# Patient Record
Sex: Female | Born: 1945 | Race: White | Hispanic: No | Marital: Married | Smoking: Never smoker
Health system: Southern US, Community
[De-identification: ages and names within clinical notes are randomized; demographics above are authoritative.]

## PROBLEM LIST (undated history)

## (undated) DIAGNOSIS — F419 Anxiety disorder, unspecified: Secondary | ICD-10-CM

## (undated) DIAGNOSIS — G629 Polyneuropathy, unspecified: Secondary | ICD-10-CM

## (undated) DIAGNOSIS — M069 Rheumatoid arthritis, unspecified: Secondary | ICD-10-CM

## (undated) DIAGNOSIS — Z9289 Personal history of other medical treatment: Secondary | ICD-10-CM

## (undated) DIAGNOSIS — M549 Dorsalgia, unspecified: Secondary | ICD-10-CM

## (undated) DIAGNOSIS — R51 Headache: Secondary | ICD-10-CM

## (undated) DIAGNOSIS — G2581 Restless legs syndrome: Secondary | ICD-10-CM

## (undated) DIAGNOSIS — K589 Irritable bowel syndrome without diarrhea: Secondary | ICD-10-CM

## (undated) DIAGNOSIS — E876 Hypokalemia: Secondary | ICD-10-CM

## (undated) DIAGNOSIS — R531 Weakness: Secondary | ICD-10-CM

## (undated) DIAGNOSIS — G35 Multiple sclerosis: Secondary | ICD-10-CM

## (undated) DIAGNOSIS — K219 Gastro-esophageal reflux disease without esophagitis: Secondary | ICD-10-CM

## (undated) DIAGNOSIS — R002 Palpitations: Secondary | ICD-10-CM

## (undated) DIAGNOSIS — E271 Primary adrenocortical insufficiency: Secondary | ICD-10-CM

## (undated) DIAGNOSIS — R350 Frequency of micturition: Secondary | ICD-10-CM

## (undated) DIAGNOSIS — G8929 Other chronic pain: Secondary | ICD-10-CM

## (undated) DIAGNOSIS — R351 Nocturia: Secondary | ICD-10-CM

## (undated) DIAGNOSIS — Z8709 Personal history of other diseases of the respiratory system: Secondary | ICD-10-CM

## (undated) DIAGNOSIS — M1990 Unspecified osteoarthritis, unspecified site: Secondary | ICD-10-CM

## (undated) DIAGNOSIS — G4700 Insomnia, unspecified: Secondary | ICD-10-CM

## (undated) DIAGNOSIS — J302 Other seasonal allergic rhinitis: Secondary | ICD-10-CM

## (undated) DIAGNOSIS — I959 Hypotension, unspecified: Secondary | ICD-10-CM

## (undated) DIAGNOSIS — M797 Fibromyalgia: Secondary | ICD-10-CM

## (undated) DIAGNOSIS — D649 Anemia, unspecified: Secondary | ICD-10-CM

## (undated) DIAGNOSIS — R42 Dizziness and giddiness: Secondary | ICD-10-CM

## (undated) DIAGNOSIS — F32A Depression, unspecified: Secondary | ICD-10-CM

## (undated) DIAGNOSIS — M2550 Pain in unspecified joint: Secondary | ICD-10-CM

## (undated) DIAGNOSIS — F329 Major depressive disorder, single episode, unspecified: Secondary | ICD-10-CM

## (undated) DIAGNOSIS — R55 Syncope and collapse: Secondary | ICD-10-CM

## (undated) HISTORY — PX: FRACTURE SURGERY: SHX138

## (undated) HISTORY — PX: SPINAL CORD STIMULATOR IMPLANT: SHX2422

## (undated) HISTORY — DX: Syncope and collapse: R55

## (undated) HISTORY — PX: EYE SURGERY: SHX253

## (undated) HISTORY — PX: ESOPHAGOGASTRODUODENOSCOPY: SHX1529

## (undated) HISTORY — PX: COLONOSCOPY: SHX174

---

## 1986-02-07 HISTORY — PX: APPENDECTOMY: SHX54

## 1986-02-07 HISTORY — PX: ABDOMINAL HYSTERECTOMY: SHX81

## 1988-02-08 HISTORY — PX: COLECTOMY: SHX59

## 1995-02-08 HISTORY — PX: CHOLECYSTECTOMY: SHX55

## 1998-05-08 ENCOUNTER — Ambulatory Visit (HOSPITAL_BASED_OUTPATIENT_CLINIC_OR_DEPARTMENT_OTHER): Admission: RE | Admit: 1998-05-08 | Discharge: 1998-05-08 | Payer: Self-pay | Admitting: General Surgery

## 1998-08-28 ENCOUNTER — Encounter: Payer: Self-pay | Admitting: Gastroenterology

## 1998-08-28 ENCOUNTER — Ambulatory Visit (HOSPITAL_COMMUNITY): Admission: RE | Admit: 1998-08-28 | Discharge: 1998-08-28 | Payer: Self-pay | Admitting: Gastroenterology

## 1998-11-17 ENCOUNTER — Ambulatory Visit (HOSPITAL_COMMUNITY): Admission: RE | Admit: 1998-11-17 | Discharge: 1998-11-17 | Payer: Self-pay | Admitting: Neurology

## 1998-12-21 ENCOUNTER — Inpatient Hospital Stay (HOSPITAL_COMMUNITY): Admission: EM | Admit: 1998-12-21 | Discharge: 1998-12-25 | Payer: Self-pay | Admitting: Emergency Medicine

## 1998-12-21 ENCOUNTER — Encounter: Payer: Self-pay | Admitting: Emergency Medicine

## 1998-12-21 ENCOUNTER — Encounter: Payer: Self-pay | Admitting: Neurology

## 1998-12-22 ENCOUNTER — Encounter: Payer: Self-pay | Admitting: Neurology

## 1999-01-15 ENCOUNTER — Encounter: Payer: Self-pay | Admitting: *Deleted

## 1999-01-15 ENCOUNTER — Encounter: Admission: RE | Admit: 1999-01-15 | Discharge: 1999-01-15 | Payer: Self-pay | Admitting: *Deleted

## 1999-02-03 ENCOUNTER — Ambulatory Visit (HOSPITAL_COMMUNITY): Admission: RE | Admit: 1999-02-03 | Discharge: 1999-02-03 | Payer: Self-pay | Admitting: *Deleted

## 1999-02-03 ENCOUNTER — Encounter: Payer: Self-pay | Admitting: *Deleted

## 1999-02-04 ENCOUNTER — Encounter: Payer: Self-pay | Admitting: Internal Medicine

## 1999-02-04 ENCOUNTER — Inpatient Hospital Stay (HOSPITAL_COMMUNITY): Admission: EM | Admit: 1999-02-04 | Discharge: 1999-02-06 | Payer: Self-pay | Admitting: *Deleted

## 1999-02-18 ENCOUNTER — Encounter: Admission: RE | Admit: 1999-02-18 | Discharge: 1999-05-19 | Payer: Self-pay | Admitting: Neurology

## 1999-03-03 ENCOUNTER — Encounter: Admission: RE | Admit: 1999-03-03 | Discharge: 1999-06-01 | Payer: Self-pay | Admitting: Anesthesiology

## 1999-03-03 ENCOUNTER — Encounter: Payer: Self-pay | Admitting: Anesthesiology

## 1999-03-10 ENCOUNTER — Encounter: Payer: Self-pay | Admitting: Anesthesiology

## 1999-05-25 ENCOUNTER — Encounter: Payer: Self-pay | Admitting: Gastroenterology

## 1999-05-25 ENCOUNTER — Encounter: Admission: RE | Admit: 1999-05-25 | Discharge: 1999-05-25 | Payer: Self-pay | Admitting: Gastroenterology

## 1999-08-24 ENCOUNTER — Encounter: Payer: Self-pay | Admitting: Gastroenterology

## 1999-08-24 ENCOUNTER — Ambulatory Visit (HOSPITAL_COMMUNITY): Admission: RE | Admit: 1999-08-24 | Discharge: 1999-08-24 | Payer: Self-pay | Admitting: Gastroenterology

## 2000-01-17 ENCOUNTER — Encounter: Payer: Self-pay | Admitting: *Deleted

## 2000-01-17 ENCOUNTER — Encounter: Admission: RE | Admit: 2000-01-17 | Discharge: 2000-01-17 | Payer: Self-pay | Admitting: *Deleted

## 2000-01-17 ENCOUNTER — Other Ambulatory Visit: Admission: RE | Admit: 2000-01-17 | Discharge: 2000-01-17 | Payer: Self-pay | Admitting: *Deleted

## 2000-07-20 ENCOUNTER — Encounter: Payer: Self-pay | Admitting: Emergency Medicine

## 2000-07-20 ENCOUNTER — Encounter: Payer: Self-pay | Admitting: *Deleted

## 2000-07-20 ENCOUNTER — Inpatient Hospital Stay (HOSPITAL_COMMUNITY): Admission: EM | Admit: 2000-07-20 | Discharge: 2000-07-27 | Payer: Self-pay | Admitting: Emergency Medicine

## 2001-01-17 ENCOUNTER — Encounter: Payer: Self-pay | Admitting: *Deleted

## 2001-01-17 ENCOUNTER — Encounter: Admission: RE | Admit: 2001-01-17 | Discharge: 2001-01-17 | Payer: Self-pay | Admitting: *Deleted

## 2002-01-17 ENCOUNTER — Encounter: Admission: RE | Admit: 2002-01-17 | Discharge: 2002-01-17 | Payer: Self-pay | Admitting: Obstetrics and Gynecology

## 2002-01-17 ENCOUNTER — Encounter: Payer: Self-pay | Admitting: Obstetrics and Gynecology

## 2002-07-24 ENCOUNTER — Ambulatory Visit (HOSPITAL_COMMUNITY): Admission: RE | Admit: 2002-07-24 | Discharge: 2002-07-24 | Payer: Self-pay | Admitting: Neurology

## 2002-07-24 ENCOUNTER — Encounter: Payer: Self-pay | Admitting: Neurology

## 2003-01-21 ENCOUNTER — Encounter: Admission: RE | Admit: 2003-01-21 | Discharge: 2003-01-21 | Payer: Self-pay | Admitting: Obstetrics and Gynecology

## 2003-06-18 ENCOUNTER — Encounter (INDEPENDENT_AMBULATORY_CARE_PROVIDER_SITE_OTHER): Payer: Self-pay | Admitting: Specialist

## 2003-06-18 ENCOUNTER — Ambulatory Visit (HOSPITAL_COMMUNITY): Admission: RE | Admit: 2003-06-18 | Discharge: 2003-06-18 | Payer: Self-pay | Admitting: Gastroenterology

## 2003-06-24 ENCOUNTER — Ambulatory Visit (HOSPITAL_COMMUNITY): Admission: RE | Admit: 2003-06-24 | Discharge: 2003-06-24 | Payer: Self-pay | Admitting: Gastroenterology

## 2004-01-22 ENCOUNTER — Encounter: Admission: RE | Admit: 2004-01-22 | Discharge: 2004-01-22 | Payer: Self-pay | Admitting: Obstetrics and Gynecology

## 2004-05-30 ENCOUNTER — Emergency Department (HOSPITAL_COMMUNITY): Admission: EM | Admit: 2004-05-30 | Discharge: 2004-05-30 | Payer: Self-pay | Admitting: Emergency Medicine

## 2004-06-25 ENCOUNTER — Encounter: Admission: RE | Admit: 2004-06-25 | Discharge: 2004-06-25 | Payer: Self-pay | Admitting: Neurology

## 2005-02-01 ENCOUNTER — Encounter: Admission: RE | Admit: 2005-02-01 | Discharge: 2005-02-01 | Payer: Self-pay | Admitting: Obstetrics and Gynecology

## 2005-09-22 ENCOUNTER — Encounter: Admission: RE | Admit: 2005-09-22 | Discharge: 2005-09-22 | Payer: Self-pay | Admitting: Gastroenterology

## 2006-02-03 ENCOUNTER — Encounter: Admission: RE | Admit: 2006-02-03 | Discharge: 2006-02-03 | Payer: Self-pay | Admitting: Obstetrics and Gynecology

## 2006-02-10 ENCOUNTER — Ambulatory Visit (HOSPITAL_COMMUNITY): Admission: RE | Admit: 2006-02-10 | Discharge: 2006-02-10 | Payer: Self-pay | Admitting: Neurology

## 2006-06-22 ENCOUNTER — Inpatient Hospital Stay (HOSPITAL_COMMUNITY): Admission: RE | Admit: 2006-06-22 | Discharge: 2006-06-24 | Payer: Self-pay | Admitting: Orthopedic Surgery

## 2006-07-21 ENCOUNTER — Inpatient Hospital Stay (HOSPITAL_COMMUNITY): Admission: AD | Admit: 2006-07-21 | Discharge: 2006-07-24 | Payer: Self-pay | Admitting: Family Medicine

## 2006-07-21 ENCOUNTER — Ambulatory Visit: Payer: Self-pay | Admitting: Family Medicine

## 2007-03-13 ENCOUNTER — Encounter: Admission: RE | Admit: 2007-03-13 | Discharge: 2007-03-13 | Payer: Self-pay | Admitting: Obstetrics and Gynecology

## 2008-03-18 ENCOUNTER — Encounter: Admission: RE | Admit: 2008-03-18 | Discharge: 2008-03-18 | Payer: Self-pay | Admitting: Obstetrics and Gynecology

## 2008-03-24 ENCOUNTER — Encounter: Admission: RE | Admit: 2008-03-24 | Discharge: 2008-03-24 | Payer: Self-pay | Admitting: Obstetrics and Gynecology

## 2008-10-23 ENCOUNTER — Encounter: Admission: RE | Admit: 2008-10-23 | Discharge: 2008-10-23 | Payer: Self-pay | Admitting: Family Medicine

## 2009-03-26 ENCOUNTER — Encounter: Admission: RE | Admit: 2009-03-26 | Discharge: 2009-03-26 | Payer: Self-pay | Admitting: Obstetrics and Gynecology

## 2010-02-27 ENCOUNTER — Other Ambulatory Visit: Payer: Self-pay | Admitting: Obstetrics and Gynecology

## 2010-02-27 DIAGNOSIS — Z1239 Encounter for other screening for malignant neoplasm of breast: Secondary | ICD-10-CM

## 2010-03-10 ENCOUNTER — Ambulatory Visit: Payer: Self-pay

## 2010-04-13 ENCOUNTER — Ambulatory Visit
Admission: RE | Admit: 2010-04-13 | Discharge: 2010-04-13 | Disposition: A | Discharge status: Home / Self Care | Payer: Commercial Indemnity | Source: Ambulatory Visit | Attending: Obstetrics and Gynecology | Admitting: Obstetrics and Gynecology

## 2010-04-13 DIAGNOSIS — Z1239 Encounter for other screening for malignant neoplasm of breast: Secondary | ICD-10-CM

## 2010-06-22 NOTE — Op Note (Signed)
[name], [name]              ACCOUNT NO.:  [id number]   MEDICAL RECORD NO.:  [id number]          PATIENT TYPE:  INP   LOCATION:  5005                         FACILITY:  [facility]   PHYSICIAN:  Doralee Albino. Carola Frost, M.D. DATE OF BIRTH:  10-Sep-1945   DATE OF PROCEDURE:  06/22/2006  DATE OF DISCHARGE:                               OPERATIVE REPORT   PREOPERATIVE DIAGNOSIS:  Right tibia and fibula malunion/nonunion.   POSTOPERATIVE DIAGNOSIS:  Right tibia and fibula malunion/nonunion.   PROCEDURE:  Intramedullary nailing of the right tibia using a Synthes 10  x 285-mm statically locked nail with correction of the deformity.   SURGEON:  Doralee Albino. Carola Frost, M.D.   ASSISTANT:  Adrian Blackwater, RNFA.   ANESTHESIA:  General.   COMPLICATIONS:  None.   SPECIMENS:  None.   ESTIMATED BLOOD LOSS:  Minimal.   DISPOSITION:  To PACU.   CONDITION:  Stable.   BRIEF SUMMARY AND INDICATIONS FOR PROCEDURE:  Katrina Rivera is a 65-  year-old female with multiple sclerosis, who sustained a right tibia  fracture that was treated conservatively.  She went on to develop  persistent mobility at the fracture site, with over 13 degrees of  recurvatum deformity.  She also was having difficulty tolerating the  cast and strongly wished to pursue operative intervention for correction  of the deformity, as well as additional internal support of the fracture  to minimize her need for cast bracing.  She understood the risks to  include nerve injury, vessel injury, infection, malunion, nonunion, need  for further surgery, DVT, PE and others.  After a full discussion, she  wished to proceed.   BRIEF DESCRIPTION OF PROCEDURE:  Katrina Rivera was taken to the  operating room, where general anesthesia was induced.  Her right lower  extremity was prepped and draped in the usual sterile fashion.  This  patient has had problems with urinary retention previously.  She  requested a Foley, which was placed prior to the  prep and drape.  We  examined the tibia under anesthesia and did find some motion at the  fracture site.  This was used to correct the deformity and hold it  reduced, while a guide wire was placed through a 2-cm incision  proximally through the curved cannulated awl.  It was advanced in the  center-center position in the metaphysis.  Her bone quality was  exceedingly poor.  We reamed and then placed a 285-mm nail that was 10  mm in diameter.  We then placed a series of locking bolts, including 2  anterior to posterior and 1 medial to lateral.  We did not achieve a lot  of bending at the fibula, which appeared to be near united.  I  consequently did not place a flexible rod.  We did apply a posterior and  a stirrup splint following internal fixation and a standard layered  closure with 2-0 Vicryl and 3-0 nylon.  The patient seemed to tolerate  the procedure quite well.  She was taken to the PACU in stable  condition.   PROGNOSIS:  Katrina Rivera should do  well following the intramedullary  nailing of her right tibia, but I remain very concerned about the severe  osteopenia, which does increase her risk of loss of reduction, hardware  migration and subsequent malunion or nonunion.  She will be  nonweightbearing for the next 4 to 6 weeks, with graduated weightbearing  thereafter.  As soon as her wounds have adequately healed, she will be  allowed to transition into a cam boot.      Doralee Albino. Carola Frost, M.D.  Electronically Signed     MHH/MEDQ  D:  06/22/2006  T:  06/22/2006  Job:  [id number]

## 2010-06-22 NOTE — H&P (Signed)
NAMERAND, BOLLER              ACCOUNT NO.:  [id number]   MEDICAL RECORD NO.:  [id number]          PATIENT TYPE:  INP   LOCATION:  5732                         FACILITY:  [facility]   PHYSICIAN:  Santiago Bumpers. Hensel, M.D.DATE OF BIRTH:  [birth year]   DATE OF ADMISSION:  07/21/2006  DATE OF DISCHARGE:                              HISTORY & PHYSICAL   PRIMARY CARE PHYSICIAN:  [facility].  The patient's neurologist  is Dr. Sandria Manly.  The patient's gastroenterologist is Dr. Ewing Schlein.  Her  urologist is Dr. Vonita Moss.   CHIEF COMPLAINT:  Pyelonephritis, flank pain, nausea, vomiting.   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  She recently underwent IM nailing of her right tibia  for malunion from a previous fracture.  Since her surgery on May 15, she  has been having urinary symptoms such as dysuria, increased frequency  and hematuria.  She was seen by her urologist and placed on a 7-day  course of Bactrim.  She has continued to have urinary symptoms, but  recently developed fever, nausea, vomiting and low back pain in addition  to the urinary symptoms about 6-7 days ago.  She presented to [facility] on June 11, and was diagnosed with pyelonephritis.  She  received ceftriaxone x2 as well as oral Cipro, but has been unable to  keep any medications down.  She also gives a history of bloody diarrhea  for the past 7-10 days.  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  She does  have a history of diarrhea previously which she does note has some times  been bloody and is followed by Dr. Ewing Schlein.  Workup for etiology of her  bleeding has been negative so far.   REVIEW OF SYSTEMS:  No chest pain, shortness breath, rashes or bleeding  gums.  She does have  some lightheadedness when first sitting up.   PAST MEDICAL HISTORY:  1. Multiple sclerosis followed by Dr. Sandria Manly.  2. Urinary retention.  Of note, the patient does self-catheterize.  3. Hypotension.  4. GERD.  5. Iron deficiency anemia.  6. Insomnia.  7. Muscle spasms.  8. Postmenopausal symptoms.   PAST SURGICAL HISTORY:  1. Right tibia IM nailing on Jun 22, 2006.  2. Cholecystectomy.  3. Appendectomy.  4. Partial hysterectomy.  5. Total colectomy with ileoanal anastomosis.   MEDICATIONS:  1. Flomax 0.4 mg q.a.m.  2. Omeprazole 20 mg p.o. b.i.d.  3. Fludrocortisone 3 mg nightly.  4. KCl 20 mEq daily.  5. Trazodone 300 mg nightly.  6. Tizanidine 4 mg every 3 hours.  7. Premarin 0.625 mg q.a.m.  8. Calcium plus D t.i.d.   ALLERGIES:  IBUPROFEN caused GI bleed.Marland Kitchen   FAMILY HISTORY:  Mom alive with hypertension.  Dad died of unknown  causes.  Siblings are alive and well.   SOCIAL HISTORY:  She lives with her husband as  well as her elderly  mother.  She has been on Disability since her multiple sclerosis  diagnosis.  She has two sons.  She denies any alcohol, tobacco or other  drug use.   PHYSICAL EXAMINATION:  VITAL SIGNS:  Temperature 99.2, pulse 131, blood  pressure 188/102, respiratory 20, 100% on room air.  GENERAL:  Alert in no acute distress.  HEENT:  Mucous membranes are somewhat dry.  NECK:  No JVD.  No lymphadenopathy.  No carotid bruits.  CARDIOVASCULAR:  Regular rate and rhythm.  No murmurs, rubs or gallops.  LUNGS:  Clear to auscultation bilaterally.  ABDOMEN:  Positive bowel sounds, soft.  She does have positive CVA  tenderness bilaterally and is mildly tender to palpation over right  lower quadrant and left lower quadrant.  No rebound or guarding.  EXTREMITIES:  Right lower extremity with healing surgical scars and mild  edema and bruising of right foot.  NEUROLOGIC:  She is alert and x3.  She has a fine resting tremor 5/5  strength with cranial nerves 2-12  grossly intact.   LABORATORY DATA AND X-RAY FINDINGS:  From [facility], urine  culture shows gram-negative rods.  Speciation is pending.  White blood  cell count went from 15.5 on June 11, to 18.0 today, hemoglobin 10.4 on  June 11, 10.7 today.  Electrolytes on June 11, with sodium 136,  potassium 4.2, chloride 105, bicarb 16, BUN 19, creatinine 1.93, glucose  95.   ASSESSMENT/PLAN:  A 65 year old female with history of multiple  sclerosis, urinary retention, now with pyelonephritis and questionable  bloody diarrhea.   1. Complicated pyelonephritis.  The patient with history of urinary      retention with lack of improvement on 2 days of ceftriaxone Cipro.      Will start Zosyn for empiric antibiotic coverage and will await      final speciation from original culture.  We will also repeat      urinalysis and urine culture.  If the patient is not improving on      parenteral antibiotics, may consider CT scan.  2. Diarrhea.  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  Question if diarrhea is bloody.      Will heme check stools as well as check C. difficile and stool      cultures to look for infectious etiology.  If symptoms worsen will      start Flagyl and consult Dr. Ewing Schlein.  3. Anemia. She has history of iron deficiency anemia.  Will monitor      hemoglobin.  Check iron panels.  The patient currently not on iron      therapy.  Anticipate that her hemoglobin will drop with intravenous      fluids.  4. Fluid and electrolytes.  The patient clinically dehydrated.  Will      give 500 mL normal saline bolus with intravenous fluids at 75 mL an      hour.  Will check electrolytes and orthostatics.  5. Protonix while in the hospital.  Will give Protonix intravenous      while she is unable to tolerate p.o. 6. Hypotension.  The patient is currently hypertensive.   We will hold      her fludrocortisone and follow her blood      pressures.  7. Muscle spasms.  Will continue the patient on her home dose of      tizanidine.  8. Acute renal failure.  We will recheck the patient's creatinine      today.  Will place Foley catheter.      Benn Moulder, M.D.    ______________________________  Santiago Bumpers. Leveda Anna, M.D.    MR/MEDQ  D:  07/21/2006  T:  07/22/2006  Job:  [id number]

## 2010-06-25 NOTE — Consult Note (Signed)
[place]  Patient:    Katrina Rivera                      MRN: [id number] Proc. Date: 12/21/98 Adm. Date:  [id number] Attending:  Erich Montane                          Consultation Report  DATE OF BIRTH:  March 21, 1945  REASON FOR CONSULTATION:  This is a 65 year old, right-handed, Caucasian, married woman with chief complaint of delirium, aphasia, chorea, organic gait disorder, and a history of relapsing-remitting multiple sclerosis.  HISTORY OF PRESENT ILLNESS:  The patient has had chronic organic gait disorder ith falling requiring a walker. She was diagnosed with multiple sclerosis years ago and has probably not had an MRI scan for at least six or seven years. 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  The patient was seen November 09, 1998, with recurrent falls. Her examination showed evidence of a systolic hypertension. The patient had a fairly normal mental status but was slow to follow commands and required them to be repeated. She had evidence of ______ disks and choreiform movement in her hands and feet bilaterally.  The patient had laboratory studies carried out by Genene Churn. Love, M.D. which showed hypokalemia, mild increased liver functions, CPK of 509, related to her falls and pancytopenia.  The patient was seen by hematology and plans were made to carry out a bone marrow harvest. Subsequent testing showed recovery of the patients blood counts. Throwing into question whether or not, the CBC was a laboratory error.  The patient was seen again by Dr. Sandria Manly on November 6. Again the patient continued to have problems with chorea, with insomnia (sleeping four hours per day), an unexpected weight loss of 15 pounds. She again had evidence of examination that was very similar to that of October 2, including  chorea, some difficulty with mental status, if mild, but no aphasia, a wide-based gait, and nonfocal examination.  The patient had been taken off of Betaseron, because it was not effective. Plans were also made to slightly taper amitriptyline, discontinue Neurontin, taper Skelaxin, and slowly taper Xanax, discontinue Zyrtec, and her TENS unit.  The patient misunderstood the instructions. She is home alone much from 9 a.m. o 7 p.m., while her husband works.  He has observed that she has been agitated, pulling things out of drawers, stacking clean and dirty laundry together. She has lost her wedding band and other rings, her glasses. She tore up her checkbook. She is speaking nonsensically but seems to be able to follow commands. Her speech worsened in the four days prior to admission.  REVIEW OF SYSTEMS:  In addition, the patients review of systems is remarkable for low-grade fever without focal infection. The patient was able to walk in from the parking lot without her walker despite her unsteadiness. She has had ongoing problems with insomnia, chorea, systolic hypertension. Review of systems is otherwise negative.  CURRENT MEDICATIONS:  Should include: 1. Baclofen 20 mg tablets three tablets q.i.d. 2. Xanax 1 mg at 8 a.m., 5 p.m., and q.h.s. 0.5 mg at 12 noon. 3. Urecholine 25 mg three times a day. 4. Amitriptyline 25 mg at bedtime. 5. Skelaxin 200 mg three times a day. 6. Prevacid 30 mg twice a day. 7. Levophed 1 mg twice  a day. 8. Estratest 1 mg per day. 9. Keflex 500 mg per day.  ALLERGIES TO MEDICINES:  Unknown. Intolerances:  IBUPROFEN. The patient received 4 units of packed red blood cells after being treated with ibuprofen.  PAST SURGICAL HISTORY:  Colectomy with ileoanal anastomosis for constipation.  SOCIAL HISTORY:  The patient lives with her husband. She has been disabled since 59. She has two sons. She does not use tobacco or alcohol.  FAMILY HISTORY:   No one with multiple sclerosis, vision problems, psychosis, or  other neurologic complaints.  PHYSICAL EXAMINATION:  GENERAL:  The patient is a well-developed, red-haired woman who is sitting in bed with choreiform movements talking frequently and agitated. Nonetheless, she was  able to remain quiet while I talked to her and followed commands for me.  VITAL SIGNS:  Blood pressure 187/84, resting pulse 114, respirations 22, temperature 101.9 degrees Fahrenheit.  HEENT:  No signs of infection in the oropharynx, conjunctivae, or tympanic membranes. No nasal discharge.  NECK:  Supple. Full range of motion. There were no cranial or cervical bruits.  LUNGS:  Clear to auscultation.  HEART:  No murmurs. Pulses normal.  ABDOMEN:  Soft, nontender. Bowel sounds normal.  EXTREMITIES:  Well-formed, without edema, cyanosis, alterations in tone or tight heel cords.  NEUROLOGICAL:  The patient is speaking in nonsequiturs. She is disoriented. She has dysnomia. There is some circumlocution. She follows one to two step commands and repeats phrases. She does not know the president though she knew that a week ago. She thought Gore and then she said Patent attorney. She cannot do simple calculations which she could do last week. Cranial nerve examination:  Round, reactive pupils. She has left afferent pupillary defect. Visual fields are full. She had symmetric facial strength. Midline tongue and uvula. Disk margins are sharp. I did not see true atrophy, but there is pallor. Extraocular movements are full. Air conduction greater than bone conduction. There may be decreased acute auditory acuity. She has symmetric facial strength. She was able to protrude her tongue and elevate her uvula in midline.  MOTOR:  She has 5/5 strength in her arms and legs. She has generalized chorea but then she stops this with volitional activity, such as reaching or testing her strength. This is only temporary,  however. Fine motor movements show good apposition of her fingers with thumb.  Sensation:  She had no focal abnormalities, stereognosis was okay once she could  put a name on the object that I asked her to identify. Deep tendon reflexes were diminished. She had bilateral flexor plantar responses.  IMPRESSION: 1. Multiple sclerosis, rule out ______ remitting, 340. 2. Aphasia, 783.4. 3. 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 4. Delirium, 293.0. 5. Fever, unknown etiology.  PLAN: 1. Admit the patient. 2. CT scan of the brain to rule out evidence of a subdural or stroke. We will need    to have an MRI scan if we are going to look at demyelination. I do not know f    she will be able to cooperate at this time. 3. We will give medications as ordered by Genene Churn. Love, M.D. a week ago. 4. We will consult psychiatry later. At present this seems as if it is an organic    brain syndrome rather than a psychosis. 5. Review of the patients laboratory shows an elevated CPK of 573 which is fairly    similar to that obtained on October 3 (509). We will recheck the CPK and the    BMET tomorrow  morning. Dr. Sandria Manly will follow this patient. I have had extensive    discussions with him concerning this patient. DD:  12/21/98 TD:  12/21/98 Job: [id number]

## 2010-06-25 NOTE — Discharge Summary (Signed)
[place]  Patient:    Katrina Rivera, Katrina Rivera                     MRN: [id number] Adm. Date:  [id number] Disc. Date: [id number] Attending:  Selina Cooley CC:         Willis Modena. Dreiling, M.D.             Genene Churn. Love, M.D.             Maretta Bees. Vonita Moss, M.D.                           Discharge Summary  DISCHARGE DIAGNOSES:  1. Severe hyponatremia with an admission serum sodium of 111, felt to be     secondary to both dehydration and ______ .  Urine sodium was elevated at     120, urine osmolality 340, serum osmolality 225, correction with free     water restriction and saline supplementation.  2. Mental status changes felt to be secondary to severe hyponatremia.  3. Dehydration secondary to nausea, vomiting and diarrhea, possibly secondary     to a viral syndrome.  4. Relapsing remitting multiple sclerosis diagnosed in 1972, under the care     of Dr. Sandria Manly.  5. Choreiform, movement disorder involving legs, arms and face.  Treated with     Benadryl this admission.  6. Organic gait disorder.  7. Scalp laceration secondary to fall prior to admission.  8. Neurogenic bladder with recurrent urinary tract infections, previously on     Urecholine, held secondary to concerns of worsening movement disorder.  9. Hypokalemia, corrected with supplementation this admission. 10. History of insomnia. 11. History of depression. 12. Status post colectomy with ______ anastomosis for constipation. 13. History of aphasia and delirium in November 2000, resulting in admission. 14. Rosacea. 15. History of seizures per chart. 16. Elevated CK, felt to be secondary to movement disorder in the 14,000 range     on admission, 500 at discharge. 17. Abnormal LFTs, to consider ultrasound if persistently abnormal.  DISCHARGE MEDICATIONS:  1. Septra double strength 1 p.o. b.i.d. x 4 days, thereafter, can resume     trimethoprim prophylactic antibiotics.  2. Remeron 15 mg p.o.  q.d.  3. Xanax 1 mg p.o. t.i.d.  4. Cytotec 200 mg p.o. q.i.d.  5. Ogen 1.25 mg p.o. q.d.  6. Prilosec 40 mg p.o. b.i.d.  7. Carafate 1 g p.o. q.d.  8. Patanol 0.1% drops OD b.i.d.  9. Baclofen 30 mg p.o. q.i.d. 10. Benadryl 25 mg p.o. t.i.d. p.r.n. for movement disorder. 11. Clarinex 5 mg po q.d. 12. Rhinocort 1 spray each nostril q.d. 13. Hyoscyamine 2 tabs p.o. t.i.d. 14. Megestrol 40 mg p.o. q.d. 15. Patient advised to hold Urecholine, given possibility of worsening     movement disorder. 16. Hold DDAVP secondary to severe hyponatremia.  Patient was advised to avoid     free water.  Follow up with Dr. Lattie Corns to be arranged within one weeks time.  Would recommend checking urinalysis and repeat culture if indicated.  Also checking BMET to insure stability of electrolytes and checking LFTs.  Follow up with Dr. Sandria Manly in August as previously arranged.  REASON FOR ADMISSION:  The patient is a 65 year old female with multiple sclerosis who had a week-long history of diarrhea, nausea and vomiting subsequently resulting in progressive confusion and increased shakes, ultimately resulting in a fall the day of admission.  The patient sustained a scalp laceration, no evidence of seizure activity was noted.  Serum sodium was noted to be markedly reduced at 112 on admission with an admission potassium of 2.1.  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  The patient did correct to a normal range with a discharge serum sodium of 136 with free water restriction.  #2 - NAUSEA, VOMITING, DIARRHEA:  Unclear etiology, symptoms resolved during hospital stay, felt to be a viral syndrome.  #3 - HYPOKALEMIA:  Again, thought to be secondary to GI losses.  The patient was repleted and remained stable.  #4 - URINARY TRACT INFECTION:  The patient has a history of  neurogenic bladder secondary to her MS with urinary retention and recurrent UTIs.  Patients urinalysis was consistent with a urinary tract infection and she was treated with Septra and will complete a 10-day course.  Subsequent to that, she will resume her usual prophylactic, trimethoprim.  #5 - ELEVATED CK:  Felt to be secondary to both volume depletion and her concurrent movement disorder.  CKs trended from the 14,000 range to the 500 range on discharge with no evidence of renal insufficiency.  #6 - CHOREIFORM:  Movement disorder felt to be secondary to multiple sclerosis, potentially exacerbated by Baclofen.  The patient was continued on Baclofen and was treated with Benadryl with some improvement in her symptoms. She was cautioned not to use Benadryl more than three times a day, given that this can worsen her urinary retention.  #7 - ABNORMAL LFTs:  With transaminases in the 1 to 200 range, unclear etiology, an acute hepatitis panel was negative, no evidence of abdominal pain or discomfort.  If persistent, would consider an ultrasound and also review of medications as potential culprits.  Patient is asymptomatic from this.  #8 - DEPRESSION:  The patient was frequently tearful and quite emotionally labile during this hospitalization.  Would consider increasing her Remeron as an outpatient. DD:  07/27/00 TD:  07/27/00 Job: [id number]

## 2010-06-25 NOTE — Consult Note (Signed)
NAMECLEO, SANTUCCI NO.:  [id number]   MEDICAL RECORD NO.:  [id number]          PATIENT TYPE:  EMS   LOCATION:  MAJO                         FACILITY:  [facility]   PHYSICIAN:  Petra Kuba, M.D.    DATE OF BIRTH:  [birth year]   DATE OF CONSULTATION:  05/30/2004  DATE OF DISCHARGE:  05/30/2004                                   CONSULTATION   HISTORY:  Patient well known to me for years of GI care who Thursday and  Friday began having constipation.  Usually she tends to have diarrhea.  May  have taken something for her diarrhea that started this. Had some increased  left lower quadrant pain.  She has not had any fever, maybe some nausea but  no vomiting.  Did take a bottle of magnesium citrate at our request which  has worked in the past for her, but she only got a little out.  When she  called me today, she requested evaluation due to persistent pain.  She has  also had some urinary problems with increased bladder spasms and does feel  like her rectum is in spasm and sees Dr. Vonita Moss tomorrow.  She did not  want to wait and see me tomorrow in the office.   PAST MEDICAL HISTORY:  1.  Total colectomy.  2.  Neurologic problems, AMS.  3.  Appendectomy.  4.  Hysterectomy.  5.  Cholecystectomy.   MEDICATIONS:  Xanax, Bactrim, Flomax, Prilosec, Ultram, fludrocortisone,  Neurontin, Librax, calcium, multivitamins, and B12, as well as Zelnorm.   ALLERGIES:  IBUPROFEN.   FAMILY HISTORY:  Not discussed.   SOCIAL HISTORY:  She lives with her husband.   REVIEW OF SYSTEMS:  No sick contacts.  Enemas and suppositories usually do  not work on her.   PHYSICAL EXAMINATION:  GENERAL:  No acute distress.  VITAL SIGNS:  Low-grade temperature of 99.9 as T-max.  LUNGS:  Clear.  HEART:  Regular rate and rhythm.  ABDOMEN:  Soft, nontender.  Good bowel sounds.   LABORATORY DATA AND OTHER STUDIES:  X-rays reviewed with Dr. Fredia Sorrow, some  constipation, no obstruction.  Not too  different from previous x-rays last  year.   Labs pertinent for white count of 3.8, hemoglobin 11.7, normal MCV and  platelet count.  Chemistries normal with potassium 3.5, normal BUN and creatinine, bicarb,  amylase, and liver tests.   ASSESSMENT:  Probable symptomatic constipation.   PLAN:  1.  Since Fleet's Phospho-Soda has helped her in the past, will go ahead and      try 1.5 mL in 8 ounces of water followed by 8 ounces of water, and she      will repeat that dosing in 4 hours if it does not help.  2.  Will try an enema and then call me back tomorrow or sooner if that is      not helpful.  If not, she will be on clear liquids, and then slowly      resume her usual diet as she improves.  The warning signs of when to  call were again discussed.      MEM/MEDQ  D:  05/30/2004  T:  05/30/2004  Job:  [id number]   cc:   Maretta Bees. Vonita Moss, M.D.  [address]  [place]  [place] [address]  Fax: [phone]

## 2010-06-25 NOTE — Discharge Summary (Signed)
NAMELIANETTE, BROUSSARD              ACCOUNT NO.:  [id number]   MEDICAL RECORD NO.:  [id number]          PATIENT TYPE:  INP   LOCATION:  5005                         FACILITY:  [facility]   PHYSICIAN:  Doralee Albino. Carola Frost, M.D. DATE OF BIRTH:  [birth year]   DATE OF ADMISSION:  06/22/2006  DATE OF DISCHARGE:  06/24/2006                               DISCHARGE SUMMARY   DISCHARGE DIAGNOSES:  Right tibia malunion/nonunion.   PROCEDURE PERFORMED:  IM nailing of right tibia with correction of  nonunion and malangulation.   ADDITIONAL DIAGNOSES:  1. Multiple sclerosis.  2. Depression.  3. Gastroesophageal reflux disease.  4. Hypotension.   BRIEF SUMMARY OF HOSPITAL COURSE:  Ms. Struve was taken to the  operating room where she underwent the procedure listed above without  complications.  She was admitted postoperatively.  She did have some  hypotension and this did give her a little bit of difficulty with bed to  chair transfers.  As such, we had to hold discharge as had been planned  for an overnight stay.  We consulted Dr. Dagoberto Ligas who was covering for Dr.  Lucianne Muss who assisted [place] with management of the patient.  She was  administered a fluid bolus and kept overnight.  He did not choose to  increase her Florinef, but did check some labs and felt that she was  stable for discharge on the following day, Jun 24, 2006.  At that time,  she was discharged with the following instructions, having pain that was  controlled on oral narcotics alone.  Ms. Folts is to remain  nonweightbearing on the right lower extremity.  She is to stay in a boot  as if it were a cast.  We will change the dressing on her followup to  clinic.   She is to resume all home medications, including:  1. Trimethoprim 100 mg daily.  2. Flomax 0.4 mg daily.  3. Prilosec 40 mg b.i.d.  4. __________ 1 mg 3 tabs q.h.s.  5. Potassium hydrochlorothiazide 20 mEq 5 tabs daily.  6. Alprazolam 0.5 mg q.h.s.  7. Trazodone 300 mg at  bedtime.  8. __________  hydrochloride 4 mg 7 tabs daily.  9. Premarin 0.625 mg q.a.m.  10.Hydrocodone APAP 10/325 three tabs daily.   She is to contact my office with any problems, concerns or questions.      Doralee Albino. Carola Frost, M.D.  Electronically Signed     MHH/MEDQ  D:  09/04/2006  T:  09/05/2006  Job:  [id number]

## 2010-06-25 NOTE — Op Note (Signed)
NAME:  Katrina Rivera, Katrina Rivera                        ACCOUNT NO.:  [id number]   MEDICAL RECORD NO.:  [id number]                   PATIENT TYPE:  AMB   LOCATION:  ENDO                                 FACILITY:  [facility]   PHYSICIAN:  Petra Kuba, M.D.                 DATE OF BIRTH:  1945-09-14   DATE OF PROCEDURE:  06/18/2003  DATE OF DISCHARGE:                                 OPERATIVE REPORT   PROCEDURE:  Esophagogastroduodenoscopy with biopsy.   INDICATIONS:  Guaiac positivity, iron deficiency, questionable etiology.   Consent was signed after risks, benefits, methods, options thoroughly  discussed multiple times in the past.   MEDICINES USED:  Demerol 80, Versed 8.   PROCEDURE:  The video endoscope was inserted by direct vision.  The  esophagus was normal.  The scope was passed into the stomach and advanced  through a normal antrum, normal pylorus, into a normal duodenal bulb and  around the C loop to a normal second portion of the duodenum.  The scope was  tried to be advanced to the third portion of the duodenum, but with looping.  We elected to stop.  We did take two duodenal biopsies to rule out  malabsorption.  The scope withdrawn back into the bulb, with a good look  there ruled out ulcers in that location.  The scope was withdrawn back to  the stomach and retroflexed.  Angularis, cardia, fundus, lesser and greater  curve were normal on retroflexed visualization.  Straight visualization to  the stomach did not reveal any additional findings.  Air was suctioned.  The  scope was slowly withdrawn.  Again, a good look at the esophagus was normal.  The scope was removed.  The patient tolerated the procedure well.  There was  no obvious immediate complication.   DIAGNOSIS:  An essentially normal esophagogastroduodenoscopy, status post  duodenal biopsy to rule out malabsorption.   PLAN:  Continue workup with a flex sigmoidoscopy.  Await CBC and biopsy and  that will dictate further  workup, plans, and recommendations.                                               Petra Kuba, M.D.    MEM/MEDQ  D:  06/18/2003  T:  06/18/2003  Job:  [id number]   cc:   Alfonse Alpers. Dagoberto Ligas, M.D.  [address]  [place]  [place] [address]  Fax: [phone]   Genene Churn. Love, M.D.  [address]  [address]  [place]  [place] [address]  Fax: [phone]

## 2010-06-25 NOTE — Procedures (Signed)
[name]  Patient:    Katrina Rivera, Katrina Rivera                     MRN: [id number] Proc. Date: 08/24/99 Adm. Date:  [id number] Attending:  Nelda Marseille CC:         Willis Modena. Dreiling, M.D.             Dr. Ambrose Pancoast in [place]                           Procedure Report  PROCEDURE:  Esophagogastroduodenoscopy and Savary dilatation.  ENDOSCOPIST:  Petra Kuba, M.D.  INDICATIONS:  The patient with weight loss and increasing dysphagia.  Consent was signed after risks, benefits, methods, and options were thoroughly discussed on multiple occasions in the office.  MEDICINES USED:  Demerol 60 and Versed 7.  DESCRIPTION OF PROCEDURE:  The video endoscope was inserted by direct vision. The esophagus was normal.  No obvious stricture or mass or abnormality was seen.  The scope was advanced into the stomach and slowly withdrawn back to 18 cm, again without any findings.  The scope was then advanced into the stomach, and advanced through a normal antrum, normal pylorus into a normal duodenal bulb, and around the cilia to a normal second portion of the duodenum.  The scope was withdrawn back to the bulb and a good look there confirmed its normal appearance.  The scope was withdrawn back into the stomach and retroflexed.  Cardia, fundus, angularis, lesser and greater curve were all normal except for some mild gastritis.  The scope was straightened and straight visualization of the stomach confirmed the gastritis.  Again, the scope was withdrawn back to 20 cm.  No additional findings were seen.  The scope was advanced to the antrum and under fluoroscopic guidance, a Savary wire was advanced with the customary J-loop being confirmed endoscopically and fluoroscopically.  The scope was removed making sure to keep the wire in the proper location.  Once the scope was removed, ________ 15 mm and then 16 mm dilators were advanced and confirmed in the proper  position in the stomach. There was minimal resistance in the passing of 15 and moderate on the 16 without any heme on either.  Once the 16 was advanced to the stomach and confirmed in the proper position under fluoroscopy, the wire was withdrawn into the dilator.  Both dilator and the wire were removed in tandem.  The procedure was terminated.  The patient tolerated the procedure well.  There was no obvious or immediate complication.  ENDOSCOPIC DIAGNOSES: 1. Normal esophagitis. 2. Minimal gastritis. 3. Otherwise normal esophagogastroduodenoscopy.  THERAPY:  Savary dilatation to 16 mm under fluoroscopy.  PLAN:  Followup in six weeks to recheck symptoms.  Based on her current weight, do not think more aggressive dilatation is worth the risks.  Will go ahead and check labs for her weight loss and have her call me sooner p.r.n. DD:  08/24/99 TD:  08/25/99 Job: [id number]

## 2010-06-25 NOTE — Discharge Summary (Signed)
NAMECOTY, STUDENT              ACCOUNT NO.:  [id number]   MEDICAL RECORD NO.:  [id number]          PATIENT TYPE:  INP   LOCATION:  5530                         FACILITY:  [facility]   PHYSICIAN:  Leighton Roach McDiarmid, M.D.DATE OF BIRTH:  1945/09/07   DATE OF ADMISSION:  07/21/2006  DATE OF DISCHARGE:  07/24/2006                               DISCHARGE SUMMARY   DISCHARGE DIAGNOSES:  1. Pyelonephritis.  2. Dehydration.  3. Anemia of chronic disease.  4. Diarrhea.  5. Gastroesophageal reflux disease.  6. Acute renal failure.  7. Multiple sclerosis.  8. Hypokalemia.  9. Neurogenic bladder.   DISCHARGE MEDICATIONS:  1. Flomax 0.4 mg every morning.  2. Omeprazole 20 mg b.i.d.  3. KCl 20 mEq daily.  4. Trazodone 300 mg before bed.  5. Tizanidine 4 mg ever 3 hours.  6. Premarin 0.625 mg ever morning.  7. Calcium Plus D 3 times daily.  8. Fludrocortisone 1.5 mg before bed.  9. Cefuroxime 500 mg twice daily for 14 days total.   LABORATORY DATA:  Creatinine on admission 1.93, creatinine on discharge  0.78.  Urine culture obtained from [facility] was positive for  E. coli.  Hemoglobin 10.7, MCV 89.  Stool was heme negative.  Blood  cultures were negative x2.  C. diff was negative x3.   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  She had failed outpatient treatment and was having  severe nausea and vomiting, and was unable to tolerate oral Cipro, so  she was admitted directly from [facility] at the [facility] for acute pyelonephritis and treatment of IV antibiotics.   HOSPITAL COURSE:  1. Pyelonephritis:  The patient was initially started on Zosyn for      empiric antibiotic coverage.  Urine culture came back positive for      E. coli, and she was transitioned to  cefepime 500 mg p.o. b.i.d.      based on sensitivities.  She was afebrile and tolerating a regular      diet at the time of discharge.  2. Diarrhea:  The patient presented with diarrhea on admission.  On      further questioning, this is a chronic issue for her, as she is      status post a total colectomy with ileoanal anastomosis.  Stools      were heme negative.  C. diff was negative x3.  She was having      normal bowel movements at the time of discharge.  3. Urinary retention:  This is secondary to multiple sclerosis.  She      is followed by Dr. Vonita Moss of urology.  She did have a Foley      catheter placed on admission, but this was discontinued prior to      discharge.  She was discharged home on Flomax, which is a home      medication for  her.  4. Acute renal failure:  Creatinine was elevated to 1.93 on admission.      This was felt to be prerenal due to dehydration.  Creatinine      improved throughout hospital course, and was 0.78 on discharge.  5. Hypokalemia:  The patient was hypokalemic throughout her hospital      course.  This was felt secondary due to dehydration and diarrhea.      Her magnesium was also low, and both magnesium and potassium were      replaced during this hospital admission, and she was with a stable      potassium at the time of discharge.  6. Dehydration:  The patient was clinically dehydrated on admission.      She was treated with IV fluids empirically.  7. Chronic hypotension:  The patient on fludrocortisone as an      outpatient medication.  When she was admitted, she was actually      hypertensive, so thus the medication was held.  Fludrocortisone was      restarted prior to discharge once her blood pressure stabilized.  8. Muscle spasms secondary to MS:  Were at baseline.  Continued      tizanidine throughout her hospital course.   DISCHARGE FOLLOWUP:  At [facility], phone number [phone] on  June 26 at 1:30 p.m.      Benn Moulder, M.D.  Electronically Signed      Leighton Roach McDiarmid, M.D.  Electronically Signed    MR/MEDQ  D:  08/24/2006  T:  08/25/2006  Job:  [id number]

## 2010-06-25 NOTE — Op Note (Signed)
NAME:  Katrina Rivera, Katrina Rivera                        ACCOUNT NO.:  [id number]   MEDICAL RECORD NO.:  [id number]                   PATIENT TYPE:  AMB   LOCATION:  ENDO                                 FACILITY:  [facility]   PHYSICIAN:  Petra Kuba, M.D.                 DATE OF BIRTH:  22-May-1945   DATE OF PROCEDURE:  06/18/2003  DATE OF DISCHARGE:                                 OPERATIVE REPORT   PROCEDURE:  Flexible sigmoidoscopy.   INDICATIONS FOR PROCEDURE:  Iron deficiency, guaiac positivity,  nondiagnostic endoscopy.   Consent was signed after risks, benefits, methods, and options were  thoroughly discussed multiple times in the past.   MEDICINES USED ADDITIONALLY:  10 of Demerol, 1 of Versed.   DESCRIPTION OF PROCEDURE:  Inspection was pertinent for small external  hemorrhoids.  Digital exam was negative. The upper endoscope used for the  endoscopy was then inserted and easily advanced past the anastomosis to 60  cm.  We did try some abdominal pressure, no blood was seen coming from  above. The scope was then slowly withdrawn. After we tried to advance  further only caused more looping and some pain. The TI that was seen was  normal. The anastomosis slightly friable but no mass lesions. The rectum was  normal. It was evaluated on straight and retroflexed visualization, the prep  was adequate.  There was some liquid stool that required washing and  suctioning. The scope was reinserted a short ways up the anastomosis which  was an end-to-end anastomosis. Air was suctioned, scope removed.  The  patient tolerated the procedure well. There was no obvious or immediate  complications.   ENDOSCOPIC DIAGNOSIS:  1. Small internal and external hemorrhoids.  2. Low sigmoid anastomosis with minimal friability.  3. Otherwise within normal limits to 60 cm up the terminal ileum without any     blood being seen.   PLAN:  Small bowel followthrough next week then will decide further workup  and  plans. Will probably recheck guaiacs and in the meantime continue iron.  She is not able to swallow the capsule endoscopy which I did show her  today which means possibly CT scan or repeating both these tests using the  pediatric colonoscope could be done if we continue to think she has  worrisome etiologies or possibly a one time CT just to rule out any  significant mass lesion.                                               Petra Kuba, M.D.    MEM/MEDQ  D:  06/18/2003  T:  06/18/2003  Job:  [id number]   cc:   Alfonse Alpers. Dagoberto Ligas, M.D.  [address]  [place]  [place] [address]  Fax: [phone]   Genene Churn. Love, M.D.  [address]  [address]  [place]  [place] [address]  Fax: [phone]

## 2010-11-24 LAB — BASIC METABOLIC PANEL
BUN: 1 — ABNORMAL LOW
BUN: 1 — ABNORMAL LOW
BUN: 1 — ABNORMAL LOW
CO2: 21
CO2: 23
CO2: 23
CO2: 26
CO2: 26
Calcium: 7.8 — ABNORMAL LOW
Calcium: 9.6
Chloride: 102
Chloride: 104
Chloride: 105
Chloride: 113 — ABNORMAL HIGH
Creatinine, Ser: 0.69
Creatinine, Ser: 0.78
Creatinine, Ser: 0.8
GFR calc Af Amer: >60
GFR calc Af Amer: >60
GFR calc non Af Amer: >60
GFR calc non Af Amer: >60
Glucose, Bld: 115 — ABNORMAL HIGH
Glucose, Bld: 122 — ABNORMAL HIGH
Glucose, Bld: 137 — ABNORMAL HIGH
Glucose, Bld: 63 — ABNORMAL LOW
Potassium: 2.9 — ABNORMAL LOW
Potassium: 4.4
Sodium: 136
Sodium: 142

## 2010-11-24 LAB — CBC
HCT: 30 — ABNORMAL LOW
Hemoglobin: 10.1 — ABNORMAL LOW
MCHC: 33.1
MCHC: 33.8
MCV: 88.8
MCV: 89.5
Platelets: 240
Platelets: 359
RBC: 3.38 — ABNORMAL LOW
RDW: 14
RDW: 14.2 — ABNORMAL HIGH
WBC: 11 — ABNORMAL HIGH

## 2010-11-24 LAB — CLOSTRIDIUM DIFFICILE EIA
C difficile Toxins A+B, EIA: NEGATIVE
C difficile Toxins A+B, EIA: NEGATIVE

## 2010-11-24 LAB — DIFFERENTIAL
Basophils Relative: 1
Eosinophils Absolute: 0.1
Eosinophils Relative: 1
Neutrophils Relative %: 81 — ABNORMAL HIGH

## 2010-11-24 LAB — PHOSPHORUS: Phosphorus: 1.9 — ABNORMAL LOW

## 2010-11-24 LAB — OCCULT BLOOD X 1 CARD TO LAB, STOOL: Fecal Occult Bld: NEGATIVE

## 2010-11-25 LAB — CULTURE, BLOOD (ROUTINE X 2): Culture: NO GROWTH

## 2010-11-25 LAB — BASIC METABOLIC PANEL
BUN: 5 — ABNORMAL LOW
Calcium: 7.4 — ABNORMAL LOW
Chloride: 108
Creatinine, Ser: 1.03
GFR calc Af Amer: >60

## 2010-11-25 LAB — DIFFERENTIAL
Basophils Absolute: 0
Basophils Relative: 0
Eosinophils Absolute: 0
Eosinophils Relative: 0
Lymphocytes Relative: 7 — ABNORMAL LOW
Lymphs Abs: 0.8
Neutro Abs: 9.1 — ABNORMAL HIGH
Neutro Abs: 9.1 — ABNORMAL HIGH
Neutrophils Relative %: 83 — ABNORMAL HIGH

## 2010-11-25 LAB — COMPREHENSIVE METABOLIC PANEL
AST: 18
BUN: 9
CO2: 17 — ABNORMAL LOW
Calcium: 8.6
Creatinine, Ser: 1.06
GFR calc Af Amer: >60
GFR calc non Af Amer: 53 — ABNORMAL LOW
Total Bilirubin: 0.8

## 2010-11-25 LAB — URINALYSIS, ROUTINE W REFLEX MICROSCOPIC
Leukocytes, UA: NEGATIVE
Nitrite: NEGATIVE
Protein, ur: NEGATIVE
Specific Gravity, Urine: 1.01 (ref 1.005–1.035)
Urobilinogen, UA: 0.2

## 2010-11-25 LAB — CBC
HCT: 30.8 — ABNORMAL LOW
MCHC: 33.1
MCHC: 33.6
MCV: 90.1
MCV: 90.2
Platelets: 185
Platelets: 198
RBC: 3.42 — ABNORMAL LOW
WBC: 10.9 — ABNORMAL HIGH

## 2010-11-25 LAB — STOOL CULTURE

## 2010-11-25 LAB — FERRITIN: Ferritin: 190 (ref 10–291)

## 2010-11-25 LAB — OCCULT BLOOD X 1 CARD TO LAB, STOOL
Fecal Occult Bld: NEGATIVE
Fecal Occult Bld: NEGATIVE

## 2010-11-25 LAB — URINE CULTURE: Colony Count: NO GROWTH

## 2010-11-25 LAB — URINE MICROSCOPIC-ADD ON

## 2010-11-25 LAB — POTASSIUM
Potassium: 2.4 — CL
Potassium: 2.7 — CL

## 2010-11-25 LAB — MAGNESIUM: Magnesium: 1.1 — ABNORMAL LOW

## 2010-11-25 LAB — GRAM STAIN

## 2010-11-25 LAB — CLOSTRIDIUM DIFFICILE EIA

## 2010-11-25 LAB — APTT: aPTT: 37

## 2010-11-25 LAB — PROTIME-INR: Prothrombin Time: 15.5 — ABNORMAL HIGH

## 2010-11-25 LAB — IRON AND TIBC

## 2011-01-20 ENCOUNTER — Other Ambulatory Visit: Payer: Self-pay | Admitting: Obstetrics and Gynecology

## 2011-01-20 DIAGNOSIS — Z1231 Encounter for screening mammogram for malignant neoplasm of breast: Secondary | ICD-10-CM

## 2011-02-14 ENCOUNTER — Ambulatory Visit: Payer: Commercial Indemnity

## 2011-02-14 DIAGNOSIS — H6980 Other specified disorders of Eustachian tube, unspecified ear: Secondary | ICD-10-CM

## 2011-02-14 DIAGNOSIS — H9209 Otalgia, unspecified ear: Secondary | ICD-10-CM

## 2011-02-23 ENCOUNTER — Ambulatory Visit: Payer: Commercial Indemnity

## 2011-02-23 DIAGNOSIS — J019 Acute sinusitis, unspecified: Secondary | ICD-10-CM

## 2011-04-26 ENCOUNTER — Ambulatory Visit
Admission: RE | Admit: 2011-04-26 | Discharge: 2011-04-26 | Disposition: A | Discharge status: Home / Self Care | Payer: Commercial Indemnity | Source: Ambulatory Visit | Attending: Obstetrics and Gynecology | Admitting: Obstetrics and Gynecology

## 2011-04-26 DIAGNOSIS — Z1231 Encounter for screening mammogram for malignant neoplasm of breast: Secondary | ICD-10-CM

## 2011-06-29 ENCOUNTER — Encounter (HOSPITAL_COMMUNITY): Payer: Self-pay | Admitting: *Deleted

## 2011-06-29 ENCOUNTER — Emergency Department (HOSPITAL_COMMUNITY)
Admission: EM | Admit: 2011-06-29 | Discharge: 2011-06-29 | Disposition: A | Discharge status: Home / Self Care | Payer: Commercial Indemnity | Attending: Emergency Medicine | Admitting: Emergency Medicine

## 2011-06-29 DIAGNOSIS — S8000XA Contusion of unspecified knee, initial encounter: Secondary | ICD-10-CM | POA: Insufficient documentation

## 2011-06-29 DIAGNOSIS — IMO0002 Reserved for concepts with insufficient information to code with codable children: Secondary | ICD-10-CM | POA: Insufficient documentation

## 2011-06-29 DIAGNOSIS — W010XXA Fall on same level from slipping, tripping and stumbling without subsequent striking against object, initial encounter: Secondary | ICD-10-CM | POA: Insufficient documentation

## 2011-06-29 DIAGNOSIS — G35 Multiple sclerosis: Secondary | ICD-10-CM | POA: Insufficient documentation

## 2011-06-29 DIAGNOSIS — IMO0001 Reserved for inherently not codable concepts without codable children: Secondary | ICD-10-CM | POA: Insufficient documentation

## 2011-06-29 DIAGNOSIS — W19XXXA Unspecified fall, initial encounter: Secondary | ICD-10-CM

## 2011-06-29 DIAGNOSIS — T07XXXA Unspecified multiple injuries, initial encounter: Secondary | ICD-10-CM

## 2011-06-29 DIAGNOSIS — T148XXA Other injury of unspecified body region, initial encounter: Secondary | ICD-10-CM

## 2011-06-29 DIAGNOSIS — Y9229 Other specified public building as the place of occurrence of the external cause: Secondary | ICD-10-CM | POA: Insufficient documentation

## 2011-06-29 DIAGNOSIS — Z23 Encounter for immunization: Secondary | ICD-10-CM | POA: Insufficient documentation

## 2011-06-29 HISTORY — DX: Fibromyalgia: M79.7

## 2011-06-29 HISTORY — DX: Multiple sclerosis: G35

## 2011-06-29 HISTORY — DX: Restless legs syndrome: G25.81

## 2011-06-29 HISTORY — DX: Unspecified osteoarthritis, unspecified site: M19.90

## 2011-06-29 MED ORDER — OXYCODONE-ACETAMINOPHEN 5-325 MG PO TABS
2.0000 | ORAL_TABLET | Freq: Once | ORAL | Status: AC
  Administered 2011-06-29: 2 via ORAL
  Filled 2011-06-29: qty 2

## 2011-06-29 MED ORDER — OXYCODONE-ACETAMINOPHEN 5-325 MG PO TABS: 1.0000 | ORAL_TABLET | Freq: Four times a day (QID) | ORAL | Status: DC | PRN

## 2011-06-29 MED ORDER — TETANUS-DIPHTHERIA TOXOIDS TD 5-2 LFU IM INJ
0.5000 mL | INJECTION | Freq: Once | INTRAMUSCULAR | Status: AC
  Administered 2011-06-29: 0.5 mL via INTRAMUSCULAR
  Filled 2011-06-29 (×2): qty 0.5

## 2011-06-29 NOTE — ED Notes (Signed)
Here s/p fall, tripped by hose, fell onto concrete, c/o bilateral arm pain (shoulder to hand), and bilateral knee pain. Abrasion noted to L knee. Occurred about ~ 1845, no meds PTA. Here with family, pt alert, NAD, calm, interactive, skin W&D, resps e/u, speaking in clear complete sentences. (denies: head, neck or back pain; denies: vd, dizziness, fever, recent sickness), admits to some nausea & h/o fibromyalgia, restless legs & MS.

## 2011-06-29 NOTE — ED Provider Notes (Signed)
Medical screening examination/treatment/procedure(s) were performed by non-physician practitioner and as supervising physician I was immediately available for consultation/collaboration.   Loren Racer, MD 06/29/11 [phone]

## 2011-06-29 NOTE — ED Provider Notes (Signed)
History     CSN: [id number]  Arrival date & time 06/29/11  2025   First MD Initiated Contact with Patient 06/29/11 2124      Chief Complaint  Patient presents with  . Fall  . Arm Pain    (Consider location/radiation/quality/duration/timing/severity/associated sxs/prior treatment) HPI Comments: Patient states she was at [facility] [employer] shopping went to step over a hose when the, personality other than as told that tripping her.  She fell forward onto her knees, and catching herself with an outstretched hands.  She has a small laceration to her the anterior portion of her left knee, with bruising, but full range of motion of both lower kidneys.  She also has pain mid forearm bilaterally, without abrasions to the palms of her hands.  No deformities.  Full range of motion at the elbow and the rest.  He did not hit her head, chest, pelvis, is stable.  She is ambulatory at this time  The history is provided by the patient.    Past Medical History  Diagnosis Date  . Fibromyalgia   . Restless leg syndrome   . Multiple sclerosis   . Arthritis   . Osteoporosis     Past Surgical History  Procedure Date  . Fracture surgery   . Appendectomy   . Colectomy   . Cholecystectomy   . Cesarean section   . Abdominal hysterectomy     Family History  Problem Relation Age of Onset  . Hypertension Mother     History  Substance Use Topics  . Smoking status: Never Smoker   . Smokeless tobacco: Not on file  . Alcohol Use: No    OB History    Grav Para Term Preterm Abortions TAB SAB Ect Mult Living                  Review of Systems  Eyes: Negative for visual disturbance.  Musculoskeletal: Positive for arthralgias. Negative for back pain and joint swelling.  Skin: Positive for wound.  Neurological: Negative for dizziness, weakness and headaches.    Allergies  Review of patient's allergies indicates no known allergies.  Home Medications   Current Outpatient Rx  Name Route Sig Dispense  Refill  . ACETAMINOPHEN ER 650 MG PO TBCR Oral Take 650 mg by mouth 2 (two) times daily.    Marland Kitchen ALPRAZOLAM 0.5 MG PO TABS Oral Take 0.5 mg by mouth at bedtime.    . B COMPLEX PO TABS Oral Take 1 tablet by mouth daily.    . BETHANECHOL CHLORIDE 25 MG PO TABS Oral Take 25 mg by mouth 2 (two) times daily.    Marland Kitchen ALIGN PO CAPS Oral Take 1 capsule by mouth daily.    Marland Kitchen CALCIUM CARBONATE-VITAMIN D 500-200 MG-UNIT PO TABS Oral Take 1 tablet by mouth 2 (two) times daily.    Marland Kitchen FERROUS SULFATE 325 (65 FE) MG PO TABS Oral Take 325 mg by mouth daily with breakfast.    . FLUDROCORTISONE ACETATE 0.1 MG PO TABS Oral Take 0.1-0.2 mg by mouth 2 (two) times daily. Take 1 tablet in the morning and 1 tablet at bedtime.    Marland Kitchen GABAPENTIN 300 MG PO CAPS Oral Take 100-300 mg by mouth 3 (three) times daily. Take 1 capsule in the morning, 1 capsule at lunch, and 2 capsules at bedtime.    Marland Kitchen LANSOPRAZOLE 30 MG PO CPDR Oral Take 30 mg by mouth daily.    . ADULT MULTIVITAMIN W/MINERALS CH Oral Take 1 tablet by mouth  daily.    . OMEGA-3-ACID ETHYL ESTERS 1 G PO CAPS Oral Take 1 g by mouth 2 (two) times daily.    Marland Kitchen POTASSIUM CHLORIDE ER 10 MEQ PO TBCR Oral Take 20-30 mEq by mouth 2 (two) times daily. Take 2 tablets in the morning and 3 tablets at bedtime.    Marland Kitchen ROPINIROLE HCL ER 4 MG PO TB24 Oral Take 4 mg by mouth at bedtime.    . TAMSULOSIN HCL 0.4 MG PO CAPS Oral Take 0.4 mg by mouth daily after breakfast.    . TRIMETHOPRIM 100 MG PO TABS Oral Take 100 mg by mouth at bedtime.    Marland Kitchen VITAMIN D (CHOLECALCIFEROL) PO Oral Take 10,000 Units by mouth daily.    Marland Kitchen VITAMIN E 400 UNITS PO CAPS Oral Take 400 Units by mouth daily.    . OXYCODONE-ACETAMINOPHEN 5-325 MG PO TABS Oral Take 1 tablet by mouth every 6 (six) hours as needed for pain. 10 tablet 0    BP 170/69  Pulse 83  Temp(Src) 99 F (37.2 C) (Oral)  Resp 16  SpO2 99%  Physical Exam  Constitutional: She is oriented to person, place, and time. She appears well-developed.    HENT:  Head: Normocephalic.  Eyes: Pupils are equal, round, and reactive to light.  Neck: Normal range of motion.  Cardiovascular: Normal rate.   Pulmonary/Chest: Effort normal.  Abdominal: Soft. She exhibits no distension. There is no tenderness.  Musculoskeletal: Normal range of motion. She exhibits tenderness. She exhibits no edema.       Arms:      Legs: Neurological: She is alert and oriented to person, place, and time.  Skin: No rash noted. No erythema. No pallor.       See description.  Under musculoskeletal    ED Course  Procedures (including critical care time)  Labs Reviewed - No data to display No results found.   1. Fall   2. Multiple contusions   3. Abrasion       MDM  Mechanical fall, with bruising to bilateral knees, and bilateral forearm pain without deformity.  Will update tetanus status       Arman Filter, NP 06/29/11 2155  Arman Filter, NP 06/29/11 2155  Arman Filter, NP 06/29/11 2155

## 2011-06-29 NOTE — ED Notes (Signed)
Pt alert and oriented, with steady gait at time of discharge. Pt given discharge papers and papers explained. All questions answered and pt wheeled to discharge.  

## 2011-06-29 NOTE — Discharge Instructions (Signed)
Abrasions Abrasions are skin scrapes. Their treatment depends on how large and deep the abrasion is. Abrasions do not extend through all layers of the skin. A cut or lesion through all skin layers is called a laceration. HOME CARE INSTRUCTIONS   If you were given a dressing, change it at least once a day or as instructed by your caregiver. If the bandage sticks, soak it off with a solution of water or hydrogen peroxide.   Twice a day, wash the area with soap and water to remove all the cream/ointment. You may do this in a sink, under a tub faucet, or in a shower. Rinse off the soap and pat dry with a clean towel. Look for signs of infection (see below).   Reapply cream/ointment according to your caregiver's instruction. This will help prevent infection and keep the bandage from sticking. Telfa or gauze over the wound and under the dressing or wrap will also help keep the bandage from sticking.   If the bandage becomes wet, dirty, or develops a foul smell, change it as soon as possible.   Only take over-the-counter or prescription medicines for pain, discomfort, or fever as directed by your caregiver.  SEEK IMMEDIATE MEDICAL CARE IF:   Increasing pain in the wound.   Signs of infection develop: redness, swelling, surrounding area is tender to touch, or pus coming from the wound.   You have a fever.   Any foul smell coming from the wound or dressing.  Most skin wounds heal within ten days. Facial wounds heal faster. However, an infection may occur despite proper treatment. You should have the wound checked for signs of infection within 24 to 48 hours or sooner if problems arise. If you were not given a wound-check appointment, look closely at the wound yourself on the second day for early signs of infection listed above. MAKE SURE YOU:   Understand these instructions.   Will watch your condition.   Will get help right away if you are not doing well or get worse.  Document Released:  11/03/2004 Document Revised: 01/13/2011 Document Reviewed: 12/28/2010 [employer] Patient Information 2012 [employer].Contusion A contusion is a deep bruise. Contusions are the result of an injury that caused bleeding under the skin. The contusion may turn blue, purple, or yellow. Minor injuries will give you a painless contusion, but more severe contusions may stay painful and swollen for a few weeks.  CAUSES  A contusion is usually caused by a blow, trauma, or direct force to an area of the body. SYMPTOMS   Swelling and redness of the injured area.   Bruising of the injured area.   Tenderness and soreness of the injured area.   Pain.  DIAGNOSIS  The diagnosis can be made by taking a history and physical exam. An X-ray, CT scan, or MRI may be needed to determine if there were any associated injuries, such as fractures. TREATMENT  Specific treatment will depend on what area of the body was injured. In general, the best treatment for a contusion is resting, icing, elevating, and applying cold compresses to the injured area. Over-the-counter medicines may also be recommended for pain control. Ask your caregiver what the best treatment is for your contusion. HOME CARE INSTRUCTIONS   Put ice on the injured area.   Put ice in a plastic bag.   Place a towel between your skin and the bag.   Leave the ice on for 15 to 20 minutes, 3 to 4 times a day.     Only take over-the-counter or prescription medicines for pain, discomfort, or fever as directed by your caregiver. Your caregiver may recommend avoiding anti-inflammatory medicines (aspirin, ibuprofen, and naproxen) for 48 hours because these medicines may increase bruising.   Rest the injured area.   If possible, elevate the injured area to reduce swelling.  SEEK IMMEDIATE MEDICAL CARE IF:   You have increased bruising or swelling.   You have pain that is getting worse.   Your swelling or pain is not relieved with medicines.  MAKE  SURE YOU:   Understand these instructions.   Will watch your condition.   Will get help right away if you are not doing well or get worse.  Document Released: 11/03/2004 Document Revised: 01/13/2011 Document Reviewed: 11/29/2010 [facility] Patient Information 2012 [place], [place]. Your tetanus immunization was updated at this time

## 2011-07-01 ENCOUNTER — Ambulatory Visit: Payer: Commercial Indemnity | Admitting: Family Medicine

## 2011-07-01 ENCOUNTER — Ambulatory Visit: Payer: Commercial Indemnity

## 2011-07-01 ENCOUNTER — Encounter: Payer: Self-pay | Admitting: Family Medicine

## 2011-07-01 VITALS — BP 176/88 | HR 82 | Temp 98.2°F | Resp 16 | Ht 60.0 in | Wt 128.0 lb

## 2011-07-01 DIAGNOSIS — M25531 Pain in right wrist: Secondary | ICD-10-CM

## 2011-07-01 DIAGNOSIS — S80212A Abrasion, left knee, initial encounter: Secondary | ICD-10-CM

## 2011-07-01 DIAGNOSIS — M25532 Pain in left wrist: Secondary | ICD-10-CM

## 2011-07-01 DIAGNOSIS — S81009A Unspecified open wound, unspecified knee, initial encounter: Secondary | ICD-10-CM

## 2011-07-01 DIAGNOSIS — M25562 Pain in left knee: Secondary | ICD-10-CM

## 2011-07-01 DIAGNOSIS — S81809A Unspecified open wound, unspecified lower leg, initial encounter: Secondary | ICD-10-CM

## 2011-07-01 DIAGNOSIS — IMO0002 Reserved for concepts with insufficient information to code with codable children: Secondary | ICD-10-CM

## 2011-07-01 DIAGNOSIS — M25539 Pain in unspecified wrist: Secondary | ICD-10-CM

## 2011-07-01 DIAGNOSIS — M25569 Pain in unspecified knee: Secondary | ICD-10-CM

## 2011-07-01 MED ORDER — OXYCODONE-ACETAMINOPHEN 7.5-325 MG PO TABS: ORAL_TABLET | ORAL | Status: DC

## 2011-07-01 NOTE — Progress Notes (Signed)
Subjective: On Wednesday the patient was walking to lawn and garden at the [employer] when she tripped over a hose. She was in the process of stepping on a hose when they lady using it for watery muster pulled on it and elevated it some scar from her and she fell forward hard on her both hands and both knees. The patient has a history of MS, which doesn't help. She went to the common emergency room since we're closed. She was not x-rayed. She did have an abrasion Arlette left knee. She is continued to hurt and be unsteady so she came in for a recheck. She has trouble using her hands because of the pain.  Emergency room gave some Percocet 5/325. She has a history of having been on pain medicines in the past for fibromyalgia and for a broken leg for a long time, so is somewhat resistant to the medications. She would still like something more for pain to  Objective: Abrasion left knee right over the kneecap. It is a little pussy looking. No other major skin abnormalities except for a tiny abrasion on the right knee. Both wrists are tender with pain on movement. The right seems worse than the left. There is more tenderness in the right forearm. A little bit tender up in the upper right arm. Both knees are tender though patella [place] feel like they are intact. There is no joint effusion palpable. She is able to ambulate but is very weak and unsteady. I'm sure that the MS is part of that gait disturbance but a lot of it is just severe pain she is having.  Assessment: Pain right wrist Pain left wrist Pain right knee Pain left knee Abrasion left knee Osteoporosis   Plan: We'll get x-rays of both wrists and of the left knee. I believe the right knee is okay.  [facility] reading (PRIMARY) by  Dr. Alwyn Ren No fx.  Osteoporosis.  Percocet Time Dressed left knee

## 2011-07-01 NOTE — Patient Instructions (Signed)
Return if problems

## 2011-07-03 ENCOUNTER — Telehealth: Payer: Self-pay

## 2011-07-03 NOTE — Telephone Encounter (Signed)
PT FELL AT [facility], SHE IS AWARE THAT HER XRAY DID NOT SHOW FRACTURES, BUT SHE STILL IS UNCOMFORTABLE AND WANTS TO SPEAK TO CLINICAL.

## 2011-07-05 NOTE — Telephone Encounter (Signed)
Pt stated that she is really not feeling any better yet, but knows it will take some time for soreness to resolve. She is having trouble raising her arms above a certain point and turning door knobs. Also having trouble w/getting up from sitting position d/t knee pain, and having trouble sleeping bc she can't get comfortable. Pt agrees to RTC if she doesn't start to improve in the next couple of days, or sooner if worsens.

## 2011-07-06 NOTE — Telephone Encounter (Signed)
Pt CB and stated she is just not improving at all yet and wonders if she should get an MRI. Pt reports that her pain is still esp in arms and wrists, some in knee also. Advised pt that she should RTC for re-eval if she has had no improvement. Pt agreed and will try to get in this evening.

## 2011-07-07 ENCOUNTER — Ambulatory Visit (INDEPENDENT_AMBULATORY_CARE_PROVIDER_SITE_OTHER): Payer: Commercial Indemnity | Admitting: Family Medicine

## 2011-07-07 VITALS — BP 170/79 | HR 74 | Temp 98.4°F | Resp 16 | Ht 60.75 in | Wt 123.2 lb

## 2011-07-07 DIAGNOSIS — G35 Multiple sclerosis: Secondary | ICD-10-CM

## 2011-07-07 DIAGNOSIS — M25539 Pain in unspecified wrist: Secondary | ICD-10-CM

## 2011-07-07 DIAGNOSIS — M25569 Pain in unspecified knee: Secondary | ICD-10-CM

## 2011-07-07 DIAGNOSIS — R29898 Other symptoms and signs involving the musculoskeletal system: Secondary | ICD-10-CM

## 2011-07-07 MED ORDER — OXYCODONE-ACETAMINOPHEN 5-325 MG PO TABS: 2.0000 | ORAL_TABLET | Freq: Four times a day (QID) | ORAL | Status: AC | PRN

## 2011-07-07 NOTE — Progress Notes (Signed)
  Subjective:    Patient ID: Katrina Rivera, female    DOB: [birth year], 66 y.o.   MRN: [id number]  HPI Katrina Rivera is a 66 y.o. female Fall in [place] on 06/29/11  Tripped on hose.  Fell onto both knees and forearms. Seen in ER 5/22, then here on 5/24.  Xrays of both wrists were negative, L knee XR - no definite fracture, mild degenerative spurring. Rx percocet last office visit.  Increased from 5/325 to 7.5/325Taking percocet every 4 hours.  Icing for up to 45 minutes at a time.    Now having more pain past few days - in muscles all of arms - every part of arm from shoulder down to hands.  Trouble raising arms - pain with lifiting overhead or behind back.  No neck pain or shoulder pain.   Trouble with gripping objects - feels weak.  Has had carpal tunnel in past in R arm, but this feels different.  Trouble sleeping due to pain in arms.   L knee feels worse since last office visit - more pain in L knee cap.  Pain with moving it.    Hx of MS- but prior to injury, was able to walk on own and full use of arms and legs.     Review of Systems  Constitutional: Negative for fever and chills.  Respiratory: Negative for shortness of breath.   Musculoskeletal: Positive for myalgias.  Hematological:       Bruising on L lower leg, R knee, R forearm.       Objective:   Physical Exam  Constitutional: She is oriented to person, place, and time. She appears well-developed and well-nourished.  HENT:  Head: Normocephalic and atraumatic.  Neck: Normal range of motion. Neck supple.       c spine nontender, full pain free rom.    Pulmonary/Chest: Effort normal and breath sounds normal.  Musculoskeletal:       Right elbow: Normal.She exhibits normal range of motion. no tenderness found. No radial head, no medial epicondyle, no lateral epicondyle and no olecranon process tenderness noted.       Left elbow: Normal. no tenderness found. No radial head, no medial epicondyle, no lateral epicondyle and no  olecranon process tenderness noted.       Right wrist: She exhibits decreased range of motion.       Left knee: She exhibits decreased range of motion.       Arms:      Legs:      R and L Shoulders and cspine nontender, but complains of pains into arms when elevating shoulders (flexion) greater than 90degrees.  No ac or clavicle ttp.  Guarded exam at wrists.  No bony ttp, but pain with motion, twisting,    Equal grip strength bilaterally.   Neurological: She is alert and oriented to person, place, and time.      Assessment & Plan:  Katrina Rivera is a 66 y.o. female Status post fall 5/22, see recent evals and xray reports - no identifiable fracture.  Hx of MS.  Now with c/o of increasing arm and knee pain, with subjective weakness 8 days post injury. Wrist braces prn, gentle rom Ace wrap to knee prn.  Continue ice/heat prn.  Increase percocet 5/325 to 2 every 6 hours as needed.  refer to ortho for eval next 5 days  - rtc or to ER sooner if any worsening of symptoms. Understanding expressed.

## 2011-07-08 ENCOUNTER — Telehealth: Payer: Self-pay

## 2011-07-08 NOTE — Telephone Encounter (Signed)
May need to either wrap slightly tighter, or larger ace wrap.  Can purchase larger Ace wrap otc or can try otc neoprene sleeve until seen by ortho.

## 2011-07-08 NOTE — Telephone Encounter (Signed)
Pt called and stated that the ace wrap will not stay up on her knee. It came down after we wrapped it and every time they have tried to re-wrap. Is there any other form of sleeve or other ortho device that might stay in place better?

## 2011-07-10 NOTE — Telephone Encounter (Signed)
ADVISED PT OF NOTE FROM DR Neva Seat

## 2011-07-26 ENCOUNTER — Encounter (INDEPENDENT_AMBULATORY_CARE_PROVIDER_SITE_OTHER): Payer: Commercial Indemnity

## 2011-07-26 ENCOUNTER — Other Ambulatory Visit: Payer: Self-pay | Admitting: *Deleted

## 2011-07-26 DIAGNOSIS — M79609 Pain in unspecified limb: Secondary | ICD-10-CM

## 2011-07-26 DIAGNOSIS — R609 Edema, unspecified: Secondary | ICD-10-CM

## 2011-08-04 ENCOUNTER — Telehealth: Payer: Self-pay

## 2011-08-04 NOTE — Telephone Encounter (Signed)
PT REQUESTING RX FOR APPETITE INCREASE,CANNOT EAT SINCE TAKING BAD FALL(BROKEN ELBOWS,KNEE CAP)AND IS CONCERNED.   BEST PHONE [phone]  [facility]

## 2011-08-05 NOTE — Telephone Encounter (Signed)
Patient needs to come in to discuss this.

## 2011-08-05 NOTE — Telephone Encounter (Signed)
Patient notified and stated its hard to come in with two broke elbows and a knee cap. She said she would just call someone else who can do this for her.

## 2012-02-07 ENCOUNTER — Other Ambulatory Visit: Payer: Self-pay | Admitting: Gastroenterology

## 2012-02-07 DIAGNOSIS — R109 Unspecified abdominal pain: Secondary | ICD-10-CM

## 2012-02-08 DIAGNOSIS — Z0271 Encounter for disability determination: Secondary | ICD-10-CM

## 2012-02-13 ENCOUNTER — Ambulatory Visit
Admission: RE | Admit: 2012-02-13 | Discharge: 2012-02-13 | Disposition: A | Discharge status: Home / Self Care | Payer: Commercial Indemnity | Source: Ambulatory Visit | Attending: Gastroenterology | Admitting: Gastroenterology

## 2012-02-13 DIAGNOSIS — R109 Unspecified abdominal pain: Secondary | ICD-10-CM

## 2012-02-13 MED ORDER — IOHEXOL 300 MG/ML  SOLN
100.0000 mL | Freq: Once | INTRAMUSCULAR | Status: AC | PRN
  Administered 2012-02-13: 100 mL via INTRAVENOUS

## 2012-03-19 ENCOUNTER — Other Ambulatory Visit: Payer: Self-pay | Admitting: Obstetrics and Gynecology

## 2012-03-19 DIAGNOSIS — Z1231 Encounter for screening mammogram for malignant neoplasm of breast: Secondary | ICD-10-CM

## 2012-04-27 ENCOUNTER — Ambulatory Visit
Admission: RE | Admit: 2012-04-27 | Discharge: 2012-04-27 | Disposition: A | Discharge status: Home / Self Care | Payer: Commercial Indemnity | Source: Ambulatory Visit | Attending: Obstetrics and Gynecology | Admitting: Obstetrics and Gynecology

## 2012-04-27 DIAGNOSIS — Z1231 Encounter for screening mammogram for malignant neoplasm of breast: Secondary | ICD-10-CM

## 2012-08-15 ENCOUNTER — Other Ambulatory Visit: Payer: Self-pay | Admitting: Endocrinology

## 2012-08-15 ENCOUNTER — Ambulatory Visit (INDEPENDENT_AMBULATORY_CARE_PROVIDER_SITE_OTHER): Payer: Medicare Other | Admitting: Neurology

## 2012-08-15 ENCOUNTER — Other Ambulatory Visit (INDEPENDENT_AMBULATORY_CARE_PROVIDER_SITE_OTHER): Payer: Medicare Other

## 2012-08-15 ENCOUNTER — Other Ambulatory Visit: Payer: Commercial Indemnity

## 2012-08-15 ENCOUNTER — Encounter: Payer: Self-pay | Admitting: Neurology

## 2012-08-15 VITALS — BP 171/76 | HR 80 | Temp 98.2°F | Ht 61.0 in | Wt 124.0 lb

## 2012-08-15 DIAGNOSIS — E2740 Unspecified adrenocortical insufficiency: Secondary | ICD-10-CM

## 2012-08-15 DIAGNOSIS — G35D Multiple sclerosis, unspecified: Secondary | ICD-10-CM | POA: Insufficient documentation

## 2012-08-15 DIAGNOSIS — E2749 Other adrenocortical insufficiency: Secondary | ICD-10-CM

## 2012-08-15 DIAGNOSIS — E038 Other specified hypothyroidism: Secondary | ICD-10-CM

## 2012-08-15 DIAGNOSIS — G35 Multiple sclerosis: Secondary | ICD-10-CM

## 2012-08-15 DIAGNOSIS — F411 Generalized anxiety disorder: Secondary | ICD-10-CM

## 2012-08-15 DIAGNOSIS — G894 Chronic pain syndrome: Secondary | ICD-10-CM

## 2012-08-15 LAB — BASIC METABOLIC PANEL
CO2: 32 mEq/L (ref 19–32)
Calcium: 9.8 mg/dL (ref 8.4–10.5)
Chloride: 102 mEq/L (ref 96–112)
Potassium: 4.8 mEq/L (ref 3.5–5.1)
Sodium: 139 mEq/L (ref 135–145)

## 2012-08-15 MED ORDER — GABAPENTIN 300 MG PO CAPS: ORAL_CAPSULE | ORAL | Status: DC

## 2012-08-15 MED ORDER — TRAMADOL HCL 50 MG PO TABS: 100.0000 mg | ORAL_TABLET | Freq: Two times a day (BID) | ORAL | Status: DC

## 2012-08-15 MED ORDER — ALPRAZOLAM 0.5 MG PO TABS: 0.5000 mg | ORAL_TABLET | Freq: Every day | ORAL | Status: DC

## 2012-08-15 NOTE — Progress Notes (Signed)
Subjective:    [name] is a 67 y.o. female.  HPI  Interim history:   Katrina Rivera is a very pleasant 67 year old right-handed woman who presents for followup consultation after Dr. Imagene Rivera retirement. Katrina Rivera is accompanied by Katrina Rivera Rivera today. Katrina Rivera was diagnosed with MS in 1984. Katrina Rivera followed him for a history of multiple sclerosis, associated with neurogenic bladder, gait disorder. Katrina Rivera had total colectomy with small bowel pull through in 1991 so Katrina Rivera did not need a stoma. Katrina Rivera was last seen by Dr. love on 04/12/2012, and which time he felt that Katrina Rivera was doing well. Katrina Rivera has a complex underlying history of chronic pain with history of complex regional pain syndrome of Katrina left foot and leg, right leg fracture, status post surgery with hardware in place, depression, anxiety, gastroparesis, RLS, tremor, postural hypotension, history of left eye optic neuritis, multiple sclerosis and fall due to gait disorder. Katrina Rivera's currently on Advil as needed, estradiol, prednisone, tramadol, gabapentin, Xanax, Flomax, trimethoprim, Prevacid, calcium, vitamin D, multivitamin, fludrocortisone, Klor-Con.   Katrina Rivera reviewed Dr. Imagene Rivera prior notes and Katrina Rivera's records and below is a summary of that review:  67 year old right-handed woman with history of multiple sclerosis, esophageal strictures, gastroparesis, colectomy and small bowel pull-through, neurogenic bladder, gait disorder chronic pain, complex regional pain syndrome, right leg fracture with surgery sleep issues, tremors, RLS. Katrina Rivera also carries a diagnosis of fibromyalgia. Katrina Rivera fell in May 2013 fracture in both elbows and Katrina Rivera left kneecap. Katrina Rivera had postural hypotension and is on Florinef including prednisone. Katrina Rivera intermittently has been using a cane or walker.  Katrina Rivera could not tolerate Requip d/t nausea. Katrina Rivera has been on gabapentin 300 mg 2 bid. Katrina Rivera gained significant wt when on Lyrica and therefore was switched back to gabapentin. Katrina Rivera has OH and is  on midodrine 2.5 mg and fludrocortisone 0.1 mg 1 in AM and 2 at night, and Katrina Rivera is also on 20 mg of hydrocortisone. Katrina Rivera has not been on Neupro patch.   Katrina Rivera Past Medical History Is Significant For: Past Medical History  Diagnosis Date  . Fibromyalgia   . Restless leg syndrome   . Multiple sclerosis   . Arthritis   . Osteoporosis     Katrina Rivera Past Surgical History Is Significant For: Past Surgical History  Procedure Laterality Date  . Fracture surgery    . Appendectomy    . Colectomy    . Cholecystectomy    . Cesarean section    . Abdominal hysterectomy      Katrina Rivera Family History Is Significant For: Family History  Problem Relation Age of Onset  . Hypertension Mother   . Stroke Mother   . Heart attack Father     Katrina Rivera Social History Is Significant For: History   Social History  . Marital Status: Married    Spouse Name: Katrina Rivera    Number of Children: 2  . Years of Education: [employer].   Occupational History  .      Disabled   Social History Main Topics  . Smoking status: Never Smoker   . Smokeless tobacco: Never Used  . Alcohol Use: No  . Drug Use: No  . Sexually Active: None   Other Topics Concern  . None   Social History Narrative   Pt lives at home with spouse.   Caffeine Use: 2-3 cups daily.    Katrina Rivera Allergies Are:  No Known Allergies:   Katrina Rivera Current Medications Are:  Outpatient Encounter Prescriptions as of 08/15/2012  Medication Sig Dispense Refill  . acetaminophen (TYLENOL) 650 MG CR tablet Take 650 mg by mouth 2 (two) times daily.      Marland Kitchen ALPRAZolam (XANAX) 0.5 MG tablet Take 0.5 mg by mouth at bedtime.      Marland Kitchen b complex vitamins tablet Take 1 tablet by mouth daily.      . bethanechol (URECHOLINE) 25 MG tablet Take 25 mg by mouth 2 (two) times daily.      . bifidobacterium infantis (ALIGN) capsule Take 1 capsule by mouth daily.      . calcium-vitamin D (OSCAL WITH D) 500-200 MG-UNIT per tablet Take 1 tablet by mouth 2 (two) times daily.      . cephALEXin (KEFLEX)  500 MG capsule Take 1 capsule by mouth as needed.      . fludrocortisone (FLORINEF) 0.1 MG tablet Take 0.1-0.2 mg by mouth 2 (two) times daily. Take 1 tablet in Katrina morning and 1 tablet at bedtime.      . gabapentin (NEURONTIN) 300 MG capsule Take 100-300 mg by mouth 3 (three) times daily. Take 1 capsule in Katrina morning, 1 capsule at lunch, and 2 capsules at bedtime.      . hydrocortisone (CORTEF) 10 MG tablet Take 1 tablet by mouth 2 (two) times daily.      . lansoprazole (PREVACID) 30 MG capsule Take 30 mg by mouth daily.      . midodrine (PROAMATINE) 2.5 MG tablet Take 1 tablet by mouth 2 (two) times daily.      . Multiple Vitamin (MULITIVITAMIN WITH MINERALS) TABS Take 1 tablet by mouth daily.      Marland Kitchen omega-3 acid ethyl esters (LOVAZA) 1 G capsule Take 1 g by mouth 2 (two) times daily.      . potassium chloride (K-DUR) 10 MEQ tablet Take 20-30 mEq by mouth 2 (two) times daily. Take 2 tablets in Katrina morning and 3 tablets at bedtime.      . Tamsulosin HCl (FLOMAX) 0.4 MG CAPS Take 0.4 mg by mouth daily after breakfast.      . traMADol (ULTRAM) 50 MG tablet Take 2 tablets by mouth 2 (two) times daily.      Marland Kitchen triamcinolone cream (KENALOG) 0.1 % Apply 1 application topically as needed.      . trimethoprim (TRIMPEX) 100 MG tablet Take 100 mg by mouth at bedtime.      Marland Kitchen VITAMIN D, CHOLECALCIFEROL, PO Take 10,000 Units by mouth daily.      . vitamin E 400 UNIT capsule Take 400 Units by mouth daily.      . [DISCONTINUED] ferrous sulfate 325 (65 FE) MG tablet Take 325 mg by mouth daily with breakfast.      . [DISCONTINUED] rOPINIRole (REQUIP XL) 4 MG 24 hr tablet Take 4 mg by mouth at bedtime.       No facility-administered encounter medications on file as of 08/15/2012.   Review of Systems  Constitutional: Positive for fatigue.  Endocrine: Positive for heat intolerance (feeling hot).  Genitourinary: Positive for difficulty urinating (urination problems).  Neurological: Positive for dizziness and  tremors.  Psychiatric/Behavioral: Sleep disturbance: restless legs, insomnia, not enough sleep.    Objective:  Neurologic Exam  Physical Exam Physical Examination:   Filed Vitals:   08/15/12 0936  BP: 171/76  Pulse: 80  Temp: 98.2 F (36.8 C)    General Examination: Katrina Rivera is a very pleasant 67 y.o. female in no acute distress. Katrina Rivera appears well-developed and well-nourished and well groomed.  HEENT: Normocephalic, atraumatic, pupils are equal, round and reactive to light and accommodation. Funduscopic exam is normal with sharp disc margins noted. Extraocular tracking is good without limitation to gaze excursion or nystagmus noted. Normal smooth pursuit is noted. Hearing is grossly intact. Tympanic membranes are clear bilaterally. Face is symmetric with normal facial animation and normal facial sensation. Speech is clear with no dysarthria noted. There is no hypophonia. There is no lip, neck/head, jaw or voice tremor. Neck is supple with full range of passive and active motion. There are no carotid bruits on auscultation. Oropharynx exam reveals: mild mouth dryness, good dental hygiene and mild airway crowding, due to elongated tongue. Mallampati is class II. Tongue protrudes centrally and palate elevates symmetrically.   Chest: Clear to auscultation without wheezing, rhonchi or crackles noted.  Heart: S1+S2+0, regular and normal without murmurs, rubs or gallops noted.   Abdomen: Soft, non-tender and non-distended with normal bowel sounds appreciated on auscultation.  Extremities: There is no pitting edema in Katrina distal lower extremities bilaterally. Pedal pulses are intact.  Skin: Warm and dry without trophic changes noted. There are no varicose veins.  Musculoskeletal: exam reveals no obvious joint deformities, other than R foot post-surgical changes. No tenderness or joint swelling or erythema.   Neurologically:  Mental status: Katrina Rivera is awake, alert and oriented in all  4 spheres. Katrina Rivera memory, attention, language and knowledge are appropriate. There is no aphasia, agnosia, apraxia or anomia. Speech is clear with normal prosody and enunciation. Thought process is linear. Mood is congruent and affect is normal.  Cranial nerves are as described above under HEENT exam. In addition, shoulder shrug is normal with equal shoulder height noted. Motor exam: Normal bulk, strength and tone is noted. There is no drift, tremor or rebound. Romberg is negative. Reflexes are 2+ throughout. Toes are downgoing bilaterally. Fine motor skills are intact with normal finger taps, normal hand movements, normal rapid alternating patting, normal foot taps and normal foot agility.  Cerebellar testing shows no dysmetria or intention tremor on finger to nose testing. There is no truncal or gait ataxia. No abnormal involuntary movements today. Sensory exam is intact to light touch, pinprick, vibration, temperature sense in Katrina upper and lower extremities.  Gait, station and balance: Katrina Rivera stands up somewhat cautiously. No veering to one side is noted. No leaning to one side is noted. Posture is age-appropriate and stance is mildly wide based. No problems turning are noted. Katrina Rivera walks cautiously and has mild difficulty with tandem walk and with heel.               Assessment and Plan:   Assessment and Plan:  In summary, NAKIMA FLUEGGE is a very pleasant 66 y.o.-year old female with a history of MS, stable, chronic pain, fairly well controlled including a Dx of FMS. Katrina Rivera has bladder problems and GI problems and is dealing with quite well. Katrina Rivera physical exam is stable. Katrina Rivera is doing fairly well at this time and Katrina Rivera reassured Katrina Rivera in that regard. For residual RLS Sx, Katrina Rivera suggested increasing Katrina gabapentin to 900 mg at night. Katrina Rivera will be on a total dose of 1500mg  daily. Katrina Rivera is advised to try this and if this does not pan out, we could try low dose Neupro.  Katrina Rivera had a long chat with Katrina Rivera  about my findings and Katrina Rivera Symptoms. Katrina Rivera suggested FU in 6 months with one of my associates for her MS management and routine FU. Katrina Rivera  was in Rivera. Katrina Rivera provided Katrina Rivera with refills for xanax, neurontin and tramadol.   Katrina Rivera answered all their questions today and Katrina Rivera with Katrina above outlined plan. They are encouraged to call with any interim questions, concerns, problems or updates and refill requests and if Katrina Rivera wishes to try Neupro, we can go ahead and initiate that over Katrina phone. Katrina Rivera is encouraged to try Katrina increased dose of gabapentin for twice a month. Katrina Rivera did point out to Katrina Rivera that because of Katrina Rivera colectomy and GI issues Katrina Rivera may still face side effects with Neupro but it may be worthwhile trying. Katrina Rivera understood and was in Rivera.

## 2012-08-15 NOTE — Patient Instructions (Addendum)
I think overall you are doing fairly well but I do want to suggest a few things today:  Remember to drink plenty of fluid, eat healthy meals and do not skip any meals. Try to eat protein with a every meal and eat a healthy snack such as fruit or nuts in between meals. Try to keep a regular sleep-wake schedule and try to exercise daily, particularly in the form of walking, 20-30 minutes a day, if you can.   As far as your medications are concerned, I would like to suggest  no new medications  As far as diagnostic testing: No new test today  I suggest a follow up with one of my associates in about 6 months. Dola Factor, our nurse, will call you for your appointment.  Please also call us for any test results so we can go over those with you on the phone. Brett Canales is my clinical assistant and will answer any of your questions and relay your messages to me and also relay most of my messages to you.  Our phone number is [phone]. We also have an after hours call service for urgent matters and there is a physician on-call for urgent questions. For any emergencies you know to call 911 or go to the nearest emergency room.

## 2012-08-16 ENCOUNTER — Ambulatory Visit (INDEPENDENT_AMBULATORY_CARE_PROVIDER_SITE_OTHER): Payer: Medicare Other | Admitting: Endocrinology

## 2012-08-16 ENCOUNTER — Encounter: Payer: Self-pay | Admitting: Endocrinology

## 2012-08-16 ENCOUNTER — Telehealth: Payer: Self-pay | Admitting: *Deleted

## 2012-08-16 VITALS — BP 118/72 | HR 65 | Ht 63.0 in | Wt 123.7 lb

## 2012-08-16 DIAGNOSIS — R5381 Other malaise: Secondary | ICD-10-CM

## 2012-08-16 DIAGNOSIS — R5383 Other fatigue: Secondary | ICD-10-CM

## 2012-08-16 DIAGNOSIS — E2749 Other adrenocortical insufficiency: Secondary | ICD-10-CM

## 2012-08-16 DIAGNOSIS — E2740 Unspecified adrenocortical insufficiency: Secondary | ICD-10-CM | POA: Insufficient documentation

## 2012-08-16 DIAGNOSIS — G609 Hereditary and idiopathic neuropathy, unspecified: Secondary | ICD-10-CM

## 2012-08-16 DIAGNOSIS — I951 Orthostatic hypotension: Secondary | ICD-10-CM

## 2012-08-16 DIAGNOSIS — G238 Other specified degenerative diseases of basal ganglia: Secondary | ICD-10-CM

## 2012-08-16 DIAGNOSIS — G903 Multi-system degeneration of the autonomic nervous system: Secondary | ICD-10-CM | POA: Insufficient documentation

## 2012-08-16 LAB — CBC
HCT: 29.9 % — ABNORMAL LOW (ref 36.0–46.0)
Hemoglobin: 9.7 g/dL — ABNORMAL LOW (ref 12.0–15.0)
Platelets: 286 10*3/uL (ref 150.0–400.0)
RBC: 3.64 Mil/uL — ABNORMAL LOW (ref 3.87–5.11)
WBC: 9.8 10*3/uL (ref 4.5–10.5)

## 2012-08-16 NOTE — Telephone Encounter (Signed)
Katrina Rivera called, she was concerned about her weight gain, she said she noticed on the bottle of hydrocortisone that it can cause weight gain, she wants to know if there is another med she can use that won't cause this?  Please advise

## 2012-08-16 NOTE — Progress Notes (Signed)
Subjective:     Patient ID: Katrina Rivera, female   DOB: Jan 04, 1946, 67 y.o.   MRN: [id number]  HPI  PAST history: She has had long-standing problems with orthostatic hypotension and also previous hyponatremia She has been on Florinef since 2004 and the dose has been stable at 3 tablets daily along with 5 tablets of potassium which has previously controlled her symptoms well . She apparently does better with the regimen of one tablet in the morning and 2 in the evening. Blood pressure tends to get high if she increases Florinef by one tablet.  RECENT history: She has had persistent symptoms of dizziness periodically while standing up, sometimes after walking for a little while. She does not always check her blood pressure when symptomatic but she thinks symptoms are from low blood pressure. She will feel a little better with sitting down. However has not documented significantly low blood pressures at home  Because of persistent orthostatic symptoms she has been started on midodrine which she has been taking 2.5 mg twice a day since 05/28/12. ORTHOSTATIC  Symptoms are significantly better and not as often:  She needs to sit down periodically because of feeling lightheaded for up to 15 minutes. 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  Prior testing includes Cortrosyn test baseline with stimulated level of 15.7, baseline 3.9.  The response to therapy was Improved symptoms with better appetite, no further weight loss and less frequent nausea. However she still feels tired. At first did not tolerate hydrocortisone and was given prednisone. Was having abdominal distress with taking prednisone at breakfast and also had insomnia, stomach pain, nausea and requested hydrocortisone. Taking replacement doses now at lunch and supper and is tolerating this  well. However now complaining about weight gain  General Endocrinology:  Fatigue chronic, still pushes herself to do her daily routine and this has been going on for some time, has not discussed with any primary care physician. She has difficulty sleeping at times. Because of low normal free T4 she was tried on levothyroxine 25 mcg but with this she had diarrhea, sweating and irritability, stopped this after 2 days. However free T4 is normal the  Increased sweating:  she gets hot flashes and periodic sweating in day. Had been slightly better with Climara 0.1 mg but now she does not think it helped there and stopped this. Effexor was also tried but she did not tolerate this and also sweating was no better    Medication List       This list is accurate as of: 08/16/12  9:26 PM.  Always use your most recent med list.               acetaminophen 650 MG CR tablet  Commonly known as:  TYLENOL  Take 650 mg by mouth 2 (two) times daily.     ALPRAZolam 0.5 MG tablet  Commonly known as:  XANAX  Take 1 tablet (0.5 mg total) by mouth at bedtime.     b complex vitamins tablet  Take 1 tablet by mouth daily.     bethanechol 25 MG tablet  Commonly known as:  URECHOLINE  Take 25 mg by mouth 2 (two) times daily.     bifidobacterium infantis capsule  Take 1 capsule by mouth daily.     calcium-vitamin D 500-200 MG-UNIT per tablet  Commonly  known as:  OSCAL WITH D  Take 1 tablet by mouth 2 (two) times daily.     cephALEXin 500 MG capsule  Commonly known as:  KEFLEX  Take 1 capsule by mouth as needed.     fludrocortisone 0.1 MG tablet  Commonly known as:  FLORINEF  Take 0.1-0.2 mg by mouth 2 (two) times daily. Take 1 tablet in the morning and 2 tablet at bedtime.     gabapentin 300 MG capsule  Commonly known as:  NEURONTIN  Take 2 capsules in AM and 3 at bedtime.     hydrocortisone 10 MG tablet  Commonly known as:  CORTEF  Take 1 tablet by mouth 2 (two) times daily.     lansoprazole  30 MG capsule  Commonly known as:  PREVACID  Take 30 mg by mouth daily.     midodrine 2.5 MG tablet  Commonly known as:  PROAMATINE  Take 1 tablet by mouth 2 (two) times daily.     multivitamin with minerals Tabs  Take 1 tablet by mouth daily.     omega-3 acid ethyl esters 1 G capsule  Commonly known as:  LOVAZA  Take 1 g by mouth 2 (two) times daily.     potassium chloride 10 MEQ tablet  Commonly known as:  K-DUR  Take 20-30 mEq by mouth 2 (two) times daily. Take 2 tablets in the morning and 3 tablets at bedtime.     tamsulosin 0.4 MG Caps  Commonly known as:  FLOMAX  Take 0.4 mg by mouth daily after breakfast.     traMADol 50 MG tablet  Commonly known as:  ULTRAM  Take 2 tablets (100 mg total) by mouth 2 (two) times daily.     triamcinolone cream 0.1 %  Commonly known as:  KENALOG  Apply 1 application topically as needed.     trimethoprim 100 MG tablet  Commonly known as:  TRIMPEX  Take 100 mg by mouth at bedtime.     VITAMIN D (CHOLECALCIFEROL) PO  Take 10,000 Units by mouth daily.     vitamin E 400 UNIT capsule  Take 400 Units by mouth daily.          Chemistry      Component Value Date/Time   NA 139 08/15/2012 1313   K 4.8 08/15/2012 1313   CL 102 08/15/2012 1313   CO2 32 08/15/2012 1313   BUN 14 08/15/2012 1313   CREATININE 0.9 08/15/2012 1313      Component Value Date/Time   CALCIUM 9.8 08/15/2012 1313   ALKPHOS 77 07/21/2006 1405   AST 18 07/21/2006 1405   ALT 13 07/21/2006 1405   BILITOT 0.8 07/21/2006 1405     Review of Systems  Cardiovascular: Negative for leg swelling.  Genitourinary: Positive for difficulty urinating.       Taking medications for neurogenic bladder  Neurological: Positive for numbness.       Has peripheral neuropathy, symptomatic of unclear etiology and followed by neurologist       Objective:   Physical Exam  BP 118/72  Pulse 65  Ht 5\' 3"  (1.6 m)  Wt 123 lb 11.2 oz (56.11 kg)  BMI 21.92 kg/m2  SpO2 98%     Standing blood  pressure 122/72  No pedal edema  Assessment:     Orthostatic hypotension syndrome: She is now finally better symptomatically after continuing to try to adjust her medications including Florinef, hydrocortisone; she has definitely benefit from adding midodrine low doses. Has  only very mild transient orthostatic symptoms now. However she is not monitoring her blood pressure at home as directed and will need to have her keep an eye on this. Did have high readings with the neurologist likely to be from anxiety.  She continues to have significant fatigue of unclear etiology. No evidence of secondary hypothyroidism. She is asking to be checked for anemia and will check her CBC and B12 level. Hemoglobin was normal in 2003, March  Hyperhidrosis: She has not benefited from of bone replacement much and did not want to continue. Also did not tolerate Effexor, will continue to go without pharmacological  Agents  HYPOKALEMIA: improved, likely to be from Florinef and since potassium is 4.8 we'll reduce the dose     Plan:         Continue same regimen of Florinef, hydrocortisone and midodrine. To check blood pressure at least once a week including standing  Since potassium is 4.8 can reduce her supplement to 4 tablets instead of 5

## 2012-08-16 NOTE — Patient Instructions (Signed)
Reduce potassium to 4 a day.  Check blood pressure sitting and standing at least once a week  Continue same doses of midodrine, Florinef and hydrocortisone

## 2012-08-16 NOTE — Telephone Encounter (Signed)
It does not cause weight gain in the dose she is taking, at the most she can try to reduce her lunchtime dose to 1-1/2 tablets instead of 2 of the 10 mg but she will have to go back up if she starts getting more lightheadedness or fatigue

## 2012-08-17 NOTE — Progress Notes (Signed)
Patient ID: Katrina Rivera, female   DOB: 1945-12-07, 67 y.o.   MRN: [id number] Hemoglobin is 9.7 and she will need to see PCP

## 2012-08-17 NOTE — Telephone Encounter (Signed)
Noted pt is aware 

## 2012-08-23 ENCOUNTER — Telehealth: Payer: Self-pay | Admitting: *Deleted

## 2012-08-23 NOTE — Telephone Encounter (Signed)
Her urination has nothing to do with my medications but it is related to her bladder problem Would suggest that she reduce only the morning midodrine to half tablet and not the evening If she starts feeling weak she will have to increase her hydrocortisone to 2 tablets at lunch again

## 2012-08-23 NOTE — Telephone Encounter (Signed)
Noted, pt is aware of instructions

## 2012-08-23 NOTE — Telephone Encounter (Signed)
Katrina Rivera called and said she is cutting down her bp med Midoodrine to a half tablet because her bp is high, up in the 140's to the 180's and she's also cutting her Hydrocortisone down to 2 pills a day, she said she's gained 6 lbs since she's been on it and she also says she is urinating frequently, she's up all the time at night to urinate and can't get any rest.

## 2012-08-28 ENCOUNTER — Telehealth: Payer: Self-pay | Admitting: Neurology

## 2012-08-28 DIAGNOSIS — G2581 Restless legs syndrome: Secondary | ICD-10-CM

## 2012-08-28 MED ORDER — ROTIGOTINE 2 MG/24HR TD PT24: 1.0000 | MEDICATED_PATCH | Freq: Every day | TRANSDERMAL | Status: DC

## 2012-08-28 NOTE — Telephone Encounter (Signed)
Called patient. Informed of Dr. Teofilo Pod previous note. Pt agreed.

## 2012-08-28 NOTE — Telephone Encounter (Signed)
She can reduce the gabapentin to the 2 pills at night. I will send rx for Neupro to her pharmacy.

## 2012-08-28 NOTE — Telephone Encounter (Signed)
Spoke to patient.She is requesting to start Neupro patch recommended. Would like to know if she should go back to 2 Gabapentin caps @ hs instead of 3 also. Says does not seem to be working.

## 2012-08-29 ENCOUNTER — Telehealth: Payer: Self-pay | Admitting: Neurology

## 2012-08-29 DIAGNOSIS — G2581 Restless legs syndrome: Secondary | ICD-10-CM

## 2012-08-29 MED ORDER — ROPINIROLE HCL ER 4 MG PO TB24: 4.0000 mg | ORAL_TABLET | Freq: Every day | ORAL | Status: DC

## 2012-08-29 NOTE — Telephone Encounter (Signed)
We can do the Requip, but remind pt that she stopped it d/t nausea. Rx sent to pharm.

## 2012-08-29 NOTE — Telephone Encounter (Signed)
Patient cannot afford Neupro. Would like to get back on generic Requip.

## 2012-08-29 NOTE — Telephone Encounter (Signed)
Left vmail for patient as requested b/c she had an appt.

## 2012-08-31 ENCOUNTER — Telehealth: Payer: Self-pay | Admitting: Neurology

## 2012-08-31 NOTE — Telephone Encounter (Signed)
Pt called states she started back on the medication rOPINIRole (REQUIP XL) 4 MG 24 hr tablet, states medicine makes her sick again and worse is there another medication that she can take. Thanks

## 2012-08-31 NOTE — Telephone Encounter (Signed)
Patient states Ropinirole makes her nauseated and would like to know if she can have another medication to help her. Contact number [phone]

## 2012-09-03 ENCOUNTER — Telehealth: Payer: Self-pay | Admitting: Neurology

## 2012-09-04 NOTE — Telephone Encounter (Signed)
I cannot really say one way or another if neupro is as strong as requip, it depends on the dose and the patient's response to it.

## 2012-09-05 ENCOUNTER — Telehealth: Payer: Self-pay | Admitting: Neurology

## 2012-09-05 NOTE — Telephone Encounter (Signed)
I called pt and she is asking for something that costs less then the neupro patch whih now is $200.00 copayment.   She used fentanyl patch back in 2002 which is generic.  I mentioned pain management referral, this may be several months away if referred.  Please advise again.

## 2012-09-06 NOTE — Telephone Encounter (Signed)
I would not recommend fentanyl for RLS. She may want to discuss pain management referral with PCP. thx

## 2012-09-07 NOTE — Telephone Encounter (Signed)
I called pt back and relayed that no other options.   If fentanyl requested she would need to see pcp for referral to pain management.  She may or may not get the neupro patch due to cost.

## 2012-09-10 ENCOUNTER — Telehealth: Payer: Self-pay | Admitting: *Deleted

## 2012-09-10 NOTE — Telephone Encounter (Signed)
There is no safe medication for her, she needs to go back to the hormone patches If she wants she can try propantheline 7.5 mg at bedtime but this may slow down her bladder

## 2012-09-10 NOTE — Telephone Encounter (Signed)
Pt is asking for something to help with her sweating, she says it's "Pouring down her head and body", she said she thought you had said something about another type of medication.  She said she has symptoms of DM. Please advise

## 2012-09-11 ENCOUNTER — Other Ambulatory Visit: Payer: Self-pay | Admitting: *Deleted

## 2012-09-11 MED ORDER — PROPANTHELINE BROMIDE 15 MG PO TABS: ORAL_TABLET | ORAL | Status: DC

## 2012-09-11 NOTE — Telephone Encounter (Signed)
rx sent, pt aware 

## 2012-09-26 ENCOUNTER — Other Ambulatory Visit: Payer: Self-pay | Admitting: *Deleted

## 2012-09-26 MED ORDER — ESTRADIOL 0.05 MG/24HR TD PTTW: MEDICATED_PATCH | TRANSDERMAL | Status: DC

## 2012-10-01 ENCOUNTER — Telehealth: Payer: Self-pay | Admitting: *Deleted

## 2012-10-01 ENCOUNTER — Other Ambulatory Visit: Payer: Self-pay | Admitting: *Deleted

## 2012-10-01 MED ORDER — FLUDROCORTISONE ACETATE 0.1 MG PO TABS: 0.1000 mg | ORAL_TABLET | Freq: Two times a day (BID) | ORAL | Status: DC

## 2012-10-01 NOTE — Telephone Encounter (Signed)
Pt says her bp has been going high and then bottoming out,  154/75, next was 117, yesterday was 150, then dropped to 139, latest was 116, then dropped to 82. These are all top numbers. Please advise

## 2012-10-02 NOTE — Telephone Encounter (Signed)
Noted, pt is aware 

## 2012-10-02 NOTE — Telephone Encounter (Signed)
Is she having lightheadedness at any time? She can take an extra midodrine 2.5 mg daily if the blood pressure is below 110. Otherwise not be concerned about blood pressure of 150

## 2012-10-03 ENCOUNTER — Other Ambulatory Visit: Payer: Self-pay | Admitting: *Deleted

## 2012-10-03 MED ORDER — FLUDROCORTISONE ACETATE 0.1 MG PO TABS: ORAL_TABLET | ORAL | Status: DC

## 2012-10-04 ENCOUNTER — Other Ambulatory Visit: Payer: Self-pay | Admitting: *Deleted

## 2012-10-04 ENCOUNTER — Telehealth: Payer: Self-pay | Admitting: Endocrinology

## 2012-10-04 MED ORDER — MIDODRINE HCL 2.5 MG PO TABS: 2.5000 mg | ORAL_TABLET | Freq: Two times a day (BID) | ORAL | Status: DC

## 2012-10-04 NOTE — Telephone Encounter (Signed)
rx sent

## 2012-10-10 ENCOUNTER — Telehealth: Payer: Self-pay | Admitting: Endocrinology

## 2012-10-10 NOTE — Telephone Encounter (Signed)
Bp 114/72 standing 65-82 with lightheadedness, blood pressure has been lower on her last 2 weeks or so, no high readings on sitting Patient advised to take extra Florinef, total 4 tablets daily until next visit and continue hydrocortisone and midodrine unchanged

## 2012-10-10 NOTE — Telephone Encounter (Signed)
Pt says her bp is dropping 30-40 points every day, she says her pulse goes to 114, she is taking 2 of the midodrine, she's c/o of dizziness. Please advise.

## 2012-10-10 NOTE — Telephone Encounter (Signed)
Call about BP readings / Sherri S.

## 2012-10-17 ENCOUNTER — Other Ambulatory Visit (INDEPENDENT_AMBULATORY_CARE_PROVIDER_SITE_OTHER): Payer: Medicare Other

## 2012-10-17 ENCOUNTER — Other Ambulatory Visit: Payer: Medicare Other

## 2012-10-17 ENCOUNTER — Other Ambulatory Visit: Payer: Self-pay | Admitting: Endocrinology

## 2012-10-17 DIAGNOSIS — E2749 Other adrenocortical insufficiency: Secondary | ICD-10-CM

## 2012-10-17 DIAGNOSIS — E2740 Unspecified adrenocortical insufficiency: Secondary | ICD-10-CM

## 2012-10-17 DIAGNOSIS — R5381 Other malaise: Secondary | ICD-10-CM

## 2012-10-17 DIAGNOSIS — D649 Anemia, unspecified: Secondary | ICD-10-CM

## 2012-10-17 LAB — COMPREHENSIVE METABOLIC PANEL
BUN: 14 mg/dL (ref 6–23)
CO2: 30 mEq/L (ref 19–32)
GFR: 76.5 mL/min (ref >60.00)
Glucose, Bld: 67 mg/dL — ABNORMAL LOW (ref 70–99)
Sodium: 139 mEq/L (ref 135–145)
Total Bilirubin: 0.4 mg/dL (ref 0.3–1.2)
Total Protein: 6.4 g/dL (ref 6.0–8.3)

## 2012-10-17 LAB — CBC WITH DIFFERENTIAL/PLATELET
Basophils Absolute: 0 10*3/uL (ref 0.0–0.1)
Basophils Relative: 0 % (ref 0.0–3.0)
Eosinophils Absolute: 0.1 10*3/uL (ref 0.0–0.7)
Lymphocytes Relative: 15.5 % (ref 12.0–46.0)
MCHC: 33 g/dL (ref 30.0–36.0)
Neutrophils Relative %: 76.2 % (ref 43.0–77.0)
RBC: 3.77 Mil/uL — ABNORMAL LOW (ref 3.87–5.11)
RDW: 18.1 % — ABNORMAL HIGH (ref 11.5–14.6)

## 2012-10-17 LAB — T4, FREE: Free T4: 0.72 ng/dL (ref 0.60–1.60)

## 2012-10-19 ENCOUNTER — Other Ambulatory Visit: Payer: Self-pay | Admitting: *Deleted

## 2012-10-19 ENCOUNTER — Encounter: Payer: Self-pay | Admitting: Endocrinology

## 2012-10-19 ENCOUNTER — Other Ambulatory Visit: Payer: Medicare Other

## 2012-10-19 ENCOUNTER — Ambulatory Visit (INDEPENDENT_AMBULATORY_CARE_PROVIDER_SITE_OTHER): Payer: Medicare Other | Admitting: Endocrinology

## 2012-10-19 VITALS — BP 140/75 | HR 87 | Temp 98.6°F | Resp 10 | Ht 60.0 in | Wt 118.0 lb

## 2012-10-19 DIAGNOSIS — L749 Eccrine sweat disorder, unspecified: Secondary | ICD-10-CM

## 2012-10-19 DIAGNOSIS — E2740 Unspecified adrenocortical insufficiency: Secondary | ICD-10-CM

## 2012-10-19 DIAGNOSIS — E2749 Other adrenocortical insufficiency: Secondary | ICD-10-CM

## 2012-10-19 DIAGNOSIS — G903 Multi-system degeneration of the autonomic nervous system: Secondary | ICD-10-CM

## 2012-10-19 DIAGNOSIS — L748 Other eccrine sweat disorders: Secondary | ICD-10-CM

## 2012-10-19 DIAGNOSIS — G609 Hereditary and idiopathic neuropathy, unspecified: Secondary | ICD-10-CM

## 2012-10-19 DIAGNOSIS — G238 Other specified degenerative diseases of basal ganglia: Secondary | ICD-10-CM

## 2012-10-19 MED ORDER — CLONIDINE HCL 0.1 MG/24HR TD PTWK: 1.0000 | MEDICATED_PATCH | TRANSDERMAL | Status: DC

## 2012-10-19 NOTE — Patient Instructions (Addendum)
  Vivelle 2 patches at a time, call if higher dose  Clonidine patch weekly, stop if BP low  Capsaicin 3x daily

## 2012-10-19 NOTE — Progress Notes (Signed)
Patient ID: Katrina Rivera, female   DOB: July 27, 1945, 67 y.o.   MRN: [id number]  Subjective:     Patient ID: Katrina Rivera, female   DOB: [birth year], 67 y.o.   MRN: [id number]  Chief complaint: Lightheadedness  PAST history: She has had long-standing problems with orthostatic hypotension and also previous hyponatremia She has been on Florinef since 2004 and the dose has been stable at 3 tablets daily along with 5 tablets of potassium which has previously controlled her symptoms well . She apparently does better with the regimen of one tablet in the morning and 2 in the evening. Blood pressure tends to get high if she increases Florinef by one tablet.  Because of persistent orthostatic symptoms she had been started on midodrine which she has been taking 2.5 mg twice a day since 05/28/12.   RECENT history:   She is still having periodic problems with hypotension despite continuing to use midodrine 2.5 mg twice a day with her regimen of 3 tablets of Florinef. 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  Prior testing includes Cortrosyn test baseline with stimulated level of 15.7, baseline 3.9.  The response to therapy was Improved symptoms with better appetite, no further weight loss and less frequent nausea.At first did not tolerate hydrocortisone and was given  prednisone. Was having abdominal distress with taking prednisone at breakfast and also had insomnia, stomach pain, nausea and requested hydrocortisone.  Taking replacement doses of hydrocortisone now at lunch and supper and is tolerating this well.   General Endocrinology:   she did have a low free T4 at one point but did not tolerate thyroxine supplementation and free T4 is not consistently normal without supplements  Increased sweating:  she gets hot flashes and periodic sweating in day. She is not complaining of worsening episodes of marked sweating. Did not tolerate propantheline. Had been slightly better with Climara 0.1 mg but subsequently stopped this. Not taking Vivelle but only 0.05 mg twice a week . Effexor was also tried but she did not tolerate this and also sweating was no better  Review of Systems   Cardiovascular: Negative for leg swelling.  Genitourinary: Positive for difficulty urinating.       She was at urecholine neurogenic bladder  but stopped this because of excessive salivation Neurological: Positive for numbness.       Has peripheral neuropathy, symptomatic of unclear etiology and followed by neurologist  She is again complaining of significant amount of pain in her feet and legs and has not had relief with large doses of gabapentin. Has also tried Cymbalta and Lyrica. She did try capsaicin but this caused burning and she only tried one or 2 applications  She was recently found to be anemic and was started on iron by PCP. Hemoglobin is slightly better  Labs:  Appointment on 10/17/2012  Component  Date Value Range Status  . Sodium 10/17/2012 139  135 - 145 mEq/L Final  . Potassium 10/17/2012 3.7  3.5 - 5.1 mEq/L Final  . Chloride 10/17/2012 103  96 - 112 mEq/L Final  . CO2 10/17/2012 30  19 - 32 mEq/L Final  . Glucose, Bld 10/17/2012 67* 70 - 99 mg/dL Final  . BUN 16/11/9602 14  6 - 23 mg/dL Final  . Creatinine, Ser 10/17/2012 0.8  0.4 - 1.2 mg/dL Final  . Total  Bilirubin 10/17/2012 0.4  0.3 - 1.2 mg/dL Final  . Alkaline Phosphatase 10/17/2012 46  39 - 117 U/L Final  . AST 10/17/2012 24  0 - 37 U/L Final  . ALT 10/17/2012 19  0 - 35 U/L Final  . Total Protein 10/17/2012 6.4  6.0 - 8.3 g/dL Final  . Albumin 54/10/8117 3.3* 3.5 - 5.2 g/dL Final  . Calcium 14/78/2956 9.0  8.4 - 10.5 mg/dL Final  . GFR 21/30/8657 76.50  >60.00 mL/min Final  . Free T4 10/17/2012 0.72  0.60 - 1.60 ng/dL Final  . TSH 84/69/6295 2.14  0.35 - 5.50 uIU/mL Final  . WBC 10/17/2012 12.1* 4.5 - 10.5 K/uL Final  . RBC 10/17/2012 3.77* 3.87 - 5.11 Mil/uL Final  . Hemoglobin 10/17/2012 10.5* 12.0 - 15.0 g/dL Final  . HCT 28/41/3244 31.8* 36.0 - 46.0 % Final  . MCV 10/17/2012 84.4  78.0 - 100.0 fl Final  . MCHC 10/17/2012 33.0  30.0 - 36.0 g/dL Final  . RDW 02/09/7251 18.1* 11.5 - 14.6 % Final  . Platelets 10/17/2012 332.0  150.0 - 400.0 K/uL Final  . Neutrophils Relative % 10/17/2012 76.2  43.0 - 77.0 % Final  . Lymphocytes Relative 10/17/2012 15.5  12.0 - 46.0 % Final  . Monocytes Relative 10/17/2012 7.8  3.0 - 12.0 % Final  . Eosinophils Relative 10/17/2012 0.5  0.0 - 5.0 % Final  . Basophils Relative 10/17/2012 0.0  0.0 - 3.0 % Final  . Neutro Abs 10/17/2012 9.2* 1.4 - 7.7 K/uL Final  . Lymphs Abs 10/17/2012 1.9  0.7 - 4.0 K/uL Final  . Monocytes Absolute 10/17/2012 0.9  0.1 - 1.0 K/uL Final  . Eosinophils Absolute 10/17/2012 0.1  0.0 - 0.7 K/uL Final  . Basophils Absolute 10/17/2012 0.0  0.0 - 0.1 K/uL Final       Objective:   Physical Exam  BP 118/72  Pulse 65  Ht 5\' 3"  (1.6 m)  Wt 123 lb 11.2 oz (56.11 kg)  BMI 21.92 kg/m2  SpO2 98%     Standing blood pressure 122/72  No pedal edema  Assessment:      Orthostatic dysautonomic hypotension syndrome:   Her symptoms were worse recently but with increasing the dose of Florinef to 2 tablets twice a day her blood pressure is improving and she is not symptomatic now. Blood pressure is excellent today and she  continues on low-dose midodrine She has also tried to monitor her blood pressure more closely including standing up No side effects from current regimen although not clear if her higher pulse rate is from midodrine  Hyperhidrosis: This appears to be her main complaint today. She has not benefited from of hormone replacement much but is still trying the Vivelle patch, however currently only has a 0.05 mg dose. Also did not tolerate Effexor as well as propantheline which caused bladder difficulties  HYPOKALEMIA: Controlled, likely to be from Florinef and is on supplements  Peripheral neuropathy: She is asking about topical treatment.  She is intolerant to generic and did not benefit from Cymbalta. Already on maximum dose of Neurontin. She did try capsaicin but only tried it once or twice  Anemia: Appears to be getting better with iron supplement given by PCP  Plan:         Continue same regimen of Florinef, 4 tablets today along with hydrocortisone and midodrine. To check blood pressure at least once a week including standing  Trial of clonidine patches for hyperhidrosis, discussed that this can potentially cause hypotension but since she has already increased her Florinef and her pulse is relatively fast may be able to tolerate this. She will remove this right away if her blood pressure starts going low  Trial of capsaicin again and apply 2-4 times a day consistently for at least a week. Followup with neurologist for neuropathic symptoms

## 2012-10-22 ENCOUNTER — Ambulatory Visit: Payer: Medicare Other | Admitting: Endocrinology

## 2012-11-13 ENCOUNTER — Ambulatory Visit (INDEPENDENT_AMBULATORY_CARE_PROVIDER_SITE_OTHER): Payer: Medicare Other | Admitting: Neurology

## 2012-11-13 ENCOUNTER — Encounter: Payer: Self-pay | Admitting: Neurology

## 2012-11-13 VITALS — BP 185/83 | HR 84 | Ht 60.0 in | Wt 123.0 lb

## 2012-11-13 DIAGNOSIS — G35 Multiple sclerosis: Secondary | ICD-10-CM

## 2012-11-13 DIAGNOSIS — F411 Generalized anxiety disorder: Secondary | ICD-10-CM

## 2012-11-13 DIAGNOSIS — G894 Chronic pain syndrome: Secondary | ICD-10-CM

## 2012-11-13 MED ORDER — OXCARBAZEPINE 300 MG PO TABS: 300.0000 mg | ORAL_TABLET | Freq: Two times a day (BID) | ORAL | Status: DC

## 2012-11-13 MED ORDER — GABAPENTIN 800 MG PO TABS: 800.0000 mg | ORAL_TABLET | Freq: Three times a day (TID) | ORAL | Status: DC

## 2012-11-13 MED ORDER — OXCARBAZEPINE 150 MG PO TABS: 150.0000 mg | ORAL_TABLET | Freq: Two times a day (BID) | ORAL | Status: DC

## 2012-11-13 MED ORDER — ALPRAZOLAM 0.5 MG PO TABS: 0.5000 mg | ORAL_TABLET | Freq: Every day | ORAL | Status: DC

## 2012-11-13 NOTE — Progress Notes (Signed)
Subjective:    Patient ID: Katrina Rivera is a 67 y.o. female.   Katrina Rivera is a very pleasant 67 year old right-handed woman, accompanied by her husband, for followup of multiple sclerosis, neuropathic pain of bilateral lower extremities.  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   She was last seen by Dr. Frances Furbish in July 2014, and which time he felt that she was doing well. 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   She has baseline gait difficulty,   she fell in May 2013,  fracture in both elbows and her left kneecap. She had postural hypotension and is on Florinef including prednisone. She intermittently has been using a cane or walker.  She could not tolerate Requip  because of nausea. She has been on gabapentin 300 mg  3 bid. She gained significant wt when on Lyrica and therefore was switched back to gabapentin. She has OH and is on midodrine 2.5 mg and fludrocortisone 0.1 mg 1 in AM and 2 at night, and she is also on 20 mg of hydrocortisone.   She complains of bilateral lower extremity burning stinging sensation, getting worse when she sits still, difficulty falling into sleep, she has the urge to move because of bilateral extremity discomfort, has tried Requip, could not tolerate it because of GI side effects, Neuprol patch does not work either, she also tried Elavil, Cymbalta in the past, could not tolerate it due to side effect.  Past Medical History Is Significant For: Past Medical History  Diagnosis Date  . Fibromyalgia   . Restless  leg syndrome   . Multiple sclerosis   . Arthritis   . Osteoporosis   . MS (multiple sclerosis) 08/15/2012    Her Past Surgical History Is Significant For: Past Surgical History  Procedure Laterality Date  . Fracture surgery    . Appendectomy    . Colectomy    . Cholecystectomy    . Cesarean section    . Abdominal hysterectomy      Her Family History Is Significant For: Family History  Problem Relation Age of Onset  . Hypertension Mother   . Stroke Mother   . Heart attack Father     Her Social History Is Significant For: History   Social History  . Marital Status: Married    Spouse Name: [name]    Number of Children: 2  . Years of Education: [employer]   Occupational History  .      Disabled   Social History Main Topics  . Smoking status: Never Smoker   . Smokeless tobacco: Never Used  . Alcohol Use: No  . Drug Use: No  . Sexual Activity: Not on file   Other Topics Concern  . Not on file   Social History Narrative   Pt lives at home with spouse. ([name])   Caffeine Use: 2-3 cups daily.   Right handed.   Disabled.    Her Allergies Are:  No Known Allergies:   Her Current Medications Are:  Outpatient Encounter Prescriptions as of 11/13/2012  Medication Sig Dispense Refill  . acetaminophen (TYLENOL) 650 MG CR  tablet Take 650 mg by mouth 2 (two) times daily.      Marland Kitchen ALPRAZolam (XANAX) 0.5 MG tablet Take 1 tablet (0.5 mg total) by mouth at bedtime.  30 tablet  5  . b complex vitamins tablet Take 1 tablet by mouth daily.      . bifidobacterium infantis (ALIGN) capsule Take 1 capsule by mouth daily.      . calcium-vitamin D (OSCAL WITH D) 500-200 MG-UNIT per tablet Take 1 tablet by mouth 2 (two) times daily.      . cloNIDine (CATAPRES-TTS-1) 0.1 mg/24hr patch Place 1 patch (0.1 mg total) onto the skin once a week.  4 patch  3  . estradiol (VIVELLE-DOT) 0.05 MG/24HR patch Use one patch 2 times a week  24 patch  3  . fludrocortisone (FLORINEF) 0.1 MG tablet Take 2  tablet in the morning and 2 tablet at bedtime.      . gabapentin (NEURONTIN) 300 MG capsule Take 3 capsules in AM and 3 at bedtime.      . hydrocortisone (CORTEF) 10 MG tablet Take 1 tablet by mouth 2 (two) times daily.      . lansoprazole (PREVACID) 30 MG capsule Take 30 mg by mouth daily.      . midodrine (PROAMATINE) 2.5 MG tablet Take 1 tablet (2.5 mg total) by mouth 2 (two) times daily.  180 tablet  3  . Multiple Vitamin (MULITIVITAMIN WITH MINERALS) TABS Take 1 tablet by mouth daily.      Marland Kitchen omega-3 acid ethyl esters (LOVAZA) 1 G capsule Take 1 g by mouth 2 (two) times daily.      . potassium chloride (K-DUR) 10 MEQ tablet Take 20-30 mEq by mouth 2 (two) times daily. Take 2 tablets in the morning and 3 tablets at bedtime.      . promethazine (PHENERGAN) 25 MG tablet 25 mg.      . Tamsulosin HCl (FLOMAX) 0.4 MG CAPS Take 0.4 mg by mouth daily after breakfast.      . triamcinolone cream (KENALOG) 0.1 % Apply 1 application topically as needed.      . trimethoprim (TRIMPEX) 100 MG tablet Take 100 mg by mouth at bedtime.      Marland Kitchen VITAMIN D, CHOLECALCIFEROL, PO Take 10,000 Units by mouth daily.      . [DISCONTINUED] bethanechol (URECHOLINE) 25 MG tablet Take 25 mg by mouth 2 (two) times daily.      . [DISCONTINUED] cephALEXin (KEFLEX) 500 MG capsule Take 1 capsule by mouth as needed.      . [DISCONTINUED] rOPINIRole (REQUIP XL) 4 MG 24 hr tablet Take 1 tablet (4 mg total) by mouth at bedtime.  30 tablet  5  . [DISCONTINUED] rotigotine (NEUPRO) 2 MG/24HR Place 1 patch onto the skin daily.  30 patch  3  . [DISCONTINUED] traMADol (ULTRAM) 50 MG tablet Take 2 tablets (100 mg total) by mouth 2 (two) times daily.  360 tablet  3  . [DISCONTINUED] vitamin E 400 UNIT capsule Take 400 Units by mouth daily.       No facility-administered encounter medications on file as of 11/13/2012.   Review of Systems  Constitutional: Positive for fatigue.  Endocrine: Positive for heat intolerance (feeling hot).   Genitourinary: Positive for difficulty urinating (urination problems).  Neurological: Positive for dizziness and tremors.  Psychiatric/Behavioral: Sleep disturbance: restless legs, insomnia, not enough sleep.    Objective:  Neurologic Exam  Physical Exam Physical Examination:   Filed Vitals:   11/13/12 1112  BP: 185/83  Pulse: 84   PHYSICAL EXAMINATOINS:  Generalized: In no acute distress  Neck: Supple, no carotid bruits   Cardiac: Regular rate rhythm  Pulmonary: Clear to auscultation bilaterally  Musculoskeletal: No deformity  Neurological examination  Mentation: Alert oriented to time, place, history taking, and causual conversation  Cranial nerve II-XII: Pupils were equal round reactive to light extraocular movements were full, visual field were full on confrontational test. facial sensation and strength were normal. hearing was intact to finger rubbing bilaterally. Uvula tongue midline.  head turning and shoulder shrug and were normal and symmetric.Tongue protrusion into cheek strength was normal.  Motor: normal tone, bulk and strength.  Sensory: Intact to fine touch, pinprick, preserved vibratory sensation, and proprioception at toes.  Coordination: Normal finger to nose, heel-to-shin bilaterally there was no truncal ataxia  Gait: wide based, mild circumerferential, unsteady gait,   Romberg signs: Negative  Deep tendon reflexes: Brachioradialis 2/2, biceps 2/2, triceps 2/2, patellar 3/3,  Achilles 2/2, plantar responses were flexor bilaterally.  Assessment and Plan:   Assessment and Plan:  In summary, KELLSEY SANSONE is a very pleasant 67 y.o.-year old female with a history of MS, stable, chronic pain, fairly well controlled.  1. Inge's multiple sclerosis symptoms has been fairly stable over the past 10 years, there was no significant flareup, most recent repeat MRI of the brain was in 2004 there was findings consistent with relapsing remitting multiple  sclerosis. 2. she complains bilateral lower extremity neuropathic pain, increase her gabapentin to 800 mg 3 times a day, I will also add on Trileptal 150 twice a day, 3. Continue moderate exercise, 4. Return to clinic in 6 months with Eber Jones

## 2012-11-15 ENCOUNTER — Telehealth: Payer: Self-pay | Admitting: Neurology

## 2012-11-15 ENCOUNTER — Telehealth: Payer: Self-pay | Admitting: *Deleted

## 2012-11-15 NOTE — Telephone Encounter (Signed)
Pt states she needs to come off of one of her BP meds, she said at her PCP her bp was 180 on top, and it's been running high, please advise

## 2012-11-16 ENCOUNTER — Telehealth: Payer: Self-pay | Admitting: *Deleted

## 2012-11-16 ENCOUNTER — Other Ambulatory Visit: Payer: Medicare Other

## 2012-11-16 MED ORDER — OXCARBAZEPINE 150 MG PO TABS: 150.0000 mg | ORAL_TABLET | Freq: Two times a day (BID) | ORAL | Status: DC

## 2012-11-16 MED ORDER — HYDROCORTISONE 10 MG PO TABS: 10.0000 mg | ORAL_TABLET | Freq: Two times a day (BID) | ORAL | Status: DC

## 2012-11-16 MED ORDER — MIDODRINE HCL 2.5 MG PO TABS: 2.5000 mg | ORAL_TABLET | Freq: Two times a day (BID) | ORAL | Status: DC

## 2012-11-16 MED ORDER — POTASSIUM CHLORIDE ER 10 MEQ PO TBCR: EXTENDED_RELEASE_TABLET | ORAL | Status: DC

## 2012-11-16 NOTE — Telephone Encounter (Signed)
Please call patient, she should take trileptal 150mg  bid. New Rx was called in.

## 2012-11-16 NOTE — Telephone Encounter (Signed)
Patient states her bp is high and staying high, 188 on the top, 177, 180,  165/93, 145/87, 160/76, 139/77, 165.87, 150/80, pulse is 103, she thinks she needs to come off of one of her meds, please advise

## 2012-11-16 NOTE — Addendum Note (Signed)
Addended by: Levert Feinstein on: 11/16/2012 01:39 PM   Modules accepted: Orders

## 2012-11-16 NOTE — Telephone Encounter (Signed)
She will stop the midodrine and monitor blood pressure. Currently her standing blood pressure is about 140 or more and not symptomatic. If need be she can reduce the Florinef to 3 tablets next week if the blood pressures continue to be high

## 2012-11-16 NOTE — Telephone Encounter (Signed)
Pt has some questions about this medication oxcarbazine, please clarify dosage.

## 2012-11-16 NOTE — Telephone Encounter (Signed)
To get exact blood pressure readings

## 2012-11-16 NOTE — Telephone Encounter (Signed)
Noted, rx for the midodrine was cancelled at [facility]

## 2012-11-16 NOTE — Telephone Encounter (Signed)
Called patient. Informed.

## 2012-11-19 ENCOUNTER — Telehealth: Payer: Self-pay | Admitting: Endocrinology

## 2012-11-19 ENCOUNTER — Ambulatory Visit: Payer: Medicare Other | Admitting: Endocrinology

## 2012-11-20 ENCOUNTER — Telehealth: Payer: Self-pay | Admitting: *Deleted

## 2012-11-20 NOTE — Telephone Encounter (Signed)
Pt says her bp was 158/91 sitting, 136/87 standing.  154/81 sitting, 131/74 standing, this morning 170/96 sitting, dropped 159/92. She took on Midodrine, 114/63, then dropped to 86/45. It started going up after the midodrine, she wants to know if she can completely stop the midodrine and take an extra florinef?

## 2012-11-20 NOTE — Telephone Encounter (Signed)
noted 

## 2012-11-20 NOTE — Telephone Encounter (Signed)
She was supposed to have stopped midodrine how many Florinef is she taking now?

## 2012-11-20 NOTE — Telephone Encounter (Signed)
Patient says that her blood pressure dropped when she left off her midodrine last Friday and has been taking 2 a day Advised her to leave off midodrine  and start taking extra Florinef when her blood pressure is low normal

## 2012-11-20 NOTE — Telephone Encounter (Signed)
She is taking 4, wants to know if she can increase to 5?

## 2012-11-22 ENCOUNTER — Telehealth: Payer: Self-pay | Admitting: Neurology

## 2012-11-22 NOTE — Telephone Encounter (Signed)
I have called her, she is taking Gabapentin 800mg  tid, I have advised her to increase trileptal 150mg  tid, then to 300mg  bid.   She complains of bilateral leg pain.

## 2012-11-26 ENCOUNTER — Telehealth: Payer: Self-pay | Admitting: Neurology

## 2012-11-26 MED ORDER — OXCARBAZEPINE 150 MG PO TABS: 150.0000 mg | ORAL_TABLET | Freq: Three times a day (TID) | ORAL | Status: DC

## 2012-11-26 NOTE — Telephone Encounter (Signed)
Rx updated per previous phone note.

## 2012-11-29 ENCOUNTER — Other Ambulatory Visit: Payer: Self-pay | Admitting: *Deleted

## 2012-11-29 MED ORDER — HYDROCORTISONE 10 MG PO TABS: ORAL_TABLET | ORAL | Status: DC

## 2012-11-29 MED ORDER — POTASSIUM CHLORIDE ER 10 MEQ PO TBCR: EXTENDED_RELEASE_TABLET | ORAL | Status: DC

## 2012-11-29 MED ORDER — FLUDROCORTISONE ACETATE 0.1 MG PO TABS: ORAL_TABLET | ORAL | Status: DC

## 2012-11-29 MED ORDER — ESTRADIOL 0.05 MG/24HR TD PTTW: MEDICATED_PATCH | TRANSDERMAL | Status: DC

## 2012-12-13 ENCOUNTER — Other Ambulatory Visit: Payer: Self-pay

## 2012-12-14 ENCOUNTER — Other Ambulatory Visit (INDEPENDENT_AMBULATORY_CARE_PROVIDER_SITE_OTHER): Payer: Medicare Other

## 2012-12-14 DIAGNOSIS — E2749 Other adrenocortical insufficiency: Secondary | ICD-10-CM

## 2012-12-14 DIAGNOSIS — E2740 Unspecified adrenocortical insufficiency: Secondary | ICD-10-CM

## 2012-12-14 LAB — COMPREHENSIVE METABOLIC PANEL
AST: 24 U/L (ref 0–37)
Alkaline Phosphatase: 40 U/L (ref 39–117)
BUN: 11 mg/dL (ref 6–23)
CO2: 31 mEq/L (ref 19–32)
Creatinine, Ser: 0.8 mg/dL (ref 0.4–1.2)
GFR: 74.31 mL/min (ref >60.00)
Glucose, Bld: 90 mg/dL (ref 70–99)
Potassium: 3.8 mEq/L (ref 3.5–5.1)
Sodium: 137 mEq/L (ref 135–145)
Total Bilirubin: 0.5 mg/dL (ref 0.3–1.2)
Total Protein: 6 g/dL (ref 6.0–8.3)

## 2012-12-17 ENCOUNTER — Telehealth: Payer: Self-pay | Admitting: Neurology

## 2012-12-17 ENCOUNTER — Other Ambulatory Visit: Payer: Medicare Other

## 2012-12-18 ENCOUNTER — Telehealth: Payer: Self-pay | Admitting: Neurology

## 2012-12-18 NOTE — Telephone Encounter (Signed)
Patient call in stating that the prescriptions that Dr. Terrace Arabia sent in was not correct. Patient would like for Dr. Terrace Arabia to send in more prescription for gabapentin and oxcarbazepine for a supply for 3 months faxed to [facility].

## 2012-12-21 ENCOUNTER — Encounter: Payer: Self-pay | Admitting: Endocrinology

## 2012-12-21 ENCOUNTER — Other Ambulatory Visit: Payer: Self-pay

## 2012-12-21 ENCOUNTER — Ambulatory Visit (INDEPENDENT_AMBULATORY_CARE_PROVIDER_SITE_OTHER): Payer: Medicare Other | Admitting: Endocrinology

## 2012-12-21 VITALS — BP 162/82 | HR 68 | Temp 98.7°F | Resp 12 | Ht 63.0 in | Wt 128.2 lb

## 2012-12-21 DIAGNOSIS — R030 Elevated blood-pressure reading, without diagnosis of hypertension: Secondary | ICD-10-CM

## 2012-12-21 DIAGNOSIS — G903 Multi-system degeneration of the autonomic nervous system: Secondary | ICD-10-CM

## 2012-12-21 DIAGNOSIS — I951 Orthostatic hypotension: Secondary | ICD-10-CM

## 2012-12-21 DIAGNOSIS — D649 Anemia, unspecified: Secondary | ICD-10-CM

## 2012-12-21 DIAGNOSIS — E876 Hypokalemia: Secondary | ICD-10-CM

## 2012-12-21 DIAGNOSIS — E2740 Unspecified adrenocortical insufficiency: Secondary | ICD-10-CM

## 2012-12-21 DIAGNOSIS — G238 Other specified degenerative diseases of basal ganglia: Secondary | ICD-10-CM

## 2012-12-21 DIAGNOSIS — L749 Eccrine sweat disorder, unspecified: Secondary | ICD-10-CM

## 2012-12-21 DIAGNOSIS — R5381 Other malaise: Secondary | ICD-10-CM

## 2012-12-21 DIAGNOSIS — L748 Other eccrine sweat disorders: Secondary | ICD-10-CM

## 2012-12-21 DIAGNOSIS — E2749 Other adrenocortical insufficiency: Secondary | ICD-10-CM

## 2012-12-21 MED ORDER — OXCARBAZEPINE 150 MG PO TABS: 300.0000 mg | ORAL_TABLET | Freq: Two times a day (BID) | ORAL | Status: DC

## 2012-12-21 MED ORDER — GABAPENTIN 800 MG PO TABS: 800.0000 mg | ORAL_TABLET | Freq: Three times a day (TID) | ORAL | Status: DC

## 2012-12-21 NOTE — Patient Instructions (Addendum)
Try Florinef 3 daily instead of 4, call if blood pressure lower  Continue potassium supplements and hydrocortisone

## 2012-12-21 NOTE — Progress Notes (Signed)
Patient ID: Katrina Rivera, female   DOB: 1945-05-02, 67 y.o.   MRN: [id number]  Subjective:     Patient ID: Katrina Rivera, female   DOB: 11/22/1945, 67 y.o.   MRN: [id number]  Chief complaint: Followup  PAST history: She has had long-standing problems with orthostatic hypotension and also hyponatremia. Has been diagnosed with dysautonomia and has multiple problems related to autonomic neuropathy. Also has been diagnosed with secondary adrenal insufficiency  She has been on Florinef since 2004 usually takingt 3 tablets daily along with 5 tablets of potassium which had previously controlled her symptoms well . She apparently does better with the regimen of one tablet in the morning and 2 in the evening. Blood pressure tends to get high if she increases Florinef by one tablet.  Because of persistent orthostatic symptoms she had been given midodrine in  05/28/12.   RECENT history:  She has had medication adjustments done frequently over the last year for balancing out her blood pressure. Apparently because of higher blood pressure readings with only one tablet a day of midodrine she tried taking 4 tablets a day of Florinef. However with this her blood pressure has gone up more recently. No overt lightheadedness orthostatic drop in blood pressure with doing this She has been monitoring her blood pressure  about once a  week . Recent readings are about 150-155 with only a 10 mm drop on standing   Adrenal Insufficiency:  Adrenal Insufficiency Likely to be secondary to pituitary dysfunction, confirmed by 24 hour urine free cortisol which was only 3.  Prior testing includes Cortrosyn test baseline with stimulated level of 15.7, baseline 3.9.  The response to therapy was Improved symptoms with better appetite, no further weight loss and less frequent nausea.At first did not tolerate hydrocortisone and was given prednisone. Was having abdominal distress with taking prednisone at breakfast and also had  insomnia, stomach pain, nausea and is back on hydrocortisone   Taking replacement doses of hydrocortisone now at lunch and supper and is tolerating this well. Concerned about weight gain.   General Endocrinology:    Pituitary: She  did have a low free T4 at one point but did not tolerate thyroxine supplementation and free T4 is  now consistently normal without supplement  Increased sweating:  she gets hot flashes and periodic sweating in day. She is not complaining of worsening episodes of marked sweating. Did not tolerate propantheline. Had been slightly better with Climara 0.1 mg and subsequently  taking Vivelle but only 0.05 mg twice a week; Stopped this because of cost. Effexor was also tried but she did not tolerate this and also sweating was no better She was started on clonidine patches on the last visit and do symptomatically better Currently she is not on HRT    Review of Systems   Genitourinary: Positive for difficulty urinating.       She was taking urecholine for her  neurogenic bladder  but stopped this because of excessive salivation Neurological: Positive for numbness.       Has peripheral neuropathy, symptomatic of unclear etiology and followed by neurologist  She is again complaining of significant amount of pain in her feet and legs and has not had relief with large doses of gabapentin. Has also tried Cymbalta and Lyrica. She  is not taking a compounded preparation given by her pain specialist including lidocaine  She was found to be anemic and was started on iron by PCP   Labs:  No visits  with results within 1 Week(s) from this visit. Latest known visit with results is:  Appointment on 12/14/2012  Component Date Value Range Status  . Sodium 12/14/2012 137  135 - 145 mEq/L Final  . Potassium 12/14/2012 3.8  3.5 - 5.1 mEq/L Final  . Chloride 12/14/2012 101  96 - 112 mEq/L Final  . CO2 12/14/2012 31  19 - 32 mEq/L Final  . Glucose, Bld 12/14/2012 90  70 - 99 mg/dL  Final  . BUN 16/11/9602 11  6 - 23 mg/dL Final  . Creatinine, Ser 12/14/2012 0.8  0.4 - 1.2 mg/dL Final  . Total Bilirubin 12/14/2012 0.5  0.3 - 1.2 mg/dL Final  . Alkaline Phosphatase 12/14/2012 40  39 - 117 U/L Final  . AST 12/14/2012 24  0 - 37 U/L Final  . ALT 12/14/2012 22  0 - 35 U/L Final  . Total Protein 12/14/2012 6.0  6.0 - 8.3 g/dL Final  . Albumin 54/10/8117 3.5  3.5 - 5.2 g/dL Final  . Calcium 14/78/2956 8.8  8.4 - 10.5 mg/dL Final  . GFR 21/30/8657 74.31  >60.00 mL/min Final       Objective:   Physical Exam  BP 162/82  Pulse 68  Temp(Src) 98.7 F (37.1 C)  Resp 12  Ht 5\' 3"  (1.6 m)  Wt 128 lb 3.2 oz (58.151 kg)  BMI 22.72 kg/m2  SpO2 98%     Standing blood pressure 170/72  No cushingoid features on exam  No pedal edema  Assessment:      Orthostatic dysautonomic hypotension syndrome:   Her symptoms are controlled but she is having higher blood pressure with taking 4 tablets of Florinef again Also probably having some fluid retention because of her weight gain She does not appear to be orthostatic with current regimen Also her pulse rate is relatively better, possibly from stopping midodrine but also starting clonidine patches  Weight gain: She does not have clinical features of cortisol excess  Hyperhidrosis: This appears to be less problematic now  With using Catapres patch. She has not benefited from of hormone replacement much.  Also did not tolerate Effexor as well as propantheline which caused bladder difficulties  HYPOKALEMIA: Controlled,  most likely is from high dose Florinef and is on  potassium supplements   Plan:        Reduce Florinef to 3 tablets a day and consider adding midodrine again if having orthostasis Continue current potassium supplements  Consider reducing hydrocortisone by a half tablet if she continues to gain weight despite reducing Florinef  To reconsider using Climara weekly patch for sweating if she has worsening  symptoms  Katrina Rivera

## 2012-12-24 DIAGNOSIS — E876 Hypokalemia: Secondary | ICD-10-CM | POA: Insufficient documentation

## 2013-01-01 ENCOUNTER — Telehealth: Payer: Self-pay | Admitting: *Deleted

## 2013-01-01 NOTE — Telephone Encounter (Signed)
Pt had her cortisol level checked at Dr. Johnathan Hausen office and they told her her levels were low, she said they were suppose to fax [place] the labs and she wanted to know what to do about it?

## 2013-01-01 NOTE — Telephone Encounter (Signed)
We do not check cortisol levels on patients taking hydrocortisone as it is not accurate

## 2013-01-07 NOTE — Telephone Encounter (Signed)
Explain to the patient that prednisone interferes with the measurement of cortisol and this should not have been tested by her PCP. She is already getting enough treatment for her low cortisol

## 2013-01-08 ENCOUNTER — Other Ambulatory Visit: Payer: Self-pay | Admitting: Neurological Surgery

## 2013-01-13 ENCOUNTER — Other Ambulatory Visit: Payer: Self-pay

## 2013-01-13 MED ORDER — OXCARBAZEPINE 150 MG PO TABS: 300.0000 mg | ORAL_TABLET | Freq: Two times a day (BID) | ORAL | Status: DC

## 2013-01-13 NOTE — Telephone Encounter (Signed)
Pharmacy requested new rx for 90 day supply, previous Rx for #180 would only last 45 days.

## 2013-01-15 ENCOUNTER — Telehealth: Payer: Self-pay | Admitting: Neurology

## 2013-01-15 MED ORDER — GABAPENTIN 400 MG PO CAPS: 800.0000 mg | ORAL_CAPSULE | Freq: Three times a day (TID) | ORAL | Status: DC

## 2013-01-15 MED ORDER — OXCARBAZEPINE 300 MG PO TABS: 300.0000 mg | ORAL_TABLET | Freq: Two times a day (BID) | ORAL | Status: DC

## 2013-01-15 NOTE — Telephone Encounter (Signed)
Patient called and is upset about how she has not heard anything back regarding her Gabapentin script. Patient says [facility] faxed [place] about how she needs 400 mg capsules because her 800 mg ones are too big and she is having difficulty swallowing them despite trying to cut them in half. Patient also called about Oxcarbazepin and how she keeps getting 150 mg and has to pay more per month. Please call.

## 2013-01-15 NOTE — Telephone Encounter (Signed)
I see the patient called last month saying the her Rx's were sent wrong and she wanted them resent for 90 days, specifically asking for Gabapentin 800mg  and Oxcarbazepine.  I do not see a call since then.  I called patient back.  She said she would like the 800mg  changed to 400mg  taking two per dose because she gets choked on the large 800mg  pills.  As well, she said she wants to get the Oxcarb changed from 150mg  to 300mg  so she doesn't have to take as many tablets.  Says the pharmacy keeps sending her 45 day supply of meds instead of 90.  They just requested refills on 150mg  over the weekend, which we approved.  Told patient I will call the pharmacy and see if they can cancel that oreder so we can change the med to 300mg .  I called [facility].  I spoke to [place], who transferred me to customer service.  They could not help me and asked me to call back.  I called again.  Spoke with Janell.  She cancelled the order for 150mg .  I have updated both Rx's and resent them for 90 days per patient request.  The dose will remain the same.

## 2013-01-16 ENCOUNTER — Encounter (HOSPITAL_COMMUNITY): Payer: Self-pay | Admitting: Pharmacy Technician

## 2013-01-16 ENCOUNTER — Telehealth: Payer: Self-pay | Admitting: *Deleted

## 2013-01-16 NOTE — Telephone Encounter (Signed)
She can ask the surgeon to consult me when she is in the hospital

## 2013-01-16 NOTE — Telephone Encounter (Signed)
Pt is going in on the 18th for back surgery. She wants you to do something where they don't try to change her medication (potassium and BP). She said last time they got her meds all out of whack and she doesn't want it to happen again, she's not sure what you can do? CB# [phone]

## 2013-01-18 NOTE — Pre-Procedure Instructions (Signed)
[name]  01/18/2013   Your procedure is scheduled on: Thursday, December 18th.  Report to [facility], Main Entrance or Entrance "A"at 5:30AM.  Call this number if you have problems the morning of surgery: [phone]   Remember:   Do not eat food or drink liquids after midnight.   Take these medicines the morning of surgery with A SIP OF WATER:Gabapentin,  Prevacid, Florinef, Trileptal.  May use Flonase.    Do not wear jewelry, make-up or nail polish.  Do not wear lotions, powders, or perfumes. You may wear deodorant.  Do not shave 48 hours prior to surgery.   Do not bring valuables to the hospital.  [facility] is not responsible  for any belongings or valuables.               Contacts, dentures or bridgework may not be worn into surgery.  Leave suitcase in the car. After surgery it may be brought to your room.  For patients admitted to the hospital, discharge time is determined by your treatment team.               Patients discharged the day of surgery will not be allowed to drive home.  Name and phone number of your driver: -   Special Instructions: Shower using CHG 2 nights before surgery and the night before surgery.  If you shower the day of surgery use CHG.  Use special wash - you have one bottle of CHG for all showers.  You should use approximately 1/3 of the bottle for each shower.   Please read over the following fact sheets that you were given: Pain Booklet, Coughing and Deep Breathing and Surgical Site Infection Prevention

## 2013-01-21 ENCOUNTER — Encounter (HOSPITAL_COMMUNITY)
Admission: RE | Admit: 2013-01-21 | Discharge: 2013-01-21 | Disposition: A | Discharge status: Home / Self Care | Payer: Medicare Other | Source: Ambulatory Visit | Attending: Neurological Surgery | Admitting: Neurological Surgery

## 2013-01-21 ENCOUNTER — Encounter (HOSPITAL_COMMUNITY): Payer: Self-pay

## 2013-01-21 DIAGNOSIS — Z01812 Encounter for preprocedural laboratory examination: Secondary | ICD-10-CM | POA: Insufficient documentation

## 2013-01-21 DIAGNOSIS — Z01818 Encounter for other preprocedural examination: Secondary | ICD-10-CM | POA: Insufficient documentation

## 2013-01-21 DIAGNOSIS — Z01810 Encounter for preprocedural cardiovascular examination: Secondary | ICD-10-CM | POA: Insufficient documentation

## 2013-01-21 HISTORY — DX: Headache: R51

## 2013-01-21 HISTORY — DX: Anxiety disorder, unspecified: F41.9

## 2013-01-21 HISTORY — DX: Major depressive disorder, single episode, unspecified: F32.9

## 2013-01-21 HISTORY — DX: Anemia, unspecified: D64.9

## 2013-01-21 HISTORY — DX: Hypotension, unspecified: I95.9

## 2013-01-21 HISTORY — DX: Gastro-esophageal reflux disease without esophagitis: K21.9

## 2013-01-21 HISTORY — DX: Depression, unspecified: F32.A

## 2013-01-21 LAB — CBC WITH DIFFERENTIAL/PLATELET
Basophils Absolute: 0 10*3/uL (ref 0.0–0.1)
Basophils Relative: 0 % (ref 0–1)
HCT: 33.2 % — ABNORMAL LOW (ref 36.0–46.0)
Lymphocytes Relative: 30 % (ref 12–46)
MCHC: 32.5 g/dL (ref 30.0–36.0)
Monocytes Absolute: 0.7 10*3/uL (ref 0.1–1.0)
Monocytes Relative: 9 % (ref 3–12)
Neutro Abs: 4.7 10*3/uL (ref 1.7–7.7)
Platelets: 250 10*3/uL (ref 150–400)
RDW: 13.8 % (ref 11.5–15.5)
WBC: 7.9 10*3/uL (ref 4.0–10.5)

## 2013-01-21 LAB — BASIC METABOLIC PANEL
BUN: 10 mg/dL (ref 6–23)
Calcium: 8.6 mg/dL (ref 8.4–10.5)
Chloride: 102 mEq/L (ref 96–112)
Creatinine, Ser: 0.78 mg/dL (ref 0.50–1.10)
GFR calc Af Amer: >90 mL/min (ref >90)
GFR calc non Af Amer: 86 mL/min — ABNORMAL LOW (ref >90)

## 2013-01-21 LAB — SURGICAL PCR SCREEN
MRSA, PCR: NEGATIVE
Staphylococcus aureus: NEGATIVE

## 2013-01-21 LAB — PROTIME-INR
INR: 0.89 (ref 0.00–1.49)
Prothrombin Time: 11.9 seconds (ref 11.6–15.2)

## 2013-01-21 NOTE — Pre-Procedure Instructions (Signed)
Katrina Rivera  01/21/2013   Your procedure is scheduled on:  01/24/2013- Thursday  Report to [facility] Short Stay [facility]  2 * 3   MAIN ENTRANCE- A at 5:30 AM.  Call this number if you have problems the morning of surgery: [phone]   Remember:   Do not eat food or drink liquids after midnight.   Take these medicines the morning of surgery with A SIP OF WATER: Gabapentin, Trileptal, Lexapro, Prevacid,  Florinef   Do not wear jewelry, make-up or nail polish.  Do not wear lotions, powders, or perfumes. You may wear deodorant.             Do not shave 48 hrs. Before showering with special soap   Do not bring valuables to the hospital.  [facility] is not responsible                  for any belongings or valuables.               Contacts, dentures or bridgework may not be worn into surgery.  Leave suitcase in the car. After surgery it may be brought to your room.  For patients admitted to the hospital, discharge time is determined by your                treatment team.               Patients discharged the day of surgery will not be allowed to drive  home.  Name and phone number of your driver: with Spouse  Special Instructions: Shower using CHG 2 nights before surgery and the night before surgery.  If you shower the day of surgery use CHG.  Use special wash - you have one bottle of CHG for all showers.  You should use approximately 1/3 of the bottle for each shower.   Please read over the following fact sheets that you were given: Pain Booklet, Coughing and Deep Breathing, MRSA Information and Surgical Site Infection Prevention

## 2013-01-21 NOTE — Progress Notes (Signed)
Call to A. Zelenak,PAC, reported BP after repeat  BP was still elevated. Review of chart was done.

## 2013-01-21 NOTE — Progress Notes (Signed)
Pt. Given both BP 's that were taken here in PAT appt.  Pt. Advised to continue to track BP at home & if elevated to call MD- Lucianne Muss tomorrow. Pt. Verbalizes an understanding.

## 2013-01-21 NOTE — Progress Notes (Addendum)
Anesthesia Chart Review:  Patient is a 67 year old female scheduled for one level lumbar laminectomy/decompression microdiskectomy on 01/24/13 by Dr. Yetta Barre.  History includes MS (Dr. 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 PCP is Dr. Holley Bouche.  Endocrinologist is Dr. Reather Littler. (She says that he is the primary person who deals BP.)  He is aware of plans for surgery.  Patient would like him to follow her post-operatively, so his office told her to have Dr. Yetta Barre consult him during her hospitalization.  BP today at PAT was 190s/70s.  She reports "white coat syndrome."  Her SBP at home is typically ~ 150s.  She is still on Florinef, but is off of midodrine. Her Florinef was actually decreased in 12/2012 to help with elevated BP readings.  She has not taken any medications yet this morning.  I looked back on several of her last office visits in Epic this year.  BP was been anywhere from 110s-180s/70-80s--with average being 140-160/70s.  She is going to check her BP at home over the next few days and let Dr. Lucianne Muss know if readings are staying elevated. I've also sent him a staff message regarding her elevated BP and plan to follow-up with patient later this week.  I also updated Erie Noe at Dr. Yetta Barre' office.     EKG on 01/21/13 showed NSR, PACs, minimal voltage criteria for LVH.  CXR on 01/21/13 showed no active cardiopulmonary disease.  Preoperative labs noted.    Velna Ochs [facility]/Anesthesiology Phone [phone] 01/21/2013 2:44 PM  Addendum: 01/23/2013 11:13 AM Received a staff message from Dr. Lucianne Muss.   He is aware of elevated BP at PAT, but as above knows her SBP is typically ~ 150s and orthostasis has been her primary issue so no further preoperative recommendations given.  I called patient. She has been checking her BP at home regularly.  Her SBP one morning was ~ 160, but otherwise SBP ~ 140-150s.  DBP has been < 90.

## 2013-01-23 MED ORDER — CEFAZOLIN SODIUM-DEXTROSE 2-3 GM-% IV SOLR
2.0000 g | INTRAVENOUS | Status: AC
  Administered 2013-01-24: 2 g via INTRAVENOUS
  Filled 2013-01-23: qty 50

## 2013-01-24 ENCOUNTER — Encounter (HOSPITAL_COMMUNITY): Payer: Medicare Other | Admitting: Vascular Surgery

## 2013-01-24 ENCOUNTER — Inpatient Hospital Stay (HOSPITAL_COMMUNITY): Payer: Medicare Other

## 2013-01-24 ENCOUNTER — Encounter (HOSPITAL_COMMUNITY): Payer: Self-pay | Admitting: *Deleted

## 2013-01-24 ENCOUNTER — Encounter (HOSPITAL_COMMUNITY)
Admission: RE | Disposition: A | Discharge status: Home / Self Care | Payer: Self-pay | Source: Ambulatory Visit | Attending: Neurological Surgery

## 2013-01-24 ENCOUNTER — Inpatient Hospital Stay (HOSPITAL_COMMUNITY): Payer: Medicare Other | Admitting: Anesthesiology

## 2013-01-24 ENCOUNTER — Ambulatory Visit (HOSPITAL_COMMUNITY)
Admission: RE | Admit: 2013-01-24 | Discharge: 2013-01-25 | Disposition: A | Discharge status: Home / Self Care | Payer: Medicare Other | Source: Ambulatory Visit | Attending: Neurological Surgery | Admitting: Neurological Surgery

## 2013-01-24 DIAGNOSIS — M5137 Other intervertebral disc degeneration, lumbosacral region: Secondary | ICD-10-CM | POA: Insufficient documentation

## 2013-01-24 DIAGNOSIS — Z9889 Other specified postprocedural states: Secondary | ICD-10-CM

## 2013-01-24 DIAGNOSIS — K219 Gastro-esophageal reflux disease without esophagitis: Secondary | ICD-10-CM | POA: Insufficient documentation

## 2013-01-24 DIAGNOSIS — M129 Arthropathy, unspecified: Secondary | ICD-10-CM | POA: Insufficient documentation

## 2013-01-24 DIAGNOSIS — M51379 Other intervertebral disc degeneration, lumbosacral region without mention of lumbar back pain or lower extremity pain: Secondary | ICD-10-CM | POA: Insufficient documentation

## 2013-01-24 DIAGNOSIS — G35 Multiple sclerosis: Secondary | ICD-10-CM | POA: Insufficient documentation

## 2013-01-24 DIAGNOSIS — F3289 Other specified depressive episodes: Secondary | ICD-10-CM | POA: Insufficient documentation

## 2013-01-24 DIAGNOSIS — F411 Generalized anxiety disorder: Secondary | ICD-10-CM | POA: Insufficient documentation

## 2013-01-24 DIAGNOSIS — IMO0001 Reserved for inherently not codable concepts without codable children: Secondary | ICD-10-CM | POA: Insufficient documentation

## 2013-01-24 DIAGNOSIS — F329 Major depressive disorder, single episode, unspecified: Secondary | ICD-10-CM | POA: Insufficient documentation

## 2013-01-24 DIAGNOSIS — M5126 Other intervertebral disc displacement, lumbar region: Principal | ICD-10-CM | POA: Insufficient documentation

## 2013-01-24 DIAGNOSIS — G709 Myoneural disorder, unspecified: Secondary | ICD-10-CM | POA: Insufficient documentation

## 2013-01-24 HISTORY — PX: LUMBAR LAMINECTOMY/DECOMPRESSION MICRODISCECTOMY: SHX5026

## 2013-01-24 SURGERY — LUMBAR LAMINECTOMY/DECOMPRESSION MICRODISCECTOMY 1 LEVEL
Anesthesia: General | Site: Back | Laterality: Left

## 2013-01-24 MED ORDER — CLONAZEPAM 0.5 MG PO TABS
0.5000 mg | ORAL_TABLET | Freq: Every day | ORAL | Status: DC
Start: 2013-01-24 — End: 2013-01-25
  Administered 2013-01-24: 0.5 mg via ORAL
  Filled 2013-01-24: qty 1

## 2013-01-24 MED ORDER — POTASSIUM CHLORIDE IN NACL 20-0.9 MEQ/L-% IV SOLN
INTRAVENOUS | Status: DC
  Filled 2013-01-24 (×3): qty 1000

## 2013-01-24 MED ORDER — PHENYLEPHRINE HCL 10 MG/ML IJ SOLN
INTRAMUSCULAR | Status: DC | PRN
  Administered 2013-01-24: 80 ug via INTRAVENOUS

## 2013-01-24 MED ORDER — LACTATED RINGERS IV SOLN
INTRAVENOUS | Status: DC | PRN
  Administered 2013-01-24: 07:00:00 via INTRAVENOUS

## 2013-01-24 MED ORDER — HYDROMORPHONE HCL PF 1 MG/ML IJ SOLN
INTRAMUSCULAR | Status: AC
  Filled 2013-01-24: qty 1

## 2013-01-24 MED ORDER — METHOCARBAMOL 500 MG PO TABS
500.0000 mg | ORAL_TABLET | Freq: Four times a day (QID) | ORAL | Status: DC | PRN
  Administered 2013-01-24 – 2013-01-25 (×2): 500 mg via ORAL
  Filled 2013-01-24 (×2): qty 1

## 2013-01-24 MED ORDER — OXYCODONE HCL 5 MG PO TABS
ORAL_TABLET | ORAL | Status: AC
  Filled 2013-01-24: qty 1

## 2013-01-24 MED ORDER — TAMSULOSIN HCL 0.4 MG PO CAPS
0.4000 mg | ORAL_CAPSULE | Freq: Every day | ORAL | Status: DC
  Administered 2013-01-25: 0.4 mg via ORAL
  Filled 2013-01-24 (×2): qty 1

## 2013-01-24 MED ORDER — DEXAMETHASONE 4 MG PO TABS
4.0000 mg | ORAL_TABLET | Freq: Four times a day (QID) | ORAL | Status: DC
  Administered 2013-01-24 – 2013-01-25 (×4): 4 mg via ORAL
  Filled 2013-01-24 (×8): qty 1

## 2013-01-24 MED ORDER — ACETAMINOPHEN 325 MG PO TABS: 650.0000 mg | ORAL_TABLET | ORAL | Status: DC | PRN

## 2013-01-24 MED ORDER — BUPRENORPHINE 10 MCG/HR TD PTWK: 10.0000 ug | MEDICATED_PATCH | TRANSDERMAL | Status: DC

## 2013-01-24 MED ORDER — EPHEDRINE SULFATE 50 MG/ML IJ SOLN
INTRAMUSCULAR | Status: DC | PRN
  Administered 2013-01-24 (×2): 15 mg via INTRAVENOUS

## 2013-01-24 MED ORDER — HYDROMORPHONE HCL PF 1 MG/ML IJ SOLN
0.2500 mg | INTRAMUSCULAR | Status: DC | PRN
  Administered 2013-01-24 (×4): 0.5 mg via INTRAVENOUS

## 2013-01-24 MED ORDER — SODIUM CHLORIDE 0.9 % IR SOLN
Status: DC | PRN
  Administered 2013-01-24: 08:00:00

## 2013-01-24 MED ORDER — PROPOFOL 10 MG/ML IV BOLUS
INTRAVENOUS | Status: DC | PRN
  Administered 2013-01-24: 150 mg via INTRAVENOUS

## 2013-01-24 MED ORDER — OXCARBAZEPINE 300 MG PO TABS
300.0000 mg | ORAL_TABLET | Freq: Two times a day (BID) | ORAL | Status: DC
  Administered 2013-01-24 – 2013-01-25 (×2): 300 mg via ORAL
  Filled 2013-01-24 (×3): qty 1

## 2013-01-24 MED ORDER — PHENOL 1.4 % MT LIQD: 1.0000 | OROMUCOSAL | Status: DC | PRN

## 2013-01-24 MED ORDER — 0.9 % SODIUM CHLORIDE (POUR BTL) OPTIME
TOPICAL | Status: DC | PRN
  Administered 2013-01-24: 1000 mL

## 2013-01-24 MED ORDER — ROCURONIUM BROMIDE 100 MG/10ML IV SOLN
INTRAVENOUS | Status: DC | PRN
  Administered 2013-01-24: 50 mg via INTRAVENOUS

## 2013-01-24 MED ORDER — HYDROCORTISONE SOD SUCCINATE 100 MG IJ SOLR
INTRAMUSCULAR | Status: DC | PRN
  Administered 2013-01-24: 100 mg via INTRAVENOUS

## 2013-01-24 MED ORDER — ONDANSETRON HCL 4 MG/2ML IJ SOLN: 4.0000 mg | INTRAMUSCULAR | Status: DC | PRN

## 2013-01-24 MED ORDER — GABAPENTIN 400 MG PO CAPS
800.0000 mg | ORAL_CAPSULE | Freq: Three times a day (TID) | ORAL | Status: DC
  Administered 2013-01-24 – 2013-01-25 (×3): 800 mg via ORAL
  Filled 2013-01-24 (×5): qty 2

## 2013-01-24 MED ORDER — SODIUM CHLORIDE 0.9 % IJ SOLN
3.0000 mL | Freq: Two times a day (BID) | INTRAMUSCULAR | Status: DC
  Administered 2013-01-24 (×2): 3 mL via INTRAVENOUS

## 2013-01-24 MED ORDER — PANTOPRAZOLE SODIUM 20 MG PO TBEC
20.0000 mg | DELAYED_RELEASE_TABLET | Freq: Every day | ORAL | Status: DC
  Administered 2013-01-25: 20 mg via ORAL
  Filled 2013-01-24: qty 1

## 2013-01-24 MED ORDER — MENTHOL 3 MG MT LOZG: 1.0000 | LOZENGE | OROMUCOSAL | Status: DC | PRN

## 2013-01-24 MED ORDER — HYDROCODONE-ACETAMINOPHEN 5-325 MG PO TABS
1.0000 | ORAL_TABLET | ORAL | Status: DC | PRN
  Administered 2013-01-24 – 2013-01-25 (×3): 2 via ORAL
  Filled 2013-01-24 (×3): qty 2

## 2013-01-24 MED ORDER — NEOSTIGMINE METHYLSULFATE 1 MG/ML IJ SOLN
INTRAMUSCULAR | Status: DC | PRN
  Administered 2013-01-24: 4 mg via INTRAVENOUS

## 2013-01-24 MED ORDER — LIDOCAINE HCL (CARDIAC) 20 MG/ML IV SOLN
INTRAVENOUS | Status: DC | PRN
  Administered 2013-01-24: 70 mg via INTRAVENOUS

## 2013-01-24 MED ORDER — NITROFURANTOIN MACROCRYSTAL 100 MG PO CAPS
100.0000 mg | ORAL_CAPSULE | Freq: Every day | ORAL | Status: DC
  Administered 2013-01-24: 100 mg via ORAL
  Filled 2013-01-24 (×2): qty 1

## 2013-01-24 MED ORDER — CELECOXIB 200 MG PO CAPS
200.0000 mg | ORAL_CAPSULE | Freq: Two times a day (BID) | ORAL | Status: DC
  Administered 2013-01-24 – 2013-01-25 (×2): 200 mg via ORAL
  Filled 2013-01-24 (×3): qty 1

## 2013-01-24 MED ORDER — OXYCODONE HCL 5 MG PO TABS
5.0000 mg | ORAL_TABLET | Freq: Once | ORAL | Status: AC | PRN
Start: 2013-01-24 — End: 2013-01-24
  Administered 2013-01-24: 5 mg via ORAL

## 2013-01-24 MED ORDER — HEMOSTATIC AGENTS (NO CHARGE) OPTIME
TOPICAL | Status: DC | PRN
  Administered 2013-01-24: 1 via TOPICAL

## 2013-01-24 MED ORDER — ONDANSETRON HCL 4 MG/2ML IJ SOLN
INTRAMUSCULAR | Status: DC | PRN
  Administered 2013-01-24: 4 mg via INTRAVENOUS

## 2013-01-24 MED ORDER — MIDAZOLAM HCL 5 MG/5ML IJ SOLN
INTRAMUSCULAR | Status: DC | PRN
  Administered 2013-01-24 (×2): 1 mg via INTRAVENOUS

## 2013-01-24 MED ORDER — HYDROCORTISONE 10 MG PO TABS
10.0000 mg | ORAL_TABLET | Freq: Every day | ORAL | Status: DC
  Administered 2013-01-24: 10 mg via ORAL
  Filled 2013-01-24 (×2): qty 1

## 2013-01-24 MED ORDER — CEFAZOLIN SODIUM 1-5 GM-% IV SOLN
1.0000 g | Freq: Three times a day (TID) | INTRAVENOUS | Status: AC
  Administered 2013-01-24 (×2): 1 g via INTRAVENOUS
  Filled 2013-01-24 (×2): qty 50

## 2013-01-24 MED ORDER — SODIUM CHLORIDE 0.9 % IJ SOLN: 3.0000 mL | INTRAMUSCULAR | Status: DC | PRN

## 2013-01-24 MED ORDER — ONDANSETRON HCL 4 MG/2ML IJ SOLN: 4.0000 mg | Freq: Four times a day (QID) | INTRAMUSCULAR | Status: DC | PRN

## 2013-01-24 MED ORDER — FLUDROCORTISONE ACETATE 0.1 MG PO TABS
0.2000 mg | ORAL_TABLET | Freq: Every day | ORAL | Status: DC
  Administered 2013-01-24: 0.2 mg via ORAL
  Filled 2013-01-24 (×2): qty 2

## 2013-01-24 MED ORDER — BUPIVACAINE HCL (PF) 0.25 % IJ SOLN
INTRAMUSCULAR | Status: DC | PRN
  Administered 2013-01-24: 14 mL

## 2013-01-24 MED ORDER — ESCITALOPRAM OXALATE 10 MG PO TABS
10.0000 mg | ORAL_TABLET | Freq: Every day | ORAL | Status: DC
  Administered 2013-01-25: 10 mg via ORAL
  Filled 2013-01-24 (×2): qty 1

## 2013-01-24 MED ORDER — FENTANYL CITRATE 0.05 MG/ML IJ SOLN
INTRAMUSCULAR | Status: DC | PRN
  Administered 2013-01-24: 50 ug via INTRAVENOUS

## 2013-01-24 MED ORDER — FENTANYL CITRATE 0.05 MG/ML IJ SOLN
INTRAMUSCULAR | Status: DC | PRN
  Administered 2013-01-24: 50 ug via INTRAVENOUS
  Administered 2013-01-24: 100 ug via INTRAVENOUS
  Administered 2013-01-24: 50 ug via INTRAVENOUS

## 2013-01-24 MED ORDER — FENTANYL CITRATE 0.05 MG/ML IJ SOLN
INTRAMUSCULAR | Status: AC
  Filled 2013-01-24: qty 2

## 2013-01-24 MED ORDER — HYDROCORTISONE 10 MG PO TABS
20.0000 mg | ORAL_TABLET | Freq: Every day | ORAL | Status: DC
  Filled 2013-01-24: qty 2

## 2013-01-24 MED ORDER — METHYLPREDNISOLONE ACETATE 80 MG/ML IJ SUSP
INTRAMUSCULAR | Status: DC | PRN
  Administered 2013-01-24: 80 mg

## 2013-01-24 MED ORDER — ACETAMINOPHEN 650 MG RE SUPP: 650.0000 mg | RECTAL | Status: DC | PRN

## 2013-01-24 MED ORDER — GLYCOPYRROLATE 0.2 MG/ML IJ SOLN
INTRAMUSCULAR | Status: DC | PRN
  Administered 2013-01-24: 0.6 mg via INTRAVENOUS

## 2013-01-24 MED ORDER — THROMBIN 5000 UNITS EX SOLR
CUTANEOUS | Status: DC | PRN
  Administered 2013-01-24 (×2): 5000 [IU] via TOPICAL

## 2013-01-24 MED ORDER — OXYCODONE HCL 5 MG/5ML PO SOLN
5.0000 mg | Freq: Once | ORAL | Status: AC | PRN
Start: 2013-01-24 — End: 2013-01-24

## 2013-01-24 MED ORDER — ARTIFICIAL TEARS OP OINT
TOPICAL_OINTMENT | OPHTHALMIC | Status: DC | PRN
  Administered 2013-01-24: 1 via OPHTHALMIC

## 2013-01-24 MED ORDER — DEXAMETHASONE SODIUM PHOSPHATE 4 MG/ML IJ SOLN
4.0000 mg | Freq: Four times a day (QID) | INTRAMUSCULAR | Status: DC
  Filled 2013-01-24 (×4): qty 1

## 2013-01-24 MED ORDER — MORPHINE SULFATE 2 MG/ML IJ SOLN: 1.0000 mg | INTRAMUSCULAR | Status: DC | PRN

## 2013-01-24 MED ORDER — FLUDROCORTISONE ACETATE 0.1 MG PO TABS
0.1000 mg | ORAL_TABLET | Freq: Every morning | ORAL | Status: DC
  Administered 2013-01-24 – 2013-01-25 (×2): 0.1 mg via ORAL
  Filled 2013-01-24 (×2): qty 1

## 2013-01-24 SURGICAL SUPPLY — 51 items
APL SKNCLS STERI-STRIP NONHPOA (GAUZE/BANDAGES/DRESSINGS) ×1
BAG DECANTER FOR FLEXI CONT (MISCELLANEOUS) ×2 IMPLANT
BENZOIN TINCTURE PRP APPL 2/3 (GAUZE/BANDAGES/DRESSINGS) ×2 IMPLANT
BUR MATCHSTICK NEURO 3.0 LAGG (BURR) ×2 IMPLANT
CANISTER SUCT 3000ML (MISCELLANEOUS) ×2 IMPLANT
CONT SPEC 4OZ CLIKSEAL STRL BL (MISCELLANEOUS) ×2 IMPLANT
DRAPE C-ARM 42X72 X-RAY (DRAPES) ×2 IMPLANT
DRAPE LAPAROTOMY 100X72X124 (DRAPES) ×2 IMPLANT
DRAPE MICROSCOPE ZEISS OPMI (DRAPES) ×2 IMPLANT
DRAPE POUCH INSTRU U-SHP 10X18 (DRAPES) ×2 IMPLANT
DRAPE SURG 17X23 STRL (DRAPES) ×2 IMPLANT
DRESSING TELFA 8X3 (GAUZE/BANDAGES/DRESSINGS) ×2 IMPLANT
DRSG OPSITE 4X5.5 SM (GAUZE/BANDAGES/DRESSINGS) ×2 IMPLANT
DURAPREP 26ML APPLICATOR (WOUND CARE) ×2 IMPLANT
ELECT REM PT RETURN 9FT ADLT (ELECTROSURGICAL) ×2
ELECTRODE REM PT RTRN 9FT ADLT (ELECTROSURGICAL) ×1 IMPLANT
GAUZE SPONGE 4X4 16PLY XRAY LF (GAUZE/BANDAGES/DRESSINGS) IMPLANT
GLOVE BIO SURGEON STRL SZ8 (GLOVE) ×2 IMPLANT
GLOVE BIOGEL PI IND STRL 7.5 (GLOVE) IMPLANT
GLOVE BIOGEL PI IND STRL 8 (GLOVE) IMPLANT
GLOVE BIOGEL PI INDICATOR 7.5 (GLOVE) ×1
GLOVE BIOGEL PI INDICATOR 8 (GLOVE) ×1
GLOVE ECLIPSE 7.5 STRL STRAW (GLOVE) ×1 IMPLANT
GLOVE SURG SS PI 7.0 STRL IVOR (GLOVE) ×2 IMPLANT
GOWN BRE IMP SLV AUR LG STRL (GOWN DISPOSABLE) ×1 IMPLANT
GOWN BRE IMP SLV AUR XL STRL (GOWN DISPOSABLE) ×3 IMPLANT
GOWN STRL REIN 2XL LVL4 (GOWN DISPOSABLE) IMPLANT
HEMOSTAT POWDER KIT SURGIFOAM (HEMOSTASIS) IMPLANT
KIT BASIN OR (CUSTOM PROCEDURE TRAY) ×2 IMPLANT
KIT ROOM TURNOVER OR (KITS) ×2 IMPLANT
NDL HYPO 18GX1.5 BLUNT FILL (NEEDLE) IMPLANT
NDL HYPO 25X1 1.5 SAFETY (NEEDLE) ×1 IMPLANT
NDL SPNL 20GX3.5 QUINCKE YW (NEEDLE) IMPLANT
NEEDLE HYPO 18GX1.5 BLUNT FILL (NEEDLE) ×2 IMPLANT
NEEDLE HYPO 25X1 1.5 SAFETY (NEEDLE) ×2 IMPLANT
NEEDLE SPNL 20GX3.5 QUINCKE YW (NEEDLE) IMPLANT
NS IRRIG 1000ML POUR BTL (IV SOLUTION) ×2 IMPLANT
PACK LAMINECTOMY NEURO (CUSTOM PROCEDURE TRAY) ×2 IMPLANT
PAD ARMBOARD 7.5X6 YLW CONV (MISCELLANEOUS) ×6 IMPLANT
RUBBERBAND STERILE (MISCELLANEOUS) ×4 IMPLANT
SPONGE SURGIFOAM ABS GEL SZ50 (HEMOSTASIS) ×2 IMPLANT
STRIP CLOSURE SKIN 1/2X4 (GAUZE/BANDAGES/DRESSINGS) ×2 IMPLANT
SUT VIC AB 0 CT1 18XCR BRD8 (SUTURE) ×1 IMPLANT
SUT VIC AB 0 CT1 8-18 (SUTURE) ×2
SUT VIC AB 2-0 CP2 18 (SUTURE) ×2 IMPLANT
SUT VIC AB 3-0 SH 8-18 (SUTURE) ×2 IMPLANT
SYR 20ML ECCENTRIC (SYRINGE) ×2 IMPLANT
SYR 5ML LL (SYRINGE) ×1 IMPLANT
TOWEL OR 17X24 6PK STRL BLUE (TOWEL DISPOSABLE) ×2 IMPLANT
TOWEL OR 17X26 10 PK STRL BLUE (TOWEL DISPOSABLE) ×2 IMPLANT
WATER STERILE IRR 1000ML POUR (IV SOLUTION) ×2 IMPLANT

## 2013-01-24 NOTE — Anesthesia Procedure Notes (Signed)
Procedure Name: Intubation Date/Time: 01/24/2013 7:46 AM Performed by: Gayla Medicus Pre-anesthesia Checklist: Patient identified, Patient being monitored, Emergency Drugs available, Timeout performed and Suction available Patient Re-evaluated:Patient Re-evaluated prior to inductionOxygen Delivery Method: Circle system utilized Preoxygenation: Pre-oxygenation with 100% oxygen Intubation Type: IV induction Ventilation: Mask ventilation without difficulty Laryngoscope Size: Mac and 3 Grade View: Grade I Tube type: Oral Tube size: 7.5 mm Number of attempts: 1 Airway Equipment and Method: Stylet Placement Confirmation: ETT inserted through vocal cords under direct vision,  positive ETCO2 and breath sounds checked- equal and bilateral Secured at: 21 cm Tube secured with: Tape Dental Injury: Teeth and Oropharynx as per pre-operative assessment

## 2013-01-24 NOTE — Preoperative (Signed)
Beta Blockers   Reason not to administer Beta Blockers:Not Applicable 

## 2013-01-24 NOTE — Progress Notes (Signed)
Late entry: Heard noise in the bathroom, when checked, noted patient holding on to the rails with her in an almost sitting position in the trash can in front of the commode. She claims she got up unassisted. " I know I'm stubborn and old and don't know better", " I know better but I don't follow well, ask my husband" . Patient keep apologizing about getting up unassisted because she was reminded many times to call for help when ready and not to get up unassisted. Assisted back up by 2 RN, able to walk back to stretcher. Dr. Marikay Alar notified about the above incident.

## 2013-01-24 NOTE — Transfer of Care (Signed)
Immediate Anesthesia Transfer of Care Note  Patient: Katrina Rivera  Procedure(s) Performed: Procedure(s): LUMBAR FIVE TO SACRAL ONE LUMBAR LAMINECTOMY/DECOMPRESSION MICRODISCECTOMY 1 LEVEL (Left)  Patient Location: PACU  Anesthesia Type:General  Level of Consciousness: awake, alert  and oriented  Airway & Oxygen Therapy: Patient Spontanous Breathing and Patient connected to nasal cannula oxygen  Post-op Assessment: Report given to PACU RN, Post -op Vital signs reviewed and stable and Patient moving all extremities X 4  Post vital signs: Reviewed and stable  Complications: No apparent anesthesia complications

## 2013-01-24 NOTE — Progress Notes (Signed)
Patient ID: Katrina Rivera, female   DOB: 03/11/1945, 67 y.o.   MRN: [id number] Doing well with resolution of her preoperative leg pain, no change in preoperative partial footdrop, no new numbness or tingling. She seems pleased.

## 2013-01-24 NOTE — H&P (Signed)
Subjective: Patient is a 67 y.o. female admitted for L L5-S1 extraforaminal microdiskectomy. Onset of symptoms was a few months ago, gradually worsening since that time.  The pain is rated severe, and is located at the across the lower back and radiates to LLE. The pain is described as aching and occurs all day. The symptoms have been progressive. Symptoms are exacerbated by exercise. MRI or CT showed extraforaminal HNP L5-S1 Left.   Past Medical History  Diagnosis Date  . Fibromyalgia   . Restless leg syndrome   . Multiple sclerosis   . Arthritis   . Osteoporosis   . MS (multiple sclerosis) 08/15/2012  . Hypotension   . Depression   . Anxiety   . Anemia   . Headache(784.0)   . GERD (gastroesophageal reflux disease)     Past Surgical History  Procedure Laterality Date  . Appendectomy    . Colectomy    . Cholecystectomy    . Cesarean section      x2  . Abdominal hysterectomy    . Fracture surgery      R leg, rod & screws   . Eye surgery      bilateral - /w IOL    Prior to Admission medications   Medication Sig Start Date End Date Taking? Authorizing Provider  bifidobacterium infantis (ALIGN) capsule Take 1 capsule by mouth daily.   Yes Historical Provider, MD  buprenorphine (BUTRANS) 10 MCG/HR PTWK patch Place 10 mcg onto the skin once a week. On Thursday.   Yes Historical Provider, MD  calcium-vitamin D (OSCAL WITH D) 500-200 MG-UNIT per tablet Take 1 tablet by mouth 2 (two) times daily.   Yes Historical Provider, MD  clonazePAM (KLONOPIN) 0.5 MG tablet Take 0.5 mg by mouth at bedtime.   Yes Historical Provider, MD  docusate sodium (COLACE) 100 MG capsule Take 100 mg by mouth daily.   Yes Historical Provider, MD  escitalopram (LEXAPRO) 10 MG tablet Take 10 mg by mouth daily before breakfast.    Yes Historical Provider, MD  Ferrous Sulfate Dried 200 (65 FE) MG TABS Take 65 mg by mouth daily.   Yes Historical Provider, MD  fludrocortisone (FLORINEF) 0.1 MG tablet Take 0.1-0.2 mg  by mouth 2 (two) times daily. One tablet in the morning and two tablets at night.   Yes Historical Provider, MD  gabapentin (NEURONTIN) 400 MG capsule Take 800 mg by mouth 3 (three) times daily. 01/15/13  Yes Levert Feinstein, MD  hydrocortisone (CORTEF) 10 MG tablet Take 10-20 mg by mouth 2 (two) times daily. Take two tablets at lunch and one at supper.   Yes Historical Provider, MD  ibuprofen (ADVIL,MOTRIN) 200 MG tablet Take 400 mg by mouth 2 (two) times daily as needed (pain).   Yes Historical Provider, MD  lansoprazole (PREVACID) 30 MG capsule Take 30 mg by mouth daily before breakfast.    Yes Historical Provider, MD  Multiple Vitamin (MULITIVITAMIN WITH MINERALS) TABS Take 1 tablet by mouth daily.   Yes Historical Provider, MD  nitrofurantoin (MACRODANTIN) 100 MG capsule Take 100 mg by mouth at bedtime.   Yes Historical Provider, MD  omega-3 acid ethyl esters (LOVAZA) 1 G capsule Take 1 g by mouth 2 (two) times daily.   Yes Historical Provider, MD  Oxcarbazepine (TRILEPTAL) 300 MG tablet Take 300 mg by mouth 2 (two) times daily. 01/15/13  Yes Levert Feinstein, MD  Polyethyl Glycol-Propyl Glycol (SYSTANE OP) Apply 2 drops to eye daily as needed (for dry eyes).  Yes Historical Provider, MD  potassium chloride (K-DUR,KLOR-CON) 10 MEQ tablet Take 20-30 mEq by mouth 2 (two) times daily. Take 3 in the morning and two at bedtime.   Yes Historical Provider, MD  PRESCRIPTION MEDICATION Apply 1 application topically at bedtime. meloxicam 0.5/doxepin 3%/amantadine 3%/dextromethorphan 2%/Lidocaine 2%. Compounded prescription.  Apply at 8pm and repeat at 10pm if needed.   Yes Historical Provider, MD  tamsulosin (FLOMAX) 0.4 MG CAPS capsule Take 0.4 mg by mouth daily after breakfast.   Yes Historical Provider, MD  fluticasone (FLONASE) 50 MCG/ACT nasal spray Place 2 sprays into both nostrils at bedtime as needed for allergies or rhinitis.    Historical Provider, MD   No Known Allergies  History  Substance Use Topics  .  Smoking status: Never Smoker   . Smokeless tobacco: Never Used  . Alcohol Use: No    Family History  Problem Relation Age of Onset  . Hypertension Mother   . Stroke Mother   . Heart attack Father      Review of Systems  Positive ROS: neg  All other systems have been reviewed and were otherwise negative with the exception of those mentioned in the HPI and as above.  Objective: Vital signs in last 24 hours: Temp:  [98.8 F (37.1 C)] 98.8 F (37.1 C) (12/18 0623) Pulse Rate:  [82] 82 (12/18 0623) Resp:  [18] 18 (12/18 0623) BP: (241)/(89) 241/89 mmHg (12/18 0623) SpO2:  [99 %] 99 % (12/18 0623)  General Appearance: Alert, cooperative, no distress, appears stated age Head: Normocephalic, without obvious abnormality, atraumatic Eyes: PERRL, conjunctiva/corneas clear, EOM's intact    Neck: Supple, symmetrical, trachea midline Back: Symmetric, no curvature, ROM normal, no CVA tenderness Lungs:  respirations unlabored Heart: Regular rate and rhythm Abdomen: Soft, non-tender Extremities: Extremities normal, atraumatic, no cyanosis or edema Pulses: 2+ and symmetric all extremities Skin: Skin color, texture, turgor normal, no rashes or lesions  NEUROLOGIC:   Mental status: Alert and oriented x4,  no aphasia, good attention span, fund of knowledge, and memory Motor Exam - grossly normal Sensory Exam - grossly normal Reflexes: 1+ Coordination - grossly normal Gait - grossly normal Balance - grossly normal Cranial Nerves: I: smell Not tested  II: visual acuity  OS: nl    OD: nl  II: visual fields Full to confrontation  II: pupils Equal, round, reactive to light  III,VII: ptosis None  III,IV,VI: extraocular muscles  Full ROM  V: mastication Normal  V: facial light touch sensation  Normal  V,VII: corneal reflex  Present  VII: facial muscle function - upper  Normal  VII: facial muscle function - lower Normal  VIII: hearing Not tested  IX: soft palate elevation  Normal   IX,X: gag reflex Present  XI: trapezius strength  5/5  XI: sternocleidomastoid strength 5/5  XI: neck flexion strength  5/5  XII: tongue strength  Normal    Data Review Lab Results  Component Value Date   WBC 7.9 01/21/2013   HGB 10.8* 01/21/2013   HCT 33.2* 01/21/2013   MCV 88.5 01/21/2013   PLT 250 01/21/2013   Lab Results  Component Value Date   NA 141 01/21/2013   K 3.8 01/21/2013   CL 102 01/21/2013   CO2 31 01/21/2013   BUN 10 01/21/2013   CREATININE 0.78 01/21/2013   GLUCOSE 87 01/21/2013   Lab Results  Component Value Date   INR 0.89 01/21/2013    Assessment/Plan: Patient admitted for L L5-S1 extraforminal diskectomy. Patient has  failed a reasonable attempt at conservative therapy.  I explained the condition and procedure to the patient and answered any questions.  Patient wishes to proceed with procedure as planned. Understands risks/ benefits and typical outcomes of procedure.   Katrina Rivera S 01/24/2013 7:35 AM

## 2013-01-24 NOTE — Anesthesia Postprocedure Evaluation (Signed)
Anesthesia Post Note  Patient: Katrina Rivera  Procedure(s) Performed: Procedure(s) (LRB): LUMBAR FIVE TO SACRAL ONE LUMBAR LAMINECTOMY/DECOMPRESSION MICRODISCECTOMY 1 LEVEL (Left)  Anesthesia type: General  Patient location: PACU  Post pain: Pain level controlled and Adequate analgesia  Post assessment: Post-op Vital signs reviewed, Patient's Cardiovascular Status Stable, Respiratory Function Stable, Patent Airway and Pain level controlled  Last Vitals:  Filed Vitals:   01/24/13 0924  BP: 158/85  Pulse: 78  Temp: 36.3 C  Resp: 10    Post vital signs: Reviewed and stable  Level of consciousness: awake, alert  and oriented  Complications: No apparent anesthesia complications

## 2013-01-24 NOTE — Plan of Care (Signed)
Problem: Consults Goal: Diagnosis - Spinal Surgery Outcome: Completed/Met Date Met:  01/24/13 Microdiscectomy

## 2013-01-24 NOTE — Op Note (Signed)
01/24/2013  9:14 AM  PATIENT:  Katrina Rivera  67 y.o. 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 Newell Coral  ANESTHESIA:   General  EBL: 25 ml  Total I/O In: -  Out: 25 [Blood:25]  BLOOD ADMINISTERED:none  DRAINS: None   SPECIMEN:  No Specimen  INDICATION FOR PROCEDURE: This patient presented with a severe left L5 radiculopathy. She tried medical management and injection therapy without relief. She had an MRI which showed extraforaminal disc protrusion L5-S1 on the left. Consider extraforaminal microdiscectomy versus lumbar fusion and we decided to try external microdiscectomy. Patient understood the risks, benefits, and alternatives and potential outcomes and wished to proceed.  PROCEDURE DETAILS: The patient was taken to the operating room and after induction of adequate generalized endotracheal anesthesia, the patient was rolled into the prone position on the Wilson frame and all pressure points were padded. The lumbar region was cleaned and then prepped with DuraPrep and draped in the usual sterile fashion. 5 cc of local anesthesia was injected and then a dorsal midline incision was made and carried down to the lumbo sacral fascia. The fascia was opened and the paraspinous musculature was taken down in a subperiosteal fashion to expose L5-S1 on the left. Intraoperative x-ray confirmed my level, and then I dissected out over the extra foraminal space lateral to the pars and superior to the facet and sacral ala. I used a combination of the high-speed drill and the Kerrison punches to remove the lateral part of the pars and superior part of the facet at L5-S1 on the left. The underlying yellow ligament was opened and removed in a piecemeal fashion to  expose the underlying exiting L5 nerve root. I undercut the lateral recess and dissected down until I was medial to and distal to the L5 pedicle. The nerve root was well decompressed. We then gently retracted the nerve root with a retractor, coagulated the epidural venous vasculature, and incised the disc space. I performed a thorough intradiscal discectomy with pituitary rongeurs and curettes, until I had a nice decompression of the nerve root. I then palpated with a coronary dilator along the nerve root and into the foramen to assure adequate decompression. I felt no more compression of the nerve root. I irrigated with saline solution containing bacitracin. Achieved hemostasis with bipolar cautery, lined the dura with Gelfoam, and then closed the fascia with 0 Vicryl. I closed the subcutaneous tissues with 2-0 Vicryl and the subcuticular tissues with 3-0 Vicryl. The skin was then closed with benzoin and Steri-Strips. The drapes were removed, a sterile dressing was applied. The patient was awakened from general anesthesia and transferred to the recovery room in stable condition. At the end of the procedure all sponge, needle and instrument counts were correct.   PLAN OF CARE: Admit for overnight observation  PATIENT DISPOSITION:  PACU - hemodynamically stable.   Delay start of Pharmacological VTE agent (>24hrs) due to surgical blood loss or risk of bleeding:  yes

## 2013-01-24 NOTE — Anesthesia Preprocedure Evaluation (Signed)
Anesthesia Evaluation  Patient identified by MRN, date of birth, ID band Patient awake    Reviewed: Allergy & Precautions, H&P , NPO status , Patient's Chart, lab work & pertinent test results  Airway Mallampati: II  Neck ROM: full    Dental   Pulmonary neg pulmonary ROS,          Cardiovascular negative cardio ROS      Neuro/Psych  Headaches, Anxiety Depression Multiple Sclerosis  Neuromuscular disease    GI/Hepatic GERD-  ,  Endo/Other    Renal/GU      Musculoskeletal  (+) Arthritis -, Fibromyalgia -  Abdominal   Peds  Hematology   Anesthesia Other Findings   Reproductive/Obstetrics                           Anesthesia Physical Anesthesia Plan  ASA: II  Anesthesia Plan: General   Post-op Pain Management:    Induction: Intravenous  Airway Management Planned: Oral ETT  Additional Equipment:   Intra-op Plan:   Post-operative Plan: Extubation in OR  Informed Consent: I have reviewed the patients History and Physical, chart, labs and discussed the procedure including the risks, benefits and alternatives for the proposed anesthesia with the patient or authorized representative who has indicated his/her understanding and acceptance.     Plan Discussed with: CRNA, Anesthesiologist and Surgeon  Anesthesia Plan Comments:         Anesthesia Quick Evaluation

## 2013-01-25 MED ORDER — HYDROCODONE-ACETAMINOPHEN 5-325 MG PO TABS: 1.0000 | ORAL_TABLET | ORAL | Status: DC | PRN

## 2013-01-25 NOTE — Evaluation (Signed)
Physical Therapy Evaluation Patient Details Name: Katrina Rivera MRN: [id number] DOB: 10-28-1945 Today's Date: 01/25/2013 Time: 4098-1191 PT Time Calculation (min): 12 min  PT Assessment / Plan / Recommendation History of Present Illness  The patient was admitted on 01/24/2013 and taken to the operating room where the patient underwent L L5-S1 extraforaminal microdiskectomy.   Clinical Impression  Pt is mod I with all mobility and does not need further acute PT services at this time.  Will benefit from outpatient PT for abdominal and back strengthening.    PT Assessment  All further PT needs can be met in the next venue of care    Follow Up Recommendations  Outpatient PT    Does the patient have the potential to tolerate intense rehabilitation      Barriers to Discharge        Equipment Recommendations  None recommended by PT    Recommendations for Other Services     Frequency      Precautions / Restrictions Precautions Precautions: Back Restrictions Weight Bearing Restrictions: No   Pertinent Vitals/Pain No c/o pain, pt reports she had meds prior to treatment      Mobility  Bed Mobility Bed Mobility: Supine to Sit;Sit to Supine Supine to Sit: 6: Modified independent (Device/Increase time) Sit to Supine: 6: Modified independent (Device/Increase time) Details for Bed Mobility Assistance: cues for log roll Transfers Transfers: Sit to Stand;Stand to Sit Sit to Stand: 6: Modified independent (Device/Increase time) Stand to Sit: 6: Modified independent (Device/Increase time) Details for Transfer Assistance: with RW Ambulation/Gait Ambulation/Gait Assistance: 6: Modified independent (Device/Increase time) Ambulation Distance (Feet): 200 Feet Assistive device: Rolling walker Ambulation/Gait Assistance Details: Pt also able to gait without AD with mod I, decreased cadence, no LOB Stairs: Yes Stairs Assistance: 6: Modified independent (Device/Increase time) Stair  Management Technique: One rail Right Number of Stairs: 10    Exercises     PT Diagnosis: Generalized weakness  PT Problem List: Decreased strength;Pain PT Treatment Interventions:       PT Goals(Current goals can be found in the care plan section) Acute Rehab PT Goals PT Goal Formulation: No goals set, d/c therapy  Visit Information  Last PT Received On: 01/25/13 Assistance Needed: +1 History of Present Illness: The patient was admitted on 01/24/2013 and taken to the operating room where the patient underwent L L5-S1 extraforaminal microdiskectomy.        Prior Functioning  Home Living Family/patient expects to be discharged to:: Private residence Living Arrangements: Spouse/significant other Available Help at Discharge: Family Type of Home: House Home Access: Stairs to enter Secretary/administrator of Steps: 1 Entrance Stairs-Rails: None Home Layout: One level Home Equipment: Environmental consultant - 2 wheels Prior Function Level of Independence: Independent Communication Communication: No difficulties    Cognition  Cognition Arousal/Alertness: Awake/alert Behavior During Therapy: WFL for tasks assessed/performed Overall Cognitive Status: Within Functional Limits for tasks assessed    Extremity/Trunk Assessment Upper Extremity Assessment Upper Extremity Assessment: Overall WFL for tasks assessed Lower Extremity Assessment Lower Extremity Assessment: Overall WFL for tasks assessed Cervical / Trunk Assessment Cervical / Trunk Assessment: Normal   Balance    End of Session PT - End of Session Activity Tolerance: Patient tolerated treatment well Patient left: in bed;with call bell/phone within reach Nurse Communication: Mobility status  GP     Orlando Thalmann 01/25/2013, 9:57 AM

## 2013-01-25 NOTE — Progress Notes (Signed)
Pt. discharged home accompanied by husband. Prescriptions and discharge instructions given with verbalization of understanding. Incision site on back with no s/s of infection - no swelling, redness, bleeding, and/or drainage noted.  Bruises noted on hip area from recovery incident prior to arrival to unit. RN informed patient and spouse to notified MD if complication from incident which MD is aware. Opportunity given to ask questions but no question asked. Pt. transported out of this unit in wheelchair by the volunteer.

## 2013-01-25 NOTE — Discharge Summary (Signed)
Physician Discharge Summary  Patient ID: Katrina Rivera MRN: [id number] DOB/AGE: [id number] y.o.  Admit date: 01/24/2013 Discharge date: 01/25/2013  Admission Diagnoses: L L5-S1 extraforaminal HNP   Discharge Diagnoses: same   Discharged Condition: good  Hospital Course: The patient was admitted on 01/24/2013 and taken to the operating room where the patient underwent L L5-S1 extraforaminal microdiskectomy. The patient tolerated the procedure well and was taken to the recovery room and then to the floor in stable condition. The hospital course was routine. There were no complications. The wound remained clean dry and intact. Pt had appropriate back soreness. No complaints of leg pain or new N/T/W. The patient remained afebrile with stable vital signs, and tolerated a regular diet. The patient continued to increase activities, and pain was well controlled with oral pain medications.   Consults: None  Significant Diagnostic Studies:  Results for orders placed during the hospital encounter of 01/21/13  SURGICAL PCR SCREEN      Result Value Range   MRSA, PCR NEGATIVE  NEGATIVE   Staphylococcus aureus NEGATIVE  NEGATIVE  BASIC METABOLIC PANEL      Result Value Range   Sodium 141  135 - 145 mEq/L   Potassium 3.8  3.5 - 5.1 mEq/L   Chloride 102  96 - 112 mEq/L   CO2 31  19 - 32 mEq/L   Glucose, Bld 87  70 - 99 mg/dL   BUN 10  6 - 23 mg/dL   Creatinine, Ser 4.09  0.50 - 1.10 mg/dL   Calcium 8.6  8.4 - 81.1 mg/dL   GFR calc non Af Amer 86 (*) >90 mL/min   GFR calc Af Amer >90  >90 mL/min  CBC WITH DIFFERENTIAL      Result Value Range   WBC 7.9  4.0 - 10.5 K/uL   RBC 3.75 (*) 3.87 - 5.11 MIL/uL   Hemoglobin 10.8 (*) 12.0 - 15.0 g/dL   HCT 91.4 (*) 78.2 - 95.6 %   MCV 88.5  78.0 - 100.0 fL   MCH 28.8  26.0 - 34.0 pg   MCHC 32.5  30.0 - 36.0 g/dL   RDW 21.3  08.6 - 57.8 %   Platelets 250  150 - 400 K/uL   Neutrophils Relative % 59  43 - 77 %   Neutro Abs 4.7  1.7 - 7.7  K/uL   Lymphocytes Relative 30  12 - 46 %   Lymphs Abs 2.4  0.7 - 4.0 K/uL   Monocytes Relative 9  3 - 12 %   Monocytes Absolute 0.7  0.1 - 1.0 K/uL   Eosinophils Relative 1  0 - 5 %   Eosinophils Absolute 0.1  0.0 - 0.7 K/uL   Basophils Relative 0  0 - 1 %   Basophils Absolute 0.0  0.0 - 0.1 K/uL  PROTIME-INR      Result Value Range   Prothrombin Time 11.9  11.6 - 15.2 seconds   INR 0.89  0.00 - 1.49    Chest 2 View  01/21/2013   CLINICAL DATA:  Lumbar laminectomy and decompression.  EXAM: CHEST  2 VIEW  COMPARISON:  None.  FINDINGS: The heart size and mediastinal contours are within normal limits. Both lungs are clear. The visualized skeletal structures are unremarkable.  IMPRESSION: No active cardiopulmonary disease.   Electronically Signed   By: Elige Ko   On: 01/21/2013 13:28   Dg Lumbar Spine 1 View  01/24/2013   CLINICAL DATA:  L5-S1  laminectomy  EXAM: LUMBAR SPINE - 1 VIEW  COMPARISON:  CT abdomen/ pelvis 02/13/2012  FINDINGS: A single cross-table lateral intraoperative spot image demonstrates soft tissue spreaders posterior to the L5-S1 level. A metallic probe is positioned at the L5-S1 disc space. There is degenerative disc disease at this level with evidence of disc space narrowing.  IMPRESSION: Intraoperative localization radiographs as above.   Electronically Signed   By: Malachy Moan M.D.   On: 01/24/2013 11:53    Antibiotics:  Anti-infectives   Start     Dose/Rate Route Frequency Ordered Stop   01/24/13 1600  ceFAZolin (ANCEF) IVPB 1 g/50 mL premix     1 g 100 mL/hr over 30 Minutes Intravenous Every 8 hours 01/24/13 1237 01/25/13 0001   01/24/13 0816  bacitracin 50,000 Units in sodium chloride irrigation 0.9 % 500 mL irrigation  Status:  Discontinued       As needed 01/24/13 0816 01/24/13 0918   01/24/13 0600  ceFAZolin (ANCEF) IVPB 2 g/50 mL premix     2 g 100 mL/hr over 30 Minutes Intravenous On call to O.R. 01/23/13 1441 01/24/13 0750      Discharge  Exam: Blood pressure 157/70, pulse 75, temperature 98.6 F (37 C), temperature source Oral, resp. rate 20, SpO2 93.00%. Neurologic: Grossly normal Incision CDI  Discharge Medications:     Medication List         bifidobacterium infantis capsule  Take 1 capsule by mouth daily.     BUTRANS 10 MCG/HR Ptwk patch  Generic drug:  buprenorphine  Place 10 mcg onto the skin once a week. On Thursday.     calcium-vitamin D 500-200 MG-UNIT per tablet  Commonly known as:  OSCAL WITH D  Take 1 tablet by mouth 2 (two) times daily.     clonazePAM 0.5 MG tablet  Commonly known as:  KLONOPIN  Take 0.5 mg by mouth at bedtime.     docusate sodium 100 MG capsule  Commonly known as:  COLACE  Take 100 mg by mouth daily.     escitalopram 10 MG tablet  Commonly known as:  LEXAPRO  Take 10 mg by mouth daily before breakfast.     Ferrous Sulfate Dried 200 (65 FE) MG Tabs  Take 65 mg by mouth daily.     fludrocortisone 0.1 MG tablet  Commonly known as:  FLORINEF  Take 0.1-0.2 mg by mouth 2 (two) times daily. One tablet in the morning and two tablets at night.     fluticasone 50 MCG/ACT nasal spray  Commonly known as:  FLONASE  Place 2 sprays into both nostrils at bedtime as needed for allergies or rhinitis.     gabapentin 400 MG capsule  Commonly known as:  NEURONTIN  Take 800 mg by mouth 3 (three) times daily.     HYDROcodone-acetaminophen 5-325 MG per tablet  Commonly known as:  NORCO/VICODIN  Take 1-2 tablets by mouth every 4 (four) hours as needed for moderate pain.     hydrocortisone 10 MG tablet  Commonly known as:  CORTEF  Take 10-20 mg by mouth 2 (two) times daily. Take two tablets at lunch and one at supper.     ibuprofen 200 MG tablet  Commonly known as:  ADVIL,MOTRIN  Take 400 mg by mouth 2 (two) times daily as needed (pain).     lansoprazole 30 MG capsule  Commonly known as:  PREVACID  Take 30 mg by mouth daily before breakfast.     multivitamin with minerals  Tabs  tablet  Take 1 tablet by mouth daily.     nitrofurantoin 100 MG capsule  Commonly known as:  MACRODANTIN  Take 100 mg by mouth at bedtime.     omega-3 acid ethyl esters 1 G capsule  Commonly known as:  LOVAZA  Take 1 g by mouth 2 (two) times daily.     Oxcarbazepine 300 MG tablet  Commonly known as:  TRILEPTAL  Take 300 mg by mouth 2 (two) times daily.     potassium chloride 10 MEQ tablet  Commonly known as:  K-DUR,KLOR-CON  Take 20-30 mEq by mouth 2 (two) times daily. Take 3 in the morning and two at bedtime.     PRESCRIPTION MEDICATION  Apply 1 application topically at bedtime. meloxicam 0.5/doxepin 3%/amantadine 3%/dextromethorphan 2%/Lidocaine 2%. Compounded prescription.  Apply at 8pm and repeat at 10pm if needed.     SYSTANE OP  Apply 2 drops to eye daily as needed (for dry eyes).     tamsulosin 0.4 MG Caps capsule  Commonly known as:  FLOMAX  Take 0.4 mg by mouth daily after breakfast.        Disposition: home   Final Dx: L L5-S1 diskectomy      Discharge Orders   Future Appointments Provider Department Dept Phone   04/23/2013 2:00 PM [facility] [phone]   04/25/2013 10:30 AM Reather Littler, MD  Primary Care Endocrinology [phone]   05/14/2013 1:30 PM Nilda Riggs, NP [facility] [phone]   Future Orders Complete By Expires   Call MD for:  difficulty breathing, headache or visual disturbances  As directed    Call MD for:  persistant nausea and vomiting  As directed    Call MD for:  redness, tenderness, or signs of infection (pain, swelling, redness, odor or green/yellow discharge around incision site)  As directed    Call MD for:  severe uncontrolled pain  As directed    Call MD for:  temperature >100.4  As directed    Diet - low sodium heart healthy  As directed    Discharge instructions  As directed    Comments:     No driving, no lifting more than 8 lbs, may shower, no strenuous  activity   Increase activity slowly  As directed    Remove dressing in 48 hours  As directed       Follow-up Information   Follow up with Bettye Sitton S, MD. Schedule an appointment as soon as possible for a visit in 3 weeks.   Specialty:  Neurosurgery   Contact information:   [address] [place] [address] [phone]        Signed: Tia Alert 01/25/2013, 7:52 AM

## 2013-01-30 ENCOUNTER — Encounter (HOSPITAL_COMMUNITY): Payer: Self-pay | Admitting: Neurological Surgery

## 2013-02-05 ENCOUNTER — Other Ambulatory Visit: Payer: Self-pay | Admitting: *Deleted

## 2013-02-05 MED ORDER — POTASSIUM CHLORIDE CRYS ER 10 MEQ PO TBCR: 20.0000 meq | EXTENDED_RELEASE_TABLET | Freq: Two times a day (BID) | ORAL | Status: DC

## 2013-02-08 ENCOUNTER — Other Ambulatory Visit: Payer: Self-pay | Admitting: *Deleted

## 2013-02-08 ENCOUNTER — Telehealth: Payer: Self-pay | Admitting: *Deleted

## 2013-02-08 NOTE — Telephone Encounter (Signed)
Pt called and lvm stating that her rx for Klor-Con was sent in incorrectly, She needs the one with 10 meq, wax matrix. Pt states she has trouble swallowing pills.

## 2013-02-08 NOTE — Telephone Encounter (Signed)
Opened encounter in error  

## 2013-02-12 ENCOUNTER — Ambulatory Visit: Payer: Medicare Other | Admitting: Neurology

## 2013-02-15 ENCOUNTER — Telehealth: Payer: Self-pay | Admitting: Neurology

## 2013-02-15 NOTE — Telephone Encounter (Signed)
Will Address her questions during followup visit

## 2013-02-15 NOTE — Telephone Encounter (Signed)
Called patient and schedule appt 02/19/13,and she is still requesting something for pain, barely walking, skin feels too tight, not sleeping.  She is currently taking oxycodone-10/325mg , hydrocodone-325 mg, prescribed after surgery, not touching pain. [facility], possibly flexeril.

## 2013-02-15 NOTE — Telephone Encounter (Signed)
Pt recently had sgy 01/24/13 and it has sent her into an MS attack and she needs some relief wants to be seen today. States she has been in bed  for a couple of days and has had to start using her walker again.

## 2013-02-18 ENCOUNTER — Telehealth: Payer: Self-pay | Admitting: Neurology

## 2013-02-18 ENCOUNTER — Ambulatory Visit: Payer: Self-pay | Admitting: Nurse Practitioner

## 2013-02-18 ENCOUNTER — Ambulatory Visit (INDEPENDENT_AMBULATORY_CARE_PROVIDER_SITE_OTHER): Payer: Medicare Other | Admitting: Neurology

## 2013-02-18 ENCOUNTER — Encounter: Payer: Self-pay | Admitting: Neurology

## 2013-02-18 VITALS — BP 160/80 | HR 75 | Temp 98.1°F | Ht 59.0 in | Wt 135.0 lb

## 2013-02-18 DIAGNOSIS — G35D Multiple sclerosis, unspecified: Secondary | ICD-10-CM

## 2013-02-18 DIAGNOSIS — G35 Multiple sclerosis: Secondary | ICD-10-CM

## 2013-02-18 DIAGNOSIS — R269 Unspecified abnormalities of gait and mobility: Secondary | ICD-10-CM

## 2013-02-18 DIAGNOSIS — Z9889 Other specified postprocedural states: Secondary | ICD-10-CM

## 2013-02-18 MED ORDER — BACLOFEN 10 MG PO TABS
10.0000 mg | ORAL_TABLET | Freq: Three times a day (TID) | ORAL | Status: DC
Start: 2013-02-18 — End: 2013-03-01

## 2013-02-18 MED ORDER — FENTANYL 50 MCG/HR TD PT72
50.0000 ug | MEDICATED_PATCH | TRANSDERMAL | Status: DC
Start: 2013-02-18 — End: 2013-03-01

## 2013-02-18 MED ORDER — BACLOFEN 10 MG PO TABS: 10.0000 mg | ORAL_TABLET | Freq: Three times a day (TID) | ORAL | Status: DC

## 2013-02-18 NOTE — Telephone Encounter (Signed)
Pharmacy called wanting to check the status of baclofen (LIORESAL) 10 MG tablet [[id number]][[id number]].  Per order it appears that the prescription was e-scribed to [facility] - [place], [place] - [address] instead of sending it directly to [facility].  [facility].  Can this be changed to that pharmacy?  Please call [facility].

## 2013-02-18 NOTE — Progress Notes (Signed)
Subjective:    Patient ID: Katrina Rivera is a 68 y.o. female.   [name] is a very pleasant 68 year old right-handed woman, accompanied by her husband, for followup of multiple sclerosis, neuropathic pain of bilateral lower extremities.  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   She was last seen by Dr. Frances FurbishAthar in July 2014, and which time he felt that she was doing well. 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   She has baseline gait difficulty,   she fell in May 2013,  fracture in both elbows and her left kneecap. She had postural hypotension and is on Florinef including prednisone. She intermittently has been using a cane or walker.  She could not tolerate Requip  because of nausea. She has been on gabapentin 300 mg  3 bid. She gained significant wt when on Lyrica and therefore was switched back to gabapentin. She has OH and is on midodrine 2.5 mg and fludrocortisone 0.1 mg 1 in AM and 2 at night, and she is also on 20 mg of hydrocortisone.   She complains of bilateral lower extremity burning stinging sensation, getting worse when she sits still, difficulty falling into sleep, she has the urge to move because of bilateral extremity discomfort, has tried Requip, could not tolerate it because of GI side effects, Neuprol patch does not work either, she also tried Elavil, Cymbalta in the past, could not tolerate it due to side effect.  Jan 01/2014:  She came in urgently for acute worsening of her generalized condition since her lumbar  decompression surgery in January 24 2013 by [name], prior to surgery, she suffered left-sided low back pain, radiating pain to her left lower extremity, left is much worse than her right side, there was left L5-S1 extraforaminal herniated nucleus pulposus with left L5 radiculopathy she had left L5-S1 extraforaminal microdiscectomy utilizing microscopic dissection.  She had overnight stay at the hospital the day after surgery, she fell when trying to get up using bathroom, landed on her left side, ever since surgery, her lower back pain, radiating pain to her left leg has much improved, but she complained of worsening bilateral lower extremity deep achy pain, worsening gait difficulty, she has not used walker for 3 years, now she began to use her walker, she felt her skin is so tight at bilateral lower extremity, left leg swelling, also has difficulty swallowing, blurred vision, worsening fatigue, difficulty concentration, she has not had MRI evaluation for her multiple sclerosis for many years, is not on any immunomodulation therapy     Past Medical History Is Significant For: Past Medical History  Diagnosis Date  . Fibromyalgia   . Restless leg syndrome   . Multiple sclerosis   . Arthritis   . Osteoporosis   . MS (multiple sclerosis) 08/15/2012  . Hypotension   . Depression   . Anxiety   . Anemia   . Headache(784.0)   . GERD (gastroesophageal reflux disease)     Her Past Surgical History Is Significant For: Past Surgical History  Procedure Laterality Date  . Appendectomy    . Colectomy    .  Cholecystectomy    . Cesarean section      x2  . Abdominal hysterectomy    . Fracture surgery      R leg, rod & screws   . Eye surgery      bilateral - /w IOL  . Lumbar laminectomy/decompression microdiscectomy Left 01/24/2013    Procedure: LUMBAR FIVE TO SACRAL ONE LUMBAR LAMINECTOMY/DECOMPRESSION MICRODISCECTOMY 1 LEVEL;  Surgeon: Tia Alert, MD;  Location: [facility];   Service: Neurosurgery;  Laterality: Left;    Her Family History Is Significant For: Family History  Problem Relation Age of Onset  . Hypertension Mother   . Stroke Mother   . Heart attack Father     Her Social History Is Significant For: History   Social History  . Marital Status: Married    Spouse Name: [name]    Number of Children: 2  . Years of Education: 12   Occupational History  .      Disabled   Social History Main Topics  . Smoking status: Never Smoker   . Smokeless tobacco: Never Used  . Alcohol Use: No  . Drug Use: No  . Sexual Activity: None   Other Topics Concern  . None   Social History Narrative   Pt lives at home with spouse. ([name])   Caffeine Use: 2-3 cups daily.   Right handed.   Disabled.   Education - high school    Her Allergies Are:  No Known Allergies:   Her Current Medications Are:  Outpatient Encounter Prescriptions as of 02/18/2013  Medication Sig  . bifidobacterium infantis (ALIGN) capsule Take 1 capsule by mouth daily.  . calcium-vitamin D (OSCAL WITH D) 500-200 MG-UNIT per tablet Take 1 tablet by mouth 2 (two) times daily.  . clonazePAM (KLONOPIN) 0.5 MG tablet Take 0.5 mg by mouth at bedtime.  . cyclobenzaprine (FLEXERIL) 10 MG tablet Take 10 mg by mouth at bedtime as needed and may repeat dose one time if needed for muscle spasms.  Marland Kitchen docusate sodium (COLACE) 100 MG capsule Take 100 mg by mouth daily.  Marland Kitchen escitalopram (LEXAPRO) 10 MG tablet Take 10 mg by mouth daily before breakfast.   . Ferrous Sulfate Dried 200 (65 FE) MG TABS Take 65 mg by mouth daily.  . fludrocortisone (FLORINEF) 0.1 MG tablet Take 0.1-0.2 mg by mouth 2 (two) times daily. One tablet in the morning and two tablets at night.  . fluticasone (FLONASE) 50 MCG/ACT nasal spray Place 2 sprays into both nostrils at bedtime as needed for allergies or rhinitis.  Marland Kitchen gabapentin (NEURONTIN) 400 MG capsule Take 800 mg by mouth 3 (three) times daily.  Marland Kitchen HYDROcodone-acetaminophen  (NORCO/VICODIN) 5-325 MG per tablet Take 1-2 tablets by mouth every 4 (four) hours as needed for moderate pain.  . hydrocortisone (CORTEF) 10 MG tablet Take 10-20 mg by mouth 2 (two) times daily. Take two tablets at lunch and one at supper.  Marland Kitchen ibuprofen (ADVIL,MOTRIN) 200 MG tablet Take 400 mg by mouth 2 (two) times daily as needed (pain).  Marland Kitchen lansoprazole (PREVACID) 30 MG capsule Take 30 mg by mouth daily before breakfast.   . Multiple Vitamin (MULITIVITAMIN WITH MINERALS) TABS Take 1 tablet by mouth daily.  . nitrofurantoin (MACRODANTIN) 100 MG capsule Take 100 mg by mouth at bedtime.  Marland Kitchen omega-3 acid ethyl esters (LOVAZA) 1 G capsule Take 1 g by mouth 2 (two) times daily.  . Oxcarbazepine (TRILEPTAL) 300 MG tablet Take 300 mg by mouth 2 (two) times daily.  Marland Kitchen  Polyethyl Glycol-Propyl Glycol (SYSTANE OP) Apply 2 drops to eye daily as needed (for dry eyes).  . potassium chloride (K-DUR,KLOR-CON) 10 MEQ tablet Take 2-3 tablets (20-30 mEq total) by mouth 2 (two) times daily. Take 3 in the morning and two at bedtime.  Marland Kitchen PRESCRIPTION MEDICATION Apply 1 application topically at bedtime. meloxicam 0.5/doxepin 3%/amantadine 3%/dextromethorphan 2%/Lidocaine 2%. Compounded prescription.  Apply at 8pm and repeat at 10pm if needed.  . tamsulosin (FLOMAX) 0.4 MG CAPS capsule Take 0.4 mg by mouth daily after breakfast.  . [DISCONTINUED] buprenorphine (BUTRANS) 10 MCG/HR PTWK patch Place 10 mcg onto the skin once a week. On Thursday.   Review of Systems  Constitutional: Positive for fatigue.  Endocrine: Positive for heat intolerance (feeling hot).  Genitourinary: Positive for difficulty urinating (urination problems).  Neurological: Positive for dizziness and tremors.  Psychiatric/Behavioral: Sleep disturbance: restless legs, insomnia, not enough sleep.    Objective:  Neurologic Exam  Physical Exam Physical Examination:   Filed Vitals:   02/18/13 1339  BP: 160/80  Pulse: 75  Temp: 98.1 F (36.7 C)    PHYSICAL EXAMINATOINS:  Generalized: In no acute distress  Neck: Supple, no carotid bruits   Cardiac: Regular rate rhythm  Pulmonary: Clear to auscultation bilaterally  Musculoskeletal: No deformity  Neurological examination  Mentation: tired looking middle age female, oriented to time, place, history taking, and causual conversation  Cranial nerve II-XII: Pupils were equal round reactive to light extraocular movements were full, visual field were full on confrontational test. facial sensation and strength were normal. hearing was intact to finger rubbing bilaterally. Uvula tongue midline.  head turning and shoulder shrug and were normal and symmetric.Tongue protrusion into cheek strength was normal.  Motor: She has modified Ashworth Scale 2 bilateral lower extremity spasticity, no significant weakness,  Sensory: Intact to fine touch, pinprick, preserved vibratory sensation, and proprioception at toes.  Coordination: Normal finger to nose, heel-to-shin bilaterally there was no truncal ataxia  Gait: wide based, mild circumerferential, unsteady gait,   Romberg signs: Negative  Deep tendon reflexes: Brachioradialis 2/2, biceps 2/2, triceps 2/2, patellar 3/3,  Achilles 2/2, plantar responses were flexor bilaterally.  Assessment and Plan:   Assessment and Plan:  In summary, ALDYN CHAUDRY is a very pleasant 68 y.o.-year old female with a history of MS, stable, chronic pain, fairly well controlled.  1. Chamara's multiple sclerosis symptoms has been fairly stable over the past 10 years, but acute worsening since her most recent lumbar decompression surgery, I will proceed with MRI of brain, cervical, lumbar spine with without contrast to see if there is any new structure lesions.     2. she complains bilateral lower extremity deep achy pain, continue gabapentin to 800 mg 3 times a day,  Trileptal 150 twice a day, 3. Add on baclofen 10mg  tid for bilateral lower extremity  spasticity 4 continue physical therapy.   5. [place] of left leg to rule out DVT 5. RTC in 2-3 weeks

## 2013-02-18 NOTE — Telephone Encounter (Signed)
HAS APPT SCHEDULED FOR TOMORROW--HAVING MS ATTACK--NEEDS TO BE SEEN TODAY-PLEASE CALL

## 2013-02-18 NOTE — Telephone Encounter (Signed)
Spoke with patient and scheduled for today/confirmed

## 2013-02-18 NOTE — Telephone Encounter (Signed)
Rx has been resent to [facility]  

## 2013-02-19 ENCOUNTER — Ambulatory Visit: Payer: Self-pay | Admitting: Neurology

## 2013-02-22 ENCOUNTER — Ambulatory Visit (HOSPITAL_COMMUNITY): Payer: Medicare Other | Attending: Neurology

## 2013-02-22 ENCOUNTER — Telehealth: Payer: Self-pay | Admitting: *Deleted

## 2013-02-22 DIAGNOSIS — M79609 Pain in unspecified limb: Secondary | ICD-10-CM | POA: Insufficient documentation

## 2013-02-22 DIAGNOSIS — G35 Multiple sclerosis: Secondary | ICD-10-CM

## 2013-02-22 DIAGNOSIS — Z9889 Other specified postprocedural states: Secondary | ICD-10-CM

## 2013-02-22 DIAGNOSIS — R269 Unspecified abnormalities of gait and mobility: Secondary | ICD-10-CM

## 2013-02-22 NOTE — Telephone Encounter (Signed)
Please advise 

## 2013-02-22 NOTE — Telephone Encounter (Signed)
Message copied by Levert FeinsteinYAN, Johnwesley Lederman on Fri Feb 22, 2013  3:10 PM ------      Message from: Greggory StallionLEBEAU, MATTHEW A      Created: Fri Feb 22, 2013 10:32 AM      Regarding: Prelim       Patient is negative for acute DVT.            Incidental finding of avascular mass in posterior popliteal space. ------

## 2013-02-22 NOTE — Telephone Encounter (Signed)
Dana  Please see message below, would you help me with formal report. Thanks.

## 2013-02-22 NOTE — Telephone Encounter (Signed)
Katrina Rivera:  I called, failed to reach patient, please let her know, I have just recently started on baclofen, do not feel comfortable to give her too much new medication at one time, I will see her again in January 28, we will adjust her medications then

## 2013-02-25 NOTE — Telephone Encounter (Signed)
Called patient and told her to keep her follow up appt. For 03-06-2013 with Dr.Yan. Patient understood.

## 2013-02-27 ENCOUNTER — Ambulatory Visit
Admission: RE | Admit: 2013-02-27 | Discharge: 2013-02-27 | Disposition: A | Discharge status: Home / Self Care | Payer: Medicare Other | Source: Ambulatory Visit | Attending: Neurology | Admitting: Neurology

## 2013-02-27 DIAGNOSIS — G35 Multiple sclerosis: Secondary | ICD-10-CM

## 2013-02-27 DIAGNOSIS — R269 Unspecified abnormalities of gait and mobility: Secondary | ICD-10-CM

## 2013-02-27 DIAGNOSIS — Z9889 Other specified postprocedural states: Secondary | ICD-10-CM

## 2013-02-27 MED ORDER — GADOBENATE DIMEGLUMINE 529 MG/ML IV SOLN
12.0000 mL | Freq: Once | INTRAVENOUS | Status: AC | PRN
  Administered 2013-02-27: 12 mL via INTRAVENOUS

## 2013-03-01 ENCOUNTER — Encounter: Payer: Self-pay | Admitting: Neurology

## 2013-03-01 ENCOUNTER — Encounter (HOSPITAL_COMMUNITY): Payer: Medicare Other

## 2013-03-01 ENCOUNTER — Ambulatory Visit (INDEPENDENT_AMBULATORY_CARE_PROVIDER_SITE_OTHER): Payer: Medicare Other | Admitting: Neurology

## 2013-03-01 VITALS — BP 155/78 | HR 88 | Ht 59.0 in | Wt 139.0 lb

## 2013-03-01 DIAGNOSIS — R269 Unspecified abnormalities of gait and mobility: Secondary | ICD-10-CM

## 2013-03-01 DIAGNOSIS — G35 Multiple sclerosis: Secondary | ICD-10-CM

## 2013-03-01 MED ORDER — FENTANYL 50 MCG/HR TD PT72: 50.0000 ug | MEDICATED_PATCH | TRANSDERMAL | Status: DC

## 2013-03-01 MED ORDER — DULOXETINE HCL 60 MG PO CPEP
60.0000 mg | ORAL_CAPSULE | Freq: Every day | ORAL | Status: DC
Start: 2013-03-01 — End: 2013-04-05

## 2013-03-01 MED ORDER — FENTANYL 75 MCG/HR TD PT72: 75.0000 ug | MEDICATED_PATCH | TRANSDERMAL | Status: DC

## 2013-03-01 NOTE — Progress Notes (Signed)
Subjective:    Patient ID: Katrina Rivera is a 68 y.o. female.   Katrina Rivera is a very pleasant 68 year old right-handed woman, accompanied by her husband, for followup of multiple sclerosis, neuropathic pain of bilateral lower extremities.  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   She was last seen by Dr. Frances Furbish in July 2014, at which time she felt that she was doing well. 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   She has baseline gait difficulty,   she fell in May 2013,  fracture in both elbows and her left kneecap. She had postural hypotension and is on Florinef including prednisone. She intermittently has been using a cane or walker.  She could not tolerate Requip  because of nausea. She has been on gabapentin 300 mg  3 bid. She gained significant when on Lyrica, and therefore was switched back to gabapentin. She has orthostatic hypotension, and is on midodrine 2.5 mg and fludrocortisone 0.1 mg 1 in AM and 2 at night, and she is also on 20 mg of hydrocortisone.   She complains of bilateral lower extremity burning stinging sensation, getting worse when she sits still, difficulty falling into sleep, she has the urge to move because of bilateral extremity discomfort, has tried Requip, could not tolerate it because of GI side effects, Neuprol patch does not work either, she also tried Elavil, Cymbalta in the past, could not tolerate it due to side effect.  Jan 01/2014:  She came in urgently for acute worsening of her generalized condition since  her lumbar decompression surgery in January 24 2013 by Dr. Marikay Alar, prior to surgery, she suffered left-sided low back pain, radiating pain to her left lower extremity, left is much worse than her right side, there was left L5-S1 extraforaminal herniated nucleus pulposus with left L5 radiculopathy she had left L5-S1 extraforaminal microdiscectomy utilizing microscopic dissection.  She had overnight stay at the hospital the day after surgery, she fell when trying to get up using bathroom, landed on her left side, ever since surgery, her lower back pain, radiating pain to her left leg has much improved, but she complained of worsening bilateral lower extremity deep achy pain, worsening gait difficulty, she has not used walker for 3 years, now she began to use her walker, she felt her skin is so tight at bilateral lower extremity, left leg swelling, also has difficulty swallowing, blurred vision, worsening fatigue, difficulty concentration, she has not had MRI evaluation for her multiple sclerosis for many years, is not on any immunomodulation therapy   UPDATE Mar 01, 2013: She came in with a list of complains, much worse compared to presurgical level. She complains of blurred vision, difficulty sleeping, difficulty concentrating, dizziness, worsening gait difficulty, The most bothersome symptoms are bilateral lower extremity pain, constant, knife cutting pain, left worse than right she has been dealing with her bilateral lower extremity pain for many years, much worse recently,   Left leg showed no DVT on doppler study. She had 15 LB weight gain over one month, more difficulty sleepy, body shaking, more difficulty sleepy.  She has tried  fentanyl patch 50 mcg, Trileptal, Neurontin without helping  her symptoms .She is going to be seen by her surgeon Dr. Yetta Barre in 3 days   We have revealed MRI of the brain cervical lumbar spine together done at [facility] in January 2015  At L5-S1: disc bulging  and facet hypertrophy, status post microdiscectomy on the left, with expected post-surgical changes. Multi-level facet hypertrophy, with no spinal stenosis or foraminal narrowing  Possible small chronic demyelinating plaque at C6 on the right side. No abnormal enhancing lesions  Multiple supratentorial and 1 infratentorial chronic demyelinating plaques. No acute plaques. Mild cerebellar tonsillar ectopia   We also reviewed, and compared to her previous MRI in 2010 from [facility], there was no significant change in the brain lesions,     Past Medical History Is Significant For: Past Medical History  Diagnosis Date  . Fibromyalgia   . Restless leg syndrome   . Multiple sclerosis   . Arthritis   . Osteoporosis   . MS (multiple sclerosis) 08/15/2012  . Hypotension   . Depression   . Anxiety   . Anemia   . Headache(784.0)   . GERD (gastroesophageal reflux disease)     Her Past Surgical History Is Significant For: Past Surgical History  Procedure Laterality Date  . Appendectomy    . Colectomy    . Cholecystectomy    . Cesarean section      x2  . Abdominal hysterectomy    . Fracture surgery      R leg, rod & screws   . Eye surgery      bilateral - /w IOL  . Lumbar laminectomy/decompression microdiscectomy Left 01/24/2013    Procedure: LUMBAR FIVE TO SACRAL ONE LUMBAR LAMINECTOMY/DECOMPRESSION MICRODISCECTOMY 1 LEVEL;  Surgeon: Tia Alert, MD;  Location: [facility];  Service: Neurosurgery;  Laterality: Left;    Her Family History Is Significant For: Family History  Problem Relation Age of Onset  . Hypertension Mother   . Stroke Mother   . Heart attack Father     Her Social History Is Significant For: History   Social History  . Marital Status: Married    Spouse Name: [name]    Number of Children: 2  . Years of Education: 12   Occupational History  .      Disabled   Social History Main Topics  . Smoking status: Never Smoker   . Smokeless tobacco: Never  Used  . Alcohol Use: No  . Drug Use: No  . Sexual Activity: None   Other Topics Concern  . None   Social History Narrative   Pt lives at home with spouse. ([name])   Caffeine Use: 2-3 cups daily.   Right handed.   Disabled.   Education - high school    Her Allergies Are:  No Known Allergies:   Her Current Medications Are:  Outpatient Encounter Prescriptions as of 03/01/2013  Medication Sig  . baclofen (LIORESAL) 10 MG tablet Take 1 tablet (10 mg total) by mouth 3 (three) times daily.  . bifidobacterium infantis (ALIGN) capsule Take 1 capsule by mouth daily.  Marland Kitchen BUTRANS 10 MCG/HR PTWK patch   . calcium-vitamin D (OSCAL WITH D) 500-200 MG-UNIT per tablet Take 1 tablet by mouth 2 (two) times daily.  . clonazePAM (KLONOPIN) 0.5 MG tablet Take 0.5 mg by mouth at bedtime.  . docusate sodium (COLACE) 100 MG capsule Take 100 mg by mouth daily.  Marland Kitchen escitalopram (LEXAPRO) 10 MG tablet Take 10 mg  by mouth daily before breakfast.   . fentaNYL (DURAGESIC - DOSED MCG/HR) 50 MCG/HR Place 1 patch (50 mcg total) onto the skin every 3 (three) days.  . Ferrous Sulfate Dried 200 (65 FE) MG TABS Take 65 mg by mouth daily.  . fludrocortisone (FLORINEF) 0.1 MG tablet Take 0.1-0.2 mg by mouth 2 (two) times daily. One tablet in the morning and two tablets at night.  . fluticasone (FLONASE) 50 MCG/ACT nasal spray Place 2 sprays into both nostrils at bedtime as needed for allergies or rhinitis.  Marland Kitchen. gabapentin (NEURONTIN) 400 MG capsule Take 800 mg by mouth 3 (three) times daily.  Marland Kitchen. HYDROcodone-acetaminophen (NORCO/VICODIN) 5-325 MG per tablet Take 1-2 tablets by mouth every 4 (four) hours as needed for moderate pain.  . hydrocortisone (CORTEF) 10 MG tablet Take 10-20 mg by mouth 2 (two) times daily. Take two tablets at lunch and one at supper.  Marland Kitchen. ibuprofen (ADVIL,MOTRIN) 200 MG tablet Take 400 mg by mouth 2 (two) times daily as needed (pain).  Marland Kitchen. lansoprazole (PREVACID) 30 MG capsule Take 30 mg by mouth daily  before breakfast.   . Multiple Vitamin (MULITIVITAMIN WITH MINERALS) TABS Take 1 tablet by mouth daily.  . nitrofurantoin (MACRODANTIN) 100 MG capsule Take 100 mg by mouth at bedtime.  Marland Kitchen. omega-3 acid ethyl esters (LOVAZA) 1 G capsule Take 1 g by mouth 2 (two) times daily.  . Oxcarbazepine (TRILEPTAL) 300 MG tablet Take 300 mg by mouth 2 (two) times daily.  Bertram Gala. Polyethyl Glycol-Propyl Glycol (SYSTANE OP) Apply 2 drops to eye daily as needed (for dry eyes).  . potassium chloride (K-DUR,KLOR-CON) 10 MEQ tablet Take 2-3 tablets (20-30 mEq total) by mouth 2 (two) times daily. Take 3 in the morning and two at bedtime.  Marland Kitchen. PRESCRIPTION MEDICATION Apply 1 application topically at bedtime. meloxicam 0.5/doxepin 3%/amantadine 3%/dextromethorphan 2%/Lidocaine 2%. Compounded prescription.  Apply at 8pm and repeat at 10pm if needed.  . tamsulosin (FLOMAX) 0.4 MG CAPS capsule Take 0.4 mg by mouth daily after breakfast.  . [DISCONTINUED] cyclobenzaprine (FLEXERIL) 10 MG tablet Take 10 mg by mouth at bedtime as needed and may repeat dose one time if needed for muscle spasms.   Review of Systems  Constitutional: Positive for fatigue.  Endocrine: Positive for heat intolerance (feeling hot).  Genitourinary: Positive for difficulty urinating (urination problems).  Neurological: Positive for dizziness and tremors.  Psychiatric/Behavioral: Sleep disturbance: restless legs, insomnia, not enough sleep.    Objective:  Neurologic Exam  Physical Exam Physical Examination:   Filed Vitals:   03/01/13 1301  BP: 155/78  Pulse: 88   PHYSICAL EXAMINATOINS:  Generalized: In no acute distress  Neck: Supple, no carotid bruits   Cardiac: Regular rate rhythm  Pulmonary: Clear to auscultation bilaterally  Musculoskeletal: No deformity  Neurological examination  Mentation: tired looking middle age female, oriented to time, place, history taking, and causual conversation  Cranial nerve II-XII: Pupils were equal  round reactive to light extraocular movements were full, visual field were full on confrontational test. facial sensation and strength were normal. hearing was intact to finger rubbing bilaterally. Uvula tongue midline.  head turning and shoulder shrug and were normal and symmetric.Tongue protrusion into cheek strength was normal.  Motor: She has modified Ashworth Scale 2 bilateral lower extremity spasticity, she has mild left hip flexion, knee flexion, left ankle dorsi flexion weakness  Sensory: Intact to fine touch, pinprick, preserved vibratory sensation, and proprioception at toes.  Coordination: Normal finger to nose, heel-to-shin bilaterally there was no truncal  ataxia  Gait: wide based, mild circumerferential, dragging her left leg,  unsteady gait,   Romberg signs: Negative  Deep tendon reflexes: Brachioradialis 2/2, biceps 2/2, triceps 2/2, patellar 3/3,  Achilles 2/2, plantar responses were flexor bilaterally.  Assessment and Plan:   Assessment and Plan:  In summary, Katrina Rivera is a very pleasant 68 y.o.-year old female with a history of MS, stable, chronic pain, fairly well controlled.  1. Katrina Rivera's multiple sclerosis symptoms has been fairly stable over the past 10 years, but acute worsening since her most recent lumbar decompression surgery, I will proceed with MRI of brain, cervical, lumbar spine with without contrast to see if there is any new structure lesions.     2. she complains bilateral lower extremity deep achy pain, continue gabapentin to 800 mg 3 times a day,  Trileptal 150 twice a day, 3. increase fentanyl patch to 75 mcg every 3 days 4 refer her to pain management 5 moderate exercise 6 return to clinic with Katrina Rivera in 6 months.    60 minutes spent in coordinating her care, more than 50% spent in face-to-face consultation

## 2013-03-06 ENCOUNTER — Ambulatory Visit: Payer: Medicare Other | Admitting: Neurology

## 2013-03-12 ENCOUNTER — Other Ambulatory Visit: Payer: Self-pay | Admitting: Neurology

## 2013-03-12 MED ORDER — FENTANYL 75 MCG/HR TD PT72: 75.0000 ug | MEDICATED_PATCH | TRANSDERMAL | Status: DC

## 2013-03-12 NOTE — Telephone Encounter (Signed)
By viewing the last OV note, the patient is to stay on Fentanyl 75mcg.  A Rx was written on the day of her appt.  I called the patient back.  She said she cannot locate the Rx.  I advised her because of the type of drug this is, it cannot be called into the pharmacy, a new Rx must be written each time, and will need to be picked up.  Patient verbalized understanding.  She would like a new Rx written since she is unable to locate the one from 01/23.  Told her I will get a message to the provider regarding this, and if approved, we will call her when the Rx is ready for pick up.  I called the pharmacy.  Spoke with Olegario MessierKathy.  She said they have not filled the 75mcg and do not have it on file.  Okay to rewrite Rx?

## 2013-03-12 NOTE — Telephone Encounter (Signed)
Pt called in stating that Dr. Terrace Arabia put her on the Fentanyl 75 mcg/hr patch while in the hospital.  She wants to know if Dr. Terrace Arabia wants her to stay on this.  If so she will need a refill sent to [facility].  Please call to let know when it has been called in. Thank you

## 2013-03-12 NOTE — Telephone Encounter (Signed)
I called the patient back.  Now she said she did get the Rx from [facility].  She was confused.  The Fentanyl was written for #5, which would not last an entire month, only 15 days.  She says she will apply her last patch tomorrow, which will last 3 days, then she will be out.  She says this dose is working, and would like to continue on it.  She would like the new Rx to be written for #10, which would last 30 days so she would only have to make one trip here monthly, and as well would only pay 1 co-pay per month.  She asked me to call in the Rx, and I reminded her we could not call it in.  Today is the 12th day since the original Rx was written.  Sending request for refill to provider.  Patient is aware we will call her when Rx is ready for pick up.

## 2013-03-12 NOTE — Telephone Encounter (Signed)
Katrina Rivera from [facility] returned call.  Prescription was for fentanyl .  It was placed on 03-01-13 and picked up also on this date.  FYI

## 2013-03-13 NOTE — Telephone Encounter (Signed)
Called patient and left message informing her that her Rx was ready to be picked up at the front desk and if she has any other problems, questions or concerns to call the office. °

## 2013-03-26 ENCOUNTER — Ambulatory Visit: Payer: Self-pay | Admitting: Neurology

## 2013-04-02 ENCOUNTER — Telehealth: Payer: Self-pay | Admitting: Neurology

## 2013-04-02 MED ORDER — FENTANYL 50 MCG/HR TD PT72: 50.0000 ug | MEDICATED_PATCH | TRANSDERMAL | Status: DC

## 2013-04-02 NOTE — Telephone Encounter (Signed)
I called the patient back to clarify.  She said the Fentanyl dose is too much for her, and she would like to know if Dr Terrace Arabia will decrease her dose to instead.  Please advise.  Thank you.

## 2013-04-02 NOTE — Telephone Encounter (Signed)
I calle dthe patient to advise Dr Terrace Arabia has changed the Rx.  She is aware we call her when it is signed and ready for pick up.

## 2013-04-02 NOTE — Telephone Encounter (Signed)
Jessica: Please notify patient, I have wrote her fentanyl patch 50 mcg every 3 days.

## 2013-04-03 ENCOUNTER — Other Ambulatory Visit: Payer: Self-pay | Admitting: Neurology

## 2013-04-03 NOTE — Telephone Encounter (Signed)
Patient's husband picked up patient's Rx. I advised the husband that if the patient has any other problems, questions or concerns to call the office. Patient's husband verbalized understanding.

## 2013-04-05 ENCOUNTER — Telehealth: Payer: Self-pay | Admitting: Neurology

## 2013-04-05 MED ORDER — DULOXETINE HCL 60 MG PO CPEP: 60.0000 mg | ORAL_CAPSULE | Freq: Every day | ORAL | Status: DC

## 2013-04-05 NOTE — Telephone Encounter (Signed)
Pt called wants Dr. Terrace Arabia to call in her DULoxetine (CYMBALTA) 60 MG capsule through the [facility] mail order [phone] and wants a 90 day supply.

## 2013-04-05 NOTE — Telephone Encounter (Signed)
Rx has been sent  

## 2013-04-15 ENCOUNTER — Telehealth: Payer: Self-pay | Admitting: Neurology

## 2013-04-15 NOTE — Telephone Encounter (Signed)
Patient calling to state that she would like to discuss changing her Gabapentin and Cymbalta dosage because she believes it is causing her to gain weight. Please call patient and advise.

## 2013-04-15 NOTE — Telephone Encounter (Signed)
Patient called saying she feels Gabapentin and Cymbalta have caused weight gain.  She would like to know if the dose on these meds needs to be adjusted.  Please advise.  Thank you.

## 2013-04-15 NOTE — Telephone Encounter (Signed)
I have called Katrina Rivera, She is taking neurontin 300mg  bid, cymbalta 60mg  qday, concerning weight gain.  I have advised her to taper off neurontin 300mg  0/1 xone week, then stop.

## 2013-04-23 ENCOUNTER — Other Ambulatory Visit: Payer: Self-pay | Admitting: Endocrinology

## 2013-04-23 ENCOUNTER — Other Ambulatory Visit: Payer: Medicare Other

## 2013-04-23 LAB — CBC WITH DIFFERENTIAL/PLATELET
Basophils Absolute: 0 10*3/uL (ref 0.0–0.1)
Basophils Relative: 0 % (ref 0–1)
EOS PCT: 0 % (ref 0–5)
Eosinophils Absolute: 0 10*3/uL (ref 0.0–0.7)
HCT: 34.8 % — ABNORMAL LOW (ref 36.0–46.0)
HEMOGLOBIN: 11.6 g/dL — AB (ref 12.0–15.0)
LYMPHS ABS: 1.2 10*3/uL (ref 0.7–4.0)
Lymphocytes Relative: 13 % (ref 12–46)
MCH: 28.5 pg (ref 26.0–34.0)
MCHC: 33.3 g/dL (ref 30.0–36.0)
MCV: 85.5 fL (ref 78.0–100.0)
Monocytes Absolute: 0.5 10*3/uL (ref 0.1–1.0)
Monocytes Relative: 6 % (ref 3–12)
Neutro Abs: 7.4 10*3/uL (ref 1.7–7.7)
Neutrophils Relative %: 81 % — ABNORMAL HIGH (ref 43–77)
PLATELETS: 274 10*3/uL (ref 150–400)
RBC: 4.07 MIL/uL (ref 3.87–5.11)
RDW: 15.3 % (ref 11.5–15.5)
WBC: 9.1 10*3/uL (ref 4.0–10.5)

## 2013-04-23 LAB — BASIC METABOLIC PANEL
BUN: 11 mg/dL (ref 6–23)
CALCIUM: 9 mg/dL (ref 8.4–10.5)
CO2: 31 meq/L (ref 19–32)
CREATININE: 0.83 mg/dL (ref 0.50–1.10)
Chloride: 102 mEq/L (ref 96–112)
Glucose, Bld: 136 mg/dL — ABNORMAL HIGH (ref 70–99)
Potassium: 4.4 mEq/L (ref 3.5–5.3)
Sodium: 140 mEq/L (ref 135–145)

## 2013-04-24 LAB — T4, FREE: Free T4: 1.13 ng/dL (ref 0.80–1.80)

## 2013-04-25 ENCOUNTER — Ambulatory Visit (INDEPENDENT_AMBULATORY_CARE_PROVIDER_SITE_OTHER): Payer: Medicare Other | Admitting: Endocrinology

## 2013-04-25 VITALS — BP 160/80 | HR 91 | Temp 98.1°F | Resp 14 | Ht 63.0 in | Wt 142.6 lb

## 2013-04-25 DIAGNOSIS — I951 Orthostatic hypotension: Secondary | ICD-10-CM

## 2013-04-25 DIAGNOSIS — G903 Multi-system degeneration of the autonomic nervous system: Secondary | ICD-10-CM

## 2013-04-25 DIAGNOSIS — G238 Other specified degenerative diseases of basal ganglia: Secondary | ICD-10-CM

## 2013-04-25 DIAGNOSIS — E2740 Unspecified adrenocortical insufficiency: Secondary | ICD-10-CM

## 2013-04-25 DIAGNOSIS — R635 Abnormal weight gain: Secondary | ICD-10-CM

## 2013-04-25 DIAGNOSIS — E2749 Other adrenocortical insufficiency: Secondary | ICD-10-CM

## 2013-04-25 NOTE — Patient Instructions (Addendum)
Florinef 1 am and 1 in pm  Hydrocortisone 1 1/2 at breakfast and 1 at dinner  Check BP 2x per week

## 2013-04-25 NOTE — Progress Notes (Signed)
Patient ID: Katrina Rivera, female   DOB: 27-Jan-1946, 68 y.o.   MRN: [id number]   Subjective:      Chief complaint: Followup of multiple problems  PAST history: She has had long-standing problems with orthostatic hypotension and also hyponatremia. Has been diagnosed with dysautonomia and has multiple problems related to autonomic neuropathy.  Also has been diagnosed with secondary adrenal insufficiency  She has been on Florinef since 2004 usually takingt 3 tablets daily along with 5 tablets of potassium which had previously controlled her symptoms well . She apparently does better with the regimen of one tablet in the morning and 2 in the evening. Blood pressure tends to get high if she increases Florinef to 4 tablets  Because of persistent orthostatic symptoms she had been given midodrine in  05/28/12.  She has had medication adjustments done frequently over the last year for balancing out her blood pressure.  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  Prior testing includes Cortrosyn test baseline with stimulated level of 15.7, baseline 3.9.  The response to therapy was Improved symptoms with better appetite, no further weight loss and less frequent nausea.At first did not tolerate hydrocortisone and was given prednisone. Was having abdominal distress with taking prednisone at breakfast and also had insomnia, stomach pain, nausea and is back on hydrocortisone   Taking the full replacement doses  of hydrocortisone now at lunch and supper and is tolerating this well.  She has gained weight since her last visit because of an increased appetite after her surgery which she thinks is from gabapentin. Also has not been very active  Wt Readings from Last 3 Encounters:  04/25/13 142 lb 9.6 oz (64.683 kg)  03/01/13 139 lb (63.05 kg)  02/18/13 135 lb (61.236 kg)    General Endocrinology:    Pituitary: She  did have a low free T4 previously but did not tolerate thyroxine supplementation and free T4 subsequently has been consistently normal without any treatment       Medication List       This list is accurate as of: 04/25/13 10:35 AM.  Always use your most recent med list.               bifidobacterium infantis capsule  Take 1 capsule by mouth daily.     calcium-vitamin D 500-200 MG-UNIT per tablet  Commonly known as:  OSCAL WITH D  Take 1 tablet by mouth 2 (two) times daily.     clonazePAM 0.5 MG tablet  Commonly known as:  KLONOPIN  Take 0.5 mg by mouth at bedtime.     docusate sodium 100 MG capsule  Commonly known as:  COLACE  Take 100 mg by mouth daily.     DULoxetine 60 MG capsule  Commonly known as:  CYMBALTA  Take 1 capsule (60 mg total) by mouth daily.     fentaNYL 50 MCG/HR  Commonly known as:  DURAGESIC - dosed mcg/hr  Place 1 patch (50 mcg total) onto the skin every  3 (three) days.     Ferrous Sulfate Dried 200 (65 FE) MG Tabs  Take 65 mg by mouth daily.     fludrocortisone 0.1 MG tablet  Commonly known as:  FLORINEF  Take 0.1-0.2 mg by mouth 2 (two) times daily. One tablet in the morning and two tablets at night.     fluticasone 50 MCG/ACT nasal spray  Commonly known as:  FLONASE  Place 2 sprays into both nostrils at bedtime as needed for allergies or rhinitis.     hydrocortisone 10 MG tablet  Commonly known as:  CORTEF  Take 10-20 mg by mouth 2 (two) times daily. Take two tablets at lunch and one at supper.     ibuprofen 200 MG tablet  Commonly  known as:  ADVIL,MOTRIN  Take 400 mg by mouth 2 (two) times daily as needed (pain).     lansoprazole 30 MG capsule  Commonly known as:  PREVACID  Take 30 mg by mouth daily before breakfast.     multivitamin with minerals Tabs tablet  Take 1 tablet by mouth daily.     omega-3 acid ethyl esters 1 G capsule  Commonly known as:  LOVAZA  Take 1 g by mouth 2 (two) times daily.     Oxcarbazepine 300 MG tablet  Commonly known as:  TRILEPTAL  Take 300 mg by mouth 2 (two) times daily.     potassium chloride 10 MEQ tablet  Commonly known as:  K-DUR,KLOR-CON  Take 2-3 tablets (20-30 mEq total) by mouth 2 (two) times daily. Take 3 in the morning and two at bedtime.     PRESCRIPTION MEDICATION  Apply 1 application topically at bedtime. meloxicam 0.5/doxepin 3%/amantadine 3%/dextromethorphan 2%/Lidocaine 2%. Compounded prescription.  Apply at 8pm and repeat at 10pm if needed.     SYSTANE OP  Apply 2 drops to eye daily as needed (for dry eyes).     tamsulosin 0.4 MG Caps capsule  Commonly known as:  FLOMAX  Take 0.4 mg by mouth daily after breakfast.        Review of Systems    Increased sweating:  she gets hot flashes and periodic sweating in day. She is not complaining of worsening episodes of marked sweating. Did not tolerate propantheline. Had been slightly better with Climara 0.1 mg and subsequently  taking Vivelle but only 0.05 mg twice a week; Stopped this because of cost. Effexor was also tried but she did not tolerate this and also sweating was no better She was started on clonidine patches on the last visit and do symptomatically better Currently she is not on HRT  Genitourinary: Positive for difficulty urinating.       She was taking urecholine for her  neurogenic bladder  but stopped this because of excessive salivation Neurological: Positive for numbness.       Has peripheral neuropathy, symptomatic of unclear etiology and followed by neurologist  She is again complaining  of significant amount of pain in her feet and legs and has not had relief with large doses of gabapentin. Has also tried Cymbalta and Lyrica. She  is not taking a compounded preparation given by her pain specialist including lidocaine  She has been anemic and was started on iron by PCP. Hemoglobin is better    Labs:  Orders Only on 04/23/2013  Component Date Value Ref Range Status  . WBC 04/23/2013 9.1  4.0 - 10.5 K/uL Final  . RBC 04/23/2013 4.07  3.87 - 5.11 MIL/uL Final  . Hemoglobin  04/23/2013 11.6* 12.0 - 15.0 g/dL Final  . HCT 04/23/2013 34.8* 36.0 - 46.0 % Final  . MCV 04/23/2013 85.5  78.0 - 100.0 fL Final  . MCH 04/23/2013 28.5  26.0 - 34.0 pg Final  . MCHC 04/23/2013 33.3  30.0 - 36.0 g/dL Final  . RDW 04/23/2013 15.3  11.5 - 15.5 % Final  . Platelets 04/23/2013 274  150 - 400 K/uL Final  . Neutrophils Relative % 04/23/2013 81* 43 - 77 % Final  . Neutro Abs 04/23/2013 7.4  1.7 - 7.7 K/uL Final  . Lymphocytes Relative 04/23/2013 13  12 - 46 % Final  . Lymphs Abs 04/23/2013 1.2  0.7 - 4.0 K/uL Final  . Monocytes Relative 04/23/2013 6  3 - 12 % Final  . Monocytes Absolute 04/23/2013 0.5  0.1 - 1.0 K/uL Final  . Eosinophils Relative 04/23/2013 0  0 - 5 % Final  . Eosinophils Absolute 04/23/2013 0.0  0.0 - 0.7 K/uL Final  . Basophils Relative 04/23/2013 0  0 - 1 % Final  . Basophils Absolute 04/23/2013 0.0  0.0 - 0.1 K/uL Final  . Smear Review 04/23/2013 Criteria for review not met   Final  . Sodium 04/23/2013 140  135 - 145 mEq/L Final  . Potassium 04/23/2013 4.4  3.5 - 5.3 mEq/L Final  . Chloride 04/23/2013 102  96 - 112 mEq/L Final  . CO2 04/23/2013 31  19 - 32 mEq/L Final  . Glucose, Bld 04/23/2013 136* 70 - 99 mg/dL Final  . BUN 04/23/2013 11  6 - 23 mg/dL Final  . Creat 04/23/2013 0.83  0.50 - 1.10 mg/dL Final  . Calcium 04/23/2013 9.0  8.4 - 10.5 mg/dL Final  . Free T4 04/23/2013 1.13  0.80 - 1.80 ng/dL Final       Objective:   Physical Exam  BP 160/80  Pulse  91  Temp(Src) 98.1 F (36.7 C)  Resp 14  Ht _0  (1.6 m)  Wt 142 lb 9.6 oz (64.683 kg)  BMI 25.27 kg/m2  SpO2 99%  Standing blood pressure 152/82 She has mild generalized obesity, however appears to have mild supraclavicular fat pads No thinning of her skin or bruising evident  No pedal edema  Assessment:      Orthostatic dysautonomic hypotension syndrome:   Her symptoms are controlled but she is having is significantly higher blood pressure today This is probably related to her significant weight gain since her last visit Electrolytes are normal  Weight gain: This has occurred after her surgery possibly from her new medications and inactivity as well as increased appetite  Adrenal insufficiency: She has no symptoms at this time Since she has had weight gain and subtle features of central fat deposition will try to reduce her hydrocortisone by at least 5 mg Also encouraged her to try taking the morning dose with her breakfast instead of lunch  HYPOKALEMIA: Controlled,  most likely is from high dose Florinef and is on  potassium supplements  Sweating episodes: She is not as symptomatic at this time To reconsider using Climara weekly patch for sweating if she has worsening symptoms  Plan:        Reduce Florinef to 2 tablets a day   Continue current potassium supplements  Reduce morning out a cortisone to 15 mg  Start checking blood pressure regularly at home including standing readings  Katrina Rivera

## 2013-04-29 ENCOUNTER — Other Ambulatory Visit: Payer: Self-pay | Admitting: Neurology

## 2013-04-29 MED ORDER — FENTANYL 50 MCG/HR TD PT72: 50.0000 ug | MEDICATED_PATCH | TRANSDERMAL | Status: DC

## 2013-04-29 NOTE — Telephone Encounter (Signed)
Patient calling to request Fentanyl patch refill script to pick up tomorrow. Patient requesting a call when it is ready.

## 2013-04-30 ENCOUNTER — Telehealth: Payer: Self-pay | Admitting: Neurology

## 2013-04-30 NOTE — Telephone Encounter (Signed)
Pt calling to check the status of her fentaNYL (DURAGESIC - DOSED MCG/HR) 50 MCG/HR prescription.  Please call to advise.  Thank you

## 2013-04-30 NOTE — Telephone Encounter (Signed)
Patient indicates Fentanyl is making her very tired.  She would like to know if perhaps the dose could be changed to instead.  Please advise.  Thank you.

## 2013-04-30 NOTE — Telephone Encounter (Signed)
I called patient back.  Advised we will call her once Rx has been signed.  She verbalized understanding.

## 2013-04-30 NOTE — Telephone Encounter (Signed)
Patient called back to request if Fentanyl patch comes in 25 instead of 50, she wanted to check before Dr. Terrace Arabia signed for the medication.  Patient requesting a call when it is ready or if also if you have any ?'s she is stating she thinks that the 50 might be making her so tired and wanted to see if there is a 25 to help her get a little stronger and not be so tired. Thanks

## 2013-05-01 ENCOUNTER — Other Ambulatory Visit: Payer: Self-pay | Admitting: Neurology

## 2013-05-01 MED ORDER — FENTANYL 25 MCG/HR TD PT72: 25.0000 ug | MEDICATED_PATCH | TRANSDERMAL | Status: DC

## 2013-05-01 MED ORDER — FENTANYL 25 MCG/HR TD PT72: 50.0000 ug | MEDICATED_PATCH | TRANSDERMAL | Status: DC

## 2013-05-01 NOTE — Telephone Encounter (Signed)
Called pt to inform her that her Rx was ready to be picked up at the front desk and if she has any other problems, questions or concerns to call the office. Pt verbalized understanding. °

## 2013-05-01 NOTE — Telephone Encounter (Signed)
I have changed her Fentanyl to 25 mcg q 72 hours.

## 2013-05-08 ENCOUNTER — Telehealth: Payer: Self-pay | Admitting: Endocrinology

## 2013-05-08 NOTE — Telephone Encounter (Signed)
Patient was called: Encouraged the patient to stay on hydrocortisone since it is not the cause of her weight gain and she has had documented adrenal insufficiency. She will try to take the morning doses with her lunch meal to avoid abdominal discomfort

## 2013-05-08 NOTE — Telephone Encounter (Signed)
Pt would like to stop the hydrocortisone pelase advise

## 2013-05-08 NOTE — Telephone Encounter (Signed)
Patient wants to stop taking the hydrocortisone, she said she's gained 40 lbs and would like to go off and see what happens.  She said her bp has been pretty good, top number runs 123, 141, 148.  Please advise.  [phone] (H)

## 2013-05-14 ENCOUNTER — Ambulatory Visit: Payer: Medicare Other | Admitting: Nurse Practitioner

## 2013-05-20 ENCOUNTER — Telehealth: Payer: Self-pay | Admitting: Endocrinology

## 2013-05-20 NOTE — Telephone Encounter (Signed)
Please see below.

## 2013-05-20 NOTE — Telephone Encounter (Signed)
Pt would like to discuss the prednisone, she has no energy, and bruising easily

## 2013-05-20 NOTE — Telephone Encounter (Signed)
Discussed with patient. She insists on trying to reduce her hydrocortisone since she thinks it is causing abdominal discomfort, loose stools and bruising. Also feeling tired and gaining weight. She is off her gabapentin. Advised her to take half a tablet in the evenings for the next week and next week reduce the morning dose to 1 tablet until the next visit

## 2013-05-29 ENCOUNTER — Telehealth: Payer: Self-pay | Admitting: *Deleted

## 2013-05-29 NOTE — Telephone Encounter (Signed)
Pt calling stating that she is taking Oxcarbazepine 300 mg twice daily. Pt states that it makes her sleepy and tired all the time and would like to know if she could just take 150 mg twice daily. Please advise

## 2013-05-29 NOTE — Telephone Encounter (Signed)
Katrina Rivera, please call patient, I have reviewed her chart, it is okay for her to take trileptal 150 mg twice a day

## 2013-05-30 ENCOUNTER — Other Ambulatory Visit: Payer: Self-pay

## 2013-05-30 MED ORDER — FENTANYL 25 MCG/HR TD PT72: 25.0000 ug | MEDICATED_PATCH | TRANSDERMAL | Status: DC

## 2013-05-30 NOTE — Telephone Encounter (Signed)
I spoke with patient.  She will try taking 150mg  twice daily to see if that helps.  She does not wish to have a new Rx sent at this time.  She has 300mg  tabs, which are scored, so she would like to cut those in half and complete the meds she has.  She will call us back if needed.

## 2013-05-31 NOTE — Telephone Encounter (Signed)
Called pt to inform her that her Rx was ready to be picked up at the front desk and if she has any other problems, questions or concerns to call the office. Pt verbalized understanding. °

## 2013-06-03 ENCOUNTER — Other Ambulatory Visit (INDEPENDENT_AMBULATORY_CARE_PROVIDER_SITE_OTHER): Payer: Medicare Other

## 2013-06-03 DIAGNOSIS — E2740 Unspecified adrenocortical insufficiency: Secondary | ICD-10-CM

## 2013-06-03 DIAGNOSIS — R5383 Other fatigue: Secondary | ICD-10-CM

## 2013-06-03 DIAGNOSIS — E2749 Other adrenocortical insufficiency: Secondary | ICD-10-CM

## 2013-06-03 DIAGNOSIS — R5381 Other malaise: Secondary | ICD-10-CM

## 2013-06-03 DIAGNOSIS — D649 Anemia, unspecified: Secondary | ICD-10-CM

## 2013-06-03 LAB — CBC WITH DIFFERENTIAL/PLATELET
BASOS PCT: 0.5 % (ref 0.0–3.0)
Basophils Absolute: 0 10*3/uL (ref 0.0–0.1)
Eosinophils Absolute: 0 10*3/uL (ref 0.0–0.7)
Eosinophils Relative: 1 % (ref 0.0–5.0)
HEMATOCRIT: 36.7 % (ref 36.0–46.0)
Hemoglobin: 12.1 g/dL (ref 12.0–15.0)
LYMPHS ABS: 1.7 10*3/uL (ref 0.7–4.0)
Lymphocytes Relative: 35.6 % (ref 12.0–46.0)
MCHC: 32.9 g/dL (ref 30.0–36.0)
MCV: 89.8 fl (ref 78.0–100.0)
MONO ABS: 0.5 10*3/uL (ref 0.1–1.0)
Monocytes Relative: 10.3 % (ref 3.0–12.0)
Neutro Abs: 2.6 10*3/uL (ref 1.4–7.7)
Neutrophils Relative %: 52.6 % (ref 43.0–77.0)
PLATELETS: 242 10*3/uL (ref 150.0–400.0)
RBC: 4.08 Mil/uL (ref 3.87–5.11)
RDW: 14.7 % — ABNORMAL HIGH (ref 11.5–14.6)
WBC: 4.9 10*3/uL (ref 4.5–10.5)

## 2013-06-03 LAB — COMPREHENSIVE METABOLIC PANEL
ALBUMIN: 3.8 g/dL (ref 3.5–5.2)
ALK PHOS: 42 U/L (ref 39–117)
ALT: 12 U/L (ref 0–35)
AST: 23 U/L (ref 0–37)
BUN: 14 mg/dL (ref 6–23)
CO2: 27 mEq/L (ref 19–32)
Calcium: 9.4 mg/dL (ref 8.4–10.5)
Chloride: 103 mEq/L (ref 96–112)
Creatinine, Ser: 0.9 mg/dL (ref 0.4–1.2)
GFR: 66.65 mL/min (ref >60.00)
Glucose, Bld: 90 mg/dL (ref 70–99)
Potassium: 5 mEq/L (ref 3.5–5.1)
Sodium: 138 mEq/L (ref 135–145)
Total Bilirubin: 0.3 mg/dL (ref 0.3–1.2)
Total Protein: 6.8 g/dL (ref 6.0–8.3)

## 2013-06-03 LAB — T4, FREE: Free T4: 0.61 ng/dL (ref 0.60–1.60)

## 2013-06-06 ENCOUNTER — Ambulatory Visit (INDEPENDENT_AMBULATORY_CARE_PROVIDER_SITE_OTHER): Payer: Medicare Other | Admitting: Endocrinology

## 2013-06-06 ENCOUNTER — Encounter: Payer: Self-pay | Admitting: Endocrinology

## 2013-06-06 VITALS — BP 118/62 | HR 76 | Temp 98.0°F | Resp 14 | Ht 63.0 in | Wt 138.4 lb

## 2013-06-06 DIAGNOSIS — I951 Orthostatic hypotension: Secondary | ICD-10-CM

## 2013-06-06 DIAGNOSIS — G903 Multi-system degeneration of the autonomic nervous system: Secondary | ICD-10-CM

## 2013-06-06 DIAGNOSIS — G238 Other specified degenerative diseases of basal ganglia: Secondary | ICD-10-CM

## 2013-06-06 DIAGNOSIS — E2749 Other adrenocortical insufficiency: Secondary | ICD-10-CM

## 2013-06-06 DIAGNOSIS — E038 Other specified hypothyroidism: Secondary | ICD-10-CM

## 2013-06-06 DIAGNOSIS — E2740 Unspecified adrenocortical insufficiency: Secondary | ICD-10-CM

## 2013-06-06 NOTE — Progress Notes (Signed)
Patient ID: Iven FinnLinda M Bye, female   DOB: 06-08-1945, 68 y.o.   MRN: [id number]   Subjective:      Chief complaint: Followup of multiple problems  PAST history: She has had long-standing problems with orthostatic hypotension and also hyponatremia. Has been diagnosed with dysautonomia and has multiple problems related to autonomic neuropathy.  Also has been diagnosed with secondary adrenal insufficiency  She has been on Florinef since 2004 usually taking 3 tablets daily along with 5 tablets of potassium which had previously controlled her symptoms well . She apparently does better with the regimen of one tablet in the morning and 2 in the evening. Blood pressure tends to get high if she increases Florinef to 4 tablets  Because of persistent orthostatic symptoms she had been given midodrine in  05/28/12.  She has had medication adjustments done frequently over the last year for balancing out her blood pressure. 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  Prior testing includes Cortrosyn test baseline with stimulated level of 15.7, baseline 3.9.  The response to therapy was Improved symptoms with better appetite, no further weight loss and less frequent nausea. At first did not tolerate hydrocortisone and was given prednisone. Was having abdominal distress with taking prednisone at breakfast and also  had insomnia, stomach pain, nausea and was switched back to hydrocortisone However about a month ago she was complaining that she was gaining weight and having excessive bruising, abdominal discomfort and increased appetite from hydrocortisone and wanted to stop it. She has tapered this off and has not taken any for the last week Has not had any recurrence of the above addisonian symptoms since then and sodium is normal Her weight is down 4 pounds also  Wt Readings from Last 3 Encounters:  06/06/13 138 lb 6.4 oz (62.778 kg)  04/25/13 142 lb 9.6 oz (64.683 kg)  03/01/13 139 lb (63.05 kg)    General Endocrinology:    Pituitary: She  did have a low free T4 previously but did not tolerate thyroxine supplementation and free T4 subsequently had been consistently normal without any treatment Is now relatively low but she does not feel fatigued Daily she feels better with stopping her hormone patch also       Medication List       This list is accurate as of: 06/06/13 10:51 AM.  Always use your most recent med list.               bifidobacterium infantis capsule  Take 1 capsule by mouth daily.     calcium-vitamin D 500-200 MG-UNIT per tablet  Commonly known as:  OSCAL WITH D  Take 1 tablet by mouth 2 (two) times daily.     clonazePAM 0.5 MG tablet  Commonly known as:  KLONOPIN  Take 0.5 mg by mouth at bedtime.     docusate sodium 100 MG capsule  Commonly known as:  COLACE  Take 100 mg by mouth daily.     DULoxetine 60 MG capsule  Commonly known as:  CYMBALTA  Take 1 capsule (60 mg total) by mouth daily.     fentaNYL 25 MCG/HR patch  Commonly known as:  DURAGESIC - dosed mcg/hr  Place 1 patch (25 mcg total) onto the skin every 3 (three) days.     Ferrous Sulfate Dried 200 (65 FE) MG Tabs  Take 65 mg by mouth daily.     fludrocortisone 0.1 MG tablet  Commonly known as:  FLORINEF  Take 0.1-0.2 mg by mouth 2 (two) times daily. One tablet in the morning and two tablets at  night.     fluticasone 50 MCG/ACT nasal spray  Commonly known as:  FLONASE  Place 2 sprays into both nostrils at bedtime as needed for allergies or rhinitis.     ibuprofen 200 MG tablet  Commonly known as:  ADVIL,MOTRIN  Take 400 mg by mouth 2 (two) times daily as needed (pain).     lansoprazole 30 MG capsule  Commonly known as:  PREVACID  Take 30 mg by mouth daily before breakfast.     multivitamin with minerals Tabs tablet  Take 1 tablet by mouth daily.     omega-3 acid ethyl esters 1 G capsule  Commonly known as:  LOVAZA  Take 1 g by mouth 2 (two) times daily.     Oxcarbazepine 300 MG tablet  Commonly known as:  TRILEPTAL  Take 150 mg by mouth 2 (two) times daily.     potassium chloride 10 MEQ tablet  Commonly known as:  K-DUR,KLOR-CON  Take 2-3 tablets (20-30 mEq total) by mouth 2 (two) times daily. Take 3 in the morning and two at bedtime.     PRESCRIPTION MEDICATION  Apply 1 application topically at bedtime. meloxicam 0.5/doxepin 3%/amantadine 3%/dextromethorphan 2%/Lidocaine 2%. Compounded prescription.  Apply at 8pm and repeat at 10pm if needed.     SYSTANE OP  Apply 2 drops to eye daily as needed (for dry eyes).     tamsulosin 0.4 MG Caps capsule  Commonly known as:  FLOMAX  Take 0.4 mg by mouth daily after breakfast.        Review of Systems    Increased sweating:  she gets hot flashes and periodic sweating in day. She is not complaining of worsening episodes of marked sweating. Did not tolerate propantheline. Had been slightly better with Climara 0.1 mg and subsequently  taking Vivelle but only 0.05 mg twice a week; Stopped this because of cost. Effexor was also tried but she did not tolerate this and also sweating was no better She was started on clonidine patches on the last visit and do symptomatically better She was started on hormone patches but she stopped this because he felt it was causing drowsiness  Genitourinary: Positive for difficulty  urinating.       She was taking urecholine for her  neurogenic bladder  but stopped this because of excessive salivation Neurological: Positive for numbness.       Has peripheral neuropathy, symptomatic of unclear etiology and followed by neurologist  She has had significant amount of neuropathic pain in her feet and legs and was not relieved with large doses of gabapentin. She is on Cymbalta and Trileptal.   She has been anemic and was given iron by PCP. Hemoglobin is back to normal    Labs:  Appointment on 06/03/2013  Component Date Value Ref Range Status  . Sodium  06/03/2013 138  135 - 145 mEq/L Final  . Potassium 06/03/2013 5.0  3.5 - 5.1 mEq/L Final  . Chloride 06/03/2013 103  96 - 112 mEq/L Final  . CO2 06/03/2013 27  19 - 32 mEq/L Final  . Glucose, Bld 06/03/2013 90  70 - 99 mg/dL Final  . BUN 16/11/9602 14  6 - 23 mg/dL Final  . Creatinine, Ser 06/03/2013 0.9  0.4 - 1.2 mg/dL Final  . Total Bilirubin 06/03/2013 0.3  0.3 - 1.2 mg/dL Final  . Alkaline Phosphatase 06/03/2013 42  39 - 117 U/L Final  . AST 06/03/2013 23  0 - 37 U/L Final  . ALT 06/03/2013 12  0 - 35 U/L Final  . Total Protein 06/03/2013 6.8  6.0 - 8.3 g/dL Final  . Albumin 54/10/8117 3.8  3.5 - 5.2 g/dL Final  . Calcium 14/78/2956 9.4  8.4 - 10.5 mg/dL Final  . GFR 21/30/8657 66.65  >60.00 mL/min Final  . Free T4 06/03/2013 0.61  0.60 - 1.60 ng/dL Final  . WBC 84/69/6295 4.9  4.5 - 10.5 K/uL Final  . RBC 06/03/2013 4.08  3.87 - 5.11 Mil/uL Final  . Hemoglobin 06/03/2013 12.1  12.0 - 15.0 g/dL Final  . HCT 28/41/3244 36.7  36.0 - 46.0 % Final  . MCV 06/03/2013 89.8  78.0 - 100.0 fl Final  . MCHC 06/03/2013 32.9  30.0 - 36.0 g/dL Final  . RDW 02/09/7251 14.7* 11.5 - 14.6 % Final  . Platelets 06/03/2013 242.0  150.0 - 400.0 K/uL Final  . Neutrophils Relative % 06/03/2013 52.6  43.0 - 77.0 % Final  . Lymphocytes Relative 06/03/2013 35.6  12.0 - 46.0 % Final  . Monocytes Relative 06/03/2013 10.3  3.0 - 12.0 %  Final  . Eosinophils Relative 06/03/2013 1.0  0.0 - 5.0 % Final  . Basophils Relative 06/03/2013 0.5  0.0 - 3.0 % Final  . Neutro Abs 06/03/2013 2.6  1.4 - 7.7 K/uL Final  . Lymphs Abs 06/03/2013 1.7  0.7 - 4.0 K/uL Final  . Monocytes Absolute 06/03/2013 0.5  0.1 - 1.0 K/uL Final  . Eosinophils Absolute 06/03/2013 0.0  0.0 - 0.7 K/uL Final  . Basophils Absolute 06/03/2013 0.0  0.0 - 0.1 K/uL Final       Objective:   Physical Exam  BP 118/62  Pulse 76  Temp(Src) 98 F (36.7 C)  Resp 14  Ht 5\' 3"  (1.6 m)  Wt 138 lb 6.4 oz (62.778 kg)  BMI 24.52 kg/m2  SpO2 95%  Above blood pressure is standing No pedal edema  Assessment:      Orthostatic dysautonomic hypotension syndrome:   Her symptoms are controlled with Florinef 3 tablets a day  Electrolytes are normal  Adrenal insufficiency: She has no symptoms at this time despite tapering off her hydrocortisone  Discussed that if she starts having difficulties with decreased appetite, nausea, weakness and orthostasis will need to reconsider hydrocortisone   HYPOKALEMIA: Controlled,   this is likely from high dose Florinef and is  controlled on  potassium supplements  Sweating episodes: She is  tolerating symptoms now   ? Secondary hypothyroidism: Free T4 is low normal and will continue to monitor  Plan:       As above   Start checking blood pressure regularly at home including standing readings  Reather Littler

## 2013-06-06 NOTE — Patient Instructions (Signed)
Call if losing appetite or energy  Check BP 2x per week

## 2013-07-08 ENCOUNTER — Telehealth: Payer: Self-pay | Admitting: Neurology

## 2013-07-08 ENCOUNTER — Telehealth: Payer: Self-pay | Admitting: Endocrinology

## 2013-07-08 ENCOUNTER — Other Ambulatory Visit: Payer: Self-pay

## 2013-07-08 ENCOUNTER — Other Ambulatory Visit: Payer: Self-pay | Admitting: *Deleted

## 2013-07-08 MED ORDER — FLUDROCORTISONE ACETATE 0.1 MG PO TABS: ORAL_TABLET | ORAL | Status: DC

## 2013-07-08 MED ORDER — FENTANYL 25 MCG/HR TD PT72: 25.0000 ug | MEDICATED_PATCH | TRANSDERMAL | Status: DC

## 2013-07-08 NOTE — Telephone Encounter (Signed)
I called back.  Patient said she spoke with her PCP and they discussed Cymbalta.  She feels the dose may be too high, and he recommended she ask [place] for a dose decrease from 60mg  daily to 30mg  daily.  If approved, she would like a 90 day Rx sent to [facility].  Patient states she also would like an analgesic for her legs, perhaps Lidocaine patches?  She said the compound cream has Lidocaine in it, but wears off, so she has to reapply throughout the day.  She is afraid if we change the compound cream, her price will go up.  She currently pays $40 per fill, but was previously told by the pharmacy a different formulation would cost $1000 per fill.   Please advise.  Thank you.

## 2013-07-08 NOTE — Telephone Encounter (Signed)
Patient called back requesting a 90 day Rx for Fentanyl so she does not have to make a trip here each month.  Advised her it will be up to her ins if they will pay for 90 days at the retail pharmacy.  She verbalized understanding, and if they do not cover 90 days, she will get 30 days per fill in the future.

## 2013-07-08 NOTE — Telephone Encounter (Signed)
Patient requesting refills for  fentaNYL (DURAGESIC - DOSED MCG/HR) 25 MCG/HR patch,  DULoxetine (CYMBALTA) 60 MG capsule 30 mg instead of 60 mg, and an compound order by Dr Terrace ArabiaYan, Eleoxi/doxep/amanta/dxm/lidocaine.  Pt requesting compound with more numbing effect.  Thanks

## 2013-07-08 NOTE — Telephone Encounter (Signed)
fludrocoritisone pt need this med called in to [facility] please with 3 refills with 90 day

## 2013-07-09 MED ORDER — DULOXETINE HCL 30 MG PO CPEP
30.0000 mg | ORAL_CAPSULE | Freq: Every day | ORAL | Status: DC
Start: 2013-07-09 — End: 2014-02-27

## 2013-07-09 MED ORDER — LIDOCAINE 4 % EX PTCH: 1.0000 | MEDICATED_PATCH | Freq: Every morning | CUTANEOUS | Status: DC

## 2013-07-09 NOTE — Telephone Encounter (Signed)
Katrina Rivera, please let her know that I have called in Lidocaine patch and cymbalta 30mg  qday x 3 months supple

## 2013-07-09 NOTE — Telephone Encounter (Signed)
rx sent

## 2013-07-10 NOTE — Telephone Encounter (Signed)
I called back and spoke with the patient.  She is aware.

## 2013-07-11 ENCOUNTER — Telehealth: Payer: Self-pay | Admitting: Neurology

## 2013-07-11 ENCOUNTER — Other Ambulatory Visit: Payer: Self-pay

## 2013-07-11 MED ORDER — LIDOCAINE 4 % EX PTCH: 1.0000 | MEDICATED_PATCH | Freq: Every morning | CUTANEOUS | Status: DC

## 2013-07-11 NOTE — Telephone Encounter (Signed)
Lynden Ang, could you please check on this Rx?  Thank you.

## 2013-07-11 NOTE — Telephone Encounter (Signed)
Patient spoke with Lynden Ang and asked that her Rx be resent to the local pharmacy so she could pick them up sooner.

## 2013-07-11 NOTE — Telephone Encounter (Signed)
Patient calling to state that Lidocaine patches are not available at [facility] and so she needs it sent to her mail order pharmacy. Patient also states that [facility] can only fill her Fentanyl script for 30 days so she needs another script to be sent to [facility] so she can get it in 90 day supply. Please return call and advise.

## 2013-07-11 NOTE — Telephone Encounter (Signed)
Patient calling regarding her refills. She said that she has been waiting for Dr. Terrace Arabia to sign off on one but has not heard anything and would like a call back regarding the status. Please call.

## 2013-07-11 NOTE — Telephone Encounter (Signed)
I called the pharmacy regarding Lidocaine Patches.  Spoke with the pharmacist who will change the order to generic Lidocaine 5%.  I called the patient back.  She said [facility] would not be able to fill the patches either.  Explained [facility] will attempt to fill the generic Lidocaine for her.  As well advised we will rewrite the Fentanyl for 90 days and mail the Rx for that to her along with the cooling vest.  Patient was very appreciative.  Says she will call us back if anything further is needed.

## 2013-07-11 NOTE — Telephone Encounter (Signed)
This is the 90 day order the patient will sent to mail order since the local pharmacy was unable to fill 90 days.

## 2013-07-11 NOTE — Telephone Encounter (Signed)
[facility] said they are unable to get Lidocaine 4% patches and would like to know if they can change the Rx to 5% patches, which are available in generic.  As well, the patient wants [place] to write another Rx for Fentanyl.  She picked up a 90 day written Rx today, however, her ins will only allow 30 days at the local pharmacy.  She wants another Rx for 90 days for her to pick up and send to mail order.  In addition, she would like to know if she needs to be prescribed a cooling vest because she is always "hot and sweaty" and she knows it is not related to menopause.  Please advise.  Thank you.

## 2013-07-11 NOTE — Telephone Encounter (Signed)
Please advise 

## 2013-07-11 NOTE — Telephone Encounter (Signed)
Katrina Rivera: Ok to change her Lidocaine patch to 5%.  I can write her fentanyl patch for 90 day supply, please double check the dosage, I will cosign your order  I have written cool vest prescription for her, will mail it to her.

## 2013-07-11 NOTE — Telephone Encounter (Signed)
Called pt to inform her that her Rx was ready to be picked up at the front desk and if she has any other problems, questions or concerns to call the office. Pt verbalized understanding. °

## 2013-07-11 NOTE — Telephone Encounter (Signed)
Called pt to inform her that her Rx was ready to be picked up and that the pt's medication Lidocaine has been resent to [facility]. I advised the pt that if she has any other problems, questions or concerns to call the office. Pt verbalized understanding.

## 2013-07-12 ENCOUNTER — Telehealth: Payer: Self-pay | Admitting: Neurology

## 2013-07-12 MED ORDER — LIDOCAINE 5 % EX OINT: 1.0000 "application " | TOPICAL_OINTMENT | Freq: Every day | CUTANEOUS | Status: DC | PRN

## 2013-07-12 MED ORDER — FENTANYL 25 MCG/HR TD PT72: 25.0000 ug | MEDICATED_PATCH | TRANSDERMAL | Status: DC

## 2013-07-12 NOTE — Telephone Encounter (Signed)
Synetta Fail with [facility] [employer] calling regarding the Lidocaine Patch for patient--[employer] is unable to approve medication because it is not FDA approved for the diagnosis given--patient has been notified--a letter will be sent to patient and to our office.

## 2013-07-12 NOTE — Telephone Encounter (Signed)
Patient requesting Rx for Lidocaine gel due to Insurance will not pay for Patch.  Stated 90 day scripts need to go to [facility] and 30 days scripts need to go to [facility].  Please call and advise.

## 2013-07-12 NOTE — Telephone Encounter (Signed)
I called the pharmacy.  Spoke with Olegario MessierKathy.  She ran a test claim on the Lidocaine 5% ointment (rather than 5% patches-med is the same strength, both are applied topically).  She said the Ointment is covered under the patients plan.  Sent request to provider for approval

## 2013-07-15 NOTE — Telephone Encounter (Signed)
Pt's Rx was mailed out today. °

## 2013-07-22 ENCOUNTER — Telehealth: Payer: Self-pay | Admitting: Neurology

## 2013-07-22 NOTE — Telephone Encounter (Signed)
I called the patient back.  She has now decided she does not want to use Lidocaine alone, and would like something else prescribed.  States nothing works and she is in constant pain.  She has tried and failed numerous medications including: Biofreeze, Menthol Patches (i.e. Icy hot), Ibuprofen, Naproxen, Tylenol, Prednisone, Robaxin, Flexeril, Baclofen, Soma, Klonopin, Cymbalta, Trileptal, Compound Cream (contained: meloxicam 0.5/doxepin 3%/amantadine 3%/dextromethorphan 2%/Lidocaine 2%), Fentanyl, Percocet, Amitriptyline, Darvocet,  Butrans, Lyrica, Tylenol with Codeine, Florinef, Hydrocodone, Neurontin, Requip, Requip XL, Neupro and Ultram.   She is requesting something else be prescribed to "numb her legs and feet" due to pain.  I was not sure if you wanted the patient to remain on compound cream or try something different.   Incase you want to keep her on the cream, I called the compound pharmacy at [phone] and spoke with Darryl to see what is the highest strength of Lidocaine they can dispense.  He said they can change the strength up to 5% total.  If you would like to proceed with this change, I will gladly call them back to advise, otherwise please advise regarding drug change to something different.  Thank you.  Patient has an app July 23rd with NP.

## 2013-07-22 NOTE — Telephone Encounter (Signed)
Patient calling to state that she hasn't heard anything regarding the Lidocaine ointment, states that she hasn't been getting any sleep at night due to her restless legs, please return call to patient and advise.

## 2013-07-24 NOTE — Telephone Encounter (Signed)
I called the pharmacy back.  Spoke with Viviann SpareSteven.  [facility]  Gave verbal order to change Lidocaine to 5%.  They will be in contact with the patient regarding shipment.  I called the patient back.  She is aware and will call us back if anything further is needed.

## 2013-07-24 NOTE — Telephone Encounter (Signed)
Shanda BumpsJessica, thanks for the excellent works, please go ahead with changes of her lidocaine strength up to 5%.

## 2013-08-05 ENCOUNTER — Other Ambulatory Visit (INDEPENDENT_AMBULATORY_CARE_PROVIDER_SITE_OTHER): Payer: Medicare Other

## 2013-08-05 DIAGNOSIS — E2740 Unspecified adrenocortical insufficiency: Secondary | ICD-10-CM

## 2013-08-05 DIAGNOSIS — E2749 Other adrenocortical insufficiency: Secondary | ICD-10-CM

## 2013-08-05 DIAGNOSIS — E038 Other specified hypothyroidism: Secondary | ICD-10-CM

## 2013-08-05 LAB — T4, FREE: Free T4: 0.5 ng/dL — ABNORMAL LOW (ref 0.60–1.60)

## 2013-08-05 LAB — TSH: TSH: 0.65 u[IU]/mL (ref 0.35–4.50)

## 2013-08-05 LAB — COMPREHENSIVE METABOLIC PANEL
ALT: 12 U/L (ref 0–35)
AST: 23 U/L (ref 0–37)
Albumin: 3.7 g/dL (ref 3.5–5.2)
Alkaline Phosphatase: 50 U/L (ref 39–117)
BUN: 11 mg/dL (ref 6–23)
CALCIUM: 9.2 mg/dL (ref 8.4–10.5)
CHLORIDE: 102 meq/L (ref 96–112)
CO2: 28 meq/L (ref 19–32)
Creatinine, Ser: 0.8 mg/dL (ref 0.4–1.2)
GFR: 73.13 mL/min (ref >60.00)
Glucose, Bld: 67 mg/dL — ABNORMAL LOW (ref 70–99)
POTASSIUM: 4.2 meq/L (ref 3.5–5.1)
SODIUM: 137 meq/L (ref 135–145)
TOTAL PROTEIN: 6.8 g/dL (ref 6.0–8.3)
Total Bilirubin: 0.4 mg/dL (ref 0.2–1.2)

## 2013-08-08 ENCOUNTER — Ambulatory Visit (INDEPENDENT_AMBULATORY_CARE_PROVIDER_SITE_OTHER): Payer: Medicare Other | Admitting: Endocrinology

## 2013-08-08 ENCOUNTER — Encounter: Payer: Self-pay | Admitting: Endocrinology

## 2013-08-08 VITALS — BP 120/70 | HR 81 | Temp 97.6°F | Ht 63.0 in | Wt 129.0 lb

## 2013-08-08 DIAGNOSIS — G238 Other specified degenerative diseases of basal ganglia: Secondary | ICD-10-CM

## 2013-08-08 DIAGNOSIS — G903 Multi-system degeneration of the autonomic nervous system: Secondary | ICD-10-CM

## 2013-08-08 DIAGNOSIS — E038 Other specified hypothyroidism: Secondary | ICD-10-CM

## 2013-08-08 DIAGNOSIS — I951 Orthostatic hypotension: Secondary | ICD-10-CM

## 2013-08-08 NOTE — Progress Notes (Signed)
Patient ID: Katrina FinnLinda M Rivera, female   DOB: 07/14/1945, 68 y.o.   MRN: [id number]   Subjective:     Chief complaint: Followup of multiple problems  PAST history: She has had long-standing problems with orthostatic hypotension and also hyponatremia. Has been diagnosed with dysautonomia and has multiple problems related to autonomic neuropathy.  Also has been diagnosed with secondary adrenal insufficiency  She has been on Florinef since 2004 usually taking 3 tablets daily along with 5 tablets of potassium which had previously controlled her symptoms well . She apparently does better with the regimen of one tablet in the morning and 2 in the evening. Blood pressure tends to get high if she increases Florinef to 4 tablets  Because of persistent orthostatic symptoms she had been given midodrine in  05/28/12.  She has had medication adjustments done frequently over the last year for balancing out her blood pressure. 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  Prior testing included Cortrosyn test baseline with stimulated level of 15.7, baseline 3.9.  The response to therapy was Improved symptoms with better appetite, no further weight loss and less frequent nausea. At first did not tolerate hydrocortisone and was given prednisone.  Was having abdominal distress with taking prednisone at breakfast and also had insomnia, stomach pain, nausea and was switched back to hydrocortisone However she stopped her hydrocortisone in 4/15 because of concerns about weight gain and excessive bruising, abdominal discomfort and increased appetite Has not had any recurrence of the above addisonian symptoms since then and sodium is still normal Her weight is down another 9 pounds and her appetite is fairly good  Wt Readings from Last 3 Encounters:  08/08/13 129 lb (58.514 kg)  06/06/13 138 lb 6.4 oz (62.778 kg)  04/25/13 142 lb 9.6 oz (64.683 kg)    General Endocrinology:    Pituitary: She  did have a low free T4 previously but did not tolerate thyroxine supplementation and free T4 subsequently had been consistently normal without any treatment Free T4 now again low but she does not feel fatigued; over the last day she had been trying any muscle relaxant and this was causing fatigue and has stopped it 2 days . Has no  cold intolerance  LABS:  Appointment on 08/05/2013  Component Date Value Ref Range Status  . Sodium 08/05/2013 137  135 - 145 mEq/L Final  . Potassium 08/05/2013 4.2  3.5 - 5.1 mEq/L Final  . Chloride 08/05/2013 102  96 - 112 mEq/L Final  . CO2 08/05/2013 28  19 - 32 mEq/L Final  . Glucose, Bld 08/05/2013 67* 70 - 99 mg/dL Final  . BUN 78/29/562106/29/2015 11  6 - 23 mg/dL Final  . Creatinine, Ser 08/05/2013 0.8  0.4 - 1.2 mg/dL Final  . Total Bilirubin 08/05/2013 0.4  0.2 - 1.2 mg/dL  Final  . Alkaline Phosphatase 08/05/2013 50  39 - 117 U/L Final  . AST 08/05/2013 23  0 - 37 U/L Final  . ALT 08/05/2013 12  0 - 35 U/L Final  . Total Protein 08/05/2013 6.8  6.0 - 8.3 g/dL Final  . Albumin 16/11/9602 3.7  3.5 - 5.2 g/dL Final  . Calcium 54/10/8117 9.2  8.4 - 10.5 mg/dL Final  . GFR 14/78/2956 73.13  >60.00 mL/min Final  . Free T4 08/05/2013 0.50* 0.60 - 1.60 ng/dL Final  . TSH 21/30/8657 0.65  0.35 - 4.50 uIU/mL Final           Medication List       This list is accurate as of: 08/08/13  8:35 AM.  Always use your most recent med list.               bifidobacterium infantis capsule  Take 1 capsule by mouth daily.     calcium-vitamin D 500-200 MG-UNIT per tablet  Commonly known as:  OSCAL WITH D  Take 1 tablet by mouth 2 (two) times daily.     clonazePAM 0.5 MG tablet  Commonly known as:  KLONOPIN  Take 0.5 mg by mouth at bedtime.     docusate sodium 100 MG capsule  Commonly known as:  COLACE  Take 100 mg by mouth daily.     DULoxetine 30 MG capsule  Commonly known as:  CYMBALTA  Take 1 capsule (30 mg total) by mouth daily.     fentaNYL 25 MCG/HR patch  Commonly known as:  DURAGESIC - dosed mcg/hr  Place 1 patch (25 mcg total) onto the skin every 3 (three) days.     Ferrous Sulfate Dried 200 (65 FE) MG Tabs  Take 65 mg by mouth daily.     fludrocortisone 0.1 MG tablet  Commonly known as:  FLORINEF  One tablet in the morning and two tablets at night.     fluticasone 50 MCG/ACT nasal spray  Commonly known as:  FLONASE  Place 2 sprays into both nostrils at bedtime as needed for allergies or rhinitis.     ibuprofen 200 MG tablet  Commonly known as:  ADVIL,MOTRIN  Take 400 mg by mouth 2 (two) times daily as needed (pain).     lansoprazole 30 MG capsule  Commonly known as:  PREVACID  Take 30 mg by mouth daily before breakfast.     lidocaine 5 %  Commonly known as:  LIDODERM  Place 1 patch onto the skin every morning. Remove & Discard patch within 12 hours or as directed by MD     lidocaine 5 % ointment  Commonly known as:  XYLOCAINE  Apply 1 application topically daily as needed for moderate pain.     multivitamin with minerals Tabs tablet  Take 1 tablet by mouth daily.     omega-3 acid ethyl esters 1 G capsule  Commonly known as:  LOVAZA  Take 1 g by mouth 2 (two) times daily.     Oxcarbazepine 300 MG tablet  Commonly known as:  TRILEPTAL  Take 150 mg by mouth 2 (two) times  daily.     potassium chloride 10 MEQ tablet  Commonly known as:  K-DUR,KLOR-CON  Take 2-3 tablets (20-30 mEq total) by mouth 2 (two) times daily. Take 3 in the morning and two at bedtime.     PRESCRIPTION MEDICATION  Apply 1 application topically at bedtime. meloxicam 0.5/doxepin 3%/amantadine 3%/dextromethorphan 2%/Lidocaine 2%. Compounded prescription.  Apply at 8pm  and repeat at 10pm if needed.     SYSTANE OP  Apply 2 drops to eye daily as needed (for dry eyes).     tamsulosin 0.4 MG Caps capsule  Commonly known as:  FLOMAX  Take 0.4 mg by mouth daily after breakfast.        Review of Systems    Increased sweating: Has had hot flashes and periodic sweating in the daytime  Did not tolerate propantheline. Had been slightly better with Climara 0.1 mg and subsequently  taking Vivelle  Stopped this because of cost. Effexor was also tried but she did not tolerate this and also sweating was no better She was also tried on clonidine patches without much relief She was retried on hormone patches but she stopped this because he felt it was causing drowsiness She does not complain of excessive symptoms now and she is trying different treatments for her heat intolerance which apparently has been related to her multiple sclerosis  Genitourinary: Positive for difficulty urinating.       She was taking urecholine for her  neurogenic bladder  but stopped this because of excessive salivation Neurological: Positive for numbness.       Has peripheral neuropathy, symptomatic of unclear etiology and followed by neurologist  She has had significant amount of neuropathic pain in her feet and legs and was not relieved with large doses of gabapentin. She is on Cymbalta and Trileptal and also local lidocaine   She has been anemic and was given iron by PCP. Hemoglobin is back to normal         Objective:   Physical Exam  BP 120/70  Pulse 81  Temp(Src) 97.6 F (36.4 C) (Oral)  Ht 5\' 3"  (1.6 m)   Wt 129 lb (58.514 kg)  BMI 22.86 kg/m2  SpO2 96%   The repeat blood pressure  Standing is 122/66 No pedal edema  Assessment:      Orthostatic dysautonomic hypotension syndrome:   Her symptoms are controlled with Florinef 3 tablets a day  Electrolytes are normal He has no orthostasis today and no symptoms at home also Recommended monitoring blood pressure periodically  Adrenal insufficiency: She has no symptoms at this time despite stopping off her hydrocortisone nearly 2 months ago Will consider repeat testing if she has symptoms  HYPOKALEMIA: Controlled,   this is likely from high dose Florinef and is  controlled on  potassium supplements  Sweating episodes: She is tolerating symptoms now   ? Secondary hypothyroidism: Free T4 is low but she is symptomatic and reluctant to try supplements; will continue to monitor  Plan:       As above      [facility]

## 2013-08-20 ENCOUNTER — Telehealth: Payer: Self-pay | Admitting: *Deleted

## 2013-08-20 NOTE — Telephone Encounter (Signed)
Called patient to r/s appointment due to Dr. Terrace Arabia out of office, patient was r/s to 08/22/13 at 8:30 with NP CM

## 2013-08-22 ENCOUNTER — Encounter: Payer: Self-pay | Admitting: Nurse Practitioner

## 2013-08-22 ENCOUNTER — Ambulatory Visit (INDEPENDENT_AMBULATORY_CARE_PROVIDER_SITE_OTHER): Payer: Medicare Other | Admitting: Nurse Practitioner

## 2013-08-22 VITALS — BP 139/73 | HR 74 | Wt 127.4 lb

## 2013-08-22 DIAGNOSIS — G35 Multiple sclerosis: Secondary | ICD-10-CM

## 2013-08-22 DIAGNOSIS — R269 Unspecified abnormalities of gait and mobility: Secondary | ICD-10-CM

## 2013-08-22 MED ORDER — ROTIGOTINE 1 MG/24HR TD PT24: 1.0000 mg | MEDICATED_PATCH | Freq: Every day | TRANSDERMAL | Status: DC

## 2013-08-22 NOTE — Patient Instructions (Signed)
Continue current meds Try neupro patch given samples of 29mf to use for 1 week then 2mg   Call for rx to be sent if this works F/U in 6 months

## 2013-08-22 NOTE — Progress Notes (Signed)
[facility]  PATIENT: Katrina FinnLinda M Rivera DOB: Jun 14, 1945   REASON FOR VISIT: follow up for MS, abnormal gait, neuropathic pain, RLS   HISTORY OF PRESENT ILLNESS:Katrina Rivera is a very pleasant 68 year old right-handed woman, unaccompanied  for followup of multiple sclerosis, neuropathic pain of bilateral lower extremities.  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  She was last seen by Dr. Terrace ArabiaYan 03/01/13. 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  She has baseline gait difficulty, she fell in May 2013, fracture in both elbows and her left kneecap.  She intermittently has been using a cane or walker.  She could not tolerate Requip because of nausea. She has been on gabapentin 300 mg 3 bid. She gained significant weight when on Lyrica, and therefore was switched back to gabapentin. She has orthostatic hypotension, and is on midodrine 2.5 mg and fludrocortisone 0.1 mg 1 in AM and 2 at night, and she is also on 20 mg of hydrocortisone.  She complains of bilateral lower extremity burning stinging sensation, getting worse when she sits still, difficulty falling into sleep, she has the urge to move because of bilateral extremity discomfort, has tried Requip, could not tolerate it because of GI side effects, Neuprol patch does not work either, she also tried Elavil, Cymbalta in the past, could not tolerate it due to side effect.  Jan 01/2014:  She came in urgently for acute worsening of her generalized condition since her lumbar decompression  surgery in January 24 2013 by [name], prior to surgery, she suffered left-sided low back pain, radiating pain to her left lower extremity, left is much worse than her right side, there was left L5-S1 extraforaminal herniated nucleus pulposus with left L5 radiculopathy she had left L5-S1 extraforaminal microdiscectomy utilizing microscopic dissection.  She had overnight stay at the hospital the day after surgery, she fell when trying to get up using bathroom, landed on her left side, ever since surgery, her lower back pain, radiating pain to her left leg has much improved, but she complained of worsening bilateral lower extremity deep achy pain, worsening gait difficulty, she has not used walker for 3 years, now she began to use her walker, she felt her skin is so tight at bilateral lower extremity, left leg swelling, also has difficulty swallowing, blurred vision, worsening fatigue, difficulty concentration, she has not had MRI evaluation for her multiple sclerosis for many years, is not on any immunomodulation therapy  UPDATE Mar 01, 2013:  She came in with a list of complains, much worse compared to presurgical level. She complains of blurred vision, difficulty sleeping, difficulty concentrating, dizziness, worsening gait difficulty, The most bothersome symptoms are bilateral lower extremity pain, constant, knife cutting pain, left worse than right she has been dealing with her bilateral lower extremity pain for many years, much worse recently,  Left leg showed no DVT on doppler study. She had 15 LB weight gain over one month, more difficulty sleepy, body shaking, more difficulty sleepy.  She has tried fentanyl patch 50 mcg, Trileptal, Neurontin without helping her symptoms .She is going to be seen by her  surgeon Dr. Yetta Barre in 3 days  We have revealed MRI of the brain cervical lumbar spine together done at [facility] in January 2015  At L5-S1: disc bulging and facet hypertrophy, status post  microdiscectomy on the left, with expected post-surgical changes. Multi-level facet hypertrophy, with no spinal stenosis or foraminal narrowing  Possible small chronic demyelinating plaque at C6 on the right side. No abnormal enhancing lesions  Multiple supratentorial and 1 infratentorial chronic demyelinating plaques. No acute plaques. Mild cerebellar tonsillar ectopia  We also reviewed, and compared to her previous MRI in 2010 from [facility], there was no significant change in the brain lesions,   UPDATE 08/22/13 Patient returns for follow up. Her biggest  complaint today is restless legs. She has never tried Neupro. She has had multiple trials of medications for her chronic pain. She is currently taking fentanyl, Trileptal, Cymbalta, and lidocaine gel. Repeat MRI of the brain, cervical spine and, and lumbar spine after her last visit with Dr. Terrace Arabia without changes from previous. She returns for reevaluation   REVIEW OF SYSTEMS: Full 14 system review of systems performed and notable only for those listed, all others are neg:  Constitutional: N/A  Cardiovascular: N/A  Ear/Nose/Throat: N/A  Skin: N/A  Eyes: N/A  Respiratory: N/A  Gastroitestinal: N/A  Hematology/Lymphatic: N/A  Endocrine: Intolerance to heat and Musculoskeletal:N/A  Allergy/Immunology: N/A  Neurological: N/A Psychiatric: N/A Sleep : Restless legs   ALLERGIES: No Known Allergies  HOME MEDICATIONS: Outpatient Prescriptions Prior to Visit  Medication Sig Dispense Refill  . bifidobacterium infantis (ALIGN) capsule Take 1 capsule by mouth daily.      . calcium-vitamin D (OSCAL WITH D) 500-200 MG-UNIT per tablet Take 1 tablet by mouth 2 (two) times daily.      . DULoxetine (CYMBALTA) 30 MG capsule Take 1 capsule (30 mg total) by mouth daily.  90 capsule  3  . fentaNYL (DURAGESIC - DOSED MCG/HR) 25 MCG/HR patch Place 1 patch (25 mcg total) onto the skin every 3 (three) days.  30 patch  0  . Ferrous Sulfate Dried 200 (65  FE) MG TABS Take 65 mg by mouth daily.      . fludrocortisone (FLORINEF) 0.1 MG tablet One tablet in the morning and two tablets at night.  270 tablet  3  . fluticasone (FLONASE) 50 MCG/ACT nasal spray Place 2 sprays into both nostrils at bedtime as needed for allergies or rhinitis.      Marland Kitchen ibuprofen (ADVIL,MOTRIN) 200 MG tablet Take 400 mg by mouth 2 (two) times daily as needed (pain).      Marland Kitchen lansoprazole (PREVACID) 30 MG capsule Take 30 mg by mouth daily before breakfast.       . Multiple Vitamin (MULITIVITAMIN WITH MINERALS) TABS Take 1 tablet by mouth daily.      Marland Kitchen omega-3 acid ethyl esters (LOVAZA) 1 G capsule Take 1 g by mouth 2 (two) times daily.      . Oxcarbazepine (TRILEPTAL) 300 MG tablet Take 150 mg by mouth 2 (two) times daily.       Bertram Gala Glycol-Propyl Glycol (SYSTANE OP) Apply 2 drops to eye daily as needed (for dry eyes).      . potassium chloride (K-DUR,KLOR-CON) 10 MEQ tablet Take 2-3 tablets (20-30 mEq total) by mouth 2 (two) times daily. Take 3 in the morning and two at bedtime.  540 tablet  3  . PRESCRIPTION MEDICATION Apply 1 application topically at bedtime. meloxicam 0.5/doxepin 3%/amantadine 3%/dextromethorphan 2%/Lidocaine 2%. Compounded prescription.  Apply at 8pm and repeat at 10pm if needed.      . tamsulosin (FLOMAX) 0.4 MG CAPS capsule Take 0.4 mg by mouth daily after breakfast.      . clonazePAM (KLONOPIN) 0.5 MG tablet Take 0.5 mg by mouth at bedtime.      . docusate sodium (COLACE) 100 MG capsule Take 100 mg by mouth daily.      Marland Kitchen lidocaine (LIDODERM) 5 % Place 1 patch onto the skin every morning. Remove & Discard patch within 12 hours or as directed by MD      . lidocaine (XYLOCAINE) 5 % ointment Apply 1 application topically daily as needed for moderate pain.  30 g  12   No facility-administered medications prior to visit.    PAST MEDICAL HISTORY: Past Medical History  Diagnosis Date  . Fibromyalgia   . Restless leg syndrome   . Multiple sclerosis     . Arthritis   . Osteoporosis   . MS (multiple sclerosis) 08/15/2012  . Hypotension   . Depression   . Anxiety   . Anemia   . Headache(784.0)   . GERD (gastroesophageal reflux disease)     PAST SURGICAL HISTORY: Past Surgical History  Procedure Laterality Date  . Appendectomy    . Colectomy    . Cholecystectomy    . Cesarean section      x2  . Abdominal hysterectomy    . Fracture surgery      R leg, rod & screws   . Eye surgery      bilateral - /w IOL  . Lumbar laminectomy/decompression microdiscectomy Left 01/24/2013    Procedure: LUMBAR FIVE TO SACRAL ONE LUMBAR LAMINECTOMY/DECOMPRESSION MICRODISCECTOMY 1 LEVEL;  Surgeon: Tia Alert, MD;  Location: [facility];  Service: Neurosurgery;  Laterality: Left;    FAMILY HISTORY: Family History  Problem Relation Age of Onset  . Hypertension Mother   . Stroke Mother   . Heart attack Father     SOCIAL HISTORY: History   Social History  . Marital Status: Married    Spouse Name: [name]    Number of Children: 2  . Years of Education: 12   Occupational History  .      Disabled   Social History Main Topics  . Smoking status: Never Smoker   . Smokeless tobacco: Never Used  . Alcohol Use: No  . Drug Use: No  . Sexual Activity: Not on file   Other Topics Concern  . Not on file   Social History Narrative   Pt lives at home with spouse. ([name])   Caffeine Use: 1 cups daily.   Right handed.   Disabled.   Education - high school   Patient has two adult children.     PHYSICAL EXAM  Filed Vitals:   08/22/13 0814  BP: 139/73  Pulse: 74  Weight: 127 lb 6.4 oz (57.788 kg)   Body mass index is 22.57 kg/(m^2). Generalized: In no acute distress  Neck: Supple, no carotid bruits  Cardiac: Regular rate rhythm  Pulmonary: Clear to auscultation bilaterally  Musculoskeletal: No deformity  Neurological examination  Mentation:  oriented to time, place, history taking, and causual conversation  Cranial nerve II-XII:  Pupils were equal round reactive to light extraocular movements were full, visual field were full on confrontational test. facial sensation and strength were normal. hearing was intact to finger rubbing bilaterally. Uvula tongue midline. head turning and shoulder shrug and were normal and symmetric.Tongue protrusion into cheek strength  was normal.  Motor:  bilateral lower extremity spasticity, she has mild left hip flexion, knee flexion, left ankle dorsi flexion weakness  Sensory: Intact to fine touch, pinprick, preserved vibratory sensation, and proprioception at toes.  Coordination: Normal finger to nose, heel-to-shin bilaterally  Gait: wide based, mild circumerferential, dragging her left leg, unsteady gait, no assistive device Romberg signs: Negative  Deep tendon reflexes: Brachioradialis 2/2, biceps 2/2, triceps 2/2, patellar 3/3, Achilles 2/2, plantar responses were flexor bilaterally.  DIAGNOSTIC DATA (LABS, IMAGING, TESTING) - I reviewed patient records, labs, notes, testing and imaging myself where available.  Lab Results  Component Value Date   WBC 4.9 06/03/2013   HGB 12.1 06/03/2013   HCT 36.7 06/03/2013   MCV 89.8 06/03/2013   PLT 242.0 06/03/2013      Component Value Date/Time   NA 137 08/05/2013 0819   K 4.2 08/05/2013 0819   CL 102 08/05/2013 0819   CO2 28 08/05/2013 0819   GLUCOSE 67* 08/05/2013 0819   BUN 11 08/05/2013 0819   CREATININE 0.8 08/05/2013 0819   CREATININE 0.83 04/23/2013 1448   CALCIUM 9.2 08/05/2013 0819   PROT 6.8 08/05/2013 0819   ALBUMIN 3.7 08/05/2013 0819   AST 23 08/05/2013 0819   ALT 12 08/05/2013 0819   ALKPHOS 50 08/05/2013 0819   BILITOT 0.4 08/05/2013 0819   GFRNONAA 86* 01/21/2013 0956   GFRAA >90 01/21/2013 0956    Lab Results  Component Value Date   TSH 0.65 08/05/2013      ASSESSMENT AND PLAN  68 y.o. year old female  has a past medical history of Fibromyalgia; Restless leg syndrome; Multiple sclerosis; Arthritis; Osteoporosis; MS (multiple  sclerosis) (08/15/2012); Headache(784.0); here to followup.  Continue current meds, Cymbalta Trileptal, fentanyl, lidocaine. Try neupro patch given samples of 1mg  to use for 1 week then 2mg   Call for rx to be sent if this works F/U in 6 months Nilda Riggs, [facility], [facility], APRN  [facility] [address] [place], [place] [address] [phone]

## 2013-08-27 ENCOUNTER — Telehealth: Payer: Self-pay | Admitting: Nurse Practitioner

## 2013-08-27 NOTE — Telephone Encounter (Signed)
Patient calling to state that her Neupro samples were helping her with her restless legs but her insurance is not covering much of it, states that a 90 day supply of it will be about $500 so she would like some advise. Patient is also requesting a suggestion for an over the counter medication cream to manage her fibromyalgia, states that she tried gold bond but it doesn't work well and causes her to itch. Please return call and advise.

## 2013-08-27 NOTE — Telephone Encounter (Signed)
Called patient to get more information,left return call message with patient's husband

## 2013-08-27 NOTE — Telephone Encounter (Signed)
I called the patient back.  She will follow up with the pharmacy and call us back if anything further is needed.

## 2013-08-27 NOTE — Telephone Encounter (Signed)
Shanda Bumps are there patient assistance for this

## 2013-08-27 NOTE — Telephone Encounter (Signed)
I called the patient back to provide info for PAP, [phone].  She will call them to provide necessary info and call us back if needed.

## 2013-08-27 NOTE — Telephone Encounter (Signed)
Patient is also requesting recommendations on something over the counter(cream) for her fibromyalgia,tried gold bond but not working

## 2013-08-27 NOTE — Telephone Encounter (Signed)
Patient is on Lidocaine cream, I am not aware of any OTC preparation for this. She can ask the pharmacist. Please call.

## 2013-08-29 ENCOUNTER — Ambulatory Visit: Payer: Medicare Other | Admitting: Nurse Practitioner

## 2013-09-10 ENCOUNTER — Telehealth: Payer: Self-pay | Admitting: Nurse Practitioner

## 2013-09-10 NOTE — Telephone Encounter (Signed)
Patient calling to ask whether it is okay for her to take Tramadol even though she has a Fentanyl patch as well. Please return call to patient and advise.

## 2013-09-10 NOTE — Telephone Encounter (Signed)
I called the patient back.  She said she is not currently taking Tramdol, but used to a long time ago.  Says she decided she wants to stick with Fentanyl at this time and will call us back if anything further is needed.

## 2013-10-07 ENCOUNTER — Telehealth: Payer: Self-pay | Admitting: Nurse Practitioner

## 2013-10-07 DIAGNOSIS — G35 Multiple sclerosis: Secondary | ICD-10-CM

## 2013-10-07 NOTE — Telephone Encounter (Signed)
Dr. Terrace Arabia please see list of meds. I am suggesting she go to a pain clinic for continued management.

## 2013-10-07 NOTE — Telephone Encounter (Signed)
Patient questioning if there's another company besides [employer] for her medication compound.  Also questioning if there's another compound with more numbing medication.  Please call anytime and may leave detailed message on voice mail.

## 2013-10-07 NOTE — Telephone Encounter (Signed)
I called the patient back.  She said she wants something with stronger numbing agent.  She asked if we could prescribe Novocaine.  Advised this is not something we prescribe.   Patient has tried and failed a multitude of meds including: Biofreeze, Menthol Patches (i.e. Icy hot), Ibuprofen, Naproxen, Tylenol, Prednisone, Robaxin, Flexeril, Baclofen, Soma, Klonopin, Cymbalta, Trileptal, Compound Cream (containing: meloxicam 0.5/doxepin 3%/amantadine 3%/dextromethorphan 2%/Lidocaine 5%), Fentanyl, Percocet, Amitriptyline, Darvocet, Butrans, Lyrica, Tylenol with Codeine, Florinef, Hydrocodone, Neurontin, Requip, Requip XL, Neupro and Ultram.  Asking that we "really put our heads together and think of something else she can take for the pain".  Please advise.  Thank you.  (Last seen by CM in July, has follow up with CM in Jan)   Advised she may contact her ins and see what local compound pharmacies are contracted with them if she would like to change pharmacy.  She was agreeable to this.

## 2013-10-09 ENCOUNTER — Telehealth: Payer: Self-pay | Admitting: Nurse Practitioner

## 2013-10-09 NOTE — Telephone Encounter (Signed)
Reviewed, agree plan listed by Eber Jones

## 2013-10-09 NOTE — Telephone Encounter (Signed)
Please see previous note.

## 2013-10-09 NOTE — Telephone Encounter (Signed)
Called and spoke to patient and she will go to Dr.Phillips or Dr.Betha . Explained to patient about Neupro  Will not be covered. Explained to patient we will send referral give the referral a few weeks to process Patient understood.

## 2013-10-09 NOTE — Telephone Encounter (Signed)
Will refer to Dr. Vear Clock

## 2013-10-09 NOTE — Addendum Note (Signed)
Addended by: Beverely Low on: 10/09/2013 02:38 PM   Modules accepted: Orders

## 2013-10-09 NOTE — Telephone Encounter (Signed)
Neupro is not indicated for MS insurance will not cover. She will need pain management referral per Dr. Terrace Arabia

## 2013-10-09 NOTE — Telephone Encounter (Signed)
Called patient back and spoke with her. Patient states I am going to hold off on pain mgt at this time. Patient is wanting a prescription for Neupro 1 mg patches If Eber Jones and Dr.Yan agree please Rx needs to be for thirty days. Patient wants a call back.

## 2013-10-09 NOTE — Telephone Encounter (Signed)
Katrina Rivera Dr. Terrace Arabia wants patient referred to pain management. Ask patient if she has a preference.

## 2013-10-09 NOTE — Telephone Encounter (Signed)
Patient calling to state that she finished her Neupro 1 mg trial and would like a 30 day supply script called in to [facility]. Please call and advise.

## 2013-10-10 ENCOUNTER — Other Ambulatory Visit: Payer: Self-pay | Admitting: Neurology

## 2013-10-10 MED ORDER — FENTANYL 25 MCG/HR TD PT72: 25.0000 ug | MEDICATED_PATCH | TRANSDERMAL | Status: DC

## 2013-10-10 NOTE — Telephone Encounter (Signed)
Patient requesting refill of Fentanyl patches 90 day supply, wants to come and pick up written script, please call and advise.

## 2013-11-06 ENCOUNTER — Other Ambulatory Visit: Payer: Self-pay | Admitting: Endocrinology

## 2013-11-06 ENCOUNTER — Other Ambulatory Visit: Payer: Medicare Other

## 2013-11-06 LAB — BASIC METABOLIC PANEL
BUN: 9 mg/dL (ref 6–23)
CO2: 29 mEq/L (ref 19–32)
Calcium: 8.6 mg/dL (ref 8.4–10.5)
Chloride: 106 mEq/L (ref 96–112)
Creat: 0.85 mg/dL (ref 0.50–1.10)
Glucose, Bld: 47 mg/dL — ABNORMAL LOW (ref 70–99)
POTASSIUM: 4.1 meq/L (ref 3.5–5.3)
SODIUM: 141 meq/L (ref 135–145)

## 2013-11-06 LAB — T4, FREE: FREE T4: 0.78 ng/dL — AB (ref 0.80–1.80)

## 2013-11-08 ENCOUNTER — Encounter: Payer: Self-pay | Admitting: Endocrinology

## 2013-11-08 ENCOUNTER — Ambulatory Visit (INDEPENDENT_AMBULATORY_CARE_PROVIDER_SITE_OTHER): Payer: Medicare Other | Admitting: Endocrinology

## 2013-11-08 VITALS — BP 140/70 | HR 62 | Temp 98.0°F | Resp 14 | Ht 63.0 in | Wt 125.8 lb

## 2013-11-08 DIAGNOSIS — I951 Orthostatic hypotension: Secondary | ICD-10-CM

## 2013-11-08 DIAGNOSIS — G903 Multi-system degeneration of the autonomic nervous system: Secondary | ICD-10-CM

## 2013-11-08 DIAGNOSIS — E162 Hypoglycemia, unspecified: Secondary | ICD-10-CM

## 2013-11-08 DIAGNOSIS — E2740 Unspecified adrenocortical insufficiency: Secondary | ICD-10-CM

## 2013-11-08 DIAGNOSIS — E038 Other specified hypothyroidism: Secondary | ICD-10-CM

## 2013-11-08 DIAGNOSIS — D509 Iron deficiency anemia, unspecified: Secondary | ICD-10-CM

## 2013-11-08 LAB — CORTISOL: Cortisol, Plasma: 3.3 ug/dL

## 2013-11-08 NOTE — Progress Notes (Signed)
Patient ID: Katrina Rivera, female   DOB: [birth year], 68 y.o.   MRN: [id number]   Subjective:     Chief complaint: Followup of multiple problems  PAST history: She has had long-standing problems with orthostatic hypotension and also hyponatremia. Has been diagnosed with dysautonomia and has multiple other problems related to autonomic neuropathy.    She has been on Florinef since 2004 usually taking 3 tablets daily along with 5 tablets of potassium which had previously controlled her symptoms well . She apparently does better with the regimen of one tablet in the morning and 2 in the evening.  Blood pressure tends to get high if she increases Florinef to 4 tablets  Because of persistent orthostatic symptoms she had been tried on midodrine on 05/28/12 but this was later stopped when blood pressure increased.  She  had medication adjustments done frequently over the last year for regulating her blood pressure.  RECENT history: She had been on Florinef 3 tablets a day for some time and now is taking one in the morning and 2 in the evening  She feels fairly good with no lightheadedness when standing up As a result she has not been monitoring her blood pressure at home again, previously had been doing so regularly at home including standing blood pressure readings. 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  Prior testing included Cortrosyn test baseline with stimulated level of 15.7, baseline 3.9.  The response to therapy was Improved symptoms with better appetite, no further weight loss and less frequent nausea. At first did not tolerate hydrocortisone and was given prednisone. Was having abdominal distress with taking prednisone at breakfast and  also had insomnia, stomach pain, nausea and was switched back to hydrocortisone However she stopped her hydrocortisone in 4/15 because of concerns about weight gain and excessive bruising, abdominal discomfort and increased appetite Has not had any recurrence of the above Addisonian symptoms since then, overall feels good without nausea or anorexia and sodium is still normal   Wt Readings from Last 3 Encounters:  11/08/13 125 lb 12.8 oz (57.063 kg)  08/22/13 127 lb 6.4 oz (57.788 kg)  08/08/13 129 lb (58.514 kg)    General Endocrinology:    Pituitary: She has had a low free T4  but did not tolerate thyroxine supplementation and free T4 has been variable Free T4 is now consistently low but she does not feel unusually fatigued; tends to have mild tiredness which is not new. Has no  cold intolerance   LABS:  Orders Only on 11/06/2013  Component Date Value Ref Range Status  . Sodium 11/06/2013 141  135 - 145 mEq/L Final  . Potassium 11/06/2013 4.1  3.5 - 5.3 mEq/L Final  . Chloride 11/06/2013 106  96 - 112 mEq/L Final  . CO2 11/06/2013 29  19 - 32 mEq/L Final  . Glucose, Bld 11/06/2013 47* 70 - 99 mg/dL Final  . BUN 09/38/1829 9  6 - 23 mg/dL Final  . Creat 93/71/6967 0.85  0.50 - 1.10 mg/dL Final  . Calcium 89/38/1017 8.6  8.4 - 10.5 mg/dL Final  . Free T4 51/03/5850 0.78* 0.80 - 1.80 ng/dL Final          Medication List       This list is  accurate as of: 11/08/13  8:58 AM.  Always use your most recent med list.               ALPRAZolam 0.5 MG tablet  Commonly known as:  XANAX  2 tablets at bedtime.     bifidobacterium infantis capsule  Take 1 capsule by mouth daily.     calcium-vitamin D 500-200 MG-UNIT per tablet  Commonly known as:  OSCAL WITH D  Take 1 tablet by mouth 2 (two) times daily.     cetirizine 10 MG tablet  Commonly known as:  ZYRTEC  Take 10 mg by mouth daily.     DULoxetine 30 MG capsule  Commonly known as:  CYMBALTA  Take 1 capsule (30 mg  total) by mouth daily.     estradiol 0.025 mg/24hr patch  Commonly known as:  CLIMARA - Dosed in mg/24 hr     fentaNYL 25 MCG/HR patch  Commonly known as:  DURAGESIC - dosed mcg/hr  Place 1 patch (25 mcg total) onto the skin every 3 (three) days.     Ferrous Sulfate Dried 200 (65 FE) MG Tabs  Take 65 mg by mouth daily.     Fish Oil 1200 MG Caps  Take 2 capsules by mouth daily.     fludrocortisone 0.1 MG tablet  Commonly known as:  FLORINEF  One tablet in the morning and two tablets at night.     fluticasone 50 MCG/ACT nasal spray  Commonly known as:  FLONASE  Place 2 sprays into both nostrils at bedtime as needed for allergies or rhinitis.     HYDROcodone-acetaminophen 5-325 MG per tablet  Commonly known as:  NORCO/VICODIN     ibuprofen 200 MG tablet  Commonly known as:  ADVIL,MOTRIN  Take 400 mg by mouth 2 (two) times daily as needed (pain).     KLOR-CON 10 10 MEQ tablet  Generic drug:  potassium chloride  Taking 3 in the am and 2 in the pm.     lansoprazole 30 MG capsule  Commonly known as:  PREVACID  Take 30 mg by mouth daily before breakfast.     meloxicam 7.5 MG tablet  Commonly known as:  MOBIC     multivitamin with minerals Tabs tablet  Take 1 tablet by mouth daily.     omega-3 acid ethyl esters 1 G capsule  Commonly known as:  LOVAZA  Take 1 g by mouth 2 (two) times daily.     omeprazole 40 MG capsule  Commonly known as:  PRILOSEC     Oxcarbazepine 300 MG tablet  Commonly known as:  TRILEPTAL  Take 150 mg by mouth 2 (two) times daily.     potassium chloride 10 MEQ tablet  Commonly known as:  K-DUR,KLOR-CON  Take 2-3 tablets (20-30 mEq total) by mouth 2 (two) times daily. Take 3 in the morning and two at bedtime.     PRESCRIPTION MEDICATION  Apply 1 application topically at bedtime. meloxicam 0.5/doxepin 3%/amantadine 3%/dextromethorphan 2%/Lidocaine 2%. Compounded prescription.  Apply at 8pm and repeat at 10pm if needed.     Rotigotine 1 MG/24HR  Pt24  Commonly known as:  NEUPRO  Place 1 patch (1 mg total) onto the skin daily. 1 mg patch daily for 1 week then increase to 2mg  patch     SYSTANE OP  Apply 2 drops to eye daily as needed (for dry eyes).     tamsulosin 0.4 MG Caps capsule  Commonly known as:  FLOMAX  Take 0.4  mg by mouth daily after breakfast.     traZODone 100 MG tablet  Commonly known as:  DESYREL     trimethoprim 100 MG tablet  Commonly known as:  TRIMPEX  1 tablet at bedtime.        Review of Systems    Increased sweating: Has had hot flashes and periodic sweating in the daytime  Did not tolerate propantheline. Had been slightly better with Climara 0.1 mg and subsequently  taking Vivelle  Stopped this because of cost. Effexor was also tried but she did not tolerate this and also sweating was no better She was also tried on clonidine patches without much relief She was retried on hormone patches but she stopped this because he felt it was causing drowsiness She does not complain of excessive symptoms now and she is trying different treatments for her heat intolerance which apparently has been related to her multiple sclerosis  Genitourinary: Positive for difficulty urinating.       She was taking urecholine for her  neurogenic bladder  but stopped this because of excessive salivation Neurological: Positive for numbness.       Has peripheral neuropathy, symptomatic of unclear etiology and followed by neurologist  She has had significant amount of neuropathic pain in her feet and legs and was not relieved with large doses of gabapentin. She is on Cymbalta and Trileptal and also local lidocaine   She has been anemic and was given iron by PCP.    Low glucose: Her lab glucose was 47 but she was asymptomatic at that time, she thinks she had a granola bar before coming. May be lab error     Objective:   Physical Exam  BP 122/70  Pulse 62  Temp(Src) 98 F (36.7 C)  Resp 14  Ht 5\' 3"  (1.6 m)  Wt 125 lb  12.8 oz (57.063 kg)  BMI 22.29 kg/m2  SpO2 95%  No pallor, overall looks fairly good  Repeat blood pressure  Standing is 140/70  No pedal edema  Assessment:      Orthostatic dysautonomic hypotension syndrome:   Her symptoms are controlled with Florinef 3 tablets a day  Electrolytes are normal He has no orthostasis today and has had stable blood pressure readings recently Recommended monitoring blood pressure periodically at home also  Adrenal insufficiency: She has no symptoms at this time despite stopping off her hydrocortisone earlier this year Will recheck her cortisol since she still complains of fatigue and also her glucose was unexpectedly low on the lab  HYPOKALEMIA: Controlled,   this is likely from high dose Florinef and is  controlled on  potassium supplements  Sweating episodes: She is tolerating symptoms now   ? Secondary hypothyroidism: Free T4 is low but she is minimally symptomatic and reluctant to try levothyroxine supplement which she did not tolerate before; will continue to monitor  Plan:       As above   Followup in 3 months     Myya Meenach

## 2013-11-11 ENCOUNTER — Other Ambulatory Visit: Payer: Self-pay | Admitting: *Deleted

## 2013-11-11 MED ORDER — POTASSIUM CHLORIDE CRYS ER 10 MEQ PO TBCR: EXTENDED_RELEASE_TABLET | ORAL | Status: DC

## 2013-12-02 ENCOUNTER — Other Ambulatory Visit: Payer: Self-pay | Admitting: *Deleted

## 2013-12-02 ENCOUNTER — Telehealth: Payer: Self-pay | Admitting: Endocrinology

## 2013-12-02 MED ORDER — POTASSIUM CHLORIDE ER 10 MEQ PO TBCR: 10.0000 meq | EXTENDED_RELEASE_TABLET | Freq: Two times a day (BID) | ORAL | Status: DC

## 2013-12-02 NOTE — Telephone Encounter (Signed)
rx sent

## 2013-12-02 NOTE — Telephone Encounter (Signed)
Please call in refill for klor con for 90 days supply she has no refills to [facility]

## 2013-12-04 ENCOUNTER — Other Ambulatory Visit: Payer: Self-pay | Admitting: Neurological Surgery

## 2013-12-04 DIAGNOSIS — M5416 Radiculopathy, lumbar region: Secondary | ICD-10-CM

## 2013-12-06 ENCOUNTER — Ambulatory Visit
Admission: RE | Admit: 2013-12-06 | Discharge: 2013-12-06 | Disposition: A | Discharge status: Home / Self Care | Payer: Medicare Other | Source: Ambulatory Visit | Attending: Neurological Surgery | Admitting: Neurological Surgery

## 2013-12-06 DIAGNOSIS — M5416 Radiculopathy, lumbar region: Secondary | ICD-10-CM

## 2013-12-06 MED ORDER — IOHEXOL 180 MG/ML  SOLN
1.0000 mL | Freq: Once | INTRAMUSCULAR | Status: AC | PRN
  Administered 2013-12-06: 1 mL via EPIDURAL

## 2013-12-06 MED ORDER — METHYLPREDNISOLONE ACETATE 40 MG/ML INJ SUSP (RADIOLOG
120.0000 mg | Freq: Once | INTRAMUSCULAR | Status: AC
  Administered 2013-12-06: 120 mg via EPIDURAL

## 2013-12-06 NOTE — Discharge Instructions (Signed)

## 2013-12-13 ENCOUNTER — Telehealth: Payer: Self-pay | Admitting: Endocrinology

## 2013-12-16 ENCOUNTER — Other Ambulatory Visit: Payer: Self-pay | Admitting: Gastroenterology

## 2013-12-24 ENCOUNTER — Other Ambulatory Visit: Payer: Self-pay | Admitting: *Deleted

## 2013-12-24 MED ORDER — POTASSIUM CHLORIDE CRYS ER 10 MEQ PO TBCR: EXTENDED_RELEASE_TABLET | ORAL | Status: DC

## 2013-12-27 ENCOUNTER — Other Ambulatory Visit: Payer: Self-pay | Admitting: Gastroenterology

## 2013-12-27 DIAGNOSIS — R1084 Generalized abdominal pain: Secondary | ICD-10-CM

## 2013-12-31 ENCOUNTER — Other Ambulatory Visit: Payer: Self-pay | Admitting: Neurological Surgery

## 2013-12-31 DIAGNOSIS — M5417 Radiculopathy, lumbosacral region: Secondary | ICD-10-CM

## 2014-01-01 ENCOUNTER — Ambulatory Visit
Admission: RE | Admit: 2014-01-01 | Discharge: 2014-01-01 | Disposition: A | Discharge status: Home / Self Care | Payer: Medicare Other | Source: Ambulatory Visit | Attending: Gastroenterology | Admitting: Gastroenterology

## 2014-01-01 DIAGNOSIS — R1084 Generalized abdominal pain: Secondary | ICD-10-CM

## 2014-01-01 MED ORDER — IOHEXOL 300 MG/ML  SOLN
100.0000 mL | Freq: Once | INTRAMUSCULAR | Status: AC | PRN
  Administered 2014-01-01: 100 mL via INTRAVENOUS

## 2014-01-07 ENCOUNTER — Ambulatory Visit
Admission: RE | Admit: 2014-01-07 | Discharge: 2014-01-07 | Disposition: A | Discharge status: Home / Self Care | Payer: Medicare Other | Source: Ambulatory Visit | Attending: Neurological Surgery | Admitting: Neurological Surgery

## 2014-01-07 DIAGNOSIS — M5417 Radiculopathy, lumbosacral region: Secondary | ICD-10-CM

## 2014-01-07 MED ORDER — IOHEXOL 180 MG/ML  SOLN
1.0000 mL | Freq: Once | INTRAMUSCULAR | Status: AC | PRN
  Administered 2014-01-07: 1 mL via EPIDURAL

## 2014-01-07 MED ORDER — METHYLPREDNISOLONE ACETATE 40 MG/ML INJ SUSP (RADIOLOG
120.0000 mg | Freq: Once | INTRAMUSCULAR | Status: AC
  Administered 2014-01-07: 120 mg via EPIDURAL

## 2014-01-09 NOTE — Progress Notes (Signed)
Patient called to report having continued numbness and weakness in her left leg after her second left L5-S1 SNRB two days ago.  At the time of the injection then, she did experience significant weakness and numbness and was brought to the nursing station in a wheelchair.  She was wheeled to her car, as well, but was able to step from wheelchair to car without difficulty.  Today she also voiced concern that she feels she should be feeling better by now from the injection.  After consulting with Dr. Mosetta Putt, I called patient back and reminded her it takes 3-5 days (maybe up to a week) for the steroid to be absorbed and for her symptoms to resolve.  I explained that Dr. Mosetta Putt feels the steroid solution may have irritated the nerve; that perhaps Dr. Alfredo Batty got this injection a bit closed to the nerve than on her first injection.  Encouraged her to give the injection another few days to "do its thing" and to call us on Monday (01/13/14) if the weakness and numbness is no better.  jkl

## 2014-01-13 ENCOUNTER — Ambulatory Visit
Admission: RE | Admit: 2014-01-13 | Discharge: 2014-01-13 | Disposition: A | Discharge status: Home / Self Care | Payer: Medicare Other | Source: Ambulatory Visit | Attending: Gastroenterology | Admitting: Gastroenterology

## 2014-01-13 ENCOUNTER — Other Ambulatory Visit: Payer: Self-pay | Admitting: Gastroenterology

## 2014-01-13 DIAGNOSIS — R140 Abdominal distension (gaseous): Secondary | ICD-10-CM

## 2014-01-13 DIAGNOSIS — R197 Diarrhea, unspecified: Secondary | ICD-10-CM

## 2014-01-14 ENCOUNTER — Telehealth: Payer: Self-pay | Admitting: Radiology

## 2014-01-14 NOTE — Telephone Encounter (Signed)
Pt still having headaches that are non-positional. Still has increased pain in her leg and is not getting better. Explained headache could be side effect of the steroid and that unfortunately not all people get better with the injection and a few sometimes get worse. I asked her to give it till next Monday and if not better follow up with Dr. Yetta Barre and see what he wants her to try next.

## 2014-01-15 ENCOUNTER — Telehealth: Payer: Self-pay | Admitting: Neurology

## 2014-01-15 MED ORDER — FENTANYL 50 MCG/HR TD PT72: 25.0000 ug | MEDICATED_PATCH | TRANSDERMAL | Status: DC

## 2014-01-15 NOTE — Telephone Encounter (Signed)
I called back.  Spoke with the patient.  Says she has decided she does not wish to go to the pain clinic because she has heard too many negative stories about them.  States she is now having increased back pain and would like to know if Dr Terrace Arabia will increase Fentanyl dose from to .  Please advise.  Thank you.

## 2014-01-15 NOTE — Telephone Encounter (Signed)
I called back.  Spoke with the patient.  She is aware Rx has been updated and will be certain to be here for appt in Jan.  She is aware we will call her when written Rx has been signed and is ready for pick up.

## 2014-01-15 NOTE — Telephone Encounter (Signed)
Patient is calling because she is now taking Fentanyl 25mg  transdermal patch and would like to increase to Fentanyl 50mg  transdermal patch for pain. Patient states this is a controlled substance and will need a written Rx. Please call patient and advise. It is ok to leave a message. Thank you.

## 2014-01-15 NOTE — Telephone Encounter (Signed)
I have reviewed the chart, Katrina Rivera, please let patients now, I have told fentanyl patch 50 g every 72 hours, she should keep her follow-up appointment in January 2016

## 2014-01-16 NOTE — Telephone Encounter (Signed)
Patient showed up at the office to pick up her Rx of Fentanyl Patch without being called.  Rx was taken to the front desk for processing.

## 2014-01-23 ENCOUNTER — Telehealth: Payer: Self-pay | Admitting: Neurology

## 2014-01-23 MED ORDER — FENTANYL 50 MCG/HR TD PT72: 50.0000 ug | MEDICATED_PATCH | TRANSDERMAL | Status: DC

## 2014-01-23 NOTE — Telephone Encounter (Signed)
Pt is calling to request a written Rx for fentaNYL (DURAGESIC - DOSED MCG/HR) 50 MCG/HR.  She states she needs 10 days worth due to the holidays and her next appointment.  Please call and advise.

## 2014-01-23 NOTE — Telephone Encounter (Signed)
I called the patient back.  She is aware Rx will be written for #6 and we will notify her once it has been signed and is ready for pick up.

## 2014-01-23 NOTE — Telephone Encounter (Signed)
Katrina Rivera, please let patient know, I have written 6 patch of Fentanyl patch q72 hours.

## 2014-01-23 NOTE — Telephone Encounter (Signed)
I called back.  Patient would like another Rx written for Fentanyl.  She is going out of town for the holiday, and previous Rx was written for 5 patches on 12/09, which is a 2 week supply.  She is requesting a new Rx for 10 patches to pick up prior to leaving.  She will not get it filled early, just needs it to take with her, because she will be due on Dec 24.  She will be sure to keep appt in Jan.  Please advise.  Thank you.

## 2014-01-27 NOTE — Telephone Encounter (Signed)
I called the patient to let them know their Rx for Fentanyl Patch was ready for pickup. Patient was instructed to bring Photo ID.  Patient is upset that can't get more than #6.

## 2014-02-05 NOTE — Telephone Encounter (Signed)
error 

## 2014-02-10 ENCOUNTER — Other Ambulatory Visit: Payer: Self-pay | Admitting: Neurology

## 2014-02-10 ENCOUNTER — Other Ambulatory Visit: Payer: Medicare Other

## 2014-02-10 ENCOUNTER — Other Ambulatory Visit (INDEPENDENT_AMBULATORY_CARE_PROVIDER_SITE_OTHER): Payer: PPO

## 2014-02-10 DIAGNOSIS — E2740 Unspecified adrenocortical insufficiency: Secondary | ICD-10-CM

## 2014-02-10 DIAGNOSIS — E038 Other specified hypothyroidism: Secondary | ICD-10-CM

## 2014-02-10 DIAGNOSIS — D509 Iron deficiency anemia, unspecified: Secondary | ICD-10-CM

## 2014-02-10 LAB — COMPREHENSIVE METABOLIC PANEL
ALT: 14 U/L (ref 0–35)
AST: 17 U/L (ref 0–37)
Albumin: 4 g/dL (ref 3.5–5.2)
Alkaline Phosphatase: 37 U/L — ABNORMAL LOW (ref 39–117)
BILIRUBIN TOTAL: 0.3 mg/dL (ref 0.2–1.2)
BUN: 15 mg/dL (ref 6–23)
CALCIUM: 9 mg/dL (ref 8.4–10.5)
CO2: 30 mEq/L (ref 19–32)
Chloride: 104 mEq/L (ref 96–112)
Creatinine, Ser: 0.6 mg/dL (ref 0.4–1.2)
GFR: 98.56 mL/min (ref >60.00)
Glucose, Bld: 69 mg/dL — ABNORMAL LOW (ref 70–99)
Potassium: 4.6 mEq/L (ref 3.5–5.1)
SODIUM: 141 meq/L (ref 135–145)
TOTAL PROTEIN: 6.5 g/dL (ref 6.0–8.3)

## 2014-02-10 LAB — CBC
HCT: 39.4 % (ref 36.0–46.0)
Hemoglobin: 12.8 g/dL (ref 12.0–15.0)
MCHC: 32.5 g/dL (ref 30.0–36.0)
MCV: 92 fl (ref 78.0–100.0)
PLATELETS: 210 10*3/uL (ref 150.0–400.0)
RBC: 4.28 Mil/uL (ref 3.87–5.11)
RDW: 15.1 % (ref 11.5–15.5)
WBC: 6.6 10*3/uL (ref 4.0–10.5)

## 2014-02-10 LAB — TSH: TSH: 1.6 u[IU]/mL (ref 0.35–4.50)

## 2014-02-10 LAB — T4, FREE: FREE T4: 0.66 ng/dL (ref 0.60–1.60)

## 2014-02-10 LAB — CORTISOL: CORTISOL PLASMA: 0.3 ug/dL

## 2014-02-10 MED ORDER — FENTANYL 50 MCG/HR TD PT72: 50.0000 ug | MEDICATED_PATCH | TRANSDERMAL | Status: DC

## 2014-02-10 NOTE — Telephone Encounter (Signed)
Rx was last written for #6, which is an 18 day supply.  Patient requests a Rx for #10, for 30 day supply so she does not have multiple co-pays.   Request entered, forwarded to provider for approval.

## 2014-02-10 NOTE — Telephone Encounter (Signed)
Patient requesting Rx refill for fentaNYL (DURAGESIC - DOSED MCG/HR) 50 MCG/HR.  Please call and advise.

## 2014-02-13 ENCOUNTER — Other Ambulatory Visit: Payer: Self-pay | Admitting: *Deleted

## 2014-02-13 ENCOUNTER — Encounter: Payer: Self-pay | Admitting: Endocrinology

## 2014-02-13 ENCOUNTER — Ambulatory Visit (INDEPENDENT_AMBULATORY_CARE_PROVIDER_SITE_OTHER): Payer: PPO | Admitting: Endocrinology

## 2014-02-13 VITALS — BP 128/72 | HR 82 | Temp 97.8°F | Resp 14 | Ht 63.0 in | Wt 117.8 lb

## 2014-02-13 DIAGNOSIS — E162 Hypoglycemia, unspecified: Secondary | ICD-10-CM

## 2014-02-13 DIAGNOSIS — I951 Orthostatic hypotension: Secondary | ICD-10-CM

## 2014-02-13 DIAGNOSIS — G903 Multi-system degeneration of the autonomic nervous system: Secondary | ICD-10-CM

## 2014-02-13 DIAGNOSIS — E2740 Unspecified adrenocortical insufficiency: Secondary | ICD-10-CM

## 2014-02-13 DIAGNOSIS — R634 Abnormal weight loss: Secondary | ICD-10-CM

## 2014-02-13 MED ORDER — POTASSIUM CHLORIDE ER 10 MEQ PO TBCR: 10.0000 meq | EXTENDED_RELEASE_TABLET | Freq: Two times a day (BID) | ORAL | Status: DC

## 2014-02-13 MED ORDER — HYDROCORTISONE 5 MG PO TABS: 10.0000 mg | ORAL_TABLET | Freq: Every day | ORAL | Status: DC

## 2014-02-13 MED ORDER — FLUDROCORTISONE ACETATE 0.1 MG PO TABS: ORAL_TABLET | ORAL | Status: DC

## 2014-02-13 MED ORDER — ONDANSETRON HCL 4 MG PO TABS: 4.0000 mg | ORAL_TABLET | Freq: Three times a day (TID) | ORAL | Status: DC | PRN

## 2014-02-13 NOTE — Progress Notes (Signed)
Patient ID: Katrina Rivera, female   DOB: 1946/02/08, 69 y.o.   MRN: [id number]   Subjective:     Chief complaint: Followup of multiple problems  PAST history: She has had long-standing problems with orthostatic hypotension and also hyponatremia. Has been diagnosed with dysautonomia and has multiple other problems related to autonomic neuropathy.    She has been on Florinef since 2004 usually taking 3 tablets daily along with 5 tablets of potassium which had previously controlled her symptoms well . She apparently does better with the regimen of one tablet in the morning and 2 in the evening.  Blood pressure tends to get high if she increases Florinef to 4 tablets  Because of persistent orthostatic symptoms she had been tried on midodrine on 05/28/12 but this was later stopped when blood pressure increased.  She  had medication adjustments done frequently over the last year for regulating her blood pressure.  RECENT history: She had been on Florinef 3 tablets a day consistently now and as before is taking one in the morning and 2 in the evening  She feels fairly good with no lightheadedness when standing up Despite reminders she has not been monitoring her blood pressure at home again, previously had been doing so regularly at home including standing blood pressure readings.   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  Prior testing included Cortrosyn test baseline with stimulated level of 15.7, baseline 3.9.  The response to therapy was Improved symptoms with better appetite, no further weight loss and less frequent nausea. At first did not tolerate hydrocortisone and was given prednisone. Was having abdominal distress with taking prednisone at breakfast and also had  insomnia, stomach pain, nausea and was switched back to hydrocortisone She stopped her hydrocortisone in 4/15 because of concerns about weight gain and excessive bruising, abdominal discomfort and increased appetite  More recently she has lost about 8 pounds in the last month or 2.  Also has had decreased appetite, occasionally having nausea and feels tired. Also has had more problems with loose stools Does not feel weak. Her cortisol level in the morning is only 0.3 now  Wt Readings from Last 3 Encounters:  02/13/14 117 lb 12.8 oz (53.434 kg)  11/08/13 125 lb 12.8 oz (57.063 kg)  08/22/13 127 lb 6.4 oz (57.788 kg)    General Endocrinology:    Pituitary: She has had a low free T4  but did not tolerate thyroxine supplementation and free T4 has been variable Free T4 is consistently normal now  She tends to have  tiredness which is not new. Has no  cold intolerance   LABS:  Appointment on 02/10/2014  Component Date Value Ref Range Status  . Cortisol, Plasma 02/10/2014 0.3   Final   AM:  4.3 - 22.4 ug/dLPM:  3.1 - 16.7 ug/dL  . Sodium 02/10/2014 141  135 - 145 mEq/L Final  . Potassium 02/10/2014 4.6  3.5 - 5.1 mEq/L Final  . Chloride 02/10/2014 104  96 - 112 mEq/L Final  . CO2 02/10/2014 30  19 - 32 mEq/L Final  . Glucose, Bld 02/10/2014 69* 70 - 99 mg/dL Final  . BUN 78/29/5621 15  6 - 23 mg/dL Final  . Creatinine, Ser 02/10/2014 0.6  0.4 - 1.2 mg/dL Final  .  Total Bilirubin 02/10/2014 0.3  0.2 - 1.2 mg/dL Final  . Alkaline Phosphatase 02/10/2014 37* 39 - 117 U/L Final  . AST 02/10/2014 17  0 - 37 U/L Final  . ALT 02/10/2014 14  0 - 35 U/L Final  . Total Protein 02/10/2014 6.5  6.0 - 8.3 g/dL Final  . Albumin 16/11/9602 4.0  3.5 - 5.2 g/dL Final  . Calcium 54/10/8117 9.0  8.4 - 10.5 mg/dL Final  . GFR 14/78/2956 98.56  >60.00 mL/min Final  . WBC 02/10/2014 6.6  4.0 - 10.5 K/uL Final  . RBC 02/10/2014 4.28  3.87 - 5.11 Mil/uL Final  . Platelets 02/10/2014 210.0  150.0 - 400.0  K/uL Final  . Hemoglobin 02/10/2014 12.8  12.0 - 15.0 g/dL Final  . HCT 21/30/8657 39.4  36.0 - 46.0 % Final  . MCV 02/10/2014 92.0  78.0 - 100.0 fl Final  . MCHC 02/10/2014 32.5  30.0 - 36.0 g/dL Final  . RDW 84/69/6295 15.1  11.5 - 15.5 % Final  . TSH 02/10/2014 1.60  0.35 - 4.50 uIU/mL Final  . Free T4 02/10/2014 0.66  0.60 - 1.60 ng/dL Final          Medication List       This list is accurate as of: 02/13/14 10:04 AM.  Always use your most recent med list.               ALPRAZolam 0.5 MG tablet  Commonly known as:  XANAX  2 tablets at bedtime.     bifidobacterium infantis capsule  Take 1 capsule by mouth daily.     calcium-vitamin D 500-200 MG-UNIT per tablet  Commonly known as:  OSCAL WITH D  Take 1 tablet by mouth 2 (two) times daily.     cetirizine 10 MG tablet  Commonly known as:  ZYRTEC  Take 10 mg by mouth daily.     docusate sodium 100 MG capsule  Commonly known as:  COLACE  Take 100 mg by mouth 2 (two) times daily.     DULoxetine 30 MG capsule  Commonly known as:  CYMBALTA  Take 1 capsule (30 mg total) by mouth daily.     estradiol 0.025 mg/24hr patch  Commonly known as:  CLIMARA - Dosed in mg/24 hr     fentaNYL 50 MCG/HR  Commonly known as:  DURAGESIC - dosed mcg/hr  Place 1 patch (50 mcg total) onto the skin every 3 (three) days.     Ferrous Sulfate Dried 200 (65 FE) MG Tabs  Take 65 mg by mouth daily.     Fish Oil 1200 MG Caps  Take 2 capsules by mouth daily.     fludrocortisone 0.1 MG tablet  Commonly known as:  FLORINEF  One tablet in the morning and two tablets at night.     fluticasone 50 MCG/ACT nasal spray  Commonly known as:  FLONASE  Place 2 sprays into both nostrils at bedtime as needed for allergies or rhinitis.     HYDROcodone-acetaminophen 5-325 MG per tablet  Commonly known as:  NORCO/VICODIN     ibuprofen 200 MG tablet  Commonly known as:  ADVIL,MOTRIN  Take 400 mg by mouth 2 (two) times daily as needed (pain).      meloxicam 7.5 MG tablet  Commonly known as:  MOBIC     multivitamin with minerals Tabs tablet  Take 1 tablet by mouth daily.     omeprazole 40 MG capsule  Commonly known as:  PRILOSEC  Oxcarbazepine 300 MG tablet  Commonly known as:  TRILEPTAL  Take 150 mg by mouth 2 (two) times daily.     potassium chloride 10 MEQ tablet  Commonly known as:  KLOR-CON 10  Take 1 tablet (10 mEq total) by mouth 2 (two) times daily. Taking 3 in the am and 2 in the pm.     potassium chloride 10 MEQ tablet  Commonly known as:  K-DUR,KLOR-CON  Take 3 in the morning and two at bedtime.     PRESCRIPTION MEDICATION  Apply 1 application topically at bedtime. meloxicam 0.5/doxepin 3%/amantadine 3%/dextromethorphan 2%/Lidocaine 2%. Compounded prescription.  Apply at 8pm and repeat at 10pm if needed.     Rotigotine 1 MG/24HR Pt24  Commonly known as:  NEUPRO  Place 1 patch (1 mg total) onto the skin daily. 1 mg patch daily for 1 week then increase to 2mg  patch     SYSTANE OP  Apply 2 drops to eye daily as needed (for dry eyes).     tamsulosin 0.4 MG Caps capsule  Commonly known as:  FLOMAX  Take 0.4 mg by mouth daily after breakfast.     traZODone 100 MG tablet  Commonly known as:  DESYREL     trimethoprim 100 MG tablet  Commonly known as:  TRIMPEX  1 tablet at bedtime.        Review of Systems   Gets constipated and also diarrhea at times  Increased sweating: Has had hot flashes and periodic sweating in the daytime  Did not tolerate propantheline. Had been slightly better with Climara 0.1 mg and subsequently  taking Vivelle which she stopped because of cost. Effexor was also tried but she did not tolerate this and also sweating was no better She was also tried on clonidine patches without much relief She was retried on hormone patches but she stopped this because he felt it was causing drowsiness She does not complain of excessive symptoms now  Genitourinary: Positive for difficulty  urinating.       She was taking urecholine for her  neurogenic bladder  but stopped this because of excessive salivation Neurological: Positive for numbness.       Has peripheral neuropathy, symptomatic of unclear etiology and followed by neurologist  She has had significant amount of neuropathic pain in her feet and legs and was not relieved with large doses of gabapentin. She is on Cymbalta and Trileptal and also local lidocaine   She has been anemic and is followed by PCP        Objective:   Physical Exam  BP 135/82 mmHg  Pulse 82  Temp(Src) 97.8 F (36.6 C)  Resp 14  Ht 5\' 3"  (1.6 m)  Wt 117 lb 12.8 oz (53.434 kg)  BMI 20.87 kg/m2  SpO2 95%  No pallor  No skin pigmentation  Repeat blood pressure standing is 128/72  No pedal edema  Assessment:      Orthostatic dysautonomic hypotension syndrome:   Her symptoms are controlled with Florinef 3 tablets a day  Electrolytes are normal He has no orthostasis today  Recommended monitoring blood pressure periodically at home also  Adrenal insufficiency: She has started having some weight loss, anorexia and mild nausea as well as diarrhea Also has had low normal glucose levels likely to be from adrenal insufficiency Although she attributes this to her long-standing GI problems she does have very low cortisol level and this was explained to her She had been reluctant to continue taking steroid supplements before  because of GI side effects and reported weight gain with higher doses. She can keep some Zofran on hand in case she has nausea    HYPOKALEMIA: Controlled,   this is likely from high dose Florinef and since her potassium is 4.6 will reduce her dose to 4 tablets a day She prefers brand name and will send a new prescription  Sweating episodes: She is tolerating symptoms now and does not want treatment  ? Secondary hypothyroidism: Free T4 is now quite normal.  We'll continue to monitor periodically  Plan:       Discussed need for cortisol supplementation for secondary adrenal insufficiency She will start with 5 mg of hydrocortisone with her midday meal as she does not eat much breakfast and if she can tolerate this she can go up to 10 mg at least  Followup in 4 weeks    Nilan Iddings

## 2014-02-13 NOTE — Patient Instructions (Addendum)
Hydrocortisone  at lunch and after 1 week go to 2 tabs at lunch if tolerated  4 potassium daily

## 2014-02-13 NOTE — Telephone Encounter (Signed)
I called the patient to let them know their Rx for Fentanyl Patch was ready for pickup. Patient was instructed to bring Photo ID.

## 2014-02-18 ENCOUNTER — Telehealth: Payer: Self-pay | Admitting: Endocrinology

## 2014-02-18 NOTE — Telephone Encounter (Signed)
Take the 5 mg hydrocortisone with a full meal at breakfast

## 2014-02-18 NOTE — Telephone Encounter (Signed)
Please see below and advise.

## 2014-02-18 NOTE — Telephone Encounter (Signed)
Last Thursday saw Dr. Lucianne Muss and was prescribed hydrocortisone Affecting her sleep 1 hour sleep per night  Patient is currently taking trazadone for sleep   Please advise   Thank You

## 2014-02-18 NOTE — Telephone Encounter (Signed)
Noted, patient is aware. 

## 2014-02-24 ENCOUNTER — Ambulatory Visit: Payer: Medicare Other | Admitting: Nurse Practitioner

## 2014-02-27 ENCOUNTER — Ambulatory Visit (INDEPENDENT_AMBULATORY_CARE_PROVIDER_SITE_OTHER): Payer: PPO | Admitting: Neurology

## 2014-02-27 ENCOUNTER — Encounter: Payer: Self-pay | Admitting: Neurology

## 2014-02-27 VITALS — BP 158/85 | HR 80 | Ht 60.0 in | Wt 121.0 lb

## 2014-02-27 DIAGNOSIS — G894 Chronic pain syndrome: Secondary | ICD-10-CM

## 2014-02-27 DIAGNOSIS — G35 Multiple sclerosis: Secondary | ICD-10-CM

## 2014-02-27 DIAGNOSIS — R269 Unspecified abnormalities of gait and mobility: Secondary | ICD-10-CM

## 2014-02-27 MED ORDER — DULOXETINE HCL 30 MG PO CPEP: 30.0000 mg | ORAL_CAPSULE | Freq: Every day | ORAL | Status: DC

## 2014-02-27 MED ORDER — FENTANYL 50 MCG/HR TD PT72: 50.0000 ug | MEDICATED_PATCH | TRANSDERMAL | Status: DC

## 2014-02-27 MED ORDER — OXCARBAZEPINE 150 MG PO TABS: 150.0000 mg | ORAL_TABLET | Freq: Two times a day (BID) | ORAL | Status: DC

## 2014-02-27 NOTE — Progress Notes (Signed)
[facility]  PATIENT: Katrina Rivera DOB: 15-Oct-1945   REASON FOR VISIT: follow up for MS, abnormal gait, neuropathic pain, RLS  HISTORY OF PRESENT ILLNESS:Katrina Rivera is a very pleasant 69 year-old right-handed woman, with relapsing remitting multiple sclerosis, was treated with Betaseron 4 -5 years, but could not tolerate the side effect, now is not on any immunomodulation therapy. neuropathic pain of bilateral lower extremities.   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  She has baseline gait difficulty, she fell in May 2013, fracture in both elbows and her left kneecap.  She intermittently has been using a cane or walker.  She could not tolerate Requip because of nausea. She has been on gabapentin 300 mg 3 bid. She gained significant weight when on Lyrica, and therefore was switched back to gabapentin. She has orthostatic hypotension, and is on midodrine 2.5 mg and fludrocortisone 0.1 mg 1 in AM and 2 at night, and she is also on 20 mg of hydrocortisone.  She complains of bilateral lower extremity burning stinging sensation, getting worse when she sits still, difficulty falling into sleep, she has the urge to move because of bilateral extremity discomfort, has tried Requip, could not tolerate it because of GI side effects, Neuprol patch does not work either, she also tried Elavil, Cymbalta in the past, could not tolerate it due to side effect.  Jan 01/2014:  She came in urgently  for acute worsening of her generalized condition since her lumbar decompression surgery in January 24 2013 by Dr. Marikay Alar, prior to surgery, she suffered left-sided low back pain, radiating pain to her left lower extremity, left is much worse than her right side, there was left L5-S1 extraforaminal herniated nucleus pulposus with left L5 radiculopathy she had left L5-S1 extraforaminal microdiscectomy utilizing microscopic dissection.  She had overnight stay at the hospital the day after surgery, she fell when trying to get up using bathroom, landed on her left side, ever since surgery, her lower back pain, radiating pain to her left leg has much improved, but she complained of worsening bilateral lower extremity deep achy pain, worsening gait difficulty, she has not used walker for 3 years, now she began to use her walker, she felt her skin is so tight at bilateral lower extremity, left leg swelling, also has difficulty swallowing, blurred vision, worsening fatigue, difficulty concentration, she has not had MRI evaluation for her multiple sclerosis for many years, is not on any immunomodulation therapy  UPDATE Mar 01, 2013:  She came in with a list of complains, much worse compared to presurgical level. She complains of blurred vision, difficulty sleeping, difficulty concentrating, dizziness, worsening gait difficulty, The most bothersome symptoms are bilateral lower extremity pain, constant, knife cutting pain, left worse than right she has been dealing with her bilateral lower extremity pain for many years, much worse recently,  Left leg showed no DVT on doppler study. She had 15 LB weight gain over one month, more difficulty sleepy, body shaking, more difficulty sleepy.  She has tried fentanyl patch 50 mcg, Trileptal, Neurontin  without helping her symptoms .She is going to be seen by her surgeon Dr. Yetta Barre in 3 days  We have revealed MRI of the brain cervical lumbar spine together done at [facility]  imaging in January 2015  At L5-S1: disc bulging and facet hypertrophy, status post microdiscectomy on the left, with expected post-surgical changes. Multi-level facet hypertrophy, with no spinal stenosis or foraminal narrowing  Possible small chronic demyelinating plaque at C6 on the right side. No abnormal enhancing lesions  Multiple supratentorial and 1 infratentorial chronic demyelinating plaques. No acute plaques. Mild cerebellar tonsillar ectopia  We also reviewed, and compared to her previous MRI in 2010 from [facility], there was no significant change in the brain lesions,   UPDATE 08/22/13 Patient returns for follow up. Her biggest  complaint today is restless legs. She has never tried Neupro. She has had multiple trials of medications for her chronic pain. She is currently taking fentanyl, Trileptal, Cymbalta, lidocaine gel  UPDATE Feb 27 2014:  She is not on any long-term immunomodulation therapy for her relapsing remitting multiple sclerosis, neurological deficit has been fairly stable, she has baseline gait difficulty, the most bothersome symptoms for her is low back pain, bilateral lower extremity deep achy pain, radiating pain from left lower back to her left leg, nex  She was recently found to have very low cortisol level, under endocrinologist Dr. Remus Blake care,  REVIEW OF SYSTEMS: Full 14 system review of systems performed and notable only for those listed, all others are neg:  Appetite change, fatigue, no running nose, constipation, diarrhea, nausea, insomnia, frequent wakening, daytime sleepiness, snoring, difficulty urinating, back pain, achy muscles, dizziness, headaches, numbness, weakness, depression  ALLERGIES: No Known Allergies  HOME MEDICATIONS: Outpatient Prescriptions Prior to Visit  Medication Sig Dispense Refill  . ALPRAZolam (XANAX) 0.5 MG tablet 2 tablets at bedtime.    . bifidobacterium infantis (ALIGN) capsule Take 1 capsule by mouth daily.    .  calcium-vitamin D (OSCAL WITH D) 500-200 MG-UNIT per tablet Take 1 tablet by mouth 2 (two) times daily.    . cetirizine (ZYRTEC) 10 MG tablet Take 10 mg by mouth daily.    Marland Kitchen docusate sodium (COLACE) 100 MG capsule Take 100 mg by mouth 2 (two) times daily.    . DULoxetine (CYMBALTA) 30 MG capsule Take 1 capsule (30 mg total) by mouth daily. 90 capsule 3  . fentaNYL (DURAGESIC - DOSED MCG/HR) 50 MCG/HR Place 1 patch (50 mcg total) onto the skin every 3 (three) days. 10 patch 0  . Ferrous Sulfate Dried 200 (65 FE) MG TABS Take 65 mg by mouth daily.    . fludrocortisone (FLORINEF) 0.1 MG tablet One tablet in the morning and two tablets at night. 270 tablet 3  . fluticasone (FLONASE) 50 MCG/ACT nasal spray Place 2 sprays into both nostrils at bedtime as needed for allergies or rhinitis.    Marland Kitchen HYDROcodone-acetaminophen (NORCO/VICODIN) 5-325 MG per tablet     . hydrocortisone (CORTEF) 5 MG tablet Take 2 tablets (10 mg total) by mouth daily. 60 tablet 1  . ibuprofen (ADVIL,MOTRIN) 200 MG tablet Take 400 mg by mouth 2 (two) times daily as needed (pain).    . Multiple Vitamin (MULITIVITAMIN WITH MINERALS) TABS Take 1 tablet by mouth daily.    . Omega-3 Fatty Acids (FISH OIL) 1200 MG CAPS Take 2 capsules by mouth daily.    Marland Kitchen omeprazole (PRILOSEC) 40 MG capsule     . ondansetron (ZOFRAN) 4 MG tablet Take 1 tablet (4  mg total) by mouth every 8 (eight) hours as needed for nausea or vomiting. 15 tablet 1  . Oxcarbazepine (TRILEPTAL) 300 MG tablet Take 150 mg by mouth 2 (two) times daily.     Bertram Gala Glycol-Propyl Glycol (SYSTANE OP) Apply 2 drops to eye daily as needed (for dry eyes).    . potassium chloride (K-DUR,KLOR-CON) 10 MEQ tablet Take 3 in the morning and two at bedtime. 540 tablet 3  . potassium chloride (KLOR-CON 10) 10 MEQ tablet Take 1 tablet (10 mEq total) by mouth 2 (two) times daily. Taking 3 in the am and 2 in the pm. 450 tablet 1  . PRESCRIPTION MEDICATION Apply 1 application topically at  bedtime. meloxicam 0.5/doxepin 3%/amantadine 3%/dextromethorphan 2%/Lidocaine 2%. Compounded prescription.  Apply at 8pm and repeat at 10pm if needed.    . tamsulosin (FLOMAX) 0.4 MG CAPS capsule Take 0.4 mg by mouth daily after breakfast.    . traZODone (DESYREL) 100 MG tablet     . trimethoprim (TRIMPEX) 100 MG tablet 1 tablet at bedtime.    Marland Kitchen estradiol (CLIMARA - DOSED IN MG/24 HR) 0.025 mg/24hr patch     . meloxicam (MOBIC) 7.5 MG tablet     . Rotigotine (NEUPRO) 1 MG/24HR PT24 Place 1 patch (1 mg total) onto the skin daily. 1 mg patch daily for 1 week then increase to  patch 14 patch 0   No facility-administered medications prior to visit.    PAST MEDICAL HISTORY: Past Medical History  Diagnosis Date  . Fibromyalgia   . Restless leg syndrome   . Multiple sclerosis   . Arthritis   . Osteoporosis   . MS (multiple sclerosis) 08/15/2012  . Hypotension   . Depression   . Anxiety   . Anemia   . Headache(784.0)   . GERD (gastroesophageal reflux disease)     PAST SURGICAL HISTORY: Past Surgical History  Procedure Laterality Date  . Appendectomy    . Colectomy    . Cholecystectomy    . Cesarean section      x2  . Abdominal hysterectomy    . Fracture surgery      R leg, rod & screws   . Eye surgery      bilateral - /w IOL  . Lumbar laminectomy/decompression microdiscectomy Left 01/24/2013    Procedure: LUMBAR FIVE TO SACRAL ONE LUMBAR LAMINECTOMY/DECOMPRESSION MICRODISCECTOMY 1 LEVEL;  Surgeon: Tia Alert, MD;  Location: [facility];  Service: Neurosurgery;  Laterality: Left;    FAMILY HISTORY: Family History  Problem Relation Age of Onset  . Hypertension Mother   . Stroke Mother   . Heart attack Father     SOCIAL HISTORY: History   Social History  . Marital Status: Married    Spouse Name: [name]    Number of Children: 2  . Years of Education: 12   Occupational History  .      Disabled   Social History Main Topics  . Smoking status: Never Smoker   .  Smokeless tobacco: Never Used  . Alcohol Use: No  . Drug Use: No  . Sexual Activity: Not on file   Other Topics Concern  . Not on file   Social History Narrative   Pt lives at home with spouse. ([name])   Caffeine Use: 1 cups daily.   Right handed.   Disabled.   Education - high school   Patient has two adult children.     PHYSICAL EXAM  Filed Vitals:   02/27/14  1316  BP: 158/85  Pulse: 80  Height: 5' (1.524 m)  Weight: 121 lb (54.885 kg)   Body mass index is 23.63 kg/(m^2). Generalized: In no acute distress  Neck: Supple, no carotid bruits  Cardiac: Regular rate rhythm  Pulmonary: Clear to auscultation bilaterally  Musculoskeletal: No deformity  Neurological examination  Mentation:  oriented to time, place, history taking, and causual conversation  Cranial nerve II-XII: Pupils were equal round reactive to light extraocular movements were full, visual field were full on confrontational test. facial sensation and strength were normal. hearing was intact to finger rubbing bilaterally. Uvula tongue midline. head turning and shoulder shrug and were normal and symmetric.Tongue protrusion into cheek strength was normal.  Motor:  bilateral lower extremity spasticity, she has mild left hip flexion, knee flexion, left ankle dorsi flexion weakness  Sensory: Intact to fine touch, pinprick, preserved vibratory sensation, and proprioception at toes.  Coordination: Normal finger to nose, heel-to-shin bilaterally  Gait: wide based, mild circumerferential, dragging her left leg, unsteady gait, no assistive device Romberg signs: Negative  Deep tendon reflexes: Brachioradialis 2/2, biceps 2/2, triceps 2/2, patellar 3/3, Achilles 2/2, plantar responses were flexor bilaterally.  DIAGNOSTIC DATA (LABS, IMAGING, TESTING) - I reviewed patient records, labs, notes, testing and imaging myself where available.  Lab Results  Component Value Date   WBC 6.6 02/10/2014   HGB 12.8 02/10/2014   HCT  39.4 02/10/2014   MCV 92.0 02/10/2014   PLT 210.0 02/10/2014      Component Value Date/Time   NA 141 02/10/2014 0838   K 4.6 02/10/2014 0838   CL 104 02/10/2014 0838   CO2 30 02/10/2014 0838   GLUCOSE 69* 02/10/2014 0838   BUN 15 02/10/2014 0838   CREATININE 0.6 02/10/2014 0838   CREATININE 0.85 11/06/2013 0840   CALCIUM 9.0 02/10/2014 0838   PROT 6.5 02/10/2014 0838   ALBUMIN 4.0 02/10/2014 0838   AST 17 02/10/2014 0838   ALT 14 02/10/2014 0838   ALKPHOS 37* 02/10/2014 0838   BILITOT 0.3 02/10/2014 0838   GFRNONAA 86* 01/21/2013 0956   GFRAA >90 01/21/2013 0956    Lab Results  Component Value Date   TSH 1.60 02/10/2014   ASSESSMENT AND PLAN  69 y.o. year old female  has a past medical history of Fibromyalgia; Restless leg syndrome; Multiple sclerosis; Arthritis; Osteoporosis; MS (multiple sclerosis) (08/15/2012); Headache(784.0); here to followup.  Continue current meds, Cymbalta Trileptal, fentanyl, lidocaine.   Return in about 6 months (around 08/28/2014).  Levert Feinstein, M.D. Ph.D.  [facility] [address] [place], [place] [address] Phone: [phone] Fax:      [phone]

## 2014-03-03 ENCOUNTER — Telehealth: Payer: Self-pay | Admitting: Endocrinology

## 2014-03-03 NOTE — Telephone Encounter (Signed)
Need to know what side effects she is having.  It is important for her to start taking this because of low level She can try prednisone 2.5 mg daily with her main meal either morning or evening

## 2014-03-03 NOTE — Telephone Encounter (Signed)
Try Cortef brand 10mg  with food at lunch or dinner

## 2014-03-03 NOTE — Telephone Encounter (Signed)
I called both of the patients pharmacies listed and neither one of them are able to get this medication.

## 2014-03-03 NOTE — Telephone Encounter (Signed)
Please see below and advise.

## 2014-03-03 NOTE — Telephone Encounter (Signed)
Patient called stating Dr. Lucianne Muss prescribed hydrocortisone She is wanting to discontinue due to side effects    Please advise patient    Thank you

## 2014-03-03 NOTE — Telephone Encounter (Signed)
Please find out if the pharmacist can get her cortisone acetate 25 mg, 1 tablet daily

## 2014-03-03 NOTE — Telephone Encounter (Signed)
She said she feels very weak, nausea no appetite, She wants to know if there is an injection she can take,  She said she has tried prednisone before and can't take that either.  Please advise.

## 2014-03-04 ENCOUNTER — Other Ambulatory Visit: Payer: Self-pay | Admitting: *Deleted

## 2014-03-04 MED ORDER — CORTEF 10 MG PO TABS: 10.0000 mg | ORAL_TABLET | Freq: Every day | ORAL | Status: DC

## 2014-03-04 NOTE — Telephone Encounter (Signed)
Left message for patient, what about filling her trazodone?

## 2014-03-04 NOTE — Telephone Encounter (Signed)
This is from pcp

## 2014-03-04 NOTE — Telephone Encounter (Signed)
Patient is willing to take the cortef, she said she has been taking an extra half tablet of trazodone and wants to know if you can authorize a new rx until she see's Dr. Tiburcio Pea at the end of February.  She also wants to know if there is anything else besides the prilosec that she can take to coat her stomach before taking the cortef?

## 2014-03-04 NOTE — Telephone Encounter (Signed)
Should take the Zofran and Prilosec about 30 minutes before taking her Cortef.  If still having nausea with can try metoclopramide 5 mg

## 2014-03-05 ENCOUNTER — Other Ambulatory Visit: Payer: Self-pay | Admitting: *Deleted

## 2014-03-05 MED ORDER — TRAZODONE HCL 100 MG PO TABS: ORAL_TABLET | ORAL | Status: DC

## 2014-03-07 ENCOUNTER — Telehealth: Payer: Self-pay | Admitting: *Deleted

## 2014-03-07 NOTE — Telephone Encounter (Signed)
Katrina Rivera, please let patient's know, she may move her Cymbalta to bedtime, to see that helps, if she continued to have insomnia, may consider other options, this can be managed by her primary care physician, on next follow-up appointment

## 2014-03-07 NOTE — Telephone Encounter (Signed)
Patient wants to know if Cymbalta 30 mg will help her sleep at night and help her moods and depression. The is currently taking it during the day. Please advise

## 2014-03-07 NOTE — Telephone Encounter (Signed)
Patient aware and will alter her Cymbalta schedule to see if it helps.

## 2014-03-11 ENCOUNTER — Telehealth: Payer: Self-pay | Admitting: Endocrinology

## 2014-03-11 ENCOUNTER — Encounter: Payer: Self-pay | Admitting: Endocrinology

## 2014-03-11 ENCOUNTER — Ambulatory Visit (INDEPENDENT_AMBULATORY_CARE_PROVIDER_SITE_OTHER): Payer: PPO | Admitting: Endocrinology

## 2014-03-11 VITALS — BP 152/84 | HR 71 | Temp 98.0°F | Resp 14 | Ht 60.0 in | Wt 116.8 lb

## 2014-03-11 DIAGNOSIS — E2740 Unspecified adrenocortical insufficiency: Secondary | ICD-10-CM

## 2014-03-11 DIAGNOSIS — G903 Multi-system degeneration of the autonomic nervous system: Secondary | ICD-10-CM

## 2014-03-11 NOTE — Patient Instructions (Addendum)
2 Florinef daily for now Stop hydrocortisone

## 2014-03-11 NOTE — Progress Notes (Signed)
Patient ID: Katrina Rivera, female   DOB: 05-25-1945, 69 y.o.   MRN: [id number]   Subjective:     Chief complaint: Followup of multiple problems  PAST history: She has had long-standing problems with orthostatic hypotension and also hyponatremia. Has been diagnosed with dysautonomia and has multiple other problems related to autonomic neuropathy.    She has been on Florinef since 2004 usually taking 3 tablets daily along with 5 tablets of potassium which had previously controlled her symptoms well . She apparently does better with the regimen of one tablet in the morning and 2 in the evening.  Blood pressure tends to get high if she increases Florinef to 4 tablets  Because of persistent orthostatic symptoms she had been tried on midodrine on 05/28/12 but this was later stopped when blood pressure increased.  She  had medication adjustments done frequently over the last year for regulating her blood pressure.  RECENT history: She is on Florinef 3 tablets a day consistently now and as before is taking one in the morning and 2 in the evening  She feels fairly good with no lightheadedness when standing up Despite reminders she has not been monitoring her blood pressure at home again, previously had been doing so regularly at home including standing blood pressure readings.   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  Prior testing included Cortrosyn test baseline with stimulated level of 15.7, baseline 3.9.  The response to therapy previously was Improved symptoms with better appetite, no further weight loss and less frequent nausea. She stopped her hydrocortisone in 4/15 because of concerns about weight gain and excessive bruising, abdominal discomfort and increased  appetite. Recently because of weight loss and decreased appetite and fatigue her cortisol level was tested and it was only 0.3 in the morning  She has been tried on hydrocortisone again and she claims that this makes her feel nauseous, affecting her sleep as well as concentration and causes her to be more tired.  She has tried taking this 5 mg at breakfast and 5 at lunch She came in today because of feeling worse with trying the hydrocortisone over the last 3-4 weeks  Wt Readings from Last 3 Encounters:  03/11/14 116 lb 12.8 oz (52.98 kg)  02/27/14 121 lb (54.885 kg)  02/13/14 117 lb 12.8 oz (53.434 kg)    General Endocrinology:    Pituitary: She has had a low free T4  but did not tolerate thyroxine supplementation and free T4 has been variable Free T4 is consistently normal now   Has not had any abnormalities of the pituitary on previous MRI   LABS:  No visits with results within 1 Week(s) from this visit. Latest known visit with results is:  Appointment on 02/10/2014  Component Date Value Ref Range Status  . Cortisol, Plasma 02/10/2014 0.3   Final   AM:  4.3 - 22.4 ug/dLPM:  3.1 - 16.7 ug/dL  . Sodium 02/10/2014 141  135 - 145 mEq/L Final  . Potassium 02/10/2014 4.6  3.5 - 5.1 mEq/L Final  . Chloride 02/10/2014 104  96 - 112 mEq/L Final  . CO2 02/10/2014 30  19 - 32 mEq/L Final  . Glucose, Bld 02/10/2014 69* 70 - 99 mg/dL Final  . BUN 40/98/1191 15  6 - 23 mg/dL Final  . Creatinine, Ser 02/10/2014 0.6  0.4 - 1.2 mg/dL Final  . Total Bilirubin 02/10/2014 0.3  0.2 - 1.2 mg/dL Final  . Alkaline Phosphatase 02/10/2014 37* 39 - 117 U/L Final  . AST 02/10/2014 17  0 - 37 U/L Final  . ALT 02/10/2014 14  0 - 35 U/L Final  . Total Protein 02/10/2014 6.5  6.0 - 8.3 g/dL Final  . Albumin 25/36/6440 4.0  3.5 - 5.2 g/dL Final  . Calcium 34/74/2595 9.0  8.4 - 10.5 mg/dL Final  . GFR 63/87/5643 98.56  >60.00 mL/min Final  . WBC 02/10/2014 6.6  4.0 - 10.5 K/uL Final  . RBC 02/10/2014  4.28  3.87 - 5.11 Mil/uL Final  . Platelets 02/10/2014 210.0  150.0 - 400.0 K/uL Final  . Hemoglobin 02/10/2014 12.8  12.0 - 15.0 g/dL Final  . HCT 32/95/1884 39.4  36.0 - 46.0 % Final  . MCV 02/10/2014 92.0  78.0 - 100.0 fl Final  . MCHC 02/10/2014 32.5  30.0 - 36.0 g/dL Final  . RDW 16/60/6301 15.1  11.5 - 15.5 % Final  . TSH 02/10/2014 1.60  0.35 - 4.50 uIU/mL Final  . Free T4 02/10/2014 0.66  0.60 - 1.60 ng/dL Final          Medication List       This list is accurate as of: 03/11/14 10:23 AM.  Always use your most recent med list.               ALPRAZolam 0.5 MG tablet  Commonly known as:  XANAX  2 tablets at bedtime.     bifidobacterium infantis capsule  Take 1 capsule by mouth daily.     calcium-vitamin D 500-200 MG-UNIT per tablet  Commonly known as:  OSCAL WITH D  Take 1 tablet by mouth 2 (two) times daily.     cetirizine 10 MG tablet  Commonly known as:  ZYRTEC  Take 10 mg by mouth daily.     CORTEF 10 MG tablet  Generic drug:  hydrocortisone  Take 1 tablet (10 mg total) by mouth daily.     docusate sodium 100 MG capsule  Commonly known as:  COLACE  Take 100 mg by mouth 2 (two) times daily.     DULoxetine 30 MG capsule  Commonly known as:  CYMBALTA  Take 1 capsule (30 mg total) by mouth daily.     fentaNYL 50 MCG/HR  Commonly known as:  DURAGESIC - dosed mcg/hr  Place 1 patch (50 mcg total) onto the skin every 3 (three) days.     Ferrous Sulfate Dried 200 (65 FE) MG Tabs  Take 65 mg by mouth daily.     Fish Oil 1200 MG Caps  Take 2 capsules by mouth daily.     fludrocortisone 0.1 MG tablet  Commonly known as:  FLORINEF  One tablet in the morning and two tablets at night.     fluticasone 50 MCG/ACT nasal spray  Commonly known as:  FLONASE  Place 2 sprays into both nostrils at bedtime as needed for allergies or rhinitis.     HYDROcodone-acetaminophen 5-325 MG per tablet  Commonly known as:  NORCO/VICODIN     ibuprofen 200 MG tablet   Commonly known as:  ADVIL,MOTRIN  Take 400 mg by mouth 2 (two) times daily as needed (pain).     multivitamin with minerals Tabs tablet  Take 1 tablet by mouth daily.     omeprazole 40  MG capsule  Commonly known as:  PRILOSEC     ondansetron 4 MG tablet  Commonly known as:  ZOFRAN  Take 1 tablet (4 mg total) by mouth every 8 (eight) hours as needed for nausea or vomiting.     OXcarbazepine 150 MG tablet  Commonly known as:  TRILEPTAL  Take 1 tablet (150 mg total) by mouth 2 (two) times daily.     potassium chloride 10 MEQ tablet  Commonly known as:  K-DUR,KLOR-CON  Take 3 in the morning and two at bedtime.     potassium chloride 10 MEQ tablet  Commonly known as:  KLOR-CON 10  Take 1 tablet (10 mEq total) by mouth 2 (two) times daily. Taking 3 in the am and 2 in the pm.     PRESCRIPTION MEDICATION  Apply 1 application topically at bedtime. meloxicam 0.5/doxepin 3%/amantadine 3%/dextromethorphan 2%/Lidocaine 2%. Compounded prescription.  Apply at 8pm and repeat at 10pm if needed.     SYSTANE OP  Apply 2 drops to eye daily as needed (for dry eyes).     tamsulosin 0.4 MG Caps capsule  Commonly known as:  FLOMAX  Take 0.4 mg by mouth daily after breakfast.     traZODone 100 MG tablet  Commonly known as:  DESYREL  Take 1 tablet at bedtime     trimethoprim 100 MG tablet  Commonly known as:  TRIMPEX  1 tablet at bedtime.        Review of Systems   Gets constipated and also diarrhea at times  Increased sweating: Has had hot flashes and periodic sweating in the daytime  Did not tolerate propantheline. Had been slightly better with Climara 0.1 mg and subsequently  taking Vivelle which she stopped because of cost. Effexor was also tried but she did not tolerate this and also sweating was no better She was also tried on clonidine patches without much relief She was retried on hormone patches but she stopped this because he felt it was causing drowsiness She does not  complain of excessive symptoms now  Genitourinary: Positive for difficulty urinating.       She was taking urecholine for her  neurogenic bladder  but stopped this because of excessive salivation Neurological: Positive for numbness.       Has peripheral neuropathy, symptomatic of unclear etiology and followed by neurologist  She has had significant amount of neuropathic pain in her feet and legs and was not relieved with large doses of gabapentin. She is on Cymbalta and Trileptal and also local lidocaine   She has been anemic and is followed by PCP        Objective:   Physical Exam  BP 152/84 mmHg  Pulse 71  Temp(Src) 98 F (36.7 C)  Resp 14  Ht 5' (1.524 m)  Wt 116 lb 12.8 oz (52.98 kg)  BMI 22.81 kg/m2  SpO2 94%   Repeat blood pressure standing is 155/90  No pedal edema  Assessment:      Adrenal insufficiency: She has adrenal insufficiency as judged by her symptoms and very low fasting cortisol level in 1/16 She is paradoxically feeling worse with starting hydrocortisone and claims she cannot tolerate even 10 mg a day She is also having intolerance to this with worsening insomnia and decreased concentration as well as more fatigue.  Not clear if blood pressure is increasing with taking hydrocortisone   Orthostatic dysautonomic hypotension syndrome:   Her blood pressure appears to be higher now with Florinef 3 tablets a  day  Does not think she is very anxious today She has not monitored her blood pressure    Plan:      Discussed adrenal insufficiency and current studies Since she has taken her hydrocortisone today will not evaluate her cortisol levels today but scheduled her for a Cortrosyn stimulation  test tomorrow and repeat electrolytes She will hold her hydrocortisone until studies are complete Consider urine free cortisol also  Followup in 1 week Recommended monitoring blood pressure periodically at home also and to bring her monitor for comparison      [facility]

## 2014-03-11 NOTE — Telephone Encounter (Signed)
Patient no showed today's appt. Please advise on how to follow up. °A. No follow up necessary. °B. Follow up urgent. Contact patient immediately. °C. Follow up necessary. Contact patient and schedule visit in ___ days. °D. Follow up advised. Contact patient and schedule visit in ____weeks. ° °

## 2014-03-12 ENCOUNTER — Telehealth: Payer: Self-pay

## 2014-03-12 ENCOUNTER — Other Ambulatory Visit: Payer: Self-pay | Admitting: *Deleted

## 2014-03-12 ENCOUNTER — Other Ambulatory Visit (INDEPENDENT_AMBULATORY_CARE_PROVIDER_SITE_OTHER): Payer: PPO

## 2014-03-12 DIAGNOSIS — E2740 Unspecified adrenocortical insufficiency: Secondary | ICD-10-CM

## 2014-03-12 LAB — BASIC METABOLIC PANEL
BUN: 16 mg/dL (ref 6–23)
CHLORIDE: 105 meq/L (ref 96–112)
CO2: 32 meq/L (ref 19–32)
Calcium: 8.7 mg/dL (ref 8.4–10.5)
Creatinine, Ser: 0.72 mg/dL (ref 0.40–1.20)
GFR: 86.02 mL/min (ref >60.00)
Glucose, Bld: 75 mg/dL (ref 70–99)
POTASSIUM: 4.3 meq/L (ref 3.5–5.1)
SODIUM: 139 meq/L (ref 135–145)

## 2014-03-12 LAB — CORTISOL
CORTISOL PLASMA: 0.3 ug/dL
Cortisol, Plasma: 1.1 ug/dL

## 2014-03-12 MED ORDER — COSYNTROPIN 0.25 MG IJ SOLR
0.2500 mg | Freq: Once | INTRAMUSCULAR | Status: AC
Start: 2014-03-12 — End: 2014-03-12
  Administered 2014-03-12: 0.25 mg via INTRAMUSCULAR

## 2014-03-12 NOTE — Telephone Encounter (Signed)
Patient was seen. 

## 2014-03-12 NOTE — Telephone Encounter (Signed)
Patient wants Dr.Yan to call back and see what she can do over the phone.

## 2014-03-12 NOTE — Telephone Encounter (Signed)
I have called Katrina Rivera, she was started on Trileptal 150 mg twice a day since February 27 2014, complains of dizziness, blurry vision, difficulty concentrating, it did not help her low back pain, bilateral lower extremity deep achy pain. I have advised her try half tablets twice a day for 4 days, then stop the medication,

## 2014-03-12 NOTE — Telephone Encounter (Signed)
Patient states she would like to stop taking Oxcarbazepine (TRILEPTAL) 150 MG tablet bid due to side effects she is having. She states she is having blurred vision, confusion, lack of concentration, and fatigue.

## 2014-03-13 ENCOUNTER — Other Ambulatory Visit: Payer: Self-pay | Admitting: *Deleted

## 2014-03-13 ENCOUNTER — Telehealth: Payer: Self-pay | Admitting: *Deleted

## 2014-03-13 MED ORDER — HYDROCORTISONE NA SUCCINATE PF 100 MG IJ SOLR: 100.0000 mg | Freq: Every day | INTRAMUSCULAR | Status: DC

## 2014-03-13 NOTE — Telephone Encounter (Signed)
Instructions outlined in the result note of cortisol labs

## 2014-03-13 NOTE — Progress Notes (Signed)
Quick Note:  Please let patient know that the cortisol level is very low and no response to the injection. Start taking hydrocortisone/Solu-Cortef the 100 mg/2 mL vial and measure 20 units on an insulin syringe to be taken every morning on waking up. Needs follow-up in about a week  ______

## 2014-03-13 NOTE — Telephone Encounter (Signed)
Pharmacist said they come as solu-cortef in 100mg /13ml vials.

## 2014-03-14 ENCOUNTER — Other Ambulatory Visit: Payer: Self-pay | Admitting: *Deleted

## 2014-03-14 ENCOUNTER — Ambulatory Visit
Admission: RE | Admit: 2014-03-14 | Discharge: 2014-03-14 | Disposition: A | Discharge status: Home / Self Care | Payer: PPO | Source: Ambulatory Visit | Attending: Gastroenterology | Admitting: Gastroenterology

## 2014-03-14 ENCOUNTER — Other Ambulatory Visit: Payer: Self-pay | Admitting: Gastroenterology

## 2014-03-14 DIAGNOSIS — R197 Diarrhea, unspecified: Secondary | ICD-10-CM

## 2014-03-18 ENCOUNTER — Ambulatory Visit: Payer: PPO | Admitting: Endocrinology

## 2014-03-19 ENCOUNTER — Other Ambulatory Visit: Payer: Self-pay | Admitting: *Deleted

## 2014-03-20 ENCOUNTER — Ambulatory Visit (INDEPENDENT_AMBULATORY_CARE_PROVIDER_SITE_OTHER): Payer: PPO | Admitting: Endocrinology

## 2014-03-20 ENCOUNTER — Encounter: Payer: Self-pay | Admitting: Endocrinology

## 2014-03-20 ENCOUNTER — Other Ambulatory Visit: Payer: Self-pay | Admitting: *Deleted

## 2014-03-20 VITALS — BP 152/75 | HR 80 | Temp 98.3°F | Resp 14 | Ht 60.0 in | Wt 116.6 lb

## 2014-03-20 DIAGNOSIS — R634 Abnormal weight loss: Secondary | ICD-10-CM

## 2014-03-20 DIAGNOSIS — G903 Multi-system degeneration of the autonomic nervous system: Secondary | ICD-10-CM

## 2014-03-20 DIAGNOSIS — E038 Other specified hypothyroidism: Secondary | ICD-10-CM

## 2014-03-20 DIAGNOSIS — I951 Orthostatic hypotension: Secondary | ICD-10-CM

## 2014-03-20 DIAGNOSIS — E2740 Unspecified adrenocortical insufficiency: Secondary | ICD-10-CM

## 2014-03-20 LAB — BASIC METABOLIC PANEL
BUN: 13 mg/dL (ref 6–23)
CALCIUM: 9.1 mg/dL (ref 8.4–10.5)
CO2: 31 mEq/L (ref 19–32)
Chloride: 106 mEq/L (ref 96–112)
Creatinine, Ser: 0.76 mg/dL (ref 0.40–1.20)
GFR: 80.81 mL/min (ref >60.00)
GLUCOSE: 87 mg/dL (ref 70–99)
POTASSIUM: 4.1 meq/L (ref 3.5–5.1)
SODIUM: 142 meq/L (ref 135–145)

## 2014-03-20 MED ORDER — HYDROCORTISONE NA SUCCINATE PF 100 MG IJ SOLR: INTRAMUSCULAR | Status: DC

## 2014-03-20 NOTE — Patient Instructions (Signed)
Take 40 units in am and after 2 days add 20 units at 4 pm

## 2014-03-20 NOTE — Addendum Note (Signed)
Addended by: Perlie Mayo on: 03/20/2014 08:56 AM   Modules accepted: Orders

## 2014-03-20 NOTE — Progress Notes (Signed)
Patient ID: Katrina Rivera, female   DOB: 04-27-1945, 69 y.o.   MRN: [id number]   Subjective:     Chief complaint: Followup of adrenal insufficiency  PAST history: She has had long-standing problems with orthostatic hypotension and also hyponatremia. Has been diagnosed with dysautonomia and has multiple other problems related to autonomic neuropathy.    She has been on Florinef since 2004 usually taking 3 tablets daily along with 5 tablets of potassium which had previously controlled her symptoms well . She apparently does better with the regimen of one tablet in the morning and 2 in the evening.  Blood pressure tends to get high if she increases Florinef to 4 tablets  Because of persistent orthostatic symptoms she had been tried on midodrine on 05/28/12 but this was later stopped when blood pressure increased.  She  had medication adjustments done frequently over the last year for regulating her blood pressure.  RECENT history: She is on Florinef 2 tablets a day consistently now and as before is taking one in the morning and 1 in the evening  She has no lightheadedness when standing up She has checked her blood pressure a few times and the last couple of readings standing have been 131/83; 130/84. 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  Prior testing included Cortrosyn test baseline with stimulated level of 15.7, baseline 3.9.  However recent testing showed much lower cortisol levels She appears to be again symptomatic with weight loss, decreased appetite, nausea and also diarrhea Although previously she had tolerated oral hydrocortisone and small doses with improvement in her symptoms she did not continue this long-term She  stopped her hydrocortisone in 4/15 because of concerns about weight gain and excessive bruising, abdominal discomfort and increased appetite.  Recently she was tried on oral hydrocortisone again and  she felt that this makes her feel nauseous, affecting her sleep as well as concentration and causes her to be more tired.    She has now been started on hydrocortisone injections using insulin syringe and her husband has been trained in doing this.  She is taking 0.2 mL which is the same as 10 mg.  She is doing this it at 11 AM Does not feel any better and still having nausea, decreased appetite and some diarrhea.  She continues to have insomnia also  Wt Readings from Last 3 Encounters:  03/20/14 116 lb 9.6 oz (52.889 kg)  03/11/14 116 lb 12.8 oz (52.98 kg)  02/27/14 121 lb (54.885 kg)    General Endocrinology:    Pituitary: She has had a low free T4  but did not tolerate thyroxine supplementation and free T4 has been variable Free T4 is consistently normal now although the last level is low normal  Has not had any abnormalities of the pituitary on previous MRI   LABS:  No visits with results within 1 Week(s) from this visit. Latest known visit with results is:  Lab on 03/12/2014  Component Date Value Ref Range Status  . Cortisol, Plasma 03/12/2014 0.3   Final   AM:  4.3 - 22.4 ug/dLPM:  3.1 - 16.7 ug/dL  . Cortisol, Plasma 03/12/2014 1.1   Final   AM:  4.3 - 22.4 ug/dLPM:  3.1 -  16.7 ug/dL  . Sodium 03/12/2014 139  135 - 145 mEq/L Final  . Potassium 03/12/2014 4.3  3.5 - 5.1 mEq/L Final  . Chloride 03/12/2014 105  96 - 112 mEq/L Final  . CO2 03/12/2014 32  19 - 32 mEq/L Final  . Glucose, Bld 03/12/2014 75  70 - 99 mg/dL Final  . BUN 91/47/8295 16  6 - 23 mg/dL Final  . Creatinine, Ser 03/12/2014 0.72  0.40 - 1.20 mg/dL Final  . Calcium 62/13/0865 8.7  8.4 - 10.5 mg/dL Final  . GFR 78/46/9629 86.02  >60.00 mL/min Final          Medication List       This list is accurate  as of: 03/20/14  8:16 AM.  Always use your most recent med list.               ALPRAZolam 0.5 MG tablet  Commonly known as:  XANAX  2 tablets at bedtime.     bifidobacterium infantis capsule  Take 1 capsule by mouth daily.     calcium-vitamin D 500-200 MG-UNIT per tablet  Commonly known as:  OSCAL WITH D  Take 1 tablet by mouth 2 (two) times daily.     cetirizine 10 MG tablet  Commonly known as:  ZYRTEC  Take 10 mg by mouth daily.     CORTEF 10 MG tablet  Generic drug:  hydrocortisone  Take 1 tablet (10 mg total) by mouth daily.     docusate sodium 100 MG capsule  Commonly known as:  COLACE  Take 100 mg by mouth 2 (two) times daily.     DULoxetine 30 MG capsule  Commonly known as:  CYMBALTA  Take 1 capsule (30 mg total) by mouth daily.     fentaNYL 50 MCG/HR  Commonly known as:  DURAGESIC - dosed mcg/hr  Place 1 patch (50 mcg total) onto the skin every 3 (three) days.     Ferrous Sulfate Dried 200 (65 FE) MG Tabs  Take 65 mg by mouth daily.     Fish Oil 1200 MG Caps  Take 2 capsules by mouth daily.     fludrocortisone 0.1 MG tablet  Commonly known as:  FLORINEF  One tablet in the morning and two tablets at night.     fluticasone 50 MCG/ACT nasal spray  Commonly known as:  FLONASE  Place 2 sprays into both nostrils at bedtime as needed for allergies or rhinitis.     HYDROcodone-acetaminophen 5-325 MG per tablet  Commonly known as:  NORCO/VICODIN     hydrocortisone sodium succinate 100 MG Solr injection  Commonly known as:  SOLU-CORTEF  Inject 2 mLs (100 mg total) into the vein daily.     ibuprofen 200 MG tablet  Commonly known as:  ADVIL,MOTRIN  Take 400 mg by mouth 2 (two) times daily as needed (pain).     multivitamin with minerals Tabs tablet  Take 1 tablet by mouth daily.     omeprazole 40 MG capsule  Commonly known as:  PRILOSEC     ondansetron 4 MG tablet  Commonly known as:  ZOFRAN  Take 1 tablet (4 mg total) by mouth every 8 (eight) hours as  needed for nausea or vomiting.     OXcarbazepine 150 MG tablet  Commonly known as:  TRILEPTAL  Take 1 tablet (150 mg total) by mouth 2 (two) times daily.     potassium chloride 10 MEQ tablet  Commonly known as:  K-DUR,KLOR-CON  Take 3  in the morning and two at bedtime.     potassium chloride 10 MEQ tablet  Commonly known as:  KLOR-CON 10  Take 1 tablet (10 mEq total) by mouth 2 (two) times daily. Taking 3 in the am and 2 in the pm.     PRESCRIPTION MEDICATION  Apply 1 application topically at bedtime. meloxicam 0.5/doxepin 3%/amantadine 3%/dextromethorphan 2%/Lidocaine 2%. Compounded prescription.  Apply at 8pm and repeat at 10pm if needed.     SYSTANE OP  Apply 2 drops to eye daily as needed (for dry eyes).     tamsulosin 0.4 MG Caps capsule  Commonly known as:  FLOMAX  Take 0.4 mg by mouth daily after breakfast.     traZODone 100 MG tablet  Commonly known as:  DESYREL  Take 1 tablet at bedtime     trimethoprim 100 MG tablet  Commonly known as:  TRIMPEX  1 tablet at bedtime.        Review of Systems   Current information is a copy of the previous note:  Increased sweating: Has had hot flashes and periodic sweating in the daytime  Did not tolerate propantheline. Had been slightly better with Climara 0.1 mg and subsequently  taking Vivelle which she stopped because of cost. Effexor was also tried but she did not tolerate this and also sweating was no better She was also tried on clonidine patches without much relief She was retried on hormone patches but she stopped this because he felt it was causing drowsiness She does not complain of excessive symptoms now  Genitourinary: Positive for difficulty urinating.       She was taking urecholine for her  neurogenic bladder  but stopped this because of excessive salivation Neurological: Positive for numbness.       Has peripheral neuropathy, symptomatic of unclear etiology and followed by neurologist  She has had significant  amount of neuropathic pain in her feet and legs and was not relieved with large doses of gabapentin. She is on Cymbalta and Trileptal and also local lidocaine   She has been anemic and is followed by PCP        Objective:   Physical Exam  BP 152/75 mmHg  Pulse 80  Temp(Src) 98.3 F (36.8 C)  Resp 14  Ht 5' (1.524 m)  Wt 116 lb 9.6 oz (52.889 kg)  BMI 22.77 kg/m2  SpO2 97%   Repeat blood pressure standing is 152/60 She looks slightly pale  No pedal edema  Assessment:      Adrenal insufficiency: She has adrenal insufficiency as judged by her symptoms and very low cortisol levels on the stimulation test  She has been able take the injectable hydrocortisone without any side effects, was not able to tolerate oral supplements. Since she is still symptomatic will raise her dose to the full therapeutic dose of 20 mg in the morning and 10 mg in the evening  by injection Prior authorization will be needed for the insurance   Orthostatic dysautonomic hypotension syndrome:   Her blood pressure appears to be higher now with Florinef 2  tablets a day  Blood pressure is relatively higher at home also.   Plan:      New prescription for higher doses of hydrocortisone as above   She will use 40 units on the insulin syringe for the morning dose and after 2 days add another 20 units in the late afternoon Reduce Florinef to one tablet today and continue monitoring blood pressure standing  Followup  in 2 weeks Recommended monitoring blood pressure periodically at home also and to bring her monitor for comparison     [facility]

## 2014-03-21 LAB — INSULIN-LIKE GROWTH FACTOR: Insulin-Like GF-1: 71 ng/mL (ref 38–163)

## 2014-03-25 NOTE — Telephone Encounter (Signed)
error 

## 2014-03-26 ENCOUNTER — Other Ambulatory Visit (INDEPENDENT_AMBULATORY_CARE_PROVIDER_SITE_OTHER): Payer: PPO | Admitting: *Deleted

## 2014-03-26 DIAGNOSIS — E2740 Unspecified adrenocortical insufficiency: Secondary | ICD-10-CM

## 2014-03-26 MED ORDER — COSYNTROPIN 0.25 MG IJ SOLR
0.2500 mg | Freq: Once | INTRAMUSCULAR | Status: AC
  Administered 2014-03-12: 0.25 mg via INTRAMUSCULAR

## 2014-04-03 ENCOUNTER — Ambulatory Visit (INDEPENDENT_AMBULATORY_CARE_PROVIDER_SITE_OTHER): Payer: PPO | Admitting: Endocrinology

## 2014-04-03 ENCOUNTER — Other Ambulatory Visit: Payer: Self-pay

## 2014-04-03 ENCOUNTER — Encounter: Payer: Self-pay | Admitting: Endocrinology

## 2014-04-03 VITALS — BP 120/62 | HR 79 | Temp 98.5°F | Ht 60.0 in | Wt 115.0 lb

## 2014-04-03 DIAGNOSIS — I951 Orthostatic hypotension: Secondary | ICD-10-CM

## 2014-04-03 DIAGNOSIS — E2740 Unspecified adrenocortical insufficiency: Secondary | ICD-10-CM

## 2014-04-03 DIAGNOSIS — G903 Multi-system degeneration of the autonomic nervous system: Secondary | ICD-10-CM

## 2014-04-03 MED ORDER — HYDROCORTISONE NA SUCCINATE PF 100 MG IJ SOLR: INTRAMUSCULAR | Status: DC

## 2014-04-03 NOTE — Progress Notes (Signed)
Patient ID: Katrina Rivera, female   DOB: Apr 21, 1945, 69 y.o.   MRN: [id number]   Subjective:     Chief complaint: Followup of adrenal insufficiency  PAST history: She has had long-standing problems with orthostatic hypotension and also hyponatremia. Has been diagnosed with dysautonomia and has multiple other problems related to autonomic neuropathy.   She has been on Florinef since 2004 usually taking 3 tablets daily along with 5 tablets of potassium which had previously controlled her symptoms well . She apparently does better with the regimen of one tablet in the morning and 2 in the evening.  Blood pressure tends to get high if she increases Florinef to 4 tablets  Because of persistent orthostatic symptoms she had been tried on midodrine on 05/28/12 but this was later stopped when blood pressure increased.  She  had medication adjustments done frequently over the last year for regulating her blood pressure.  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  Prior testing included Cortrosyn test baseline with stimulated level of 15.7, baseline 3.9.  Although previously she had tolerated oral hydrocortisone and small doses with improvement in her symptoms she did not continue this long-term She stopped her hydrocortisone in 4/15 because of concerns about weight gain and excessive bruising, abdominal discomfort and increased appetite.  She was again symptomatic with weight loss, decreased appetite, nausea and also diarrhea Cortrosyn stimulation test on 03/12/14 showed baseline level of 0.3 and post injection of 1.1 only  she was tried on oral hydrocortisone again and she felt that this makes her feel nauseous, affecting her sleep as well as concentration and causes her to be more tired.    She has been started on hydrocortisone injections using insulin syringe and her  husband has been trained in doing this.   She is taking 0.4 mL in the morning and 0.2 mL at around 5 PM which is giving her 20 mg the morning and 10 mg the morning making it a total of 40 mg a day With increasing her dose on 03/20/14 she has started to feel better with her energy level and does not get tired anymore. However she still complains of decreased appetite, aversion to food and some nausea.  She can eat certain foods especially when going out Does not gain back any weight Has had less nausea overall but occasionally will take medication for nausea  Wt Readings from Last 3 Encounters:  04/03/14 115 lb (52.164 kg)  03/20/14 116 lb 9.6 oz (52.889 kg)  03/11/14 116 lb 12.8 oz (52.98 kg)    AUTONOMIC dysfunction:  She is on Florinef 2 tablets a day consistently now.  She was tried on one tablet daily when her blood pressure was relatively high on 03/20/14 but a few days later her blood pressure started getting low, lowest reading 90 systolic standing up  She has no lightheadedness when standing up She has checked her blood pressure more regularlyand the last few systolic readings standing have been 118-139  Her potassium has been normal with taking 4 potassium tablets   General Endocrinology:    Pituitary: She has had a low free T4  but did not tolerate thyroxine supplementation and free T4 has been variable Free T4 is consistently normal now although the last level is low normal  Has not had any abnormalities of the pituitary on previous MRI   LABS:  No visits with results within 1 Week(s) from this visit. Latest known visit with results is:  Office Visit on 03/20/2014  Component Date Value Ref Range Status  . Sodium 03/20/2014 142  135 - 145 mEq/L Final  . Potassium 03/20/2014 4.1  3.5 - 5.1 mEq/L Final  . Chloride 03/20/2014 106  96 - 112 mEq/L Final  . CO2 03/20/2014 31  19 - 32 mEq/L Final  . Glucose, Bld 03/20/2014 87  70 - 99 mg/dL Final  . BUN 65/79/0383 13  6 - 23  mg/dL Final  . Creatinine, Ser 03/20/2014 0.76  0.40 - 1.20 mg/dL Final  . Calcium 33/83/2919 9.1  8.4 - 10.5 mg/dL Final  . GFR 16/60/6004 80.81  >60.00 mL/min Final  . Insulin-Like GF-1 03/20/2014 71  38 - 163 ng/mL Final          Medication List       This list is accurate as of: 04/03/14  1:53 PM.  Always use your most recent med list.               ALPRAZolam 0.5 MG tablet  Commonly known as:  XANAX  2 tablets at bedtime.     bifidobacterium infantis capsule  Take 1 capsule by mouth daily.     calcium-vitamin D 500-200 MG-UNIT per tablet  Commonly known as:  OSCAL WITH D  Take 1 tablet by mouth 2 (two) times daily.     cetirizine 10 MG tablet  Commonly known as:  ZYRTEC  Take 10 mg by mouth daily.     CORTEF 10 MG tablet  Generic drug:  hydrocortisone  Take 1 tablet (10 mg total) by mouth daily.     docusate sodium 100 MG capsule  Commonly known as:  COLACE  Take 100 mg by mouth 2 (two) times daily.     DULoxetine 30 MG capsule  Commonly known as:  CYMBALTA  Take 1 capsule (30 mg total) by mouth daily.     fentaNYL 50 MCG/HR  Commonly known as:  DURAGESIC - dosed mcg/hr  Place 1 patch (50 mcg total) onto the skin every 3 (three) days.     Ferrous Sulfate Dried 200 (65 FE) MG Tabs  Take 65 mg by mouth daily.     Fish Oil 1200 MG Caps  Take 2 capsules by mouth daily.     fludrocortisone 0.1 MG tablet  Commonly known as:  FLORINEF  One tablet in the morning and two tablets at night.     fluticasone 50 MCG/ACT nasal spray  Commonly known as:  FLONASE  Place 2 sprays into both nostrils at bedtime as needed for allergies or rhinitis.     HYDROcodone-acetaminophen 5-325 MG per tablet  Commonly known as:  NORCO/VICODIN     hydrocortisone sodium succinate 100 MG Solr injection  Commonly known as:  SOLU-CORTEF  Inject  0.4 mls every morning and 0.64mls every evening.     ibuprofen 200 MG tablet  Commonly known as:  ADVIL,MOTRIN  Take 400 mg by mouth 2  (two) times daily as needed (pain).     multivitamin with minerals Tabs tablet  Take 1 tablet by mouth daily.     omeprazole 40 MG capsule  Commonly known as:  PRILOSEC     ondansetron 4 MG tablet  Commonly known as:  ZOFRAN  Take 1 tablet (4 mg total) by mouth every 8 (eight) hours as needed for nausea or vomiting.     potassium chloride 10  MEQ tablet  Commonly known as:  K-DUR,KLOR-CON  Take 3 in the morning and two at bedtime.     potassium chloride 10 MEQ tablet  Commonly known as:  KLOR-CON 10  Take 1 tablet (10 mEq total) by mouth 2 (two) times daily. Taking 3 in the am and 2 in the pm.     PRESCRIPTION MEDICATION  Apply 1 application topically at bedtime. meloxicam 0.5/doxepin 3%/amantadine 3%/dextromethorphan 2%/Lidocaine 2%. Compounded prescription.  Apply at 8pm and repeat at 10pm if needed.     SYSTANE OP  Apply 2 drops to eye daily as needed (for dry eyes).     tamsulosin 0.4 MG Caps capsule  Commonly known as:  FLOMAX  Take 0.4 mg by mouth daily after breakfast.     traZODone 100 MG tablet  Commonly known as:  DESYREL  Take 1 tablet at bedtime     trimethoprim 100 MG tablet  Commonly known as:  TRIMPEX  1 tablet at bedtime.        Review of Systems   Current information is a copy of the previous note:  Increased sweating: Has had hot flashes and periodic sweating in the daytime  Did not tolerate propantheline. Had been slightly better with Climara 0.1 mg and subsequently  taking Vivelle which she stopped because of cost. Effexor was also tried but she did not tolerate this and also sweating was no better She was also tried on clonidine patches without much relief She was retried on hormone patches but she stopped this because he felt it was causing drowsiness She does not complain of excessive symptoms now  Genitourinary: Positive for difficulty urinating.       She was taking urecholine for her  neurogenic bladder  but stopped this because of  excessive salivation Neurological: Positive for numbness.       Has peripheral neuropathy, symptomatic of unclear etiology and followed by neurologist  She has had significant amount of neuropathic pain in her feet and legs and was not relieved with large doses of gabapentin. She is on Cymbalta and Trileptal and also local lidocaine   She has been anemic and is followed by PCP    Still having occasional diarrhea, followed by GI    Objective:   Physical Exam  BP 132/66 mmHg  Pulse 79  Temp(Src) 98.5 F (36.9 C) (Oral)  Ht 5' (1.524 m)  Wt 115 lb (52.164 kg)  BMI 22.46 kg/m2  SpO2 96%  Repeat blood pressure standing is 120/62  No pedal edema  Assessment:      Adrenal insufficiency: She has adrenal insufficiency as judged by her symptoms and very low cortisol levels on the stimulation test  She has been able take the injectable hydrocortisone without any side effects, previouslywas not able to tolerate oral supplementsbecause of GI side effects and insomnia. With taking the full physiological dose of 2 mg the morning and 10 mg the evenings she is subjectively feeling better However her appetite is not improved She does have mild nausea and not clear this could be related to gastroparesis   Orthostatic dysautonomic hypotension syndrome:   Her blood pressure appears to be well controlled with Florinef 2  tablets a day  Blood pressure is relatively consistent now at home    Plan:      No change in medications She will discuss evaluation of possible gastroparesis with gastroenterologist because of her autonomic neuropathy in other organ systems     [facility]

## 2014-04-03 NOTE — Patient Instructions (Signed)
Same doses

## 2014-04-11 ENCOUNTER — Telehealth: Payer: Self-pay | Admitting: Endocrinology

## 2014-04-11 NOTE — Telephone Encounter (Signed)
Please see below.

## 2014-04-11 NOTE — Telephone Encounter (Signed)
Patient stated that her med solu-cortef 100 mg, Mail order [employer] is charging her 266.46 for one vial, she can't afford that. when she use to pay 26.91 for three vials. Please advise

## 2014-04-13 NOTE — Telephone Encounter (Signed)
I have no idea how her insurance works. Needs to get it locally or ask her insurance Otherwise will need to go to pills

## 2014-04-14 NOTE — Telephone Encounter (Signed)
error 

## 2014-04-15 NOTE — Telephone Encounter (Signed)
No answer on cb

## 2014-04-16 ENCOUNTER — Telehealth: Payer: Self-pay | Admitting: Endocrinology

## 2014-04-16 NOTE — Telephone Encounter (Signed)
Line is busy, will call back later

## 2014-04-16 NOTE — Telephone Encounter (Signed)
Need to know specifically what symptoms she is having and how she contacted gastroenterologist about nausea

## 2014-04-16 NOTE — Telephone Encounter (Signed)
Please see below and advise.

## 2014-04-16 NOTE — Telephone Encounter (Signed)
Patient stated that she is fatigued and depressed and don't feel like doing anything, and nausea. Please advise

## 2014-04-16 NOTE — Telephone Encounter (Signed)
Please see below.

## 2014-04-16 NOTE — Telephone Encounter (Signed)
Patient stated that after taking the Cortisol Inj, first three day she felt good, but she has feeling bad since then Please advise

## 2014-04-16 NOTE — Telephone Encounter (Signed)
This is not from her adrenal gland problems.  She is to talk to her PCP and GI doctor I presume her blood pressure is okay

## 2014-04-17 ENCOUNTER — Telehealth: Payer: Self-pay | Admitting: Endocrinology

## 2014-04-17 NOTE — Telephone Encounter (Signed)
Patient called this afternoon stating she would like to know if increase or switch her hydrocortisone injections  Take 0.2 in the morning and 0.4 int he evening  Or 0.4 in morning and 0.4 in evening    Please advise patient    Thank you

## 2014-04-18 NOTE — Telephone Encounter (Signed)
Patient wants to know if she can take the 0.2 in the am and 0.4 in the pm since she feels so bad in the mornings.

## 2014-04-18 NOTE — Telephone Encounter (Signed)
Patient has seen her PCP already, she said she feels horrible in the mornings when she wakes up, she's sure it's from the cortisone injections and wants to know if she can take what is written below? Please advise.   cb [phone]

## 2014-04-18 NOTE — Telephone Encounter (Signed)
She is supposed to take 0.4 in the early morning and 0.2 in the early evenings

## 2014-04-18 NOTE — Telephone Encounter (Signed)
She can try switching the doses

## 2014-04-18 NOTE — Telephone Encounter (Signed)
She is aware 

## 2014-04-21 ENCOUNTER — Other Ambulatory Visit: Payer: Self-pay | Admitting: Neurological Surgery

## 2014-04-21 NOTE — Telephone Encounter (Signed)
error 

## 2014-04-23 ENCOUNTER — Other Ambulatory Visit (INDEPENDENT_AMBULATORY_CARE_PROVIDER_SITE_OTHER): Payer: PPO

## 2014-04-23 DIAGNOSIS — E2740 Unspecified adrenocortical insufficiency: Secondary | ICD-10-CM

## 2014-04-23 LAB — BASIC METABOLIC PANEL
BUN: 12 mg/dL (ref 6–23)
CALCIUM: 9.1 mg/dL (ref 8.4–10.5)
CO2: 36 mEq/L — ABNORMAL HIGH (ref 19–32)
Chloride: 102 mEq/L (ref 96–112)
Creatinine, Ser: 0.83 mg/dL (ref 0.40–1.20)
GFR: 72.97 mL/min (ref >60.00)
GLUCOSE: 141 mg/dL — AB (ref 70–99)
POTASSIUM: 4.3 meq/L (ref 3.5–5.1)
Sodium: 140 mEq/L (ref 135–145)

## 2014-04-25 ENCOUNTER — Other Ambulatory Visit: Payer: PPO

## 2014-04-28 ENCOUNTER — Encounter: Payer: Self-pay | Admitting: *Deleted

## 2014-04-28 ENCOUNTER — Other Ambulatory Visit: Payer: Self-pay | Admitting: *Deleted

## 2014-04-28 ENCOUNTER — Ambulatory Visit (INDEPENDENT_AMBULATORY_CARE_PROVIDER_SITE_OTHER): Payer: PPO | Admitting: Endocrinology

## 2014-04-28 VITALS — BP 180/85 | HR 68 | Temp 98.0°F | Resp 14 | Ht 60.0 in | Wt 117.4 lb

## 2014-04-28 DIAGNOSIS — R5383 Other fatigue: Secondary | ICD-10-CM

## 2014-04-28 DIAGNOSIS — R110 Nausea: Secondary | ICD-10-CM

## 2014-04-28 DIAGNOSIS — F329 Major depressive disorder, single episode, unspecified: Secondary | ICD-10-CM

## 2014-04-28 DIAGNOSIS — F32A Depression, unspecified: Secondary | ICD-10-CM

## 2014-04-28 DIAGNOSIS — G903 Multi-system degeneration of the autonomic nervous system: Secondary | ICD-10-CM

## 2014-04-28 DIAGNOSIS — E2740 Unspecified adrenocortical insufficiency: Secondary | ICD-10-CM

## 2014-04-28 DIAGNOSIS — I951 Orthostatic hypotension: Secondary | ICD-10-CM

## 2014-04-28 MED ORDER — HYDROCORTISONE NA SUCCINATE PF 100 MG IJ SOLR: INTRAMUSCULAR | Status: DC

## 2014-04-28 MED ORDER — "INSULIN SYRINGE-NEEDLE U-100 31G X 5/16"" 0.3 ML MISC": Status: DC

## 2014-04-28 NOTE — Progress Notes (Signed)
Patient ID: Katrina Rivera, female   DOB: 09/03/1945, 69 y.o.   MRN: [id number]   Subjective:     Chief complaint: Follow up of adrenal insufficiency   PAST history: She has had long-standing problems with orthostatic hypotension and also hyponatremia. Has been diagnosed with dysautonomia and has multiple other problems related to autonomic neuropathy.   She has been on Florinef since 2004 usually taking 3 tablets daily along with 5 tablets of potassium which had previously controlled her symptoms well . She had been previously stable  with the regimen of one tablet in the morning and 2 in the evening.  Because of persistent orthostatic symptoms she had been tried on midodrine on 05/28/12 but this was later stopped when blood pressure increased.  She  had medication adjustments done frequently over the last year for regulating her blood pressure.  RECENT history:  Adrenal Insufficiency:   This is likely secondary to pituitary dysfunction  Prior testing included Cortrosyn test baseline with stimulated level of 15.7, baseline 3.9.  Also confirmed by 24 hour urine free cortisol which was only 3.  Although previously she had tolerated oral hydrocortisone and small doses with improvement in her symptoms she did not continue this long-term She stopped her hydrocortisone in 4/15 because of concerns about weight gain and excessive bruising, abdominal discomfort and increased appetite.  She was again symptomatic with weight loss, decreased appetite, nausea and also diarrhea Cortrosyn stimulation test on 03/12/14 showed baseline cortisol level of 0.3 and post injection of 1.1 only  She was tried on oral hydrocortisone again and she felt that this makes her feel nauseous, affecting her sleep as well as concentration and causes her to be more tired.   She has been started on hydrocortisone injections using insulin syringe and her husband has been trained in doing this.   Currently on the equivalent of  30 mg hydrocortisone daily She said she did not feel good with her regimen and wanted to try taking the higher dose in the evening but now she does not think this makes her feel any better  She is taking 0.4 mL to get her 20 mg dose and 0.2 mL to get the 10 mg dose With increasing her dose on 03/20/14 she has started to feel better with her energy level  However now she thinks she is feeling really bad and gets tired as well as having nausea However has gained some weight She can eat certain foods especially when going out  Wt Readings from Last 3 Encounters:  04/28/14 117 lb 6.4 oz (53.252 kg)  04/03/14 115 lb (52.164 kg)  03/20/14 116 lb 9.6 oz (52.889 kg)    AUTONOMIC dysfunction:  She is on Florinef 2 tablets a day  now.   She has no lightheadedness when standing up She has checked her blood pressure more regularly and the last few systolic readings standing have been systolic 144-152 and standing 409-811, overall higher sitting readings lately Her potassium has been normal with taking 3 potassium tablets   General Endocrinology:    Pituitary: She has had a low free T4  but did not tolerate thyroxine supplementation and free T4 has been variable Free T4 is consistently normal now although the last level is low normal  Has not had any abnormalities of the pituitary on previous MRI   LABS:  Appointment on 04/23/2014  Component Date Value Ref Range Status  . Sodium 04/23/2014 140  135 - 145 mEq/L Final  . Potassium  04/23/2014 4.3  3.5 - 5.1 mEq/L Final  . Chloride 04/23/2014 102  96 - 112 mEq/L Final  . CO2 04/23/2014 36* 19 - 32 mEq/L Final  . Glucose, Bld 04/23/2014 141* 70 - 99 mg/dL Final  . BUN 96/05/5407 12  6 - 23 mg/dL Final  . Creatinine, Ser 04/23/2014 0.83  0.40 - 1.20 mg/dL Final  . Calcium 81/19/1478 9.1  8.4 - 10.5 mg/dL Final  . GFR 29/56/2130 72.97  >60.00 mL/min Final          Medication List       This list is accurate as of: 04/28/14  9:14 AM.   Always use your most recent med list.               ALPRAZolam 0.5 MG tablet  Commonly known as:  XANAX  2 tablets at bedtime.     bifidobacterium infantis capsule  Take 1 capsule by mouth daily.     calcium-vitamin D 500-200 MG-UNIT per tablet  Commonly known as:  OSCAL WITH D  Take 1 tablet by mouth 2 (two) times daily.     cetirizine 10 MG tablet  Commonly known as:  ZYRTEC  Take 10 mg by mouth daily.     CORTEF 10 MG tablet  Generic drug:  hydrocortisone  Take 1 tablet (10 mg total) by mouth daily.     docusate sodium 100 MG capsule  Commonly known as:  COLACE  Take 100 mg by mouth 2 (two) times daily.     DULoxetine 30 MG capsule  Commonly known as:  CYMBALTA  Take 1 capsule (30 mg total) by mouth daily.     fentaNYL 50 MCG/HR  Commonly known as:  DURAGESIC - dosed mcg/hr  Place 1 patch (50 mcg total) onto the skin every 3 (three) days.     Ferrous Sulfate Dried 200 (65 FE) MG Tabs  Take 65 mg by mouth daily.     Fish Oil 1200 MG Caps  Take 2 capsules by mouth daily.     fludrocortisone 0.1 MG tablet  Commonly known as:  FLORINEF  One tablet in the morning and two tablets at night.     fluticasone 50 MCG/ACT nasal spray  Commonly known as:  FLONASE  Place 2 sprays into both nostrils at bedtime as needed for allergies or rhinitis.     HYDROcodone-acetaminophen 5-325 MG per tablet  Commonly known as:  NORCO/VICODIN     hydrocortisone sodium succinate 100 MG Solr injection  Commonly known as:  SOLU-CORTEF  Inject  0.4 mls every morning and 0.94mls every evening.     ibuprofen 200 MG tablet  Commonly known as:  ADVIL,MOTRIN  Take 400 mg by mouth 2 (two) times daily as needed (pain).     multivitamin with minerals Tabs tablet  Take 1 tablet by mouth daily.     omeprazole 40 MG capsule  Commonly known as:  PRILOSEC     ondansetron 4 MG tablet  Commonly known as:  ZOFRAN  Take 1 tablet (4 mg total) by mouth every 8 (eight) hours as needed for nausea  or vomiting.     potassium chloride 10 MEQ tablet  Commonly known as:  K-DUR,KLOR-CON  Take 3 in the morning and two at bedtime.     potassium chloride 10 MEQ tablet  Commonly known as:  KLOR-CON 10  Take 1 tablet (10 mEq total) by mouth 2 (two) times daily. Taking 3 in the am and 2 in the  pm.     PRESCRIPTION MEDICATION  Apply 1 application topically at bedtime. meloxicam 0.5/doxepin 3%/amantadine 3%/dextromethorphan 2%/Lidocaine 2%. Compounded prescription.  Apply at 8pm and repeat at 10pm if needed.     SYSTANE OP  Apply 2 drops to eye daily as needed (for dry eyes).     tamsulosin 0.4 MG Caps capsule  Commonly known as:  FLOMAX  Take 0.4 mg by mouth daily after breakfast.     traZODone 100 MG tablet  Commonly known as:  DESYREL  Take 1 tablet at bedtime     trimethoprim 100 MG tablet  Commonly known as:  TRIMPEX  1 tablet at bedtime.        Review of Systems   She is complaining of increased thirst now.  Also complaining of increased anxiety and depression and still having some insomnia  Current information is a copy of the previous note:  Increased sweating: Has had hot flashes and periodic sweating in the daytime  Did not tolerate propantheline. Had been slightly better with Climara 0.1 mg and subsequently  taking Vivelle which she stopped because of cost. Effexor was also tried but she did not tolerate this and also sweating was no better She was also tried on clonidine patches without much relief She was retried on hormone patches but she stopped this because he felt it was causing drowsiness She does not complain of excessive symptoms now  Genitourinary: Positive for difficulty urinating.       She was taking urecholine for her  neurogenic bladder  but stopped this because of excessive salivation Neurological: Positive for numbness.       Has peripheral neuropathy, symptomatic of unclear etiology and followed by neurologist  She has had significant amount of  neuropathic pain in her feet and legs and was not relieved with large doses of gabapentin.  She is on Cymbalta and Trileptal and also local lidocaine   She has been anemic and is followed by PCP    Still having occasional diarrhea, followed by GI    Objective:   Physical Exam  BP 156/78 mmHg  Pulse 68  Temp(Src) 98 F (36.7 C)  Resp 14  Ht 5' (1.524 m)  Wt 117 lb 6.4 oz (53.252 kg)  BMI 22.93 kg/m2  SpO2 96%  Repeat blood pressure standing is 180/85  No pedal edema  Assessment:      Adrenal insufficiency: She has secondary adrenal insufficiency as judged by her symptoms and very low cortisol levels on the stimulation test  She has been able take the injectable hydrocortisone without any side effects and although she initially started feeling better with her energy level she is still complaining of nonspecific symptoms of anxiety, depression, fatigue and nausea Has gained some weight She does not feel any different with taking the higher dose of the hydrocortisone in the evening compared to the morning   Orthostatic dysautonomic hypotension syndrome:   Her blood pressure appears to be now higher with Florinef 2  tablets a day  Blood pressure is somewhat high at home also at least sitting Potassium has been stable  NAUSEA: She has persistent nausea and will need to rule out gastroparesis.  Has not set up a follow-up with her gastroenterologist She does not think Cymbalta has caused nausea previously  DEPRESSION/anxiety: She is having more symptoms and will need adjustment of her medications; discussed that she may take Lantus during the day as needed for anxiety   Plan:  She can try reducing her hydrocortisone to 15 mg in the morning and take 10 mg the evening as before Reduce Florinef to 1 tablet daily She will be scheduled for gastric emptying study She needs to discuss her anxiety and depression with PCP Given the low to her for her travel to take injectable  medications with her     [facility]

## 2014-04-28 NOTE — Patient Instructions (Signed)
Injection dose 0.22ml in am and 0.2 in pm  Florinef 1 daily at night

## 2014-04-29 ENCOUNTER — Telehealth: Payer: Self-pay | Admitting: *Deleted

## 2014-04-29 NOTE — Telephone Encounter (Signed)
She needs to discuss anxiety and nausea medications with PCP who is prescribing these.  I have ordered a gastric emptying study which is a nuclear medicine test that the radiology department

## 2014-04-29 NOTE — Telephone Encounter (Signed)
Patient wants to know if you can refill her zofran, she's feeling very nauseous, She also said she's on lorazepam, but it does not help with her anxiety and she just feels raw, can you give her something else? Also she was wanting to know about a test you discussed with her, an H Pylori test?  Please advise

## 2014-04-30 ENCOUNTER — Telehealth: Payer: Self-pay | Admitting: Endocrinology

## 2014-04-30 ENCOUNTER — Encounter (INDEPENDENT_AMBULATORY_CARE_PROVIDER_SITE_OTHER): Payer: PPO | Admitting: Ophthalmology

## 2014-04-30 ENCOUNTER — Other Ambulatory Visit: Payer: Self-pay | Admitting: *Deleted

## 2014-04-30 ENCOUNTER — Other Ambulatory Visit: Payer: Self-pay | Admitting: Endocrinology

## 2014-04-30 DIAGNOSIS — H35373 Puckering of macula, bilateral: Secondary | ICD-10-CM | POA: Diagnosis not present

## 2014-04-30 DIAGNOSIS — H43813 Vitreous degeneration, bilateral: Secondary | ICD-10-CM | POA: Diagnosis not present

## 2014-04-30 MED ORDER — ONDANSETRON HCL 4 MG PO TABS: ORAL_TABLET | ORAL | Status: DC

## 2014-04-30 NOTE — Telephone Encounter (Signed)
Noted, patient is aware to contact PCP, radiology called her yesterday to set up the test.

## 2014-04-30 NOTE — Telephone Encounter (Signed)
rx sent for zofran

## 2014-04-30 NOTE — Telephone Encounter (Signed)
Pt returning call she was in MD office.  She would like to address the med for the nausea-she needs refills and is going on a trip

## 2014-05-01 ENCOUNTER — Other Ambulatory Visit: Payer: PPO

## 2014-05-06 ENCOUNTER — Ambulatory Visit (HOSPITAL_COMMUNITY)
Admission: RE | Admit: 2014-05-06 | Discharge: 2014-05-06 | Disposition: A | Discharge status: Home / Self Care | Payer: PPO | Source: Ambulatory Visit | Attending: Endocrinology | Admitting: Endocrinology

## 2014-05-06 DIAGNOSIS — R110 Nausea: Secondary | ICD-10-CM

## 2014-05-06 DIAGNOSIS — R112 Nausea with vomiting, unspecified: Secondary | ICD-10-CM | POA: Insufficient documentation

## 2014-05-06 MED ORDER — TECHNETIUM TC 99M SULFUR COLLOID
2.0000 | Freq: Once | INTRAVENOUS | Status: AC | PRN
  Administered 2014-05-06: 2 via INTRAVENOUS

## 2014-05-07 ENCOUNTER — Ambulatory Visit: Payer: PPO | Admitting: Endocrinology

## 2014-05-07 NOTE — Progress Notes (Signed)
Quick Note:  Please let patient know that the result is normal and no further action needed ______ 

## 2014-05-08 ENCOUNTER — Inpatient Hospital Stay (HOSPITAL_COMMUNITY)
Admission: RE | Admit: 2014-05-08 | Discharge: 2014-05-08 | Disposition: A | Discharge status: Home / Self Care | Payer: PPO | Source: Ambulatory Visit

## 2014-05-26 ENCOUNTER — Telehealth: Payer: Self-pay | Admitting: Neurology

## 2014-05-26 MED ORDER — FENTANYL 50 MCG/HR TD PT72: 50.0000 ug | MEDICATED_PATCH | TRANSDERMAL | Status: DC

## 2014-05-26 NOTE — Telephone Encounter (Signed)
Pt is calling requesting a written Rx for fentaNYL (DURAGESIC - DOSED MCG/HR) 50 MCG/HR. Please call when ready for pick up.

## 2014-05-26 NOTE — Telephone Encounter (Signed)
Dr Yan is out of the office.  Request entered, forwarded to WID for review.   

## 2014-05-28 ENCOUNTER — Other Ambulatory Visit: Payer: Self-pay

## 2014-05-28 ENCOUNTER — Telehealth: Payer: Self-pay | Admitting: *Deleted

## 2014-05-28 MED ORDER — FENTANYL 50 MCG/HR TD PT72: 50.0000 ug | MEDICATED_PATCH | TRANSDERMAL | Status: DC

## 2014-05-28 NOTE — Telephone Encounter (Signed)
Message sent to clinic for follow up  

## 2014-05-28 NOTE — Telephone Encounter (Signed)
Patient checking status of Rx fentaNYL (DURAGESIC - DOSED MCG/HR) 50 MCG/HR.  Relayed telephone message from 4/18 regarding WID has request.  Please call and advise

## 2014-05-28 NOTE — Telephone Encounter (Signed)
Left message at home and cell numbers that rx is ready for pick up.

## 2014-06-09 ENCOUNTER — Encounter (HOSPITAL_COMMUNITY)
Admission: RE | Admit: 2014-06-09 | Discharge: 2014-06-09 | Disposition: A | Discharge status: Home / Self Care | Payer: PPO | Source: Ambulatory Visit | Attending: Neurological Surgery | Admitting: Neurological Surgery

## 2014-06-09 ENCOUNTER — Encounter (HOSPITAL_COMMUNITY): Payer: Self-pay

## 2014-06-09 ENCOUNTER — Other Ambulatory Visit (INDEPENDENT_AMBULATORY_CARE_PROVIDER_SITE_OTHER): Payer: PPO

## 2014-06-09 ENCOUNTER — Ambulatory Visit (HOSPITAL_COMMUNITY)
Admission: RE | Admit: 2014-06-09 | Discharge: 2014-06-09 | Disposition: A | Discharge status: Home / Self Care | Payer: PPO | Source: Ambulatory Visit | Attending: Neurological Surgery | Admitting: Neurological Surgery

## 2014-06-09 DIAGNOSIS — M48061 Spinal stenosis, lumbar region without neurogenic claudication: Secondary | ICD-10-CM

## 2014-06-09 DIAGNOSIS — Z01812 Encounter for preprocedural laboratory examination: Secondary | ICD-10-CM | POA: Diagnosis not present

## 2014-06-09 DIAGNOSIS — E2740 Unspecified adrenocortical insufficiency: Secondary | ICD-10-CM | POA: Diagnosis not present

## 2014-06-09 DIAGNOSIS — G35 Multiple sclerosis: Secondary | ICD-10-CM | POA: Diagnosis not present

## 2014-06-09 DIAGNOSIS — R5383 Other fatigue: Secondary | ICD-10-CM | POA: Diagnosis not present

## 2014-06-09 DIAGNOSIS — Z01810 Encounter for preprocedural cardiovascular examination: Secondary | ICD-10-CM | POA: Diagnosis not present

## 2014-06-09 DIAGNOSIS — Z01818 Encounter for other preprocedural examination: Secondary | ICD-10-CM | POA: Diagnosis not present

## 2014-06-09 DIAGNOSIS — M4806 Spinal stenosis, lumbar region: Secondary | ICD-10-CM | POA: Diagnosis not present

## 2014-06-09 HISTORY — DX: Polyneuropathy, unspecified: G62.9

## 2014-06-09 HISTORY — DX: Personal history of other medical treatment: Z92.89

## 2014-06-09 HISTORY — DX: Dizziness and giddiness: R42

## 2014-06-09 HISTORY — DX: Other chronic pain: G89.29

## 2014-06-09 HISTORY — DX: Weakness: R53.1

## 2014-06-09 HISTORY — DX: Insomnia, unspecified: G47.00

## 2014-06-09 HISTORY — DX: Dorsalgia, unspecified: M54.9

## 2014-06-09 HISTORY — DX: Frequency of micturition: R35.0

## 2014-06-09 HISTORY — DX: Nocturia: R35.1

## 2014-06-09 HISTORY — DX: Other seasonal allergic rhinitis: J30.2

## 2014-06-09 HISTORY — DX: Irritable bowel syndrome, unspecified: K58.9

## 2014-06-09 HISTORY — DX: Hypokalemia: E87.6

## 2014-06-09 HISTORY — DX: Primary adrenocortical insufficiency: E27.1

## 2014-06-09 HISTORY — DX: Pain in unspecified joint: M25.50

## 2014-06-09 HISTORY — DX: Personal history of other diseases of the respiratory system: Z87.09

## 2014-06-09 LAB — TYPE AND SCREEN
ABO/RH(D): A POS
Antibody Screen: NEGATIVE

## 2014-06-09 LAB — CBC WITH DIFFERENTIAL/PLATELET
BASOS ABS: 0 10*3/uL (ref 0.0–0.1)
BASOS PCT: 0 % (ref 0–1)
EOS PCT: 1 % (ref 0–5)
Eosinophils Absolute: 0.1 10*3/uL (ref 0.0–0.7)
HCT: 39.7 % (ref 36.0–46.0)
Hemoglobin: 12.7 g/dL (ref 12.0–15.0)
LYMPHS ABS: 2 10*3/uL (ref 0.7–4.0)
Lymphocytes Relative: 24 % (ref 12–46)
MCH: 30.1 pg (ref 26.0–34.0)
MCHC: 32 g/dL (ref 30.0–36.0)
MCV: 94.1 fL (ref 78.0–100.0)
MONO ABS: 0.4 10*3/uL (ref 0.1–1.0)
MONOS PCT: 5 % (ref 3–12)
Neutro Abs: 5.8 10*3/uL (ref 1.7–7.7)
Neutrophils Relative %: 70 % (ref 43–77)
Platelets: 224 10*3/uL (ref 150–400)
RBC: 4.22 MIL/uL (ref 3.87–5.11)
RDW: 12.5 % (ref 11.5–15.5)
WBC: 8.2 10*3/uL (ref 4.0–10.5)

## 2014-06-09 LAB — SURGICAL PCR SCREEN
MRSA, PCR: NEGATIVE
Staphylococcus aureus: NEGATIVE

## 2014-06-09 LAB — BASIC METABOLIC PANEL
BUN: 15 mg/dL (ref 6–23)
CO2: 22 mEq/L (ref 19–32)
Calcium: 8.9 mg/dL (ref 8.4–10.5)
Chloride: 102 mEq/L (ref 96–112)
Creatinine, Ser: 0.89 mg/dL (ref 0.40–1.20)
GFR: 67.3 mL/min (ref >60.00)
Glucose, Bld: 93 mg/dL (ref 70–99)
POTASSIUM: 4.2 meq/L (ref 3.5–5.1)
Sodium: 135 mEq/L (ref 135–145)

## 2014-06-09 LAB — PROTIME-INR
INR: 0.94 (ref 0.00–1.49)
Prothrombin Time: 12.6 seconds (ref 11.6–15.2)

## 2014-06-09 LAB — TSH: TSH: 2.75 u[IU]/mL (ref 0.35–4.50)

## 2014-06-09 LAB — T4, FREE: FREE T4: 0.64 ng/dL (ref 0.60–1.60)

## 2014-06-09 NOTE — Progress Notes (Signed)
Pt doesn't have a cardiologist  Denies ever having an echo/stress test/heart cath  Denies EKG or CXR in past yr  Medical Md is Dr.James Holley Bouche

## 2014-06-09 NOTE — Pre-Procedure Instructions (Signed)
Katrina Rivera  06/09/2014   Your procedure is scheduled on:  Wed, May 11 @ 9:30 AM  Report to [facility]  at  7:30 AM.  Call this number if you have problems the morning of surgery: [phone]   Remember:   Do not eat food or drink liquids after midnight.   Take these medicines the morning of surgery with A SIP OF WATER: Align(Bifidobacterium),Zytrec(Cetirizine),Cymbalta(Duloxetine),Pain Patch,Flonase(Fluticasone),Omeprazole(Prilosec),Zofran(Ondansetron), and Flomax(Tamsulosin)   Do not wear jewelry, make-up or nail polish.  Do not wear lotions, powders, or perfumes. You may wear deodorant.  Do not shave 48 hours prior to surgery.   Do not bring valuables to the hospital.  [facility] is not responsible                  for any belongings or valuables.               Contacts, dentures or bridgework may not be worn into surgery.  Leave suitcase in the car. After surgery it may be brought to your room.  For patients admitted to the hospital, discharge time is determined by your                treatment team.              Special Instructions:  [place] - Preparing for Surgery  Before surgery, you can play an important role.  Because skin is not sterile, your skin needs to be as free of germs as possible.  You can reduce the number of germs on you skin by washing with CHG (chlorahexidine gluconate) soap before surgery.  CHG is an antiseptic cleaner which kills germs and bonds with the skin to continue killing germs even after washing.  Please DO NOT use if you have an allergy to CHG or antibacterial soaps.  If your skin becomes reddened/irritated stop using the CHG and inform your nurse when you arrive at Short Stay.  Do not shave (including legs and underarms) for at least 48 hours prior to the first CHG shower.  You may shave your face.  Please follow these instructions carefully:   1.  Shower with CHG Soap the night before surgery and the                                 morning of Surgery.  2.  If you choose to wash your hair, wash your hair first as usual with your       normal shampoo.  3.  After you shampoo, rinse your hair and body thoroughly to remove the                      Shampoo.  4.  Use CHG as you would any other liquid soap.  You can apply chg directly       to the skin and wash gently with scrungie or a clean washcloth.  5.  Apply the CHG Soap to your body ONLY FROM THE NECK DOWN.        Do not use on open wounds or open sores.  Avoid contact with your eyes,       ears, mouth and genitals (private parts).  Wash genitals (private parts)       with your normal soap.  6.  Wash thoroughly, paying special attention to the area where your surgery  will be performed.  7.  Thoroughly rinse your body with warm water from the neck down.  8.  DO NOT shower/wash with your normal soap after using and rinsing off       the CHG Soap.  9.  Pat yourself dry with a clean towel.            10.  Wear clean pajamas.            11.  Place clean sheets on your bed the night of your first shower and do not        sleep with pets.  Day of Surgery  Do not apply any lotions/deoderants the morning of surgery.  Please wear clean clothes to the hospital/surgery center.     Please read over the following fact sheets that you were given: Pain Booklet, Coughing and Deep Breathing, Blood Transfusion Information, MRSA Information and Surgical Site Infection Prevention

## 2014-06-12 ENCOUNTER — Encounter: Payer: Self-pay | Admitting: Endocrinology

## 2014-06-12 ENCOUNTER — Ambulatory Visit (INDEPENDENT_AMBULATORY_CARE_PROVIDER_SITE_OTHER): Payer: PPO | Admitting: Endocrinology

## 2014-06-12 VITALS — BP 104/52 | HR 83 | Temp 98.0°F | Resp 14 | Ht 60.0 in | Wt 123.6 lb

## 2014-06-12 DIAGNOSIS — R5382 Chronic fatigue, unspecified: Secondary | ICD-10-CM | POA: Diagnosis not present

## 2014-06-12 DIAGNOSIS — E2740 Unspecified adrenocortical insufficiency: Secondary | ICD-10-CM

## 2014-06-12 NOTE — Progress Notes (Signed)
Patient ID: Katrina Rivera, female   DOB: [birth year], 69 y.o.   MRN: [id number]   Subjective:     Chief complaint: Fatigue   PAST history: She has had long-standing problems with orthostatic hypotension and also hyponatremia. Has been diagnosed with dysautonomia and has multiple other problems related to autonomic neuropathy.   She has been on Florinef since 2004 previously taking 3 tablets daily along with 5 tablets of potassium which had previously controlled her symptoms well . Because of persistent orthostatic symptoms she had been tried on midodrine on 05/28/12 but this was later stopped when blood pressure increased.  She  had medication adjustments done frequently over the last year for regulating her blood pressure.  RECENT history:  Adrenal Insufficiency:   This is secondary to pituitary dysfunction  Prior testing included Cortrosyn test baseline with stimulated level of 15.7, baseline 3.9.  Also confirmed by 24 hour urine free cortisol which was only 3.  Although previously she had tolerated oral hydrocortisone and small doses with improvement in her symptoms she did not continue this long-term She stopped her hydrocortisone in 4/15 because of concerns about weight gain and excessive bruising, abdominal discomfort and increased appetite.  She was again symptomatic with weight loss, decreased appetite, nausea and also diarrhea Cortrosyn stimulation test on 03/12/14 showed baseline cortisol level of 0.3 and post injection of 1.1 only  She was tried on oral hydrocortisone again and she felt that this makes her feel nauseous, affecting her sleep as well as concentration and causes her to be more tired.   She has been started on hydrocortisone injections using insulin syringe and her husband has been doing this. Even with the injections she complains of feeling very tired.  She has tried switching the doses the higher dose in the evening and also even with trying to reduce the dose she  does not feel any different Currently on the equivalent of 40 mg hydrocortisone daily, taking 20 mg in the morning and 10 in the evening.  She is taking 0.4 mL to get her 20 mg dose and 0.2 mL to get the 10 mg dose  However has gained some weight which he thinks may be from her vacation and eating more chocolates   Wt Readings from Last 3 Encounters:  06/12/14 123 lb 9.6 oz (56.065 kg)  06/09/14 122 lb 2.2 oz (55.4 kg)  04/28/14 117 lb 6.4 oz (53.252 kg)    AUTONOMIC dysfunction:  She is only on Florinef 1 tablet a day  now.  Appears to be requiring less Florinef with starting hydrocortisone  She has no lightheadedness when standing up She has checked her blood pressure  regularly and the last few systolic readings standing have been about 120 and dropping about 10-20 mm on standing up Her potassium has been normal with taking 4 potassium tablets   General Endocrinology:    Pituitary: She has had a low normal free T4  but did not tolerate thyroxine supplementation in the past  Has not had any abnormalities of the pituitary on previous MRI           Medication List       This list is accurate as of: 06/12/14  8:09 AM.  Always use your most recent med list.               ALPRAZolam 1 MG tablet  Commonly known as:  XANAX  Take 1 mg by mouth at bedtime.     bifidobacterium infantis  capsule  Take 1 capsule by mouth daily.     calcium-vitamin D 500-200 MG-UNIT per tablet  Commonly known as:  OSCAL WITH D  Take 1 tablet by mouth 2 (two) times daily.     cetirizine 10 MG tablet  Commonly known as:  ZYRTEC  Take 10 mg by mouth daily.     CORTEF 10 MG tablet  Generic drug:  hydrocortisone  Take 1 tablet (10 mg total) by mouth daily.     docusate sodium 100 MG capsule  Commonly known as:  COLACE  Take 100 mg by mouth 2 (two) times daily.     DULoxetine 30 MG capsule  Commonly known as:  CYMBALTA  Take 1 capsule (30 mg total) by mouth daily.     fentaNYL 50  MCG/HR  Commonly known as:  DURAGESIC - dosed mcg/hr  Place 1 patch (50 mcg total) onto the skin every 3 (three) days.     Ferrous Sulfate Dried 200 (65 FE) MG Tabs  Take 65 mg by mouth daily.     Fish Oil 1200 MG Caps  Take 2 capsules by mouth daily.     fludrocortisone 0.1 MG tablet  Commonly known as:  FLORINEF  One tablet in the morning and two tablets at night.     fluticasone 50 MCG/ACT nasal spray  Commonly known as:  FLONASE  Place 2 sprays into both nostrils at bedtime as needed for allergies or rhinitis.     hydrocortisone sodium succinate 100 MG Solr injection  Commonly known as:  SOLU-CORTEF  Inject  0.4 mls every morning and 0.65mls every evening.     ibuprofen 200 MG tablet  Commonly known as:  ADVIL,MOTRIN  Take 400 mg by mouth 2 (two) times daily as needed (pain).     Insulin Syringe-Needle U-100 31G X 5/16" 0.3 ML Misc  Commonly known as:  B-D INSULIN SYRINGE  Use 2 per day     multivitamin with minerals Tabs tablet  Take 1 tablet by mouth daily.     omeprazole 40 MG capsule  Commonly known as:  PRILOSEC  Take 40 mg by mouth daily.     ondansetron 4 MG tablet  Commonly known as:  ZOFRAN  Take 1 tablet every 4 hours as needed for nausea or vomiting.     potassium chloride SA 20 MEQ tablet  Commonly known as:  K-DUR,KLOR-CON  Take 20 mEq by mouth 2 (two) times daily.     potassium chloride 10 MEQ tablet  Commonly known as:  K-DUR,KLOR-CON  Take 3 in the morning and two at bedtime.     potassium chloride 10 MEQ tablet  Commonly known as:  KLOR-CON 10  Take 1 tablet (10 mEq total) by mouth 2 (two) times daily. Taking 3 in the am and 2 in the pm.     SYSTANE OP  Apply 2 drops to eye daily as needed (for dry eyes).     tamsulosin 0.4 MG Caps capsule  Commonly known as:  FLOMAX  Take 0.4 mg by mouth daily after breakfast.     traZODone 150 MG tablet  Commonly known as:  DESYREL  Take 150 mg by mouth at bedtime.     traZODone 100 MG tablet   Commonly known as:  DESYREL  Take 1 tablet at bedtime     trimethoprim 100 MG tablet  Commonly known as:  TRIMPEX  Take 100 mg by mouth at bedtime.        Review  of Systems   Still has increased anxiety and depression and still having some insomnia  Current information is a copy of the previous note:  Increased sweating: Has had hot flashes and periodic sweating in the daytime  Did not tolerate propantheline. Had been slightly better with Climara 0.1 mg and subsequently  taking Vivelle which she stopped because of cost. Effexor was also tried but she did not tolerate this and also sweating was no better She was also tried on clonidine patches without much relief She was retried on hormone patches but she stopped this because he felt it was causing drowsiness She does not complain of excessive symptoms now  Genitourinary: Positive for difficulty urinating.       She was taking urecholine for her  neurogenic bladder  but stopped this because of excessive salivation Neurological: Positive for numbness.       Has peripheral neuropathy, symptomatic of unclear etiology and followed by neurologist  She has had significant amount of neuropathic pain in her feet and legs and was not relieved with large doses of gabapentin.  She is on Cymbalta and Trileptal and also local lidocaine     Still having occasional diarrhea, followed by GI    Objective:   Physical Exam  BP 104/52 mmHg  Pulse 83  Temp(Src) 98 F (36.7 C)  Resp 14  Ht 5' (1.524 m)  Wt 123 lb 9.6 oz (56.065 kg)  BMI 24.14 kg/m2  SpO2 96%  Repeat blood pressure standing is  110/62  No pedal edema  Assessment:      Adrenal insufficiency: 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 and very low cortisol levels on the stimulation test  She has been able take the injectable hydrocortisone without any side effects and although she initially started feeling better with her energy level she is  still complaining of nonspecific symptoms of significant fatigability However he is apprehensive about her upcoming back surgery and has difficulty sleeping Has gained some weight and does not complain of nausea as much She does not feel any different with taking the higher dose of the hydrocortisone in the evening compared to the morning   Orthostatic dysautonomic hypotension syndrome:   Her blood pressure appears well controlled with 1 Florinef tablet a day now  Potassium has been stable   Plan:      She can continue 20 mg of hydrocortisone in the morning by injection in 10 mg the evening Discussed with the patient that since this is not identical to endogenous cortisol we cannot measure her blood levels to assess the dose She will need high-dose cortisone stress doses right before, during and after her surgery.  Fatigue: This is partly related to chronic pain as well as insomnia but also not clear how to explain her symptoms She will follow-up with her PCP for this      [facility]

## 2014-06-12 NOTE — Patient Instructions (Signed)
Inject in buttock

## 2014-06-17 MED ORDER — DEXAMETHASONE SODIUM PHOSPHATE 10 MG/ML IJ SOLN
10.0000 mg | INTRAMUSCULAR | Status: AC
  Administered 2014-06-18: 10 mg via INTRAVENOUS
  Filled 2014-06-17: qty 1

## 2014-06-17 MED ORDER — CEFAZOLIN SODIUM-DEXTROSE 2-3 GM-% IV SOLR
2.0000 g | INTRAVENOUS | Status: AC
  Administered 2014-06-18: 2 g via INTRAVENOUS
  Filled 2014-06-17: qty 50

## 2014-06-18 ENCOUNTER — Inpatient Hospital Stay (HOSPITAL_COMMUNITY): Payer: PPO

## 2014-06-18 ENCOUNTER — Inpatient Hospital Stay (HOSPITAL_COMMUNITY)
Admission: RE | Admit: 2014-06-18 | Discharge: 2014-06-19 | DRG: 460 | Disposition: A | Discharge status: Home / Self Care | Payer: PPO | Source: Ambulatory Visit | Attending: Neurological Surgery | Admitting: Neurological Surgery

## 2014-06-18 ENCOUNTER — Inpatient Hospital Stay (HOSPITAL_COMMUNITY): Payer: PPO | Admitting: Anesthesiology

## 2014-06-18 ENCOUNTER — Encounter (HOSPITAL_COMMUNITY)
Admission: RE | Disposition: A | Discharge status: Home / Self Care | Payer: Self-pay | Source: Ambulatory Visit | Attending: Neurological Surgery

## 2014-06-18 ENCOUNTER — Encounter (HOSPITAL_COMMUNITY): Payer: Self-pay | Admitting: *Deleted

## 2014-06-18 DIAGNOSIS — K589 Irritable bowel syndrome without diarrhea: Secondary | ICD-10-CM | POA: Diagnosis present

## 2014-06-18 DIAGNOSIS — G2581 Restless legs syndrome: Secondary | ICD-10-CM | POA: Diagnosis present

## 2014-06-18 DIAGNOSIS — D649 Anemia, unspecified: Secondary | ICD-10-CM | POA: Diagnosis present

## 2014-06-18 DIAGNOSIS — E876 Hypokalemia: Secondary | ICD-10-CM | POA: Diagnosis present

## 2014-06-18 DIAGNOSIS — Z419 Encounter for procedure for purposes other than remedying health state, unspecified: Secondary | ICD-10-CM

## 2014-06-18 DIAGNOSIS — M4807 Spinal stenosis, lumbosacral region: Secondary | ICD-10-CM | POA: Diagnosis present

## 2014-06-18 DIAGNOSIS — M797 Fibromyalgia: Secondary | ICD-10-CM | POA: Diagnosis present

## 2014-06-18 DIAGNOSIS — M810 Age-related osteoporosis without current pathological fracture: Secondary | ICD-10-CM | POA: Diagnosis present

## 2014-06-18 DIAGNOSIS — G35 Multiple sclerosis: Secondary | ICD-10-CM | POA: Diagnosis present

## 2014-06-18 DIAGNOSIS — E271 Primary adrenocortical insufficiency: Secondary | ICD-10-CM | POA: Diagnosis present

## 2014-06-18 DIAGNOSIS — F419 Anxiety disorder, unspecified: Secondary | ICD-10-CM | POA: Diagnosis present

## 2014-06-18 DIAGNOSIS — G4700 Insomnia, unspecified: Secondary | ICD-10-CM | POA: Diagnosis present

## 2014-06-18 DIAGNOSIS — F329 Major depressive disorder, single episode, unspecified: Secondary | ICD-10-CM | POA: Diagnosis present

## 2014-06-18 DIAGNOSIS — M549 Dorsalgia, unspecified: Secondary | ICD-10-CM | POA: Diagnosis present

## 2014-06-18 DIAGNOSIS — Z981 Arthrodesis status: Secondary | ICD-10-CM

## 2014-06-18 DIAGNOSIS — G629 Polyneuropathy, unspecified: Secondary | ICD-10-CM | POA: Diagnosis present

## 2014-06-18 DIAGNOSIS — M5417 Radiculopathy, lumbosacral region: Secondary | ICD-10-CM | POA: Diagnosis present

## 2014-06-18 HISTORY — PX: MAXIMUM ACCESS (MAS)POSTERIOR LUMBAR INTERBODY FUSION (PLIF) 1 LEVEL: SHX6368

## 2014-06-18 SURGERY — FOR MAXIMUM ACCESS (MAS) POSTERIOR LUMBAR INTERBODY FUSION (PLIF) 1 LEVEL
Anesthesia: General | Site: Back

## 2014-06-18 MED ORDER — HYDROCORTISONE 10 MG PO TABS: 10.0000 mg | ORAL_TABLET | Freq: Every day | ORAL | Status: DC

## 2014-06-18 MED ORDER — LACTATED RINGERS IV SOLN
INTRAVENOUS | Status: DC
  Administered 2014-06-18: 08:00:00 via INTRAVENOUS

## 2014-06-18 MED ORDER — ONDANSETRON HCL 4 MG/2ML IJ SOLN
INTRAMUSCULAR | Status: AC
  Filled 2014-06-18: qty 2

## 2014-06-18 MED ORDER — PHENYLEPHRINE 40 MCG/ML (10ML) SYRINGE FOR IV PUSH (FOR BLOOD PRESSURE SUPPORT)
PREFILLED_SYRINGE | INTRAVENOUS | Status: AC
  Filled 2014-06-18: qty 10

## 2014-06-18 MED ORDER — FENTANYL 25 MCG/HR TD PT72
50.0000 ug | MEDICATED_PATCH | TRANSDERMAL | Status: DC
  Administered 2014-06-19: 50 ug via TRANSDERMAL
  Filled 2014-06-18: qty 2

## 2014-06-18 MED ORDER — ONDANSETRON HCL 4 MG/2ML IJ SOLN: 4.0000 mg | Freq: Once | INTRAMUSCULAR | Status: DC | PRN

## 2014-06-18 MED ORDER — TRAZODONE HCL 100 MG PO TABS
150.0000 mg | ORAL_TABLET | Freq: Every evening | ORAL | Status: DC | PRN
  Administered 2014-06-18: 150 mg via ORAL
  Filled 2014-06-18: qty 2

## 2014-06-18 MED ORDER — ACETAMINOPHEN 650 MG RE SUPP: 650.0000 mg | RECTAL | Status: DC | PRN

## 2014-06-18 MED ORDER — TAMSULOSIN HCL 0.4 MG PO CAPS
0.4000 mg | ORAL_CAPSULE | Freq: Every day | ORAL | Status: DC
  Administered 2014-06-19: 0.4 mg via ORAL
  Filled 2014-06-18: qty 1

## 2014-06-18 MED ORDER — MIDAZOLAM HCL 5 MG/5ML IJ SOLN
INTRAMUSCULAR | Status: DC | PRN
  Administered 2014-06-18: 2 mg via INTRAVENOUS

## 2014-06-18 MED ORDER — SUCCINYLCHOLINE CHLORIDE 20 MG/ML IJ SOLN
INTRAMUSCULAR | Status: AC
  Filled 2014-06-18: qty 1

## 2014-06-18 MED ORDER — METHYLPREDNISOLONE SODIUM SUCC 40 MG IJ SOLR
20.0000 mg | Freq: Every day | INTRAMUSCULAR | Status: DC
  Administered 2014-06-18: 20 mg via INTRAVENOUS
  Filled 2014-06-18: qty 1

## 2014-06-18 MED ORDER — HYDROMORPHONE HCL 1 MG/ML IJ SOLN
INTRAMUSCULAR | Status: AC
  Filled 2014-06-18: qty 1

## 2014-06-18 MED ORDER — METHYLPREDNISOLONE SODIUM SUCC 40 MG IJ SOLR
40.0000 mg | Freq: Every day | INTRAMUSCULAR | Status: DC
  Administered 2014-06-18 – 2014-06-19 (×2): 40 mg via INTRAVENOUS
  Filled 2014-06-18 (×2): qty 1

## 2014-06-18 MED ORDER — FENTANYL CITRATE (PF) 250 MCG/5ML IJ SOLN
INTRAMUSCULAR | Status: AC
  Filled 2014-06-18: qty 5

## 2014-06-18 MED ORDER — MENTHOL 3 MG MT LOZG: 1.0000 | LOZENGE | OROMUCOSAL | Status: DC | PRN

## 2014-06-18 MED ORDER — SODIUM CHLORIDE 0.9 % IV SOLN: 250.0000 mL | INTRAVENOUS | Status: DC

## 2014-06-18 MED ORDER — 0.9 % SODIUM CHLORIDE (POUR BTL) OPTIME
TOPICAL | Status: DC | PRN
  Administered 2014-06-18: 1000 mL

## 2014-06-18 MED ORDER — LACTATED RINGERS IV SOLN
INTRAVENOUS | Status: DC | PRN
  Administered 2014-06-18 (×2): via INTRAVENOUS

## 2014-06-18 MED ORDER — OXYCODONE-ACETAMINOPHEN 5-325 MG PO TABS
1.0000 | ORAL_TABLET | ORAL | Status: DC | PRN
  Administered 2014-06-18 – 2014-06-19 (×5): 2 via ORAL
  Filled 2014-06-18 (×4): qty 2

## 2014-06-18 MED ORDER — LACTATED RINGERS IV SOLN
INTRAVENOUS | Status: DC | PRN
  Administered 2014-06-18 (×2): via INTRAVENOUS

## 2014-06-18 MED ORDER — POTASSIUM CHLORIDE IN NACL 20-0.9 MEQ/L-% IV SOLN: INTRAVENOUS | Status: DC

## 2014-06-18 MED ORDER — DEXTROSE 5 % IV SOLN: 500.0000 mg | Freq: Four times a day (QID) | INTRAVENOUS | Status: DC | PRN

## 2014-06-18 MED ORDER — FERROUS SULFATE 325 (65 FE) MG PO TABS
325.0000 mg | ORAL_TABLET | Freq: Every day | ORAL | Status: DC
  Administered 2014-06-19: 325 mg via ORAL
  Filled 2014-06-18: qty 1

## 2014-06-18 MED ORDER — MORPHINE SULFATE 2 MG/ML IJ SOLN: 1.0000 mg | INTRAMUSCULAR | Status: DC | PRN

## 2014-06-18 MED ORDER — HYDROMORPHONE HCL 1 MG/ML IJ SOLN
INTRAMUSCULAR | Status: AC
  Administered 2014-06-18: 0.5 mg via INTRAVENOUS
  Filled 2014-06-18: qty 1

## 2014-06-18 MED ORDER — ONDANSETRON HCL 4 MG/2ML IJ SOLN: 4.0000 mg | INTRAMUSCULAR | Status: DC | PRN

## 2014-06-18 MED ORDER — SODIUM CHLORIDE 0.9 % IJ SOLN
INTRAMUSCULAR | Status: AC
  Filled 2014-06-18: qty 10

## 2014-06-18 MED ORDER — HYDROMORPHONE HCL 1 MG/ML IJ SOLN: 0.5000 mg | INTRAMUSCULAR | Status: DC | PRN

## 2014-06-18 MED ORDER — BUPIVACAINE HCL (PF) 0.25 % IJ SOLN
INTRAMUSCULAR | Status: DC | PRN
  Administered 2014-06-18: 2 mL

## 2014-06-18 MED ORDER — TRIMETHOPRIM 100 MG PO TABS
100.0000 mg | ORAL_TABLET | Freq: Every day | ORAL | Status: DC
  Administered 2014-06-18: 100 mg via ORAL
  Filled 2014-06-18 (×2): qty 1

## 2014-06-18 MED ORDER — SUCCINYLCHOLINE CHLORIDE 20 MG/ML IJ SOLN
INTRAMUSCULAR | Status: DC | PRN
  Administered 2014-06-18: 50 mg via INTRAVENOUS

## 2014-06-18 MED ORDER — PHENYLEPHRINE HCL 10 MG/ML IJ SOLN
10.0000 mg | INTRAMUSCULAR | Status: DC | PRN
  Administered 2014-06-18: 50 ug/min via INTRAVENOUS

## 2014-06-18 MED ORDER — SODIUM CHLORIDE 0.9 % IJ SOLN: 3.0000 mL | INTRAMUSCULAR | Status: DC | PRN

## 2014-06-18 MED ORDER — ALPRAZOLAM 0.5 MG PO TABS
1.0000 mg | ORAL_TABLET | Freq: Every day | ORAL | Status: DC
  Administered 2014-06-18: 1 mg via ORAL
  Filled 2014-06-18: qty 2

## 2014-06-18 MED ORDER — PROPOFOL 10 MG/ML IV BOLUS
INTRAVENOUS | Status: DC | PRN
  Administered 2014-06-18: 110 mg via INTRAVENOUS

## 2014-06-18 MED ORDER — SODIUM CHLORIDE 0.9 % IJ SOLN
3.0000 mL | Freq: Two times a day (BID) | INTRAMUSCULAR | Status: DC
  Administered 2014-06-18 – 2014-06-19 (×2): 3 mL via INTRAVENOUS

## 2014-06-18 MED ORDER — ROCURONIUM BROMIDE 50 MG/5ML IV SOLN
INTRAVENOUS | Status: AC
  Filled 2014-06-18: qty 1

## 2014-06-18 MED ORDER — ACETAMINOPHEN 325 MG PO TABS: 650.0000 mg | ORAL_TABLET | ORAL | Status: DC | PRN

## 2014-06-18 MED ORDER — HYDROCORTISONE NA SUCCINATE PF 100 MG IJ SOLR: 100.0000 mg | Freq: Every day | INTRAMUSCULAR | Status: DC

## 2014-06-18 MED ORDER — POTASSIUM CHLORIDE CRYS ER 20 MEQ PO TBCR
20.0000 meq | EXTENDED_RELEASE_TABLET | Freq: Two times a day (BID) | ORAL | Status: DC
  Administered 2014-06-18 – 2014-06-19 (×2): 20 meq via ORAL
  Filled 2014-06-18 (×2): qty 1

## 2014-06-18 MED ORDER — DULOXETINE HCL 30 MG PO CPEP
30.0000 mg | ORAL_CAPSULE | Freq: Every day | ORAL | Status: DC
  Administered 2014-06-18 – 2014-06-19 (×2): 30 mg via ORAL
  Filled 2014-06-18 (×2): qty 1

## 2014-06-18 MED ORDER — DEXAMETHASONE 4 MG PO TABS: 4.0000 mg | ORAL_TABLET | Freq: Four times a day (QID) | ORAL | Status: DC

## 2014-06-18 MED ORDER — PHENOL 1.4 % MT LIQD
1.0000 | OROMUCOSAL | Status: DC | PRN
Start: 2014-06-18 — End: 2014-06-19

## 2014-06-18 MED ORDER — EPHEDRINE SULFATE 50 MG/ML IJ SOLN
INTRAMUSCULAR | Status: AC
  Filled 2014-06-18: qty 1

## 2014-06-18 MED ORDER — FENTANYL CITRATE (PF) 100 MCG/2ML IJ SOLN
INTRAMUSCULAR | Status: DC | PRN
  Administered 2014-06-18 (×3): 100 ug via INTRAVENOUS
  Administered 2014-06-18: 50 ug via INTRAVENOUS
  Administered 2014-06-18: 150 ug via INTRAVENOUS

## 2014-06-18 MED ORDER — CEFAZOLIN SODIUM 1-5 GM-% IV SOLN
1.0000 g | Freq: Three times a day (TID) | INTRAVENOUS | Status: AC
  Administered 2014-06-18 – 2014-06-19 (×2): 1 g via INTRAVENOUS
  Filled 2014-06-18 (×2): qty 50

## 2014-06-18 MED ORDER — DEXAMETHASONE SODIUM PHOSPHATE 4 MG/ML IJ SOLN
4.0000 mg | Freq: Four times a day (QID) | INTRAMUSCULAR | Status: DC
  Administered 2014-06-18: 4 mg via INTRAVENOUS
  Filled 2014-06-18: qty 1

## 2014-06-18 MED ORDER — THROMBIN 5000 UNITS EX SOLR
OROMUCOSAL | Status: DC | PRN
  Administered 2014-06-18: 11:00:00 via TOPICAL

## 2014-06-18 MED ORDER — DEXAMETHASONE SODIUM PHOSPHATE 4 MG/ML IJ SOLN
4.0000 mg | Freq: Four times a day (QID) | INTRAMUSCULAR | Status: DC
  Filled 2014-06-18: qty 1

## 2014-06-18 MED ORDER — HYDROMORPHONE HCL 1 MG/ML IJ SOLN
0.2500 mg | INTRAMUSCULAR | Status: DC | PRN
  Administered 2014-06-18 (×4): 0.5 mg via INTRAVENOUS

## 2014-06-18 MED ORDER — SURGIFOAM 100 EX MISC
CUTANEOUS | Status: DC | PRN
  Administered 2014-06-18: 12:00:00 via TOPICAL

## 2014-06-18 MED ORDER — PHENYLEPHRINE HCL 10 MG/ML IJ SOLN
INTRAMUSCULAR | Status: DC | PRN
  Administered 2014-06-18 (×5): 80 ug via INTRAVENOUS

## 2014-06-18 MED ORDER — FLUDROCORTISONE ACETATE 0.1 MG PO TABS
0.1000 mg | ORAL_TABLET | Freq: Two times a day (BID) | ORAL | Status: DC
  Administered 2014-06-18 – 2014-06-19 (×2): 0.1 mg via ORAL
  Filled 2014-06-18 (×3): qty 1

## 2014-06-18 MED ORDER — MIDAZOLAM HCL 2 MG/2ML IJ SOLN
INTRAMUSCULAR | Status: AC
  Filled 2014-06-18: qty 2

## 2014-06-18 MED ORDER — PROPOFOL 10 MG/ML IV BOLUS
INTRAVENOUS | Status: AC
  Filled 2014-06-18: qty 20

## 2014-06-18 MED ORDER — METHOCARBAMOL 500 MG PO TABS: 500.0000 mg | ORAL_TABLET | Freq: Four times a day (QID) | ORAL | Status: DC | PRN

## 2014-06-18 MED ORDER — DEXAMETHASONE 4 MG PO TABS
4.0000 mg | ORAL_TABLET | Freq: Four times a day (QID) | ORAL | Status: DC
  Administered 2014-06-18 – 2014-06-19 (×2): 4 mg via ORAL
  Filled 2014-06-18 (×2): qty 1

## 2014-06-18 MED ORDER — ONDANSETRON HCL 4 MG/2ML IJ SOLN
INTRAMUSCULAR | Status: DC | PRN
  Administered 2014-06-18: 4 mg via INTRAVENOUS

## 2014-06-18 MED ORDER — LIDOCAINE HCL (CARDIAC) 20 MG/ML IV SOLN
INTRAVENOUS | Status: DC | PRN
  Administered 2014-06-18: 70 mg via INTRAVENOUS

## 2014-06-18 MED ORDER — LIDOCAINE HCL (CARDIAC) 20 MG/ML IV SOLN
INTRAVENOUS | Status: AC
  Filled 2014-06-18: qty 5

## 2014-06-18 MED ORDER — SODIUM CHLORIDE 0.9 % IR SOLN
Status: DC | PRN
  Administered 2014-06-18: 10:00:00

## 2014-06-18 SURGICAL SUPPLY — 75 items
APL SKNCLS STERI-STRIP NONHPOA (GAUZE/BANDAGES/DRESSINGS) ×1
BAG DECANTER FOR FLEXI CONT (MISCELLANEOUS) ×3 IMPLANT
BENZOIN TINCTURE PRP APPL 2/3 (GAUZE/BANDAGES/DRESSINGS) ×3 IMPLANT
BLADE CLIPPER SURG (BLADE) IMPLANT
BONE MATRIX OSTEOCEL PRO SM (Bone Implant) ×4 IMPLANT
BUR MATCHSTICK NEURO 3.0 LAGG (BURR) ×3 IMPLANT
CAGE COROENT MP 8X9X23M-8 SPIN (Cage) ×4 IMPLANT
CANISTER SUCT 3000ML PPV (MISCELLANEOUS) ×3 IMPLANT
CLIP NEUROVISION LG (CLIP) ×2 IMPLANT
CLOSURE WOUND 1/2 X4 (GAUZE/BANDAGES/DRESSINGS) ×1
CONT SPEC 4OZ CLIKSEAL STRL BL (MISCELLANEOUS) ×8 IMPLANT
COVER BACK TABLE 24X17X13 BIG (DRAPES) ×2 IMPLANT
COVER BACK TABLE 60X90IN (DRAPES) ×3 IMPLANT
DRAPE C-ARM 42X72 X-RAY (DRAPES) ×3 IMPLANT
DRAPE C-ARMOR (DRAPES) ×3 IMPLANT
DRAPE LAPAROTOMY 100X72X124 (DRAPES) ×3 IMPLANT
DRAPE POUCH INSTRU U-SHP 10X18 (DRAPES) ×3 IMPLANT
DRAPE SURG 17X23 STRL (DRAPES) ×3 IMPLANT
DRSG OPSITE 4X5.5 SM (GAUZE/BANDAGES/DRESSINGS) ×6 IMPLANT
DRSG OPSITE POSTOP 4X6 (GAUZE/BANDAGES/DRESSINGS) ×2 IMPLANT
DRSG TELFA 3X8 NADH (GAUZE/BANDAGES/DRESSINGS) IMPLANT
DURAPREP 26ML APPLICATOR (WOUND CARE) ×3 IMPLANT
ELECT REM PT RETURN 9FT ADLT (ELECTROSURGICAL) ×3
ELECTRODE REM PT RTRN 9FT ADLT (ELECTROSURGICAL) ×1 IMPLANT
EVACUATOR 1/8 PVC DRAIN (DRAIN) ×3 IMPLANT
GAUZE SPONGE 4X4 16PLY XRAY LF (GAUZE/BANDAGES/DRESSINGS) IMPLANT
GLOVE BIO SURGEON STRL SZ8 (GLOVE) ×8 IMPLANT
GLOVE BIOGEL PI IND STRL 7.0 (GLOVE) IMPLANT
GLOVE BIOGEL PI IND STRL 7.5 (GLOVE) IMPLANT
GLOVE BIOGEL PI IND STRL 8 (GLOVE) IMPLANT
GLOVE BIOGEL PI INDICATOR 7.0 (GLOVE) ×2
GLOVE BIOGEL PI INDICATOR 7.5 (GLOVE) ×6
GLOVE BIOGEL PI INDICATOR 8 (GLOVE) ×2
GLOVE ECLIPSE 7.5 STRL STRAW (GLOVE) ×2 IMPLANT
GLOVE INDICATOR 8.5 STRL (GLOVE) ×2 IMPLANT
GLOVE SURG SS PI 7.0 STRL IVOR (GLOVE) ×4 IMPLANT
GOWN STRL REUS W/ TWL LRG LVL3 (GOWN DISPOSABLE) IMPLANT
GOWN STRL REUS W/ TWL XL LVL3 (GOWN DISPOSABLE) ×2 IMPLANT
GOWN STRL REUS W/TWL 2XL LVL3 (GOWN DISPOSABLE) IMPLANT
GOWN STRL REUS W/TWL LRG LVL3 (GOWN DISPOSABLE)
GOWN STRL REUS W/TWL XL LVL3 (GOWN DISPOSABLE) ×12
HEMOSTAT POWDER KIT SURGIFOAM (HEMOSTASIS) ×2 IMPLANT
KIT BASIN OR (CUSTOM PROCEDURE TRAY) ×3 IMPLANT
KIT NDL NVM5 EMG ELECT (KITS) IMPLANT
KIT NEEDLE NVM5 EMG ELECT (KITS) ×1 IMPLANT
KIT NEEDLE NVM5 EMG ELECTRODE (KITS) ×2
KIT ROOM TURNOVER OR (KITS) ×3 IMPLANT
MILL MEDIUM DISP (BLADE) ×2 IMPLANT
NDL HYPO 25X1 1.5 SAFETY (NEEDLE) ×1 IMPLANT
NEEDLE HYPO 25X1 1.5 SAFETY (NEEDLE) ×3 IMPLANT
NS IRRIG 1000ML POUR BTL (IV SOLUTION) ×3 IMPLANT
PACK LAMINECTOMY NEURO (CUSTOM PROCEDURE TRAY) ×3 IMPLANT
PAD ARMBOARD 7.5X6 YLW CONV (MISCELLANEOUS) ×9 IMPLANT
PAD DRESSING TELFA 3X8 NADH (GAUZE/BANDAGES/DRESSINGS) ×1 IMPLANT
PATTIES SURGICAL .5 X.5 (GAUZE/BANDAGES/DRESSINGS) ×2 IMPLANT
ROD 30MM (Rod) ×4 IMPLANT
ROD PLIF MAS PB SPHERX 30 (Rod) IMPLANT
SCREW LOCK (Screw) ×12 IMPLANT
SCREW LOCK FXNS SPNE MAS PL (Screw) IMPLANT
SCREW SHANK 5.0X30MM (Screw) ×4 IMPLANT
SCREW SHANK 6.5X30 (Screw) ×4 IMPLANT
SCREW TULIP 5.5 (Screw) ×8 IMPLANT
SPONGE LAP 4X18 X RAY DECT (DISPOSABLE) IMPLANT
SPONGE SURGIFOAM ABS GEL 100 (HEMOSTASIS) ×3 IMPLANT
STRIP CLOSURE SKIN 1/2X4 (GAUZE/BANDAGES/DRESSINGS) ×3 IMPLANT
SUT VIC AB 0 CT1 18XCR BRD8 (SUTURE) ×1 IMPLANT
SUT VIC AB 0 CT1 8-18 (SUTURE) ×6
SUT VIC AB 2-0 CP2 18 (SUTURE) ×5 IMPLANT
SUT VIC AB 3-0 SH 8-18 (SUTURE) ×6 IMPLANT
SYR 20ML ECCENTRIC (SYRINGE) ×3 IMPLANT
SYR 3ML LL SCALE MARK (SYRINGE) IMPLANT
TOWEL OR 17X24 6PK STRL BLUE (TOWEL DISPOSABLE) ×3 IMPLANT
TOWEL OR 17X26 10 PK STRL BLUE (TOWEL DISPOSABLE) ×3 IMPLANT
TRAY FOLEY CATH 14FRSI W/METER (CATHETERS) ×3 IMPLANT
WATER STERILE IRR 1000ML POUR (IV SOLUTION) ×3 IMPLANT

## 2014-06-18 NOTE — Evaluation (Signed)
Occupational Therapy Evaluation Patient Details Name: Katrina Rivera MRN: [id number] DOB: Aug 24, 1945 Today's Date: 06/18/2014    History of Present Illness Pt is a 69 y.o. Female with PMH of MS, OA, hypotension,fibromyalgia, now s/p Decompressive lumbar laminectomy L5-S1, Posterior lumbar interbody fusion L5-S1, and Posterior fixation L5-S1 on 06/18/14 for lower back and leg pain.   Clinical Impression   PTA pt lived at home and was independent with ADLS. Pt currently requires Supervision for functional mobility and ADLs. Educated pt on back precautions and incorporating into ADLs. Pt will benefit from additional OT session to promote independence and safety with tub transfers and ADLs.     Follow Up Recommendations  No OT follow up;Supervision - Intermittent    Equipment Recommendations  None recommended by OT    Recommendations for Other Services       Precautions / Restrictions Precautions Precautions: Back Precaution Booklet Issued: No Precaution Comments: Educated pt on 3/3 back precautions and incorporating into ADLs.  Required Braces or Orthoses: Spinal Brace Spinal Brace: Lumbar corset Restrictions Weight Bearing Restrictions: No      Mobility Bed Mobility Overal bed mobility: Needs Assistance Bed Mobility: Rolling;Sidelying to Sit Rolling: Supervision Sidelying to sit: Supervision       General bed mobility comments: VC's for sequencing. Supervision for safety. Good technique.   Transfers Overall transfer level: Needs assistance Equipment used: Rolling walker (2 wheeled) Transfers: Sit to/from Stand Sit to Stand: Supervision         General transfer comment: Supervision for safety. VC's for hand placement.     Balance Overall balance assessment: No apparent balance deficits (not formally assessed)                                          ADL Overall ADL's : Needs assistance/impaired Eating/Feeding: Independent;Sitting    Grooming: Supervision/safety;Standing   Upper Body Bathing: Set up;Sitting   Lower Body Bathing: Supervison/ safety;Set up;Sit to/from stand   Upper Body Dressing : Set up;Sitting   Lower Body Dressing: Supervision/safety;Set up;Sit to/from stand   Toilet Transfer: Supervision/safety;Ambulation;RW           Functional mobility during ADLs: Supervision/safety;Rolling walker General ADL Comments: Pt at Supervision level for functional mobility and ADLs. Educated pt on back precautions and incorporating into ADLs. Pt has tub at home and will benefit from tub transfer training.     Vision Additional Comments: No change from baseline          Pertinent Vitals/Pain Pain Assessment: No/denies pain     Hand Dominance     Extremity/Trunk Assessment Upper Extremity Assessment Upper Extremity Assessment: Overall WFL for tasks assessed   Lower Extremity Assessment Lower Extremity Assessment: Defer to PT evaluation   Cervical / Trunk Assessment Cervical / Trunk Assessment: Normal   Communication Communication Communication: No difficulties   Cognition Arousal/Alertness: Awake/alert Behavior During Therapy: WFL for tasks assessed/performed Overall Cognitive Status: Within Functional Limits for tasks assessed                                Home Living Family/patient expects to be discharged to:: Private residence Living Arrangements: Spouse/significant other Available Help at Discharge: Family;Available 24 hours/day Type of Home: House Home Access: Stairs to enter [employer] of Steps: 1 and 1   Home Layout: One level  Bathroom Shower/Tub: Therapist, sports characteristics: Engineer, building services: Standard     Home Equipment: Emergency planning/management officer - 4 wheels          Prior Functioning/Environment Level of Independence: Independent        Comments: walking without AD    OT Diagnosis: Generalized weakness;Acute pain    OT Problem List: Decreased strength;Decreased range of motion;Decreased activity tolerance;Impaired balance (sitting and/or standing);Decreased knowledge of use of DME or AE;Decreased knowledge of precautions;Pain   OT Treatment/Interventions: Self-care/ADL training;Therapeutic exercise;Energy conservation;DME and/or AE instruction;Therapeutic activities;Patient/family education;Balance training    OT Goals(Current goals can be found in the care plan section) Acute Rehab OT Goals Patient Stated Goal: to go home soon OT Goal Formulation: With patient Time For Goal Achievement: 07/02/14 Potential to Achieve Goals: Good ADL Goals Pt Will Perform Grooming: with modified independence;standing Pt Will Transfer to Toilet: with modified independence;ambulating Pt Will Perform Tub/Shower Transfer: Tub transfer;with modified independence;ambulating;shower seat;rolling walker  OT Frequency: Min 1X/week    End of Session Equipment Utilized During Treatment: Gait belt;Rolling walker;Back brace Nurse Communication: Mobility status;Other (comment) (pt in recliner)  Activity Tolerance: Patient tolerated treatment well Patient left: in chair;with call bell/phone within reach;with family/visitor present   Time: 1722-1747 OT Time Calculation (min): 25 min Charges:  OT General Charges $OT Visit: 1 Procedure OT Evaluation $Initial OT Evaluation Tier I: 1 Procedure OT Treatments $Self Care/Home Management : 8-22 mins G-Codes:    Nena Jordan M 07/13/14, 6:06 PM  Carney Living, OTR/L Occupational Therapist [phone] (pager)

## 2014-06-18 NOTE — Progress Notes (Signed)
Pt ambulated apx 75 feet with OT and walker, tolerated very well, sat up in the chair for apx 30 minutes and was returned back to bed thereafter.

## 2014-06-18 NOTE — Op Note (Signed)
06/18/2014  12:55 PM  PATIENT:  Katrina Rivera  69 y.o. 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 Redo Decompressive lumbar laminectomy L5-S1 requiring more work than would be required for a simple exposure of the disk for PLIF in order to adequately decompress the neural elements and address the spinal stenosis 2. Posterior lumbar interbody fusion L5-S1 using PEEK interbody cages packed  with morcellized allograft and autograft 3. Posterior fixation L5-S1 using cortical pedicle screws.    SURGEON:  Marikay Alar, MD  ASSISTANTS: Dr. Glee Arvin  ANESTHESIA:  General  EBL: 100 ml  Total I/O In: 2000 [I.V.:2000] Out: 1250 [Urine:1150; Blood:100]  BLOOD ADMINISTERED:none  DRAINS: None   INDICATION FOR PROCEDURE: This patient underwent a left L5-S1 X foraminal microdiscectomy in the past. She presented with a recurrent left L5 radiculopathy. 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 She tried medical management without relief. She tried injections without relief. I recommended a repeat decompression with facetectomy followed by instrumented fusion. Patient understood the risks, benefits, and alternatives and potential outcomes and wished to proceed.  PROCEDURE DETAILS:  The patient was brought to the operating room. After induction of generalized endotracheal anesthesia the patient was rolled into the prone position on chest rolls and all pressure points were padded. The patient's lumbar region was cleaned and then prepped with DuraPrep and draped in the usual sterile fashion. Anesthesia was injected and then a dorsal midline incision was made and carried down to the lumbosacral fascia. The fascia was opened and the paraspinous musculature was taken down in a subperiosteal fashion to expose L5-S1. A self-retaining retractor was placed.  Intraoperative fluoroscopy confirmed my level, and I started with placement of the L5 cortical pedicle screws. The pedicle screw entry zones were identified utilizing surface landmarks and  AP and lateral fluoroscopy. I scored the cortex with the high-speed drill and then used the hand drill and EMG monitoring to drill an upward and outward direction into the pedicle. I then tapped line to line, and the tap was also monitored. I then placed a 5-0 x 30 mm cortical pedicle screw into the pedicles of L5 bilaterally. I then turned my attention to the decompression and the spinous process was removed and complete lumbar laminectomies, hemi- facetectomies, and foraminotomies were performed at L5-S1. The patient had significant spinal stenosis and this required more work than would be required for a simple exposure of the disc for posterior lumbar interbody fusion. Much more generous decompression was undertaken in order to adequately decompress the neural elements and address the patient's leg pain. The yellow ligament was removed to expose the underlying dura and nerve roots, and generous foraminotomies were performed to adequately decompress the neural elements. Both the exiting and traversing nerve roots were decompressed on both sides until a coronary dilator passed easily along the nerve roots. Once the decompression was complete, I turned my attention to the posterior lower lumbar interbody fusion. The epidural venous vasculature was coagulated and cut sharply. Disc space was incised and the initial discectomy was performed with pituitary rongeurs. The disc space was distracted with sequential distractors to a height of 8 mm. We then used a series of scrapers and shavers to prepare the endplates for fusion. The midline was prepared with Epstein curettes. Once the complete discectomy was finished, we packed an appropriate sized peek interbody cage with local autograft and  morcellized allograft, gently retracted the  nerve root, and tapped the cage into position at L5-S1.  The midline between the cages was packed with morselized autograft and allograft. We then turned our attention to the placement of the lower pedicle screws. The pedicle screw entry zones were identified utilizing surface landmarks and fluoroscopy. I drilled into each pedicle utilizing the hand drill and EMG monitoring, and tapped each pedicle with the appropriate tap. We palpated with a ball probe to assure no break in the cortex. We then placed 6.5 x 35 mm cortical pedicle screws into the pedicles bilaterally at S1.  We then placed lordotic rods into the multiaxial screw heads of the pedicle screws and locked these in position with the locking caps and anti-torque device. We then checked our construct with AP and lateral fluoroscopy. Irrigated with copious amounts of bacitracin-containing saline solution. Placed a medium Hemovac drain through separate stab incision. Inspected the nerve roots once again to assure adequate decompression, lined to the dura with Gelfoam, and closed the muscle and the fascia with 0 Vicryl. Closed the subcutaneous tissues with 2-0 Vicryl and subcuticular tissues with 3-0 Vicryl. The skin was closed with benzoin and Steri-Strips. Dressing was then applied, the patient was awakened from general anesthesia and transported to the recovery room in stable condition. At the end of the procedure all sponge, needle and instrument counts were correct.   PLAN OF CARE: Admit to inpatient   PATIENT DISPOSITION:  PACU - hemodynamically stable.   Delay start of Pharmacological VTE agent (>24hrs) due to surgical blood loss or risk of bleeding:  yes

## 2014-06-18 NOTE — Transfer of Care (Signed)
Immediate Anesthesia Transfer of Care Note  Patient: Katrina Rivera  Procedure(s) Performed: Procedure(s): MAXIMUM ACCESS SURGERY POSTERIOR LUMBAR INTERBODY FUSION LUMBAR FIVE TO SACRAL ONE  (N/A)  Patient Location: PACU  Anesthesia Type:General  Level of Consciousness: awake, alert , oriented and patient cooperative  Airway & Oxygen Therapy: Patient Spontanous Breathing and Patient connected to nasal cannula oxygen  Post-op Assessment: Report given to RN, Post -op Vital signs reviewed and stable and Patient moving all extremities  Post vital signs: Reviewed and stable  Last Vitals:  Filed Vitals:   06/18/14 1252  BP: 155/64  Pulse: 104  Temp: 36.9 C  Resp: 21    Complications: No apparent anesthesia complications

## 2014-06-18 NOTE — H&P (Signed)
Subjective: Patient is a 69 y.o. female admitted for PLIF. Onset of symptoms was several months ago, gradually worsening since that time.  The pain is rated severe, and is located at the across the lower back and radiates to legs. The pain is described as aching and occurs all day. The symptoms have been progressive. Symptoms are exacerbated by exercise. MRI or CT showed foraminal stenosis L5-s1.   Past Medical History  Diagnosis Date  . Fibromyalgia   . Restless leg syndrome   . Multiple sclerosis     doesn't take any meds  . Arthritis   . Osteoporosis   . Hypotension   . Headache(784.0)   . GERD (gastroesophageal reflux disease)   . Anxiety     takes Xanax nightly  . IBS (irritable bowel syndrome)     takes Librarian, academic daily  . Seasonal allergies     takes Zyrtec daily;uses Flonase daily as needed  . Hypotension     takes Florinef daily  . History of bronchitis     many yrs ago   . Dizziness     if b/p drops   . Peripheral neuropathy   . Weakness     numbness and tingling  . Joint pain   . Chronic back pain     stenosis  . Urinary frequency     takes Flomax daily  . Nocturia   . Anemia     takes Ferrous Sulfate daily  . History of blood transfusion     no abnormal reaction noted  . Addison's disease     takes Solu Cortef daily  . Hypokalemia     takes Potassium daily  . Depression     takes Cymbalta daily  . Insomnia     takes Trazodone nightly    Past Surgical History  Procedure Laterality Date  . Cesarean section  1973/1977    x2  . Eye surgery      bilateral - /w IOL  . Lumbar laminectomy/decompression microdiscectomy Left 01/24/2013    Procedure: LUMBAR FIVE TO SACRAL ONE LUMBAR LAMINECTOMY/DECOMPRESSION MICRODISCECTOMY 1 LEVEL;  Surgeon: Tia Alert, MD;  Location: [facility];  Service: Neurosurgery;  Laterality: Left;  . Fracture surgery Right     rods and screws  . Cholecystectomy  1997  . Abdominal hysterectomy  1988  . Appendectomy  1988  .  Colectomy  1990  . Colonoscopy    . Esophagogastroduodenoscopy      Prior to Admission medications   Medication Sig Start Date End Date Taking? Authorizing Provider  ALPRAZolam Prudy Feeler) 1 MG tablet Take 1 mg by mouth at bedtime.   Yes Historical Provider, MD  bifidobacterium infantis (ALIGN) capsule Take 1 capsule by mouth daily.   Yes Historical Provider, MD  calcium-vitamin D (OSCAL WITH D) 500-200 MG-UNIT per tablet Take 1 tablet by mouth 2 (two) times daily.   Yes Historical Provider, MD  cetirizine (ZYRTEC) 10 MG tablet Take 10 mg by mouth daily.   Yes Historical Provider, MD  docusate sodium (COLACE) 100 MG capsule Take 100 mg by mouth 2 (two) times daily.   Yes Historical Provider, MD  DULoxetine (CYMBALTA) 30 MG capsule Take 1 capsule (30 mg total) by mouth daily. 02/27/14  Yes Levert Feinstein, MD  fentaNYL (DURAGESIC - DOSED MCG/HR) 50 MCG/HR Place 1 patch (50 mcg total) onto the skin every 3 (three) days. 05/28/14  Yes Levert Feinstein, MD  Ferrous Sulfate Dried 200 (65 FE) MG TABS Take 65 mg by  mouth daily.   Yes Historical Provider, MD  fludrocortisone (FLORINEF) 0.1 MG tablet One tablet in the morning and two tablets at night. Patient taking differently: Take 0.1 mg by mouth 2 (two) times daily.  02/13/14  Yes Reather Littler, MD  hydrocortisone sodium succinate (SOLU-CORTEF) 100 MG SOLR injection Inject  0.4 mls every morning and 0.78mls every evening. 04/28/14  Yes Reather Littler, MD  ibuprofen (ADVIL,MOTRIN) 200 MG tablet Take 400 mg by mouth 2 (two) times daily as needed (pain).   Yes Historical Provider, MD  Insulin Syringe-Needle U-100 (B-D INSULIN SYRINGE) 31G X 5/16" 0.3 ML MISC Use 2 per day 04/28/14  Yes Reather Littler, MD  Multiple Vitamin (MULITIVITAMIN WITH MINERALS) TABS Take 1 tablet by mouth daily.   Yes Historical Provider, MD  Omega-3 Fatty Acids (FISH OIL) 1200 MG CAPS Take 2 capsules by mouth daily.   Yes Historical Provider, MD  omeprazole (PRILOSEC) 40 MG capsule Take 40 mg by mouth daily.   11/01/13  Yes Historical Provider, MD  ondansetron (ZOFRAN) 4 MG tablet Take 1 tablet every 4 hours as needed for nausea or vomiting. 04/30/14  Yes Reather Littler, MD  Polyethyl Glycol-Propyl Glycol (SYSTANE OP) Apply 2 drops to eye daily as needed (for dry eyes).   Yes Historical Provider, MD  potassium chloride SA (K-DUR,KLOR-CON) 20 MEQ tablet Take 20 mEq by mouth 2 (two) times daily.   Yes Historical Provider, MD  tamsulosin (FLOMAX) 0.4 MG CAPS capsule Take 0.4 mg by mouth daily after breakfast.   Yes Historical Provider, MD  traZODone (DESYREL) 150 MG tablet Take 150 mg by mouth at bedtime.   Yes Historical Provider, MD  trimethoprim (TRIMPEX) 100 MG tablet Take 100 mg by mouth at bedtime.  07/07/13  Yes Historical Provider, MD  CORTEF 10 MG tablet Take 1 tablet (10 mg total) by mouth daily. 03/04/14   Reather Littler, MD  fluticasone (FLONASE) 50 MCG/ACT nasal spray Place 2 sprays into both nostrils at bedtime as needed for allergies or rhinitis.    Historical Provider, MD  potassium chloride (K-DUR,KLOR-CON) 10 MEQ tablet Take 3 in the morning and two at bedtime. 12/24/13   Reather Littler, MD  potassium chloride (KLOR-CON 10) 10 MEQ tablet Take 1 tablet (10 mEq total) by mouth 2 (two) times daily. Taking 3 in the am and 2 in the pm. Patient taking differently: Take 10 mEq by mouth 2 (two) times daily. Taking 2 in the am and 2 in the pm. 02/13/14   Reather Littler, MD  traZODone (DESYREL) 100 MG tablet Take 1 tablet at bedtime 03/05/14   Reather Littler, MD   No Known Allergies  History  Substance Use Topics  . Smoking status: Never Smoker   . Smokeless tobacco: Never Used  . Alcohol Use: No    Family History  Problem Relation Age of Onset  . Hypertension Mother   . Stroke Mother   . Heart attack Father      Review of Systems  Positive ROS: neg  All other systems have been reviewed and were otherwise negative with the exception of those mentioned in the HPI and as above.  Objective: Vital signs in last  24 hours: Temp:  [97.4 F (36.3 C)] 97.4 F (36.3 C) (05/11 0747) Pulse Rate:  [78] 78 (05/11 0747) BP: (237)/(85) 237/85 mmHg (05/11 0747) SpO2:  [100 %] 100 % (05/11 0747) Weight:  [123 lb (55.792 kg)] 123 lb (55.792 kg) (05/11 0747)  General Appearance: Alert, cooperative, no distress,  appears stated age Head: Normocephalic, without obvious abnormality, atraumatic Eyes: PERRL, conjunctiva/corneas clear, EOM's intact    Neck: Supple, symmetrical, trachea midline Back: Symmetric, no curvature, ROM normal, no CVA tenderness Lungs:  respirations unlabored Heart: Regular rate and rhythm Abdomen: Soft, non-tender Extremities: Extremities normal, atraumatic, no cyanosis or edema Pulses: 2+ and symmetric all extremities Skin: Skin color, texture, turgor normal, no rashes or lesions  NEUROLOGIC:   Mental status: Alert and oriented x4,  no aphasia, good attention span, fund of knowledge, and memory Motor Exam - grossly normal Sensory Exam - grossly normal Reflexes: 1+ Coordination - grossly normal Gait - grossly normal Balance - grossly normal Cranial Nerves: I: smell Not tested  II: visual acuity  OS: nl    OD: nl  II: visual fields Full to confrontation  II: pupils Equal, round, reactive to light  III,VII: ptosis None  III,IV,VI: extraocular muscles  Full ROM  V: mastication Normal  V: facial light touch sensation  Normal  V,VII: corneal reflex  Present  VII: facial muscle function - upper  Normal  VII: facial muscle function - lower Normal  VIII: hearing Not tested  IX: soft palate elevation  Normal  IX,X: gag reflex Present  XI: trapezius strength  5/5  XI: sternocleidomastoid strength 5/5  XI: neck flexion strength  5/5  XII: tongue strength  Normal    Data Review Lab Results  Component Value Date   WBC 8.2 06/09/2014   HGB 12.7 06/09/2014   HCT 39.7 06/09/2014   MCV 94.1 06/09/2014   PLT 224 06/09/2014   Lab Results  Component Value Date   NA 135  06/09/2014   K 4.2 06/09/2014   CL 102 06/09/2014   CO2 22 06/09/2014   BUN 15 06/09/2014   CREATININE 0.89 06/09/2014   GLUCOSE 93 06/09/2014   Lab Results  Component Value Date   INR 0.94 06/09/2014    Assessment/Plan: Patient admitted for PLIF L5-S1. Patient has failed a reasonable attempt at conservative therapy.  I explained the condition and procedure to the patient and answered any questions.  Patient wishes to proceed with procedure as planned. Understands risks/ benefits and typical outcomes of procedure.   Katrina Rivera S 06/18/2014 9:59 AM

## 2014-06-18 NOTE — OR Nursing (Signed)
Nuvasive needle electrodes placed after induction to bilateral upper and lower extremities and upper torso for nerve monitoring then removed at end of procedure

## 2014-06-18 NOTE — Anesthesia Preprocedure Evaluation (Signed)
Anesthesia Evaluation  Patient identified by MRN, date of birth, ID band Patient awake    Reviewed: Allergy & Precautions, NPO status , Patient's Chart, lab work & pertinent test results  Airway Mallampati: I       Dental   Pulmonary    Pulmonary exam normal       Cardiovascular Normal cardiovascular exam    Neuro/Psych  Headaches,  Neuromuscular disease    GI/Hepatic GERD-  ,  Endo/Other    Renal/GU      Musculoskeletal  (+) Arthritis -, Fibromyalgia -  Abdominal   Peds  Hematology  (+) anemia ,   Anesthesia Other Findings MS addison's  Reproductive/Obstetrics                             Anesthesia Physical Anesthesia Plan  ASA: III  Anesthesia Plan: General   Post-op Pain Management:    Induction: Intravenous  Airway Management Planned: Oral ETT  Additional Equipment:   Intra-op Plan:   Post-operative Plan: Extubation in OR  Informed Consent: I have reviewed the patients History and Physical, chart, labs and discussed the procedure including the risks, benefits and alternatives for the proposed anesthesia with the patient or authorized representative who has indicated his/her understanding and acceptance.     Plan Discussed with: CRNA, Anesthesiologist and Surgeon  Anesthesia Plan Comments:         Anesthesia Quick Evaluation

## 2014-06-18 NOTE — Anesthesia Procedure Notes (Signed)
Procedure Name: Intubation Date/Time: 06/18/2014 10:11 AM Performed by: Ferol Luz L Pre-anesthesia Checklist: Patient identified, Emergency Drugs available, Suction available, Patient being monitored and Timeout performed Patient Re-evaluated:Patient Re-evaluated prior to inductionOxygen Delivery Method: Circle system utilized Preoxygenation: Pre-oxygenation with 100% oxygen Intubation Type: IV induction and Cricoid Pressure applied Ventilation: Mask ventilation without difficulty Laryngoscope Size: Mac and 3 Grade View: Grade II Tube type: Oral Tube size: 7.0 mm Number of attempts: 1 Airway Equipment and Method: Stylet Placement Confirmation: ETT inserted through vocal cords under direct vision,  positive ETCO2 and breath sounds checked- equal and bilateral Secured at: 20 cm Tube secured with: Tape Dental Injury: Teeth and Oropharynx as per pre-operative assessment

## 2014-06-18 NOTE — Anesthesia Postprocedure Evaluation (Signed)
  Anesthesia Post-op Note  Patient: Katrina Rivera  Procedure(s) Performed: Procedure(s): MAXIMUM ACCESS SURGERY POSTERIOR LUMBAR INTERBODY FUSION LUMBAR FIVE TO SACRAL ONE  (N/A)  Patient Location: PACU  Anesthesia Type:General  Level of Consciousness: awake, alert , oriented and patient cooperative  Airway and Oxygen Therapy: Patient Spontanous Breathing  Post-op Pain: mild  Post-op Assessment: Post-op Vital signs reviewed, Patient's Cardiovascular Status Stable, Respiratory Function Stable, Patent Airway, No signs of Nausea or vomiting and Pain level controlled  Post-op Vital Signs: stable  Last Vitals:  Filed Vitals:   06/18/14 1300  BP:   Pulse: 93  Temp:   Resp: 22    Complications: No apparent anesthesia complications

## 2014-06-18 NOTE — Progress Notes (Signed)
Pt arrived to 4N26 at 1435.  Pt A&O x 4, c/o 7/10 lower back surgical pain, site covered with CDI honeycomb dressing, no drains.   Pt V/S taken, pt on 2L O2, fluids running at 75 cc/hr.  Foley intact, unclamped. Pt without distress. Diet ordered, will monitor.

## 2014-06-19 ENCOUNTER — Encounter (HOSPITAL_COMMUNITY): Payer: Self-pay | Admitting: Neurological Surgery

## 2014-06-19 LAB — CBC
HCT: 30.4 % — ABNORMAL LOW (ref 36.0–46.0)
Hemoglobin: 10.2 g/dL — ABNORMAL LOW (ref 12.0–15.0)
MCH: 30.7 pg (ref 26.0–34.0)
MCHC: 33.6 g/dL (ref 30.0–36.0)
MCV: 91.6 fL (ref 78.0–100.0)
PLATELETS: 153 10*3/uL (ref 150–400)
RBC: 3.32 MIL/uL — ABNORMAL LOW (ref 3.87–5.11)
RDW: 12.4 % (ref 11.5–15.5)
WBC: 11.7 10*3/uL — ABNORMAL HIGH (ref 4.0–10.5)

## 2014-06-19 MED ORDER — OXYCODONE-ACETAMINOPHEN 5-325 MG PO TABS: 1.0000 | ORAL_TABLET | ORAL | Status: DC | PRN

## 2014-06-19 MED ORDER — METHOCARBAMOL 500 MG PO TABS: 500.0000 mg | ORAL_TABLET | Freq: Four times a day (QID) | ORAL | Status: DC | PRN

## 2014-06-19 NOTE — Progress Notes (Signed)
Occupational Therapy Treatment Patient Details Name: Katrina Rivera MRN: [id number] DOB: 1945-04-11 Today's Date: 06/19/2014    History of present illness Pt is a 69 y.o. Female with PMH of MS, OA, hypotension,fibromyalgia, now s/p Decompressive lumbar laminectomy L5-S1, Posterior lumbar interbody fusion L5-S1, and Posterior fixation L5-S1 on 06/18/14 for lower back and leg pain.   OT comments  Pt progressing towards acute OT goals. Pt completed ADLs as detailed below. Reviewed ADL education and practiced simulated tub transfer at min guard level for safety. Spouse present for session and will assist pt at home at d/c. D/c plan remains appropriate.  Follow Up Recommendations  No OT follow up;Supervision - Intermittent    Equipment Recommendations  None recommended by OT    Recommendations for Other Services      Precautions / Restrictions Precautions Precautions: Back Precaution Booklet Issued: Yes (comment) Precaution Comments: Reviewed Required Braces or Orthoses: Spinal Brace Spinal Brace: Lumbar corset Restrictions Weight Bearing Restrictions: No       Mobility Bed Mobility Overal bed mobility: Needs Assistance Bed Mobility: Rolling;Sidelying to Sit Rolling: Supervision Sidelying to sit: Supervision     Sit to sidelying: Supervision General bed mobility comments: cues for technique  Transfers Overall transfer level: Needs assistance Equipment used: Rolling walker (2 wheeled) Transfers: Sit to/from Stand Sit to Stand: Supervision         General transfer comment: supervision for safety. no physcial assist. Cues for technique    Balance Overall balance assessment: Modified Independent Sitting-balance support: No upper extremity supported;Feet supported Sitting balance-Leahy Scale: Normal     Standing balance support: No upper extremity supported Standing balance-Leahy Scale: Fair                     ADL Overall ADL's : Needs  assistance/impaired                         Toilet Transfer: Supervision/safety;Ambulation;RW Toilet Transfer Details (indicate cue type and reason): ambulated to regular height toilet with grab bars. supervision for safety. Toileting- Architect and Hygiene: Min guard;Sit to/from stand Toileting - Clothing Manipulation Details (indicate cue type and reason): educated on AE to assist with toiliet hygenie (tongs) to avoid twisting Tub/ Shower Transfer: Min guard;Ambulation;Shower Dealer Details (indicate cue type and reason): Simulated home setup for shower transfers and pt practiced with spouse present. Discussed safety at home with showering. Functional mobility during ADLs: Supervision/safety;Rolling walker General ADL Comments: Reviewed techniques and AE for ADLs including LB dressing, grooming, perianl care, etc. Pt completed ADLs as detailed below. Pt's spouse present for session.       Vision                     Perception     Praxis      Cognition   Behavior During Therapy: WFL for tasks assessed/performed Overall Cognitive Status: Within Functional Limits for tasks assessed                       Extremity/Trunk Assessment  Upper Extremity Assessment Upper Extremity Assessment: Defer to OT evaluation   Lower Extremity Assessment Lower Extremity Assessment: Generalized weakness   Cervical / Trunk Assessment Cervical / Trunk Assessment: Normal    Exercises     Shoulder Instructions       General Comments      Pertinent Vitals/ Pain       Pain Assessment:  0-10 Pain Score: 5  Pain Location: back Pain Descriptors / Indicators: Sore Pain Intervention(s): Monitored during session;Repositioned;Patient requesting pain meds-RN notified;RN gave pain meds during session  Home Living Family/patient expects to be discharged to:: Private residence Living Arrangements: Spouse/significant  other Available Help at Discharge: Family;Available 24 hours/day Type of Home: House Home Access: Stairs to enter [employer] of Steps: 1 and 1 Entrance Stairs-Rails: None Home Layout: One level               Home Equipment: Emergency planning/management officer - 4 wheels;Cane - single point;Wheelchair - manual          Prior Functioning/Environment Level of Independence: Independent        Comments: walking without AD   Frequency Min 1X/week     Progress Toward Goals  OT Goals(current goals can now be found in the care plan section)  Progress towards OT goals: Progressing toward goals  Acute Rehab OT Goals Patient Stated Goal: to go home soon OT Goal Formulation: With patient Time For Goal Achievement: 07/02/14 Potential to Achieve Goals: Good ADL Goals Pt Will Perform Grooming: with modified independence;standing Pt Will Transfer to Toilet: with modified independence;ambulating Pt Will Perform Tub/Shower Transfer: Tub transfer;with modified independence;ambulating;shower seat;rolling walker  Plan Discharge plan remains appropriate    Co-evaluation                 End of Session Equipment Utilized During Treatment: Gait belt;Rolling walker;Back brace   Activity Tolerance Patient tolerated treatment well   Patient Left in chair;with call bell/phone within reach;with family/visitor present   Nurse Communication          Time: 6010-9323 OT Time Calculation (min): 29 min  Charges: OT General Charges $OT Visit: 1 Procedure OT Treatments $Self Care/Home Management : 23-37 mins  Pilar Grammes 06/19/2014, 11:28 AM

## 2014-06-19 NOTE — Progress Notes (Signed)
D/C orders received, pt for D/C home today.  IV and telemetry D/C.  Rx and D/C instructions given with verbalized understanding.  Family at bedside to assist with D/C.  Staff brought pt downstairs via wheelchair.  

## 2014-06-19 NOTE — Progress Notes (Signed)
CM following for DCP; no needs identified- no HHC or DME needed; Katrina Rivera [phone]

## 2014-06-19 NOTE — Evaluation (Signed)
Physical Therapy Evaluation and Discharge Patient Details Name: Katrina Rivera MRN: [id number] DOB: 09-Jun-1945 Today's Date: 06/19/2014   History of Present Illness  Pt is a 69 y.o. Female with PMH of MS, OA, hypotension,fibromyalgia, now s/p Decompressive lumbar laminectomy L5-S1, Posterior lumbar interbody fusion L5-S1, and Posterior fixation L5-S1 on 06/18/14 for lower back and leg pain.  Clinical Impression  Patient evaluated by Physical Therapy with no further acute PT needs identified. All education has been completed and the patient has no further questions. Ambulates generally well with no loss of balance while using a rolling walker. Reviewed safe mobility techniques including transfers, and pt safely completed stair training. See below for any follow-up Physial Therapy or equipment needs. PT is signing off. Thank you for this referral.     Follow Up Recommendations No PT follow up;Supervision for mobility/OOB    Equipment Recommendations  None recommended by PT    Recommendations for Other Services       Precautions / Restrictions Precautions Precautions: Back Precaution Booklet Issued: Yes (comment) Precaution Comments: Reviewed Required Braces or Orthoses: Spinal Brace Spinal Brace: Lumbar corset Restrictions Weight Bearing Restrictions: No      Mobility  Bed Mobility Overal bed mobility: Needs Assistance Bed Mobility: Rolling;Sidelying to Sit;Sit to Sidelying Rolling: Supervision Sidelying to sit: Supervision     Sit to sidelying: Supervision General bed mobility comments: Supervision for safety, educated on log roll technique. practiced x 2 with cues to maintain back precautions.  Transfers Overall transfer level: Needs assistance Equipment used: Rolling walker (2 wheeled) Transfers: Sit to/from Stand Sit to Stand: Supervision         General transfer comment: Supervision for safety. VC's for hand placement. No loss of balance  noted.  Ambulation/Gait Ambulation/Gait assistance: Supervision Ambulation Distance (Feet): 165 Feet Assistive device: Rolling walker (2 wheeled) Gait Pattern/deviations: Step-through pattern;Decreased stride length Gait velocity: slow Gait velocity interpretation: Below normal speed for age/gender General Gait Details: Small steps, reports baseline gait pattern. demonstrates good control of RW. no loss of balance noted. Decreased rotation of hips. Supervision for safety  Stairs Stairs: Yes Stairs assistance: Min assist Stair Management: No rails;Step to pattern;Forwards Number of Stairs: 2 General stair comments: Hand held assist for balance with instructions for sequencing. Pt reports husband always assists pt in/out of home.  Wheelchair Mobility    Modified Rankin (Stroke Patients Only)       Balance Overall balance assessment: Needs assistance Sitting-balance support: No upper extremity supported;Feet supported Sitting balance-Leahy Scale: Normal     Standing balance support: No upper extremity supported Standing balance-Leahy Scale: Fair                               Pertinent Vitals/Pain Pain Assessment: 0-10 Pain Score: 4  Pain Location: back Pain Descriptors / Indicators: Sore Pain Intervention(s): Monitored during session;Repositioned    Home Living Family/patient expects to be discharged to:: Private residence Living Arrangements: Spouse/significant other Available Help at Discharge: Family;Available 24 hours/day Type of Home: House Home Access: Stairs to enter Entrance Stairs-Rails: None Entrance Stairs-Number of Steps: 1 and 1 Home Layout: One level Home Equipment: Emergency planning/management officer - 4 wheels;Cane - single point;Wheelchair - manual      Prior Function Level of Independence: Independent         Comments: walking without AD     Hand Dominance   Dominant Hand: Right    Extremity/Trunk Assessment   Upper Extremity Assessment:  Defer to OT evaluation           Lower Extremity Assessment: Generalized weakness      Cervical / Trunk Assessment: Normal  Communication   Communication: No difficulties  Cognition Arousal/Alertness: Awake/alert Behavior During Therapy: WFL for tasks assessed/performed Overall Cognitive Status: Within Functional Limits for tasks assessed                      General Comments      Exercises        Assessment/Plan    PT Assessment Patent does not need any further PT services  PT Diagnosis Abnormality of gait;Generalized weakness;Acute pain   PT Problem List    PT Treatment Interventions     PT Goals (Current goals can be found in the Care Plan section) Acute Rehab PT Goals Patient Stated Goal: to go home soon PT Goal Formulation: All assessment and education complete, DC therapy    Frequency     Barriers to discharge        Co-evaluation               End of Session Equipment Utilized During Treatment: Back brace Activity Tolerance: Patient tolerated treatment well Patient left: in bed;with call bell/phone within reach;with family/visitor present Nurse Communication: Mobility status         Time: 9147-8295 PT Time Calculation (min) (ACUTE ONLY): 22 min   Charges:   PT Evaluation $Initial PT Evaluation Tier I: 1 Procedure     PT G CodesBerton Mount 06/19/2014, 9:48 AM Charlsie Merles, PT [phone]

## 2014-06-19 NOTE — Discharge Summary (Signed)
Physician Discharge Summary  Patient ID: CLEON THOMA MRN: [id number] DOB/AGE: [birth year] 69 y.o.  Admit date: 06/18/2014 Discharge date: 06/19/2014  Admission Diagnoses: foraminal stenosis L5-S1   Discharge Diagnoses: same   Discharged Condition: good  Hospital Course: The patient was admitted on 06/18/2014 and taken to the operating room where the patient underwent PLIF L5-S1. The patient tolerated the procedure well and was taken to the recovery room and then to the floor in stable condition. The hospital course was routine. There were no complications. The wound remained clean dry and intact. Pt had appropriate back soreness. No complaints of leg pain or new N/T/W. The patient remained afebrile with stable vital signs, and tolerated a regular diet. The patient continued to increase activities, and pain was well controlled with oral pain medications.   Consults: None  Significant Diagnostic Studies:  Results for orders placed or performed during the hospital encounter of 06/18/14  CBC  Result Value Ref Range   WBC 11.7 (H) 4.0 - 10.5 K/uL   RBC 3.32 (L) 3.87 - 5.11 MIL/uL   Hemoglobin 10.2 (L) 12.0 - 15.0 g/dL   HCT 40.9 (L) 81.1 - 91.4 %   MCV 91.6 78.0 - 100.0 fL   MCH 30.7 26.0 - 34.0 pg   MCHC 33.6 30.0 - 36.0 g/dL   RDW 78.2 95.6 - 21.3 %   Platelets 153 150 - 400 K/uL    Chest 2 View  06/09/2014   CLINICAL DATA:  Preoperative examination prior to lumbar spinal surgery, history of multiple sclerosis an autonomic instability. , nonsmoker.  EXAM: CHEST  2 VIEW  COMPARISON:  PA and lateral chest x-ray dated January 21, 2013  FINDINGS: The lungs are mildly hyperinflated and clear. The heart and pulmonary vascularity are normal. The mediastinum is normal in width. There is no pleural effusion. There surgical clips in the gallbladder fossa. The bony thorax exhibits no acute abnormality.  IMPRESSION: Mild hyperinflation may be voluntary but likely reflects underlying reactive  airway disease. There is no active cardiopulmonary disease.   Electronically Signed   By: Estalene Bergey  [place] M.D.   On: 06/09/2014 09:42   Dg Lumbar Spine 2-3 Views  06/18/2014   CLINICAL DATA:  Intraoperative imaging for lumbar spine fusion.  EXAM: LUMBAR SPINE - 2-3 VIEW; DG C-ARM 61-120 MIN  COMPARISON:  None.  FINDINGS: Two images were submitted. These show pedicle screws interconnecting rods fusing L5-S1. A radiolucent disc spacer maintains disc height at diffuse level. The orthopedic hardware is well-seated and aligned. There is no acute fracture or evidence of an operative complication.  IMPRESSION: Operative imaging for lumbar spine surgery as described.   Electronically Signed   By: Amie Portland M.D.   On: 06/18/2014 12:49   Dg C-arm 1-60 Min  06/18/2014   CLINICAL DATA:  Intraoperative imaging for lumbar spine fusion.  EXAM: LUMBAR SPINE - 2-3 VIEW; DG C-ARM 61-120 MIN  COMPARISON:  None.  FINDINGS: Two images were submitted. These show pedicle screws interconnecting rods fusing L5-S1. A radiolucent disc spacer maintains disc height at diffuse level. The orthopedic hardware is well-seated and aligned. There is no acute fracture or evidence of an operative complication.  IMPRESSION: Operative imaging for lumbar spine surgery as described.   Electronically Signed   By: Amie Portland M.D.   On: 06/18/2014 12:49    Antibiotics:  Anti-infectives    Start     Dose/Rate Route Frequency Ordered Stop   06/18/14 2200  trimethoprim (TRIMPEX) tablet 100 mg  100 mg Oral Daily at bedtime 06/18/14 1442     06/18/14 1800  ceFAZolin (ANCEF) IVPB 1 g/50 mL premix     1 g 100 mL/hr over 30 Minutes Intravenous Every 8 hours 06/18/14 1442 06/19/14 0158   06/18/14 0954  bacitracin 50,000 Units in sodium chloride irrigation 0.9 % 500 mL irrigation  Status:  Discontinued       As needed 06/18/14 0954 06/18/14 1254   06/18/14 0900  ceFAZolin (ANCEF) IVPB 2 g/50 mL premix     2 g 100 mL/hr over 30 Minutes  Intravenous To Surgery 06/17/14 1433 06/18/14 1050      Discharge Exam: Blood pressure 133/48, pulse 72, temperature 98.2 F (36.8 C), temperature source Oral, resp. rate 18, height  (1.549 m), weight 123 lb (55.792 kg), SpO2 98 %. Neurologic: Grossly normal Incision CDI  Discharge Medications:     Medication List    STOP taking these medications        ibuprofen 200 MG tablet  Commonly known as:  ADVIL,MOTRIN      TAKE these medications        ALPRAZolam 1 MG tablet  Commonly known as:  XANAX  Take 1 mg by mouth at bedtime.     bifidobacterium infantis capsule  Take 1 capsule by mouth daily.     calcium-vitamin D 500-200 MG-UNIT per tablet  Commonly known as:  OSCAL WITH D  Take 1 tablet by mouth 2 (two) times daily.     cetirizine 10 MG tablet  Commonly known as:  ZYRTEC  Take 10 mg by mouth daily.     docusate sodium 100 MG capsule  Commonly known as:  COLACE  Take 100 mg by mouth 2 (two) times daily.     DULoxetine 30 MG capsule  Commonly known as:  CYMBALTA  Take 1 capsule (30 mg total) by mouth daily.     fentaNYL 50 MCG/HR  Commonly known as:  DURAGESIC - dosed mcg/hr  Place 1 patch (50 mcg total) onto the skin every 3 (three) days.     Ferrous Sulfate Dried 200 (65 FE) MG Tabs  Take 65 mg by mouth daily.     Fish Oil 1200 MG Caps  Take 2 capsules by mouth daily.     fludrocortisone 0.1 MG tablet  Commonly known as:  FLORINEF  One tablet in the morning and two tablets at night.     fluticasone 50 MCG/ACT nasal spray  Commonly known as:  FLONASE  Place 2 sprays into both nostrils at bedtime as needed for allergies or rhinitis.     hydrocortisone sodium succinate 100 MG Solr injection  Commonly known as:  SOLU-CORTEF  Inject  0.4 mls every morning and 0.59mls every evening.     Insulin Syringe-Needle U-100 31G X 5/16" 0.3 ML Misc  Commonly known as:  B-D INSULIN SYRINGE  Use 2 per day     methocarbamol 500 MG tablet  Commonly known as:   ROBAXIN  Take 1 tablet (500 mg total) by mouth every 6 (six) hours as needed for muscle spasms.     multivitamin with minerals Tabs tablet  Take 1 tablet by mouth daily.     omeprazole 40 MG capsule  Commonly known as:  PRILOSEC  Take 40 mg by mouth daily.     ondansetron 4 MG tablet  Commonly known as:  ZOFRAN  Take 1 tablet every 4 hours as needed for nausea or vomiting.     oxyCODONE-acetaminophen  5-325 MG per tablet  Commonly known as:  PERCOCET/ROXICET  Take 1-2 tablets by mouth every 4 (four) hours as needed for moderate pain.     potassium chloride SA 20 MEQ tablet  Commonly known as:  K-DUR,KLOR-CON  Take 20 mEq by mouth 2 (two) times daily.     potassium chloride 10 MEQ tablet  Commonly known as:  K-DUR,KLOR-CON  Take 3 in the morning and two at bedtime.     potassium chloride 10 MEQ tablet  Commonly known as:  KLOR-CON 10  Take 1 tablet (10 mEq total) by mouth 2 (two) times daily. Taking 3 in the am and 2 in the pm.     SYSTANE OP  Apply 2 drops to eye daily as needed (for dry eyes).     tamsulosin 0.4 MG Caps capsule  Commonly known as:  FLOMAX  Take 0.4 mg by mouth daily after breakfast.     traZODone 150 MG tablet  Commonly known as:  DESYREL  Take 150 mg by mouth at bedtime.     traZODone 100 MG tablet  Commonly known as:  DESYREL  Take 1 tablet at bedtime     trimethoprim 100 MG tablet  Commonly known as:  TRIMPEX  Take 100 mg by mouth at bedtime.        Disposition: home   Final Dx: PLIF L5-s1      Discharge Instructions     Remove dressing in 72 hours    Complete by:  As directed      Call MD for:  difficulty breathing, headache or visual disturbances    Complete by:  As directed      Call MD for:  persistant nausea and vomiting    Complete by:  As directed      Call MD for:  redness, tenderness, or signs of infection (pain, swelling, redness, odor or green/yellow discharge around incision site)    Complete by:  As directed      Call  MD for:  severe uncontrolled pain    Complete by:  As directed      Call MD for:  temperature >100.4    Complete by:  As directed      Diet - low sodium heart healthy    Complete by:  As directed      Discharge instructions    Complete by:  As directed   No bending or twisting, no heavy lifting, no driving, no strenuous activity     Increase activity slowly    Complete by:  As directed            Follow-up Information    Follow up with Parrie Rasco S, MD In 2 weeks.   Specialty:  Neurosurgery   Contact information:   [address] [place] [address] [phone]        Signed: Tia Alert 06/19/2014, 7:56 AM

## 2014-06-19 NOTE — Progress Notes (Signed)
OT Cancellation Note  Patient Details Name: Katrina Rivera MRN: [id number] DOB: [birth year]   Cancelled Treatment:    Reason Eval/Treat Not Completed: Other (comment) (Pt with PT.) OT to reattempt as schedule permits.  Pilar Grammes 06/19/2014, 8:54 AM

## 2014-07-14 ENCOUNTER — Other Ambulatory Visit: Payer: Self-pay | Admitting: *Deleted

## 2014-07-14 ENCOUNTER — Other Ambulatory Visit (INDEPENDENT_AMBULATORY_CARE_PROVIDER_SITE_OTHER): Payer: PPO

## 2014-07-14 ENCOUNTER — Telehealth: Payer: Self-pay | Admitting: *Deleted

## 2014-07-14 DIAGNOSIS — R252 Cramp and spasm: Secondary | ICD-10-CM | POA: Diagnosis not present

## 2014-07-14 DIAGNOSIS — E2740 Unspecified adrenocortical insufficiency: Secondary | ICD-10-CM | POA: Diagnosis not present

## 2014-07-14 LAB — BASIC METABOLIC PANEL
BUN: 17 mg/dL (ref 6–23)
CALCIUM: 9.8 mg/dL (ref 8.4–10.5)
CO2: 30 meq/L (ref 19–32)
Chloride: 98 mEq/L (ref 96–112)
Creatinine, Ser: 0.85 mg/dL (ref 0.40–1.20)
GFR: 70.95 mL/min (ref >60.00)
GLUCOSE: 108 mg/dL — AB (ref 70–99)
POTASSIUM: 4.6 meq/L (ref 3.5–5.1)
SODIUM: 133 meq/L — AB (ref 135–145)

## 2014-07-14 LAB — MAGNESIUM: MAGNESIUM: 2.4 mg/dL (ref 1.5–2.5)

## 2014-07-14 MED ORDER — METHYLPREDNISOLONE 4 MG PO TABS: ORAL_TABLET | ORAL | Status: DC

## 2014-07-14 NOTE — Telephone Encounter (Signed)
Patient said she took her last one today, it was a dose pack.

## 2014-07-14 NOTE — Telephone Encounter (Signed)
If she is tolerating the dose pack better than cortisone tablets and prednisone we can give her a prescription for methylprednisolone 4 mg, take 1 tablet in the morning and half in the evening

## 2014-07-14 NOTE — Telephone Encounter (Signed)
Noted, rx sent, patient is aware. 

## 2014-07-14 NOTE — Telephone Encounter (Signed)
If she is tolerating this well she should continue 4 mg in the morning and 2 mg after supper

## 2014-07-14 NOTE — Telephone Encounter (Signed)
BMP ordered, she said she was on 4 mg of prednisone and just took the last one today.

## 2014-07-14 NOTE — Telephone Encounter (Signed)
BMP. She may need to see PCP if potassium ok. What dose prednisone?

## 2014-07-14 NOTE — Telephone Encounter (Signed)
Patient called, she had her back surgery on May 11th, she said she's having severe leg cramps, she wants to come in and have some labs drawn, Cortisol, etc, She said she has a hard time getting up because her legs are so cramped. She also said the surgeon put her on prednisone.  Please advise if okay to do labs and what labs you want drawn?

## 2014-07-15 ENCOUNTER — Telehealth: Payer: Self-pay | Admitting: *Deleted

## 2014-07-15 ENCOUNTER — Telehealth: Payer: Self-pay | Admitting: Neurology

## 2014-07-15 NOTE — Telephone Encounter (Signed)
She had back surgery on 5/11/6.  She feels like Fentanyl patches are not helpful for her back pain.  She is also having more problems with leg cramps.  She has been scheduled for an earlier appointment.

## 2014-07-15 NOTE — Telephone Encounter (Signed)
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  She can discuss this in the office if needed

## 2014-07-15 NOTE — Telephone Encounter (Signed)
Patient called, she states she wants to come off all of her steroids, solu-cortef, methylprednisolone, etc,  She said she realizes she will have to taper down.  Please advise.

## 2014-07-15 NOTE — Telephone Encounter (Signed)
Patient called wanting to know how she can come off the fentaNYL (DURAGESIC - DOSED MCG/HR) 50 MCG/HR. She no longer wants to be taking this script. Please call and advise. Patient can be reached @ [phone]

## 2014-07-16 NOTE — Telephone Encounter (Signed)
Pt is stopping the cortisol inj and is starting the pills we called into [facility]

## 2014-07-16 NOTE — Telephone Encounter (Signed)
Patient is adamant, she is coming off the medication and wants to see what happens, she said she has not noticed any difference since starting this medication.

## 2014-07-16 NOTE — Telephone Encounter (Signed)
FYI  Please see below

## 2014-07-17 ENCOUNTER — Encounter: Payer: Self-pay | Admitting: *Deleted

## 2014-07-17 MED ORDER — CYCLOBENZAPRINE HCL 10 MG PO TABS
10.0000 mg | ORAL_TABLET | Freq: Three times a day (TID) | ORAL | Status: DC | PRN
Start: 2014-07-17 — End: 2014-07-23

## 2014-07-17 NOTE — Telephone Encounter (Signed)
Patient aware and will pick up rx.

## 2014-07-17 NOTE — Telephone Encounter (Signed)
Patient called stating she is having severe leg cramps. She is inquiring if she could have a muscle relaxer to try to help until Wednesday. Please call and advise. Patient can be reached at [phone] and [phone].

## 2014-07-17 NOTE — Telephone Encounter (Addendum)
Spoke to patient - methocarbamol is not helpful - she is unable to tolerate baclofen - cyclobenzaprine has worked in the past.  I have written Flexeril 10 mg 3 times a day, please let patient know

## 2014-07-17 NOTE — Addendum Note (Signed)
Addended by: Levert Feinstein on: 07/17/2014 12:33 PM   Modules accepted: Orders

## 2014-07-23 ENCOUNTER — Encounter: Payer: Self-pay | Admitting: Neurology

## 2014-07-23 ENCOUNTER — Ambulatory Visit (INDEPENDENT_AMBULATORY_CARE_PROVIDER_SITE_OTHER): Payer: PPO | Admitting: Neurology

## 2014-07-23 VITALS — BP 153/94 | HR 90 | Ht 61.0 in | Wt 127.0 lb

## 2014-07-23 DIAGNOSIS — R269 Unspecified abnormalities of gait and mobility: Secondary | ICD-10-CM

## 2014-07-23 DIAGNOSIS — G894 Chronic pain syndrome: Secondary | ICD-10-CM

## 2014-07-23 DIAGNOSIS — G35 Multiple sclerosis: Secondary | ICD-10-CM | POA: Diagnosis not present

## 2014-07-23 MED ORDER — CYCLOBENZAPRINE HCL 10 MG PO TABS: 10.0000 mg | ORAL_TABLET | Freq: Three times a day (TID) | ORAL | Status: DC | PRN

## 2014-07-23 MED ORDER — TIZANIDINE HCL 2 MG PO TABS
2.0000 mg | ORAL_TABLET | Freq: Three times a day (TID) | ORAL | Status: DC
Start: 2014-07-23 — End: 2014-12-11

## 2014-07-23 NOTE — Progress Notes (Signed)
Chief Complaint  Patient presents with  . Multiple Sclerosis    She has been having severe muscle cramps and weakness in bilateral legs.  She had lumbar surgery on 06/18/14.     [facility]  PATIENT: Katrina Rivera DOB: 02-27-1945   REASON FOR VISIT: follow up for MS, abnormal gait, neuropathic pain, RLS  HISTORY OF PRESENT ILLNESS:Katrina Rivera is a very pleasant 69 year-old right-handed woman, with relapsing remitting multiple sclerosis, was treated with Betaseron 4 -5 years, but could not tolerate the side effect, now is not on any immunomodulation therapy. neuropathic pain of bilateral lower extremities.   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   She has baseline gait difficulty, she fell in May 2013, fracture in both elbows and her left kneecap.  She intermittently has been using a cane or walker.  She could not tolerate Requip because of nausea. She has been on gabapentin 300 mg 3 bid. She gained significant weight when on Lyrica, and therefore was switched back to gabapentin. She has orthostatic hypotension, and is on midodrine 2.5 mg and fludrocortisone 0.1 mg 1 in AM and 2 at night, and she is also on 20 mg of hydrocortisone.  She complains of bilateral lower extremity burning stinging sensation, getting worse when she sits still, difficulty falling into sleep, she has the urge to move because of bilateral extremity discomfort, has tried Requip, could not tolerate it because  of GI side effects, Neuprol patch does not work either, she also tried Elavil, Cymbalta in the past, could not tolerate it due to side effect.  Jan 01/2014:  She came in urgently for acute worsening of her generalized condition since her lumbar decompression surgery in January 24 2013 by Dr. Marikay Alar, prior to surgery, she suffered left-sided low back pain, radiating pain to her left lower extremity, left is much worse than her right side, there was left L5-S1 extraforaminal herniated nucleus pulposus with left L5 radiculopathy she had left L5-S1 extraforaminal microdiscectomy utilizing microscopic dissection.  She had overnight stay at the hospital the day after surgery, she fell when trying to get up using bathroom, landed on her left side, ever since surgery, her lower back pain, radiating pain to her left leg has much improved, but she complained of worsening bilateral lower extremity deep achy pain, worsening gait difficulty, she has not used walker for 3 years, now she began to use her walker, she felt her skin is so tight at bilateral lower extremity, left leg swelling, also has difficulty swallowing, blurred vision, worsening fatigue, difficulty concentration, she has not had MRI evaluation for her multiple sclerosis for many years, is not on any immunomodulation therapy  UPDATE Mar 01, 2013:  She came in with a list of complains, much worse compared to presurgical level. She complains of blurred vision, difficulty sleeping, difficulty concentrating, dizziness, worsening gait difficulty, The most bothersome symptoms are bilateral lower extremity pain, constant, knife cutting pain, left worse than right she has been dealing with her bilateral lower extremity pain for many years, much worse  recently,  Left leg showed no DVT on doppler study. She had 15 LB weight gain over one month, more difficulty sleepy, body shaking, more difficulty sleepy.  She has tried fentanyl patch 50 mcg, Trileptal,  Neurontin without helping her symptoms .She is going to be seen by her surgeon Dr. Yetta Barre in 3 days  We have revealed MRI of the brain cervical lumbar spine together done at [facility] in January 2015  At L5-S1: disc bulging and facet hypertrophy, status post microdiscectomy on the left, with expected post-surgical changes. Multi-level facet hypertrophy, with no spinal stenosis or foraminal narrowing  Possible small chronic demyelinating plaque at C6 on the right side. No abnormal enhancing lesions  Multiple supratentorial and 1 infratentorial chronic demyelinating plaques. No acute plaques. Mild cerebellar tonsillar ectopia  We also reviewed, and compared to her previous MRI in 2010 from [facility], there was no significant change in the brain lesions,   UPDATE 08/22/13 Patient returns for follow up. Her biggest  complaint today is restless legs. She has never tried Neupro. She has had multiple trials of medications for her chronic pain. She is currently taking fentanyl, Trileptal, Cymbalta, lidocaine gel  UPDATE Feb 27 2014:  She is not on any long-term immunomodulation therapy for her relapsing remitting multiple sclerosis, neurological deficit has been fairly stable, she has baseline gait difficulty, the most bothersome symptoms for her is low back pain, bilateral lower extremity deep achy pain, radiating pain from left lower back to her left leg, nex  She was recently found to have very low cortisol level, under endocrinologist Dr. Remus Blake care,  UPDATE July 23 2014:  She had lumbar decompression by Dr. Marikay Alar in Jun 18 2014, which did help her low back pain, but now she experienced worsening left lower extremity spasm, left calf spasm so hard, as if a knot was tied, difficulty walking, she was giving a steroid package, no help,  She complained excessive weight gain with Lyrica, Neurontin did not help,  REVIEW OF SYSTEMS: Full 14 system review of systems performed and notable  only for those listed, all others are neg:  Appetite change, fatigue, no running nose, constipation, diarrhea, nausea, insomnia, frequent wakening, daytime sleepiness, snoring, difficulty urinating, back pain, achy muscles, dizziness, headaches, numbness, weakness, depression  ALLERGIES: No Known Allergies  HOME MEDICATIONS: Outpatient Prescriptions Prior to Visit  Medication Sig Dispense Refill  . ALPRAZolam (XANAX) 1 MG tablet Take 1 mg by mouth at bedtime.    . bifidobacterium infantis (ALIGN) capsule Take 1 capsule by mouth daily.    . calcium-vitamin D (OSCAL WITH D) 500-200 MG-UNIT per tablet Take 1 tablet by mouth 2 (two) times daily.    . cetirizine (ZYRTEC) 10 MG tablet Take 10 mg by mouth daily.    . cyclobenzaprine (FLEXERIL) 10 MG tablet Take 1 tablet (10 mg total) by mouth 3 (three) times daily as needed for muscle spasms. 90 tablet 1  . docusate sodium (COLACE) 100 MG capsule Take 100 mg by mouth 2 (two) times daily.    . DULoxetine (CYMBALTA) 30 MG capsule Take 1 capsule (30 mg total) by mouth daily. 90 capsule 3  . fentaNYL (DURAGESIC - DOSED MCG/HR) 50 MCG/HR Place 1 patch (50 mcg total) onto the skin every 3 (three) days. 30 patch 0  . Ferrous Sulfate Dried 200 (65 FE) MG TABS Take 65 mg by mouth daily.    . fludrocortisone (FLORINEF) 0.1 MG tablet One tablet in the morning and two tablets at  night. (Patient taking differently: Take 0.1 mg by mouth 2 (two) times daily. ) 270 tablet 3  . fluticasone (FLONASE) 50 MCG/ACT nasal spray Place 2 sprays into both nostrils at bedtime as needed for allergies or rhinitis.    . methylPREDNISolone (MEDROL) 4 MG tablet Take 1 tablet in the morning and 1/2 tablet in the evening. 35 tablet 3  . Multiple Vitamin (MULITIVITAMIN WITH MINERALS) TABS Take 1 tablet by mouth daily.    Marland Kitchen omeprazole (PRILOSEC) 40 MG capsule Take 40 mg by mouth daily.     . ondansetron (ZOFRAN) 4 MG tablet Take 1 tablet every 4 hours as needed for nausea or vomiting.  50 tablet 1  . oxyCODONE-acetaminophen (PERCOCET/ROXICET) 5-325 MG per tablet Take 1-2 tablets by mouth every 4 (four) hours as needed for moderate pain. 90 tablet 0  . Polyethyl Glycol-Propyl Glycol (SYSTANE OP) Apply 2 drops to eye daily as needed (for dry eyes).    . potassium chloride (K-DUR,KLOR-CON) 10 MEQ tablet Take 3 in the morning and two at bedtime. 540 tablet 3  . tamsulosin (FLOMAX) 0.4 MG CAPS capsule Take 0.4 mg by mouth daily after breakfast.    . traZODone (DESYREL) 150 MG tablet Take 150 mg by mouth at bedtime.    Marland Kitchen trimethoprim (TRIMPEX) 100 MG tablet Take 100 mg by mouth at bedtime.     . hydrocortisone sodium succinate (SOLU-CORTEF) 100 MG SOLR injection Inject  0.4 mls every morning and 0.71mls every evening. 30 each 3  . Insulin Syringe-Needle U-100 (B-D INSULIN SYRINGE) 31G X 5/16" 0.3 ML MISC Use 2 per day 100 each 3  . Omega-3 Fatty Acids (FISH OIL) 1200 MG CAPS Take 2 capsules by mouth daily.    . potassium chloride (KLOR-CON 10) 10 MEQ tablet Take 1 tablet (10 mEq total) by mouth 2 (two) times daily. Taking 3 in the am and 2 in the pm. (Patient taking differently: Take 10 mEq by mouth 2 (two) times daily. Taking 2 in the am and 2 in the pm.) 450 tablet 1  . potassium chloride SA (K-DUR,KLOR-CON) 20 MEQ tablet Take 20 mEq by mouth 2 (two) times daily.    . traZODone (DESYREL) 100 MG tablet Take 1 tablet at bedtime 45 tablet 0   No facility-administered medications prior to visit.    PAST MEDICAL HISTORY: Past Medical History  Diagnosis Date  . Fibromyalgia   . Restless leg syndrome   . Multiple sclerosis     doesn't take any meds  . Arthritis   . Osteoporosis   . Hypotension   . Headache(784.0)   . GERD (gastroesophageal reflux disease)   . Anxiety     takes Xanax nightly  . IBS (irritable bowel syndrome)     takes Librarian, academic daily  . Seasonal allergies     takes Zyrtec daily;uses Flonase daily as needed  . Hypotension     takes Florinef daily  . History of  bronchitis     many yrs ago   . Dizziness     if b/p drops   . Peripheral neuropathy   . Weakness     numbness and tingling  . Joint pain   . Chronic back pain     stenosis  . Urinary frequency     takes Flomax daily  . Nocturia   . Anemia     takes Ferrous Sulfate daily  . History of blood transfusion     no abnormal reaction noted  . Addison's disease  takes Solu Cortef daily  . Hypokalemia     takes Potassium daily  . Depression     takes Cymbalta daily  . Insomnia     takes Trazodone nightly    PAST SURGICAL HISTORY: Past Surgical History  Procedure Laterality Date  . Cesarean section  1973/1977    x2  . Eye surgery      bilateral - /w IOL  . Lumbar laminectomy/decompression microdiscectomy Left 01/24/2013    Procedure: LUMBAR FIVE TO SACRAL ONE LUMBAR LAMINECTOMY/DECOMPRESSION MICRODISCECTOMY 1 LEVEL;  Surgeon: Tia Alert, MD;  Location: [facility];  Service: Neurosurgery;  Laterality: Left;  . Fracture surgery Right     rods and screws  . Cholecystectomy  1997  . Abdominal hysterectomy  1988  . Appendectomy  1988  . Colectomy  1990  . Colonoscopy    . Esophagogastroduodenoscopy    . Maximum access (mas)posterior lumbar interbody fusion (plif) 1 level N/A 06/18/2014    Procedure: MAXIMUM ACCESS SURGERY POSTERIOR LUMBAR INTERBODY FUSION LUMBAR FIVE TO SACRAL ONE ;  Surgeon: Tia Alert, MD;  Location: [facility];  Service: Neurosurgery;  Laterality: N/A;    FAMILY HISTORY: Family History  Problem Relation Age of Onset  . Hypertension Mother   . Stroke Mother   . Heart attack Father     SOCIAL HISTORY: History   Social History  . Marital Status: Married    Spouse Name: Gabriel Rung  . Number of Children: 2  . Years of Education: 12   Occupational History  .      Disabled   Social History Main Topics  . Smoking status: Never Smoker   . Smokeless tobacco: Never Used  . Alcohol Use: No  . Drug Use: No  . Sexual Activity: Not on file    Other Topics Concern  . Not on file   Social History Narrative   Pt lives at home with spouse. ([name])   Caffeine Use: 1 cups daily.   Right handed.   Disabled.   Education - high school   Patient has two adult children.     PHYSICAL EXAM  Filed Vitals:   07/23/14 1559  BP: 153/94  Pulse: 90  Height:  (1.549 m)  Weight: 127 lb (57.607 kg)   Body mass index is 24.01 kg/(m^2). Generalized: In no acute distress  Neck: Supple, no carotid bruits  Cardiac: Regular rate rhythm  Pulmonary: Clear to auscultation bilaterally  Musculoskeletal: No deformity  Neurological examination  Mentation:  oriented to time, place, history taking, and causual conversation  Cranial nerve II-XII: Pupils were equal round reactive to light extraocular movements were full, visual field were full on confrontational test. facial sensation and strength were normal. hearing was intact to finger rubbing bilaterally. Uvula tongue midline. head turning and shoulder shrug and were normal and symmetric.Tongue protrusion into cheek strength was normal.  Motor:  bilateral lower extremity spasticity, she has mild left hip flexion, knee flexion, left ankle dorsi flexion weakness  Sensory: Intact to fine touch, pinprick, preserved vibratory sensation, and proprioception at toes.  Coordination: Normal finger to nose, heel-to-shin bilaterally  Gait: wide based, mild circumerferential, dragging her left leg, unsteady gait, no assistive device Romberg signs: Negative  Deep tendon reflexes: Brachioradialis 2/2, biceps 2/2, triceps 2/2, patellar 3/3, Achilles 2/2, plantar responses were flexor bilaterally.  DIAGNOSTIC DATA (LABS, IMAGING, TESTING) - I reviewed patient records, labs, notes, testing and imaging myself where available.  Lab Results  Component Value Date  WBC 11.7* 06/19/2014   HGB 10.2* 06/19/2014   HCT 30.4* 06/19/2014   MCV 91.6 06/19/2014   PLT 153 06/19/2014      Component Value Date/Time    NA 133* 07/14/2014 1111   K 4.6 07/14/2014 1111   CL 98 07/14/2014 1111   CO2 30 07/14/2014 1111   GLUCOSE 108* 07/14/2014 1111   BUN 17 07/14/2014 1111   CREATININE 0.85 07/14/2014 1111   CREATININE 0.85 11/06/2013 0840   CALCIUM 9.8 07/14/2014 1111   PROT 6.5 02/10/2014 0838   ALBUMIN 4.0 02/10/2014 0838   AST 17 02/10/2014 0838   ALT 14 02/10/2014 0838   ALKPHOS 37* 02/10/2014 0838   BILITOT 0.3 02/10/2014 0838   GFRNONAA 86* 01/21/2013 0956   GFRAA >90 01/21/2013 0956    Lab Results  Component Value Date   TSH 2.75 06/09/2014   ASSESSMENT AND PLAN  69 y.o. year old female   1.relapsing remediating multiple sclerosis, not on long term immunomodulation therapy 2. Lumbar radiculopathy, left leg pain, fentanyl patch is no longer helping her, will stop, Flexeril 10 mg twice a day has been helpful, will try tizanidine 2 mg 3 times a day for her left lower extremity muscle spasm. 3. She is to continue follow-up with neurosurgeon Dr. Marikay Alar and potential pain management for her chronic leg pain, low back pain,  Continue current meds, Cymbalta Trileptal, fentanyl, lidocaine.  No orders of the defined types were placed in this encounter.    New Prescriptions   TIZANIDINE (ZANAFLEX) 2 MG TABLET    Take 1 tablet (2 mg total) by mouth 3 (three) times daily.   Return in about 3 months (around 10/23/2014). Levert Feinstein, M.D. Ph.D.  [facility] [address] [place], [place] [address] Phone: [phone] Fax:      [phone]

## 2014-07-28 ENCOUNTER — Encounter: Payer: Self-pay | Admitting: *Deleted

## 2014-07-28 ENCOUNTER — Telehealth: Payer: Self-pay | Admitting: Neurology

## 2014-07-28 ENCOUNTER — Other Ambulatory Visit: Payer: Self-pay

## 2014-07-28 MED ORDER — CYCLOBENZAPRINE HCL 10 MG PO TABS: 10.0000 mg | ORAL_TABLET | Freq: Three times a day (TID) | ORAL | Status: DC | PRN

## 2014-07-28 NOTE — Telephone Encounter (Signed)
Pharmacy says patient only wishes to fill this Rx for a quantity of #270.  Forwarded to provider for review.

## 2014-07-28 NOTE — Telephone Encounter (Addendum)
She had to restart her Fentanyl patches yesterday because her pain worsened without it.  She has talked to Dr. Yetta Barre and she is waiting for a pain management appointment with his office.  She is requesting a renewal of her compound cream.  If you are agreeable to the refill, I have the renewal form ready to be signed and can fax to [facility].   Yes, agree refill on compound cream. Levert Feinstein, M.D. Ph.D.  [facility] [address] [place], [place] [address] Phone: [phone] Fax:      [phone]

## 2014-07-28 NOTE — Telephone Encounter (Signed)
Patient called and requested to speak with Marcelino Duster RN regarding her medication FENTANYL Patch. She would like to know if there is something else she can take to help with the pain. Please call and advise.

## 2014-07-28 NOTE — Telephone Encounter (Signed)
Rx signed, faxed and confirmed to [facility] at [phone].

## 2014-07-29 ENCOUNTER — Telehealth: Payer: Self-pay | Admitting: Neurology

## 2014-07-29 NOTE — Telephone Encounter (Signed)
Dosage verified with Jasmine December in their pharmacy department.

## 2014-07-29 NOTE — Telephone Encounter (Signed)
Kim with [facility] called needing doseage for compound prescribed yesterday. Please call and advise.  She can be reached at [phone].

## 2014-07-31 ENCOUNTER — Telehealth: Payer: Self-pay

## 2014-07-31 NOTE — Addendum Note (Signed)
Addended by: Lucille Passy C on: 07/31/2014 01:00 PM   Modules accepted: Medications

## 2014-07-31 NOTE — Telephone Encounter (Signed)
Thank you.  Pharmacy has been notified.  As well, Rx has been updated on med list.

## 2014-07-31 NOTE — Telephone Encounter (Addendum)
Sam from [facility] would like to know if they can change the compound cream to a different formulation for ins co-pay purposes.  They would like to change it to: Meloxicam 0.5%, Gabapentin 6%, Lidocaine 2%, Prilocaine 2%. If this is permissible, I will be happy to notify the pharmacy.  Please advise.  Thank you.    Yes, it is ok to change per suggestion. Levert Feinstein, M.D. Ph.D.  [facility] [address] [place], [place] [address] Phone: [phone] Fax:      [phone]

## 2014-08-04 ENCOUNTER — Other Ambulatory Visit: Payer: Self-pay

## 2014-08-08 ENCOUNTER — Other Ambulatory Visit (INDEPENDENT_AMBULATORY_CARE_PROVIDER_SITE_OTHER): Payer: PPO

## 2014-08-08 DIAGNOSIS — E2740 Unspecified adrenocortical insufficiency: Secondary | ICD-10-CM

## 2014-08-08 LAB — BASIC METABOLIC PANEL
BUN: 15 mg/dL (ref 6–23)
CALCIUM: 9.3 mg/dL (ref 8.4–10.5)
CHLORIDE: 101 meq/L (ref 96–112)
CO2: 31 meq/L (ref 19–32)
CREATININE: 0.96 mg/dL (ref 0.40–1.20)
GFR: 61.64 mL/min (ref >60.00)
Glucose, Bld: 86 mg/dL (ref 70–99)
POTASSIUM: 4.6 meq/L (ref 3.5–5.1)
Sodium: 138 mEq/L (ref 135–145)

## 2014-08-13 ENCOUNTER — Other Ambulatory Visit: Payer: Self-pay | Admitting: *Deleted

## 2014-08-13 ENCOUNTER — Telehealth: Payer: Self-pay | Admitting: Endocrinology

## 2014-08-13 ENCOUNTER — Ambulatory Visit (INDEPENDENT_AMBULATORY_CARE_PROVIDER_SITE_OTHER): Payer: PPO | Admitting: Endocrinology

## 2014-08-13 ENCOUNTER — Encounter: Payer: Self-pay | Admitting: Endocrinology

## 2014-08-13 VITALS — BP 116/64 | HR 98 | Temp 97.9°F | Resp 16 | Ht 61.0 in | Wt 129.0 lb

## 2014-08-13 DIAGNOSIS — D509 Iron deficiency anemia, unspecified: Secondary | ICD-10-CM

## 2014-08-13 DIAGNOSIS — E2740 Unspecified adrenocortical insufficiency: Secondary | ICD-10-CM | POA: Diagnosis not present

## 2014-08-13 DIAGNOSIS — G903 Multi-system degeneration of the autonomic nervous system: Secondary | ICD-10-CM | POA: Diagnosis not present

## 2014-08-13 DIAGNOSIS — I951 Orthostatic hypotension: Secondary | ICD-10-CM

## 2014-08-13 MED ORDER — METHYLPREDNISOLONE 4 MG PO TABS
ORAL_TABLET | ORAL | Status: DC
Start: 2014-08-13 — End: 2014-10-22

## 2014-08-13 MED ORDER — METHYLPREDNISOLONE 4 MG PO TABS: ORAL_TABLET | ORAL | Status: DC

## 2014-08-13 MED ORDER — POTASSIUM CHLORIDE CRYS ER 10 MEQ PO TBCR: EXTENDED_RELEASE_TABLET | ORAL | Status: DC

## 2014-08-13 NOTE — Progress Notes (Signed)
Patient ID: Katrina Rivera, female   DOB: [birth year], 69 y.o.   MRN: [id number]   Subjective:     Chief complaint: Follow-up of adrenal insufficiency   PAST history: She has had long-standing problems with orthostatic hypotension and also hyponatremia. Has been diagnosed with dysautonomia and has multiple other problems related to autonomic neuropathy.   She has been on Florinef since 2004 previously taking 3 tablets daily along with 5 tablets of potassium which had previously controlled her symptoms well . Because of persistent orthostatic symptoms she had been tried on midodrine on 05/28/12 but this was later stopped when blood pressure increased.  She  had medication adjustments done frequently over the last year for regulating her blood pressure.  RECENT history:  AUTONOMIC dysfunction:  She is on Florinef bid 1 tablet a day since about 07/23/14.   Although previously was taking only 1 tablet daily she says she started feeling a little dizzy one day and her standing blood pressure was 99 systolic.  She started increasing her Florinef on her own She has no lightheadedness when standing up recently She has checked her blood pressure periodically and this has been somewhat variable, occasionally as high as 140/84 sitting Standing blood pressure about 124 when she checked it about 10 days ago Her potassium has been normal with taking 4 potassium tablets   Lab Results  Component Value Date   CREATININE 0.96 08/08/2014   BUN 15 08/08/2014   NA 138 08/08/2014   K 4.6 08/08/2014   CL 101 08/08/2014   CO2 31 08/08/2014     Adrenal Insufficiency:   This is secondary to pituitary dysfunction  Prior testing included Cortrosyn test baseline with stimulated level of 15.7, baseline 3.9.  Also confirmed by 24 hour urine free cortisol which was only 3.  Although previously she had tolerated oral hydrocortisone and small doses with improvement in her symptoms she did not continue this  long-term She was again symptomatic with weight loss, decreased appetite, nausea and also diarrhea Cortrosyn stimulation test on 03/12/14 showed baseline cortisol level of 0.3 and post injection of 1.1 only  She had GI side effects from hydrocortisone and prednisone and was given hydrocortisone injections using insulin syringe  After her back surgery she was off her hydrocortisone injections and given methylprednisolone Dosepak by her neurosurgeon which she did tolerate well orally She is now taking 4 mg in the mornings and 2 mg the evening  Although overall she feels fairly good she thinks she is getting increased appetite and gaining weight and is concerned about continuing her steroid supplement.  She still has some limited ability to ambulate with some back pain  Wt Readings from Last 3 Encounters:  08/13/14 129 lb (58.514 kg)  07/23/14 127 lb (57.607 kg)  06/18/14 123 lb (55.792 kg)    General Endocrinology:    Pituitary: She has had a low normal free T4  but did not tolerate thyroxine supplementation in the past which were tried because of her continued symptoms of fatigue Has not had any abnormalities of the pituitary on previous MRI  Lab Results  Component Value Date   TSH 2.75 06/09/2014   TSH 1.60 02/10/2014   TSH 0.65 08/05/2013   FREET4 0.64 06/09/2014   FREET4 0.66 02/10/2014   FREET4 0.78* 11/06/2013            Medication List       This list is accurate as of: 08/13/14 12:09 PM.  Always use your most  recent med list.               ALPRAZolam 1 MG tablet  Commonly known as:  XANAX  Take 1 mg by mouth at bedtime.     bifidobacterium infantis capsule  Take 1 capsule by mouth daily.     calcium-vitamin D 500-200 MG-UNIT per tablet  Commonly known as:  OSCAL WITH D  Take 1 tablet by mouth 2 (two) times daily.     cetirizine 10 MG tablet  Commonly known as:  ZYRTEC  Take 10 mg by mouth daily.     cyclobenzaprine 10 MG tablet  Commonly known as:   FLEXERIL  Take 1 tablet (10 mg total) by mouth 3 (three) times daily as needed for muscle spasms.     docusate sodium 100 MG capsule  Commonly known as:  COLACE  Take 100 mg by mouth 2 (two) times daily.     DULoxetine 30 MG capsule  Commonly known as:  CYMBALTA  Take 1 capsule (30 mg total) by mouth daily.     fentaNYL 50 MCG/HR  Commonly known as:  DURAGESIC - dosed mcg/hr  Place 50 mcg onto the skin every 3 (three) days.     Ferrous Sulfate Dried 200 (65 FE) MG Tabs  Take 65 mg by mouth daily.     fludrocortisone 0.1 MG tablet  Commonly known as:  FLORINEF  One tablet in the morning and two tablets at night.     fluticasone 50 MCG/ACT nasal spray  Commonly known as:  FLONASE  Place 2 sprays into both nostrils at bedtime as needed for allergies or rhinitis.     methylPREDNISolone 4 MG tablet  Commonly known as:  MEDROL  Take 1/2 tablet twice a day     multivitamin with minerals Tabs tablet  Take 1 tablet by mouth daily.     NONFORMULARY OR COMPOUNDED ITEM  Meloxicam 0.5%, Gabapentin 6%, Lidocaine 2%, Prilocaine 2% Appt 1-2 grams to the affected area(s) 3-4 times daily     omeprazole 40 MG capsule  Commonly known as:  PRILOSEC  Take 40 mg by mouth daily.     ondansetron 4 MG tablet  Commonly known as:  ZOFRAN  Take 1 tablet every 4 hours as needed for nausea or vomiting.     oxyCODONE-acetaminophen 5-325 MG per tablet  Commonly known as:  PERCOCET/ROXICET  Take 1-2 tablets by mouth every 4 (four) hours as needed for moderate pain.     potassium chloride 10 MEQ tablet  Commonly known as:  K-DUR,KLOR-CON  Take 2 in the morning and two at bedtime.     SYSTANE OP  Apply 2 drops to eye daily as needed (for dry eyes).     tamsulosin 0.4 MG Caps capsule  Commonly known as:  FLOMAX  Take 0.4 mg by mouth daily after breakfast.     tiZANidine 2 MG tablet  Commonly known as:  ZANAFLEX  Take 1 tablet (2 mg total) by mouth 3 (three) times daily.     traZODone 150 MG  tablet  Commonly known as:  DESYREL  Take 150 mg by mouth at bedtime.     trimethoprim 100 MG tablet  Commonly known as:  TRIMPEX  Take 100 mg by mouth at bedtime.     UNABLE TO FIND  Med Name: Compound Cream: meloxicam 0.5%, doxepin 3%, amantadine 3%, dextromethorphan 2%, lidocaine 2% ([employer] [phone])        Review of Systems   Still  has increased anxiety and depression and may sometimes take Tylenol PM in addition to trazodone for insomnia  Current information is a copy of the previous note:  Increased sweating: Has had hot flashes and periodic sweating in the daytime  Did not tolerate propantheline. Had been slightly better with Climara 0.1 mg and subsequently  taking Vivelle which she stopped because of cost. Effexor was also tried but she did not tolerate this and also sweating was no better She was also tried on clonidine patches without much relief She was retried on hormone patches but she stopped this because he felt it was causing drowsiness She does not complain of excessive symptoms now  Genitourinary: Positive for difficulty urinating.       She was taking urecholine for her  neurogenic bladder  but stopped this because of excessive salivation Neurological: Positive for numbness.       Has peripheral neuropathy, symptomatic of unclear etiology and followed by neurologist  She has had significant amount of neuropathic pain in her feet and legs and was not relieved with large doses of gabapentin.  She is on Cymbalta and Trileptal and also local lidocaine     Still having occasional diarrhea, followed by GI    Objective:   Physical Exam  BP 116/64 mmHg  Pulse 98  Temp(Src) 97.9 F (36.6 C)  Resp 16  Ht 5\' 1"  (1.549 m)  Wt 129 lb (58.514 kg)  BMI 24.39 kg/m2  SpO2 94%  Repeat blood pressure standing is  116/64 No supraclavicular fat pads or facial changes to suggest Cushing's  No pedal edema  Assessment:      Adrenal  insufficiency: 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 and very low cortisol levels on the stimulation test  She has been able take methylprednisolone now orally without GI side effects She has however started to gain weight and she complains of increased hunger  For now we'll reduce her dose to 2 mg twice a day and follow-up in 2 months Discussed that because of her marked decrease in cortisol levels he may not be able to get off steroid supplements at any time soon    Orthostatic dysautonomic hypotension syndrome:   Her blood pressure appears well controlled with 1 Florinef tablet twice a day now although at home may have high normal sitting blood pressure readings at times  Potassium has been high normal at about 4.6   Plan:      She can reduce potassium to 3 tablets Reduce methylprednisolone as above  Fatigue: This is improving     Latha Staunton

## 2014-08-13 NOTE — Patient Instructions (Addendum)
Prednisone 1/2 tab,  2x daily  Potassium 3 days

## 2014-08-13 NOTE — Telephone Encounter (Signed)
Pt called and wants to make sure the prescriptions Dr. Lucianne Muss ordered today is called in to [facility] on [phone], please call pt back and let her know [phone] or cell [phone]

## 2014-08-18 ENCOUNTER — Telehealth: Payer: Self-pay | Admitting: Neurology

## 2014-08-18 NOTE — Telephone Encounter (Signed)
Spoke to patient - she has been experiencing increased sleepiness.  I verified her medications and she has been taking both cyclobenzaprine and tizanidine together three times daily, whether she has spasms or not.  She is going to try to just take tizanidine only and take on a prn basis rather than scheduled. I ask her to call us back if problems persist.

## 2014-08-18 NOTE — Telephone Encounter (Signed)
Patient called stating she is taking the  cyclobenzaprine (FLEXERIL) 10 MG tablet and tiZANidine (ZANAFLEX) 2 MG tablet .She is her very sleepy and forgetful. Please call and advise. Patient can be reached at [phone] and [phone].

## 2014-08-26 ENCOUNTER — Ambulatory Visit: Payer: PPO | Admitting: Nurse Practitioner

## 2014-08-28 ENCOUNTER — Telehealth: Payer: Self-pay | Admitting: Neurology

## 2014-08-28 NOTE — Telephone Encounter (Signed)
Patient called and requested to speak with the nurse regarding some issues she is experiencing. She believes she may be having a MS attack. Please call and advise.

## 2014-08-28 NOTE — Telephone Encounter (Signed)
Katrina Rivera reports after working in her yard for three days in the 90+degree weather and humidity, she started feeling more fatigued and off balance.  She also noted the tremors that are normally present in her hands became a little worse.  Spoke to Dr. Epimenio Foot who felt this was more related to heat exposure.  Instructed her to stay out of the heat and rest.  I ask her to call us back if symptoms do not resolve or worsen.  She was in agreement to this plan.

## 2014-08-29 ENCOUNTER — Telehealth: Payer: Self-pay | Admitting: Endocrinology

## 2014-08-29 NOTE — Telephone Encounter (Signed)
Pt advised of MD's instructions below and voiced understanding.

## 2014-08-29 NOTE — Telephone Encounter (Signed)
i reviewed records.  It is fine to reduce florinef to 1.5 tabs per day, but please do not reduce the methylprednisolone

## 2014-08-29 NOTE — Telephone Encounter (Signed)
Please advise below.

## 2014-08-29 NOTE — Telephone Encounter (Signed)
Pt calling the bp pills florinef, can she break one tab in half and just do 1.5 tabs instead of 2 tabs per day? Pt takes methylpredinisone she also feels that something is off in her body and is asking if she should take this 1.5 mg as well

## 2014-09-01 NOTE — Telephone Encounter (Signed)
Patient called stating she is staggering and having the same issues a when she called on 08/28/14. Please call and advise.

## 2014-09-01 NOTE — Telephone Encounter (Signed)
Spoke to [place] - she will be worked into our schedule - need to call her back in the morning to confirm appt time.

## 2014-09-02 ENCOUNTER — Telehealth: Payer: Self-pay | Admitting: Neurology

## 2014-09-02 ENCOUNTER — Ambulatory Visit: Payer: PPO | Admitting: Nurse Practitioner

## 2014-09-02 NOTE — Telephone Encounter (Signed)
Spoke to [place] - she will here tomorrow for her appt.

## 2014-09-02 NOTE — Telephone Encounter (Signed)
Duplicate call.

## 2014-09-02 NOTE — Telephone Encounter (Signed)
Pt called back to speak with RN . Please call and advise [phone]

## 2014-09-02 NOTE — Telephone Encounter (Signed)
Left message for Katrina Rivera - need to confirm her new appt time on 09/03/14.

## 2014-09-03 ENCOUNTER — Encounter: Payer: Self-pay | Admitting: Neurology

## 2014-09-03 ENCOUNTER — Other Ambulatory Visit: Payer: Self-pay | Admitting: Sports Medicine

## 2014-09-03 ENCOUNTER — Ambulatory Visit (INDEPENDENT_AMBULATORY_CARE_PROVIDER_SITE_OTHER): Payer: PPO | Admitting: Neurology

## 2014-09-03 VITALS — BP 184/99 | HR 88 | Ht 61.0 in | Wt 124.0 lb

## 2014-09-03 DIAGNOSIS — G35 Multiple sclerosis: Secondary | ICD-10-CM | POA: Diagnosis not present

## 2014-09-03 DIAGNOSIS — M810 Age-related osteoporosis without current pathological fracture: Secondary | ICD-10-CM

## 2014-09-03 DIAGNOSIS — G894 Chronic pain syndrome: Secondary | ICD-10-CM

## 2014-09-03 DIAGNOSIS — R413 Other amnesia: Secondary | ICD-10-CM

## 2014-09-03 DIAGNOSIS — R269 Unspecified abnormalities of gait and mobility: Secondary | ICD-10-CM | POA: Diagnosis not present

## 2014-09-03 MED ORDER — FENTANYL 25 MCG/HR TD PT72
50.0000 ug | MEDICATED_PATCH | TRANSDERMAL | Status: DC
Start: 2014-09-03 — End: 2014-12-23

## 2014-09-03 MED ORDER — FENTANYL 50 MCG/HR TD PT72: 50.0000 ug | MEDICATED_PATCH | TRANSDERMAL | Status: DC

## 2014-09-03 NOTE — Progress Notes (Signed)
Chief Complaint  Patient presents with  . Multiple Sclerosis    Katrina Rivera reports after working in her yard for three days in the 90+degree weather and humidity, she started feeling more fatigued and off balance. She also noted the tremors that are normally present in her hands became a little worse.  After resting over the weekend, she continued to experience multiple concerning symptoms.  She made a list and brought it in for review today.      [facility]  PATIENT: Katrina Rivera DOB: 12-29-1945   REASON FOR VISIT: follow up for MS, abnormal gait, neuropathic pain, RLS  HISTORY OF PRESENT ILLNESS: Katrina Rivera is a very pleasant 69 year-old right-handed woman, with relapsing remitting multiple sclerosis, was treated with Betaseron 4 -5 years, but could not tolerate the side effect, now is not on any immunomodulation therapy. neuropathic pain of bilateral lower extremities.   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   She has baseline gait difficulty, she fell in May 2013, fractured both elbows and her left kneecap.  She intermittently has been using a cane or walker.  She could not tolerate Requip because of nausea. She has been on gabapentin 300 mg 3 bid. She gained significant weight when on Lyrica, and therefore was switched back to gabapentin. She has orthostatic hypotension, and is on midodrine 2.5 mg and fludrocortisone 0.1 mg 1 in AM and 2 at night, and she is also on 20 mg of hydrocortisone.   She  complains of bilateral lower extremity burning stinging sensation, getting worse when she sits still, difficulty falling into sleep, she has the urge to move because of bilateral extremity discomfort, has tried Requip, could not tolerate it because of GI side effects, Neuprol patch does not work either, she also tried Elavil, Cymbalta in the past, could not tolerate it due to side effect.   Jan 01/2014:  She came in urgently for acute worsening of her generalized condition since her lumbar decompression surgery in January 24 2013 by Dr. Marikay Alar, prior to surgery, she suffered left-sided low back pain, radiating pain to bilateral lower extremity, left is much worse than her right side, there was left L5-S1 extraforaminal herniated nucleus pulposus with left L5 radiculopathy she had left L5-S1 extraforaminal microdiscectomy utilizing microscopic dissection.  She had overnight stay at the hospital the day after surgery, she fell when trying to get up using bathroom, landed on her left side, surgery did help her lower back pain, radiating pain to her left leg, but she complained of worsening bilateral lower extremity deep achy pain, worsening gait difficulty, she has not used walker for 3 years, now she began to use her walker, she felt her skin is so tight at bilateral lower extremity, left leg swelling, also has difficulty swallowing, blurred vision, worsening fatigue, difficulty concentration, she has not had MRI evaluation for her multiple sclerosis for many years, is not on any immunomodulation therapy   UPDATE Mar 01, 2014:  She came in with a list of complains, much worse compared to presurgical level. She complains of blurred vision, difficulty  sleeping, difficulty concentrating, dizziness, worsening gait difficulty, The most bothersome symptoms are bilateral lower extremity pain, constant, knife cutting pain, left worse than right  Left leg showed no DVT on doppler study. She had 15 LB weight gain  over one month, more difficulty sleepy, body shaking, more difficulty sleepy.  She has tried fentanyl patch 50 mcg, Trileptal, Neurontin without helping her symptoms .She is going to be seen by her surgeon Dr. Yetta Barre in 3 days  We have reviewed MRI of the brain cervical lumbar spine together, these were done at [facility] in January 2015  MRI lumbar: L5-S1: disc bulging and facet hypertrophy, status post microdiscectomy on the left, with expected post-surgical changes. Multi-level facet hypertrophy, with no spinal stenosis or foraminal narrowing.  MRI cervical: Possible small chronic demyelinating plaque at C6 on the right side. No abnormal enhancing lesions  MRI brain: Multiple supratentorial and 1 infratentorial chronic demyelinating plaques. No acute plaques. Mild cerebellar tonsillar ectopia   We also reviewed, and compared to her previous MRI in 2010 from [facility], there was no significant change in the brain lesions,   UPDATE 08/22/13 Patient returns for follow up. Her biggest  complaint today is restless legs. She has never tried Neupro. She has had multiple trials of medications for her chronic pain. She is currently taking fentanyl, Trileptal, Cymbalta, lidocaine gel  UPDATE Feb 27 2014: She is not on any long-term immunomodulation therapy for her relapsing remitting multiple sclerosis, neurological deficit has been fairly stable, she has baseline gait difficulty, the most bothersome symptoms for her is low back pain, bilateral lower extremity deep achy pain, radiating pain from left lower back to her left leg,  She was recently found to have very low cortisol level, under endocrinologist Dr. Remus Blake care,  UPDATE July 23 2014:  She had lumbar decompression by Dr. Marikay Alar in Jun 18 2014, which did help her low back pain, but now she experienced worsening left lower extremity spasm, left calf spasm so hard, as if a knot was tied, difficulty walking, she was giving a steroid  package, no help,  She complained excessive weight gain with Lyrica, Neurontin did not help,  UPDATE July 27th 2016: She came in with a list of complaints, continue complains of unbalanced, staggering, generalized weakness, numbness tingling at bilateral lower extremity, worsening at her left leg, frequent urination, difficulty concentrating, difficulty with multitasking  REVIEW OF SYSTEMS: Full 14 system review of systems performed and notable only for those listed, all others are neg:  Fatigue, runny nose, trouble swallowing, drooling, blurred vision, heat intolerance, excessive thirst, swollen abdomen, abdominal pain, constipation, nausea, bowel incontinence, restless leg, insomnia, frequent awakening, daytime sleepiness, snoring, difficulty urinating, frequent urination, achy muscles, muscle cramps, walking difficulty, rash, bruise easily, memory loss, headaches, numbness, weakness, weakness, tremor. Depression anxiety, decreased concentration                                      ALLERGIES: No Known Allergies  HOME MEDICATIONS: Outpatient Prescriptions Prior to Visit  Medication Sig Dispense Refill  . bifidobacterium infantis (ALIGN) capsule Take 1 capsule by mouth daily.    . calcium-vitamin D (OSCAL WITH D) 500-200 MG-UNIT per tablet Take 1 tablet by mouth 2 (two) times daily.    . cetirizine (ZYRTEC) 10 MG tablet Take 10 mg by mouth daily.    . cyclobenzaprine (FLEXERIL) 10 MG tablet Take 1 tablet (10  mg total) by mouth 3 (three) times daily as needed for muscle spasms. 270 tablet 0  . docusate sodium (COLACE) 100 MG capsule Take 100 mg by mouth 2 (two) times daily.    . DULoxetine (CYMBALTA) 30 MG capsule Take 1 capsule (30 mg total) by mouth daily. 90 capsule 3  . fentaNYL (DURAGESIC - DOSED MCG/HR) 50 MCG/HR Place 50 mcg onto the skin every 3 (three) days.    . Ferrous Sulfate Dried 200 (65 FE) MG TABS Take 65 mg by mouth daily.    . fludrocortisone (FLORINEF) 0.1 MG tablet One  tablet in the morning and two tablets at night. (Patient taking differently: Take 0.1 mg by mouth 2 (two) times daily. ) 270 tablet 3  . fluticasone (FLONASE) 50 MCG/ACT nasal spray Place 2 sprays into both nostrils at bedtime as needed for allergies or rhinitis.    . methylPREDNISolone (MEDROL) 4 MG tablet Take 1/2 tablet twice a day 30 tablet 3  . Multiple Vitamin (MULITIVITAMIN WITH MINERALS) TABS Take 1 tablet by mouth daily.    . NONFORMULARY OR COMPOUNDED ITEM Meloxicam 0.5%, Gabapentin 6%, Lidocaine 2%, Prilocaine 2% Appt 1-2 grams to the affected area(s) 3-4 times daily    . omeprazole (PRILOSEC) 40 MG capsule Take 40 mg by mouth daily.     . ondansetron (ZOFRAN) 4 MG tablet Take 1 tablet every 4 hours as needed for nausea or vomiting. 50 tablet 1  . oxyCODONE-acetaminophen (PERCOCET/ROXICET) 5-325 MG per tablet Take 1-2 tablets by mouth every 4 (four) hours as needed for moderate pain. 90 tablet 0  . Polyethyl Glycol-Propyl Glycol (SYSTANE OP) Apply 2 drops to eye daily as needed (for dry eyes).    . potassium chloride (K-DUR,KLOR-CON) 10 MEQ tablet Take 2 in the morning and two at bedtime. 360 tablet 3  . tamsulosin (FLOMAX) 0.4 MG CAPS capsule Take 0.4 mg by mouth daily after breakfast.    . tiZANidine (ZANAFLEX) 2 MG tablet Take 1 tablet (2 mg total) by mouth 3 (three) times daily. 90 tablet 6  . traZODone (DESYREL) 150 MG tablet Take 150 mg by mouth at bedtime.    Marland Kitchen trimethoprim (TRIMPEX) 100 MG tablet Take 100 mg by mouth at bedtime.     Marland Kitchen UNABLE TO FIND Med Name: Compound Cream: meloxicam 0.5%, doxepin 3%, amantadine 3%, dextromethorphan 2%, lidocaine 2% ([employer] [phone])    . ALPRAZolam (XANAX) 1 MG tablet Take 1 mg by mouth at bedtime.     No facility-administered medications prior to visit.    PAST MEDICAL HISTORY: Past Medical History  Diagnosis Date  . Fibromyalgia   . Restless leg syndrome   . Multiple sclerosis     doesn't take any meds  .  Arthritis   . Osteoporosis   . Hypotension   . Headache(784.0)   . GERD (gastroesophageal reflux disease)   . Anxiety     takes Xanax nightly  . IBS (irritable bowel syndrome)     takes Librarian, academic daily  . Seasonal allergies     takes Zyrtec daily;uses Flonase daily as needed  . Hypotension     takes Florinef daily  . History of bronchitis     many yrs ago   . Dizziness     if b/p drops   . Peripheral neuropathy   . Weakness     numbness and tingling  . Joint pain   . Chronic back pain     stenosis  . Urinary frequency  takes Flomax daily  . Nocturia   . Anemia     takes Ferrous Sulfate daily  . History of blood transfusion     no abnormal reaction noted  . Addison's disease     takes Solu Cortef daily  . Hypokalemia     takes Potassium daily  . Depression     takes Cymbalta daily  . Insomnia     takes Trazodone nightly    PAST SURGICAL HISTORY: Past Surgical History  Procedure Laterality Date  . Cesarean section  1973/1977    x2  . Eye surgery      bilateral - /w IOL  . Lumbar laminectomy/decompression microdiscectomy Left 01/24/2013    Procedure: LUMBAR FIVE TO SACRAL ONE LUMBAR LAMINECTOMY/DECOMPRESSION MICRODISCECTOMY 1 LEVEL;  Surgeon: Tia Alert, MD;  Location: [facility];  Service: Neurosurgery;  Laterality: Left;  . Fracture surgery Right     rods and screws  . Cholecystectomy  1997  . Abdominal hysterectomy  1988  . Appendectomy  1988  . Colectomy  1990  . Colonoscopy    . Esophagogastroduodenoscopy    . Maximum access (mas)posterior lumbar interbody fusion (plif) 1 level N/A 06/18/2014    Procedure: MAXIMUM ACCESS SURGERY POSTERIOR LUMBAR INTERBODY FUSION LUMBAR FIVE TO SACRAL ONE ;  Surgeon: Tia Alert, MD;  Location: [facility];  Service: Neurosurgery;  Laterality: N/A;    FAMILY HISTORY: Family History  Problem Relation Age of Onset  . Hypertension Mother   . Stroke Mother   . Heart attack Father     SOCIAL HISTORY: History    Social History  . Marital Status: Married    Spouse Name: Gabriel Rung  . Number of Children: 2  . Years of Education: 12   Occupational History  .      Disabled   Social History Main Topics  . Smoking status: Never Smoker   . Smokeless tobacco: Never Used  . Alcohol Use: No  . Drug Use: No  . Sexual Activity: Not on file   Other Topics Concern  . Not on file   Social History Narrative   Pt lives at home with spouse. ([name])   Caffeine Use: 1 cups daily.   Right handed.   Disabled.   Education - high school   Patient has two adult children.     PHYSICAL EXAM  Filed Vitals:   09/03/14 1540  BP: 184/99  Pulse: 88  Height:  (1.549 m)  Weight: 124 lb (56.246 kg)   Body mass index is 23.44 kg/(m^2).  PHYSICAL EXAMNIATION:  Gen: NAD, conversant, well nourised, obese, well groomed                     Cardiovascular: Regular rate rhythm, no peripheral edema, warm, nontender. Eyes: Conjunctivae clear without exudates or hemorrhage Neck: Supple, no carotid bruise. Pulmonary: Clear to auscultation bilaterally   NEUROLOGICAL EXAM:  MENTAL STATUS: Speech:    Speech is normal; fluent and spontaneous with normal comprehension.  Cognition:    The patient is oriented to person, place, and time;     recent and remote memory intact;     language fluent;     normal attention, concentration,     fund of knowledge.  CRANIAL NERVES: CN II: Visual fields are full to confrontation. Pupils were equal round reactive to light. CN III, IV, VI: extraocular movement are normal. No ptosis. CN V: Facial sensation is intact to pinprick in all 3 divisions  bilaterally. Corneal responses are intact.  CN VII: Face is symmetric with normal eye closure and smile. CN VIII: Hearing is normal to rubbing fingers CN IX, X: Palate elevates symmetrically. Phonation is normal. CN XI: Head turning and shoulder shrug are intact CN XII: Tongue is midline with normal movements and no  atrophy.  MOTOR: There is no pronator drift of out-stretched arms. Muscle bulk and tone are normal. Muscle strength is normal.  REFLEXES: Reflexes are 2+ and symmetric at the biceps, triceps, knees, and ankles. Plantar responses are flexor.  SENSORY: Light touch, pinprick, position sense, and vibration sense are intact in fingers and toes.  COORDINATION: Rapid alternating movements and fine finger movements are intact. There is no dysmetria on finger-to-nose and heel-knee-shin. There are no abnormal or extraneous movements.   GAIT/STANCE: Wide based, mildly unsteady, could not perform tandem walking   DIAGNOSTIC DATA (LABS, IMAGING, TESTING) - I reviewed patient records, labs, notes, testing and imaging myself where available.  Lab Results  Component Value Date   WBC 11.7* 06/19/2014   HGB 10.2* 06/19/2014   HCT 30.4* 06/19/2014   MCV 91.6 06/19/2014   PLT 153 06/19/2014      Component Value Date/Time   NA 138 08/08/2014 0944   K 4.6 08/08/2014 0944   CL 101 08/08/2014 0944   CO2 31 08/08/2014 0944   GLUCOSE 86 08/08/2014 0944   BUN 15 08/08/2014 0944   CREATININE 0.96 08/08/2014 0944   CREATININE 0.85 11/06/2013 0840   CALCIUM 9.3 08/08/2014 0944   PROT 6.5 02/10/2014 0838   ALBUMIN 4.0 02/10/2014 0838   AST 17 02/10/2014 0838   ALT 14 02/10/2014 0838   ALKPHOS 37* 02/10/2014 0838   BILITOT 0.3 02/10/2014 0838   GFRNONAA 86* 01/21/2013 0956   GFRAA >90 01/21/2013 0956    Lab Results  Component Value Date   TSH 2.75 06/09/2014   ASSESSMENT AND PLAN  69 y.o. year old female   1.Relapsing remediating multiple sclerosis, not on long term immunomodulation therapy 2. Lumbar radiculopathy, left leg pain refill her fentanyl patch 25 g every 3 days,  3. Her complains of decreased concentration, mild memory trouble, likely due to combination of lack of sleep, insomnia, polypharmacy, MS, check laboratory evaluation, TSH. 12 today, I also went over her medication  list, suggested stop zyrtec, Flexeril as needed.    Levert Feinstein, M.D. Ph.D.  [facility] [address] [place], [place] [address] Phone: [phone] Fax:      [phone]

## 2014-09-08 ENCOUNTER — Telehealth: Payer: Self-pay | Admitting: Nurse Practitioner

## 2014-09-08 LAB — VITAMIN D 1,25 DIHYDROXY
Vitamin D 1, 25 (OH)2 Total: 51 pg/mL
Vitamin D2 1, 25 (OH)2: <10 pg/mL
Vitamin D3 1, 25 (OH)2: 51 pg/mL

## 2014-09-08 LAB — VITAMIN B12: VITAMIN B 12: 993 pg/mL — AB (ref 211–946)

## 2014-09-08 NOTE — Telephone Encounter (Signed)
Please call patient for normal result, I have released the lab result to my chart

## 2014-09-08 NOTE — Telephone Encounter (Signed)
Patient is calling to get the results of her blood work. Please call.

## 2014-09-08 NOTE — Telephone Encounter (Signed)
Spoke to [place] - aware of results.

## 2014-09-10 ENCOUNTER — Ambulatory Visit
Admission: RE | Admit: 2014-09-10 | Discharge: 2014-09-10 | Disposition: A | Discharge status: Home / Self Care | Payer: PPO | Source: Ambulatory Visit | Attending: Sports Medicine | Admitting: Sports Medicine

## 2014-09-10 DIAGNOSIS — M810 Age-related osteoporosis without current pathological fracture: Secondary | ICD-10-CM

## 2014-09-11 ENCOUNTER — Telehealth: Payer: Self-pay

## 2014-09-11 NOTE — Telephone Encounter (Signed)
Ins will not authorize coverage for two Fentanyl patches every 3 days.  Questioning if a new Rx can be written for one every three days instead of two every three days.  Please advise.  Thank you.

## 2014-09-11 NOTE — Telephone Encounter (Signed)
The decision was made to decrease her fentanyl patch from 50 g to 25 g every 3 days, please double check with patient the dosage of the medication she is currently using,  We also initiate pain management referral twice, please check on the status of her pain management visit.

## 2014-09-11 NOTE — Telephone Encounter (Signed)
I called the patient.  We spoke at great length.  Says she will be going down to one patch every 72 hours.  She found an additional box of patches at home, so she is finishing those first, then will start patches.  Says the patch in combination with compound cream seems to be effective for her at this time, so she does not wish to pursue pain clinic.   I called the pharmacy back and spoke with Lawson Fiscal.  She verified they can change the directions to one patch every 72 hours without a new Rx being written.

## 2014-09-18 ENCOUNTER — Telehealth: Payer: Self-pay | Admitting: Neurology

## 2014-09-18 NOTE — Telephone Encounter (Signed)
Patient is itching all over, no rash, "itching on the inside/out", wakes her up when she is sleeping.

## 2014-09-18 NOTE — Telephone Encounter (Signed)
I have spoken with Katrina Rivera this afternoon.  She c/o generalized itching feeling onset yrs. ago.  Sts. Dr. Sandria Manly has eval. her for this in the past and always told her it was due to fibromyalgia.  Dr. Terrace Arabia has never seen her for this problem. She denies rash or other sx., just sts. gen. itching, doesn't like it when certain things touch her skin, like the tags on her shirts.   Appt. given with Dr. Terrace Arabia for 10-22-14, and she will call back if condition worsens/fim

## 2014-10-07 ENCOUNTER — Telehealth: Payer: Self-pay | Admitting: Endocrinology

## 2014-10-07 NOTE — Telephone Encounter (Signed)
Patient said she would rather just stop it until she sees you again, she's tired of being nauseous all the time

## 2014-10-07 NOTE — Telephone Encounter (Signed)
Pt states since the start of the new med Methylpredaisolone is making her severe nausea and she cannot eat so she is losing weight, nausea pills not working no vomiting at this point

## 2014-10-07 NOTE — Telephone Encounter (Signed)
Please see below and advise.

## 2014-10-07 NOTE — Telephone Encounter (Signed)
She can take only half a tablet at dinnertime for now after eating

## 2014-10-08 ENCOUNTER — Encounter (HOSPITAL_COMMUNITY): Payer: Self-pay | Admitting: *Deleted

## 2014-10-08 ENCOUNTER — Emergency Department (HOSPITAL_COMMUNITY)
Admission: EM | Admit: 2014-10-08 | Discharge: 2014-10-09 | Disposition: A | Discharge status: Home / Self Care | Payer: PPO | Attending: Emergency Medicine | Admitting: Emergency Medicine

## 2014-10-08 DIAGNOSIS — G8929 Other chronic pain: Secondary | ICD-10-CM | POA: Insufficient documentation

## 2014-10-08 DIAGNOSIS — K589 Irritable bowel syndrome without diarrhea: Secondary | ICD-10-CM | POA: Insufficient documentation

## 2014-10-08 DIAGNOSIS — F329 Major depressive disorder, single episode, unspecified: Secondary | ICD-10-CM | POA: Insufficient documentation

## 2014-10-08 DIAGNOSIS — Z7952 Long term (current) use of systemic steroids: Secondary | ICD-10-CM | POA: Diagnosis not present

## 2014-10-08 DIAGNOSIS — M810 Age-related osteoporosis without current pathological fracture: Secondary | ICD-10-CM | POA: Insufficient documentation

## 2014-10-08 DIAGNOSIS — Z79899 Other long term (current) drug therapy: Secondary | ICD-10-CM | POA: Insufficient documentation

## 2014-10-08 DIAGNOSIS — W19XXXA Unspecified fall, initial encounter: Secondary | ICD-10-CM | POA: Diagnosis not present

## 2014-10-08 DIAGNOSIS — Y9301 Activity, walking, marching and hiking: Secondary | ICD-10-CM | POA: Insufficient documentation

## 2014-10-08 DIAGNOSIS — R630 Anorexia: Secondary | ICD-10-CM | POA: Diagnosis not present

## 2014-10-08 DIAGNOSIS — D649 Anemia, unspecified: Secondary | ICD-10-CM | POA: Insufficient documentation

## 2014-10-08 DIAGNOSIS — M1990 Unspecified osteoarthritis, unspecified site: Secondary | ICD-10-CM | POA: Diagnosis not present

## 2014-10-08 DIAGNOSIS — G4700 Insomnia, unspecified: Secondary | ICD-10-CM | POA: Diagnosis not present

## 2014-10-08 DIAGNOSIS — S8992XA Unspecified injury of left lower leg, initial encounter: Secondary | ICD-10-CM | POA: Insufficient documentation

## 2014-10-08 DIAGNOSIS — Y999 Unspecified external cause status: Secondary | ICD-10-CM | POA: Diagnosis not present

## 2014-10-08 DIAGNOSIS — F419 Anxiety disorder, unspecified: Secondary | ICD-10-CM | POA: Diagnosis not present

## 2014-10-08 DIAGNOSIS — Y929 Unspecified place or not applicable: Secondary | ICD-10-CM | POA: Insufficient documentation

## 2014-10-08 DIAGNOSIS — E876 Hypokalemia: Secondary | ICD-10-CM | POA: Diagnosis not present

## 2014-10-08 DIAGNOSIS — E271 Primary adrenocortical insufficiency: Secondary | ICD-10-CM | POA: Insufficient documentation

## 2014-10-08 DIAGNOSIS — Z79891 Long term (current) use of opiate analgesic: Secondary | ICD-10-CM | POA: Diagnosis not present

## 2014-10-08 DIAGNOSIS — R110 Nausea: Secondary | ICD-10-CM | POA: Diagnosis not present

## 2014-10-08 DIAGNOSIS — K219 Gastro-esophageal reflux disease without esophagitis: Secondary | ICD-10-CM | POA: Insufficient documentation

## 2014-10-08 DIAGNOSIS — R55 Syncope and collapse: Secondary | ICD-10-CM | POA: Diagnosis present

## 2014-10-08 LAB — CBG MONITORING, ED: GLUCOSE-CAPILLARY: 84 mg/dL (ref 65–99)

## 2014-10-08 NOTE — ED Provider Notes (Signed)
CSN: [id number]     Arrival date & time 10/08/14  2341 History  This chart was scribed for Marisa Severin, MD by Doreatha Martin, ED Scribe. This patient was seen in room D30C/D30C and the patient's care was started at 12:15 PM.     Chief Complaint  Patient presents with  . Loss of Consciousness   The history is provided by the patient. No language interpreter was used.    HPI Comments: Katrina Rivera is a 69 y.o. female with Hx of fibromyalgia, MS, hypotension, GERD, IBS, hypokalemia, Addison's disease who presents to the Emergency Department complaining of a moderate syncopal episode onset tonight. Pt states she was walking to the bathroom after watching television and woke up on the floor on top of her left leg, but does not remember how she landed. She notes that she went limp again after her husband tried to help her up. Hx of similar symptoms secondary to hypotension. She notes associated nausea onset 1 week ago after switching to an oral steroid, mild left leg pain, decreased appetite secondary to nausea. She states she has been drinking fluids regularly. She notes that the only thing she ate today was ice cream. She states that her last dose of the steroid was last night. Pt notes that she has taken steroids for a year. She was receiving steroid shots until 4 months ago. Pt also takes Medrol. No Hx of HTN, but she notes that as of late, her systolic BP has been in the 150s. She denies head injury, HA, neck pain, left hip pain.   Past Medical History  Diagnosis Date  . Fibromyalgia   . Restless leg syndrome   . Multiple sclerosis     doesn't take any meds  . Arthritis   . Osteoporosis   . Hypotension   . Headache(784.0)   . GERD (gastroesophageal reflux disease)   . Anxiety     takes Xanax nightly  . IBS (irritable bowel syndrome)     takes Librarian, academic daily  . Seasonal allergies     takes Zyrtec daily;uses Flonase daily as needed  . Hypotension     takes Florinef daily  . History of  bronchitis     many yrs ago   . Dizziness     if b/p drops   . Peripheral neuropathy   . Weakness     numbness and tingling  . Joint pain   . Chronic back pain     stenosis  . Urinary frequency     takes Flomax daily  . Nocturia   . Anemia     takes Ferrous Sulfate daily  . History of blood transfusion     no abnormal reaction noted  . Addison's disease     takes Solu Cortef daily  . Hypokalemia     takes Potassium daily  . Depression     takes Cymbalta daily  . Insomnia     takes Trazodone nightly   Past Surgical History  Procedure Laterality Date  . Cesarean section  1973/1977    x2  . Eye surgery      bilateral - /w IOL  . Lumbar laminectomy/decompression microdiscectomy Left 01/24/2013    Procedure: LUMBAR FIVE TO SACRAL ONE LUMBAR LAMINECTOMY/DECOMPRESSION MICRODISCECTOMY 1 LEVEL;  Surgeon: Tia Alert, MD;  Location: [facility];  Service: Neurosurgery;  Laterality: Left;  . Fracture surgery Right     rods and screws  . Cholecystectomy  1997  . Abdominal hysterectomy  1988  . Appendectomy  1988  . Colectomy  1990  . Colonoscopy    . Esophagogastroduodenoscopy    . Maximum access (mas)posterior lumbar interbody fusion (plif) 1 level N/A 06/18/2014    Procedure: MAXIMUM ACCESS SURGERY POSTERIOR LUMBAR INTERBODY FUSION LUMBAR FIVE TO SACRAL ONE ;  Surgeon: Tia Alert, MD;  Location: [facility];  Service: Neurosurgery;  Laterality: N/A;   Family History  Problem Relation Age of Onset  . Hypertension Mother   . Stroke Mother   . Heart attack Father    Social History  Substance Use Topics  . Smoking status: Never Smoker   . Smokeless tobacco: Never Used  . Alcohol Use: No   OB History    No data available     Review of Systems  Constitutional: Positive for appetite change.  Gastrointestinal: Positive for nausea.  Musculoskeletal: Positive for arthralgias. Negative for neck pain.  Neurological: Positive for syncope. Negative for headaches.  All  other systems reviewed and are negative.  Allergies  Review of patient's allergies indicates no known allergies.  Home Medications   Prior to Admission medications   Medication Sig Start Date End Date Taking? Authorizing Provider  bifidobacterium infantis (ALIGN) capsule Take 1 capsule by mouth daily.    Historical Provider, MD  calcium-vitamin D (OSCAL WITH D) 500-200 MG-UNIT per tablet Take 1 tablet by mouth 2 (two) times daily.    Historical Provider, MD  cyclobenzaprine (FLEXERIL) 10 MG tablet Take 1 tablet (10 mg total) by mouth 3 (three) times daily as needed for muscle spasms. 07/28/14   Levert Feinstein, MD  docusate sodium (COLACE) 100 MG capsule Take 100 mg by mouth 2 (two) times daily.    Historical Provider, MD  DULoxetine (CYMBALTA) 30 MG capsule Take 1 capsule (30 mg total) by mouth daily. 02/27/14   Levert Feinstein, MD  fentaNYL (DURAGESIC - DOSED MCG/HR) 25 MCG/HR patch Place 2 patches (50 mcg total) onto the skin every 3 (three) days. 09/03/14   Levert Feinstein, MD  Ferrous Sulfate Dried 200 (65 FE) MG TABS Take 65 mg by mouth daily.    Historical Provider, MD  fludrocortisone (FLORINEF) 0.1 MG tablet One tablet in the morning and two tablets at night. Patient taking differently: Take 0.1 mg by mouth 2 (two) times daily.  02/13/14   Reather Littler, MD  fluticasone (FLONASE) 50 MCG/ACT nasal spray Place 2 sprays into both nostrils at bedtime as needed for allergies or rhinitis.    Historical Provider, MD  methylPREDNISolone (MEDROL) 4 MG tablet Take 1/2 tablet twice a day 08/13/14   Reather Littler, MD  Multiple Vitamin (MULITIVITAMIN WITH MINERALS) TABS Take 1 tablet by mouth daily.    Historical Provider, MD  NONFORMULARY OR COMPOUNDED ITEM Meloxicam 0.5%, Gabapentin 6%, Lidocaine 2%, Prilocaine 2% Appt 1-2 grams to the affected area(s) 3-4 times daily    Historical Provider, MD  omeprazole (PRILOSEC) 40 MG capsule Take 40 mg by mouth daily.  11/01/13   Historical Provider, MD  ondansetron (ZOFRAN) 4 MG tablet  Take 1 tablet every 4 hours as needed for nausea or vomiting. 04/30/14   Reather Littler, MD  oxyCODONE-acetaminophen (PERCOCET/ROXICET) 5-325 MG per tablet Take 1-2 tablets by mouth every 4 (four) hours as needed for moderate pain. 06/19/14   Tia Alert, MD  Polyethyl Glycol-Propyl Glycol (SYSTANE OP) Apply 2 drops to eye daily as needed (for dry eyes).    Historical Provider, MD  potassium chloride (K-DUR,KLOR-CON) 10 MEQ tablet Take 2  in the morning and two at bedtime. 08/13/14   Reather Littler, MD  tamsulosin (FLOMAX) 0.4 MG CAPS capsule Take 0.4 mg by mouth daily after breakfast.    Historical Provider, MD  tiZANidine (ZANAFLEX) 2 MG tablet Take 1 tablet (2 mg total) by mouth 3 (three) times daily. 07/23/14   Levert Feinstein, MD  traZODone (DESYREL) 150 MG tablet Take 150 mg by mouth at bedtime.    Historical Provider, MD  trimethoprim (TRIMPEX) 100 MG tablet Take 100 mg by mouth at bedtime.  07/07/13   Historical Provider, MD  UNABLE TO FIND Med Name: Compound Cream: meloxicam 0.5%, doxepin 3%, amantadine 3%, dextromethorphan 2%, lidocaine 2% ([employer] [id number])    Historical Provider, MD   BP 202/57 mmHg  Pulse 68  Temp(Src) 98.5 F (36.9 C) (Oral)  Resp 18  SpO2 99% Physical Exam  Constitutional: She is oriented to person, place, and time. She appears well-developed and well-nourished. No distress.  HENT:  Head: Normocephalic and atraumatic.  Right Ear: External ear normal.  Left Ear: External ear normal.  Nose: Nose normal.  Mouth/Throat: Oropharynx is clear and moist.  Eyes: Conjunctivae and EOM are normal. Pupils are equal, round, and reactive to light.  Neck: Normal range of motion. Neck supple. No JVD present. No tracheal deviation present. No thyromegaly present.  Cardiovascular: Normal rate, regular rhythm, normal heart sounds and intact distal pulses.  Exam reveals no gallop and no friction rub.   No murmur heard. Pulmonary/Chest: Effort normal and breath sounds  normal. No stridor. No respiratory distress. She has no wheezes. She has no rales. She exhibits no tenderness.  Abdominal: Soft. Bowel sounds are normal. She exhibits no distension and no mass. There is no tenderness. There is no rebound and no guarding.  Musculoskeletal: Normal range of motion. She exhibits no edema or tenderness.  Lymphadenopathy:    She has no cervical adenopathy.  Neurological: She is alert and oriented to person, place, and time. She displays normal reflexes. No cranial nerve deficit. She exhibits normal muscle tone. Coordination normal.  Skin: Skin is warm and dry. No rash noted. No erythema. No pallor.  Psychiatric: She has a normal mood and affect. Her behavior is normal. Judgment and thought content normal.  Nursing note and vitals reviewed.   ED Course  Procedures (including critical care time) DIAGNOSTIC STUDIES: Oxygen Saturation is 98% on RA, normal by my interpretation.    COORDINATION OF CARE: 12:19 AM Discussed treatment plan with pt at bedside and pt agreed to plan.   Labs Review Labs Reviewed  BASIC METABOLIC PANEL - Abnormal; Notable for the following:    Chloride 100 (*)    Glucose, Bld 109 (*)    All other components within normal limits  MAGNESIUM  CBC  CBG MONITORING, ED    Imaging Review No results found. I have personally reviewed and evaluated these images and lab results as part of my medical decision-making.   EKG Interpretation   Date/Time:  Thursday October 09 2014 00:04:52 EDT Ventricular Rate:  59 PR Interval:  170 QRS Duration: 85 QT Interval:  394 QTC Calculation: 390 R Axis:   14 Text Interpretation:  Sinus rhythm Consider right atrial enlargement  Confirmed by Prisca Gearing  MD, Narayan Scull ([id number]) on 10/09/2014 12:22:59 AM      MDM   Final diagnoses:  Syncope, unspecified syncope type  Steroid dependent for adrenal supression    I personally performed the services described in this documentation, which was scribed in my  presence. The recorded information has been reviewed and is accurate.  69 year old female with syncope.  Has history of same, has Addison's per her history.  Patient has not been able to tolerate her steroids for the last several days.  Patient also has not eaten in the last several days.  No head injury, no head or neck pain.  Plan for labs, hydrocortisone injection.  Patient has close follow-up with her endocrinologist planned  Marisa Severin, MD 10/09/14 [phone]

## 2014-10-08 NOTE — ED Notes (Addendum)
Pt arrives via EMS from home. States that she was walking to the bathroom and "felt funny" and suffered a syncopal event. Awoke on the floor of the bathroom. Pt awoke and her husband was trying to help her to the bed when she "went limp in his arms."  Denies hitting her head, denies neck or head pain. Has hx of MS and orthostatic hypotension (negative for EMS).pt also states that her cortisol levels were low and started steroids over a year ago, did not take a dose yesterday because she was nauseous.[facility] VS: 170/83 HR 71 98% RA, CBG 118.

## 2014-10-09 ENCOUNTER — Other Ambulatory Visit: Payer: PPO

## 2014-10-09 LAB — CBC
HCT: 37.9 % (ref 36.0–46.0)
Hemoglobin: 12.3 g/dL (ref 12.0–15.0)
MCH: 29.6 pg (ref 26.0–34.0)
MCHC: 32.5 g/dL (ref 30.0–36.0)
MCV: 91.1 fL (ref 78.0–100.0)
PLATELETS: 208 10*3/uL (ref 150–400)
RBC: 4.16 MIL/uL (ref 3.87–5.11)
RDW: 12.8 % (ref 11.5–15.5)
WBC: 8.8 10*3/uL (ref 4.0–10.5)

## 2014-10-09 LAB — BASIC METABOLIC PANEL
Anion gap: 7 (ref 5–15)
BUN: 11 mg/dL (ref 6–20)
CALCIUM: 9.4 mg/dL (ref 8.9–10.3)
CO2: 29 mmol/L (ref 22–32)
CREATININE: 0.84 mg/dL (ref 0.44–1.00)
Chloride: 100 mmol/L — ABNORMAL LOW (ref 101–111)
GFR calc non Af Amer: >60 mL/min (ref >60)
GLUCOSE: 109 mg/dL — AB (ref 65–99)
Potassium: 4.6 mmol/L (ref 3.5–5.1)
Sodium: 136 mmol/L (ref 135–145)

## 2014-10-09 LAB — MAGNESIUM: Magnesium: 2 mg/dL (ref 1.7–2.4)

## 2014-10-09 MED ORDER — METOCLOPRAMIDE HCL 10 MG PO TABS: 10.0000 mg | ORAL_TABLET | Freq: Four times a day (QID) | ORAL | Status: DC

## 2014-10-09 MED ORDER — HYDROCORTISONE NA SUCCINATE PF 100 MG IJ SOLR
100.0000 mg | Freq: Once | INTRAMUSCULAR | Status: AC
  Administered 2014-10-09: 100 mg via INTRAVENOUS
  Filled 2014-10-09: qty 2

## 2014-10-09 NOTE — Discharge Instructions (Signed)
Syncope °Syncope is a medical term for fainting or passing out. This means you lose consciousness and drop to the ground. People are generally unconscious for less than 5 minutes. You may have some muscle twitches for up to 15 seconds before waking up and returning to normal. Syncope occurs more often in older adults, but it can happen to anyone. While most causes of syncope are not dangerous, syncope can be a sign of a serious medical problem. It is important to seek medical care.  °CAUSES  °Syncope is caused by a sudden drop in blood flow to the brain. The specific cause is often not determined. Factors that can bring on syncope include: °· Taking medicines that lower blood pressure. °· Sudden changes in posture, such as standing up quickly. °· Taking more medicine than prescribed. °· Standing in one place for too long. °· Seizure disorders. °· Dehydration and excessive exposure to heat. °· Low blood sugar (hypoglycemia). °· Straining to have a bowel movement. °· Heart disease, irregular heartbeat, or other circulatory problems. °· Fear, emotional distress, seeing blood, or severe pain. °SYMPTOMS  °Right before fainting, you may: °· Feel dizzy or light-headed. °· Feel nauseous. °· See all white or all black in your field of vision. °· Have cold, clammy skin. °DIAGNOSIS  °Your health care provider will ask about your symptoms, perform a physical exam, and perform an electrocardiogram (ECG) to record the electrical activity of your heart. Your health care provider may also perform other heart or blood tests to determine the cause of your syncope which may include: °· Transthoracic echocardiogram (TTE). During echocardiography, sound waves are used to evaluate how blood flows through your heart. °· Transesophageal echocardiogram (TEE). °· Cardiac monitoring. This allows your health care provider to monitor your heart rate and rhythm in real time. °· Holter monitor. This is a portable device that records your  heartbeat and can help diagnose heart arrhythmias. It allows your health care provider to track your heart activity for several days, if needed. °· Stress tests by exercise or by giving medicine that makes the heart beat faster. °TREATMENT  °In most cases, no treatment is needed. Depending on the cause of your syncope, your health care provider may recommend changing or stopping some of your medicines. °HOME CARE INSTRUCTIONS °· Have someone stay with you until you feel stable. °· Do not drive, use machinery, or play sports until your health care provider says it is okay. °· Keep all follow-up appointments as directed by your health care provider. °· Lie down right away if you start feeling like you might faint. Breathe deeply and steadily. Wait until all the symptoms have passed. °· Drink enough fluids to keep your urine clear or pale yellow. °· If you are taking blood pressure or heart medicine, get up slowly and take several minutes to sit and then stand. This can reduce dizziness. °SEEK IMMEDIATE MEDICAL CARE IF:  °· You have a severe headache. °· You have unusual pain in the chest, abdomen, or back. °· You are bleeding from your mouth or rectum, or you have black or tarry stool. °· You have an irregular or very fast heartbeat. °· You have pain with breathing. °· You have repeated fainting or seizure-like jerking during an episode. °· You faint when sitting or lying down. °· You have confusion. °· You have trouble walking. °· You have severe weakness. °· You have vision problems. °If you fainted, call your local emergency services (911 in [place] Do not drive   yourself to the hospital.  °MAKE SURE YOU: °· Understand these instructions. °· Will watch your condition. °· Will get help right away if you are not doing well or get worse. °Document Released: 01/24/2005 Document Revised: 01/29/2013 Document Reviewed: 03/25/2011 °[employer]® Patient Information ©2015 [employer]. This information is not intended to replace  advice given to you by your health care provider. Make sure you discuss any questions you have with your health care provider. ° °

## 2014-10-14 ENCOUNTER — Ambulatory Visit: Payer: PPO | Admitting: Endocrinology

## 2014-10-15 ENCOUNTER — Ambulatory Visit (INDEPENDENT_AMBULATORY_CARE_PROVIDER_SITE_OTHER): Payer: PPO | Admitting: Endocrinology

## 2014-10-15 ENCOUNTER — Encounter: Payer: Self-pay | Admitting: Endocrinology

## 2014-10-15 VITALS — BP 80/58 | HR 70 | Temp 98.2°F | Resp 14 | Ht 61.0 in | Wt 116.8 lb

## 2014-10-15 DIAGNOSIS — I951 Orthostatic hypotension: Secondary | ICD-10-CM

## 2014-10-15 DIAGNOSIS — E2740 Unspecified adrenocortical insufficiency: Secondary | ICD-10-CM

## 2014-10-15 DIAGNOSIS — G903 Multi-system degeneration of the autonomic nervous system: Secondary | ICD-10-CM

## 2014-10-15 MED ORDER — PROMETHAZINE HCL 12.5 MG PO TABS: 12.5000 mg | ORAL_TABLET | Freq: Three times a day (TID) | ORAL | Status: DC | PRN

## 2014-10-15 NOTE — Progress Notes (Signed)
Patient ID: Katrina Rivera, female   DOB: Mar 02, 1945, 69 y.o.   MRN: [id number]   Subjective:     Chief complaint: Follow-up of adrenal insufficiency   PAST history: She has had long-standing problems with orthostatic hypotension and also hyponatremia. Has been diagnosed with dysautonomia and has multiple other problems related to autonomic neuropathy.   She has been on Florinef since 2004 previously taking 3 tablets daily along with 5 tablets of potassium which had previously controlled her symptoms well . Because of persistent orthostatic symptoms she had been tried on midodrine on 05/28/12 but this was later stopped when blood pressure increased.  She  had medication adjustments done frequently over the last year for regulating her blood pressure.  RECENT history:  AUTONOMIC dysfunction:  She was previously on Florinef bid 1 tablet a day since about 07/23/14.    On her own she has adjusted her Florinef again.  She thinks her blood pressure was running high and she is now taking only 1 tablet in the morning and half in the evening.  However checking blood pressure with a wrist monitor and not clear if this is accurate Recently blood pressure readings have been fluctuating but fairly good the last couple of days him a checking readings only sitting down  She is also still taking 4 tablets of potassium and the level is now high normal   Lab Results  Component Value Date   CREATININE 0.84 10/09/2014   BUN 11 10/09/2014   NA 136 10/09/2014   K 4.6 10/09/2014   CL 100* 10/09/2014   CO2 29 10/09/2014     Adrenal Insufficiency:   This is secondary to pituitary dysfunction  Prior testing included Cortrosyn test baseline with stimulated level of 15.7, baseline 3.9.  Also confirmed by 24 hour urine free cortisol which was only 3.  Although previously she had tolerated oral hydrocortisone and small doses with improvement in her symptoms she did not continue this long-term She was again  symptomatic with weight loss, decreased appetite, nausea and also diarrhea Cortrosyn stimulation test on 03/12/14 showed baseline cortisol level of 0.3 and post injection of 1.1 only  She had GI side effects from hydrocortisone and prednisone and was given hydrocortisone injections using insulin syringe  After her back surgery she was off her hydrocortisone injections and was initially tolerating methylprednisolone twice a day.  Even with taking 2 mg twice a day more recently she says she has started having nausea and decreased appetite Because of nausea she wanted to stop the medication altogether but on the day she did not take it in the morning she had an episode of syncope in the evening and was treated in the ER; however blood pressure at the ER was high  She is back on taking this twice a day and still complains of significant nausea and decreased appetite, has lost weight Sodium is normal recently  Wt Readings from Last 3 Encounters:  10/15/14 116 lb 12.8 oz (52.98 kg)  09/03/14 124 lb (56.246 kg)  08/13/14 129 lb (58.514 kg)    General Endocrinology:    Pituitary: She has had a low normal free T4  but did not tolerate thyroxine supplementation in the past which were tried because of her continued symptoms of fatigue Has not had any abnormalities of the pituitary on previous MRI of the brain  Lab Results  Component Value Date   TSH 2.75 06/09/2014   TSH 1.60 02/10/2014   TSH 0.65 08/05/2013  FREET4 0.64 06/09/2014   FREET4 0.66 02/10/2014   FREET4 0.78* 11/06/2013            Medication List       This list is accurate as of: 10/15/14  9:19 AM.  Always use your most recent med list.               bifidobacterium infantis capsule  Take 1 capsule by mouth daily.     calcium-vitamin D 500-200 MG-UNIT per tablet  Commonly known as:  OSCAL WITH D  Take 1 tablet by mouth daily with breakfast.     cyclobenzaprine 10 MG tablet  Commonly known as:  FLEXERIL  Take 1  tablet (10 mg total) by mouth 3 (three) times daily as needed for muscle spasms.     docusate sodium 100 MG capsule  Commonly known as:  COLACE  Take 100 mg by mouth daily.     DULoxetine 30 MG capsule  Commonly known as:  CYMBALTA  Take 1 capsule (30 mg total) by mouth daily.     fentaNYL 25 MCG/HR patch  Commonly known as:  DURAGESIC - dosed mcg/hr  Place 2 patches (50 mcg total) onto the skin every 3 (three) days.     Ferrous Sulfate Dried 200 (65 FE) MG Tabs  Take 65 mg by mouth daily.     fludrocortisone 0.1 MG tablet  Commonly known as:  FLORINEF  One tablet in the morning and two tablets at night.     fluticasone 50 MCG/ACT nasal spray  Commonly known as:  FLONASE  Place 2 sprays into both nostrils at bedtime as needed for allergies or rhinitis.     methylPREDNISolone 4 MG tablet  Commonly known as:  MEDROL  Take 1/2 tablet twice a day     metoCLOPramide 10 MG tablet  Commonly known as:  REGLAN  Take 1 tablet (10 mg total) by mouth every 6 (six) hours. Prn nausea     multivitamin with minerals Tabs tablet  Take 1 tablet by mouth daily.     NONFORMULARY OR COMPOUNDED ITEM  Meloxicam 0.5%, Gabapentin 6%, Lidocaine 2%, Prilocaine 2% Appt 1-2 grams to the affected area(s) 3-4 times daily     ondansetron 4 MG tablet  Commonly known as:  ZOFRAN  Take 1 tablet every 4 hours as needed for nausea or vomiting.     oxyCODONE-acetaminophen 5-325 MG per tablet  Commonly known as:  PERCOCET/ROXICET  Take 1-2 tablets by mouth every 4 (four) hours as needed for moderate pain.     potassium chloride 10 MEQ tablet  Commonly known as:  K-DUR,KLOR-CON  Take 2 in the morning and two at bedtime.     SYSTANE OP  Apply 2 drops to eye daily as needed (for dry eyes).     tamsulosin 0.4 MG Caps capsule  Commonly known as:  FLOMAX  Take 0.4 mg by mouth daily after breakfast.     tiZANidine 2 MG tablet  Commonly known as:  ZANAFLEX  Take 1 tablet (2 mg total) by mouth 3 (three)  times daily.     traZODone 150 MG tablet  Commonly known as:  DESYREL  Take 150 mg by mouth at bedtime.     trimethoprim 100 MG tablet  Commonly known as:  TRIMPEX  Take 100 mg by mouth at bedtime.     UNABLE TO FIND  Med Name: Compound Cream: meloxicam 0.5%, doxepin 3%, amantadine 3%, dextromethorphan 2%, lidocaine 2% ([employer] [phone])  Review of Systems   Current information is a copy of the previous note:  Increased sweating: Has had hot flashes and periodic sweating in the daytime  Did not tolerate propantheline. Had been slightly better with Climara 0.1 mg and subsequently  taking Vivelle which she stopped because of cost. Effexor was also tried but she did not tolerate this and also sweating was no better She was also tried on clonidine patches without much relief She was retried on hormone patches but she stopped this because he felt it was causing drowsiness She does not complain of excessive symptoms now  Genitourinary: Positive for difficulty urinating.       She was taking urecholine for her  neurogenic bladder  but stopped this because of excessive salivation Neurological: Positive for numbness.       Has peripheral neuropathy, symptomatic of unclear etiology and followed by neurologist  She has had significant amount of neuropathic pain in her feet and legs and was not relieved with large doses of gabapentin.  She is on Cymbalta and Trileptal and also local lidocaine     She can have occasional diarrhea, followed by gastroenterologist    Objective:   Physical Exam  BP 80/58 mmHg  Pulse 70  Temp(Src) 98.2 F (36.8 C)  Resp 14  Ht 5\' 1"  (1.549 m)  Wt 116 lb 12.8 oz (52.98 kg)  BMI 22.08 kg/m2  SpO2 94%  Repeat blood pressure standing is 118/66 on the right and 120/68 on the left No supraclavicular fat pads or facial changes to suggest Cushing's syndrome  No pedal edema  Assessment:      Adrenal insufficiency: She has  secondary adrenal insufficiency as diagnosed by her symptoms and very low cortisol levels on the stimulation test  She previously had been able take methylprednisolone  orally without GI side effects She has however started to have nausea and decreased appetite again with this and does not think she has any other problems going on causing nausea She is also not getting relief of nausea from trial of Zofran and also Reglan given in the ER  Discussed that we can get her back on injectable hydrocortisone and for now will have her take 10 mg twice a day; this will be adjusted as needed.  Previously may have had some tendency to weight gain with 30 mg daily Her husband can help her do this and he can now 20 units on the insulin syringe, discussed timing of injections She will call if not improved  For her nausea she can try taking Phenergan 12.5 mg, if not better will need to have a follow-up with her gastroenterologist.    Orthostatic dysautonomic hypotension syndrome:   Her blood pressure appears well controlled with 1 Florinef tablet in the morning and half in the evening Advised her to check her blood pressure standing up also at home She will need to compare her home wrist monitor to the office machine  Potassium has been high normal at  4.6   Plan:      She can reduce potassium to 3 tablets Other medications as above    Taimur Fier  Total visit time including review of multiple problems, labs, hospital records, instructions on education = 25 minutes

## 2014-10-15 NOTE — Patient Instructions (Signed)
Inject 0.2 ml twice daily of Cortef  3 potassium daily  Bring BP monitor

## 2014-10-22 ENCOUNTER — Ambulatory Visit (INDEPENDENT_AMBULATORY_CARE_PROVIDER_SITE_OTHER): Payer: PPO | Admitting: Neurology

## 2014-10-22 ENCOUNTER — Encounter: Payer: Self-pay | Admitting: Neurology

## 2014-10-22 VITALS — BP 170/92 | HR 76 | Ht 61.0 in | Wt 117.0 lb

## 2014-10-22 DIAGNOSIS — R269 Unspecified abnormalities of gait and mobility: Secondary | ICD-10-CM | POA: Diagnosis not present

## 2014-10-22 DIAGNOSIS — G35 Multiple sclerosis: Secondary | ICD-10-CM | POA: Diagnosis not present

## 2014-10-22 DIAGNOSIS — G894 Chronic pain syndrome: Secondary | ICD-10-CM | POA: Diagnosis not present

## 2014-10-22 NOTE — Progress Notes (Signed)
Chief Complaint  Patient presents with  . Multiple Sclerosis    Katrina Rivera had two syncope events on 10/08/14 and was taken to the ED for evaluation.  Katrina Rivera denies dizziness prior to the event but had a strange sensation right before losing consciousness.  Katrina Rivera had a normal work-up and was told to follow up here.  Katrina Rivera has not had any further episodes.  Katrina Rivera has noticed increase pain and burning in her bilateral legs.  . Pruritis    Reports having generalized itching on her arms, back and head.  Says these symptom has been present for years but has recently worsened.      [facility]  PATIENT: Katrina Rivera DOB: August 24, 1945   REASON FOR VISIT: follow up for MS, abnormal gait, neuropathic pain, RLS  HISTORY OF PRESENT ILLNESS: Katrina Rivera is a very pleasant 69 year-old right-handed woman, with relapsing remitting multiple sclerosis, was treated with Betaseron 4 -5 years, but could not tolerate the side effect, now is not on any immunomodulation therapy. neuropathic pain of bilateral lower extremities.   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   Katrina Rivera has baseline gait difficulty, Katrina Rivera fell in May 2013, fractured both elbows and her left knee cap.  Katrina Rivera intermittently has been using a cane or walker.  Katrina Rivera could not tolerate Requip because of nausea. Katrina Rivera has been on gabapentin 300 mg 3 bid. Katrina Rivera gained significant weight when on Lyrica, and therefore was switched back to gabapentin. Katrina Rivera has orthostatic hypotension, and is on  midodrine 2.5 mg and fludrocortisone 0.1 mg 1 in AM and 2 at night, and Katrina Rivera is also on 20 mg of hydrocortisone.   Katrina Rivera complains of bilateral lower extremity burning stinging sensation, getting worse when Katrina Rivera sits still, difficulty falling into sleep, Katrina Rivera has the urge to move because of bilateral extremity discomfort, has tried Requip, could not tolerate it because of GI side effects, Neuprol patch does not work either, Katrina Rivera also tried Elavil, Cymbalta in the past, could not tolerate it due to side effect.   Jan 01/2014:  Katrina Rivera came in urgently for acute worsening of her generalized condition since her lumbar decompression surgery in January 24 2013 by Dr. Marikay Alar, prior to surgery, Katrina Rivera suffered left-sided low back pain, radiating pain to bilateral lower extremity, left is much worse than her right side, there was left L5-S1 extraforaminal herniated nucleus pulposus with left L5 radiculopathy Katrina Rivera had left L5-S1 extraforaminal microdiscectomy utilizing microscopic dissection.  Katrina Rivera had overnight stay at the hospital the day after surgery, Katrina Rivera fell when trying to get up using bathroom, landed on her left side, surgery did help her lower back pain, radiating pain to her left leg, but Katrina Rivera complained of worsening bilateral lower extremity deep achy pain, worsening gait difficulty, Katrina Rivera has not used walker for 3 years, now Katrina Rivera began to use her walker, Katrina Rivera felt her skin is so tight at bilateral lower extremity, left leg swelling, also has difficulty swallowing, blurred vision, worsening fatigue, difficulty concentration, Katrina Rivera has not had MRI evaluation for her multiple sclerosis for many years, is not on  any immunomodulation therapy   UPDATE Mar 01, 2014:  Katrina Rivera came in with a list of complains, much worse compared to presurgical level. Katrina Rivera complains of blurred vision, difficulty sleeping, difficulty concentrating, dizziness, worsening gait difficulty, The most bothersome symptoms are bilateral lower extremity pain,  constant, knife cutting pain, left worse than right  Left leg showed no DVT on doppler study. Katrina Rivera had 15 LB weight gain over one month, more difficulty sleepy, body shaking, more difficulty sleepy.  Katrina Rivera has tried fentanyl patch 50 mcg, Trileptal, Neurontin without helping her symptoms .Katrina Rivera is going to be seen by her surgeon Dr. Yetta Barre in 3 days  We have reviewed MRI of the brain cervical lumbar spine together, these were done at [facility] in January 2015  MRI lumbar: L5-S1: disc bulging and facet hypertrophy, status post microdiscectomy on the left, with expected post-surgical changes. Multi-level facet hypertrophy, with no spinal stenosis or foraminal narrowing.  MRI cervical: Possible small chronic demyelinating plaque at C6 on the right side. No abnormal enhancing lesions  MRI brain: Multiple supratentorial and 1 infratentorial chronic demyelinating plaques. No acute plaques. Mild cerebellar tonsillar ectopia   We also reviewed, and compared to her previous MRI in 2010 from [facility], there was no significant change in the brain lesions,   UPDATE 08/22/13 Patient returns for follow up. Her biggest  complaint today is restless legs. Katrina Rivera has never tried Neupro. Katrina Rivera has had multiple trials of medications for her chronic pain. Katrina Rivera is currently taking fentanyl, Trileptal, Cymbalta, lidocaine gel  UPDATE Feb 27 2014: Katrina Rivera is not on any long-term immunomodulation therapy for her relapsing remitting multiple sclerosis, neurological deficit has been fairly stable, Katrina Rivera has baseline gait difficulty, the most bothersome symptoms for her is low back pain, bilateral lower extremity deep achy pain, radiating pain from left lower back to her left leg,  Katrina Rivera was recently found to have very low cortisol level, under endocrinologist Dr. Remus Blake care,  UPDATE July 23 2014:  Katrina Rivera had lumbar decompression by Dr. Marikay Alar in Jun 18 2014, which did help her low back pain, but now Katrina Rivera experienced worsening  left lower extremity spasm, left calf spasm so hard, as if a knot was tied, difficulty walking, Katrina Rivera was giving a steroid package, no help,  Katrina Rivera complained excessive weight gain with Lyrica, Neurontin did not help,  UPDATE July 27th 2016: Katrina Rivera came in with a list of complaints, continue complains of unbalanced, staggering, generalized weakness, numbness tingling at bilateral lower extremity, worsening at her left leg, frequent urination, difficulty concentrating, difficulty with multitasking  UPDATE Oct 22 2014: Katrina Rivera had a syncope episode in October 08 2014, was taken to the emergency room, have reviewed ED record, blood pressure was 200/60s laboratory showed normal CBC, CMP,  this happened in the setting of missing her hydrocortisone dose, because of nausea, lack of appetite, dehydration, Katrina Rivera is taking hydrocortisone because of Addison's disease, now Katrina Rivera is on hydrocortisone shots,  Katrina Rivera is back to her baseline now, mild gait difficulty, also complains of anxiety, constant bilateral lower chamber paresthesia, Katrina Rivera wants to get off Cymbalta 30 mg daily, worry about the long-term side effect.  Katrina Rivera also complains of bilateral neck, shoulder or upper extremity itching,  REVIEW OF SYSTEMS: Full 14 system review of systems performed and notable only for those listed, all others are neg:  Fatigue, runny nose, trouble swallowing, drooling, blurred vision, heat intolerance, excessive thirst, swollen abdomen, abdominal pain, constipation, nausea, bowel incontinence, restless leg, insomnia, frequent awakening, daytime  sleepiness, snoring, difficulty urinating, frequent urination, achy muscles, muscle cramps, walking difficulty, rash, bruise easily, memory loss, headaches, numbness, weakness, weakness, tremor. Depression anxiety, decreased concentration                                      ALLERGIES: No Known Allergies  HOME MEDICATIONS: Outpatient Prescriptions Prior to Visit  Medication Sig Dispense Refill  .  bifidobacterium infantis (ALIGN) capsule Take 1 capsule by mouth daily.    . calcium-vitamin D (OSCAL WITH D) 500-200 MG-UNIT per tablet Take 1 tablet by mouth daily with breakfast.     . docusate sodium (COLACE) 100 MG capsule Take 100 mg by mouth daily.     . DULoxetine (CYMBALTA) 30 MG capsule Take 1 capsule (30 mg total) by mouth daily. 90 capsule 3  . fentaNYL (DURAGESIC - DOSED MCG/HR) 25 MCG/HR patch Place 2 patches (50 mcg total) onto the skin every 3 (three) days. (Patient taking differently: Place 25 mcg onto the skin every 3 (three) days. ) 30 patch 0  . Ferrous Sulfate Dried 200 (65 FE) MG TABS Take 65 mg by mouth daily.    . fludrocortisone (FLORINEF) 0.1 MG tablet One tablet in the morning and two tablets at night. (Patient taking differently: Take 0.15 mg by mouth daily. ) 270 tablet 3  . fluticasone (FLONASE) 50 MCG/ACT nasal spray Place 2 sprays into both nostrils at bedtime as needed for allergies or rhinitis.    . Multiple Vitamin (MULITIVITAMIN WITH MINERALS) TABS Take 1 tablet by mouth daily.    . NONFORMULARY OR COMPOUNDED ITEM Meloxicam 0.5%, Gabapentin 6%, Lidocaine 2%, Prilocaine 2% Appt 1-2 grams to the affected area(s) 3-4 times daily    . Polyethyl Glycol-Propyl Glycol (SYSTANE OP) Apply 2 drops to eye daily as needed (for dry eyes).    . potassium chloride (K-DUR,KLOR-CON) 10 MEQ tablet Take 2 in the morning and two at bedtime. 360 tablet 3  . promethazine (PHENERGAN) 12.5 MG tablet Take 1 tablet (12.5 mg total) by mouth every 8 (eight) hours as needed for nausea or vomiting. 30 tablet 2  . tamsulosin (FLOMAX) 0.4 MG CAPS capsule Take 0.4 mg by mouth daily after breakfast.    . tiZANidine (ZANAFLEX) 2 MG tablet Take 1 tablet (2 mg total) by mouth 3 (three) times daily. 90 tablet 6  . traZODone (DESYREL) 150 MG tablet Take 150 mg by mouth at bedtime.    Marland Kitchen trimethoprim (TRIMPEX) 100 MG tablet Take 100 mg by mouth at bedtime.     Marland Kitchen UNABLE TO FIND Med Name: Compound Cream:  meloxicam 0.5%, doxepin 3%, amantadine 3%, dextromethorphan 2%, lidocaine 2% ([employer] [phone])    . cyclobenzaprine (FLEXERIL) 10 MG tablet Take 1 tablet (10 mg total) by mouth 3 (three) times daily as needed for muscle spasms. 270 tablet 0  . methylPREDNISolone (MEDROL) 4 MG tablet Take 1/2 tablet twice a day (Patient taking differently: Take 2 mg by mouth at bedtime. Take 1/2 tablet twice a day) 30 tablet 3  . metoCLOPramide (REGLAN) 10 MG tablet Take 1 tablet (10 mg total) by mouth every 6 (six) hours. Prn nausea 30 tablet 0  . ondansetron (ZOFRAN) 4 MG tablet Take 1 tablet every 4 hours as needed for nausea or vomiting. 50 tablet 1  . oxyCODONE-acetaminophen (PERCOCET/ROXICET) 5-325 MG per tablet Take 1-2 tablets by mouth every 4 (four) hours as needed for moderate pain.  90 tablet 0   No facility-administered medications prior to visit.    PAST MEDICAL HISTORY: Past Medical History  Diagnosis Date  . Fibromyalgia   . Restless leg syndrome   . Multiple sclerosis     doesn't take any meds  . Arthritis   . Osteoporosis   . Hypotension   . Headache(784.0)   . GERD (gastroesophageal reflux disease)   . Anxiety     takes Xanax nightly  . IBS (irritable bowel syndrome)     takes Librarian, academic daily  . Seasonal allergies     takes Zyrtec daily;uses Flonase daily as needed  . Hypotension     takes Florinef daily  . History of bronchitis     many yrs ago   . Dizziness     if b/p drops   . Peripheral neuropathy   . Weakness     numbness and tingling  . Joint pain   . Chronic back pain     stenosis  . Urinary frequency     takes Flomax daily  . Nocturia   . Anemia     takes Ferrous Sulfate daily  . History of blood transfusion     no abnormal reaction noted  . Addison's disease     takes Solu Cortef daily  . Hypokalemia     takes Potassium daily  . Depression     takes Cymbalta daily  . Insomnia     takes Trazodone nightly  . Syncope   . Multiple  sclerosis   . Fibromyalgia     PAST SURGICAL HISTORY: Past Surgical History  Procedure Laterality Date  . Cesarean section  1973/1977    x2  . Eye surgery      bilateral - /w IOL  . Lumbar laminectomy/decompression microdiscectomy Left 01/24/2013    Procedure: LUMBAR FIVE TO SACRAL ONE LUMBAR LAMINECTOMY/DECOMPRESSION MICRODISCECTOMY 1 LEVEL;  Surgeon: Tia Alert, MD;  Location: [facility];  Service: Neurosurgery;  Laterality: Left;  . Fracture surgery Right     rods and screws  . Cholecystectomy  1997  . Abdominal hysterectomy  1988  . Appendectomy  1988  . Colectomy  1990  . Colonoscopy    . Esophagogastroduodenoscopy    . Maximum access (mas)posterior lumbar interbody fusion (plif) 1 level N/A 06/18/2014    Procedure: MAXIMUM ACCESS SURGERY POSTERIOR LUMBAR INTERBODY FUSION LUMBAR FIVE TO SACRAL ONE ;  Surgeon: Tia Alert, MD;  Location: [facility];  Service: Neurosurgery;  Laterality: N/A;    FAMILY HISTORY: Family History  Problem Relation Age of Onset  . Hypertension Mother   . Stroke Mother   . Heart attack Father     SOCIAL HISTORY: Social History   Social History  . Marital Status: Married    Spouse Name: Gabriel Rung  . Number of Children: 2  . Years of Education: 12   Occupational History  .      Disabled   Social History Main Topics  . Smoking status: Never Smoker   . Smokeless tobacco: Never Used  . Alcohol Use: No  . Drug Use: No  . Sexual Activity: Not on file   Other Topics Concern  . Not on file   Social History Narrative   Pt lives at home with spouse. ([name])   Caffeine Use: 1 cups daily.   Right handed.   Disabled.   Education - high school   Patient has two adult children.     PHYSICAL EXAM  Filed  Vitals:   10/22/14 1609  BP: 170/92  Pulse: 76  Height: 5\' 1"  (1.549 m)  Weight: 117 lb (53.071 kg)   Body mass index is 22.12 kg/(m^2).  PHYSICAL EXAMNIATION:  Gen: NAD, conversant, well nourised, obese, well groomed                      Cardiovascular: Regular rate rhythm, no peripheral edema, warm, nontender. Eyes: Conjunctivae clear without exudates or hemorrhage Neck: Supple, no carotid bruise. Pulmonary: Clear to auscultation bilaterally   NEUROLOGICAL EXAM:  MENTAL STATUS: Speech:    Speech is normal; fluent and spontaneous with normal comprehension.  Cognition:    The patient is oriented to person, place, and time;     recent and remote memory intact;     language fluent;     normal attention, concentration,     fund of knowledge.  CRANIAL NERVES: CN II: Visual fields are full to confrontation. Pupils were equal round reactive to light. CN III, IV, VI: extraocular movement are normal. No ptosis. CN V: Facial sensation is intact to pinprick in all 3 divisions bilaterally. Corneal responses are intact.  CN VII: Face is symmetric with normal eye closure and smile. CN VIII: Hearing is normal to rubbing fingers CN IX, X: Palate elevates symmetrically. Phonation is normal. CN XI: Head turning and shoulder shrug are intact CN XII: Tongue is midline with normal movements and no atrophy.  MOTOR: Katrina Rivera has mild bilateral lower extremity spasticity, no significant upper extremity or lower extremity muscle weakness.  REFLEXES: Reflexes are 2+ and symmetric at the biceps, triceps, knees, and ankles. Plantar responses are flexor.  SENSORY: Light touch, pinprick, position sense, and vibration sense are intact in fingers and toes.  COORDINATION: Rapid alternating movements and fine finger movements are intact. There is no dysmetria on finger-to-nose and heel-knee-shin. There are no abnormal or extraneous movements.   GAIT/STANCE: Wide based, mildly unsteady, could not perform tandem walking   DIAGNOSTIC DATA (LABS, IMAGING, TESTING) - I reviewed patient records, labs, notes, testing and imaging myself where available.  Lab Results  Component Value Date   WBC 8.8 10/09/2014   HGB 12.3 10/09/2014    HCT 37.9 10/09/2014   MCV 91.1 10/09/2014   PLT 208 10/09/2014      Component Value Date/Time   NA 136 10/09/2014 0231   K 4.6 10/09/2014 0231   CL 100* 10/09/2014 0231   CO2 29 10/09/2014 0231   GLUCOSE 109* 10/09/2014 0231   BUN 11 10/09/2014 0231   CREATININE 0.84 10/09/2014 0231   CREATININE 0.85 11/06/2013 0840   CALCIUM 9.4 10/09/2014 0231   PROT 6.5 02/10/2014 0838   ALBUMIN 4.0 02/10/2014 0838   AST 17 02/10/2014 0838   ALT 14 02/10/2014 0838   ALKPHOS 37* 02/10/2014 0838   BILITOT 0.3 02/10/2014 0838   GFRNONAA >60 10/09/2014 0231   GFRAA >60 10/09/2014 0231    Lab Results  Component Value Date   TSH 2.75 06/09/2014   ASSESSMENT AND PLAN  69 y.o. year old female   Relapsing remitting multiple sclerosis:  Katrina Rivera is not long term immunomodulation therapy  Most recent MRI was in August 2015, MRI, and clinical wise Katrina Rivera is stable  Lumbar radiculopathy  Chronic low back pain, left radicular pain  On low-dose fentanyl patch 25 g every 3 days Bilateral lower extremity paresthesia  On polypharmacy treatment, including trazodone 150 mg, 10 of Flexeril Anxiety: Chronic insomnia  Keep trazodone 150 mg daily  Is okay to stop Cymbalta, if Katrina Rivera continue complains of anxiety, may consider SSRI treatment,     Levert Feinstein, M.D. Ph.D.  [facility] [address] [place], [place] [address] Phone: [phone] Fax:      [phone]

## 2014-10-23 ENCOUNTER — Ambulatory Visit: Payer: PPO | Admitting: Nurse Practitioner

## 2014-11-13 ENCOUNTER — Ambulatory Visit (INDEPENDENT_AMBULATORY_CARE_PROVIDER_SITE_OTHER): Payer: PPO | Admitting: Endocrinology

## 2014-11-13 ENCOUNTER — Encounter: Payer: Self-pay | Admitting: Endocrinology

## 2014-11-13 ENCOUNTER — Other Ambulatory Visit: Payer: Self-pay | Admitting: *Deleted

## 2014-11-13 VITALS — BP 150/78 | HR 72 | Temp 98.2°F | Resp 14 | Ht 61.0 in | Wt 118.0 lb

## 2014-11-13 DIAGNOSIS — G903 Multi-system degeneration of the autonomic nervous system: Secondary | ICD-10-CM

## 2014-11-13 DIAGNOSIS — E2740 Unspecified adrenocortical insufficiency: Secondary | ICD-10-CM

## 2014-11-13 DIAGNOSIS — I951 Orthostatic hypotension: Secondary | ICD-10-CM

## 2014-11-13 MED ORDER — "INSULIN SYRINGE 31G X 5/16"" 0.5 ML MISC": Status: DC

## 2014-11-13 MED ORDER — HYDROCORTISONE NA SUCCINATE PF 100 MG IJ SOLR: INTRAMUSCULAR | Status: DC

## 2014-11-13 NOTE — Progress Notes (Signed)
Patient ID: Katrina Rivera, female   DOB: [birth year], 69 y.o.   MRN: [id number]   Subjective:         Chief complaint: Follow-up of adrenal insufficiency   PAST history: She has had long-standing problems with orthostatic hypotension and also hyponatremia. Has been diagnosed with dysautonomia and has multiple other problems related to autonomic neuropathy.   She has been on Florinef since 2004 previously taking 3 tablets daily along with 5 tablets of potassium which had previously controlled her symptoms well . Because of persistent orthostatic symptoms she had been tried on midodrine on 05/28/12 but this was later stopped when blood pressure increased.  She  had medication adjustments done frequently over the last year for regulating her blood pressure.  RECENT history:   Dysautonomic orthostatic hypotension  She has previously been on variable doses of Florinef, as much as 3 a day Her dose has been gradually reduced more recently Previously since her blood pressure was running high  she is taking only 1 tablet in the morning and half in the evening.   More recently her blood pressure has still been high, probably from starting her hydrocortisone and recent systolic readings have been about 151-155 sitting and 122-140 standing Currently using an [employer] blood pressure monitor on her arm She tried to use this in the office and obtained a  systolic reading 20 mm more than the office reading  She is now taking 3 tablets of potassium instead of 4  Lab Results  Component Value Date   CREATININE 0.84 10/09/2014   BUN 11 10/09/2014   NA 136 10/09/2014   K 4.6 10/09/2014   CL 100* 10/09/2014   CO2 29 10/09/2014     Adrenal Insufficiency:   This is secondary to pituitary dysfunction  Prior testing included Cortrosyn test baseline with stimulated level of 15.7, baseline 3.9.  Also confirmed by 24 hour urine free cortisol which was only 3.  Although previously she had tolerated oral  hydrocortisone and small doses with improvement in her symptoms she did not continue this long-term She was again symptomatic with weight loss, decreased appetite, nausea and also diarrhea Cortrosyn stimulation test on 03/12/14 showed baseline cortisol level of 0.3 and post injection of 1.1 only  She had GI side effects from hydrocortisone and prednisone and was given hydrocortisone injections using insulin syringe  After her back surgery she was off her hydrocortisone injections and was initially tolerating methylprednisolone twice a day. More since she is back on hydrocortisone injections and was started on 10 mg twice a day which her husband gives her  Although she thinks she still feels tired and nauseated her weight is slightly better. Her nausea is mostly when she wakes up and late in the evening She is able to eat a fairly good meal in the evening and also lunch  Sodium is normal recently  Wt Readings from Last 3 Encounters:  11/13/14 118 lb (53.524 kg)  10/22/14 117 lb (53.071 kg)  10/15/14 116 lb 12.8 oz (52.98 kg)    General Endocrinology:  ?  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  Always use your most recent med list.               alfuzosin 10 MG 24 hr tablet  Commonly known as:  UROXATRAL  Take 10 mg by mouth daily with breakfast.     bethanechol 25 MG tablet  Commonly known as:  URECHOLINE  Take 25 mg by mouth 2 (two) times daily.     bifidobacterium infantis capsule  Take 1 capsule by mouth  daily.     calcium-vitamin D 500-200 MG-UNIT tablet  Commonly known as:  OSCAL WITH D  Take 1 tablet by mouth daily with breakfast.     docusate sodium 100 MG capsule  Commonly known as:  COLACE  Take 100 mg by mouth daily.     fentaNYL 25 MCG/HR patch  Commonly known as:  DURAGESIC - dosed mcg/hr  Place 2 patches (50 mcg total) onto the skin every 3 (three) days.     Ferrous Sulfate Dried 200 (65 FE) MG Tabs  Take 65 mg by mouth daily.     FLEXERIL 10 MG tablet  Generic drug:  cyclobenzaprine  Take 10 mg by mouth 3 (three) times daily as needed for muscle spasms.     fludrocortisone 0.1 MG tablet  Commonly known as:  FLORINEF  One tablet in the morning and two tablets at night.     fluticasone 50 MCG/ACT nasal spray  Commonly known as:  FLONASE  Place 2 sprays into both nostrils at bedtime as needed for allergies or rhinitis.     hydrocortisone sodium succinate 100 MG Solr injection  Commonly known as:  SOLU-CORTEF  Inject 0.2cc's twice a day     INSULIN SYRINGE .5CC/31GX5/16" 31G X 5/16" 0.5 ML Misc  Use 2 per day to inject Hydrocortisone     multivitamin with minerals Tabs tablet  Take 1 tablet by mouth daily.     NONFORMULARY OR COMPOUNDED ITEM  Meloxicam 0.5%, Gabapentin 6%, Lidocaine 2%, Prilocaine 2% Appt 1-2 grams to the affected area(s) 3-4 times daily     potassium chloride 10 MEQ tablet  Commonly known as:  K-DUR,KLOR-CON  Take 2 in the morning and two at bedtime.     promethazine 12.5 MG tablet  Commonly known as:  PHENERGAN  Take 1 tablet (12.5 mg total) by mouth every 8 (eight) hours as needed for nausea or vomiting.     SYSTANE OP  Apply 2 drops to eye daily as needed (for dry eyes).     tiZANidine 2 MG tablet  Commonly known as:  ZANAFLEX  Take 1 tablet (2 mg total) by mouth 3 (three) times daily.     traZODone 150 MG tablet  Commonly known as:  DESYREL  Take 150 mg by mouth at bedtime.     trimethoprim 100 MG tablet  Commonly known as:   TRIMPEX  Take 100 mg by mouth at bedtime.     UNABLE TO FIND  Med Name: Compound Cream: meloxicam 0.5%, doxepin 3%, amantadine 3%, dextromethorphan 2%, lidocaine 2% ([employer] [phone])        Review of Systems   NAUSEA: This has been a persistent problem and has not been relieved by Zofran or Reglan given in the ER.  She was tried on Phenergan 12.5 mg  but she does not think his health.  Has not tried a higher dose.  Etiology not determined and she has discussed with gastroenterologist  She continues to have fatigue and insomnia  Current information is a copy of the previous note:  Increased sweating: Has had hot flashes and periodic sweating in the daytime  Did not tolerate propantheline. Had been slightly better with Climara 0.1 mg and subsequently  taking Vivelle which she stopped because of cost. Effexor was also tried but she did not tolerate this and also sweating was no better She was also tried on clonidine patches without much relief She was retried on hormone patches but she stopped this because he felt it was causing drowsiness She does not complain of excessive symptoms now  Genitourinary: Positive for difficulty urinating.       She was taking urecholine for her  neurogenic bladder  but stopped this because of excessive salivation Neurological: Positive for numbness.       Has peripheral neuropathy, symptomatic of unclear etiology and followed by neurologist  She has had significant amount of neuropathic pain in her feet and legs and was not relieved with large doses of gabapentin.  She is on Cymbalta and Trileptal and also local lidocaine     She can have occasional diarrhea, followed by gastroenterologist    Objective:   Physical Exam  BP 150/78 mmHg  Pulse 72  Temp(Src) 98.2 F (36.8 C)  Resp 14  Ht  (1.549 m)  Wt 118 lb (53.524 kg)  BMI 22.31 kg/m2  SpO2 96%  Repeat blood pressure standing is 118/66 on the right and 120/68 on  the left No supraclavicular fat pads or facial changes to suggest Cushing's syndrome  No pedal edema  Assessment:      Adrenal insufficiency: 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 and very low cortisol levels on the stimulation test  She previously had been able take methylprednisolone orally without GI side effects Since she did not want to continue the oral form because of positive nausea from this she is now taking injectable cortisone twice a day  She does not think she has improvement in her nausea but is able to eat a little better and weight is improving Also her blood pressure appears to be relatively higher   Orthostatic dysautonomic hypotension syndrome:   Her blood pressure appears to be getting relatively higher with one and a half Florinef per day Has similar readings at home also Potassium has been normal and will recheck on next visit   Plan:      She will reduce her Florinef to half tablet only in the morning She can continue potassium  3 tablets Other medications as above including injectable hydrocortisone, new prescription sent    For her nausea she can try taking Phenergan 25 mg with her evening meal, if not better will need to have a follow-up with her gastroenterologist. To discuss insomnia and fatigue with PCP  [facility]

## 2014-11-13 NOTE — Patient Instructions (Addendum)
Florinef 1/2 in am only

## 2014-12-03 NOTE — Telephone Encounter (Signed)
Error

## 2014-12-09 ENCOUNTER — Other Ambulatory Visit: Payer: PPO

## 2014-12-09 ENCOUNTER — Other Ambulatory Visit (INDEPENDENT_AMBULATORY_CARE_PROVIDER_SITE_OTHER): Payer: PPO

## 2014-12-09 DIAGNOSIS — E2740 Unspecified adrenocortical insufficiency: Secondary | ICD-10-CM

## 2014-12-09 DIAGNOSIS — D509 Iron deficiency anemia, unspecified: Secondary | ICD-10-CM

## 2014-12-09 LAB — COMPREHENSIVE METABOLIC PANEL
ALK PHOS: 29 U/L — AB (ref 39–117)
ALT: 10 U/L (ref 0–35)
AST: 18 U/L (ref 0–37)
Albumin: 4.1 g/dL (ref 3.5–5.2)
BILIRUBIN TOTAL: 0.5 mg/dL (ref 0.2–1.2)
BUN: 11 mg/dL (ref 6–23)
CALCIUM: 10 mg/dL (ref 8.4–10.5)
CO2: 31 mEq/L (ref 19–32)
Chloride: 101 mEq/L (ref 96–112)
Creatinine, Ser: 1.1 mg/dL (ref 0.40–1.20)
GFR: 52.62 mL/min — AB (ref >60.00)
GLUCOSE: 109 mg/dL — AB (ref 70–99)
Potassium: 4.6 mEq/L (ref 3.5–5.1)
Sodium: 137 mEq/L (ref 135–145)
TOTAL PROTEIN: 6.6 g/dL (ref 6.0–8.3)

## 2014-12-09 LAB — CBC
HCT: 36.5 % (ref 36.0–46.0)
HEMOGLOBIN: 12 g/dL (ref 12.0–15.0)
MCHC: 32.9 g/dL (ref 30.0–36.0)
MCV: 92.1 fl (ref 78.0–100.0)
PLATELETS: 222 10*3/uL (ref 150.0–400.0)
RBC: 3.97 Mil/uL (ref 3.87–5.11)
RDW: 13.7 % (ref 11.5–15.5)
WBC: 7 10*3/uL (ref 4.0–10.5)

## 2014-12-11 ENCOUNTER — Ambulatory Visit (INDEPENDENT_AMBULATORY_CARE_PROVIDER_SITE_OTHER): Payer: PPO | Admitting: Endocrinology

## 2014-12-11 VITALS — BP 122/78 | HR 87 | Temp 98.3°F | Resp 14 | Ht 61.0 in | Wt 113.4 lb

## 2014-12-11 DIAGNOSIS — R5382 Chronic fatigue, unspecified: Secondary | ICD-10-CM

## 2014-12-11 DIAGNOSIS — G903 Multi-system degeneration of the autonomic nervous system: Secondary | ICD-10-CM | POA: Diagnosis not present

## 2014-12-11 DIAGNOSIS — E2740 Unspecified adrenocortical insufficiency: Secondary | ICD-10-CM

## 2014-12-11 DIAGNOSIS — I951 Orthostatic hypotension: Secondary | ICD-10-CM

## 2014-12-11 NOTE — Progress Notes (Signed)
Patient ID: Katrina Rivera, female   DOB: 11-02-1945, 70 y.o.   MRN: [id number]   Subjective:         Chief complaint: Follow-up of adrenal insufficiency   PAST history: She has had long-standing problems with orthostatic hypotension and also hyponatremia. Has been diagnosed with dysautonomia and has multiple other problems related to autonomic neuropathy.   She has been on Florinef since 2004 previously taking 3 tablets daily along with 5 tablets of potassium which had previously controlled her symptoms well . Because of persistent orthostatic symptoms she had been tried on midodrine on 05/28/12 but this was later stopped when blood pressure increased.  She  had medication adjustments done frequently over the last year for regulating her blood pressure.  RECENT history:   Dysautonomic orthostatic hypotension  She has previously been on variable doses of Florinef, as much as 3 a day Her dose has been gradually reduced in the last few months  On her last visit she was taking only 1 tablet in the morning and half in the evening.   Since her blood pressure was relatively high she was told to try only half a tablet daily.  She does not feel lightheaded unless she gets up quickly in the morning. Recent blood pressure readings at home checked about once a week range from 113-123/66-70 standing and about 10-15 mm higher sitting    hypokalemia:  She is now taking 4 tablets of potassium instead of the recommended dose of 3 tablets and he is taking 2 tablets in the morning on empty stomach and 2 at bedtime  Lab Results  Component Value Date   CREATININE 1.10 12/09/2014   BUN 11 12/09/2014   NA 137 12/09/2014   K 4.6 12/09/2014   CL 101 12/09/2014   CO2 31 12/09/2014     Adrenal Insufficiency:   This is secondary to pituitary dysfunction  Prior testing included Cortrosyn test baseline with stimulated level of 15.7, baseline 3.9.  Also confirmed by 24 hour urine free cortisol which  was only 3.  Although previously she had tolerated oral hydrocortisone and small doses with improvement in her symptoms she did not continue this long-term She was again symptomatic with weight loss, decreased appetite, nausea and also diarrhea Cortrosyn stimulation test on 03/12/14 showed baseline cortisol level of 0.3 and post injection of 1.1 only  She had GI side effects from hydrocortisone and prednisone and was given hydrocortisone injections using insulin syringe  After her back surgery she was off her hydrocortisone injections and was initially tolerating methylprednisolone twice a day. Subsequently is on  hydrocortisone injections  10 mg twice a day which her husband gives her  Although she thinks she still feels tired  she does not have as much nausea Also she thinks she is eating fairly well although her weight has come down Her nausea is mostly when she wakes up and late in the evening She is able to eat a fairly good meal in the evening and also lunch  Sodium is normal recently  Wt Readings from Last 3 Encounters:  12/11/14 113 lb 6.4 oz (51.438 kg)  11/13/14 118 lb (53.524 kg)  10/22/14 117 lb (53.071 kg)    General Endocrinology:  ?  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  Always use your most recent med list.               alfuzosin 10 MG 24 hr tablet  Commonly known as:  UROXATRAL  Take 10 mg by mouth daily with breakfast.     bifidobacterium infantis capsule  Take 1 capsule by mouth daily.      calcium-vitamin D 500-200 MG-UNIT tablet  Commonly known as:  OSCAL WITH D  Take 1 tablet by mouth daily with breakfast.     docusate sodium 100 MG capsule  Commonly known as:  COLACE  Take 100 mg by mouth daily.     fentaNYL 25 MCG/HR patch  Commonly known as:  DURAGESIC - dosed mcg/hr  Place 2 patches (50 mcg total) onto the skin every 3 (three) days.     Ferrous Sulfate Dried 200 (65 FE) MG Tabs  Take 65 mg by mouth daily.     FLEXERIL 10 MG tablet  Generic drug:  cyclobenzaprine  Take 10 mg by mouth 3 (three) times daily as needed for muscle spasms.     fludrocortisone 0.1 MG tablet  Commonly known as:  FLORINEF  One tablet in the morning and two tablets at night.     fluticasone 50 MCG/ACT nasal spray  Commonly known as:  FLONASE  Place 2 sprays into both nostrils at bedtime as needed for allergies or rhinitis.     hydrocortisone sodium succinate 100 MG Solr injection  Commonly known as:  SOLU-CORTEF  Inject 0.2cc's twice a day     INSULIN SYRINGE .5CC/31GX5/16" 31G X 5/16" 0.5 ML Misc  Use 2 per day to inject Hydrocortisone     multivitamin with minerals Tabs tablet  Take 1 tablet by mouth daily.     NONFORMULARY OR COMPOUNDED ITEM  Meloxicam 0.5%, Gabapentin 6%, Lidocaine 2%, Prilocaine 2% Appt 1-2 grams to the affected area(s) 3-4 times daily     potassium chloride 10 MEQ tablet  Commonly known as:  K-DUR,KLOR-CON  Take 2 in the morning and two at bedtime.     promethazine 12.5 MG tablet  Commonly known as:  PHENERGAN  Take 1 tablet (12.5 mg total) by mouth every 8 (eight) hours as needed for nausea or vomiting.     SYSTANE OP  Apply 2 drops to eye daily as needed (for dry eyes).     traZODone 150 MG tablet  Commonly known as:  DESYREL  Take 150 mg by mouth at bedtime.     trimethoprim 100 MG tablet  Commonly known as:  TRIMPEX  Take 100 mg by mouth at bedtime.     UNABLE TO FIND  Med Name: Compound Cream: meloxicam 0.5%, doxepin 3%,  amantadine 3%, dextromethorphan 2%, lidocaine 2% ([employer] [phone])        Review of Systems   NAUSEA: This has been a persistent problem and has been more independent recently and takes Phenergan as needed   She continues to have fatigue and insomnia  Current information is a copy of the previous note:  Increased sweating: Has had hot flashes and periodic sweating in the daytime  Did not tolerate propantheline. Had been slightly better with Climara 0.1 mg and subsequently  taking Vivelle which she stopped because of cost. Effexor was also tried but she did not tolerate this and also sweating was no better She was also tried on clonidine patches without much relief She was retried on hormone patches but she stopped this because he felt it was causing drowsiness She does not complain of excessive symptoms now  Genitourinary: Positive for difficulty urinating.       She was taking urecholine for her  neurogenic bladder  but stopped this because of excessive salivation Neurological: Positive for numbness.       Has peripheral neuropathy, symptomatic of unclear etiology and followed by neurologist  She has had significant amount of neuropathic pain in her feet and legs and was not relieved with large doses of gabapentin.  She is on Cymbalta and Trileptal and also local lidocaine     She can have occasional diarrhea, followed by gastroenterologist    Objective:   Physical Exam  BP 122/78 mmHg  Pulse 87  Temp(Src) 98.3 F (36.8 C)  Resp 14  Ht 5\' 1"  (1.549 m)  Wt 113 lb 6.4 oz (51.438 kg)  BMI 21.44 kg/m2  SpO2 97%   she has ecchymosis at the sites of her injections  No pedal edema  Assessment:      Adrenal insufficiency:  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 and very low cortisol levels on the stimulation test Since she did not tolerate oral medications she is taking hydrocortisone twice a day She subjectively  feeling fairly good although she has tendency to chronic fatigue Not clear why her weight has gone down but may have been in a greater positive fluid balance previously    Orthostatic dysautonomic hypotension syndrome:   Her blood pressure appears to be well controlled with  half Florinef per day Has similar readings at home to the office reading Potassium has been normal and now high normal    Plan:      She will reduce her potassium to 3 tablets and take them with food especially in the morning  No change in hydrocortisone Advised her to apply pressure at the site of injections for 2 minutes after the injection process  To discuss insomnia and fatigue with PCP  [facility]

## 2014-12-11 NOTE — Patient Instructions (Signed)
Reduce potassium to 1 at lunch

## 2014-12-23 ENCOUNTER — Telehealth: Payer: Self-pay | Admitting: *Deleted

## 2014-12-23 ENCOUNTER — Other Ambulatory Visit: Payer: Self-pay | Admitting: Neurology

## 2014-12-23 MED ORDER — FENTANYL 25 MCG/HR TD PT72: 25.0000 ug | MEDICATED_PATCH | TRANSDERMAL | Status: DC

## 2014-12-23 NOTE — Telephone Encounter (Signed)
Rx for Fentanyl placed up front for pick up. 

## 2014-12-23 NOTE — Telephone Encounter (Signed)
Pt called requesting refill for fentaNYL (DURAGESIC - DOSED MCG/HR) 25 MCG/HR patch 3 mth refill. Pt advised RX will be ready within 24 hrs unless informed otherwise by RN

## 2014-12-25 ENCOUNTER — Telehealth: Payer: Self-pay | Admitting: Neurology

## 2014-12-25 NOTE — Telephone Encounter (Signed)
Pt called and would like to know if the handicap form can be placed with Rx at the front desk . She will be coming by today to pick rx up. May call [phone]

## 2014-12-25 NOTE — Telephone Encounter (Signed)
I have spoken with Katrina Rivera this afternoon--she sts. someone from this office called her yesterday and said they were mailing her handicap placard application.  I advised it would already have gone out in the mail--she will need to wait for it to arrive.  She is agreeable with this/fim

## 2015-01-05 ENCOUNTER — Ambulatory Visit: Payer: PPO | Admitting: Nurse Practitioner

## 2015-02-11 DIAGNOSIS — J0100 Acute maxillary sinusitis, unspecified: Secondary | ICD-10-CM | POA: Diagnosis not present

## 2015-02-11 DIAGNOSIS — R110 Nausea: Secondary | ICD-10-CM | POA: Diagnosis not present

## 2015-02-11 DIAGNOSIS — L298 Other pruritus: Secondary | ICD-10-CM | POA: Diagnosis not present

## 2015-02-12 ENCOUNTER — Telehealth: Payer: Self-pay | Admitting: *Deleted

## 2015-02-12 NOTE — Telephone Encounter (Signed)
Rx printed for Fentanyl on 05/28/14 - never picked up by patient - script destroyed.

## 2015-03-04 DIAGNOSIS — M1712 Unilateral primary osteoarthritis, left knee: Secondary | ICD-10-CM | POA: Diagnosis not present

## 2015-03-04 DIAGNOSIS — M25562 Pain in left knee: Secondary | ICD-10-CM | POA: Diagnosis not present

## 2015-03-04 DIAGNOSIS — R5383 Other fatigue: Secondary | ICD-10-CM | POA: Diagnosis not present

## 2015-03-04 DIAGNOSIS — M810 Age-related osteoporosis without current pathological fracture: Secondary | ICD-10-CM | POA: Diagnosis not present

## 2015-03-04 DIAGNOSIS — E559 Vitamin D deficiency, unspecified: Secondary | ICD-10-CM | POA: Diagnosis not present

## 2015-03-09 ENCOUNTER — Other Ambulatory Visit (INDEPENDENT_AMBULATORY_CARE_PROVIDER_SITE_OTHER): Payer: PPO

## 2015-03-09 DIAGNOSIS — R5382 Chronic fatigue, unspecified: Secondary | ICD-10-CM | POA: Diagnosis not present

## 2015-03-09 DIAGNOSIS — E46 Unspecified protein-calorie malnutrition: Secondary | ICD-10-CM | POA: Diagnosis not present

## 2015-03-09 DIAGNOSIS — R5383 Other fatigue: Secondary | ICD-10-CM | POA: Diagnosis not present

## 2015-03-09 DIAGNOSIS — R110 Nausea: Secondary | ICD-10-CM | POA: Diagnosis not present

## 2015-03-09 DIAGNOSIS — F339 Major depressive disorder, recurrent, unspecified: Secondary | ICD-10-CM | POA: Diagnosis not present

## 2015-03-09 DIAGNOSIS — E2740 Unspecified adrenocortical insufficiency: Secondary | ICD-10-CM | POA: Diagnosis not present

## 2015-03-09 DIAGNOSIS — R634 Abnormal weight loss: Secondary | ICD-10-CM | POA: Diagnosis not present

## 2015-03-09 LAB — BASIC METABOLIC PANEL
BUN: 19 mg/dL (ref 6–23)
CALCIUM: 10.4 mg/dL (ref 8.4–10.5)
CO2: 29 mEq/L (ref 19–32)
CREATININE: 1.11 mg/dL (ref 0.40–1.20)
Chloride: 101 mEq/L (ref 96–112)
GFR: 52.04 mL/min — AB (ref >60.00)
GLUCOSE: 97 mg/dL (ref 70–99)
POTASSIUM: 4.6 meq/L (ref 3.5–5.1)
Sodium: 137 mEq/L (ref 135–145)

## 2015-03-09 LAB — TSH: TSH: 1.01 u[IU]/mL (ref 0.35–4.50)

## 2015-03-09 LAB — T4, FREE: Free T4: 0.88 ng/dL (ref 0.60–1.60)

## 2015-03-13 ENCOUNTER — Encounter: Payer: Self-pay | Admitting: Endocrinology

## 2015-03-13 ENCOUNTER — Ambulatory Visit (INDEPENDENT_AMBULATORY_CARE_PROVIDER_SITE_OTHER): Payer: PPO | Admitting: Endocrinology

## 2015-03-13 VITALS — BP 134/70 | HR 82 | Temp 98.5°F | Resp 14 | Ht 61.0 in | Wt 104.6 lb

## 2015-03-13 DIAGNOSIS — E2740 Unspecified adrenocortical insufficiency: Secondary | ICD-10-CM

## 2015-03-13 NOTE — Patient Instructions (Signed)
Take 0.4cc hydrcortisone twice daily for 1 week then reduce pm dose to 0.2cc

## 2015-03-13 NOTE — Progress Notes (Signed)
Patient ID: Katrina Rivera, female   DOB: 1945/03/11, 70 y.o.   MRN: [id number]   Subjective:         Chief complaint: Follow-up of adrenal insufficiency   PAST history: She has had long-standing problems with orthostatic hypotension and also hyponatremia. Has been diagnosed with dysautonomia and has multiple other problems related to autonomic neuropathy.   She has been on Florinef since 2004 previously taking 3 tablets daily along with 5 tablets of potassium which had previously controlled her symptoms well . Because of persistent orthostatic symptoms she had been tried on midodrine on 05/28/12 but this was later stopped when blood pressure increased.  She  had medication adjustments done frequently over the last year for regulating her blood pressure.  RECENT history:   Dysautonomic orthostatic hypotension  She has previously been on variable doses of Florinef, as much as 3 a day Her dose has been gradually reduced in the last few months  On her last visit  her blood pressure was relatively high she was told to try only half a tablet daily. Subsequently her blood pressure at home had been fairly normal consistently although checking very sporadically and has not done any readings for 2-3 weeks  She does not feel lightheaded unless she gets up quickly from setting up     Hypokalemia:  She is now taking 2 tablets of potassium and potassium is normal   Lab Results  Component Value Date   CREATININE 1.11 03/09/2015   BUN 19 03/09/2015   NA 137 03/09/2015   K 4.6 03/09/2015   CL 101 03/09/2015   CO2 29 03/09/2015     Adrenal Insufficiency:   This is secondary to pituitary dysfunction  Prior testing included Cortrosyn test baseline with stimulated level of 15.7, baseline 3.9.  Also confirmed by 24 hour urine free cortisol which was only 3.  Although previously she had tolerated oral hydrocortisone and small doses with improvement in her symptoms she did not  continue this long-term She was again symptomatic with weight loss, decreased appetite, nausea and also diarrhea Cortrosyn stimulation test on 03/12/14 showed baseline cortisol level of 0.3 and post injection of 1.1 only  She had GI side effects from hydrocortisone and prednisone and was given hydrocortisone injections using insulin syringe  Currently is on  hydrocortisone injections  10 mg twice a day which her husband gives her  RECENT HISTORY: She says she has been losing weight progressively over the last few weeks possibly since last summer She feels more tired Also since her last visit she has had decreased appetite and persistent nausea She says that her PCP has evaluated her and no etiology found A few days ago is starting to take Remeron, also trying to increase intake with boost  She is able to eat a fairly good meal in the evening and also lunch  Sodium is normal recently  Wt Readings from Last 3 Encounters:  03/13/15 104 lb 9.6 oz (47.446 kg)  12/11/14 113 lb 6.4 oz (51.438 kg)  11/13/14 118 lb (53.524 kg)    General Endocrinology:  ?  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  Always use your most recent med list.               alfuzosin 10 MG 24 hr tablet  Commonly known as:  UROXATRAL  Take 10 mg by mouth daily with breakfast.     bifidobacterium infantis capsule  Take 1 capsule by mouth daily. Reported on 03/13/2015     calcium-vitamin D 500-200 MG-UNIT tablet  Commonly known as:  OSCAL WITH D    Take 1 tablet by mouth daily with breakfast.     docusate sodium 100 MG capsule  Commonly known as:  COLACE  Take 100 mg by mouth daily. Reported on 03/13/2015     fentaNYL 25 MCG/HR patch  Commonly known as:  DURAGESIC - dosed mcg/hr  Place 1 patch (25 mcg total) onto the skin every 3 (three) days.     Ferrous Sulfate Dried 200 (65 Fe) MG Tabs  Take 65 mg by mouth daily.     FLEXERIL 10 MG tablet  Generic drug:  cyclobenzaprine  Take 10 mg by mouth 3 (three) times daily as needed for muscle spasms. Reported on 03/13/2015     fludrocortisone 0.1 MG tablet  Commonly known as:  FLORINEF  One tablet in the morning and two tablets at night.     fluticasone 50 MCG/ACT nasal spray  Commonly known as:  FLONASE  Place 2 sprays into both nostrils at bedtime as needed for allergies or rhinitis.     hydrocortisone sodium succinate 100 MG Solr injection  Commonly known as:  SOLU-CORTEF  Inject 0.2cc's twice a day     INSULIN SYRINGE .5CC/31GX5/16" 31G X 5/16" 0.5 ML Misc  Use 2 per day to inject Hydrocortisone     mirtazapine 15 MG tablet  Commonly known as:  REMERON  Take 15 mg by mouth at bedtime.     REMERON PO  Take 15 mg by mouth 2 (two) times daily.     multivitamin with minerals Tabs tablet  Take 1 tablet by mouth daily.     NONFORMULARY OR COMPOUNDED ITEM  Meloxicam 0.5%, Gabapentin 6%, Lidocaine 2%, Prilocaine 2% Appt 1-2 grams to the affected area(s) 3-4 times daily     ondansetron 8 MG tablet  Commonly known as:  ZOFRAN  Take 8 mg by mouth 3 (three) times daily as needed for nausea or vomiting.     potassium chloride 10 MEQ tablet  Commonly known as:  K-DUR,KLOR-CON  Take 2 in the morning and two at bedtime.     promethazine 12.5 MG tablet  Commonly known as:  PHENERGAN  Take 1 tablet (12.5 mg total) by mouth every 8 (eight) hours as needed for nausea or vomiting.     SYSTANE OP  Apply 2 drops to eye daily as needed (for dry eyes).     traZODone 150 MG tablet   Commonly known as:  DESYREL  Take 150 mg by mouth at bedtime.     trimethoprim 100 MG tablet  Commonly known as:  TRIMPEX  Take 100 mg by mouth at bedtime.     UNABLE TO FIND  Med Name: Compound Cream: meloxicam 0.5%, doxepin 3%, amantadine 3%, dextromethorphan 2%, lidocaine 2% ([employer] [phone])     VITAMIN D3 SUPER STRENGTH  2000 units Caps  Generic drug:  Cholecalciferol  Take by mouth.        Review of Systems   NAUSEA: This has been a persistent problem and  and takes Phenergan as needed  Her gastroenterologist does not feel it is related to an underlying GI cause  She continues to have fatigue and insomnia  Current information is a copy of the previous note:  Increased sweating: Has had hot flashes and periodic sweating in the daytime  Did not tolerate propantheline. Had been slightly better with Climara 0.1 mg and subsequently  taking Vivelle which she stopped because of cost. Effexor was also tried but she did not tolerate this and also sweating was no better She was also tried on clonidine patches without much relief She was retried on hormone patches but she stopped this because he felt it was causing drowsiness She does not complain of excessive symptoms now  Genitourinary: Positive for difficulty urinating.       She was taking urecholine for her  neurogenic bladder  but stopped this because of excessive salivation Neurological: Positive for numbness.       Has peripheral neuropathy, symptomatic of unclear etiology and followed by neurologist  She has had significant amount of neuropathic pain in her feet and legs and was not relieved with large doses of gabapentin.  She is on Cymbalta and Trileptal and also local lidocaine     She can have occasional diarrhea, followed by gastroenterologist    Objective:   Physical Exam  BP 134/70 mmHg  Pulse 82  Temp(Src) 98.5 F (36.9 C)  Resp 14  Ht 5\' 1"  (1.549 m)  Wt 104 lb 9.6 oz (47.446  kg)  BMI 19.77 kg/m2  SpO2 96%   No pedal edema  Assessment:      Adrenal insufficiency:  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 and very low cortisol levels on the stimulation test Since she did not tolerate oral medications she is taking hydrocortisone twice a day Although she was feeling fairly good on her last visit 3 months ago she is now complaining of nausea, decreased appetite, weight loss even though she had been doing well with the previous dose  Family no other etiology of her symptoms found Her blood pressure is not low standing up and her sodium is normal Difficulty with certain whether her symptoms are related to adrenal insufficiency but empirically will have her increase hydrocortisone to 20 mg twice a day for 1 week and then reduced to evening dose back to 10 mg    Orthostatic dysautonomic hypotension syndrome:   Her blood pressure appears to be well controlled with  half tablet of Florinef per day Potassium has been normal and again high normal    Plan:      As above Follow-up in 4 weeks Follow-up with PCP if continued to lose weight She will also discussed nausea with GI consultant  [facility]

## 2015-03-18 ENCOUNTER — Other Ambulatory Visit: Payer: Self-pay | Admitting: Neurology

## 2015-03-18 ENCOUNTER — Telehealth: Payer: Self-pay | Admitting: *Deleted

## 2015-03-18 MED ORDER — FENTANYL 25 MCG/HR TD PT72: 25.0000 ug | MEDICATED_PATCH | TRANSDERMAL | Status: DC

## 2015-03-18 NOTE — Telephone Encounter (Signed)
Rx for Fentanyl placed up front for pick up. 

## 2015-03-18 NOTE — Telephone Encounter (Signed)
Patient is calling to get a written Rx for fentaNYL (DURAGESIC - DOSED MCG/HR) 25 MCG/HR patch. I advised the Rx will be ready in 24 hours unless the nurse advises otherwise.

## 2015-03-26 DIAGNOSIS — R110 Nausea: Secondary | ICD-10-CM | POA: Diagnosis not present

## 2015-03-26 DIAGNOSIS — K5900 Constipation, unspecified: Secondary | ICD-10-CM | POA: Diagnosis not present

## 2015-03-27 DIAGNOSIS — M1712 Unilateral primary osteoarthritis, left knee: Secondary | ICD-10-CM | POA: Diagnosis not present

## 2015-03-27 DIAGNOSIS — M810 Age-related osteoporosis without current pathological fracture: Secondary | ICD-10-CM | POA: Diagnosis not present

## 2015-04-10 ENCOUNTER — Other Ambulatory Visit: Payer: Self-pay | Admitting: *Deleted

## 2015-04-10 ENCOUNTER — Ambulatory Visit (INDEPENDENT_AMBULATORY_CARE_PROVIDER_SITE_OTHER): Payer: PPO | Admitting: Endocrinology

## 2015-04-10 VITALS — BP 120/62 | HR 75 | Resp 14 | Ht 61.0 in | Wt 109.2 lb

## 2015-04-10 DIAGNOSIS — E2740 Unspecified adrenocortical insufficiency: Secondary | ICD-10-CM | POA: Diagnosis not present

## 2015-04-10 LAB — BASIC METABOLIC PANEL
BUN: 16 mg/dL (ref 6–23)
CALCIUM: 9.7 mg/dL (ref 8.4–10.5)
CO2: 28 mEq/L (ref 19–32)
CREATININE: 0.95 mg/dL (ref 0.40–1.20)
Chloride: 102 mEq/L (ref 96–112)
GFR: 62.26 mL/min (ref >60.00)
Glucose, Bld: 94 mg/dL (ref 70–99)
Potassium: 4.4 mEq/L (ref 3.5–5.1)
Sodium: 137 mEq/L (ref 135–145)

## 2015-04-10 MED ORDER — HYDROCORTISONE NA SUCCINATE PF 100 MG IJ SOLR: INTRAMUSCULAR | Status: DC

## 2015-04-10 NOTE — Progress Notes (Signed)
Patient ID: [name], female   DOB: [birth year], 70 y.o.   MRN: [id number]   Subjective:         Chief complaint: Follow-up of adrenal insufficiency   PAST history: She has had long-standing problems with orthostatic hypotension and also hyponatremia. Has been diagnosed with dysautonomia and has multiple other problems related to autonomic neuropathy.   She has been on Florinef since 2004 previously taking 3 tablets daily along with 5 tablets of potassium which had previously controlled her symptoms well . Because of persistent orthostatic symptoms she had been tried on midodrine on 05/28/12 but this was later stopped when blood pressure increased.  She  had medication adjustments done frequently over the last year for regulating her blood pressure.  RECENT history:   Dysautonomic orthostatic hypotension  She has previously been on variable doses of Florinef, as much as 3 a day She now is taking progressively lower doses, 0.05 milligram daily now Her dose has been gradually reduced in the last few months  On her last visit  her blood pressure was relatively high she was told to try only half a tablet daily. Subsequently her blood pressure at home had been fairly normal consistently although checking very sporadically and has not done any readings for 2-3 weeks  She does not feel lightheaded unless she gets up quickly from setting up, does feel a little dizzy or off balance sometimes after getting up and walking    Hypokalemia:  She is now taking 2 tablets of potassium and potassium has been normal   Lab Results  Component Value Date   CREATININE 0.95 04/10/2015   BUN 16 04/10/2015   NA 137 04/10/2015   K 4.4 04/10/2015   CL 102 04/10/2015   CO2 28 04/10/2015     Adrenal Insufficiency:   This is secondary to pituitary dysfunction  Prior testing included Cortrosyn test baseline with stimulated level of 15.7, baseline 3.9.  Also confirmed by 24 hour urine  free cortisol which was only 3.  Although previously she had tolerated oral hydrocortisone and small doses with improvement in her symptoms she did not continue this long-term She was again symptomatic with weight loss, decreased appetite, nausea and also diarrhea Cortrosyn stimulation test on 03/12/14 showed baseline cortisol level of 0.3 and post injection of 1.1 only  She had GI side effects from hydrocortisone and prednisone and was given hydrocortisone injections using insulin syringe  Currently is on  hydrocortisone injections    RECENT HISTORY: Because of her losing weight progressively along with fatigue, nausea and decreased appetite her hydrocortisone was increased from the 10 mg dose in the morning to 20 mg. She could not tolerate the extra 10 mg in the evening because of insomnia  More recently she thinks she has been gradually eating feeling better with her energy level and is able to go out and do things. She is also eating more and gaining weight.  Nausea is generally better However also has been taking Remeron from PCP   Wt Readings from Last 3 Encounters:  04/10/15 109 lb 3.2 oz (49.533 kg)  03/13/15 104 lb 9.6 oz (47.446 kg)  12/11/14 113 lb 6.4 oz (51.438 kg)    General Endocrinology:  ?  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  Always use your most recent med list.               alfuzosin 10 MG 24 hr tablet  Commonly known as:  UROXATRAL  Take  10 mg by mouth daily with breakfast.     bifidobacterium infantis capsule  Take 1 capsule by mouth daily. Reported on 03/13/2015     calcium-vitamin D 500-200 MG-UNIT tablet  Commonly known as:  OSCAL WITH D  Take 1 tablet by mouth daily with breakfast.     docusate sodium 100 MG capsule  Commonly known as:  COLACE  Take 100 mg by mouth daily. Reported on 03/13/2015     fentaNYL 25 MCG/HR patch  Commonly known as:  DURAGESIC - dosed mcg/hr  Place 1 patch (25 mcg total) onto the skin every 3 (three) days.     Ferrous Sulfate Dried 200 (65 Fe) MG Tabs  Take 65 mg by mouth daily.     FLEXERIL 10 MG tablet  Generic drug:  cyclobenzaprine  Take 10 mg by mouth 3 (three) times daily as needed for muscle spasms. Reported on 03/13/2015     fludrocortisone 0.1 MG tablet  Commonly known as:  FLORINEF  One tablet in the morning and two tablets at night.     fluticasone 50 MCG/ACT nasal spray  Commonly known as:  FLONASE  Place 2 sprays into both nostrils at bedtime as needed for allergies or rhinitis.     hydrocortisone sodium succinate 100 MG Solr injection  Commonly known as:  SOLU-CORTEF  Inject 0.2cc's twice a day     INSULIN SYRINGE .5CC/31GX5/16" 31G X 5/16" 0.5 ML Misc  Use 2 per day to inject Hydrocortisone     mirtazapine 15 MG tablet  Commonly known as:  REMERON  Take 15 mg by mouth at bedtime.     REMERON PO  Take 15 mg by mouth 2 (two) times daily.     multivitamin with minerals Tabs tablet  Take 1 tablet by mouth daily.     NONFORMULARY OR COMPOUNDED ITEM  Meloxicam 0.5%, Gabapentin 6%, Lidocaine 2%, Prilocaine 2% Appt 1-2 grams to the affected area(s) 3-4 times daily     ondansetron 8 MG tablet  Commonly known as:  ZOFRAN  Take 8 mg by mouth 3 (three) times daily as needed for nausea or vomiting.     potassium chloride 10 MEQ tablet  Commonly known as:  K-DUR,KLOR-CON  Take 2 in the morning and two at bedtime.     promethazine 12.5 MG tablet  Commonly known  as:  PHENERGAN  Take 1 tablet (12.5 mg total) by mouth every 8 (eight) hours as needed for nausea or vomiting.     SYSTANE OP  Apply 2 drops to eye daily as needed (for dry eyes).     traZODone 150 MG tablet  Commonly known as:  DESYREL  Take 150 mg by mouth at bedtime.     trimethoprim 100 MG tablet  Commonly known as:  TRIMPEX  Take 100 mg by mouth  at bedtime.     UNABLE TO FIND  Med Name: Compound Cream: meloxicam 0.5%, doxepin 3%, amantadine 3%, dextromethorphan 2%, lidocaine 2% ([employer] [phone])     VITAMIN D3 SUPER STRENGTH 2000 units Caps  Generic drug:  Cholecalciferol  Take by mouth.        Review of Systems   NAUSEA: This has been a little better and takes Phenergan as needed  Her gastroenterologist does not feel it is related to an underlying GI cause  She continues to have  insomnia   Increased sweating:   Previously had significant sweating episodes but these are better  Genitourinary: She has had difficulty emptying her bladder and is being treated by urologist with various medications, Marcia Brash had been on Urecholine also     Objective:   Physical Exam  BP 120/62 mmHg  Pulse 75  Resp 14  Ht 5\' 1"  (1.549 m)  Wt 109 lb 3.2 oz (49.533 kg)  BMI 20.64 kg/m2  SpO2 96%  Blood pressure checked by myself showed normal readings with the 15 mm drop in systolic reading on standing up No pedal edema  Assessment:      Adrenal insufficiency:  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 and very low cortisol levels on the stimulation test Since she did not tolerate oral medications she is taking injectable hydrocortisone twice a day With increasing the dose to 20 mg in the morning she is subjectively feeling better and is tolerating this well  Will check her electrolytes again today and continue the same dose   Orthostatic dysautonomic hypotension syndrome:   Her blood pressure appears to be well  controlled with  half tablet of Florinef per day She has not been monitoring at home when encouraged her to do so Potassium will be rechecked   Plan:      As above Follow-up in 3 months  Katrina Rivera

## 2015-04-10 NOTE — Progress Notes (Signed)
Quick Note:  Please let patient know that the lab result is normal and no further action needed ______ 

## 2015-04-13 DIAGNOSIS — L237 Allergic contact dermatitis due to plants, except food: Secondary | ICD-10-CM | POA: Diagnosis not present

## 2015-04-16 DIAGNOSIS — F5101 Primary insomnia: Secondary | ICD-10-CM | POA: Diagnosis not present

## 2015-04-16 DIAGNOSIS — F419 Anxiety disorder, unspecified: Secondary | ICD-10-CM | POA: Diagnosis not present

## 2015-04-16 DIAGNOSIS — L247 Irritant contact dermatitis due to plants, except food: Secondary | ICD-10-CM | POA: Diagnosis not present

## 2015-04-24 ENCOUNTER — Other Ambulatory Visit: Payer: Self-pay | Admitting: Endocrinology

## 2015-04-30 ENCOUNTER — Ambulatory Visit (INDEPENDENT_AMBULATORY_CARE_PROVIDER_SITE_OTHER): Payer: PPO | Admitting: Ophthalmology

## 2015-05-04 DIAGNOSIS — Z1231 Encounter for screening mammogram for malignant neoplasm of breast: Secondary | ICD-10-CM | POA: Diagnosis not present

## 2015-05-04 DIAGNOSIS — Z01419 Encounter for gynecological examination (general) (routine) without abnormal findings: Secondary | ICD-10-CM | POA: Diagnosis not present

## 2015-05-07 ENCOUNTER — Ambulatory Visit (INDEPENDENT_AMBULATORY_CARE_PROVIDER_SITE_OTHER): Payer: PPO | Admitting: Ophthalmology

## 2015-05-21 DIAGNOSIS — F5101 Primary insomnia: Secondary | ICD-10-CM | POA: Diagnosis not present

## 2015-05-29 DIAGNOSIS — Z0000 Encounter for general adult medical examination without abnormal findings: Secondary | ICD-10-CM | POA: Diagnosis not present

## 2015-05-29 DIAGNOSIS — M1712 Unilateral primary osteoarthritis, left knee: Secondary | ICD-10-CM | POA: Diagnosis not present

## 2015-05-29 DIAGNOSIS — R3989 Other symptoms and signs involving the genitourinary system: Secondary | ICD-10-CM | POA: Diagnosis not present

## 2015-06-05 DIAGNOSIS — M1712 Unilateral primary osteoarthritis, left knee: Secondary | ICD-10-CM | POA: Diagnosis not present

## 2015-06-05 DIAGNOSIS — M47816 Spondylosis without myelopathy or radiculopathy, lumbar region: Secondary | ICD-10-CM | POA: Diagnosis not present

## 2015-06-12 DIAGNOSIS — M1712 Unilateral primary osteoarthritis, left knee: Secondary | ICD-10-CM | POA: Diagnosis not present

## 2015-06-15 ENCOUNTER — Telehealth: Payer: Self-pay | Admitting: Neurology

## 2015-06-15 MED ORDER — FENTANYL 25 MCG/HR TD PT72: 25.0000 ug | MEDICATED_PATCH | TRANSDERMAL | Status: DC

## 2015-06-15 NOTE — Telephone Encounter (Signed)
Patient requesting refill of fentaNYL (DURAGESIC - DOSED MCG/HR) 25 MCG/HR patch 90 day supply.

## 2015-06-15 NOTE — Telephone Encounter (Addendum)
Ok, per vo by Dr. Terrace Arabia, to refill rx for 30 days.  Printed, signed and placed up front for pick up.

## 2015-06-22 DIAGNOSIS — R030 Elevated blood-pressure reading, without diagnosis of hypertension: Secondary | ICD-10-CM | POA: Diagnosis not present

## 2015-06-22 DIAGNOSIS — R262 Difficulty in walking, not elsewhere classified: Secondary | ICD-10-CM | POA: Diagnosis not present

## 2015-06-22 DIAGNOSIS — M5417 Radiculopathy, lumbosacral region: Secondary | ICD-10-CM | POA: Diagnosis not present

## 2015-06-22 DIAGNOSIS — M6281 Muscle weakness (generalized): Secondary | ICD-10-CM | POA: Diagnosis not present

## 2015-06-23 ENCOUNTER — Other Ambulatory Visit: Payer: Self-pay | Admitting: *Deleted

## 2015-06-23 ENCOUNTER — Telehealth: Payer: Self-pay | Admitting: Neurology

## 2015-06-23 ENCOUNTER — Encounter: Payer: Self-pay | Admitting: *Deleted

## 2015-06-23 ENCOUNTER — Telehealth: Payer: Self-pay | Admitting: Endocrinology

## 2015-06-23 MED ORDER — HYDROCORTISONE 20 MG PO TABS: ORAL_TABLET | ORAL | Status: DC

## 2015-06-23 NOTE — Telephone Encounter (Signed)
Noted, she is aware, she has an appointment with you at the end of the month, she's going to finish up the vials she has and then start on the pills.

## 2015-06-23 NOTE — Telephone Encounter (Signed)
Pt doesn't think the fentaNYL (DURAGESIC - DOSED MCG/HR) 25 MCG/HR patch is helping at this time. She is wanting to taper off. Please call

## 2015-06-23 NOTE — Telephone Encounter (Signed)
°  She is wanting to know if she could get changed back to the pills instead of the inj  The inj are getting to her.

## 2015-06-23 NOTE — Telephone Encounter (Signed)
Please see below and advise.

## 2015-06-23 NOTE — Telephone Encounter (Signed)
Hydrocortisone 20 mg with breakfast or lunch and 10 mg in evening, may use 20 mg tablets

## 2015-06-23 NOTE — Telephone Encounter (Signed)
Spoke to patient - she has taken her Fentanyl patch off today and would like to see how her pain does without this medication.  Dr. Terrace Arabia has been made aware of her decision.  Instructed her to call back with any concerns.

## 2015-06-25 ENCOUNTER — Encounter: Payer: Self-pay | Admitting: *Deleted

## 2015-06-25 ENCOUNTER — Telehealth: Payer: Self-pay | Admitting: *Deleted

## 2015-06-25 NOTE — Telephone Encounter (Signed)
at  8:07AM by JWT ------------------------------------------------------------ Larwance Rote             CID [id number]  Patient Katrina Rivera                 Pt's Dr Terrace Arabia          Area Code 336 Phone# [phone] * DOB [phone]     RE MICHELLE IN OFFICE-TO RETURN CALL-IT IS ABOUT     HER PAIN PATCH                                       Disp:Y/N N If Y = C/B If No Response In ===========

## 2015-06-25 NOTE — Telephone Encounter (Signed)
Duplicate message- see other encounter.

## 2015-06-25 NOTE — Telephone Encounter (Signed)
Spoke to [place] - states her pain was becoming intolerable just two days off her Fentanyl patch and she is going to resume use of this medication today.

## 2015-06-25 NOTE — Telephone Encounter (Signed)
Pt called said she needs to go back on the patch, she is having pain she did not realize that she didn't know she had. She was awake most of the night. Please call ([phone]

## 2015-06-29 ENCOUNTER — Telehealth: Payer: Self-pay | Admitting: Neurology

## 2015-06-29 DIAGNOSIS — R269 Unspecified abnormalities of gait and mobility: Secondary | ICD-10-CM

## 2015-06-29 DIAGNOSIS — Z9889 Other specified postprocedural states: Secondary | ICD-10-CM

## 2015-06-29 DIAGNOSIS — G8929 Other chronic pain: Secondary | ICD-10-CM | POA: Insufficient documentation

## 2015-06-29 DIAGNOSIS — M6281 Muscle weakness (generalized): Secondary | ICD-10-CM | POA: Diagnosis not present

## 2015-06-29 DIAGNOSIS — M79606 Pain in leg, unspecified: Secondary | ICD-10-CM

## 2015-06-29 DIAGNOSIS — R262 Difficulty in walking, not elsewhere classified: Secondary | ICD-10-CM | POA: Diagnosis not present

## 2015-06-29 MED ORDER — FENTANYL 50 MCG/HR TD PT72: 50.0000 ug | MEDICATED_PATCH | TRANSDERMAL | Status: DC

## 2015-06-29 NOTE — Telephone Encounter (Addendum)
Please let patient know, we can refill her fentanyl patch this time ( q 3 days x 30=90 days supply), I have referred her to pain management for long term fentanyl patch and pain management

## 2015-06-29 NOTE — Telephone Encounter (Signed)
Patient is calling as she has been taking Rx fentaNYL 25 mg and feels she needs to up to 50 mg dosage.  Thanks!

## 2015-06-29 NOTE — Addendum Note (Signed)
Addended by: Levert Feinstein on: 06/29/2015 01:55 PM   Modules accepted: Orders

## 2015-06-29 NOTE — Telephone Encounter (Signed)
Patient stopped using her Fentanyl patches on 06/23/15 because she felt the medication was not helpful (still having pain).  Her pain worsened and she restarted Fentanyl 25 mcg patches on 06/25/15.  Her pain has improved but she feels 25 mcg is not strong enough and she is requesting 50 mcg patches.  She would also like a 90-day prescription because it is so difficult for her to get to our office.  States we have accommodated this request in the past and she has never caused a problem with refills.

## 2015-06-29 NOTE — Telephone Encounter (Signed)
Spoke to [place] - her prescription is ready for pick up.  She is agreeable to the pain management referral and is aware to expect a call.

## 2015-07-01 ENCOUNTER — Other Ambulatory Visit: Payer: Self-pay | Admitting: Neurological Surgery

## 2015-07-01 DIAGNOSIS — M5417 Radiculopathy, lumbosacral region: Secondary | ICD-10-CM

## 2015-07-02 ENCOUNTER — Ambulatory Visit
Admission: RE | Admit: 2015-07-02 | Discharge: 2015-07-02 | Disposition: A | Discharge status: Home / Self Care | Payer: PPO | Source: Ambulatory Visit | Attending: Neurological Surgery | Admitting: Neurological Surgery

## 2015-07-02 ENCOUNTER — Telehealth: Payer: Self-pay | Admitting: *Deleted

## 2015-07-02 ENCOUNTER — Telehealth: Payer: Self-pay | Admitting: Endocrinology

## 2015-07-02 DIAGNOSIS — M5417 Radiculopathy, lumbosacral region: Secondary | ICD-10-CM

## 2015-07-02 DIAGNOSIS — M4806 Spinal stenosis, lumbar region: Secondary | ICD-10-CM | POA: Diagnosis not present

## 2015-07-02 NOTE — Telephone Encounter (Signed)
She has tried everything else before.  At the most she can try prednisone 5 mg in the morning and 2. 5 in the evening otherwise go back to the injections

## 2015-07-02 NOTE — Telephone Encounter (Signed)
Pt takes the cortisol inj and the high cortisone pill is making her terribly sick

## 2015-07-02 NOTE — Telephone Encounter (Signed)
Noted, she will go back to the injections tomorrow.

## 2015-07-02 NOTE — Telephone Encounter (Signed)
Patient called, she said since she's been taking the cortisone 20 mg, she has been very sick, nauseous, dizzy and very tired. She wants to know if there is something else out there that she can take that's a lower dose pill?  Please advise.

## 2015-07-07 ENCOUNTER — Other Ambulatory Visit: Payer: Self-pay | Admitting: *Deleted

## 2015-07-07 ENCOUNTER — Telehealth: Payer: Self-pay | Admitting: Endocrinology

## 2015-07-07 MED ORDER — POTASSIUM CHLORIDE CRYS ER 10 MEQ PO TBCR: EXTENDED_RELEASE_TABLET | ORAL | Status: DC

## 2015-07-07 MED ORDER — FLUDROCORTISONE ACETATE 0.1 MG PO TABS: ORAL_TABLET | ORAL | Status: DC

## 2015-07-07 NOTE — Telephone Encounter (Signed)
rxs sent

## 2015-07-07 NOTE — Telephone Encounter (Signed)
Patient need refill of potassium chloride (K-DUR,KLOR-CON) 10 MEQ tablet (a coated pill so she can swallow),  And  fludrocortisone (FLORINEF) 0.1 MG tablet send to  [facility] - [place], [place] - [address] [phone] (Phone) [phone] (Fax)       Please call her she asked

## 2015-07-08 ENCOUNTER — Other Ambulatory Visit (INDEPENDENT_AMBULATORY_CARE_PROVIDER_SITE_OTHER): Payer: PPO

## 2015-07-08 DIAGNOSIS — E2740 Unspecified adrenocortical insufficiency: Secondary | ICD-10-CM | POA: Diagnosis not present

## 2015-07-08 LAB — COMPREHENSIVE METABOLIC PANEL
ALBUMIN: 4.2 g/dL (ref 3.5–5.2)
ALT: 11 U/L (ref 0–35)
AST: 16 U/L (ref 0–37)
Alkaline Phosphatase: 28 U/L — ABNORMAL LOW (ref 39–117)
BILIRUBIN TOTAL: 0.3 mg/dL (ref 0.2–1.2)
BUN: 21 mg/dL (ref 6–23)
CALCIUM: 9.3 mg/dL (ref 8.4–10.5)
CHLORIDE: 100 meq/L (ref 96–112)
CO2: 29 mEq/L (ref 19–32)
CREATININE: 1.06 mg/dL (ref 0.40–1.20)
GFR: 54.83 mL/min — AB (ref >60.00)
Glucose, Bld: 89 mg/dL (ref 70–99)
POTASSIUM: 4 meq/L (ref 3.5–5.1)
Sodium: 135 mEq/L (ref 135–145)
TOTAL PROTEIN: 6.3 g/dL (ref 6.0–8.3)

## 2015-07-13 ENCOUNTER — Other Ambulatory Visit: Payer: Self-pay | Admitting: *Deleted

## 2015-07-13 ENCOUNTER — Encounter: Payer: Self-pay | Admitting: *Deleted

## 2015-07-13 ENCOUNTER — Ambulatory Visit (INDEPENDENT_AMBULATORY_CARE_PROVIDER_SITE_OTHER): Payer: PPO | Admitting: Endocrinology

## 2015-07-13 ENCOUNTER — Telehealth: Payer: Self-pay | Admitting: Endocrinology

## 2015-07-13 ENCOUNTER — Encounter: Payer: Self-pay | Admitting: Endocrinology

## 2015-07-13 VITALS — BP 136/77 | HR 85 | Temp 98.0°F | Resp 14 | Ht 61.0 in | Wt 108.0 lb

## 2015-07-13 DIAGNOSIS — R110 Nausea: Secondary | ICD-10-CM | POA: Diagnosis not present

## 2015-07-13 DIAGNOSIS — G903 Multi-system degeneration of the autonomic nervous system: Secondary | ICD-10-CM | POA: Diagnosis not present

## 2015-07-13 DIAGNOSIS — I951 Orthostatic hypotension: Secondary | ICD-10-CM

## 2015-07-13 DIAGNOSIS — E2749 Other adrenocortical insufficiency: Secondary | ICD-10-CM

## 2015-07-13 DIAGNOSIS — R5383 Other fatigue: Secondary | ICD-10-CM | POA: Diagnosis not present

## 2015-07-13 MED ORDER — METOCLOPRAMIDE HCL 5 MG/5ML PO SOLN: 5.0000 mg | Freq: Three times a day (TID) | ORAL | Status: DC

## 2015-07-13 NOTE — Progress Notes (Signed)
Patient ID: Katrina Rivera, female   DOB: [birth year], 70 y.o.   MRN: [id number]   Subjective:         Chief complaint: Nause  Follow-up of adrenal insufficiency   PAST history: She has had long-standing problems with orthostatic hypotension and also hyponatremia. Has been diagnosed with dysautonomia and has multiple other problems related to autonomic neuropathy.   She has been on Florinef since 2004 previously taking 3 tablets daily along with 5 tablets of potassium which had previously controlled her symptoms well . Because of persistent orthostatic symptoms she had been tried on midodrine on 05/28/12 but this was later stopped when blood pressure increased.  She  had medication adjustments done frequently over the last year for regulating her blood pressure.  RECENT history:    Dysautonomic orthostatic hypotension  She has previously been on variable doses of Florinef, as much as 3 a day She is more recently taking progressively lower doses, now taking a half of the 0.1 mg tablet daily  In the morning   She has checked her blood pressure periodically but not consistently. She thinks blood pressure may have been higher with her getting steroid supplements for various reasons but usually not more than 140/70  She does not feel lightheaded unless she gets up quickly from sitting     Hypokalemia:  She is now taking 2 tablets of potassium and potassium has been normal , usually trying to take these at bedtime   Lab Results  Component Value Date   CREATININE 1.06 07/08/2015   BUN 21 07/08/2015   NA 135 07/08/2015   K 4.0 07/08/2015   CL 100 07/08/2015   CO2 29 07/08/2015     Adrenal Insufficiency:   This is secondary to pituitary dysfunction  Prior testing included Cortrosyn test showing stimulated level of 15.7, baseline 3.9.  Also confirmed by 24 hour urine free cortisol which was only 3.  Although previously she had tolerated oral hydrocortisone and small  doses with improvement in her symptoms she did not continue this long-term She was again symptomatic with weight loss, decreased appetite, nausea and also diarrhea Cortrosyn stimulation test on 03/12/14 showed baseline cortisol level of 0.3 and post injection of 1.1 only  She had GI side effects from hydrocortisone , methylprednisolone and prednisone and was given hydrocortisone injections using insulin syringe  Currently is on  hydrocortisone injections    RECENT HISTORY:  Last month she will again wanting to try and switch to oral medications because of bruising at the injection sites but had abdominal discomfort and nausea with hydrocortisone  She did initially feel better when her dose was increased to 20 mg hydrocortisone on her last visit  She is again complaining of feeling tired , no energy level and persistent nausea and not being able to eat much  However her weight is about the same   Wt Readings from Last 3 Encounters:  07/13/15 108 lb (48.988 kg)  04/10/15 109 lb 3.2 oz (49.533 kg)  03/13/15 104 lb 9.6 oz (47.446 kg)    General Endocrinology:  ?  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  Always use your most recent med list.               alfuzosin 10 MG 24 hr tablet  Commonly known as:  UROXATRAL  Take 10 mg by mouth daily with breakfast.     bifidobacterium infantis capsule  Take 1 capsule by mouth daily. Reported on 03/13/2015     calcium-vitamin D  500-200 MG-UNIT tablet  Commonly known as:  OSCAL WITH D  Take 1 tablet by mouth daily with breakfast.     docusate sodium 100 MG capsule  Commonly known as:  COLACE  Take 100 mg by mouth daily. Reported on 07/13/2015     fentaNYL 50 MCG/HR  Commonly known as:  DURAGESIC - dosed mcg/hr  Place 1 patch (50 mcg total) onto the skin every 3 (three) days.     Ferrous Sulfate Dried 200 (65 Fe) MG Tabs  Take 65 mg by mouth daily.     FLEXERIL 10 MG tablet  Generic drug:  cyclobenzaprine  Take 10 mg by mouth 3 (three) times daily as needed for muscle spasms. Reported on 03/13/2015     fludrocortisone 0.1 MG tablet  Commonly known as:  FLORINEF  One tablet in the morning and two tablets at night.     fluticasone 50 MCG/ACT nasal spray  Commonly known as:  FLONASE  Place 2 sprays into both nostrils at bedtime as needed for allergies or rhinitis.     hydrocortisone 20 MG tablet  Commonly known as:  CORTEF  Take 1 tablet at breakfast or lunch and 1/2 tablet at dinner.     hydrocortisone sodium succinate 100 MG Solr injection  Commonly known as:  SOLU-CORTEF  Inject 0.2cc's twice a day     INSULIN SYRINGE .5CC/31GX5/16" 31G X 5/16" 0.5 ML Misc  Use 2 per day to inject Hydrocortisone     BD INSULIN SYRINGE ULTRAFINE 31G X 15/64" 0.5 ML Misc  Generic drug:  Insulin Syringe-Needle U-100  USE TWICE DAILY.     mirtazapine 15 MG tablet  Commonly known as:  REMERON  Take 15 mg by mouth at bedtime. Reported on 07/13/2015     REMERON PO  Take 15 mg by mouth 2 (two) times daily. Reported on 07/13/2015     multivitamin with minerals Tabs tablet  Take 1 tablet by mouth daily.     NONFORMULARY OR COMPOUNDED ITEM  Meloxicam 0.5%, Gabapentin 6%, Lidocaine 2%, Prilocaine 2% Appt 1-2 grams to the affected area(s) 3-4 times daily     ondansetron 8 MG tablet  Commonly known as:  ZOFRAN  Take 8 mg by mouth 3 (three) times daily as needed for nausea or vomiting.     potassium chloride 10 MEQ tablet    Commonly known as:  K-DUR,KLOR-CON  Take 2 in the morning and two at bedtime.     promethazine 12.5 MG tablet  Commonly known as:  PHENERGAN  Take 1 tablet (12.5 mg total) by mouth every 8 (eight) hours as needed for nausea or vomiting.     SYSTANE OP  Apply 2 drops to eye daily as needed (for dry eyes).     traZODone 150 MG tablet  Commonly known as:  DESYREL  Take 150 mg by mouth at bedtime.     trimethoprim 100 MG tablet  Commonly known as:  TRIMPEX  Take 100 mg by mouth at bedtime.     UNABLE TO FIND  Med Name: Compound Cream: meloxicam 0.5%, doxepin 3%, amantadine 3%, dextromethorphan 2%, lidocaine 2% ([employer] [phone])     VITAMIN D3 SUPER STRENGTH 2000 units Caps  Generic drug:  Cholecalciferol  Take by mouth.        Review of Systems   NAUSEA: This has been much worse and is taking Zofran as needed   Also she says she is not able to eat much and gets full easily. Her gastroenterologist does not feel it is related to an underlying GI cause  She continues to have  Insomnia , somewhat better with the Remeron  Increased sweating:  Previously had significant sweating episodes but these are better  Genitourinary: She has had difficulty emptying her bladder and is being treated by urologist with various medications, Marcia Brash had been on Urecholine also     Objective:   Physical Exam  BP 136/77 mmHg  Pulse 85  Temp(Src) 98 F (36.7 C)  Resp 14  Ht  (1.549 m)  Wt 108 lb (48.988 kg)  BMI 20.42 kg/m2  SpO2 96%  Blood pressure checked again showed sitting blood pressure 140/ 80 and standing 136/ 70 to   No pedal edema  Assessment:      Adrenal insufficiency:  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 and very low cortisol levels on the stimulation test Since she did not tolerate oral medications she is taking injectable hydrocortisone twice a day  With her difficulty using the injection she has  tried oral supplements again without being able to tolerate them , has GI side effects   With increasing the dose to 20 mg in the morning she previously felt better but does complaining of feeling tired.  Most likely her fatigue is unrelated to her adrenal insufficiency    Orthostatic dysautonomic hypotension syndrome:   Her blood pressure appears to be well controlled with  half tablet of Florinef 0.1 mg per day She has not checked her blood pressure standing up regularly and advised her to do so    NAUSEA and decreased appetite: etiology is unclear. This is a likely to be related to adrenal insufficiency as she is getting therapeutic doses of hydrocortisone    Apparently no GI cause has been followed previously but she has not gone back for follow-up  She also has had some extra doses of Medrol and other steroids for various illnesses  Not clear if she may getting some side effects of her potassium tablets also  Plan:        trial of Reglan syrup 5 mg before each meal , discussed benefits, actions and possible side effects   temporarily increase Prilosec to twice a day  May try to take only one potassium daily and take this with a meal   No change in Florinef or hydrocortisone    her husband can continue to rotate the sites of injections   follow-up with PCP if not improved and also consider follow-up with gastroenterologist   total visit time reviewing her multiple problems , counseling = 25 minute  Cybill Uriegas

## 2015-07-13 NOTE — Telephone Encounter (Signed)
Pt is metoclopramide liquid 5 mg

## 2015-07-13 NOTE — Patient Instructions (Addendum)
Prilosec 2x daily  1 Potassium daily  Take Reglan 30 min before meals

## 2015-07-19 ENCOUNTER — Other Ambulatory Visit: Payer: Self-pay | Admitting: Endocrinology

## 2015-07-19 MED ORDER — METOCLOPRAMIDE HCL 5 MG PO TABS: 5.0000 mg | ORAL_TABLET | Freq: Three times a day (TID) | ORAL | Status: DC

## 2015-07-20 ENCOUNTER — Telehealth: Payer: Self-pay | Admitting: Endocrinology

## 2015-07-20 MED ORDER — METOCLOPRAMIDE HCL 5 MG PO TABS: 5.0000 mg | ORAL_TABLET | Freq: Three times a day (TID) | ORAL | Status: DC

## 2015-07-20 NOTE — Telephone Encounter (Signed)
Pt calling for the metoclopramide 5 mg tablets please called into [facility]  Also call in fludrocortisone can she take the whole pill 1 mg there is no way to break it in half

## 2015-07-20 NOTE — Telephone Encounter (Signed)
Metoclopramide has been called in yesterday. She will have to take the fludrocortisone every other day as the whole tablet once a day will be too much, she can ask the pharmacist to help break them

## 2015-07-20 NOTE — Telephone Encounter (Signed)
I contacted the pt and advised of instructions. Pt stated she would take 1 tab ever other day. Pt stated she had asked the pharmacist to help her but was still unable to cut the pills in half successfully.

## 2015-07-20 NOTE — Telephone Encounter (Signed)
See note below about the dosage of the fludrocortisone medication.

## 2015-07-21 DIAGNOSIS — M4806 Spinal stenosis, lumbar region: Secondary | ICD-10-CM | POA: Diagnosis not present

## 2015-07-21 DIAGNOSIS — F329 Major depressive disorder, single episode, unspecified: Secondary | ICD-10-CM | POA: Diagnosis not present

## 2015-07-21 DIAGNOSIS — F419 Anxiety disorder, unspecified: Secondary | ICD-10-CM | POA: Diagnosis not present

## 2015-07-21 DIAGNOSIS — M5417 Radiculopathy, lumbosacral region: Secondary | ICD-10-CM | POA: Diagnosis not present

## 2015-08-04 DIAGNOSIS — R634 Abnormal weight loss: Secondary | ICD-10-CM | POA: Diagnosis not present

## 2015-08-04 DIAGNOSIS — R110 Nausea: Secondary | ICD-10-CM | POA: Diagnosis not present

## 2015-08-04 DIAGNOSIS — K5900 Constipation, unspecified: Secondary | ICD-10-CM | POA: Diagnosis not present

## 2015-08-06 ENCOUNTER — Ambulatory Visit
Admission: RE | Admit: 2015-08-06 | Discharge: 2015-08-06 | Disposition: A | Discharge status: Home / Self Care | Payer: PPO | Source: Ambulatory Visit | Attending: Gastroenterology | Admitting: Gastroenterology

## 2015-08-06 ENCOUNTER — Other Ambulatory Visit: Payer: Self-pay | Admitting: Gastroenterology

## 2015-08-06 ENCOUNTER — Telehealth: Payer: Self-pay

## 2015-08-06 ENCOUNTER — Telehealth: Payer: Self-pay | Admitting: Endocrinology

## 2015-08-06 DIAGNOSIS — K5900 Constipation, unspecified: Secondary | ICD-10-CM

## 2015-08-06 DIAGNOSIS — R110 Nausea: Secondary | ICD-10-CM

## 2015-08-06 NOTE — Telephone Encounter (Signed)
The patient reported doses are correct

## 2015-08-06 NOTE — Telephone Encounter (Signed)
Patient requesting refill for solu-cortef  vial. The directions say to inject 0.2 ml twice a day. Patient is stating they take 0.39ml in the a.m, and 02.ml in the p.m. Please verify directions so I can order correctly. Thank you.

## 2015-08-06 NOTE — Telephone Encounter (Signed)
We need to call in solu-cortef to [facility] please

## 2015-08-07 ENCOUNTER — Other Ambulatory Visit: Payer: Self-pay

## 2015-08-07 MED ORDER — HYDROCORTISONE NA SUCCINATE PF 100 MG IJ SOLR: INTRAMUSCULAR | Status: DC

## 2015-08-07 NOTE — Telephone Encounter (Signed)
Rx submitted

## 2015-08-07 NOTE — Addendum Note (Signed)
Addended by: Ann Maki T on: 08/07/2015 07:57 AM   Modules accepted: Orders

## 2015-08-10 ENCOUNTER — Emergency Department (HOSPITAL_COMMUNITY)
Admission: EM | Admit: 2015-08-10 | Discharge: 2015-08-10 | Disposition: A | Discharge status: Home / Self Care | Payer: PPO | Attending: Emergency Medicine | Admitting: Emergency Medicine

## 2015-08-10 ENCOUNTER — Encounter (HOSPITAL_COMMUNITY): Payer: Self-pay

## 2015-08-10 DIAGNOSIS — F329 Major depressive disorder, single episode, unspecified: Secondary | ICD-10-CM | POA: Insufficient documentation

## 2015-08-10 DIAGNOSIS — K5641 Fecal impaction: Secondary | ICD-10-CM | POA: Insufficient documentation

## 2015-08-10 MED ORDER — LIDOCAINE VISCOUS 2 % MT SOLN
15.0000 mL | Freq: Once | OROMUCOSAL | Status: AC
  Administered 2015-08-10: 15 mL via OROMUCOSAL
  Filled 2015-08-10: qty 15

## 2015-08-10 MED ORDER — MILK AND MOLASSES ENEMA
1.0000 | Freq: Once | RECTAL | Status: AC
Start: 2015-08-10 — End: 2015-08-10
  Administered 2015-08-10: 250 mL via RECTAL
  Filled 2015-08-10: qty 250

## 2015-08-10 NOTE — ED Notes (Signed)
Patient sent from Dr. Ewing Schlein for fecal impaction. Had xray on Thursday showing all the stool, taking miralax with no relief, has minimal colon per patient

## 2015-08-10 NOTE — Discharge Instructions (Signed)
Try Fleet enemas at home. Continue daily miralax. Drink plenty of fluids. Follow up as needed.    Fecal Impaction A fecal impaction happens when there is a large, firm amount of stool (or feces) that cannot be passed. The impacted stool is usually in the rectum, which is the lowest part of the large bowel. The impacted stool can block the colon and cause significant problems. CAUSES  The longer stool stays in the rectum, the harder it gets. Anything that slows down your bowel movements can lead to fecal impaction, such as:  Constipation. This can be a long-standing (chronic) problem or can happen suddenly (acute).  Painful conditions of the rectum, such as hemorrhoids or anal fissures. The pain of these conditions can make you try to avoid having bowel movements.  Narcotic pain-relieving medicines, such as methadone, morphine, or codeine.  Not drinking enough fluids.  Inactivity and bed rest over long periods of time.  Diseases of the brain or nervous system that damage the nerves controlling the muscles of the intestines. SIGNS AND SYMPTOMS   Lack of normal bowel movements or changes in bowel patterns.  Sense of fullness in the rectum but unable to pass stool.  Pain or cramps in the abdominal area (often after meals).  Thin, watery discharge from the rectum. DIAGNOSIS  Your health care provider may suspect that you have a fecal impaction based on your symptoms and a physical exam. This will include an exam of your rectum. Sometimes X-rays or lab testing may be needed to confirm the diagnosis and to be sure there are no other problems.  TREATMENT   Initially an impaction can be removed manually. Using a gloved finger, your health care provider can remove hard stool from your rectum.  Medicine is sometimes needed. A suppository or enema can be given in the rectum to soften the stool, which can stimulate a bowel movement. Medicines can also be given by mouth (orally).  Though rare,  surgery may be needed if the colon has torn (perforated) due to blockage. HOME CARE INSTRUCTIONS   Develop regular bowel habits. This could include getting in the habit of having a bowel movement after your morning cup of coffee or after eating. Be sure to allow yourself enough time on the toilet.  Maintain a high-fiber diet.  Drink enough fluids to keep your urine clear or pale yellow as directed by your health care provider.  Exercise regularly.  If you begin to get constipated, increase the amount of fiber in your diet. Eat plenty of fruits, vegetables, whole wheat breads, bran, oatmeal, and similar products.  Take natural fiber laxatives or other laxatives only as directed by your health care provider. SEEK MEDICAL CARE IF:   You have ongoing rectal pain.  You require enemas or suppositories more than twice a week.  You have rectal bleeding.  You have continued problems, or you develop abdominal pain.  You have thin, pencil-like stools. SEEK IMMEDIATE MEDICAL CARE IF:  You have black or tarry stools. MAKE SURE YOU:   Understand these instructions.  Will watch your condition.  Will get help right away if you are not doing well or get worse.   This information is not intended to replace advice given to you by your health care provider. Make sure you discuss any questions you have with your health care provider.   Document Released: 10/17/2003 Document Revised: 11/14/2012 Document Reviewed: 07/31/2012 Elsevier Interactive Patient Education [employer]

## 2015-08-10 NOTE — ED Notes (Signed)
Family at bedside. 

## 2015-08-10 NOTE — ED Notes (Signed)
Updated pt. On plan of care and information on the milk of molasses enema.  She Verbalized understanding.

## 2015-08-10 NOTE — ED Notes (Signed)
Pt. Ambulated to the front of the ED with her husband, Gait steady.  Pt.'s husband will be driving her home

## 2015-08-10 NOTE — ED Provider Notes (Signed)
CSN: [id number]     Arrival date & time 08/10/15  1013 History   First MD Initiated Contact with Patient 08/10/15 1155     Chief Complaint  Patient presents with  . Fecal Impaction     (Consider location/radiation/quality/duration/timing/severity/associated sxs/prior Treatment) HPI Katrina Rivera is a 70 y.o. female with history of multiple sclerosis, fibromyalgia, GERD, irritable bowel syndrome, constipation, presents to emergency department complaining of fecal impaction. Patient states that she has had constipation problems for the last several years. She states she sees Dr. Darlen Round, who told her that her bowels are not working due to her MS. She is taking Colace, MiraLAX, fiber supplements, drinks plenty of fluids, states still unable to have normal bowel movements. She saw him 4 days ago for constipation, x-ray of abdomen was obtained, and she was told she had fecal impaction. Patient tried to increase her MiraLAX over the weekend, drinking as much as 1 Every 1 hour. She states she still unable to have any bowel movement. She states Dr. Darlen Round told her to come here for evaluation. Pt states she has had some nausea, denies vomiting. No diarrhea. No fever, chills, malaise. States she is having rectal spasms. No abdominal pain.   Past Medical History  Diagnosis Date  . Fibromyalgia   . Restless leg syndrome   . Multiple sclerosis (HCC)     doesn't take any meds  . Arthritis   . Osteoporosis   . Hypotension   . Headache(784.0)   . GERD (gastroesophageal reflux disease)   . Anxiety     takes Xanax nightly  . IBS (irritable bowel syndrome)     takes Librarian, academic daily  . Seasonal allergies     takes Zyrtec daily;uses Flonase daily as needed  . Hypotension     takes Florinef daily  . History of bronchitis     many yrs ago   . Dizziness     if b/p drops   . Peripheral neuropathy (HCC)   . Weakness     numbness and tingling  . Joint pain   . Chronic back pain     stenosis  . Urinary  frequency     takes Flomax daily  . Nocturia   . Anemia     takes Ferrous Sulfate daily  . History of blood transfusion     no abnormal reaction noted  . Addison's disease (HCC)     takes Solu Cortef daily  . Hypokalemia     takes Potassium daily  . Depression     takes Cymbalta daily  . Insomnia     takes Trazodone nightly  . Syncope   . Multiple sclerosis (HCC)   . Fibromyalgia    Past Surgical History  Procedure Laterality Date  . Cesarean section  1973/1977    x2  . Eye surgery      bilateral - /w IOL  . Lumbar laminectomy/decompression microdiscectomy Left 01/24/2013    Procedure: LUMBAR FIVE TO SACRAL ONE LUMBAR LAMINECTOMY/DECOMPRESSION MICRODISCECTOMY 1 LEVEL;  Surgeon: Tia Alert, MD;  Location: [facility];  Service: Neurosurgery;  Laterality: Left;  . Fracture surgery Right     rods and screws  . Cholecystectomy  1997  . Abdominal hysterectomy  1988  . Appendectomy  1988  . Colectomy  1990  . Colonoscopy    . Esophagogastroduodenoscopy    . Maximum access (mas)posterior lumbar interbody fusion (plif) 1 level N/A 06/18/2014    Procedure: MAXIMUM ACCESS SURGERY POSTERIOR LUMBAR INTERBODY FUSION  LUMBAR FIVE TO SACRAL ONE ;  Surgeon: Tia Alert, MD;  Location: [facility];  Service: Neurosurgery;  Laterality: N/A;   Family History  Problem Relation Age of Onset  . Hypertension Mother   . Stroke Mother   . Heart attack Father    Social History  Substance Use Topics  . Smoking status: Never Smoker   . Smokeless tobacco: Never Used  . Alcohol Use: No   OB History    No data available     Review of Systems  Constitutional: Negative for fever and chills.  Respiratory: Negative for cough, chest tightness and shortness of breath.   Cardiovascular: Negative for chest pain, palpitations and leg swelling.  Gastrointestinal: Positive for constipation and rectal pain. Negative for nausea, vomiting, abdominal pain and diarrhea.  Genitourinary: Negative for  dysuria, flank pain and pelvic pain.  Musculoskeletal: Negative for myalgias, arthralgias, neck pain and neck stiffness.  Skin: Negative for rash.  Neurological: Negative for dizziness, weakness and headaches.  All other systems reviewed and are negative.     Allergies  Review of patient's allergies indicates no known allergies.  Home Medications   Prior to Admission medications   Medication Sig Start Date End Date Taking? Authorizing Provider  alfuzosin (UROXATRAL) 10 MG 24 hr tablet Take 10 mg by mouth daily with breakfast.    Historical Provider, MD  BD INSULIN SYRINGE ULTRAFINE 31G X 15/64" 0.5 ML MISC USE TWICE DAILY. 04/24/15   Reather Littler, MD  bifidobacterium infantis (ALIGN) capsule Take 1 capsule by mouth daily. Reported on 03/13/2015    Historical Provider, MD  calcium-vitamin D (OSCAL WITH D) 500-200 MG-UNIT per tablet Take 1 tablet by mouth daily with breakfast.     Historical Provider, MD  Cholecalciferol (VITAMIN D3 SUPER STRENGTH) 2000 units CAPS Take by mouth.    Historical Provider, MD  cyclobenzaprine (FLEXERIL) 10 MG tablet Take 10 mg by mouth 3 (three) times daily as needed for muscle spasms. Reported on 03/13/2015    Historical Provider, MD  docusate sodium (COLACE) 100 MG capsule Take 100 mg by mouth daily. Reported on 07/13/2015    Historical Provider, MD  fentaNYL (DURAGESIC - DOSED MCG/HR) 50 MCG/HR Place 1 patch (50 mcg total) onto the skin every 3 (three) days. 06/29/15   Levert Feinstein, MD  Ferrous Sulfate Dried 200 (65 FE) MG TABS Take 65 mg by mouth daily.    Historical Provider, MD  fludrocortisone (FLORINEF) 0.1 MG tablet One tablet in the morning and two tablets at night. 07/07/15   Reather Littler, MD  fluticasone (FLONASE) 50 MCG/ACT nasal spray Place 2 sprays into both nostrils at bedtime as needed for allergies or rhinitis.    Historical Provider, MD  hydrocortisone (CORTEF) 20 MG tablet Take 1 tablet at breakfast or lunch and 1/2 tablet at dinner. Patient not taking:  Reported on 07/13/2015 06/23/15   Reather Littler, MD  hydrocortisone sodium succinate (SOLU-CORTEF) 100 MG SOLR injection Inject 0.25ml in the am; 0.14ml in the pm. 08/07/15   Reather Littler, MD  Insulin Syringe-Needle U-100 (INSULIN SYRINGE .5CC/31GX5/16") 31G X 5/16" 0.5 ML MISC Use 2 per day to inject Hydrocortisone 11/13/14   Reather Littler, MD  metoCLOPramide (REGLAN) 5 MG tablet Take 1 tablet (5 mg total) by mouth 3 (three) times daily before meals. 07/20/15   Reather Littler, MD  metoCLOPramide (REGLAN) 5 MG/5ML solution Take 5 mLs (5 mg total) by mouth 4 (four) times daily -  before meals and at bedtime. 07/13/15  Reather Littler, MD  Mirtazapine (REMERON PO) Take 15 mg by mouth 2 (two) times daily. Reported on 07/13/2015    Historical Provider, MD  mirtazapine (REMERON) 15 MG tablet Take 15 mg by mouth at bedtime. Reported on 07/13/2015    Historical Provider, MD  Multiple Vitamin (MULITIVITAMIN WITH MINERALS) TABS Take 1 tablet by mouth daily.    Historical Provider, MD  NONFORMULARY OR COMPOUNDED ITEM Meloxicam 0.5%, Gabapentin 6%, Lidocaine 2%, Prilocaine 2% Appt 1-2 grams to the affected area(s) 3-4 times daily    Historical Provider, MD  ondansetron (ZOFRAN) 8 MG tablet Take 8 mg by mouth 3 (three) times daily as needed for nausea or vomiting.    Historical Provider, MD  Polyethyl Glycol-Propyl Glycol (SYSTANE OP) Apply 2 drops to eye daily as needed (for dry eyes).    Historical Provider, MD  potassium chloride (K-DUR,KLOR-CON) 10 MEQ tablet Take 2 in the morning and two at bedtime. 07/07/15   Reather Littler, MD  promethazine (PHENERGAN) 12.5 MG tablet Take 1 tablet (12.5 mg total) by mouth every 8 (eight) hours as needed for nausea or vomiting. Patient not taking: Reported on 07/13/2015 10/15/14   Reather Littler, MD  traZODone (DESYREL) 150 MG tablet Take 150 mg by mouth at bedtime.    Historical Provider, MD  trimethoprim (TRIMPEX) 100 MG tablet Take 100 mg by mouth at bedtime.  07/07/13   Historical Provider, MD  UNABLE TO FIND  Med Name: Compound Cream: meloxicam 0.5%, doxepin 3%, amantadine 3%, dextromethorphan 2%, lidocaine 2% ([facility] [phone])    Historical Provider, MD   BP 184/94 mmHg  Pulse 69  Temp(Src) 97.6 F (36.4 C) (Oral)  Resp 18  SpO2 100% Physical Exam  Constitutional: She appears well-developed and well-nourished. No distress.  HENT:  Head: Normocephalic.  Eyes: Conjunctivae are normal.  Neck: Neck supple.  Cardiovascular: Normal rate, regular rhythm and normal heart sounds.   Pulmonary/Chest: Effort normal and breath sounds normal. No respiratory distress. She has no wheezes. She has no rales.  Abdominal: Soft. Bowel sounds are normal. She exhibits no distension. There is no tenderness. There is no rebound.  Genitourinary:  Rectum normal  Musculoskeletal: She exhibits no edema.  Neurological: She is alert.  Skin: Skin is warm and dry.  Psychiatric: She has a normal mood and affect. Her behavior is normal.  Nursing note and vitals reviewed.   ED Course  Procedures (including critical care time) Labs Review Labs Reviewed - No data to display  Imaging Review No results found. I have personally reviewed and evaluated these images and lab results as part of my medical decision-making.   EKG Interpretation None      MDM   Final diagnoses:  Fecal impaction [facility])   Patient emergency department with fecal impaction. I reviewed her x-ray from 4 days ago, which shows a large fecal impaction. I will try to manually disimpact and then administer an enema.  12:54 PM I attempted to disimpact patient. I'm unable to feel the stool with my finger. We will try enema.  4:25 PM Pt had a small bowel movement. Will try to go again.   Pt was able to have a large bowel movement. Will dc home. She feels better.   Filed Vitals:   08/10/15 1445 08/10/15 1500 08/10/15 1602 08/10/15 1615  BP: 179/70 199/89 177/84 180/70  Pulse: 91 103 86 92  Temp:      TempSrc:       Resp:   15   SpO2:  100% 100% 99% 100%     Jaynie Crumble, PA-C 08/11/15 1610  Mancel Bale, MD 08/13/15 1228

## 2015-08-12 DIAGNOSIS — M4806 Spinal stenosis, lumbar region: Secondary | ICD-10-CM | POA: Diagnosis not present

## 2015-08-12 DIAGNOSIS — M5417 Radiculopathy, lumbosacral region: Secondary | ICD-10-CM | POA: Diagnosis not present

## 2015-08-12 DIAGNOSIS — R030 Elevated blood-pressure reading, without diagnosis of hypertension: Secondary | ICD-10-CM | POA: Diagnosis not present

## 2015-08-13 ENCOUNTER — Telehealth: Payer: Self-pay | Admitting: Endocrinology

## 2015-08-13 DIAGNOSIS — R350 Frequency of micturition: Secondary | ICD-10-CM | POA: Diagnosis not present

## 2015-08-13 DIAGNOSIS — R300 Dysuria: Secondary | ICD-10-CM | POA: Diagnosis not present

## 2015-08-13 NOTE — Telephone Encounter (Signed)
PT needs the Solu-Cortef Injection prescription written for 0.4 AM and 0.2 PM sent to [facility].  She stated she has called several times with no response from [place].  Also she stated that she will no longer be taking her Hydrcortisone for blood pressure because of some issues

## 2015-08-14 ENCOUNTER — Other Ambulatory Visit: Payer: Self-pay

## 2015-08-14 DIAGNOSIS — R300 Dysuria: Secondary | ICD-10-CM | POA: Diagnosis not present

## 2015-08-14 MED ORDER — HYDROCORTISONE NA SUCCINATE PF 100 MG IJ SOLR: INTRAMUSCULAR | Status: DC

## 2015-08-14 NOTE — Telephone Encounter (Signed)
[place] did receive the rx from 6/30 we sent

## 2015-08-21 DIAGNOSIS — G4700 Insomnia, unspecified: Secondary | ICD-10-CM | POA: Diagnosis not present

## 2015-08-21 DIAGNOSIS — R110 Nausea: Secondary | ICD-10-CM | POA: Diagnosis not present

## 2015-08-21 DIAGNOSIS — M545 Low back pain: Secondary | ICD-10-CM | POA: Diagnosis not present

## 2015-08-21 DIAGNOSIS — G8929 Other chronic pain: Secondary | ICD-10-CM | POA: Diagnosis not present

## 2015-08-31 DIAGNOSIS — M5417 Radiculopathy, lumbosacral region: Secondary | ICD-10-CM | POA: Diagnosis not present

## 2015-08-31 DIAGNOSIS — M961 Postlaminectomy syndrome, not elsewhere classified: Secondary | ICD-10-CM | POA: Diagnosis not present

## 2015-08-31 DIAGNOSIS — M4806 Spinal stenosis, lumbar region: Secondary | ICD-10-CM | POA: Diagnosis not present

## 2015-08-31 DIAGNOSIS — F1120 Opioid dependence, uncomplicated: Secondary | ICD-10-CM | POA: Diagnosis not present

## 2015-09-10 DIAGNOSIS — M79641 Pain in right hand: Secondary | ICD-10-CM | POA: Diagnosis not present

## 2015-09-10 DIAGNOSIS — M810 Age-related osteoporosis without current pathological fracture: Secondary | ICD-10-CM | POA: Diagnosis not present

## 2015-09-10 DIAGNOSIS — R5383 Other fatigue: Secondary | ICD-10-CM | POA: Diagnosis not present

## 2015-09-10 DIAGNOSIS — R300 Dysuria: Secondary | ICD-10-CM | POA: Diagnosis not present

## 2015-09-10 DIAGNOSIS — E559 Vitamin D deficiency, unspecified: Secondary | ICD-10-CM | POA: Diagnosis not present

## 2015-09-16 ENCOUNTER — Telehealth: Payer: Self-pay | Admitting: Neurology

## 2015-09-16 NOTE — Telephone Encounter (Signed)
Returned call to Kimberlee Nearing - confirmed we received the fax on 09/10/15.  She is aware that Dr. Terrace Arabia is out until 09/21/15 and she said it was ok to wait until her return.

## 2015-09-16 NOTE — Telephone Encounter (Signed)
[place] [phone] ext [phone] called regarding refill request for #31 topical pain cream 240 grams (alternative), states 29 A no longer available. Initially faxed request on July 25th to [phone], started faxing alternative refill request on August 1st (insurance will cover this medication), this medication does require prior auth. Please call to advise.

## 2015-09-21 ENCOUNTER — Encounter: Payer: Self-pay | Admitting: *Deleted

## 2015-09-23 DIAGNOSIS — M4806 Spinal stenosis, lumbar region: Secondary | ICD-10-CM | POA: Diagnosis not present

## 2015-09-25 DIAGNOSIS — M79642 Pain in left hand: Secondary | ICD-10-CM | POA: Diagnosis not present

## 2015-09-25 DIAGNOSIS — M810 Age-related osteoporosis without current pathological fracture: Secondary | ICD-10-CM | POA: Diagnosis not present

## 2015-09-29 DIAGNOSIS — F1120 Opioid dependence, uncomplicated: Secondary | ICD-10-CM | POA: Diagnosis not present

## 2015-09-29 DIAGNOSIS — M4806 Spinal stenosis, lumbar region: Secondary | ICD-10-CM | POA: Diagnosis not present

## 2015-09-29 DIAGNOSIS — M5417 Radiculopathy, lumbosacral region: Secondary | ICD-10-CM | POA: Diagnosis not present

## 2015-09-29 DIAGNOSIS — M961 Postlaminectomy syndrome, not elsewhere classified: Secondary | ICD-10-CM | POA: Diagnosis not present

## 2015-10-08 ENCOUNTER — Other Ambulatory Visit (INDEPENDENT_AMBULATORY_CARE_PROVIDER_SITE_OTHER): Payer: PPO

## 2015-10-08 DIAGNOSIS — R5383 Other fatigue: Secondary | ICD-10-CM

## 2015-10-08 DIAGNOSIS — E2749 Other adrenocortical insufficiency: Secondary | ICD-10-CM

## 2015-10-08 LAB — BASIC METABOLIC PANEL
BUN: 19 mg/dL (ref 6–23)
CHLORIDE: 105 meq/L (ref 96–112)
CO2: 29 mEq/L (ref 19–32)
CREATININE: 1.06 mg/dL (ref 0.40–1.20)
Calcium: 8.9 mg/dL (ref 8.4–10.5)
GFR: 54.79 mL/min — ABNORMAL LOW (ref >60.00)
Glucose, Bld: 95 mg/dL (ref 70–99)
Potassium: 4 mEq/L (ref 3.5–5.1)
Sodium: 137 mEq/L (ref 135–145)

## 2015-10-08 LAB — TSH: TSH: 1.67 u[IU]/mL (ref 0.35–4.50)

## 2015-10-08 LAB — T4, FREE: FREE T4: 0.74 ng/dL (ref 0.60–1.60)

## 2015-10-13 ENCOUNTER — Other Ambulatory Visit: Payer: Self-pay | Admitting: *Deleted

## 2015-10-13 ENCOUNTER — Ambulatory Visit (INDEPENDENT_AMBULATORY_CARE_PROVIDER_SITE_OTHER): Payer: PPO | Admitting: Endocrinology

## 2015-10-13 VITALS — BP 94/66 | Ht 60.0 in | Wt 111.0 lb

## 2015-10-13 DIAGNOSIS — G903 Multi-system degeneration of the autonomic nervous system: Secondary | ICD-10-CM | POA: Diagnosis not present

## 2015-10-13 DIAGNOSIS — I951 Orthostatic hypotension: Secondary | ICD-10-CM

## 2015-10-13 DIAGNOSIS — E2749 Other adrenocortical insufficiency: Secondary | ICD-10-CM

## 2015-10-13 MED ORDER — "INSULIN SYRINGE 31G X 5/16"" 0.5 ML MISC": 5 refills | Status: DC

## 2015-10-13 NOTE — Patient Instructions (Addendum)
Take full Florinef daily  Magnesium 2x daily

## 2015-10-13 NOTE — Progress Notes (Signed)
Patient ID: Katrina Rivera, female   DOB: Dec 24, 1945, 70 y.o.   MRN: [id number]   Subjective:         Chief complaint: Tiredness  PROBLEM 1: Dysautonomic orthostatic hypotension   PAST history: She has had long-standing problems with orthostatic hypotension and also hyponatremia. Has been diagnosed with dysautonomia and has multiple other problems related to autonomic neuropathy.   She has been on Florinef since 2004 previously taking 3 tablets daily along with 5 tablets of potassium which had previously controlled her symptoms well . Because of persistent orthostatic symptoms she had been tried on midodrine on 05/28/12 but this was later stopped when blood pressure increased.  She  had medication adjustments done frequently over the last year for regulating her blood pressure.  RECENT history:   She has previously been on variable doses of Florinef, as much as 3 a day She is more recently taking significantly lower doses, currently still taking a half of the 0.1 mg tablet daily  In the morning   She has checked her blood pressure periodically but not consistently. About 2 weeks ago she started getting a little lightheadedness on standing up She thinks this happened a couple of days after she had a steroid intention of back She has done some readings sitting and standing However has had only one documented standing blood pressure below 100 and twice below 120    Hypokalemia:  She is still taking 2 tablets of potassium and potassium has been normal, usually trying to take these at bedtime. However complaining about muscle cramps both day and night   Lab Results  Component Value Date   CREATININE 1.06 10/08/2015   BUN 19 10/08/2015   NA 137 10/08/2015   K 4.0 10/08/2015   CL 105 10/08/2015   CO2 29 10/08/2015     Adrenal Insufficiency:   This is secondary to pituitary dysfunction  Prior testing included Cortrosyn test showing stimulated level of 15.7, baseline  3.9.  Also confirmed by 24 hour urine free cortisol which was only 3.  Although previously she had tolerated oral hydrocortisone and small doses with improvement in her symptoms she did not continue this long-term She was again symptomatic with weight loss, decreased appetite, nausea and also diarrhea Cortrosyn stimulation test on 03/12/14 showed baseline cortisol level of 0.3 and post injection of 1.1 only  She had GI side effects from hydrocortisone , methylprednisolone and prednisone and was given hydrocortisone injections using insulin syringe  Currently is on  hydrocortisone injections    RECENT HISTORY: She has been back on injectable hydrocortisone because of GI side effects from oral formulations   She did initially feel better when her morning dose was increased to 20 mg hydrocortisone previously Currently taking 20 mg and after waking up and 10 mg around 5 PM   She is again complaining of feeling tired , no energy level  However her nausea has resolved and she has no difficulty with her appetite, has gained back some weight  Electrolytes are stable  Wt Readings from Last 3 Encounters:  10/13/15 111 lb (50.3 kg)  07/13/15 108 lb (49 kg)  04/10/15 109 lb 3.2 oz (49.5 kg)    General Endocrinology:  ?  Hypopituitarism: She has had a low normal free T4 in the past  but did not tolerate thyroxine supplementation in the past which were tried because of her continued symptoms of fatigue Free T4 has been now consistently normal  Has not had any  abnormalities of the pituitary on previous MRI of the brain  Lab Results  Component Value Date   TSH 1.67 10/08/2015   TSH 1.01 03/09/2015   TSH 2.75 06/09/2014   FREET4 0.74 10/08/2015   FREET4 0.88 03/09/2015   FREET4 0.64 06/09/2014        Medication List       Accurate as of 10/13/15 10:25 AM. Always use your most recent med list.          alfuzosin 10 MG 24 hr tablet Commonly known as:  UROXATRAL Take 10 mg by mouth daily  with breakfast.   cephALEXin 250 MG capsule Commonly known as:  KEFLEX   DULoxetine 30 MG capsule Commonly known as:  CYMBALTA   fentaNYL 25 MCG/HR patch Commonly known as:  DURAGESIC - dosed mcg/hr Place 25 mcg onto the skin every 3 (three) days.   fentaNYL 50 MCG/HR Commonly known as:  DURAGESIC - dosed mcg/hr Place 1 patch (50 mcg total) onto the skin every 3 (three) days.   Ferrous Sulfate Dried 200 (65 Fe) MG Tabs Take 65 mg by mouth daily.   FLEXERIL 10 MG tablet Generic drug:  cyclobenzaprine Take 10 mg by mouth 3 (three) times daily as needed for muscle spasms. Reported on 03/13/2015   fludrocortisone 0.1 MG tablet Commonly known as:  FLORINEF One tablet in the morning and two tablets at night.   fluticasone 50 MCG/ACT nasal spray Commonly known as:  FLONASE Place 2 sprays into both nostrils at bedtime as needed for allergies or rhinitis.   gabapentin 600 MG tablet Commonly known as:  NEURONTIN   hydrocortisone 20 MG tablet Commonly known as:  CORTEF Take 1 tablet at breakfast or lunch and 1/2 tablet at dinner.   hydrocortisone sodium succinate 100 MG Solr injection Commonly known as:  SOLU-CORTEF Inject 0.664ml in the am; 0.302ml in the pm.   INSULIN SYRINGE .5CC/31GX5/16" 31G X 5/16" 0.5 ML Misc Use 2 per day to inject Hydrocortisone   BD INSULIN SYRINGE ULTRAFINE 31G X 15/64" 0.5 ML Misc Generic drug:  Insulin Syringe-Needle U-100 USE TWICE DAILY.   metoCLOPramide 5 MG/5ML solution Commonly known as:  REGLAN Take 5 mLs (5 mg total) by mouth 4 (four) times daily -  before meals and at bedtime.   metoCLOPramide 5 MG tablet Commonly known as:  REGLAN Take 1 tablet (5 mg total) by mouth 3 (three) times daily before meals.   NONFORMULARY OR COMPOUNDED ITEM Apply 1 application topically daily as needed (for pain). Meloxicam 0.5%, Gabapentin 6%, Lidocaine 2%, Prilocaine 2%   potassium chloride 10 MEQ tablet Commonly known as:  K-DUR,KLOR-CON Take 2 in the  morning and two at bedtime.   promethazine 12.5 MG tablet Commonly known as:  PHENERGAN Take 1 tablet (12.5 mg total) by mouth every 8 (eight) hours as needed for nausea or vomiting.   SYSTANE OP Apply 2 drops to eye daily as needed (for dry eyes).   traZODone 150 MG tablet Commonly known as:  DESYREL Take 150 mg by mouth at bedtime.   trimethoprim 100 MG tablet Commonly known as:  TRIMPEX Take 100 mg by mouth at bedtime.   VITAMIN D3 SUPER STRENGTH 2000 units Caps Generic drug:  Cholecalciferol Take 2,000 Units by mouth daily.       Review of Systems   NAUSEA: This has been much better and she is not taking Reglan because of tremor Her GI specialist does not know why she had nausea  FATIGUE and  difficulty sleeping: She continues to have  Insomnia Also on Cymbalta now  Increased sweating:  Previously had significant sweating episodes but these are better  Genitourinary: She has had difficulty emptying her bladder and is being treated by urologist with various medications, Marcia Brash had been on Urecholine also     Objective:   Physical Exam  BP 94/66 (BP Location: Left Arm)   Ht 5' (1.524 m)   Wt 111 lb (50.3 kg)   BMI 21.68 kg/m   Blood pressure sitting was 126/72  No pedal edema  Assessment:      Adrenal insufficiency:  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 and very low cortisol levels on the stimulation test Since she did not tolerate oral medications she is taking injectable hydrocortisone twice a day, using 0.4 mL in the morning and 0.2 mL in the evening   Clinically doing well  Most likely her fatigue is unrelated to her adrenal insufficiency    Orthostatic dysautonomic hypotension syndrome:   Her blood pressure appears to be getting lower again standing more recentlyWith symptoms, previously had no difficulties with  half tablet of Florinef 0.1 mg for over 3 months    NAUSEA and decreased appetite:  Resolved Etiology unclear  FATIGUE: Likely to be endocrine related as she is on adequate doses of hydrocortisone and free T4 is not low   Plan:       Trial of  0.1 mg Florinef again She will call if blood pressure is not consistently controlled May try OTC magnesium for cramps and follow-up with PCP  Patient Instructions  Take full Florinef daily  Magnesium 2x daily  .  Porsche Noguchi

## 2015-10-20 DIAGNOSIS — I10 Essential (primary) hypertension: Secondary | ICD-10-CM | POA: Diagnosis not present

## 2015-10-20 DIAGNOSIS — M961 Postlaminectomy syndrome, not elsewhere classified: Secondary | ICD-10-CM | POA: Diagnosis not present

## 2015-10-21 ENCOUNTER — Telehealth: Payer: Self-pay | Admitting: Endocrinology

## 2015-10-21 NOTE — Telephone Encounter (Signed)
Noted, patient is aware. 

## 2015-10-21 NOTE — Telephone Encounter (Signed)
Please see below and advise.

## 2015-10-21 NOTE — Telephone Encounter (Signed)
Pt calling to let us know the BP is dropping when she stands.  Today 130/76 standing 106/66 Yesterday 135/63 standing 111/60 9/10 137/76 standing 111/66

## 2015-10-21 NOTE — Telephone Encounter (Signed)
This is not an excessive drop.  However if she is feeling lightheaded she can increase the Florinef to 2 tablets daily

## 2015-10-22 ENCOUNTER — Encounter: Payer: Self-pay | Admitting: Nurse Practitioner

## 2015-10-22 ENCOUNTER — Ambulatory Visit (INDEPENDENT_AMBULATORY_CARE_PROVIDER_SITE_OTHER): Payer: PPO | Admitting: Nurse Practitioner

## 2015-10-22 VITALS — BP 136/76 | HR 84 | Ht 60.0 in | Wt 113.0 lb

## 2015-10-22 DIAGNOSIS — G4710 Hypersomnia, unspecified: Secondary | ICD-10-CM

## 2015-10-22 DIAGNOSIS — G35 Multiple sclerosis: Secondary | ICD-10-CM

## 2015-10-22 DIAGNOSIS — R5382 Chronic fatigue, unspecified: Secondary | ICD-10-CM | POA: Diagnosis not present

## 2015-10-22 DIAGNOSIS — R5383 Other fatigue: Secondary | ICD-10-CM | POA: Insufficient documentation

## 2015-10-22 DIAGNOSIS — R269 Unspecified abnormalities of gait and mobility: Secondary | ICD-10-CM | POA: Diagnosis not present

## 2015-10-22 DIAGNOSIS — G609 Hereditary and idiopathic neuropathy, unspecified: Secondary | ICD-10-CM

## 2015-10-22 DIAGNOSIS — R400 Somnolence: Secondary | ICD-10-CM

## 2015-10-22 NOTE — Patient Instructions (Addendum)
Okay to stop Cymbalta Will set up for sleep study Follow up yearly

## 2015-10-22 NOTE — Progress Notes (Signed)
[facility]  PATIENT: Katrina Rivera DOB: 1945/05/26   REASON FOR VISIT: Follow-up for multiple sclerosis  fatigue daytime drowsiness HISTORY FROM: Patient    HISTORY OF PRESENT ILLNESS: HISTORY YYMrs. Katrina Rivera is a very pleasant 70 year-old right-handed woman, with relapsing remitting multiple sclerosis, was treated with Betaseron 4 -5 years, but could not tolerate the side effect, now is not on any immunomodulation therapy. neuropathic pain of bilateral lower extremities.   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   She has baseline gait difficulty, she fell in May 2013, fractured both elbows and her left knee cap.  She intermittently has been using a cane or walker.  She could not tolerate Requip because of nausea. She has been on gabapentin 300 mg 3 bid. She gained significant weight when on Lyrica, and therefore was switched back to gabapentin. She has orthostatic hypotension, and is on midodrine 2.5 mg and fludrocortisone 0.1 mg 1 in AM and 2 at night, and she is also on 20 mg of hydrocortisone.  She complains of bilateral lower extremity burning stinging sensation, getting worse when she sits still, difficulty falling into sleep, she has the urge to move because of bilateral extremity discomfort, has tried Requip, could not tolerate it because of GI side effects, Neuprol patch does not work either, she also tried Elavil, Cymbalta in the past, could not tolerate it due to side effect.   Jan 01/2014:YY  She came in urgently for acute worsening of her generalized condition since her lumbar decompression surgery in January 24 2013 by [name], prior to surgery, she suffered left-sided low back pain, radiating pain to bilateral lower extremity, left is much worse than her right side, there was left L5-S1 extraforaminal herniated nucleus pulposus with left L5 radiculopathy she had left L5-S1 extraforaminal microdiscectomy utilizing microscopic dissection.  She had overnight stay at the hospital the day after surgery, she fell when trying to get up using bathroom, landed on her left side, surgery did help her lower back pain, radiating pain to her left leg, but she complained of worsening bilateral lower extremity deep achy pain, worsening gait difficulty, she has not used walker for 3 years, now she began to use her walker, she felt her skin is so tight at bilateral lower extremity, left leg swelling, also has difficulty swallowing, blurred vision, worsening fatigue, difficulty concentration, she has not had MRI evaluation for her multiple sclerosis for many years, is not on any immunomodulation therapy   UPDATE Mar 01, 2014:YY She came in with a list of complains, much worse compared to presurgical level. She complains of blurred vision, difficulty sleeping, difficulty concentrating, dizziness, worsening gait difficulty, The most bothersome symptoms are bilateral lower extremity pain, constant, knife cutting pain, left worse than right Left leg showed no DVT on doppler study. She had 15 LB weight gain over one month, more difficulty sleepy, body shaking, more difficulty sleepy.  She has tried fentanyl patch 50 mcg, Trileptal, Neurontin without helping her symptoms .She is going to be seen by her surgeon Dr. Yetta BarreJones in 3 days  We have  reviewed MRI of the brain cervical lumbar spine together, these were done at [facility] in January 2015 MRI lumbar: L5-S1: disc bulging and facet hypertrophy, status  post microdiscectomy on the left, with expected post-surgical changes. Multi-level facet hypertrophy, with no spinal stenosis or foraminal narrowing.  MRI cervical: Possible small chronic demyelinating plaque at C6 on the right side. No abnormal enhancing lesions  MRI brain: Multiple supratentorial and 1 infratentorial chronic demyelinating plaques. No acute plaques. Mild cerebellar tonsillar ectopia there was no significant change in the brain lesions,   UPDATE 7/16/15YY Patient returns for follow up. Her biggest  complaint today is restless legs. She has never tried Neupro. She has had multiple trials of medications for her chronic pain. She is currently taking fentanyl, Trileptal, Cymbalta, lidocaine gel  UPDATE Feb 27 2014:YYShe is not on any long-term immunomodulation therapy for her relapsing remitting multiple sclerosis, neurological deficit has been fairly stable, she has baseline gait difficulty, the most bothersome symptoms for her is low back pain, bilateral lower extremity deep achy pain, radiating pain from left lower back to her left leg,  She was recently found to have very low cortisol level, under endocrinologist Dr. Remus Blake care, UPDATE June 15 2016YY: AS patient symptoms. Return to work with restrictions due to dizziness several days as well as we have a job description. She initially presented She had lumbar decompression by Dr. Marikay Alar in Jun 18 2014, which did help her low back pain, but now she experienced worsening left lower extremity spasm, left calf spasm so hard, as if a knot was tied, difficulty walking, she was giving a steroid package, no help, She complained excessive weight gain with Lyrica, Neurontin did not help, UPDATE July 27th 2016:YY She came in with a list of complaints, continue complains of unbalanced, staggering, generalized weakness, numbness tingling at bilateral lower extremity, worsening at her left leg, frequent urination, difficulty concentrating,  difficulty with multitasking UPDATE Oct 22 2014:YY She had a syncope episode in October 08 2014, was taken to the emergency room, have reviewed ED record, blood pressure was 200/60s laboratory showed normal CBC, CMP,  this happened in the setting of missing her hydrocortisone dose, because of nausea, lack of appetite, dehydration, she is taking hydrocortisone because of Addison's disease, now she is on hydrocortisone shots,She is back to her baseline now, mild gait difficulty, also complains of anxiety, constant bilateral lower chamber paresthesia, she wants to get off Cymbalta 30 mg daily, worry about the long-term side effect. She also complains of bilateral neck, shoulder or upper extremity itching,  UPDATE 10/22/15 CM Ms. Meixner, 70 year old female returns for follow-up. She has a history of multiple sclerosis, diagnosed in 1984, currently not on any immunomodulating therapy. She has mild gait difficulty, significant back pain. She has been seen by Dr. Murray Hodgkins for injections and this did not work. She is due to get a spinal cord stimulator. She is currently on an antibiotic for urinary tract infection. She complains of a lot of insomnia has been on trazodone in the past and Ambien still has difficulty sleeping. Husband says she snores. She has daytime drowsiness and fatigue She returns for reevaluation. Previous records reviewed REVIEW OF SYSTEMS: Full 14 system review of systems performed and notable only for those listed, all others are neg:  Constitutional:  Fatigue Cardiovascular: neg Ear/Nose/Throat: neg  Skin: neg Eyes: neg Respiratory: neg Gastroitestinal: neg  Hematology/Lymphatic: neg  Endocrine: neg Musculoskeletal weakness, back pain Allergy/Immunology: neg Neurological:  Numbness Psychiatric:  Anxiety Sleep :  insomnia, daytime sleepiness ALLERGIES: No Known Allergies  HOME MEDICATIONS: Outpatient Medications Prior to Visit  Medication Sig Dispense Refill  . alfuzosin  (UROXATRAL) 10 MG 24 hr tablet Take 10 mg by mouth daily with breakfast.    . BD INSULIN SYRINGE ULTRAFINE 31G X 15/64" 0.5 ML MISC USE TWICE DAILY. 90 each 5  . Cholecalciferol (VITAMIN D3 SUPER STRENGTH) 2000 units CAPS Take 2,000 Units by mouth daily.     . cyclobenzaprine (FLEXERIL) 10 MG tablet Take 10 mg by mouth 3 (three) times daily as needed for muscle spasms. Reported on 03/13/2015    . DULoxetine (CYMBALTA) 30 MG capsule     . Ferrous Sulfate Dried 200 (65 FE) MG TABS Take 65 mg by mouth daily.    . fludrocortisone (FLORINEF) 0.1 MG tablet One tablet in the morning and two tablets at night. (Patient taking differently: Take 0.1 mg by mouth daily. One tablet in the morning and two tablets at night.) 270 tablet 3  . fluticasone (FLONASE) 50 MCG/ACT nasal spray Place 2 sprays into both nostrils at bedtime as needed for allergies or rhinitis.    Marland Kitchen gabapentin (NEURONTIN) 600 MG tablet Take 600 mg by mouth 3 (three) times daily.     . hydrocortisone sodium succinate (SOLU-CORTEF) 100 MG SOLR injection Inject 0.59ml in the am; 0.31ml in the pm. 12 vial 3  . Insulin Syringe-Needle U-100 (INSULIN SYRINGE .5CC/31GX5/16") 31G X 5/16" 0.5 ML MISC Use 2 per day to inject Hydrocortisone 60 each 5  . NONFORMULARY OR COMPOUNDED ITEM Apply 1 application topically daily as needed (for pain). Meloxicam 0.5%, Gabapentin 6%, Lidocaine 2%, Prilocaine 2%    . Polyethyl Glycol-Propyl Glycol (SYSTANE OP) Apply 2 drops to eye daily as needed (for dry eyes).    . potassium chloride (K-DUR,KLOR-CON) 10 MEQ tablet Take 2 in the morning and two at bedtime. (Patient taking differently: Take 10 mEq by mouth at bedtime. Take 2 in the morning and two at bedtime.) 360 tablet 3  . promethazine (PHENERGAN) 12.5 MG tablet Take 1 tablet (12.5 mg total) by mouth every 8 (eight) hours as needed for nausea or vomiting. 30 tablet 2  . fentaNYL (DURAGESIC - DOSED MCG/HR) 25 MCG/HR patch Place 25 mcg onto the skin every 3 (three) days.      . fentaNYL (DURAGESIC - DOSED MCG/HR) 50 MCG/HR Place 1 patch (50 mcg total) onto the skin every 3 (three) days. (Patient not taking: Reported on 10/22/2015) 30 patch 0  . metoCLOPramide (REGLAN) 5 MG tablet Take 1 tablet (5 mg total) by mouth 3 (three) times daily before meals. (Patient not taking: Reported on 10/22/2015) 90 tablet 1  . metoCLOPramide (REGLAN) 5 MG/5ML solution Take 5 mLs (5 mg total) by mouth 4 (four) times daily -  before meals and at bedtime. (Patient not taking: Reported on 10/22/2015) 120 mL 0  . traZODone (DESYREL) 150 MG tablet Take 150 mg by mouth at bedtime.    Marland Kitchen trimethoprim (TRIMPEX) 100 MG tablet Take 100 mg by mouth at bedtime.      No facility-administered medications prior to visit.     PAST MEDICAL HISTORY: Past Medical History:  Diagnosis Date  . Addison's disease (HCC)    takes Solu Cortef daily  . Anemia    takes Ferrous Sulfate daily  . Anxiety    takes Xanax nightly  . Arthritis   . Chronic back pain    stenosis  . Depression    takes Cymbalta daily  .  Dizziness    if b/p drops   . Fibromyalgia   . Fibromyalgia   . GERD (gastroesophageal reflux disease)   . Headache(784.0)   . History of blood transfusion    no abnormal reaction noted  . History of bronchitis    many yrs ago   . Hypokalemia    takes Potassium daily  . Hypotension   . Hypotension    takes Florinef daily  . IBS (irritable bowel syndrome)    takes Librarian, academic daily  . Insomnia    takes Trazodone nightly  . Joint pain   . Multiple sclerosis (HCC)    doesn't take any meds  . Multiple sclerosis (HCC)   . Nocturia   . Osteoporosis   . Peripheral neuropathy (HCC)   . Restless leg syndrome   . Seasonal allergies    takes Zyrtec daily;uses Flonase daily as needed  . Syncope   . Urinary frequency    takes Flomax daily  . Weakness    numbness and tingling    PAST SURGICAL HISTORY: Past Surgical History:  Procedure Laterality Date  . ABDOMINAL HYSTERECTOMY  1988  .  APPENDECTOMY  1988  . CESAREAN SECTION  1973/1977   x2  . CHOLECYSTECTOMY  1997  . COLECTOMY  1990  . COLONOSCOPY    . ESOPHAGOGASTRODUODENOSCOPY    . EYE SURGERY     bilateral - /w IOL  . FRACTURE SURGERY Right    rods and screws  . LUMBAR LAMINECTOMY/DECOMPRESSION MICRODISCECTOMY Left 01/24/2013   Procedure: LUMBAR FIVE TO SACRAL ONE LUMBAR LAMINECTOMY/DECOMPRESSION MICRODISCECTOMY 1 LEVEL;  Surgeon: Tia Alert, MD;  Location: [facility];  Service: Neurosurgery;  Laterality: Left;  Marland Kitchen MAXIMUM ACCESS (MAS)POSTERIOR LUMBAR INTERBODY FUSION (PLIF) 1 LEVEL N/A 06/18/2014   Procedure: MAXIMUM ACCESS SURGERY POSTERIOR LUMBAR INTERBODY FUSION LUMBAR FIVE TO SACRAL ONE ;  Surgeon: Tia Alert, MD;  Location: [facility];  Service: Neurosurgery;  Laterality: N/A;    FAMILY HISTORY: Family History  Problem Relation Age of Onset  . Hypertension Mother   . Stroke Mother   . Heart attack Father     SOCIAL HISTORY: Social History   Social History  . Marital status: Married    Spouse name: [name]  . Number of children: 2  . Years of education: 12   Occupational History  .  Disabled    Disabled   Social History Main Topics  . Smoking status: Never Smoker  . Smokeless tobacco: Never Used  . Alcohol use No  . Drug use: No  . Sexual activity: Not on file   Other Topics Concern  . Not on file   Social History Narrative   Pt lives at home with spouse. ([name])   Caffeine Use: 1 cups daily.   Right handed.   Disabled.   Education - high school   Patient has two adult children.     PHYSICAL EXAM  Vitals:   10/22/15 1015  BP: 136/76  Pulse: 84  Weight: 113 lb (51.3 kg)  Height: 5' (1.524 m)   Body mass index is 22.07 kg/m.  Generalized: Well developed, in no acute distress  Head: normocephalic and atraumatic,. Oropharynx benign  Neck: Supple, no carotid bruits  Cardiac: Regular rate rhythm, no murmur  Musculoskeletal: No deformity   Neurological examination    Mentation: Alert oriented to time, place, history taking. Attention span and concentration appropriate. Recent and remote memory intact.  Follows all commands speech and language fluent. ESS 4.  FSS 51  Cranial nerve II-XII: Fundoscopic exam reveals sharp disc margins.Pupils were equal round reactive to light extraocular movements were full, visual field were full on confrontational test. Facial sensation and strength were normal. hearing was intact to finger rubbing bilaterally. Uvula tongue midline. head turning and shoulder shrug were normal and symmetric.Tongue protrusion into cheek strength was normal. Motor:  mild bilateral lower extremity spasticity, no significant upper or lower extremity weakness Sensory: normal and symmetric to light touch, pinprick, and  Vibration,  in the upper and lower extremities Coordination: finger-nose-finger, heel-to-shin bilaterally, no dysmetria Reflexes: Brachioradialis 2/2, biceps 2/2, triceps 2/2, patellar 2/2, Achilles 2/2, plantar responses were flexor bilaterally. Gait and Station: Rising up from seated position without assistance, wide based  stance,  moderate stride, good arm swing, smooth turning, able to perform tiptoe, and heel walking without difficulty. Tandem gait is unsteady. No assistive device   DIAGNOSTIC DATA (LABS, IMAGING, TESTING) - I reviewed patient records, labs, notes, testing and imaging myself where available.  Lab Results  Component Value Date   WBC 7.0 12/09/2014   HGB 12.0 12/09/2014   HCT 36.5 12/09/2014   MCV 92.1 12/09/2014   PLT 222.0 12/09/2014      Component Value Date/Time   NA 137 10/08/2015 0958   K 4.0 10/08/2015 0958   CL 105 10/08/2015 0958   CO2 29 10/08/2015 0958   GLUCOSE 95 10/08/2015 0958   BUN 19 10/08/2015 0958   CREATININE 1.06 10/08/2015 0958   CREATININE 0.85 11/06/2013 0840   CALCIUM 8.9 10/08/2015 0958   PROT 6.3 07/08/2015 0952   ALBUMIN 4.2 07/08/2015 0952   AST 16 07/08/2015 0952   ALT  11 07/08/2015 0952   ALKPHOS 28 (L) 07/08/2015 0952   BILITOT 0.3 07/08/2015 0952   GFRNONAA >60 10/09/2014 0231   GFRAA >60 10/09/2014 0231    Lab Results  Component Value Date   VITAMINB12 993 (H) 09/03/2014   Lab Results  Component Value Date   TSH 1.67 10/08/2015      ASSESSMENT AND PLAN  70 y.o. year old female  has a past medical history of Relapsing remitting multiple sclerosis not on any modulation therapy, most recent MRI in August 2015 with stable. Long history of lumbar radiculopathy patient is due to get spinal cord stimulator. Bilateral lower extremity paresthesias on Neurontin and Cymbalta. History of chronic insomnia on trazodone and Ambien in the past without much benefit. Complains of daytime drowsiness, fatigue  and snoring.   PLAN: Okay to stop Cymbalta currently on 30mg   Will set up for sleep study for daytime drowsiness, insomnia, fatigue Follow up yearly Nilda Riggs, [facility], APRN  [facility] [address] [place], [place] [address] [phone]

## 2015-10-22 NOTE — Progress Notes (Signed)
I have reviewed and agreed above plan. 

## 2015-11-18 DIAGNOSIS — H04123 Dry eye syndrome of bilateral lacrimal glands: Secondary | ICD-10-CM | POA: Diagnosis not present

## 2015-11-18 DIAGNOSIS — Z961 Presence of intraocular lens: Secondary | ICD-10-CM | POA: Diagnosis not present

## 2015-11-18 DIAGNOSIS — H35373 Puckering of macula, bilateral: Secondary | ICD-10-CM | POA: Diagnosis not present

## 2015-11-18 DIAGNOSIS — H26492 Other secondary cataract, left eye: Secondary | ICD-10-CM | POA: Diagnosis not present

## 2015-11-18 DIAGNOSIS — H40013 Open angle with borderline findings, low risk, bilateral: Secondary | ICD-10-CM | POA: Diagnosis not present

## 2015-11-19 DIAGNOSIS — N3020 Other chronic cystitis without hematuria: Secondary | ICD-10-CM | POA: Diagnosis not present

## 2015-11-19 DIAGNOSIS — N318 Other neuromuscular dysfunction of bladder: Secondary | ICD-10-CM | POA: Diagnosis not present

## 2015-11-23 DIAGNOSIS — N183 Chronic kidney disease, stage 3 (moderate): Secondary | ICD-10-CM | POA: Diagnosis not present

## 2015-11-23 DIAGNOSIS — E2740 Unspecified adrenocortical insufficiency: Secondary | ICD-10-CM | POA: Diagnosis not present

## 2015-11-23 DIAGNOSIS — E46 Unspecified protein-calorie malnutrition: Secondary | ICD-10-CM | POA: Diagnosis not present

## 2015-11-23 DIAGNOSIS — M797 Fibromyalgia: Secondary | ICD-10-CM | POA: Diagnosis not present

## 2015-11-23 DIAGNOSIS — F5101 Primary insomnia: Secondary | ICD-10-CM | POA: Diagnosis not present

## 2015-11-23 DIAGNOSIS — Z23 Encounter for immunization: Secondary | ICD-10-CM | POA: Diagnosis not present

## 2015-11-26 DIAGNOSIS — M961 Postlaminectomy syndrome, not elsewhere classified: Secondary | ICD-10-CM | POA: Diagnosis not present

## 2015-11-26 DIAGNOSIS — F4542 Pain disorder with related psychological factors: Secondary | ICD-10-CM | POA: Diagnosis not present

## 2015-11-26 DIAGNOSIS — M48061 Spinal stenosis, lumbar region without neurogenic claudication: Secondary | ICD-10-CM | POA: Diagnosis not present

## 2015-11-26 DIAGNOSIS — F411 Generalized anxiety disorder: Secondary | ICD-10-CM | POA: Diagnosis not present

## 2015-12-03 DIAGNOSIS — H26493 Other secondary cataract, bilateral: Secondary | ICD-10-CM | POA: Diagnosis not present

## 2015-12-22 DIAGNOSIS — M961 Postlaminectomy syndrome, not elsewhere classified: Secondary | ICD-10-CM | POA: Diagnosis not present

## 2015-12-25 DIAGNOSIS — I10 Essential (primary) hypertension: Secondary | ICD-10-CM | POA: Diagnosis not present

## 2015-12-25 DIAGNOSIS — M961 Postlaminectomy syndrome, not elsewhere classified: Secondary | ICD-10-CM | POA: Diagnosis not present

## 2016-01-12 DIAGNOSIS — I10 Essential (primary) hypertension: Secondary | ICD-10-CM | POA: Diagnosis not present

## 2016-01-12 DIAGNOSIS — M961 Postlaminectomy syndrome, not elsewhere classified: Secondary | ICD-10-CM | POA: Diagnosis not present

## 2016-01-13 DIAGNOSIS — K5900 Constipation, unspecified: Secondary | ICD-10-CM | POA: Diagnosis not present

## 2016-01-20 ENCOUNTER — Other Ambulatory Visit (INDEPENDENT_AMBULATORY_CARE_PROVIDER_SITE_OTHER): Payer: PPO

## 2016-01-20 DIAGNOSIS — G903 Multi-system degeneration of the autonomic nervous system: Secondary | ICD-10-CM | POA: Diagnosis not present

## 2016-01-20 DIAGNOSIS — I951 Orthostatic hypotension: Secondary | ICD-10-CM

## 2016-01-20 LAB — BASIC METABOLIC PANEL
BUN: 22 mg/dL (ref 6–23)
CALCIUM: 10.9 mg/dL — AB (ref 8.4–10.5)
CHLORIDE: 99 meq/L (ref 96–112)
CO2: 35 mEq/L — ABNORMAL HIGH (ref 19–32)
CREATININE: 1.52 mg/dL — AB (ref 0.40–1.20)
GFR: 36.11 mL/min — AB (ref >60.00)
Glucose, Bld: 114 mg/dL — ABNORMAL HIGH (ref 70–99)
Potassium: 3.2 mEq/L — ABNORMAL LOW (ref 3.5–5.1)
Sodium: 141 mEq/L (ref 135–145)

## 2016-01-21 ENCOUNTER — Other Ambulatory Visit: Payer: Self-pay

## 2016-01-21 ENCOUNTER — Ambulatory Visit (INDEPENDENT_AMBULATORY_CARE_PROVIDER_SITE_OTHER): Payer: PPO | Admitting: Endocrinology

## 2016-01-21 VITALS — BP 120/62 | HR 97 | Ht 60.0 in | Wt 116.0 lb

## 2016-01-21 DIAGNOSIS — E876 Hypokalemia: Secondary | ICD-10-CM

## 2016-01-21 DIAGNOSIS — N289 Disorder of kidney and ureter, unspecified: Secondary | ICD-10-CM

## 2016-01-21 DIAGNOSIS — E2749 Other adrenocortical insufficiency: Secondary | ICD-10-CM | POA: Diagnosis not present

## 2016-01-21 DIAGNOSIS — N390 Urinary tract infection, site not specified: Secondary | ICD-10-CM

## 2016-01-21 LAB — POCT URINALYSIS DIPSTICK
Bilirubin, UA: NEGATIVE
Blood, UA: NEGATIVE
Glucose, UA: NEGATIVE
Ketones, UA: NEGATIVE
Nitrite, UA: NEGATIVE
PH UA: 7.5
PROTEIN UA: NEGATIVE
SPEC GRAV UA: 1.01
UROBILINOGEN UA: 0.2

## 2016-01-21 LAB — VITAMIN D 25 HYDROXY (VIT D DEFICIENCY, FRACTURES): VITD: 57.46 ng/mL (ref 30.00–100.00)

## 2016-01-21 MED ORDER — HYDROCORTISONE NA SUCCINATE PF 250 MG IJ SOLR: 250.0000 mg | Freq: Every day | INTRAMUSCULAR | 3 refills | Status: DC

## 2016-01-21 NOTE — Addendum Note (Signed)
Addended by: Adline Mango I on: 01/21/2016 12:24 PM   Modules accepted: Orders

## 2016-01-21 NOTE — Patient Instructions (Signed)
4 potassium daily

## 2016-01-21 NOTE — Addendum Note (Signed)
Addended by: Adline MangoSTONE-ELMORE, Harlei Lehrmann I on: 01/21/2016 04:28 PM   Modules accepted: Orders

## 2016-01-21 NOTE — Progress Notes (Signed)
Patient ID: Katrina Rivera, female   DOB: 1945/04/15, 70 y.o.   MRN: [id number]   Subjective:         Chief complaint: Follow-up for abnormal labs   PROBLEM 1: Dysautonomic orthostatic hypotension   PAST history: She has had long-standing problems with orthostatic hypotension and also hyponatremia. Has been diagnosed with dysautonomia and has multiple other problems related to autonomic neuropathy.   She has been on Florinef since 2004 previously taking 3 tablets daily along with 5 tablets of potassium which had previously controlled her symptoms well . Because of persistent orthostatic symptoms she had been tried on midodrine on 05/28/12 but this was later stopped when blood pressure increased.  She  had medication adjustments done frequently over the last year for regulating her blood pressure.  RECENT history:   She has previously been on variable doses of Florinef, as much as 3 a day She is more recently taking 1 tablet twice a day She increase the dose on her own in September because she thought her blood pressure was getting low and does have a list of her blood pressure readings which were only at times around 101's below 100 systolic standing  She has checked her blood pressure only once in the last 2-3 weeks and this was not low. She did feel lightheaded this morning when getting up and trying to walk and had to sit down  She says she feels thirsty for the last couple of weeks or so but is not urinating excessively Has not had any intercurrent illnesses recently Not clear why her renal function is worse    Hypokalemia:  She is still taking 2 tablets of potassium and did not change the dose when her Florinef was increased by herself Potassium is now low   Lab Results  Component Value Date   CREATININE 1.52 (H) 01/20/2016   BUN 22 01/20/2016   NA 141 01/20/2016   K 3.2 (L) 01/20/2016   CL 99 01/20/2016   CO2 35 (H) 01/20/2016     HYPERCALCEMIA:   This is new and she usually has a normal calcium.  She is taking vitamin D supplements as before and is on treatment with Prolia for osteoporosis from rheumatologist Not taking any extra calcium  Lab Results  Component Value Date   CALCIUM 10.9 (H) 01/20/2016   CALCIUM 8.9 10/08/2015   CALCIUM 9.3 07/08/2015   CALCIUM 9.7 04/10/2015   CALCIUM 10.4 03/09/2015   CALCIUM 10.0 12/09/2014   CALCIUM 9.4 10/09/2014   CALCIUM 9.3 08/08/2014   CALCIUM 9.8 07/14/2014     Adrenal Insufficiency:   This is secondary to pituitary dysfunction  Prior testing included Cortrosyn test showing stimulated level of 15.7, baseline 3.9.  Also confirmed by 24 hour urine free cortisol which was only 3.  Although previously she had tolerated oral hydrocortisone and small doses with improvement in her symptoms she did not continue this long-term She was again symptomatic with weight loss, decreased appetite, nausea and also diarrhea Cortrosyn stimulation test on 03/12/14 showed baseline cortisol level of 0.3 and post injection of 1.1 only  She had GI side effects from hydrocortisone , methylprednisolone and prednisone and was given hydrocortisone injections using insulin syringe  Currently is on  hydrocortisone injections    RECENT HISTORY: She has been back on injectable hydrocortisone because of GI side effects from oral formulations   She did initially feel better when her morning dose was increased to 20 mg hydrocortisone previously  Currently taking 20 mg and after waking up and 10 mg around 5 PM, She is getting the injections from her husband    She is again complaining of feeling tired as before She has gained back some weight  Electrolytes Showed low potassium but normal sodium   Wt Readings from Last 3 Encounters:  01/21/16 116 lb (52.6 kg)  10/22/15 113 lb (51.3 kg)  10/13/15 111 lb (50.3 kg)    General Endocrinology:  ?  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 Always use your most recent med list.          alfuzosin 10 MG 24 hr tablet Commonly known as:  UROXATRAL Take 10 mg by mouth daily with breakfast.   BD INSULIN SYRINGE ULTRAFINE 31G X 15/64" 0.5 ML Misc Generic drug:  Insulin Syringe-Needle U-100 USE TWICE DAILY.   INSULIN SYRINGE .5CC/31GX5/16" 31G X 5/16" 0.5 ML Misc Use 2 per day to inject Hydrocortisone   cephALEXin 250 MG capsule Commonly known as:  KEFLEX Take 250 mg by mouth at bedtime.   DULoxetine 30 MG capsule Commonly known as:  CYMBALTA   erythromycin 250 MG tablet Commonly known as:  E-MYCIN Take 250 mg by mouth 2 (two) times daily.   Ferrous Sulfate Dried 200 (65 Fe) MG Tabs Take 65 mg by mouth daily.   FLEXERIL 10 MG tablet Generic drug:  cyclobenzaprine Take 10 mg by mouth 3 (three) times daily as needed for muscle spasms. Reported on 03/13/2015   fludrocortisone 0.1 MG tablet Commonly known as:  FLORINEF One tablet in the morning and two tablets at night.   fluticasone 50 MCG/ACT nasal spray Commonly known as:  FLONASE Place 2 sprays into both nostrils at bedtime as needed for allergies or rhinitis.   gabapentin 600 MG tablet Commonly known as:  NEURONTIN Take 600 mg by mouth 3 (three) times daily.   hydrocortisone sodium succinate 100 MG Solr injection Commonly known as:  SOLU-CORTEF Inject 0.474ml in the am; 0.512ml in the pm.   NONFORMULARY OR COMPOUNDED ITEM Apply 1 application topically  daily as needed (for pain). Meloxicam 0.5%, Gabapentin 6%, Lidocaine 2%, Prilocaine 2%   omeprazole 40 MG capsule Commonly known as:  PRILOSEC Take 40 mg by mouth daily.   potassium chloride 10 MEQ tablet Commonly known as:  K-DUR,KLOR-CON Take 2 in the morning and two at bedtime.   promethazine 12.5 MG tablet Commonly known as:  PHENERGAN Take 1 tablet (12.5 mg total) by mouth every 8 (eight) hours as needed for nausea or vomiting.   SYSTANE OP Apply 2 drops to eye daily as needed (for dry eyes).   VITAMIN D3 SUPER STRENGTH 2000 units Caps Generic drug:  Cholecalciferol Take 2,000 Units by mouth daily.       Review of Systems   NAUSEA: This has been fairly well controlled   FATIGUE and difficulty sleeping:  She continues to have  Insomnia, now taking trazodone  Also on Cymbalta now  Increased sweating:  Previously had significant sweating episodes but these are better  Genitourinary: She has had difficulty emptying her bladder and is being treated by urologist with various medications,  She also is on cephalexin for UTI prophylaxis, does not think she had any symptoms of burning recently    Objective:   Physical Exam  BP 120/62 (BP Location: Left Arm, Cuff Size: Normal)   Pulse 97   Ht 5' (1.524 m)   Wt 116 lb (52.6 kg)   SpO2 96%   BMI 22.65 kg/m   Blood pressure Repeat Standing was 145/70 on the left side and 150/70 on the right side standing  mucous membranes are moist She looks well  Has no significant abdominal tenderness except slightly on the epigastrium Has mild bilateral flank tenderness, more on the left No mass palpable in the abdomen  No pedal edema  Assessment:      RENAL dysfunction:  Unclear why she has a high creatinine which has been usually normal She complains of increased thirst but does not look dehydrated  Will need to consider UTI or urinary tract obstruction  HYPERCALCEMIA: This is mild and unlikely to be symptomatic or  causing renal insufficiency, may be secondary to her renal insufficiency Will need to check at least vitamin D level today  Adrenal insufficiency:  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 and very low cortisol levels on the stimulation test Since she did not tolerate oral medications she is taking injectable hydrocortisone twice a day, using 0.4 mL in the morning and 0.2 mL in the evening  Clinically doing well She will continue the same dose     Orthostatic dysautonomic hypotension syndrome:   Her blood pressure appears to be  very well with some increased readings sitting both at home and here Since she had some orthostatic symptoms today will not reduce her Florinef as yet     Plan:       Will contact urologist regarding possible obstructive uropathy Check urinalysis today Increase potassium to 4 tablets Recheck renal function in 1 week Vitamin D level No change in Florinef for hydrocortisone   encouraged her to keep up with fluid intake  There are no Patient Instructions on file for this visit. Marland Kitchen  Jamae Tison  Note: This office note was prepared with Insurance underwriter. Any transcriptional errors that result from this process are unintentional.

## 2016-01-21 NOTE — Progress Notes (Signed)
Please let patient know that the urine shows probable infection, send labs to Dr. Mena Goes for treatment Also need to do full urinalysis and culture

## 2016-01-22 ENCOUNTER — Telehealth: Payer: Self-pay | Admitting: Endocrinology

## 2016-01-22 LAB — URINALYSIS, ROUTINE W REFLEX MICROSCOPIC
BILIRUBIN URINE: NEGATIVE
Hgb urine dipstick: NEGATIVE
KETONES UR: NEGATIVE
Leukocytes, UA: NEGATIVE
NITRITE: NEGATIVE
PH: 7.5 (ref 5.0–8.0)
Specific Gravity, Urine: 1.01 (ref 1.000–1.030)
TOTAL PROTEIN, URINE-UPE24: NEGATIVE
URINE GLUCOSE: NEGATIVE
Urobilinogen, UA: 0.2 (ref 0.0–1.0)

## 2016-01-22 NOTE — Progress Notes (Signed)
Please let patient know that the result from the full urinalysis shows no infection.  Also please confirm that she has not had any x-ray procedures with dye in the last month

## 2016-01-22 NOTE — Telephone Encounter (Signed)
She will need to have blood test for kidney function next week with any doctor.  Also her urologist is planning to do a kidney ultrasound.  Repeat urinalysis did not show infection

## 2016-01-22 NOTE — Telephone Encounter (Signed)
Pt has not had dye in the last month

## 2016-01-22 NOTE — Telephone Encounter (Signed)
Patient saw Katrina Rivera today and he told her kidneys were good and he would be sending something to [facility]

## 2016-01-24 LAB — CULTURE, URINE COMPREHENSIVE

## 2016-01-25 ENCOUNTER — Ambulatory Visit: Payer: PPO | Admitting: Endocrinology

## 2016-01-26 ENCOUNTER — Other Ambulatory Visit: Payer: Self-pay

## 2016-01-28 DIAGNOSIS — M961 Postlaminectomy syndrome, not elsewhere classified: Secondary | ICD-10-CM | POA: Diagnosis not present

## 2016-01-28 DIAGNOSIS — Z01812 Encounter for preprocedural laboratory examination: Secondary | ICD-10-CM | POA: Diagnosis not present

## 2016-02-03 ENCOUNTER — Other Ambulatory Visit: Payer: Self-pay

## 2016-02-03 MED ORDER — HYDROCORTISONE NA SUCCINATE PF 100 MG IJ SOLR: INTRAMUSCULAR | 3 refills | Status: DC

## 2016-02-10 DIAGNOSIS — M961 Postlaminectomy syndrome, not elsewhere classified: Secondary | ICD-10-CM | POA: Diagnosis not present

## 2016-02-10 DIAGNOSIS — Z969 Presence of functional implant, unspecified: Secondary | ICD-10-CM | POA: Diagnosis not present

## 2016-02-10 DIAGNOSIS — M545 Low back pain: Secondary | ICD-10-CM | POA: Diagnosis not present

## 2016-02-22 ENCOUNTER — Other Ambulatory Visit: Payer: Self-pay

## 2016-02-23 ENCOUNTER — Telehealth: Payer: Self-pay | Admitting: Endocrinology

## 2016-02-23 NOTE — Telephone Encounter (Signed)
Patient need a PA   hydrocortisone sodium succinate (SOLU-CORTEF) 100 MG SOLR injection

## 2016-02-24 ENCOUNTER — Ambulatory Visit: Payer: PPO | Admitting: Endocrinology

## 2016-02-26 ENCOUNTER — Other Ambulatory Visit: Payer: Self-pay

## 2016-02-26 ENCOUNTER — Telehealth: Payer: Self-pay | Admitting: Internal Medicine

## 2016-02-26 ENCOUNTER — Ambulatory Visit: Payer: PPO | Admitting: Endocrinology

## 2016-02-26 DIAGNOSIS — R110 Nausea: Secondary | ICD-10-CM | POA: Diagnosis not present

## 2016-02-26 DIAGNOSIS — E2740 Unspecified adrenocortical insufficiency: Secondary | ICD-10-CM | POA: Diagnosis not present

## 2016-02-26 DIAGNOSIS — F5101 Primary insomnia: Secondary | ICD-10-CM | POA: Diagnosis not present

## 2016-02-26 DIAGNOSIS — J0100 Acute maxillary sinusitis, unspecified: Secondary | ICD-10-CM | POA: Diagnosis not present

## 2016-02-26 MED ORDER — HYDROCORTISONE NA SUCCINATE PF 100 MG IJ SOLR: INTRAMUSCULAR | 3 refills | Status: DC

## 2016-02-26 NOTE — Telephone Encounter (Signed)
Patient is calling on the status of PA

## 2016-02-26 NOTE — Telephone Encounter (Signed)
[facility] is calling on the status of the PA

## 2016-02-26 NOTE — Telephone Encounter (Signed)
Need refill of hydrocortisone sodium succinate (SOLU-CORTEF) 100 MG

## 2016-02-26 NOTE — Telephone Encounter (Signed)
Will need to do a prior authorization, has intolerance to oral hydrocortisone.  Also needs to reschedule missed appointment today

## 2016-02-29 NOTE — Telephone Encounter (Signed)
[facility] # [phone] REF # [id number]  Pt is in need of the PA for the solu-cortef 100 mg

## 2016-02-29 NOTE — Telephone Encounter (Signed)
Form is with Dr. Lucianne Muss to sign

## 2016-02-29 NOTE — Telephone Encounter (Signed)
Megan Could you please give patient a call. Calling on status of PA

## 2016-02-29 NOTE — Telephone Encounter (Signed)
Katrina Rivera, I am not sure on the status of this. Has this been completed?

## 2016-03-01 ENCOUNTER — Emergency Department (HOSPITAL_COMMUNITY)
Admission: EM | Admit: 2016-03-01 | Discharge: 2016-03-01 | Disposition: A | Discharge status: Home / Self Care | Payer: PPO | Attending: Emergency Medicine | Admitting: Emergency Medicine

## 2016-03-01 ENCOUNTER — Encounter (HOSPITAL_COMMUNITY): Payer: Self-pay

## 2016-03-01 ENCOUNTER — Ambulatory Visit (INDEPENDENT_AMBULATORY_CARE_PROVIDER_SITE_OTHER): Payer: PPO | Admitting: Endocrinology

## 2016-03-01 ENCOUNTER — Telehealth: Payer: Self-pay

## 2016-03-01 ENCOUNTER — Encounter: Payer: Self-pay | Admitting: Endocrinology

## 2016-03-01 VITALS — BP 140/84 | HR 92 | Ht 60.0 in | Wt 107.0 lb

## 2016-03-01 DIAGNOSIS — E2749 Other adrenocortical insufficiency: Secondary | ICD-10-CM | POA: Diagnosis not present

## 2016-03-01 DIAGNOSIS — N289 Disorder of kidney and ureter, unspecified: Secondary | ICD-10-CM

## 2016-03-01 DIAGNOSIS — E860 Dehydration: Secondary | ICD-10-CM | POA: Insufficient documentation

## 2016-03-01 DIAGNOSIS — R110 Nausea: Secondary | ICD-10-CM

## 2016-03-01 DIAGNOSIS — Z794 Long term (current) use of insulin: Secondary | ICD-10-CM | POA: Insufficient documentation

## 2016-03-01 DIAGNOSIS — R112 Nausea with vomiting, unspecified: Secondary | ICD-10-CM | POA: Diagnosis not present

## 2016-03-01 LAB — CBC
HCT: 32.1 % — ABNORMAL LOW (ref 36.0–46.0)
Hemoglobin: 10.2 g/dL — ABNORMAL LOW (ref 12.0–15.0)
MCH: 27.8 pg (ref 26.0–34.0)
MCHC: 31.8 g/dL (ref 30.0–36.0)
MCV: 87.5 fL (ref 78.0–100.0)
PLATELETS: 276 10*3/uL (ref 150–400)
RBC: 3.67 MIL/uL — ABNORMAL LOW (ref 3.87–5.11)
RDW: 12.7 % (ref 11.5–15.5)
WBC: 7.5 10*3/uL (ref 4.0–10.5)

## 2016-03-01 LAB — COMPREHENSIVE METABOLIC PANEL
ALBUMIN: 3.9 g/dL (ref 3.5–5.0)
ALK PHOS: 40 U/L (ref 39–117)
ALK PHOS: 38 U/L (ref 38–126)
ALT: 9 U/L (ref 0–35)
ALT: 13 U/L — ABNORMAL LOW (ref 14–54)
ANION GAP: 7 (ref 5–15)
AST: 16 U/L (ref 0–37)
AST: 22 U/L (ref 15–41)
Albumin: 4.4 g/dL (ref 3.5–5.2)
BILIRUBIN TOTAL: 0.3 mg/dL (ref 0.2–1.2)
BILIRUBIN TOTAL: 0.3 mg/dL (ref 0.3–1.2)
BUN: 24 mg/dL — AB (ref 6–23)
BUN: 24 mg/dL — AB (ref 6–20)
CALCIUM: 10.2 mg/dL (ref 8.9–10.3)
CO2: 24 meq/L (ref 19–32)
CO2: 22 mmol/L (ref 22–32)
Calcium: 10.9 mg/dL — ABNORMAL HIGH (ref 8.4–10.5)
Chloride: 103 mEq/L (ref 96–112)
Chloride: 105 mmol/L (ref 101–111)
Creatinine, Ser: 1.85 mg/dL — ABNORMAL HIGH (ref 0.40–1.20)
Creatinine, Ser: 1.6 mg/dL — ABNORMAL HIGH (ref 0.44–1.00)
GFR calc Af Amer: 37 mL/min — ABNORMAL LOW (ref >60)
GFR, EST NON AFRICAN AMERICAN: 32 mL/min — AB (ref >60)
GFR: 28.78 mL/min — AB (ref >60.00)
GLUCOSE: 102 mg/dL — AB (ref 70–99)
GLUCOSE: 139 mg/dL — AB (ref 65–99)
Potassium: 4.1 mEq/L (ref 3.5–5.1)
Potassium: 4.2 mmol/L (ref 3.5–5.1)
SODIUM: 134 meq/L — AB (ref 135–145)
Sodium: 134 mmol/L — ABNORMAL LOW (ref 135–145)
TOTAL PROTEIN: 7.4 g/dL (ref 6.0–8.3)
TOTAL PROTEIN: 6.7 g/dL (ref 6.5–8.1)

## 2016-03-01 LAB — URINALYSIS, ROUTINE W REFLEX MICROSCOPIC
BILIRUBIN URINE: NEGATIVE
Glucose, UA: NEGATIVE mg/dL
Hgb urine dipstick: NEGATIVE
KETONES UR: NEGATIVE mg/dL
LEUKOCYTES UA: NEGATIVE
NITRITE: NEGATIVE
PROTEIN: NEGATIVE mg/dL
Specific Gravity, Urine: 1.012 (ref 1.005–1.030)
pH: 6 (ref 5.0–8.0)

## 2016-03-01 LAB — LIPASE, BLOOD: Lipase: 70 U/L — ABNORMAL HIGH (ref 11–51)

## 2016-03-01 MED ORDER — METOCLOPRAMIDE HCL 5 MG/ML IJ SOLN
10.0000 mg | Freq: Once | INTRAMUSCULAR | Status: AC
  Administered 2016-03-01: 10 mg via INTRAVENOUS
  Filled 2016-03-01: qty 2

## 2016-03-01 MED ORDER — SODIUM CHLORIDE 0.9 % IV BOLUS (SEPSIS)
2000.0000 mL | Freq: Once | INTRAVENOUS | Status: AC
  Administered 2016-03-01: 2000 mL via INTRAVENOUS

## 2016-03-01 NOTE — ED Provider Notes (Signed)
[facility]-EMERGENCY DEPT Provider Note   CSN: [id number] Arrival date & time: 03/01/16  1339     History   Chief Complaint No chief complaint on file.   HPI Katrina Rivera is a 71 y.o. female.  Pt states she complains of nausea and her md stated she needed fluids.  Her creatinine was elevated    The history is provided by the patient. No language interpreter was used.  Emesis   This is a recurrent problem. The current episode started more than 2 days ago. Episode frequency: no vomiting. The problem has not changed since onset.There has been no fever. Pertinent negatives include no abdominal pain, no chills, no cough, no diarrhea and no headaches.    Past Medical History:  Diagnosis Date  . Addison's disease (HCC)    takes Solu Cortef daily  . Anemia    takes Ferrous Sulfate daily  . Anxiety    takes Xanax nightly  . Arthritis   . Chronic back pain    stenosis  . Depression    takes Cymbalta daily  . Dizziness    if b/p drops   . Fibromyalgia   . Fibromyalgia   . GERD (gastroesophageal reflux disease)   . Headache(784.0)   . History of blood transfusion    no abnormal reaction noted  . History of bronchitis    many yrs ago   . Hypokalemia    takes Potassium daily  . Hypotension   . Hypotension    takes Florinef daily  . IBS (irritable bowel syndrome)    takes Librarian, academic daily  . Insomnia    takes Trazodone nightly  . Joint pain   . Multiple sclerosis (HCC)    doesn't take any meds  . Multiple sclerosis (HCC)   . Nocturia   . Osteoporosis   . Peripheral neuropathy (HCC)   . Restless leg syndrome   . Seasonal allergies    takes Zyrtec daily;uses Flonase daily as needed  . Syncope   . Urinary frequency    takes Flomax daily  . Weakness    numbness and tingling    Patient Active Problem List   Diagnosis Date Noted  . Somnolence, daytime 10/22/2015  . Fatigue 10/22/2015  . Chronic leg pain 06/29/2015  . S/P lumbar spinal fusion 06/18/2014  .  Abnormality of gait 02/18/2013  . S/P lumbar microdiscectomy 01/24/2013  . Hypokalemia 12/24/2012  . Sweating abnormality 10/19/2012  . Dysautonomia orthostatic hypotension syndrome (HCC) 08/16/2012  . Hereditary and idiopathic peripheral neuropathy 08/16/2012  . Chronic adrenal insufficiency (HCC) 08/16/2012  . MS (multiple sclerosis) (HCC) 08/15/2012    Past Surgical History:  Procedure Laterality Date  . ABDOMINAL HYSTERECTOMY  1988  . APPENDECTOMY  1988  . CESAREAN SECTION  1973/1977   x2  . CHOLECYSTECTOMY  1997  . COLECTOMY  1990  . COLONOSCOPY    . ESOPHAGOGASTRODUODENOSCOPY    . EYE SURGERY     bilateral - /w IOL  . FRACTURE SURGERY Right    rods and screws  . LUMBAR LAMINECTOMY/DECOMPRESSION MICRODISCECTOMY Left 01/24/2013   Procedure: LUMBAR FIVE TO SACRAL ONE LUMBAR LAMINECTOMY/DECOMPRESSION MICRODISCECTOMY 1 LEVEL;  Surgeon: Tia Alert, MD;  Location: [facility];  Service: Neurosurgery;  Laterality: Left;  Marland Kitchen MAXIMUM ACCESS (MAS)POSTERIOR LUMBAR INTERBODY FUSION (PLIF) 1 LEVEL N/A 06/18/2014   Procedure: MAXIMUM ACCESS SURGERY POSTERIOR LUMBAR INTERBODY FUSION LUMBAR FIVE TO SACRAL ONE ;  Surgeon: Tia Alert, MD;  Location: [facility];  Service: Neurosurgery;  Laterality: N/A;    OB History    No data available       Home Medications    Prior to Admission medications   Medication Sig Start Date End Date Taking? Authorizing Provider  alfuzosin (UROXATRAL) 10 MG 24 hr tablet Take 10 mg by mouth daily with breakfast.   Yes Historical Provider, MD  cephALEXin (KEFLEX) 250 MG capsule Take 250 mg by mouth at bedtime.  10/18/15  Yes Historical Provider, MD  Cholecalciferol (VITAMIN D3 SUPER STRENGTH) 2000 units CAPS Take 2,000 Units by mouth daily.    Yes Historical Provider, MD  cyclobenzaprine (FLEXERIL) 10 MG tablet Take 10 mg by mouth 3 (three) times daily as needed for muscle spasms. Reported on 03/13/2015   Yes Historical Provider, MD  Ferrous Sulfate Dried  200 (65 FE) MG TABS Take 65 mg by mouth daily.   Yes Historical Provider, MD  fludrocortisone (FLORINEF) 0.1 MG tablet One tablet in the morning and two tablets at night. Patient taking differently: Take 0.1 mg by mouth daily. One tablet in the morning and two tablets at night. 07/07/15  Yes Reather Littler, MD  hydrocortisone sodium succinate (SOLU-CORTEF) 100 MG SOLR injection Inject  0.4 mls every morning and 0.57mls every evening. Patient taking differently: Inject 100 mg into the muscle daily. Inject  0.4 mls every morning and 0.82mls every evening. 02/26/16  Yes Reather Littler, MD  Polyethyl Glycol-Propyl Glycol (SYSTANE OP) Apply 2 drops to eye daily as needed (for dry eyes).   Yes Historical Provider, MD  potassium chloride (K-DUR,KLOR-CON) 10 MEQ tablet Take 2 in the morning and two at bedtime. Patient taking differently: Take 10 mEq by mouth at bedtime. Take 2 in the morning and two at bedtime. 07/07/15  Yes Reather Littler, MD  promethazine (PHENERGAN) 12.5 MG tablet Take 1 tablet (12.5 mg total) by mouth every 8 (eight) hours as needed for nausea or vomiting. 10/15/14  Yes Reather Littler, MD  BD INSULIN SYRINGE ULTRAFINE 31G X 15/64" 0.5 ML MISC USE TWICE DAILY. 04/24/15   Reather Littler, MD  Insulin Syringe-Needle U-100 (INSULIN SYRINGE .5CC/31GX5/16") 31G X 5/16" 0.5 ML MISC Use 2 per day to inject Hydrocortisone 10/13/15   Reather Littler, MD    Family History Family History  Problem Relation Age of Onset  . Hypertension Mother   . Stroke Mother   . Heart attack Father     Social History Social History  Substance Use Topics  . Smoking status: Never Smoker  . Smokeless tobacco: Never Used  . Alcohol use No     Allergies   Patient has no known allergies.   Review of Systems Review of Systems  Constitutional: Negative for appetite change, chills and fatigue.  HENT: Negative for congestion, ear discharge and sinus pressure.   Eyes: Negative for discharge.  Respiratory: Negative for cough.     Cardiovascular: Negative for chest pain.  Gastrointestinal: Positive for nausea. Negative for abdominal pain and diarrhea.  Genitourinary: Negative for frequency and hematuria.  Musculoskeletal: Negative for back pain.  Skin: Negative for rash.  Neurological: Negative for seizures and headaches.  Psychiatric/Behavioral: Negative for hallucinations.     Physical Exam Updated Vital Signs BP 175/74   Pulse 86   Temp 98 F (36.7 C) (Oral)   Resp 16   SpO2 100%   Physical Exam  Constitutional: She is oriented to person, place, and time. She appears well-developed.  HENT:  Head: Normocephalic.  Eyes: Conjunctivae and EOM are normal. No scleral icterus.  Neck: Neck supple. No thyromegaly present.  Cardiovascular: Normal rate and regular rhythm.  Exam reveals no gallop and no friction rub.   No murmur heard. Pulmonary/Chest: No stridor. She has no wheezes. She has no rales. She exhibits no tenderness.  Abdominal: She exhibits no distension. There is no tenderness. There is no rebound.  Musculoskeletal: Normal range of motion. She exhibits no edema.  Lymphadenopathy:    She has no cervical adenopathy.  Neurological: She is oriented to person, place, and time. She exhibits normal muscle tone. Coordination normal.  Skin: No rash noted. No erythema.  Psychiatric: She has a normal mood and affect. Her behavior is normal.     ED Treatments / Results  Labs (all labs ordered are listed, but only abnormal results are displayed) Labs Reviewed  LIPASE, BLOOD - Abnormal; Notable for the following:       Result Value   Lipase 70 (*)    All other components within normal limits  COMPREHENSIVE METABOLIC PANEL - Abnormal; Notable for the following:    Sodium 134 (*)    Glucose, Bld 139 (*)    BUN 24 (*)    Creatinine, Ser 1.60 (*)    ALT 13 (*)    GFR calc non Af Amer 32 (*)    GFR calc Af Amer 37 (*)    All other components within normal limits  CBC - Abnormal; Notable for the  following:    RBC 3.67 (*)    Hemoglobin 10.2 (*)    HCT 32.1 (*)    All other components within normal limits  URINALYSIS, ROUTINE W REFLEX MICROSCOPIC - Abnormal; Notable for the following:    Color, Urine STRAW (*)    All other components within normal limits    EKG  EKG Interpretation None       Radiology No results found.  Procedures Procedures (including critical care time)  Medications Ordered in ED Medications  sodium chloride 0.9 % bolus 2,000 mL (2,000 mLs Intravenous New Bag/Given 03/01/16 1728)  metoCLOPramide (REGLAN) injection 10 mg (10 mg Intravenous Given 03/01/16 1751)     Initial Impression / Assessment and Plan / ED Course  I have reviewed the triage vital signs and the nursing notes.  Pertinent labs & imaging results that were available during my care of the patient were reviewed by me and considered in my medical decision making (see chart for details).     Pt with nausea and dehydration.  Pt received 2 liters of fluids.   Patient will follow-up with her physician this week  Final Clinical Impressions(s) / ED Diagnoses   Final diagnoses:  Dehydration    New Prescriptions New Prescriptions   No medications on file     Bethann Berkshire, MD 03/01/16 1950

## 2016-03-01 NOTE — ED Triage Notes (Signed)
Pt was called today to come to ED since her renal function is worsening and persistent nausea.

## 2016-03-01 NOTE — Discharge Instructions (Signed)
Follow up with your md later this week.  Drink plenty of fluids

## 2016-03-01 NOTE — ED Triage Notes (Signed)
Sent here from Dr. Remus Blake office for admission.

## 2016-03-01 NOTE — Telephone Encounter (Signed)
Left voice mail to let patient know she was approved for solu-cortef from 03/01/16 until 02/06/17- asked for a return call if she had any questions

## 2016-03-01 NOTE — Progress Notes (Signed)
Patient ID: Katrina Rivera, female   DOB: 14-Sep-1945, 71 y.o.   MRN: [id number]   Subjective:         Chief complaint: Follow-up    PROBLEM 1: Dysautonomic orthostatic hypotension   PAST history: She has had long-standing problems with orthostatic hypotension and also hyponatremia. Has been diagnosed with dysautonomia and has multiple other problems related to autonomic neuropathy.   She has been on Florinef since 2004 previously taking 3 tablets daily along with 5 tablets of potassium which had previously controlled her symptoms well . Because of persistent orthostatic symptoms she had been tried on midodrine on 05/28/12 but this was later stopped when blood pressure increased.  She  had medication adjustments done frequently over the last year for regulating her blood pressure.  RECENT history:   She has previously been on variable doses of Florinef, as much as 3 a day She is more recently taking 1 tablet twice a day She increase the dose on her own in September because she thought her blood pressure was getting low and does have a list of her blood pressure readings which were only at times around 101's below 100 systolic standing  She has checked her blood pressure only Sporadically in the last 2-3 weeks and this has been variable, mostly high  She says she feels nauseated for the last couple of weeks while she was having the sinus infection    Hypokalemia:  She is still taking 2 tablets of potassium twice a day as directed last month   Lab Results  Component Value Date   CREATININE 1.52 (H) 01/20/2016   BUN 22 01/20/2016   NA 141 01/20/2016   K 3.2 (L) 01/20/2016   CL 99 01/20/2016   CO2 35 (H) 01/20/2016    HYPERCALCEMIA:  This is new and she usually has a normal calcium.   She is taking vitamin D supplements and is on treatment with Prolia for osteoporosis from rheumatologist Not taking any extra calcium  Lab Results  Component Value Date   CALCIUM  10.9 (H) 01/20/2016   CALCIUM 8.9 10/08/2015   CALCIUM 9.3 07/08/2015   CALCIUM 9.7 04/10/2015   CALCIUM 10.4 03/09/2015   CALCIUM 10.0 12/09/2014   CALCIUM 9.4 10/09/2014   CALCIUM 9.3 08/08/2014   CALCIUM 9.8 07/14/2014     Adrenal Insufficiency:   This is secondary to pituitary dysfunction  Prior testing included Cortrosyn test showing stimulated level of 15.7, baseline 3.9.  Also confirmed by 24 hour urine free cortisol which was only 3.  Although previously she had tolerated oral hydrocortisone and small doses with improvement in her symptoms she did not continue this long-term She was again symptomatic with weight loss, decreased appetite, nausea and also diarrhea Cortrosyn stimulation test on 03/12/14 showed baseline cortisol level of 0.3 and post injection of 1.1 only  She had GI side effects from hydrocortisone , methylprednisolone and prednisone and was given hydrocortisone injections using insulin syringe  Currently is on  hydrocortisone injections    RECENT HISTORY: She has been on injectable hydrocortisone because of GI side effects from oral formulations   She did initially feel better when her morning dose was increased to 20 mg hydrocortisone previously Currently taking 20 mg and after waking up and 10 mg around 5 PM, She is getting the injections from her husband   Because of her having a sinus infection she got a Solu-Medrol injection last Friday from her PCP However she is complaining of  decreased appetite and feeling weak and nauseated, not better with the injection She is mostly drinking liquids Not able to eat much recently   Wt Readings from Last 3 Encounters:  03/01/16 107 lb (48.5 kg)  01/21/16 116 lb (52.6 kg)  10/22/15 113 lb (51.3 kg)    General Endocrinology:  ?  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 Always use your most recent med list.          alfuzosin 10 MG 24 hr tablet Commonly known as:  UROXATRAL Take 10 mg by mouth daily with breakfast.   BD INSULIN SYRINGE ULTRAFINE 31G X 15/64" 0.5 ML Misc Generic drug:  Insulin Syringe-Needle U-100 USE TWICE DAILY.   INSULIN SYRINGE .5CC/31GX5/16" 31G X 5/16" 0.5 ML Misc Use 2 per day to inject Hydrocortisone   cephALEXin 250 MG capsule Commonly known as:  KEFLEX Take 250 mg by mouth at bedtime.   DULoxetine 30 MG capsule Commonly known as:  CYMBALTA   Ferrous Sulfate Dried 200 (65 Fe) MG Tabs Take 65 mg by mouth daily.   FLEXERIL 10 MG tablet Generic drug:  cyclobenzaprine Take 10 mg by mouth 3 (three) times daily as needed for muscle spasms. Reported on 03/13/2015   fludrocortisone 0.1 MG tablet Commonly known as:  FLORINEF One tablet in the morning and two tablets at night.   fluticasone 50 MCG/ACT nasal spray Commonly known as:  FLONASE Place 2 sprays into both nostrils at bedtime as needed for allergies or rhinitis.   gabapentin 600 MG tablet Commonly known as:  NEURONTIN Take 600 mg by mouth 3 (three) times daily.   hydrocortisone sodium succinate 100 MG Solr injection Commonly known as:  SOLU-CORTEF Inject  0.4 mls every morning and 0.75mls every evening.   NONFORMULARY OR COMPOUNDED ITEM Apply 1 application topically daily as needed (for pain). Meloxicam 0.5%, Gabapentin 6%, Lidocaine 2%, Prilocaine 2%   omeprazole 40 MG capsule Commonly known as:  PRILOSEC Take 40 mg by mouth  daily.   potassium chloride 10 MEQ tablet Commonly known as:  K-DUR,KLOR-CON Take 2 in the morning and two at bedtime.   promethazine 12.5 MG tablet Commonly known as:  PHENERGAN Take 1 tablet (12.5 mg total) by mouth every 8 (eight) hours as needed for nausea or vomiting.   SYSTANE OP Apply 2 drops to eye daily as needed (for dry eyes).   VITAMIN D3 SUPER STRENGTH 2000 units Caps Generic drug:  Cholecalciferol Take 2,000 Units by mouth daily.       Review of Systems   NAUSEA: This has been Worse as above  FATIGUE and difficulty sleeping: She continues to have  Insomnia, now taking trazodone  Also on Cymbalta now  Increased sweating:  Previously had significant sweating episodes but these are better  Genitourinary: She has had difficulty  emptying her bladder and is being treated by urologist with various medications,  She was evaluated by urologist for abnormal renal function but no urinary obstruction 5    Objective:   Physical Exam  BP 140/84   Pulse 92   Ht 5' (1.524 m)   Wt 107 lb (48.5 kg)   SpO2 96%   BMI 20.90 kg/m   Blood pressure  Standing was 118/70 with increased pulse  mucous membranes are dry  No pedal edema  Assessment:      RENAL dysfunction:Needs follow-up   Adrenal insufficiency:  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 and very low cortisol levels on the stimulation test Since she did not tolerate oral medications she is taking injectable hydrocortisone twice a day, using 0.4 mL in the morning and 0.2 mL in the evening   Although she has signs and symptoms of adrenal insufficiency she did not subjectively feel better with a Solu-Medrol injection on Friday    Orthostatic dysautonomic hypotension syndrome:   Her blood pressure appears to be relatively lower and she can take an extra Florinef today Also she appears to be somewhat dehydrated  HYPERCALCEMIA: Will recheck this Check PTH if still  high  Plan:       Check chemistry panel If she still has significant renal dysfunction will send her for admission Meanwhile she can double up on her hydrocortisone until she was better Take extra Florinef today Her insurance requires prior authorization and 3 pages of authorization were filled out today  There are no Patient Instructions on file for this visit. Marland Kitchen  Sherrey [place]  Note: This office note was prepared with Insurance underwriter. Any transcriptional errors that result from this process are unintentional.

## 2016-03-02 ENCOUNTER — Telehealth: Payer: Self-pay

## 2016-03-02 NOTE — Telephone Encounter (Signed)
She should double up on the Solu-Cortef only today and tomorrow. Since her problem with dehydration was related to decreased intake she needs to follow-up with her primary care doctor this week

## 2016-03-02 NOTE — Telephone Encounter (Signed)
Spoke with the patient and she stated an understanding of directions-

## 2016-03-02 NOTE — Telephone Encounter (Signed)
Patient calling to find out if she is still supposed to double up on the solu-cortef or not and she was at the ED and was told to come back here for more blood work by the end of this week since she was on IV fluids so she wants to know if you want to see her again this week

## 2016-03-03 DIAGNOSIS — E860 Dehydration: Secondary | ICD-10-CM | POA: Diagnosis not present

## 2016-03-03 DIAGNOSIS — R110 Nausea: Secondary | ICD-10-CM | POA: Diagnosis not present

## 2016-03-03 LAB — PARATHYROID HORMONE, INTACT (NO CA): PTH: 31 pg/mL (ref 15–65)

## 2016-03-07 NOTE — Telephone Encounter (Signed)
Katrina Rivera  From Dr Reola Calkins office called to see what labs was drawn on patient, she has a dr appt with tomorrow. [phone]

## 2016-03-08 ENCOUNTER — Telehealth: Payer: Self-pay | Admitting: Endocrinology

## 2016-03-08 NOTE — Telephone Encounter (Signed)
She has been lightheaded on standing up for the last 4-5 days Standing blood pressure is about 104-117 with increased pulse, sitting blood pressure 130 Advised her to increase Florinef to 2 tablets instead of 1

## 2016-03-08 NOTE — Telephone Encounter (Signed)
Spoke with [facility] and faxed the requested labs

## 2016-03-08 NOTE — Telephone Encounter (Signed)
Pt wanted to make Dr. Lucianne Muss aware that whenever she stands up she is extremely dizzy and her pulse rate is going over 100.

## 2016-03-09 NOTE — Telephone Encounter (Signed)
PA submitted, will await response 

## 2016-03-10 ENCOUNTER — Ambulatory Visit (INDEPENDENT_AMBULATORY_CARE_PROVIDER_SITE_OTHER): Payer: PPO | Admitting: Endocrinology

## 2016-03-10 ENCOUNTER — Encounter: Payer: Self-pay | Admitting: Endocrinology

## 2016-03-10 ENCOUNTER — Other Ambulatory Visit: Payer: Self-pay | Admitting: Endocrinology

## 2016-03-10 VITALS — BP 140/90 | HR 109 | Ht 60.0 in | Wt 111.0 lb

## 2016-03-10 DIAGNOSIS — R531 Weakness: Secondary | ICD-10-CM

## 2016-03-10 DIAGNOSIS — D649 Anemia, unspecified: Secondary | ICD-10-CM

## 2016-03-10 DIAGNOSIS — E2749 Other adrenocortical insufficiency: Secondary | ICD-10-CM | POA: Diagnosis not present

## 2016-03-10 DIAGNOSIS — N289 Disorder of kidney and ureter, unspecified: Secondary | ICD-10-CM

## 2016-03-10 LAB — COMPREHENSIVE METABOLIC PANEL WITH GFR
ALT: 16 U/L (ref 0–35)
AST: 18 U/L (ref 0–37)
Albumin: 4.3 g/dL (ref 3.5–5.2)
Alkaline Phosphatase: 39 U/L (ref 39–117)
BUN: 17 mg/dL (ref 6–23)
CO2: 29 meq/L (ref 19–32)
Calcium: 11.2 mg/dL — ABNORMAL HIGH (ref 8.4–10.5)
Chloride: 103 meq/L (ref 96–112)
Creatinine, Ser: 1.6 mg/dL — ABNORMAL HIGH (ref 0.40–1.20)
GFR: 34.02 mL/min — ABNORMAL LOW
Glucose, Bld: 118 mg/dL — ABNORMAL HIGH (ref 70–99)
Potassium: 4.7 meq/L (ref 3.5–5.1)
Sodium: 137 meq/L (ref 135–145)
Total Bilirubin: 0.3 mg/dL (ref 0.2–1.2)
Total Protein: 6.4 g/dL (ref 6.0–8.3)

## 2016-03-10 LAB — CBC WITH DIFFERENTIAL/PLATELET
Basophils Absolute: 0 K/uL (ref 0.0–0.1)
Basophils Relative: 0.2 % (ref 0.0–3.0)
Eosinophils Absolute: 0 K/uL (ref 0.0–0.7)
Eosinophils Relative: 0.4 % (ref 0.0–5.0)
HCT: 31.6 % — ABNORMAL LOW (ref 36.0–46.0)
Hemoglobin: 10.4 g/dL — ABNORMAL LOW (ref 12.0–15.0)
Lymphocytes Relative: 11.7 % — ABNORMAL LOW (ref 12.0–46.0)
Lymphs Abs: 1.3 K/uL (ref 0.7–4.0)
MCHC: 33 g/dL (ref 30.0–36.0)
MCV: 86.8 fl (ref 78.0–100.0)
Monocytes Absolute: 0.7 K/uL (ref 0.1–1.0)
Monocytes Relative: 5.9 % (ref 3.0–12.0)
Neutro Abs: 9.3 K/uL — ABNORMAL HIGH (ref 1.4–7.7)
Neutrophils Relative %: 81.8 % — ABNORMAL HIGH (ref 43.0–77.0)
Platelets: 283 K/uL (ref 150.0–400.0)
RBC: 3.64 Mil/uL — ABNORMAL LOW (ref 3.87–5.11)
RDW: 13.5 % (ref 11.5–15.5)
WBC: 11.4 K/uL — ABNORMAL HIGH (ref 4.0–10.5)

## 2016-03-10 LAB — IBC PANEL
Iron: 38 ug/dL — ABNORMAL LOW (ref 42–145)
SATURATION RATIOS: 7.8 % — AB (ref 20.0–50.0)
TRANSFERRIN: 347 mg/dL (ref 212.0–360.0)

## 2016-03-10 LAB — VITAMIN B12: VITAMIN B 12: 1190 pg/mL — AB (ref 211–911)

## 2016-03-10 NOTE — Progress Notes (Signed)
Patient ID: Katrina Rivera, female   DOB: [birth year], 71 y.o.   MRN: [id number]   Subjective:         Chief complaint: Follow-up Of renal insufficiency/secondary adrenal insufficiency   PROBLEM 1: Dysautonomic orthostatic hypotension   PAST history: She has had long-standing problems with orthostatic hypotension and also hyponatremia. Has been diagnosed with dysautonomia and has multiple other problems related to autonomic neuropathy.   She has been on Florinef since 2004 previously taking 3 tablets daily along with 5 tablets of potassium which had previously controlled her symptoms well . Because of persistent orthostatic symptoms she had been tried on midodrine on 05/28/12 but this was later stopped when blood pressure increased.  She  had medication adjustments done frequently over the last year for regulating her blood pressure.  RECENT history:   She has previously been on variable doses of Florinef, as much as 3 a day  Last weekend she was complaining of feeling lightheaded and 2 days ago she felt that her blood pressure was lower standing up with systolic around 104-117 She was told to increase her Florinef to twice a day  She thinks her lightheadedness improved a little but still is feeling lightheaded including today Her blood pressure standing up has been 110 more recently and improving  She says she feels she cannot stand for long and feels weak, dizzy and somewhat off-balance when getting up Does not feel swimmy headedness when sitting down or lying down    Hypokalemia:  She is  taking 2 tablets of potassium twice a day as directed    Lab Results  Component Value Date   CREATININE 1.60 (H) 03/01/2016   BUN 24 (H) 03/01/2016   NA 134 (L) 03/01/2016   K 4.2 03/01/2016   CL 105 03/01/2016   CO2 22 03/01/2016    HYPERCALCEMIA:  This is new and she usually has a normal calcium.   She is taking vitamin D supplements and is on treatment with Prolia for  osteoporosis from rheumatologist Not taking any extra calcium orally Last calcium was normal  Her PTH level is 31  Lab Results  Component Value Date   CALCIUM 10.2 03/01/2016   CALCIUM 10.9 (H) 03/01/2016   CALCIUM 10.9 (H) 01/20/2016   CALCIUM 8.9 10/08/2015   CALCIUM 9.3 07/08/2015   CALCIUM 9.7 04/10/2015   CALCIUM 10.4 03/09/2015   CALCIUM 10.0 12/09/2014   CALCIUM 9.4 10/09/2014     Adrenal Insufficiency:   This is secondary to pituitary dysfunction  Prior testing included Cortrosyn test showing stimulated level of 15.7, baseline 3.9.  Also confirmed by 24 hour urine free cortisol which was only 3.  Although previously she had tolerated oral hydrocortisone and small doses with improvement in her symptoms she did not continue this long-term She was again symptomatic with weight loss, decreased appetite, nausea and also diarrhea Cortrosyn stimulation test on 03/12/14 showed baseline cortisol level of 0.3 and post injection of 1.1 only  She had GI side effects from hydrocortisone , methylprednisolone and prednisone and was given hydrocortisone injections using insulin syringe  Currently is on  hydrocortisone injections    RECENT HISTORY: She has been on injectable hydrocortisone because of GI side effects from oral formulations   She did initially feel better when her morning dose was increased to 20 mg hydrocortisone previously Currently taking 20 mg and after waking up and 10 mg around 5 PM, She is getting the injections from her husband  She was referred for hydration on 1/23 because of poor intake Subsequently was told to take extra hydrocortisone,-dose for 2 days and she felt better overall She says that her appetite is coming back but still having some nausea   Wt Readings from Last 3 Encounters:  03/10/16 111 lb (50.3 kg)  03/01/16 107 lb (48.5 kg)  01/21/16 116 lb (52.6 kg)    General Endocrinology:  ?  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 Always use your most recent med list.          alfuzosin 10 MG 24 hr tablet Commonly known as:  UROXATRAL Take 10 mg by mouth daily with breakfast.   BD INSULIN SYRINGE ULTRAFINE 31G X 15/64" 0.5 ML Misc Generic drug:  Insulin Syringe-Needle U-100 USE TWICE DAILY.   INSULIN SYRINGE .5CC/31GX5/16" 31G X 5/16" 0.5 ML Misc Use 2 per day to inject Hydrocortisone   cephALEXin 250 MG capsule Commonly known as:  KEFLEX Take 250 mg by mouth at bedtime.   Ferrous Sulfate Dried 200 (65 Fe) MG Tabs Take 65 mg by mouth daily.   FLEXERIL 10 MG tablet Generic drug:  cyclobenzaprine Take 10 mg by mouth 3 (three) times daily as needed for muscle spasms. Reported on 03/13/2015   fludrocortisone 0.1 MG tablet Commonly known as:  FLORINEF One tablet in the morning and two tablets at night.   hydrocortisone sodium succinate 100 MG Solr injection Commonly known as:  SOLU-CORTEF Inject  0.4 mls every morning and 0.66mls every evening.   potassium chloride 10 MEQ tablet Commonly known as:  K-DUR,KLOR-CON Take 2 in the morning and two at bedtime.   promethazine 12.5 MG tablet Commonly known as:  PHENERGAN Take 1 tablet (12.5 mg total) by mouth every 8 (eight) hours as needed for nausea or vomiting.   SYSTANE OP Apply 2 drops to eye daily as needed (for dry eyes).   traZODone 100 MG tablet Commonly known as:  DESYREL 100 mg. Takes 2 tablets at bed time    VITAMIN D3 SUPER STRENGTH 2000 units Caps Generic drug:  Cholecalciferol Take 2,000 Units by mouth daily.       Review of Systems   NAUSEA: This has been worse Until recently and is slightly better, she takes ginger ale when she feels sick  FATIGUE and difficulty sleeping: She continues to have  Insomnia, now taking trazodone  Also on Cymbalta   Increased sweating:  Previously had significant sweating episodes but these are better  Genitourinary: She has had difficulty emptying her bladder and is being treated by urologist with various medications     Objective:   Physical Exam  BP 140/90   Pulse (!) 109   Ht 5' (1.524 m)   Wt 111 lb (50.3 kg)   SpO2 98%   BMI 21.68 kg/m  Blood pressure  Standing was 120/74  mucous membranes are Moist  No pedal edema  Assessment:      RENAL dysfunction: May have been related to dehydration She is doing better with her fluid intake now and getting at least 8-9 cups of fluid a day Needs follow-up levels today   Adrenal insufficiency:  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 and very low cortisol levels on the stimulation test Since she did not tolerate oral medications she is taking injectable hydrocortisone twice a day, using 0.4 mL in the morning and 0.2 mL in the evening   Currently she is feeling somewhat nauseated and weak and will have her take double the dose for another 2-3 days for stress   Orthostatic dysautonomic hypotension syndrome:   Her blood pressure appears to be improving with increasing Florinef to 2 tablets  LIGHTHEADEDNESS/weakness: Not clear if this is related to adrenal insufficiency and weaknesses out of proportion to her blood pressure Will also check her for worsening anemia as her last hemoglobin in the ER was 10.2; she has not taken iron recently because of nausea Also not clear if her feeling of dizziness is neurological, does not appear to be typical vertigo  May  need to follow-up with PCP or neurologist   HYPERCALCEMIA: Likely has mild hyperparathyroidism but asymptomatic Will need to recheck bone density electively    Plan:      As above Will call patient after labs are available today    There are no Patient Instructions on file for this visit. Marland Kitchen  Jassen Sarver  Note: This office note was prepared with Insurance underwriter. Any transcriptional errors that result from this process are unintentional.

## 2016-03-10 NOTE — Patient Instructions (Signed)
Double inj for 2 days

## 2016-03-10 NOTE — Telephone Encounter (Signed)
Patient ask you to send her lab results to Dr Luci Bank (Sports Medicine)  [phone]   Phone#                                             Fax #

## 2016-03-11 NOTE — Telephone Encounter (Signed)
Pt called and said that her PCP does not do the IV Iron supplement and she would like a call back as to what she needs to do moving forward.

## 2016-03-14 NOTE — Telephone Encounter (Signed)
Explained to the patient that she needs the iron infusion at the short stay center and she will check with her PCP again

## 2016-03-14 NOTE — Telephone Encounter (Signed)
Patient ask you to give her a call °

## 2016-03-16 NOTE — Telephone Encounter (Signed)
Patient stated she haven't heard anything back from her PCP concerning her iron infusion, she is still feeling bad and don't know what to do.

## 2016-03-17 ENCOUNTER — Ambulatory Visit: Payer: PPO | Admitting: Endocrinology

## 2016-03-18 ENCOUNTER — Telehealth: Payer: Self-pay

## 2016-03-18 NOTE — Telephone Encounter (Signed)
BP sitting 165/85 standing 136/65 now

## 2016-03-18 NOTE — Telephone Encounter (Signed)
Referral has been made, please check with referral Department if nephrology is setting up an appointment

## 2016-03-18 NOTE — Telephone Encounter (Signed)
Spoke with patient and she does not know when she will be getting this infusion because her PCP is supposed to be setting it up but she has not heard anything- also patient stated that her bp has been elevated and has been feeling dizzy when she stands up and she is feeling really bad please advise

## 2016-03-18 NOTE — Telephone Encounter (Signed)
Discussed with patient.  Her blood pressure was 160 recently and she is back on 1 Florinef daily.  I asked her to check her blood pressure standing and let us know if it is abnormal.  She will also ask her PCP about iron infusion

## 2016-03-18 NOTE — Telephone Encounter (Signed)
Patient would like to know if you plan on sending her to specialist because of kidney function if it is serious she would like to be referred to specialist - please advise

## 2016-03-21 ENCOUNTER — Ambulatory Visit (HOSPITAL_COMMUNITY)
Admission: RE | Admit: 2016-03-21 | Discharge: 2016-03-21 | Disposition: A | Discharge status: Home / Self Care | Payer: PPO | Source: Ambulatory Visit | Attending: Family Medicine | Admitting: Family Medicine

## 2016-03-21 DIAGNOSIS — D509 Iron deficiency anemia, unspecified: Secondary | ICD-10-CM | POA: Diagnosis not present

## 2016-03-21 MED ORDER — SODIUM CHLORIDE 0.9 % IV SOLN
25.0000 mg | Freq: Once | INTRAVENOUS | Status: AC
  Administered 2016-03-21: 25 mg via INTRAVENOUS
  Filled 2016-03-21: qty 0.5

## 2016-03-21 MED ORDER — SODIUM CHLORIDE 0.9 % IV SOLN
1000.0000 mg | Freq: Once | INTRAVENOUS | Status: AC
  Administered 2016-03-21: 1000 mg via INTRAVENOUS
  Filled 2016-03-21: qty 20

## 2016-03-21 NOTE — Telephone Encounter (Signed)
I contacted the patient and advised of message via voicemail. Requested a call back if the patient would like to discuss further.  

## 2016-03-21 NOTE — Progress Notes (Signed)
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 Patient tolerated procedure well with no reaction. Patient alert oriented, and ambulatory at time of discharge. Discharge instructions given to patient and she states an understanding.

## 2016-03-21 NOTE — Telephone Encounter (Signed)
Please confirm that she is taking one tablet daily of the fludrocortisone.  She can skip one dose for now and otherwise continue same dose

## 2016-03-29 ENCOUNTER — Other Ambulatory Visit (INDEPENDENT_AMBULATORY_CARE_PROVIDER_SITE_OTHER): Payer: PPO

## 2016-03-29 DIAGNOSIS — R531 Weakness: Secondary | ICD-10-CM

## 2016-03-29 DIAGNOSIS — D509 Iron deficiency anemia, unspecified: Secondary | ICD-10-CM | POA: Diagnosis not present

## 2016-03-29 DIAGNOSIS — E2749 Other adrenocortical insufficiency: Secondary | ICD-10-CM | POA: Diagnosis not present

## 2016-03-29 DIAGNOSIS — R000 Tachycardia, unspecified: Secondary | ICD-10-CM | POA: Diagnosis not present

## 2016-03-29 LAB — BASIC METABOLIC PANEL
BUN: 21 mg/dL (ref 6–23)
CHLORIDE: 102 meq/L (ref 96–112)
CO2: 26 meq/L (ref 19–32)
CREATININE: 1.11 mg/dL (ref 0.40–1.20)
Calcium: 10.5 mg/dL (ref 8.4–10.5)
GFR: 51.88 mL/min — ABNORMAL LOW (ref >60.00)
GLUCOSE: 132 mg/dL — AB (ref 70–99)
POTASSIUM: 4.7 meq/L (ref 3.5–5.1)
Sodium: 135 mEq/L (ref 135–145)

## 2016-03-29 LAB — TSH: TSH: 0.72 u[IU]/mL (ref 0.35–4.50)

## 2016-03-29 LAB — CBC
HCT: 31.6 % — ABNORMAL LOW (ref 36.0–46.0)
Hemoglobin: 10.6 g/dL — ABNORMAL LOW (ref 12.0–15.0)
MCHC: 33.7 g/dL (ref 30.0–36.0)
MCV: 88.9 fl (ref 78.0–100.0)
PLATELETS: 261 10*3/uL (ref 150.0–400.0)
RBC: 3.55 Mil/uL — AB (ref 3.87–5.11)
RDW: 16.2 % — ABNORMAL HIGH (ref 11.5–15.5)
WBC: 9.9 10*3/uL (ref 4.0–10.5)

## 2016-03-29 LAB — T4, FREE: Free T4: 0.74 ng/dL (ref 0.60–1.60)

## 2016-04-01 ENCOUNTER — Ambulatory Visit (INDEPENDENT_AMBULATORY_CARE_PROVIDER_SITE_OTHER): Payer: PPO | Admitting: Endocrinology

## 2016-04-01 ENCOUNTER — Encounter: Payer: Self-pay | Admitting: Endocrinology

## 2016-04-01 VITALS — BP 98/56 | HR 76 | Ht 60.0 in | Wt 112.0 lb

## 2016-04-01 DIAGNOSIS — M810 Age-related osteoporosis without current pathological fracture: Secondary | ICD-10-CM | POA: Diagnosis not present

## 2016-04-01 DIAGNOSIS — E559 Vitamin D deficiency, unspecified: Secondary | ICD-10-CM | POA: Diagnosis not present

## 2016-04-01 DIAGNOSIS — M791 Myalgia: Secondary | ICD-10-CM | POA: Diagnosis not present

## 2016-04-01 DIAGNOSIS — I951 Orthostatic hypotension: Secondary | ICD-10-CM

## 2016-04-01 DIAGNOSIS — G903 Multi-system degeneration of the autonomic nervous system: Secondary | ICD-10-CM

## 2016-04-01 NOTE — Progress Notes (Signed)
Patient ID: Katrina Rivera, female   DOB: Aug 17, 1945, 71 y.o.   MRN: [id number]   Subjective:         Chief complaint: Follow-up For various issues   PROBLEM 1: Dysautonomic orthostatic hypotension   PAST history: She has had long-standing problems with orthostatic hypotension and also hyponatremia. Has been diagnosed with dysautonomia and has multiple other problems related to autonomic neuropathy.   She has been on Florinef since 2004 previously taking 3 tablets daily along with 5 tablets of potassium which had previously controlled her symptoms well . Because of persistent orthostatic symptoms she had been tried on midodrine on 05/28/12 but this was later stopped when blood pressure increased.  She  had medication adjustments done frequently over the last year for regulating her blood pressure.  RECENT history:   She has previously been on variable doses of Florinef, as much as 3 a day  She was told to increase her Florinef to twice a day before her last visit However about 2 weeks ago her blood pressure started going up at home and she was told to cut back on Florinef back to 1 a day  Her blood pressure standing up has been fairly close to normal, yesterday about 110 systolic  She says she feels she cannot stand for long and feels weak but is not lightheaded    Hypokalemia: Controlled. She is  taking 2 tablets of potassium twice a day as directed    Lab Results  Component Value Date   CREATININE 1.11 03/29/2016   BUN 21 03/29/2016   NA 135 03/29/2016   K 4.7 03/29/2016   CL 102 03/29/2016   CO2 26 03/29/2016    HYPERCALCEMIA:  Calcium level had increased along with her renal function deteriorating Last calcium was normal  Her PTH level is 31  Lab Results  Component Value Date   CALCIUM 10.5 03/29/2016   CALCIUM 11.2 (H) 03/10/2016   CALCIUM 10.2 03/01/2016   CALCIUM 10.9 (H) 03/01/2016   CALCIUM 10.9 (H) 01/20/2016   CALCIUM 8.9 10/08/2015   CALCIUM 9.3 07/08/2015   CALCIUM 9.7 04/10/2015   CALCIUM 10.4 03/09/2015     Adrenal Insufficiency:   This is secondary to pituitary dysfunction  Prior testing included Cortrosyn test showing stimulated level of 15.7, baseline 3.9.  Also confirmed by 24 hour urine free cortisol which was only 3.  Although previously she had tolerated oral hydrocortisone and small doses with improvement in her symptoms she did not continue this long-term She was again symptomatic with weight loss, decreased appetite, nausea and also diarrhea Cortrosyn stimulation test on 03/12/14 showed baseline cortisol level of 0.3 and post injection of 1.1 only  She had GI side effects from hydrocortisone , methylprednisolone and prednisone and was given hydrocortisone injections using insulin syringe  Currently is on  hydrocortisone injections    RECENT HISTORY: She has been on injectable hydrocortisone because of GI side effects from oral formulations   She did initially feel better when her morning dose was increased to 20 mg hydrocortisone previously Currently taking 20 mg and after waking up and 10 mg around 5 PM, She is getting the injections from her husband   Even when she started an extra dose after her last visit she did not feel any better with her weakness She says that her appetite is Normal, she has less nausea However despite her weight stabilizing she does feel weak and is not able to do any significant physical  activity   Wt Readings from Last 3 Encounters:  04/01/16 112 lb (50.8 kg)  03/10/16 111 lb (50.3 kg)  03/01/16 107 lb (48.5 kg)    General Endocrinology:  ?  Hypopituitarism: She has had a low normal free T4 in the past  but did not tolerate thyroxine supplementation in the past which were tried because of her continued symptoms of fatigue Free T4 has been  consistently normal  TSH is also normal Has not had any abnormalities of the pituitary on previous MRI of the brain  Lab Results    Component Value Date   TSH 0.72 03/29/2016   TSH 1.67 10/08/2015   TSH 1.01 03/09/2015   FREET4 0.74 03/29/2016   FREET4 0.74 10/08/2015   FREET4 0.88 03/09/2015      Allergies as of 04/01/2016   No Known Allergies     Medication List       Accurate as of 04/01/16 10:12 AM. Always use your most recent med list.          alfuzosin 10 MG 24 hr tablet Commonly known as:  UROXATRAL Take 10 mg by mouth daily with breakfast.   BD INSULIN SYRINGE ULTRAFINE 31G X 15/64" 0.5 ML Misc Generic drug:  Insulin Syringe-Needle U-100 USE TWICE DAILY.   INSULIN SYRINGE .5CC/31GX5/16" 31G X 5/16" 0.5 ML Misc Use 2 per day to inject Hydrocortisone   cephALEXin 250 MG capsule Commonly known as:  KEFLEX Take 250 mg by mouth at bedtime.   Ferrous Sulfate Dried 200 (65 Fe) MG Tabs Take 65 mg by mouth daily.   FLEXERIL 10 MG tablet Generic drug:  cyclobenzaprine Take 10 mg by mouth 3 (three) times daily as needed for muscle spasms. Reported on 03/13/2015   fludrocortisone 0.1 MG tablet Commonly known as:  FLORINEF One tablet in the morning and two tablets at night.   hydrocortisone sodium succinate 100 MG Solr injection Commonly known as:  SOLU-CORTEF Inject  0.4 mls every morning and 0.71mls every evening.   potassium chloride 10 MEQ tablet Commonly known as:  K-DUR,KLOR-CON Take 2 in the morning and two at bedtime.   promethazine 12.5 MG tablet Commonly known as:  PHENERGAN Take 1 tablet (12.5 mg total) by mouth every 8 (eight) hours as needed for nausea or vomiting.   propranolol 20 MG tablet Commonly known as:  INDERAL   SYSTANE OP Apply 2 drops to eye daily as needed (for dry eyes).   traZODone 100 MG tablet Commonly known as:  DESYREL 100 mg. Takes 2 tablets at bed time   VITAMIN D3 SUPER STRENGTH 2000 units Caps Generic drug:  Cholecalciferol Take 2,000 Units by mouth daily.       Review of Systems   NAUSEA: This has been  slightly better, she takes ginger  ale when she feels sick  FATIGUE and difficulty sleeping: She continues to have  Insomnia,  taking trazodone  Also on Cymbalta   ANEMIA: Referred for iron infusion by her PCP, this was given on 03/21/16; hemoglobin was checked only 8 days after her infusion and is only slightly better    Genitourinary: She has had difficulty emptying her bladder and is being treated by urologist with various medications     Objective:   Physical Exam  BP (!) 98/56 (BP Location: Left Arm, Cuff Size: Normal)   Pulse 76   Ht 5' (1.524 m)   Wt 112 lb (50.8 kg)   SpO2 98%   BMI 21.87 kg/m  Blood pressure  Standing was 92/64 No pallor   No pedal edema  Assessment:      RENAL dysfunction: May have been related to dehydration She is doing better Now with resolution of renal dysfunction and not clear of the etiology Also was getting more Florinef prior to recent labs  Adrenal insufficiency:  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 and very low cortisol levels on the stimulation test Since she did not tolerate oral medications she is taking injectable hydrocortisone twice a day, using 0.4 mL in the morning and 0.2 mL in the evening   Currently she is feeling less nauseated but does have continued nonspecific weakness She'll continue physiological doses May inject in the abdomen if having bruising in the arms and legs   Orthostatic dysautonomic hypotension syndrome:   Her blood pressure appears to be going back down with reducing the Florinef down to 1 tablet again Plan: She will try to take one tablet twice a day with afternoon tablet being adjusted based on blood pressure reading Guidelines given  Weakness: Not clear of the etiology, she is not better with iron infusion Not clear if this may be medication related She needs to discuss with PCP and neurologist also   HYPERCALCEMIA: Resolved with improvement of renal dysfunction     Plan:      As above,  Patient instructions below      Patient Instructions  1 twice daily on Florinef, lreave off pm dose if sitting BP > 150 or standing BP > 130    .  Darcy Barbara  Note: This office note was prepared with Insurance underwriter. Any transcriptional errors that result from this process are unintentional.

## 2016-04-01 NOTE — Patient Instructions (Addendum)
1 twice daily on Florinef, lreave off pm dose if sitting BP > 150 or standing BP > 130

## 2016-04-04 ENCOUNTER — Telehealth: Payer: Self-pay | Admitting: Endocrinology

## 2016-04-04 NOTE — Telephone Encounter (Signed)
Patient ask you to give her a her back

## 2016-04-04 NOTE — Telephone Encounter (Signed)
Going to neurologist tomorrow and wanted dr Lucianne Muss to be aware

## 2016-04-05 ENCOUNTER — Encounter: Payer: Self-pay | Admitting: Neurology

## 2016-04-05 ENCOUNTER — Ambulatory Visit (INDEPENDENT_AMBULATORY_CARE_PROVIDER_SITE_OTHER): Payer: PPO | Admitting: Neurology

## 2016-04-05 VITALS — BP 167/81 | HR 99 | Ht 60.0 in | Wt 112.5 lb

## 2016-04-05 DIAGNOSIS — E2740 Unspecified adrenocortical insufficiency: Secondary | ICD-10-CM

## 2016-04-05 DIAGNOSIS — R269 Unspecified abnormalities of gait and mobility: Secondary | ICD-10-CM

## 2016-04-05 DIAGNOSIS — G35 Multiple sclerosis: Secondary | ICD-10-CM | POA: Diagnosis not present

## 2016-04-05 DIAGNOSIS — R5382 Chronic fatigue, unspecified: Secondary | ICD-10-CM

## 2016-04-05 MED ORDER — BACLOFEN 10 MG PO TABS: 10.0000 mg | ORAL_TABLET | Freq: Three times a day (TID) | ORAL | 11 refills | Status: DC

## 2016-04-05 NOTE — Progress Notes (Signed)
[facility]  PATIENT: Katrina Rivera DOB: 1945/05/26   REASON FOR VISIT: Follow-up for multiple sclerosis  fatigue daytime drowsiness HISTORY FROM: Patient    HISTORY OF PRESENT ILLNESS: HISTORY YYMrs. Thad Rivera is a very pleasant 71 year-old right-handed woman, with relapsing remitting multiple sclerosis, was treated with Betaseron 4 -5 years, but could not tolerate the side effect, now is not on any immunomodulation therapy. neuropathic pain of bilateral lower extremities.   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   She has baseline gait difficulty, she fell in May 2013, fractured both elbows and her left knee cap.  She intermittently has been using a cane or walker.  She could not tolerate Requip because of nausea. She has been on gabapentin 300 mg 3 bid. She gained significant weight when on Lyrica, and therefore was switched back to gabapentin. She has orthostatic hypotension, and is on midodrine 2.5 mg and fludrocortisone 0.1 mg 1 in AM and 2 at night, and she is also on 20 mg of hydrocortisone.  She complains of bilateral lower extremity burning stinging sensation, getting worse when she sits still, difficulty falling into sleep, she has the urge to move because of bilateral extremity discomfort, has tried Requip, could not tolerate it because of GI side effects, Neuprol patch does not work either, she also tried Elavil, Cymbalta in the past, could not tolerate it due to side effect.   Jan 01/2014:YY  She came in urgently for acute worsening of her generalized condition since her lumbar decompression surgery in January 24 2013 by [name], prior to surgery, she suffered left-sided low back pain, radiating pain to bilateral lower extremity, left is much worse than her right side, there was left L5-S1 extraforaminal herniated nucleus pulposus with left L5 radiculopathy she had left L5-S1 extraforaminal microdiscectomy utilizing microscopic dissection.  She had overnight stay at the hospital the day after surgery, she fell when trying to get up using bathroom, landed on her left side, surgery did help her lower back pain, radiating pain to her left leg, but she complained of worsening bilateral lower extremity deep achy pain, worsening gait difficulty, she has not used walker for 3 years, now she began to use her walker, she felt her skin is so tight at bilateral lower extremity, left leg swelling, also has difficulty swallowing, blurred vision, worsening fatigue, difficulty concentration, she has not had MRI evaluation for her multiple sclerosis for many years, is not on any immunomodulation therapy   UPDATE Mar 01, 2014:YY She came in with a list of complains, much worse compared to presurgical level. She complains of blurred vision, difficulty sleeping, difficulty concentrating, dizziness, worsening gait difficulty, The most bothersome symptoms are bilateral lower extremity pain, constant, knife cutting pain, left worse than right Left leg showed no DVT on doppler study. She had 15 LB weight gain over one month, more difficulty sleepy, body shaking, more difficulty sleepy.  She has tried fentanyl patch 50 mcg, Trileptal, Neurontin without helping her symptoms .She is going to be seen by her surgeon Dr. Yetta BarreJones in 3 days  We have  reviewed MRI of the brain cervical lumbar spine together, these were done at [facility] imaging in January 2015 MRI lumbar: L5-S1: disc bulging and facet hypertrophy, status  post microdiscectomy on the left, with expected post-surgical changes. Multi-level facet hypertrophy, with no spinal stenosis or foraminal narrowing.  MRI cervical: Possible small chronic demyelinating plaque at C6 on the right side. No abnormal enhancing lesions  MRI brain: Multiple supratentorial and 1 infratentorial chronic demyelinating plaques. No acute plaques. Mild cerebellar tonsillar ectopia there was no significant change in the brain lesions,   UPDATE 7/16/15YY Patient returns for follow up. Her biggest  complaint today is restless legs. She has never tried Neupro. She has had multiple trials of medications for her chronic pain. She is currently taking fentanyl, Trileptal, Cymbalta, lidocaine gel  UPDATE Feb 27 2014:YYShe is not on any long-term immunomodulation therapy for her relapsing remitting multiple sclerosis, neurological deficit has been fairly stable, she has baseline gait difficulty, the most bothersome symptoms for her is low back pain, bilateral lower extremity deep achy pain, radiating pain from left lower back to her left leg,  She was recently found to have very low cortisol level, under endocrinologist Dr. Remus Blake care, UPDATE June 15 2016YY: AS patient symptoms. Return to work with restrictions due to dizziness several days as well as we have a job description. She initially presented She had lumbar decompression by Dr. Marikay Alar in Jun 18 2014, which did help her low back pain, but now she experienced worsening left lower extremity spasm, left calf spasm so hard, as if a knot was tied, difficulty walking, she was giving a steroid package, no help, She complained excessive weight gain with Lyrica, Neurontin did not help, UPDATE July 27th 2016:YY She came in with a list of complaints, continue complains of unbalanced, staggering, generalized weakness, numbness tingling at bilateral lower extremity, worsening at her left leg, frequent urination, difficulty concentrating,  difficulty with multitasking UPDATE Oct 22 2014:YY She had a syncope episode in October 08 2014, was taken to the emergency room, have reviewed ED record, blood pressure was 200/60s laboratory showed normal CBC, CMP,  this happened in the setting of missing her hydrocortisone dose, because of nausea, lack of appetite, dehydration, she is taking hydrocortisone because of Addison's disease, now she is on hydrocortisone shots,She is back to her baseline now, mild gait difficulty, also complains of anxiety, constant bilateral lower chamber paresthesia, she wants to get off Cymbalta 30 mg daily, worry about the long-term side effect. She also complains of bilateral neck, shoulder or upper extremity itching,  UPDATE Apr 05 2016: She had a spinal stimulator placement by neurosurgeon Dr. Buck Mam in January third 2018, which has helped her low back pain,and leg pain.  She had sinus infection, was treated with Zpack and nausea,  She has iron infusion in Feb 2018,  She noticed that she has worsening gait abnormality,  She also has diffuse body achy pain. She has tried Flexeril without benefit, previously tried and failed gabapentin, Cymbalta, fentanyl patch,  CPK was normal 39 WBC showed hemoglobin of 10 point 8, which was mildly decreased normal free T4, TSH, CMP, creat 1.1,  normal B12,   REVIEW OF SYSTEMS: Full 14 system review of systems performed and notable only for those listed, all others are neg:   Fatigue, activity change, nausea, restless leg, insomnia, snoring, achy muscles, muscle cramps, walking difficulty, bruise easily, anemia, dizziness, headaches, numbness, weakness, tremor, decreased concentration, anxiety  ALLERGIES: No Known Allergies  HOME  MEDICATIONS: Outpatient Medications Prior to Visit  Medication Sig Dispense Refill  . alfuzosin (UROXATRAL) 10 MG 24 hr tablet Take 10 mg by mouth daily with breakfast.    . BD INSULIN SYRINGE ULTRAFINE 31G X 15/64" 0.5 ML MISC USE TWICE DAILY.  90 each 5  . cephALEXin (KEFLEX) 250 MG capsule Take 250 mg by mouth at bedtime.     . Cholecalciferol (VITAMIN D3 SUPER STRENGTH) 2000 units CAPS Take 2,000 Units by mouth daily.     . cyclobenzaprine (FLEXERIL) 10 MG tablet Take 10 mg by mouth 3 (three) times daily as needed for muscle spasms. Reported on 03/13/2015    . Ferrous Sulfate Dried 200 (65 FE) MG TABS Take 65 mg by mouth daily.    . fludrocortisone (FLORINEF) 0.1 MG tablet One tablet in the morning and two tablets at night. (Patient taking differently: Take 0.1 mg by mouth daily. One tablet in the morning and two tablets at night.) 270 tablet 3  . hydrocortisone sodium succinate (SOLU-CORTEF) 100 MG SOLR injection Inject  0.4 mls every morning and 0.23mls every evening. (Patient taking differently: Inject 100 mg into the muscle daily. Inject  0.4 mls every morning and 0.75mls every evening.) 3 each 3  . Insulin Syringe-Needle U-100 (INSULIN SYRINGE .5CC/31GX5/16") 31G X 5/16" 0.5 ML MISC Use 2 per day to inject Hydrocortisone 60 each 5  . Polyethyl Glycol-Propyl Glycol (SYSTANE OP) Apply 2 drops to eye daily as needed (for dry eyes).    . potassium chloride (K-DUR,KLOR-CON) 10 MEQ tablet Take 2 in the morning and two at bedtime. (Patient taking differently: Take 10 mEq by mouth at bedtime. Take 2 in the morning and two at bedtime.) 360 tablet 3  . promethazine (PHENERGAN) 12.5 MG tablet Take 1 tablet (12.5 mg total) by mouth every 8 (eight) hours as needed for nausea or vomiting. 30 tablet 2  . traZODone (DESYREL) 100 MG tablet 100 mg. Takes 2 tablets at bed time    . propranolol (INDERAL) 20 MG tablet      No facility-administered medications prior to visit.     PAST MEDICAL HISTORY: Past Medical History:  Diagnosis Date  . Addison's disease (HCC)    takes Solu Cortef daily  . Anemia    takes Ferrous Sulfate daily  . Anxiety    takes Xanax nightly  . Arthritis   . Chronic back pain    stenosis  . Depression    takes Cymbalta  daily  . Dizziness    if b/p drops   . Fibromyalgia   . Fibromyalgia   . GERD (gastroesophageal reflux disease)   . Headache(784.0)   . History of blood transfusion    no abnormal reaction noted  . History of bronchitis    many yrs ago   . Hypokalemia    takes Potassium daily  . Hypotension   . Hypotension    takes Florinef daily  . IBS (irritable bowel syndrome)    takes Librarian, academic daily  . Insomnia    takes Trazodone nightly  . Joint pain   . Multiple sclerosis (HCC)    doesn't take any meds  . Multiple sclerosis (HCC)   . Nocturia   . Osteoporosis   . Peripheral neuropathy (HCC)   . Restless leg syndrome   . Seasonal allergies    takes Zyrtec daily;uses Flonase daily as needed  . Syncope   . Urinary frequency    takes Flomax daily  . Weakness    numbness and  tingling    PAST SURGICAL HISTORY: Past Surgical History:  Procedure Laterality Date  . ABDOMINAL HYSTERECTOMY  1988  . APPENDECTOMY  1988  . CESAREAN SECTION  1973/1977   x2  . CHOLECYSTECTOMY  1997  . COLECTOMY  1990  . COLONOSCOPY    . ESOPHAGOGASTRODUODENOSCOPY    . EYE SURGERY     bilateral - /w IOL  . FRACTURE SURGERY Right    rods and screws  . LUMBAR LAMINECTOMY/DECOMPRESSION MICRODISCECTOMY Left 01/24/2013   Procedure: LUMBAR FIVE TO SACRAL ONE LUMBAR LAMINECTOMY/DECOMPRESSION MICRODISCECTOMY 1 LEVEL;  Surgeon: Tia Alert, MD;  Location: [facility];  Service: Neurosurgery;  Laterality: Left;  Marland Kitchen MAXIMUM ACCESS (MAS)POSTERIOR LUMBAR INTERBODY FUSION (PLIF) 1 LEVEL N/A 06/18/2014   Procedure: MAXIMUM ACCESS SURGERY POSTERIOR LUMBAR INTERBODY FUSION LUMBAR FIVE TO SACRAL ONE ;  Surgeon: Tia Alert, MD;  Location: [facility];  Service: Neurosurgery;  Laterality: N/A;    FAMILY HISTORY: Family History  Problem Relation Age of Onset  . Hypertension Mother   . Stroke Mother   . Heart attack Father     SOCIAL HISTORY: Social History   Social History  . Marital status: Married    Spouse  name: [name]  . Number of children: 2  . Years of education: 12   Occupational History  .  Disabled    Disabled   Social History Main Topics  . Smoking status: Never Smoker  . Smokeless tobacco: Never Used  . Alcohol use No  . Drug use: No  . Sexual activity: Not on file   Other Topics Concern  . Not on file   Social History Narrative   Pt lives at home with spouse. ([name])   Caffeine Use: 1 cups daily.   Right handed.   Disabled.   Education - high school   Patient has two adult children.     PHYSICAL EXAM  Vitals:   04/05/16 0920  BP: (!) 167/81  Pulse: 99  Weight: 112 lb 8 oz (51 kg)  Height: 5' (1.524 m)   Body mass index is 21.97 kg/m.  Generalized: Well developed, in no acute distress  Head: normocephalic and atraumatic,. Oropharynx benign  Neck: Supple, no carotid bruits  Cardiac: Regular rate rhythm, no murmur  Musculoskeletal: No deformity   Neurological examination   Mentation: Alert oriented to time, place, history taking. Attention span and concentration appropriate. Recent and remote memory intact.  Follows all commands speech and language fluent. ESS 4. FSS 51  Cranial nerve II-XII: Fundoscopic exam reveals sharp disc margins.Pupils were equal round reactive to light extraocular movements were full, visual field were full on confrontational test. Facial sensation and strength were normal. hearing was intact to finger rubbing bilaterally. Uvula tongue midline. head turning and shoulder shrug were normal and symmetric.Tongue protrusion into cheek strength was normal. Motor:  mild bilateral lower extremity spasticity, no significant upper or lower extremity weakness Sensory: normal and symmetric to light touch, pinprick, and  Vibration,  in the upper and lower extremities Coordination: finger-nose-finger, heel-to-shin bilaterally, no dysmetria Reflexes: Brachioradialis 2/2, biceps 2/2, triceps 2/2, patellar 2/2, Achilles 2/2, plantar responses were flexor  bilaterally. Gait and Station: Rising up from seated position without assistance, wide based  stance,  moderate stride, good arm swing, smooth turning, able to perform tiptoe, and heel walking without difficulty. Tandem gait is unsteady. No assistive device   DIAGNOSTIC DATA (LABS, IMAGING, TESTING) - I reviewed patient records, labs, notes, testing and imaging myself where  available.  Lab Results  Component Value Date   WBC 9.9 03/29/2016   HGB 10.6 (L) 03/29/2016   HCT 31.6 (L) 03/29/2016   MCV 88.9 03/29/2016   PLT 261.0 03/29/2016      Component Value Date/Time   NA 135 03/29/2016 1109   K 4.7 03/29/2016 1109   CL 102 03/29/2016 1109   CO2 26 03/29/2016 1109   GLUCOSE 132 (H) 03/29/2016 1109   BUN 21 03/29/2016 1109   CREATININE 1.11 03/29/2016 1109   CREATININE 0.85 11/06/2013 0840   CALCIUM 10.5 03/29/2016 1109   PROT 6.4 03/10/2016 1059   ALBUMIN 4.3 03/10/2016 1059   AST 18 03/10/2016 1059   ALT 16 03/10/2016 1059   ALKPHOS 39 03/10/2016 1059   BILITOT 0.3 03/10/2016 1059   GFRNONAA 32 (L) 03/01/2016 1504   GFRAA 37 (L) 03/01/2016 1504    Lab Results  Component Value Date   VITAMINB12 1,190 (H) 03/10/2016   Lab Results  Component Value Date   TSH 0.72 03/29/2016      ASSESSMENT AND PLAN  71 y.o. year old female    Relapsing remitting multiple sclerosis  Stable findings on MRI of the brain August 2015  Not on any immunomodulation therapy Chronic low back pain, history of lumbar decompression surgery  Spinal cord stimulator placement in January 2018 Bilateral lower extremity deep muscle achy pain  Stop Flexeril, tried baclofen 10 mg 3 times a day,   Levert Feinstein, M.D. Ph.D.  [facility] [address] [place], [place] [address] Phone: [phone] Fax:      [phone]

## 2016-04-06 ENCOUNTER — Telehealth: Payer: Self-pay | Admitting: Neurology

## 2016-04-06 NOTE — Telephone Encounter (Signed)
Spoke to patient - she is having achy pain in her legs that seems to be worse when wearing jeans or leggings.  She was provided baclofen on 04/05/16 but has not tried it yet.  She will start the medication and let us know if her symptoms do not improve.  Her office visit has been faxed to the requested physicians.

## 2016-04-06 NOTE — Telephone Encounter (Signed)
Pt called forgot to tell Dr Terrace Arabia if she wears jeans or leggings the pain in the legs are worse. RN pls call. Pls send OV to Dr Lucianne Muss, Dr Cleophas Dunker and Dr Liane Comber.

## 2016-04-11 MED ORDER — TIZANIDINE HCL 2 MG PO TABS: 2.0000 mg | ORAL_TABLET | Freq: Three times a day (TID) | ORAL | 11 refills | Status: DC | PRN

## 2016-04-11 NOTE — Telephone Encounter (Signed)
She has tried gabapentin, Lyrica, duloxetine, and baclofen.  Per Dr. Terrace Arabia, provide rx for tizanidine 2mg , TID. Patient agreeable to this change.  New rx sent to pharmacy.

## 2016-04-11 NOTE — Addendum Note (Signed)
Addended by: Lindell Spar C on: 04/11/2016 05:19 PM   Modules accepted: Orders

## 2016-04-11 NOTE — Telephone Encounter (Signed)
Pt says baclofen is not helping with muslce spasm and cramps, can't hardly walk. Said RLS is worse. Please call

## 2016-04-18 DIAGNOSIS — D509 Iron deficiency anemia, unspecified: Secondary | ICD-10-CM | POA: Diagnosis not present

## 2016-04-27 NOTE — Telephone Encounter (Signed)
Dr. Terrace Arabia has reviewed her chart - she has tried and failed multiple oral medications without improvement.  Instruct patient to contact her pain management physician - Dr. Murray Hodgkins at [facility].  Patient is agreeable to this plan and will contact him for an appt.

## 2016-04-27 NOTE — Telephone Encounter (Signed)
Pt called to inform that the tizanidine is not doing anything for her, she said she can be reached at home or celll#

## 2016-05-10 ENCOUNTER — Other Ambulatory Visit (INDEPENDENT_AMBULATORY_CARE_PROVIDER_SITE_OTHER): Payer: PPO

## 2016-05-10 DIAGNOSIS — G903 Multi-system degeneration of the autonomic nervous system: Secondary | ICD-10-CM | POA: Diagnosis not present

## 2016-05-10 DIAGNOSIS — I951 Orthostatic hypotension: Secondary | ICD-10-CM

## 2016-05-10 LAB — BASIC METABOLIC PANEL
BUN: 19 mg/dL (ref 6–23)
CO2: 27 meq/L (ref 19–32)
Calcium: 9.4 mg/dL (ref 8.4–10.5)
Chloride: 105 mEq/L (ref 96–112)
Creatinine, Ser: 1.09 mg/dL (ref 0.40–1.20)
GFR: 52.96 mL/min — ABNORMAL LOW (ref >60.00)
GLUCOSE: 88 mg/dL (ref 70–99)
POTASSIUM: 4.4 meq/L (ref 3.5–5.1)
SODIUM: 137 meq/L (ref 135–145)

## 2016-05-13 ENCOUNTER — Encounter: Payer: Self-pay | Admitting: Endocrinology

## 2016-05-13 ENCOUNTER — Ambulatory Visit (INDEPENDENT_AMBULATORY_CARE_PROVIDER_SITE_OTHER): Payer: PPO | Admitting: Endocrinology

## 2016-05-13 VITALS — BP 98/60 | HR 80 | Ht 60.0 in | Wt 111.0 lb

## 2016-05-13 DIAGNOSIS — E2749 Other adrenocortical insufficiency: Secondary | ICD-10-CM

## 2016-05-13 DIAGNOSIS — R531 Weakness: Secondary | ICD-10-CM

## 2016-05-13 DIAGNOSIS — D649 Anemia, unspecified: Secondary | ICD-10-CM | POA: Diagnosis not present

## 2016-05-13 MED ORDER — METHYLPREDNISOLONE 4 MG PO TABS: ORAL_TABLET | ORAL | 3 refills | Status: DC

## 2016-05-13 NOTE — Progress Notes (Signed)
Patient ID: Katrina Rivera, female   DOB: 22-Jul-1945, 71 y.o.   MRN: [id number]   Subjective:         Chief complaint: Follow-up For various issues   PROBLEM 1: Dysautonomic orthostatic hypotension   PAST history: She has had long-standing problems with orthostatic hypotension and also hyponatremia. Has been diagnosed with dysautonomia and has multiple other problems related to autonomic neuropathy.   She has been on Florinef since 2004 previously taking 3 tablets daily along with 5 tablets of potassium which had previously controlled her symptoms well . Because of persistent orthostatic symptoms she had been tried on midodrine on 05/28/12 but this was later stopped when blood pressure increased.  She  had medication adjustments done frequently over the last year for regulating her blood pressure.  RECENT history:   She has previously been on variable doses of Florinef, as much as 3 a day  She was told to increase her Florinef to twice a day on her last visit, At least on the days her blood pressure is low normal However she thinks this made her blood pressure go up and has a couple of readings of 163 and 167 systolic; she has gone back to 1 Florinef per day  Her blood pressure at home has been checked somewhat erratically and only about once a week even though she was told to monitor more often Also she is doing standing blood pressure readings only the last few times Sitting blood pressure: Recent range 116-152 systolic STANDING blood pressure range 88 systolic-122 systolic  She says she feels she still gets lightheaded when she is standing up but is able to do more physical activities than before    Hypokalemia: Controlled. She is  taking 2 tablets of potassium twice a day as directed    Lab Results  Component Value Date   CREATININE 1.09 05/10/2016   BUN 19 05/10/2016   NA 137 05/10/2016   K 4.4 05/10/2016   CL 105 05/10/2016   CO2 27 05/10/2016     HYPERCALCEMIA:  Calcium level had increased along with her renal function deteriorating Last calcium was Quite normal  Her PTH level was 31  Lab Results  Component Value Date   CALCIUM 9.4 05/10/2016   CALCIUM 10.5 03/29/2016   CALCIUM 11.2 (H) 03/10/2016   CALCIUM 10.2 03/01/2016   CALCIUM 10.9 (H) 03/01/2016   CALCIUM 10.9 (H) 01/20/2016   CALCIUM 8.9 10/08/2015   CALCIUM 9.3 07/08/2015   CALCIUM 9.7 04/10/2015     Adrenal Insufficiency:   This is secondary to pituitary dysfunction  Prior testing included Cortrosyn test showing stimulated level of 15.7, baseline 3.9.  Also confirmed by 24 hour urine free cortisol which was only 3.  Although previously she had tolerated oral hydrocortisone and small doses with improvement in her symptoms she did not continue this long-term She was again symptomatic with weight loss, decreased appetite, nausea and also diarrhea Cortrosyn stimulation test on 03/12/14 showed baseline cortisol level of 0.3 and post injection of 1.1 only  She had GI side effects from hydrocortisone , methylprednisolone and prednisone and was given hydrocortisone injections using insulin syringe  Currently is on  hydrocortisone injections    RECENT HISTORY: She has been on injectable hydrocortisone because of GI side effects from various oral formulations   She did initially feel better when her morning dose was increased to 20 mg hydrocortisone previously Currently taking 20 mg and after waking up and 10 mg around  5 PM, She is getting the injections from her husband  She is now complaining of a lot about the injections being painful  She has eaten a little better now and has leveled off on her weight but still gets somewhat tired  Wt Readings from Last 3 Encounters:  05/13/16 111 lb (50.3 kg)  04/05/16 112 lb 8 oz (51 kg)  04/01/16 112 lb (50.8 kg)    General Endocrinology:  ?  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 Always use your most recent med list.          alfuzosin 10 MG 24 hr tablet Commonly known as:  UROXATRAL Take 10 mg by mouth daily with breakfast.   BD INSULIN SYRINGE ULTRAFINE 31G X 15/64" 0.5 ML Misc Generic drug:  Insulin Syringe-Needle U-100 USE TWICE DAILY.   INSULIN SYRINGE .5CC/31GX5/16" 31G X 5/16" 0.5 ML Misc Use 2 per day to inject Hydrocortisone   cephALEXin 250 MG capsule Commonly known as:  KEFLEX Take 250 mg by mouth at bedtime.   Ferrous Sulfate Dried 200 (65 Fe) MG Tabs Take 65 mg by mouth daily.   fludrocortisone 0.1 MG tablet Commonly known as:  FLORINEF One tablet in the morning and two tablets at night.   hydrocortisone sodium succinate 100 MG Solr injection Commonly known as:  SOLU-CORTEF Inject  0.4 mls every morning and 0.51mls every evening.   methylPREDNISolone 4 MG tablet Commonly known as:  MEDROL 1/2 tab with each meal   potassium chloride 10 MEQ tablet Commonly known as:  K-DUR,KLOR-CON Take 2 in the morning and two at bedtime.   promethazine 12.5 MG tablet Commonly known as:  PHENERGAN Take 1 tablet (12.5 mg total) by mouth every 8 (eight) hours as needed for nausea or vomiting.   SYSTANE OP Apply 2 drops to eye daily as needed (for dry eyes).   tiZANidine 2 MG tablet Commonly known as:  ZANAFLEX Take 1 tablet (2 mg total) by mouth 3 (three) times daily as needed for muscle spasms.   traZODone  100 MG tablet Commonly known as:  DESYREL 100 mg. Takes 2 tablets at bed time   VITAMIN D3 SUPER STRENGTH 2000 units Caps Generic drug:  Cholecalciferol Take 2,000 Units by mouth daily.       Review of Systems   NAUSEA: This has been  slightly better, she takes ginger ale when she feels sick  FATIGUE and difficulty sleeping: She continues to have  Insomnia,  taking trazodone  Also on Cymbalta   ANEMIA: Referred for iron infusion by her PCP, this was given on 03/21/16; hemoglobin was checked only 8 days after her infusion and is only slightly better    Genitourinary: She has had difficulty emptying her bladder and is being treated by urologist with various  medications     Objective:   Physical Exam  BP 98/60 (BP Location: Left Arm, Cuff Size: Normal)   Pulse 80   Ht 5' (1.524 m)   Wt 111 lb (50.3 kg)   BMI 21.68 kg/m   Blood pressure  Standing was 92/64 No pallor   No pedal edema  Assessment:      RENAL dysfunction: Resolved completely  Adrenal insufficiency:  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 and very low cortisol levels on the stimulation test Since she did not tolerate oral medications she is taking injectable hydrocortisone twice a day, using 0.4 mL in the morning and 0.2 mL in the evening   Currently she is gradually improving with her energy level and nausea as well as her weight has leveled off She is compliant with her injections even though this is uncomfortable and more expensive She is however open to the idea of trying oral medication again because the pain of the injections She may have had less GI side effects with Medrol compared to prednisone and hydrocortisone For convenience and better tolerability she can try taking 2 mg with every meal instead of taking the larger dose in the morning when she is only drinking boost   Orthostatic dysautonomic hypotension syndrome:   Her blood pressure appears to be somewhat  variable and low normal but not clear if her orthostatic dizziness is always correlated with the actual level of the blood pressure She does not want to take Florinef twice a day as blood pressure may tend to go up sitting with this and then she feels anxious  ANEMIA: Her last hemoglobin was 10.2 and not improving with even iron infusions  Sinus tachycardia: This may be related to autonomic neuropathy  Plan:       Advised her to check her blood pressure daily including standing If her standing blood pressure is under 100 she will take an extra Florinef tablet This should help her to establish the frequency when she needs to take a second Florinef tablet  Medrol prescribed, she will take 2 mg with every meal and is not tolerated we will need to send another prescription for Solu-Cortef  Does not need to see a nephrologist  She may need to be seen by hematologist for persistent anemia, will defer to PCP     Patient Instructions  Take extra florinef when BP when standing BP <100  .  Loralie Malta  Note: This office note was prepared with Insurance underwriter. Any transcriptional errors that result from this process are unintentional.

## 2016-05-13 NOTE — Patient Instructions (Signed)
Take extra florinef when BP when standing BP <100

## 2016-05-23 DIAGNOSIS — D508 Other iron deficiency anemias: Secondary | ICD-10-CM | POA: Diagnosis not present

## 2016-06-24 DIAGNOSIS — S40879A Other superficial bite of unspecified upper arm, initial encounter: Secondary | ICD-10-CM | POA: Diagnosis not present

## 2016-06-24 DIAGNOSIS — F5101 Primary insomnia: Secondary | ICD-10-CM | POA: Diagnosis not present

## 2016-06-24 DIAGNOSIS — W57XXXA Bitten or stung by nonvenomous insect and other nonvenomous arthropods, initial encounter: Secondary | ICD-10-CM | POA: Diagnosis not present

## 2016-06-24 DIAGNOSIS — R002 Palpitations: Secondary | ICD-10-CM | POA: Diagnosis not present

## 2016-06-24 DIAGNOSIS — J0100 Acute maxillary sinusitis, unspecified: Secondary | ICD-10-CM | POA: Diagnosis not present

## 2016-07-11 DIAGNOSIS — Z01419 Encounter for gynecological examination (general) (routine) without abnormal findings: Secondary | ICD-10-CM | POA: Diagnosis not present

## 2016-07-11 DIAGNOSIS — Z1231 Encounter for screening mammogram for malignant neoplasm of breast: Secondary | ICD-10-CM | POA: Diagnosis not present

## 2016-07-11 DIAGNOSIS — N952 Postmenopausal atrophic vaginitis: Secondary | ICD-10-CM | POA: Diagnosis not present

## 2016-07-18 DIAGNOSIS — R630 Anorexia: Secondary | ICD-10-CM | POA: Diagnosis not present

## 2016-07-18 DIAGNOSIS — R110 Nausea: Secondary | ICD-10-CM | POA: Diagnosis not present

## 2016-07-18 DIAGNOSIS — J309 Allergic rhinitis, unspecified: Secondary | ICD-10-CM | POA: Diagnosis not present

## 2016-08-15 ENCOUNTER — Other Ambulatory Visit (INDEPENDENT_AMBULATORY_CARE_PROVIDER_SITE_OTHER): Payer: PPO

## 2016-08-15 DIAGNOSIS — R531 Weakness: Secondary | ICD-10-CM

## 2016-08-15 DIAGNOSIS — E2749 Other adrenocortical insufficiency: Secondary | ICD-10-CM

## 2016-08-15 DIAGNOSIS — D649 Anemia, unspecified: Secondary | ICD-10-CM | POA: Diagnosis not present

## 2016-08-15 LAB — CBC
HEMATOCRIT: 33.1 % — AB (ref 36.0–46.0)
HEMOGLOBIN: 11 g/dL — AB (ref 12.0–15.0)
MCHC: 33.1 g/dL (ref 30.0–36.0)
MCV: 90.5 fl (ref 78.0–100.0)
Platelets: 230 10*3/uL (ref 150.0–400.0)
RBC: 3.66 Mil/uL — ABNORMAL LOW (ref 3.87–5.11)
RDW: 14.5 % (ref 11.5–15.5)
WBC: 7.5 10*3/uL (ref 4.0–10.5)

## 2016-08-16 LAB — BASIC METABOLIC PANEL
BUN: 16 mg/dL (ref 6–23)
CALCIUM: 9.9 mg/dL (ref 8.4–10.5)
CO2: 25 mEq/L (ref 19–32)
CREATININE: 0.99 mg/dL (ref 0.40–1.20)
Chloride: 104 mEq/L (ref 96–112)
GFR: 59.13 mL/min — ABNORMAL LOW (ref >60.00)
Glucose, Bld: 85 mg/dL (ref 70–99)
Potassium: 4.7 mEq/L (ref 3.5–5.1)
Sodium: 138 mEq/L (ref 135–145)

## 2016-08-16 LAB — TSH: TSH: 2.4 u[IU]/mL (ref 0.35–4.50)

## 2016-08-16 LAB — T4, FREE: FREE T4: 0.89 ng/dL (ref 0.60–1.60)

## 2016-08-18 ENCOUNTER — Ambulatory Visit (INDEPENDENT_AMBULATORY_CARE_PROVIDER_SITE_OTHER): Payer: PPO | Admitting: Endocrinology

## 2016-08-18 ENCOUNTER — Encounter: Payer: Self-pay | Admitting: Endocrinology

## 2016-08-18 VITALS — BP 124/78 | HR 79 | Ht 60.0 in | Wt 107.4 lb

## 2016-08-18 DIAGNOSIS — I951 Orthostatic hypotension: Secondary | ICD-10-CM

## 2016-08-18 DIAGNOSIS — E2749 Other adrenocortical insufficiency: Secondary | ICD-10-CM | POA: Diagnosis not present

## 2016-08-18 DIAGNOSIS — G903 Multi-system degeneration of the autonomic nervous system: Secondary | ICD-10-CM

## 2016-08-18 DIAGNOSIS — R5383 Other fatigue: Secondary | ICD-10-CM | POA: Diagnosis not present

## 2016-08-18 MED ORDER — METHYLPREDNISOLONE 4 MG PO TABS: ORAL_TABLET | ORAL | 3 refills | Status: DC

## 2016-08-18 MED ORDER — FLUDROCORTISONE ACETATE 0.1 MG PO TABS: ORAL_TABLET | ORAL | 3 refills | Status: DC

## 2016-08-18 NOTE — Progress Notes (Signed)
Patient ID: Katrina Rivera, female   DOB: 1945/08/29, 71 y.o.   MRN: [id number]   Subjective:         Chief complaint: Follow-up For various issues   PROBLEM 1: Dysautonomic orthostatic hypotension   PAST history: She has had long-standing problems with orthostatic hypotension and also hyponatremia. Has been diagnosed with dysautonomia and has multiple other problems related to autonomic neuropathy.   She has been on Florinef since 2004 previously taking 3 tablets daily along with 5 tablets of potassium which had previously controlled her symptoms well . Because of persistent orthostatic symptoms she had been tried on midodrine on 05/28/12 but this was later stopped when blood pressure increased.  She  had medication adjustments done frequently over the last year for regulating her blood pressure.  RECENT history:   She has  been on variable doses of Florinef, as much as 3 a day  SheHas increased her Florinef to twice a day about a month ago She thinks she was getting more lightheaded and her blood pressure was as low as 92 systolic standing up Previously taking 1 tablet daily  Subjectively doing better with less lightheadedness, does not occur every day She has not checked her blood pressure in the last 2 weeks, previous readings range from 92 up to 120 systolic on standing up and 52-71 diastolic Her pulse fluctuates between 81-1 06    Hypokalemia: Controlled. She is  taking 2 tablets of potassium twice a day as directed  Potassium is 4.7 now  Lab Results  Component Value Date   CREATININE 0.99 08/15/2016   BUN 16 08/15/2016   NA 138 08/15/2016   K 4.7 08/15/2016   CL 104 08/15/2016   CO2 25 08/15/2016    HYPERCALCEMIA:  No recurrence, this is consistently normal now   Adrenal Insufficiency:   This is secondary to pituitary dysfunction  Prior testing included Cortrosyn test showing stimulated level of 15.7, baseline 3.9.  Also confirmed by 24 hour  urine free cortisol which was only 3.  Although previously she had tolerated oral hydrocortisone and small doses with improvement in her symptoms she did not continue this long-term She was again symptomatic with weight loss, decreased appetite, nausea and also diarrhea Cortrosyn stimulation test on 03/12/14 showed baseline cortisol level of 0.3 and post injection of 1.1 only  She had GI side effects from hydrocortisone and prednisone and was given hydrocortisone injections using insulin syringe   RECENT HISTORY:  Currently is on methylprednisolone, previously was on hydrocortisone injections    She was asking about switching to oral preparations, previously had not tolerated methylprednisolone with GI symptoms but she was willing to try again She is taking 2 mg with every meal and has had no GI side effects now She is probably eating more complete meals are drinking and sure compared to before also  She does not complain of nausea She had lost weight but is trying to get more nutritional supplements now it is also taking Periactin from her PCP over the last month when her weight was 106 pounds  Wt Readings from Last 3 Encounters:  08/18/16 107 lb 6.4 oz (48.7 kg)  05/13/16 111 lb (50.3 kg)  04/05/16 112 lb 8 oz (51 kg)    General Endocrinology:  ?  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 Always use your most  recent med list.          alfuzosin 10 MG 24 hr tablet Commonly known as:  UROXATRAL Take 10 mg by mouth daily with breakfast.   cephALEXin 250 MG capsule Commonly known as:  KEFLEX Take 250 mg by mouth at bedtime.   cyproheptadine 4 MG tablet Commonly known as:  PERIACTIN Take 4 mg by mouth 2 (two) times daily.   Ferrous Sulfate Dried 200 (65 Fe) MG Tabs Take 65 mg by mouth daily.   fludrocortisone 0.1 MG tablet Commonly known as:  FLORINEF One tablet in the morning and two tablets at night.   methylPREDNISolone 4 MG tablet Commonly known as:  MEDROL 1/2 tab with each meal   potassium chloride 10 MEQ tablet Commonly known as:  K-DUR,KLOR-CON Take 2 in the morning and two at bedtime.   promethazine 12.5 MG tablet Commonly known as:  PHENERGAN Take 1 tablet (12.5 mg total) by mouth every 8 (eight) hours as needed for nausea or vomiting.   SYSTANE OP Apply 2 drops to eye daily as needed (for dry eyes).   tiZANidine 2 MG tablet Commonly known as:  ZANAFLEX Take 1 tablet (2 mg total) by mouth 3 (three) times daily as needed for muscle spasms.   traZODone 100 MG tablet Commonly known as:  DESYREL 100 mg. Takes 2 tablets at bed time   VITAMIN D3 SUPER STRENGTH 2000 units Caps Generic drug:  Cholecalciferol Take 2,000 Units by mouth daily.       Review of Systems   NAUSEA: This has been  slightly better, she takes ginger ale when she feels sick  FATIGUE and difficulty sleeping: She continues to have  Insomnia,  taking trazodone  Also on Cymbalta   ANEMIA: Referred for iron infusion by her PCP, this was given on 03/21/16; hemoglobin was checked only 8 days after her infusion and is only slightly better    Genitourinary: She has had difficulty emptying her bladder and is being treated by urologist with various medications     Objective:   Physical Exam  BP 120/84 (BP Location: Left Arm, Cuff Size: Normal)   Pulse 79   Ht 5' (1.524 m)   Wt 107 lb 6.4 oz  (48.7 kg)   SpO2 98%   BMI 20.98 kg/m   Blood pressure Repeated standing = 124/78 Pulse 88  Assessment:       Adrenal insufficiency:  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 and very low cortisol levels on the stimulation test  Since she did not tolerate oral medications Including methylprednisolone but she started doing this on her last visit and is able to tolerate it now possibly because of eating more complete meals are drinking and sure consistently Previously had been taking injectable hydrocortisone twice a day, using 0.4 mL in the morning and 0.2 mL in the evening   Currently she is not having  any nausea, weakness or loss of appetite However she is using Periactin to improve her appetite and red bull drinks for energy which appears to be helping Weight is only 1 pound better than last month She is able to take her medication consistently   Orthostatic dysautonomic hypotension syndrome:   Her blood pressure appears to be improved now and she has increased her Florinef back to 2 tablets daily No orthostasis today She does need to check her blood pressure more regularly at home  Weight loss: This appears to be unrelated to endocrine causes  Sinus tachycardia: This may be related to autonomic neuropathy, has variable heart rate but not excessive despite increasing caffeine  Plan:       Advised her to check her blood pressure regularly again daily including standing No change in Florinef She will try to take the full tablet of Medrol at breakfast and half tablet at dinnertime  Follow-up in 4 months Continue follow-up with PCP for weight loss issues   Total visit time for evaluation and management of multiple problems, review of labs, outside records, counseling = 25 minutes  There are no Patient Instructions on file for this visit. Marland Kitchen  Donice Alperin  Note: This office note was prepared with Insurance underwriter. Any  transcriptional errors that result from this process are unintentional.

## 2016-08-18 NOTE — Patient Instructions (Addendum)
Take 3 potassium/day  Medrol 1 at Bfst and 1/2 at supper

## 2016-08-22 DIAGNOSIS — E2740 Unspecified adrenocortical insufficiency: Secondary | ICD-10-CM | POA: Diagnosis not present

## 2016-08-22 DIAGNOSIS — G4700 Insomnia, unspecified: Secondary | ICD-10-CM | POA: Diagnosis not present

## 2016-08-22 DIAGNOSIS — M797 Fibromyalgia: Secondary | ICD-10-CM | POA: Diagnosis not present

## 2016-08-22 DIAGNOSIS — N183 Chronic kidney disease, stage 3 (moderate): Secondary | ICD-10-CM | POA: Diagnosis not present

## 2016-08-22 DIAGNOSIS — R630 Anorexia: Secondary | ICD-10-CM | POA: Diagnosis not present

## 2016-08-22 DIAGNOSIS — Z1389 Encounter for screening for other disorder: Secondary | ICD-10-CM | POA: Diagnosis not present

## 2016-08-22 DIAGNOSIS — D508 Other iron deficiency anemias: Secondary | ICD-10-CM | POA: Diagnosis not present

## 2016-09-01 ENCOUNTER — Inpatient Hospital Stay (HOSPITAL_COMMUNITY): Payer: PPO

## 2016-09-01 ENCOUNTER — Encounter (HOSPITAL_COMMUNITY): Payer: Self-pay | Admitting: Family Medicine

## 2016-09-01 ENCOUNTER — Emergency Department (HOSPITAL_COMMUNITY): Payer: PPO

## 2016-09-01 ENCOUNTER — Inpatient Hospital Stay (HOSPITAL_COMMUNITY)
Admission: EM | Admit: 2016-09-01 | Discharge: 2016-09-07 | DRG: 389 | Disposition: A | Discharge status: Home / Self Care | Payer: PPO | Attending: Internal Medicine | Admitting: Internal Medicine

## 2016-09-01 DIAGNOSIS — G8929 Other chronic pain: Secondary | ICD-10-CM | POA: Diagnosis not present

## 2016-09-01 DIAGNOSIS — R1030 Lower abdominal pain, unspecified: Secondary | ICD-10-CM | POA: Diagnosis not present

## 2016-09-01 DIAGNOSIS — I10 Essential (primary) hypertension: Secondary | ICD-10-CM | POA: Diagnosis not present

## 2016-09-01 DIAGNOSIS — T380X5A Adverse effect of glucocorticoids and synthetic analogues, initial encounter: Secondary | ICD-10-CM | POA: Diagnosis not present

## 2016-09-01 DIAGNOSIS — D649 Anemia, unspecified: Secondary | ICD-10-CM | POA: Diagnosis not present

## 2016-09-01 DIAGNOSIS — K56600 Partial intestinal obstruction, unspecified as to cause: Principal | ICD-10-CM | POA: Diagnosis present

## 2016-09-01 DIAGNOSIS — Z90710 Acquired absence of both cervix and uterus: Secondary | ICD-10-CM | POA: Diagnosis not present

## 2016-09-01 DIAGNOSIS — M797 Fibromyalgia: Secondary | ICD-10-CM | POA: Diagnosis not present

## 2016-09-01 DIAGNOSIS — R1110 Vomiting, unspecified: Secondary | ICD-10-CM | POA: Diagnosis not present

## 2016-09-01 DIAGNOSIS — Z9049 Acquired absence of other specified parts of digestive tract: Secondary | ICD-10-CM

## 2016-09-01 DIAGNOSIS — D72829 Elevated white blood cell count, unspecified: Secondary | ICD-10-CM | POA: Diagnosis not present

## 2016-09-01 DIAGNOSIS — R030 Elevated blood-pressure reading, without diagnosis of hypertension: Secondary | ICD-10-CM | POA: Diagnosis not present

## 2016-09-01 DIAGNOSIS — E860 Dehydration: Secondary | ICD-10-CM

## 2016-09-01 DIAGNOSIS — M810 Age-related osteoporosis without current pathological fracture: Secondary | ICD-10-CM | POA: Diagnosis present

## 2016-09-01 DIAGNOSIS — E119 Type 2 diabetes mellitus without complications: Secondary | ICD-10-CM | POA: Diagnosis present

## 2016-09-01 DIAGNOSIS — G35 Multiple sclerosis: Secondary | ICD-10-CM | POA: Diagnosis not present

## 2016-09-01 DIAGNOSIS — G903 Multi-system degeneration of the autonomic nervous system: Secondary | ICD-10-CM

## 2016-09-01 DIAGNOSIS — K219 Gastro-esophageal reflux disease without esophagitis: Secondary | ICD-10-CM | POA: Diagnosis present

## 2016-09-01 DIAGNOSIS — I951 Orthostatic hypotension: Secondary | ICD-10-CM | POA: Diagnosis present

## 2016-09-01 DIAGNOSIS — Z79899 Other long term (current) drug therapy: Secondary | ICD-10-CM

## 2016-09-01 DIAGNOSIS — R109 Unspecified abdominal pain: Secondary | ICD-10-CM

## 2016-09-01 DIAGNOSIS — Z8249 Family history of ischemic heart disease and other diseases of the circulatory system: Secondary | ICD-10-CM

## 2016-09-01 DIAGNOSIS — Z823 Family history of stroke: Secondary | ICD-10-CM | POA: Diagnosis not present

## 2016-09-01 DIAGNOSIS — K5669 Other partial intestinal obstruction: Secondary | ICD-10-CM | POA: Diagnosis not present

## 2016-09-01 DIAGNOSIS — E876 Hypokalemia: Secondary | ICD-10-CM | POA: Diagnosis not present

## 2016-09-01 DIAGNOSIS — M545 Low back pain: Secondary | ICD-10-CM | POA: Diagnosis present

## 2016-09-01 DIAGNOSIS — R140 Abdominal distension (gaseous): Secondary | ICD-10-CM | POA: Diagnosis not present

## 2016-09-01 DIAGNOSIS — Z0189 Encounter for other specified special examinations: Secondary | ICD-10-CM

## 2016-09-01 DIAGNOSIS — K56609 Unspecified intestinal obstruction, unspecified as to partial versus complete obstruction: Secondary | ICD-10-CM

## 2016-09-01 DIAGNOSIS — R197 Diarrhea, unspecified: Secondary | ICD-10-CM

## 2016-09-01 DIAGNOSIS — E271 Primary adrenocortical insufficiency: Secondary | ICD-10-CM | POA: Diagnosis not present

## 2016-09-01 DIAGNOSIS — R110 Nausea: Secondary | ICD-10-CM | POA: Diagnosis not present

## 2016-09-01 DIAGNOSIS — R1084 Generalized abdominal pain: Secondary | ICD-10-CM | POA: Diagnosis not present

## 2016-09-01 DIAGNOSIS — Z4682 Encounter for fitting and adjustment of non-vascular catheter: Secondary | ICD-10-CM | POA: Diagnosis not present

## 2016-09-01 LAB — CBC
HEMATOCRIT: 41 % (ref 36.0–46.0)
Hemoglobin: 13.6 g/dL (ref 12.0–15.0)
MCH: 29.7 pg (ref 26.0–34.0)
MCHC: 33.2 g/dL (ref 30.0–36.0)
MCV: 89.5 fL (ref 78.0–100.0)
PLATELETS: 267 10*3/uL (ref 150–400)
RBC: 4.58 MIL/uL (ref 3.87–5.11)
RDW: 13.1 % (ref 11.5–15.5)
WBC: 11.3 10*3/uL — AB (ref 4.0–10.5)

## 2016-09-01 LAB — URINALYSIS, ROUTINE W REFLEX MICROSCOPIC
BACTERIA UA: NONE SEEN
BILIRUBIN URINE: NEGATIVE
Glucose, UA: NEGATIVE mg/dL
Hgb urine dipstick: NEGATIVE
KETONES UR: 5 mg/dL — AB
NITRITE: NEGATIVE
PH: 6 (ref 5.0–8.0)
Protein, ur: 30 mg/dL — AB
SPECIFIC GRAVITY, URINE: 1.02 (ref 1.005–1.030)

## 2016-09-01 LAB — COMPREHENSIVE METABOLIC PANEL
ALBUMIN: 4 g/dL (ref 3.5–5.0)
ALK PHOS: 41 U/L (ref 38–126)
ALT: 18 U/L (ref 14–54)
AST: 25 U/L (ref 15–41)
Anion gap: 8 (ref 5–15)
BILIRUBIN TOTAL: 0.5 mg/dL (ref 0.3–1.2)
BUN: 19 mg/dL (ref 6–20)
CALCIUM: 9.5 mg/dL (ref 8.9–10.3)
CO2: 24 mmol/L (ref 22–32)
CREATININE: 0.95 mg/dL (ref 0.44–1.00)
Chloride: 106 mmol/L (ref 101–111)
GFR calc Af Amer: >60 mL/min (ref >60)
GLUCOSE: 135 mg/dL — AB (ref 65–99)
POTASSIUM: 4.5 mmol/L (ref 3.5–5.1)
Sodium: 138 mmol/L (ref 135–145)
TOTAL PROTEIN: 6.9 g/dL (ref 6.5–8.1)

## 2016-09-01 LAB — LIPASE, BLOOD: Lipase: 36 U/L (ref 11–51)

## 2016-09-01 MED ORDER — ORAL CARE MOUTH RINSE
15.0000 mL | Freq: Two times a day (BID) | OROMUCOSAL | Status: DC
  Administered 2016-09-01 – 2016-09-07 (×10): 15 mL via OROMUCOSAL

## 2016-09-01 MED ORDER — MORPHINE SULFATE (PF) 2 MG/ML IV SOLN
4.0000 mg | Freq: Once | INTRAVENOUS | Status: AC
  Administered 2016-09-01: 4 mg via INTRAVENOUS
  Filled 2016-09-01: qty 2

## 2016-09-01 MED ORDER — SODIUM CHLORIDE 0.45 % IV SOLN
INTRAVENOUS | Status: DC
  Administered 2016-09-01: 11:00:00 via INTRAVENOUS

## 2016-09-01 MED ORDER — ONDANSETRON HCL 4 MG/2ML IJ SOLN
4.0000 mg | Freq: Once | INTRAMUSCULAR | Status: AC
  Administered 2016-09-01: 4 mg via INTRAVENOUS
  Filled 2016-09-01: qty 2

## 2016-09-01 MED ORDER — KETOROLAC TROMETHAMINE 15 MG/ML IJ SOLN
15.0000 mg | Freq: Four times a day (QID) | INTRAMUSCULAR | Status: AC | PRN
  Administered 2016-09-01 – 2016-09-05 (×5): 15 mg via INTRAVENOUS
  Filled 2016-09-01 (×5): qty 1

## 2016-09-01 MED ORDER — KETOROLAC TROMETHAMINE 30 MG/ML IJ SOLN: 30.0000 mg | Freq: Four times a day (QID) | INTRAMUSCULAR | Status: DC | PRN

## 2016-09-01 MED ORDER — ONDANSETRON HCL 4 MG/2ML IJ SOLN
4.0000 mg | Freq: Four times a day (QID) | INTRAMUSCULAR | Status: DC | PRN
  Administered 2016-09-01 – 2016-09-04 (×3): 4 mg via INTRAVENOUS
  Filled 2016-09-01 (×3): qty 2

## 2016-09-01 MED ORDER — IOPAMIDOL (ISOVUE-300) INJECTION 61%
100.0000 mL | Freq: Once | INTRAVENOUS | Status: AC | PRN
  Administered 2016-09-01: 100 mL via INTRAVENOUS

## 2016-09-01 MED ORDER — CHLORHEXIDINE GLUCONATE 0.12 % MT SOLN
15.0000 mL | Freq: Two times a day (BID) | OROMUCOSAL | Status: DC
  Administered 2016-09-01 – 2016-09-07 (×12): 15 mL via OROMUCOSAL
  Filled 2016-09-01 (×12): qty 15

## 2016-09-01 MED ORDER — SODIUM CHLORIDE 0.9 % IV BOLUS (SEPSIS)
1000.0000 mL | Freq: Once | INTRAVENOUS | Status: AC
  Administered 2016-09-01: 1000 mL via INTRAVENOUS

## 2016-09-01 MED ORDER — DICYCLOMINE HCL 10 MG PO CAPS
10.0000 mg | ORAL_CAPSULE | Freq: Once | ORAL | Status: AC
  Administered 2016-09-01: 10 mg via ORAL
  Filled 2016-09-01: qty 1

## 2016-09-01 MED ORDER — ONDANSETRON HCL 4 MG PO TABS: 4.0000 mg | ORAL_TABLET | Freq: Four times a day (QID) | ORAL | Status: DC | PRN

## 2016-09-01 MED ORDER — METHYLPREDNISOLONE SODIUM SUCC 40 MG IJ SOLR
40.0000 mg | Freq: Two times a day (BID) | INTRAMUSCULAR | Status: DC
  Administered 2016-09-01 – 2016-09-03 (×5): 40 mg via INTRAVENOUS
  Filled 2016-09-01 (×5): qty 1

## 2016-09-01 MED ORDER — TRAZODONE HCL 50 MG PO TABS
200.0000 mg | ORAL_TABLET | Freq: Every day | ORAL | Status: DC
  Administered 2016-09-01 – 2016-09-06 (×4): 200 mg via ORAL
  Filled 2016-09-01: qty 4
  Filled 2016-09-01 (×2): qty 2
  Filled 2016-09-01: qty 4
  Filled 2016-09-01: qty 2

## 2016-09-01 MED ORDER — DIATRIZOATE MEGLUMINE & SODIUM 66-10 % PO SOLN
90.0000 mL | Freq: Once | ORAL | Status: AC
  Administered 2016-09-01: 90 mL via NASOGASTRIC
  Filled 2016-09-01: qty 90

## 2016-09-01 MED ORDER — ENOXAPARIN SODIUM 40 MG/0.4ML ~~LOC~~ SOLN
40.0000 mg | SUBCUTANEOUS | Status: DC
  Administered 2016-09-01 – 2016-09-06 (×6): 40 mg via SUBCUTANEOUS
  Filled 2016-09-01 (×6): qty 0.4

## 2016-09-01 MED ORDER — IOPAMIDOL (ISOVUE-300) INJECTION 61%
INTRAVENOUS | Status: AC
  Administered 2016-09-01: 100 mL via INTRAVENOUS
  Filled 2016-09-01: qty 100

## 2016-09-01 NOTE — H&P (Signed)
History and Physical    Katrina Rivera ZOX:[id number] DOB: Jan 18, 1946 DOA: 09/01/2016  PCP: [name], MD  Patient coming from: home  Chief Complaint:  Abdominal pain, n/v  HPI: Katrina Rivera is a 71 y.o. female with medical history significant of Multiple sclerosis, s/p partial bowel resection in the 90s due to her MS, addisons disease, CBP, fibromyalgia comes in with 4 days of progressive worsening abdominal pain generalized, nausea and diarrhea.  It started off with some diarrhea then progressed to pain, nausea and feeling bloated.  No fevers.  No bleeding.  Pt has no prior h/o sbo.  Found to have sbo high grade on ct, general surgery team consulted and involved.  ngt about to be placed.  Pt referred for admission for high grade SBO.   Review of Systems: As per HPI otherwise 10 point review of systems negative.   Past Medical History:  Diagnosis Date  . Addison's disease (HCC)    takes Solu Cortef daily  . Anemia    takes Ferrous Sulfate daily  . Anxiety    takes Xanax nightly  . Arthritis   . Chronic back pain    stenosis  . Depression    takes Cymbalta daily  . Dizziness    if b/p drops   . Fibromyalgia   . Fibromyalgia   . GERD (gastroesophageal reflux disease)   . Headache(784.0)   . History of blood transfusion    no abnormal reaction noted  . History of bronchitis    many yrs ago   . Hypokalemia    takes Potassium daily  . Hypotension   . Hypotension    takes Florinef daily  . IBS (irritable bowel syndrome)    takes Librarian, academic daily  . Insomnia    takes Trazodone nightly  . Joint pain   . Multiple sclerosis (HCC)    doesn't take any meds  . Multiple sclerosis (HCC)   . Nocturia   . Osteoporosis   . Peripheral neuropathy   . Restless leg syndrome   . Seasonal allergies    takes Zyrtec daily;uses Flonase daily as needed  . Syncope   . Urinary frequency    takes Flomax daily  . Weakness    numbness and tingling    Past Surgical History:    Procedure Laterality Date  . ABDOMINAL HYSTERECTOMY  1988  . APPENDECTOMY  1988  . CESAREAN SECTION  1973/1977   x2  . CHOLECYSTECTOMY  1997  . COLECTOMY  1990  . COLONOSCOPY    . ESOPHAGOGASTRODUODENOSCOPY    . EYE SURGERY     bilateral - /w IOL  . FRACTURE SURGERY Right    rods and screws  . LUMBAR LAMINECTOMY/DECOMPRESSION MICRODISCECTOMY Left 01/24/2013   Procedure: LUMBAR FIVE TO SACRAL ONE LUMBAR LAMINECTOMY/DECOMPRESSION MICRODISCECTOMY 1 LEVEL;  Surgeon: Tia Alert, MD;  Location: [facility];  Service: Neurosurgery;  Laterality: Left;  Marland Kitchen MAXIMUM ACCESS (MAS)POSTERIOR LUMBAR INTERBODY FUSION (PLIF) 1 LEVEL N/A 06/18/2014   Procedure: MAXIMUM ACCESS SURGERY POSTERIOR LUMBAR INTERBODY FUSION LUMBAR FIVE TO SACRAL ONE ;  Surgeon: Tia Alert, MD;  Location: [facility];  Service: Neurosurgery;  Laterality: N/A;     reports that she has never smoked. She has never used smokeless tobacco. She reports that she does not drink alcohol or use drugs.  No Known Allergies  Family History  Problem Relation Age of Onset  . Hypertension Mother   . Stroke Mother   . Heart attack Father  Prior to Admission medications   Medication Sig Start Date End Date Taking? Authorizing Provider  alfuzosin (UROXATRAL) 10 MG 24 hr tablet Take 10 mg by mouth daily with breakfast.   Yes [provider]  cephALEXin (KEFLEX) 250 MG capsule Take 250 mg by mouth at bedtime.  10/18/15  Yes [provider]  Cholecalciferol (VITAMIN D3 SUPER STRENGTH) 2000 units CAPS Take 2,000 Units by mouth daily.    Yes [provider]  cyproheptadine (PERIACTIN) 4 MG tablet Take 4 mg by mouth 2 (two) times daily.   Yes [provider]  fludrocortisone (FLORINEF) 0.1 MG tablet two tablets at night. 08/18/16  Yes Reather Littler, MD  methylPREDNISolone (MEDROL) 4 MG tablet 1/2 tab with each meal 08/18/16  Yes Reather Littler, MD  Polyethyl Glycol-Propyl Glycol (SYSTANE OP) Apply 2 drops to  eye daily as needed (for dry eyes).   Yes [provider]  potassium chloride (K-DUR,KLOR-CON) 10 MEQ tablet Take 2 in the morning and two at bedtime. Patient taking differently: Take 10 mEq by mouth at bedtime. Take 1 in the morning and two at bedtime. 07/07/15  Yes Reather Littler, MD  promethazine (PHENERGAN) 12.5 MG tablet Take 1 tablet (12.5 mg total) by mouth every 8 (eight) hours as needed for nausea or vomiting. 10/15/14  Yes Reather Littler, MD  tiZANidine (ZANAFLEX) 2 MG tablet Take 1 tablet (2 mg total) by mouth 3 (three) times daily as needed for muscle spasms. 04/11/16  Yes Levert Feinstein, MD  traZODone (DESYREL) 100 MG tablet Take 200 mg by mouth at bedtime.  02/26/16  Yes [provider]  Ferrous Sulfate Dried 200 (65 FE) MG TABS Take 65 mg by mouth daily.    [provider]    Physical Exam: Vitals:   09/01/16 0600 09/01/16 0615 09/01/16 0630 09/01/16 0700  BP: (!) 159/78  (!) 160/88 (!) 160/73  Pulse:  88  81  Resp:      Temp:      TempSrc:      SpO2:  96%  97%  Weight:      Height:        Constitutional: NAD, calm, comfortable Vitals:   09/01/16 0600 09/01/16 0615 09/01/16 0630 09/01/16 0700  BP: (!) 159/78  (!) 160/88 (!) 160/73  Pulse:  88  81  Resp:      Temp:      TempSrc:      SpO2:  96%  97%  Weight:      Height:       Eyes: PERRL, lids and conjunctivae normal ENMT: Mucous membranes are moist. Posterior pharynx clear of any exudate or lesions.Normal dentition.  Neck: normal, supple, no masses, no thyromegaly Respiratory: clear to auscultation bilaterally, no wheezing, no crackles. Normal respiratory effort. No accessory muscle use.  Cardiovascular: Regular rate and rhythm, no murmurs / rubs / gallops. No extremity edema. 2+ pedal pulses. No carotid bruits.  Abdomen: diffue tenderness, no masses palpated. No hepatosplenomegaly. Bowel sounds positive. Distended, no r/g nonacute abdomen exam Musculoskeletal: no clubbing / cyanosis. No joint  deformity upper and lower extremities. Good ROM, no contractures. Normal muscle tone.  Skin: no rashes, lesions, ulcers. No induration Neurologic: CN 2-12 grossly intact. Sensation intact, DTR normal. Strength 5/5 in all 4.  Psychiatric: Normal judgment and insight. Alert and oriented x 3. Normal mood.    Labs on Admission: I have personally reviewed following labs and imaging studies  CBC:  Recent Labs Lab 09/01/16 0153  WBC 11.3*  HGB 13.6  HCT 41.0  MCV 89.5  PLT 267   Basic Metabolic Panel:  Recent Labs Lab 09/01/16 0153  NA 138  K 4.5  CL 106  CO2 24  GLUCOSE 135*  BUN 19  CREATININE 0.95  CALCIUM 9.5   GFR: Estimated Creatinine Clearance: 42.8 mL/min (by C-G formula based on SCr of 0.95 mg/dL). Liver Function Tests:  Recent Labs Lab 09/01/16 0153  AST 25  ALT 18  ALKPHOS 41  BILITOT 0.5  PROT 6.9  ALBUMIN 4.0    Recent Labs Lab 09/01/16 0153  LIPASE 36    Urine analysis:    Component Value Date/Time   COLORURINE YELLOW 09/01/2016 0605   APPEARANCEUR CLOUDY (A) 09/01/2016 0605   LABSPEC 1.020 09/01/2016 0605   PHURINE 6.0 09/01/2016 0605   GLUCOSEU NEGATIVE 09/01/2016 0605   GLUCOSEU NEGATIVE 01/21/2016 1628   HGBUR NEGATIVE 09/01/2016 0605   BILIRUBINUR NEGATIVE 09/01/2016 0605   BILIRUBINUR negative 01/21/2016 1212   KETONESUR 5 (A) 09/01/2016 0605   PROTEINUR 30 (A) 09/01/2016 0605   UROBILINOGEN 0.2 01/21/2016 1628   NITRITE NEGATIVE 09/01/2016 0605   LEUKOCYTESUR MODERATE (A) 09/01/2016 0605     Radiological Exams on Admission: Dg Abdomen 1 View  Result Date: 09/01/2016 CLINICAL DATA:  Nausea vomiting diarrhea since Saturday. EXAM: ABDOMEN - 1 VIEW COMPARISON:  None. FINDINGS: Mild gaseous distention of bowel, more likely nonobstructive. Extensive bowel suture in the pelvic midline. No biliary or urinary calculi are evident. No free air is evident. IMPRESSION: Mild gaseous distention of bowel, more likely nonobstructive.  Electronically Signed   By: Ellery Plunk M.D.   On: 09/01/2016 04:11   Ct Abdomen Pelvis W Contrast  Result Date: 09/01/2016 CLINICAL DATA:  Nausea. Vomiting. Diarrhea. Four days duration. Abnormal urinalysis. EXAM: CT ABDOMEN AND PELVIS WITH CONTRAST TECHNIQUE: Multidetector CT imaging of the abdomen and pelvis was performed using the standard protocol following bolus administration of intravenous contrast. CONTRAST:  100 cc Isovue-300 COMPARISON:  01/01/2014 FINDINGS: Lower chest: Mild atelectasis at the lung bases. Hepatobiliary: Previous cholecystectomy. Pronounced intra and extra hepatic ductal dilatation. No evidence of calcified ductal stone. No ampullary region mass is visible by CT. Pancreas: Dilated pancreatic ductal system as well. In the setting of a dilated biliary ductal system, possibilities include an occult ampullary mass or an incompetent sphincter. Spleen: Normal Adrenals/Urinary Tract: Adrenal glands are normal. Kidneys are normal except for a few tiny cysts. No evidence of mass, stone or hydronephrosis. Bladder appears normal. Stomach/Bowel: Markedly dilated fluid-filled stomach and small intestine consistent with small bowel obstruction. There are collapsed loops of distal small intestine. Patient appears to of head subtotal colectomy. Vascular/Lymphatic: Aortic atherosclerosis. No aneurysm. IVC is normal. No retroperitoneal adenopathy. Reproductive: Previous hysterectomy.  No pelvic mass. Other: Small amount of free intraperitoneal fluid.  No free air. Musculoskeletal: Previous lumbosacral discectomy and fusion L5-S1. IMPRESSION: High-grade small bowel obstruction in this patient has had previous subtotal colectomy. Markedly dilated fluid and air-filled stomach and proximal small bowel with collapsed distal small bowel. Small amount of free fluid, presumably secondary to the small bowel obstruction. No free air. Marked intra and extra hepatic biliary ductal dilatation in this patient  has had previous cholecystectomy. Dilated pancreatic ductal system. Differential diagnosis includes ampullary stricture, occult ampullary mass, and incompetent sphincter in the setting of high-grade small bowel obstruction. Aortic atherosclerosis. Electronically Signed   By: Paulina Fusi M.D.   On: 09/01/2016 08:12   Old chart reviewed Case discussed with dr long  Assessment/Plan 71 yo female with h/o colectomy, MS, addisons comes in found to have high grade SBO  Principal Problem:   SBO (small bowel obstruction) (HCC)- place ngt.  Ivf.  General surgery consult obtained.   Npo x ice/meds.  Prn zofran and toradol ordered, it this does not work switch to iv opiates.  Hopefully she will improve with conservative management.  Active Problems:   MS (multiple sclerosis) (HCC)- noted   Dysautonomia orthostatic hypotension syndrome (HCC)- florinef on hold for now, pt advised not to get up out of bed without assistance   Addison's disease (HCC)- on pred 4mg  orally daily for the last 6 months at least, will give solumedrol 40mg  iv q 12 hours, and change to oral once sbo starts to resolve.   Med rec is not correctly completed.  Will place another order for pharm to complete accurately  DVT prophylaxis:  Scds, lovenox Code Status:  full Family Communication:  none  Disposition Plan:  Per day team Consults called:  General surgery Admission status:  admission   DAVID,RACHAL A MD [facility]  If 7PM-7AM, please contact night-coverage [email or web address] Password [email or web address]  09/01/2016, 9:00 AM

## 2016-09-01 NOTE — ED Provider Notes (Signed)
[facility]-EMERGENCY DEPT Provider Note   CSN: [id number] Arrival date & time: 09/01/16  0011  By signing my name below, I, Katrina Rivera, attest that this documentation has been prepared under the direction and in the presence of Katrina Rivera, Mayer Masker, MD. Electronically Signed: Diona Rivera, ED Scribe. 09/01/16. 3:20 AM.  History   Chief Complaint Chief Complaint  Patient presents with  . Emesis  . Headache  . Diarrhea    HPI Katrina Rivera is a 71 y.o. female who presents to the Emergency Department complaining of 8/10, sharp, abdominal pain that started Saturday morning, 08/27/16. Associated sx include nausea, diarrhea, HA, and dry heaving. Pt has taken imodium with mild relief. She notes having an appointment with her PCP tomorrow, but couldn't wait. Both her husband and mother (who lives with them) have been sick. No recent travel. Pt denies fever, vomiting, and blood in stool.  The history is provided by the patient. No language interpreter was used.    Past Medical History:  Diagnosis Date  . Addison's disease (HCC)    takes Solu Cortef daily  . Anemia    takes Ferrous Sulfate daily  . Anxiety    takes Xanax nightly  . Arthritis   . Chronic back pain    stenosis  . Depression    takes Cymbalta daily  . Dizziness    if b/p drops   . Fibromyalgia   . Fibromyalgia   . GERD (gastroesophageal reflux disease)   . Headache(784.0)   . History of blood transfusion    no abnormal reaction noted  . History of bronchitis    many yrs ago   . Hypokalemia    takes Potassium daily  . Hypotension   . Hypotension    takes Florinef daily  . IBS (irritable bowel syndrome)    takes Librarian, academic daily  . Insomnia    takes Trazodone nightly  . Joint pain   . Multiple sclerosis (HCC)    doesn't take any meds  . Multiple sclerosis (HCC)   . Nocturia   . Osteoporosis   . Peripheral neuropathy   . Restless leg syndrome   . Seasonal allergies    takes Zyrtec daily;uses Flonase daily  as needed  . Syncope   . Urinary frequency    takes Flomax daily  . Weakness    numbness and tingling    Patient Active Problem List   Diagnosis Date Noted  . Somnolence, daytime 10/22/2015  . Fatigue 10/22/2015  . Chronic leg pain 06/29/2015  . S/P lumbar spinal fusion 06/18/2014  . Abnormality of gait 02/18/2013  . S/P lumbar microdiscectomy 01/24/2013  . Hypokalemia 12/24/2012  . Sweating abnormality 10/19/2012  . Dysautonomia orthostatic hypotension syndrome (HCC) 08/16/2012  . Hereditary and idiopathic peripheral neuropathy 08/16/2012  . Chronic adrenal insufficiency (HCC) 08/16/2012  . MS (multiple sclerosis) (HCC) 08/15/2012    Past Surgical History:  Procedure Laterality Date  . ABDOMINAL HYSTERECTOMY  1988  . APPENDECTOMY  1988  . CESAREAN SECTION  1973/1977   x2  . CHOLECYSTECTOMY  1997  . COLECTOMY  1990  . COLONOSCOPY    . ESOPHAGOGASTRODUODENOSCOPY    . EYE SURGERY     bilateral - /w IOL  . FRACTURE SURGERY Right    rods and screws  . LUMBAR LAMINECTOMY/DECOMPRESSION MICRODISCECTOMY Left 01/24/2013   Procedure: LUMBAR FIVE TO SACRAL ONE LUMBAR LAMINECTOMY/DECOMPRESSION MICRODISCECTOMY 1 LEVEL;  Surgeon: Tia Alert, MD;  Location: [facility];  Service: Neurosurgery;  Laterality: Left;  .  MAXIMUM ACCESS (MAS)POSTERIOR LUMBAR INTERBODY FUSION (PLIF) 1 LEVEL N/A 06/18/2014   Procedure: MAXIMUM ACCESS SURGERY POSTERIOR LUMBAR INTERBODY FUSION LUMBAR FIVE TO SACRAL ONE ;  Surgeon: Tia Alert, MD;  Location: [facility];  Service: Neurosurgery;  Laterality: N/A;    OB History    No data available       Home Medications    Prior to Admission medications   Medication Sig Start Date End Date Taking? Authorizing Provider  alfuzosin (UROXATRAL) 10 MG 24 hr tablet Take 10 mg by mouth daily with breakfast.   Yes [provider]  cephALEXin (KEFLEX) 250 MG capsule Take 250 mg by mouth at bedtime.  10/18/15  Yes [provider]    Cholecalciferol (VITAMIN D3 SUPER STRENGTH) 2000 units CAPS Take 2,000 Units by mouth daily.    Yes [provider]  cyproheptadine (PERIACTIN) 4 MG tablet Take 4 mg by mouth 2 (two) times daily.   Yes [provider]  fludrocortisone (FLORINEF) 0.1 MG tablet two tablets at night. 08/18/16  Yes Reather Littler, MD  methylPREDNISolone (MEDROL) 4 MG tablet 1/2 tab with each meal 08/18/16  Yes Reather Littler, MD  Polyethyl Glycol-Propyl Glycol (SYSTANE OP) Apply 2 drops to eye daily as needed (for dry eyes).   Yes [provider]  potassium chloride (K-DUR,KLOR-CON) 10 MEQ tablet Take 2 in the morning and two at bedtime. Patient taking differently: Take 10 mEq by mouth at bedtime. Take 1 in the morning and two at bedtime. 07/07/15  Yes Reather Littler, MD  promethazine (PHENERGAN) 12.5 MG tablet Take 1 tablet (12.5 mg total) by mouth every 8 (eight) hours as needed for nausea or vomiting. 10/15/14  Yes Reather Littler, MD  tiZANidine (ZANAFLEX) 2 MG tablet Take 1 tablet (2 mg total) by mouth 3 (three) times daily as needed for muscle spasms. 04/11/16  Yes Levert Feinstein, MD  traZODone (DESYREL) 100 MG tablet Take 200 mg by mouth at bedtime.  02/26/16  Yes [provider]  Ferrous Sulfate Dried 200 (65 FE) MG TABS Take 65 mg by mouth daily.    [provider]    Family History Family History  Problem Relation Age of Onset  . Hypertension Mother   . Stroke Mother   . Heart attack Father     Social History Social History  Substance Use Topics  . Smoking status: Never Smoker  . Smokeless tobacco: Never Used  . Alcohol use No     Allergies   Patient has no known allergies.   Review of Systems Review of Systems  Constitutional: Negative for fever.  Gastrointestinal: Positive for abdominal pain, diarrhea and nausea. Negative for blood in stool and vomiting.  Neurological: Positive for headaches.  All other systems reviewed and are negative.    Physical  Exam Updated Vital Signs BP (!) 166/83 (BP Location: Left Arm)   Pulse (!) 104   Temp 98.4 F (36.9 C) (Oral)   Resp 18   Ht 5\' 1"  (1.549 m)   Wt 48.1 kg (106 lb)   SpO2 92%   BMI 20.03 kg/m   Physical Exam  Constitutional: She is oriented to person, place, and time. She appears well-developed and well-nourished.  HENT:  Head: Normocephalic and atraumatic.  Mucous membranes dry  Cardiovascular: Regular rhythm and normal heart sounds.   Tachycardia  Pulmonary/Chest: Effort normal and breath sounds normal. No respiratory distress. She has no wheezes.  Abdominal: Soft. She exhibits distension. There is tenderness. There is no  rebound and no guarding.  Hyperactive bowel sounds, mild distention, diffuse tenderness to palpation without rebound or guarding, no signs of peritonitis  Neurological: She is alert and oriented to person, place, and time.  Skin: Skin is warm and dry.  Psychiatric: She has a normal mood and affect.  Nursing note and vitals reviewed.    ED Treatments / Results  DIAGNOSTIC STUDIES: Oxygen Saturation is 97% on RA, normal by my interpretation.   COORDINATION OF CARE: 3:20 AM-Discussed next steps with pt. Pt verbalized understanding and is agreeable with the plan.   Labs (all labs ordered are listed, but only abnormal results are displayed) Labs Reviewed  COMPREHENSIVE METABOLIC PANEL - Abnormal; Notable for the following:       Result Value   Glucose, Bld 135 (*)    All other components within normal limits  CBC - Abnormal; Notable for the following:    WBC 11.3 (*)    All other components within normal limits  URINALYSIS, ROUTINE W REFLEX MICROSCOPIC - Abnormal; Notable for the following:    APPearance CLOUDY (*)    Ketones, ur 5 (*)    Protein, ur 30 (*)    Leukocytes, UA MODERATE (*)    Squamous Epithelial / LPF 6-30 (*)    Non Squamous Epithelial 0-5 (*)    All other components within normal limits  LIPASE, BLOOD    EKG  EKG  Interpretation None       Radiology Dg Abdomen 1 View  Result Date: 09/01/2016 CLINICAL DATA:  Nausea vomiting diarrhea since Saturday. EXAM: ABDOMEN - 1 VIEW COMPARISON:  None. FINDINGS: Mild gaseous distention of bowel, more likely nonobstructive. Extensive bowel suture in the pelvic midline. No biliary or urinary calculi are evident. No free air is evident. IMPRESSION: Mild gaseous distention of bowel, more likely nonobstructive. Electronically Signed   By: Ellery Plunk M.D.   On: 09/01/2016 04:11    Procedures Procedures (including critical care time)  Medications Ordered in ED Medications  sodium chloride 0.9 % bolus 1,000 mL (not administered)  sodium chloride 0.9 % bolus 1,000 mL (0 mLs Intravenous Stopped 09/01/16 0453)  ondansetron (ZOFRAN) injection 4 mg (4 mg Intravenous Given 09/01/16 0333)  dicyclomine (BENTYL) capsule 10 mg (10 mg Oral Given 09/01/16 0333)  morphine 2 MG/ML injection 4 mg (4 mg Intravenous Given 09/01/16 0334)     Initial Impression / Assessment and Plan / ED Course  I have reviewed the triage vital signs and the nursing notes.  Pertinent labs & imaging results that were available during my care of the patient were reviewed by me and considered in my medical decision making (see chart for details).  Clinical Course as of Sep 02 631  Thu Sep 01, 2016  3734 Patient reports improvement of symptoms with fluids and nausea medication. Abdominal films with mild gaseous distention but no overt obstruction. Patient is at risk for obstruction given her prior abdominal surgeries. I discussed with her options including a PO challenge to see how she does versus CT scan to further evaluate. Patient states she is comfortable with PO challenge.  [CH]  [id number] On recheck, patient is unable to provide significant amount urine. She was given an additional liter of fluids. Suspect dehydration. She has been able to tolerate sips of fluid but reports persistent abdominal  discomfort and nausea. For this reason, CT scan obtained.  [CH]    Clinical Course User Index [CH] Yvette Loveless, Mayer Masker, MD    Patient presents with abdominal  pain, vomiting, and diarrhea. She is nontoxic on exam. Initial tachycardic. No signs of peritonitis. She does have a history of bowel resection. This would put her at risk for obstruction. However, she has not had overt vomiting and reports passage of loose stools. Lab work is largely reassuring.  Abdominal films show mild gaseous distention which favors a nonobstructive pattern. On recheck initially, patient states that she felt much better. Trial of fluids attempted. Patient was unable to provide a urine sample. Likely secondary dehydration. We'll provide with an additional liter of fluids. Given persistent nausea with by mouth intake, will obtain CT scan to rule out obstruction.   Final Clinical Impressions(s) / ED Diagnoses   Final diagnoses:  Abdominal pain, vomiting, and diarrhea  Dehydration    New Prescriptions New Prescriptions   No medications on file   I personally performed the services described in this documentation, which was scribed in my presence. The recorded information has been reviewed and is accurate.     Shon Baton, MD 09/01/16 [phone]

## 2016-09-01 NOTE — ED Notes (Addendum)
Pt was given sprite and tolerated well.

## 2016-09-01 NOTE — Progress Notes (Signed)
Patient tolerating NGT well connected to low intermittent wall suction, drained greenish liquid at first and now drainage is dark brown.Abdomen remained distended.

## 2016-09-01 NOTE — ED Triage Notes (Signed)
Patient is experiencing nausea, vomiting, diarrhea, and headache since Saturday. Patient has took IMODIUM for diarrhea with some relief. Patient has an appointment with PCP tomorrow for symptoms but reports she could not wait.

## 2016-09-01 NOTE — ED Notes (Signed)
Pt made aware urine specimen is needed.  

## 2016-09-01 NOTE — ED Provider Notes (Signed)
Blood pressure (!) 160/73, pulse 81, temperature 98.4 F (36.9 C), temperature source Oral, resp. rate 18, height 5\' 1"  (1.549 m), weight 48.1 kg (106 lb), SpO2 97 %.  Assuming care from Dr. Wilkie Aye.  In short, Katrina Rivera is a 71 y.o. female with a chief complaint of Emesis; Headache; and Diarrhea .  Refer to the original H&P for additional details.  The current plan of care is to follow CT and reassess.   08:15 AM Patient with high-grade obstruction on CT. NG tube order placed. Will discuss with surgery.   08:31 AM Spoke with general surgery. They will see in consult. Doubt surgery will be needed. Asking medicine to admit. Will page.   08:46 AM Discussed patient's case with Hospitalist, Dr. Onalee Hua. Patient and family (if present) updated with plan. Care transferred to Hospitalist service.  I reviewed all nursing notes, vitals, pertinent old records, EKGs, labs, imaging (as available).  Alona Bene, MD   Maia Plan, MD 09/01/16 [phone]

## 2016-09-01 NOTE — Consult Note (Signed)
[facility] Consult Note  Katrina Rivera 16-Mar-1945  [id number].    Requesting MD: Long Chief Complaint/Reason for Consult: SBO  HPI:  Katrina Rivera is a 71yo female PMH Multiple sclerosis and Addison's disease, who presented to [facility] earlier this morning with 4 days of worsening abdominal pain, nausea, dry heaving, and anorexia. Patient states that over the weekend she had multiple episodes of diarrhea.  She tried imodium with no benefit. States that Sunday she began having abdominal pain and it has progressively gotten worse. Pain is mostly in the left side of her abdomen. Currently constant and severe. This has been associated with nausea, dry heaving, abdominal distension, and anorexia. Denies fever, chills, dysuria. Last BM was 2 days ago (which is normal for her). She is passing a small amount of flatus. Katrina Rivera also admits that she has had a 5lb weight loss since March as well as decreased appetite for 1 month.  Hospital workup: - CT scan shows high-grade SBO, markedly dilated fluid and air-filled stomach and proximal small bowel with collapsed distal small bowel; no free air; marked intra and extra hepatic biliary ductal dilatation with dilated pancreatic ductal system (?ampullary stricture, occult ampullary mass, incompetent sphincter?) - WBC 11.3; lipase and LFTs WNL  - PMH significant for Multiple sclerosis, Addison's disease, GERD, Chronic low back pain - Abdominal surgical history: abdominal surgical history: subtotal colectomy 1990 by Dr. Dorathy Kinsman; cholecystectomy 1997; c sections x2 in 1973 and 1977; hysterectomy/appendectomy 1988 - Anticoagulants: none - Nonsmoker  ROS: Review of Systems  Constitutional: Positive for weight loss.  HENT: Negative.   Eyes: Negative.   Respiratory: Negative.   Cardiovascular: Negative.   Gastrointestinal: Positive for abdominal pain, constipation, diarrhea, nausea and vomiting.  Genitourinary: Negative.    Musculoskeletal: Positive for back pain.  Skin: Negative.   Neurological: Negative.   All systems reviewed and otherwise negative except for as above  Family History  Problem Relation Age of Onset  . Hypertension Mother   . Stroke Mother   . Heart attack Father     Past Medical History:  Diagnosis Date  . Addison's disease ([place])    takes Solu Cortef daily  . Anemia    takes Ferrous Sulfate daily  . Anxiety    takes Xanax nightly  . Arthritis   . Chronic back pain    stenosis  . Depression    takes Cymbalta daily  . Dizziness    if b/p drops   . Fibromyalgia   . Fibromyalgia   . GERD (gastroesophageal reflux disease)   . Headache(784.0)   . History of blood transfusion    no abnormal reaction noted  . History of bronchitis    many yrs ago   . Hypokalemia    takes Potassium daily  . Hypotension   . Hypotension    takes Florinef daily  . IBS (irritable bowel syndrome)    takes Electronics engineer daily  . Insomnia    takes Trazodone nightly  . Joint pain   . Multiple sclerosis ([place])    doesn't take any meds  . Multiple sclerosis ([place])   . Nocturia   . Osteoporosis   . Peripheral neuropathy   . Restless leg syndrome   . Seasonal allergies    takes Zyrtec daily;uses Flonase daily as needed  . Syncope   . Urinary frequency    takes Flomax daily  . Weakness    numbness and tingling    Past Surgical History:  Procedure Laterality Date  .  ABDOMINAL HYSTERECTOMY  1988  . APPENDECTOMY  1988  . CESAREAN SECTION  1973/1977   x2  . CHOLECYSTECTOMY  1997  . COLECTOMY  1990  . COLONOSCOPY    . ESOPHAGOGASTRODUODENOSCOPY    . EYE SURGERY     bilateral - /w IOL  . FRACTURE SURGERY Right    rods and screws  . LUMBAR LAMINECTOMY/DECOMPRESSION MICRODISCECTOMY Left 01/24/2013   Procedure: LUMBAR FIVE TO SACRAL ONE LUMBAR LAMINECTOMY/DECOMPRESSION MICRODISCECTOMY 1 LEVEL;  Surgeon: Eustace Moore, MD;  Location: [place] [facility];  Service: Neurosurgery;  Laterality: Left;  Marland Kitchen  MAXIMUM ACCESS (MAS)POSTERIOR LUMBAR INTERBODY FUSION (PLIF) 1 LEVEL N/A 06/18/2014   Procedure: MAXIMUM ACCESS SURGERY POSTERIOR LUMBAR INTERBODY FUSION LUMBAR FIVE TO SACRAL ONE ;  Surgeon: Eustace Moore, MD;  Location: [place] [facility];  Service: Neurosurgery;  Laterality: N/A;    Social History:  reports that she has never smoked. She has never used smokeless tobacco. She reports that she does not drink alcohol or use drugs.  Allergies: No Known Allergies   (Not in a hospital admission)  Prior to Admission medications   Medication Sig Start Date End Date Taking? Authorizing Provider  alfuzosin (UROXATRAL) 10 MG 24 hr tablet Take 10 mg by mouth daily with breakfast.   Yes [provider]  cephALEXin (KEFLEX) 250 MG capsule Take 250 mg by mouth at bedtime.  10/18/15  Yes [provider]  Cholecalciferol (VITAMIN D3 SUPER STRENGTH) 2000 units CAPS Take 2,000 Units by mouth daily.    Yes [provider]  cyproheptadine (PERIACTIN) 4 MG tablet Take 4 mg by mouth 2 (two) times daily.   Yes [provider]  fludrocortisone (FLORINEF) 0.1 MG tablet two tablets at night. 08/18/16  Yes Elayne Snare, MD  methylPREDNISolone (MEDROL) 4 MG tablet 1/2 tab with each meal 08/18/16  Yes Elayne Snare, MD  Polyethyl Glycol-Propyl Glycol (SYSTANE OP) Apply 2 drops to eye daily as needed (for dry eyes).   Yes [provider]  potassium chloride (K-DUR,KLOR-CON) 10 MEQ tablet Take 2 in the morning and two at bedtime. Patient taking differently: Take 10 mEq by mouth at bedtime. Take 1 in the morning and two at bedtime. 07/07/15  Yes Elayne Snare, MD  promethazine (PHENERGAN) 12.5 MG tablet Take 1 tablet (12.5 mg total) by mouth every 8 (eight) hours as needed for nausea or vomiting. 10/15/14  Yes Elayne Snare, MD  tiZANidine (ZANAFLEX) 2 MG tablet Take 1 tablet (2 mg total) by mouth 3 (three) times daily as needed for muscle spasms. 04/11/16  Yes Marcial Pacas, MD  traZODone (DESYREL)  100 MG tablet Take 200 mg by mouth at bedtime.  02/26/16  Yes [provider]  Ferrous Sulfate Dried 200 (65 FE) MG TABS Take 65 mg by mouth daily.    [provider]    Blood pressure (!) 160/73, pulse 81, temperature 98.4 F (36.9 C), temperature source Oral, resp. rate 18, height '5\' 1"'  (1.549 m), weight 106 lb (48.1 kg), SpO2 97 %. Physical Exam: General: pleasant, WD/WN white female who is laying in bed in NAD HEENT: head is normocephalic, atraumatic.  Sclera are noninjected.  Pupils equal and round.  Ears and nose without any masses or lesions.  Mouth is pink and moist. Dentition fair Heart: regular, rate, and rhythm.  No obvious murmurs, gallops, or rubs noted.  Palpable pedal pulses bilaterally Lungs: CTAB, no wheezes, rhonchi, or rales noted.  Respiratory effort nonlabored Abd: well healed midline/lower transverse/mulitple lap incisions,  soft, distended, +BS, no masses, hernias, or organomegaly. +TTP left side of abdomen MS: all 4 extremities are symmetrical with no cyanosis, clubbing, or edema. Skin: warm and dry with no masses, lesions, or rashes Psych: A&Ox3 with an appropriate affect. Neuro: cranial nerves grossly intact, extremity CSM intact bilaterally, normal speech  Results for orders placed or performed during the hospital encounter of 09/01/16 (from the past 48 hour(s))  Lipase, blood     Status: None   Collection Time: 09/01/16  1:53 AM  Result Value Ref Range   Lipase 36 11 - 51 U/L  Comprehensive metabolic panel     Status: Abnormal   Collection Time: 09/01/16  1:53 AM  Result Value Ref Range   Sodium 138 135 - 145 mmol/L   Potassium 4.5 3.5 - 5.1 mmol/L   Chloride 106 101 - 111 mmol/L   CO2 24 22 - 32 mmol/L   Glucose, Bld 135 (H) 65 - 99 mg/dL   BUN 19 6 - 20 mg/dL   Creatinine, Ser 0.95 0.44 - 1.00 mg/dL   Calcium 9.5 8.9 - 10.3 mg/dL   Total Protein 6.9 6.5 - 8.1 g/dL   Albumin 4.0 3.5 - 5.0 g/dL   AST 25 15 - 41 U/L   ALT 18 14 - 54 U/L    Alkaline Phosphatase 41 38 - 126 U/L   Total Bilirubin 0.5 0.3 - 1.2 mg/dL   GFR calc non Af Amer >60 >60 mL/min   GFR calc Af Amer >60 >60 mL/min    Comment: (NOTE) The eGFR has been calculated using the CKD EPI equation. This calculation has not been validated in all clinical situations. eGFR's persistently <60 mL/min signify possible Chronic Kidney Disease.    Anion gap 8 5 - 15  CBC     Status: Abnormal   Collection Time: 09/01/16  1:53 AM  Result Value Ref Range   WBC 11.3 (H) 4.0 - 10.5 K/uL   RBC 4.58 3.87 - 5.11 MIL/uL   Hemoglobin 13.6 12.0 - 15.0 g/dL   HCT 41.0 36.0 - 46.0 %   MCV 89.5 78.0 - 100.0 fL   MCH 29.7 26.0 - 34.0 pg   MCHC 33.2 30.0 - 36.0 g/dL   RDW 13.1 11.5 - 15.5 %   Platelets 267 150 - 400 K/uL  Urinalysis, Routine w reflex microscopic     Status: Abnormal   Collection Time: 09/01/16  6:05 AM  Result Value Ref Range   Color, Urine YELLOW YELLOW   APPearance CLOUDY (A) CLEAR   Specific Gravity, Urine 1.020 1.005 - 1.030   pH 6.0 5.0 - 8.0   Glucose, UA NEGATIVE NEGATIVE mg/dL   Hgb urine dipstick NEGATIVE NEGATIVE   Bilirubin Urine NEGATIVE NEGATIVE   Ketones, ur 5 (A) NEGATIVE mg/dL   Protein, ur 30 (A) NEGATIVE mg/dL   Nitrite NEGATIVE NEGATIVE   Leukocytes, UA MODERATE (A) NEGATIVE   RBC / HPF 6-30 0 - 5 RBC/hpf   WBC, UA TOO NUMEROUS TO COUNT 0 - 5 WBC/hpf   Bacteria, UA NONE SEEN NONE SEEN   Squamous Epithelial / LPF 6-30 (A) NONE SEEN   Mucous PRESENT    Hyaline Casts, UA PRESENT    Amorphous Crystal PRESENT    Non Squamous Epithelial 0-5 (A) NONE SEEN   Dg Abdomen 1 View  Result Date: 09/01/2016 CLINICAL DATA:  Nausea vomiting diarrhea since Saturday. EXAM: ABDOMEN - 1 VIEW COMPARISON:  None. FINDINGS: Mild gaseous distention of bowel, more likely  nonobstructive. Extensive bowel suture in the pelvic midline. No biliary or urinary calculi are evident. No free air is evident. IMPRESSION: Mild gaseous distention of bowel, more likely  nonobstructive. Electronically Signed   By: Andreas Newport M.D.   On: 09/01/2016 04:11   Ct Abdomen Pelvis W Contrast  Result Date: 09/01/2016 CLINICAL DATA:  Nausea. Vomiting. Diarrhea. Four days duration. Abnormal urinalysis. EXAM: CT ABDOMEN AND PELVIS WITH CONTRAST TECHNIQUE: Multidetector CT imaging of the abdomen and pelvis was performed using the standard protocol following bolus administration of intravenous contrast. CONTRAST:  100 cc Isovue-300 COMPARISON:  01/01/2014 FINDINGS: Lower chest: Mild atelectasis at the lung bases. Hepatobiliary: Previous cholecystectomy. Pronounced intra and extra hepatic ductal dilatation. No evidence of calcified ductal stone. No ampullary region mass is visible by CT. Pancreas: Dilated pancreatic ductal system as well. In the setting of a dilated biliary ductal system, possibilities include an occult ampullary mass or an incompetent sphincter. Spleen: Normal Adrenals/Urinary Tract: Adrenal glands are normal. Kidneys are normal except for a few tiny cysts. No evidence of mass, stone or hydronephrosis. Bladder appears normal. Stomach/Bowel: Markedly dilated fluid-filled stomach and small intestine consistent with small bowel obstruction. There are collapsed loops of distal small intestine. Patient appears to of head subtotal colectomy. Vascular/Lymphatic: Aortic atherosclerosis. No aneurysm. IVC is normal. No retroperitoneal adenopathy. Reproductive: Previous hysterectomy.  No pelvic mass. Other: Small amount of free intraperitoneal fluid.  No free air. Musculoskeletal: Previous lumbosacral discectomy and fusion L5-S1. IMPRESSION: High-grade small bowel obstruction in this patient has had previous subtotal colectomy. Markedly dilated fluid and air-filled stomach and proximal small bowel with collapsed distal small bowel. Small amount of free fluid, presumably secondary to the small bowel obstruction. No free air. Marked intra and extra hepatic biliary ductal dilatation  in this patient has had previous cholecystectomy. Dilated pancreatic ductal system. Differential diagnosis includes ampullary stricture, occult ampullary mass, and incompetent sphincter in the setting of high-grade small bowel obstruction. Aortic atherosclerosis. Electronically Signed   By: Nelson Chimes M.D.   On: 09/01/2016 08:12   Anti-infectives    None        Assessment/Plan Multiple sclerosis Addison's disease GERD Chronic low back pain  SBO - abdominal surgical history: subtotal colectomy 1990 by Dr. Dorathy Kinsman; cholecystectomy 1997; c sections x2 in 1973 and 1977; hysterectomy/appendectomy 1988 - 4 days of worsening abdominal pain, nausea, dry heaving, abdominal distension - CT scan shows high-grade SBO, markedly dilated fluid and air-filled stomach and proximal small bowel with collapsed distal small bowel; no free air; marked intra and extra hepatic biliary ductal dilatation with dilated pancreatic ductal system (?ampullary stricture, occult ampullary mass, incompetent sphincter?) - WBC 11.3; lipase and LFTs WNL  Plan - Admit to medicine for multiple medical problems. Agree with NG tube, NPO, bowel rest, IVF, pain control, antiemetics. Will start patient on small bowel protocol.  Will review CT scan with MD.  Wellington Hampshire, [facility] Surgery 09/01/2016, 9:26 AM Pager: [phone] Consults: [phone] Mon-Fri 7:00 am-4:30 pm Sat-Sun 7:00 am-11:30 am

## 2016-09-01 NOTE — ED Notes (Signed)
Pt is alert and oriented x 4 and is verbally responsive pt states that she has had a ongoing HA worsening x 5 days . Pt denies blurred or impaired vision but does report dizziness. Pt states that she has been having nausea and dry heaving, and diarrhea.

## 2016-09-02 ENCOUNTER — Inpatient Hospital Stay (HOSPITAL_COMMUNITY): Payer: PPO

## 2016-09-02 LAB — CBC
HCT: 29.2 % — ABNORMAL LOW (ref 36.0–46.0)
HEMOGLOBIN: 10 g/dL — AB (ref 12.0–15.0)
MCH: 29.6 pg (ref 26.0–34.0)
MCHC: 34.2 g/dL (ref 30.0–36.0)
MCV: 86.4 fL (ref 78.0–100.0)
PLATELETS: 206 10*3/uL (ref 150–400)
RBC: 3.38 MIL/uL — ABNORMAL LOW (ref 3.87–5.11)
RDW: 13.1 % (ref 11.5–15.5)
WBC: 7.6 10*3/uL (ref 4.0–10.5)

## 2016-09-02 LAB — BASIC METABOLIC PANEL
ANION GAP: 10 (ref 5–15)
BUN: 14 mg/dL (ref 6–20)
CALCIUM: 7.5 mg/dL — AB (ref 8.9–10.3)
CO2: 23 mmol/L (ref 22–32)
CREATININE: 0.64 mg/dL (ref 0.44–1.00)
Chloride: 105 mmol/L (ref 101–111)
GFR calc Af Amer: >60 mL/min (ref >60)
GLUCOSE: 125 mg/dL — AB (ref 65–99)
Potassium: 3.1 mmol/L — ABNORMAL LOW (ref 3.5–5.1)
Sodium: 138 mmol/L (ref 135–145)

## 2016-09-02 MED ORDER — POTASSIUM CHLORIDE 10 MEQ/100ML IV SOLN
10.0000 meq | INTRAVENOUS | Status: AC
  Administered 2016-09-02 (×4): 10 meq via INTRAVENOUS
  Filled 2016-09-02 (×4): qty 100

## 2016-09-02 MED ORDER — LIP MEDEX EX OINT
TOPICAL_OINTMENT | CUTANEOUS | Status: AC
  Administered 2016-09-02: 09:00:00
  Filled 2016-09-02: qty 7

## 2016-09-02 MED ORDER — POTASSIUM CHLORIDE IN NACL 20-0.45 MEQ/L-% IV SOLN
INTRAVENOUS | Status: DC
  Administered 2016-09-02 – 2016-09-07 (×7): via INTRAVENOUS
  Filled 2016-09-02 (×9): qty 1000

## 2016-09-02 NOTE — Progress Notes (Signed)
PROGRESS NOTE  Katrina Rivera KGU:[id number] DOB: 1945/10/09 DOA: 09/01/2016 PCP: [name], MD   LOS: 1 day   Brief Narrative / Interim history: 71 yo female with past medical history significant for MS s/p bowel resection in the 90's due to the MS, addison's disease, fibromyalgia, GERD, and IBS presented to the [facility] ED with c/o X4 days worsening diarrhea with associated abdominal pain, nausea, emesis, dry heaving, anorexia, and HA. Patient reports that the problem started with her diarrhea, so she started to take imodium. She reports her abdominal pain became much worse after taking the imodium and eventually resulted in bloating, abdominal distention, n/v, anorexia, and then dry heaving & HA. She had reportedly made an appointment to see her PCP when the diarrhea first started but felt she couldn't wait and so came into the ED. At that time, patient admitted to decreased appetite X1 month with associated 5lb weight loss. ED imaging was found to be consistent with high grade obstruction. While in the ED, an NGT was placed and general surgery consulted. The patient was then admitted for high grade SBO and dehydration.   Of note, patient's past abdominal surgical history significant for c-sections X2 (1973, 1977), subtotal colectomy (6283), cholecystectomy (1997), and hysterectomy / appendectomy (1998).   Assessment & Plan: Principal Problem:   SBO (small bowel obstruction) (HCC) Active Problems:   MS (multiple sclerosis) (HCC)   Dysautonomia orthostatic hypotension syndrome (HCC)   Addison's disease (HCC)  Small bowel obstruction -CT scan shows high grade SBO, markedly dilated fluid and air-filled stomach and proximal small bowel with collapsed distal small bowel; intra / extra hepatic biliary ductal dilatation with dilated pancreatic ductal system.  -Surgery consulted: agreed on initial plan for NGT, bowel rest, IVF, pain control, antiemetics. -NGT drainage now dark brown. No current  nausea or vomiting. -Continue antiemetics and pain control. -Transition from NPO to clear fluids (d/t improving symptoms).   Dehydration, resolved -Hypokalemic (3.1) in setting of recent emesis and ongoing diarrhea. -Replete potassium to 4.0 -Check magnesium and if necessary replete to 2.0  Mild anemia -H/o transfusion 03/21/16 -Hgb 10.0 -Monitor  Dysautonomia orthostatic hypotension syndrome -Discontinued in ED: florinef BID, leave off PM dose if BP > 150 or standing BP>130  [Per 04/01/16 documentation by Dr. Reather Littler, MD] -BP currently 157/75-194/82. Monitor for hypertension in setting of illness  Multiple sclerosis -09/2013 MRI shows stable findings  -Not on immunomodulation therapy  Addison's disease -Managed by outpatient endocrinology -Continue outpatient medications on discharge   Chronic low back pain, history of lumbar decompression surgery -Spinal cord stimulator placement 02/2016 -Toradol PRN   DVT prophylaxis: SCDs, Lovenox Code Status: Full Family Communication: Husband at bedside Disposition Plan: Home  Consultants:   General surgery  Procedures:   2D echo: None  Foley: None  BiPAP: None  HD: None   Antimicrobials:  None  Subjective: Patient states that she feels better since admission. She did have several episodes of diarrhea last night and two small episodes this morning. She reportedly is not yet having flatus and still feels bloated. Her headache is improved. She states that her abdomen feels less firm / taut and that her abdominal pain is present but improving. She has not had further emesis since admission and does not feel nauseas. She states that she had leg cramps earlier, now resolved with the SCDs.   Objective: Vitals:   09/01/16 0946 09/01/16 1354 09/01/16 2040 09/02/16 0521  BP: (!) 194/82 (!) 171/61 (!) 184/66 (!) 157/75  Pulse: 98 84 87 85  Resp: 19  18 19   Temp: 99.2 F (37.3 C) 98.7 F (37.1 C) 98.3 F (36.8 C) 98.2 F  (36.8 C)  TempSrc: Oral Oral Oral Oral  SpO2: 97% 98% 96% 98%  Weight:      Height:        Intake/Output Summary (Last 24 hours) at 09/02/16 1241 Last data filed at 09/02/16 0649  Gross per 24 hour  Intake          2036.67 ml  Output             1950 ml  Net            86.67 ml   Filed Weights   09/01/16 0127  Weight: 48.1 kg (106 lb)    Examination:  Vitals:   09/01/16 0946 09/01/16 1354 09/01/16 2040 09/02/16 0521  BP: (!) 194/82 (!) 171/61 (!) 184/66 (!) 157/75  Pulse: 98 84 87 85  Resp: 19  18 19   Temp: 99.2 F (37.3 C) 98.7 F (37.1 C) 98.3 F (36.8 C) 98.2 F (36.8 C)  TempSrc: Oral Oral Oral Oral  SpO2: 97% 98% 96% 98%  Weight:      Height:        Constitutional: NAD Eyes: PERRL, lids and conjunctivae normal ENMT: NGT present.  Neck: normal, supple, no masses,  Respiratory: clear to auscultation bilaterally, no wheezing, no crackles. Normal respiratory effort. No accessory muscle use.  Cardiovascular: Regular rate and rhythm, no murmurs / rubs / gallops. No LE edema. 2+ pedal pulses. No carotid bruits.  Abdomen: Mild TTP.  Musculoskeletal: no clubbing / cyanosis. No joint deformity upper and lower extremities. No contractures. Normal muscle tone.  Skin: no rashes, lesions, ulcers. No induration Neurologic: Strength 5/5 in all 4.  Psychiatric: Normal judgment and insight. Alert and oriented x 3. Normal mood.    Data Reviewed: I have independently reviewed following labs and imaging studies  CBC:  Recent Labs Lab 09/01/16 0153 09/02/16 0538  WBC 11.3* 7.6  HGB 13.6 10.0*  HCT 41.0 29.2*  MCV 89.5 86.4  PLT 267 206   Basic Metabolic Panel:  Recent Labs Lab 09/01/16 0153 09/02/16 0538  NA 138 138  K 4.5 3.1*  CL 106 105  CO2 24 23  GLUCOSE 135* 125*  BUN 19 14  CREATININE 0.95 0.64  CALCIUM 9.5 7.5*   GFR: Estimated Creatinine Clearance: 50.8 mL/min (by C-G formula based on SCr of 0.64 mg/dL). Liver Function Tests:  Recent  Labs Lab 09/01/16 0153  AST 25  ALT 18  ALKPHOS 41  BILITOT 0.5  PROT 6.9  ALBUMIN 4.0    Recent Labs Lab 09/01/16 0153  LIPASE 36   No results for input(s): AMMONIA in the last 168 hours. Coagulation Profile: No results for input(s): INR, PROTIME in the last 168 hours. Cardiac Enzymes: No results for input(s): CKTOTAL, CKMB, CKMBINDEX, TROPONINI in the last 168 hours. BNP (last 3 results) No results for input(s): PROBNP in the last 8760 hours. HbA1C: No results for input(s): HGBA1C in the last 72 hours. CBG: No results for input(s): GLUCAP in the last 168 hours. Lipid Profile: No results for input(s): CHOL, HDL, LDLCALC, TRIG, CHOLHDL, LDLDIRECT in the last 72 hours. Thyroid Function Tests: No results for input(s): TSH, T4TOTAL, FREET4, T3FREE, THYROIDAB in the last 72 hours. Anemia Panel: No results for input(s): VITAMINB12, FOLATE, FERRITIN, TIBC, IRON, RETICCTPCT in the last 72 hours. Urine analysis:    Component Value  Date/Time   COLORURINE YELLOW 09/01/2016 0605   APPEARANCEUR CLOUDY (A) 09/01/2016 0605   LABSPEC 1.020 09/01/2016 0605   PHURINE 6.0 09/01/2016 0605   GLUCOSEU NEGATIVE 09/01/2016 0605   GLUCOSEU NEGATIVE 01/21/2016 1628   HGBUR NEGATIVE 09/01/2016 0605   BILIRUBINUR NEGATIVE 09/01/2016 0605   BILIRUBINUR negative 01/21/2016 1212   KETONESUR 5 (A) 09/01/2016 0605   PROTEINUR 30 (A) 09/01/2016 0605   UROBILINOGEN 0.2 01/21/2016 1628   NITRITE NEGATIVE 09/01/2016 0605   LEUKOCYTESUR MODERATE (A) 09/01/2016 0605   Sepsis Labs: Invalid input(s): PROCALCITONIN, LACTICIDVEN  No results found for this or any previous visit (from the past 240 hour(s)).    Radiology Studies: Dg Abdomen 1 View  Result Date: 09/01/2016 CLINICAL DATA:  Nausea vomiting diarrhea since Saturday. EXAM: ABDOMEN - 1 VIEW COMPARISON:  None. FINDINGS: Mild gaseous distention of bowel, more likely nonobstructive. Extensive bowel suture in the pelvic midline. No biliary or  urinary calculi are evident. No free air is evident. IMPRESSION: Mild gaseous distention of bowel, more likely nonobstructive. Electronically Signed   By: Ellery Plunk M.D.   On: 09/01/2016 04:11   Ct Abdomen Pelvis W Contrast  Result Date: 09/01/2016 CLINICAL DATA:  Nausea. Vomiting. Diarrhea. Four days duration. Abnormal urinalysis. EXAM: CT ABDOMEN AND PELVIS WITH CONTRAST TECHNIQUE: Multidetector CT imaging of the abdomen and pelvis was performed using the standard protocol following bolus administration of intravenous contrast. CONTRAST:  100 cc Isovue-300 COMPARISON:  01/01/2014 FINDINGS: Lower chest: Mild atelectasis at the lung bases. Hepatobiliary: Previous cholecystectomy. Pronounced intra and extra hepatic ductal dilatation. No evidence of calcified ductal stone. No ampullary region mass is visible by CT. Pancreas: Dilated pancreatic ductal system as well. In the setting of a dilated biliary ductal system, possibilities include an occult ampullary mass or an incompetent sphincter. Spleen: Normal Adrenals/Urinary Tract: Adrenal glands are normal. Kidneys are normal except for a few tiny cysts. No evidence of mass, stone or hydronephrosis. Bladder appears normal. Stomach/Bowel: Markedly dilated fluid-filled stomach and small intestine consistent with small bowel obstruction. There are collapsed loops of distal small intestine. Patient appears to of head subtotal colectomy. Vascular/Lymphatic: Aortic atherosclerosis. No aneurysm. IVC is normal. No retroperitoneal adenopathy. Reproductive: Previous hysterectomy.  No pelvic mass. Other: Small amount of free intraperitoneal fluid.  No free air. Musculoskeletal: Previous lumbosacral discectomy and fusion L5-S1. IMPRESSION: High-grade small bowel obstruction in this patient has had previous subtotal colectomy. Markedly dilated fluid and air-filled stomach and proximal small bowel with collapsed distal small bowel. Small amount of free fluid, presumably  secondary to the small bowel obstruction. No free air. Marked intra and extra hepatic biliary ductal dilatation in this patient has had previous cholecystectomy. Dilated pancreatic ductal system. Differential diagnosis includes ampullary stricture, occult ampullary mass, and incompetent sphincter in the setting of high-grade small bowel obstruction. Aortic atherosclerosis. Electronically Signed   By: Paulina Fusi M.D.   On: 09/01/2016 08:12   Dg Abd Portable 1v  Result Date: 09/02/2016 CLINICAL DATA:  Small bowel obstruction EXAM: PORTABLE ABDOMEN - 1 VIEW COMPARISON:  09/01/2016 FINDINGS: NG tube is in stable position. Continued dilated small bowel loops compatible with small bowel obstruction. Oral contrast material is noted within the rectosigmoid colon. Prior cholecystectomy. IMPRESSION: Continued partial small bowel obstruction pattern. No significant change. Electronically Signed   By: Charlett Nose M.D.   On: 09/02/2016 07:42   Dg Abd Portable 1v-small Bowel Obstruction Protocol-initial, 8 Hr Delay  Result Date: 09/01/2016 CLINICAL DATA:  Small bowel obstruction protocol. EXAM:  PORTABLE ABDOMEN - 1 VIEW COMPARISON:  09/01/2016 FINDINGS: Oral contrast is seen within mildly dilated small small bowel loops in the right abdomen. Scattered contrast is seen within the left colon. Postsurgical changes in the pelvis. Residual contrast within the urinary bladder. IMPRESSION: Residual small bowel contrast within mildly dilated loops in the right mid abdomen. Electronically Signed   By: Ted Mcalpine M.D.   On: 09/01/2016 20:08   Dg Abd Portable 1v-small Bowel Protocol-position Verification  Result Date: 09/01/2016 CLINICAL DATA:  NG tube placement EXAM: PORTABLE ABDOMEN - 1 VIEW COMPARISON:  CT abdomen 09/01/2016 FINDINGS: There is gaseous distension of the small bowel most consistent with small bowel obstruction. Nasogastric tube with the tip projecting over the stomach. There is no evidence of  pneumoperitoneum, portal venous gas or pneumatosis. There are no pathologic calcifications along the expected course of the ureters. Posterior spinal fusion at L5-S1. IMPRESSION: Gaseous distension of the small bowel most consistent with small bowel obstruction. Nasogastric tube with the tip projecting over the stomach. Electronically Signed   By: Elige Ko   On: 09/01/2016 11:15     Scheduled Meds: . chlorhexidine  15 mL Mouth Rinse BID  . enoxaparin (LOVENOX) injection  40 mg Subcutaneous Q24H  . lip balm      . mouth rinse  15 mL Mouth Rinse q12n4p  . methylPREDNISolone (SOLU-MEDROL) injection  40 mg Intravenous Q12H  . traZODone  200 mg Oral QHS   Continuous Infusions: . 0.45 % NaCl with KCl 20 mEq / L 75 mL/hr at 09/02/16 1006  . potassium chloride 10 mEq (09/02/16 1218)      Marisue Ivan, Student-PA  09/02/2016 3:19 PM

## 2016-09-02 NOTE — Progress Notes (Signed)
Patient ID: Katrina Rivera, female   DOB: [birth year], 71 y.o.   MRN: [id number]  [facility] Surgery Progress Note     Subjective: CC- SBO Patient states that she had at least 2-3 loose BM's over night. She feels less distended and is passing flatus. Denies n/v.  XR this AM shows continued partial SBO pattern, contrast in the rectosigmoid colon.  Objective: Vital signs in last 24 hours: Temp:  [98.2 F (36.8 C)-99.2 F (37.3 C)] 98.2 F (36.8 C) (07/27 0521) Pulse Rate:  [84-98] 85 (07/27 0521) Resp:  [18-19] 19 (07/27 0521) BP: (157-194)/(61-82) 157/75 (07/27 0521) SpO2:  [96 %-98 %] 98 % (07/27 0521) Last BM Date: 09/02/16  Intake/Output from previous day: 07/26 0701 - 07/27 0700 In: 2036.7 [I.V.:2036.7] Out: 1952 [Urine:901; Emesis/NG output:1050; Stool:1] Intake/Output this shift: No intake/output data recorded.  PE: Gen:  Alert, NAD, pleasant HEENT: EOM's intact, pupils equal and round Card:  RRR, no M/G/R heard Pulm:  CTAB, no W/R/R, effort normal Abd: well healed midline/lower transverse/mulitple lap incisions, soft, mild distension, +BS, no masses, hernias, or organomegaly. Mild lower abdominal tenderness Ext:  No erythema, edema, or tenderness BUE/BLE  Psych: A&Ox3  Skin: no rashes noted, warm and dry  Lab Results:   Recent Labs  09/01/16 0153 09/02/16 0538  WBC 11.3* 7.6  HGB 13.6 10.0*  HCT 41.0 29.2*  PLT 267 206   BMET  Recent Labs  09/01/16 0153 09/02/16 0538  NA 138 138  K 4.5 3.1*  CL 106 105  CO2 24 23  GLUCOSE 135* 125*  BUN 19 14  CREATININE 0.95 0.64  CALCIUM 9.5 7.5*   PT/INR No results for input(s): LABPROT, INR in the last 72 hours. CMP     Component Value Date/Time   NA 138 09/02/2016 0538   K 3.1 (L) 09/02/2016 0538   CL 105 09/02/2016 0538   CO2 23 09/02/2016 0538   GLUCOSE 125 (H) 09/02/2016 0538   BUN 14 09/02/2016 0538   CREATININE 0.64 09/02/2016 0538   CREATININE 0.85 11/06/2013 0840   CALCIUM 7.5 (L)  09/02/2016 0538   PROT 6.9 09/01/2016 0153   ALBUMIN 4.0 09/01/2016 0153   AST 25 09/01/2016 0153   ALT 18 09/01/2016 0153   ALKPHOS 41 09/01/2016 0153   BILITOT 0.5 09/01/2016 0153   GFRNONAA >60 09/02/2016 0538   GFRAA >60 09/02/2016 0538   Lipase     Component Value Date/Time   LIPASE 36 09/01/2016 0153       Studies/Results: Dg Abdomen 1 View  Result Date: 09/01/2016 CLINICAL DATA:  Nausea vomiting diarrhea since Saturday. EXAM: ABDOMEN - 1 VIEW COMPARISON:  None. FINDINGS: Mild gaseous distention of bowel, more likely nonobstructive. Extensive bowel suture in the pelvic midline. No biliary or urinary calculi are evident. No free air is evident. IMPRESSION: Mild gaseous distention of bowel, more likely nonobstructive. Electronically Signed   By: Ellery Plunk M.D.   On: 09/01/2016 04:11   Ct Abdomen Pelvis W Contrast  Result Date: 09/01/2016 CLINICAL DATA:  Nausea. Vomiting. Diarrhea. Four days duration. Abnormal urinalysis. EXAM: CT ABDOMEN AND PELVIS WITH CONTRAST TECHNIQUE: Multidetector CT imaging of the abdomen and pelvis was performed using the standard protocol following bolus administration of intravenous contrast. CONTRAST:  100 cc Isovue-300 COMPARISON:  01/01/2014 FINDINGS: Lower chest: Mild atelectasis at the lung bases. Hepatobiliary: Previous cholecystectomy. Pronounced intra and extra hepatic ductal dilatation. No evidence of calcified ductal stone. No ampullary region mass is visible by CT. Pancreas:  Dilated pancreatic ductal system as well. In the setting of a dilated biliary ductal system, possibilities include an occult ampullary mass or an incompetent sphincter. Spleen: Normal Adrenals/Urinary Tract: Adrenal glands are normal. Kidneys are normal except for a few tiny cysts. No evidence of mass, stone or hydronephrosis. Bladder appears normal. Stomach/Bowel: Markedly dilated fluid-filled stomach and small intestine consistent with small bowel obstruction. There  are collapsed loops of distal small intestine. Patient appears to of head subtotal colectomy. Vascular/Lymphatic: Aortic atherosclerosis. No aneurysm. IVC is normal. No retroperitoneal adenopathy. Reproductive: Previous hysterectomy.  No pelvic mass. Other: Small amount of free intraperitoneal fluid.  No free air. Musculoskeletal: Previous lumbosacral discectomy and fusion L5-S1. IMPRESSION: High-grade small bowel obstruction in this patient has had previous subtotal colectomy. Markedly dilated fluid and air-filled stomach and proximal small bowel with collapsed distal small bowel. Small amount of free fluid, presumably secondary to the small bowel obstruction. No free air. Marked intra and extra hepatic biliary ductal dilatation in this patient has had previous cholecystectomy. Dilated pancreatic ductal system. Differential diagnosis includes ampullary stricture, occult ampullary mass, and incompetent sphincter in the setting of high-grade small bowel obstruction. Aortic atherosclerosis. Electronically Signed   By: Paulina Fusi M.D.   On: 09/01/2016 08:12   Dg Abd Portable 1v  Result Date: 09/02/2016 CLINICAL DATA:  Small bowel obstruction EXAM: PORTABLE ABDOMEN - 1 VIEW COMPARISON:  09/01/2016 FINDINGS: NG tube is in stable position. Continued dilated small bowel loops compatible with small bowel obstruction. Oral contrast material is noted within the rectosigmoid colon. Prior cholecystectomy. IMPRESSION: Continued partial small bowel obstruction pattern. No significant change. Electronically Signed   By: Charlett Nose M.D.   On: 09/02/2016 07:42   Dg Abd Portable 1v-small Bowel Obstruction Protocol-initial, 8 Hr Delay  Result Date: 09/01/2016 CLINICAL DATA:  Small bowel obstruction protocol. EXAM: PORTABLE ABDOMEN - 1 VIEW COMPARISON:  09/01/2016 FINDINGS: Oral contrast is seen within mildly dilated small small bowel loops in the right abdomen. Scattered contrast is seen within the left colon. Postsurgical  changes in the pelvis. Residual contrast within the urinary bladder. IMPRESSION: Residual small bowel contrast within mildly dilated loops in the right mid abdomen. Electronically Signed   By: Ted Mcalpine M.D.   On: 09/01/2016 20:08   Dg Abd Portable 1v-small Bowel Protocol-position Verification  Result Date: 09/01/2016 CLINICAL DATA:  NG tube placement EXAM: PORTABLE ABDOMEN - 1 VIEW COMPARISON:  CT abdomen 09/01/2016 FINDINGS: There is gaseous distension of the small bowel most consistent with small bowel obstruction. Nasogastric tube with the tip projecting over the stomach. There is no evidence of pneumoperitoneum, portal venous gas or pneumatosis. There are no pathologic calcifications along the expected course of the ureters. Posterior spinal fusion at L5-S1. IMPRESSION: Gaseous distension of the small bowel most consistent with small bowel obstruction. Nasogastric tube with the tip projecting over the stomach. Electronically Signed   By: Elige Ko   On: 09/01/2016 11:15    Anti-infectives: Anti-infectives    None       Assessment/Plan Multiple sclerosis Addison's disease GERD Chronic low back pain  SBO - abdominal surgical history: subtotal colectomy 1990 by Dr. Lauralee Evener; cholecystectomy 1997; c sections x2 in 1973 and 1977; hysterectomy/appendectomy 1988 - CT scan shows high-grade SBO, markedly dilated fluid and air-filled stomach and proximal small bowel with collapsed distal small bowel; no free air; marked intra and extra hepatic biliary ductal dilatation with dilated pancreatic ductal system (?ampullary stricture, occult ampullary mass, incompetent sphincter?) - WBC 7.6, TMAX  99.2 - XR today shows continued partial SBO pattern, contrast in the rectosigmoid colon - NG tube with 1050cc/24hr - bowel function returning  Plan - XR shows contrast in the colon, persistent partial SBO. Bowel function returning. Will clamp NG tube today and give clear liquids. Please  assist patient with ambulation.   LOS: 1 day    Franne Forts , [facility] Surgery 09/02/2016, 8:33 AM Pager: [phone] Consults: [phone] Mon-Fri 7:00 am-4:30 pm Sat-Sun 7:00 am-11:30 am

## 2016-09-02 NOTE — Care Management Note (Signed)
Case Management Note  Patient Details  Name: Katrina Rivera MRN: [id number] Date of Birth: 08-02-1945  Subjective/Objective:     abd pain /npo with ngtube to suction and iv flds               Action/Plan: Date:  September 02, 2016 Chart reviewed for concurrent status and case management needs. Will continue to follow patient progress. Discharge Planning: following for needs Expected discharge date: [id number] Marcelle Smiling, BSN, [place], [place]   [phone]  Expected Discharge Date:   (unknown)               Expected Discharge Plan:  Home/Self Care  In-House Referral:     Discharge planning Services  CM Consult  Post Acute Care Choice:    Choice offered to:     DME Arranged:    DME Agency:     HH Arranged:    HH Agency:     Status of Service:  In process, will continue to follow  If discussed at Long Length of Stay Meetings, dates discussed:    Additional Comments:  Golda Acre, RN 09/02/2016, 8:14 AM

## 2016-09-03 LAB — BASIC METABOLIC PANEL
ANION GAP: 7 (ref 5–15)
BUN: 10 mg/dL (ref 6–20)
CALCIUM: 8.2 mg/dL — AB (ref 8.9–10.3)
CO2: 25 mmol/L (ref 22–32)
Chloride: 106 mmol/L (ref 101–111)
Creatinine, Ser: 0.69 mg/dL (ref 0.44–1.00)
Glucose, Bld: 107 mg/dL — ABNORMAL HIGH (ref 65–99)
Potassium: 4.1 mmol/L (ref 3.5–5.1)
SODIUM: 138 mmol/L (ref 135–145)

## 2016-09-03 LAB — CBC WITH DIFFERENTIAL/PLATELET
BASOS ABS: 0 10*3/uL (ref 0.0–0.1)
BASOS PCT: 0 %
EOS ABS: 0 10*3/uL (ref 0.0–0.7)
EOS PCT: 0 %
HEMATOCRIT: 31.4 % — AB (ref 36.0–46.0)
Hemoglobin: 10.5 g/dL — ABNORMAL LOW (ref 12.0–15.0)
Lymphocytes Relative: 13 %
Lymphs Abs: 1.5 10*3/uL (ref 0.7–4.0)
MCH: 29.2 pg (ref 26.0–34.0)
MCHC: 33.4 g/dL (ref 30.0–36.0)
MCV: 87.2 fL (ref 78.0–100.0)
MONO ABS: 1 10*3/uL (ref 0.1–1.0)
Monocytes Relative: 8 %
NEUTROS ABS: 9.6 10*3/uL — AB (ref 1.7–7.7)
Neutrophils Relative %: 79 %
PLATELETS: 277 10*3/uL (ref 150–400)
RBC: 3.6 MIL/uL — ABNORMAL LOW (ref 3.87–5.11)
RDW: 13.3 % (ref 11.5–15.5)
WBC: 12.1 10*3/uL — ABNORMAL HIGH (ref 4.0–10.5)

## 2016-09-03 LAB — MAGNESIUM: MAGNESIUM: 1.7 mg/dL (ref 1.7–2.4)

## 2016-09-03 MED ORDER — HYDROCODONE-ACETAMINOPHEN 5-325 MG PO TABS: 2.0000 | ORAL_TABLET | Freq: Once | ORAL | Status: DC

## 2016-09-03 MED ORDER — PROMETHAZINE HCL 25 MG/ML IJ SOLN
12.5000 mg | Freq: Once | INTRAMUSCULAR | Status: AC
  Administered 2016-09-03: 12.5 mg via INTRAVENOUS
  Filled 2016-09-03: qty 1

## 2016-09-03 MED ORDER — PROMETHAZINE HCL 25 MG/ML IJ SOLN
12.5000 mg | Freq: Once | INTRAMUSCULAR | Status: AC
  Administered 2016-09-04: 12.5 mg via INTRAVENOUS
  Filled 2016-09-03: qty 1

## 2016-09-03 MED ORDER — METHYLPREDNISOLONE SODIUM SUCC 40 MG IJ SOLR
40.0000 mg | Freq: Every day | INTRAMUSCULAR | Status: DC
  Administered 2016-09-04 – 2016-09-05 (×2): 40 mg via INTRAVENOUS
  Filled 2016-09-03 (×2): qty 1

## 2016-09-03 NOTE — Progress Notes (Signed)
PROGRESS NOTE [facility]   Katrina Rivera   XIH:[id number] DOB: [birth year]  DOA: 09/01/2016 PCP: [name], MD   Brief Narrative:  Patient is 71 year old female with past medical history MS status post bowel resection, as this is, fibromyalgia, gutters and diabetes who presented to the emergency department complaining of abdominal pain and nausea and vomiting. Upon ED evaluation she was found to have small bowel obstruction and was admitted for bowel rest and NG tube placements. Surgery was consulted and recommended conservative management.  Subjective: Patient seen and examined, report loose stools and passing gas, tolerating full liquid well. No abdominal pain, nausea or vomiting.  Assessment & Plan: Small bowel obstruction - Improving  In view of of abdominal surgeries in the past. Surgery was consulted and recommended small bowel protocol and conservative of management. Advance diet to clear liquids, is tolerating in the morning will advance to full liquid. Advice patient to ambulate Remove NG tube is tolerating diet When necessary pain medication  Addison disease Continue stress dose steroids Continue to monitor   Elevated blood pressure without diagnosis of hypertension Patient have a diagnosis of dysautonomia orthostatic hypotension syndrome, and she has been Florinef which was placed on hold.  BP has remained stable  DVT prophylaxis: Lovenox Code Status: Full code Family Communication: Husband at bedside Disposition Plan: Hopefully home in the next 24 hours  Consultants:   Gen. surgery  Procedures:   None  Antimicrobials: Anti-infectives    None       Objective: Vitals:   09/02/16 1400 09/02/16 2013 09/03/16 0545 09/03/16 1409  BP: (!) 143/66 (!) 169/77 (!) 153/94 (!) 149/73  Pulse: 86 83 86 84  Resp: 18 18 18 18   Temp: 97.6 F (36.4 C) 98.6 F (37 C) 98.4 F (36.9 C) 98.1 F (36.7 C)  TempSrc: Oral Oral Oral Oral  SpO2: 100%  98% 100% 100%  Weight:      Height:        Intake/Output Summary (Last 24 hours) at 09/03/16 1558 Last data filed at 09/03/16 0600  Gross per 24 hour  Intake           1492.5 ml  Output             1100 ml  Net            392.5 ml   Filed Weights   09/01/16 0127  Weight: 48.1 kg (106 lb)    Examination: General: Pt is alert, awake, not in acute distress Cardiovascular: RRR, S1/S2 +, no rubs, no gallops Respiratory: CTA bilaterally, no wheezing, no rhonchi Abdominal: Soft, NT, ND, bowel sounds + Extremities: no edema, no cyanosis  Data Reviewed: I have personally reviewed following labs and imaging studies  CBC:  Recent Labs Lab 09/01/16 0153 09/02/16 0538 09/03/16 0538  WBC 11.3* 7.6 12.1*  NEUTROABS  --   --  9.6*  HGB 13.6 10.0* 10.5*  HCT 41.0 29.2* 31.4*  MCV 89.5 86.4 87.2  PLT 267 206 277   Basic Metabolic Panel:  Recent Labs Lab 09/01/16 0153 09/02/16 0538 09/03/16 0538  NA 138 138 138  K 4.5 3.1* 4.1  CL 106 105 106  CO2 24 23 25   GLUCOSE 135* 125* 107*  BUN 19 14 10   CREATININE 0.95 0.64 0.69  CALCIUM 9.5 7.5* 8.2*  MG  --   --  1.7   GFR: Estimated Creatinine Clearance: 50.8 mL/min (by C-G formula based on SCr of 0.69 mg/dL). Liver Function Tests:  Recent Labs Lab 09/01/16 0153  AST 25  ALT 18  ALKPHOS 41  BILITOT 0.5  PROT 6.9  ALBUMIN 4.0    Recent Labs Lab 09/01/16 0153  LIPASE 36   No results for input(s): AMMONIA in the last 168 hours. Coagulation Profile: No results for input(s): INR, PROTIME in the last 168 hours. Cardiac Enzymes: No results for input(s): CKTOTAL, CKMB, CKMBINDEX, TROPONINI in the last 168 hours. BNP (last 3 results) No results for input(s): PROBNP in the last 8760 hours. HbA1C: No results for input(s): HGBA1C in the last 72 hours. CBG: No results for input(s): GLUCAP in the last 168 hours. Lipid Profile: No results for input(s): CHOL, HDL, LDLCALC, TRIG, CHOLHDL, LDLDIRECT in the last 72  hours. Thyroid Function Tests: No results for input(s): TSH, T4TOTAL, FREET4, T3FREE, THYROIDAB in the last 72 hours. Anemia Panel: No results for input(s): VITAMINB12, FOLATE, FERRITIN, TIBC, IRON, RETICCTPCT in the last 72 hours. Sepsis Labs: No results for input(s): PROCALCITON, LATICACIDVEN in the last 168 hours.  No results found for this or any previous visit (from the past 240 hour(s)).    Radiology Studies: Dg Abd Portable 1v  Result Date: 09/02/2016 CLINICAL DATA:  Small bowel obstruction EXAM: PORTABLE ABDOMEN - 1 VIEW COMPARISON:  09/01/2016 FINDINGS: NG tube is in stable position. Continued dilated small bowel loops compatible with small bowel obstruction. Oral contrast material is noted within the rectosigmoid colon. Prior cholecystectomy. IMPRESSION: Continued partial small bowel obstruction pattern. No significant change. Electronically Signed   By: Charlett Nose M.D.   On: 09/02/2016 07:42   Dg Abd Portable 1v-small Bowel Obstruction Protocol-initial, 8 Hr Delay  Result Date: 09/01/2016 CLINICAL DATA:  Small bowel obstruction protocol. EXAM: PORTABLE ABDOMEN - 1 VIEW COMPARISON:  09/01/2016 FINDINGS: Oral contrast is seen within mildly dilated small small bowel loops in the right abdomen. Scattered contrast is seen within the left colon. Postsurgical changes in the pelvis. Residual contrast within the urinary bladder. IMPRESSION: Residual small bowel contrast within mildly dilated loops in the right mid abdomen. Electronically Signed   By: Ted Mcalpine M.D.   On: 09/01/2016 20:08      Scheduled Meds: . chlorhexidine  15 mL Mouth Rinse BID  . enoxaparin (LOVENOX) injection  40 mg Subcutaneous Q24H  . mouth rinse  15 mL Mouth Rinse q12n4p  . methylPREDNISolone (SOLU-MEDROL) injection  40 mg Intravenous Q12H  . traZODone  200 mg Oral QHS   Continuous Infusions: . 0.45 % NaCl with KCl 20 mEq / L 75 mL/hr at 09/03/16 1245     LOS: 2 days    Time spent: Total of  15 minutes spent with pt, greater than 50% of which was spent in discussion of  treatment, counseling and coordination of care  Latrelle Dodrill, MD Pager: Text Page via [email or web address]  [phone]  If 7PM-7AM, please contact night-coverage [email or web address] Password [facility] 09/03/2016, 3:58 PM

## 2016-09-03 NOTE — Progress Notes (Signed)
Patient ID: Katrina Rivera, female   DOB: April 22, 1945, 71 y.o.   MRN: [id number]   Progress Note: [facility] Acute Care Surgery Service   Chief Complaint/Subjective: No pain. Having loose stools. No n/v. No burping. Clears went well. Just started fulls.   Objective: Vital signs in last 24 hours: Temp:  [97.6 F (36.4 C)-98.6 F (37 C)] 98.4 F (36.9 C) (07/28 0545) Pulse Rate:  [83-86] 86 (07/28 0545) Resp:  [18] 18 (07/28 0545) BP: (143-169)/(66-94) 153/94 (07/28 0545) SpO2:  [98 %-100 %] 100 % (07/28 0545) Last BM Date: 09/02/16  Intake/Output from previous day: 07/27 0701 - 07/28 0700 In: 1492.5 [I.V.:1492.5] Out: 1300 [Urine:1100; Emesis/NG output:200] Intake/Output this shift: No intake/output data recorded.  Lungs: cta  Cardiovascular: reg  Abd: soft, mild distension, nt  Extremities: no edema  Neuro: nonfocal  Lab Results: CBC   Recent Labs  09/02/16 0538 09/03/16 0538  WBC 7.6 12.1*  HGB 10.0* 10.5*  HCT 29.2* 31.4*  PLT 206 277   BMET  Recent Labs  09/02/16 0538 09/03/16 0538  NA 138 138  K 3.1* 4.1  CL 105 106  CO2 23 25  GLUCOSE 125* 107*  BUN 14 10  CREATININE 0.64 0.69  CALCIUM 7.5* 8.2*   PT/INR No results for input(s): LABPROT, INR in the last 72 hours. ABG No results for input(s): PHART, HCO3 in the last 72 hours.  Invalid input(s): PCO2, PO2  Studies/Results:  Anti-infectives: Anti-infectives    None      Medications: Scheduled Meds: . chlorhexidine  15 mL Mouth Rinse BID  . enoxaparin (LOVENOX) injection  40 mg Subcutaneous Q24H  . mouth rinse  15 mL Mouth Rinse q12n4p  . methylPREDNISolone (SOLU-MEDROL) injection  40 mg Intravenous Q12H  . traZODone  200 mg Oral QHS   Continuous Infusions: . 0.45 % NaCl with KCl 20 mEq / L 75 mL/hr at 09/02/16 1006   PRN Meds:.ketorolac, ondansetron **OR** ondansetron (ZOFRAN) IV  Assessment/Plan: Patient Active Problem List   Diagnosis Date Noted  . SBO (small bowel  obstruction) (HCC) 09/01/2016  . Fibromyalgia   . Addison's disease (HCC)   . Abdominal pain, vomiting, and diarrhea   . Dehydration   . Somnolence, daytime 10/22/2015  . Fatigue 10/22/2015  . Chronic leg pain 06/29/2015  . S/P lumbar spinal fusion 06/18/2014  . Abnormality of gait 02/18/2013  . S/P lumbar microdiscectomy 01/24/2013  . Hypokalemia 12/24/2012  . Sweating abnormality 10/19/2012  . Dysautonomia orthostatic hypotension syndrome (HCC) 08/16/2012  . Hereditary and idiopathic peripheral neuropathy 08/16/2012  . Chronic adrenal insufficiency (HCC) 08/16/2012  . MS (multiple sclerosis) (HCC) 08/15/2012   s/p    Multiple sclerosis Addison's disease GERD Chronic low back pain  SBO - abdominal surgical history: subtotal colectomy 1990 by Dr. Lauralee Evener; cholecystectomy 1997; c sections x2 in 1973 and 1977; hysterectomy/appendectomy 1988 - CT scan shows high-grade SBO, markedly dilated fluid and air-filled stomach and proximal small bowel with collapsed distal small bowel; no free air; marked intra and extra hepatic biliary ductal dilatation with dilated pancreatic ductal system (?ampullary stricture, occult ampullary mass, incompetent sphincter?)  No fever, no tachycardia, small bump in wbc but having BMs, less pain, no n/v. Tolerating diet.     Disposition: Cont fulls today. If tolerates fulls can have soft diet in AM. prob home Sunday afternoon Discussed SBO with pt and husband and general dietary advice after SBO.   Will need outpt f/u about panc ductal system   LOS: 2 days  Gayland Curry, MD [phone] [facility]

## 2016-09-04 ENCOUNTER — Inpatient Hospital Stay (HOSPITAL_COMMUNITY): Payer: PPO

## 2016-09-04 DIAGNOSIS — R030 Elevated blood-pressure reading, without diagnosis of hypertension: Secondary | ICD-10-CM

## 2016-09-04 LAB — CBC
HCT: 31.7 % — ABNORMAL LOW (ref 36.0–46.0)
Hemoglobin: 10.6 g/dL — ABNORMAL LOW (ref 12.0–15.0)
MCH: 28.7 pg (ref 26.0–34.0)
MCHC: 33.4 g/dL (ref 30.0–36.0)
MCV: 85.9 fL (ref 78.0–100.0)
Platelets: 300 10*3/uL (ref 150–400)
RBC: 3.69 MIL/uL — ABNORMAL LOW (ref 3.87–5.11)
RDW: 13.3 % (ref 11.5–15.5)
WBC: 13.5 10*3/uL — AB (ref 4.0–10.5)

## 2016-09-04 MED ORDER — HYDRALAZINE HCL 20 MG/ML IJ SOLN
5.0000 mg | Freq: Four times a day (QID) | INTRAMUSCULAR | Status: DC | PRN
  Administered 2016-09-04: 5 mg via INTRAVENOUS
  Filled 2016-09-04: qty 0.25

## 2016-09-04 MED ORDER — FAMOTIDINE IN NACL 20-0.9 MG/50ML-% IV SOLN
20.0000 mg | Freq: Once | INTRAVENOUS | Status: AC
  Administered 2016-09-04: 20 mg via INTRAVENOUS
  Filled 2016-09-04: qty 50

## 2016-09-04 MED ORDER — DIPHENHYDRAMINE HCL 50 MG/ML IJ SOLN
25.0000 mg | Freq: Once | INTRAMUSCULAR | Status: AC
  Administered 2016-09-04: 25 mg via INTRAVENOUS
  Filled 2016-09-04: qty 1

## 2016-09-04 MED ORDER — LORAZEPAM 2 MG/ML IJ SOLN: 1.0000 mg | INTRAMUSCULAR | Status: AC

## 2016-09-04 MED ORDER — PROMETHAZINE HCL 25 MG/ML IJ SOLN
12.5000 mg | Freq: Four times a day (QID) | INTRAMUSCULAR | Status: DC | PRN
  Administered 2016-09-04: 12.5 mg via INTRAVENOUS
  Filled 2016-09-04: qty 1

## 2016-09-04 NOTE — Progress Notes (Signed)
This shift attending has been notified of the systolic BP  Sustaining above 850'Y. No new orders at this time

## 2016-09-04 NOTE — Progress Notes (Signed)
This shift pt expelled 250 mls brown emesis. Abdomen, RUQ tender to touch, no distention. Pt states she has been burping but no gas. Attending notified. Phenergan ordered, will continue to monitor

## 2016-09-04 NOTE — Progress Notes (Signed)
Patient ID: Katrina Rivera, female   DOB: 08/10/1945, 70 y.o.   MRN: [id number]   Acute Care Surgery Service Progress Note:    Chief Complaint/Subjective: Threw up twice yesterday/overnight. 2nd time was worse and associated with sharp RLQ pain. NG placed in distal esoph and nurse removed it. Pt teary about potential need for surgery and NG. Some flatus. +nausea. RLQ pain greatly improved.   Objective: Vital signs in last 24 hours: Temp:  [97.8 F (36.6 C)-98.7 F (37.1 C)] 98.7 F (37.1 C) (07/29 0559) Pulse Rate:  [79-95] 79 (07/29 0559) Resp:  [18-20] 19 (07/29 0559) BP: (149-198)/(73-97) 169/80 (07/29 0559) SpO2:  [98 %-100 %] 98 % (07/29 0559) Weight:  [46.7 kg (102 lb 14.4 oz)] 46.7 kg (102 lb 14.4 oz) (07/29 0559) Last BM Date: 09/03/16  Intake/Output from previous day: 07/28 0701 - 07/29 0700 In: 1490 [P.O.:720; I.V.:770] Out: -  Intake/Output this shift: No intake/output data recorded.  Lungs: cta, nonlabored  Cardiovascular: reg  Abd: soft, mild distension, mild TTP, definitely no rebound/guarding/peritonitis  Extremities: no edema, +SCDs  Neuro: alert, nonfocal  Lab Results: CBC   Recent Labs  09/03/16 0538 09/04/16 0801  WBC 12.1* 13.5*  HGB 10.5* 10.6*  HCT 31.4* 31.7*  PLT 277 300   BMET  Recent Labs  09/02/16 0538 09/03/16 0538  NA 138 138  K 3.1* 4.1  CL 105 106  CO2 23 25  GLUCOSE 125* 107*  BUN 14 10  CREATININE 0.64 0.69  CALCIUM 7.5* 8.2*   LFT Hepatic Function Latest Ref Rng & Units 09/01/2016 03/10/2016 03/01/2016  Total Protein 6.5 - 8.1 g/dL 6.9 6.4 6.7  Albumin 3.5 - 5.0 g/dL 4.0 4.3 3.9  AST 15 - 41 U/L 25 18 22  ALT 14 - 54 U/L 18 16 13(L)  Alk Phosphatase 38 - 126 U/L 41 39 38  Total Bilirubin 0.3 - 1.2 mg/dL 0.5 0.3 0.3   PT/INR No results for input(s): LABPROT, INR in the last 72 hours. ABG No results for input(s): PHART, HCO3 in the last 72 hours.  Invalid input(s): PCO2,  PO2  Studies/Results:  Anti-infectives: Anti-infectives    None      Medications: Scheduled Meds: . chlorhexidine  15 mL Mouth Rinse BID  . enoxaparin (LOVENOX) injection  40 mg Subcutaneous Q24H  . HYDROcodone-acetaminophen  2 tablet Oral Once  . LORazepam  1 mg Intravenous On Call  . mouth rinse  15 mL Mouth Rinse q12n4p  . methylPREDNISolone (SOLU-MEDROL) injection  40 mg Intravenous Daily  . traZODone  200 mg Oral QHS   Continuous Infusions: . 0.45 % NaCl with KCl 20 mEq / L 75 mL/hr at 09/03/16 1245   PRN Meds:.ketorolac, ondansetron **OR** ondansetron (ZOFRAN) IV, promethazine  Assessment/Plan: Patient Active Problem List   Diagnosis Date Noted  . SBO (small bowel obstruction) (HCC) 09/01/2016  . Fibromyalgia   . Addison's disease (HCC)   . Abdominal pain, vomiting, and diarrhea   . Dehydration   . Somnolence, daytime 10/22/2015  . Fatigue 10/22/2015  . Chronic leg pain 06/29/2015  . S/P lumbar spinal fusion 06/18/2014  . Abnormality of gait 02/18/2013  . S/P lumbar microdiscectomy 01/24/2013  . Hypokalemia 12/24/2012  . Sweating abnormality 10/19/2012  . Dysautonomia orthostatic hypotension syndrome (HCC) 08/16/2012  . Hereditary and idiopathic peripheral neuropathy 08/16/2012  . Chronic adrenal insufficiency (HCC) 08/16/2012  . MS (multiple sclerosis) (HCC) 08/15/2012   Imaging reviewed.   Persistent psbo Long discussion with pt and husband.   Pt reports several month h/o nausea and limited appetite.   Pt is very apprehensive about having NG placed.   If she vomits again and/or worsening pain, I got her to agree to having it placed. Can give low dose ativan to help with anxiety.  Add low dose phenergan for nausea  Although wbc is up some, she is getting steroid injections for her MS o/w she is afebrile, not tachycardiac, with soft min tenderness to abd. Therefore I think it is reasonable to give her another day.  Discussed indications for surgery -  clinically worsening and/or failure to improve.   Repeat labs/imaging in am Place NG for vomiting/worsening pain   LOS: 3 days    Eric M. Wilson, MD, FACS General, Bariatric, & Minimally Invasive Surgery [phone] [facility]  

## 2016-09-04 NOTE — Progress Notes (Signed)
PROGRESS NOTE [facility]   Katrina Rivera   JYN:[id number] DOB: [birth year]  DOA: 09/01/2016 PCP: [name], MD   Brief Narrative:  Patient is 71 year old female with past medical history MS status post bowel resection, as this is, fibromyalgia, gutters and diabetes who presented to the emergency department complaining of abdominal pain and nausea and vomiting. Upon ED evaluation she was found to have small bowel obstruction and was admitted for bowel rest and NG tube placements. Surgery was consulted and recommended conservative management.  Subjective: Patient seen and examined, vomiting overnight, NGT placement was in distal esophagus and nurse remove it. She reports some flatus but sure. Continues to be nauseous although right lower quadrant pain has improved.  Assessment & Plan: Small bowel obstruction - recurrent/persistent In view of of abdominal surgeries in the past. Continue management per surgery - possible surgical management needed Returned to nothing by mouth Advice patient to ambulate If continues to vomit reinsert NGT  Leukocytosis Secondary to high-dose steroid, she is receiving IV steroids for Addison's disease No signs of infection  Addison disease Continue stress dose steroids Continue to monitor   Elevated blood pressure without diagnosis of hypertension Patient remains with elevated blood pressure I suspect that is too high dose steroids, we'll continue to monitor for now We'll add hydralazine as needed for SBP 170. Continue monitor blood pressure for now  DVT prophylaxis: Lovenox Code Status: Full code Family Communication: Husband at bedside Disposition Plan: Unable to determine, patient have a setback now with recurrent SBO.  Consultants:   Gen. surgery  Procedures:   None  Antimicrobials: Anti-infectives    None      Objective: Vitals:   09/03/16 2140 09/04/16 0106 09/04/16 0559 09/04/16 1400  BP: (!) 188/83 (!)  198/97 (!) 169/80 (!) 158/78  Pulse:  95 79 81  Resp:  18 19 18   Temp:  97.8 F (36.6 C) 98.7 F (37.1 C) 98.4 F (36.9 C)  TempSrc:  Oral Oral Oral  SpO2:  100% 98% 100%  Weight:   46.7 kg (102 lb 14.4 oz)   Height:        Intake/Output Summary (Last 24 hours) at 09/04/16 1543 Last data filed at 09/03/16 1700  Gross per 24 hour  Intake             1010 ml  Output                0 ml  Net             1010 ml   Filed Weights   09/01/16 0127 09/04/16 0559  Weight: 48.1 kg (106 lb) 46.7 kg (102 lb 14.4 oz)    Examination: General: NAD Cardiovascular: RRR, S1/S2 +, no rubs, no gallops Respiratory: CTA bilaterally, no wheezing, no rhonchi Abdominal: Abdomen soft, mild distended, mild tenderness to palpation in the right upper quadrant. Bowel sounds distant Extremities: no edema,  Data Reviewed: I have personally reviewed following labs and imaging studies  CBC:  Recent Labs Lab 09/01/16 0153 09/02/16 0538 09/03/16 0538 09/04/16 0801  WBC 11.3* 7.6 12.1* 13.5*  NEUTROABS  --   --  9.6*  --   HGB 13.6 10.0* 10.5* 10.6*  HCT 41.0 29.2* 31.4* 31.7*  MCV 89.5 86.4 87.2 85.9  PLT 267 206 277 300   Basic Metabolic Panel:  Recent Labs Lab 09/01/16 0153 09/02/16 0538 09/03/16 0538  NA 138 138 138  K 4.5 3.1* 4.1  CL 106 105 106  CO2  24 23 25   GLUCOSE 135* 125* 107*  BUN 19 14 10   CREATININE 0.95 0.64 0.69  CALCIUM 9.5 7.5* 8.2*  MG  --   --  1.7   GFR: Estimated Creatinine Clearance: 49.6 mL/min (by C-G formula based on SCr of 0.69 mg/dL). Liver Function Tests:  Recent Labs Lab 09/01/16 0153  AST 25  ALT 18  ALKPHOS 41  BILITOT 0.5  PROT 6.9  ALBUMIN 4.0    Recent Labs Lab 09/01/16 0153  LIPASE 36   No results for input(s): AMMONIA in the last 168 hours. Coagulation Profile: No results for input(s): INR, PROTIME in the last 168 hours. Cardiac Enzymes: No results for input(s): CKTOTAL, CKMB, CKMBINDEX, TROPONINI in the last 168 hours. BNP  (last 3 results) No results for input(s): PROBNP in the last 8760 hours. HbA1C: No results for input(s): HGBA1C in the last 72 hours. CBG: No results for input(s): GLUCAP in the last 168 hours. Lipid Profile: No results for input(s): CHOL, HDL, LDLCALC, TRIG, CHOLHDL, LDLDIRECT in the last 72 hours. Thyroid Function Tests: No results for input(s): TSH, T4TOTAL, FREET4, T3FREE, THYROIDAB in the last 72 hours. Anemia Panel: No results for input(s): VITAMINB12, FOLATE, FERRITIN, TIBC, IRON, RETICCTPCT in the last 72 hours. Sepsis Labs: No results for input(s): PROCALCITON, LATICACIDVEN in the last 168 hours.  No results found for this or any previous visit (from the past 240 hour(s)).    Radiology Studies: Dg Abd Portable 1v  Result Date: 09/04/2016 CLINICAL DATA:  Nasogastric tube placement EXAM: PORTABLE ABDOMEN - 1 VIEW COMPARISON:  09/02/2016 FINDINGS: The nasogastric tube terminates in the distal esophagus and does not extend below the diaphragm. Marked gaseous distention of visible bowel in the abdomen. No extraluminal gas is evident. IMPRESSION: The nasogastric tube does not extend below the diaphragm. It terminates in the distal esophagus. These results will be called to the ordering clinician or representative by the Radiologist Assistant, and communication documented in the PACS or zVision Dashboard. Electronically Signed   By: Ellery Plunk M.D.   On: 09/04/2016 04:55      Scheduled Meds: . chlorhexidine  15 mL Mouth Rinse BID  . enoxaparin (LOVENOX) injection  40 mg Subcutaneous Q24H  . HYDROcodone-acetaminophen  2 tablet Oral Once  . LORazepam  1 mg Intravenous On Call  . mouth rinse  15 mL Mouth Rinse q12n4p  . methylPREDNISolone (SOLU-MEDROL) injection  40 mg Intravenous Daily  . traZODone  200 mg Oral QHS   Continuous Infusions: . 0.45 % NaCl with KCl 20 mEq / L 75 mL/hr at 09/03/16 1245     LOS: 3 days    Time spent: Total of 15 minutes spent with pt,  greater than 50% of which was spent in discussion of  treatment, counseling and coordination of care  Latrelle Dodrill, MD Pager: Text Page via [email or web address]  [phone]  If 7PM-7AM, please contact night-coverage [email or web address] Password [facility] 09/04/2016, 3:43 PM

## 2016-09-04 NOTE — Progress Notes (Signed)
Pt c/o nauseous feeling and vomitted approx 650 mls.  Complains of RUQ abdominal pain. At this time hypoactive BS, and pt is gassy and belching. Attending notified and NG tube placed. Pt did not tolerate became tearful. Xray reflected tube not properly placed, NG tube removed. Pain medication given for relief and another attempt later at pt's request

## 2016-09-05 ENCOUNTER — Inpatient Hospital Stay (HOSPITAL_COMMUNITY): Payer: PPO

## 2016-09-05 LAB — CBC WITH DIFFERENTIAL/PLATELET
Basophils Absolute: 0 10*3/uL (ref 0.0–0.1)
Basophils Relative: 0 %
EOS PCT: 0 %
Eosinophils Absolute: 0 10*3/uL (ref 0.0–0.7)
HEMATOCRIT: 30.6 % — AB (ref 36.0–46.0)
Hemoglobin: 10.4 g/dL — ABNORMAL LOW (ref 12.0–15.0)
LYMPHS ABS: 1.8 10*3/uL (ref 0.7–4.0)
LYMPHS PCT: 21 %
MCH: 29.5 pg (ref 26.0–34.0)
MCHC: 34 g/dL (ref 30.0–36.0)
MCV: 86.7 fL (ref 78.0–100.0)
MONO ABS: 0.8 10*3/uL (ref 0.1–1.0)
MONOS PCT: 10 %
Neutro Abs: 6 10*3/uL (ref 1.7–7.7)
Neutrophils Relative %: 70 %
Platelets: 266 10*3/uL (ref 150–400)
RBC: 3.53 MIL/uL — AB (ref 3.87–5.11)
RDW: 13.5 % (ref 11.5–15.5)
WBC: 8.6 10*3/uL (ref 4.0–10.5)

## 2016-09-05 LAB — BASIC METABOLIC PANEL
Anion gap: 7 (ref 5–15)
BUN: 10 mg/dL (ref 6–20)
CHLORIDE: 107 mmol/L (ref 101–111)
CO2: 21 mmol/L — AB (ref 22–32)
CREATININE: 0.72 mg/dL (ref 0.44–1.00)
Calcium: 7.4 mg/dL — ABNORMAL LOW (ref 8.9–10.3)
GFR calc Af Amer: >60 mL/min (ref >60)
GFR calc non Af Amer: >60 mL/min (ref >60)
GLUCOSE: 79 mg/dL (ref 65–99)
POTASSIUM: 4.1 mmol/L (ref 3.5–5.1)
Sodium: 135 mmol/L (ref 135–145)

## 2016-09-05 MED ORDER — METHYLPREDNISOLONE SODIUM SUCC 40 MG IJ SOLR
20.0000 mg | Freq: Every day | INTRAMUSCULAR | Status: DC
  Administered 2016-09-06 – 2016-09-07 (×2): 20 mg via INTRAVENOUS
  Filled 2016-09-05 (×2): qty 1

## 2016-09-05 NOTE — Progress Notes (Signed)
PROGRESS NOTE  Katrina Rivera FWY:[id number] DOB: [birth year] DOA: 09/01/2016 PCP: [name], MD   LOS: 4 days   Brief Narrative / Interim history: 71 yo female with history significant for MS s/p bowel resection in the 90's due to the MS, addison's disease, fibromyalgia, GERD, and IBS presented to [facility] ED with c/o X4 days worsening diarrhea with associated abdominal pain, n/v, dry heaving, anorexia, and HA. Patient reports problem started with diarrhea that she treated with imodium, making the pain worse and causing associated bloating, abdominal distention, n/v, anorexia, and then dry heaving & HA. She made an appointment to see her PCP but felt she couldn't wait so came into the ED. She reports decreased appetite X1 month with associated 5lb weight loss. ED imaging consistent with high grade obstruction. NGT placed in the ED and general surgery consulted. The patient was admitted for high grade SBO and dehydration.   Past abdominal surgical history significant for c-sections X2 (1973, 1977), subtotal colectomy (1990), cholecystectomy (1997), and hysterectomy / appendectomy (1998).   Assessment & Plan: Principal Problem:   SBO (small bowel obstruction) (HCC) Active Problems:   MS (multiple sclerosis) (HCC)   Dysautonomia orthostatic hypotension syndrome (HCC)   Addison's disease (HCC)  Small bowel obstruction -History of abdominal surgeries as above -CT scan consistent with high grade SBO  -NGT removed with unsuccessful attempt to advance diet over the weekend (7/27-7/29) -Did not tolerate reinsertion of NGT on 7/29.  -Surgery not recommended - as per 7/30 general surgery documentation -Advance diet again to clear liquids -Continue ambulating -Continue antiemetics and pain control. -Recheck labs in AM  Elevated Blood Pressure -Diagnosis of dysautonomia orthostatic hypotension syndrome -No diagnosis of hypertension -Likely 2/2 high dose steroids -BP  160/73>>157/75>>153/94>>169/80>>167/73 -Monitor  -Hydralazine PRN if SBP >170  Addison's disease -Managed by outpatient endocrinology -Continue stress dose steroids -Monitor  Leukocytosis, resolved -2/2 high dose steroids for addison's disease -No signs of infection  Dehydration, resolved -Monitor if v/d   Hypokalemia, resolved -Potassium at goal  Mild anemia -H/o transfusion 03/21/16 -Hgb 10.0>>10.5>>10.6>>10.4 -Monitor  Chronic low back pain, history of lumbar decompression surgery -Spinal cord stimulator placement 02/2016 -Toradol PRN  Multiple sclerosis -09/2013 MRI shows stable findings  -Not on immunomodulation therapy  DVT prophylaxis: Lovenox Code Status: Full Family Communication: Husband at bedside Disposition Plan: Unable to determine  Consultants:   General surgery  Procedures:   2D echo: None  Foley: None  BiPAP: None  HD: None   Antimicrobials:  None  Subjective: Patient not feeling well today. She expressed feelings of frustration that she has not yet had surgery, especially after she was not able to tolerate advancing her diet and and reinsertion of the NGT. She reports current HA and that she feels very nauseas but has not vomited today. She reports flatus and some stool but does not yet report a normal bowel movement. She also reports abdominal pain, rated 8/10 and worse on the right side. She states that at times she feels she may pass out but this happens infrequently. She has been able to ambulate successfully.  Objective: Vitals:   09/04/16 0559 09/04/16 1400 09/04/16 2032 09/05/16 0635  BP: (!) 169/80 (!) 158/78 (!) 171/84 (!) 167/73  Pulse: 79 81 85 77  Resp: 19 18 18    Temp: 98.7 F (37.1 C) 98.4 F (36.9 C) 98.7 F (37.1 C) 98.3 F (36.8 C)  TempSrc: Oral Oral Oral Oral  SpO2: 98% 100% 100% 100%  Weight: 46.7 kg (102 lb  14.4 oz)     Height:        Intake/Output Summary (Last 24 hours) at 09/05/16 1332 Last data filed  at 09/04/16 1700  Gross per 24 hour  Intake             1785 ml  Output                0 ml  Net             1785 ml   Filed Weights   09/01/16 0127 09/04/16 0559  Weight: 48.1 kg (106 lb) 46.7 kg (102 lb 14.4 oz)    Examination:  Vitals:   09/04/16 0559 09/04/16 1400 09/04/16 2032 09/05/16 0635  BP: (!) 169/80 (!) 158/78 (!) 171/84 (!) 167/73  Pulse: 79 81 85 77  Resp: 19 18 18    Temp: 98.7 F (37.1 C) 98.4 F (36.9 C) 98.7 F (37.1 C) 98.3 F (36.8 C)  TempSrc: Oral Oral Oral Oral  SpO2: 98% 100% 100% 100%  Weight: 46.7 kg (102 lb 14.4 oz)     Height:        Constitutional: NAD Eyes: PERRL, lids and conjunctivae normal Neck: normal, supple, no masses Respiratory: clear to auscultation bilaterally, no wheezing, no crackles. Normal respiratory effort. No accessory muscle use.  Cardiovascular: Regular rate and rhythm, no murmurs / rubs / gallops. No LE edema. 2+ pedal pulses. No carotid bruits.  Abdomen: diffusely TTP.  Musculoskeletal: no clubbing / cyanosis. No joint deformity upper and lower extremities. No contractures. Normal muscle tone.  Skin: no rashes, lesions, ulcers. No induration Neurologic: Strength 5/5 in all 4.  Psychiatric: Normal judgment and insight. Alert and oriented x 3. Normal mood.    Data Reviewed: I have independently reviewed following labs and imaging studies  CBC:  Recent Labs Lab 09/01/16 0153 09/02/16 0538 09/03/16 0538 09/04/16 0801 09/05/16 0340  WBC 11.3* 7.6 12.1* 13.5* 8.6  NEUTROABS  --   --  9.6*  --  6.0  HGB 13.6 10.0* 10.5* 10.6* 10.4*  HCT 41.0 29.2* 31.4* 31.7* 30.6*  MCV 89.5 86.4 87.2 85.9 86.7  PLT 267 206 277 300 266   Basic Metabolic Panel:  Recent Labs Lab 09/01/16 0153 09/02/16 0538 09/03/16 0538 09/05/16 0340  NA 138 138 138 135  K 4.5 3.1* 4.1 4.1  CL 106 105 106 107  CO2 24 23 25  21*  GLUCOSE 135* 125* 107* 79  BUN 19 14 10 10   CREATININE 0.95 0.64 0.69 0.72  CALCIUM 9.5 7.5* 8.2* 7.4*  MG   --   --  1.7  --    GFR: Estimated Creatinine Clearance: 49.6 mL/min (by C-G formula based on SCr of 0.72 mg/dL). Liver Function Tests:  Recent Labs Lab 09/01/16 0153  AST 25  ALT 18  ALKPHOS 41  BILITOT 0.5  PROT 6.9  ALBUMIN 4.0    Recent Labs Lab 09/01/16 0153  LIPASE 36   No results for input(s): AMMONIA in the last 168 hours. Coagulation Profile: No results for input(s): INR, PROTIME in the last 168 hours. Cardiac Enzymes: No results for input(s): CKTOTAL, CKMB, CKMBINDEX, TROPONINI in the last 168 hours. BNP (last 3 results) No results for input(s): PROBNP in the last 8760 hours. HbA1C: No results for input(s): HGBA1C in the last 72 hours. CBG: No results for input(s): GLUCAP in the last 168 hours. Lipid Profile: No results for input(s): CHOL, HDL, LDLCALC, TRIG, CHOLHDL, LDLDIRECT in the last 72 hours. Thyroid Function  Tests: No results for input(s): TSH, T4TOTAL, FREET4, T3FREE, THYROIDAB in the last 72 hours. Anemia Panel: No results for input(s): VITAMINB12, FOLATE, FERRITIN, TIBC, IRON, RETICCTPCT in the last 72 hours. Urine analysis:    Component Value Date/Time   COLORURINE YELLOW 09/01/2016 0605   APPEARANCEUR CLOUDY (A) 09/01/2016 0605   LABSPEC 1.020 09/01/2016 0605   PHURINE 6.0 09/01/2016 0605   GLUCOSEU NEGATIVE 09/01/2016 0605   GLUCOSEU NEGATIVE 01/21/2016 1628   HGBUR NEGATIVE 09/01/2016 0605   BILIRUBINUR NEGATIVE 09/01/2016 0605   BILIRUBINUR negative 01/21/2016 1212   KETONESUR 5 (A) 09/01/2016 0605   PROTEINUR 30 (A) 09/01/2016 0605   UROBILINOGEN 0.2 01/21/2016 1628   NITRITE NEGATIVE 09/01/2016 0605   LEUKOCYTESUR MODERATE (A) 09/01/2016 0605   Sepsis Labs: Invalid input(s): PROCALCITONIN, LACTICIDVEN  No results found for this or any previous visit (from the past 240 hour(s)).    Radiology Studies: Dg Abd 1 View  Result Date: 09/05/2016 CLINICAL DATA:  Lower abdominal pain. EXAM: ABDOMEN - 1 VIEW COMPARISON:  Radiograph of  September 04, 2016. FINDINGS: Postsurgical changes are seen involving the lower lumbar spine. Surgical clip is noted in the pelvis. No significant bowel dilatation is noted. No definite renal calculi are noted. IMPRESSION: No definite evidence of bowel obstruction or ileus. Electronically Signed   By: Lupita Raider, M.D.   On: 09/05/2016 08:21   Dg Abd Portable 1v  Result Date: 09/04/2016 CLINICAL DATA:  Nasogastric tube placement EXAM: PORTABLE ABDOMEN - 1 VIEW COMPARISON:  09/02/2016 FINDINGS: The nasogastric tube terminates in the distal esophagus and does not extend below the diaphragm. Marked gaseous distention of visible bowel in the abdomen. No extraluminal gas is evident. IMPRESSION: The nasogastric tube does not extend below the diaphragm. It terminates in the distal esophagus. These results will be called to the ordering clinician or representative by the Radiologist Assistant, and communication documented in the PACS or zVision Dashboard. Electronically Signed   By: Ellery Plunk M.D.   On: 09/04/2016 04:55     Scheduled Meds: . chlorhexidine  15 mL Mouth Rinse BID  . enoxaparin (LOVENOX) injection  40 mg Subcutaneous Q24H  . HYDROcodone-acetaminophen  2 tablet Oral Once  . mouth rinse  15 mL Mouth Rinse q12n4p  . methylPREDNISolone (SOLU-MEDROL) injection  40 mg Intravenous Daily  . traZODone  200 mg Oral QHS   Continuous Infusions: . 0.45 % NaCl with KCl 20 mEq / L 75 mL/hr at 09/05/16 1610      Marisue Ivan, Student-PA  09/05/2016 1:32 PM

## 2016-09-05 NOTE — Progress Notes (Signed)
Patient ID: Katrina Rivera, female   DOB: [birth year], 71 y.o.   MRN: [id number]  [facility] Progress Note     Subjective: CC- SBO Sitting up in the bed, has been ambulating earlier this morning. XR this morning showed no definite SBO/ileus, less small bowel dilation. Passing small amount of flatus. Reports some persistent right abdominal pain.  Objective: Vital signs in last 24 hours: Temp:  [98.3 F (36.8 C)-98.7 F (37.1 C)] 98.3 F (36.8 C) (07/30 0635) Pulse Rate:  [77-85] 77 (07/30 0635) Resp:  [18] 18 (07/29 2032) BP: (158-171)/(73-84) 167/73 (07/30 0635) SpO2:  [100 %] 100 % (07/30 0635) Last BM Date: 09/04/16  Intake/Output from previous day: 07/29 0701 - 07/30 0700 In: 1785 [I.V.:1785] Out: -  Intake/Output this shift: No intake/output data recorded.  PE: Gen:  Alert, NAD, pleasant HEENT: EOM's intact, pupils equal and round Card:  RRR, no M/G/R heard Pulm:  CTAB, no W/R/R, effort normal Abd: well healed midline/lower transverse/mulitple lap incisions, soft, mild distension, +BS in all 4 quadrants, no masses, hernias, or organomegaly. Mild right sided abdominal tenderness Ext:  No erythema, edema, or tenderness BUE/BLE  Psych: A&Ox3  Skin: no rashes noted, warm and dry  Lab Results:   Recent Labs  09/04/16 0801 09/05/16 0340  WBC 13.5* 8.6  HGB 10.6* 10.4*  HCT 31.7* 30.6*  PLT 300 266   BMET  Recent Labs  09/03/16 0538 09/05/16 0340  NA 138 135  K 4.1 4.1  CL 106 107  CO2 25 21*  GLUCOSE 107* 79  BUN 10 10  CREATININE 0.69 0.72  CALCIUM 8.2* 7.4*   PT/INR No results for input(s): LABPROT, INR in the last 72 hours. CMP     Component Value Date/Time   NA 135 09/05/2016 0340   K 4.1 09/05/2016 0340   CL 107 09/05/2016 0340   CO2 21 (L) 09/05/2016 0340   GLUCOSE 79 09/05/2016 0340   BUN 10 09/05/2016 0340   CREATININE 0.72 09/05/2016 0340   CREATININE 0.85 11/06/2013 0840   CALCIUM 7.4 (L) 09/05/2016 0340   PROT 6.9  09/01/2016 0153   ALBUMIN 4.0 09/01/2016 0153   AST 25 09/01/2016 0153   ALT 18 09/01/2016 0153   ALKPHOS 41 09/01/2016 0153   BILITOT 0.5 09/01/2016 0153   GFRNONAA >60 09/05/2016 0340   GFRAA >60 09/05/2016 0340   Lipase     Component Value Date/Time   LIPASE 36 09/01/2016 0153       Studies/Results: Dg Abd 1 View  Result Date: 09/05/2016 CLINICAL DATA:  Lower abdominal pain. EXAM: ABDOMEN - 1 VIEW COMPARISON:  Radiograph of September 04, 2016. FINDINGS: Postsurgical changes are seen involving the lower lumbar spine. Surgical clip is noted in the pelvis. No significant bowel dilatation is noted. No definite renal calculi are noted. IMPRESSION: No definite evidence of bowel obstruction or ileus. Electronically Signed   By: Lupita Raider, M.D.   On: 09/05/2016 08:21   Dg Abd Portable 1v  Result Date: 09/04/2016 CLINICAL DATA:  Nasogastric tube placement EXAM: PORTABLE ABDOMEN - 1 VIEW COMPARISON:  09/02/2016 FINDINGS: The nasogastric tube terminates in the distal esophagus and does not extend below the diaphragm. Marked gaseous distention of visible bowel in the abdomen. No extraluminal gas is evident. IMPRESSION: The nasogastric tube does not extend below the diaphragm. It terminates in the distal esophagus. These results will be called to the ordering clinician or representative by the Radiologist Assistant, and communication documented in the  PACS or zVision Dashboard. Electronically Signed   By: Ellery Plunk M.D.   On: 09/04/2016 04:55    Anti-infectives: Anti-infectives    None       Assessment/Plan Multiple sclerosis Addison's disease GERD Chronic low back pain  SBO - abdominal surgical history: subtotal colectomy 1990 by Dr. Lauralee Evener; cholecystectomy 1997; c sections x2 in 1973 and 1977; hysterectomy/appendectomy 1988 - CT scan shows high-grade SBO, markedly dilated fluid and air-filled stomach and proximal small bowel with collapsed distal small bowel; no free  air; marked intra and extra hepatic biliary ductal dilatation with dilated pancreatic ductal system (?ampullary stricture, occult ampullary mass, incompetent sphincter?) - WBC 10.6, TMAX 99 - XR today shows no definite bowel obstruction or ileus  ID - none FEN - IVF, clears VTE - SCDs, lovenox  Plan - advance to clear liquids. Continue ambulating. Recheck labs in AM.   LOS: 4 days    Franne Forts , [facility] 09/05/2016, 11:04 AM Pager: [phone] Consults: [phone] Mon-Fri 7:00 am-4:30 pm Sat-Sun 7:00 am-11:30 am

## 2016-09-05 NOTE — Care Management Important Message (Signed)
Important Message  Patient Details  Name: Katrina Rivera MRN: [id number] Date of Birth: Apr 19, 1945   Medicare Important Message Given:  Yes    [name], [name] 09/05/2016, 10:45 AMImportant Message  Patient Details  Name: Katrina Rivera MRN: [id number] Date of Birth: 09/24/1945   Medicare Important Message Given:  Yes    [name], [name] 09/05/2016, 10:44 AM

## 2016-09-05 NOTE — Progress Notes (Signed)
Date:  September 05 2016  Chart reviewed for concurrent status and case management needs.  Will continue to follow patient progress. Chrissie Noa and ng tube had some vomiting pm of [id number] Discharge Planning: following for needs  Expected discharge date: [id number]  Marcelle Smiling, BSN, [place], [place]   [phone]

## 2016-09-06 DIAGNOSIS — I10 Essential (primary) hypertension: Secondary | ICD-10-CM

## 2016-09-06 LAB — BASIC METABOLIC PANEL
Anion gap: 8 (ref 5–15)
BUN: 12 mg/dL (ref 6–20)
CHLORIDE: 104 mmol/L (ref 101–111)
CO2: 21 mmol/L — AB (ref 22–32)
CREATININE: 0.67 mg/dL (ref 0.44–1.00)
Calcium: 7.4 mg/dL — ABNORMAL LOW (ref 8.9–10.3)
GFR calc Af Amer: >60 mL/min (ref >60)
GFR calc non Af Amer: >60 mL/min (ref >60)
Glucose, Bld: 88 mg/dL (ref 65–99)
Potassium: 3.6 mmol/L (ref 3.5–5.1)
Sodium: 133 mmol/L — ABNORMAL LOW (ref 135–145)

## 2016-09-06 LAB — CBC
HEMATOCRIT: 31.2 % — AB (ref 36.0–46.0)
HEMOGLOBIN: 10.6 g/dL — AB (ref 12.0–15.0)
MCH: 29.2 pg (ref 26.0–34.0)
MCHC: 34 g/dL (ref 30.0–36.0)
MCV: 86 fL (ref 78.0–100.0)
Platelets: 277 10*3/uL (ref 150–400)
RBC: 3.63 MIL/uL — ABNORMAL LOW (ref 3.87–5.11)
RDW: 13.5 % (ref 11.5–15.5)
WBC: 9.5 10*3/uL (ref 4.0–10.5)

## 2016-09-06 LAB — ALBUMIN: Albumin: 3.7 g/dL (ref 3.5–5.0)

## 2016-09-06 MED ORDER — ALUM & MAG HYDROXIDE-SIMETH 200-200-20 MG/5ML PO SUSP
30.0000 mL | ORAL | Status: DC | PRN
  Administered 2016-09-06: 30 mL via ORAL
  Filled 2016-09-06: qty 30

## 2016-09-06 MED ORDER — BOOST / RESOURCE BREEZE PO LIQD
1.0000 | ORAL | Status: DC
  Administered 2016-09-07: 1 via ORAL

## 2016-09-06 MED ORDER — SODIUM CHLORIDE 0.9 % IV SOLN
1.0000 g | Freq: Once | INTRAVENOUS | Status: AC
  Administered 2016-09-06: 1 g via INTRAVENOUS
  Filled 2016-09-06: qty 10

## 2016-09-06 MED ORDER — AMLODIPINE BESYLATE 5 MG PO TABS
5.0000 mg | ORAL_TABLET | Freq: Every day | ORAL | Status: DC
  Administered 2016-09-06 – 2016-09-07 (×2): 5 mg via ORAL
  Filled 2016-09-06 (×2): qty 1

## 2016-09-06 NOTE — Progress Notes (Signed)
PROGRESS NOTE  Katrina Rivera QIO:[id number] DOB: September 30, 1945 DOA: 09/01/2016 PCP: [name], MD   LOS: 5 days   Brief Narrative / Interim history: 71 yo female with history significant for MS s/p bowel resection in the 90's due to the MS, addison's disease, fibromyalgia, GERD, and IBS presented to [facility] ED with c/o X4 days worsening diarrhea with associated abdominal pain, n/v, dry heaving, anorexia, and HA. Past abdominal surgical history significant for c-sections X2 (1973, 1977), subtotal colectomy (1990), cholecystectomy (1997), and hysterectomy / appendectomy (1998). ED imaging was found to be consistent with high grade obstruction. NGT was placed in the ED and patient was put on bowel rest and admitted for SBO and dehydration. Surgery was consulted and recommended conservative treatment with bowel rest protocol.   On 7/28, NGT was removed and diet advanced 7/27-7/29 without success. NGT reinsertion was not tolerated by patient. Surgery was consulted again and did not recommend surgery. Patient was placed back on clear liquid diet 7/30 and tolerating well. Calcium gluconate 7/31 for hypocalcemia.  Assessment & Plan: Principal Problem:   SBO (small bowel obstruction) (HCC) Active Problems:   MS (multiple sclerosis) (HCC)   Dysautonomia orthostatic hypotension syndrome (HCC)   Addison's disease (HCC)  Small bowel obstruction -History of abdominal surgeries as above -CT scan consistent with high grade SBO  -NGT removed. Gen surgery recommends conservative treatment -Continue antiemetics and pain control. -Advance diet as tolerated. -Continue ambulating -Recheck labs in AM  Elevated Blood Pressure -Remains elevated with most recent 142/56 -Continue tapering steroids  -Monitor -Hydralazine PRN if SBP >170  Hypocalcemia -Calcium gluconate for repletion  Addison's disease -Managed by outpatient endocrinology -Continue tapering stress dose steroids -Monitor  Leukocytosis,  resolved -2/2 steroids for addison's disease -No signs of infection -Cont. Tapering steroids  Dehydration, resolved -Monitor if v/d   Hypokalemia, resolved -Slight decrease since yesterday 4.1>3.6 -Monitor and replete with goal of 4.0 if needed  Mild anemia -H/o transfusion 03/21/16 -Hgb stable at 10.6>>10.4>>10.6 -Monitor  Chronic low back pain, history of lumbar decompression surgery -Spinal cord stimulator placement 02/2016 -Toradol PRN  Multiple sclerosis -09/2013 MRI shows stable findings  -Not on immunomodulation therapy  DVT prophylaxis: Lovenox Code Status: Full Family Communication: Husband at bedside Disposition Plan: Unable to determine  Consultants:   General surgery  Procedures:   2D echo: None  Foley: None  BiPAP: None  HD: None   Antimicrobials:  None  Subjective: Patient reports feeling much better today. She states that she slept well last night and no longer has a headache, which she feels was due to stress. She does report flatus and had diarrhea today that is of normal color. She denies current nausea and vomiting in the last 24 hours. She reports improving abdominal pain and feels that she has been better able to tolerate the diet advancement (this time) d/t  "taking it slowly and only taking a few sips at a time." She reports a healthy appetite & expresses a desire to further advance her diet to soft foods.   Objective: Vitals:   09/05/16 0635 09/05/16 1450 09/05/16 2109 09/06/16 0630  BP: (!) 167/73 (!) 149/86 (!) 169/75 (!) 142/56  Pulse: 77 91 100 85  Resp:  20 20 20   Temp: 98.3 F (36.8 C) 98.3 F (36.8 C) 97.9 F (36.6 C) 98.7 F (37.1 C)  TempSrc: Oral Oral Oral Oral  SpO2: 100% 100% 100% 100%  Weight:  46.7 kg (103 lb)    Height:  5\' 5"  (1.651  m)      Intake/Output Summary (Last 24 hours) at 09/06/16 1407 Last data filed at 09/06/16 0742  Gross per 24 hour  Intake              120 ml  Output                0 ml  Net               120 ml   Filed Weights   09/01/16 0127 09/04/16 0559 09/05/16 1450  Weight: 48.1 kg (106 lb) 46.7 kg (102 lb 14.4 oz) 46.7 kg (103 lb)    Examination:  Vitals:   09/05/16 0635 09/05/16 1450 09/05/16 2109 09/06/16 0630  BP: (!) 167/73 (!) 149/86 (!) 169/75 (!) 142/56  Pulse: 77 91 100 85  Resp:  20 20 20   Temp: 98.3 F (36.8 C) 98.3 F (36.8 C) 97.9 F (36.6 C) 98.7 F (37.1 C)  TempSrc: Oral Oral Oral Oral  SpO2: 100% 100% 100% 100%  Weight:  46.7 kg (103 lb)    Height:  5\' 5"  (1.651 m)      Constitutional: NAD Eyes: PERRL, lids and conjunctivae normal Neck: normal, supple, no masses Respiratory: clear to auscultation bilaterally, no wheezing, no crackles. Normal respiratory effort. No accessory muscle use.  Cardiovascular: Regular rate and rhythm, no murmurs / rubs / gallops. No LE edema. 2+ pedal pulses. No carotid bruits.  Abdomen: Soft, non-tender, non-distended. Musculoskeletal: no clubbing / cyanosis. No joint deformity upper and lower extremities. No contractures. Normal muscle tone.  Skin: no rashes, lesions, ulcers. No induration Psychiatric: Normal judgment and insight. Alert and oriented x 3. Normal mood.    Data Reviewed: I have independently reviewed following labs and imaging studies  CBC:  Recent Labs Lab 09/02/16 0538 09/03/16 0538 09/04/16 0801 09/05/16 0340 09/06/16 0357  WBC 7.6 12.1* 13.5* 8.6 9.5  NEUTROABS  --  9.6*  --  6.0  --   HGB 10.0* 10.5* 10.6* 10.4* 10.6*  HCT 29.2* 31.4* 31.7* 30.6* 31.2*  MCV 86.4 87.2 85.9 86.7 86.0  PLT 206 277 300 266 277   Basic Metabolic Panel:  Recent Labs Lab 09/01/16 0153 09/02/16 0538 09/03/16 0538 09/05/16 0340 09/06/16 0357  NA 138 138 138 135 133*  K 4.5 3.1* 4.1 4.1 3.6  CL 106 105 106 107 104  CO2 24 23 25  21* 21*  GLUCOSE 135* 125* 107* 79 88  BUN 19 14 10 10 12   CREATININE 0.95 0.64 0.69 0.72 0.67  CALCIUM 9.5 7.5* 8.2* 7.4* 7.4*  MG  --   --  1.7  --   --     GFR: Estimated Creatinine Clearance: 49.6 mL/min (by C-G formula based on SCr of 0.67 mg/dL). Liver Function Tests:  Recent Labs Lab 09/01/16 0153  AST 25  ALT 18  ALKPHOS 41  BILITOT 0.5  PROT 6.9  ALBUMIN 4.0    Recent Labs Lab 09/01/16 0153  LIPASE 36   No results for input(s): AMMONIA in the last 168 hours. Coagulation Profile: No results for input(s): INR, PROTIME in the last 168 hours. Cardiac Enzymes: No results for input(s): CKTOTAL, CKMB, CKMBINDEX, TROPONINI in the last 168 hours. BNP (last 3 results) No results for input(s): PROBNP in the last 8760 hours. HbA1C: No results for input(s): HGBA1C in the last 72 hours. CBG: No results for input(s): GLUCAP in the last 168 hours. Lipid Profile: No results for input(s): CHOL, HDL, LDLCALC, TRIG, CHOLHDL, LDLDIRECT  in the last 72 hours. Thyroid Function Tests: No results for input(s): TSH, T4TOTAL, FREET4, T3FREE, THYROIDAB in the last 72 hours. Anemia Panel: No results for input(s): VITAMINB12, FOLATE, FERRITIN, TIBC, IRON, RETICCTPCT in the last 72 hours. Urine analysis:    Component Value Date/Time   COLORURINE YELLOW 09/01/2016 0605   APPEARANCEUR CLOUDY (A) 09/01/2016 0605   LABSPEC 1.020 09/01/2016 0605   PHURINE 6.0 09/01/2016 0605   GLUCOSEU NEGATIVE 09/01/2016 0605   GLUCOSEU NEGATIVE 01/21/2016 1628   HGBUR NEGATIVE 09/01/2016 0605   BILIRUBINUR NEGATIVE 09/01/2016 0605   BILIRUBINUR negative 01/21/2016 1212   KETONESUR 5 (A) 09/01/2016 0605   PROTEINUR 30 (A) 09/01/2016 0605   UROBILINOGEN 0.2 01/21/2016 1628   NITRITE NEGATIVE 09/01/2016 0605   LEUKOCYTESUR MODERATE (A) 09/01/2016 0605   Sepsis Labs: Invalid input(s): PROCALCITONIN, LACTICIDVEN  No results found for this or any previous visit (from the past 240 hour(s)).    Radiology Studies: Dg Abd 1 View  Result Date: 09/05/2016 CLINICAL DATA:  Lower abdominal pain. EXAM: ABDOMEN - 1 VIEW COMPARISON:  Radiograph of September 04, 2016.  FINDINGS: Postsurgical changes are seen involving the lower lumbar spine. Surgical clip is noted in the pelvis. No significant bowel dilatation is noted. No definite renal calculi are noted. IMPRESSION: No definite evidence of bowel obstruction or ileus. Electronically Signed   By: Lupita Raider, M.D.   On: 09/05/2016 08:21     Scheduled Meds: . chlorhexidine  15 mL Mouth Rinse BID  . enoxaparin (LOVENOX) injection  40 mg Subcutaneous Q24H  . feeding supplement  1 Container Oral Q24H  . HYDROcodone-acetaminophen  2 tablet Oral Once  . mouth rinse  15 mL Mouth Rinse q12n4p  . methylPREDNISolone (SOLU-MEDROL) injection  20 mg Intravenous Daily  . traZODone  200 mg Oral QHS   Continuous Infusions: . 0.45 % NaCl with KCl 20 mEq / L 75 mL/hr at 09/06/16 1231      Marisue Ivan, Student-PA  09/06/2016 2:07 PM

## 2016-09-06 NOTE — Progress Notes (Signed)
Patient ambulated in the hallway with steady gait.

## 2016-09-06 NOTE — Progress Notes (Signed)
Initial Nutrition Assessment  DOCUMENTATION CODES:   Severe malnutrition in context of acute illness/injury, Underweight  INTERVENTION:   -Provide Boost Breeze daily, each supplement provides 250 kcal and 9 grams of protein -Encourage PO intake -Reviewed soft diet options for when diet is advanced  -RD will continue to monitor for needs  NUTRITION DIAGNOSIS:   Malnutrition (severe) related to acute illness (SBO) as evidenced by energy intake < or equal to 50% for > or equal to 5 days, moderate depletion of body fat, moderate depletions of muscle mass.  GOAL:   Patient will meet greater than or equal to 90% of their needs  MONITOR:   PO intake, Labs, Weight trends, I & O's, Supplement acceptance  REASON FOR ASSESSMENT:    (Low BMI: 17.2)    ASSESSMENT:   71 year old female with past medical history MS status post bowel resection, as this is, fibromyalgia, gutters and diabetes who presented to the emergency department complaining of abdominal pain and nausea and vomiting. Upon ED evaluation she was found to have small bowel obstruction and was admitted for bowel rest and NG tube placements. Surgery was consulted and recommended conservative management.  Patient in room with husband at bedside. Pt states she was eating poorly for 10 days PTA. Her appetite was poor for almost a month PTA. Pt's diet was advanced 7/27-7/28 but pt states she thinks she ate too much at that time and was unable to tolerate the full liquids. Pt is now on a full liquid diet and is trying to slowly sip throughout the day. Pt with many questions regarding a soft diet. Provided brief die education for patient and pt's husband.  Pt states she has drank Boost drinks in the past for breakfast. She was unsure if she could tolerate them now but was willing to try Boost Breeze as they are clear liquid.  Per chart review, pt has lost 8 lbs since 4/6 (7% wt loss x 4 months, insignificant for time frame).  Nutrition-Focused physical exam completed. Findings are moderate fat depletion, moderate muscle depletion, and no edema.   Medications reviewed. Labs reviewed: Low Na Mg WNL  Diet Order:  Diet full liquid Room service appropriate? Yes; Fluid consistency: Thin  Skin:  Reviewed, no issues  Last BM:  7/30  Height:   Ht Readings from Last 1 Encounters:  09/05/16 5\' 5"  (1.651 m)    Weight:   Wt Readings from Last 1 Encounters:  09/05/16 103 lb (46.7 kg)    Ideal Body Weight:  56.8 kg  BMI:  Body mass index is 17.14 kg/m.  Estimated Nutritional Needs:   Kcal:  1300-1500  Protein:  60-70g  Fluid:  1.5L/day  EDUCATION NEEDS:   Education needs addressed  Tilda Franco, MS, RD, LDN Pager: [phone] After Hours Pager: [phone]

## 2016-09-06 NOTE — Progress Notes (Signed)
    CC:SBO  Subjective: Feeling much better.  Tolerating clears and had a BM x 2 yesterday.  Says she feels better, close to baseline.  This is her first SBO.  BM were fairly normal for her.   Objective: Vital signs in last 24 hours: Temp:  [97.9 F (36.6 C)-98.7 F (37.1 C)] 98.7 F (37.1 C) (07/31 0630) Pulse Rate:  [85-100] 85 (07/31 0630) Resp:  [20] 20 (07/31 0630) BP: (142-169)/(56-86) 142/56 (07/31 0630) SpO2:  [100 %] 100 % (07/31 0630) Weight:  [46.7 kg (103 lb)] 46.7 kg (103 lb) (07/30 1450) Last BM Date: 09/05/16 PO/IV not recorded BM x 2 recorded Urine x 2 recorded Afebrile, VSS Labs OK Film 7/30:  No SBO  Intake/Output from previous day: No intake/output data recorded. Intake/Output this shift: No intake/output data recorded.  General appearance: alert, cooperative and no distress Resp: clear to auscultation bilaterally GI: soft, non-tender; bowel sounds normal; no masses,  no organomegaly  Lab Results:   Recent Labs  09/05/16 0340 09/06/16 0357  WBC 8.6 9.5  HGB 10.4* 10.6*  HCT 30.6* 31.2*  PLT 266 277    BMET  Recent Labs  09/05/16 0340 09/06/16 0357  NA 135 133*  K 4.1 3.6  CL 107 104  CO2 21* 21*  GLUCOSE 79 88  BUN 10 12  CREATININE 0.72 0.67  CALCIUM 7.4* 7.4*   PT/INR No results for input(s): LABPROT, INR in the last 72 hours.   Recent Labs Lab 09/01/16 0153  AST 25  ALT 18  ALKPHOS 41  BILITOT 0.5  PROT 6.9  ALBUMIN 4.0     Lipase     Component Value Date/Time   LIPASE 36 09/01/2016 0153     Medications: . chlorhexidine  15 mL Mouth Rinse BID  . enoxaparin (LOVENOX) injection  40 mg Subcutaneous Q24H  . HYDROcodone-acetaminophen  2 tablet Oral Once  . mouth rinse  15 mL Mouth Rinse q12n4p  . methylPREDNISolone (SOLU-MEDROL) injection  20 mg Intravenous Daily  . traZODone  200 mg Oral QHS    Assessment/Plan SBO with Multiple surgeries in the past Multiple sclerosis Addison's disease GERD Chronic low  back pain ID - none FEN - IVF, clears VTE - SCDs, lovenox   Plan:  Full liquids now and advance to soft, home soon.  She is up walking and MS is in remission.     LOS: 5 days    Katrina Rivera 09/06/2016 [phone]

## 2016-09-07 DIAGNOSIS — K56609 Unspecified intestinal obstruction, unspecified as to partial versus complete obstruction: Secondary | ICD-10-CM

## 2016-09-07 LAB — COMPREHENSIVE METABOLIC PANEL
ALBUMIN: 3.4 g/dL — AB (ref 3.5–5.0)
ALK PHOS: 39 U/L (ref 38–126)
ALT: 35 U/L (ref 14–54)
AST: 21 U/L (ref 15–41)
Anion gap: 8 (ref 5–15)
BILIRUBIN TOTAL: 0.4 mg/dL (ref 0.3–1.2)
BUN: 9 mg/dL (ref 6–20)
CALCIUM: 8.5 mg/dL — AB (ref 8.9–10.3)
CO2: 24 mmol/L (ref 22–32)
CREATININE: 0.65 mg/dL (ref 0.44–1.00)
Chloride: 102 mmol/L (ref 101–111)
GFR calc Af Amer: >60 mL/min (ref >60)
GLUCOSE: 89 mg/dL (ref 65–99)
Potassium: 3.9 mmol/L (ref 3.5–5.1)
Sodium: 134 mmol/L — ABNORMAL LOW (ref 135–145)
TOTAL PROTEIN: 5.9 g/dL — AB (ref 6.5–8.1)

## 2016-09-07 MED ORDER — ENOXAPARIN SODIUM 30 MG/0.3ML ~~LOC~~ SOLN
30.0000 mg | SUBCUTANEOUS | Status: DC
  Administered 2016-09-07: 30 mg via SUBCUTANEOUS
  Filled 2016-09-07: qty 0.3

## 2016-09-07 MED ORDER — AMLODIPINE BESYLATE 5 MG PO TABS: 5.0000 mg | ORAL_TABLET | Freq: Every day | ORAL | 0 refills | Status: DC

## 2016-09-07 NOTE — Progress Notes (Signed)
    CC:  SBP  Subjective: Pt says she didn't have a BM yesterday, but lots of gas.  No issue this Am with eggs for breakfast. + BS, no abdominal pain.    Objective: Vital signs in last 24 hours: Temp:  [98.1 F (36.7 C)-98.5 F (36.9 C)] 98.1 F (36.7 C) (08/01 0606) Pulse Rate:  [84-100] 88 (08/01 0606) Resp:  [18-20] 20 (08/01 0606) BP: (148-170)/(64-80) 148/64 (08/01 0606) SpO2:  [100 %] 100 % (08/01 0606) Weight:  [46.9 kg (103 lb 6.4 oz)] 46.9 kg (103 lb 6.4 oz) (08/01 0719) Last BM Date: 09/05/16 360 PO Voided x 6  BM x 2 recorded Afebrile, VSS Labs OK CA up to 8.5 - corrected 8.9   Intake/Output from previous day: 07/31 0701 - 08/01 0700 In: 1110 [P.O.:360; I.V.:750] Out: -  Intake/Output this shift: Total I/O In: -  Out: 300 [Urine:300]  General appearance: alert, cooperative and no distress GI: soft, non-tender; bowel sounds normal; no masses,  no organomegaly  Lab Results:   Recent Labs  09/05/16 0340 09/06/16 0357  WBC 8.6 9.5  HGB 10.4* 10.6*  HCT 30.6* 31.2*  PLT 266 277    BMET  Recent Labs  09/06/16 0357 09/07/16 0333  NA 133* 134*  K 3.6 3.9  CL 104 102  CO2 21* 24  GLUCOSE 88 89  BUN 12 9  CREATININE 0.67 0.65  CALCIUM 7.4* 8.5*   PT/INR No results for input(s): LABPROT, INR in the last 72 hours.   Recent Labs Lab 09/01/16 0153 09/06/16 1627 09/07/16 0333  AST 25  --  21  ALT 18  --  35  ALKPHOS 41  --  39  BILITOT 0.5  --  0.4  PROT 6.9  --  5.9*  ALBUMIN 4.0 3.7 3.4*     Lipase     Component Value Date/Time   LIPASE 36 09/01/2016 0153     Medications: . amLODipine  5 mg Oral Daily  . chlorhexidine  15 mL Mouth Rinse BID  . enoxaparin (LOVENOX) injection  40 mg Subcutaneous Q24H  . feeding supplement  1 Container Oral Q24H  . HYDROcodone-acetaminophen  2 tablet Oral Once  . mouth rinse  15 mL Mouth Rinse q12n4p  . methylPREDNISolone (SOLU-MEDROL) injection  20 mg Intravenous Daily  . traZODone  200 mg  Oral QHS    Assessment/Plan SBO with Multiple surgeries in the past Multiple sclerosis Hypocalcemia - replaced Addison's disease GERD Chronic low back pain ID - none FEN - IVF, soft diet last PM  VTE - SCDs, lovenox    Plan  Go slow with Diet, full liquids to soft for the next several days. Continue to ambulate frequently.  We will see again as needed.     LOS: 6 days    Taiquan Campanaro 09/07/2016 [phone]

## 2016-09-07 NOTE — Discharge Summary (Signed)
Physician Discharge Summary  Katrina Rivera HKV:[id number] DOB: 1945/08/17 DOA: 09/01/2016  PCP: [name], MD  Admit date: 09/01/2016 Discharge date: 09/07/2016  Admitted From: Home Disposition:  Home  Recommendations for Outpatient Follow-up:  1. Follow up with PCP in 1 week 2. Follow up with Dr. Lucianne Muss, endocrinology, in 1 week  3. Patient wished to stop cyproheptadine/periactin, which was started for decreased appetite by PCP, as it has not been helping.   4. Started on norvasc due to elevated blood pressure despite stress-dose steroid tapering. Encouraged to check blood pressure daily and keep close record, follow up with PCP and Dr. Lucianne Muss   Home Health: No  Equipment/Devices: None   Discharge Condition: Stable CODE STATUS: Full  Diet recommendation: Soft diet, advance slowly as tolerated   Brief/Interim Summary: From H&P by Dr. Onalee Hua: Katrina Rivera is a 71 y.o. female with medical history significant of Multiple sclerosis, s/p partial bowel resection in the 1990s due to MS, addisons disease, CBP, fibromyalgia comes in with 4 days of progressive worsening abdominal pain generalized, nausea and diarrhea.  It started off with some diarrhea then progressed to pain, nausea and feeling bloated.  No fevers.  No bleeding.  Pt has no prior h/o sbo.  Found to have sbo high grade on ct, general surgery team consulted and involved.  ngt about to be placed.  Pt referred for admission for high grade SBO.  Interim: Patient was treated conservatively, diet slowly progressed. Repeat abdominal x-ray on 7/30 showed no definite evidence of bowel obstruction or ileus. On the day of discharge, patient was tolerating soft diet. Patient's blood pressure has been persistently elevated, steroids were weaned, however blood pressure continued to be elevated. She was started on low-dose amlodipine and was encouraged to check her blood pressures at home. She needs to follow-up with her primary care physician  as well as Dr. Lucianne Muss.   Discharge Diagnoses:  Principal Problem:   SBO (small bowel obstruction) (HCC) Active Problems:   MS (multiple sclerosis) (HCC)   Dysautonomia orthostatic hypotension syndrome (HCC)   Addison's disease (HCC)   Essential hypertension  Small bowel obstruction  In view of of abdominal surgeries in the past. Conservative management per surgical team Tolerating soft diet now and AXR showed resolution of obstruction   Leukocytosis - resolved Secondary steroids, she is receiving IV steroids for Addison's disease Tapering steroids No signs of infection  Addison disease Continue steroid taper, resume home florinef and medrol. Follow up with Dr. Lucianne Muss.   Essential HTN  New diagnosis. Initial thought was secondary to high-dose stress dose steroids although despite steroid tapering, patient continued to have elevated blood pressure during hospital stay. We'll start Norvasc 5 mg continue to monitor Advised patient to keep close eye on blood pressure at home and follow-up with primary care physician regarding this medication.   Discharge Instructions  Discharge Instructions    Call MD for:  difficulty breathing, headache or visual disturbances    Complete by:  As directed    Call MD for:  extreme fatigue    Complete by:  As directed    Call MD for:  hives    Complete by:  As directed    Call MD for:  persistant dizziness or light-headedness    Complete by:  As directed    Call MD for:  persistant nausea and vomiting    Complete by:  As directed    Call MD for:  severe uncontrolled pain    Complete by:  As directed    Call MD for:  temperature >100.4    Complete by:  As directed    Discharge instructions    Complete by:  As directed    You were cared for by a hospitalist during your hospital stay. If you have any questions about your discharge medications or the care you received while you were in the hospital after you are discharged, you can call the  unit and asked to speak with the hospitalist on call if the hospitalist that took care of you is not available. Once you are discharged, your primary care physician will handle any further medical issues. Please note that NO REFILLS for any discharge medications will be authorized once you are discharged, as it is imperative that you return to your primary care physician (or establish a relationship with a primary care physician if you do not have one) for your aftercare needs so that they can reassess your need for medications and monitor your lab values.   Increase activity slowly    Complete by:  As directed      Allergies as of 09/07/2016   No Known Allergies     Medication List    STOP taking these medications   cephALEXin 250 MG capsule Commonly known as:  KEFLEX   cyproheptadine 4 MG tablet Commonly known as:  PERIACTIN   Magnesium 400 MG Tabs   potassium chloride 10 MEQ tablet Commonly known as:  K-DUR,KLOR-CON     TAKE these medications   alfuzosin 10 MG 24 hr tablet Commonly known as:  UROXATRAL Take 10 mg by mouth daily with breakfast.   amLODipine 5 MG tablet Commonly known as:  NORVASC Take 1 tablet (5 mg total) by mouth daily.   CALCIUM 600-D 600-400 MG-UNIT Tabs Generic drug:  Calcium Carbonate-Vitamin D3 Take 1 tablet by mouth daily.   cyclobenzaprine 10 MG tablet Commonly known as:  FLEXERIL Take 10 mg by mouth 2 (two) times daily as needed for muscle spasms.   Ferrous Sulfate Dried 200 (65 Fe) MG Tabs Take 65 mg by mouth daily.   fludrocortisone 0.1 MG tablet Commonly known as:  FLORINEF Take 0.2 mg by mouth daily.   Melatonin 10 MG Tabs Take 10 mg by mouth at bedtime.   methylPREDNISolone 4 MG tablet Commonly known as:  MEDROL Take 2-4 mg by mouth 2 (two) times daily. Takes 4mg  ( 1 tablet) in morning and 2 mg (1/2 tablet) at 5pm   multivitamin tablet Take 1 tablet by mouth daily.   ondansetron 8 MG tablet Commonly known as:  ZOFRAN Take 8  mg by mouth as needed for nausea or vomiting.   promethazine 12.5 MG tablet Commonly known as:  PHENERGAN Take 1 tablet (12.5 mg total) by mouth every 8 (eight) hours as needed for nausea or vomiting.   STRESS B COMPLEX PO Take 1 tablet by mouth daily.   SYSTANE OP Apply 2 drops to eye daily as needed (for dry eyes). For both eyes   traZODone 100 MG tablet Commonly known as:  DESYREL Take 200 mg by mouth at bedtime.   VITAMIN D3 SUPER STRENGTH 2000 units Caps Generic drug:  Cholecalciferol Take 2,000 Units by mouth daily.      Follow-up Information    [name], MD. Schedule an appointment as soon as possible for a visit in 1 week(s).   Specialty:  Family Medicine Contact information: [phone] [place] [address] [phone]  Reather Littler, MD. Schedule an appointment as soon as possible for a visit in 1 week(s).   Specialty:  Endocrinology Contact information: [address] [place] [address] [phone]          No Known Allergies  Consultations:  General Surgery   Procedures/Studies: Dg Abd 1 View  Result Date: 09/05/2016 CLINICAL DATA:  Lower abdominal pain. EXAM: ABDOMEN - 1 VIEW COMPARISON:  Radiograph of September 04, 2016. FINDINGS: Postsurgical changes are seen involving the lower lumbar spine. Surgical clip is noted in the pelvis. No significant bowel dilatation is noted. No definite renal calculi are noted. IMPRESSION: No definite evidence of bowel obstruction or ileus. Electronically Signed   By: Lupita Raider, M.D.   On: 09/05/2016 08:21   Dg Abdomen 1 View  Result Date: 09/01/2016 CLINICAL DATA:  Nausea vomiting diarrhea since Saturday. EXAM: ABDOMEN - 1 VIEW COMPARISON:  None. FINDINGS: Mild gaseous distention of bowel, more likely nonobstructive. Extensive bowel suture in the pelvic midline. No biliary or urinary calculi are evident. No free air is evident. IMPRESSION: Mild gaseous distention of  bowel, more likely nonobstructive. Electronically Signed   By: Ellery Plunk M.D.   On: 09/01/2016 04:11   Ct Abdomen Pelvis W Contrast  Result Date: 09/01/2016 CLINICAL DATA:  Nausea. Vomiting. Diarrhea. Four days duration. Abnormal urinalysis. EXAM: CT ABDOMEN AND PELVIS WITH CONTRAST TECHNIQUE: Multidetector CT imaging of the abdomen and pelvis was performed using the standard protocol following bolus administration of intravenous contrast. CONTRAST:  100 cc Isovue-300 COMPARISON:  01/01/2014 FINDINGS: Lower chest: Mild atelectasis at the lung bases. Hepatobiliary: Previous cholecystectomy. Pronounced intra and extra hepatic ductal dilatation. No evidence of calcified ductal stone. No ampullary region mass is visible by CT. Pancreas: Dilated pancreatic ductal system as well. In the setting of a dilated biliary ductal system, possibilities include an occult ampullary mass or an incompetent sphincter. Spleen: Normal Adrenals/Urinary Tract: Adrenal glands are normal. Kidneys are normal except for a few tiny cysts. No evidence of mass, stone or hydronephrosis. Bladder appears normal. Stomach/Bowel: Markedly dilated fluid-filled stomach and small intestine consistent with small bowel obstruction. There are collapsed loops of distal small intestine. Patient appears to of head subtotal colectomy. Vascular/Lymphatic: Aortic atherosclerosis. No aneurysm. IVC is normal. No retroperitoneal adenopathy. Reproductive: Previous hysterectomy.  No pelvic mass. Other: Small amount of free intraperitoneal fluid.  No free air. Musculoskeletal: Previous lumbosacral discectomy and fusion L5-S1. IMPRESSION: High-grade small bowel obstruction in this patient has had previous subtotal colectomy. Markedly dilated fluid and air-filled stomach and proximal small bowel with collapsed distal small bowel. Small amount of free fluid, presumably secondary to the small bowel obstruction. No free air. Marked intra and extra hepatic biliary  ductal dilatation in this patient has had previous cholecystectomy. Dilated pancreatic ductal system. Differential diagnosis includes ampullary stricture, occult ampullary mass, and incompetent sphincter in the setting of high-grade small bowel obstruction. Aortic atherosclerosis. Electronically Signed   By: Paulina Fusi M.D.   On: 09/01/2016 08:12   Dg Abd Portable 1v  Result Date: 09/04/2016 CLINICAL DATA:  Nasogastric tube placement EXAM: PORTABLE ABDOMEN - 1 VIEW COMPARISON:  09/02/2016 FINDINGS: The nasogastric tube terminates in the distal esophagus and does not extend below the diaphragm. Marked gaseous distention of visible bowel in the abdomen. No extraluminal gas is evident. IMPRESSION: The nasogastric tube does not extend below the diaphragm. It terminates in the distal esophagus. These results will be called to the ordering clinician or representative by the  Printmaker, and communication documented in the PACS or zVision Dashboard. Electronically Signed   By: Ellery Plunk M.D.   On: 09/04/2016 04:55   Dg Abd Portable 1v  Result Date: 09/02/2016 CLINICAL DATA:  Small bowel obstruction EXAM: PORTABLE ABDOMEN - 1 VIEW COMPARISON:  09/01/2016 FINDINGS: NG tube is in stable position. Continued dilated small bowel loops compatible with small bowel obstruction. Oral contrast material is noted within the rectosigmoid colon. Prior cholecystectomy. IMPRESSION: Continued partial small bowel obstruction pattern. No significant change. Electronically Signed   By: Charlett Nose M.D.   On: 09/02/2016 07:42   Dg Abd Portable 1v-small Bowel Obstruction Protocol-initial, 8 Hr Delay  Result Date: 09/01/2016 CLINICAL DATA:  Small bowel obstruction protocol. EXAM: PORTABLE ABDOMEN - 1 VIEW COMPARISON:  09/01/2016 FINDINGS: Oral contrast is seen within mildly dilated small small bowel loops in the right abdomen. Scattered contrast is seen within the left colon. Postsurgical changes in the pelvis.  Residual contrast within the urinary bladder. IMPRESSION: Residual small bowel contrast within mildly dilated loops in the right mid abdomen. Electronically Signed   By: Ted Mcalpine M.D.   On: 09/01/2016 20:08   Dg Abd Portable 1v-small Bowel Protocol-position Verification  Result Date: 09/01/2016 CLINICAL DATA:  NG tube placement EXAM: PORTABLE ABDOMEN - 1 VIEW COMPARISON:  CT abdomen 09/01/2016 FINDINGS: There is gaseous distension of the small bowel most consistent with small bowel obstruction. Nasogastric tube with the tip projecting over the stomach. There is no evidence of pneumoperitoneum, portal venous gas or pneumatosis. There are no pathologic calcifications along the expected course of the ureters. Posterior spinal fusion at L5-S1. IMPRESSION: Gaseous distension of the small bowel most consistent with small bowel obstruction. Nasogastric tube with the tip projecting over the stomach. Electronically Signed   By: Elige Ko   On: 09/01/2016 11:15       Discharge Exam: Vitals:   09/07/16 0606 09/07/16 1424  BP: (!) 148/64 120/71  Pulse: 88 99  Resp: 20 20  Temp: 98.1 F (36.7 C) 99.3 F (37.4 C)   Vitals:   09/06/16 2204 09/07/16 0606 09/07/16 0719 09/07/16 1424  BP: (!) 158/80 (!) 148/64  120/71  Pulse: 84 88  99  Resp: 20 20  20   Temp: 98.5 F (36.9 C) 98.1 F (36.7 C)  99.3 F (37.4 C)  TempSrc: Oral Oral  Oral  SpO2: 100% 100%  98%  Weight:   46.9 kg (103 lb 6.4 oz)   Height:        General: Pt is alert, awake, not in acute distress Cardiovascular: RRR, S1/S2 +, no rubs, no gallops Respiratory: CTA bilaterally, no wheezing, no rhonchi Abdominal: Soft, NT, ND, bowel sounds + Extremities: no edema, no cyanosis    The results of significant diagnostics from this hospitalization (including imaging, microbiology, ancillary and laboratory) are listed below for reference.     Microbiology: No results found for this or any previous visit (from the past 240  hour(s)).   Labs: BNP (last 3 results) No results for input(s): BNP in the last 8760 hours. Basic Metabolic Panel:  Recent Labs Lab 09/02/16 0538 09/03/16 0538 09/05/16 0340 09/06/16 0357 09/07/16 0333  NA 138 138 135 133* 134*  K 3.1* 4.1 4.1 3.6 3.9  CL 105 106 107 104 102  CO2 23 25 21* 21* 24  GLUCOSE 125* 107* 79 88 89  BUN 14 10 10 12 9   CREATININE 0.64 0.69 0.72 0.67 0.65  CALCIUM 7.5* 8.2* 7.4* 7.4*  8.5*  MG  --  1.7  --   --   --    Liver Function Tests:  Recent Labs Lab 09/01/16 0153 09/06/16 1627 09/07/16 0333  AST 25  --  21  ALT 18  --  35  ALKPHOS 41  --  39  BILITOT 0.5  --  0.4  PROT 6.9  --  5.9*  ALBUMIN 4.0 3.7 3.4*    Recent Labs Lab 09/01/16 0153  LIPASE 36   No results for input(s): AMMONIA in the last 168 hours. CBC:  Recent Labs Lab 09/02/16 0538 09/03/16 0538 09/04/16 0801 09/05/16 0340 09/06/16 0357  WBC 7.6 12.1* 13.5* 8.6 9.5  NEUTROABS  --  9.6*  --  6.0  --   HGB 10.0* 10.5* 10.6* 10.4* 10.6*  HCT 29.2* 31.4* 31.7* 30.6* 31.2*  MCV 86.4 87.2 85.9 86.7 86.0  PLT 206 277 300 266 277   Cardiac Enzymes: No results for input(s): CKTOTAL, CKMB, CKMBINDEX, TROPONINI in the last 168 hours. BNP: Invalid input(s): POCBNP CBG: No results for input(s): GLUCAP in the last 168 hours. D-Dimer No results for input(s): DDIMER in the last 72 hours. Hgb A1c No results for input(s): HGBA1C in the last 72 hours. Lipid Profile No results for input(s): CHOL, HDL, LDLCALC, TRIG, CHOLHDL, LDLDIRECT in the last 72 hours. Thyroid function studies No results for input(s): TSH, T4TOTAL, T3FREE, THYROIDAB in the last 72 hours.  Invalid input(s): FREET3 Anemia work up No results for input(s): VITAMINB12, FOLATE, FERRITIN, TIBC, IRON, RETICCTPCT in the last 72 hours. Urinalysis    Component Value Date/Time   COLORURINE YELLOW 09/01/2016 0605   APPEARANCEUR CLOUDY (A) 09/01/2016 0605   LABSPEC 1.020 09/01/2016 0605   PHURINE 6.0  09/01/2016 0605   GLUCOSEU NEGATIVE 09/01/2016 0605   GLUCOSEU NEGATIVE 01/21/2016 1628   HGBUR NEGATIVE 09/01/2016 0605   BILIRUBINUR NEGATIVE 09/01/2016 0605   BILIRUBINUR negative 01/21/2016 1212   KETONESUR 5 (A) 09/01/2016 0605   PROTEINUR 30 (A) 09/01/2016 0605   UROBILINOGEN 0.2 01/21/2016 1628   NITRITE NEGATIVE 09/01/2016 0605   LEUKOCYTESUR MODERATE (A) 09/01/2016 0605   Sepsis Labs Invalid input(s): PROCALCITONIN,  WBC,  LACTICIDVEN Microbiology No results found for this or any previous visit (from the past 240 hour(s)).   Time coordinating discharge: 40 minutes  SIGNED:  Noralee Stain, DO [facility] Pager [phone]  If 7PM-7AM, please contact night-coverage [email or web address] Password [email or web address] 09/07/2016, 2:26 PM

## 2016-09-07 NOTE — Discharge Instructions (Signed)
Soft-Food Meal Plan °A soft-food meal plan includes foods that are safe and easy to swallow. This meal plan typically is used: °· If you are having trouble chewing or swallowing foods. °· As a transition meal plan after only having had liquid meals for a long period. ° °What do I need to know about the soft-food meal plan? °A soft-food meal plan includes tender foods that are soft and easy to chew and swallow. In most cases, bite-sized pieces of food are easier to swallow. A bite-sized piece is about ½ inch or smaller. Foods in this plan do not need to be ground or pureed. °Foods that are very hard, crunchy, or sticky should be avoided. Also, breads, cereals, yogurts, and desserts with nuts, seeds, or fruits should be avoided. °What foods can I eat? °Grains °Rice and wild rice. Moist bread, dressing, pasta, and noodles. Well-moistened dry or cooked cereals, such as farina (cooked wheat cereal), oatmeal, or grits. Biscuits, breads, muffins, pancakes, and waffles that have been well moistened. °Vegetables °Shredded lettuce. Cooked, tender vegetables, including potatoes without skins. Vegetable juices. Broths or creamed soups made with vegetables that are not stringy or chewy. Strained tomatoes (without seeds). °Fruits °Canned or well-cooked fruits. Soft (ripe), peeled fresh fruits, such as peaches, nectarines, kiwi, cantaloupe, honeydew melon, and watermelon (without seeds). Soft berries with small seeds, such as strawberries. Fruit juices (without pulp). °Meats and Other Protein Sources °Moist, tender, lean beef. Mutton. Lamb. Veal. Chicken. Turkey. Liver. Ham. Fish without bones. Eggs. °Dairy °Milk, milk drinks, and cream. Plain cream cheese and cottage cheese. Plain yogurt. °Sweets/Desserts °Flavored gelatin desserts. Custard. Plain ice cream, frozen yogurt, sherbet, milk shakes, and malts. Plain cakes and cookies. Plain hard candy. °Other °Butter, margarine (without trans fat), and cooking oils. Mayonnaise. Cream  sauces. Mild spices, salt, and sugar. Syrup, molasses, honey, and jelly. °The items listed above may not be a complete list of recommended foods or beverages. Contact your dietitian for more options. °What foods are not recommended? °Grains °Dry bread, toast, crackers that have not been moistened. Coarse or dry cereals, such as bran, granola, and shredded wheat. Tough or chewy crusty breads, such as French bread or baguettes. °Vegetables °Corn. Raw vegetables except shredded lettuce. Cooked vegetables that are tough or stringy. Tough, crisp, fried potatoes and potato skins. °Fruits °Fresh fruits with skins or seeds or both, such as apples, pears, or grapes. Stringy, high-pulp fruits, such as papaya, pineapple, coconut, or mango. Fruit leather, fruit roll-ups, and all dried fruits. °Meats and Other Protein Sources °Sausages and hot dogs. Meats with gristle. Fish with bones. Nuts, seeds, and chunky peanut or other nut butters. °Sweets/Desserts °Cakes or cookies that are very dry or chewy. °The items listed above may not be a complete list of foods and beverages to avoid. Contact your dietitian for more information. °This information is not intended to replace advice given to you by your health care provider. Make sure you discuss any questions you have with your health care provider. °Document Released: 05/03/2007 Document Revised: 07/02/2015 Document Reviewed: 12/21/2012 °Elsevier Interactive Patient Education © 2017 [employer] ° °

## 2016-09-09 ENCOUNTER — Ambulatory Visit (INDEPENDENT_AMBULATORY_CARE_PROVIDER_SITE_OTHER): Payer: PPO | Admitting: Endocrinology

## 2016-09-09 ENCOUNTER — Encounter: Payer: Self-pay | Admitting: Endocrinology

## 2016-09-09 VITALS — BP 100/48 | HR 102 | Ht 60.0 in | Wt 100.0 lb

## 2016-09-09 DIAGNOSIS — E2749 Other adrenocortical insufficiency: Secondary | ICD-10-CM | POA: Diagnosis not present

## 2016-09-09 DIAGNOSIS — E876 Hypokalemia: Secondary | ICD-10-CM

## 2016-09-09 DIAGNOSIS — G903 Multi-system degeneration of the autonomic nervous system: Secondary | ICD-10-CM

## 2016-09-09 DIAGNOSIS — R634 Abnormal weight loss: Secondary | ICD-10-CM

## 2016-09-09 DIAGNOSIS — I951 Orthostatic hypotension: Secondary | ICD-10-CM

## 2016-09-09 NOTE — Patient Instructions (Addendum)
Stop Amlodipine  Potassium 1 daily for 1 week then 3 daily  Start Mag.

## 2016-09-09 NOTE — Progress Notes (Signed)
Patient ID: Katrina Rivera, female   DOB: [birth year], 71 y.o.   MRN: [id number]   Subjective:         Chief complaint: Follow-up For various issues   PROBLEM 1: Dysautonomic orthostatic hypotension   PAST history: She has had long-standing problems with orthostatic hypotension and also hyponatremia. Has been diagnosed with dysautonomia and has multiple other problems related to autonomic neuropathy.   She has been on Florinef since 2004 previously taking 3 tablets daily along with 5 tablets of potassium which had previously controlled her symptoms well . Because of persistent orthostatic symptoms she had been tried on midodrine on 05/28/12 but this was later stopped when blood pressure increased.  She  had medication adjustments done frequently over the last year for regulating her blood pressure.  RECENT history:   She has  been on variable doses of Florinef, as much as 3 a day, Currently taking 2 tablets daily  Recently during her hospitalization her blood pressure was apparently high She has not checked her blood pressure since her discharge  Subjectively doing fairly well with no recent lightheadedness on standing Usually at home her blood pressure tends to fluctuate especially sitting    Hypokalemia: Controlled. She is usually taking 3 tablets of potassium She was apparently supplemented with potassium in the hospital but not clear why she was told not to continue this Last potassium 3.9  Lab Results  Component Value Date   CREATININE 0.65 09/07/2016   BUN 9 09/07/2016   NA 134 (L) 09/07/2016   K 3.9 09/07/2016   CL 102 09/07/2016   CO2 24 09/07/2016     Adrenal Insufficiency:   This is secondary to pituitary dysfunction  Prior testing included Cortrosyn test showing stimulated level of 15.7, baseline 3.9.  Also confirmed by 24 hour urine free cortisol which was only 3.  Although previously she had tolerated oral hydrocortisone and small doses with  improvement in her symptoms she did not continue this long-term She was again symptomatic with weight loss, decreased appetite, nausea and also diarrhea Cortrosyn stimulation test on 03/12/14 showed baseline cortisol level of 0.3 and post injection of 1.1 only  She had GI side effects from hydrocortisone and prednisone and was given hydrocortisone injections using insulin syringe   RECENT HISTORY:  Currently is on methylprednisolone, previously was on hydrocortisone injections because of GI intolerance with oral supplements    She is able to tolerate taking 4 mg at breakfast and 2 mg at dinnertime She usually drinking boost in the morning and does not have any nausea with taking the tablet  She thinks her appetite is coming back She has lost weight because of her recent GI problems  Wt Readings from Last 3 Encounters:  09/09/16 100 lb (45.4 kg)  09/07/16 103 lb 6.4 oz (46.9 kg)  08/18/16 107 lb 6.4 oz (48.7 kg)    General Endocrinology:  ?  Secondary hypothyroidism Following is a copy of the previous note   She has had a low normal free T4 in the past  but did not tolerate thyroxine supplementation in the past which were tried because of her continued symptoms of fatigue Free T4 has been  consistently normal  TSH is also normal again Has not had any abnormalities of the pituitary on previous MRI of the brain  Lab Results  Component Value Date   TSH 2.40 08/15/2016   TSH 0.72 03/29/2016   TSH 1.67 10/08/2015   FREET4 0.89 08/15/2016  FREET4 0.74 03/29/2016   FREET4 0.74 10/08/2015      Allergies as of 09/09/2016   No Known Allergies     Medication List       Accurate as of 09/09/16  8:54 AM. Always use your most recent med list.          alfuzosin 10 MG 24 hr tablet Commonly known as:  UROXATRAL Take 10 mg by mouth daily with breakfast.   amLODipine 5 MG tablet Commonly known as:  NORVASC Take 1 tablet (5 mg total) by mouth daily.   CALCIUM 600-D 600-400  MG-UNIT Tabs Generic drug:  Calcium Carbonate-Vitamin D3 Take 1 tablet by mouth daily.   cyclobenzaprine 10 MG tablet Commonly known as:  FLEXERIL Take 10 mg by mouth 2 (two) times daily as needed for muscle spasms.   Ferrous Sulfate Dried 200 (65 Fe) MG Tabs Take 65 mg by mouth daily.   fludrocortisone 0.1 MG tablet Commonly known as:  FLORINEF Take 0.2 mg by mouth daily.   Melatonin 10 MG Tabs Take 10 mg by mouth at bedtime.   methylPREDNISolone 4 MG tablet Commonly known as:  MEDROL Take 2-4 mg by mouth 2 (two) times daily. Takes 4mg  ( 1 tablet) in morning and 2 mg (1/2 tablet) at 5pm   multivitamin tablet Take 1 tablet by mouth daily.   ondansetron 8 MG tablet Commonly known as:  ZOFRAN Take 8 mg by mouth as needed for nausea or vomiting.   promethazine 12.5 MG tablet Commonly known as:  PHENERGAN Take 1 tablet (12.5 mg total) by mouth every 8 (eight) hours as needed for nausea or vomiting.   STRESS B COMPLEX PO Take 1 tablet by mouth daily.   SYSTANE OP Apply 2 drops to eye daily as needed (for dry eyes). For both eyes   traZODone 100 MG tablet Commonly known as:  DESYREL Take 200 mg by mouth at bedtime.   VITAMIN D3 SUPER STRENGTH 2000 units Caps Generic drug:  Cholecalciferol Take 2,000 Units by mouth daily.       Review of Systems   Recent bowel obstruction: She is going to follow-up with her gastroenterologist and PCP  FATIGUE and difficulty sleeping: She continues to have  Insomnia,  taking trazodone  Also on Cymbalta   ANEMIA:  Previously had been given iron infusion by her PCP, this was given on 03/21/16 Recent hemoglobin was low again  Genitourinary: She has had difficulty emptying her bladder and is being treated by urologist with various medications, she is asking about restarting antibiotic that previously had been given     Objective:   Physical Exam  BP 112/60   Pulse (!) 102   Ht 5' (1.524 m)   Wt 100 lb (45.4 kg)   SpO2 98%    BMI 19.53 kg/m   Blood pressure Repeated standing = 100/48 Pulse 100 Her mucous membranes are moist No peripheral edema She looks slightly pale  Assessment:       Adrenal insufficiency:  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 and very low cortisol levels on the stimulation test  Since she Has been able to tolerate methylprednisolone very well without GI side effects or recently instead of injections she is going to continue these She was given stress doses in the hospital and now is on maintenance doses again Does not have any symptoms of adrenal insufficiency now with nausea, decreased appetite or lightheadedness  She is able to take her medication consistently  and will continue the same dose   Orthostatic dysautonomic hypotension syndrome:   Her blood pressure appears to be improved now compared to when she was in the hospital with high systolic readings  Since she is on amlodipine and blood pressure is low normal appears not to be needing this anymore Mild She will go back to continuing Florinef 2 tablets daily No orthostasis today She does need to check her blood pressure regularly at home and will start doing this daily for now, she can do this any time of the day  Weight loss: Recently worse with hospitalization  Sinus tachycardia: This is persisting, asymptomatic.  This may be related to autonomic neuropathy, has variable heart rate but not excessive despite increasing caffeine  Plan:       As above  She will need to go back on her potassium supplementation since she is continuing Florinef She can also started back on magnesium supplements as her last level was low normal and she is not doing well nutritionally recently  STOP amlodipine  Advised her to check her blood pressure regularly again daily including standing No change in Florinef  Follow-up in 1 month   Total visit time for evaluation and management of multiple problems,  review of Hospital records, labs, , counseling = 25 minutes  There are no Patient Instructions on file for this visit. Marland Kitchen  Jesslyn Viglione  Note: This office note was prepared with Insurance underwriter. Any transcriptional errors that result from this process are unintentional.

## 2016-09-13 DIAGNOSIS — E2740 Unspecified adrenocortical insufficiency: Secondary | ICD-10-CM | POA: Diagnosis not present

## 2016-09-13 DIAGNOSIS — K5900 Constipation, unspecified: Secondary | ICD-10-CM | POA: Diagnosis not present

## 2016-09-13 DIAGNOSIS — Z8719 Personal history of other diseases of the digestive system: Secondary | ICD-10-CM | POA: Diagnosis not present

## 2016-09-13 DIAGNOSIS — R933 Abnormal findings on diagnostic imaging of other parts of digestive tract: Secondary | ICD-10-CM | POA: Diagnosis not present

## 2016-09-13 DIAGNOSIS — R634 Abnormal weight loss: Secondary | ICD-10-CM | POA: Diagnosis not present

## 2016-09-13 DIAGNOSIS — R110 Nausea: Secondary | ICD-10-CM | POA: Diagnosis not present

## 2016-09-16 ENCOUNTER — Telehealth: Payer: Self-pay | Admitting: Endocrinology

## 2016-09-16 NOTE — Telephone Encounter (Signed)
Pt was notified of instructions, pt verbalized understanding.   

## 2016-09-16 NOTE — Telephone Encounter (Signed)
Dr. Elvera Lennox please advise. Thanks!

## 2016-09-16 NOTE — Telephone Encounter (Signed)
If her blood pressure is below 100 systolic on standing up she can increase the dose from 2 tablets to 3 tablets

## 2016-09-16 NOTE — Telephone Encounter (Signed)
Patient called to advise that the fludrocortisone (FLORINEF) 0.1 MG tablet [[id number]]   is making her extremely lightheaded when she stands up. She asks for advice, because she is about to go on vacation. Verified mobile #

## 2016-09-30 DIAGNOSIS — E559 Vitamin D deficiency, unspecified: Secondary | ICD-10-CM | POA: Diagnosis not present

## 2016-09-30 DIAGNOSIS — M810 Age-related osteoporosis without current pathological fracture: Secondary | ICD-10-CM | POA: Diagnosis not present

## 2016-10-07 ENCOUNTER — Other Ambulatory Visit (INDEPENDENT_AMBULATORY_CARE_PROVIDER_SITE_OTHER): Payer: PPO

## 2016-10-07 DIAGNOSIS — E2749 Other adrenocortical insufficiency: Secondary | ICD-10-CM | POA: Diagnosis not present

## 2016-10-07 LAB — BASIC METABOLIC PANEL
BUN: 19 mg/dL (ref 6–23)
CALCIUM: 12.6 mg/dL — AB (ref 8.4–10.5)
CHLORIDE: 100 meq/L (ref 96–112)
CO2: 31 meq/L (ref 19–32)
Creatinine, Ser: 1.36 mg/dL — ABNORMAL HIGH (ref 0.40–1.20)
GFR: 40.97 mL/min — ABNORMAL LOW (ref >60.00)
Glucose, Bld: 94 mg/dL (ref 70–99)
Potassium: 4 mEq/L (ref 3.5–5.1)
SODIUM: 137 meq/L (ref 135–145)

## 2016-10-11 DIAGNOSIS — R110 Nausea: Secondary | ICD-10-CM | POA: Diagnosis not present

## 2016-10-11 DIAGNOSIS — E2740 Unspecified adrenocortical insufficiency: Secondary | ICD-10-CM | POA: Diagnosis not present

## 2016-10-11 DIAGNOSIS — R42 Dizziness and giddiness: Secondary | ICD-10-CM | POA: Diagnosis not present

## 2016-10-11 DIAGNOSIS — Z8719 Personal history of other diseases of the digestive system: Secondary | ICD-10-CM | POA: Diagnosis not present

## 2016-10-12 ENCOUNTER — Encounter: Payer: Self-pay | Admitting: Endocrinology

## 2016-10-12 ENCOUNTER — Ambulatory Visit (INDEPENDENT_AMBULATORY_CARE_PROVIDER_SITE_OTHER): Payer: PPO | Admitting: Endocrinology

## 2016-10-12 VITALS — BP 152/88 | HR 88 | Ht 60.0 in | Wt 101.0 lb

## 2016-10-12 DIAGNOSIS — G903 Multi-system degeneration of the autonomic nervous system: Secondary | ICD-10-CM

## 2016-10-12 DIAGNOSIS — I951 Orthostatic hypotension: Secondary | ICD-10-CM

## 2016-10-12 NOTE — Progress Notes (Signed)
Patient ID: Katrina Rivera, female   DOB: February 17, 1945, 71 y.o.   MRN: [id number]   Subjective:         Chief complaint: Follow-up of various issues   PROBLEM 1: Dysautonomic orthostatic hypotension   PAST history: She has had long-standing problems with orthostatic hypotension and also hyponatremia. Has been diagnosed with dysautonomia and has multiple other problems related to autonomic neuropathy.   She has been on Florinef since 2004 previously taking 3 tablets daily along with 5 tablets of potassium which had previously controlled her symptoms well . Because of persistent orthostatic symptoms she had been tried on midodrine on 05/28/12 but this was later stopped when blood pressure increased.  She  had medication adjustments done frequently over the last year for regulating her blood pressure.  RECENT history:   She has  been on variable doses of Florinef, as much as 3 a day, she is still taking 2 tablets daily  Although she has checked her blood pressure very sporadically most of her readings have been fairly good and only once had a systolic of 100 standing On her last visit she was told to stop the amlodipine she was taking at the hospital discharge time  Subjectively she still feels dizzy but this is more of a sense of being off balance and is variable    Hypokalemia: Controlled on supplements. She is usually taking 3 tablets of potassium   Lab Results  Component Value Date   CREATININE 1.36 (H) 10/07/2016   BUN 19 10/07/2016   NA 137 10/07/2016   K 4.0 10/07/2016   CL 100 10/07/2016   CO2 31 10/07/2016    Lab Results  Component Value Date   CALCIUM 12.6 (H) 10/07/2016   PHOS 1.9 (L) 07/23/2006    Adrenal Insufficiency:   This is secondary to pituitary dysfunction  Prior testing included Cortrosyn test showing stimulated level of 15.7, baseline 3.9.  Also confirmed by 24 hour urine free cortisol which was only 3.  Although previously she had  tolerated oral hydrocortisone and small doses with improvement in her symptoms she did not continue this long-term She was again symptomatic with weight loss, decreased appetite, nausea and also diarrhea Cortrosyn stimulation test on 03/12/14 showed baseline cortisol level of 0.3 and post injection of 1.1 only  She had GI side effects from hydrocortisone and prednisone and was given hydrocortisone injections using insulin syringe   RECENT HISTORY:  She is on methylprednisolone, previously was on hydrocortisone injections because of GI intolerance with oral supplements    She is taking 4 mg at breakfast and 2 mg at dinnertime She usually drinking boost in the morning Although on her last visit to month ago she was doing well without any nausea she is now having fairly significant nausea She was given Phenergan by her PCP but still has had some nausea today No change in bowel habits  She thinks her appetite is worse again although weight is stable She is drinking boost   Wt Readings from Last 3 Encounters:  10/12/16 101 lb (45.8 kg)  09/09/16 100 lb (45.4 kg)  09/07/16 103 lb 6.4 oz (46.9 kg)    General Endocrinology:  ?  Secondary hypothyroidism Following is a copy of the previous note   She has had a low normal free T4 in the past  but did not tolerate thyroxine supplementation in the past which were tried because of her continued symptoms of fatigue Free T4 has been  consistently normal  TSH is also normal again Has not had any abnormalities of the pituitary on previous MRI of the brain  Lab Results  Component Value Date   TSH 2.40 08/15/2016   TSH 0.72 03/29/2016   TSH 1.67 10/08/2015   FREET4 0.89 08/15/2016   FREET4 0.74 03/29/2016   FREET4 0.74 10/08/2015      Allergies as of 10/12/2016   No Known Allergies     Medication List       Accurate as of 10/12/16  4:50 PM. Always use your most recent med list.          alfuzosin 10 MG 24 hr tablet Commonly known as:   UROXATRAL Take 10 mg by mouth daily with breakfast.   amLODipine 5 MG tablet Commonly known as:  NORVASC Take 1 tablet (5 mg total) by mouth daily.   CALCIUM 600-D 600-400 MG-UNIT Tabs Generic drug:  Calcium Carbonate-Vitamin D3 Take 1 tablet by mouth daily.   cyclobenzaprine 10 MG tablet Commonly known as:  FLEXERIL Take 10 mg by mouth 2 (two) times daily as needed for muscle spasms.   Ferrous Sulfate Dried 200 (65 Fe) MG Tabs Take 65 mg by mouth daily.   fludrocortisone 0.1 MG tablet Commonly known as:  FLORINEF Take 0.2 mg by mouth daily.   Melatonin 10 MG Tabs Take 10 mg by mouth at bedtime.   methylPREDNISolone 4 MG tablet Commonly known as:  MEDROL Take 2-4 mg by mouth 2 (two) times daily. Takes 4mg  ( 1 tablet) in morning and 2 mg (1/2 tablet) at 5pm   multivitamin tablet Take 1 tablet by mouth daily.   promethazine 25 MG tablet Commonly known as:  PHENERGAN Take 25 mg by mouth every 6 (six) hours as needed for nausea or vomiting.   STRESS B COMPLEX PO Take 1 tablet by mouth daily.   SYSTANE OP Apply 2 drops to eye daily as needed (for dry eyes). For both eyes   traZODone 100 MG tablet Commonly known as:  DESYREL Take 200 mg by mouth at bedtime.   TYLENOL 8 HOUR ARTHRITIS PAIN PO Take 1-2 tablets by mouth daily.   VITAMIN D3 SUPER STRENGTH 2000 units Caps Generic drug:  Cholecalciferol Take 2,000 Units by mouth daily.       Review of Systems    ANEMIA:  Previously had been given iron infusion by her PCP, this was given on 03/21/16  Genitourinary: She has had difficulty emptying her bladder and is being treated by urologist with various medications     Objective:   Physical Exam  BP (!) 152/88 (BP Location: Left Arm, Cuff Size: Normal)   Pulse 88   Ht 5' (1.524 m)   Wt 101 lb (45.8 kg)   SpO2 95%   BMI 19.73 kg/m   Blood pressure Repeated standing = 140/68   Assessment:       Adrenal insufficiency:  She has Long-standing  secondary adrenal insufficiency as diagnosed by her symptoms and very low cortisol levels on the stimulation test  Although she does not have any other symptoms other than nausea not clear if she is having inadequate effect of her methylprednisolone with her recent nausea For now will have her switch temporarily to Solu-Cortef injections which she had done before She can try taking 20 mg this evening and go to the usual dose of 20 mg in the morning and 10 mg in the late afternoon   Orthostatic dysautonomic hypotension syndrome:   Her  blood pressure is fairly good today in the office although has been variable at home She will continue 2 tablets of Florinef  DEHYDRATION and hypercalcemia: Her creatinine is significantly higher than baseline and calcium is 12.6 as of last week Previously also her calcium and gone up with dehydration and she is also taking a calcium supplement for osteopenia   Plan:       She will see her PCP tomorrow to have labs done to repeat her chemistry panel and consider intravenous fluids at short stay center  She will switch to injectable hydrocortisone as above and continue until her nausea is subsiding  No change in Florinef She will stop calcium supplements    Patient Instructions  Use 0.83ml tonite and then 0.4 am and 0.2 in pm  Get labs checked in am  No calcium  .  Saxon Barich  Note: This office note was prepared with Insurance underwriter. Any transcriptional errors that result from this process are unintentional.

## 2016-10-12 NOTE — Patient Instructions (Addendum)
Use 0.50ml tonite and then 0.4 am and 0.2 in pm  Get labs checked in am  No calcium

## 2016-10-14 ENCOUNTER — Telehealth: Payer: Self-pay | Admitting: Endocrinology

## 2016-10-14 DIAGNOSIS — R110 Nausea: Secondary | ICD-10-CM | POA: Diagnosis not present

## 2016-10-14 NOTE — Telephone Encounter (Signed)
Routing to you °

## 2016-10-14 NOTE — Telephone Encounter (Signed)
Patient called in stating she has an appointment at 2:30 with Dr. Tiburcio Pea today. Patient stated she needs lab results faxed over to Fax: [phone]. Please call patient and advise with any questions.

## 2016-10-14 NOTE — Telephone Encounter (Signed)
I have faxed over the labs for patient to Dr. Tiburcio Pea. Called patient and let her know that I have sent over her labs by fax to his office.

## 2016-10-17 DIAGNOSIS — R110 Nausea: Secondary | ICD-10-CM | POA: Diagnosis not present

## 2016-10-24 DIAGNOSIS — K5900 Constipation, unspecified: Secondary | ICD-10-CM | POA: Diagnosis not present

## 2016-10-24 DIAGNOSIS — N183 Chronic kidney disease, stage 3 (moderate): Secondary | ICD-10-CM | POA: Diagnosis not present

## 2016-10-24 DIAGNOSIS — G8929 Other chronic pain: Secondary | ICD-10-CM | POA: Diagnosis not present

## 2016-10-24 DIAGNOSIS — R933 Abnormal findings on diagnostic imaging of other parts of digestive tract: Secondary | ICD-10-CM | POA: Diagnosis not present

## 2016-10-25 NOTE — Progress Notes (Signed)
[facility]  PATIENT: Katrina Rivera DOB: [birth year]   REASON FOR VISIT: follow-up for multiple sclerosis HISTORY FROM: Patient and husband [name]    HISTORY OF PRESENT ILLNESS: HISTORY YYMrs. Rivera is a very pleasant 71 year-old right-handed woman, with relapsing remitting multiple sclerosis, was treated with Betaseron 4 -5 years, but could not tolerate the side effect, now is not on any immunomodulation therapy. neuropathic pain of bilateral lower extremities.  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   She has baseline gait difficulty, she fell in May 2013, fractured both elbows and her left knee cap. She intermittently has been using a cane or walker.  She could not tolerate Requip because of nausea. She has been on gabapentin 300 mg 3 bid. She gained significant weight when on Lyrica, and therefore was switched back to gabapentin. She has orthostatic hypotension, and is on midodrine 2.5 mg and fludrocortisone 0.1 mg 1 in AM and 2 at night, and she is also on 20 mg of hydrocortisone.  She complains of bilateral lower extremity burning stinging sensation, getting worse when she sits still, difficulty falling into sleep, she has the urge to move because of bilateral extremity discomfort, has tried Requip, could not tolerate it because of GI side effects, Neuprol patch does not work either, she also tried Elavil, Cymbalta in the past, could not tolerate it due to side effect.   Jan 01/2014:YYShe came in  urgently for acute worsening of her generalized condition since her lumbar decompression surgery in January 24 2013 by Dr. Marikay Alar, prior to surgery, she suffered left-sided low back pain, radiating pain to bilateral lower extremity, left is much worse than her right side, there was left L5-S1 extraforaminal herniated nucleus pulposus with left L5 radiculopathy she had left L5-S1 extraforaminal microdiscectomy utilizing microscopic dissection.  She had overnight stay at the hospital the day after surgery, she fell when trying to get up using bathroom, landed on her left side, surgery did help her lower back pain, radiating pain to her left leg, but she complained of worsening bilateral lower extremity deep achy pain, worsening gait difficulty, she has not used walker for 3 years, now she began to use her walker, she felt her skin is so tight at bilateral lower extremity, left leg swelling, also has difficulty swallowing, blurred vision, worsening fatigue, difficulty concentration, she has not had MRI evaluation for her multiple sclerosis for many years, is not on any immunomodulation therapy   UPDATE Mar 01, 2014:YYShe came in with a list of complains, much worse compared to presurgical level. She complains of blurred vision, difficulty sleeping, difficulty concentrating, dizziness, worsening gait difficulty, The most bothersome symptoms are bilateral lower extremity pain, constant, knife cutting pain, left worse than right Left leg showed no DVT on doppler study. She had 15 LB weight gain over one month, more difficulty sleepy, body shaking, more difficulty sleepy.  She has tried fentanyl patch 50 mcg, Trileptal, Neurontin without helping her symptoms .She is going to be seen by her surgeon Dr. Yetta Barre in 3 days  We have reviewed MRI of the brain  cervical lumbar spine together, these were done at [facility] imaging in January 2015 MRI lumbar: L5-S1: disc bulging and facet hypertrophy, status post  microdiscectomy on the left, with expected post-surgical changes. Multi-level facet hypertrophy, with no spinal stenosis or foraminal narrowing.  MRI cervical: Possible small chronic demyelinating plaque at C6 on the right side. No abnormal enhancing lesions  MRI brain: Multiple supratentorial and 1 infratentorial chronic demyelinating plaques. No acute plaques. Mild cerebellar tonsillar ectopia there was no significant change in the brain lesions,   UPDATE 7/16/15YY Patient returns for follow up.Her biggest complaint today is restless legs. She has never tried Neupro. She has had multiple trials of medications for her chronic pain. She is currently taking fentanyl, Trileptal, Cymbalta, lidocaine gel  UPDATE Feb 27 2014:YYShe is not on any long-term immunomodulation therapy for her relapsing remitting multiple sclerosis, neurological deficit has been fairly stable, she has baseline gait difficulty, the most bothersome symptoms for her is low back pain, bilateral lower extremity deep achy pain, radiating pain from left lower back to her left leg,  She was recently found to have very low cortisol level, under endocrinologist Dr. Remus Blake care, UPDATE June 15 2016YY: AS patient symptoms. Return to work with restrictions due to dizziness several days as well as we have a job description. She initially presented She had lumbar decompression by Dr. Marikay Alar in Jun 18 2014, which did help her low back pain, but now she experienced worsening left lower extremity spasm, left calf spasm so hard, as if a knot was tied, difficulty walking, she was giving a steroid package, no help, She complained excessive weight gain with Lyrica, Neurontin did not help, UPDATE July 27th 2016:YY She came in with a list of complaints, continue complains of unbalanced, staggering, generalized weakness, numbness tingling at bilateral lower extremity, worsening at her left leg, frequent urination, difficulty concentrating,  difficulty with multitasking UPDATE Oct 22 2014:YY She had a syncope episode in October 08 2014, was taken to the emergency room, have reviewed ED record, blood pressure was 200/60s laboratory showed normal CBC, CMP, this happened in the setting of missing her hydrocortisone dose, because of nausea, lack of appetite, dehydration, she is taking hydrocortisone because of Addison's disease, now she is on hydrocortisone shots,She is back to her baseline now, mild gait difficulty, also complains of anxiety, constant bilateral lower chamber paresthesia, she wants to get off Cymbalta 30 mg daily, worry about the long-term side effect. She also complains of bilateral neck, shoulder or upper extremity itching,  UPDATE Apr 05 2016:YY She had a spinal stimulator placement by neurosurgeon Dr. Buck Mam in January third 2018, which has helped her low back pain,and leg pain.  She had sinus infection, was treated with Zpack and nausea,  She has iron infusion in Feb 2018,  She noticed that she has worsening gait abnormality,  She also has diffuse body achy pain. She has tried Flexeril without benefit, previously tried and failed gabapentin, Cymbalta, fentanyl patch,  CPK was normal 39 WBC showed hemoglobin of 10 point 8, which was mildly decreased normal free T4, TSH, CMP, creat 1.1,  normal B12, UPDATE 09/19/2018CM Ms. Krotz, 71 year old female returns for follow-up with history of relapsing remitting multiple sclerosis, chronic low back pain with spinal stimulator in place and aching muscles. In terms of her MS her most aggravating symptom is mild gait difficulty. No recent falls She has no longer on immunomodulation therapy. She had previously been on Betaseron. Her chronic low back pain has much  improved with spinal cord stimulator. She only rarely takes her Flexeril for her aching muscles not on a consistent basis. In July she was admitted to the hospital for about a week for bowel obstruction. She returns for  reevaluation   REVIEW OF SYSTEMS: Full 14 system review of systems performed and notable only for those listed, all others are neg:  Constitutional: Fatigue  Cardiovascular: neg Ear/Nose/Throat: neg  Skin: neg Eyes: neg Respiratory: neg Gastroitestinal: neg  Hematology/Lymphatic: neg  Endocrine: neg Musculoskeletal:neg Allergy/Immunology: neg Neurological: Numbness Psychiatric: Anxiety Sleep : Insomnia   ALLERGIES: No Known Allergies  HOME MEDICATIONS: Outpatient Medications Prior to Visit  Medication Sig Dispense Refill  . Acetaminophen (TYLENOL 8 HOUR ARTHRITIS PAIN PO) Take 1-2 tablets by mouth daily.    Marland Kitchen alfuzosin (UROXATRAL) 10 MG 24 hr tablet Take 10 mg by mouth daily with breakfast.    . B Complex-C-Folic Acid (STRESS B COMPLEX PO) Take 1 tablet by mouth daily.    . Calcium Carbonate-Vitamin D3 (CALCIUM 600-D) 600-400 MG-UNIT TABS Take 1 tablet by mouth daily.    . Cholecalciferol (VITAMIN D3 SUPER STRENGTH) 2000 units CAPS Take 2,000 Units by mouth daily.     . cyclobenzaprine (FLEXERIL) 10 MG tablet Take 10 mg by mouth 2 (two) times daily as needed for muscle spasms.    . Ferrous Sulfate Dried 200 (65 FE) MG TABS Take 65 mg by mouth daily.    . fludrocortisone (FLORINEF) 0.1 MG tablet Take 0.2 mg by mouth daily.    . Melatonin 10 MG TABS Take 10 mg by mouth at bedtime.    . methylPREDNISolone (MEDROL) 4 MG tablet Take 2-4 mg by mouth 2 (two) times daily. Takes  ( 1 tablet) in morning and 2 mg (1/2 tablet) at 5pm    . Multiple Vitamin (MULTIVITAMIN) tablet Take 1 tablet by mouth daily.    Bertram Gala Glycol-Propyl Glycol (SYSTANE OP) Apply 2 drops to eye daily as needed (for dry eyes). For both eyes    . promethazine (PHENERGAN) 25 MG tablet Take 25 mg by mouth every 6 (six) hours as needed for nausea or vomiting.    . traZODone (DESYREL) 100 MG tablet Take 200 mg by mouth at bedtime.     Marland Kitchen amLODipine (NORVASC) 5 MG tablet Take 1 tablet (5 mg total) by mouth daily.  30 tablet 0   No facility-administered medications prior to visit.     PAST MEDICAL HISTORY: Past Medical History:  Diagnosis Date  . Addison's disease (HCC)    takes Solu Cortef daily  . Anemia    takes Ferrous Sulfate daily  . Anxiety    takes Xanax nightly  . Arthritis   . Chronic back pain    stenosis  . Depression    takes Cymbalta daily  . Dizziness    if b/p drops   . Fibromyalgia   . Fibromyalgia   . GERD (gastroesophageal reflux disease)   . Headache(784.0)   . History of blood transfusion    no abnormal reaction noted  . History of bronchitis    many yrs ago   . Hypokalemia    takes Potassium daily  . Hypotension   . Hypotension    takes Florinef daily  . IBS (irritable bowel syndrome)    takes Librarian, academic daily  . Insomnia    takes Trazodone nightly  . Joint pain   . Multiple sclerosis (HCC)    doesn't take any meds  . Multiple sclerosis (HCC)   .  Nocturia   . Osteoporosis   . Peripheral neuropathy   . Restless leg syndrome   . Seasonal allergies    takes Zyrtec daily;uses Flonase daily as needed  . Syncope   . Urinary frequency    takes Flomax daily  . Weakness    numbness and tingling    PAST SURGICAL HISTORY: Past Surgical History:  Procedure Laterality Date  . ABDOMINAL HYSTERECTOMY  1988  . APPENDECTOMY  1988  . CESAREAN SECTION  1973/1977   x2  . CHOLECYSTECTOMY  1997  . COLECTOMY  1990  . COLONOSCOPY    . ESOPHAGOGASTRODUODENOSCOPY    . EYE SURGERY     bilateral - /w IOL  . FRACTURE SURGERY Right    rods and screws  . LUMBAR LAMINECTOMY/DECOMPRESSION MICRODISCECTOMY Left 01/24/2013   Procedure: LUMBAR FIVE TO SACRAL ONE LUMBAR LAMINECTOMY/DECOMPRESSION MICRODISCECTOMY 1 LEVEL;  Surgeon: Tia Alert, MD;  Location: [facility];  Service: Neurosurgery;  Laterality: Left;  Marland Kitchen MAXIMUM ACCESS (MAS)POSTERIOR LUMBAR INTERBODY FUSION (PLIF) 1 LEVEL N/A 06/18/2014   Procedure: MAXIMUM ACCESS SURGERY POSTERIOR LUMBAR INTERBODY FUSION LUMBAR  FIVE TO SACRAL ONE ;  Surgeon: Tia Alert, MD;  Location: [facility];  Service: Neurosurgery;  Laterality: N/A;    FAMILY HISTORY: Family History  Problem Relation Age of Onset  . Hypertension Mother   . Stroke Mother   . Heart attack Father   . Tremor Brother     SOCIAL HISTORY: Social History   Social History  . Marital status: Married    Spouse name: [name]  . Number of children: 2  . Years of education: 12   Occupational History  .  Disabled    Disabled   Social History Main Topics  . Smoking status: Never Smoker  . Smokeless tobacco: Never Used  . Alcohol use No  . Drug use: No  . Sexual activity: Not on file   Other Topics Concern  . Not on file   Social History Narrative   Pt lives at home with spouse. ([name])   Caffeine Use: 1 cups daily.   Right handed.   Disabled.   Education - high school   Patient has two adult children.     PHYSICAL EXAM  Vitals:   10/26/16 1022  BP: (!) 150/69  Pulse: 71  Weight: 102 lb 12.8 oz (46.6 kg)   Body mass index is 20.08 kg/m.  Generalized: Well developed, in no acute distress  Head: normocephalic and atraumatic,. Oropharynx benign  Neck: Supple,  Musculoskeletal: No deformity   Neurological examination   Mentation: Alert oriented to time, place, history taking. Attention span and concentration appropriate. Recent and remote memory intact.  Follows all commands speech and language fluent.   Cranial nerve II-XII: Fundoscopic exam reveals sharp disc margins.Pupils were equal round reactive to light extraocular movements were full, visual field were full on confrontational test. Facial sensation and strength were normal. hearing was intact to finger rubbing bilaterally. Uvula tongue midline. head turning and shoulder shrug were normal and symmetric.Tongue protrusion into cheek strength was normal. Motor: Mild lower extremity spasticity no significant upper or lower weakness  Sensory: normal and symmetric to light  touch, pinprick, and  Vibration, in the upper and lower extremities  Coordination: finger-nose-finger, heel-to-shin bilaterally, no dysmetria Reflexes: Brachioradialis 2/2, biceps 2/2, triceps 2/2, patellar 2/2, Achilles 2/2, plantar responses were flexor bilaterally. Gait and Station: Rising up from seated position without assistance, normal stance,  moderate stride, good arm swing, smooth  turning, able to perform tiptoe, and heel walking without difficulty. Tandem gait is unsteady. No assistive device  DIAGNOSTIC DATA (LABS, IMAGING, TESTING) - I reviewed patient records, labs, notes, testing and imaging myself where available.  Lab Results  Component Value Date   WBC 9.5 09/06/2016   HGB 10.6 (L) 09/06/2016   HCT 31.2 (L) 09/06/2016   MCV 86.0 09/06/2016   PLT 277 09/06/2016      Component Value Date/Time   NA 137 10/07/2016 1029   K 4.0 10/07/2016 1029   CL 100 10/07/2016 1029   CO2 31 10/07/2016 1029   GLUCOSE 94 10/07/2016 1029   BUN 19 10/07/2016 1029   CREATININE 1.36 (H) 10/07/2016 1029   CREATININE 0.85 11/06/2013 0840   CALCIUM 12.6 (H) 10/07/2016 1029   PROT 5.9 (L) 09/07/2016 0333   ALBUMIN 3.4 (L) 09/07/2016 0333   AST 21 09/07/2016 0333   ALT 35 09/07/2016 0333   ALKPHOS 39 09/07/2016 0333   BILITOT 0.4 09/07/2016 0333   GFRNONAA >60 09/07/2016 0333   GFRAA >60 09/07/2016 0333    Lab Results  Component Value Date   VITAMINB12 1,190 (H) 03/10/2016   Lab Results  Component Value Date   TSH 2.40 08/15/2016      ASSESSMENT AND PLAN  71 y.o. year old female  has a past medical history of Addison's disease (HCC); Anemia; Anxiety; Arthritis; Chronic back pain; Depression; Insomnia; Joint pain; Multiple sclerosis (HCC); Multiple sclerosis (HCC); chronic low back pain and aching muscles.    PLAN: Relapsing remitting multiple sclerosis not on immunomodulation therapy with most recent stable findings on MRI in 2015 Back pain improved with spinal cord  stimulator placement Continue Flexeril for muscle aches per primary care Follow-up yearly and when necessary Nilda Riggs, [facility], [facility], APRN  [facility] [address], [address] [place], [place] [address] [phone]

## 2016-10-26 ENCOUNTER — Ambulatory Visit (INDEPENDENT_AMBULATORY_CARE_PROVIDER_SITE_OTHER): Payer: PPO | Admitting: Nurse Practitioner

## 2016-10-26 ENCOUNTER — Encounter: Payer: Self-pay | Admitting: Nurse Practitioner

## 2016-10-26 VITALS — BP 150/69 | HR 71 | Wt 102.8 lb

## 2016-10-26 DIAGNOSIS — G35 Multiple sclerosis: Secondary | ICD-10-CM

## 2016-10-26 DIAGNOSIS — R269 Unspecified abnormalities of gait and mobility: Secondary | ICD-10-CM | POA: Diagnosis not present

## 2016-10-26 NOTE — Patient Instructions (Addendum)
Relapsing remitting multiple sclerosis not on immunomodulation therapy with most recent stable findings on MRI in 2015 Back pain improved with spinal cord stimulator placement Continue Flexeril for muscle aches per primary care Follow-up yearly and when necessary

## 2016-10-28 NOTE — Progress Notes (Signed)
I have reviewed and agreed above plan. 

## 2016-10-31 ENCOUNTER — Telehealth: Payer: Self-pay | Admitting: Endocrinology

## 2016-10-31 NOTE — Telephone Encounter (Signed)
Misty Stanley has already filled this medication.

## 2016-10-31 NOTE — Telephone Encounter (Signed)
Please advise if okay to fill this medication. Last filled by historical provider.

## 2016-10-31 NOTE — Telephone Encounter (Signed)
Okay to refill? 

## 2016-11-11 DIAGNOSIS — J0100 Acute maxillary sinusitis, unspecified: Secondary | ICD-10-CM | POA: Diagnosis not present

## 2016-11-21 DIAGNOSIS — J0100 Acute maxillary sinusitis, unspecified: Secondary | ICD-10-CM | POA: Diagnosis not present

## 2016-11-23 DIAGNOSIS — H26491 Other secondary cataract, right eye: Secondary | ICD-10-CM | POA: Diagnosis not present

## 2016-11-23 DIAGNOSIS — H04123 Dry eye syndrome of bilateral lacrimal glands: Secondary | ICD-10-CM | POA: Diagnosis not present

## 2016-11-23 DIAGNOSIS — H35371 Puckering of macula, right eye: Secondary | ICD-10-CM | POA: Diagnosis not present

## 2016-11-23 DIAGNOSIS — Z961 Presence of intraocular lens: Secondary | ICD-10-CM | POA: Diagnosis not present

## 2016-11-23 DIAGNOSIS — H40013 Open angle with borderline findings, low risk, bilateral: Secondary | ICD-10-CM | POA: Diagnosis not present

## 2016-12-05 ENCOUNTER — Telehealth: Payer: Self-pay | Admitting: Nurse Practitioner

## 2016-12-05 NOTE — Telephone Encounter (Signed)
I spoke to pt, she has been having increased restless legs sx and would like to retry something to see if it would help.  I noted that she had GI intolerance issues when taking these drugs.  Even though, she stated she would like to try.  She mentioned mirapex? I did not see this as a tried drug before.  She wanted something that was ok to take with her other drugs.  Please advise.

## 2016-12-05 NOTE — Telephone Encounter (Signed)
Ferritin level is 190 on July 20 2016.  She has tried and failed different medications in the past for lower extremity pain, restless,   Options are  Gabapentin 300mg  1-2 tabs qhs, lyrica 100mg  1-2 tabs qhs, requip, or mirapex qhs.

## 2016-12-05 NOTE — Telephone Encounter (Signed)
Pt said she has not taken anything for restless leg left > rt over the past years. She said it is getting worse. She has tried requip in the past and is wanting to try it again. Pt said also she takes flexeril, will that interfer with requip. If not she neds a refill sent to [facility]. Please call to discuss

## 2016-12-05 NOTE — Telephone Encounter (Signed)
I spoke to pt and asked her about the  Iron supplement that she takes.  She is monitored for her ferritin and iron studies with Dr. Tiburcio Pea and she has had a infusion in the past.  She would like to see if medication for RL would help her mainly at night.  It looks like she has been on several previously. (see note per 2014 Dr. Frances Furbish)  (requip, neupro, she thought even mirapex).  Please advise.

## 2016-12-05 NOTE — Telephone Encounter (Signed)
Patient may want to check with primary care to get ferritin and iron studies done. I see that she is on iron supplements. I do not see restless legs on  problem list and if it is due to low ferritin level Mirapex or other drugs for restless legs would not be appropriate treatment

## 2016-12-06 MED ORDER — PRAMIPEXOLE DIHYDROCHLORIDE 0.25 MG PO TABS: 0.5000 mg | ORAL_TABLET | Freq: Every day | ORAL | 11 refills | Status: DC

## 2016-12-06 NOTE — Telephone Encounter (Signed)
Spoke to patient - she is aware the prescription has been sent to pharmacy.

## 2016-12-06 NOTE — Telephone Encounter (Signed)
This pt would like to try  A trial of mirapex, if ok with hr other medications that she is taking.

## 2016-12-06 NOTE — Addendum Note (Signed)
Addended by: Levert Feinstein on: 12/06/2016 11:17 AM   Modules accepted: Orders

## 2016-12-06 NOTE — Telephone Encounter (Signed)
Please call patient,   Mirapex 0.25mg  po qhs xone week, then 2 tabs po qhs, [facility]. It is compatible with other medications

## 2016-12-09 ENCOUNTER — Other Ambulatory Visit (INDEPENDENT_AMBULATORY_CARE_PROVIDER_SITE_OTHER): Payer: PPO

## 2016-12-09 DIAGNOSIS — R110 Nausea: Secondary | ICD-10-CM | POA: Diagnosis not present

## 2016-12-09 DIAGNOSIS — M797 Fibromyalgia: Secondary | ICD-10-CM | POA: Diagnosis not present

## 2016-12-09 DIAGNOSIS — N183 Chronic kidney disease, stage 3 (moderate): Secondary | ICD-10-CM | POA: Diagnosis not present

## 2016-12-09 DIAGNOSIS — G259 Extrapyramidal and movement disorder, unspecified: Secondary | ICD-10-CM | POA: Diagnosis not present

## 2016-12-09 DIAGNOSIS — J301 Allergic rhinitis due to pollen: Secondary | ICD-10-CM | POA: Diagnosis not present

## 2016-12-09 DIAGNOSIS — E2740 Unspecified adrenocortical insufficiency: Secondary | ICD-10-CM | POA: Diagnosis not present

## 2016-12-09 DIAGNOSIS — D508 Other iron deficiency anemias: Secondary | ICD-10-CM | POA: Diagnosis not present

## 2016-12-09 DIAGNOSIS — G4700 Insomnia, unspecified: Secondary | ICD-10-CM | POA: Diagnosis not present

## 2016-12-09 DIAGNOSIS — Z23 Encounter for immunization: Secondary | ICD-10-CM | POA: Diagnosis not present

## 2016-12-09 LAB — BASIC METABOLIC PANEL
BUN: 18 mg/dL (ref 6–23)
CALCIUM: 11.1 mg/dL — AB (ref 8.4–10.5)
CHLORIDE: 101 meq/L (ref 96–112)
CO2: 30 meq/L (ref 19–32)
Creatinine, Ser: 1.08 mg/dL (ref 0.40–1.20)
GFR: 53.43 mL/min — ABNORMAL LOW (ref >60.00)
Glucose, Bld: 114 mg/dL — ABNORMAL HIGH (ref 70–99)
Potassium: 4.9 mEq/L (ref 3.5–5.1)
SODIUM: 136 meq/L (ref 135–145)

## 2016-12-12 ENCOUNTER — Ambulatory Visit: Payer: PPO | Admitting: Endocrinology

## 2016-12-12 ENCOUNTER — Encounter: Payer: Self-pay | Admitting: Endocrinology

## 2016-12-12 VITALS — BP 136/72 | HR 92 | Ht 60.0 in | Wt 103.4 lb

## 2016-12-12 DIAGNOSIS — G903 Multi-system degeneration of the autonomic nervous system: Secondary | ICD-10-CM

## 2016-12-12 DIAGNOSIS — I951 Orthostatic hypotension: Secondary | ICD-10-CM

## 2016-12-12 DIAGNOSIS — E2749 Other adrenocortical insufficiency: Secondary | ICD-10-CM | POA: Diagnosis not present

## 2016-12-12 NOTE — Patient Instructions (Signed)
Reduce potassium to 2 daily  Check BP weekly

## 2016-12-12 NOTE — Progress Notes (Signed)
Patient ID: Katrina Rivera, female   DOB: [birth year], 71 y.o.   MRN: [id number]   Subjective:         Chief complaint: Follow-up of various issues   PROBLEM 1: Dysautonomic orthostatic hypotension   PAST history: She has had long-standing problems with orthostatic hypotension and also hyponatremia. Has been diagnosed with dysautonomia and has multiple other problems related to autonomic neuropathy.   She has been on Florinef since 2004 previously taking 3 tablets daily along with 5 tablets of potassium which had previously controlled her symptoms well . Because of persistent orthostatic symptoms she had been tried on midodrine on 05/28/12 but this was later stopped when blood pressure increased.  She  had medication adjustments done frequently over the last year for regulating her blood pressure.  RECENT history:   She has  been on variable doses of Florinef, as much as 3 a day, she is now taking 2 tablets daily  The dose was not changed on her last visit More recently she has not checked her blood pressure didn't related that despite reminders to keep a check on it regularly She feels a little dizzy periodically with not necessarily lightheaded Her PCP checked her blood pressure last week and this was normal setting     Hypokalemia: Controlled on supplements which she has been on for several years. She is usually taking 3 tablets of potassium Her potassium is higher than usual at 4.9    Lab Results  Component Value Date   CREATININE 1.08 12/09/2016   BUN 18 12/09/2016   NA 136 12/09/2016   K 4.9 12/09/2016   CL 101 12/09/2016   CO2 30 12/09/2016    Lab Results  Component Value Date   CALCIUM 11.1 (H) 12/09/2016   PHOS 1.9 (L) 07/23/2006    Adrenal Insufficiency:   This is secondary to pituitary dysfunction  Prior testing included Cortrosyn test showing stimulated level of 15.7, baseline 3.9.  Also confirmed by 24 hour urine free cortisol which was only  3.0.  Although previously she had tolerated oral hydrocortisone and small doses with improvement in her symptoms she did not continue this long-term She was again symptomatic with weight loss, decreased appetite, nausea and also diarrhea Cortrosyn stimulation test on 03/12/14 showed baseline cortisol level of 0.3 and post injection of 1.1 only  She had GI side effects from hydrocortisone and prednisone and was given hydrocortisone injections using insulin syringe   RECENT HISTORY:  She is on methylprednisolone, previously was on hydrocortisone injections because of GI intolerance with oral supplements    She is taking 4 mg at breakfast and 2 mg at dinnertime, asking about taking it at lunchtime instead of dinnertime She usually drinking boost in the morning and not eating a meal  Although on her last visit she was told to temporarily take injectable hydrocortisone because of severe nausea she discontinued this a week after when she had improvement in nausea She thinks some of her nausea is related to medications and her appetite is reasonably good now  Wt Readings from Last 3 Encounters:  12/12/16 103 lb 6.4 oz (46.9 kg)  10/26/16 102 lb 12.8 oz (46.6 kg)  10/12/16 101 lb (45.8 kg)    General Endocrinology:   HYPERCALCEMIA:  She has had transient hypercalcemia usually associated with renal dysfunction Her last PTH level was 31 However her calcium is again high at 11.1 with low phosphorus She does have osteoporosis as of 2016, see review of  systems  Lab Results  Component Value Date   PTH 31 03/01/2016   CALCIUM 11.1 (H) 12/09/2016   PHOS 1.9 (L) 07/23/2006     ?  Secondary hypothyroidism   She has had a low normal free T4 in the past  but did not tolerate thyroxine supplementation in the past which were tried because of her continued symptoms of fatigue Free T4 has been  consistently normal subsequently now   Has not had any abnormalities of the pituitary on previous MRI  of the brain  Lab Results  Component Value Date   TSH 2.40 08/15/2016   TSH 0.72 03/29/2016   TSH 1.67 10/08/2015   FREET4 0.89 08/15/2016   FREET4 0.74 03/29/2016   FREET4 0.74 10/08/2015      Allergies as of 12/12/2016   No Known Allergies     Medication List        Accurate as of 12/12/16  2:01 PM. Always use your most recent med list.          alfuzosin 10 MG 24 hr tablet Commonly known as:  UROXATRAL Take 10 mg by mouth daily with breakfast.   CALCIUM 600-D 600-400 MG-UNIT Tabs Generic drug:  Calcium Carbonate-Vitamin D3 Take 1 tablet by mouth daily.   cephALEXin 250 MG capsule Commonly known as:  KEFLEX 250 mg at bedtime.   cyclobenzaprine 10 MG tablet Commonly known as:  FLEXERIL Take 10 mg by mouth 2 (two) times daily as needed for muscle spasms.   Ferrous Sulfate Dried 200 (65 Fe) MG Tabs Take 65 mg by mouth daily.   fludrocortisone 0.1 MG tablet Commonly known as:  FLORINEF Take 0.2 mg by mouth daily.   magnesium oxide 400 MG tablet Commonly known as:  MAG-OX Take 400 mg by mouth 2 (two) times daily.   Melatonin 10 MG Tabs Take 10 mg by mouth at bedtime.   methylPREDNISolone 4 MG tablet Commonly known as:  MEDROL Take 2-4 mg by mouth 2 (two) times daily. Takes 4mg  ( 1 tablet) in morning and 2 mg (1/2 tablet) at 5pm   multivitamin tablet Take 1 tablet by mouth daily.   potassium chloride 10 MEQ tablet Commonly known as:  K-DUR TAKE 2 TABLETS EACH MORNING AND 2 TABLETS AT BEDTIME.   pramipexole 0.25 MG tablet Commonly known as:  MIRAPEX Take 2 tablets (0.5 mg total) by mouth at bedtime.   promethazine 25 MG tablet Commonly known as:  PHENERGAN Take 25 mg by mouth every 6 (six) hours as needed for nausea or vomiting.   STRESS B COMPLEX PO Take 1 tablet by mouth daily.   SYSTANE OP Apply 2 drops to eye daily as needed (for dry eyes). For both eyes   traZODone 100 MG tablet Commonly known as:  DESYREL Take 200 mg by mouth at  bedtime.   TYLENOL 8 HOUR ARTHRITIS PAIN PO Take 1-2 tablets by mouth daily.   VITAMIN D3 SUPER STRENGTH 2000 units Caps Generic drug:  Cholecalciferol Take 2,000 Units by mouth daily.       Review of Systems    ANEMIA:  Previously had been given iron infusion by her PCP, this was given on 03/21/16  Genitourinary: She has had difficulty emptying her bladder and is being treated by urologist with various medications  OSTEOPOROSIS: This is now being treated by her orthopedic surgeon with Prolia Last bone density in 2016 showed the following: AP LUMBAR SPINE L1 through L4  Bone Mineral Density (BMD):  0.787 g/cm2  Young Adult T-Score:  -2.4  LEFT FEMUR NECK  Bone Mineral Density (BMD):  0.503 g/cm2  Young Adult T-Score: -3.1   Objective:   Physical Exam  BP 136/72 (BP Location: Left Arm, Cuff Size: Normal)   Pulse 92   Ht 5' (1.524 m)   Wt 103 lb 6.4 oz (46.9 kg)   SpO2 99%   BMI 20.19 kg/m   Blood pressure Repeated standing = 140/68   Assessment:       Adrenal insufficiency:  She has long-standing secondary adrenal insufficiency as diagnosed by her symptoms and very low cortisol levels on the stimulation test  She is not subjectively doing very well and has minimal nausea, anorexia and no weight loss Also tolerating methylprednisolone orally now consistently Discussed timing of taking his medication at breakfast and suppertime with food, her evening meal is about 5 PM which was fine Discussed that if she has any significant nausea or vomiting she may need to take Solu-Cortef injections temporarily again   Orthostatic dysautonomic hypotension syndrome:   Her blood pressure is fairly good today in the office although on the higher side She will continue 2 tablets of Florinef  HYPOKALEMIA history: Since her potassium is 4.9 with 3 tablets she may not need to continue high-dose  HYPERCALCEMIA: This is now present even without renal dysfunction and  associated with low phosphorus Most likely has mild hyperparathyroidism Osteoporosis is probably unrelated but may be partly caused by the hyperparathyroidism now  Osteoporosis: Has not had a recent bone density test from her orthopedic surgeon  Plan:        She will reduced potassium to 3 tablets She will discuss getting another bone density with orthopedic surgeon Discussed that if her calcium is over 11.50  again she may need parathyroid surgery, discussed implications of this and how this would be done, discussed parathyroid function and role in osteoporosis Follow-up in 2 months Check blood pressure regularly at home including standing  Patient Instructions  Reduce potassium to 2 daily  Check BP weekly  . Total visit time for evaluation and management of multiple problems = 25 minutes  Amad Mau  Note: This office note was prepared with Insurance underwriter. Any transcriptional errors that result from this process are unintentional.

## 2016-12-13 ENCOUNTER — Other Ambulatory Visit: Payer: Self-pay | Admitting: Sports Medicine

## 2016-12-13 DIAGNOSIS — M8580 Other specified disorders of bone density and structure, unspecified site: Secondary | ICD-10-CM

## 2016-12-16 ENCOUNTER — Ambulatory Visit
Admission: RE | Admit: 2016-12-16 | Discharge: 2016-12-16 | Disposition: A | Discharge status: Home / Self Care | Payer: PPO | Source: Ambulatory Visit | Attending: Sports Medicine | Admitting: Sports Medicine

## 2016-12-16 DIAGNOSIS — M8580 Other specified disorders of bone density and structure, unspecified site: Secondary | ICD-10-CM

## 2016-12-16 DIAGNOSIS — M810 Age-related osteoporosis without current pathological fracture: Secondary | ICD-10-CM | POA: Diagnosis not present

## 2016-12-16 DIAGNOSIS — Z780 Asymptomatic menopausal state: Secondary | ICD-10-CM | POA: Diagnosis not present

## 2017-01-02 ENCOUNTER — Other Ambulatory Visit: Payer: Self-pay | Admitting: Endocrinology

## 2017-01-27 DIAGNOSIS — N318 Other neuromuscular dysfunction of bladder: Secondary | ICD-10-CM | POA: Diagnosis not present

## 2017-02-06 ENCOUNTER — Other Ambulatory Visit: Payer: PPO

## 2017-02-06 DIAGNOSIS — E2749 Other adrenocortical insufficiency: Secondary | ICD-10-CM

## 2017-02-07 LAB — BASIC METABOLIC PANEL
BUN/Creatinine Ratio: 21 (ref 12–28)
BUN: 22 mg/dL (ref 8–27)
CALCIUM: 11.1 mg/dL — AB (ref 8.7–10.3)
CHLORIDE: 101 mmol/L (ref 96–106)
CO2: 23 mmol/L (ref 20–29)
Creatinine, Ser: 1.06 mg/dL — ABNORMAL HIGH (ref 0.57–1.00)
GFR calc non Af Amer: 54 mL/min/{1.73_m2} — ABNORMAL LOW (ref >59)
GFR, EST AFRICAN AMERICAN: 62 mL/min/{1.73_m2} (ref >59)
Glucose: 106 mg/dL — ABNORMAL HIGH (ref 65–99)
Potassium: 5 mmol/L (ref 3.5–5.2)
Sodium: 140 mmol/L (ref 134–144)

## 2017-02-07 LAB — VITAMIN D 25 HYDROXY (VIT D DEFICIENCY, FRACTURES): VIT D 25 HYDROXY: 73.6 ng/mL (ref 30.0–100.0)

## 2017-02-07 LAB — PARATHYROID HORMONE, INTACT (NO CA): PTH: 23 pg/mL (ref 15–65)

## 2017-02-10 ENCOUNTER — Ambulatory Visit (INDEPENDENT_AMBULATORY_CARE_PROVIDER_SITE_OTHER): Payer: PPO | Admitting: Endocrinology

## 2017-02-10 ENCOUNTER — Encounter: Payer: Self-pay | Admitting: Endocrinology

## 2017-02-10 DIAGNOSIS — R5383 Other fatigue: Secondary | ICD-10-CM

## 2017-02-10 DIAGNOSIS — D649 Anemia, unspecified: Secondary | ICD-10-CM

## 2017-02-10 DIAGNOSIS — R252 Cramp and spasm: Secondary | ICD-10-CM

## 2017-02-10 DIAGNOSIS — E2749 Other adrenocortical insufficiency: Secondary | ICD-10-CM

## 2017-02-10 LAB — CBC WITH DIFFERENTIAL/PLATELET
BASOS ABS: 0 10*3/uL (ref 0.0–0.1)
Basophils Relative: 0.3 % (ref 0.0–3.0)
EOS PCT: 0.2 % (ref 0.0–5.0)
Eosinophils Absolute: 0 10*3/uL (ref 0.0–0.7)
HCT: 36.5 % (ref 36.0–46.0)
HEMOGLOBIN: 11.9 g/dL — AB (ref 12.0–15.0)
LYMPHS ABS: 0.9 10*3/uL (ref 0.7–4.0)
Lymphocytes Relative: 10.4 % — ABNORMAL LOW (ref 12.0–46.0)
MCHC: 32.6 g/dL (ref 30.0–36.0)
MCV: 93.1 fl (ref 78.0–100.0)
MONO ABS: 0.5 10*3/uL (ref 0.1–1.0)
MONOS PCT: 5.7 % (ref 3.0–12.0)
NEUTROS PCT: 83.4 % — AB (ref 43.0–77.0)
Neutro Abs: 7.1 10*3/uL (ref 1.4–7.7)
Platelets: 284 10*3/uL (ref 150.0–400.0)
RBC: 3.93 Mil/uL (ref 3.87–5.11)
RDW: 13.4 % (ref 11.5–15.5)
WBC: 8.5 10*3/uL (ref 4.0–10.5)

## 2017-02-10 LAB — ALT: ALT: 18 U/L (ref 0–35)

## 2017-02-10 LAB — BASIC METABOLIC PANEL
BUN: 21 mg/dL (ref 6–23)
CHLORIDE: 101 meq/L (ref 96–112)
CO2: 28 meq/L (ref 19–32)
Calcium: 10.1 mg/dL (ref 8.4–10.5)
Creatinine, Ser: 0.92 mg/dL (ref 0.40–1.20)
GFR: 64.26 mL/min (ref >60.00)
Glucose, Bld: 106 mg/dL — ABNORMAL HIGH (ref 70–99)
POTASSIUM: 4.8 meq/L (ref 3.5–5.1)
Sodium: 136 mEq/L (ref 135–145)

## 2017-02-10 LAB — T4, FREE: Free T4: 0.83 ng/dL (ref 0.60–1.60)

## 2017-02-10 LAB — MAGNESIUM: Magnesium: 2.7 mg/dL — ABNORMAL HIGH (ref 1.5–2.5)

## 2017-02-10 LAB — IBC PANEL
Iron: 34 ug/dL — ABNORMAL LOW (ref 42–145)
Saturation Ratios: 7.1 % — ABNORMAL LOW (ref 20.0–50.0)
TRANSFERRIN: 344 mg/dL (ref 212.0–360.0)

## 2017-02-10 LAB — TSH: TSH: 1.3 u[IU]/mL (ref 0.35–4.50)

## 2017-02-10 LAB — VITAMIN D 25 HYDROXY (VIT D DEFICIENCY, FRACTURES): VITD: 85.74 ng/mL (ref 30.00–100.00)

## 2017-02-10 NOTE — Patient Instructions (Addendum)
Vitamin D: stop Rx  Take 2 potassium daily with food  Metclopramide 30 min before each meal

## 2017-02-10 NOTE — Progress Notes (Signed)
Patient ID: Katrina Rivera, female   DOB: 04/29/1945, 72 y.o.   MRN: [id number]   Subjective:         Chief complaint: Follow-up of various issues   PROBLEM 1: Dysautonomic orthostatic hypotension   PAST history: She has had long-standing problems with orthostatic hypotension and also hyponatremia. Has been diagnosed with dysautonomia and has multiple other problems related to autonomic neuropathy.   She has been on Florinef since 2004 previously taking 3 tablets daily along with 5 tablets of potassium which had previously controlled her symptoms well . Because of persistent orthostatic symptoms she had been tried on midodrine on 05/28/12 but this was later stopped when blood pressure increased.  She  had medication adjustments done frequently over the last year for regulating her blood pressure.  RECENT history:   She has  been on variable doses of Florinef, as much as 3 a day, she is currently taking 2 tablets daily  The dose was not changed on her last visit She has checked her blood pressure regularly and also on standing up and has somewhat variable readings but the lowest systolic reading has been 117 Also look consistently high readings She thinks she gets dizzy when she stands up but this is more of a feeling of getting off balance, only sometimes feels faint     Hypokalemia: Controlled on supplements which she has been on for several years. She is supposed to be taking 2 tablets daily, the dose was reduced on her last visit However since she was having some leg cramps she increased the dose on her own to 4 tablets daily now  Her potassium is higher than usual at 5    Lab Results  Component Value Date   CREATININE 1.06 (H) 02/06/2017   BUN 22 02/06/2017   NA 140 02/06/2017   K 5.0 02/06/2017   CL 101 02/06/2017   CO2 23 02/06/2017    Lab Results  Component Value Date   CALCIUM 11.1 (H) 02/06/2017   PHOS 1.9 (L) 07/23/2006    Adrenal  Insufficiency:   This is secondary to pituitary dysfunction  Prior testing included Cortrosyn test showing stimulated level of 15.7, baseline 3.9.  Also confirmed by 24 hour urine free cortisol which was only 3.0.  Although previously she had tolerated oral hydrocortisone and small doses with improvement in her symptoms she did not continue this long-term She was again symptomatic with weight loss, decreased appetite, nausea and also diarrhea Cortrosyn stimulation test on 03/12/14 showed baseline cortisol level of 0.3 and post injection of 1.1 only  She had GI side effects from hydrocortisone and prednisone and was given hydrocortisone injections using insulin syringe   RECENT HISTORY:  She is on methylprednisolone, previously was on hydrocortisone injections because of GI intolerance with oral supplements    She is taking 4 mg at breakfast and 2 mg at dinnertime She is quite compliant with taking these and does not think she has any GI irritation with this now  She usually drinking boost in the morning at least and not eating a meal, taking her methylprednisolone with her boost She has had some fatigue, lightheadedness and decreased appetite but no recent weight loss as above  NAUSEA:  Wt Readings from Last 3 Encounters:  02/10/17 103 lb (46.7 kg)  12/12/16 103 lb 6.4 oz (46.9 kg)  10/26/16 102 lb 12.8 oz (46.6 kg)    General Endocrinology:   HYPERCALCEMIA:  She has had no more persistent  hypercalcemia , previously only temporarily associated with renal dysfunction Her last PTH level was 31 and is not 23 However her calcium is again high at 11.1 with low phosphorus However recent vitamin D level is about 74, she is taking about 2000 units daily Not taking oral calcium  She does have osteoporosis as of 2016, taking Prolia   Lab Results  Component Value Date   PTH 23 02/06/2017   CALCIUM 11.1 (H) 02/06/2017   PHOS 1.9 (L) 07/23/2006     ?  Secondary hypothyroidism    She has had a low normal free T4 in the past  but did not tolerate thyroxine supplementation in the past which were tried because of her continued symptoms of fatigue Free T4 has been  consistently normal subsequently   For the last month or so she has been feeling more fatigue but also is having issues with insomnia   Has not had any abnormalities of the pituitary on previous MRI of the brain  Lab Results  Component Value Date   TSH 2.40 08/15/2016   TSH 0.72 03/29/2016   TSH 1.67 10/08/2015   FREET4 0.89 08/15/2016   FREET4 0.74 03/29/2016   FREET4 0.74 10/08/2015      Allergies as of 02/10/2017   No Known Allergies     Medication List        Accurate as of 02/10/17 11:01 AM. Always use your most recent med list.          alfuzosin 10 MG 24 hr tablet Commonly known as:  UROXATRAL Take 10 mg by mouth daily with breakfast.   CALCIUM 600-D 600-400 MG-UNIT Tabs Generic drug:  Calcium Carbonate-Vitamin D3 Take 1 tablet by mouth daily.   cephALEXin 250 MG capsule Commonly known as:  KEFLEX 250 mg at bedtime.   cyclobenzaprine 10 MG tablet Commonly known as:  FLEXERIL Take 10 mg by mouth 2 (two) times daily as needed for muscle spasms.   Ferrous Sulfate Dried 200 (65 Fe) MG Tabs Take 65 mg by mouth daily.   fludrocortisone 0.1 MG tablet Commonly known as:  FLORINEF Take 0.2 mg by mouth daily.   magnesium oxide 400 MG tablet Commonly known as:  MAG-OX Take 400 mg by mouth 2 (two) times daily.   Melatonin 10 MG Tabs Take 10 mg by mouth at bedtime.   methylPREDNISolone 4 MG tablet Commonly known as:  MEDROL TAKE 1/2 TABLET 3 TIMES DAILY WITH EACH MEAL.   multivitamin tablet Take 1 tablet by mouth daily.   potassium chloride 10 MEQ tablet Commonly known as:  K-DUR TAKE 2 TABLETS EACH MORNING AND 2 TABLETS AT BEDTIME.   pramipexole 0.25 MG tablet Commonly known as:  MIRAPEX Take 2 tablets (0.5 mg total) by mouth at bedtime.   promethazine 25 MG  tablet Commonly known as:  PHENERGAN Take 25 mg by mouth every 6 (six) hours as needed for nausea or vomiting.   STRESS B COMPLEX PO Take 1 tablet by mouth daily.   SYSTANE OP Apply 2 drops to eye daily as needed (for dry eyes). For both eyes   traZODone 100 MG tablet Commonly known as:  DESYREL Take 200 mg by mouth at bedtime.   TYLENOL 8 HOUR ARTHRITIS PAIN PO Take 1-2 tablets by mouth daily.   VITAMIN D3 SUPER STRENGTH 2000 units Caps Generic drug:  Cholecalciferol Take 2,000 Units by mouth daily.       Review of Systems    ANEMIA:  Previously had been  given iron infusion by her PCP, this was given on 03/21/16  Genitourinary: She has had difficulty emptying her bladder and is being treated by urologist with various medications  OSTEOPOROSIS: This is now being treated by her orthopedic surgeon with Prolia  Previous bone density in 2016 showed the following: AP LUMBAR SPINE L1 through L4 Young Adult T-Score:  -2.4  LEFT FEMUR NECK: Young Adult T-Score: -3.1  RECENT bone density in 12/2016 has T score of -3.3 at the dual femur neck right and -2.3 for the L1-L3     Objective:   Physical Exam  BP 124/80 (BP Location: Left Arm, Patient Position: Standing, Cuff Size: Normal)   Pulse (!) 105   Ht 5' (1.524 m)   Wt 103 lb (46.7 kg)   SpO2 96%   BMI 20.12 kg/m   Sitting blood pressure 138/84 No pallor present  No ankle edema present  Assessment:       Adrenal insufficiency:  She has long-standing secondary adrenal insufficiency as diagnosed by her symptoms and very low cortisol levels on the stimulation test  She is recently having problems with nausea, anorexia but no obvious weight loss She has no orthostatic hypotension, hyponatremia or other signs or symptoms of adrenal insufficiency most likely her symptoms are from other causes For now she will continue her replacement doses of methylprednisolone twice a day  Discussed that if she has any  significant nausea or vomiting she may need to take Solu-Cortef injections temporarily again   Orthostatic dysautonomic hypotension syndrome:   Her blood pressure is fairly good today in the office including standing blood pressure Blood pressure has been somewhat variable but not significantly out of range at home also She will continue 2 tablets of Florinef  NAUSEA/anorexia: Etiology is unclear and she needs to discuss this with PCP  FATIGUE: This is not explained by any endocrine causes, since she is not due to see her PCP until next month will reevaluate her anemia, liver functions and iron status  HYPOKALEMIA history: Since her potassium is 5.0 with recent dose of 4 tablets daily she was headed down to 2 tablets again She may also reduce her GI irritation with taking less potassium  HYPERCALCEMIA: This is now persistent even without renal dysfunction and associated with low phosphorus Most likely has mild hyperparathyroidism even though PTH level is only 21  Osteoporosis is possibly compounded by her hyperparathyroidism Difficult to assess whether she has worsening of her bone density since measurements at the femoral neck were not made on the same side on the last bone density, spine appears to be slightly better Considering her multiple medical problems will not recommend parathyroid surgery as yet  MUSCLE cramps: Recent muscle cramps, not clear if etiology, will need evaluation of magnesium level also  PERSISTENT nausea and anorexia: She needs to discuss with her just intolerances to see if she has gastroparesis and autonomic neuropathy and possibly a trial of full dose REGLAN  Plan:       As above Labs drawn today  Recommended that she STOP vitamin D because of relatively high levels and this may be partially contributing to hypercalcemia  Check blood pressure regularly at home including standing  There are no Patient Instructions on file for this visit. . Total visit  time for evaluation and management of multiple problems = 25 minutes  Aubrina Nieman  Note: This office note was prepared with Insurance underwriter. Any transcriptional errors that result from this process are  unintentional.  ADDENDUM: Hemoglobin is 11.9 with iron saturation 7.1%, magnesium level increased Labs forwarded to PCP She needs to make appointment with PCP for further evaluation

## 2017-02-11 ENCOUNTER — Encounter: Payer: Self-pay | Admitting: Endocrinology

## 2017-02-13 ENCOUNTER — Telehealth: Payer: Self-pay | Admitting: Nurse Practitioner

## 2017-02-13 NOTE — Telephone Encounter (Signed)
Patient calling to discuss discontinuing pramipexole (MIRAPEX) 0.25 MG tablet and cyclobenzaprine (FLEXERIL) 10 MG tablet. She is not sleeping, very nervous and cannot concentrate.

## 2017-02-13 NOTE — Telephone Encounter (Signed)
Patient has been on multiple medications and failed these.  I have no other suggestions

## 2017-02-13 NOTE — Telephone Encounter (Signed)
Patient ask you to send a copy of her blood work to Dr [name] office she has an appointment there tomorrow.

## 2017-02-14 DIAGNOSIS — K219 Gastro-esophageal reflux disease without esophagitis: Secondary | ICD-10-CM | POA: Diagnosis not present

## 2017-02-14 DIAGNOSIS — N183 Chronic kidney disease, stage 3 (moderate): Secondary | ICD-10-CM | POA: Diagnosis not present

## 2017-02-14 DIAGNOSIS — G4700 Insomnia, unspecified: Secondary | ICD-10-CM | POA: Diagnosis not present

## 2017-02-14 DIAGNOSIS — M797 Fibromyalgia: Secondary | ICD-10-CM | POA: Diagnosis not present

## 2017-02-14 DIAGNOSIS — D508 Other iron deficiency anemias: Secondary | ICD-10-CM | POA: Diagnosis not present

## 2017-02-14 DIAGNOSIS — E2740 Unspecified adrenocortical insufficiency: Secondary | ICD-10-CM | POA: Diagnosis not present

## 2017-02-14 DIAGNOSIS — G259 Extrapyramidal and movement disorder, unspecified: Secondary | ICD-10-CM | POA: Diagnosis not present

## 2017-02-14 DIAGNOSIS — J301 Allergic rhinitis due to pollen: Secondary | ICD-10-CM | POA: Diagnosis not present

## 2017-02-14 DIAGNOSIS — R630 Anorexia: Secondary | ICD-10-CM | POA: Diagnosis not present

## 2017-02-14 NOTE — Telephone Encounter (Signed)
Spoke with patient and discussed NP's reply. She stated she saw her PCP today due to RLS, lack of sleep , appetite issues. PCP discontinued Flexeril and prescribed Remeron. Patient stopped Mirapex on her own due to possible side effects.  She stated she will try Remeron and hope that it is helpful for RLS and sleep. She requested this RN thank Enid Skeens for her help. This RN advised she call in the future for any questions, problems. Patient verbalized understanding, appreciation.

## 2017-03-06 ENCOUNTER — Other Ambulatory Visit: Payer: Self-pay | Admitting: Endocrinology

## 2017-03-06 ENCOUNTER — Encounter: Payer: Self-pay | Admitting: *Deleted

## 2017-03-06 NOTE — Telephone Encounter (Signed)
Pt is back on pramipexole (MIRAPEX) 0.25 MG tablet as a result of pain that has come back. Pt would like a refill of pramipexole (MIRAPEX) 0.25 MG tablet [facility] - [place], [place] - [address] [phone] (Phone) [phone] (Fax)   Pt confirmed no changes to her insurance

## 2017-03-06 NOTE — Telephone Encounter (Addendum)
Called patient who stated she has been back on Mirapex for 2 weeks. She stated that she was doing worse off it. The side effects she though might be from Mirapex were difficulty concentrating, difficluty sleeping.  Her PCP discontinued meds that could have caused those issues. She is now taking Mirapex 0.25 mg, two at bedtime with good results. She last got refill 01/02/17 for 3 months. She will pick up last refill on 03/11/17, a 3 month refill. She is asking for additional refills. This RN advised will discuss with NP. Patient has FU in Sept. Advised her the NP may want to wait until patient is sure she is tolerating Mirapex well before giving additional refills. Patient agreed for this RN to reply to her via My Chart.  This RN just noted Dr Terrace Arabia gave enough refills of Mirapex to last until Nov 2019. Will advise patient vis My Chart.

## 2017-03-14 DIAGNOSIS — G4700 Insomnia, unspecified: Secondary | ICD-10-CM | POA: Diagnosis not present

## 2017-03-14 DIAGNOSIS — D508 Other iron deficiency anemias: Secondary | ICD-10-CM | POA: Diagnosis not present

## 2017-03-14 DIAGNOSIS — G8929 Other chronic pain: Secondary | ICD-10-CM | POA: Diagnosis not present

## 2017-03-14 DIAGNOSIS — F419 Anxiety disorder, unspecified: Secondary | ICD-10-CM | POA: Diagnosis not present

## 2017-03-14 DIAGNOSIS — G259 Extrapyramidal and movement disorder, unspecified: Secondary | ICD-10-CM | POA: Diagnosis not present

## 2017-03-14 DIAGNOSIS — J0101 Acute recurrent maxillary sinusitis: Secondary | ICD-10-CM | POA: Diagnosis not present

## 2017-03-14 DIAGNOSIS — G35 Multiple sclerosis: Secondary | ICD-10-CM | POA: Diagnosis not present

## 2017-03-14 DIAGNOSIS — F339 Major depressive disorder, recurrent, unspecified: Secondary | ICD-10-CM | POA: Diagnosis not present

## 2017-03-14 DIAGNOSIS — N183 Chronic kidney disease, stage 3 (moderate): Secondary | ICD-10-CM | POA: Diagnosis not present

## 2017-03-16 ENCOUNTER — Telehealth: Payer: Self-pay | Admitting: Neurology

## 2017-03-16 NOTE — Telephone Encounter (Signed)
Emailed pt thru mychart

## 2017-03-16 NOTE — Telephone Encounter (Signed)
Please call patient, it is okay to try higher dose of Mirapex, 0.25 mg 2 tablets every night

## 2017-03-30 DIAGNOSIS — E559 Vitamin D deficiency, unspecified: Secondary | ICD-10-CM | POA: Diagnosis not present

## 2017-03-30 DIAGNOSIS — M810 Age-related osteoporosis without current pathological fracture: Secondary | ICD-10-CM | POA: Diagnosis not present

## 2017-03-30 DIAGNOSIS — R5383 Other fatigue: Secondary | ICD-10-CM | POA: Diagnosis not present

## 2017-04-05 DIAGNOSIS — M810 Age-related osteoporosis without current pathological fracture: Secondary | ICD-10-CM | POA: Diagnosis not present

## 2017-04-14 ENCOUNTER — Other Ambulatory Visit: Payer: Self-pay | Admitting: Endocrinology

## 2017-04-25 DIAGNOSIS — J069 Acute upper respiratory infection, unspecified: Secondary | ICD-10-CM | POA: Diagnosis not present

## 2017-05-17 ENCOUNTER — Other Ambulatory Visit: Payer: PPO

## 2017-05-18 ENCOUNTER — Other Ambulatory Visit (INDEPENDENT_AMBULATORY_CARE_PROVIDER_SITE_OTHER): Payer: PPO

## 2017-05-18 LAB — BASIC METABOLIC PANEL
BUN: 24 mg/dL — ABNORMAL HIGH (ref 6–23)
CALCIUM: 9.6 mg/dL (ref 8.4–10.5)
CO2: 28 meq/L (ref 19–32)
CREATININE: 0.79 mg/dL (ref 0.40–1.20)
Chloride: 104 mEq/L (ref 96–112)
GFR: 76.55 mL/min (ref >60.00)
Glucose, Bld: 119 mg/dL — ABNORMAL HIGH (ref 70–99)
Potassium: 4.1 mEq/L (ref 3.5–5.1)
Sodium: 137 mEq/L (ref 135–145)

## 2017-05-21 NOTE — Progress Notes (Signed)
Patient ID: Katrina Rivera, female   DOB: 11-18-1945, 72 y.o.   MRN: [id number]   Subjective:         Chief complaint: Follow-up of various issues   PROBLEM 1: Dysautonomic orthostatic hypotension   PAST history: She has had long-standing problems with orthostatic hypotension and also hyponatremia. Has been diagnosed with dysautonomia and has multiple other problems related to autonomic neuropathy.   She has been on Florinef since 2004 previously taking 3 tablets daily along with 5 tablets of potassium which had previously controlled her symptoms well . Because of persistent orthostatic symptoms she had been tried on midodrine on 05/28/12 but this was later stopped when blood pressure increased.  She  had medication adjustments done frequently over the last year for regulating her blood pressure.  RECENT history:   She has  been on variable doses of Florinef long-term, as much as 3 a day, she is currently taking 2 tablets daily  She has not checked her blood pressure at all recently and after being on vacation for at least 2 weeks she has forgotten to check it as instructed before However she is not feeling dizzy when she gets up and walks more recently Renal function has been normal     Hypokalemia: Controlled on supplements which she has been on for several years. She is supposed to be taking 2 tablets daily since her last visit since her potassium was relatively higher and she was having GI problems at the same time Her potassium was still doing well   Lab Results  Component Value Date   CREATININE 0.79 05/18/2017   BUN 24 (H) 05/18/2017   NA 137 05/18/2017   K 4.1 05/18/2017   CL 104 05/18/2017   CO2 28 05/18/2017    Lab Results  Component Value Date   CALCIUM 9.6 05/18/2017   PHOS 1.9 (L) 07/23/2006    Adrenal Insufficiency:   This is secondary to pituitary dysfunction  Prior testing included Cortrosyn test showing stimulated level of 15.7, baseline  3.9.  Also confirmed by 24 hour urine free cortisol which was only 3.0.  Although previously she had tolerated oral hydrocortisone and small doses with improvement in her symptoms she did not continue this long-term She was again symptomatic with weight loss, decreased appetite, nausea and also diarrhea Cortrosyn stimulation test on 03/12/14 showed baseline cortisol level of 0.3 and post injection of 1.1 only  She had GI side effects from hydrocortisone and prednisone and was given hydrocortisone injections using insulin syringe   RECENT HISTORY:  She is on methylprednisolone, previously was on hydrocortisone injections because of GI intolerance with oral supplements    She is taking 4 mg at breakfast and 2 mg at dinnertime She is quite compliant with taking this regimen with food Does not have any nausea with these tablets Recently does not complain of feeling as tired and has a good appetite now   She usually drinking boost in the morning at least and not eating a meal   Wt Readings from Last 3 Encounters:  05/22/17 113 lb 3.2 oz (51.3 kg)  02/10/17 103 lb (46.7 kg)  12/12/16 103 lb 6.4 oz (46.9 kg)    General Endocrinology:   HYPERCALCEMIA:  She has had intermittent hypercalcemia , previously only temporarily associated with renal dysfunction Her last PTH level was 31 and subsequently 23  However her calcium is now back to normal  She was also told to cut back vitamin D supplement  since her last level is about 52, she is taking about 1000 units daily Not taking oral calcium  She does have osteoporosis as of 2016, taking Prolia from orthopedic surgeon   Lab Results  Component Value Date   PTH 23 02/06/2017   CALCIUM 9.6 05/18/2017   PHOS 1.9 (L) 07/23/2006     ?  Secondary hypothyroidism   She has had a low normal free T4 in the past  but did not tolerate thyroxine supplementation in the past which were tried because of her continued symptoms of fatigue Free T4 has  been  consistently normal subsequently    Has not had any abnormalities of the pituitary on previous MRI of the brain  Lab Results  Component Value Date   TSH 1.30 02/10/2017   TSH 2.40 08/15/2016   TSH 0.72 03/29/2016   FREET4 0.83 02/10/2017   FREET4 0.89 08/15/2016   FREET4 0.74 03/29/2016      Allergies as of 05/22/2017   No Known Allergies     Medication List        Accurate as of 05/22/17 10:48 AM. Always use your most recent med list.          alfuzosin 10 MG 24 hr tablet Commonly known as:  UROXATRAL Take 10 mg by mouth daily with breakfast.   cephALEXin 250 MG capsule Commonly known as:  KEFLEX 250 mg at bedtime.   Ferrous Sulfate Dried 200 (65 Fe) MG Tabs Take 65 mg by mouth daily.   fludrocortisone 0.1 MG tablet Commonly known as:  FLORINEF Take 0.2 mg by mouth daily.   Melatonin 10 MG Tabs Take 10 mg by mouth at bedtime.   methylPREDNISolone 4 MG tablet Commonly known as:  MEDROL 1 tablet with food at breakfast and half tablet at 5 PM daily   mirtazapine 15 MG tablet Commonly known as:  REMERON Take 15 mg by mouth at bedtime.   multivitamin tablet Take 1 tablet by mouth daily.   potassium chloride 10 MEQ tablet Commonly known as:  K-DUR TAKE 2 TABLETS EACH MORNING AND 2 TABLETS AT BEDTIME.   pramipexole 0.25 MG tablet Commonly known as:  MIRAPEX Take 2 tablets (0.5 mg total) by mouth at bedtime.   promethazine 25 MG tablet Commonly known as:  PHENERGAN Take 25 mg by mouth every 6 (six) hours as needed for nausea or vomiting.   SYSTANE OP Apply 2 drops to eye daily as needed (for dry eyes). For both eyes   traZODone 100 MG tablet Commonly known as:  DESYREL Take 200 mg by mouth at bedtime.   TYLENOL 8 HOUR ARTHRITIS PAIN PO Take 1-2 tablets by mouth daily.       Review of Systems    ANEMIA:  Previously had been given iron infusion by her PCP, this was given on 03/21/16  Genitourinary: She has had difficulty emptying her  bladder and is being treated by urologist with various medications  OSTEOPOROSIS: This is being treated by her orthopedic surgeon with Prolia  Previous bone density in 2016 showed the following: AP LUMBAR SPINE L1 through L4 Young Adult T-Score:  -2.4  LEFT FEMUR NECK: Young Adult T-Score: -3.1  RECENT bone density in 12/2016 has T score of -3.3 at the dual femur neck right and -2.3 for the L1-L3     Objective:   Physical Exam  BP (!) 160/80 (BP Location: Left Arm, Patient Position: Standing, Cuff Size: Normal)   Pulse 63   Ht 5' (1.524  m)   Wt 113 lb 3.2 oz (51.3 kg)   SpO2 93%   BMI 22.11 kg/m   Sitting blood pressure 138/84 No pallor present  No ankle edema present  Assessment:       Adrenal insufficiency:  She has long-standing secondary adrenal insufficiency as diagnosed by her symptoms and very low cortisol levels on the stimulation test Clinically doing well with physiological doses of Medrol   Orthostatic dysautonomic hypotension syndrome:   Her blood pressure is unusually high today checked 3 times with current regimen of 2 tablets of Florinef  HYPERCALCEMIA: Resolving and likely to be hyperparathyroidism  Plan:      Reduce Florinef to 1 tablet Check blood pressure regularly at home Call if blood pressure is unusually low She can cut down her potassium to 1 tablet also since her levels are good  Reather Littler  Note: This office note was prepared with Insurance underwriter. Any transcriptional errors that result from this process are unintentional.

## 2017-05-22 ENCOUNTER — Encounter: Payer: Self-pay | Admitting: Endocrinology

## 2017-05-22 ENCOUNTER — Ambulatory Visit: Payer: PPO | Admitting: Endocrinology

## 2017-05-22 VITALS — BP 160/80 | HR 63 | Ht 60.0 in | Wt 113.2 lb

## 2017-05-22 DIAGNOSIS — E2749 Other adrenocortical insufficiency: Secondary | ICD-10-CM

## 2017-05-22 NOTE — Patient Instructions (Signed)
Reduce Florinef to 1 and 1 potassium

## 2017-07-15 ENCOUNTER — Other Ambulatory Visit: Payer: Self-pay | Admitting: Endocrinology

## 2017-07-20 ENCOUNTER — Other Ambulatory Visit (INDEPENDENT_AMBULATORY_CARE_PROVIDER_SITE_OTHER): Payer: PPO

## 2017-07-20 DIAGNOSIS — E2749 Other adrenocortical insufficiency: Secondary | ICD-10-CM

## 2017-07-20 LAB — BASIC METABOLIC PANEL
BUN: 19 mg/dL (ref 6–23)
CO2: 27 meq/L (ref 19–32)
CREATININE: 0.92 mg/dL (ref 0.40–1.20)
Calcium: 9.8 mg/dL (ref 8.4–10.5)
Chloride: 102 mEq/L (ref 96–112)
GFR: 64.18 mL/min (ref >60.00)
Glucose, Bld: 94 mg/dL (ref 70–99)
POTASSIUM: 4.4 meq/L (ref 3.5–5.1)
Sodium: 136 mEq/L (ref 135–145)

## 2017-07-24 ENCOUNTER — Encounter: Payer: Self-pay | Admitting: Endocrinology

## 2017-07-24 ENCOUNTER — Ambulatory Visit: Payer: PPO | Admitting: Endocrinology

## 2017-07-24 VITALS — BP 130/77 | HR 75 | Ht 60.0 in | Wt 112.0 lb

## 2017-07-24 DIAGNOSIS — E2749 Other adrenocortical insufficiency: Secondary | ICD-10-CM

## 2017-07-24 NOTE — Progress Notes (Signed)
Patient ID: Katrina Rivera, female   DOB: 05/13/1945, 72 y.o.   MRN: [id number]   Subjective:         Chief complaint: Follow-up of various issues   PROBLEM 1: Dysautonomic orthostatic hypotension   PAST history: She has had long-standing problems with orthostatic hypotension and also hyponatremia. Has been diagnosed with dysautonomia and has multiple other problems related to autonomic neuropathy.   She has been on Florinef since 2004 previously taking 3 tablets daily along with 5 tablets of potassium which had previously controlled her symptoms well . Because of persistent orthostatic symptoms she had been tried on midodrine on 05/28/12 but this was later stopped when blood pressure increased.  She  had medication adjustments done frequently over the last year for regulating her blood pressure.  RECENT history:   She has  been on variable doses of Florinef long-term, as much as 3 a day, she is currently taking only 1 tablet daily since 05/2017 Previously has had a tendency to higher sitting readings with some orthostasis but also she is sometimes feels lightheaded even without documented low blood pressures She has checked blood sugars sporadically at home and systolic reading usually is in the 120s and low 130s standing up recently Systolic reading may be higher at times     Hypokalemia: Controlled on supplements which she has been on for several years.  Not taking only 1 tablet daily with good control   Lab Results  Component Value Date   CREATININE 0.92 07/20/2017   BUN 19 07/20/2017   NA 136 07/20/2017   K 4.4 07/20/2017   CL 102 07/20/2017   CO2 27 07/20/2017    Lab Results  Component Value Date   CALCIUM 9.8 07/20/2017   PHOS 1.9 (L) 07/23/2006    Adrenal Insufficiency:   This is secondary to pituitary dysfunction  Prior testing included Cortrosyn test showing stimulated level of 15.7, baseline 3.9.  Also confirmed by 24 hour urine free cortisol  which was only 3.0.  Although previously she had tolerated oral hydrocortisone and small doses with improvement in her symptoms she did not continue this long-term She was again symptomatic with weight loss, decreased appetite, nausea and also diarrhea Cortrosyn stimulation test on 03/12/14 showed baseline cortisol level of 0.3 and post injection of 1.1 only  She had GI side effects from hydrocortisone and prednisone and was given hydrocortisone injections using insulin syringe   RECENT HISTORY:  She is on methylprednisolone, previously was on hydrocortisone injections because of GI intolerance with oral supplements    She is taking 4 mg at breakfast and 2 mg at about 5 PM She is quite c consistent ompliant with taking this regimen with food Does not have any nausea with these tablets No increased fatigue or nausea  WEIGHT loss: This has leveled off  Wt Readings from Last 3 Encounters:  07/24/17 112 lb (50.8 kg)  05/22/17 113 lb 3.2 oz (51.3 kg)  02/10/17 103 lb (46.7 kg)    MUSCLE cramps: She is asking about muscle cramps which have been on and off for several years She previously had taken quinine tablets Symptoms may be more at night  currently not taking any treatment  General Endocrinology:   HYPERCALCEMIA:  She has had intermittent hypercalcemia , previously only temporarily associated with renal dysfunction Her last PTH level was 31 and subsequently 23  However her calcium is now back to normal  She was also told to cut back vitamin D  supplement since her last level is about 58, she is taking about 1000 units daily Not taking oral calcium Last vitamin D level was upper normal  She does have osteoporosis as of 2016, taking Prolia from orthopedic surgeon   Lab Results  Component Value Date   PTH 23 02/06/2017   CALCIUM 9.8 07/20/2017   PHOS 1.9 (L) 07/23/2006     ?  Secondary hypothyroidism   She has had a low normal free T4 in the past  but did not tolerate  thyroxine supplementation in the past which were tried because of her continued symptoms of fatigue Free T4 has been  consistently normal subsequently    Has not had any abnormalities of the pituitary on previous MRI of the brain  Lab Results  Component Value Date   TSH 1.30 02/10/2017   TSH 2.40 08/15/2016   TSH 0.72 03/29/2016   FREET4 0.83 02/10/2017   FREET4 0.89 08/15/2016   FREET4 0.74 03/29/2016      Allergies as of 07/24/2017   No Known Allergies     Medication List        Accurate as of 07/24/17 10:54 AM. Always use your most recent med list.          alfuzosin 10 MG 24 hr tablet Commonly known as:  UROXATRAL Take 10 mg by mouth daily with breakfast.   cephALEXin 250 MG capsule Commonly known as:  KEFLEX 250 mg at bedtime.   Ferrous Sulfate Dried 200 (65 Fe) MG Tabs Take 65 mg by mouth daily.   fludrocortisone 0.1 MG tablet Commonly known as:  FLORINEF Take 0.2 mg by mouth daily.   Melatonin 10 MG Tabs Take 10 mg by mouth at bedtime.   methylPREDNISolone 4 MG tablet Commonly known as:  MEDROL TAKE 1 TABLET WITH BREAKFAST AND 1/2 TABLET AT 5PM.   mirtazapine 15 MG tablet Commonly known as:  REMERON Take 15 mg by mouth at bedtime.   multivitamin tablet Take 1 tablet by mouth daily.   potassium chloride 10 MEQ tablet Commonly known as:  K-DUR TAKE 2 TABLETS EACH MORNING AND 2 TABLETS AT BEDTIME.   pramipexole 0.25 MG tablet Commonly known as:  MIRAPEX Take 2 tablets (0.5 mg total) by mouth at bedtime.   promethazine 25 MG tablet Commonly known as:  PHENERGAN Take 25 mg by mouth every 6 (six) hours as needed for nausea or vomiting.   SYSTANE OP Apply 2 drops to eye daily as needed (for dry eyes). For both eyes   traZODone 100 MG tablet Commonly known as:  DESYREL Take 200 mg by mouth at bedtime.   TYLENOL 8 HOUR ARTHRITIS PAIN PO Take 1-2 tablets by mouth daily.       Review of Systems    Genitourinary: She has had difficulty  emptying her bladder and is being treated by urologist with various medications  OSTEOPOROSIS: This is being treated by her orthopedic surgeon with Prolia  Previous bone density in 2016 showed the following: AP LUMBAR SPINE L1 through L4 Young Adult T-Score:  -2.4  LEFT FEMUR NECK: Young Adult T-Score: -3.1  Last bone density in 12/2016 has T score of -3.3 at the dual femur neck right and -2.3 for the L1-L3     Objective:   Physical Exam  BP 130/77 (BP Location: Left Arm, Patient Position: Standing, Cuff Size: Normal)   Pulse 75   Ht 5' (1.524 m)   Wt 112 lb (50.8 kg)   SpO2 96%  BMI 21.87 kg/m   She looks well No ankle edema present  Assessment:       Adrenal insufficiency:  She has long-standing secondary adrenal insufficiency as diagnosed by her symptoms and very low cortisol levels on the stimulation test Clinically doing well with physiological doses of Medrol   Orthostatic dysautonomic hypotension syndrome:   Her blood pressure is generally well controlled with no orthostasis to cause symptoms She may be having high normal readings sitting but since at home she can get readings in the 120s standing up will not change her Florinef as yet  Idiopathic muscle cramps  Plan:      No change in Florinef or potassium supplements She can try magnesium supplements twice daily for cramps No change in prednisolone  Reather Littler  Note: This office note was prepared with Insurance underwriter. Any transcriptional errors that result from this process are unintentional.

## 2017-07-24 NOTE — Patient Instructions (Signed)
Take Mg for cramps

## 2017-08-25 DIAGNOSIS — F5101 Primary insomnia: Secondary | ICD-10-CM | POA: Diagnosis not present

## 2017-08-25 DIAGNOSIS — E2740 Unspecified adrenocortical insufficiency: Secondary | ICD-10-CM | POA: Diagnosis not present

## 2017-08-25 DIAGNOSIS — N183 Chronic kidney disease, stage 3 (moderate): Secondary | ICD-10-CM | POA: Diagnosis not present

## 2017-08-25 DIAGNOSIS — Z1389 Encounter for screening for other disorder: Secondary | ICD-10-CM | POA: Diagnosis not present

## 2017-08-25 DIAGNOSIS — G35 Multiple sclerosis: Secondary | ICD-10-CM | POA: Diagnosis not present

## 2017-08-25 DIAGNOSIS — D500 Iron deficiency anemia secondary to blood loss (chronic): Secondary | ICD-10-CM | POA: Diagnosis not present

## 2017-08-25 DIAGNOSIS — G259 Extrapyramidal and movement disorder, unspecified: Secondary | ICD-10-CM | POA: Diagnosis not present

## 2017-09-11 DIAGNOSIS — N3000 Acute cystitis without hematuria: Secondary | ICD-10-CM | POA: Diagnosis not present

## 2017-09-11 DIAGNOSIS — R3914 Feeling of incomplete bladder emptying: Secondary | ICD-10-CM | POA: Diagnosis not present

## 2017-09-27 DIAGNOSIS — E559 Vitamin D deficiency, unspecified: Secondary | ICD-10-CM | POA: Diagnosis not present

## 2017-09-27 DIAGNOSIS — M810 Age-related osteoporosis without current pathological fracture: Secondary | ICD-10-CM | POA: Diagnosis not present

## 2017-09-27 DIAGNOSIS — R5383 Other fatigue: Secondary | ICD-10-CM | POA: Diagnosis not present

## 2017-10-04 DIAGNOSIS — E559 Vitamin D deficiency, unspecified: Secondary | ICD-10-CM | POA: Diagnosis not present

## 2017-10-04 DIAGNOSIS — M810 Age-related osteoporosis without current pathological fracture: Secondary | ICD-10-CM | POA: Diagnosis not present

## 2017-10-20 ENCOUNTER — Other Ambulatory Visit (INDEPENDENT_AMBULATORY_CARE_PROVIDER_SITE_OTHER): Payer: PPO

## 2017-10-20 DIAGNOSIS — E2749 Other adrenocortical insufficiency: Secondary | ICD-10-CM

## 2017-10-20 LAB — BASIC METABOLIC PANEL
BUN: 15 mg/dL (ref 6–23)
CALCIUM: 10.6 mg/dL — AB (ref 8.4–10.5)
CO2: 25 mEq/L (ref 19–32)
Chloride: 101 mEq/L (ref 96–112)
Creatinine, Ser: 1 mg/dL (ref 0.40–1.20)
GFR: 58.25 mL/min — AB (ref >60.00)
Glucose, Bld: 75 mg/dL (ref 70–99)
Potassium: 3.8 mEq/L (ref 3.5–5.1)
SODIUM: 139 meq/L (ref 135–145)

## 2017-10-24 ENCOUNTER — Encounter: Payer: Self-pay | Admitting: Endocrinology

## 2017-10-24 ENCOUNTER — Telehealth: Payer: Self-pay | Admitting: Endocrinology

## 2017-10-24 ENCOUNTER — Ambulatory Visit: Payer: PPO | Admitting: Endocrinology

## 2017-10-24 VITALS — BP 122/68 | HR 84 | Temp 98.4°F | Resp 16 | Ht 60.0 in | Wt 117.0 lb

## 2017-10-24 DIAGNOSIS — E2749 Other adrenocortical insufficiency: Secondary | ICD-10-CM | POA: Diagnosis not present

## 2017-10-24 NOTE — Telephone Encounter (Signed)
Pt is asking if we could send in a referral for her to see nutrition if you see fit for her to do so

## 2017-10-24 NOTE — Progress Notes (Signed)
Patient ID: Katrina Rivera, female   DOB: 1946-01-09, 72 y.o.   MRN: [id number]   Subjective:         Chief complaint: Follow-up of various issues   PROBLEM 1: Dysautonomic orthostatic hypotension   PAST history: She has had long-standing problems with orthostatic hypotension and also hyponatremia. Has been diagnosed with dysautonomia and has multiple other problems related to autonomic neuropathy.   She has been on Florinef since 2004 previously taking 3 tablets daily along with 5 tablets of potassium which had previously controlled her symptoms well . Because of persistent orthostatic symptoms she had been tried on midodrine on 05/28/12 but this was later stopped when blood pressure increased.  She  had medication adjustments done frequently over the last year for regulating her blood pressure.  RECENT history:   She has  been on variable doses of Florinef long-term, as much as 3 a day, she is currently taking only 1 tablet daily since 05/2017  Has normal systolic readings with some orthostatic change but also she is sometimes feels lightheaded even without documented low blood pressures She has checked blood sugars sporadically at home and systolic readings are fairly consistently in the 120s and low 130s sitting and may go down to about 110-120 standing up and only rarely as low as 107   PROBLEM 2: Hypokalemia: Controlled on supplements which she has been on for several years.  Now taking only 1 tablet daily with good control   Lab Results  Component Value Date   CREATININE 1.00 10/20/2017   BUN 15 10/20/2017   NA 139 10/20/2017   K 3.8 10/20/2017   CL 101 10/20/2017   CO2 25 10/20/2017    Lab Results  Component Value Date   CALCIUM 10.6 (H) 10/20/2017   PHOS 1.9 (L) 07/23/2006    Adrenal Insufficiency:   This is secondary to pituitary dysfunction  Prior testing included Cortrosyn test showing stimulated level of 15.7, baseline 3.9.  Also confirmed by  24 hour urine free cortisol which was only 3.0.  Although previously she had tolerated oral hydrocortisone and small doses with improvement in her symptoms she did not continue this long-term She was again symptomatic with weight loss, decreased appetite, nausea and also diarrhea Cortrosyn stimulation test on 03/12/14 showed baseline cortisol level of 0.3 and post injection of 1.1 only  She had GI side effects from hydrocortisone and prednisone and was given hydrocortisone injections using insulin syringe   RECENT HISTORY:  She is on methylprednisolone, previously was on hydrocortisone injections because of GI intolerance with oral supplements    She is taking 4 mg at breakfast and 2 mg at about 5 PM She is quite compliant with taking this regimen with some food Does not have any nausea, previously had more symptoms with taking hydrocortisone or prednisone  No increased fatigue her weight has come back up  WEIGHT loss: Resolved  Wt Readings from Last 3 Encounters:  10/24/17 117 lb (53.1 kg)  07/24/17 112 lb (50.8 kg)  05/22/17 113 lb 3.2 oz (51.3 kg)     General Endocrinology:   HYPERCALCEMIA:  She has had intermittent hypercalcemia , previously only temporarily associated with renal dysfunction Her PTH level was 31 previously and subsequently 23  However her calcium is again variable but now slightly higher again  She was also told to cut back vitamin D supplement since her last level is about 74, she is taking about 1000 units daily Not taking oral calcium  Last vitamin D level was upper normal  She does have osteoporosis as of 2016, taking Prolia from orthopedic surgeon   Lab Results  Component Value Date   PTH 23 02/06/2017   CALCIUM 10.6 (H) 10/20/2017   PHOS 1.9 (L) 07/23/2006     ?  Secondary hypothyroidism   She has had a low normal free T4 in the past  but did not tolerate thyroxine supplementation in the past which were tried because of her continued  symptoms of fatigue Free T4 has been  consistently normal subsequently    Has not had any abnormalities of the pituitary on previous MRI of the brain  Lab Results  Component Value Date   TSH 1.30 02/10/2017   TSH 2.40 08/15/2016   TSH 0.72 03/29/2016   FREET4 0.83 02/10/2017   FREET4 0.89 08/15/2016   FREET4 0.74 03/29/2016      Allergies as of 10/24/2017   No Known Allergies     Medication List        Accurate as of 10/24/17  1:01 PM. Always use your most recent med list.          alfuzosin 10 MG 24 hr tablet Commonly known as:  UROXATRAL Take 10 mg by mouth daily with breakfast.   cephALEXin 250 MG capsule Commonly known as:  KEFLEX 250 mg at bedtime.   Ferrous Sulfate Dried 200 (65 Fe) MG Tabs Take 65 mg by mouth daily.   fludrocortisone 0.1 MG tablet Commonly known as:  FLORINEF Take 0.2 mg by mouth daily.   Melatonin 10 MG Tabs Take 10 mg by mouth at bedtime.   methylPREDNISolone 4 MG tablet Commonly known as:  MEDROL TAKE 1 TABLET WITH BREAKFAST AND 1/2 TABLET AT 5PM.   mirtazapine 15 MG tablet Commonly known as:  REMERON Take 15 mg by mouth at bedtime.   multivitamin tablet Take 1 tablet by mouth daily.   potassium chloride 10 MEQ tablet Commonly known as:  K-DUR TAKE 2 TABLETS EACH MORNING AND 2 TABLETS AT BEDTIME.   pramipexole 0.25 MG tablet Commonly known as:  MIRAPEX Take 2 tablets (0.5 mg total) by mouth at bedtime.   promethazine 25 MG tablet Commonly known as:  PHENERGAN Take 25 mg by mouth every 6 (six) hours as needed for nausea or vomiting.   SYSTANE OP Apply 2 drops to eye daily as needed (for dry eyes). For both eyes   traZODone 100 MG tablet Commonly known as:  DESYREL Take 300 mg by mouth at bedtime.   TYLENOL 8 HOUR ARTHRITIS PAIN PO Take 1-2 tablets by mouth daily.       Review of Systems    Genitourinary: She has had difficulty emptying her bladder and is being treated by urologist with various  medications  OSTEOPOROSIS: This is being treated by her orthopedic surgeon with Prolia  Previous bone density in 2016 showed the following: AP LUMBAR SPINE L1 through L4 Young Adult T-Score:  -2.4 LEFT FEMUR NECK: Young Adult T-Score: -3.1  Last bone density in 12/2016 has T score of -3.3 at the dual femur neck right and -2.3 for the L1-L3  She is going to be seen by her gastroenterologist for recurrent diarrhea     Objective:   Physical Exam  BP 122/68   Pulse 84   Temp 98.4 F (36.9 C) (Oral)   Resp 16   Ht 5' (1.524 m)   Wt 117 lb (53.1 kg)   BMI 22.85 kg/m   Standing  blood pressure was similar to sitting blood pressure She looks well Has a few small purpuric spots on her forearm No cushingoid features of rounded face, plethora or supraclavicular fat pads  No ankle edema present  Assessment:       Adrenal insufficiency:  She has long-standing secondary adrenal insufficiency as diagnosed by her symptoms and very low cortisol levels on the stimulation test Clinically doing well with physiological doses of Medrol, 6 mg total per day Discussed that she is not having any cushingoid features and her weight gain is unlikely to be related from taking this   Orthostatic dysautonomic hypotension syndrome:   Her blood pressure is now consistently well controlled with no orthostasis documented She may be having up to a 20 mm drop in blood pressure with standing up at home but usually the standing blood pressure is 110 or above  Hypercalcemia: Etiology is unclear and has intermittent hypercalcemia which is mild  Plan:      No change in Florinef or potassium supplements She can take her Prolia here instead of going especially to the orthopedic doctor's office and she will think about it Recheck calcium on next visit   Reather Littler  Note: This office note was prepared with Insurance underwriter. Any transcriptional errors that result from this  process are unintentional.

## 2017-10-24 NOTE — Telephone Encounter (Signed)
Pt just stated that she talked with you at the last visit and it was stated that she does not eat very well. She was just wanting your opinion as to whether she should see a nutritionist

## 2017-10-24 NOTE — Telephone Encounter (Signed)
I do not see a potential diagnosis for referral

## 2017-10-25 NOTE — Telephone Encounter (Signed)
Probably not since she is gaining weight, she can discuss further with her PCP

## 2017-10-26 ENCOUNTER — Other Ambulatory Visit: Payer: Self-pay | Admitting: Gastroenterology

## 2017-10-26 ENCOUNTER — Ambulatory Visit
Admission: RE | Admit: 2017-10-26 | Discharge: 2017-10-26 | Disposition: A | Discharge status: Home / Self Care | Payer: PPO | Source: Ambulatory Visit | Attending: Gastroenterology | Admitting: Gastroenterology

## 2017-10-26 DIAGNOSIS — R197 Diarrhea, unspecified: Secondary | ICD-10-CM | POA: Diagnosis not present

## 2017-10-26 DIAGNOSIS — R198 Other specified symptoms and signs involving the digestive system and abdomen: Secondary | ICD-10-CM

## 2017-10-26 NOTE — Progress Notes (Signed)
[facility]  PATIENT: Katrina Rivera DOB: [birth year]   REASON FOR VISIT: follow-up for multiple sclerosis HISTORY FROM: Patient and husband [name]    HISTORY OF PRESENT ILLNESS: HISTORY YYMrs. Rivera is a very pleasant 72 year-old right-handed woman, with relapsing remitting multiple sclerosis, was treated with Betaseron 4 -5 years, but could not tolerate the side effect, now is not on any immunomodulation therapy. neuropathic pain of bilateral lower extremities.  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   She has baseline gait difficulty, she fell in May 2013, fractured both elbows and her left knee cap. She intermittently has been using a cane or walker.  She could not tolerate Requip because of nausea. She has been on gabapentin 300 mg 3 bid. She gained significant weight when on Lyrica, and therefore was switched back to gabapentin. She has orthostatic hypotension, and is on midodrine 2.5 mg and fludrocortisone 0.1 mg 1 in AM and 2 at night, and she is also on 20 mg of hydrocortisone.  She complains of bilateral lower extremity burning stinging sensation, getting worse when she sits still, difficulty falling into sleep, she has the urge to move because of bilateral extremity discomfort, has tried Requip, could not tolerate it because of GI side effects, Neuprol patch does not work either, she also tried Elavil, Cymbalta in the past, could not tolerate it due to side effect.   Jan 01/2014:YYShe came in  urgently for acute worsening of her generalized condition since her lumbar decompression surgery in January 24 2013 by Dr. Marikay Alar, prior to surgery, she suffered left-sided low back pain, radiating pain to bilateral lower extremity, left is much worse than her right side, there was left L5-S1 extraforaminal herniated nucleus pulposus with left L5 radiculopathy she had left L5-S1 extraforaminal microdiscectomy utilizing microscopic dissection.  She had overnight stay at the hospital the day after surgery, she fell when trying to get up using bathroom, landed on her left side, surgery did help her lower back pain, radiating pain to her left leg, but she complained of worsening bilateral lower extremity deep achy pain, worsening gait difficulty, she has not used walker for 3 years, now she began to use her walker, she felt her skin is so tight at bilateral lower extremity, left leg swelling, also has difficulty swallowing, blurred vision, worsening fatigue, difficulty concentration, she has not had MRI evaluation for her multiple sclerosis for many years, is not on any immunomodulation therapy   UPDATE Mar 01, 2014:YYShe came in with a list of complains, much worse compared to presurgical level. She complains of blurred vision, difficulty sleeping, difficulty concentrating, dizziness, worsening gait difficulty, The most bothersome symptoms are bilateral lower extremity pain, constant, knife cutting pain, left worse than right Left leg showed no DVT on doppler study. She had 15 LB weight gain over one month, more difficulty sleepy, body shaking, more difficulty sleepy.  She has tried fentanyl patch 50 mcg, Trileptal, Neurontin without helping her symptoms .She is going to be seen by her surgeon Dr. Yetta Barre in 3 days  We have reviewed MRI of the brain  cervical lumbar spine together, these were done at [facility] imaging in January 2015 MRI lumbar: L5-S1: disc bulging and facet hypertrophy, status post  microdiscectomy on the left, with expected post-surgical changes. Multi-level facet hypertrophy, with no spinal stenosis or foraminal narrowing.  MRI cervical: Possible small chronic demyelinating plaque at C6 on the right side. No abnormal enhancing lesions  MRI brain: Multiple supratentorial and 1 infratentorial chronic demyelinating plaques. No acute plaques. Mild cerebellar tonsillar ectopia there was no significant change in the brain lesions,   UPDATE 7/16/15YY Patient returns for follow up.Her biggest complaint today is restless legs. She has never tried Neupro. She has had multiple trials of medications for her chronic pain. She is currently taking fentanyl, Trileptal, Cymbalta, lidocaine gel  UPDATE Feb 27 2014:YYShe is not on any long-term immunomodulation therapy for her relapsing remitting multiple sclerosis, neurological deficit has been fairly stable, she has baseline gait difficulty, the most bothersome symptoms for her is low back pain, bilateral lower extremity deep achy pain, radiating pain from left lower back to her left leg,  She was recently found to have very low cortisol level, under endocrinologist Dr. Remus Blake care, UPDATE June 15 2016YY: AS patient symptoms. Return to work with restrictions due to dizziness several days as well as we have a job description. She initially presented She had lumbar decompression by Dr. Marikay Alar in Jun 18 2014, which did help her low back pain, but now she experienced worsening left lower extremity spasm, left calf spasm so hard, as if a knot was tied, difficulty walking, she was giving a steroid package, no help, She complained excessive weight gain with Lyrica, Neurontin did not help, UPDATE July 27th 2016:YY She came in with a list of complaints, continue complains of unbalanced, staggering, generalized weakness, numbness tingling at bilateral lower extremity, worsening at her left leg, frequent urination, difficulty concentrating,  difficulty with multitasking UPDATE Oct 22 2014:YY She had a syncope episode in October 08 2014, was taken to the emergency room, have reviewed ED record, blood pressure was 200/60s laboratory showed normal CBC, CMP, this happened in the setting of missing her hydrocortisone dose, because of nausea, lack of appetite, dehydration, she is taking hydrocortisone because of Addison's disease, now she is on hydrocortisone shots,She is back to her baseline now, mild gait difficulty, also complains of anxiety, constant bilateral lower chamber paresthesia, she wants to get off Cymbalta 30 mg daily, worry about the long-term side effect. She also complains of bilateral neck, shoulder or upper extremity itching,  UPDATE Apr 05 2016:YY She had a spinal stimulator placement by neurosurgeon Dr. Buck Mam in January third 2018, which has helped her low back pain,and leg pain.  She had sinus infection, was treated with Zpack and nausea,  She has iron infusion in Feb 2018,  She noticed that she has worsening gait abnormality,  She also has diffuse body achy pain. She has tried Flexeril without benefit, previously tried and failed gabapentin, Cymbalta, fentanyl patch,  CPK was normal 39 WBC showed hemoglobin of 10 point 8, which was mildly decreased normal free T4, TSH, CMP, creat 1.1,  normal B12, UPDATE 09/19/2018CM Ms. [place], 72 year old female returns for follow-up with history of relapsing remitting multiple sclerosis, chronic low back pain with spinal stimulator in place and aching muscles. In terms of her MS her most aggravating symptom is mild gait difficulty. No recent falls She has no longer on immunomodulation therapy. She had previously been on Betaseron. Her chronic low back pain has much  improved with spinal cord stimulator. She only rarely takes her Flexeril for her aching muscles not on a consistent basis. In July she was admitted to the hospital for about a week for bowel obstruction. She returns for  reevaluation UPDATE September 20, 2019CM Ms. Reaser, 72 year old female returns for follow-up with history of relapsing remitting multiple sclerosis chronic low back pain with spinal cord stimulator in place.  Her most aggravating symptom is her mild gait disturbance.  She continues to exercise she has not fallen she is no longer on immunomodulation therapy.  She does not use an assistive device.  She was on Betaseron in the past.  She also has restless legs and is on Mirapex.  Prior to her spinal's cord stimulator she had diffuse body achy pain.  Previously tried Flexeril gabapentin Cymbalta fentanyl patch without significant benefit.  Her spinal cord stimulator has much improved her low back pain.  Patient also has nocturia for which she takes Uroxatral.  Last MRI was in 2015.  Her spinal cord stimulator is not compatible with MRI.  She returns for reevaluation  REVIEW OF SYSTEMS: Full 14 system review of systems performed and notable only for those listed, all others are neg:  Constitutional: Fatigue  Cardiovascular: neg Ear/Nose/Throat: neg  Skin: neg Eyes: neg Respiratory: neg Gastroitestinal: neg Genitourinary frequency Hematology/Lymphatic: neg  Endocrine: neg Musculoskeletal:neg Allergy/Immunology: neg Neurological: Numbness, essential tremor Psychiatric: Anxiety Sleep : Insomnia   ALLERGIES: No Known Allergies  HOME MEDICATIONS: Outpatient Medications Prior to Visit  Medication Sig Dispense Refill  . Acetaminophen (TYLENOL 8 HOUR ARTHRITIS PAIN PO) Take 1-2 tablets by mouth daily.    Marland Kitchen alfuzosin (UROXATRAL) 10 MG 24 hr tablet Take 10 mg by mouth daily with breakfast.    . cephALEXin (KEFLEX) 250 MG capsule 250 mg at bedtime.    . Ferrous Sulfate Dried 200 (65 FE) MG TABS Take 65 mg by mouth daily.    . fludrocortisone (FLORINEF) 0.1 MG tablet Take 0.2 mg by mouth daily.    . Melatonin 10 MG TABS Take 10 mg by mouth at bedtime.    . methylPREDNISolone (MEDROL) 4 MG tablet  TAKE 1 TABLET WITH BREAKFAST AND 1/2 TABLET AT 5PM. 135 tablet 2  . mirtazapine (REMERON) 15 MG tablet Take 15 mg by mouth at bedtime.    . Multiple Vitamin (MULTIVITAMIN) tablet Take 1 tablet by mouth daily.    Bertram Gala Glycol-Propyl Glycol (SYSTANE OP) Apply 2 drops to eye daily as needed (for dry eyes). For both eyes    . potassium chloride (K-DUR) 10 MEQ tablet TAKE 2 TABLETS EACH MORNING AND 2 TABLETS AT BEDTIME. (Patient taking differently: Take 10 mEq by mouth every morning. ) 360 tablet 0  . pramipexole (MIRAPEX) 0.25 MG tablet Take 2 tablets (0.5 mg total) by mouth at bedtime. (Patient taking differently: Take 1.5 mg by mouth at bedtime. ) 60 tablet 11  . promethazine (PHENERGAN) 25 MG tablet Take 25 mg by mouth every 6 (six) hours as needed for nausea or vomiting.    . traZODone (DESYREL) 100 MG tablet Take 300 mg by mouth at bedtime.      No facility-administered medications prior to visit.     PAST MEDICAL HISTORY: Past Medical History:  Diagnosis Date  . Addison's disease (HCC)    takes Solu Cortef daily  . Anemia    takes Ferrous Sulfate daily  . Anxiety    takes Xanax nightly  . Arthritis   . Chronic back pain  stenosis  . Depression    takes Cymbalta daily  . Dizziness    if b/p drops   . Fibromyalgia   . Fibromyalgia   . GERD (gastroesophageal reflux disease)   . Headache(784.0)   . History of blood transfusion    no abnormal reaction noted  . History of bronchitis    many yrs ago   . Hypokalemia    takes Potassium daily  . Hypotension   . Hypotension    takes Florinef daily  . IBS (irritable bowel syndrome)    takes Librarian, academic daily  . Insomnia    takes Trazodone nightly  . Joint pain   . Multiple sclerosis (HCC)    doesn't take any meds  . Multiple sclerosis (HCC)   . Nocturia   . Osteoporosis   . Peripheral neuropathy   . Restless leg syndrome   . Seasonal allergies    takes Zyrtec daily;uses Flonase daily as needed  . Syncope   . Urinary  frequency    takes Flomax daily  . Weakness    numbness and tingling    PAST SURGICAL HISTORY: Past Surgical History:  Procedure Laterality Date  . ABDOMINAL HYSTERECTOMY  1988  . APPENDECTOMY  1988  . CESAREAN SECTION  1973/1977   x2  . CHOLECYSTECTOMY  1997  . COLECTOMY  1990  . COLONOSCOPY    . ESOPHAGOGASTRODUODENOSCOPY    . EYE SURGERY     bilateral - /w IOL  . FRACTURE SURGERY Right    rods and screws  . LUMBAR LAMINECTOMY/DECOMPRESSION MICRODISCECTOMY Left 01/24/2013   Procedure: LUMBAR FIVE TO SACRAL ONE LUMBAR LAMINECTOMY/DECOMPRESSION MICRODISCECTOMY 1 LEVEL;  Surgeon: Tia Alert, MD;  Location: [facility];  Service: Neurosurgery;  Laterality: Left;  Marland Kitchen MAXIMUM ACCESS (MAS)POSTERIOR LUMBAR INTERBODY FUSION (PLIF) 1 LEVEL N/A 06/18/2014   Procedure: MAXIMUM ACCESS SURGERY POSTERIOR LUMBAR INTERBODY FUSION LUMBAR FIVE TO SACRAL ONE ;  Surgeon: Tia Alert, MD;  Location: [facility];  Service: Neurosurgery;  Laterality: N/A;    FAMILY HISTORY: Family History  Problem Relation Age of Onset  . Hypertension Mother   . Stroke Mother   . Heart attack Father   . Tremor Brother     SOCIAL HISTORY: Social History   Socioeconomic History  . Marital status: Married    Spouse name: [name]  . Number of children: 2  . Years of education: 67  . Highest education level: Not on file  Occupational History    Employer: DISABLED    Comment: Disabled  Social Needs  . Financial resource strain: Not on file  . Food insecurity:    Worry: Not on file    Inability: Not on file  . Transportation needs:    Medical: Not on file    Non-medical: Not on file  Tobacco Use  . Smoking status: Never Smoker  . Smokeless tobacco: Never Used  Substance and Sexual Activity  . Alcohol use: No  . Drug use: No  . Sexual activity: Not on file  Lifestyle  . Physical activity:    Days per week: Not on file    Minutes per session: Not on file  . Stress: Not on file  Relationships  .  Social connections:    Talks on phone: Not on file    Gets together: Not on file    Attends religious service: Not on file    Active member of club or organization: Not on file    Attends meetings of  clubs or organizations: Not on file    Relationship status: Not on file  . Intimate partner violence:    Fear of current or ex partner: Not on file    Emotionally abused: Not on file    Physically abused: Not on file    Forced sexual activity: Not on file  Other Topics Concern  . Not on file  Social History Narrative   Pt lives at home with spouse. ([name])   Caffeine Use: 1 cups daily.   Right handed.   Disabled.   Education - high school   Patient has two adult children.     PHYSICAL EXAM  Vitals:   10/27/17 0909  BP: (!) 146/69  Pulse: 72  Weight: 116 lb 6.4 oz (52.8 kg)  Height: 5' (1.524 m)   Body mass index is 22.73 kg/m.  Generalized: Well developed, in no acute distress  Head: normocephalic and atraumatic,. Oropharynx benign  Neck: Supple,  Musculoskeletal: No deformity   Neurological examination   Mentation: Alert oriented to time, place, history taking. Attention span and concentration appropriate. Recent and remote memory intact.  Follows all commands speech and language fluent.   Cranial nerve II-XII: .Pupils were equal round reactive to light extraocular movements were full, visual field were full on confrontational test. Facial sensation and strength were normal. hearing was intact to finger rubbing bilaterally. Uvula tongue midline. head turning and shoulder shrug were normal and symmetric.Tongue protrusion into cheek strength was normal. Motor: Mild lower extremity spasticity no significant upper or lower weakness  Sensory: normal and symmetric to light touch, pinprick, and  Vibration, in the upper and lower extremities  Coordination: finger-nose-finger, heel-to-shin bilaterally, no dysmetria Reflexes: Symmetric upper and lower plantar responses were flexor  bilaterally. Gait and Station: Rising up from seated position without assistance, normal stance,  moderate stride, good arm swing, smooth turning, able to perform tiptoe, and heel walking without difficulty. Tandem gait is mildly unsteady. No assistive device  DIAGNOSTIC DATA (LABS, IMAGING, TESTING) - I reviewed patient records, labs, notes, testing and imaging myself where available.  Lab Results  Component Value Date   WBC 8.5 02/10/2017   HGB 11.9 (L) 02/10/2017   HCT 36.5 02/10/2017   MCV 93.1 02/10/2017   PLT 284.0 02/10/2017      Component Value Date/Time   NA 139 10/20/2017 1035   NA 140 02/06/2017 1126   K 3.8 10/20/2017 1035   CL 101 10/20/2017 1035   CO2 25 10/20/2017 1035   GLUCOSE 75 10/20/2017 1035   BUN 15 10/20/2017 1035   BUN 22 02/06/2017 1126   CREATININE 1.00 10/20/2017 1035   CREATININE 0.85 11/06/2013 0840   CALCIUM 10.6 (H) 10/20/2017 1035   PROT 5.9 (L) 09/07/2016 0333   ALBUMIN 3.4 (L) 09/07/2016 0333   AST 21 09/07/2016 0333   ALT 18 02/10/2017 1150   ALKPHOS 39 09/07/2016 0333   BILITOT 0.4 09/07/2016 0333   GFRNONAA 54 (L) 02/06/2017 1126   GFRAA 62 02/06/2017 1126    Lab Results  Component Value Date   VITAMINB12 1,190 (H) 03/10/2016   Lab Results  Component Value Date   TSH 1.30 02/10/2017      ASSESSMENT AND PLAN  72 y.o. year old female  has a past medical history of Addison's disease (HCC); Anemia; Anxiety; Arthritis; Chronic back pain; Depression; Insomnia; Joint pain; Multiple sclerosis (HCC);  chronic low back pain.    PLAN: Relapsing remitting multiple sclerosis not on immunomodulation therapy with most recent  stable findings on MRI in 2015 Back pain improved with spinal cord stimulator placement not MRI compatible Continue exercise program Continue healthy diet Follow-up yearly and when necessary Nilda Riggs, [facility], [facility], APRN  [facility] [address], [address] [place], [place] [address] [phone]

## 2017-10-26 NOTE — Telephone Encounter (Signed)
Pt is aware.  

## 2017-10-27 ENCOUNTER — Ambulatory Visit: Payer: PPO | Admitting: Nurse Practitioner

## 2017-10-27 ENCOUNTER — Encounter: Payer: Self-pay | Admitting: Nurse Practitioner

## 2017-10-27 VITALS — BP 146/69 | HR 72 | Ht 60.0 in | Wt 116.4 lb

## 2017-10-27 DIAGNOSIS — R269 Unspecified abnormalities of gait and mobility: Secondary | ICD-10-CM | POA: Diagnosis not present

## 2017-10-27 DIAGNOSIS — G35 Multiple sclerosis: Secondary | ICD-10-CM | POA: Diagnosis not present

## 2017-10-27 NOTE — Patient Instructions (Signed)
Relapsing remitting multiple sclerosis not on immunomodulation therapy with most recent stable findings on MRI in 2015 Back pain improved with spinal cord stimulator placement Continue exercise program Continue healthy diet Follow-up yearly and when necessary

## 2017-10-27 NOTE — Progress Notes (Signed)
I have reviewed and agreed above plan. 

## 2017-11-23 DIAGNOSIS — Z961 Presence of intraocular lens: Secondary | ICD-10-CM | POA: Diagnosis not present

## 2017-11-23 DIAGNOSIS — H35371 Puckering of macula, right eye: Secondary | ICD-10-CM | POA: Diagnosis not present

## 2017-11-23 DIAGNOSIS — H04123 Dry eye syndrome of bilateral lacrimal glands: Secondary | ICD-10-CM | POA: Diagnosis not present

## 2017-11-23 DIAGNOSIS — H40013 Open angle with borderline findings, low risk, bilateral: Secondary | ICD-10-CM | POA: Diagnosis not present

## 2017-11-27 DIAGNOSIS — E2740 Unspecified adrenocortical insufficiency: Secondary | ICD-10-CM | POA: Diagnosis not present

## 2017-11-27 DIAGNOSIS — N183 Chronic kidney disease, stage 3 (moderate): Secondary | ICD-10-CM | POA: Diagnosis not present

## 2017-11-27 DIAGNOSIS — Z23 Encounter for immunization: Secondary | ICD-10-CM | POA: Diagnosis not present

## 2017-11-27 DIAGNOSIS — K219 Gastro-esophageal reflux disease without esophagitis: Secondary | ICD-10-CM | POA: Diagnosis not present

## 2017-11-27 DIAGNOSIS — R110 Nausea: Secondary | ICD-10-CM | POA: Diagnosis not present

## 2017-11-27 DIAGNOSIS — I10 Essential (primary) hypertension: Secondary | ICD-10-CM | POA: Diagnosis not present

## 2017-11-27 DIAGNOSIS — G259 Extrapyramidal and movement disorder, unspecified: Secondary | ICD-10-CM | POA: Diagnosis not present

## 2017-11-27 DIAGNOSIS — D500 Iron deficiency anemia secondary to blood loss (chronic): Secondary | ICD-10-CM | POA: Diagnosis not present

## 2017-11-27 DIAGNOSIS — G8929 Other chronic pain: Secondary | ICD-10-CM | POA: Diagnosis not present

## 2017-11-27 DIAGNOSIS — G4700 Insomnia, unspecified: Secondary | ICD-10-CM | POA: Diagnosis not present

## 2017-11-27 DIAGNOSIS — M797 Fibromyalgia: Secondary | ICD-10-CM | POA: Diagnosis not present

## 2017-11-28 ENCOUNTER — Other Ambulatory Visit: Payer: Self-pay | Admitting: Endocrinology

## 2018-01-19 ENCOUNTER — Other Ambulatory Visit (INDEPENDENT_AMBULATORY_CARE_PROVIDER_SITE_OTHER): Payer: PPO

## 2018-01-19 LAB — COMPREHENSIVE METABOLIC PANEL
ALBUMIN: 4.5 g/dL (ref 3.5–5.2)
ALT: 19 U/L (ref 0–35)
AST: 24 U/L (ref 0–37)
Alkaline Phosphatase: 28 U/L — ABNORMAL LOW (ref 39–117)
BUN: 22 mg/dL (ref 6–23)
CHLORIDE: 102 meq/L (ref 96–112)
CO2: 28 meq/L (ref 19–32)
CREATININE: 1.05 mg/dL (ref 0.40–1.20)
Calcium: 10.5 mg/dL (ref 8.4–10.5)
GFR: 55.02 mL/min — ABNORMAL LOW (ref >60.00)
Glucose, Bld: 101 mg/dL — ABNORMAL HIGH (ref 70–99)
Potassium: 4.7 mEq/L (ref 3.5–5.1)
SODIUM: 138 meq/L (ref 135–145)
Total Bilirubin: 0.5 mg/dL (ref 0.2–1.2)
Total Protein: 6.8 g/dL (ref 6.0–8.3)

## 2018-01-19 LAB — VITAMIN D 25 HYDROXY (VIT D DEFICIENCY, FRACTURES): VITD: 86.66 ng/mL (ref 30.00–100.00)

## 2018-01-22 ENCOUNTER — Other Ambulatory Visit: Payer: Self-pay | Admitting: Endocrinology

## 2018-01-23 ENCOUNTER — Ambulatory Visit: Payer: PPO | Admitting: Endocrinology

## 2018-01-23 ENCOUNTER — Telehealth: Payer: Self-pay | Admitting: Endocrinology

## 2018-01-23 ENCOUNTER — Encounter: Payer: Self-pay | Admitting: Endocrinology

## 2018-01-23 VITALS — BP 136/74 | HR 75 | Ht 60.0 in | Wt 119.6 lb

## 2018-01-23 DIAGNOSIS — R5383 Other fatigue: Secondary | ICD-10-CM

## 2018-01-23 DIAGNOSIS — E2749 Other adrenocortical insufficiency: Secondary | ICD-10-CM | POA: Diagnosis not present

## 2018-01-23 DIAGNOSIS — N318 Other neuromuscular dysfunction of bladder: Secondary | ICD-10-CM | POA: Diagnosis not present

## 2018-01-23 MED ORDER — METHYLPREDNISOLONE 2 MG PO TABS: ORAL_TABLET | ORAL | 3 refills | Status: DC

## 2018-01-23 NOTE — Progress Notes (Signed)
Patient ID: Katrina Rivera, female   DOB: [birth year], 72 y.o.   MRN: [id number]   Subjective:         Chief complaint: Follow-up of various issues   PROBLEM 1: Dysautonomic orthostatic hypotension   PAST history: She has had long-standing problems with orthostatic hypotension and also hyponatremia. Has been diagnosed with dysautonomia and has multiple other problems related to autonomic neuropathy.   She has been on Florinef since 2004 previously taking 3 tablets daily along with 5 tablets of potassium which had previously controlled her symptoms well . Because of persistent orthostatic symptoms she had been tried on midodrine on 05/28/12 but this was later stopped when blood pressure increased.  She  had medication adjustments done frequently over the last year for regulating her blood pressure.  RECENT history:   She has  been on variable doses of Florinef long-term, as much as 3 a day, she is currently taking only 1 tablet daily since 05/2017  Recent blood pressure at home shows a range of 135 up to 153 systolic sitting and 110-132 standing She has not compared her home meter to the office reading No lightheadedness   PROBLEM 2: Hypokalemia: Controlled on supplements which she has been on for several years.  Currently taking 20 mEq   Lab Results  Component Value Date   CREATININE 1.05 01/19/2018   BUN 22 01/19/2018   NA 138 01/19/2018   K 4.7 01/19/2018   CL 102 01/19/2018   CO2 28 01/19/2018    Lab Results  Component Value Date   CALCIUM 10.5 01/19/2018   PHOS 1.9 (L) 07/23/2006    Adrenal Insufficiency:   This is secondary to pituitary dysfunction  Prior testing included Cortrosyn test showing stimulated level of 15.7, baseline 3.9.  Also confirmed by 24 hour urine free cortisol which was only 3.0.  Although previously she had tolerated oral hydrocortisone and small doses with improvement in her symptoms she did not continue this long-term She was  again symptomatic with weight loss, decreased appetite, nausea and also diarrhea Cortrosyn stimulation test on 03/12/14 showed baseline cortisol level of 0.3 and post injection of 1.1 only  She had GI side effects from hydrocortisone and prednisone and was given hydrocortisone injections using insulin syringe   RECENT HISTORY:  She is on methylprednisolone which is well-tolerated; previously was on hydrocortisone injections because of GI intolerance with oral supplements    She is taking 4 mg at breakfast and 2 mg at about 5 PM This is not causing nausea which was the difficulty with taking hydrocortisone or prednisone  Although she tends to have fatigue consistently this is not any different No significant nausea, decreased appetite or malaise.  Also her weight has improved  Sodium consistently normal  Wt Readings from Last 3 Encounters:  01/23/18 119 lb 9.6 oz (54.3 kg)  10/27/17 116 lb 6.4 oz (52.8 kg)  10/24/17 117 lb (53.1 kg)     General Endocrinology:   HYPERCALCEMIA:  She has had intermittent hypercalcemia , previously only temporarily associated with renal dysfunction Her PTH level was 31 previously and subsequently 23  However her calcium is again variable but now slightly higher again  Not taking oral calcium Last vitamin D level is upper normal She does not know how much vitamin D she is taking, she does take 2 capsules daily  She does have osteoporosis as of 2016, taking Prolia from orthopedic surgeon   Lab Results  Component Value Date  PTH 23 02/06/2017   CALCIUM 10.5 01/19/2018   PHOS 1.9 (L) 07/23/2006     ?  Secondary hypothyroidism   She has had a low normal free T4 in the past  but did not tolerate thyroxine supplementation in the past which were tried because of her continued symptoms of fatigue Free T4 has been  consistently normal subsequently    Has not had any abnormalities of the pituitary on previous MRI of the brain  Lab Results    Component Value Date   TSH 1.30 02/10/2017   TSH 2.40 08/15/2016   TSH 0.72 03/29/2016   FREET4 0.83 02/10/2017   FREET4 0.89 08/15/2016   FREET4 0.74 03/29/2016      Allergies as of 01/23/2018   No Known Allergies     Medication List       Accurate as of January 23, 2018 10:12 AM. Always use your most recent med list.        alfuzosin 10 MG 24 hr tablet Commonly known as:  UROXATRAL Take 10 mg by mouth daily with breakfast.   cephALEXin 250 MG capsule Commonly known as:  KEFLEX 250 mg at bedtime.   Ferrous Sulfate Dried 200 (65 Fe) MG Tabs Take 65 mg by mouth daily.   fludrocortisone 0.1 MG tablet Commonly known as:  FLORINEF TAKE 2 TABLETS IN THE PM.   Melatonin 10 MG Tabs Take 10 mg by mouth at bedtime.   methylPREDNISolone 4 MG tablet Commonly known as:  MEDROL TAKE 1 TABLET WITH BREAKFAST AND 1/2 TABLET AT 5PM.   mirtazapine 15 MG tablet Commonly known as:  REMERON Take 15 mg by mouth at bedtime.   multivitamin tablet Take 1 tablet by mouth daily.   potassium chloride 10 MEQ tablet Commonly known as:  K-DUR TAKE 2 TABLETS EACH MORNING AND 2 TABLETS AT BEDTIME.   pramipexole 0.25 MG tablet Commonly known as:  MIRAPEX Take 2 tablets (0.5 mg total) by mouth at bedtime.   promethazine 25 MG tablet Commonly known as:  PHENERGAN Take 25 mg by mouth every 6 (six) hours as needed for nausea or vomiting.   SYSTANE OP Apply 2 drops to eye daily as needed (for dry eyes). For both eyes   traZODone 100 MG tablet Commonly known as:  DESYREL Take 300 mg by mouth at bedtime.   TYLENOL 8 HOUR ARTHRITIS PAIN PO Take 1-2 tablets by mouth daily.       Review of Systems    Genitourinary: She has had difficulty emptying her bladder and is being treated by urologist with various medications  OSTEOPOROSIS: This is being treated by her orthopedic surgeon with Prolia  Previous bone density in 2016 showed the following: AP LUMBAR SPINE L1 through  L4 Young Adult T-Score:  -2.4 LEFT FEMUR NECK: Young Adult T-Score: -3.1  Last bone density in 12/2016 has T score of -3.3 at the dual femur neck right and -2.3 for the L1-L3       Objective:   Physical Exam  BP 136/74 (BP Location: Left Arm, Patient Position: Standing, Cuff Size: Normal)   Pulse 75   Ht 5' (1.524 m)   Wt 119 lb 9.6 oz (54.3 kg)   SpO2 97%   BMI 23.36 kg/m    She looks well No cushingoid changes of central obesity  Assessment:       Adrenal insufficiency:  She has long-standing secondary adrenal insufficiency as diagnosed by her symptoms and very low cortisol levels on the stimulation  test Currently on Medrol, 6 mg total per day She is concerned about her tendency to weight gain and is again asking about cutting back on her steroid supplement   Orthostatic dysautonomic hypotension syndrome:   Her blood pressure is consistently well controlled with no orthostatic symptoms or significant drop in blood pressure in the office   Hypercalcemia: Calcium is upper normal, she has intermittent hypercalcemia without evidence of hyperparathyroidism  However vitamin D level is upper normal   Plan:      We will give her a trial of 3 mg methylprednisolone in the morning instead of 4 mg using the 2 mg tablet However discussed that if she has any weakness, lethargy, nausea or malaise with this she will need to go back to the original dose  Continue monitoring blood pressure at home setting and standing  Vitamin D will be reduced to 1 capsule daily instead of 2  Eryck Negron  Note: This office note was prepared with Insurance underwriter. Any transcriptional errors that result from this process are unintentional.

## 2018-01-23 NOTE — Telephone Encounter (Signed)
Patient stated on her last OV MD told her to take 2 mg of methylprednisolone but when she went to pick up from the pharmacy and they gave her 4 mg tablets- the instructions read to take 3/4 of a pill at breakfast and 1/2 at 4 pm and this is too hard for her to do she is struggling to get the tablets cut and she wants to see if she can go to 2 mg tabs like she was told in OV-please advise

## 2018-01-23 NOTE — Telephone Encounter (Signed)
Pharmacy will have to order the 2 mg, this prescription was already sent

## 2018-01-23 NOTE — Patient Instructions (Signed)
Vitamin D, take 1 daily

## 2018-01-23 NOTE — Telephone Encounter (Signed)
Patient called and has questions regarding her prednisone Rx.  Per patient she was told that she would be taking 2 mg, but what she got from pharmacy she received 4 mg pills and instructions to take 3/4 of pill.  Doesn't understand dosage, how to get 3/4 of pill, and would like a call back at [phone].

## 2018-01-23 NOTE — Telephone Encounter (Signed)
LMTCB

## 2018-01-24 LAB — VITAMIN D 1,25 DIHYDROXY
VITAMIN D 1, 25 (OH) TOTAL: 23 pg/mL
Vitamin D2 1, 25 (OH)2: <10 pg/mL
Vitamin D3 1, 25 (OH)2: 23 pg/mL

## 2018-01-24 NOTE — Telephone Encounter (Signed)
Pharmacy will have 2 mg tablets tomorrow and patient informed

## 2018-01-24 NOTE — Telephone Encounter (Signed)
Spoke with patient and gave her the instructions for the 2 mg tablets and let her know I will call the pharmacy to [facility] sure they do not give 4 mg tablets

## 2018-02-26 ENCOUNTER — Telehealth: Payer: Self-pay | Admitting: Endocrinology

## 2018-02-26 NOTE — Telephone Encounter (Signed)
She can take 2 tablets of the 2 mg prednisolone in the morning and 1 tablet late afternoon.  Next prescription will be for 4 mg, 1 tablet in the morning and half in the evening as before

## 2018-02-26 NOTE — Telephone Encounter (Signed)
methylPREDNISolone (MEDROL) 2 MG tablet    Patient stated that she was told by Dr Lucianne Muss to take a smaller dose of this medication. She said that she can tell a difference and her side effects were fatigue, off balance and she is only sleeping 2 hours at night. She is not sure if this could have anything to do with the medication but would like to see if Dr Lucianne Muss could put her back on the dose she was taking before.    Please advise

## 2018-02-27 DIAGNOSIS — N183 Chronic kidney disease, stage 3 (moderate): Secondary | ICD-10-CM | POA: Diagnosis not present

## 2018-02-27 DIAGNOSIS — I10 Essential (primary) hypertension: Secondary | ICD-10-CM | POA: Diagnosis not present

## 2018-02-27 DIAGNOSIS — F5101 Primary insomnia: Secondary | ICD-10-CM | POA: Diagnosis not present

## 2018-02-27 DIAGNOSIS — E2740 Unspecified adrenocortical insufficiency: Secondary | ICD-10-CM | POA: Diagnosis not present

## 2018-02-27 DIAGNOSIS — M797 Fibromyalgia: Secondary | ICD-10-CM | POA: Diagnosis not present

## 2018-02-27 DIAGNOSIS — G35 Multiple sclerosis: Secondary | ICD-10-CM | POA: Diagnosis not present

## 2018-02-27 NOTE — Telephone Encounter (Signed)
Called pt and informed her of MD message. Pt verbalized understanding. 

## 2018-04-02 DIAGNOSIS — R197 Diarrhea, unspecified: Secondary | ICD-10-CM | POA: Diagnosis not present

## 2018-04-02 DIAGNOSIS — R51 Headache: Secondary | ICD-10-CM | POA: Diagnosis not present

## 2018-04-02 DIAGNOSIS — I10 Essential (primary) hypertension: Secondary | ICD-10-CM | POA: Diagnosis not present

## 2018-04-09 ENCOUNTER — Ambulatory Visit
Admission: RE | Admit: 2018-04-09 | Discharge: 2018-04-09 | Disposition: A | Discharge status: Home / Self Care | Payer: PPO | Source: Ambulatory Visit | Attending: Gastroenterology | Admitting: Gastroenterology

## 2018-04-09 ENCOUNTER — Other Ambulatory Visit: Payer: Self-pay | Admitting: Gastroenterology

## 2018-04-09 DIAGNOSIS — R933 Abnormal findings on diagnostic imaging of other parts of digestive tract: Secondary | ICD-10-CM | POA: Diagnosis not present

## 2018-04-09 DIAGNOSIS — R198 Other specified symptoms and signs involving the digestive system and abdomen: Secondary | ICD-10-CM

## 2018-04-09 DIAGNOSIS — R159 Full incontinence of feces: Secondary | ICD-10-CM | POA: Diagnosis not present

## 2018-04-09 DIAGNOSIS — K5900 Constipation, unspecified: Secondary | ICD-10-CM | POA: Diagnosis not present

## 2018-04-11 DIAGNOSIS — R194 Change in bowel habit: Secondary | ICD-10-CM | POA: Diagnosis not present

## 2018-04-11 DIAGNOSIS — R159 Full incontinence of feces: Secondary | ICD-10-CM | POA: Diagnosis not present

## 2018-04-11 DIAGNOSIS — Z980 Intestinal bypass and anastomosis status: Secondary | ICD-10-CM | POA: Diagnosis not present

## 2018-04-17 DIAGNOSIS — M810 Age-related osteoporosis without current pathological fracture: Secondary | ICD-10-CM | POA: Diagnosis not present

## 2018-04-17 DIAGNOSIS — E559 Vitamin D deficiency, unspecified: Secondary | ICD-10-CM | POA: Diagnosis not present

## 2018-04-20 ENCOUNTER — Other Ambulatory Visit: Payer: Self-pay

## 2018-04-20 ENCOUNTER — Other Ambulatory Visit (INDEPENDENT_AMBULATORY_CARE_PROVIDER_SITE_OTHER): Payer: PPO

## 2018-04-20 DIAGNOSIS — E2749 Other adrenocortical insufficiency: Secondary | ICD-10-CM | POA: Diagnosis not present

## 2018-04-20 LAB — COMPREHENSIVE METABOLIC PANEL
ALT: 17 U/L (ref 0–35)
AST: 23 U/L (ref 0–37)
Albumin: 4.5 g/dL (ref 3.5–5.2)
Alkaline Phosphatase: 32 U/L — ABNORMAL LOW (ref 39–117)
BUN: 23 mg/dL (ref 6–23)
CO2: 30 mEq/L (ref 19–32)
Calcium: 10.6 mg/dL — ABNORMAL HIGH (ref 8.4–10.5)
Chloride: 102 mEq/L (ref 96–112)
Creatinine, Ser: 1.03 mg/dL (ref 0.40–1.20)
GFR: 52.89 mL/min — ABNORMAL LOW (ref >60.00)
Glucose, Bld: 93 mg/dL (ref 70–99)
Potassium: 4.6 mEq/L (ref 3.5–5.1)
SODIUM: 137 meq/L (ref 135–145)
TOTAL PROTEIN: 6.8 g/dL (ref 6.0–8.3)
Total Bilirubin: 0.4 mg/dL (ref 0.2–1.2)

## 2018-04-24 ENCOUNTER — Other Ambulatory Visit: Payer: Self-pay

## 2018-04-24 ENCOUNTER — Encounter: Payer: Self-pay | Admitting: Endocrinology

## 2018-04-24 ENCOUNTER — Ambulatory Visit: Payer: PPO | Admitting: Endocrinology

## 2018-04-24 VITALS — BP 144/68 | HR 61 | Temp 97.6°F | Resp 12 | Ht 60.0 in | Wt 118.0 lb

## 2018-04-24 DIAGNOSIS — I951 Orthostatic hypotension: Secondary | ICD-10-CM

## 2018-04-24 DIAGNOSIS — G903 Multi-system degeneration of the autonomic nervous system: Secondary | ICD-10-CM | POA: Diagnosis not present

## 2018-04-24 DIAGNOSIS — E2749 Other adrenocortical insufficiency: Secondary | ICD-10-CM

## 2018-04-24 DIAGNOSIS — R5383 Other fatigue: Secondary | ICD-10-CM | POA: Diagnosis not present

## 2018-04-24 MED ORDER — PROMETHAZINE HCL 25 MG PO TABS: 25.0000 mg | ORAL_TABLET | Freq: Three times a day (TID) | ORAL | 0 refills | Status: DC | PRN

## 2018-04-24 NOTE — Patient Instructions (Signed)
Stop potassium.

## 2018-04-24 NOTE — Progress Notes (Signed)
Patient ID: Katrina Rivera, female   DOB: 04-27-1945, 73 y.o.   MRN: [id number]   Subjective:         Chief complaint: Follow-up of various issues   PROBLEM 1: Dysautonomic orthostatic hypotension   PAST history: She has had long-standing problems with orthostatic hypotension and also hyponatremia. Has been diagnosed with dysautonomia and has multiple other problems related to autonomic neuropathy.   She has been on Florinef since 2004 previously taking 3 tablets daily along with 5 tablets of potassium which had previously controlled her symptoms well . Because of persistent orthostatic symptoms she had been tried on midodrine on 05/28/12 but this was later stopped when blood pressure increased.  She  had medication adjustments done frequently over the last year for regulating her blood pressure.  RECENT history:   She has  been on variable doses of Florinef long-term, as much as 3 a day, she is currently taking only 1 tablet daily since 05/2017  She has not been checking blood pressure readings at home but has had occasional high readings at other offices No recent problems with dizziness on standing up   PROBLEM 2: Hypokalemia likely from Florinef: Controlled on supplements which she has been on for several years.  Currently taking 20 mEq but her potassium is relatively higher at 4.6   Lab Results  Component Value Date   CREATININE 1.03 04/20/2018   BUN 23 04/20/2018   NA 137 04/20/2018   K 4.6 04/20/2018   CL 102 04/20/2018   CO2 30 04/20/2018     Adrenal Insufficiency:   This is secondary to pituitary dysfunction  Prior testing included Cortrosyn test showing stimulated level of 15.7, baseline 3.9.  Also confirmed by 24 hour urine free cortisol which was only 3.0.  Although previously she had tolerated oral hydrocortisone and small doses with improvement in her symptoms she did not continue this long-term She was again symptomatic with weight loss,  decreased appetite, nausea and also diarrhea Cortrosyn stimulation test on 03/12/14 showed baseline cortisol level of 0.3 and post injection of 1.1 only  She had GI side effects from hydrocortisone and prednisone and was given hydrocortisone injections using insulin syringe   RECENT HISTORY:  She is on methylprednisolone which is well-tolerated; previously was on hydrocortisone injections because of GI intolerance with oral supplements    She is taking 4 mg at breakfast now mostly taking it around 5 AM She takes her 2 mg at about 3-5 PM and she thinks that sometimes this makes her nauseated  She was concerned about weight gain on the previous visit and she was tried on 3 mg in the morning but with this she started having aching and some weakness and went back to 4 mg  Her weight has leveled off and overall she feels fairly good, does have some on and off nausea as before  Sodium consistently normal  Wt Readings from Last 3 Encounters:  04/24/18 118 lb (53.5 kg)  01/23/18 119 lb 9.6 oz (54.3 kg)  10/27/17 116 lb 6.4 oz (52.8 kg)     General Endocrinology:   HYPERCALCEMIA:  She has had intermittent hypercalcemia since at least 2018, previously only temporarily associated with renal dysfunction Her PTH level was 31 previously and subsequently 23  However her calcium is variable but now slightly higher on a consistent basis  Not taking oral calcium Last vitamin D level is upper normal and supplement but stopped  She does have osteoporosis as of  2016, taking Prolia from orthopedic surgeon   Lab Results  Component Value Date   PTH 23 02/06/2017   CALCIUM 10.6 (H) 04/20/2018   PHOS 1.9 (L) 07/23/2006     ?  Secondary hypothyroidism   She has had a low normal free T4 in the past  but did not tolerate thyroxine supplementation in the past which were tried because of her continued symptoms of fatigue Free T4 has been  consistently normal subsequently    Has not had any  abnormalities of the pituitary on previous MRI of the brain  Lab Results  Component Value Date   TSH 1.30 02/10/2017   TSH 2.40 08/15/2016   TSH 0.72 03/29/2016   FREET4 0.83 02/10/2017   FREET4 0.89 08/15/2016   FREET4 0.74 03/29/2016      Allergies as of 04/24/2018   No Known Allergies     Medication List       Accurate as of April 24, 2018  9:50 AM. Always use your most recent med list.        alfuzosin 10 MG 24 hr tablet Commonly known as:  UROXATRAL Take 10 mg by mouth daily with breakfast.   cephALEXin 250 MG capsule Commonly known as:  KEFLEX 250 mg at bedtime.   Ferrous Sulfate Dried 200 (65 Fe) MG Tabs Take 65 mg by mouth daily.   fludrocortisone 0.1 MG tablet Commonly known as:  FLORINEF TAKE 2 TABLETS IN THE PM.   Melatonin 10 MG Tabs Take 10 mg by mouth at bedtime.   methylPREDNISolone 2 MG tablet Commonly known as:  MEDROL 1 1/2 at Breakfast and 1 tab at 4 pm   mirtazapine 15 MG tablet Commonly known as:  REMERON Take 15 mg by mouth at bedtime.   multivitamin tablet Take 1 tablet by mouth daily.   potassium chloride 10 MEQ tablet Commonly known as:  K-DUR TAKE 2 TABLETS EACH MORNING AND 2 TABLETS AT BEDTIME.   pramipexole 0.25 MG tablet Commonly known as:  Mirapex Take 2 tablets (0.5 mg total) by mouth at bedtime.   promethazine 25 MG tablet Commonly known as:  PHENERGAN Take 25 mg by mouth every 6 (six) hours as needed for nausea or vomiting.   SYSTANE OP Apply 2 drops to eye daily as needed (for dry eyes). For both eyes   traZODone 100 MG tablet Commonly known as:  DESYREL Take 300 mg by mouth at bedtime.   TYLENOL 8 HOUR ARTHRITIS PAIN PO Take 1-2 tablets by mouth daily.       Review of Systems    Genitourinary: She has had difficulty emptying her bladder and is being treated by urologist with various medications  OSTEOPOROSIS: This is being treated by her orthopedic surgeon with Prolia  Previous bone density in 2016  showed the following: AP LUMBAR SPINE L1 through L4 Young Adult T-Score:  -2.4 LEFT FEMUR NECK: Young Adult T-Score: -3.1  Last bone density in 12/2016 has T score of -3.3 at the dual femur neck right and -2.3 for the L1-L3     Objective:   Physical Exam  BP (!) 144/68 (BP Location: Left Arm, Patient Position: Sitting, Cuff Size: Normal)   Pulse 61   Temp 97.6 F (36.4 C)   Resp 12   Ht 5' (1.524 m)   Wt 118 lb (53.5 kg)   SpO2 97%   BMI 23.05 kg/m   Standing blood pressure was similar to sitting  Assessment:  Adrenal insufficiency:  She has long-standing secondary adrenal insufficiency with fairly typical symptoms previously and very low cortisol levels on the stimulation test As before she has been on methylprednisolone which she generally tolerates better than other steroids  She is on physiological doses and a trial of reducing the morning dose did not work and she is agreeable to continuing the same dose However she is asking about some nausea with the afternoon half tablet   Orthostatic dysautonomic hypotension syndrome:   Her blood pressure is consistently well controlled with no orthostatic symptoms and she is on a stable dose of 0.1 mg Florinef  Hypercalcemia: Calcium is slightly above normal, she has intermittent hypercalcemia Since her PTH is usually over 20 may have mild hyperparathyroidism  No change with stopping vitamin D which was upper normal   Plan:      No change in medications She was advised to continue splitting her steroid doses with 4 mg of methylprednisolone in the morning and 2 mg in the afternoons whenever she eats a meal Stop potassium supplement Continue to monitor calcium To monitor blood pressure regularly at home May have Phenergan as needed for nausea she can get further prescriptions from her PCP  Total visit time for evaluation and management of multiple problems and counseling =25 minutes  Kyrell Ruacho  Note: This  office note was prepared with Insurance underwriter. Any transcriptional errors that result from this process are unintentional.

## 2018-05-14 ENCOUNTER — Other Ambulatory Visit: Payer: Self-pay | Admitting: Endocrinology

## 2018-05-16 ENCOUNTER — Other Ambulatory Visit: Payer: Self-pay | Admitting: Endocrinology

## 2018-05-17 ENCOUNTER — Other Ambulatory Visit: Payer: Self-pay

## 2018-05-17 MED ORDER — METHYLPREDNISOLONE 4 MG PO TABS: ORAL_TABLET | ORAL | 0 refills | Status: DC

## 2018-05-22 ENCOUNTER — Other Ambulatory Visit: Payer: Self-pay

## 2018-05-22 MED ORDER — METHYLPREDNISOLONE 4 MG PO TABS: ORAL_TABLET | ORAL | 0 refills | Status: DC

## 2018-05-29 DIAGNOSIS — N183 Chronic kidney disease, stage 3 (moderate): Secondary | ICD-10-CM | POA: Diagnosis not present

## 2018-05-29 DIAGNOSIS — E2740 Unspecified adrenocortical insufficiency: Secondary | ICD-10-CM | POA: Diagnosis not present

## 2018-05-29 DIAGNOSIS — G4700 Insomnia, unspecified: Secondary | ICD-10-CM | POA: Diagnosis not present

## 2018-05-29 DIAGNOSIS — G259 Extrapyramidal and movement disorder, unspecified: Secondary | ICD-10-CM | POA: Diagnosis not present

## 2018-05-29 DIAGNOSIS — R110 Nausea: Secondary | ICD-10-CM | POA: Diagnosis not present

## 2018-05-29 DIAGNOSIS — M797 Fibromyalgia: Secondary | ICD-10-CM | POA: Diagnosis not present

## 2018-05-29 DIAGNOSIS — I10 Essential (primary) hypertension: Secondary | ICD-10-CM | POA: Diagnosis not present

## 2018-05-29 DIAGNOSIS — D508 Other iron deficiency anemias: Secondary | ICD-10-CM | POA: Diagnosis not present

## 2018-05-29 DIAGNOSIS — J301 Allergic rhinitis due to pollen: Secondary | ICD-10-CM | POA: Diagnosis not present

## 2018-05-29 DIAGNOSIS — F419 Anxiety disorder, unspecified: Secondary | ICD-10-CM | POA: Diagnosis not present

## 2018-07-03 ENCOUNTER — Other Ambulatory Visit: Payer: Self-pay | Admitting: Endocrinology

## 2018-07-16 DIAGNOSIS — N318 Other neuromuscular dysfunction of bladder: Secondary | ICD-10-CM | POA: Diagnosis not present

## 2018-07-16 DIAGNOSIS — N139 Obstructive and reflux uropathy, unspecified: Secondary | ICD-10-CM | POA: Diagnosis not present

## 2018-07-17 DIAGNOSIS — Z01419 Encounter for gynecological examination (general) (routine) without abnormal findings: Secondary | ICD-10-CM | POA: Diagnosis not present

## 2018-07-17 DIAGNOSIS — Z1231 Encounter for screening mammogram for malignant neoplasm of breast: Secondary | ICD-10-CM | POA: Diagnosis not present

## 2018-07-17 DIAGNOSIS — N952 Postmenopausal atrophic vaginitis: Secondary | ICD-10-CM | POA: Diagnosis not present

## 2018-07-17 DIAGNOSIS — Z6822 Body mass index (BMI) 22.0-22.9, adult: Secondary | ICD-10-CM | POA: Diagnosis not present

## 2018-07-31 ENCOUNTER — Other Ambulatory Visit: Payer: Self-pay | Admitting: Endocrinology

## 2018-07-31 DIAGNOSIS — R102 Pelvic and perineal pain: Secondary | ICD-10-CM | POA: Diagnosis not present

## 2018-08-14 IMAGING — DX DG ABD PORTABLE 1V
1 series · 1 of 1 positions shown · non-contrast
Comparison: 09/01/2016

CLINICAL DATA: Small bowel obstruction protocol.

EXAM:
PORTABLE ABDOMEN - 1 VIEW

[abdomen kub]
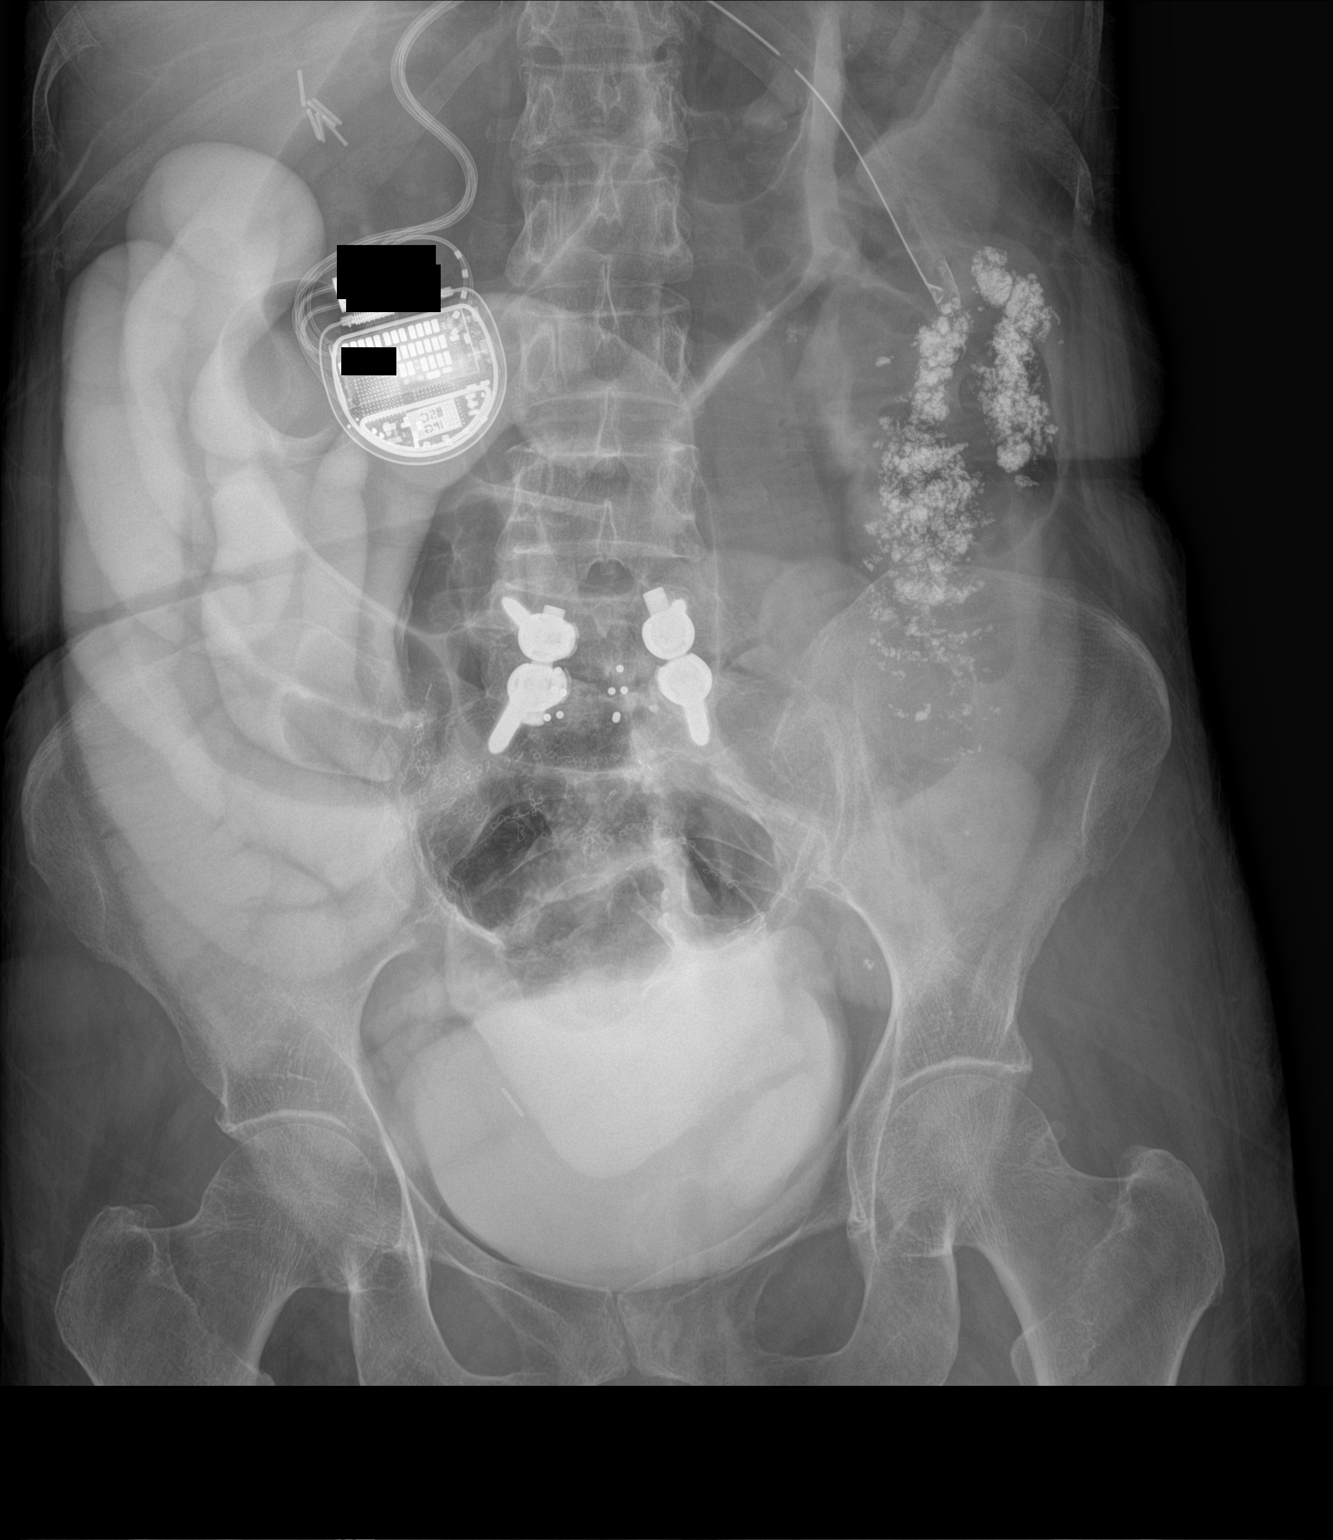

[1 of 1 positions shown; findings below may reference images not displayed]

FINDINGS: Oral contrast is seen within mildly dilated small small bowel loops
in the right abdomen. Scattered contrast is seen within the left
colon. Postsurgical changes in the pelvis.

Residual contrast within the urinary bladder.
IMPRESSION: Residual small bowel contrast within mildly dilated loops in the
right mid abdomen.

## 2018-08-14 IMAGING — DX DG ABD PORTABLE 1V
1 series · 1 of 1 positions shown · non-contrast
Comparison: CT abdomen 09/01/2016

CLINICAL DATA: NG tube placement

EXAM:
PORTABLE ABDOMEN - 1 VIEW

[abdomen kub]
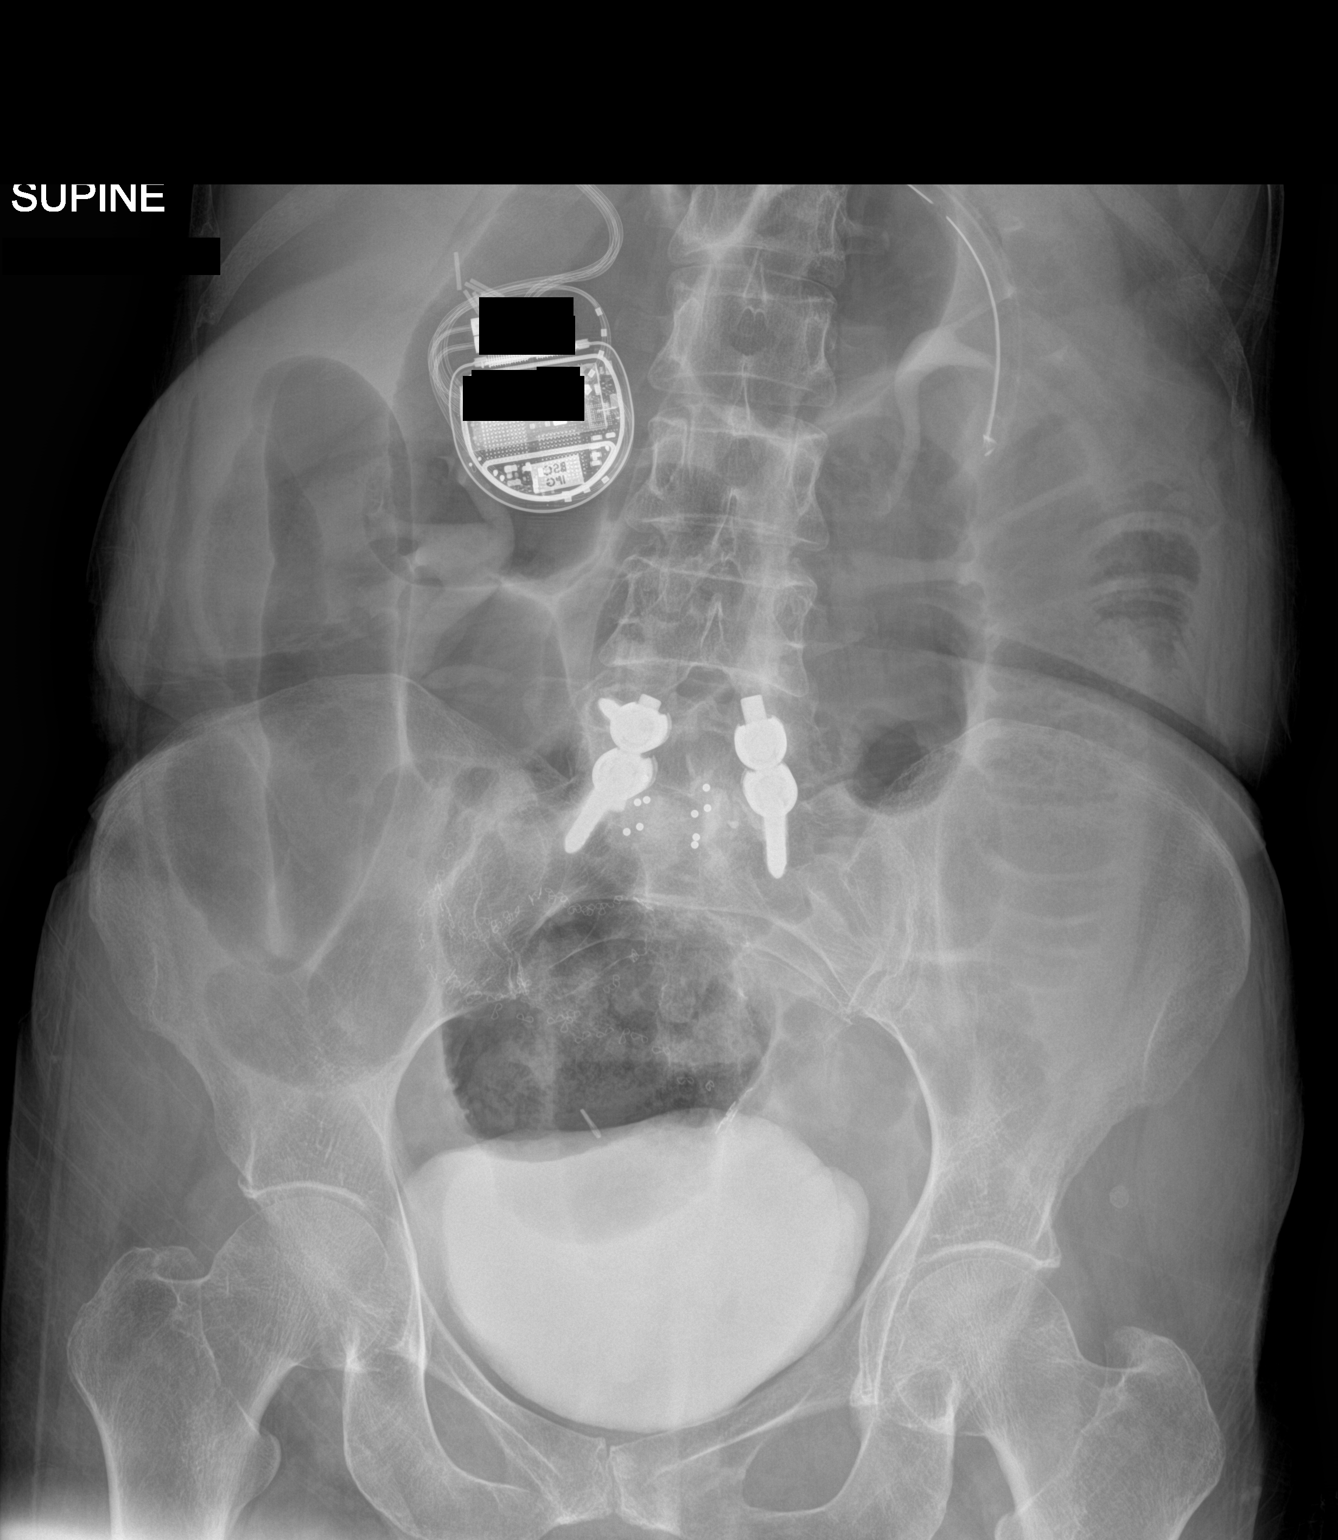

[1 of 1 positions shown; findings below may reference images not displayed]

FINDINGS: There is gaseous distension of the small bowel most consistent with
small bowel obstruction. Nasogastric tube with the tip projecting
over the stomach. There is no evidence of pneumoperitoneum, portal
venous gas or pneumatosis. There are no pathologic calcifications
along the expected course of the ureters. Posterior spinal fusion at
L5-S1.
IMPRESSION: Gaseous distension of the small bowel most consistent with small
bowel obstruction. Nasogastric tube with the tip projecting over the
stomach.

## 2018-08-14 IMAGING — CT CT ABD-PELV W/ CM
2 of 5 series · 15 of 46 positions shown, 17 images · IV contrast (ISOVUE)
Comparison: 01/01/2014

CLINICAL DATA: Nausea. Vomiting. Diarrhea. Four days duration.
Abnormal urinalysis.

EXAM:
CT ABDOMEN AND PELVIS WITH CONTRAST
TECHNIQUE: Multidetector CT imaging of the abdomen and pelvis was performed
using the standard protocol following bolus administration of
intravenous contrast.
CONTRAST:  100 cc [id number]

[Series 2: abd/pel with · axial · 0.63mm/px · z∈[+496,+871]mm · 12 of 85 slices shown, 14 images]
[im 5/85  soft-tissue]
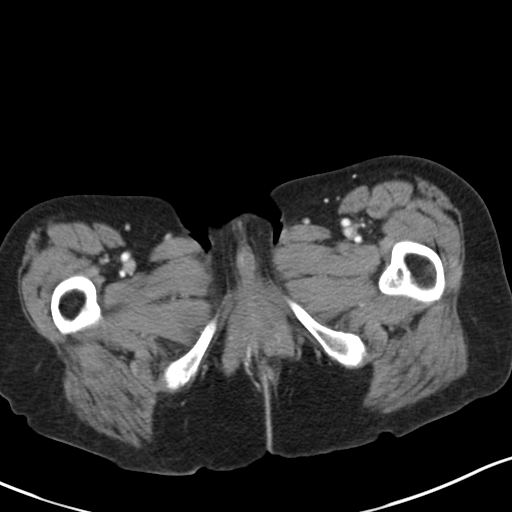
[im 5/85  bone]
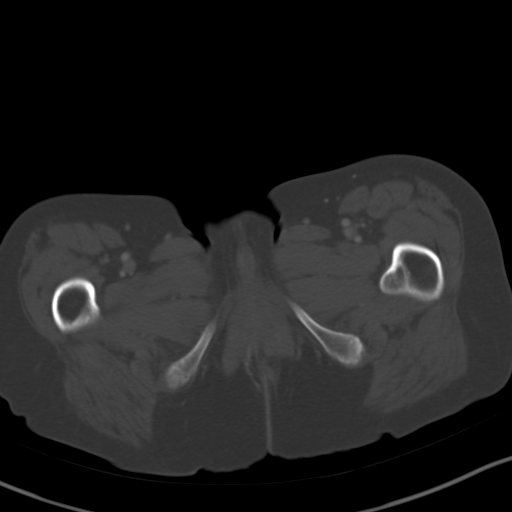
[im 15/85  soft-tissue]
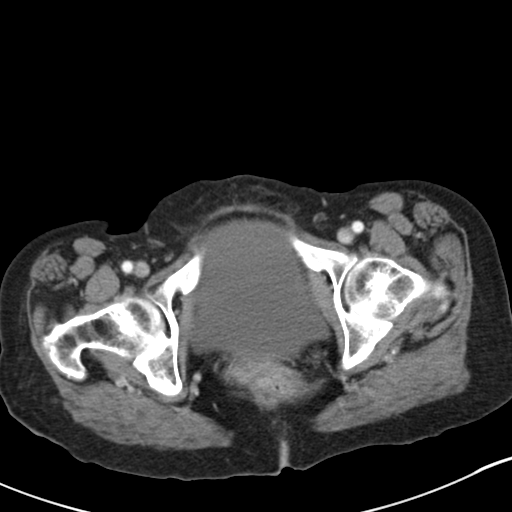
[im 20/85  soft-tissue]
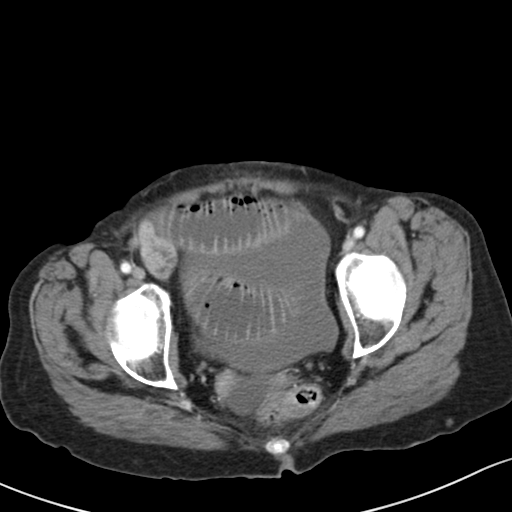
[im 25/85  soft-tissue]
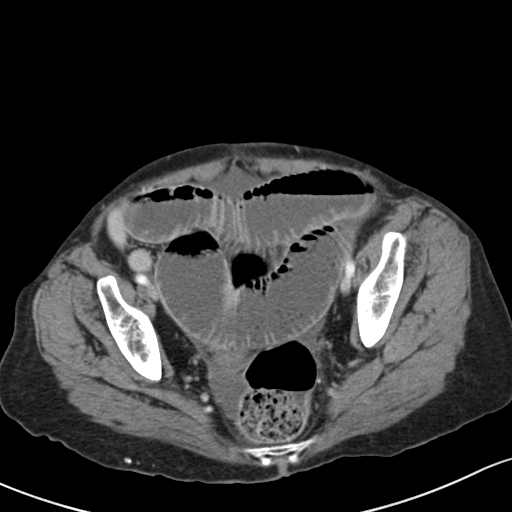
[im 35/85  soft-tissue]
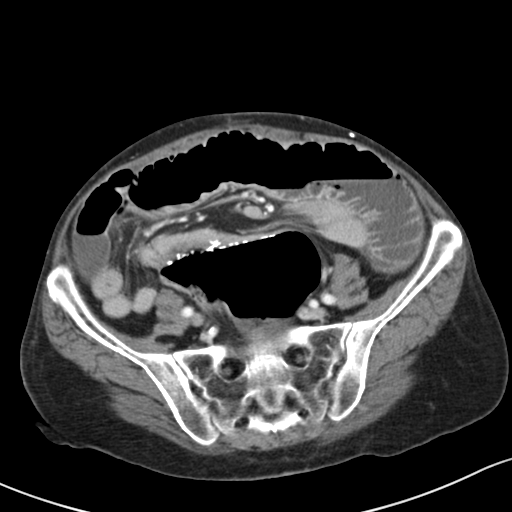
[im 40/85  soft-tissue]
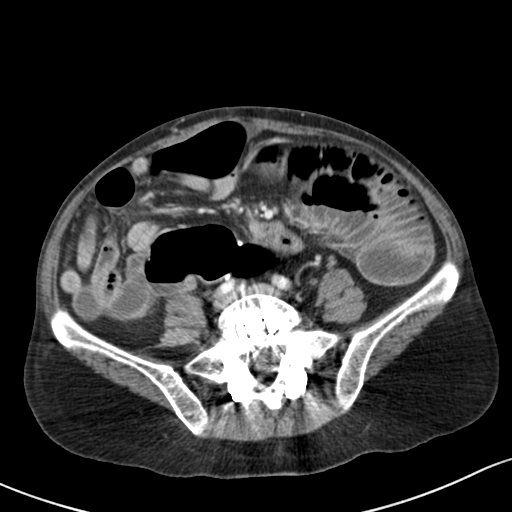
[im 45/85  soft-tissue]
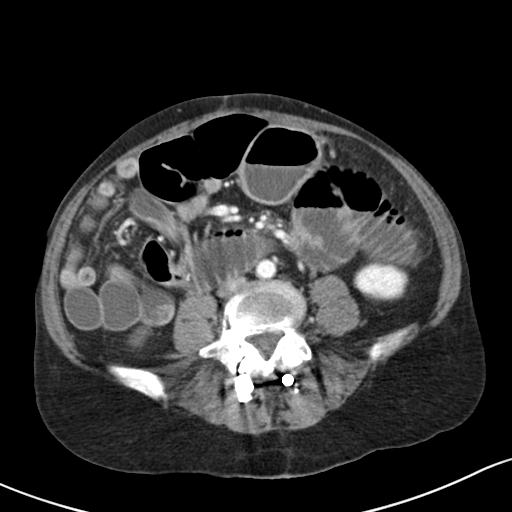
[im 55/85  soft-tissue]
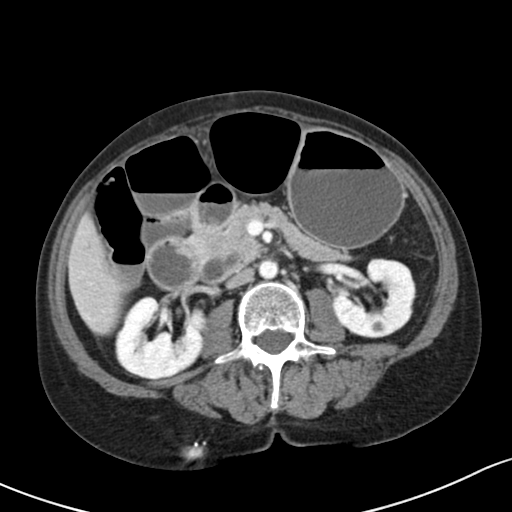
[im 60/85  soft-tissue]
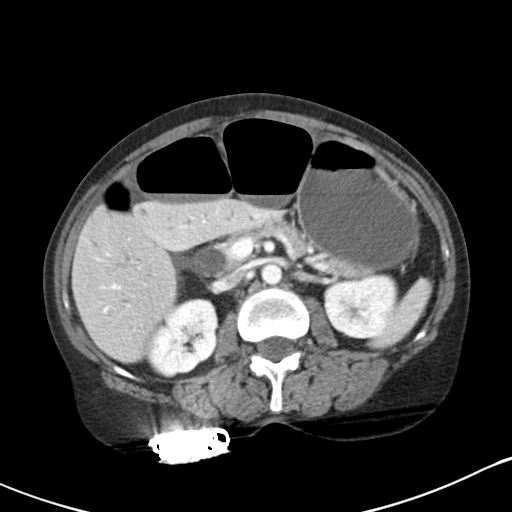
[im 60/85  bone]
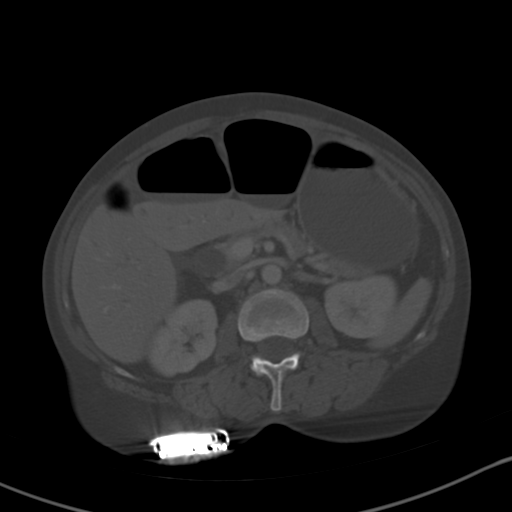
[im 65/85  soft-tissue]
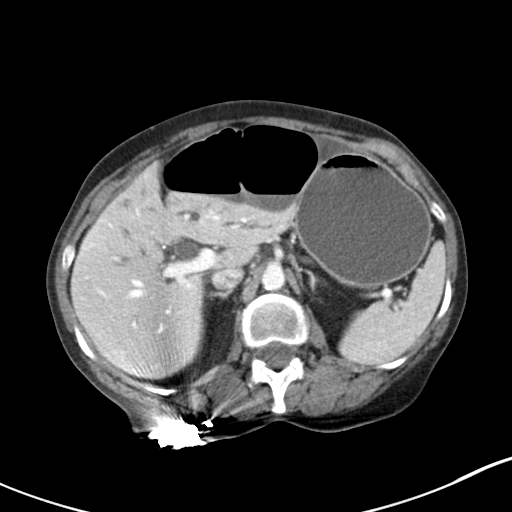
[im 75/85  soft-tissue]
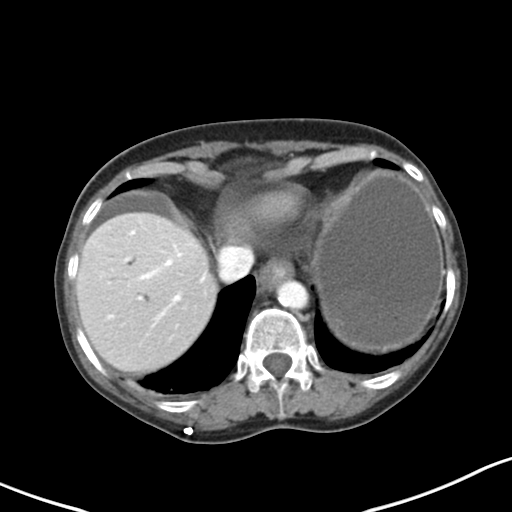
[im 80/85  soft-tissue]
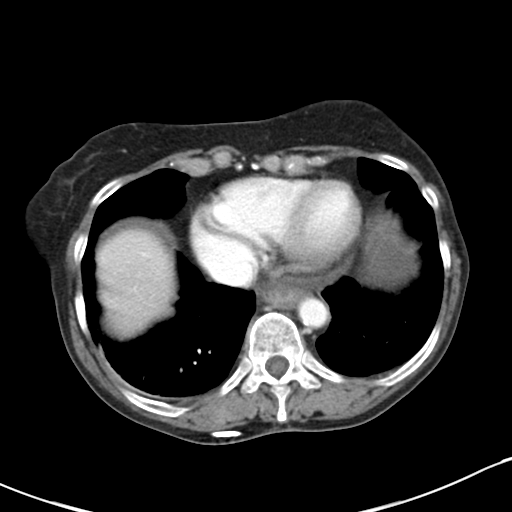

[Series 4: coronal a/|p · coronal · 0.55mm/px · 3 of 116 slices shown]
[im 39/116  soft-tissue]
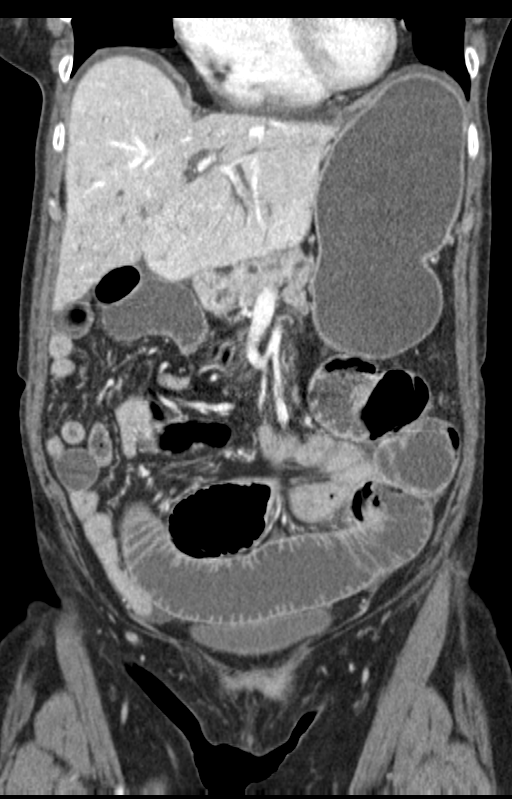
[im 52/116  soft-tissue]
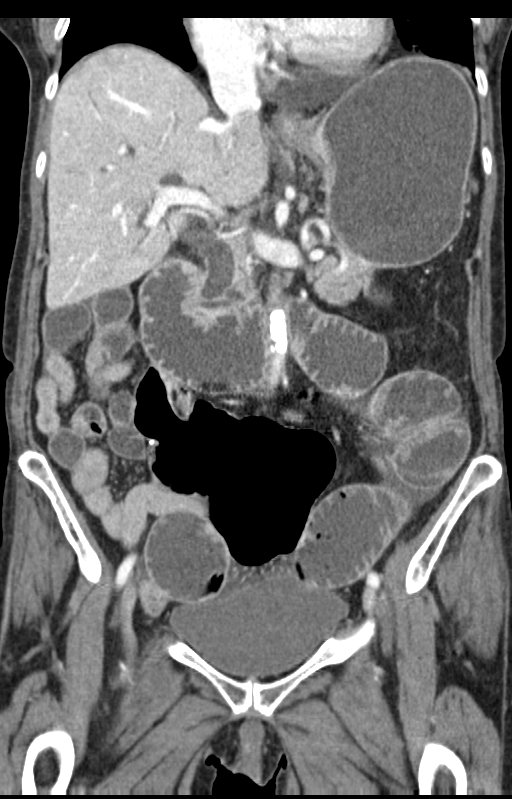
[im 64/116  soft-tissue]
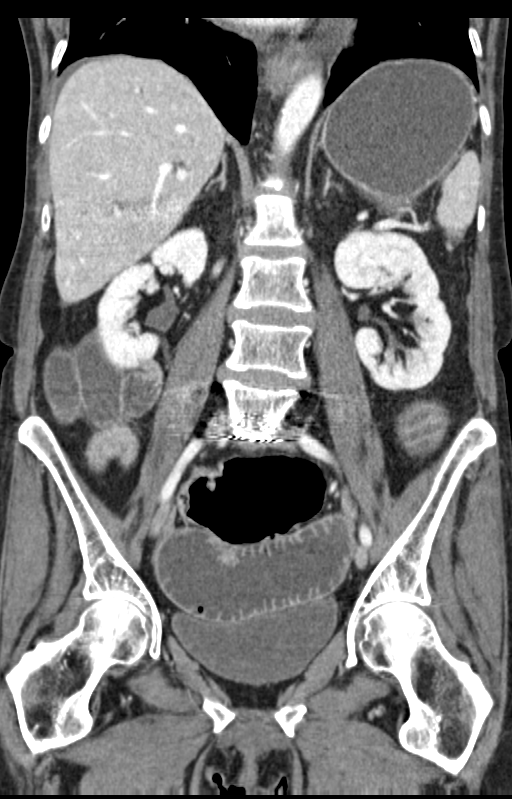

[15 of 46 positions shown; findings below may reference images not displayed]

FINDINGS: Lower chest: Mild atelectasis at the lung bases.

Hepatobiliary: Previous cholecystectomy. Pronounced intra and extra
hepatic ductal dilatation. No evidence of calcified ductal stone. No
ampullary region mass is visible by CT.

Pancreas: Dilated pancreatic ductal system as well. In the setting
of a dilated biliary ductal system, possibilities include an occult
ampullary mass or an incompetent sphincter.

Spleen: Normal

Adrenals/Urinary Tract: Adrenal glands are normal. Kidneys are
normal except for a few tiny cysts. No evidence of mass, stone or
hydronephrosis. Bladder appears normal.

Stomach/Bowel: Markedly dilated fluid-filled stomach and small
intestine consistent with small bowel obstruction. There are
collapsed loops of distal small intestine. Patient appears to of
head subtotal colectomy.

Vascular/Lymphatic: Aortic atherosclerosis. No aneurysm. IVC is
normal. No retroperitoneal adenopathy.

Reproductive: Previous hysterectomy.  No pelvic mass.

Other: Small amount of free intraperitoneal fluid.  No free air.

Musculoskeletal: Previous lumbosacral discectomy and fusion L5-S1.
IMPRESSION: High-grade small bowel obstruction in this patient has had previous
subtotal colectomy. Markedly dilated fluid and air-filled stomach
and proximal small bowel with collapsed distal small bowel. Small
amount of free fluid, presumably secondary to the small bowel
obstruction. No free air.

Marked intra and extra hepatic biliary ductal dilatation in this
patient has had previous cholecystectomy. Dilated pancreatic ductal
system. Differential diagnosis includes ampullary stricture, occult
ampullary mass, and incompetent sphincter in the setting of
high-grade small bowel obstruction.

Aortic atherosclerosis.

## 2018-08-15 IMAGING — DX DG ABD PORTABLE 1V
1 series · 1 of 1 positions shown · non-contrast
Comparison: 09/01/2016

CLINICAL DATA: Small bowel obstruction

EXAM:
PORTABLE ABDOMEN - 1 VIEW

[abdomen kub]
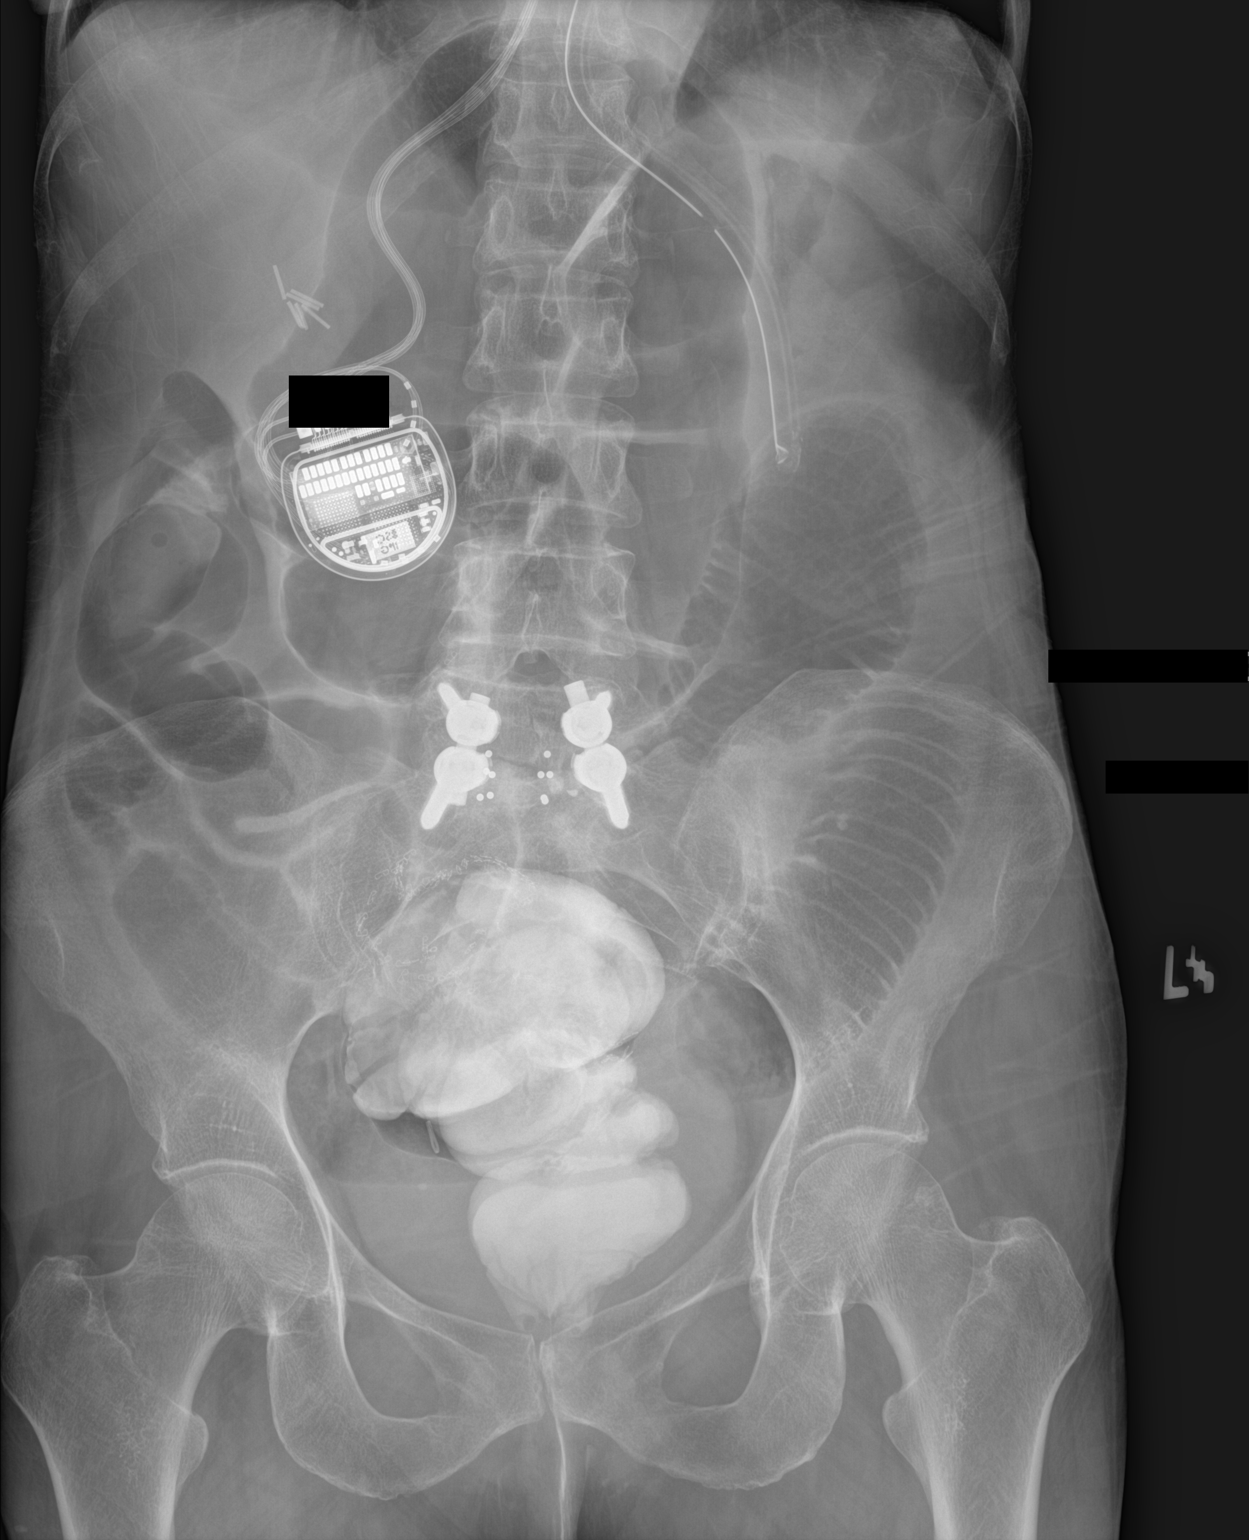

[1 of 1 positions shown; findings below may reference images not displayed]

FINDINGS: NG tube is in stable position. Continued dilated small bowel loops
compatible with small bowel obstruction. Oral contrast material is
noted within the rectosigmoid colon. Prior cholecystectomy.
IMPRESSION: Continued partial small bowel obstruction pattern. No significant
change.

## 2018-08-21 ENCOUNTER — Other Ambulatory Visit (INDEPENDENT_AMBULATORY_CARE_PROVIDER_SITE_OTHER): Payer: PPO

## 2018-08-21 ENCOUNTER — Other Ambulatory Visit: Payer: Self-pay

## 2018-08-21 DIAGNOSIS — E2749 Other adrenocortical insufficiency: Secondary | ICD-10-CM | POA: Diagnosis not present

## 2018-08-21 DIAGNOSIS — R5383 Other fatigue: Secondary | ICD-10-CM

## 2018-08-21 LAB — BASIC METABOLIC PANEL
BUN: 24 mg/dL — ABNORMAL HIGH (ref 6–23)
CO2: 26 mEq/L (ref 19–32)
Calcium: 10.2 mg/dL (ref 8.4–10.5)
Chloride: 101 mEq/L (ref 96–112)
Creatinine, Ser: 0.98 mg/dL (ref 0.40–1.20)
GFR: 55.96 mL/min — ABNORMAL LOW (ref >60.00)
Glucose, Bld: 116 mg/dL — ABNORMAL HIGH (ref 70–99)
Potassium: 3.9 mEq/L (ref 3.5–5.1)
Sodium: 136 mEq/L (ref 135–145)

## 2018-08-21 LAB — T4, FREE: Free T4: 0.77 ng/dL (ref 0.60–1.60)

## 2018-08-21 LAB — TSH: TSH: 2.21 u[IU]/mL (ref 0.35–4.50)

## 2018-08-24 ENCOUNTER — Other Ambulatory Visit: Payer: Self-pay

## 2018-08-24 ENCOUNTER — Encounter: Payer: Self-pay | Admitting: Endocrinology

## 2018-08-24 ENCOUNTER — Ambulatory Visit (INDEPENDENT_AMBULATORY_CARE_PROVIDER_SITE_OTHER): Payer: PPO | Admitting: Endocrinology

## 2018-08-24 DIAGNOSIS — M1990 Unspecified osteoarthritis, unspecified site: Secondary | ICD-10-CM | POA: Diagnosis not present

## 2018-08-24 DIAGNOSIS — G903 Multi-system degeneration of the autonomic nervous system: Secondary | ICD-10-CM

## 2018-08-24 DIAGNOSIS — F339 Major depressive disorder, recurrent, unspecified: Secondary | ICD-10-CM | POA: Diagnosis not present

## 2018-08-24 DIAGNOSIS — F329 Major depressive disorder, single episode, unspecified: Secondary | ICD-10-CM | POA: Diagnosis not present

## 2018-08-24 DIAGNOSIS — D508 Other iron deficiency anemias: Secondary | ICD-10-CM | POA: Diagnosis not present

## 2018-08-24 DIAGNOSIS — I10 Essential (primary) hypertension: Secondary | ICD-10-CM | POA: Diagnosis not present

## 2018-08-24 DIAGNOSIS — I951 Orthostatic hypotension: Secondary | ICD-10-CM

## 2018-08-24 DIAGNOSIS — N183 Chronic kidney disease, stage 3 (moderate): Secondary | ICD-10-CM | POA: Diagnosis not present

## 2018-08-24 DIAGNOSIS — D500 Iron deficiency anemia secondary to blood loss (chronic): Secondary | ICD-10-CM | POA: Diagnosis not present

## 2018-08-24 DIAGNOSIS — D509 Iron deficiency anemia, unspecified: Secondary | ICD-10-CM | POA: Diagnosis not present

## 2018-08-24 DIAGNOSIS — E2749 Other adrenocortical insufficiency: Secondary | ICD-10-CM

## 2018-08-24 MED ORDER — METHYLPREDNISOLONE 4 MG PO TABS: ORAL_TABLET | ORAL | 1 refills | Status: DC

## 2018-08-24 NOTE — Progress Notes (Signed)
Patient ID: Katrina Rivera, female   DOB: [birth year], 73 y.o.   MRN: [id number]   Subjective:       Today's office visit was provided via telemedicine using a telephone call to the patient Patient has been explained the limitations of evaluation and management by telemedicine and the availability of in person appointments.  The patient understood the limitations and agreed to proceed. Patient also understood that the telehealth visit is billable. . Location of the patient: Home . Location of the provider: Office Only the patient and myself were participating in the encounter   Chief complaint: Follow-up of various issues   PROBLEM 1: Dysautonomic orthostatic hypotension   PAST history: She has had long-standing problems with orthostatic hypotension and also hyponatremia. Has been diagnosed with dysautonomia and has multiple other problems related to autonomic neuropathy.   She has been on Florinef since 2004 previously taking 3 tablets daily along with 5 tablets of potassium which had previously controlled her symptoms well . Because of persistent orthostatic symptoms she had been tried on midodrine on 05/28/12 but this was later stopped when blood pressure increased.  She  had medication adjustments done frequently over the last year for regulating her blood pressure.  RECENT history:   She has  been on variable doses of Florinef long-term, as much as 3 a day,   More recently has required lower doses, taking only 1 tablet daily since 05/2017 She takes this in the mornings  She has not been checking blood pressure readings on her own regularly but recently started checking again Most recent blood pressure was 145/76 sitting and 123/69 standing  Previously would have lightheadedness and dizziness when just getting up from sitting and trying to walk but this is much better now She however tends to have variable readings sitting down and occasionally higher in the doctor's  office Blood pressure with her gynecologist was 138/78   PROBLEM 2: Hypokalemia previously from Florinef: With taking lower doses of Florinef more recently she has had less requirements for supplements Since her last visit she has not taken any supplement as directed    Lab Results  Component Value Date   CREATININE 0.98 08/21/2018   BUN 24 (H) 08/21/2018   NA 136 08/21/2018   K 3.9 08/21/2018   CL 101 08/21/2018   CO2 26 08/21/2018     Adrenal Insufficiency:   This is secondary to pituitary dysfunction  Prior testing included Cortrosyn test showing stimulated level of 15.7, baseline 3.9.  Also confirmed by 24 hour urine free cortisol which was only 3.0.  Although previously she had tolerated oral hydrocortisone and small doses with improvement in her symptoms she did not continue this long-term She was again symptomatic with weight loss, decreased appetite, nausea and also diarrhea Cortrosyn stimulation test on 03/12/14 showed baseline cortisol level of 0.3 and post injection of 1.1 only  She had GI side effects from hydrocortisone and prednisone and was in the past using hydrocortisone injections using insulin syringe   RECENT HISTORY:  She is on methylprednisolone which is well-tolerated; previously was on hydrocortisone injections because of GI intolerance with oral supplements   She is taking 4 mg at breakfast or early morning She is taking half tablet late afternoon  She was concerned about weight gain on the previous visit and she was tried on 3 mg dosage in the morning but with this she started having aching and some weakness and went back to 4 mg  Her  weight has fluctuated and has been as high as 122  Currently she has not had any nausea which she had previously and likely eating better No weakness, lightheadedness Previous exams have not shown any cushingoid features  Sodium and potassium normal  Wt Readings from Last 3 Encounters:  04/24/18 118 lb (53.5 kg)   01/23/18 119 lb 9.6 oz (54.3 kg)  10/27/17 116 lb 6.4 oz (52.8 kg)     General Endocrinology:   HYPERCALCEMIA:  She has had intermittent hypercalcemia since at least 2018, previously only temporarily associated with renal dysfunction Her PTH level was 31 previously and subsequently 23  However her calcium is variable, most recently upper normal  Last vitamin D level was upper normal and supplement was stopped Also not on calcium supplements  She does have osteoporosis as of 2016, taking Prolia from orthopedic surgeon   Lab Results  Component Value Date   PTH 23 02/06/2017   CALCIUM 10.2 08/21/2018   PHOS 1.9 (L) 07/23/2006     ?  Secondary hypothyroidism  No unusual complaints of fatigue at this time Free T4 has been  consistently normal although in the past had been low normal Could not even tolerate 25 mcg levothyroxine in the past when free T4 was low normal   Has not had any abnormalities of the pituitary on previous MRI of the brain  Lab Results  Component Value Date   TSH 2.21 08/21/2018   TSH 1.30 02/10/2017   TSH 2.40 08/15/2016   FREET4 0.77 08/21/2018   FREET4 0.83 02/10/2017   FREET4 0.89 08/15/2016      Allergies as of 08/24/2018   No Known Allergies     Medication List       Accurate as of August 24, 2018 10:32 AM. If you have any questions, ask your nurse or doctor.        STOP taking these medications   Melatonin 10 MG Tabs Stopped by: Reather Littler, MD   potassium chloride 10 MEQ tablet Commonly known as: K-DUR Stopped by: Reather Littler, MD   pramipexole 0.25 MG tablet Commonly known as: Mirapex Stopped by: Reather Littler, MD   pramipexole 1.5 MG tablet Commonly known as: MIRAPEX Stopped by: Reather Littler, MD   traZODone 100 MG tablet Commonly known as: DESYREL Stopped by: Reather Littler, MD     TAKE these medications   alfuzosin 10 MG 24 hr tablet Commonly known as: UROXATRAL Take 10 mg by mouth daily with breakfast.   Bentyl 10 MG  capsule Generic drug: dicyclomine Take 10 mg by mouth 4 (four) times daily -  before meals and at bedtime. Take 1 tablet by mouth 30 minutes before meals and at bedtime.   cephALEXin 250 MG capsule Commonly known as: KEFLEX 250 mg See admin instructions. Take 1 tablet by mouth every other night at bedtime.   Ferrous Sulfate Dried 200 (65 Fe) MG Tabs Take 65 mg by mouth daily.   fludrocortisone 0.1 MG tablet Commonly known as: FLORINEF TAKE 2 TABLETS IN THE PM. What changed: See the new instructions.   Horizant 300 MG Tbcr Generic drug: Gabapentin Enacarbil ER Take 1 tablet by mouth daily. Take 1 tablet by mouth once daily at 5pm.   methylPREDNISolone 4 MG tablet Commonly known as: MEDROL TAKE 1 TABLET (4MG ) AT BREAKFAST AND 1/2 TABLET (2MG ) AT 4PM.   mirtazapine 15 MG tablet Commonly known as: REMERON Take 15 mg by mouth at bedtime.   multivitamin tablet Take 1 tablet by  mouth daily.   promethazine 25 MG tablet Commonly known as: PHENERGAN Take 1 tablet (25 mg total) by mouth every 8 (eight) hours as needed for nausea.   SYSTANE OP Apply 2 drops to eye daily as needed (for dry eyes). For both eyes   TYLENOL 8 HOUR ARTHRITIS PAIN PO Take 1-2 tablets by mouth daily.       Review of Systems    Genitourinary: She has had difficulty emptying her bladder and is being treated by urologist   OSTEOPOROSIS: This is being treated by her orthopedic surgeon with Prolia  Previous bone density in 2016 showed the following: AP LUMBAR SPINE L1 through L4 Young Adult T-Score:  -2.4 LEFT FEMUR NECK: Young Adult T-Score: -3.1  Last bone density in 12/2016 has T score of -3.3 at the dual femur neck right and -2.3 for the L1-L3  She is now taking extended release gabapentin for restless legs, insomnia, leg pains and has had better relief    Objective:   Physical Exam  There were no vitals taken for this visit.  Exam not done, patient is remote  Assessment:        Adrenal insufficiency:  She has long-standing secondary adrenal insufficiency with fairly typical symptoms at baseline and very low cortisol levels on the stimulation test As before she has been on methylprednisolone which she tolerates better than other oral steroids  She is on physiological doses using 4 mg in the morning and 2 mg in the evening Although she was concerned about possible weight gain from this this seems unlikely and she has symptoms with lower doses She is subjectively doing well now Electrolytes normal   Orthostatic dysautonomic hypotension syndrome:   Her blood pressure is not dropping excessively with 0.1 mg Florinef She may have a tendency to relatively higher systolic readings sitting at times, recently standing blood pressure 125  Not monitoring blood pressure regularly enough   Hypercalcemia: Calcium is upper normal now and previously has been higher  Since her PTH is usually over 20 may have mild hyperparathyroidism She is already being treated for osteoporosis Previously vitamin D supplement has been started because of upper normal levels on supplement  History of hypokalemia: Resolved and does not need any supplements with using lower doses of Florinef   Plan:      No change in medications as discussed above  Recommended more regular checking of her sitting and standing blood pressure readings and to call if consistently abnormal She can try to do this at least once a week 90-day supply is sent for her steroid  Continue follow-up with other physicians including rheumatologist for osteoporosis  Duration of telephone encounter =12 minutes  Jaquez Farrington  Note: This office note was prepared with Insurance underwriterDragon voice recognition system technology. Any transcriptional errors that result from this process are unintentional.

## 2018-08-27 DIAGNOSIS — G259 Extrapyramidal and movement disorder, unspecified: Secondary | ICD-10-CM | POA: Diagnosis not present

## 2018-08-27 DIAGNOSIS — N183 Chronic kidney disease, stage 3 (moderate): Secondary | ICD-10-CM | POA: Diagnosis not present

## 2018-08-27 DIAGNOSIS — I10 Essential (primary) hypertension: Secondary | ICD-10-CM | POA: Diagnosis not present

## 2018-08-27 DIAGNOSIS — E2740 Unspecified adrenocortical insufficiency: Secondary | ICD-10-CM | POA: Diagnosis not present

## 2018-08-27 DIAGNOSIS — M797 Fibromyalgia: Secondary | ICD-10-CM | POA: Diagnosis not present

## 2018-08-27 DIAGNOSIS — G4700 Insomnia, unspecified: Secondary | ICD-10-CM | POA: Diagnosis not present

## 2018-09-27 DIAGNOSIS — D500 Iron deficiency anemia secondary to blood loss (chronic): Secondary | ICD-10-CM | POA: Diagnosis not present

## 2018-09-27 DIAGNOSIS — N183 Chronic kidney disease, stage 3 (moderate): Secondary | ICD-10-CM | POA: Diagnosis not present

## 2018-09-27 DIAGNOSIS — I10 Essential (primary) hypertension: Secondary | ICD-10-CM | POA: Diagnosis not present

## 2018-09-27 DIAGNOSIS — F339 Major depressive disorder, recurrent, unspecified: Secondary | ICD-10-CM | POA: Diagnosis not present

## 2018-09-27 DIAGNOSIS — D508 Other iron deficiency anemias: Secondary | ICD-10-CM | POA: Diagnosis not present

## 2018-09-27 DIAGNOSIS — M1990 Unspecified osteoarthritis, unspecified site: Secondary | ICD-10-CM | POA: Diagnosis not present

## 2018-09-27 DIAGNOSIS — F329 Major depressive disorder, single episode, unspecified: Secondary | ICD-10-CM | POA: Diagnosis not present

## 2018-09-27 DIAGNOSIS — D509 Iron deficiency anemia, unspecified: Secondary | ICD-10-CM | POA: Diagnosis not present

## 2018-10-23 ENCOUNTER — Other Ambulatory Visit: Payer: Self-pay | Admitting: Sports Medicine

## 2018-10-23 DIAGNOSIS — M810 Age-related osteoporosis without current pathological fracture: Secondary | ICD-10-CM

## 2018-10-23 DIAGNOSIS — E559 Vitamin D deficiency, unspecified: Secondary | ICD-10-CM | POA: Diagnosis not present

## 2018-11-01 ENCOUNTER — Other Ambulatory Visit: Payer: Self-pay

## 2018-11-01 ENCOUNTER — Ambulatory Visit: Payer: PPO | Admitting: Neurology

## 2018-11-01 ENCOUNTER — Encounter: Payer: Self-pay | Admitting: Neurology

## 2018-11-01 VITALS — BP 148/90 | HR 58 | Temp 97.3°F | Ht 60.0 in | Wt 124.5 lb

## 2018-11-01 DIAGNOSIS — M545 Low back pain, unspecified: Secondary | ICD-10-CM | POA: Insufficient documentation

## 2018-11-01 DIAGNOSIS — G35 Multiple sclerosis: Secondary | ICD-10-CM | POA: Diagnosis not present

## 2018-11-01 DIAGNOSIS — R269 Unspecified abnormalities of gait and mobility: Secondary | ICD-10-CM

## 2018-11-01 MED ORDER — HORIZANT 300 MG PO TBCR: 2.0000 | EXTENDED_RELEASE_TABLET | Freq: Every day | ORAL | 11 refills | Status: DC

## 2018-11-01 NOTE — Progress Notes (Signed)
[facility]  PATIENT: Katrina Rivera DOB: 09/07/1945   HISTORY OF PRESENT ILLNESS:  Katrina Rivera is a very pleasant 73 year-old right-handed woman, with relapsing remitting multiple sclerosis, was treated with Betaseron 4 -5 years, but could not tolerate the side effect, now is not on any immunomodulation therapy. neuropathic pain of bilateral lower extremities.  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   She has baseline gait difficulty, she fell in May 2013, fractured both elbows and her left knee cap. She intermittently has been using a cane or walker.  She could not tolerate Requip because of nausea. She has been on gabapentin 300 mg 3 bid. She gained significant weight gain when on Lyrica, and therefore was switched back to gabapentin. She has orthostatic hypotension, and is on midodrine 2.5 mg and fludrocortisone 0.1 mg 1 in AM and 2 at night, and she is also on 20 mg of hydrocortisone.  She complains of bilateral lower extremity burning stinging sensation, getting worse when she sits still, difficulty falling into sleep, she has the urge to move because of bilateral extremity discomfort, has tried Requip, could not tolerate it because of GI side effects, Neuprol patch does not work either, she also tried Elavil, Cymbalta in the past, could not tolerate it due to side effect.   Jan 01/2014: She came in urgently for acute worsening of her generalized condition since her lumbar  decompression surgery in January 24 2013 by Dr. Marikay Alar, prior to surgery, she suffered left-sided low back pain, radiating pain to bilateral lower extremity, left is much worse than her right side, there was left L5-S1 extraforaminal herniated nucleus pulposus with left L5 radiculopathy she had left L5-S1 extraforaminal microdiscectomy utilizing microscopic dissection.  She had overnight stay at the hospital the day after surgery, she fell when trying to get up using bathroom, landed on her left side, surgery did help her lower back pain, radiating pain to her left leg, but she complained of worsening bilateral lower extremity deep achy pain, worsening gait difficulty, she has not used walker for 3 years, now she began to use her walker, she felt her skin is so tight at bilateral lower extremity, left leg swelling, also has difficulty swallowing, blurred vision, worsening fatigue, difficulty concentration, she has not had MRI evaluation for her multiple sclerosis for many years, is not on any immunomodulation therapy   UPDATE Mar 01, 2014:She came in with a list of complains, much worse compared to presurgical level. She complains of blurred vision, difficulty sleeping, difficulty concentrating, dizziness, worsening gait difficulty, The most bothersome symptoms are bilateral lower extremity pain, constant, knife cutting pain, left worse than right Left leg showed no DVT on doppler study. She had 15 LB weight gain over one month, more difficulty sleepy, body shaking, more difficulty sleepy.  She has tried fentanyl patch 50 mcg, Trileptal, Neurontin without helping her symptoms .She is going to be seen by her surgeon Dr. Yetta Barre in 3 days  We have reviewed MRI of the brain cervical lumbar spine together, these were done at [facility] imaging in  January 2015 MRI lumbar: L5-S1: disc bulging and facet hypertrophy, status post microdiscectomy on the left, with expected post-surgical changes. Multi-level facet  hypertrophy, with no spinal stenosis or foraminal narrowing.  MRI cervical: Possible small chronic demyelinating plaque at C6 on the right side. No abnormal enhancing lesions  MRI brain: Multiple supratentorial and 1 infratentorial chronic demyelinating plaques. No acute plaques. Mild cerebellar tonsillar ectopia there was no significant change in the brain lesions,   UPDATE 08/22/13  Patient returns for follow up.Her biggest complaint today is restless legs. She has never tried Neupro. She has had multiple trials of medications for her chronic pain. She is currently taking fentanyl, Trileptal, Cymbalta, lidocaine gel  UPDATE Feb 27 2014:She is not on any long-term immunomodulation therapy for her relapsing remitting multiple sclerosis, neurological deficit has been fairly stable, she has baseline gait difficulty, the most bothersome symptoms for her is low back pain, bilateral lower extremity deep achy pain, radiating pain from left lower back to her left leg,  She was recently found to have very low cortisol level, under endocrinologist Dr. Remus Blake care, UPDATE July 23 2014: She had lumbar decompression by Dr. Marikay Alar in Jun 18 2014, which did help her low back pain, but now she experienced worsening left lower extremity spasm, left calf spasm so hard, as if a knot was tied, difficulty walking, she was giving a steroid package, no help, She complained excessive weight gain with Lyrica, Neurontin did not help,  UPDATE July 27th 2016:  She came in with a list of complaints, continue complains of unbalanced, staggering, generalized weakness, numbness tingling at bilateral lower extremity, worsening at her left leg, frequent urination, difficulty concentrating, difficulty with multitasking  UPDATE Oct 22 2014: She had a syncope episode on October 08 2014, was taken to the emergency room, have reviewed ED record, blood pressure was 200/60s laboratory showed normal CBC, CMP, this happened in the  setting of missing her hydrocortisone dose, because of nausea, lack of appetite, dehydration, she is taking hydrocortisone because of Addison's disease, now she is on hydrocortisone shots,She is back to her baseline now, mild gait difficulty, also complains of anxiety, constant bilateral lower chamber paresthesia, she wants to get off Cymbalta 30 mg daily, worry about the long-term side effect. She also complains of bilateral neck, shoulder or upper extremity itching,  UPDATE Apr 05 2016: She had a spinal stimulator placement by neurosurgeon Dr. Buck Mam in January third 2018, which has helped her low back pain,and leg pain.  She had sinus infection, was treated with Zpack and nausea,  She has iron infusion in Feb 2018,  She noticed that she has worsening gait abnormality,  She also has diffuse body achy pain. She has tried Flexeril without benefit, previously tried and failed gabapentin, Cymbalta, fentanyl patch,  CPK was normal 39 WBC showed hemoglobin of 10 point 8, which was mildly decreased normal free T4, TSH, CMP, creat 1.1,  normal B12,  UPDATE Sept 24th 2020: She is overall stable, ambulate without assistant, but with mild unsteady gait, she had a spinal stimulator placement in 2018, is no longer a candidate for MRI, I personally reviewed previous MRIs in 2015, MRI of the brain, multiple supratentorium, and one infratentorial chronic demyelinating plaque, no acute abnormality.  MRI of cervical spine, possible small chronic demyelinating plaque at right C6.  MRI of lumbar spine, multilevel degenerative changes, postsurgical changes L5-S1 with evidence of left microdiscectomy.  She is now taking Horizant 300 mg every night, which does  help her restless leg symptoms some, but at the end of the day, she complains of whole body tightness,  Laboratory evaluation July 2020, normal CMP, TSH, free T4, BMP with GFR of 56, vitamin D of 87,  REVIEW OF SYSTEMS: Full 14 system review of systems  performed and notable only for those listed, all others are neg:  As above.   ALLERGIES: No Known Allergies  HOME MEDICATIONS: Outpatient Medications Prior to Visit  Medication Sig Dispense Refill  . Acetaminophen (TYLENOL 8 HOUR ARTHRITIS PAIN PO) Take 1-2 tablets by mouth daily.    Marland Kitchen. alfuzosin (UROXATRAL) 10 MG 24 hr tablet Take 10 mg by mouth daily with breakfast.    . cephALEXin (KEFLEX) 250 MG capsule 250 mg See admin instructions. Take 1 tablet by mouth every other night at bedtime.    . Cholecalciferol (VITAMIN D3 PO) Take 1 tablet by mouth daily.    Marland Kitchen. dicyclomine (BENTYL) 10 MG capsule Take 10 mg by mouth 4 (four) times daily -  before meals and at bedtime. Take 1 tablet by mouth 30 minutes before meals and at bedtime.    . Ferrous Sulfate Dried 200 (65 FE) MG TABS Take 65 mg by mouth daily.    . fludrocortisone (FLORINEF) 0.1 MG tablet TAKE 2 TABLETS IN THE PM. (Patient taking differently: Take 0.1 mg by mouth daily. Take 1 tablet by mouth once daily in the morning.) 180 tablet 0  . Gabapentin Enacarbil ER (HORIZANT) 300 MG TBCR Take 1 tablet by mouth daily. Take 1 tablet by mouth once daily at 5pm.    . methylPREDNISolone (MEDROL) 4 MG tablet TAKE 1 TABLET (4MG ) AT BREAKFAST AND 1/2 TABLET (2MG ) AT 4PM. 135 tablet 1  . mirtazapine (REMERON) 15 MG tablet Take 15 mg by mouth at bedtime.    . Multiple Vitamin (MULTIVITAMIN) tablet Take 1 tablet by mouth daily.    Bertram Gala. Polyethyl Glycol-Propyl Glycol (SYSTANE OP) Apply 2 drops to eye daily as needed (for dry eyes). For both eyes    . promethazine (PHENERGAN) 25 MG tablet Take 1 tablet (25 mg total) by mouth every 8 (eight) hours as needed for nausea. 15 tablet 0   No facility-administered medications prior to visit.     PAST MEDICAL HISTORY: Past Medical History:  Diagnosis Date  . Addison's disease (HCC)    takes Solu Cortef daily  . Anemia    takes Ferrous Sulfate daily  . Anxiety    takes Xanax nightly  . Arthritis   .  Chronic back pain    stenosis  . Depression    takes Cymbalta daily  . Dizziness    if b/p drops   . Fibromyalgia   . Fibromyalgia   . GERD (gastroesophageal reflux disease)   . Headache(784.0)   . History of blood transfusion    no abnormal reaction noted  . History of bronchitis    many yrs ago   . Hypokalemia    takes Potassium daily  . Hypotension   . Hypotension    takes Florinef daily  . IBS (irritable bowel syndrome)    takes Librarian, academicAlign daily  . Insomnia    takes Trazodone nightly  . Joint pain   . Multiple sclerosis (HCC)    doesn't take any meds  . Multiple sclerosis (HCC)   . Nocturia   . Osteoporosis   . Peripheral neuropathy   . Restless leg syndrome   . Seasonal allergies    takes Zyrtec daily;uses Flonase daily as  needed  . Syncope   . Urinary frequency    takes Flomax daily  . Weakness    numbness and tingling    PAST SURGICAL HISTORY: Past Surgical History:  Procedure Laterality Date  . ABDOMINAL HYSTERECTOMY  1988  . APPENDECTOMY  1988  . CESAREAN SECTION  1973/1977   x2  . CHOLECYSTECTOMY  1997  . COLECTOMY  1990  . COLONOSCOPY    . ESOPHAGOGASTRODUODENOSCOPY    . EYE SURGERY     bilateral - /w IOL  . FRACTURE SURGERY Right    rods and screws  . LUMBAR LAMINECTOMY/DECOMPRESSION MICRODISCECTOMY Left 01/24/2013   Procedure: LUMBAR FIVE TO SACRAL ONE LUMBAR LAMINECTOMY/DECOMPRESSION MICRODISCECTOMY 1 LEVEL;  Surgeon: Eustace Moore, MD;  Location: [place] [facility];  Service: Neurosurgery;  Laterality: Left;  Marland Kitchen MAXIMUM ACCESS (MAS)POSTERIOR LUMBAR INTERBODY FUSION (PLIF) 1 LEVEL N/A 06/18/2014   Procedure: MAXIMUM ACCESS SURGERY POSTERIOR LUMBAR INTERBODY FUSION LUMBAR FIVE TO SACRAL ONE ;  Surgeon: Eustace Moore, MD;  Location: [place] [facility];  Service: Neurosurgery;  Laterality: N/A;    FAMILY HISTORY: Family History  Problem Relation Age of Onset  . Hypertension Mother   . Stroke Mother   . Heart attack Father   . Tremor Brother     SOCIAL  HISTORY: Social History   Socioeconomic History  . Marital status: Married    Spouse name: [name]  . Number of children: 2  . Years of education: 32  . Highest education level: Not on file  Occupational History    Employer: DISABLED    Comment: Disabled  Social Needs  . Financial resource strain: Not on file  . Food insecurity    Worry: Not on file    Inability: Not on file  . Transportation needs    Medical: Not on file    Non-medical: Not on file  Tobacco Use  . Smoking status: Never Smoker  . Smokeless tobacco: Never Used  Substance and Sexual Activity  . Alcohol use: No  . Drug use: No  . Sexual activity: Not on file  Lifestyle  . Physical activity    Days per week: Not on file    Minutes per session: Not on file  . Stress: Not on file  Relationships  . Social Herbalist on phone: Not on file    Gets together: Not on file    Attends religious service: Not on file    Active member of club or organization: Not on file    Attends meetings of clubs or organizations: Not on file    Relationship status: Not on file  . Intimate partner violence    Fear of current or ex partner: Not on file    Emotionally abused: Not on file    Physically abused: Not on file    Forced sexual activity: Not on file  Other Topics Concern  . Not on file  Social History Narrative   Pt lives at home with spouse. ([name])   Caffeine Use: 1 cups daily.   Right handed.   Disabled.   Education - high school   Patient has two adult children.     PHYSICAL EXAM  Vitals:   11/01/18 1009  BP: (!) 148/90  Pulse: (!) 58  Temp: (!) 97.3 F (36.3 C)  Weight: 124 lb 8 oz (56.5 kg)  Height: 5' (1.524 m)   Body mass index is 24.31 kg/m.  Generalized: Well developed, in no acute distress  Head:  normocephalic and atraumatic,. Oropharynx benign  Neck: Supple,  Musculoskeletal: No deformity   Neurological examination   Mentation: Alert oriented to time, place, history taking.  Attention span and concentration appropriate. Recent and remote memory intact.  Follows all commands speech and language fluent.   Cranial nerve II-XII: .Pupils were equal round reactive to light extraocular movements were full, facial sensation and strength were normal. hearing was intact to finger rubbing bilaterally. Uvula tongue midline. head turning and shoulder shrug were normal and symmetric.Tongue protrusion into cheek strength was normal. Motor: Mild lower extremity spasticity no significant upper or lower weakness  Sensory: normal and symmetric to light touch, pinprick, and  Vibration, in the upper and lower extremities  Coordination: finger-nose-finger, heel-to-shin bilaterally, no dysmetria Reflexes: Symmetric upper and lower plantar responses were flexor bilaterally. Gait and Station: Rising up from seated position by pushing on chair arm, mildly unsteady, dragging left leg DIAGNOSTIC DATA (LABS, IMAGING, TESTING) - I reviewed patient records, labs, notes, testing and imaging myself where available.  Lab Results  Component Value Date   WBC 8.5 02/10/2017   HGB 11.9 (L) 02/10/2017   HCT 36.5 02/10/2017   MCV 93.1 02/10/2017   PLT 284.0 02/10/2017      Component Value Date/Time   NA 136 08/21/2018 1026   NA 140 02/06/2017 1126   K 3.9 08/21/2018 1026   CL 101 08/21/2018 1026   CO2 26 08/21/2018 1026   GLUCOSE 116 (H) 08/21/2018 1026   BUN 24 (H) 08/21/2018 1026   BUN 22 02/06/2017 1126   CREATININE 0.98 08/21/2018 1026   CREATININE 0.85 11/06/2013 0840   CALCIUM 10.2 08/21/2018 1026   PROT 6.8 04/20/2018 0822   ALBUMIN 4.5 04/20/2018 0822   AST 23 04/20/2018 0822   ALT 17 04/20/2018 0822   ALKPHOS 32 (L) 04/20/2018 0822   BILITOT 0.4 04/20/2018 0822   GFRNONAA 54 (L) 02/06/2017 1126   GFRAA 62 02/06/2017 1126    Lab Results  Component Value Date   VITAMINB12 1,190 (H) 03/10/2016   Lab Results  Component Value Date   TSH 2.21 08/21/2018      ASSESSMENT  AND PLAN  73 y.o. year old female   Relapsing Remitting Multiple Sclerosis  Last MRIs was in 2015, evidence of supratentorium, infratentorial, spinal cord involvement  Long-term immunomodulation therapy  Denies flareup, Not MRI candidate   Chronic low back pain, bilateral lower extremity pain, status post spinal cord stimulator placement in January 2018   Restless leg symptoms  Continue Horizant 300 mg, may increase to 2 tablets every night  Levert FeinsteinYijun Khari Lett, M.D. Ph.D.  [facility] [address] [place], [place] [address] Phone: [phone] Fax:      [phone]

## 2018-11-22 ENCOUNTER — Other Ambulatory Visit: Payer: Self-pay | Admitting: Endocrinology

## 2018-11-26 DIAGNOSIS — E2740 Unspecified adrenocortical insufficiency: Secondary | ICD-10-CM | POA: Diagnosis not present

## 2018-11-26 DIAGNOSIS — I10 Essential (primary) hypertension: Secondary | ICD-10-CM | POA: Diagnosis not present

## 2018-11-26 DIAGNOSIS — Z23 Encounter for immunization: Secondary | ICD-10-CM | POA: Diagnosis not present

## 2018-11-26 DIAGNOSIS — G35 Multiple sclerosis: Secondary | ICD-10-CM | POA: Diagnosis not present

## 2018-11-26 DIAGNOSIS — F5101 Primary insomnia: Secondary | ICD-10-CM | POA: Diagnosis not present

## 2018-11-26 DIAGNOSIS — D509 Iron deficiency anemia, unspecified: Secondary | ICD-10-CM | POA: Diagnosis not present

## 2018-11-26 DIAGNOSIS — R110 Nausea: Secondary | ICD-10-CM | POA: Diagnosis not present

## 2018-11-26 DIAGNOSIS — M797 Fibromyalgia: Secondary | ICD-10-CM | POA: Diagnosis not present

## 2018-11-29 DIAGNOSIS — H40013 Open angle with borderline findings, low risk, bilateral: Secondary | ICD-10-CM | POA: Diagnosis not present

## 2018-11-29 DIAGNOSIS — H04123 Dry eye syndrome of bilateral lacrimal glands: Secondary | ICD-10-CM | POA: Diagnosis not present

## 2018-11-29 DIAGNOSIS — Z961 Presence of intraocular lens: Secondary | ICD-10-CM | POA: Diagnosis not present

## 2018-11-29 DIAGNOSIS — H35371 Puckering of macula, right eye: Secondary | ICD-10-CM | POA: Diagnosis not present

## 2018-12-25 ENCOUNTER — Other Ambulatory Visit: Payer: Self-pay

## 2018-12-25 ENCOUNTER — Other Ambulatory Visit (INDEPENDENT_AMBULATORY_CARE_PROVIDER_SITE_OTHER): Payer: PPO

## 2018-12-25 DIAGNOSIS — E2749 Other adrenocortical insufficiency: Secondary | ICD-10-CM | POA: Diagnosis not present

## 2018-12-25 LAB — COMPREHENSIVE METABOLIC PANEL
ALT: 15 U/L (ref 0–35)
AST: 22 U/L (ref 0–37)
Albumin: 4.4 g/dL (ref 3.5–5.2)
Alkaline Phosphatase: 28 U/L — ABNORMAL LOW (ref 39–117)
BUN: 30 mg/dL — ABNORMAL HIGH (ref 6–23)
CO2: 27 mEq/L (ref 19–32)
Calcium: 10.3 mg/dL (ref 8.4–10.5)
Chloride: 103 mEq/L (ref 96–112)
Creatinine, Ser: 0.98 mg/dL (ref 0.40–1.20)
GFR: 55.91 mL/min — ABNORMAL LOW (ref >60.00)
Glucose, Bld: 93 mg/dL (ref 70–99)
Potassium: 4.4 mEq/L (ref 3.5–5.1)
Sodium: 138 mEq/L (ref 135–145)
Total Bilirubin: 0.5 mg/dL (ref 0.2–1.2)
Total Protein: 6.6 g/dL (ref 6.0–8.3)

## 2018-12-28 ENCOUNTER — Ambulatory Visit (INDEPENDENT_AMBULATORY_CARE_PROVIDER_SITE_OTHER): Payer: PPO | Admitting: Endocrinology

## 2018-12-28 ENCOUNTER — Encounter: Payer: Self-pay | Admitting: Endocrinology

## 2018-12-28 ENCOUNTER — Other Ambulatory Visit: Payer: Self-pay

## 2018-12-28 VITALS — BP 128/76

## 2018-12-28 DIAGNOSIS — E2749 Other adrenocortical insufficiency: Secondary | ICD-10-CM | POA: Diagnosis not present

## 2018-12-28 DIAGNOSIS — G903 Multi-system degeneration of the autonomic nervous system: Secondary | ICD-10-CM

## 2018-12-28 DIAGNOSIS — I951 Orthostatic hypotension: Secondary | ICD-10-CM

## 2018-12-28 NOTE — Progress Notes (Signed)
Patient ID: Katrina Rivera, female   DOB: 01-05-1946, 73 y.o.   MRN: [id number]   Subjective:       Today's office visit was provided via telemedicine using a telephone call to the patient Patient has been explained the limitations of evaluation and management by telemedicine and the availability of in person appointments.  The patient understood the limitations and agreed to proceed. Patient also understood that the telehealth visit is billable. . Location of the patient: Home . Location of the provider: Office Only the patient and myself were participating in the encounter   Chief complaint: Follow-up of various issues   PROBLEM 1: Dysautonomic orthostatic hypotension   PAST history: She has had long-standing problems with orthostatic hypotension and also hyponatremia. Has been diagnosed with dysautonomia and has multiple other problems related to autonomic neuropathy.   She has been on Florinef since 2004 previously taking 3 tablets daily along with 5 tablets of potassium which had previously controlled her symptoms well . Because of persistent orthostatic symptoms she had been tried on midodrine on 05/28/12 but this was later stopped when blood pressure increased.  She  had medication adjustments done frequently over the last year for regulating her blood pressure.  RECENT history:   She has  been on variable doses of Florinef long-term, as much as 3 a day,   More recently has required lower doses, taking only 1 tablet daily since 05/2017 She takes this in the mornings  She has not been checking blood pressure readings on her own regularly  She just started using a new blood pressure meter and has used it today She says her blood pressure was 141/80 sitting and 128/76 standing Systolic readings at other doctor's office have been about 140+ sitting  Previously would have lightheadedness and dizziness when just getting up from sitting and trying to walk No symptoms  currently  PROBLEM 2: Hypokalemia previously from Florinef: With taking lower doses of Florinef recently she has not required any potassium supplement    Lab Results  Component Value Date   CREATININE 0.98 12/25/2018   BUN 30 (H) 12/25/2018   NA 138 12/25/2018   K 4.4 12/25/2018   CL 103 12/25/2018   CO2 27 12/25/2018     Adrenal Insufficiency:   This is secondary to pituitary dysfunction  Prior testing included Cortrosyn test showing stimulated level of 15.7, baseline 3.9.  Also confirmed by 24 hour urine free cortisol which was only 3.0.  Although previously she had tolerated oral hydrocortisone and small doses with improvement in her symptoms she did not continue this long-term She was again symptomatic with weight loss, decreased appetite, nausea and also diarrhea Cortrosyn stimulation test on 03/12/14 showed baseline cortisol level of 0.3 and post injection of 1.1 only  She had GI side effects from hydrocortisone and prednisone and was in the past using hydrocortisone injections using insulin syringe   RECENT HISTORY:  She is continuing methylprednisolone without any nausea; previously was on hydrocortisone injections because of GI intolerance with oral supplements   She is taking 4 mg at breakfast or early morning She is taking half tablet in the late afternoon  She was concerned about weight gain on the previous visit and she was tried on 3 mg dosage in the morning but with this she started having aching and some weakness and had to stay on 4 mg  Her weight has been gradually going up and she may have gained 2 to 3 pounds  since her last visit in July  Energy level is generally fairly good  No weakness, lightheadedness, significant nausea Previous exams have not shown any cushingoid features on exam  Sodium and potassium normal  Wt Readings from Last 3 Encounters:  11/01/18 124 lb 8 oz (56.5 kg)  04/24/18 118 lb (53.5 kg)  01/23/18 119 lb 9.6 oz (54.3 kg)      General Endocrinology:   HYPERCALCEMIA:  She has had intermittent hypercalcemia since at least 2018, previously only temporarily associated with renal dysfunction Her PTH level was 31 previously and subsequently 23  However her calcium is variable, now usually upper normal  She is not on calcium supplements  She does have osteoporosis as of 2016, taking Prolia from orthopedic surgeon She was told by orthopedic surgeon today vitamin D  Lab Results  Component Value Date   PTH 23 02/06/2017   CALCIUM 10.3 12/25/2018   PHOS 1.9 (L) 07/23/2006     Allergies as of 12/28/2018   No Known Allergies     Medication List       Accurate as of December 28, 2018 10:20 AM. If you have any questions, ask your nurse or doctor.        alfuzosin 10 MG 24 hr tablet Commonly known as: UROXATRAL Take 10 mg by mouth daily with breakfast.   Bentyl 10 MG capsule Generic drug: dicyclomine Take 10 mg by mouth 4 (four) times daily -  before meals and at bedtime. Take 1 tablet by mouth 30 minutes before meals and at bedtime.   cephALEXin 250 MG capsule Commonly known as: KEFLEX 250 mg See admin instructions. Take 1 tablet by mouth every other night at bedtime.   Ferrous Sulfate Dried 200 (65 Fe) MG Tabs Take 65 mg by mouth daily.   fludrocortisone 0.1 MG tablet Commonly known as: FLORINEF TAKE 2 TABLETS IN THE PM.   Horizant 300 MG Tbcr Generic drug: Gabapentin Enacarbil ER Take 2 tablets by mouth at bedtime. Take 1 tablet by mouth once daily at 5pm.   methylPREDNISolone 4 MG tablet Commonly known as: MEDROL TAKE 1 TABLET (4MG ) AT BREAKFAST AND 1/2 TABLET (2MG ) AT 4PM.   mirtazapine 15 MG tablet Commonly known as: REMERON Take 15 mg by mouth at bedtime.   multivitamin tablet Take 1 tablet by mouth daily.   promethazine 25 MG tablet Commonly known as: PHENERGAN Take 1 tablet (25 mg total) by mouth every 8 (eight) hours as needed for nausea.   SYSTANE OP Apply 2 drops to eye  daily as needed (for dry eyes). For both eyes   TYLENOL 8 HOUR ARTHRITIS PAIN PO Take 1-2 tablets by mouth daily.   VITAMIN D3 PO Take 1 tablet by mouth daily.       Review of Systems    Genitourinary: She has had difficulty emptying her bladder and is being treated by urologist   OSTEOPOROSIS: This is being treated by her orthopedic surgeon with Prolia  Previous bone density in 2016 showed the following: AP LUMBAR SPINE L1 through L4 Young Adult T-Score:  -2.4 LEFT FEMUR NECK: Young Adult T-Score: -3.1  Last bone density in 12/2016 has T score of -3.3 at the dual femur neck right and -2.3 for the L1-L3  She is now taking higher doses of extended release gabapentin for restless legs, insomnia, leg pains followed by neurologist She sleeps better and is not taking trazodone also    Objective:   Physical Exam  There were no vitals taken  for this visit.  Exam not done, patient is remote  Assessment:       Adrenal insufficiency:  She has long-standing secondary adrenal insufficiency with fairly typical symptoms at baseline and very low cortisol levels on the stimulation test As before she has been on methylprednisolone which she tolerates better than other oral steroids  She is on physiological doses using 4 mg in the morning and 2 mg in the evening She has had a tendency to weight gain but she has not been cushingoid Likely her weight gain is related to poor appetite and resolution of nausea most of the time She is not having any weakness or fatigue  Electrolytes normal   Orthostatic dysautonomic hypotension syndrome:   Her orthostatic blood pressure changes controlled with 0.1 mg Florinef She is nonsymptomatic and today her blood pressure is not low standing up However she does not check blood pressure regularly at home and encouraged her to do so  Her systolic blood pressure tends to be mildly increased at times sitting   Hypercalcemia: Calcium is upper  normal and consistent now  Since her PTH is usually over 20 may likely has mild hyperparathyroidism She is already being treated for osteoporosis  History of hypokalemia: Resolved and does not need any supplements with using lower doses of Florinef   Plan:      No change in regimen of steroid medications as discussed above  Follow-up in 6 months unless she has any new symptoms  Duration of telephone encounter = 8 minutes  Kourtney Montesinos  Note: This office note was prepared with Insurance underwriter. Any transcriptional errors that result from this process are unintentional.

## 2019-01-10 ENCOUNTER — Other Ambulatory Visit: Payer: Self-pay

## 2019-01-10 ENCOUNTER — Ambulatory Visit
Admission: RE | Admit: 2019-01-10 | Discharge: 2019-01-10 | Disposition: A | Discharge status: Home / Self Care | Payer: PPO | Source: Ambulatory Visit | Attending: Sports Medicine | Admitting: Sports Medicine

## 2019-01-10 DIAGNOSIS — Z780 Asymptomatic menopausal state: Secondary | ICD-10-CM | POA: Diagnosis not present

## 2019-01-10 DIAGNOSIS — M8588 Other specified disorders of bone density and structure, other site: Secondary | ICD-10-CM | POA: Diagnosis not present

## 2019-01-10 DIAGNOSIS — M810 Age-related osteoporosis without current pathological fracture: Secondary | ICD-10-CM | POA: Diagnosis not present

## 2019-02-13 ENCOUNTER — Other Ambulatory Visit: Payer: Self-pay | Admitting: Endocrinology

## 2019-02-26 DIAGNOSIS — I10 Essential (primary) hypertension: Secondary | ICD-10-CM | POA: Diagnosis not present

## 2019-02-26 DIAGNOSIS — J0100 Acute maxillary sinusitis, unspecified: Secondary | ICD-10-CM | POA: Diagnosis not present

## 2019-02-26 DIAGNOSIS — N183 Chronic kidney disease, stage 3 unspecified: Secondary | ICD-10-CM | POA: Diagnosis not present

## 2019-02-26 DIAGNOSIS — M797 Fibromyalgia: Secondary | ICD-10-CM | POA: Diagnosis not present

## 2019-02-26 DIAGNOSIS — R110 Nausea: Secondary | ICD-10-CM | POA: Diagnosis not present

## 2019-02-26 DIAGNOSIS — G4700 Insomnia, unspecified: Secondary | ICD-10-CM | POA: Diagnosis not present

## 2019-02-26 DIAGNOSIS — E2740 Unspecified adrenocortical insufficiency: Secondary | ICD-10-CM | POA: Diagnosis not present

## 2019-02-26 DIAGNOSIS — G259 Extrapyramidal and movement disorder, unspecified: Secondary | ICD-10-CM | POA: Diagnosis not present

## 2019-03-06 ENCOUNTER — Ambulatory Visit: Payer: PPO

## 2019-03-06 ENCOUNTER — Telehealth: Payer: Self-pay

## 2019-03-06 MED ORDER — HORIZANT 600 MG PO TBCR: 1.0000 | EXTENDED_RELEASE_TABLET | Freq: Every day | ORAL | 11 refills | Status: DC

## 2019-03-06 NOTE — Telephone Encounter (Signed)
Patient has been taking Horizant 300mg , two tablets at bedtime. She is requesting new rx for Horizant 600mg , one tablet at bedtime. She will not have to swallow as many pills.

## 2019-03-06 NOTE — Telephone Encounter (Signed)
1) Medication(s) Requested (by name): Gabapentin Enacarbil ER (HORIZANT) 300 MG TBCR   2) Pharmacy of Choice: [facility] - [place], [place] - [address]  [address] [place] [address]   3) Special Requests:  Patient would like to know if she can get more refills/fill for the year as it would save her money having all her refills in once prescription

## 2019-03-15 ENCOUNTER — Ambulatory Visit: Payer: PPO | Attending: Internal Medicine

## 2019-03-15 ENCOUNTER — Ambulatory Visit: Payer: PPO

## 2019-03-15 DIAGNOSIS — Z23 Encounter for immunization: Secondary | ICD-10-CM

## 2019-03-15 NOTE — Progress Notes (Signed)
   Covid-19 Vaccination Clinic  Name:  Katrina Rivera    MRN: [id number] DOB: 11-Sep-1945  03/15/2019  Katrina Rivera was observed post Covid-19 immunization for 15 minutes without incidence. She was provided with Vaccine Information Sheet and instruction to access the V-Safe system.   Katrina Rivera was instructed to call 911 with any severe reactions post vaccine: Marland Kitchen Difficulty breathing  . Swelling of your face and throat  . A fast heartbeat  . A bad rash all over your body  . Dizziness and weakness    Immunizations Administered    Name Date Dose VIS Date Route   Pfizer COVID-19 Vaccine 03/15/2019 12:07 PM 0.3 mL 01/18/2019 Intramuscular   Manufacturer: [employer], [employer]   Lot: [id number]   NDC: [address]-8

## 2019-03-17 ENCOUNTER — Ambulatory Visit: Payer: PPO

## 2019-03-28 DIAGNOSIS — D509 Iron deficiency anemia, unspecified: Secondary | ICD-10-CM | POA: Diagnosis not present

## 2019-03-28 DIAGNOSIS — I10 Essential (primary) hypertension: Secondary | ICD-10-CM | POA: Diagnosis not present

## 2019-03-28 DIAGNOSIS — M1990 Unspecified osteoarthritis, unspecified site: Secondary | ICD-10-CM | POA: Diagnosis not present

## 2019-03-28 DIAGNOSIS — F339 Major depressive disorder, recurrent, unspecified: Secondary | ICD-10-CM | POA: Diagnosis not present

## 2019-03-28 DIAGNOSIS — D500 Iron deficiency anemia secondary to blood loss (chronic): Secondary | ICD-10-CM | POA: Diagnosis not present

## 2019-03-28 DIAGNOSIS — D508 Other iron deficiency anemias: Secondary | ICD-10-CM | POA: Diagnosis not present

## 2019-03-28 DIAGNOSIS — F329 Major depressive disorder, single episode, unspecified: Secondary | ICD-10-CM | POA: Diagnosis not present

## 2019-04-09 ENCOUNTER — Ambulatory Visit: Payer: PPO | Attending: Internal Medicine

## 2019-04-09 DIAGNOSIS — Z23 Encounter for immunization: Secondary | ICD-10-CM | POA: Insufficient documentation

## 2019-04-09 NOTE — Progress Notes (Signed)
   Covid-19 Vaccination Clinic  Name:  Katrina Rivera    MRN: [id number] DOB: 1945-07-25  04/09/2019  Katrina Rivera was observed post Covid-19 immunization for 15 minutes without incident. She was provided with Vaccine Information Sheet and instruction to access the V-Safe system.   Katrina Rivera was instructed to call 911 with any severe reactions post vaccine: Marland Kitchen Difficulty breathing  . Swelling of face and throat  . A fast heartbeat  . A bad rash all over body  . Dizziness and weakness   Immunizations Administered    Name Date Dose VIS Date Route   Pfizer COVID-19 Vaccine 04/09/2019 12:46 PM 0.3 mL 01/18/2019 Intramuscular   Manufacturer: [employer], [employer]   Lot: [id number]   NDC: [address]-0

## 2019-04-22 ENCOUNTER — Other Ambulatory Visit (INDEPENDENT_AMBULATORY_CARE_PROVIDER_SITE_OTHER): Payer: PPO

## 2019-04-22 ENCOUNTER — Other Ambulatory Visit: Payer: Self-pay

## 2019-04-22 DIAGNOSIS — E2749 Other adrenocortical insufficiency: Secondary | ICD-10-CM

## 2019-04-22 LAB — COMPREHENSIVE METABOLIC PANEL
ALT: 17 U/L (ref 0–35)
AST: 23 U/L (ref 0–37)
Albumin: 4 g/dL (ref 3.5–5.2)
Alkaline Phosphatase: 30 U/L — ABNORMAL LOW (ref 39–117)
BUN: 22 mg/dL (ref 6–23)
CO2: 29 mEq/L (ref 19–32)
Calcium: 10 mg/dL (ref 8.4–10.5)
Chloride: 102 mEq/L (ref 96–112)
Creatinine, Ser: 0.9 mg/dL (ref 0.40–1.20)
GFR: 61.62 mL/min (ref >60.00)
Glucose, Bld: 112 mg/dL — ABNORMAL HIGH (ref 70–99)
Potassium: 3.7 mEq/L (ref 3.5–5.1)
Sodium: 139 mEq/L (ref 135–145)
Total Bilirubin: 0.4 mg/dL (ref 0.2–1.2)
Total Protein: 6.5 g/dL (ref 6.0–8.3)

## 2019-04-22 LAB — VITAMIN D 25 HYDROXY (VIT D DEFICIENCY, FRACTURES): VITD: 72.56 ng/mL (ref 30.00–100.00)

## 2019-04-24 ENCOUNTER — Ambulatory Visit (INDEPENDENT_AMBULATORY_CARE_PROVIDER_SITE_OTHER): Payer: PPO | Admitting: Endocrinology

## 2019-04-24 ENCOUNTER — Encounter: Payer: Self-pay | Admitting: Endocrinology

## 2019-04-24 ENCOUNTER — Other Ambulatory Visit: Payer: Self-pay

## 2019-04-24 DIAGNOSIS — E2749 Other adrenocortical insufficiency: Secondary | ICD-10-CM | POA: Diagnosis not present

## 2019-04-24 DIAGNOSIS — G903 Multi-system degeneration of the autonomic nervous system: Secondary | ICD-10-CM

## 2019-04-24 DIAGNOSIS — I951 Orthostatic hypotension: Secondary | ICD-10-CM

## 2019-04-24 NOTE — Progress Notes (Signed)
Patient ID: Katrina Rivera, female   DOB: 1945-06-12, 74 y.o.   MRN: [id number]   Subjective:    I connected with the above-named patient by video enabled application and verified that I am speaking with the correct person. The patient was explained the limitations of evaluation and management by telemedicine and the availability of in person appointments.  Patient also understood that there may be a patient responsible charge related to this service . Location of the patient: Patient's home . Location of the provider: Physician office Only the patient and myself were participating in the encounter The patient understood the above statements and agreed to proceed.    Chief complaint: Follow-up of various issues   PROBLEM 1: Dysautonomic orthostatic hypotension   PAST history: She has had long-standing problems with orthostatic hypotension and also hyponatremia. Has been diagnosed with dysautonomia and has multiple other problems related to autonomic neuropathy.   She has been on Florinef since 2004 previously taking 3 tablets daily along with 5 tablets of potassium which had previously controlled her symptoms well . Because of persistent orthostatic symptoms she had been tried on midodrine on 05/28/12 but this was later stopped when blood pressure increased.  She  had medication adjustments done frequently over the last year for regulating her blood pressure.  RECENT history:   She has  been on variable doses of Florinef long-term, as much as 3 a day,   More recently has required lower doses, taking only 1 tablet daily since 05/2017 She takes this in the morning  As before she has not checked blood pressure readings regularly and only a couple of readings recently  Most recent blood pressure 147/79 sitting and 128/73 standing  Blood pressure readings at another facility was 160/83 done in January  He did not complain of any further lightheadedness and dizziness when  getting up from sitting and trying to walk Also no ankle swelling  PROBLEM 2: Hypokalemia previously from Florinef: With taking lower doses of Florinef she has not required any potassium supplement    Lab Results  Component Value Date   CREATININE 0.90 04/22/2019   BUN 22 04/22/2019   NA 139 04/22/2019   K 3.7 04/22/2019   CL 102 04/22/2019   CO2 29 04/22/2019     Adrenal Insufficiency:   This is secondary to pituitary dysfunction  Prior testing included Cortrosyn test showing stimulated level of 15.7, baseline 3.9.  Also confirmed by 24 hour urine free cortisol which was only 3.0.  Although previously she had tolerated oral hydrocortisone and small doses with improvement in her symptoms she did not continue this long-term She was again symptomatic with weight loss, decreased appetite, nausea and also diarrhea Cortrosyn stimulation test on 03/12/14 showed baseline cortisol level of 0.3 and post injection of 1.1 only  She had GI side effects from hydrocortisone and prednisone and was in the past using hydrocortisone injections using insulin syringe   RECENT HISTORY:  She is tolerating methylprednisolone without any nausea; previously was on hydrocortisone injections because of GI intolerance with oral supplements   She is taking 4 mg in the early morning usually She is taking half tablet in the late afternoon around 5 PM  She does not think she has gained any weight although she has not checked this at home  Overall she feels fairly good with only mild fatigue She is getting a little more active taking care of grandchildren  Appetite is fair but no nausea  Sodium  and potassium normal  Wt Readings from Last 3 Encounters:  11/01/18 124 lb 8 oz (56.5 kg)  04/24/18 118 lb (53.5 kg)  01/23/18 119 lb 9.6 oz (54.3 kg)     General Endocrinology:   HYPERCALCEMIA:  She has had intermittent hypercalcemia since at least 2018, previously only temporarily associated with renal  dysfunction Her PTH level was 31 previously and subsequently 23  However her calcium is variable, now usually upper normal  She is not on calcium supplements  She does have osteoporosis as of 2016, taking Prolia from orthopedic surgeon She was told by orthopedic surgeon today vitamin D  Lab Results  Component Value Date   PTH 23 02/06/2017   CALCIUM 10.0 04/22/2019   PHOS 1.9 (L) 07/23/2006     Allergies as of 04/24/2019   No Known Allergies     Medication List       Accurate as of April 24, 2019 10:03 AM. If you have any questions, ask your nurse or doctor.        alfuzosin 10 MG 24 hr tablet Commonly known as: UROXATRAL Take 10 mg by mouth daily with breakfast.   Bentyl 10 MG capsule Generic drug: dicyclomine Take 10 mg by mouth 4 (four) times daily -  before meals and at bedtime. Take 1 tablet by mouth 30 minutes before meals and at bedtime.   cephALEXin 250 MG capsule Commonly known as: KEFLEX 250 mg See admin instructions. Take 1 tablet by mouth every other night at bedtime.   Ferrous Sulfate Dried 200 (65 Fe) MG Tabs Take 65 mg by mouth daily.   fludrocortisone 0.1 MG tablet Commonly known as: FLORINEF TAKE 2 TABLETS IN THE PM. What changed: See the new instructions.   Horizant 600 MG Tbcr Generic drug: Gabapentin Enacarbil Take 1 tablet (600 mg total) by mouth at bedtime.   methylPREDNISolone 4 MG tablet Commonly known as: MEDROL TAKE 1 TABLET (4MG ) AT BREAKFAST AND 1/2 TABLET (2MG ) AT 4PM.   mirtazapine 15 MG tablet Commonly known as: REMERON Take 15 mg by mouth at bedtime.   multivitamin tablet Take 1 tablet by mouth daily.   promethazine 25 MG tablet Commonly known as: PHENERGAN Take 1 tablet (25 mg total) by mouth every 8 (eight) hours as needed for nausea.   SYSTANE OP Apply 2 drops to eye daily as needed (for dry eyes). For both eyes   TYLENOL 8 HOUR ARTHRITIS PAIN PO Take 1-2 tablets by mouth daily.   VITAMIN D3 PO Take 1 tablet  by mouth daily.       Review of Systems    Genitourinary: She has had difficulty emptying her bladder and is being treated by urologist   OSTEOPOROSIS: This is being treated by her orthopedic surgeon with Prolia  Previous bone density in 2016 showed the following: AP LUMBAR SPINE L1 through L4 Young Adult T-Score:  -2.4 LEFT FEMUR NECK: Young Adult T-Score: -3.1  Last bone density in 12/20 showed improvement in her T-scores up to -2.7 at the right femur and 3.6radius Previously in 12/2016 has T score of -3.3 at the dual femur neck right and -2.3 for the L1-L3  Vitamin D deficiency: She is only on 1000 units vitamin D3 and with her levels in the upper normal range explained to her that this is adequate  Her glucose on the lab was nonfasting at 112 and reassured her that this is normal    Objective:   Physical Exam  There were no vitals  taken for this visit.  Exam not done, patient is remote  Assessment:       Adrenal insufficiency:  She has long-standing secondary adrenal insufficiency with fairly typical symptoms at baseline and very low cortisol levels on the stimulation test Symptoms consistently well controlled on methylprednisolone which she tolerates better than other oral steroids  She is on physiological doses using 4 mg in the morning and 2 mg in the evening She does not report any unusual weight gain  Electrolytes normal She will continue the same doses long-term   Orthostatic dysautonomic hypotension syndrome:   Her orthostatic blood pressure drop has been controlled with 0.1 mg Florinef Although her systolic pressure does go down 20 mm on standing up her recent standing blood pressure is in the 120s  Advised her to check her blood pressure more regularly, likely once a week with standing readings  Consider reducing Florinef if standing blood pressure and also sitting blood pressure readings are relatively higher   Hypercalcemia: Calcium is upper  normal and consistent  Likely has mild hyperparathyroidism  She is already being treated for osteoporosis with Prolia by her orthopedic specialist Recent T-scores have been improving although still in the osteoporosis category  History of hypokalemia: Potassium is consistently back to normal and does not need any supplements with using lower doses of Florinef   Plan:      Discussed management of all her problems as above  She will follow-up in 4 months  Gillermo Poch Dwyane Dee  Note: This office note was prepared with Estate agent. Any transcriptional errors that result from this process are unintentional.

## 2019-05-01 DIAGNOSIS — E559 Vitamin D deficiency, unspecified: Secondary | ICD-10-CM | POA: Diagnosis not present

## 2019-05-01 DIAGNOSIS — M810 Age-related osteoporosis without current pathological fracture: Secondary | ICD-10-CM | POA: Diagnosis not present

## 2019-05-02 ENCOUNTER — Encounter: Payer: Self-pay | Admitting: Neurology

## 2019-05-02 ENCOUNTER — Ambulatory Visit: Payer: PPO | Admitting: Neurology

## 2019-05-02 ENCOUNTER — Other Ambulatory Visit: Payer: Self-pay

## 2019-05-02 VITALS — BP 166/80 | HR 72 | Temp 97.0°F | Ht 60.0 in | Wt 116.0 lb

## 2019-05-02 DIAGNOSIS — G2581 Restless legs syndrome: Secondary | ICD-10-CM | POA: Diagnosis not present

## 2019-05-02 DIAGNOSIS — G8929 Other chronic pain: Secondary | ICD-10-CM | POA: Insufficient documentation

## 2019-05-02 DIAGNOSIS — M549 Dorsalgia, unspecified: Secondary | ICD-10-CM | POA: Insufficient documentation

## 2019-05-02 DIAGNOSIS — G35 Multiple sclerosis: Secondary | ICD-10-CM

## 2019-05-02 MED ORDER — HORIZANT 600 MG PO TBCR: 1.0000 | EXTENDED_RELEASE_TABLET | Freq: Every day | ORAL | 3 refills | Status: DC

## 2019-05-02 NOTE — Progress Notes (Signed)
I have reviewed and agreed above plan. 

## 2019-05-02 NOTE — Patient Instructions (Addendum)
It was nice to meet you today! Continue taking the Horizant, sent as 90-day refill  See you in 1 year

## 2019-05-02 NOTE — Progress Notes (Signed)
PATIENT: Katrina Rivera DOB: [birth year]  REASON FOR VISIT: follow up HISTORY FROM: patient  HISTORY OF PRESENT ILLNESS: Today 05/02/19  HISTORY HISTORY OF PRESENT ILLNESS:  Katrina Rivera is a very pleasant 74 year-old right-handed woman, with relapsing remitting multiple sclerosis, was treated with Betaseron 4 -5 years, but could not tolerate the side effect, now is not on any immunomodulation therapy. neuropathic pain of bilateral lower extremities.  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   She has baseline gait difficulty, she fell in May 2013, fractured both elbows and her left knee cap. She intermittently has been using a cane or walker.  She could not tolerate Requip because of nausea. She has been on gabapentin 300 mg 3 bid. She gained significant weight gain when on Lyrica, and therefore was switched back to gabapentin. She has orthostatic hypotension, and is on midodrine 2.5 mg and fludrocortisone 0.1 mg 1 in AM and 2 at night, and she is also on 20 mg of hydrocortisone.  She complains of bilateral lower extremity burning stinging sensation, getting worse when she sits still, difficulty falling into sleep, she has the urge to move because of bilateral extremity discomfort, has tried Requip, could not tolerate it because of GI side effects, Neuprol patch does not work either, she also tried Elavil, Cymbalta in the past, could not tolerate it due to side effect.   Jan 01/2014: She came in  urgently for acute worsening of her generalized condition since her lumbar decompression surgery in January 24 2013 by Dr. Marikay Alar, prior to surgery, she suffered left-sided low back pain, radiating pain to bilateral lower extremity, left is much worse than her right side, there was left L5-S1 extraforaminal herniated nucleus pulposus with left L5 radiculopathy she had left L5-S1 extraforaminal microdiscectomy utilizing microscopic dissection.  She had overnight stay at the hospital the day after surgery, she fell when trying to get up using bathroom, landed on her left side, surgery did help her lower back pain, radiating pain to her left leg, but she complained of worsening bilateral lower extremity deep achy pain, worsening gait difficulty, she has not used walker for 3 years, now she began to use her walker, she felt her skin is so tight at bilateral lower extremity, left leg swelling, also has difficulty swallowing, blurred vision, worsening fatigue, difficulty concentration, she has not had MRI evaluation for her multiple sclerosis for many years, is not on any immunomodulation therapy   UPDATE Mar 01, 2014:She came in with a list of complains, much worse compared to presurgical level. She complains of blurred vision, difficulty sleeping, difficulty concentrating, dizziness, worsening gait difficulty, The most bothersome symptoms are bilateral lower extremity pain, constant, knife cutting pain, left worse than right Left leg showed no DVT on doppler study. She had 15 LB weight gain over one month, more difficulty sleepy, body shaking, more difficulty sleepy.  She has tried fentanyl patch 50 mcg, Trileptal, Neurontin without helping her symptoms .She is going to be seen by her surgeon Dr. Yetta Barre in 3 days  We have reviewed MRI of  the brain cervical lumbar spine together, these were done at [facility] in January 2015 MRI lumbar: L5-S1: disc bulging and facet hypertrophy, status post  microdiscectomy on the left, with expected post-surgical changes. Multi-level facet hypertrophy, with no spinal stenosis or foraminal narrowing.  MRI cervical: Possible small chronic demyelinating plaque at C6 on the right side. No abnormal enhancing lesions  MRI brain: Multiple supratentorial and 1 infratentorial chronic demyelinating plaques. No acute plaques. Mild cerebellar tonsillar ectopia there was no significant change in the brain lesions,   UPDATE 08/22/13  Patient returns for follow up.Her biggest complaint today is restless legs. She has never tried Neupro. She has had multiple trials of medications for her chronic pain. She is currently taking fentanyl, Trileptal, Cymbalta, lidocaine gel  UPDATE Feb 27 2014:She is not on any long-term immunomodulation therapy for her relapsing remitting multiple sclerosis, neurological deficit has been fairly stable, she has baseline gait difficulty, the most bothersome symptoms for her is low back pain, bilateral lower extremity deep achy pain, radiating pain from left lower back to her left leg,  She was recently found to have very low cortisol level, under endocrinologist Dr. Remus Blake care, UPDATE July 23 2014: She had lumbar decompression by Dr. Marikay Alar in Jun 18 2014, which did help her low back pain, but now she experienced worsening left lower extremity spasm, left calf spasm so hard, as if a knot was tied, difficulty walking, she was giving a steroid package, no help, She complained excessive weight gain with Lyrica, Neurontin did not help,  UPDATE July 27th 2016: She came in with a list of complaints, continue complains of unbalanced, staggering, generalized weakness, numbness tingling at bilateral lower extremity, worsening at her left leg, frequent urination, difficulty concentrating, difficulty with multitasking  UPDATE Oct 22 2014: She had a syncope episode on October 08 2014, was taken to the emergency room, have reviewed ED record,  blood pressure was 200/60s laboratory showed normal CBC, CMP, this happened in the setting of missing her hydrocortisone dose, because of nausea, lack of appetite, dehydration, she is taking hydrocortisone because of Addison's disease, now she is on hydrocortisone shots,She is back to her baseline now, mild gait difficulty, also complains of anxiety, constant bilateral lower chamber paresthesia, she wants to get off Cymbalta 30 mg daily, worry about the long-term side effect. She also complains of bilateral neck, shoulder or upper extremity itching,  UPDATE Apr 05 2016: She had a spinal stimulator placement by neurosurgeon Dr. Buck Mam in January third 2018, which has helped her low back pain,and leg pain.  She had sinus infection, was treated with Zpack and nausea, She has iron infusion in Feb 2018, She noticed that she has worsening gait abnormality, She also has diffuse body achy pain.She has tried Flexeril without benefit, previously tried and failed gabapentin, Cymbalta, fentanyl patch,  CPK was normal 39 WBC showed hemoglobin of 10 point 8, which was mildly decreased normal free T4, TSH, CMP, creat 1.1, normal B12,  UPDATE Sept 24th 2020: She is overall stable, ambulate without assistant, but with mild unsteady gait, she had a spinal stimulator placement in 2018, is no longer a candidate for MRI, I personally reviewed previous MRIs in 2015, MRI of the brain, multiple supratentorium, and one infratentorial chronic demyelinating plaque, no acute abnormality.  MRI of cervical spine, possible small chronic demyelinating plaque at right C6.  MRI of lumbar spine, multilevel degenerative changes, postsurgical changes L5-S1 with evidence of left microdiscectomy.  She is now taking  Horizant 300 mg every night, which does help her restless leg symptoms some, but at the end of the day, she complains of whole body tightness,  Laboratory evaluation July 2020, normal CMP, TSH, free T4, BMP with  GFR of 56, vitamin D of 87,  Update May 02, 2019 SS: She is here with her husband. She has spinal stimulator in place since 2018, no longer candidate for MRI. In the afternoon, she may feel tightness in her trunk, feels like her shirt is too tight. Takes Horizant  At 5 pm, takes OTC sleep aid, along with remeron. She is now sleeping through the night, pain isn't waking her. Walking is doing well. She feels she is stable overall. No assistive device. No falls. Keeps grandkids a few days a week. Neuropathy pain is mostly at night, not so much during the day. Feels overall stable.   REVIEW OF SYSTEMS: Out of a complete 14 system review of symptoms, the patient complains only of the following symptoms, and all other reviewed systems are negative.  N/A  ALLERGIES: No Known Allergies  HOME MEDICATIONS: Outpatient Medications Prior to Visit  Medication Sig Dispense Refill  . Acetaminophen (TYLENOL 8 HOUR ARTHRITIS PAIN PO) Take 1-2 tablets by mouth daily.    Marland Kitchen alfuzosin (UROXATRAL) 10 MG 24 hr tablet Take 10 mg by mouth daily with breakfast.    . B Complex Vitamins (VITAMIN B COMPLEX PO) Take by mouth.    . cephALEXin (KEFLEX) 250 MG capsule 250 mg See admin instructions. Take 1 tablet by mouth every other night at bedtime.    . Cholecalciferol (VITAMIN D3 PO) Take 1 tablet by mouth daily.    Marland Kitchen dicyclomine (BENTYL) 10 MG capsule Take 10 mg by mouth 4 (four) times daily -  before meals and at bedtime. Take 1 tablet by mouth 30 minutes before meals and at bedtime.    . Ferrous Sulfate Dried 200 (65 FE) MG TABS Take 65 mg by mouth daily.    . fludrocortisone (FLORINEF) 0.1 MG tablet TAKE 2 TABLETS IN THE PM. (Patient taking differently: Take 1 tablet by mouth daily in the morning.) 180 tablet 0  . Gabapentin Enacarbil (HORIZANT) 600 MG TBCR Take 1 tablet (600 mg total) by mouth at bedtime. 30 tablet 11  . methylPREDNISolone (MEDROL) 4 MG tablet TAKE 1 TABLET (4MG ) AT BREAKFAST AND 1/2 TABLET (2MG ) AT  4PM. 135 tablet 0  . mirtazapine (REMERON) 15 MG tablet Take 15 mg by mouth at bedtime.    . Multiple Vitamin (MULTIVITAMIN) tablet Take 1 tablet by mouth daily.    Glycol-Propyl Glycol (SYSTANE OP) Apply 2 drops to eye daily as needed (for dry eyes). For both eyes    . promethazine (PHENERGAN) 25 MG tablet Take 1 tablet (25 mg total) by mouth every 8 (eight) hours as needed for nausea. 15 tablet 0   No facility-administered medications prior to visit.    PAST MEDICAL HISTORY: Past Medical History:  Diagnosis Date  . Addison's disease (HCC)    takes Solu Cortef daily  . Anemia    takes Ferrous Sulfate daily  . Anxiety    takes Xanax nightly  . Arthritis   . Chronic back pain    stenosis  . Depression    takes Cymbalta daily  . Dizziness    if b/p drops   . Fibromyalgia   . Fibromyalgia   . GERD (gastroesophageal reflux disease)   . Headache(784.0)   . History of blood transfusion  no abnormal reaction noted  . History of bronchitis    many yrs ago   . Hypokalemia    takes Potassium daily  . Hypotension   . Hypotension    takes Florinef daily  . IBS (irritable bowel syndrome)    takes Electronics engineer daily  . Insomnia    takes Trazodone nightly  . Joint pain   . Multiple sclerosis ([place])    doesn't take any meds  . Multiple sclerosis ([place])   . Nocturia   . Osteoporosis   . Peripheral neuropathy   . Restless leg syndrome   . Seasonal allergies    takes Zyrtec daily;uses Flonase daily as needed  . Syncope   . Urinary frequency    takes Flomax daily  . Weakness    numbness and tingling    PAST SURGICAL HISTORY: Past Surgical History:  Procedure Laterality Date  . ABDOMINAL HYSTERECTOMY  1988  . APPENDECTOMY  1988  . CESAREAN SECTION  1973/1977   x2  . CHOLECYSTECTOMY  1997  . COLECTOMY  1990  . COLONOSCOPY    . ESOPHAGOGASTRODUODENOSCOPY    . EYE SURGERY     bilateral - /w IOL  . FRACTURE SURGERY Right    rods and screws  . LUMBAR  LAMINECTOMY/DECOMPRESSION MICRODISCECTOMY Left 01/24/2013   Procedure: LUMBAR FIVE TO SACRAL ONE LUMBAR LAMINECTOMY/DECOMPRESSION MICRODISCECTOMY 1 LEVEL;  Surgeon: Eustace Moore, MD;  Location: [facility];  Service: Neurosurgery;  Laterality: Left;  Marland Kitchen MAXIMUM ACCESS (MAS)POSTERIOR LUMBAR INTERBODY FUSION (PLIF) 1 LEVEL N/A 06/18/2014   Procedure: MAXIMUM ACCESS SURGERY POSTERIOR LUMBAR INTERBODY FUSION LUMBAR FIVE TO SACRAL ONE ;  Surgeon: Eustace Moore, MD;  Location: [facility];  Service: Neurosurgery;  Laterality: N/A;    FAMILY HISTORY: Family History  Problem Relation Age of Onset  . Hypertension Mother   . Stroke Mother   . Heart attack Father   . Tremor Brother     SOCIAL HISTORY: Social History   Socioeconomic History  . Marital status: Married    Spouse name: [name]  . Number of children: 2  . Years of education: 71  . Highest education level: Not on file  Occupational History    Employer: DISABLED    Comment: Disabled  Tobacco Use  . Smoking status: Never Smoker  . Smokeless tobacco: Never Used  Substance and Sexual Activity  . Alcohol use: No  . Drug use: No  . Sexual activity: Not on file  Other Topics Concern  . Not on file  Social History Narrative   Pt lives at home with spouse. ([name])   Caffeine Use: 1 cups daily.   Right handed.   Disabled.   Education - high school   Patient has two adult children.   Social Determinants of Health   Financial Resource Strain:   . Difficulty of Paying Living Expenses:   Food Insecurity:   . Worried About Charity fundraiser in the Last Year:   . Arboriculturist in the Last Year:   Transportation Needs:   . Film/video editor (Medical):   Marland Kitchen Lack of Transportation (Non-Medical):   Physical Activity:   . Days of Exercise per Week:   . Minutes of Exercise per Session:   Stress:   . Feeling of Stress :   Social Connections:   . Frequency of Communication with Friends and Family:   . Frequency of Social  Gatherings with Friends and Family:   . Attends Religious Services:   .  Active Member of Clubs or Organizations:   . Attends Banker Meetings:   Marland Kitchen Marital Status:   Intimate Partner Violence:   . Fear of Current or Ex-Partner:   . Emotionally Abused:   Marland Kitchen Physically Abused:   . Sexually Abused:       PHYSICAL EXAM  Vitals:   05/02/19 1008  BP: (!) 166/80  Pulse: 72  Temp: (!) 97 F (36.1 C)  Weight: 116 lb (52.6 kg)  Height: 5' (1.524 m)   Body mass index is 22.65 kg/m.  Generalized: Well developed, in no acute distress   Neurological examination  Mentation: Alert oriented to time, place, history taking. Follows all commands speech and language fluent Cranial nerve II-XII: Pupils were equal round reactive to light. Extraocular movements were full, visual field were full on confrontational test. Facial sensation and strength were normal.  Head turning and shoulder shrug  were normal and symmetric. Motor: The motor testing reveals 5 over 5 strength of all 4 extremities.  Sensory: Sensory testing is intact to soft touch on all 4 extremities. No evidence of extinction is noted.  Coordination: Cerebellar testing reveals good finger-nose-finger and heel-to-shin bilaterally.  Gait and station: Rises up from seated position with pushoff, gait is mildly slow, noted to be slightly dragging the left leg, no assistive device Reflexes: Deep tendon reflexes are symmetric, but brisk patellars  DIAGNOSTIC DATA (LABS, IMAGING, TESTING) - I reviewed patient records, labs, notes, testing and imaging myself where available.  Lab Results  Component Value Date   WBC 8.5 02/10/2017   HGB 11.9 (L) 02/10/2017   HCT 36.5 02/10/2017   MCV 93.1 02/10/2017   PLT 284.0 02/10/2017      Component Value Date/Time   NA 139 04/22/2019 0921   NA 140 02/06/2017 1126   K 3.7 04/22/2019 0921   CL 102 04/22/2019 0921   CO2 29 04/22/2019 0921   GLUCOSE 112 (H) 04/22/2019 0921   BUN 22  04/22/2019 0921   BUN 22 02/06/2017 1126   CREATININE 0.90 04/22/2019 0921   CREATININE 0.85 11/06/2013 0840   CALCIUM 10.0 04/22/2019 0921   PROT 6.5 04/22/2019 0921   ALBUMIN 4.0 04/22/2019 0921   AST 23 04/22/2019 0921   ALT 17 04/22/2019 0921   ALKPHOS 30 (L) 04/22/2019 0921   BILITOT 0.4 04/22/2019 0921   GFRNONAA 54 (L) 02/06/2017 1126   GFRAA 62 02/06/2017 1126   No results found for: CHOL, HDL, LDLCALC, LDLDIRECT, TRIG, CHOLHDL No results found for: [id number] Lab Results  Component Value Date   VITAMINB12 1,190 (H) 03/10/2016   Lab Results  Component Value Date   TSH 2.21 08/21/2018      ASSESSMENT AND PLAN 74 y.o. year old female  has a past medical history of Addison's disease (HCC), Anemia, Anxiety, Arthritis, Chronic back pain, Depression, Dizziness, Fibromyalgia, Fibromyalgia, GERD (gastroesophageal reflux disease), Headache(784.0), History of blood transfusion, History of bronchitis, Hypokalemia, Hypotension, Hypotension, IBS (irritable bowel syndrome), Insomnia, Joint pain, Multiple sclerosis (HCC), Multiple sclerosis (HCC), Nocturia, Osteoporosis, Peripheral neuropathy, Restless leg syndrome, Seasonal allergies, Syncope, Urinary frequency, and Weakness. here with:  1.  Relapsing remitting multiple sclerosis -Overall, stable, has sensation of abdominal tightness in the afternoons, no other flare symptoms -Last MRIs in 2015, evidence of supratentorium, infratentorium, spinal cord involvement -Is no longer on immune modulating therapy -Not MRI candidate due to stimulator -Encourage exercise as tolerated  2.  Chronic low back pain, bilateral lower extremity pain, status post spinal stimulator placement in  January 2018  3.  Restless leg symptoms -Currently well controlled on medication -Continue Horizant 600 mg at bedtime, refilled 90-day supply for cost savings -Follow-up in 1 year or sooner if needed  I spent 20 minutes of face-to-face and non-face-to-face time  with patient.  This included previsit chart review, lab review, study review, order entry, electronic health record documentation, patient education.  Margie Ege, AGNP-C, DNP 05/02/2019, 10:13 AM [facility] [address], [address] [place], [place] [address] [phone]

## 2019-05-23 ENCOUNTER — Other Ambulatory Visit: Payer: Self-pay | Admitting: Endocrinology

## 2019-05-27 DIAGNOSIS — R110 Nausea: Secondary | ICD-10-CM | POA: Diagnosis not present

## 2019-05-27 DIAGNOSIS — D508 Other iron deficiency anemias: Secondary | ICD-10-CM | POA: Diagnosis not present

## 2019-05-27 DIAGNOSIS — F5101 Primary insomnia: Secondary | ICD-10-CM | POA: Diagnosis not present

## 2019-05-27 DIAGNOSIS — G35 Multiple sclerosis: Secondary | ICD-10-CM | POA: Diagnosis not present

## 2019-05-27 DIAGNOSIS — I10 Essential (primary) hypertension: Secondary | ICD-10-CM | POA: Diagnosis not present

## 2019-05-27 DIAGNOSIS — G259 Extrapyramidal and movement disorder, unspecified: Secondary | ICD-10-CM | POA: Diagnosis not present

## 2019-05-27 DIAGNOSIS — M797 Fibromyalgia: Secondary | ICD-10-CM | POA: Diagnosis not present

## 2019-05-27 DIAGNOSIS — E2740 Unspecified adrenocortical insufficiency: Secondary | ICD-10-CM | POA: Diagnosis not present

## 2019-06-10 ENCOUNTER — Other Ambulatory Visit: Payer: Self-pay | Admitting: Endocrinology

## 2019-06-25 DIAGNOSIS — N318 Other neuromuscular dysfunction of bladder: Secondary | ICD-10-CM | POA: Diagnosis not present

## 2019-06-25 DIAGNOSIS — N3000 Acute cystitis without hematuria: Secondary | ICD-10-CM | POA: Diagnosis not present

## 2019-08-09 ENCOUNTER — Other Ambulatory Visit: Payer: Self-pay | Admitting: Endocrinology

## 2019-08-19 ENCOUNTER — Other Ambulatory Visit: Payer: Self-pay | Admitting: Gastroenterology

## 2019-08-19 ENCOUNTER — Ambulatory Visit
Admission: RE | Admit: 2019-08-19 | Discharge: 2019-08-19 | Disposition: A | Discharge status: Home / Self Care | Payer: PPO | Source: Ambulatory Visit | Attending: Gastroenterology | Admitting: Gastroenterology

## 2019-08-19 DIAGNOSIS — J0110 Acute frontal sinusitis, unspecified: Secondary | ICD-10-CM | POA: Diagnosis not present

## 2019-08-19 DIAGNOSIS — R109 Unspecified abdominal pain: Secondary | ICD-10-CM | POA: Diagnosis not present

## 2019-08-19 DIAGNOSIS — Z981 Arthrodesis status: Secondary | ICD-10-CM | POA: Diagnosis not present

## 2019-08-19 DIAGNOSIS — K5900 Constipation, unspecified: Secondary | ICD-10-CM

## 2019-08-19 DIAGNOSIS — K6389 Other specified diseases of intestine: Secondary | ICD-10-CM | POA: Diagnosis not present

## 2019-08-26 ENCOUNTER — Other Ambulatory Visit: Payer: PPO

## 2019-08-29 ENCOUNTER — Ambulatory Visit: Payer: PPO | Admitting: Endocrinology

## 2019-09-05 DIAGNOSIS — D692 Other nonthrombocytopenic purpura: Secondary | ICD-10-CM | POA: Diagnosis not present

## 2019-09-05 DIAGNOSIS — R238 Other skin changes: Secondary | ICD-10-CM | POA: Diagnosis not present

## 2019-09-18 ENCOUNTER — Inpatient Hospital Stay: Payer: PPO

## 2019-09-18 ENCOUNTER — Inpatient Hospital Stay: Payer: PPO | Attending: Adult Health | Admitting: Adult Health

## 2019-09-18 ENCOUNTER — Other Ambulatory Visit: Payer: Self-pay

## 2019-09-18 ENCOUNTER — Encounter: Payer: Self-pay | Admitting: Adult Health

## 2019-09-18 VITALS — BP 173/70 | HR 65 | Temp 96.8°F | Resp 17 | Ht 60.0 in | Wt 111.1 lb

## 2019-09-18 DIAGNOSIS — G35 Multiple sclerosis: Secondary | ICD-10-CM | POA: Diagnosis not present

## 2019-09-18 DIAGNOSIS — R238 Other skin changes: Secondary | ICD-10-CM

## 2019-09-18 DIAGNOSIS — M797 Fibromyalgia: Secondary | ICD-10-CM | POA: Diagnosis not present

## 2019-09-18 DIAGNOSIS — R233 Spontaneous ecchymoses: Secondary | ICD-10-CM

## 2019-09-18 DIAGNOSIS — M129 Arthropathy, unspecified: Secondary | ICD-10-CM | POA: Insufficient documentation

## 2019-09-18 DIAGNOSIS — M810 Age-related osteoporosis without current pathological fracture: Secondary | ICD-10-CM | POA: Diagnosis not present

## 2019-09-18 DIAGNOSIS — E876 Hypokalemia: Secondary | ICD-10-CM | POA: Diagnosis not present

## 2019-09-18 DIAGNOSIS — K219 Gastro-esophageal reflux disease without esophagitis: Secondary | ICD-10-CM | POA: Diagnosis not present

## 2019-09-18 DIAGNOSIS — L909 Atrophic disorder of skin, unspecified: Secondary | ICD-10-CM | POA: Diagnosis not present

## 2019-09-18 DIAGNOSIS — F418 Other specified anxiety disorders: Secondary | ICD-10-CM | POA: Insufficient documentation

## 2019-09-18 DIAGNOSIS — L989 Disorder of the skin and subcutaneous tissue, unspecified: Secondary | ICD-10-CM | POA: Insufficient documentation

## 2019-09-18 DIAGNOSIS — Z79899 Other long term (current) drug therapy: Secondary | ICD-10-CM | POA: Diagnosis not present

## 2019-09-18 DIAGNOSIS — E271 Primary adrenocortical insufficiency: Secondary | ICD-10-CM | POA: Insufficient documentation

## 2019-09-18 LAB — FERRITIN: Ferritin: 56 ng/mL (ref 11–307)

## 2019-09-18 LAB — CMP (CANCER CENTER ONLY)
ALT: 15 U/L (ref 0–44)
AST: 23 U/L (ref 15–41)
Albumin: 4.2 g/dL (ref 3.5–5.0)
Alkaline Phosphatase: 34 U/L — ABNORMAL LOW (ref 38–126)
Anion gap: 8 (ref 5–15)
BUN: 19 mg/dL (ref 8–23)
CO2: 27 mmol/L (ref 22–32)
Calcium: 10.5 mg/dL — ABNORMAL HIGH (ref 8.9–10.3)
Chloride: 107 mmol/L (ref 98–111)
Creatinine: 1 mg/dL (ref 0.44–1.00)
GFR, Est AFR Am: >60 mL/min (ref >60)
GFR, Estimated: 57 mL/min — ABNORMAL LOW (ref >60)
Glucose, Bld: 81 mg/dL (ref 70–99)
Potassium: 4.2 mmol/L (ref 3.5–5.1)
Sodium: 142 mmol/L (ref 135–145)
Total Bilirubin: 0.6 mg/dL (ref 0.3–1.2)
Total Protein: 7.1 g/dL (ref 6.5–8.1)

## 2019-09-18 LAB — CBC WITH DIFFERENTIAL (CANCER CENTER ONLY)
Abs Immature Granulocytes: 0.02 10*3/uL (ref 0.00–0.07)
Basophils Absolute: 0 10*3/uL (ref 0.0–0.1)
Basophils Relative: 0 %
Eosinophils Absolute: 0.1 10*3/uL (ref 0.0–0.5)
Eosinophils Relative: 1 %
HCT: 41.5 % (ref 36.0–46.0)
Hemoglobin: 13.2 g/dL (ref 12.0–15.0)
Immature Granulocytes: 0 %
Lymphocytes Relative: 33 %
Lymphs Abs: 2.6 10*3/uL (ref 0.7–4.0)
MCH: 30.7 pg (ref 26.0–34.0)
MCHC: 31.8 g/dL (ref 30.0–36.0)
MCV: 96.5 fL (ref 80.0–100.0)
Monocytes Absolute: 0.7 10*3/uL (ref 0.1–1.0)
Monocytes Relative: 9 %
Neutro Abs: 4.4 10*3/uL (ref 1.7–7.7)
Neutrophils Relative %: 57 %
Platelet Count: 218 10*3/uL (ref 150–400)
RBC: 4.3 MIL/uL (ref 3.87–5.11)
RDW: 12.6 % (ref 11.5–15.5)
WBC Count: 7.8 10*3/uL (ref 4.0–10.5)
nRBC: 0 % (ref 0.0–0.2)

## 2019-09-18 LAB — IRON AND TIBC
Iron: 67 ug/dL (ref 41–142)
Saturation Ratios: 17 % — ABNORMAL LOW (ref 21–57)
TIBC: 391 ug/dL (ref 236–444)
UIBC: 324 ug/dL (ref 120–384)

## 2019-09-18 LAB — APTT: aPTT: 25 seconds (ref 24–36)

## 2019-09-18 LAB — RETIC PANEL
Immature Retic Fract: 9.4 % (ref 2.3–15.9)
RBC.: 4.2 MIL/uL (ref 3.87–5.11)
Retic Count, Absolute: 50.8 10*3/uL (ref 19.0–186.0)
Retic Ct Pct: 1.2 % (ref 0.4–3.1)
Reticulocyte Hemoglobin: 36.3 pg (ref >27.9)

## 2019-09-18 LAB — PROTIME-INR
INR: 0.9 (ref 0.8–1.2)
Prothrombin Time: 12.2 seconds (ref 11.4–15.2)

## 2019-09-18 LAB — PLATELET FUNCTION ASSAY: Collagen / Epinephrine: 98 seconds (ref 0–193)

## 2019-09-18 LAB — SEDIMENTATION RATE: Sed Rate: 7 mm/hr (ref 0–22)

## 2019-09-18 LAB — C-REACTIVE PROTEIN: CRP: 0.7 mg/dL (ref <1.0)

## 2019-09-18 LAB — FIBRINOGEN: Fibrinogen: 322 mg/dL (ref 210–475)

## 2019-09-18 LAB — SAVE SMEAR(SSMR), FOR PROVIDER SLIDE REVIEW

## 2019-09-18 NOTE — Progress Notes (Addendum)
[place]  [phone] [phone]     ID: KHRISTY KALAN DOB: December 06, 1945  MR#: [id number]  UEK#:[id number]  Patient Care Team: Shirline Frees, MD as PCP - General (Family Medicine) Eustace Moore, MD as Consulting Physician (Neurosurgery) Elayne Snare, MD as Consulting Physician (Endocrinology) Verner Chol, MD as Consulting Physician (Sports Medicine) Festus Aloe, MD as Consulting Physician (Urology) Clarene Essex, MD as Consulting Physician (Gastroenterology) Marcial Pacas, MD as Consulting Physician (Neurology) Magrinat, Virgie Dad, MD as Consulting Physician (Oncology) Scot Dock, NP OTHER MD:  CHIEF COMPLAINT:  Easy Bruising   HISTORY OF CURRENT ILLNESS: Kerry-Anne notes that her issues began a few weeks ago when she developed a bruise down her entire left lower leg and doesn't remember how.  She notes that over the past two weeks she has had been lightly tapped, or touch, and wil subsequently develop a burning sensation at that location, followed by a bruise.  She has had several surgeries throughout her life and has not had any increased bleeding or complications with those.  She does not have any other easy bruising or bleeding issues.    She was evaluated by her PCP, with Dr. Mannie Stabile, and coagulation studies with PT/INR, PTT, CBC were all normal.  Due to her easy bruising, she was referred to hematology for further evaluation and work up.    Of note, she has a history of iron deficiency and takes an iron supplement daily.    The patient's subsequent history is as detailed below.  INTERVAL HISTORY: Kaleeya notes she is doing quite well today.  She notes the area on her right forearm that is bruised started from her 72 month old grandson who she keeps during the day.  She says he grabbed her arm, and she subsequently had a nickel sized bruise.    REVIEW OF SYSTEMS:  Kharizma denies any easy bleeding, blood in her stool, black tarry  stool, lymphadenopathy, night sweats, unintentional weight loss.  She notes that she is taking steroids with medrol and florinef for her addisons, and tolerates these quite well.  She has f/u with her endocrinologist later this month, Dr. Dwyane Dee.  She has h/o MS, that is stable, and is able to exercise by walking regularly without much difficulty/weakness.  Kiasha has no fever, chills, cough, shortness of breath, chest pain, palpitations, nausea, vomiting, bowel/bladder changes, headaches, vision issues, or any other concerns.  A detailed ROS was otherwise non contributory.    PAST MEDICAL HISTORY: Past Medical History:  Diagnosis Date  . Addison's disease ([place])    takes Solu Cortef daily  . Anemia    takes Ferrous Sulfate daily  . Anxiety    takes Xanax nightly  . Arthritis   . Chronic back pain    stenosis  . Depression    takes Cymbalta daily  . Dizziness    if b/p drops   . Fibromyalgia   . Fibromyalgia   . GERD (gastroesophageal reflux disease)   . Headache(784.0)   . History of blood transfusion    no abnormal reaction noted  . History of bronchitis    many yrs ago   . Hypokalemia    takes Potassium daily  . Hypotension   . Hypotension    takes Florinef daily  . IBS (irritable bowel syndrome)    takes Electronics engineer daily  . Insomnia    takes Trazodone nightly  . Joint pain   . Multiple sclerosis ([place])    doesn't  take any meds  . Multiple sclerosis ([place])   . Nocturia   . Osteoporosis   . Peripheral neuropathy   . Restless leg syndrome   . Seasonal allergies    takes Zyrtec daily;uses Flonase daily as needed  . Syncope   . Urinary frequency    takes Flomax daily  . Weakness    numbness and tingling    PAST SURGICAL HISTORY: Past Surgical History:  Procedure Laterality Date  . ABDOMINAL HYSTERECTOMY  1988  . APPENDECTOMY  1988  . CESAREAN SECTION  1973/1977   x2  . CHOLECYSTECTOMY  1997  . COLECTOMY  1990  . COLONOSCOPY    . ESOPHAGOGASTRODUODENOSCOPY    .  EYE SURGERY     bilateral - /w IOL  . FRACTURE SURGERY Right    rods and screws  . LUMBAR LAMINECTOMY/DECOMPRESSION MICRODISCECTOMY Left 01/24/2013   Procedure: LUMBAR FIVE TO SACRAL ONE LUMBAR LAMINECTOMY/DECOMPRESSION MICRODISCECTOMY 1 LEVEL;  Surgeon: Eustace Moore, MD;  Location: [facility];  Service: Neurosurgery;  Laterality: Left;  Marland Kitchen MAXIMUM ACCESS (MAS)POSTERIOR LUMBAR INTERBODY FUSION (PLIF) 1 LEVEL N/A 06/18/2014   Procedure: MAXIMUM ACCESS SURGERY POSTERIOR LUMBAR INTERBODY FUSION LUMBAR FIVE TO SACRAL ONE ;  Surgeon: Eustace Moore, MD;  Location: [facility];  Service: Neurosurgery;  Laterality: N/A;    FAMILY HISTORY Family History  Problem Relation Age of Onset  . Hypertension Mother   . Stroke Mother   . Heart attack Father   . Tremor Brother     GYNECOLOGIC HISTORY:   Menarche: 74 years old Age at first live birth: 74 years old [place] P 2 LMP 42 Contraceptive s/p TAH HRT no  Hysterectomy? 1988 Salpingo-oophorectomy?no    SOCIAL HISTORY:  Zoee is married and lives with her husband [name] in [place], [place].  She previously worked at [employer] prior to going out on disability secondary to her MS, her husband retired from [employer] (previously [employer]).  She and her husband keep her 72 month old grandson during the day.  She has two sons who both live in [place].  Her oldest son is Gerald Stabs, and she tells me that she cannot recall what he does.  Her youngest son Lennette Bihari is a Freight forwarder at the main office [employer].  She is a never smoker, does not drink ETOH, and does not use illicit drugs.     ADVANCED DIRECTIVES: Not in place  HEALTH MAINTENANCE: Social History   Tobacco Use  . Smoking status: Never Smoker  . Smokeless tobacco: Never Used  Vaping Use  . Vaping Use: Never used  Substance Use Topics  . Alcohol use: No  . Drug use: No     Colonoscopy: not needed secondary to her colectomy  PAP: annually with Dr. Orpah Greek  Bone density: 2020 has  osteoporosis sees Dr. Layne Benton for this  Mammogram: 2020   No Known Allergies  Current Outpatient Medications  Medication Sig Dispense Refill  . Acetaminophen (TYLENOL 8 HOUR ARTHRITIS PAIN PO) Take 1-2 tablets by mouth daily.    Marland Kitchen alfuzosin (UROXATRAL) 10 MG 24 hr tablet Take 10 mg by mouth daily with breakfast.    . B Complex Vitamins (VITAMIN B COMPLEX PO) Take by mouth.    . cephALEXin (KEFLEX) 250 MG capsule 250 mg See admin instructions. Take 1 tablet by mouth every other night at bedtime.    . Cholecalciferol (VITAMIN D3 PO) Take 1 tablet by mouth daily.    Marland Kitchen dicyclomine (BENTYL) 10 MG capsule  Take 10 mg by mouth 4 (four) times daily -  before meals and at bedtime. Take 1 tablet by mouth 30 minutes before meals and at bedtime.    . Ferrous Sulfate Dried 200 (65 FE) MG TABS Take 65 mg by mouth daily.    . fludrocortisone (FLORINEF) 0.1 MG tablet TAKE 2 TABLETS IN THE PM. 180 tablet 0  . Gabapentin Enacarbil (HORIZANT) 600 MG TBCR Take 1 tablet (600 mg total) by mouth at bedtime. 90 tablet 3  . methylPREDNISolone (MEDROL) 4 MG tablet TAKE 1 TABLET (4MG) AT BREAKFAST AND 1/2 TABLET (2MG) AT 4PM. 135 tablet 1  . mirtazapine (REMERON) 15 MG tablet Take 15 mg by mouth at bedtime.    . Multiple Vitamin (MULTIVITAMIN) tablet Take 1 tablet by mouth daily.    Vladimir Faster Glycol-Propyl Glycol (SYSTANE OP) Apply 2 drops to eye daily as needed (for dry eyes). For both eyes     No current facility-administered medications for this visit.    OBJECTIVE:  Vitals:   09/18/19 0834  BP: (!) 173/70  Pulse: 65  Resp: 17  Temp: (!) 96.8 F (36 C)  SpO2: 100%     Body mass index is 21.7 kg/m.   Wt Readings from Last 3 Encounters:  09/18/19 111 lb 1.6 oz (50.4 kg)  05/02/19 116 lb (52.6 kg)  11/01/18 124 lb 8 oz (56.5 kg)  ECOG FS:1 - Symptomatic but completely ambulatory GENERAL: Patient is a well appearing female in no acute distress HEENT:  Sclerae anicteric.  Mask in place. Neck is supple.   NODES:  No cervical, supraclavicular, or axillary lymphadenopathy palpated.  LUNGS:  Clear to auscultation bilaterally.  No wheezes or rhonchi. HEART:  Regular rate and rhythm. No murmur appreciated. ABDOMEN:  Soft, nontender.  Positive, normoactive bowel sounds. No organomegaly palpated. MSK:  No focal spinal tenderness to palpation.  EXTREMITIES:  No peripheral edema.   SKIN:  Clear with no obvious rashes or skin changes. No nail dyscrasia. NEURO:  Nonfocal. Well oriented.  Appropriate affect.     LAB RESULTS:  CMP     Component Value Date/Time   NA 142 09/18/2019 0814   NA 140 02/06/2017 1126   K 4.2 09/18/2019 0814   CL 107 09/18/2019 0814   CO2 27 09/18/2019 0814   GLUCOSE 81 09/18/2019 0814   BUN 19 09/18/2019 0814   BUN 22 02/06/2017 1126   CREATININE 1.00 09/18/2019 0814   CREATININE 0.85 11/06/2013 0840   CALCIUM 10.5 (H) 09/18/2019 0814   PROT 7.1 09/18/2019 0814   ALBUMIN 4.2 09/18/2019 0814   AST 23 09/18/2019 0814   ALT 15 09/18/2019 0814   ALKPHOS 34 (L) 09/18/2019 0814   BILITOT 0.6 09/18/2019 0814   GFRNONAA 57 (L) 09/18/2019 0814   GFRAA >60 09/18/2019 0814    No results found for: TOTALPROTELP, ALBUMINELP, A1GS, A2GS, BETS, BETA2SER, GAMS, MSPIKE, SPEI  No results found for: Nils Pyle, [facility]  Lab Results  Component Value Date   WBC 7.8 09/18/2019   NEUTROABS 4.4 09/18/2019   HGB 13.2 09/18/2019   HCT 41.5 09/18/2019   MCV 96.5 09/18/2019   PLT 218 09/18/2019      Chemistry      Component Value Date/Time   NA 142 09/18/2019 0814   NA 140 02/06/2017 1126   K 4.2 09/18/2019 0814   CL 107 09/18/2019 0814   CO2 27 09/18/2019 0814   BUN 19 09/18/2019 0814   BUN 22 02/06/2017 1126  CREATININE 1.00 09/18/2019 0814   CREATININE 0.85 11/06/2013 0840      Component Value Date/Time   CALCIUM 10.5 (H) 09/18/2019 0814   ALKPHOS 34 (L) 09/18/2019 0814   AST 23 09/18/2019 0814   ALT 15 09/18/2019 0814   BILITOT 0.6  09/18/2019 0814       No results found for: LABCA2  No components found for: FTDDUK025  Recent Labs  Lab 09/18/19 0814  INR 0.9    No results found for: LABCA2  No results found for: KYH062  No results found for: BJS283  No results found for: TDV761  No results found for: CA2729  No components found for: HGQUANT  No results found for: CEA1 / No results found for: CEA1   No results found for: AFPTUMOR  No results found for: [place]  No results found for: PSA1  Appointment on 09/18/2019  Component Date Value Ref Range Status  . PFA Interpretation 09/18/2019          Final   Comment: Platelet function is normal. If patient history/physical examination give strong indication of a bleeding disorder repeat testing for confirmation.        Results of the test should always be interpreted in conjunction with the patient's medical history, clinical presentation and medication history. Patients with Hematocrit values <35.0% or Platelet counts <150,000/uL may result in values above the Laboratory established reference range.   . Collagen / Epinephrine 09/18/2019 98  0 - 193 seconds Final   Performed at [facility], [address], [place], [place] [address]  . aPTT 09/18/2019 25  24 - 36 seconds Final   Performed at [facility], [place] [address] [place], [place] [address]  . Prothrombin Time 09/18/2019 12.2  11.4 - 15.2 seconds Final  . INR 09/18/2019 0.9  0.8 - 1.2 Final   Comment: (NOTE) INR goal varies based on device and disease states. Performed at [facility], [place] [address], [place], [place] [address]   . CRP 09/18/2019 0.7  <1.0 mg/dL Final   Performed at [place] [address], [place], [place] [address]  . Sed Rate 09/18/2019 7  0 - 22 mm/hr Final   Performed at [facility], [place] [address] [place], [place] [address]  . Sodium 09/18/2019 142  135 - 145 mmol/L Final   . Potassium 09/18/2019 4.2  3.5 - 5.1 mmol/L Final  . Chloride 09/18/2019 107  98 - 111 mmol/L Final  . CO2 09/18/2019 27  22 - 32 mmol/L Final  . Glucose, Bld 09/18/2019 81  70 - 99 mg/dL Final   Glucose reference range applies only to samples taken after fasting for at least 8 hours.  . BUN 09/18/2019 19  8 - 23 mg/dL Final  . Creatinine 09/18/2019 1.00  0.44 - 1.00 mg/dL Final  . Calcium 09/18/2019 10.5* 8.9 - 10.3 mg/dL Final  . Total Protein 09/18/2019 7.1  6.5 - 8.1 g/dL Final  . Albumin 09/18/2019 4.2  3.5 - 5.0 g/dL Final  . AST 09/18/2019 23  15 - 41 U/L Final  . ALT 09/18/2019 15  0 - 44 U/L Final  . Alkaline Phosphatase 09/18/2019 34* 38 - 126 U/L Final  . Total Bilirubin 09/18/2019 0.6  0.3 - 1.2 mg/dL Final  . GFR, Est Non Af Am 09/18/2019 57* >60 mL/min Final  . GFR, Est AFR Am 09/18/2019 >60  >60 mL/min Final  . Anion gap 09/18/2019 8  5 -  15 Final   Performed at [facility], [place] [address], [place], [place] [address]  . Ferritin 09/18/2019 56  11 - 307 ng/mL Final   Performed at [facility], [place] [address] [place], [place] [address]  . Iron 09/18/2019 67  41 - 142 ug/dL Final  . TIBC 09/18/2019 391  236 - 444 ug/dL Final  . Saturation Ratios 09/18/2019 17* 21 - 57 % Final  . UIBC 09/18/2019 324  120 - 384 ug/dL Final   Performed at [facility], [place] [address], [place], [place] [address]  . Smear Review 09/18/2019 SMEAR STAINED AND AVAILABLE FOR REVIEW   Final   Performed at [facility], [address], [place], [place] [address]  . Retic Ct Pct 09/18/2019 1.2  0.4 - 3.1 % Final  . RBC. 09/18/2019 4.20  3.87 - 5.11 MIL/uL Final  . Retic Count, Absolute 09/18/2019 50.8  19.0 - 186.0 K/uL Final  . Immature Retic Fract 09/18/2019 9.4  2.3 - 15.9 % Final  . Reticulocyte Hemoglobin 09/18/2019 36.3  >27.9 pg Final   Comment:        Given the high negative  predictive value of a RET-He result > 32 pg iron deficiency is essentially excluded. If this patient is anemic other etiologies should be considered. Performed at [facility], [place] [address] [place], [place] [address]   . WBC Count 09/18/2019 7.8  4.0 - 10.5 K/uL Final  . RBC 09/18/2019 4.30  3.87 - 5.11 MIL/uL Final  . Hemoglobin 09/18/2019 13.2  12.0 - 15.0 g/dL Final  . HCT 09/18/2019 41.5  36 - 46 % Final  . MCV 09/18/2019 96.5  80.0 - 100.0 fL Final  . MCH 09/18/2019 30.7  26.0 - 34.0 pg Final  . MCHC 09/18/2019 31.8  30.0 - 36.0 g/dL Final  . RDW 09/18/2019 12.6  11.5 - 15.5 % Final  . Platelet Count 09/18/2019 218  150 - 400 K/uL Final  . nRBC 09/18/2019 0.0  0.0 - 0.2 % Final  . Neutrophils Relative % 09/18/2019 57  % Final  . Neutro Abs 09/18/2019 4.4  1.7 - 7.7 K/uL Final  . Lymphocytes Relative 09/18/2019 33  % Final  . Lymphs Abs 09/18/2019 2.6  0.7 - 4.0 K/uL Final  . Monocytes Relative 09/18/2019 9  % Final  . Monocytes Absolute 09/18/2019 0.7  0 - 1 K/uL Final  . Eosinophils Relative 09/18/2019 1  % Final  . Eosinophils Absolute 09/18/2019 0.1  0 - 0 K/uL Final  . Basophils Relative 09/18/2019 0  % Final  . Basophils Absolute 09/18/2019 0.0  0 - 0 K/uL Final  . Immature Granulocytes 09/18/2019 0  % Final  . Abs Immature Granulocytes 09/18/2019 0.02  0.00 - 0.07 K/uL Final   Performed at [facility], [place] [address] [place], [place] [address]    (this displays the last labs from the last 3 days)  No results found for: TOTALPROTELP, ALBUMINELP, A1GS, A2GS, BETS, BETA2SER, GAMS, MSPIKE, SPEI (this displays SPEP labs)  No results found for: KPAFRELGTCHN, LAMBDASER, KAPLAMBRATIO (kappa/lambda light chains)  No results found for: HGBA, HGBA2QUANT, HGBFQUANT, HGBSQUAN (Hemoglobinopathy evaluation)   No results found for: LDH  Lab Results  Component Value Date   IRON 67 09/18/2019   TIBC 391 09/18/2019    IRONPCTSAT 17 (L) 09/18/2019   (Iron and TIBC)  Lab Results  Component Value  Date   FERRITIN 56 09/18/2019    Urinalysis    Component Value Date/Time   COLORURINE YELLOW 09/01/2016 0605   APPEARANCEUR CLOUDY (A) 09/01/2016 0605   LABSPEC 1.020 09/01/2016 0605   PHURINE 6.0 09/01/2016 0605   GLUCOSEU NEGATIVE 09/01/2016 0605   GLUCOSEU NEGATIVE 01/21/2016 1628   HGBUR NEGATIVE 09/01/2016 0605   BILIRUBINUR NEGATIVE 09/01/2016 0605   BILIRUBINUR negative 01/21/2016 1212   KETONESUR 5 (A) 09/01/2016 0605   PROTEINUR 30 (A) 09/01/2016 0605   UROBILINOGEN 0.2 01/21/2016 1628   UROBILINOGEN 0.2 01/21/2016 1212   NITRITE NEGATIVE 09/01/2016 0605   LEUKOCYTESUR MODERATE (A) 09/01/2016 0605     STUDIES: DG Abd 2 Views  Result Date: 08/20/2019 CLINICAL DATA:  Abdominal pain.  Constipation. EXAM: ABDOMEN - 2 VIEW COMPARISON:  Two views of the abdomen 04/09/2018. FINDINGS: Suture material in the pelvis compatible with prior subtotal colectomy noted. There is gaseous distension of a loop of colon in the mid abdomen. Prominent stool ball projecting over the rectosigmoid is noted. The bowel gas pattern is nonobstructive. Spinal stimulator and postoperative change of lower lumbar fusion noted. IMPRESSION: Moderate gaseous distention of the transverse colon may reflect ileus. Negative for bowel obstruction. Prominent stool ball in the rectosigmoid colon. Electronically Signed   By: Inge Rise M.D.   On: 08/20/2019 08:23    ASSESSMENT: 74 y.o. [place] woman being evaluated for easy bruising.    1. Easy bruising   (a) coagulation studies and platelets normal  (b) platelet function, fibrinogen, and von willebrand panel pending  (c) likely secondary to older age and Florinef/medrol taken for addisons   PLAN:  Vaughan Basta met with myself and Dr. Jana Hakim today. We reviewed with her the clotting system has two parts.    First, there are platelets which are clotting cells.  Kathrynn's are  normal in number.  There are no medications that she is currently taking on med review that impair this, such as aspirin.  A platelet function assay is pending.   The second part of the clotting system are proteins that fill the clot.  Her PT and PTT will test this and these were normal.  Since she has undergone several surgeries in the past without bleeding, it is unlikely that she has a congenital disorder.  Von Willebrand can occasionally be acquired.  We will test this panel along with a fibrinogen.    It is also quite possible that due to her aging/thin skin and steroid use that she is bruising easy from these things.  We will evaluate the above labs which are pending and will call her with the results.    Marisela will return to see [place] with her next large bruise.  She was given dr. Virgie Dad card and instructed to call when it happens and he will work her in.    Total encounter time: 60 minutes*  Wilber Bihari, NP 09/18/19 11:24 AM Medical Oncology and Hematology [facility] [place], [place] [address] Tel. [phone]    Fax. [phone]   ADDENDUM: This 74 year old [place] woman was referred for easy bruising and specifically for very large ecchymosis involving her left lower extremity, which however has not resolved.  Today on exam she had to small bruises, each of which was provoked by a minor encounter with one of her grandchildren.  Lakya has a long history of multiple surgeries several of which (for example back surgery) are known to be at high risk for bleeding.  She has had absolutely no bleeding complications from any of these procedures or her pregnancies.  Accordingly the chance of her having a congenital coagulopathy are close to 0.  There are several acquired coagulopathies.  Some of them are made very unlikely by her normal prothrombin time and APTT.  It is possible that she has a case of acquired von Willebrand's disease.  Fibrinogen  deficiency for some reason might also be an acquired cause.  The patient understands that though we are checking these possibilities I do not expect them to give [place] a diagnosis.  She certainly does have atrophic skin partly because of age and partly because of steroid use.  I expect she will have continuing mild bruising as she exhibits right now secondary to minimal trauma consisting with "senile purpura".  However if she develops a major ecchymosis as she had previously I have asked her to come in and see [place] again at that point.  We could then consider further evaluation.  Barring a surprise then we will call her with the lab results when they come in but we have not made a routine return appointment for her here  I personally saw this patient and performed a substantive portion of this encounter with the listed APP documented above.   Chauncey Cruel, MD Medical Oncology and Hematology [facility] [address] [place], [place] [address] Tel. [phone]    Fax. [phone]   *Total Encounter Time as defined by the Centers for Medicare and Medicaid Services includes, in addition to the face-to-face time of a patient visit (documented in the note above) non-face-to-face time: obtaining and reviewing outside history, ordering and reviewing medications, tests or procedures, care coordination (communications with other health care professionals or caregivers) and documentation in the medical record.

## 2019-09-19 ENCOUNTER — Telehealth: Payer: Self-pay | Admitting: Adult Health

## 2019-09-19 LAB — VON WILLEBRAND PANEL
Coagulation Factor VIII: 281 % — ABNORMAL HIGH (ref 56–140)
Ristocetin Co-factor, Plasma: 220 % — ABNORMAL HIGH (ref 50–200)
Von Willebrand Antigen, Plasma: 233 % — ABNORMAL HIGH (ref 50–200)

## 2019-09-19 LAB — COAG STUDIES INTERP REPORT

## 2019-09-19 NOTE — Telephone Encounter (Signed)
No 8/11 los. No changes made to pt's schedule.  

## 2019-09-27 DIAGNOSIS — N318 Other neuromuscular dysfunction of bladder: Secondary | ICD-10-CM | POA: Diagnosis not present

## 2019-09-27 DIAGNOSIS — N3000 Acute cystitis without hematuria: Secondary | ICD-10-CM | POA: Diagnosis not present

## 2019-09-27 DIAGNOSIS — N3020 Other chronic cystitis without hematuria: Secondary | ICD-10-CM | POA: Diagnosis not present

## 2019-09-28 ENCOUNTER — Other Ambulatory Visit: Payer: Self-pay | Admitting: Oncology

## 2019-09-28 ENCOUNTER — Encounter: Payer: Self-pay | Admitting: Oncology

## 2019-09-30 DIAGNOSIS — J069 Acute upper respiratory infection, unspecified: Secondary | ICD-10-CM | POA: Diagnosis not present

## 2019-10-01 ENCOUNTER — Other Ambulatory Visit: Payer: PPO

## 2019-10-01 DIAGNOSIS — J069 Acute upper respiratory infection, unspecified: Secondary | ICD-10-CM | POA: Diagnosis not present

## 2019-10-01 DIAGNOSIS — Z03818 Encounter for observation for suspected exposure to other biological agents ruled out: Secondary | ICD-10-CM | POA: Diagnosis not present

## 2019-10-03 ENCOUNTER — Ambulatory Visit: Payer: PPO | Admitting: Endocrinology

## 2019-11-11 ENCOUNTER — Other Ambulatory Visit (INDEPENDENT_AMBULATORY_CARE_PROVIDER_SITE_OTHER): Payer: PPO

## 2019-11-11 ENCOUNTER — Other Ambulatory Visit: Payer: Self-pay

## 2019-11-11 DIAGNOSIS — E2749 Other adrenocortical insufficiency: Secondary | ICD-10-CM | POA: Diagnosis not present

## 2019-11-11 LAB — BASIC METABOLIC PANEL
BUN: 30 mg/dL — ABNORMAL HIGH (ref 6–23)
CO2: 28 mEq/L (ref 19–32)
Calcium: 10.3 mg/dL (ref 8.4–10.5)
Chloride: 103 mEq/L (ref 96–112)
Creatinine, Ser: 0.97 mg/dL (ref 0.40–1.20)
GFR: 56.43 mL/min — ABNORMAL LOW (ref >60.00)
Glucose, Bld: 93 mg/dL (ref 70–99)
Potassium: 4.1 mEq/L (ref 3.5–5.1)
Sodium: 139 mEq/L (ref 135–145)

## 2019-11-12 DIAGNOSIS — E559 Vitamin D deficiency, unspecified: Secondary | ICD-10-CM | POA: Diagnosis not present

## 2019-11-12 DIAGNOSIS — M810 Age-related osteoporosis without current pathological fracture: Secondary | ICD-10-CM | POA: Diagnosis not present

## 2019-11-14 ENCOUNTER — Other Ambulatory Visit: Payer: Self-pay

## 2019-11-14 ENCOUNTER — Ambulatory Visit: Payer: PPO | Admitting: Endocrinology

## 2019-11-14 ENCOUNTER — Encounter: Payer: Self-pay | Admitting: Endocrinology

## 2019-11-14 VITALS — BP 124/68 | HR 87 | Ht 60.0 in | Wt 114.0 lb

## 2019-11-14 DIAGNOSIS — I951 Orthostatic hypotension: Secondary | ICD-10-CM | POA: Diagnosis not present

## 2019-11-14 DIAGNOSIS — E2749 Other adrenocortical insufficiency: Secondary | ICD-10-CM

## 2019-11-14 NOTE — Progress Notes (Signed)
Patient ID: Katrina Rivera, female   DOB: 1945-02-20, 74 y.o.   MRN: [id number]   Subjective:      Chief complaint: Follow-up of various issues   PROBLEM 1: Dysautonomic orthostatic hypotension   PAST history: She has had long-standing problems with orthostatic hypotension and also hyponatremia. Has been diagnosed with dysautonomia and has multiple other problems related to autonomic neuropathy.   She has been on Florinef since 2004 previously taking 3 tablets daily along with 5 tablets of potassium which had previously controlled her symptoms well . Because of persistent orthostatic symptoms she had been tried on midodrine on 05/28/12 but this was later stopped when blood pressure increased.  She  had medication adjustments done frequently over the last year for regulating her blood pressure.  RECENT history:   She has  been on variable doses of Florinef long-term, as much as 3 a day previously,   Now taking only 1 tablet daily since 05/2017 She takes this in the morning daily on a regular basis  As before she has not checked blood pressure readings regularly, none recently Sitting blood pressure may be relatively high at other doctor's offices No recent symptoms of lightheadedness and dizziness when getting up from sitting and trying to walk   PROBLEM 2: Hypokalemia previously from Florinef: With taking lower doses of Florinef she has not required any potassium supplement    Lab Results  Component Value Date   CREATININE 0.97 11/11/2019   BUN 30 (H) 11/11/2019   NA 139 11/11/2019   K 4.1 11/11/2019   CL 103 11/11/2019   CO2 28 11/11/2019     Adrenal Insufficiency:   This is secondary to pituitary dysfunction  Prior testing included Cortrosyn test showing stimulated level of 15.7, baseline 3.9.  Also confirmed by 24 hour urine free cortisol which was only 3.0.  Although previously she had tolerated oral hydrocortisone and small doses with improvement in  her symptoms she did not continue this long-term She was again symptomatic with weight loss, decreased appetite, nausea and also diarrhea Cortrosyn stimulation test on 03/12/14 showed baseline cortisol level of 0.3 and post injection of 1.1 only  She had GI side effects from hydrocortisone and prednisone and was in the past using hydrocortisone injections using insulin syringe   RECENT HISTORY:  She continues to do well with methylprednisolone without any nausea; previously was on hydrocortisone injections because of GI intolerance with oral supplements   She is taking 4 mg in the early morning and half tablet in the late afternoon  Recently she feels fairly good with no unusual fatigue, decreased appetite or nausea Weight is about the same with some fluctuation She is a little more active taking care of grandchildren  Sodium and potassium normal  Wt Readings from Last 3 Encounters:  11/14/19 114 lb (51.7 kg)  09/18/19 111 lb 1.6 oz (50.4 kg)  05/02/19 116 lb (52.6 kg)     General Endocrinology:   HYPERCALCEMIA:  She has had intermittent hypercalcemia since at least 2018, previously only temporarily associated with renal dysfunction Her PTH level was 31 previously and subsequently 23  However her calcium is variable, now consistently upper normal  She is not on calcium supplements  She does have osteoporosis as of 2016, taking Prolia from orthopedic surgeon She was told by orthopedic surgeon to take vitamin D  Lab Results  Component Value Date   PTH 23 02/06/2017   CALCIUM 10.3 11/11/2019   PHOS 1.9 (L) 07/23/2006  Allergies as of 11/14/2019   No Known Allergies     Medication List       Accurate as of November 14, 2019  2:15 PM. If you have any questions, ask your nurse or doctor.        alfuzosin 10 MG 24 hr tablet Commonly known as: UROXATRAL Take 10 mg by mouth daily with breakfast.   Bentyl 10 MG capsule Generic drug: dicyclomine Take 10 mg by mouth 4  (four) times daily -  before meals and at bedtime. Take 1 tablet by mouth 30 minutes before meals and at bedtime.   cephALEXin 250 MG capsule Commonly known as: KEFLEX 250 mg See admin instructions. Take 1 tablet by mouth every other night at bedtime.   Ferrous Sulfate Dried 200 (65 Fe) MG Tabs Take 65 mg by mouth daily.   fludrocortisone 0.1 MG tablet Commonly known as: FLORINEF TAKE 2 TABLETS IN THE PM.   Horizant 600 MG Tbcr Generic drug: Gabapentin Enacarbil Take 1 tablet (600 mg total) by mouth at bedtime.   methylPREDNISolone 4 MG tablet Commonly known as: MEDROL TAKE 1 TABLET (4MG ) AT BREAKFAST AND 1/2 TABLET (2MG ) AT 4PM.   mirtazapine 15 MG tablet Commonly known as: REMERON Take 15 mg by mouth at bedtime.   multivitamin tablet Take 1 tablet by mouth daily.   SYSTANE OP Apply 2 drops to eye daily as needed (for dry eyes). For both eyes   TYLENOL 8 HOUR ARTHRITIS PAIN PO Take 1-2 tablets by mouth daily.   VITAMIN B COMPLEX PO Take by mouth.   VITAMIN D3 PO Take 1 tablet by mouth daily.       Review of Systems    Genitourinary: She has had difficulty emptying her bladder and is being treated by urologist   OSTEOPOROSIS: This is being treated by her orthopedic surgeon with Prolia  Previous bone density in 2016 showed the following: AP LUMBAR SPINE L1 through L4 Young Adult T-Score:  -2.4 LEFT FEMUR NECK: Young Adult T-Score: -3.1  Last bone density in 12/20 showed improvement in her T-scores up to -2.7 at the right femur and 3.6radius Previously in 12/2016 has T score of -3.3 at the dual femur neck right and -2.3 for the L1-L3  Vitamin D deficiency: She is on 1000 units vitamin D3     Objective:   Physical Exam  BP 124/68 (BP Location: Left Arm, Patient Position: Standing, Cuff Size: Normal)    Pulse 87    Ht 5' (1.524 m)    Wt 114 lb (51.7 kg)    SpO2 98%    BMI 22.26 kg/m   She has no centripetal fat around the neck or supraclavicular  area She does have ecchymosis on her right upper arm anteriorly  Assessment:       Adrenal insufficiency:  She has long-standing secondary adrenal insufficiency with fairly typical symptoms at baseline and very low cortisol levels on the stimulation test Symptoms consistently well controlled on methylprednisolone without evidence of any cushingoid features on exam  She is on physiological doses using 4 mg in the morning and 2 mg in the evening Subjectively doing well  Electrolytes normal She will continue the same doses long-term   Orthostatic dysautonomic hypotension syndrome:   Her orthostatic hypotension has been now consistently well controlled with 0.1 mg Florinef Although her systolic pressure does go down 20 mm on standing up she does not have any symptoms  Advised her to check her blood pressure regularly including  standing reading  Potassium normal   Hypercalcemia: Calcium is upper normal and consistent  Likely has mild hyperparathyroidism She is being treated for osteoporosis with Prolia by her orthopedic specialist    Plan:      As above, no change in her medication regimen discussed previously  She will follow-up in 4 months  Dolores Ewing Lucianne Muss  Note: This office note was prepared with Insurance underwriter. Any transcriptional errors that result from this process are unintentional.

## 2019-11-14 NOTE — Patient Instructions (Addendum)
Check BP weekly 

## 2019-11-18 ENCOUNTER — Other Ambulatory Visit: Payer: Self-pay | Admitting: Endocrinology

## 2019-11-26 DIAGNOSIS — D508 Other iron deficiency anemias: Secondary | ICD-10-CM | POA: Diagnosis not present

## 2019-11-26 DIAGNOSIS — F5101 Primary insomnia: Secondary | ICD-10-CM | POA: Diagnosis not present

## 2019-11-26 DIAGNOSIS — I10 Essential (primary) hypertension: Secondary | ICD-10-CM | POA: Diagnosis not present

## 2019-11-26 DIAGNOSIS — N183 Chronic kidney disease, stage 3 unspecified: Secondary | ICD-10-CM | POA: Diagnosis not present

## 2019-11-26 DIAGNOSIS — E2740 Unspecified adrenocortical insufficiency: Secondary | ICD-10-CM | POA: Diagnosis not present

## 2019-11-26 DIAGNOSIS — M797 Fibromyalgia: Secondary | ICD-10-CM | POA: Diagnosis not present

## 2019-11-26 DIAGNOSIS — Z23 Encounter for immunization: Secondary | ICD-10-CM | POA: Diagnosis not present

## 2019-11-26 DIAGNOSIS — G259 Extrapyramidal and movement disorder, unspecified: Secondary | ICD-10-CM | POA: Diagnosis not present

## 2019-12-05 DIAGNOSIS — H40013 Open angle with borderline findings, low risk, bilateral: Secondary | ICD-10-CM | POA: Diagnosis not present

## 2019-12-05 DIAGNOSIS — N318 Other neuromuscular dysfunction of bladder: Secondary | ICD-10-CM | POA: Diagnosis not present

## 2019-12-05 DIAGNOSIS — H04123 Dry eye syndrome of bilateral lacrimal glands: Secondary | ICD-10-CM | POA: Diagnosis not present

## 2019-12-05 DIAGNOSIS — Z961 Presence of intraocular lens: Secondary | ICD-10-CM | POA: Diagnosis not present

## 2019-12-05 DIAGNOSIS — N139 Obstructive and reflux uropathy, unspecified: Secondary | ICD-10-CM | POA: Diagnosis not present

## 2019-12-05 DIAGNOSIS — H35371 Puckering of macula, right eye: Secondary | ICD-10-CM | POA: Diagnosis not present

## 2020-01-15 ENCOUNTER — Telehealth: Payer: Self-pay | Admitting: Neurology

## 2020-01-15 NOTE — Telephone Encounter (Signed)
Pt has called to report that the Neuropathy in feet is worsening, pt has accepted an appointment with Sarah,NP on 12-13 with an arrival of 9:15 for a 9:45 appointment, this is a Financial planner .

## 2020-01-20 ENCOUNTER — Other Ambulatory Visit: Payer: Self-pay

## 2020-01-20 ENCOUNTER — Encounter: Payer: Self-pay | Admitting: Neurology

## 2020-01-20 ENCOUNTER — Ambulatory Visit: Payer: PPO | Admitting: Neurology

## 2020-01-20 VITALS — BP 118/82 | HR 78 | Ht 60.0 in | Wt 114.0 lb

## 2020-01-20 DIAGNOSIS — G2581 Restless legs syndrome: Secondary | ICD-10-CM | POA: Diagnosis not present

## 2020-01-20 DIAGNOSIS — G35 Multiple sclerosis: Secondary | ICD-10-CM

## 2020-01-20 DIAGNOSIS — R269 Unspecified abnormalities of gait and mobility: Secondary | ICD-10-CM

## 2020-01-20 MED ORDER — HORIZANT 600 MG PO TBCR: 1.0000 | EXTENDED_RELEASE_TABLET | Freq: Every day | ORAL | 3 refills | Status: DC

## 2020-01-20 NOTE — Patient Instructions (Addendum)
I will send you for physical therapy  Please call Dr. Yetta Barre for an appointment  Keep appointment in March  Call if symptoms worsen

## 2020-01-20 NOTE — Progress Notes (Signed)
PATIENT: Katrina Rivera DOB: Jul 11, 1945  REASON FOR VISIT: follow up HISTORY FROM: patient  HISTORY OF PRESENT ILLNESS: Today 01/20/20  HISTORY Katrina Rivera is a very pleasant 74 year-old right-handed woman, with relapsing remitting multiple sclerosis, was treated with Betaseron 4 -5 years, but could not tolerate the side effect, now is not on any immunomodulation therapy. neuropathic pain of bilateral lower extremities.  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   She has baseline gait difficulty, she fell in May 2013, fractured both elbows and her left knee cap. She intermittently has been using a cane or walker.  She could not tolerate Requip because of nausea. She has been on gabapentin 300 mg 3 bid. She gained significant weightgainwhen on Lyrica, and therefore was switched back to gabapentin. She has orthostatic hypotension, and is on midodrine 2.5 mg and fludrocortisone 0.1 mg 1 in AM and 2 at night, and she is also on 20 mg of hydrocortisone.  She complains of bilateral lower extremity burning stinging sensation, getting worse when she sits still, difficulty falling into sleep, she has the urge to move because of bilateral extremity discomfort, has tried Requip, could not tolerate it because of GI side effects, Neuprol patch does not work either, she also tried Elavil, Cymbalta in the past, could not tolerate it due to side effect.   Jan 01/2014: She came in urgently for acute worsening of her  generalized condition since her lumbar decompression surgery in January 24 2013 by Dr. Marikay Alar, prior to surgery, she suffered left-sided low back pain, radiating pain to bilateral lower extremity, left is much worse than her right side, there was left L5-S1 extraforaminal herniated nucleus pulposus with left L5 radiculopathy she had left L5-S1 extraforaminal microdiscectomy utilizing microscopic dissection.  She had overnight stay at the hospital the day after surgery, she fell when trying to get up using bathroom, landed on her left side, surgery did help her lower back pain, radiating pain to her left leg, but she complained of worsening bilateral lower extremity deep achy pain, worsening gait difficulty, she has not used walker for 3 years, now she began to use her walker, she felt her skin is so tight at bilateral lower extremity, left leg swelling, also has difficulty swallowing, blurred vision, worsening fatigue, difficulty concentration, she has not had MRI evaluation for her multiple sclerosis for many years, is not on any immunomodulation therapy   UPDATE Mar 01, 2014:She came in with a list of complains, much worse compared to presurgical level. She complains of blurred vision, difficulty sleeping, difficulty concentrating, dizziness, worsening gait difficulty, The most bothersome symptoms are bilateral lower extremity pain, constant, knife cutting pain, left worse than right Left leg showed no DVT on doppler study. She had 15 LB weight gain over one month, more difficulty sleepy, body shaking, more difficulty sleepy.  She has tried fentanyl patch 50 mcg, Trileptal, Neurontin without helping her symptoms .She is going to be seen by her surgeon Dr. Yetta Barre in 3 days  We have reviewed MRI of the brain cervical lumbar spine together, these were  done at [facility] imaging in January 2015 MRI lumbar: L5-S1: disc bulging and facet hypertrophy, status post microdiscectomy on the left, with expected  post-surgical changes. Multi-level facet hypertrophy, with no spinal stenosis or foraminal narrowing.  MRI cervical: Possible small chronic demyelinating plaque at C6 on the right side. No abnormal enhancing lesions  MRI brain: Multiple supratentorial and 1 infratentorial chronic demyelinating plaques. No acute plaques. Mild cerebellar tonsillar ectopia there was no significant change in the brain lesions,   UPDATE 08/22/13 Patient returns for follow up.Her biggest complaint today is restless legs. She has never tried Neupro. She has had multiple trials of medications for her chronic pain. She is currently taking fentanyl, Trileptal, Cymbalta, lidocaine gel  UPDATE Feb 27 2014:She is not on any long-term immunomodulation therapy for her relapsing remitting multiple sclerosis, neurological deficit has been fairly stable, she has baseline gait difficulty, the most bothersome symptoms for her is low back pain, bilateral lower extremity deep achy pain, radiating pain from left lower back to her left leg,  She was recently found to have very low cortisol level, under endocrinologist Dr. Remus Blake care, UPDATE July 23 2014: She had lumbar decompression by Dr. Marikay Alar in Jun 18 2014, which did help her low back pain, but now she experienced worsening left lower extremity spasm, left calf spasm so hard, as if a knot was tied, difficulty walking, she was giving a steroid package, no help, She complained excessive weight gain with Lyrica, Neurontin did not help,  UPDATE July 27th 2016: She came in with a list of complaints, continue complains of unbalanced, staggering, generalized weakness, numbness tingling at bilateral lower extremity, worsening at her left leg, frequent urination, difficulty concentrating, difficulty with multitasking  UPDATE Oct 22 2014: She had a syncope episodeon October 08 2014, was taken to the emergency room, have reviewed ED record, blood pressure was 200/60s laboratory  showed normal CBC, CMP, this happened in the setting of missing her hydrocortisone dose, because of nausea, lack of appetite, dehydration, she is taking hydrocortisone because of Addison's disease, now she is on hydrocortisone shots,She is back to her baseline now, mild gait difficulty, also complains of anxiety, constant bilateral lower chamber paresthesia, she wants to get off Cymbalta 30 mg daily, worry about the long-term side effect. She also complains of bilateral neck, shoulder or upper extremity itching,  UPDATE Apr 05 2016: She had a spinal stimulator placement by neurosurgeon Dr. Buck Mam in January third 2018, which has helped her low back pain,and leg pain.  She had sinus infection, was treated with Zpack and nausea, She has iron infusion in Feb 2018, She noticed that she has worsening gait abnormality, She also has diffuse body achy pain.She has tried Flexeril without benefit, previously tried and failed gabapentin, Cymbalta, fentanyl patch,  CPK was normal 39 WBC showed hemoglobin of 10 point 8, which was mildly decreased normal free T4, TSH, CMP, creat 1.1, normal B12,  UPDATESept 24th 2020: She is overall stable, ambulate without assistant, but with mild unsteady gait, she had a spinal stimulator placement in 2018, is no longer a candidate for MRI, I personally reviewed previous MRIs in 2015, MRI of the brain, multiple supratentorium, and one infratentorial chronic demyelinating plaque, no acute abnormality. MRI of cervical spine, possible small chronic demyelinating plaque at right C6. MRI of lumbar spine, multilevel degenerative changes, postsurgical changes L5-S1 with evidence of left microdiscectomy.  She is now taking Horizant 300 mg every night, which does help her restless leg symptoms some,  but at the end of the day, she complains of whole body tightness,  Laboratory evaluation July 2020, normal CMP, TSH, free T4, BMP with GFR of 56, vitamin D of 87,  Update  May 02, 2019 SS: She is here with her husband. She has spinal stimulator in place since 2018, no longer candidate for MRI. In the afternoon, she may feel tightness in her trunk, feels like her shirt is too tight. Takes Horizant  At 5 pm, takes OTC sleep aid, along with remeron. She is now sleeping through the night, pain isn't waking her. Walking is doing well. She feels she is stable overall. No assistive device. No falls. Keeps grandkids a few days a week. Neuropathy pain is mostly at night, not so much during the day. Feels overall stable.   Update January 20, 2020 SS:  Here with husband, notices in feet when walking on different surfaces, feels off balance, in store use a car, feels like on ice. Burning in feet, more in the day now, is bothering her more. Is sleeping well at night, taking Horizant 600 mg 5 pm. Does fine at the house, keeps grandson and is fine, but on pavement, feels like walking ice. Some near falls. Has spinal stimulator, sometimes makes legs hurt, may turn it off, sees Dr. Yetta Barre, past due to adjustment. Husband doesn't notice any difference, but she hasn't exercised in 6 months. Feels cold at times, uses heated throw. More anxiety lately. Symptoms started last 1-2 months.   REVIEW OF SYSTEMS: Out of a complete 14 system review of symptoms, the patient complains only of the following symptoms, and all other reviewed systems are negative.  Walking difficulty  ALLERGIES: No Known Allergies  HOME MEDICATIONS: Outpatient Medications Prior to Visit  Medication Sig Dispense Refill  . Acetaminophen (TYLENOL 8 HOUR ARTHRITIS PAIN PO) Take 1-2 tablets by mouth daily.    Marland Kitchen alfuzosin (UROXATRAL) 10 MG 24 hr tablet Take 10 mg by mouth daily with breakfast.    . B Complex Vitamins (VITAMIN B COMPLEX PO) Take by mouth.    . cephALEXin (KEFLEX) 250 MG capsule 250 mg See admin instructions. Take 1 tablet by mouth every other night at bedtime.    . Cholecalciferol (VITAMIN D3 PO) Take 1  tablet by mouth daily.    Marland Kitchen dicyclomine (BENTYL) 10 MG capsule Take 10 mg by mouth 4 (four) times daily -  before meals and at bedtime. Take 1 tablet by mouth 30 minutes before meals and at bedtime.    . Ferrous Sulfate Dried 200 (65 FE) MG TABS Take 65 mg by mouth daily.    . fludrocortisone (FLORINEF) 0.1 MG tablet TAKE 2 TABLETS IN THE PM. 180 tablet 0  . Gabapentin Enacarbil (HORIZANT) 600 MG TBCR Take 1 tablet (600 mg total) by mouth at bedtime. 90 tablet 3  . methylPREDNISolone (MEDROL) 4 MG tablet TAKE 1 TABLET (4MG ) AT BREAKFAST AND 1/2 TABLET (2MG ) AT 4PM. 135 tablet 1  . mirtazapine (REMERON) 15 MG tablet Take 15 mg by mouth at bedtime.    . Multiple Vitamin (MULTIVITAMIN) tablet Take 1 tablet by mouth daily.    Glycol-Propyl Glycol (SYSTANE OP) Apply 2 drops to eye daily as needed (for dry eyes). For both eyes     No facility-administered medications prior to visit.    PAST MEDICAL HISTORY: Past Medical History:  Diagnosis Date  . Addison's disease (HCC)    takes Solu Cortef daily  . Anemia    takes  Ferrous Sulfate daily  . Anxiety    takes Xanax nightly  . Arthritis   . Chronic back pain    stenosis  . Depression    takes Cymbalta daily  . Dizziness    if b/p drops   . Fibromyalgia   . Fibromyalgia   . GERD (gastroesophageal reflux disease)   . Headache(784.0)   . History of blood transfusion    no abnormal reaction noted  . History of bronchitis    many yrs ago   . Hypokalemia    takes Potassium daily  . Hypotension   . Hypotension    takes Florinef daily  . IBS (irritable bowel syndrome)    takes Librarian, academic daily  . Insomnia    takes Trazodone nightly  . Joint pain   . Multiple sclerosis (HCC)    doesn't take any meds  . Multiple sclerosis (HCC)   . Nocturia   . Osteoporosis   . Peripheral neuropathy   . Restless leg syndrome   . Seasonal allergies    takes Zyrtec daily;uses Flonase daily as needed  . Syncope   . Urinary frequency     takes Flomax daily  . Weakness    numbness and tingling    PAST SURGICAL HISTORY: Past Surgical History:  Procedure Laterality Date  . ABDOMINAL HYSTERECTOMY  1988  . APPENDECTOMY  1988  . CESAREAN SECTION  1973/1977   x2  . CHOLECYSTECTOMY  1997  . COLECTOMY  1990  . COLONOSCOPY    . ESOPHAGOGASTRODUODENOSCOPY    . EYE SURGERY     bilateral - /w IOL  . FRACTURE SURGERY Right    rods and screws  . LUMBAR LAMINECTOMY/DECOMPRESSION MICRODISCECTOMY Left 01/24/2013   Procedure: LUMBAR FIVE TO SACRAL ONE LUMBAR LAMINECTOMY/DECOMPRESSION MICRODISCECTOMY 1 LEVEL;  Surgeon: Tia Alert, MD;  Location: [facility];  Service: Neurosurgery;  Laterality: Left;  Marland Kitchen MAXIMUM ACCESS (MAS)POSTERIOR LUMBAR INTERBODY FUSION (PLIF) 1 LEVEL N/A 06/18/2014   Procedure: MAXIMUM ACCESS SURGERY POSTERIOR LUMBAR INTERBODY FUSION LUMBAR FIVE TO SACRAL ONE ;  Surgeon: Tia Alert, MD;  Location: [facility];  Service: Neurosurgery;  Laterality: N/A;    FAMILY HISTORY: Family History  Problem Relation Age of Onset  . Hypertension Mother   . Stroke Mother   . Heart attack Father   . Tremor Brother     SOCIAL HISTORY: Social History   Socioeconomic History  . Marital status: Married    Spouse name: [name]  . Number of children: 2  . Years of education: 45  . Highest education level: Not on file  Occupational History    Employer: DISABLED    Comment: Disabled  Tobacco Use  . Smoking status: Never Smoker  . Smokeless tobacco: Never Used  Vaping Use  . Vaping Use: Never used  Substance and Sexual Activity  . Alcohol use: No  . Drug use: No  . Sexual activity: Not on file  Other Topics Concern  . Not on file  Social History Narrative   Pt lives at home with spouse. ([name])   Caffeine Use: 1 cups daily.   Right handed.   Disabled.   Education - high school   Patient has two adult children.   Social Determinants of Health   Financial Resource Strain: Not on file  Food Insecurity: Not  on file  Transportation Needs: Not on file  Physical Activity: Not on file  Stress: Not on file  Social Connections: Not on file  Intimate  Partner Violence: Not on file   PHYSICAL EXAM  Vitals:   01/20/20 0941  BP: 118/82  Pulse: 78  Weight: 114 lb (51.7 kg)  Height: 5' (1.524 m)   Body mass index is 22.26 kg/m.  Generalized: Well developed, in no acute distress   Neurological examination  Mentation: Alert oriented to time, place, history taking. Follows all commands speech and language fluent Cranial nerve II-XII: Pupils were equal round reactive to light. Extraocular movements were full, visual field were full on confrontational test. Facial sensation and strength were normal. Head turning and shoulder shrug  were normal and symmetric. Motor: The motor testing reveals 5 over 5 strength of all 4 extremities. Good symmetric motor tone is noted throughout.  Sensory: Sensory testing is intact to soft touch on all 4 extremities. Pinprick, vibration sensation to bilateral lower extremities is normal. Coordination: Cerebellar testing reveals good finger-nose-finger and heel-to-shin bilaterally.  Gait and station: Gait is cautious, slightly wide-based, but steady Reflexes: Deep tendon reflexes are symmetric and normal bilaterally.   DIAGNOSTIC DATA (LABS, IMAGING, TESTING) - I reviewed patient records, labs, notes, testing and imaging myself where available.  Lab Results  Component Value Date   WBC 7.8 09/18/2019   HGB 13.2 09/18/2019   HCT 41.5 09/18/2019   MCV 96.5 09/18/2019   PLT 218 09/18/2019      Component Value Date/Time   NA 139 11/11/2019 1007   NA 140 02/06/2017 1126   K 4.1 11/11/2019 1007   CL 103 11/11/2019 1007   CO2 28 11/11/2019 1007   GLUCOSE 93 11/11/2019 1007   BUN 30 (H) 11/11/2019 1007   BUN 22 02/06/2017 1126   CREATININE 0.97 11/11/2019 1007   CREATININE 1.00 09/18/2019 0814   CREATININE 0.85 11/06/2013 0840   CALCIUM 10.3 11/11/2019 1007    PROT 7.1 09/18/2019 0814   ALBUMIN 4.2 09/18/2019 0814   AST 23 09/18/2019 0814   ALT 15 09/18/2019 0814   ALKPHOS 34 (L) 09/18/2019 0814   BILITOT 0.6 09/18/2019 0814   GFRNONAA 57 (L) 09/18/2019 0814   GFRAA >60 09/18/2019 0814   No results found for: CHOL, HDL, LDLCALC, LDLDIRECT, TRIG, CHOLHDL No results found for: [id number] Lab Results  Component Value Date   VITAMINB12 1,190 (H) 03/10/2016   Lab Results  Component Value Date   TSH 2.21 08/21/2018   ASSESSMENT AND PLAN 74 y.o. year old female  has a past medical history of Addison's disease (HCC), Anemia, Anxiety, Arthritis, Chronic back pain, Depression, Dizziness, Fibromyalgia, Fibromyalgia, GERD (gastroesophageal reflux disease), Headache(784.0), History of blood transfusion, History of bronchitis, Hypokalemia, Hypotension, Hypotension, IBS (irritable bowel syndrome), Insomnia, Joint pain, Multiple sclerosis (HCC), Multiple sclerosis (HCC), Nocturia, Osteoporosis, Peripheral neuropathy, Restless leg syndrome, Seasonal allergies, Syncope, Urinary frequency, and Weakness. here wit:  1.  Relapsing remitting multiple sclerosis -Last MRIs in 2015, evidence of supratentorium, infratentorium, spinal cord involvement -Is no longer on immune modulating therapy -Not MRI candidate due to spinal stimulator  2.  Chronic low back pain, bilateral lower extremity pain, status post spinal stimulator in January 2018 -Needs to schedule follow-up with Dr. Yetta Barre, having to turn stimulator off lately due to it causing pain in her legs  3.  Restless leg syndrome 4.  Gait disorder, paresthesia lower extremities -Continue Horizant 600 mg at bedtime -Refer to PT for gait and balance training, stopped exercising 6 months ago -Having more anxiety, feeling anxious often, needs to work with PCP -Keep follow-up appointment with me in March   I  spent 30 minutes of face-to-face and non-face-to-face time with patient.  This included previsit chart review,  lab review, study review, order entry, electronic health record documentation, patient education.  Krimson Massmann Margie EgeGNP-C, DNP 01/20/2020, 10:15 AM [facility] [address] [place], [place] [address] [phone]

## 2020-01-22 ENCOUNTER — Encounter: Payer: Self-pay | Admitting: Physical Therapy

## 2020-01-22 ENCOUNTER — Ambulatory Visit: Payer: PPO | Attending: Neurology | Admitting: Physical Therapy

## 2020-01-22 ENCOUNTER — Other Ambulatory Visit: Payer: Self-pay

## 2020-01-22 DIAGNOSIS — M6281 Muscle weakness (generalized): Secondary | ICD-10-CM

## 2020-01-22 DIAGNOSIS — R262 Difficulty in walking, not elsewhere classified: Secondary | ICD-10-CM

## 2020-01-22 DIAGNOSIS — R2689 Other abnormalities of gait and mobility: Secondary | ICD-10-CM

## 2020-01-22 NOTE — Therapy (Signed)
[facility] [address] [place], [place], [address] Phone: [phone]   Fax:  [phone]  Physical Therapy Evaluation  Patient Details  Name: Katrina Rivera MRN: [id number] Date of Birth: [birth year] Referring Provider (PT): Glean Salvo, NP   Encounter Date: 01/22/2020   PT End of Session - 01/22/20 1021    Visit Number 1    Number of Visits 7    Date for PT Re-Evaluation 03/04/20    Authorization Type [employer]    PT Start Time 1015    PT Stop Time 1100    PT Time Calculation (min) 45 min    Equipment Utilized During Treatment Gait belt    Activity Tolerance Patient tolerated treatment well    Behavior During Therapy [facility] for tasks assessed/performed           Past Medical History:  Diagnosis Date  . Addison's disease (HCC)    takes Solu Cortef daily  . Anemia    takes Ferrous Sulfate daily  . Anxiety    takes Xanax nightly  . Arthritis   . Chronic back pain    stenosis  . Depression    takes Cymbalta daily  . Dizziness    if b/p drops   . Fibromyalgia   . Fibromyalgia   . GERD (gastroesophageal reflux disease)   . Headache(784.0)   . History of blood transfusion    no abnormal reaction noted  . History of bronchitis    many yrs ago   . Hypokalemia    takes Potassium daily  . Hypotension   . Hypotension    takes Florinef daily  . IBS (irritable bowel syndrome)    takes Librarian, academic daily  . Insomnia    takes Trazodone nightly  . Joint pain   . Multiple sclerosis (HCC)    doesn't take any meds  . Multiple sclerosis (HCC)   . Nocturia   . Osteoporosis   . Peripheral neuropathy   . Restless leg syndrome   . Seasonal allergies    takes Zyrtec daily;uses Flonase daily as needed  . Syncope   . Urinary frequency    takes Flomax daily  . Weakness    numbness and tingling    Past Surgical History:  Procedure Laterality Date  . ABDOMINAL HYSTERECTOMY  1988  . APPENDECTOMY   1988  . CESAREAN SECTION  1973/1977   x2  . CHOLECYSTECTOMY  1997  . COLECTOMY  1990  . COLONOSCOPY    . ESOPHAGOGASTRODUODENOSCOPY    . EYE SURGERY     bilateral - /w IOL  . FRACTURE SURGERY Right    rods and screws  . LUMBAR LAMINECTOMY/DECOMPRESSION MICRODISCECTOMY Left 01/24/2013   Procedure: LUMBAR FIVE TO SACRAL ONE LUMBAR LAMINECTOMY/DECOMPRESSION MICRODISCECTOMY 1 LEVEL;  Surgeon: Tia Alert, MD;  Location: [facility];  Service: Neurosurgery;  Laterality: Left;  Marland Kitchen MAXIMUM ACCESS (MAS)POSTERIOR LUMBAR INTERBODY FUSION (PLIF) 1 LEVEL N/A 06/18/2014   Procedure: MAXIMUM ACCESS SURGERY POSTERIOR LUMBAR INTERBODY FUSION LUMBAR FIVE TO SACRAL ONE ;  Surgeon: Tia Alert, MD;  Location: [facility];  Service: Neurosurgery;  Laterality: N/A;    There were no vitals filed for this visit.    Subjective Assessment - 01/22/20 1021    Subjective Pt reports MS since '84 with myalgias. Pt states she has neuropathy in her legs but lately when she's walking on different surfaces she can't get her feet to move. Pt reports she feels that she  gets stuck at times. Pt feels that this has worsened within the last month. This has caused more LOBs while out in the community (i.e. [employer]). Pt states she has been in MS remission for >10 years.    Pertinent History MS, prior L knee fracture, R toe fracture, R hip fracture    Limitations Standing;Walking    How long can you sit comfortably? n/a    How long can you stand comfortably? n/a    How long can you walk comfortably? n/a    Patient Stated Goals Improve stability going between different surfaces    Currently in Pain? No/denies              [facility] PT Assessment - 01/22/20 0001      Assessment   Medical Diagnosis G35 (ICD-10-CM) - MS (multiple sclerosis) (HCC)  R26.9 (ICD-10-CM) - Abnormality of gait    Referring Provider (PT) Glean Salvo, NP    Hand Dominance Right    Prior Therapy Years ago s/p fracture      Precautions    Precautions Fall      Restrictions   Weight Bearing Restrictions No      Balance Screen   Has the patient fallen in the past 6 months No      Home Environment   Living Environment Private residence    Living Arrangements Spouse/significant other    Available Help at Discharge Family    Type of Home House    Home Access Stairs to enter    Entrance Stairs-Number of Steps 1   step up   Home Layout One level    Home Equipment Walker - 2 wheels;Cane - single point;Walker - 4 wheels      Prior Function   Level of Independence Independent      Sensation   Light Touch Appears Intact    Proprioception Appears Intact      ROM / Strength   AROM / PROM / Strength Strength      Strength   Overall Strength Comments R hip grossly 4/5; L hip 5/5; bilat knees grossly 5/5    Strength Assessment Site Ankle    Right/Left Ankle Right;Left    Right Ankle Dorsiflexion 4/5    Right Ankle Plantar Flexion 4+/5    Right Ankle Inversion 3+/5    Right Ankle Eversion 3+/5    Left Ankle Dorsiflexion 4/5    Left Ankle Plantar Flexion 4+/5    Left Ankle Inversion 3+/5    Left Ankle Eversion 3+/5      Transfers   Five time sit to stand comments  14.22 sec      Ambulation/Gait   Ambulation Distance (Feet) 330 Feet    Assistive device None    Gait Pattern Step-through pattern;Decreased stance time - left;Decreased step length - left;Decreased dorsiflexion - right;Decreased dorsiflexion - left;Left flexed knee in stance;Shuffle;Left foot flat;Right foot flat;Trunk flexed    Ambulation Surface Level;Indoor    Gait velocity 16.78 sec = 1.95 ft/sec    Stairs Yes    Stairs Assistance 4: Min guard    Stair Management Technique Two rails;Alternating pattern   slow   Number of Stairs 4    Height of Stairs 6      Standardized Balance Assessment   Standardized Balance Assessment Dynamic Gait Index      Dynamic Gait Index   Level Surface Mild Impairment    Change in Gait Speed Severe Impairment     Gait with Horizontal Head  Turns Normal    Gait with Vertical Head Turns Normal    Gait and Pivot Turn Normal    Step Over Obstacle Mild Impairment    Step Around Obstacles Mild Impairment    Steps Mild Impairment    Total Score 17    DGI comment: 17/24      High Level Balance   High Level Balance Comments Feet apart EO on firm surface: 30 sec; EC feet apart on solid surface 30 sec; Feet together solid surface EO 30 sec; feet together solid surface EC: 30 sec; tandem stance 5 sec bilat; SLS on L: 2 sec, SLS on R: 0 sec;                      Objective measurements completed on examination: See above findings.               PT Education - 01/22/20 1421    Education Details Discussed exam findings and POC    Person(s) Educated Patient    Methods Explanation;Verbal cues    Comprehension Verbalized understanding            PT Short Term Goals - 01/22/20 1432      PT SHORT TERM GOAL #1   Title Pt will be independent with initial HEP    Time 3    Period Weeks    Status New    Target Date 02/12/20      PT SHORT TERM GOAL #2   Title Pt will improve her 5x STS to </=12 sec    Baseline 14.22 sec on eval    Time 3    Period Weeks    Status New    Target Date 02/12/20      PT SHORT TERM GOAL #3   Title Pt will be able to demo heel strike during gait without cueing    Time 3    Period Weeks    Status New    Target Date 02/12/20             PT Long Term Goals - 01/22/20 1433      PT LONG TERM GOAL #1   Title Pt will be independent with advanced HEP    Time 6    Period Weeks    Status New    Target Date 03/04/20      PT LONG TERM GOAL #2   Title Pt will improve her DGI score to at least 20/24 to demo decreased fall risk    Baseline 17/24 on 01/22/20    Time 6    Period Weeks    Status New    Target Date 03/04/20      PT LONG TERM GOAL #3   Title Pt will improve gait speed by at least .4 ft/sec to demo minimal clinically important  difference for community mobility    Baseline 1.95 ft/sec    Time 6    Period Weeks    Status New    Target Date 02/12/20                  Plan - 01/22/20 1422    Clinical Impression Statement Katrina Rivera is a 74 y/o F presenting to OPPT due to abnormal gait and greater dificulty walking within the past few months. Pt with history of MS but has been in remission for >10 years. Pt has history of multiple fractures including R foot & hip, and L knee. On assessment,  pt found to have general weakness -- particularly in her ankles -- with decreased stability leading to decreased balance and overall mobility. Pt's DGI score of 17/24, slow gait speed, and 5x STS of >12 sec places her at increased risk of falls. Pt would benefit from PT to address these issues to optimize her level of function and safety in the community.    Personal Factors and Comorbidities Age;Comorbidity 1;Comorbidity 2    Comorbidities MS, history of multiple fractures (R hip, R toes, L knee)    Examination-Activity Limitations Locomotion Level;Squat;Stairs    Examination-Participation Restrictions Community Activity;Yard Work    Conservation officer, historic buildings Evolving/Moderate complexity    Clinical Decision Making Moderate    Rehab Potential Good    PT Frequency 1x / week    PT Duration 6 weeks    PT Treatment/Interventions Aquatic Therapy;Cryotherapy;Electrical Stimulation;Moist Heat;DME Instruction;Ultrasound;Gait training;Stair training;Functional mobility training;Therapeutic activities;Therapeutic exercise;Balance training;Neuromuscular re-education;Patient/family education;Manual techniques;Orthotic Fit/Training;Dry needling;Taping;Vestibular;Passive range of motion    PT Next Visit Plan Work on ankle strengthening. Work on balance with narrower BOS/ankle stability. Establish an HEP    Consulted and Agree with Plan of Care Patient           Patient will benefit from skilled therapeutic  intervention in order to improve the following deficits and impairments:  Abnormal gait,Difficulty walking,Decreased coordination,Decreased balance,Decreased mobility,Decreased strength,Postural dysfunction  Visit Diagnosis: Muscle weakness (generalized)  Difficulty in walking, not elsewhere classified  Other abnormalities of gait and mobility     Problem List Patient Active Problem List   Diagnosis Date Noted  . Restless leg syndrome   . Chronic back pain   . Low back pain without sciatica 11/01/2018  . Essential hypertension   . SBO (small bowel obstruction) (HCC) 09/01/2016  . Fibromyalgia   . Abdominal pain, vomiting, and diarrhea   . Dehydration   . Somnolence, daytime 10/22/2015  . Fatigue 10/22/2015  . Chronic leg pain 06/29/2015  . S/P lumbar spinal fusion 06/18/2014  . Abnormality of gait 02/18/2013  . S/P lumbar microdiscectomy 01/24/2013  . Hypokalemia 12/24/2012  . Sweating abnormality 10/19/2012  . Dysautonomia orthostatic hypotension syndrome (HCC) 08/16/2012  . Hereditary and idiopathic peripheral neuropathy 08/16/2012  . Chronic adrenal insufficiency (HCC) 08/16/2012  . MS (multiple sclerosis) (HCC) 08/15/2012    Tajai Ihde April Ma L [place] PT, DPT 01/22/2020, 2:38 PM  [facility] [address] [place], [place], [address] Phone: [phone]   Fax:  [phone]  Name: Katrina Rivera MRN: [id number] Date of Birth: 05-Mar-1945

## 2020-01-28 ENCOUNTER — Ambulatory Visit: Payer: PPO

## 2020-01-28 ENCOUNTER — Other Ambulatory Visit: Payer: Self-pay

## 2020-01-28 DIAGNOSIS — R2689 Other abnormalities of gait and mobility: Secondary | ICD-10-CM

## 2020-01-28 DIAGNOSIS — R262 Difficulty in walking, not elsewhere classified: Secondary | ICD-10-CM

## 2020-01-28 DIAGNOSIS — M6281 Muscle weakness (generalized): Secondary | ICD-10-CM | POA: Diagnosis not present

## 2020-01-28 NOTE — Patient Instructions (Signed)
Access Code: [email or web address] URL: [email or web address] Date: 01/28/2020 Prepared by: Jethro Bastos  Exercises Standing Balance with Eyes Closed on Foam - 1 x daily - 5 x weekly - 1 sets - 30 reps - 30 hold Standing with Head Rotation on Pillow - 1 x daily - 5 x weekly - 2 sets - 10 reps Standing with Head Nod on Pillow - 1 x daily - 5 x weekly - 2 sets - 10 reps Standing Romberg to 1/2 Tandem Stance - 1 x daily - 5 x weekly - 1 sets - 10 reps Heel Toe Raises with Counter Support - 1 x daily - 5 x weekly - 3 sets - 5 reps

## 2020-01-28 NOTE — Therapy (Signed)
[facility] [address] [place], [place], [address] Phone: [phone]   Fax:  [phone]  Physical Therapy Treatment  Patient Details  Name: Katrina Rivera MRN: [id number] Date of Birth: 09-Oct-1945 Referring Provider (PT): Glean Salvo, NP   Encounter Date: 01/28/2020   PT End of Session - 01/28/20 0934    Visit Number 2    Number of Visits 7    Date for PT Re-Evaluation 03/04/20    Authorization Type [employer]    PT Start Time 0930    PT Stop Time 1014    PT Time Calculation (min) 44 min    Equipment Utilized During Treatment Gait belt    Activity Tolerance Patient tolerated treatment well    Behavior During Therapy WFL for tasks assessed/performed           Past Medical History:  Diagnosis Date   Addison's disease (HCC)    takes Solu Cortef daily   Anemia    takes Ferrous Sulfate daily   Anxiety    takes Xanax nightly   Arthritis    Chronic back pain    stenosis   Depression    takes Cymbalta daily   Dizziness    if b/p drops    Fibromyalgia    Fibromyalgia    GERD (gastroesophageal reflux disease)    Headache(784.0)    History of blood transfusion    no abnormal reaction noted   History of bronchitis    many yrs ago    Hypokalemia    takes Potassium daily   Hypotension    Hypotension    takes Florinef daily   IBS (irritable bowel syndrome)    takes Align daily   Insomnia    takes Trazodone nightly   Joint pain    Multiple sclerosis (HCC)    doesn't take any meds   Multiple sclerosis (HCC)    Nocturia    Osteoporosis    Peripheral neuropathy    Restless leg syndrome    Seasonal allergies    takes Zyrtec daily;uses Flonase daily as needed   Syncope    Urinary frequency    takes Flomax daily   Weakness    numbness and tingling    Past Surgical History:  Procedure Laterality Date   ABDOMINAL HYSTERECTOMY  1988   APPENDECTOMY   1988   CESAREAN SECTION  1973/1977   x2   CHOLECYSTECTOMY  1997   COLECTOMY  1990   COLONOSCOPY     ESOPHAGOGASTRODUODENOSCOPY     EYE SURGERY     bilateral - /w IOL   FRACTURE SURGERY Right    rods and screws   LUMBAR LAMINECTOMY/DECOMPRESSION MICRODISCECTOMY Left 01/24/2013   Procedure: LUMBAR FIVE TO SACRAL ONE LUMBAR LAMINECTOMY/DECOMPRESSION MICRODISCECTOMY 1 LEVEL;  Surgeon: Tia Alert, MD;  Location: [facility];  Service: Neurosurgery;  Laterality: Left;   MAXIMUM ACCESS (MAS)POSTERIOR LUMBAR INTERBODY FUSION (PLIF) 1 LEVEL N/A 06/18/2014   Procedure: MAXIMUM ACCESS SURGERY POSTERIOR LUMBAR INTERBODY FUSION LUMBAR FIVE TO SACRAL ONE ;  Surgeon: Tia Alert, MD;  Location: [facility];  Service: Neurosurgery;  Laterality: N/A;    There were no vitals filed for this visit.   Subjective Assessment - 01/28/20 0931    Subjective Patient reports continues to feel off balance of difference surfaces. Patient reports feel imbalanced on pavement. No falls to report.    Pertinent History MS, prior L knee fracture, R toe fracture, R hip fracture  Limitations Standing;Walking    How long can you sit comfortably? n/a    How long can you stand comfortably? n/a    How long can you walk comfortably? n/a    Patient Stated Goals Improve stability going between different surfaces    Currently in Pain? No/denies              [facility] Adult PT Treatment/Exercise - 01/28/20 0001      Transfers   Transfers Sit to Stand;Stand to Sit    Sit to Stand 6: Modified independent (Device/Increase time)    Stand to Sit 6: Modified independent (Device/Increase time)      Ambulation/Gait   Ambulation/Gait Yes    Ambulation/Gait Assistance 5: Supervision    Ambulation/Gait Assistance Details throughout therapy gym with activities    Assistive device None    Gait Pattern Step-through pattern;Decreased stance time - left;Decreased step length - left;Decreased dorsiflexion -  right;Decreased dorsiflexion - left;Left flexed knee in stance;Shuffle;Left foot flat;Right foot flat;Trunk flexed    Ambulation Surface Level;Indoor      Self-Care   Self-Care Other Self-Care Comments    Other Self-Care Comments  Patient asking about more supportive tennis shoes, PT educating on options for supportive shoes and provided contact information for [employer] store to further look into tennis shoe options.      Exercises   Exercises Other Exercises    Other Exercises  With light UE support from chair completed heel/toe raises 2 sets x 5 reps, verbal cues for proper technique/form with completion.               Balance Exercises - 01/28/20 0001      Balance Exercises: Standing   Standing Eyes Opened Narrow base of support (BOS);Foam/compliant surface;Head turns;Limitations    Standing Eyes Opened Limitations standing narrow BOS with eyes open on airex 3 x 30 seconds; completed wide BOS with horizontal/vertical head turns 1 x 10 reps. Increased sway with vertical > horizontal. intermittent UE support    Standing Eyes Closed Wide (BOA);Foam/compliant surface;3 reps;30 secs;Limitations    Standing Eyes Closed Limitations completed 3 x 30 seconds with eyes closed on airex. increased sway with vision removed.    Tandem Stance Eyes open;3 reps;30 secs;Limitations    Tandem Stance Time completed partial tandem stance on firm surface, 3  x 30 seconds bilaterally. increased challenge with RLE posterior.    Other Standing Exercises Comments Educated patient on balance HEP, PT showing patient how to set up corner with chair for improved safety with balance activites with completion at home.          Initial HEP Established:   Access Code: [email or web address] URL: [email or web address] Date: 01/28/2020 Prepared by: Jethro Bastos  Exercises Standing Balance with Eyes Closed on Foam - 1 x daily - 5 x weekly - 1 sets - 30 reps - 30 hold Standing with Head Rotation on Pillow -  1 x daily - 5 x weekly - 2 sets - 10 reps Standing with Head Nod on Pillow - 1 x daily - 5 x weekly - 2 sets - 10 reps Standing Romberg to 1/2 Tandem Stance - 1 x daily - 5 x weekly - 1 sets - 10 reps Heel Toe Raises with Counter Support - 1 x daily - 5 x weekly - 3 sets - 5 reps    PT Education - 01/28/20 1004    Education Details Initial HEP    Person(s) Educated Patient    Methods Explanation;Demonstration;Handout  Comprehension Verbalized understanding;Returned demonstration            PT Short Term Goals - 01/22/20 1432      PT SHORT TERM GOAL #1   Title Pt will be independent with initial HEP    Time 3    Period Weeks    Status New    Target Date 02/12/20      PT SHORT TERM GOAL #2   Title Pt will improve her 5x STS to </=12 sec    Baseline 14.22 sec on eval    Time 3    Period Weeks    Status New    Target Date 02/12/20      PT SHORT TERM GOAL #3   Title Pt will be able to demo heel strike during gait without cueing    Time 3    Period Weeks    Status New    Target Date 02/12/20             PT Long Term Goals - 01/22/20 1433      PT LONG TERM GOAL #1   Title Pt will be independent with advanced HEP    Time 6    Period Weeks    Status New    Target Date 03/04/20      PT LONG TERM GOAL #2   Title Pt will improve her DGI score to at least 20/24 to demo decreased fall risk    Baseline 17/24 on 01/22/20    Time 6    Period Weeks    Status New    Target Date 03/04/20      PT LONG TERM GOAL #3   Title Pt will improve gait speed by at least .4 ft/sec to demo minimal clinically important difference for community mobility    Baseline 1.95 ft/sec    Time 6    Period Weeks    Status New    Target Date 02/12/20                 Plan - 01/28/20 1021    Clinical Impression Statement Today's skilled PT session included focus on balance activities and establishing initial balance HEP. Patient having increased balance challenge on complaint  surfaces and with vision removed. Will continue to progress HEP as tolerated, and continue per POC.    Personal Factors and Comorbidities Age;Comorbidity 1;Comorbidity 2    Comorbidities MS, history of multiple fractures (R hip, R toes, L knee)    Examination-Activity Limitations Locomotion Level;Squat;Stairs    Examination-Participation Restrictions Community Activity;Yard Work    Conservation officer, historic buildings Evolving/Moderate complexity    Rehab Potential Good    PT Frequency 1x / week    PT Duration 6 weeks    PT Treatment/Interventions Aquatic Therapy;Cryotherapy;Electrical Stimulation;Moist Heat;DME Instruction;Ultrasound;Gait training;Stair training;Functional mobility training;Therapeutic activities;Therapeutic exercise;Balance training;Neuromuscular re-education;Patient/family education;Manual techniques;Orthotic Fit/Training;Dry needling;Taping;Vestibular;Passive range of motion    PT Next Visit Plan How was HEP? balance exercises. Work on ankle strengthening. Work on ankle strategies/stepping strategy. Rockerboard    Consulted and Agree with Plan of Care Patient           Patient will benefit from skilled therapeutic intervention in order to improve the following deficits and impairments:  Abnormal gait,Difficulty walking,Decreased coordination,Decreased balance,Decreased mobility,Decreased strength,Postural dysfunction  Visit Diagnosis: Muscle weakness (generalized)  Difficulty in walking, not elsewhere classified  Other abnormalities of gait and mobility     Problem List Patient Active Problem List   Diagnosis Date Noted   Restless leg  syndrome    Chronic back pain    Low back pain without sciatica 11/01/2018   Essential hypertension    SBO (small bowel obstruction) (HCC) 09/01/2016   Fibromyalgia    Abdominal pain, vomiting, and diarrhea    Dehydration    Somnolence, daytime 10/22/2015   Fatigue 10/22/2015   Chronic leg pain 06/29/2015   S/P  lumbar spinal fusion 06/18/2014   Abnormality of gait 02/18/2013   S/P lumbar microdiscectomy 01/24/2013   Hypokalemia 12/24/2012   Sweating abnormality 10/19/2012   Dysautonomia orthostatic hypotension syndrome (HCC) 08/16/2012   Hereditary and idiopathic peripheral neuropathy 08/16/2012   Chronic adrenal insufficiency (HCC) 08/16/2012   MS (multiple sclerosis) (HCC) 08/15/2012    Tempie Donning, PT, DPT 01/28/2020, 10:25 AM   [facility] [address] [place], [place], [address] Phone: [phone]   Fax:  [phone]  Name: Katrina Rivera MRN: [id number] Date of Birth: September 17, 1945

## 2020-02-03 ENCOUNTER — Other Ambulatory Visit: Payer: Self-pay

## 2020-02-03 ENCOUNTER — Ambulatory Visit: Payer: PPO | Admitting: Physical Therapy

## 2020-02-03 DIAGNOSIS — M6281 Muscle weakness (generalized): Secondary | ICD-10-CM | POA: Diagnosis not present

## 2020-02-03 DIAGNOSIS — R2689 Other abnormalities of gait and mobility: Secondary | ICD-10-CM

## 2020-02-03 DIAGNOSIS — R262 Difficulty in walking, not elsewhere classified: Secondary | ICD-10-CM

## 2020-02-03 NOTE — Therapy (Signed)
[facility] [address] [place], [place], [address] Phone: [phone]   Fax:  [phone]  Physical Therapy Treatment  Patient Details  Name: ABBEE Rivera MRN: [id number] Date of Birth: [birth year] Referring Provider (PT): Glean Salvo, NP   Encounter Date: 02/03/2020   PT End of Session - 02/03/20 1205    Visit Number 3    Number of Visits 7    Date for PT Re-Evaluation 03/04/20    Authorization Type [employer]    PT Start Time 1155    PT Stop Time 1235    PT Time Calculation (min) 40 min    Equipment Utilized During Treatment Gait belt    Activity Tolerance Patient tolerated treatment well    Behavior During Therapy [facility] for tasks assessed/performed           Past Medical History:  Diagnosis Date  . Addison's disease (HCC)    takes Solu Cortef daily  . Anemia    takes Ferrous Sulfate daily  . Anxiety    takes Xanax nightly  . Arthritis   . Chronic back pain    stenosis  . Depression    takes Cymbalta daily  . Dizziness    if b/p drops   . Fibromyalgia   . Fibromyalgia   . GERD (gastroesophageal reflux disease)   . Headache(784.0)   . History of blood transfusion    no abnormal reaction noted  . History of bronchitis    many yrs ago   . Hypokalemia    takes Potassium daily  . Hypotension   . Hypotension    takes Florinef daily  . IBS (irritable bowel syndrome)    takes Librarian, academic daily  . Insomnia    takes Trazodone nightly  . Joint pain   . Multiple sclerosis (HCC)    doesn't take any meds  . Multiple sclerosis (HCC)   . Nocturia   . Osteoporosis   . Peripheral neuropathy   . Restless leg syndrome   . Seasonal allergies    takes Zyrtec daily;uses Flonase daily as needed  . Syncope   . Urinary frequency    takes Flomax daily  . Weakness    numbness and tingling    Past Surgical History:  Procedure Laterality Date  . ABDOMINAL HYSTERECTOMY  1988  . APPENDECTOMY   1988  . CESAREAN SECTION  1973/1977   x2  . CHOLECYSTECTOMY  1997  . COLECTOMY  1990  . COLONOSCOPY    . ESOPHAGOGASTRODUODENOSCOPY    . EYE SURGERY     bilateral - /w IOL  . FRACTURE SURGERY Right    rods and screws  . LUMBAR LAMINECTOMY/DECOMPRESSION MICRODISCECTOMY Left 01/24/2013   Procedure: LUMBAR FIVE TO SACRAL ONE LUMBAR LAMINECTOMY/DECOMPRESSION MICRODISCECTOMY 1 LEVEL;  Surgeon: Tia Alert, MD;  Location: [facility];  Service: Neurosurgery;  Laterality: Left;  Marland Kitchen MAXIMUM ACCESS (MAS)POSTERIOR LUMBAR INTERBODY FUSION (PLIF) 1 LEVEL N/A 06/18/2014   Procedure: MAXIMUM ACCESS SURGERY POSTERIOR LUMBAR INTERBODY FUSION LUMBAR FIVE TO SACRAL ONE ;  Surgeon: Tia Alert, MD;  Location: [facility];  Service: Neurosurgery;  Laterality: N/A;    There were no vitals filed for this visit.   Subjective Assessment - 02/03/20 1159    Subjective Pt reports nothing new or different. Pt states she tried doing her exercises but had limited time due to Christmas. Pt notes she had a hard time with performing balance on the pillow and has ordered her  own foam.    Pertinent History MS, prior L knee fracture, R toe fracture, R hip fracture    Limitations Standing;Walking    How long can you sit comfortably? n/a    How long can you stand comfortably? n/a    How long can you walk comfortably? n/a    Patient Stated Goals Improve stability going between different surfaces    Currently in Pain? No/denies              Seated:  3 way ankle (inv/ev/DF) with red tband x10  Standing:  Heel/toe raise 2x10  Partial tandem 2x30 sec bilat  Feet together EO & then EC head nods x15 sec  Feet together EC head rotation x30 sec  EC turning to look over shoulder x10  Standing on foam:  EO head nods x30 sec  EO head rotation x30 sec  EC 2x30 sec  Standing on rockerboard with intermittent UE support:  EO rockerboard in A/P direction x30 sec hold  EO rockerboard forward/backward weightshift  x10 reps  EO rockerboard in L<>R direction x30 sec hold  EO rockerboard L<>R weight shift x10 reps                        PT Short Term Goals - 01/22/20 1432      PT SHORT TERM GOAL #1   Title Pt will be independent with initial HEP    Time 3    Period Weeks    Status New    Target Date 02/12/20      PT SHORT TERM GOAL #2   Title Pt will improve her 5x STS to </=12 sec    Baseline 14.22 sec on eval    Time 3    Period Weeks    Status New    Target Date 02/12/20      PT SHORT TERM GOAL #3   Title Pt will be able to demo heel strike during gait without cueing    Time 3    Period Weeks    Status New    Target Date 02/12/20             PT Long Term Goals - 01/22/20 1433      PT LONG TERM GOAL #1   Title Pt will be independent with advanced HEP    Time 6    Period Weeks    Status New    Target Date 03/04/20      PT LONG TERM GOAL #2   Title Pt will improve her DGI score to at least 20/24 to demo decreased fall risk    Baseline 17/24 on 01/22/20    Time 6    Period Weeks    Status New    Target Date 03/04/20      PT LONG TERM GOAL #3   Title Pt will improve gait speed by at least .4 ft/sec to demo minimal clinically important difference for community mobility    Baseline 1.95 ft/sec    Time 6    Period Weeks    Status New    Target Date 02/12/20                 Plan - 02/03/20 1454    Clinical Impression Statement Today's session focused on ankle strengthening and balance activities. L ankle found to be slightly weaker and compensates more than R ankle. Pt has not had a chance to truly attempt HEP; encouraged pt to  perform as able to obtain best results.    Personal Factors and Comorbidities Age;Comorbidity 1;Comorbidity 2    Comorbidities MS, history of multiple fractures (R hip, R toes, L knee)    Examination-Activity Limitations Locomotion Level;Squat;Stairs    Examination-Participation Restrictions Community Activity;Yard Work     Conservation officer, historic buildings Evolving/Moderate complexity    Rehab Potential Good    PT Frequency 1x / week    PT Duration 6 weeks    PT Treatment/Interventions Aquatic Therapy;Cryotherapy;Electrical Stimulation;Moist Heat;DME Instruction;Ultrasound;Gait training;Stair training;Functional mobility training;Therapeutic activities;Therapeutic exercise;Balance training;Neuromuscular re-education;Patient/family education;Manual techniques;Orthotic Fit/Training;Dry needling;Taping;Vestibular;Passive range of motion    PT Next Visit Plan How was HEP? balance exercises. Work on ankle strengthening. Work on ankle strategies/stepping strategy. Rockerboard    Consulted and Agree with Plan of Care Patient           Patient will benefit from skilled therapeutic intervention in order to improve the following deficits and impairments:  Abnormal gait,Difficulty walking,Decreased coordination,Decreased balance,Decreased mobility,Decreased strength,Postural dysfunction  Visit Diagnosis: No diagnosis found.     Problem List Patient Active Problem List   Diagnosis Date Noted  . Restless leg syndrome   . Chronic back pain   . Low back pain without sciatica 11/01/2018  . Essential hypertension   . SBO (small bowel obstruction) (HCC) 09/01/2016  . Fibromyalgia   . Abdominal pain, vomiting, and diarrhea   . Dehydration   . Somnolence, daytime 10/22/2015  . Fatigue 10/22/2015  . Chronic leg pain 06/29/2015  . S/P lumbar spinal fusion 06/18/2014  . Abnormality of gait 02/18/2013  . S/P lumbar microdiscectomy 01/24/2013  . Hypokalemia 12/24/2012  . Sweating abnormality 10/19/2012  . Dysautonomia orthostatic hypotension syndrome (HCC) 08/16/2012  . Hereditary and idiopathic peripheral neuropathy 08/16/2012  . Chronic adrenal insufficiency (HCC) 08/16/2012  . MS (multiple sclerosis) (HCC) 08/15/2012    Yazlynn Birkeland April Ma L Brady PT, DPT 02/03/2020, 2:56 PM  [facility] [address] [place], [place], [address] Phone: [phone]   Fax:  [phone]  Name: Katrina Rivera MRN: [id number] Date of Birth: 03-02-1945

## 2020-02-04 DIAGNOSIS — T85192A Other mechanical complication of implanted electronic neurostimulator (electrode) of spinal cord, initial encounter: Secondary | ICD-10-CM | POA: Diagnosis not present

## 2020-02-04 DIAGNOSIS — J069 Acute upper respiratory infection, unspecified: Secondary | ICD-10-CM | POA: Diagnosis not present

## 2020-02-05 DIAGNOSIS — J069 Acute upper respiratory infection, unspecified: Secondary | ICD-10-CM | POA: Diagnosis not present

## 2020-02-05 DIAGNOSIS — Z20822 Contact with and (suspected) exposure to covid-19: Secondary | ICD-10-CM | POA: Diagnosis not present

## 2020-02-10 ENCOUNTER — Ambulatory Visit: Payer: PPO | Admitting: Physical Therapy

## 2020-02-12 ENCOUNTER — Other Ambulatory Visit: Payer: Self-pay | Admitting: Endocrinology

## 2020-02-17 ENCOUNTER — Ambulatory Visit: Payer: PPO | Attending: Neurology | Admitting: Physical Therapy

## 2020-02-17 ENCOUNTER — Other Ambulatory Visit: Payer: Self-pay

## 2020-02-17 DIAGNOSIS — R2689 Other abnormalities of gait and mobility: Secondary | ICD-10-CM | POA: Diagnosis not present

## 2020-02-17 DIAGNOSIS — R262 Difficulty in walking, not elsewhere classified: Secondary | ICD-10-CM | POA: Diagnosis not present

## 2020-02-17 DIAGNOSIS — M6281 Muscle weakness (generalized): Secondary | ICD-10-CM | POA: Diagnosis not present

## 2020-02-17 DIAGNOSIS — Z1152 Encounter for screening for COVID-19: Secondary | ICD-10-CM | POA: Diagnosis not present

## 2020-02-17 NOTE — Therapy (Signed)
[facility] [address] [place], [place], [address] Phone: [phone]   Fax:  [phone]  Physical Therapy Treatment  Patient Details  Name: Katrina Rivera MRN: [id number] Date of Birth: [birth year] Referring Provider (PT): Suzzanne Cloud, NP   Encounter Date: 02/17/2020   PT End of Session - 02/17/20 1157    Visit Number 4    Number of Visits 7    Date for PT Re-Evaluation 03/04/20    Authorization Type [employer]    PT Start Time 1107    PT Stop Time 1150    PT Time Calculation (min) 43 min    Equipment Utilized During Treatment Gait belt    Activity Tolerance Patient tolerated treatment well    Behavior During Therapy [facility] for tasks assessed/performed           Past Medical History:  Diagnosis Date  . Addison's disease ([place])    takes Solu Cortef daily  . Anemia    takes Ferrous Sulfate daily  . Anxiety    takes Xanax nightly  . Arthritis   . Chronic back pain    stenosis  . Depression    takes Cymbalta daily  . Dizziness    if b/p drops   . Fibromyalgia   . Fibromyalgia   . GERD (gastroesophageal reflux disease)   . Headache(784.0)   . History of blood transfusion    no abnormal reaction noted  . History of bronchitis    many yrs ago   . Hypokalemia    takes Potassium daily  . Hypotension   . Hypotension    takes Florinef daily  . IBS (irritable bowel syndrome)    takes Electronics engineer daily  . Insomnia    takes Trazodone nightly  . Joint pain   . Multiple sclerosis ([place])    doesn't take any meds  . Multiple sclerosis ([place])   . Nocturia   . Osteoporosis   . Peripheral neuropathy   . Restless leg syndrome   . Seasonal allergies    takes Zyrtec daily;uses Flonase daily as needed  . Syncope   . Urinary frequency    takes Flomax daily  . Weakness    numbness and tingling    Past Surgical History:  Procedure Laterality Date  . ABDOMINAL HYSTERECTOMY  1988  . APPENDECTOMY   1988  . CESAREAN SECTION  1973/1977   x2  . CHOLECYSTECTOMY  1997  . COLECTOMY  1990  . COLONOSCOPY    . ESOPHAGOGASTRODUODENOSCOPY    . EYE SURGERY     bilateral - /w IOL  . FRACTURE SURGERY Right    rods and screws  . LUMBAR LAMINECTOMY/DECOMPRESSION MICRODISCECTOMY Left 01/24/2013   Procedure: LUMBAR FIVE TO SACRAL ONE LUMBAR LAMINECTOMY/DECOMPRESSION MICRODISCECTOMY 1 LEVEL;  Surgeon: Eustace Moore, MD;  Location: [place] [facility];  Service: Neurosurgery;  Laterality: Left;  Marland Kitchen MAXIMUM ACCESS (MAS)POSTERIOR LUMBAR INTERBODY FUSION (PLIF) 1 LEVEL N/A 06/18/2014   Procedure: MAXIMUM ACCESS SURGERY POSTERIOR LUMBAR INTERBODY FUSION LUMBAR FIVE TO SACRAL ONE ;  Surgeon: Eustace Moore, MD;  Location: [place] [facility];  Service: Neurosurgery;  Laterality: N/A;    There were no vitals filed for this visit.   Subjective Assessment - 02/17/20 1110    Subjective Pt reports nothing new or different. Pt states that the ankle exercises felt a little easier at home. Pt states closing her eyes still feels pretty challenging.    Pertinent History MS, prior L knee fracture,  R toe fracture, R hip fracture    Limitations Standing;Walking    How long can you sit comfortably? n/a    How long can you stand comfortably? n/a    How long can you walk comfortably? n/a    Patient Stated Goals Improve stability going between different surfaces    Currently in Pain? No/denies               Standing on foam:  EO & EC head nods x30 sec each  EO & EC head rotation x30 sec each  EC 2x30 sec  Tandem stance 2x15 sec  Sit<>stand 2x5 reps  Stepping forward/backward over black foam working on heel strike and step height x10 bilat on solid groun; x5 on foam bilat Stepping forward/backward over 2 black foam obstacles x5 bilat  Ambulating around gym:  13' with head turns CGA; pt slows down gait or moves whole body/shoulders with head turns  115' with head nods CGA; pt slows down gait  115' with intermittent  "fast" walking CGA; cues for heel strike                  [facility] Adult PT Treatment/Exercise - 02/17/20 0001      Ambulation/Gait   Ambulation/Gait Assistance 5: Supervision    Assistive device None    Gait Pattern Step-through pattern;Decreased stance time - left;Decreased step length - left;Decreased dorsiflexion - right;Decreased dorsiflexion - left;Left flexed knee in stance;Shuffle;Left foot flat;Right foot flat;Trunk flexed    Ambulation Surface Level;Indoor    Gait velocity 2.15 ft/sec                    PT Short Term Goals - 02/17/20 1123      PT SHORT TERM GOAL #1   Title Pt will be independent with initial HEP    Time 3    Period Weeks    Status Achieved    Target Date 02/12/20      PT SHORT TERM GOAL #2   Title Pt will improve her 5x STS to </=12 sec    Baseline 14.22 sec on eval; 11.21 sec on 02/17/20    Time 3    Period Weeks    Status Achieved    Target Date 02/12/20      PT SHORT TERM GOAL #3   Title Pt will be able to demo heel strike during gait without cueing    Baseline requires cueing    Time 3    Period Weeks    Status On-going    Target Date 02/12/20             PT Long Term Goals - 01/22/20 1433      PT LONG TERM GOAL #1   Title Pt will be independent with advanced HEP    Time 6    Period Weeks    Status New    Target Date 03/04/20      PT LONG TERM GOAL #2   Title Pt will improve her DGI score to at least 20/24 to demo decreased fall risk    Baseline 17/24 on 01/22/20    Time 6    Period Weeks    Status New    Target Date 03/04/20      PT LONG TERM GOAL #3   Title Pt will improve gait speed by at least .4 ft/sec to demo minimal clinically important difference for community mobility    Baseline 1.95 ft/sec    Time 6  Period Weeks    Status New    Target Date 02/12/20                 Plan - 02/17/20 1201    Clinical Impression Statement Today's session continues to focus on ankle strengthening,  balance, heel strike, and gait. Pt with improving static standing balance on foam with eyes closed. Pt remains challenged with dynamic gait activities with head turns. Pt continues to be challenged with maintaining balance with weight shifting tasks. Pt has met STG #1 and #2. Pt still requires cueing for heel strike.    Personal Factors and Comorbidities Age;Comorbidity 1;Comorbidity 2    Comorbidities MS, history of multiple fractures (R hip, R toes, L knee)    Examination-Activity Limitations Locomotion Level;Squat;Stairs    Examination-Participation Restrictions Community Activity;Yard Work    Merchant navy officer Evolving/Moderate complexity    Rehab Potential Good    PT Frequency 1x / week    PT Duration 6 weeks    PT Treatment/Interventions Aquatic Therapy;Cryotherapy;Electrical Stimulation;Moist Heat;DME Instruction;Ultrasound;Gait training;Stair training;Functional mobility training;Therapeutic activities;Therapeutic exercise;Balance training;Neuromuscular re-education;Patient/family education;Manual techniques;Orthotic Fit/Training;Dry needling;Taping;Vestibular;Passive range of motion    PT Next Visit Plan How was HEP? balance exercises. Work on ankle strengthening. Work on ankle strategies/stepping strategy. Rockerboard    Consulted and Agree with Plan of Care Patient           Patient will benefit from skilled therapeutic intervention in order to improve the following deficits and impairments:  Abnormal gait,Difficulty walking,Decreased coordination,Decreased balance,Decreased mobility,Decreased strength,Postural dysfunction  Visit Diagnosis: Muscle weakness (generalized)  Difficulty in walking, not elsewhere classified  Other abnormalities of gait and mobility     Problem List Patient Active Problem List   Diagnosis Date Noted  . Restless leg syndrome   . Chronic back pain   . Low back pain without sciatica 11/01/2018  . Essential hypertension   . SBO  (small bowel obstruction) ([place]) 09/01/2016  . Fibromyalgia   . Abdominal pain, vomiting, and diarrhea   . Dehydration   . Somnolence, daytime 10/22/2015  . Fatigue 10/22/2015  . Chronic leg pain 06/29/2015  . S/P lumbar spinal fusion 06/18/2014  . Abnormality of gait 02/18/2013  . S/P lumbar microdiscectomy 01/24/2013  . Hypokalemia 12/24/2012  . Sweating abnormality 10/19/2012  . Dysautonomia orthostatic hypotension syndrome ([place]) 08/16/2012  . Hereditary and idiopathic peripheral neuropathy 08/16/2012  . Chronic adrenal insufficiency ([place]) 08/16/2012  . MS (multiple sclerosis) ([place]) 08/15/2012    Zykee Avakian April Ma L [place] PT, DPT 02/17/2020, 12:06 PM  [facility] [address] [place], [place], [address] Phone: [phone]   Fax:  [phone]  Name: Katrina Rivera MRN: [id number] Date of Birth: [birth year]

## 2020-02-24 ENCOUNTER — Ambulatory Visit: Payer: PPO | Admitting: Physical Therapy

## 2020-03-02 ENCOUNTER — Other Ambulatory Visit: Payer: Self-pay

## 2020-03-02 ENCOUNTER — Ambulatory Visit: Payer: PPO

## 2020-03-02 DIAGNOSIS — M6281 Muscle weakness (generalized): Secondary | ICD-10-CM

## 2020-03-02 DIAGNOSIS — R262 Difficulty in walking, not elsewhere classified: Secondary | ICD-10-CM

## 2020-03-02 DIAGNOSIS — R2689 Other abnormalities of gait and mobility: Secondary | ICD-10-CM

## 2020-03-02 NOTE — Therapy (Signed)
[facility] [address] [place], [place], [address] Phone: [phone]   Fax:  [phone]  Physical Therapy Treatment  Patient Details  Name: Katrina Rivera MRN: [id number] Date of Birth: [birth year] Referring Provider (PT): Glean Salvo, NP   Encounter Date: 03/02/2020   PT End of Session - 03/02/20 1020    Visit Number 5    Number of Visits 7    Date for PT Re-Evaluation 03/04/20    Authorization Type [employer]    PT Start Time 1018    PT Stop Time 1100    PT Time Calculation (min) 42 min    Equipment Utilized During Treatment Gait belt    Activity Tolerance Patient tolerated treatment well    Behavior During Therapy [facility] for tasks assessed/performed           Past Medical History:  Diagnosis Date  . Addison's disease (HCC)    takes Solu Cortef daily  . Anemia    takes Ferrous Sulfate daily  . Anxiety    takes Xanax nightly  . Arthritis   . Chronic back pain    stenosis  . Depression    takes Cymbalta daily  . Dizziness    if b/p drops   . Fibromyalgia   . Fibromyalgia   . GERD (gastroesophageal reflux disease)   . Headache(784.0)   . History of blood transfusion    no abnormal reaction noted  . History of bronchitis    many yrs ago   . Hypokalemia    takes Potassium daily  . Hypotension   . Hypotension    takes Florinef daily  . IBS (irritable bowel syndrome)    takes Librarian, academic daily  . Insomnia    takes Trazodone nightly  . Joint pain   . Multiple sclerosis (HCC)    doesn't take any meds  . Multiple sclerosis (HCC)   . Nocturia   . Osteoporosis   . Peripheral neuropathy   . Restless leg syndrome   . Seasonal allergies    takes Zyrtec daily;uses Flonase daily as needed  . Syncope   . Urinary frequency    takes Flomax daily  . Weakness    numbness and tingling    Past Surgical History:  Procedure Laterality Date  . ABDOMINAL HYSTERECTOMY  1988  . APPENDECTOMY   1988  . CESAREAN SECTION  1973/1977   x2  . CHOLECYSTECTOMY  1997  . COLECTOMY  1990  . COLONOSCOPY    . ESOPHAGOGASTRODUODENOSCOPY    . EYE SURGERY     bilateral - /w IOL  . FRACTURE SURGERY Right    rods and screws  . LUMBAR LAMINECTOMY/DECOMPRESSION MICRODISCECTOMY Left 01/24/2013   Procedure: LUMBAR FIVE TO SACRAL ONE LUMBAR LAMINECTOMY/DECOMPRESSION MICRODISCECTOMY 1 LEVEL;  Surgeon: Tia Alert, MD;  Location: [facility];  Service: Neurosurgery;  Laterality: Left;  Marland Kitchen MAXIMUM ACCESS (MAS)POSTERIOR LUMBAR INTERBODY FUSION (PLIF) 1 LEVEL N/A 06/18/2014   Procedure: MAXIMUM ACCESS SURGERY POSTERIOR LUMBAR INTERBODY FUSION LUMBAR FIVE TO SACRAL ONE ;  Surgeon: Tia Alert, MD;  Location: [facility];  Service: Neurosurgery;  Laterality: N/A;    There were no vitals filed for this visit.   Subjective Assessment - 03/02/20 1020    Subjective Patient reports no new changes. No falls, but has tripped once or twice.    Pertinent History MS, prior L knee fracture, R toe fracture, R hip fracture    Limitations Standing;Walking  How long can you sit comfortably? n/a    How long can you stand comfortably? n/a    How long can you walk comfortably? n/a    Patient Stated Goals Improve stability going between different surfaces    Currently in Pain? No/denies                [facility] Adult PT Treatment/Exercise - 03/02/20 0001      Transfers   Transfers Sit to Stand;Stand to Sit    Sit to Stand 5: Supervision    Stand to Sit 5: Supervision    Comments completed 1 x 10 reps of sit <> stand with BLE placed on airex. verbal cues for control with completion.      Ambulation/Gait   Ambulation/Gait Yes    Ambulation/Gait Assistance 5: Supervision    Ambulation/Gait Assistance Details completed ambulation x 345 ft working toward improved heel toe pattern. increased knee flexion noted demo increaed difficulty to complete heel toe pattern.    Ambulation Distance (Feet) 345 Feet     Assistive device None    Gait Pattern Step-through pattern;Decreased stance time - left;Decreased step length - left;Decreased dorsiflexion - right;Decreased dorsiflexion - left;Left flexed knee in stance;Shuffle;Left foot flat;Right foot flat;Trunk flexed    Ambulation Surface Level;Indoor    Curb 5: Supervision    Curb Details (indicate cue type and reason) worked on Hydrographic surveyor (indoors) completed x 8 reps. PT providing vebral cues for sequencing to promote improved safety with completion.      Exercises   Other Exercises  Completed B hamstring stretch 3 x 30 seconds each, verbal cues for form required.               Balance Exercises - 03/02/20 0001      Balance Exercises: Standing   Standing Eyes Opened Narrow base of support (BOS);Foam/compliant surface;Head turns;Limitations    Standing Eyes Opened Limitations standing narrow BOS with eyes open on airex horizontal/vertical head turns 2 x 10 reps. Increased sway with vertical > horizontal. intermittent UE support    Standing Eyes Closed Wide (BOA);Foam/compliant surface;3 reps;30 secs;Limitations    Standing Eyes Closed Limitations completed 3 x 30 seconds with eyes closed on airex. increased sway with vision removed. compelted horizontal/vertical head turns x 10 reps    Stepping Strategy Anterior;Posterior;Foam/compliant surface;Limitations    Stepping Strategy Limitations completed x 10 reps of anterior/posterior stepping strategy, increased UE support initially but progressing to reduced UE support    Rockerboard Anterior/posterior;EO;Intermittent UE support;Limitations    Rockerboard Limitations completed A/P weight shift on rockerboard x 20 reps, intermittent UE support.    Step Ups Forward;6 inch;UE support 1;Limitations    Step Ups Limitations completed forward steps up to 6" step completed x 10 reps, single UE support.             PT Education - 03/02/20 1130    Education Details educated patient to complete  balance exercises in a corner for improved safety    Person(s) Educated Patient    Methods Explanation    Comprehension Verbalized understanding            PT Short Term Goals - 02/17/20 1123      PT SHORT TERM GOAL #1   Title Pt will be independent with initial HEP    Time 3    Period Weeks    Status Achieved    Target Date 02/12/20      PT SHORT TERM GOAL #2   Title Pt  will improve her 5x STS to </=12 sec    Baseline 14.22 sec on eval; 11.21 sec on 02/17/20    Time 3    Period Weeks    Status Achieved    Target Date 02/12/20      PT SHORT TERM GOAL #3   Title Pt will be able to demo heel strike during gait without cueing    Baseline requires cueing    Time 3    Period Weeks    Status On-going    Target Date 02/12/20             PT Long Term Goals - 03/02/20 1106      PT LONG TERM GOAL #1   Title Pt will be independent with advanced HEP    Time 6    Period Weeks    Status New    Target Date 03/16/20      PT LONG TERM GOAL #2   Title Pt will improve her DGI score to at least 20/24 to demo decreased fall risk    Baseline 17/24 on 01/22/20    Time 6    Period Weeks    Status New      PT LONG TERM GOAL #3   Title Pt will improve gait speed by at least .4 ft/sec to demo minimal clinically important difference for community mobility    Baseline 1.95 ft/sec    Time 6    Period Weeks    Status New                 Plan - 03/02/20 1104    Clinical Impression Statement Added two visits due to patient missing/canceling to prior appointments, LTG date updated. Continued gait trianing and curb training working toward improved negotiation and heel toe pattern. Continued high level balance working on complaint surfaces, continue to have increased challenge with vision removed.    Personal Factors and Comorbidities Age;Comorbidity 1;Comorbidity 2    Comorbidities MS, history of multiple fractures (R hip, R toes, L knee)    Examination-Activity Limitations  Locomotion Level;Squat;Stairs    Examination-Participation Restrictions Community Activity;Yard Work    Conservation officer, historic buildings Evolving/Moderate complexity    Rehab Potential Good    PT Frequency 1x / week    PT Duration 6 weeks    PT Treatment/Interventions Aquatic Therapy;Cryotherapy;Electrical Stimulation;Moist Heat;DME Instruction;Ultrasound;Gait training;Stair training;Functional mobility training;Therapeutic activities;Therapeutic exercise;Balance training;Neuromuscular re-education;Patient/family education;Manual techniques;Orthotic Fit/Training;Dry needling;Taping;Vestibular;Passive range of motion    PT Next Visit Plan Will need Re-Cert for last two visits. Complete balance outdoors if weather allows, including curb negotiation. Continue balance exercises. Work on ankle strengthening. Work on ankle strategies/stepping strategy. Rockerboard    Consulted and Agree with Plan of Care Patient           Patient will benefit from skilled therapeutic intervention in order to improve the following deficits and impairments:  Abnormal gait,Difficulty walking,Decreased coordination,Decreased balance,Decreased mobility,Decreased strength,Postural dysfunction  Visit Diagnosis: Muscle weakness (generalized)  Difficulty in walking, not elsewhere classified  Other abnormalities of gait and mobility     Problem List Patient Active Problem List   Diagnosis Date Noted  . Restless leg syndrome   . Chronic back pain   . Low back pain without sciatica 11/01/2018  . Essential hypertension   . SBO (small bowel obstruction) (HCC) 09/01/2016  . Fibromyalgia   . Abdominal pain, vomiting, and diarrhea   . Dehydration   . Somnolence, daytime 10/22/2015  . Fatigue 10/22/2015  . Chronic leg pain  06/29/2015  . S/P lumbar spinal fusion 06/18/2014  . Abnormality of gait 02/18/2013  . S/P lumbar microdiscectomy 01/24/2013  . Hypokalemia 12/24/2012  . Sweating abnormality 10/19/2012  .  Dysautonomia orthostatic hypotension syndrome (HCC) 08/16/2012  . Hereditary and idiopathic peripheral neuropathy 08/16/2012  . Chronic adrenal insufficiency (HCC) 08/16/2012  . MS (multiple sclerosis) (HCC) 08/15/2012    Tempie DonningKaitlyn B Kyliah Deanda, PT, DPT 03/02/2020, 11:32 AM  [facility] [address] [place], [place], [address] Phone: [phone]   Fax:  [phone]  Name: Iven FinnLinda M Mcclatchy MRN: [id number] Date of Birth: May 28, 1945

## 2020-03-09 ENCOUNTER — Ambulatory Visit: Payer: PPO

## 2020-03-09 ENCOUNTER — Other Ambulatory Visit: Payer: Self-pay

## 2020-03-09 DIAGNOSIS — R2689 Other abnormalities of gait and mobility: Secondary | ICD-10-CM

## 2020-03-09 DIAGNOSIS — M6281 Muscle weakness (generalized): Secondary | ICD-10-CM | POA: Diagnosis not present

## 2020-03-09 DIAGNOSIS — R262 Difficulty in walking, not elsewhere classified: Secondary | ICD-10-CM

## 2020-03-09 NOTE — Therapy (Signed)
[facility] [address] [place], [place], [address] Phone: [phone]   Fax:  [phone]  Physical Therapy Treatment  Patient Details  Name: Katrina Rivera MRN: [id number] Date of Birth: 25-Jun-1945 Referring Provider (PT): Glean Salvo, NP   Encounter Date: 03/09/2020   PT End of Session - 03/09/20 1317    Visit Number 6    Number of Visits 7    Date for PT Re-Evaluation 04/08/20   POC for 2 weeks, Cert for 30 days   Authorization Type [employer]    PT Start Time 1317    PT Stop Time 1400    PT Time Calculation (min) 43 min    Equipment Utilized During Treatment Gait belt    Activity Tolerance Patient tolerated treatment well    Behavior During Therapy [facility] for tasks assessed/performed           Past Medical History:  Diagnosis Date  . Addison's disease (HCC)    takes Solu Cortef daily  . Anemia    takes Ferrous Sulfate daily  . Anxiety    takes Xanax nightly  . Arthritis   . Chronic back pain    stenosis  . Depression    takes Cymbalta daily  . Dizziness    if b/p drops   . Fibromyalgia   . Fibromyalgia   . GERD (gastroesophageal reflux disease)   . Headache(784.0)   . History of blood transfusion    no abnormal reaction noted  . History of bronchitis    many yrs ago   . Hypokalemia    takes Potassium daily  . Hypotension   . Hypotension    takes Florinef daily  . IBS (irritable bowel syndrome)    takes Librarian, academic daily  . Insomnia    takes Trazodone nightly  . Joint pain   . Multiple sclerosis (HCC)    doesn't take any meds  . Multiple sclerosis (HCC)   . Nocturia   . Osteoporosis   . Peripheral neuropathy   . Restless leg syndrome   . Seasonal allergies    takes Zyrtec daily;uses Flonase daily as needed  . Syncope   . Urinary frequency    takes Flomax daily  . Weakness    numbness and tingling    Past Surgical History:  Procedure Laterality Date  . ABDOMINAL  HYSTERECTOMY  1988  . APPENDECTOMY  1988  . CESAREAN SECTION  1973/1977   x2  . CHOLECYSTECTOMY  1997  . COLECTOMY  1990  . COLONOSCOPY    . ESOPHAGOGASTRODUODENOSCOPY    . EYE SURGERY     bilateral - /w IOL  . FRACTURE SURGERY Right    rods and screws  . LUMBAR LAMINECTOMY/DECOMPRESSION MICRODISCECTOMY Left 01/24/2013   Procedure: LUMBAR FIVE TO SACRAL ONE LUMBAR LAMINECTOMY/DECOMPRESSION MICRODISCECTOMY 1 LEVEL;  Surgeon: Tia Alert, MD;  Location: [facility];  Service: Neurosurgery;  Laterality: Left;  Marland Kitchen MAXIMUM ACCESS (MAS)POSTERIOR LUMBAR INTERBODY FUSION (PLIF) 1 LEVEL N/A 06/18/2014   Procedure: MAXIMUM ACCESS SURGERY POSTERIOR LUMBAR INTERBODY FUSION LUMBAR FIVE TO SACRAL ONE ;  Surgeon: Tia Alert, MD;  Location: [facility];  Service: Neurosurgery;  Laterality: N/A;    There were no vitals filed for this visit.   Subjective Assessment - 03/09/20 1320    Subjective Patient reports that she did not complete her exercises, but was very active over the weekend. No falls.    Pertinent History MS, prior L knee fracture,  R toe fracture, R hip fracture    Limitations Standing;Walking    How long can you sit comfortably? n/a    How long can you stand comfortably? n/a    How long can you walk comfortably? n/a    Patient Stated Goals Improve stability going between different surfaces    Currently in Pain? No/denies               [facility] Adult PT Treatment/Exercise - 03/09/20 0001      Transfers   Transfers Sit to Stand;Stand to Sit    Sit to Stand 5: Supervision    Stand to Sit 5: Supervision      Ambulation/Gait   Ambulation/Gait Yes    Ambulation/Gait Assistance 5: Supervision    Ambulation/Gait Assistance Details Completed ambulation on outdoor unlevel surfaces including pavement and grass x 250 ft. CGA required on grass, one instance of imbalance noted on grass surface. Patient demo increased challenge ascending/descending ramp with changed gait pattern. PT  providing verbal cues for improved heel toe pattern.    Ambulation Distance (Feet) 250 Feet    Assistive device None    Gait Pattern Step-through pattern;Decreased stance time - left;Decreased step length - left;Decreased dorsiflexion - right;Decreased dorsiflexion - left;Left flexed knee in stance;Shuffle;Left foot flat;Right foot flat;Trunk flexed    Ambulation Surface Level;Indoor;Unlevel;Outdoor;Paved;Grass    Curb 5: Supervision    Curb Details (indicate cue type and reason) continued curb negoitation x 5 reps on outdoor curb. overall supervision with completion, no assistance required. PT educating to continue completing in daily activites with husband close by for safety, and if needed for balance      High Level Balance   High Level Balance Activities Head turns;Backward walking;Side stepping    High Level Balance Comments Completed horizontal head turns x 115 with close supervision, intermittent CGA, PT providing verbal cues for maintaining pace with completion of head turn. Completed ambulation across red/blue balance mat completed 4 x 20' of sidestepping, 4 x 20' of ambulation with horizontal/vertical head  turns. Then completed on firm surface completed backwards walking x 30'. increased challenge ntoed with backwards walking, decreased step length noted on LLE. PT providing manual faciliation for improved weight shift to rpomtoe step length.               Balance Exercises - 03/09/20 0001      Balance Exercises: Standing   Standing Eyes Opened Narrow base of support (BOS);Foam/compliant surface;Head turns;Limitations    Standing Eyes Opened Limitations stnading narrow BOS completed horizontal/vertical head turns 2 x 10 reps. increased challenge with horizontal > vertical    Standing Eyes Closed Wide (BOA);Narrow base of support (BOS);Foam/compliant surface;30 secs;Limitations;4 reps    Standing Eyes Closed Limitations completed 4 x 30 seconds with eyes closed, slowly progressed  feet together. patient demonstrating improved balance with narrow BOS.    Stepping Strategy Anterior;Posterior;Foam/compliant surface;UE support;Limitations    Stepping Strategy Limitations standing on airex without UE support completed ant/posterior stepping strategy x 15 reps each direction. CGA intermittently.               PT Short Term Goals - 02/17/20 1123      PT SHORT TERM GOAL #1   Title Pt will be independent with initial HEP    Time 3    Period Weeks    Status Achieved    Target Date 02/12/20      PT SHORT TERM GOAL #2   Title Pt will improve her 5x  STS to </=12 sec    Baseline 14.22 sec on eval; 11.21 sec on 02/17/20    Time 3    Period Weeks    Status Achieved    Target Date 02/12/20      PT SHORT TERM GOAL #3   Title Pt will be able to demo heel strike during gait without cueing    Baseline requires cueing    Time 3    Period Weeks    Status On-going    Target Date 02/12/20             PT Long Term Goals - 03/02/20 1106      PT LONG TERM GOAL #1   Title Pt will be independent with advanced HEP    Time 6    Period Weeks    Status New    Target Date 03/16/20      PT LONG TERM GOAL #2   Title Pt will improve her DGI score to at least 20/24 to demo decreased fall risk    Baseline 17/24 on 01/22/20    Time 6    Period Weeks    Status New      PT LONG TERM GOAL #3   Title Pt will improve gait speed by at least .4 ft/sec to demo minimal clinically important difference for community mobility    Baseline 1.95 ft/sec    Time 6    Period Weeks    Status New                 Plan - 03/09/20 1417    Clinical Impression Statement Continued balance activites, including negotiation/gait trianing on outdoor unlevel surfaces including pavement/grass. One instance of CGA required on grass surface. Continued high level balance actvities focused on complaint surfaces, patient tolerating well. Will continue to progress toward all LTGs.    Personal  Factors and Comorbidities Age;Comorbidity 1;Comorbidity 2    Comorbidities MS, history of multiple fractures (R hip, R toes, L knee)    Examination-Activity Limitations Locomotion Level;Squat;Stairs    Examination-Participation Restrictions Community Activity;Yard Work    Conservation officer, historic buildings Evolving/Moderate complexity    Rehab Potential Good    PT Frequency 1x / week    PT Duration 6 weeks    PT Treatment/Interventions Aquatic Therapy;Cryotherapy;Electrical Stimulation;Moist Heat;DME Instruction;Ultrasound;Gait training;Stair training;Functional mobility training;Therapeutic activities;Therapeutic exercise;Balance training;Neuromuscular re-education;Patient/family education;Manual techniques;Orthotic Fit/Training;Dry needling;Taping;Vestibular;Passive range of motion    PT Next Visit Plan Check goals + resubmit for visits. Complete balance outdoors if weather allows, including curb negotiation. Continue balance exercises. Work on ankle strengthening. Work on ankle strategies/stepping strategy. Rockerboard    Consulted and Agree with Plan of Care Patient           Patient will benefit from skilled therapeutic intervention in order to improve the following deficits and impairments:  Abnormal gait,Difficulty walking,Decreased coordination,Decreased balance,Decreased mobility,Decreased strength,Postural dysfunction  Visit Diagnosis: Muscle weakness (generalized)  Difficulty in walking, not elsewhere classified  Other abnormalities of gait and mobility     Problem List Patient Active Problem List   Diagnosis Date Noted  . Restless leg syndrome   . Chronic back pain   . Low back pain without sciatica 11/01/2018  . Essential hypertension   . SBO (small bowel obstruction) (HCC) 09/01/2016  . Fibromyalgia   . Abdominal pain, vomiting, and diarrhea   . Dehydration   . Somnolence, daytime 10/22/2015  . Fatigue 10/22/2015  . Chronic leg pain 06/29/2015  . S/P lumbar  spinal fusion 06/18/2014  .  Abnormality of gait 02/18/2013  . S/P lumbar microdiscectomy 01/24/2013  . Hypokalemia 12/24/2012  . Sweating abnormality 10/19/2012  . Dysautonomia orthostatic hypotension syndrome (HCC) 08/16/2012  . Hereditary and idiopathic peripheral neuropathy 08/16/2012  . Chronic adrenal insufficiency (HCC) 08/16/2012  . MS (multiple sclerosis) (HCC) 08/15/2012    Tempie Donning, PT, DPT 03/09/2020, 2:21 PM  [facility] [address] [place], [place], [address] Phone: [phone]   Fax:  [phone]  Name: Katrina Rivera MRN: [id number] Date of Birth: [birth year]

## 2020-03-16 ENCOUNTER — Ambulatory Visit: Payer: PPO | Attending: Neurology

## 2020-03-16 ENCOUNTER — Other Ambulatory Visit: Payer: Self-pay

## 2020-03-16 DIAGNOSIS — R262 Difficulty in walking, not elsewhere classified: Secondary | ICD-10-CM | POA: Insufficient documentation

## 2020-03-16 DIAGNOSIS — M6281 Muscle weakness (generalized): Secondary | ICD-10-CM | POA: Diagnosis not present

## 2020-03-16 DIAGNOSIS — R2689 Other abnormalities of gait and mobility: Secondary | ICD-10-CM | POA: Insufficient documentation

## 2020-03-16 NOTE — Patient Instructions (Signed)
Access Code: [email or web address] URL: [email or web address] Date: 03/16/2020 Prepared by: Jethro Bastos  Exercises Narrow Stance with Eyes Closed and Head Rotation on Foam Pad - 1 x daily - 5 x weekly - 2 sets - 10 reps Narrow Stance with Eyes Closed and Head Nods on Foam Pad - 1 x daily - 5 x weekly - 2 sets - 10 reps Tandem Stance with Support - 1 x daily - 5 x weekly - 1 sets - 3 reps - 30 hold Forward and Backward Step Over with Counter Support - 1 x daily - 5 x weekly - 3 sets - 10 reps Heel Toe Raises with Counter Support - 1 x daily - 5 x weekly - 3 sets - 10 reps Walking with Head Rotation - 1 x daily - 5 x weekly - 5 sets    HEP Tracker provided as well.

## 2020-03-16 NOTE — Therapy (Signed)
[facility] [address] [place], [place], [address] Phone: [phone]   Fax:  [phone]  Physical Therapy Treatment/Re-Certification  Patient Details  Name: Katrina Rivera MRN: [id number] Date of Birth: 04-Oct-1945 Referring Provider (PT): Suzzanne Cloud, NP   Encounter Date: 03/16/2020   PT End of Session - 03/16/20 1230    Visit Number 7    Number of Visits 13    Date for PT Re-Evaluation 05/15/20    Authorization Type [employer]    Progress Note Due on Visit 10    PT Start Time 1230    PT Stop Time 1315    PT Time Calculation (min) 45 min    Equipment Utilized During Treatment Gait belt    Activity Tolerance Patient tolerated treatment well    Behavior During Therapy [facility] for tasks assessed/performed           Past Medical History:  Diagnosis Date  . Addison's disease ([place])    takes Solu Cortef daily  . Anemia    takes Ferrous Sulfate daily  . Anxiety    takes Xanax nightly  . Arthritis   . Chronic back pain    stenosis  . Depression    takes Cymbalta daily  . Dizziness    if b/p drops   . Fibromyalgia   . Fibromyalgia   . GERD (gastroesophageal reflux disease)   . Headache(784.0)   . History of blood transfusion    no abnormal reaction noted  . History of bronchitis    many yrs ago   . Hypokalemia    takes Potassium daily  . Hypotension   . Hypotension    takes Florinef daily  . IBS (irritable bowel syndrome)    takes Electronics engineer daily  . Insomnia    takes Trazodone nightly  . Joint pain   . Multiple sclerosis ([place])    doesn't take any meds  . Multiple sclerosis ([place])   . Nocturia   . Osteoporosis   . Peripheral neuropathy   . Restless leg syndrome   . Seasonal allergies    takes Zyrtec daily;uses Flonase daily as needed  . Syncope   . Urinary frequency    takes Flomax daily  . Weakness    numbness and tingling    Past Surgical History:  Procedure Laterality Date   . ABDOMINAL HYSTERECTOMY  1988  . APPENDECTOMY  1988  . CESAREAN SECTION  1973/1977   x2  . CHOLECYSTECTOMY  1997  . COLECTOMY  1990  . COLONOSCOPY    . ESOPHAGOGASTRODUODENOSCOPY    . EYE SURGERY     bilateral - /w IOL  . FRACTURE SURGERY Right    rods and screws  . LUMBAR LAMINECTOMY/DECOMPRESSION MICRODISCECTOMY Left 01/24/2013   Procedure: LUMBAR FIVE TO SACRAL ONE LUMBAR LAMINECTOMY/DECOMPRESSION MICRODISCECTOMY 1 LEVEL;  Surgeon: Eustace Moore, MD;  Location: [facility];  Service: Neurosurgery;  Laterality: Left;  Marland Kitchen MAXIMUM ACCESS (MAS)POSTERIOR LUMBAR INTERBODY FUSION (PLIF) 1 LEVEL N/A 06/18/2014   Procedure: MAXIMUM ACCESS SURGERY POSTERIOR LUMBAR INTERBODY FUSION LUMBAR FIVE TO SACRAL ONE ;  Surgeon: Eustace Moore, MD;  Location: [facility];  Service: Neurosurgery;  Laterality: N/A;    There were no vitals filed for this visit.   Subjective Assessment - 03/16/20 1232    Subjective No new changes/complaints. Patient reports that she turned her stimulator down.  No falls to report. Continues to walk but reports challenge with incline.    Pertinent  History MS, prior L knee fracture, R toe fracture, R hip fracture    Limitations Standing;Walking    How long can you sit comfortably? n/a    How long can you stand comfortably? n/a    How long can you walk comfortably? n/a    Patient Stated Goals Improve stability going between different surfaces    Currently in Pain? Yes    Pain Score 7     Pain Location Back    Pain Orientation Mid;Lower    Pain Descriptors / Indicators Aching    Pain Type Chronic pain              [facility] PT Assessment - 03/16/20 0001      Assessment   Medical Diagnosis G35 (ICD-10-CM) - MS (multiple sclerosis) ([place])  R26.9 (ICD-10-CM) - Abnormality of gait    Referring Provider (PT) Suzzanne Cloud, NP               [facility] Adult PT Treatment/Exercise - 03/16/20 0001      Transfers   Transfers Sit to Stand;Stand to Sit    Sit to Stand 5:  Supervision    Stand to Sit 5: Supervision      Ambulation/Gait   Ambulation/Gait Yes    Ambulation/Gait Assistance 5: Supervision    Ambulation/Gait Assistance Details Completed ambulation throughout therapy session. paitent demonstrating improved heel toe pattern with ambulation today.    Assistive device None    Gait Pattern Step-through pattern;Decreased stance time - left;Decreased step length - left;Decreased dorsiflexion - right;Decreased dorsiflexion - left;Left flexed knee in stance;Shuffle;Left foot flat;Right foot flat;Trunk flexed    Ambulation Surface Level;Indoor    Gait velocity 13.44 secs = 2.44 ft/sec    Ramp 5: Supervision    Ramp Details (indicate cue type and reason) completed ambulation up/down curb, increased challenge noted with descent as patient often takes decreased step length and more cautious.      Standardized Balance Assessment   Standardized Balance Assessment Dynamic Gait Index      Dynamic Gait Index   Level Surface Mild Impairment    Change in Gait Speed Mild Impairment    Gait with Horizontal Head Turns Mild Impairment    Gait with Vertical Head Turns Normal    Gait and Pivot Turn Normal    Step Over Obstacle Mild Impairment    Step Around Obstacles Normal    Steps Mild Impairment    Total Score 19      Neuro Re-ed    Neuro Re-ed Details  Completed standing on incline with EC 1 x 30 seconds, then on decline with EC 1 x 30 seconds, increased challenge with decline and increased sway noted. Completed entire review of current balance HEP and updated as tolerated. Added addition of heel/toe raises x 10 reps to HEP. Patient reporting decreased compliance with HEP, PT educating on compliance methods including breaking exercises into sections and alternating days. PT also provided HEP tracker to promote compliance.          Reviewed the following HEP:   Access Code: [email or web address] URL: [email or web address] Date: 03/16/2020 Prepared by:  Baldomero Lamy  Exercises Narrow Stance with Eyes Closed and Head Rotation on Foam Pad - 1 x daily - 5 x weekly - 2 sets - 10 reps Narrow Stance with Eyes Closed and Head Nods on Foam Pad - 1 x daily - 5 x weekly - 2 sets - 10 reps Tandem Stance with Support - 1 x daily - 5  x weekly - 1 sets - 3 reps - 30 hold Forward and Backward Step Over with Counter Support - 1 x daily - 5 x weekly - 3 sets - 10 reps Heel Toe Raises with Counter Support - 1 x daily - 5 x weekly - 3 sets - 10 reps Walking with Head Rotation - 1 x daily - 5 x weekly - 5 sets         PT Education - 03/16/20 1330    Education Details progress toward LTG; HEP Update and Tracker    Person(s) Educated Patient    Methods Explanation;Demonstration;Handout    Comprehension Verbalized understanding;Returned demonstration            PT Short Term Goals - 02/17/20 1123      PT SHORT TERM GOAL #1   Title Pt will be independent with initial HEP    Time 3    Period Weeks    Status Achieved    Target Date 02/12/20      PT SHORT TERM GOAL #2   Title Pt will improve her 5x STS to </=12 sec    Baseline 14.22 sec on eval; 11.21 sec on 02/17/20    Time 3    Period Weeks    Status Achieved    Target Date 02/12/20      PT SHORT TERM GOAL #3   Title Pt will be able to demo heel strike during gait without cueing    Baseline requires cueing    Time 3    Period Weeks    Status On-going    Target Date 02/12/20             PT Long Term Goals - 03/16/20 1242      PT LONG TERM GOAL #1   Title Pt will be independent with advanced HEP    Baseline reports independence with exercises, decreased compliance    Time 6    Period Weeks    Status Partially Met      PT LONG TERM GOAL #2   Title Pt will improve her DGI score to at least 20/24 to demo decreased fall risk    Baseline 17/24 on 01/22/20; 19/24 on 03/16/20    Time 6    Period Weeks    Status New      PT LONG TERM GOAL #3   Title Pt will improve gait speed by  at least .4 ft/sec to demo minimal clinically important difference for community mobility    Baseline 1.95 ft/sec; 2.44 ft/sec    Time 6    Period Weeks    Status Achieved           Updated Short Term Goals:   PT Short Term Goals - 03/16/20 1331      PT SHORT TERM GOAL #1   Title Patient will report improved compliance with HEP completing >/= 3x/week    Baseline currently 1x/week    Time 3    Period Weeks    Status New    Target Date 04/06/20            Updated Long Term Goals:     PT Long Term Goals - 03/16/20 1333      PT LONG TERM GOAL #1   Title Pt will be demonstrate independence with advanced balance HEP and improved compliance (>/= 3x/week). (ALL LTGs Due: 04/27/20)    Baseline reports independence with exercises, decreased compliance    Time 6    Period  Weeks    Status Revised    Target Date 04/27/20      PT LONG TERM GOAL #2   Title Pt will improve her DGI score to at least 20/24 to demo decreased fall risk    Baseline 17/24 on 01/22/20; 19/24 on 03/16/20    Time 6    Period Weeks    Status On-going      PT LONG TERM GOAL #3   Title Pt will improve gait speed to at least 2.75 ft/second to demo improved community mobility    Baseline 1.95 ft/sec; 2.44 ft/sec    Time 6    Period Weeks    Status Revised      PT LONG TERM GOAL #4   Title Patient will demosntrate ability to acsend/descend ramp with no LOB and supervision    Baseline increased sway with descent, CGA    Time 6    Period Weeks    Status New      PT LONG TERM GOAL #5   Title Patient will demonstrate ability to complete visual scanning x 100 ft with no imbalance noted    Baseline increased sway with head turns/scanning    Time 6    Period Weeks    Status New               Plan - 03/16/20 1325    Clinical Impression Statement Today's skilled PT session focused on assesment of patient's progress toward LTG. Patient able to meet LTG #3, demonstrating improved gait speed to 2.44  ft/sec. Patient also dmeosntrating improved balance with 19/24 on DGI, however just shy of meeting LTG associated. Patient reporting independence with HEP but decreased compliance. rest of session spent reveiwing current HEP and educating on improved compliance methods to promote further improvements with PT services. Patient is demonstrating slow steady progress with PT services and will benefit from continued skilled PT services to further reduce fall risk and maximize functional mobility.    Personal Factors and Comorbidities Age;Comorbidity 1;Comorbidity 2    Comorbidities MS, history of multiple fractures (R hip, R toes, L knee)    Examination-Activity Limitations Locomotion Level;Squat;Stairs    Examination-Participation Restrictions Community Activity;Yard Work    Merchant navy officer Evolving/Moderate complexity    Rehab Potential Good    PT Frequency 1x / week    PT Duration 6 weeks    PT Treatment/Interventions Aquatic Therapy;Cryotherapy;Electrical Stimulation;Moist Heat;DME Instruction;Ultrasound;Gait training;Stair training;Functional mobility training;Therapeutic activities;Therapeutic exercise;Balance training;Neuromuscular re-education;Patient/family education;Manual techniques;Orthotic Fit/Training;Dry needling;Taping;Vestibular;Passive range of motion    PT Next Visit Plan Continue balance on incline. Walking with Head turns. Heel Toe Pattern with Gait. Continue balance exercises. Work on ankle strengthening. Work on ankle strategies/stepping strategy. Rockerboard    Consulted and Agree with Plan of Care Patient           Patient will benefit from skilled therapeutic intervention in order to improve the following deficits and impairments:  Abnormal gait,Difficulty walking,Decreased coordination,Decreased balance,Decreased mobility,Decreased strength,Postural dysfunction  Visit Diagnosis: Muscle weakness (generalized)  Difficulty in walking, not elsewhere  classified  Other abnormalities of gait and mobility     Problem List Patient Active Problem List   Diagnosis Date Noted  . Restless leg syndrome   . Chronic back pain   . Low back pain without sciatica 11/01/2018  . Essential hypertension   . SBO (small bowel obstruction) ([place]) 09/01/2016  . Fibromyalgia   . Abdominal pain, vomiting, and diarrhea   . Dehydration   . Somnolence, daytime 10/22/2015  .  Fatigue 10/22/2015  . Chronic leg pain 06/29/2015  . S/P lumbar spinal fusion 06/18/2014  . Abnormality of gait 02/18/2013  . S/P lumbar microdiscectomy 01/24/2013  . Hypokalemia 12/24/2012  . Sweating abnormality 10/19/2012  . Dysautonomia orthostatic hypotension syndrome ([place]) 08/16/2012  . Hereditary and idiopathic peripheral neuropathy 08/16/2012  . Chronic adrenal insufficiency ([place]) 08/16/2012  . MS (multiple sclerosis) ([place]) 08/15/2012    Jones Bales, PT, DPT 03/16/2020, 1:30 PM  [facility] [address] [place], [place], [address] Phone: [phone]   Fax:  [phone]  Name: ARWYN BESAW MRN: [id number] Date of Birth: 09/30/1945

## 2020-03-23 ENCOUNTER — Other Ambulatory Visit: Payer: Self-pay

## 2020-03-23 ENCOUNTER — Ambulatory Visit: Payer: PPO

## 2020-03-23 DIAGNOSIS — R262 Difficulty in walking, not elsewhere classified: Secondary | ICD-10-CM

## 2020-03-23 DIAGNOSIS — M6281 Muscle weakness (generalized): Secondary | ICD-10-CM

## 2020-03-23 DIAGNOSIS — R2689 Other abnormalities of gait and mobility: Secondary | ICD-10-CM

## 2020-03-23 NOTE — Therapy (Signed)
[facility] [address] [place], [place], [address] Phone: [phone]   Fax:  [phone]  Physical Therapy Treatment  Patient Details  Name: Katrina Rivera MRN: [id number] Date of Birth: 1945-03-09 Referring Provider (PT): Glean Salvo, NP   Encounter Date: 03/23/2020   PT End of Session - 03/23/20 1408    Visit Number 8    Number of Visits 13    Date for PT Re-Evaluation 05/15/20    Authorization Type [employer]    Progress Note Due on Visit 10    PT Start Time 1400    PT Stop Time 1445    PT Time Calculation (min) 45 min    Equipment Utilized During Treatment Gait belt    Activity Tolerance Patient tolerated treatment well    Behavior During Therapy [facility] for tasks assessed/performed           Past Medical History:  Diagnosis Date  . Addison's disease ([facility])    takes Solu Cortef daily  . Anemia    takes Ferrous Sulfate daily  . Anxiety    takes Xanax nightly  . Arthritis   . Chronic back pain    stenosis  . Depression    takes Cymbalta daily  . Dizziness    if b/p drops   . Fibromyalgia   . Fibromyalgia   . GERD (gastroesophageal reflux disease)   . Headache(784.0)   . History of blood transfusion    no abnormal reaction noted  . History of bronchitis    many yrs ago   . Hypokalemia    takes Potassium daily  . Hypotension   . Hypotension    takes Florinef daily  . IBS (irritable bowel syndrome)    takes Librarian, academic daily  . Insomnia    takes Trazodone nightly  . Joint pain   . Multiple sclerosis ([facility])    doesn't take any meds  . Multiple sclerosis ([facility])   . Nocturia   . Osteoporosis   . Peripheral neuropathy   . Restless leg syndrome   . Seasonal allergies    takes Zyrtec daily;uses Flonase daily as needed  . Syncope   . Urinary frequency    takes Flomax daily  . Weakness    numbness and tingling    Past Surgical History:  Procedure Laterality Date  . ABDOMINAL  HYSTERECTOMY  1988  . APPENDECTOMY  1988  . CESAREAN SECTION  1973/1977   x2  . CHOLECYSTECTOMY  1997  . COLECTOMY  1990  . COLONOSCOPY    . ESOPHAGOGASTRODUODENOSCOPY    . EYE SURGERY     bilateral - /w IOL  . FRACTURE SURGERY Right    rods and screws  . LUMBAR LAMINECTOMY/DECOMPRESSION MICRODISCECTOMY Left 01/24/2013   Procedure: LUMBAR FIVE TO SACRAL ONE LUMBAR LAMINECTOMY/DECOMPRESSION MICRODISCECTOMY 1 LEVEL;  Surgeon: Tia Alert, MD;  Location: [facility];  Service: Neurosurgery;  Laterality: Left;  Marland Kitchen MAXIMUM ACCESS (MAS)POSTERIOR LUMBAR INTERBODY FUSION (PLIF) 1 LEVEL N/A 06/18/2014   Procedure: MAXIMUM ACCESS SURGERY POSTERIOR LUMBAR INTERBODY FUSION LUMBAR FIVE TO SACRAL ONE ;  Surgeon: Tia Alert, MD;  Location: [facility];  Service: Neurosurgery;  Laterality: N/A;    There were no vitals filed for this visit.   Subjective Assessment - 03/23/20 1402    Subjective Patient reports that the knee cap has been bothering her on L side. No other new changes. Reports that splitting up exercises went well.    Pertinent  History MS, prior L knee fracture, R toe fracture, R hip fracture    Limitations Standing;Walking    How long can you sit comfortably? n/a    How long can you stand comfortably? n/a    How long can you walk comfortably? n/a    Patient Stated Goals Improve stability going between different surfaces    Currently in Pain? Yes    Pain Score 6     Pain Location Knee    Pain Orientation Left    Pain Descriptors / Indicators Aching    Pain Type Chronic pain    Pain Onset In the past 7 days    Aggravating Factors  walking              [facility] Adult PT Treatment/Exercise - 03/23/20 0001      Transfers   Transfers Sit to Stand;Stand to Sit    Sit to Stand 5: Supervision    Stand to Sit 5: Supervision      Ambulation/Gait   Ambulation/Gait Yes    Ambulation/Gait Assistance 5: Supervision    Ambulation/Gait Assistance Details PT educated on foot up  brace, with patient interested. PT Donned foot up brace to RLE and completed ambulation x 115 ft, improved gait pattern and heel toe pattern noted with donned. Pt reporting she could see the benefit of brace. Due to DF weakness and decreased toe clearance bilaterally, applied foot up brace to LLE as well. Completed ambulation x 300 ft with B braces donned and patient demonstrating significant improvements in gait pattern. Patient reporting feeling more comfortable with ambulation with braces donned as she does not have. PT educating on purchase options. Handout provided on information and sizing.    Ambulation Distance (Feet) 115 Feet   x 1, 300 x 1   Assistive device None    Gait Pattern Step-through pattern;Decreased stance time - left;Decreased step length - left;Decreased dorsiflexion - right;Decreased dorsiflexion - left;Left flexed knee in stance;Shuffle;Left foot flat;Right foot flat;Trunk flexed    Ambulation Surface Level;Indoor    Ramp 5: Supervision    Ramp Details (indicate cue type and reason) completed ambulation up/down curb x 4 reps with toe up braces donned bilaterally, patient reports increased comfort with descending ramp with braces due to improved heel toe pattern.      Neuro Re-ed    Neuro Re-ed Details  Standing on incline completed staggered stance with horizontal/vertical head turns x 10 reps each direction. Close supervision and intermittent CGA required for balance. Completed recirpocal stepping with agility ladder working toward improved weight shift and heel toe pattern, completed x 4 laps down and back. at patient's rquest to review, at countertop completed stepping over/back yoga block x 8 reps with PT providing verbal cues for proper completion to promote improved ability to complete with HEP                  PT Education - 03/23/20 1506    Education Details educated on foot up brace purchase options (handout provided)    Person(s) Educated Patient    Methods  Explanation;Handout    Comprehension Verbalized understanding            PT Short Term Goals - 03/16/20 1331      PT SHORT TERM GOAL #1   Title Patient will report improved compliance with HEP completing >/= 3x/week    Baseline currently 1x/week    Time 3    Period Weeks    Status New  Target Date 04/06/20             PT Long Term Goals - 03/16/20 1333      PT LONG TERM GOAL #1   Title Pt will be demonstrate independence with advanced balance HEP and improved compliance (>/= 3x/week). (ALL LTGs Due: 04/27/20)    Baseline reports independence with exercises, decreased compliance    Time 6    Period Weeks    Status Revised    Target Date 04/27/20      PT LONG TERM GOAL #2   Title Pt will improve her DGI score to at least 20/24 to demo decreased fall risk    Baseline 17/24 on 01/22/20; 19/24 on 03/16/20    Time 6    Period Weeks    Status On-going      PT LONG TERM GOAL #3   Title Pt will improve gait speed to at least 2.75 ft/second to demo improved community mobility    Baseline 1.95 ft/sec; 2.44 ft/sec    Time 6    Period Weeks    Status Revised      PT LONG TERM GOAL #4   Title Patient will demosntrate ability to acsend/descend ramp with no LOB and supervision    Baseline increased sway with descent, CGA    Time 6    Period Weeks    Status New      PT LONG TERM GOAL #5   Title Patient will demonstrate ability to complete visual scanning x 100 ft with no imbalance noted    Baseline increased sway with head turns/scanning    Time 6    Period Weeks    Status New                 Plan - 03/23/20 1506    Clinical Impression Statement Completed extensive gait training with B foot up braces donned, patient demo improved gait pattern and feeling more comfortable and ocnfident in ambulation with improved heel toe pattern. PT educating on purchase options. Continued NMR focused on improved step length and balance activites. Will continue to progress toward  all LTGs.    Personal Factors and Comorbidities Age;Comorbidity 1;Comorbidity 2    Comorbidities MS, history of multiple fractures (R hip, R toes, L knee)    Examination-Activity Limitations Locomotion Level;Squat;Stairs    Examination-Participation Restrictions Community Activity;Yard Work    Conservation officer, historic buildings Evolving/Moderate complexity    Rehab Potential Good    PT Frequency 1x / week    PT Duration 6 weeks    PT Treatment/Interventions Aquatic Therapy;Cryotherapy;Electrical Stimulation;Moist Heat;DME Instruction;Ultrasound;Gait training;Stair training;Functional mobility training;Therapeutic activities;Therapeutic exercise;Balance training;Neuromuscular re-education;Patient/family education;Manual techniques;Orthotic Fit/Training;Dry needling;Taping;Vestibular;Passive range of motion    PT Next Visit Plan Continue balance on incline. Walking with Head turns. Heel Toe Pattern with Gait. Continue balance exercises. Work on ankle strengthening. Work on ankle strategies/stepping strategy. Rockerboard    Consulted and Agree with Plan of Care Patient           Patient will benefit from skilled therapeutic intervention in order to improve the following deficits and impairments:  Abnormal gait,Difficulty walking,Decreased coordination,Decreased balance,Decreased mobility,Decreased strength,Postural dysfunction  Visit Diagnosis: Muscle weakness (generalized)  Difficulty in walking, not elsewhere classified  Other abnormalities of gait and mobility     Problem List Patient Active Problem List   Diagnosis Date Noted  . Restless leg syndrome   . Chronic back pain   . Low back pain without sciatica 11/01/2018  . Essential hypertension   .  SBO (small bowel obstruction) ([facility]) 09/01/2016  . Fibromyalgia   . Abdominal pain, vomiting, and diarrhea   . Dehydration   . Somnolence, daytime 10/22/2015  . Fatigue 10/22/2015  . Chronic leg pain 06/29/2015  . S/P lumbar spinal  fusion 06/18/2014  . Abnormality of gait 02/18/2013  . S/P lumbar microdiscectomy 01/24/2013  . Hypokalemia 12/24/2012  . Sweating abnormality 10/19/2012  . Dysautonomia orthostatic hypotension syndrome ([facility]) 08/16/2012  . Hereditary and idiopathic peripheral neuropathy 08/16/2012  . Chronic adrenal insufficiency ([facility]) 08/16/2012  . MS (multiple sclerosis) ([facility]) 08/15/2012    Tempie Donning, PT, DPT 03/23/2020, 3:08 PM   [facility] [address] [place], [place], [address] Phone: [phone]   Fax:  [phone]  Name: Katrina Rivera MRN: [id number] Date of Birth: 1945/08/02

## 2020-03-25 DIAGNOSIS — M5136 Other intervertebral disc degeneration, lumbar region: Secondary | ICD-10-CM | POA: Diagnosis not present

## 2020-03-25 DIAGNOSIS — M1712 Unilateral primary osteoarthritis, left knee: Secondary | ICD-10-CM | POA: Diagnosis not present

## 2020-03-25 DIAGNOSIS — M160 Bilateral primary osteoarthritis of hip: Secondary | ICD-10-CM | POA: Diagnosis not present

## 2020-03-26 ENCOUNTER — Telehealth: Payer: Self-pay | Admitting: Endocrinology

## 2020-03-26 NOTE — Telephone Encounter (Signed)
Patient requests to be called at ph# [phone] re: Patient states one her Providers wants to prescribe Patient  Celebrex, however, Provider requested Patient contact Dr. Lucianne Muss to make sure that Celebrex does not interfere with Patient's other medications.

## 2020-03-26 NOTE — Telephone Encounter (Signed)
She can take Celebrex without any interaction with her steroid medication

## 2020-03-27 NOTE — Telephone Encounter (Signed)
Noted,Patient is aware

## 2020-03-30 ENCOUNTER — Other Ambulatory Visit: Payer: Self-pay

## 2020-03-30 ENCOUNTER — Ambulatory Visit: Payer: PPO

## 2020-03-30 DIAGNOSIS — M6281 Muscle weakness (generalized): Secondary | ICD-10-CM

## 2020-03-30 DIAGNOSIS — M1612 Unilateral primary osteoarthritis, left hip: Secondary | ICD-10-CM | POA: Diagnosis not present

## 2020-03-30 DIAGNOSIS — R262 Difficulty in walking, not elsewhere classified: Secondary | ICD-10-CM

## 2020-03-30 DIAGNOSIS — R2689 Other abnormalities of gait and mobility: Secondary | ICD-10-CM

## 2020-03-30 NOTE — Therapy (Signed)
[facility] [address] [place], [place], [address] Phone: [phone]   Fax:  [phone]  Physical Therapy Treatment  Patient Details  Name: Katrina Rivera MRN: [id number] Date of Birth: June 04, 1945 Referring Provider (PT): Glean Salvo, NP   Encounter Date: 03/30/2020   PT End of Session - 03/30/20 1111    Visit Number 9    Number of Visits 13    Date for PT Re-Evaluation 05/15/20    Authorization Type [employer]    Progress Note Due on Visit 10    PT Start Time 1102    PT Stop Time 1145    PT Time Calculation (min) 43 min    Equipment Utilized During Treatment Gait belt    Activity Tolerance Patient tolerated treatment well    Behavior During Therapy [facility] for tasks assessed/performed           Past Medical History:  Diagnosis Date  . Addison's disease (HCC)    takes Solu Cortef daily  . Anemia    takes Ferrous Sulfate daily  . Anxiety    takes Xanax nightly  . Arthritis   . Chronic back pain    stenosis  . Depression    takes Cymbalta daily  . Dizziness    if b/p drops   . Fibromyalgia   . Fibromyalgia   . GERD (gastroesophageal reflux disease)   . Headache(784.0)   . History of blood transfusion    no abnormal reaction noted  . History of bronchitis    many yrs ago   . Hypokalemia    takes Potassium daily  . Hypotension   . Hypotension    takes Florinef daily  . IBS (irritable bowel syndrome)    takes Librarian, academic daily  . Insomnia    takes Trazodone nightly  . Joint pain   . Multiple sclerosis (HCC)    doesn't take any meds  . Multiple sclerosis (HCC)   . Nocturia   . Osteoporosis   . Peripheral neuropathy   . Restless leg syndrome   . Seasonal allergies    takes Zyrtec daily;uses Flonase daily as needed  . Syncope   . Urinary frequency    takes Flomax daily  . Weakness    numbness and tingling    Past Surgical History:  Procedure Laterality Date  . ABDOMINAL  HYSTERECTOMY  1988  . APPENDECTOMY  1988  . CESAREAN SECTION  1973/1977   x2  . CHOLECYSTECTOMY  1997  . COLECTOMY  1990  . COLONOSCOPY    . ESOPHAGOGASTRODUODENOSCOPY    . Rivera SURGERY     bilateral - /w IOL  . FRACTURE SURGERY Right    rods and screws  . LUMBAR LAMINECTOMY/DECOMPRESSION MICRODISCECTOMY Left 01/24/2013   Procedure: LUMBAR FIVE TO SACRAL ONE LUMBAR LAMINECTOMY/DECOMPRESSION MICRODISCECTOMY 1 LEVEL;  Surgeon: Tia Alert, MD;  Location: [facility];  Service: Neurosurgery;  Laterality: Left;  Marland Kitchen MAXIMUM ACCESS (MAS)POSTERIOR LUMBAR INTERBODY FUSION (PLIF) 1 LEVEL N/A 06/18/2014   Procedure: MAXIMUM ACCESS SURGERY POSTERIOR LUMBAR INTERBODY FUSION LUMBAR FIVE TO SACRAL ONE ;  Surgeon: Tia Alert, MD;  Location: [facility];  Service: Neurosurgery;  Laterality: N/A;    There were no vitals filed for this visit.   Subjective Assessment - 03/30/20 1108    Subjective Patient reports that got injections in L Knee and has been having burning sensations since then.    Pertinent History MS, prior L knee fracture, R toe  fracture, R hip fracture    Limitations Standing;Walking    How long can you sit comfortably? n/a    How long can you stand comfortably? n/a    How long can you walk comfortably? n/a    Patient Stated Goals Improve stability going between different surfaces    Currently in Pain? Yes    Pain Score 5     Pain Location Knee    Pain Orientation Left    Pain Descriptors / Indicators Aching;Burning    Pain Type Acute pain               [facility] Adult PT Treatment/Exercise - 03/30/20 0001      Ambulation/Gait   Ambulation/Gait Yes    Ambulation/Gait Assistance 5: Supervision    Ambulation/Gait Assistance Details Completed ambulation x 230 ft with B Foot Up braces donnbed. Followed by completing ambulation with trekking poles to promote improved arm swing 2 x 60'. PT providing verbal cues for sequencing initially. Improved arm swing noted demosntating  carryover at end of session.    Ambulation Distance (Feet) 230 Feet   x1, 2 x 60'   Assistive device None    Gait Pattern Step-through pattern;Decreased stance time - left;Decreased step length - left;Decreased dorsiflexion - right;Decreased dorsiflexion - left;Left flexed knee in stance;Shuffle;Left foot flat;Right foot flat;Trunk flexed    Ambulation Surface Level;Indoor    Stairs Yes    Stairs Assistance 4: Min guard   completed with B Foot Up Braces Donned   Stair Management Technique One rail Right;Alternating pattern;Forwards    Number of Stairs 8    Height of Stairs 6      High Level Balance   High Level Balance Activities Head turns    High Level Balance Comments Completed ambulation with horizontal head turns 3 x 50', PT providing tactile cues to isolate neck roation vs trunk rotation with completion. then completed vertical head turns x 50'. Intermittent slowing of gait speed requiring verbal cues from PT to maintain pace.      Neuro Re-ed    Neuro Re-ed Details  At countertop: completed lateral weights shifts R/L x 10 reps without UE support. Progressed to staggered stance completed diagonal weight shifts through hips x 10 reps, alteranting feet with sets.      Exercises   Exercises Other Exercises    Other Exercises  Completed seated toe raises with yellow theraband as resistance, completed 2 x 10 reps bilaterally.               Balance Exercises - 03/30/20 0001      Balance Exercises: Standing   SLS with Vectors Solid surface;Intermittent upper extremity assist;Limitations    SLS with Vectors Limitations standing at stairs: completed alternating toe taps x 10 reps without UE support. intermittent CGA. progressed to completing x 10 reps to second stair without UE support. Increased balance challenge noted to second step. Intermittent UE support from rails needed.             PT Education - 03/30/20 1155    Education Details educated will send message to MD  regarding foot up braces per patient request    Person(s) Educated Patient    Methods Explanation    Comprehension Verbalized understanding            PT Short Term Goals - 03/16/20 1331      PT SHORT TERM GOAL #1   Title Patient will report improved compliance with HEP completing >/= 3x/week  Baseline currently 1x/week    Time 3    Period Weeks    Status New    Target Date 04/06/20             PT Long Term Goals - 03/16/20 1333      PT LONG TERM GOAL #1   Title Pt will be demonstrate independence with advanced balance HEP and improved compliance (>/= 3x/week). (ALL LTGs Due: 04/27/20)    Baseline reports independence with exercises, decreased compliance    Time 6    Period Weeks    Status Revised    Target Date 04/27/20      PT LONG TERM GOAL #2   Title Pt will improve her DGI score to at least 20/24 to demo decreased fall risk    Baseline 17/24 on 01/22/20; 19/24 on 03/16/20    Time 6    Period Weeks    Status On-going      PT LONG TERM GOAL #3   Title Pt will improve gait speed to at least 2.75 ft/second to demo improved community mobility    Baseline 1.95 ft/sec; 2.44 ft/sec    Time 6    Period Weeks    Status Revised      PT LONG TERM GOAL #4   Title Patient will demosntrate ability to acsend/descend ramp with no LOB and supervision    Baseline increased sway with descent, CGA    Time 6    Period Weeks    Status New      PT LONG TERM GOAL #5   Title Patient will demonstrate ability to complete visual scanning x 100 ft with no imbalance noted    Baseline increased sway with head turns/scanning    Time 6    Period Weeks    Status New                 Plan - 03/30/20 1156    Clinical Impression Statement Continued gait training with B Foot Up Braces donned, and working toward improved scanning environment and arm swing to promote more natural gait pattern. Continued activites focused on improved balance with weight shifting and ankle strategie  activities. Patient require intermittent rest breaks due to mild L knee pain due to injections. Patient will continue to benefit from skilled PT services to progress toward all LTGs.    Personal Factors and Comorbidities Age;Comorbidity 1;Comorbidity 2    Comorbidities MS, history of multiple fractures (R hip, R toes, L knee)    Examination-Activity Limitations Locomotion Level;Squat;Stairs    Examination-Participation Restrictions Community Activity;Yard Work    Conservation officer, historic buildings Evolving/Moderate complexity    Rehab Potential Good    PT Frequency 1x / week    PT Duration 6 weeks    PT Treatment/Interventions Aquatic Therapy;Cryotherapy;Electrical Stimulation;Moist Heat;DME Instruction;Ultrasound;Gait training;Stair training;Functional mobility training;Therapeutic activities;Therapeutic exercise;Balance training;Neuromuscular re-education;Patient/family education;Manual techniques;Orthotic Fit/Training;Dry needling;Taping;Vestibular;Passive range of motion    PT Next Visit Plan Continue balance on incline. Walking with Head turns. Heel Toe Pattern with Gait. Continue balance exercises. Work on ankle strengthening. Work on ankle strategies/stepping strategy. Rockerboard    Consulted and Agree with Plan of Care Patient           Patient will benefit from skilled therapeutic intervention in order to improve the following deficits and impairments:  Abnormal gait,Difficulty walking,Decreased coordination,Decreased balance,Decreased mobility,Decreased strength,Postural dysfunction  Visit Diagnosis: Muscle weakness (generalized)  Difficulty in walking, not elsewhere classified  Other abnormalities of gait and mobility     Problem  List Patient Active Problem List   Diagnosis Date Noted  . Restless leg syndrome   . Chronic back pain   . Low back pain without sciatica 11/01/2018  . Essential hypertension   . SBO (small bowel obstruction) (HCC) 09/01/2016  . Fibromyalgia    . Abdominal pain, vomiting, and diarrhea   . Dehydration   . Somnolence, daytime 10/22/2015  . Fatigue 10/22/2015  . Chronic leg pain 06/29/2015  . S/P lumbar spinal fusion 06/18/2014  . Abnormality of gait 02/18/2013  . S/P lumbar microdiscectomy 01/24/2013  . Hypokalemia 12/24/2012  . Sweating abnormality 10/19/2012  . Dysautonomia orthostatic hypotension syndrome (HCC) 08/16/2012  . Hereditary and idiopathic peripheral neuropathy 08/16/2012  . Chronic adrenal insufficiency (HCC) 08/16/2012  . MS (multiple sclerosis) (HCC) 08/15/2012    Tempie Donning, PT, DPT 03/30/2020, 12:01 PM  [facility] [address] [place], [place], [address] Phone: [phone]   Fax:  [phone]  Name: Katrina Rivera MRN: [id number] Date of Birth: Sep 10, 1945

## 2020-03-31 ENCOUNTER — Other Ambulatory Visit: Payer: Self-pay | Admitting: Neurology

## 2020-03-31 DIAGNOSIS — G35 Multiple sclerosis: Secondary | ICD-10-CM

## 2020-03-31 DIAGNOSIS — R269 Unspecified abnormalities of gait and mobility: Secondary | ICD-10-CM

## 2020-03-31 NOTE — Progress Notes (Signed)
Faxed and confirmation received.

## 2020-03-31 NOTE — Progress Notes (Signed)
Physical Therapy reached out requesting Bilateral [place] Foot Up Drop Foot Brace. I placed the order. Will you please fax.   [facility] [address] [place], [place], [address] Phone: [phone]   Fax:  [phone]  Attention: Jethro Bastos, PT

## 2020-04-06 ENCOUNTER — Other Ambulatory Visit: Payer: Self-pay

## 2020-04-06 ENCOUNTER — Ambulatory Visit: Payer: PPO

## 2020-04-06 DIAGNOSIS — M6281 Muscle weakness (generalized): Secondary | ICD-10-CM | POA: Diagnosis not present

## 2020-04-06 DIAGNOSIS — R2689 Other abnormalities of gait and mobility: Secondary | ICD-10-CM

## 2020-04-06 DIAGNOSIS — R262 Difficulty in walking, not elsewhere classified: Secondary | ICD-10-CM

## 2020-04-06 NOTE — Therapy (Signed)
[facility] [address] [place], [place], [address] Phone: [phone]   Fax:  [phone]  Physical Therapy Treatment/Progress Note  Patient Details  Name: Katrina Rivera MRN: [id number] Date of Birth: 1945/04/01 Referring Provider (PT): Glean Salvo, NP  Physical Therapy Progress Note   Dates of Reporting Period: 01/21/21 - 04/06/20  See Note below for Objective Data and Assessment of Progress/Goals.  Thank you for the referral of this patient. Adelfa Koh, PT, DPT   Encounter Date: 04/06/2020   PT End of Session - 04/06/20 1109    Visit Number 10    Number of Visits 13    Date for PT Re-Evaluation 05/15/20    Authorization Type [employer]    Progress Note Due on Visit 10    PT Start Time 1101    PT Stop Time 1144    PT Time Calculation (min) 43 min    Equipment Utilized During Treatment Gait belt    Activity Tolerance Patient tolerated treatment well    Behavior During Therapy WFL for tasks assessed/performed           Past Medical History:  Diagnosis Date  . Addison's disease (HCC)    takes Solu Cortef daily  . Anemia    takes Ferrous Sulfate daily  . Anxiety    takes Xanax nightly  . Arthritis   . Chronic back pain    stenosis  . Depression    takes Cymbalta daily  . Dizziness    if b/p drops   . Fibromyalgia   . Fibromyalgia   . GERD (gastroesophageal reflux disease)   . Headache(784.0)   . History of blood transfusion    no abnormal reaction noted  . History of bronchitis    many yrs ago   . Hypokalemia    takes Potassium daily  . Hypotension   . Hypotension    takes Florinef daily  . IBS (irritable bowel syndrome)    takes Librarian, academic daily  . Insomnia    takes Trazodone nightly  . Joint pain   . Multiple sclerosis (HCC)    doesn't take any meds  . Multiple sclerosis (HCC)   . Nocturia   . Osteoporosis   . Peripheral neuropathy   . Restless leg syndrome   .  Seasonal allergies    takes Zyrtec daily;uses Flonase daily as needed  . Syncope   . Urinary frequency    takes Flomax daily  . Weakness    numbness and tingling    Past Surgical History:  Procedure Laterality Date  . ABDOMINAL HYSTERECTOMY  1988  . APPENDECTOMY  1988  . CESAREAN SECTION  1973/1977   x2  . CHOLECYSTECTOMY  1997  . COLECTOMY  1990  . COLONOSCOPY    . ESOPHAGOGASTRODUODENOSCOPY    . EYE SURGERY     bilateral - /w IOL  . FRACTURE SURGERY Right    rods and screws  . LUMBAR LAMINECTOMY/DECOMPRESSION MICRODISCECTOMY Left 01/24/2013   Procedure: LUMBAR FIVE TO SACRAL ONE LUMBAR LAMINECTOMY/DECOMPRESSION MICRODISCECTOMY 1 LEVEL;  Surgeon: Tia Alert, MD;  Location: [facility];  Service: Neurosurgery;  Laterality: Left;  Marland Kitchen MAXIMUM ACCESS (MAS)POSTERIOR LUMBAR INTERBODY FUSION (PLIF) 1 LEVEL N/A 06/18/2014   Procedure: MAXIMUM ACCESS SURGERY POSTERIOR LUMBAR INTERBODY FUSION LUMBAR FIVE TO SACRAL ONE ;  Surgeon: Tia Alert, MD;  Location: [facility];  Service: Neurosurgery;  Laterality: N/A;    There were no vitals filed for this visit.  Subjective Assessment - 04/06/20 1106    Subjective Patient continues to have burning in L Knee and feels as it is slipping. Has MRI scheduled tomorrow.    Pertinent History MS, prior L knee fracture, R toe fracture, R hip fracture    Limitations Standing;Walking    How long can you sit comfortably? n/a    How long can you stand comfortably? n/a    How long can you walk comfortably? n/a    Patient Stated Goals Improve stability going between different surfaces    Currently in Pain? Yes    Pain Score 8     Pain Location Knee    Pain Orientation Left    Pain Descriptors / Indicators Aching;Burning    Pain Type Acute pain                             [facility] Adult PT Treatment/Exercise - 04/06/20 0001      Ambulation/Gait   Ambulation/Gait Yes    Ambulation/Gait Assistance 5: Supervision     Ambulation/Gait Assistance Details completed ambulation with B Foot Up Brace x 200 ft without AD. Continue to demo improved heel toe pattern with braces donned.    Ambulation Distance (Feet) 200 Feet    Assistive device None    Gait Pattern Step-through pattern;Decreased stance time - left;Decreased step length - left;Decreased dorsiflexion - right;Decreased dorsiflexion - left;Left flexed knee in stance;Shuffle;Left foot flat;Right foot flat;Trunk flexed    Ambulation Surface Level;Indoor      High Level Balance   High Level Balance Activities Side stepping;Backward walking    High Level Balance Comments At countertop completed side stepping and backwards walking, x 3 laps down and back each with intermittent UE support. verbal cues for step length with backwards walking.      Self-Care   Self-Care Other Self-Care Comments    Other Self-Care Comments  PT provided order for Foot Up Braces. PT educating that insurance may not cover, but patient reporting she will reach out regarding this.      Exercises   Exercises Other Exercises    Other Exercises  Completed B foot instrinsic exercise (per patient request) to work on foot strengthening, completed towel scrunches, x 10 reps. PT educating on how to complete at home, but reporting this will not help improve DF control with gait.               Balance Exercises - 04/06/20 0001      Balance Exercises: Standing   SLS with Vectors Solid surface;Intermittent upper extremity assist;Limitations    SLS with Vectors Limitations standing on firm surface completed toe taps to cones x 15 reps alternating, completed first 10 with UE support, with last 5 reps reduced UE support and CGA from PT. increased balance challenge noted.    Stepping Strategy Anterior;Posterior;Foam/compliant surface;UE support;Limitations    Stepping Strategy Limitations standing on red balance completed anterior/posterior stepping strategy x 10 reps each, UE support required  initially, progressed to no UE support and CGA from PT.    Balance Beam standing on red balance beam completed holding steady, completed with eyes open and wide BOS 2 x 1 minute each, CGA throughout. Then completed standing with OH shoulder flexion x 10 reps bilaterally ot further challenge balance.               PT Short Term Goals - 04/06/20 1110      PT SHORT TERM GOAL #  1   Title Patient will report improved compliance with HEP completing >/= 3x/week    Baseline ongoing, not completing due to knee pain.    Time 3    Period Weeks    Status On-going    Target Date 04/06/20             PT Long Term Goals - 03/16/20 1333      PT LONG TERM GOAL #1   Title Pt will be demonstrate independence with advanced balance HEP and improved compliance (>/= 3x/week). (ALL LTGs Due: 04/27/20)    Baseline reports independence with exercises, decreased compliance    Time 6    Period Weeks    Status Revised    Target Date 04/27/20      PT LONG TERM GOAL #2   Title Pt will improve her DGI score to at least 20/24 to demo decreased fall risk    Baseline 17/24 on 01/22/20; 19/24 on 03/16/20    Time 6    Period Weeks    Status On-going      PT LONG TERM GOAL #3   Title Pt will improve gait speed to at least 2.75 ft/second to demo improved community mobility    Baseline 1.95 ft/sec; 2.44 ft/sec    Time 6    Period Weeks    Status Revised      PT LONG TERM GOAL #4   Title Patient will demosntrate ability to acsend/descend ramp with no LOB and supervision    Baseline increased sway with descent, CGA    Time 6    Period Weeks    Status New      PT LONG TERM GOAL #5   Title Patient will demonstrate ability to complete visual scanning x 100 ft with no imbalance noted    Baseline increased sway with head turns/scanning    Time 6    Period Weeks    Status New                 Plan - 04/06/20 1200    Clinical Impression Statement Continued gait training and balance training with B  Foot Up Braces donned, patient limtied during session due to L Knee Pain. Continued to assess throughout session, as patient is scheduled ot have MRI tomorrow. Depending upon results, PT may be placed on hold. Patient is making slow steady progress with PT services and will continue to benefit from skilled services to further address impairments, maximize functional mobility, and reduce fall risk.    Personal Factors and Comorbidities Age;Comorbidity 1;Comorbidity 2    Comorbidities MS, history of multiple fractures (R hip, R toes, L knee)    Examination-Activity Limitations Locomotion Level;Squat;Stairs    Examination-Participation Restrictions Community Activity;Yard Work    Conservation officer, historic buildings Evolving/Moderate complexity    Rehab Potential Good    PT Frequency 1x / week    PT Duration 6 weeks    PT Treatment/Interventions Aquatic Therapy;Cryotherapy;Electrical Stimulation;Moist Heat;DME Instruction;Ultrasound;Gait training;Stair training;Functional mobility training;Therapeutic activities;Therapeutic exercise;Balance training;Neuromuscular re-education;Patient/family education;Manual techniques;Orthotic Fit/Training;Dry needling;Taping;Vestibular;Passive range of motion    PT Next Visit Plan Continue balance on incline. Walking with Head turns. Heel Toe Pattern with Gait. Continue balance exercises. Work on ankle strengthening. Work on ankle strategies/stepping strategy. Rockerboard    Consulted and Agree with Plan of Care Patient           Patient will benefit from skilled therapeutic intervention in order to improve the following deficits and impairments:  Abnormal gait,Difficulty walking,Decreased coordination,Decreased balance,Decreased  mobility,Decreased strength,Postural dysfunction  Visit Diagnosis: Muscle weakness (generalized)  Difficulty in walking, not elsewhere classified  Other abnormalities of gait and mobility     Problem List Patient Active Problem  List   Diagnosis Date Noted  . Restless leg syndrome   . Chronic back pain   . Low back pain without sciatica 11/01/2018  . Essential hypertension   . SBO (small bowel obstruction) (HCC) 09/01/2016  . Fibromyalgia   . Abdominal pain, vomiting, and diarrhea   . Dehydration   . Somnolence, daytime 10/22/2015  . Fatigue 10/22/2015  . Chronic leg pain 06/29/2015  . S/P lumbar spinal fusion 06/18/2014  . Abnormality of gait 02/18/2013  . S/P lumbar microdiscectomy 01/24/2013  . Hypokalemia 12/24/2012  . Sweating abnormality 10/19/2012  . Dysautonomia orthostatic hypotension syndrome (HCC) 08/16/2012  . Hereditary and idiopathic peripheral neuropathy 08/16/2012  . Chronic adrenal insufficiency (HCC) 08/16/2012  . MS (multiple sclerosis) (HCC) 08/15/2012    Tempie Donning, PT, DPT 04/06/2020, 12:03 PM  [facility] [address] [place], [place], [address] Phone: [phone]   Fax:  [phone]  Name: Katrina Rivera MRN: [id number] Date of Birth: 08/06/1945

## 2020-04-09 ENCOUNTER — Other Ambulatory Visit: Payer: Self-pay | Admitting: Sports Medicine

## 2020-04-09 DIAGNOSIS — M25562 Pain in left knee: Secondary | ICD-10-CM

## 2020-04-13 ENCOUNTER — Other Ambulatory Visit: Payer: Self-pay

## 2020-04-13 ENCOUNTER — Ambulatory Visit: Payer: PPO

## 2020-04-13 DIAGNOSIS — M6281 Muscle weakness (generalized): Secondary | ICD-10-CM | POA: Insufficient documentation

## 2020-04-13 DIAGNOSIS — R262 Difficulty in walking, not elsewhere classified: Secondary | ICD-10-CM

## 2020-04-13 DIAGNOSIS — R2689 Other abnormalities of gait and mobility: Secondary | ICD-10-CM

## 2020-04-13 NOTE — Therapy (Signed)
[facility] [address] [place], [place], [address] Phone: [phone]   Fax:  [phone]  Physical Therapy Treatment Arrived - No Charge  Patient Details  Name: Katrina Rivera MRN: [id number] Date of Birth: 1945/03/12 Referring Provider (PT): Glean Salvo, NP   Encounter Date: 04/13/2020   PT End of Session - 04/13/20 1104    Visit Number 10   arrived - no charge   Number of Visits 13    Date for PT Re-Evaluation 05/15/20    Authorization Type [employer]    Progress Note Due on Visit 10    PT Start Time --    Equipment Utilized During Treatment Gait belt    Activity Tolerance Patient tolerated treatment well    Behavior During Therapy [facility] for tasks assessed/performed           Past Medical History:  Diagnosis Date  . Addison's disease (HCC)    takes Solu Cortef daily  . Anemia    takes Ferrous Sulfate daily  . Anxiety    takes Xanax nightly  . Arthritis   . Chronic back pain    stenosis  . Depression    takes Cymbalta daily  . Dizziness    if b/p drops   . Fibromyalgia   . Fibromyalgia   . GERD (gastroesophageal reflux disease)   . Headache(784.0)   . History of blood transfusion    no abnormal reaction noted  . History of bronchitis    many yrs ago   . Hypokalemia    takes Potassium daily  . Hypotension   . Hypotension    takes Florinef daily  . IBS (irritable bowel syndrome)    takes Librarian, academic daily  . Insomnia    takes Trazodone nightly  . Joint pain   . Multiple sclerosis (HCC)    doesn't take any meds  . Multiple sclerosis (HCC)   . Nocturia   . Osteoporosis   . Peripheral neuropathy   . Restless leg syndrome   . Seasonal allergies    takes Zyrtec daily;uses Flonase daily as needed  . Syncope   . Urinary frequency    takes Flomax daily  . Weakness    numbness and tingling    Past Surgical History:  Procedure Laterality Date  . ABDOMINAL HYSTERECTOMY  1988  .  APPENDECTOMY  1988  . CESAREAN SECTION  1973/1977   x2  . CHOLECYSTECTOMY  1997  . COLECTOMY  1990  . COLONOSCOPY    . ESOPHAGOGASTRODUODENOSCOPY    . EYE SURGERY     bilateral - /w IOL  . FRACTURE SURGERY Right    rods and screws  . LUMBAR LAMINECTOMY/DECOMPRESSION MICRODISCECTOMY Left 01/24/2013   Procedure: LUMBAR FIVE TO SACRAL ONE LUMBAR LAMINECTOMY/DECOMPRESSION MICRODISCECTOMY 1 LEVEL;  Surgeon: Tia Alert, MD;  Location: [facility];  Service: Neurosurgery;  Laterality: Left;  Marland Kitchen MAXIMUM ACCESS (MAS)POSTERIOR LUMBAR INTERBODY FUSION (PLIF) 1 LEVEL N/A 06/18/2014   Procedure: MAXIMUM ACCESS SURGERY POSTERIOR LUMBAR INTERBODY FUSION LUMBAR FIVE TO SACRAL ONE ;  Surgeon: Tia Alert, MD;  Location: [facility];  Service: Neurosurgery;  Laterality: N/A;    There were no vitals filed for this visit.   Subjective Assessment - 04/13/20 1104    Subjective --    Pertinent History MS, prior L knee fracture, R toe fracture, R hip fracture    Limitations Standing;Walking    How long can you sit comfortably? n/a  How long can you stand comfortably? n/a    How long can you walk comfortably? n/a    Patient Stated Goals Improve stability going between different surfaces             Patient arrived to therapy session with reports of continued increased pain in L Knee that continues to affect daily ambulation and ability to complete balance activities limiting overall participation and functional mobility. Patient reporting that CT scan is scheduled for 3/18 to allow for further assessment of Knee. PT educating placing therapy on hold until this time until area can be further assessed and to allow for improved participation in therapy services. Patient verbalized agreement with plan. Will continue to address as needed.        PT Short Term Goals - 04/06/20 1110      PT SHORT TERM GOAL #1   Title Patient will report improved compliance with HEP completing >/= 3x/week     Baseline ongoing, not completing due to knee pain.    Time 3    Period Weeks    Status On-going    Target Date 04/06/20             PT Long Term Goals - 03/16/20 1333      PT LONG TERM GOAL #1   Title Pt will be demonstrate independence with advanced balance HEP and improved compliance (>/= 3x/week). (ALL LTGs Due: 04/27/20)    Baseline reports independence with exercises, decreased compliance    Time 6    Period Weeks    Status Revised    Target Date 04/27/20      PT LONG TERM GOAL #2   Title Pt will improve her DGI score to at least 20/24 to demo decreased fall risk    Baseline 17/24 on 01/22/20; 19/24 on 03/16/20    Time 6    Period Weeks    Status On-going      PT LONG TERM GOAL #3   Title Pt will improve gait speed to at least 2.75 ft/second to demo improved community mobility    Baseline 1.95 ft/sec; 2.44 ft/sec    Time 6    Period Weeks    Status Revised      PT LONG TERM GOAL #4   Title Patient will demosntrate ability to acsend/descend ramp with no LOB and supervision    Baseline increased sway with descent, CGA    Time 6    Period Weeks    Status New      PT LONG TERM GOAL #5   Title Patient will demonstrate ability to complete visual scanning x 100 ft with no imbalance noted    Baseline increased sway with head turns/scanning    Time 6    Period Weeks    Status New            Patient will benefit from skilled therapeutic intervention in order to improve the following deficits and impairments:     Visit Diagnosis: Muscle weakness (generalized)  Difficulty in walking, not elsewhere classified  Other abnormalities of gait and mobility     Problem List Patient Active Problem List   Diagnosis Date Noted  . Restless leg syndrome   . Chronic back pain   . Low back pain without sciatica 11/01/2018  . Essential hypertension   . SBO (small bowel obstruction) (HCC) 09/01/2016  . Fibromyalgia   . Abdominal pain, vomiting, and diarrhea   .  Dehydration   . Somnolence, daytime 10/22/2015  .  Fatigue 10/22/2015  . Chronic leg pain 06/29/2015  . S/P lumbar spinal fusion 06/18/2014  . Abnormality of gait 02/18/2013  . S/P lumbar microdiscectomy 01/24/2013  . Hypokalemia 12/24/2012  . Sweating abnormality 10/19/2012  . Dysautonomia orthostatic hypotension syndrome (HCC) 08/16/2012  . Hereditary and idiopathic peripheral neuropathy 08/16/2012  . Chronic adrenal insufficiency (HCC) 08/16/2012  . MS (multiple sclerosis) (HCC) 08/15/2012    Tempie Donning, PT, DPT 04/13/2020, 11:28 AM  [facility] [address] [place], [place], [address] Phone: [phone]   Fax:  [phone]  Name: Katrina Rivera MRN: [id number] Date of Birth: November 11, 1945

## 2020-04-15 ENCOUNTER — Other Ambulatory Visit: Payer: Self-pay | Admitting: Family Medicine

## 2020-04-15 ENCOUNTER — Other Ambulatory Visit: Payer: Self-pay

## 2020-04-15 ENCOUNTER — Ambulatory Visit
Admission: RE | Admit: 2020-04-15 | Discharge: 2020-04-15 | Disposition: A | Discharge status: Home / Self Care | Payer: PPO | Source: Ambulatory Visit | Attending: Family Medicine | Admitting: Family Medicine

## 2020-04-15 DIAGNOSIS — R0602 Shortness of breath: Secondary | ICD-10-CM | POA: Diagnosis not present

## 2020-04-15 DIAGNOSIS — J40 Bronchitis, not specified as acute or chronic: Secondary | ICD-10-CM

## 2020-04-15 DIAGNOSIS — R059 Cough, unspecified: Secondary | ICD-10-CM | POA: Diagnosis not present

## 2020-04-15 DIAGNOSIS — J209 Acute bronchitis, unspecified: Secondary | ICD-10-CM | POA: Diagnosis not present

## 2020-04-20 ENCOUNTER — Ambulatory Visit: Payer: PPO

## 2020-04-22 DIAGNOSIS — M1611 Unilateral primary osteoarthritis, right hip: Secondary | ICD-10-CM | POA: Diagnosis not present

## 2020-04-24 ENCOUNTER — Other Ambulatory Visit: Payer: Self-pay

## 2020-04-24 ENCOUNTER — Ambulatory Visit
Admission: RE | Admit: 2020-04-24 | Discharge: 2020-04-24 | Disposition: A | Discharge status: Home / Self Care | Payer: PPO | Source: Ambulatory Visit | Attending: Sports Medicine | Admitting: Sports Medicine

## 2020-04-24 DIAGNOSIS — M25562 Pain in left knee: Secondary | ICD-10-CM

## 2020-04-24 DIAGNOSIS — M1712 Unilateral primary osteoarthritis, left knee: Secondary | ICD-10-CM | POA: Diagnosis not present

## 2020-04-24 MED ORDER — IOPAMIDOL (ISOVUE-M 200) INJECTION 41%
40.0000 mL | Freq: Once | INTRAMUSCULAR | Status: AC
  Administered 2020-04-24: 40 mL via INTRA_ARTICULAR

## 2020-04-29 NOTE — Progress Notes (Signed)
PATIENT: Katrina Rivera DOB: 05/20/1945  REASON FOR VISIT: follow up HISTORY FROM: patient  HISTORY OF PRESENT ILLNESS: Today 04/30/20  HISTORY Katrina Rivera is a very pleasant 75 year-old right-handed woman, with relapsing remitting multiple sclerosis, was treated with Betaseron 4 -5 years, but could not tolerate the side effect, now is not on any immunomodulation therapy. neuropathic pain of bilateral lower extremities.  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   She has baseline gait difficulty, she fell in May 2013, fractured both elbows and her left knee cap. She intermittently has been using a cane or walker.  She could not tolerate Requip because of nausea. She has been on gabapentin 300 mg 3 bid. She gained significant weightgainwhen on Lyrica, and therefore was switched back to gabapentin. She has orthostatic hypotension, and is on midodrine 2.5 mg and fludrocortisone 0.1 mg 1 in AM and 2 at night, and she is also on 20 mg of hydrocortisone.  She complains of bilateral lower extremity burning stinging sensation, getting worse when she sits still, difficulty falling into sleep, she has the urge to move because of bilateral extremity discomfort, has tried Requip, could not tolerate it because of GI side effects, Neuprol patch does not work either, she also tried Elavil, Cymbalta in the past, could not tolerate it due to side effect.   Jan 01/2014: She came in urgently for acute worsening of her  generalized condition since her lumbar decompression surgery in January 24 2013 by Dr. Marikay Alar, prior to surgery, she suffered left-sided low back pain, radiating pain to bilateral lower extremity, left is much worse than her right side, there was left L5-S1 extraforaminal herniated nucleus pulposus with left L5 radiculopathy she had left L5-S1 extraforaminal microdiscectomy utilizing microscopic dissection.  She had overnight stay at the hospital the day after surgery, she fell when trying to get up using bathroom, landed on her left side, surgery did help her lower back pain, radiating pain to her left leg, but she complained of worsening bilateral lower extremity deep achy pain, worsening gait difficulty, she has not used walker for 3 years, now she began to use her walker, she felt her skin is so tight at bilateral lower extremity, left leg swelling, also has difficulty swallowing, blurred vision, worsening fatigue, difficulty concentration, she has not had MRI evaluation for her multiple sclerosis for many years, is not on any immunomodulation therapy   UPDATE Mar 01, 2014:She came in with a list of complains, much worse compared to presurgical level. She complains of blurred vision, difficulty sleeping, difficulty concentrating, dizziness, worsening gait difficulty, The most bothersome symptoms are bilateral lower extremity pain, constant, knife cutting pain, left worse than right Left leg showed no DVT on doppler study. She had 15 LB weight gain over one month, more difficulty sleepy, body shaking, more difficulty sleepy.  She has tried fentanyl patch 50 mcg, Trileptal, Neurontin without helping her symptoms .She is going to be seen by her surgeon Dr. Yetta Barre in 3 days  We have reviewed MRI of the brain cervical lumbar spine together, these were  done at [facility] imaging in January 2015 MRI lumbar: L5-S1: disc bulging and facet hypertrophy, status post microdiscectomy on the left, with expected  post-surgical changes. Multi-level facet hypertrophy, with no spinal stenosis or foraminal narrowing.  MRI cervical: Possible small chronic demyelinating plaque at C6 on the right side. No abnormal enhancing lesions  MRI brain: Multiple supratentorial and 1 infratentorial chronic demyelinating plaques. No acute plaques. Mild cerebellar tonsillar ectopia there was no significant change in the brain lesions,   UPDATE 08/22/13 Patient returns for follow up.Her biggest complaint today is restless legs. She has never tried Neupro. She has had multiple trials of medications for her chronic pain. She is currently taking fentanyl, Trileptal, Cymbalta, lidocaine gel  UPDATE Feb 27 2014:She is not on any long-term immunomodulation therapy for her relapsing remitting multiple sclerosis, neurological deficit has been fairly stable, she has baseline gait difficulty, the most bothersome symptoms for her is low back pain, bilateral lower extremity deep achy pain, radiating pain from left lower back to her left leg,  She was recently found to have very low cortisol level, under endocrinologist Dr. Remus Blake care, UPDATE July 23 2014: She had lumbar decompression by Dr. Marikay Alar in Jun 18 2014, which did help her low back pain, but now she experienced worsening left lower extremity spasm, left calf spasm so hard, as if a knot was tied, difficulty walking, she was giving a steroid package, no help, She complained excessive weight gain with Lyrica, Neurontin did not help,  UPDATE July 27th 2016: She came in with a list of complaints, continue complains of unbalanced, staggering, generalized weakness, numbness tingling at bilateral lower extremity, worsening at her left leg, frequent urination, difficulty concentrating, difficulty with multitasking  UPDATE Oct 22 2014: She had a syncope episodeon October 08 2014, was taken to the emergency room, have reviewed ED record, blood pressure was 200/60s laboratory  showed normal CBC, CMP, this happened in the setting of missing her hydrocortisone dose, because of nausea, lack of appetite, dehydration, she is taking hydrocortisone because of Addison's disease, now she is on hydrocortisone shots,She is back to her baseline now, mild gait difficulty, also complains of anxiety, constant bilateral lower chamber paresthesia, she wants to get off Cymbalta 30 mg daily, worry about the long-term side effect. She also complains of bilateral neck, shoulder or upper extremity itching,  UPDATE Apr 05 2016: She had a spinal stimulator placement by neurosurgeon Dr. Buck Mam in January third 2018, which has helped her low back pain,and leg pain.  She had sinus infection, was treated with Zpack and nausea, She has iron infusion in Feb 2018, She noticed that she has worsening gait abnormality, She also has diffuse body achy pain.She has tried Flexeril without benefit, previously tried and failed gabapentin, Cymbalta, fentanyl patch,  CPK was normal 39 WBC showed hemoglobin of 10 point 8, which was mildly decreased normal free T4, TSH, CMP, creat 1.1, normal B12,  UPDATESept 24th 2020: She is overall stable, ambulate without assistant, but with mild unsteady gait, she had a spinal stimulator placement in 2018, is no longer a candidate for MRI, I personally reviewed previous MRIs in 2015, MRI of the brain, multiple supratentorium, and one infratentorial chronic demyelinating plaque, no acute abnormality. MRI of cervical spine, possible small chronic demyelinating plaque at right C6. MRI of lumbar spine, multilevel degenerative changes, postsurgical changes L5-S1 with evidence of left microdiscectomy.  She is now taking Horizant 300 mg every night, which does help her restless leg symptoms some,  but at the end of the day, she complains of whole body tightness,  Laboratory evaluation July 2020, normal CMP, TSH, free T4, BMP with GFR of 56, vitamin D of 87,  Update  May 02, 2019 SS: She is here with her husband. She has spinal stimulator in place since 2018, no longer candidate for MRI. In the afternoon, she may feel tightness in her trunk, feels like her shirt is too tight. Takes Horizant  At 5 pm, takes OTC sleep aid, along with remeron. She is now sleeping through the night, pain isn't waking her. Walking is doing well. She feels she is stable overall. No assistive device. No falls. Keeps grandkids a few days a week. Neuropathy pain is mostly at night, not so much during the day. Feels overall stable.   Update January 20, 2020 SS:  Here with husband, notices in feet when walking on different surfaces, feels off balance, in store use a car, feels like on ice. Burning in feet, more in the day now, is bothering her more. Is sleeping well at night, taking Horizant 600 mg 5 pm. Does fine at the house, keeps grandson and is fine, but on pavement, feels like walking ice. Some near falls. Has spinal stimulator, sometimes makes legs hurt, may turn it off, sees Dr. Yetta BarreJones, past due to adjustment. Husband doesn't notice any difference, but she hasn't exercised in 6 months. Feels cold at times, uses heated throw. More anxiety lately. Symptoms started last 1-2 months.   Update April 30, 2020 SS: Here today with her husband:  Needs battery replaced in spinal stimulator, Dr. Yetta BarreJones mentioned a new battery that would be MRI compatible. Has seen Dr. Yetta BarreJones recently, pending insurance approval.   On Horizant 600 mg at 5PM, no longer helps RLS symptoms, claims took Levaquin 1 month ago for sinus infection, stopped working since then. Can fall asleep, wakes up at 2 AM, RLS symptoms wake her up. Has tried Neupro, mirapex, Requip before. Also started cortisone injection around same time Horizant started wearing off (last 6 weeks, 2 injections in hip, 2 in left knee), may be trigger.   Seeing ortho, needs left knee replacement, doing injections. Was in PT for walking, paused it for now,  until knee issues figured out. Using rollator walker, left knee is giving out. Got the bilateral [place]ur Foot Up Drop Foot Braces.  REVIEW OF SYSTEMS: Out of a complete 14 system review of symptoms, the patient complains only of the following symptoms, and all other reviewed systems are negative.  Walking difficulty  ALLERGIES: No Known Allergies  HOME MEDICATIONS: Outpatient Medications Prior to Visit  Medication Sig Dispense Refill  . Acetaminophen (TYLENOL 8 HOUR ARTHRITIS PAIN PO) Take 1-2 tablets by mouth daily.    Marland Kitchen. alfuzosin (UROXATRAL) 10 MG 24 hr tablet Take 10 mg by mouth daily with breakfast.    . B Complex Vitamins (VITAMIN B COMPLEX PO) Take by mouth.    . cephALEXin (KEFLEX) 250 MG capsule 250 mg See admin instructions. Take 1 tablet by mouth every other night at bedtime.    . Cholecalciferol (VITAMIN D3 PO) Take 1 tablet by mouth daily.    Marland Kitchen. dicyclomine (BENTYL) 10 MG capsule Take 10 mg by mouth 4 (four) times daily -  before meals and at bedtime. Take 1 tablet by mouth 30 minutes before meals and at bedtime.    . Ferrous Sulfate Dried 200 (65 FE) MG TABS Take 65 mg by mouth daily.    . fludrocortisone (  FLORINEF) 0.1 MG tablet TAKE 2 TABLETS IN THE PM. 180 tablet 0  . Gabapentin Enacarbil (HORIZANT) 600 MG TBCR Take 1 tablet (600 mg total) by mouth at bedtime. 90 tablet 3  . methylPREDNISolone (MEDROL) 4 MG tablet TAKE 1 TABLET (4MG ) AT BREAKFAST AND 1/2 TABLET (2MG ) AT 4PM. 135 tablet 2  . mirtazapine (REMERON) 15 MG tablet Take 15 mg by mouth at bedtime.    . Multiple Vitamin (MULTIVITAMIN) tablet Take 1 tablet by mouth daily.    Bertram Gala Glycol-Propyl Glycol (SYSTANE OP) Apply 2 drops to eye daily as needed (for dry eyes). For both eyes     No facility-administered medications prior to visit.    PAST MEDICAL HISTORY: Past Medical History:  Diagnosis Date  . Addison's disease (HCC)    takes Solu Cortef daily  . Anemia    takes Ferrous Sulfate daily  . Anxiety     takes Xanax nightly  . Arthritis   . Chronic back pain    stenosis  . Depression    takes Cymbalta daily  . Dizziness    if b/p drops   . Fibromyalgia   . Fibromyalgia   . GERD (gastroesophageal reflux disease)   . Headache(784.0)   . History of blood transfusion    no abnormal reaction noted  . History of bronchitis    many yrs ago   . Hypokalemia    takes Potassium daily  . Hypotension   . Hypotension    takes Florinef daily  . IBS (irritable bowel syndrome)    takes Librarian, academic daily  . Insomnia    takes Trazodone nightly  . Joint pain   . Multiple sclerosis (HCC)    doesn't take any meds  . Multiple sclerosis (HCC)   . Nocturia   . Osteoporosis   . Peripheral neuropathy   . Restless leg syndrome   . Seasonal allergies    takes Zyrtec daily;uses Flonase daily as needed  . Syncope   . Urinary frequency    takes Flomax daily  . Weakness    numbness and tingling    PAST SURGICAL HISTORY: Past Surgical History:  Procedure Laterality Date  . ABDOMINAL HYSTERECTOMY  1988  . APPENDECTOMY  1988  . CESAREAN SECTION  1973/1977   x2  . CHOLECYSTECTOMY  1997  . COLECTOMY  1990  . COLONOSCOPY    . ESOPHAGOGASTRODUODENOSCOPY    . EYE SURGERY     bilateral - /w IOL  . FRACTURE SURGERY Right    rods and screws  . LUMBAR LAMINECTOMY/DECOMPRESSION MICRODISCECTOMY Left 01/24/2013   Procedure: LUMBAR FIVE TO SACRAL ONE LUMBAR LAMINECTOMY/DECOMPRESSION MICRODISCECTOMY 1 LEVEL;  Surgeon: Tia Alert, MD;  Location: [facility];  Service: Neurosurgery;  Laterality: Left;  Marland Kitchen MAXIMUM ACCESS (MAS)POSTERIOR LUMBAR INTERBODY FUSION (PLIF) 1 LEVEL N/A 06/18/2014   Procedure: MAXIMUM ACCESS SURGERY POSTERIOR LUMBAR INTERBODY FUSION LUMBAR FIVE TO SACRAL ONE ;  Surgeon: Tia Alert, MD;  Location: [facility];  Service: Neurosurgery;  Laterality: N/A;    FAMILY HISTORY: Family History  Problem Relation Age of Onset  . Hypertension Mother   . Stroke Mother   . Heart attack  Father   . Tremor Brother     SOCIAL HISTORY: Social History   Socioeconomic History  . Marital status: Married    Spouse name: [name]  . Number of children: 2  . Years of education: 75  . Highest education level: Not on file  Occupational History  Employer: DISABLED    Comment: Disabled  Tobacco Use  . Smoking status: Never Smoker  . Smokeless tobacco: Never Used  Vaping Use  . Vaping Use: Never used  Substance and Sexual Activity  . Alcohol use: No  . Drug use: No  . Sexual activity: Not on file  Other Topics Concern  . Not on file  Social History Narrative   Pt lives at home with spouse. ([name])   Caffeine Use: 1 cups daily.   Right handed.   Disabled.   Education - high school   Patient has two adult children.   Social Determinants of Health   Financial Resource Strain: Not on file  Food Insecurity: Not on file  Transportation Needs: Not on file  Physical Activity: Not on file  Stress: Not on file  Social Connections: Not on file  Intimate Partner Violence: Not on file   PHYSICAL EXAM  Vitals:   04/30/20 0934  BP: (!) 184/89  Pulse: 82  Weight: 114 lb (51.7 kg)  Height: 5' (1.524 m)   Body mass index is 22.26 kg/m.  Generalized: Well developed, in no acute distress   Neurological examination  Mentation: Alert oriented to time, place, history taking. Follows all commands speech and language fluent Cranial nerve II-XII: Pupils were equal round reactive to light. Extraocular movements were full, visual field were full on confrontational test. Facial sensation and strength were normal. Head turning and shoulder shrug  were normal and symmetric. Motor: Good strength all extremities Sensory: Sensory testing is intact to soft touch on all 4 extremities. Pinprick, vibration sensation to bilateral lower extremities is normal. Coordination: Cerebellar testing reveals good finger-nose-finger and heel-to-shin bilaterally.  Gait and station: Has to push off from  seated position to stand, gait is cautious, mildly unsteady Reflexes: Deep tendon reflexes are symmetric and normal bilaterally.   DIAGNOSTIC DATA (LABS, IMAGING, TESTING) - I reviewed patient records, labs, notes, testing and imaging myself where available.  Lab Results  Component Value Date   WBC 7.8 09/18/2019   HGB 13.2 09/18/2019   HCT 41.5 09/18/2019   MCV 96.5 09/18/2019   PLT 218 09/18/2019      Component Value Date/Time   NA 139 11/11/2019 1007   NA 140 02/06/2017 1126   K 4.1 11/11/2019 1007   CL 103 11/11/2019 1007   CO2 28 11/11/2019 1007   GLUCOSE 93 11/11/2019 1007   BUN 30 (H) 11/11/2019 1007   BUN 22 02/06/2017 1126   CREATININE 0.97 11/11/2019 1007   CREATININE 1.00 09/18/2019 0814   CREATININE 0.85 11/06/2013 0840   CALCIUM 10.3 11/11/2019 1007   PROT 7.1 09/18/2019 0814   ALBUMIN 4.2 09/18/2019 0814   AST 23 09/18/2019 0814   ALT 15 09/18/2019 0814   ALKPHOS 34 (L) 09/18/2019 0814   BILITOT 0.6 09/18/2019 0814   GFRNONAA 57 (L) 09/18/2019 0814   GFRAA >60 09/18/2019 0814   No results found for: CHOL, HDL, LDLCALC, LDLDIRECT, TRIG, CHOLHDL No results found for: [id number] Lab Results  Component Value Date   VITAMINB12 1,190 (H) 03/10/2016   Lab Results  Component Value Date   TSH 2.21 08/21/2018   ASSESSMENT AND PLAN 74 y.o. year old female  has a past medical history of Addison's disease (HCC), Anemia, Anxiety, Arthritis, Chronic back pain, Depression, Dizziness, Fibromyalgia, Fibromyalgia, GERD (gastroesophageal reflux disease), Headache(784.0), History of blood transfusion, History of bronchitis, Hypokalemia, Hypotension, Hypotension, IBS (irritable bowel syndrome), Insomnia, Joint pain, Multiple sclerosis (HCC), Multiple sclerosis (HCC), Nocturia,  Osteoporosis, Peripheral neuropathy, Restless leg syndrome, Seasonal allergies, Syncope, Urinary frequency, and Weakness. here wit:  1.  Relapsing remitting multiple sclerosis -Last MRIs in 2015, evidence  of supratentorium, infratentorium, spinal cord involvement -Is no longer on immune modulating therapy -Not MRI candidate due to spinal stimulator  2.  Chronic low back pain, bilateral lower extremity pain, status post spinal stimulator in January 2018 -Has seen Dr. Yetta Barre, considering switching out the battery with one that is MRI compatible, pending insurance approval  3.  Restless leg syndrome 4.  Gait disorder, paresthesia lower extremities -Claims lost benefit 1 month ago, around time of cortisone injections, treatment with Levaquin -Will continue Horizant 600 mg at bedtime, but add in gabapentin 100-200 mg daily PRN for breakthrough RLS symptoms -Has tried and failed Requip, Mirapex, Neupro -PT has been paused, seeing ortho about left knee, needs knee replacement but has to do injections first -BP is increased, needs to check at home, also anxiety, discuss with PCP -Follow-up in 6 months or sooner if needed  I spent 30 minutes of face-to-face and non-face-to-face time with patient.  This included previsit chart review, lab review, study review, order entry, electronic health record documentation, patient education.  Margie Ege, AGNP-C, DNP 04/30/2020, 10:37 AM [facility] [address], [address] [place], [place] [address] [phone]

## 2020-04-30 ENCOUNTER — Ambulatory Visit: Payer: PPO | Admitting: Neurology

## 2020-04-30 ENCOUNTER — Encounter: Payer: Self-pay | Admitting: Neurology

## 2020-04-30 VITALS — BP 184/89 | HR 82 | Ht 60.0 in | Wt 114.0 lb

## 2020-04-30 DIAGNOSIS — G35 Multiple sclerosis: Secondary | ICD-10-CM | POA: Diagnosis not present

## 2020-04-30 DIAGNOSIS — G2581 Restless legs syndrome: Secondary | ICD-10-CM | POA: Diagnosis not present

## 2020-04-30 DIAGNOSIS — R269 Unspecified abnormalities of gait and mobility: Secondary | ICD-10-CM | POA: Diagnosis not present

## 2020-04-30 DIAGNOSIS — E559 Vitamin D deficiency, unspecified: Secondary | ICD-10-CM | POA: Diagnosis not present

## 2020-04-30 DIAGNOSIS — R5383 Other fatigue: Secondary | ICD-10-CM | POA: Diagnosis not present

## 2020-04-30 DIAGNOSIS — M810 Age-related osteoporosis without current pathological fracture: Secondary | ICD-10-CM | POA: Diagnosis not present

## 2020-04-30 MED ORDER — GABAPENTIN 100 MG PO CAPS: ORAL_CAPSULE | ORAL | 3 refills | Status: DC

## 2020-04-30 NOTE — Patient Instructions (Addendum)
Add in gabapentin 100-200 mg as needed for breakthrough pain for RLS symptoms (no more than 200 mg daily) Can keep the Horizant, watch out for drowsiness See you back in 6 months

## 2020-05-04 ENCOUNTER — Ambulatory Visit: Payer: PPO

## 2020-05-04 ENCOUNTER — Ambulatory Visit: Payer: PPO | Admitting: Neurology

## 2020-05-04 DIAGNOSIS — I10 Essential (primary) hypertension: Secondary | ICD-10-CM | POA: Diagnosis not present

## 2020-05-04 DIAGNOSIS — J40 Bronchitis, not specified as acute or chronic: Secondary | ICD-10-CM | POA: Diagnosis not present

## 2020-05-04 DIAGNOSIS — J0100 Acute maxillary sinusitis, unspecified: Secondary | ICD-10-CM | POA: Diagnosis not present

## 2020-05-04 DIAGNOSIS — R051 Acute cough: Secondary | ICD-10-CM | POA: Diagnosis not present

## 2020-05-11 ENCOUNTER — Other Ambulatory Visit: Payer: PPO

## 2020-05-11 ENCOUNTER — Ambulatory Visit: Payer: PPO

## 2020-05-12 DIAGNOSIS — M1712 Unilateral primary osteoarthritis, left knee: Secondary | ICD-10-CM | POA: Diagnosis not present

## 2020-05-13 DIAGNOSIS — M810 Age-related osteoporosis without current pathological fracture: Secondary | ICD-10-CM | POA: Diagnosis not present

## 2020-05-13 DIAGNOSIS — E559 Vitamin D deficiency, unspecified: Secondary | ICD-10-CM | POA: Diagnosis not present

## 2020-05-14 ENCOUNTER — Ambulatory Visit: Payer: PPO | Admitting: Endocrinology

## 2020-05-14 DIAGNOSIS — K5900 Constipation, unspecified: Secondary | ICD-10-CM | POA: Diagnosis not present

## 2020-05-14 DIAGNOSIS — R198 Other specified symptoms and signs involving the digestive system and abdomen: Secondary | ICD-10-CM | POA: Diagnosis not present

## 2020-05-18 ENCOUNTER — Ambulatory Visit: Payer: PPO

## 2020-05-19 DIAGNOSIS — M1712 Unilateral primary osteoarthritis, left knee: Secondary | ICD-10-CM | POA: Diagnosis not present

## 2020-05-26 ENCOUNTER — Other Ambulatory Visit: Payer: Self-pay

## 2020-05-26 ENCOUNTER — Other Ambulatory Visit (INDEPENDENT_AMBULATORY_CARE_PROVIDER_SITE_OTHER): Payer: PPO

## 2020-05-26 DIAGNOSIS — D508 Other iron deficiency anemias: Secondary | ICD-10-CM | POA: Diagnosis not present

## 2020-05-26 DIAGNOSIS — G259 Extrapyramidal and movement disorder, unspecified: Secondary | ICD-10-CM | POA: Diagnosis not present

## 2020-05-26 DIAGNOSIS — Z0000 Encounter for general adult medical examination without abnormal findings: Secondary | ICD-10-CM | POA: Diagnosis not present

## 2020-05-26 DIAGNOSIS — I951 Orthostatic hypotension: Secondary | ICD-10-CM | POA: Diagnosis not present

## 2020-05-26 DIAGNOSIS — M797 Fibromyalgia: Secondary | ICD-10-CM | POA: Diagnosis not present

## 2020-05-26 DIAGNOSIS — I10 Essential (primary) hypertension: Secondary | ICD-10-CM | POA: Diagnosis not present

## 2020-05-26 DIAGNOSIS — D72829 Elevated white blood cell count, unspecified: Secondary | ICD-10-CM | POA: Diagnosis not present

## 2020-05-26 DIAGNOSIS — F5101 Primary insomnia: Secondary | ICD-10-CM | POA: Diagnosis not present

## 2020-05-26 DIAGNOSIS — M1712 Unilateral primary osteoarthritis, left knee: Secondary | ICD-10-CM | POA: Diagnosis not present

## 2020-05-26 DIAGNOSIS — F419 Anxiety disorder, unspecified: Secondary | ICD-10-CM | POA: Diagnosis not present

## 2020-05-26 DIAGNOSIS — E2740 Unspecified adrenocortical insufficiency: Secondary | ICD-10-CM | POA: Diagnosis not present

## 2020-05-26 LAB — BASIC METABOLIC PANEL
BUN: 29 mg/dL — ABNORMAL HIGH (ref 6–23)
CO2: 32 mEq/L (ref 19–32)
Calcium: 12.5 mg/dL — ABNORMAL HIGH (ref 8.4–10.5)
Chloride: 100 mEq/L (ref 96–112)
Creatinine, Ser: 1.01 mg/dL (ref 0.40–1.20)
GFR: 55.55 mL/min — ABNORMAL LOW (ref >60.00)
Glucose, Bld: 95 mg/dL (ref 70–99)
Potassium: 4.1 mEq/L (ref 3.5–5.1)
Sodium: 140 mEq/L (ref 135–145)

## 2020-05-26 LAB — VITAMIN D 25 HYDROXY (VIT D DEFICIENCY, FRACTURES): VITD: 66.62 ng/mL (ref 30.00–100.00)

## 2020-05-28 ENCOUNTER — Other Ambulatory Visit: Payer: PPO

## 2020-06-01 ENCOUNTER — Other Ambulatory Visit: Payer: Self-pay

## 2020-06-01 ENCOUNTER — Ambulatory Visit: Payer: PPO | Admitting: Endocrinology

## 2020-06-01 ENCOUNTER — Encounter: Payer: Self-pay | Admitting: Endocrinology

## 2020-06-01 VITALS — BP 148/82 | HR 76 | Ht 60.0 in | Wt 116.0 lb

## 2020-06-01 DIAGNOSIS — E2749 Other adrenocortical insufficiency: Secondary | ICD-10-CM | POA: Diagnosis not present

## 2020-06-01 DIAGNOSIS — I951 Orthostatic hypotension: Secondary | ICD-10-CM

## 2020-06-01 MED ORDER — FLUDROCORTISONE ACETATE 0.1 MG PO TABS: 0.1000 mg | ORAL_TABLET | Freq: Every day | ORAL | 1 refills | Status: DC

## 2020-06-01 NOTE — Progress Notes (Signed)
Patient ID: Katrina Rivera, female   DOB: 07/19/1945, 75 y.o.   MRN: [id number]   Subjective:      Chief complaint: Follow-up of various issues   PROBLEM 1: Dysautonomic orthostatic hypotension   PAST history: She has had long-standing problems with orthostatic hypotension and also hyponatremia. Has been diagnosed with dysautonomia and has multiple other problems related to autonomic neuropathy.   She has been on Florinef since 2004 previously taking 3 tablets daily along with 5 tablets of potassium which had previously controlled her symptoms well . Because of persistent orthostatic symptoms she had been tried on midodrine on 05/28/12 but this was later stopped when blood pressure increased.  She  had medication adjustments done frequently over the last year for regulating her blood pressure.  RECENT history:   She has  been on variable doses of Florinef long-term, as much as 3 a day previously,   Now taking only 1 tablet daily since 05/2017 She takes this in the morning daily on a regular basis  She is checking her blood pressure regularly and she thinks her blood pressures are overall higher this year Sitting blood pressure may be up to 180 elsewhere.  No headaches However the blood pressure has been as low as 107/64 at home standing up No recent symptoms of lightheadedness and dizziness when getting up from sitting and trying to walk   PROBLEM 2: Hypokalemia previously from high-dose Florinef: With taking lower doses of Florinef she has not required any potassium supplement    Lab Results  Component Value Date   CREATININE 1.01 05/26/2020   BUN 29 (H) 05/26/2020   NA 140 05/26/2020   K 4.1 05/26/2020   CL 100 05/26/2020   CO2 32 05/26/2020     Adrenal Insufficiency:   This is secondary to pituitary dysfunction  Prior testing included Cortrosyn test showing stimulated level of 15.7, baseline 3.9.  Also confirmed by 24 hour urine free cortisol which was  only 3.0.  Although previously she had tolerated oral hydrocortisone and small doses with improvement in her symptoms she did not continue this long-term She was again symptomatic with weight loss, decreased appetite, nausea and also diarrhea Cortrosyn stimulation test on 03/12/14 showed baseline cortisol level of 0.3 and post injection of 1.1 only  She had GI side effects from hydrocortisone and prednisone and was in the past using hydrocortisone injections using insulin syringe   RECENT HISTORY:  She continues to do well with methylprednisolone without any nausea or decreased appetite/weight loss; previously was on hydrocortisone injections because of GI intolerance with oral supplements   She is taking 4 mg in the early morning and half tablet in the late afternoon  Recently she feels fairly good with no weight loss, decreased appetite or nausea Has not missed any doses but did not take any stress doses when she had respiratory infections in the last few months  Sodium and potassium normal  Wt Readings from Last 3 Encounters:  06/01/20 116 lb (52.6 kg)  04/30/20 114 lb (51.7 kg)  01/20/20 114 lb (51.7 kg)     General Endocrinology:   HYPERCALCEMIA:  She has had intermittent hypercalcemia since at least 2018, previously only temporarily associated with renal dysfunction Her PTH level was 31 previously and subsequently 23  However her calcium is variable, previously was consistently upper normal and now unusually high at 12.5  She is not on calcium supplements  She does have osteoporosis as of 2016, taking Prolia from  orthopedic surgeon every 6 months She was told by orthopedic surgeon to take vitamin D but not clear what dose she is taking  Lab Results  Component Value Date   PTH 23 02/06/2017   CALCIUM 12.5 (H) 05/26/2020   PHOS 1.9 (L) 07/23/2006     Allergies as of 06/01/2020      Reactions   Azithromycin    Other reaction(s): stomach upset   Buspirone Hcl     Other reaction(s): palpitations   Doxycycline Hyclate    Other reaction(s): GI upset   Mirtazapine    Other reaction(s): palpitations      Medication List       Accurate as of June 01, 2020  9:11 AM. If you have any questions, ask your nurse or doctor.        alfuzosin 10 MG 24 hr tablet Commonly known as: UROXATRAL Take 10 mg by mouth daily with breakfast.   cephALEXin 250 MG capsule Commonly known as: KEFLEX 250 mg See admin instructions. Take 1 tablet by mouth every other night at bedtime.   dicyclomine 10 MG capsule Commonly known as: BENTYL Take 10 mg by mouth 4 (four) times daily -  before meals and at bedtime. Take 1 tablet by mouth 30 minutes before meals and at bedtime.   Ferrous Sulfate Dried 200 (65 Fe) MG Tabs Take 65 mg by mouth daily.   fludrocortisone 0.1 MG tablet Commonly known as: FLORINEF TAKE 2 TABLETS IN THE PM.   gabapentin 100 MG capsule Commonly known as: Neurontin Take 1-2 tablets as needed for breakthrough pain   Horizant 600 MG Tbcr Generic drug: Gabapentin Enacarbil Take 1 tablet (600 mg total) by mouth at bedtime.   methylPREDNISolone 4 MG tablet Commonly known as: MEDROL TAKE 1 TABLET (4MG ) AT BREAKFAST AND 1/2 TABLET (2MG ) AT 4PM.   mirtazapine 15 MG tablet Commonly known as: REMERON Take 15 mg by mouth at bedtime.   multivitamin tablet Take 1 tablet by mouth daily.   SYSTANE OP Apply 2 drops to eye daily as needed (for dry eyes). For both eyes   TYLENOL 8 HOUR ARTHRITIS PAIN PO Take 1-2 tablets by mouth daily.   VITAMIN B COMPLEX PO Take by mouth.   VITAMIN D3 PO Take 1 tablet by mouth daily.       Review of Systems    Genitourinary: She has had difficulty emptying her bladder and is being treated by urologist   OSTEOPOROSIS: This is being treated by her orthopedic surgeon with Prolia  Previous bone density in 2016 showed the following: AP LUMBAR SPINE L1 through L4 Young Adult T-Score:  -2.4 LEFT FEMUR  NECK: Young Adult T-Score: -3.1  Last bone density in 12/20 showed improvement in her T-scores up to -2.7 at the right femur and 3.6radius Previously in 12/2016 has T score of -3.3 at the dual femur neck right and -2.3 for the L1-L3  Vitamin D deficiency: She is on 1000 units vitamin D3     Objective:   Physical Exam  BP (!) 148/82 (BP Location: Left Arm, Patient Position: Standing, Cuff Size: Normal)   Pulse 76   Ht 5' (1.524 m)   Wt 116 lb (52.6 kg)   SpO2 97%   BMI 22.65 kg/m   Standing blood pressure checked again was 152/80  Assessment:       Adrenal insufficiency:  She has long-standing secondary adrenal insufficiency with fairly typical symptoms at baseline and very low cortisol levels on the stimulation test  Symptoms consistently well controlled on methylprednisolone without evidence of any cushingoid features on exam  She is on usual replacement doses using 4 mg in the morning and 2 mg in the evening Subjectively doing well  Electrolytes normal blood with normal potassium supplements   Orthostatic dysautonomic hypotension syndrome:   Her orthostatic hypotension has been consistently well controlled with 0.1 mg Florinef Sitting blood pressure is relatively higher now Her systolic pressure does go down 20 mm on standing up and at home has been low normal occasionally  Potassium normal   Hypercalcemia: Calcium is markedly increased at 12.5 which is unusual, no recent renal dysfunction or excessive vitamin intake Vitamin D level normal Likely has hyperparathyroidism With no systemic problems or weight loss unlikely that this is malignancy related Most recent chest x-ray was normal also  She is being treated for osteoporosis with Prolia by her orthopedic specialist    Plan:      Continue methylprednisolone Reduce Florinef to every other day  Recheck calcium level today along with vitamin D1, 25 OH along with PTH.  If these are normal will also check PTH  RP  She will follow-up in 6 weeks  Katrina Rivera  Note: This office note was prepared with Insurance underwriter. Any transcriptional errors that result from this process are unintentional.

## 2020-06-01 NOTE — Addendum Note (Signed)
Addended by: Adline Mango I on: 06/01/2020 11:42 AM   Modules accepted: Orders

## 2020-06-01 NOTE — Patient Instructions (Signed)
Stop Vitamin D

## 2020-06-02 DIAGNOSIS — M1712 Unilateral primary osteoarthritis, left knee: Secondary | ICD-10-CM | POA: Diagnosis not present

## 2020-06-11 ENCOUNTER — Telehealth: Payer: Self-pay

## 2020-06-11 ENCOUNTER — Telehealth: Payer: Self-pay | Admitting: *Deleted

## 2020-06-11 NOTE — Telephone Encounter (Signed)
Fax confirmation received for clearance form.  [phone].

## 2020-06-11 NOTE — Telephone Encounter (Signed)
Paperwork completed for left knee replacement.

## 2020-06-11 NOTE — Telephone Encounter (Signed)
Fax sent to [facility] for pre-op assessment. Sent to [phone]

## 2020-06-11 NOTE — Telephone Encounter (Signed)
Received fax from [facility] release for L total knee replacement.

## 2020-06-12 LAB — VITAMIN D 1,25 DIHYDROXY
Vitamin D 1, 25 (OH)2 Total: 36 pg/mL
Vitamin D2 1, 25 (OH)2: <10 pg/mL
Vitamin D3 1, 25 (OH)2: 36 pg/mL

## 2020-06-12 LAB — PTH-RELATED PEPTIDE: PTH-related peptide: <2 pmol/L

## 2020-06-12 LAB — PTH, INTACT AND CALCIUM
Calcium: 10.1 mg/dL (ref 8.7–10.3)
PTH: 38 pg/mL (ref 15–65)

## 2020-07-08 NOTE — Therapy (Signed)
[facility] [address] [place], [place], [address] Phone: [phone]   Fax:  [phone]  Patient Details  Name: Katrina Rivera MRN: [id number] Date of Birth: Jun 13, 1945 Referring Provider:  No ref. provider found  Encounter Date: 07/08/2020  PHYSICAL THERAPY NON VISIT DISCHARGE SUMMARY  Visits from Start of Care: 10  Current functional level related to goals / functional outcomes: Due to significant pain in knee, patient has not returned to PT services since last visit. Therefore will be d/c from PT services and will need new referral to resume at this time.    Remaining deficits: Balance Deficits, Weakness, Abnormal Gait   Education / Equipment: HEP provided  Plan: Patient agrees to discharge.  Patient goals were not met. Patient is being discharged due to not returning since the last visit.  ?????            PT Short Term Goals - 04/06/20 1110      PT SHORT TERM GOAL #1   Title Patient will report improved compliance with HEP completing >/= 3x/week    Baseline ongoing, not completing due to knee pain.    Time 3    Period Weeks    Status On-going    Target Date 04/06/20           PT Long Term Goals - 03/16/20 1333      PT LONG TERM GOAL #1   Title Pt will be demonstrate independence with advanced balance HEP and improved compliance (>/= 3x/week). (ALL LTGs Due: 04/27/20)    Baseline reports independence with exercises, decreased compliance    Time 6    Period Weeks    Status Revised    Target Date 04/27/20      PT LONG TERM GOAL #2   Title Pt will improve her DGI score to at least 20/24 to demo decreased fall risk    Baseline 17/24 on 01/22/20; 19/24 on 03/16/20    Time 6    Period Weeks    Status On-going      PT LONG TERM GOAL #3   Title Pt will improve gait speed to at least 2.75 ft/second to demo improved community mobility    Baseline 1.95 ft/sec; 2.44 ft/sec    Time 6    Period  Weeks    Status Revised      PT LONG TERM GOAL #4   Title Patient will demosntrate ability to acsend/descend ramp with no LOB and supervision    Baseline increased sway with descent, CGA    Time 6    Period Weeks    Status New      PT LONG TERM GOAL #5   Title Patient will demonstrate ability to complete visual scanning x 100 ft with no imbalance noted    Baseline increased sway with head turns/scanning    Time 6    Period Weeks    Status New             Jones Bales, PT, DPT 07/08/2020, 7:34 AM  [facility] [address] [place], [place], [address] Phone: [phone]   Fax:  [phone]

## 2020-07-09 ENCOUNTER — Other Ambulatory Visit (INDEPENDENT_AMBULATORY_CARE_PROVIDER_SITE_OTHER): Payer: PPO

## 2020-07-09 ENCOUNTER — Other Ambulatory Visit: Payer: Self-pay | Admitting: Endocrinology

## 2020-07-09 ENCOUNTER — Other Ambulatory Visit: Payer: Self-pay

## 2020-07-10 LAB — BASIC METABOLIC PANEL
BUN: 23 mg/dL (ref 6–23)
CO2: 28 mEq/L (ref 19–32)
Calcium: 10 mg/dL (ref 8.4–10.5)
Chloride: 104 mEq/L (ref 96–112)
Creatinine, Ser: 0.86 mg/dL (ref 0.40–1.20)
GFR: 67.32 mL/min (ref >60.00)
Glucose, Bld: 85 mg/dL (ref 70–99)
Potassium: 4.7 mEq/L (ref 3.5–5.1)
Sodium: 139 mEq/L (ref 135–145)

## 2020-07-14 ENCOUNTER — Encounter: Payer: Self-pay | Admitting: Endocrinology

## 2020-07-14 ENCOUNTER — Ambulatory Visit: Payer: PPO | Admitting: Endocrinology

## 2020-07-14 ENCOUNTER — Other Ambulatory Visit: Payer: Self-pay

## 2020-07-14 VITALS — BP 144/78 | HR 66 | Ht 60.0 in | Wt 114.5 lb

## 2020-07-14 DIAGNOSIS — I951 Orthostatic hypotension: Secondary | ICD-10-CM | POA: Diagnosis not present

## 2020-07-14 DIAGNOSIS — E2749 Other adrenocortical insufficiency: Secondary | ICD-10-CM

## 2020-07-14 DIAGNOSIS — E213 Hyperparathyroidism, unspecified: Secondary | ICD-10-CM

## 2020-07-14 NOTE — Patient Instructions (Addendum)
Take Florinef M/W F  Bring BP meter

## 2020-07-14 NOTE — Progress Notes (Signed)
Patient ID: Katrina Rivera, female   DOB: 11-Dec-1945, 75 y.o.   MRN: [id number]   Subjective:      Chief complaint: Follow-up of various issues   PROBLEM 1: Dysautonomic orthostatic hypotension   PAST history: She has had long-standing problems with orthostatic hypotension and also hyponatremia. Has been diagnosed with dysautonomia and has multiple other problems related to autonomic neuropathy.   She has been on Florinef since 2004 previously taking 3 tablets daily along with 5 tablets of potassium which had previously controlled her symptoms well . Because of persistent orthostatic symptoms she had been tried on midodrine on 05/28/12 but this was later stopped when blood pressure increased.  She  had medication adjustments done frequently over the last year for regulating her blood pressure.  RECENT history:   She has  been on variable doses of Florinef long-term, as much as 3 a day previously,   Now taking only 1 tablet every other day since 05/2020  She is checking her blood pressure periodically and now she thinks she has an [employer] monitor Although she thinks her blood pressure may be frequently high as she thinks this may be related to stress and anxiety Currently has blood pressure readings in the systolic range between 149 and 168 sitting Standing blood pressures have been as low as 126 systolic and usually lower than sitting blood pressure No recent symptoms of lightheadedness or weakness when getting up from sitting    PROBLEM 2: Hypokalemia previously from high-dose Florinef.  Currently not requiring potassium supplements    Lab Results  Component Value Date   CREATININE 0.86 07/09/2020   BUN 23 07/09/2020   NA 139 07/09/2020   K 4.7 07/09/2020   CL 104 07/09/2020   CO2 28 07/09/2020     Adrenal Insufficiency:   This is secondary to pituitary dysfunction  Prior testing included Cortrosyn test showing stimulated level of 15.7, baseline 3.9.  Also  confirmed by 24 hour urine free cortisol which was only 3.0.  Although previously she had tolerated oral hydrocortisone and small doses with improvement in her symptoms she did not continue this long-term She was again symptomatic with weight loss, decreased appetite, nausea and also diarrhea Cortrosyn stimulation test on 03/12/14 showed baseline cortisol level of 0.3 and post injection of 1.1 only  She had GI side effects from hydrocortisone and prednisone and was in the past using hydrocortisone injections using insulin syringe   RECENT HISTORY:  She feels well on methylprednisolone without any nausea or decreased appetite/weight loss; no nausea compared to when she was on hydrocortisone injections because of GI intolerance with oral supplements   She is taking 4 mg in the early morning and half tablet in the late afternoon Blood pressure readings as above  Sodium and potassium normal  Wt Readings from Last 3 Encounters:  07/14/20 114 lb 8 oz (51.9 kg)  06/01/20 116 lb (52.6 kg)  04/30/20 114 lb (51.7 kg)     General Endocrinology:   HYPERCALCEMIA:  She has had intermittent hypercalcemia since at least 2018, previously only temporarily associated with renal dysfunction Her PTH level was 31 previously and subsequently 23 and 38 1, 25 vitamin D level is normal  However her calcium is variable, generally upper normal  She is not on calcium supplements  She does have osteoporosis as of 2016, taking Prolia from orthopedic surgeon every 6 months She was told by orthopedic surgeon to take vitamin D but not clear what dose she  is taking  Lab Results  Component Value Date   PTH 38 06/01/2020   PTH Comment 06/01/2020   CALCIUM 10.0 07/09/2020   PHOS 1.9 (L) 07/23/2006     Allergies as of 07/14/2020      Reactions   Azithromycin    Other reaction(s): stomach upset   Buspirone Hcl    Other reaction(s): palpitations   Doxycycline Hyclate    Other reaction(s): GI upset    Mirtazapine    Other reaction(s): palpitations      Medication List       Accurate as of July 14, 2020 10:55 AM. If you have any questions, ask your nurse or doctor.        alfuzosin 10 MG 24 hr tablet Commonly known as: UROXATRAL Take 10 mg by mouth daily with breakfast.   cephALEXin 250 MG capsule Commonly known as: KEFLEX 250 mg See admin instructions. Take 1 tablet by mouth every other night at bedtime.   dicyclomine 10 MG capsule Commonly known as: BENTYL Take 10 mg by mouth 4 (four) times daily -  before meals and at bedtime. Take 1 tablet by mouth 30 minutes before meals and at bedtime.   Ferrous Sulfate Dried 200 (65 Fe) MG Tabs Take 65 mg by mouth daily.   fludrocortisone 0.1 MG tablet Commonly known as: FLORINEF Take 1 tablet (0.1 mg total) by mouth daily.   gabapentin 100 MG capsule Commonly known as: Neurontin Take 1-2 tablets as needed for breakthrough pain   Horizant 600 MG Tbcr Generic drug: Gabapentin Enacarbil Take 1 tablet (600 mg total) by mouth at bedtime.   methylPREDNISolone 4 MG tablet Commonly known as: MEDROL TAKE 1 TABLET (4MG ) AT BREAKFAST AND 1/2 TABLET (2MG ) AT 4PM.   mirtazapine 15 MG tablet Commonly known as: REMERON Take 15 mg by mouth at bedtime.   multivitamin tablet Take 1 tablet by mouth daily.   SYSTANE OP Apply 2 drops to eye daily as needed (for dry eyes). For both eyes   TYLENOL 8 HOUR ARTHRITIS PAIN PO Take 1-2 tablets by mouth daily.   VITAMIN B COMPLEX PO Take by mouth.   VITAMIN D3 PO Take 1 tablet by mouth daily.       Review of Systems    Genitourinary: She has had difficulty emptying her bladder and is being treated by urologist   OSTEOPOROSIS: This is being treated by her orthopedic surgeon with Prolia  Previous bone density in 2016 showed the following: AP LUMBAR SPINE L1 through L4 Young Adult T-Score:  -2.4 LEFT FEMUR NECK: Young Adult T-Score: -3.1  Last bone density in 12/20 showed  improvement in her T-scores up to -2.7 at the right femur and 3.6radius Previously in 12/2016 has T score of -3.3 at the dual femur neck right and -2.3 for the L1-L3  Vitamin D deficiency: She is on 1000 units vitamin D3     Objective:   Physical Exam  BP (!) 144/78 (BP Location: Left Arm, Patient Position: Standing, Cuff Size: Normal)   Pulse 66   Ht 5' (1.524 m)   Wt 114 lb 8 oz (51.9 kg)   SpO2 98%   BMI 22.36 kg/m   No ankle edema Have swelling of the left knee  Assessment:        Orthostatic dysautonomic hypotension syndrome:   Her orthostatic hypotension has been consistently well controlled Since 4/25 has been taking 0.1 mg Florinef every other day As above blood pressure in the office is fairly  good although relatively higher it at home especially sitting Has no other evidence of autonomic dysfunction systemically  Potassium normal  Adrenal insufficiency:  She has long-standing secondary adrenal insufficiency with fairly typical symptoms at baseline and very low cortisol levels on the stimulation test Symptoms consistently well controlled on methylprednisolone 4 mg in the morning and 2 mg in the evening She will continue the same doses    Hypercalcemia: Calcium is upper normal and now her parathyroid hormone level is relatively higher at 38 Likely has hyperparathyroidism  She is being treated for osteoporosis with Prolia by her orthopedic specialist    Plan:       Reduce Florinef to 3 days a week   Follow-up in 4 months unless having any new symptoms She will bring her blood pressure monitor for comparison  Clearance for orthopedic surgery has been approved and letter sent today by fax  Reather Littler  Note: This office note was prepared with Dragon voice recognition system technology. Any transcriptional errors that result from this process are unintentional.

## 2020-07-17 ENCOUNTER — Ambulatory Visit: Payer: PPO | Admitting: Endocrinology

## 2020-08-04 DIAGNOSIS — Z1231 Encounter for screening mammogram for malignant neoplasm of breast: Secondary | ICD-10-CM | POA: Diagnosis not present

## 2020-08-04 DIAGNOSIS — Z01419 Encounter for gynecological examination (general) (routine) without abnormal findings: Secondary | ICD-10-CM | POA: Diagnosis not present

## 2020-08-11 ENCOUNTER — Encounter: Payer: Self-pay | Admitting: Orthopedic Surgery

## 2020-08-11 ENCOUNTER — Telehealth: Payer: Self-pay | Admitting: Endocrinology

## 2020-08-11 DIAGNOSIS — M1712 Unilateral primary osteoarthritis, left knee: Secondary | ICD-10-CM

## 2020-08-11 DIAGNOSIS — N952 Postmenopausal atrophic vaginitis: Secondary | ICD-10-CM | POA: Insufficient documentation

## 2020-08-11 DIAGNOSIS — N951 Menopausal and female climacteric states: Secondary | ICD-10-CM | POA: Insufficient documentation

## 2020-08-11 HISTORY — DX: Unilateral primary osteoarthritis, left knee: M17.12

## 2020-08-11 NOTE — Telephone Encounter (Signed)
Patient called to advise that via note to Dr Thurston Hole from Dr Lucianne Muss she is supposed to change her dosage for Methylprednisolone before her knee surgery.    Patient requesting a call back to advise how to change dosage and needs to for RX to reflect new dosage.  Call back [phone]

## 2020-08-11 NOTE — Progress Notes (Signed)
DUE TO COVID-19 ONLY ONE VISITOR IS ALLOWED TO COME WITH YOU AND STAY IN THE WAITING ROOM ONLY DURING PRE OP AND PROCEDURE DAY OF SURGERY. THE 1 VISITOR  MAY VISIT WITH YOU AFTER SURGERY IN YOUR PRIVATE ROOM DURING VISITING HOURS ONLY!  YOU NEED TO HAVE A COVID 19 TEST ON___7/14/2022 ____ @_______ , THIS TEST MUST BE DONE BEFORE SURGERY,  COVID TESTING SITE [address] [place] , IT IS ON THE RIGHT GOING OUT [address] APPROXIMATELY  2 MINUTES PAST [employer] ON THE RIGHT. ONCE YOUR COVID TEST IS COMPLETED,  PLEASE BEGIN THE QUARANTINE INSTRUCTIONS AS OUTLINED IN YOUR HANDOUT.                MAANSI WIKE  08/11/2020   Your procedure is scheduled on:                 08/24/2020   Report  to  o [facility] Main  Entrance   Report to admitting at    [phone]     Call this number if you have problems the morning of surgery [phone]    REMEMBER: NO  SOLID FOOD CANDY OR GUM AFTER MIDNIGHT. CLEAR LIQUIDS UNTIL  0415am        . NOTHING BY MOUTH EXCEPT CLEAR LIQUIDS UNTIL    0415am    . PLEASE FINISH ENSURE DRINK PER SURGEON ORDER  WHICH NEEDS TO BE COMPLETED AT    0415am   .      CLEAR LIQUID DIET   Foods Allowed                                                                    Coffee and tea, regular and decaf                            Fruit ices (not with fruit pulp)                                      Iced Popsicles                                    Carbonated beverages, regular and diet                                    Cranberry, grape and apple juices Sports drinks like Gatorade Lightly seasoned clear broth or consume(fat free) Sugar, honey syrup ___________________________________________________________________      BRUSH YOUR TEETH MORNING OF SURGERY AND RINSE YOUR MOUTH OUT, NO CHEWING GUM CANDY OR MINTS.     Take these medicines the morning of surgery with A SIP OF WATER: uroxatral   DO NOT TAKE ANY DIABETIC  MEDICATIONS DAY OF YOUR SURGERY                               You may not have any metal on your body  including hair pins and              piercings  Do not wear jewelry, make-up, lotions, powders or perfumes, deodorant             Do not wear nail polish on your fingernails.  Do not shave  48 hours prior to surgery.              Men may shave face and neck.   Do not bring valuables to the hospital. [facility] IS NOT             RESPONSIBLE   FOR VALUABLES.  Contacts, dentures or bridgework may not be worn into surgery.  Leave suitcase in the car. After surgery it may be brought to your room.     Patients discharged the day of surgery will not be allowed to drive home. IF YOU ARE HAVING SURGERY AND GOING HOME THE SAME DAY, YOU MUST HAVE AN ADULT TO DRIVE YOU HOME AND BE WITH YOU FOR 24 HOURS. YOU MAY GO HOME BY TAXI OR UBER OR ORTHERWISE, BUT AN ADULT MUST ACCOMPANY YOU HOME AND STAY WITH YOU FOR 24 HOURS.  Name and phone number of your driver:  Special Instructions: N/A              Please read over the following fact sheets you were given: _____________________________________________________________________  [facility] - Preparing for Surgery Before surgery, you can play an important role.  Because skin is not sterile, your skin needs to be as free of germs as possible.  You can reduce the number of germs on your skin by washing with CHG (chlorahexidine gluconate) soap before surgery.  CHG is an antiseptic cleaner which kills germs and bonds with the skin to continue killing germs even after washing. Please DO NOT use if you have an allergy to CHG or antibacterial soaps.  If your skin becomes reddened/irritated stop using the CHG and inform your nurse when you arrive at Short Stay. Do not shave (including legs and underarms) for at least 48 hours prior to the first CHG shower.  You may shave your face/neck. Please follow these instructions carefully:  1.  Shower with CHG Soap the night  before surgery and the  morning of Surgery.  2.  If you choose to wash your hair, wash your hair first as usual with your  normal  shampoo.  3.  After you shampoo, rinse your hair and body thoroughly to remove the  shampoo.                           4.  Use CHG as you would any other liquid soap.  You can apply chg directly  to the skin and wash                       Gently with a scrungie or clean washcloth.  5.  Apply the CHG Soap to your body ONLY FROM THE NECK DOWN.   Do not use on face/ open                           Wound or open sores. Avoid contact with eyes, ears mouth and genitals (private parts).                       Wash face,  Medical illustrator (private parts)  with your normal soap.             6.  Wash thoroughly, paying special attention to the area where your surgery  will be performed.  7.  Thoroughly rinse your body with warm water from the neck down.  8.  DO NOT shower/wash with your normal soap after using and rinsing off  the CHG Soap.                9.  Pat yourself dry with a clean towel.            10.  Wear clean pajamas.            11.  Place clean sheets on your bed the night of your first shower and do not  sleep with pets. Day of Surgery : Do not apply any lotions/deodorants the morning of surgery.  Please wear clean clothes to the hospital/surgery center.  FAILURE TO FOLLOW THESE INSTRUCTIONS MAY RESULT IN THE CANCELLATION OF YOUR SURGERY PATIENT SIGNATURE_________________________________  NURSE SIGNATURE__________________________________  ________________________________________________________________________

## 2020-08-11 NOTE — H&P (Addendum)
TOTAL KNEE ADMISSION H&P  Patient is being admitted for left total knee arthroplasty.  Subjective:  Chief Complaint:left knee pain.  HPI: Katrina Rivera, 75 y.o. female, has a history of pain and functional disability in the left knee due to arthritis and has failed non-surgical conservative treatments for greater than 12 weeks to includeNSAID's and/or analgesics, corticosteriod injections, viscosupplementation injections, flexibility and strengthening excercises, supervised PT with diminished ADL's post treatment, use of assistive devices, weight reduction as appropriate, and activity modification.  Onset of symptoms was gradual, starting >10 years ago with gradually worsening course since that time. The patient noted no past surgery on the left knee(s).  Patient currently rates pain in the left knee(s) at 10 out of 10 with activity. Patient has night pain, worsening of pain with activity and weight bearing, pain that interferes with activities of daily living, crepitus, and joint swelling.  Patient has evidence of subchondral sclerosis, periarticular osteophytes, and joint space narrowing by imaging studies. There is no active infection.  Patient Active Problem List   Diagnosis Date Noted   Primary localized osteoarthritis of left knee 08/11/2020   Atrophic vaginitis 08/11/2020   Menopausal symptom 08/11/2020   Restless leg syndrome    Chronic back pain    Low back pain without sciatica 11/01/2018   Essential hypertension    SBO (small bowel obstruction) (HCC) 09/01/2016   Fibromyalgia    Abdominal pain, vomiting, and diarrhea    Dehydration    Somnolence, daytime 10/22/2015   Fatigue 10/22/2015   Chronic leg pain 06/29/2015   S/P lumbar spinal fusion 06/18/2014   Abnormality of gait 02/18/2013   S/P lumbar microdiscectomy 01/24/2013   Hypokalemia 12/24/2012   Sweating abnormality 10/19/2012   Dysautonomia orthostatic hypotension syndrome 08/16/2012   Hereditary and idiopathic  peripheral neuropathy 08/16/2012   Chronic adrenal insufficiency (HCC) 08/16/2012   MS (multiple sclerosis) (HCC) 08/15/2012   Past Medical History:  Diagnosis Date   Addison's disease (HCC)    takes Solu Cortef daily   Anemia    takes Ferrous Sulfate daily   Anxiety    takes Xanax nightly   Arthritis    Chronic back pain    stenosis   Depression    takes Cymbalta daily   Dizziness    if b/p drops    Fibromyalgia    Fibromyalgia    GERD (gastroesophageal reflux disease)    Headache(784.0)    History of blood transfusion    no abnormal reaction noted   History of bronchitis    many yrs ago    Hypokalemia    takes Potassium daily   Hypotension    Hypotension    takes Florinef daily   IBS (irritable bowel syndrome)    takes Align daily   Insomnia    takes Trazodone nightly   Joint pain    Multiple sclerosis (HCC)    doesn't take any meds   Multiple sclerosis (HCC)    Nocturia    Osteoporosis    Peripheral neuropathy    Primary localized osteoarthritis of left knee 08/11/2020   Restless leg syndrome    Seasonal allergies    takes Zyrtec daily;uses Flonase daily as needed   Syncope    Urinary frequency    takes Flomax daily   Weakness    numbness and tingling    Past Surgical History:  Procedure Laterality Date   ABDOMINAL HYSTERECTOMY  1988   APPENDECTOMY  1988   CESAREAN SECTION  1973/1977   x2  CHOLECYSTECTOMY  1997   COLECTOMY  1990   COLONOSCOPY     ESOPHAGOGASTRODUODENOSCOPY     EYE SURGERY     bilateral - /w IOL   FRACTURE SURGERY Right    rods and screws   LUMBAR LAMINECTOMY/DECOMPRESSION MICRODISCECTOMY Left 01/24/2013   Procedure: LUMBAR FIVE TO SACRAL ONE LUMBAR LAMINECTOMY/DECOMPRESSION MICRODISCECTOMY 1 LEVEL;  Surgeon: Tia Alert, MD;  Location: [facility] NEURO ORS;  Service: Neurosurgery;  Laterality: Left;   MAXIMUM ACCESS (MAS)POSTERIOR LUMBAR INTERBODY FUSION (PLIF) 1 LEVEL N/A 06/18/2014   Procedure: MAXIMUM ACCESS SURGERY POSTERIOR LUMBAR  INTERBODY FUSION LUMBAR FIVE TO SACRAL ONE ;  Surgeon: Tia Alert, MD;  Location: [facility] NEURO ORS;  Service: Neurosurgery;  Laterality: N/A;    No current facility-administered medications for this encounter.   Current Outpatient Medications  Medication Sig Dispense Refill Last Dose   acetaminophen (TYLENOL) 500 MG tablet Take 1,000 mg by mouth at bedtime.      acetaminophen (TYLENOL) 650 MG CR tablet Take 1,300 mg by mouth in the morning.      acidophilus (RISAQUAD) CAPS capsule Take 1 capsule by mouth in the morning.      alfuzosin (UROXATRAL) 10 MG 24 hr tablet Take 10 mg by mouth daily with breakfast.      Ascorbic Acid (VITAMIN C PO) Take 1 tablet by mouth daily.      B Complex Vitamins (VITAMIN B COMPLEX PO) Take 1 capsule by mouth in the morning.      cephALEXin (KEFLEX) 250 MG capsule Take 250 mg by mouth every other day.      Cholecalciferol (VITAMIN D3 PO) Take 1 tablet by mouth in the morning.      CRANBERRY PO Take 2 tablets by mouth in the morning.      dicyclomine (BENTYL) 10 MG capsule Take 10 mg by mouth at bedtime.      Ferrous Sulfate Dried 200 (65 FE) MG TABS Take 65 mg by mouth in the morning.      fludrocortisone (FLORINEF) 0.1 MG tablet Take 1 tablet (0.1 mg total) by mouth daily. (Patient taking differently: Take 0.1 mg by mouth See admin instructions. Take 0.1 mg by mouth on Tuesday, Thursday and Saturday) 90 tablet 1    fluticasone (FLONASE) 50 MCG/ACT nasal spray Place 1 spray into both nostrils daily as needed for allergies or rhinitis.      gabapentin (NEURONTIN) 100 MG capsule Take 1-2 tablets as needed for breakthrough pain (Patient taking differently: Take 100 mg by mouth at bedtime.) 60 capsule 3    Gabapentin Enacarbil (HORIZANT) 600 MG TBCR Take 1 tablet (600 mg total) by mouth at bedtime. 90 tablet 3    methylPREDNISolone (MEDROL) 4 MG tablet TAKE 1 TABLET (4MG ) AT BREAKFAST AND 1/2 TABLET (2MG ) AT 4PM. (Patient taking differently: Take 2-4 mg by mouth See  admin instructions. TAKE 4 MG BY MOUTH AT BREAKFAST AND 2 MG AT 5 PM.) 135 tablet 2    mirtazapine (REMERON) 15 MG tablet Take 15 mg by mouth at bedtime.      Multiple Vitamin (MULTIVITAMIN) tablet Take 1 tablet by mouth in the morning.      OVER THE COUNTER MEDICATION Take 1 capsule by mouth at bedtime. Sleep 3      Polyethyl Glycol-Propyl Glycol (SYSTANE OP) Place 2 drops into both eyes daily as needed (for dry eyes).      Allergies  Allergen Reactions   Aspirin     bruising   Azithromycin  Other reaction(s): stomach upset   Doxycycline Hyclate     Other reaction(s): GI upset   Buspirone Hcl Palpitations   Mirtazapine Palpitations    Social History   Tobacco Use   Smoking status: Never   Smokeless tobacco: Never  Substance Use Topics   Alcohol use: No    Family History  Problem Relation Age of Onset   Hypertension Mother    Stroke Mother    Heart attack Father    Tremor Brother      Review of Systems  Constitutional:  Positive for activity change.  HENT: Negative.    Eyes: Negative.   Respiratory: Negative.    Cardiovascular: Negative.   Gastrointestinal: Negative.   Endocrine: Negative.   Genitourinary: Negative.   Musculoskeletal:  Positive for arthralgias, back pain, gait problem, joint swelling and myalgias.  Skin: Negative.   Allergic/Immunologic: Negative.   Hematological: Negative.   Psychiatric/Behavioral: Negative.     Objective:  Physical Exam Constitutional:      Appearance: Normal appearance. She is normal weight.  HENT:     Head: Normocephalic and atraumatic.     Right Ear: External ear normal.     Left Ear: External ear normal.     Nose: Nose normal.     Mouth/Throat:     Mouth: Mucous membranes are moist.  Eyes:     Conjunctiva/sclera: Conjunctivae normal.  Cardiovascular:     Rate and Rhythm: Normal rate and regular rhythm.  Pulmonary:     Effort: Pulmonary effort is normal.     Breath sounds: Normal breath sounds.  Abdominal:      General: Bowel sounds are normal.     Palpations: Abdomen is soft.  Genitourinary:    Comments: Not pertinent to current symptomatology therefore not examined.  Musculoskeletal:     Cervical back: Neck supple.     Comments: Examination of her left knee reveals pain medially and laterally.  1+ crepitation.  1+ synovitis.  Range of motion 0-120 degrees.  Knee is stable with normal patella tracking.  Bilateral AFO for foot drop  Skin:    General: Skin is warm and dry.     Capillary Refill: Capillary refill takes less than 2 seconds.  Neurological:     Mental Status: She is alert.     Motor: Weakness present.     Gait: Gait abnormal.  Psychiatric:        Mood and Affect: Mood normal.        Behavior: Behavior normal.    Vital signs in last 24 hours: Temp:  [97.4 F (36.3 C)] 97.4 F (36.3 C) (07/05 1200) Pulse Rate:  [92] 92 (07/05 1200) BP: (156)/(83) 156/83 (07/05 1200) SpO2:  [97 %] 97 % (07/05 1200) Weight:  [52 kg] 52 kg (07/05 1200)  Labs:   Estimated body mass index is 22.38 kg/m as calculated from the following:   Height as of this encounter: 5' (1.524 m).   Weight as of this encounter: 52 kg.   Imaging Review Plain radiographs demonstrate moderate degenerative joint disease of the left knee(s). The overall alignment ismild varus. The bone quality appears to be good for age and reported activity level. Patient had a CT scan as she cannot have an MRI    CT of the knee showed full thickness cartilage loss in medial compartment, partial thickness cartilage loss laterally       Assessment/Plan:  End stage arthritis, left knee  Principal Problem:   Primary localized osteoarthritis of left  knee Active Problems:   MS (multiple sclerosis) (HCC)   Dysautonomia orthostatic hypotension syndrome   Hereditary and idiopathic peripheral neuropathy   Chronic adrenal insufficiency (HCC)   Abnormality of gait   Chronic leg pain   Fatigue   Fibromyalgia   Dehydration    Essential hypertension   Restless leg syndrome   Chronic back pain   Anesthesia to give stress dose steroids for patient perioperatively and patient to double home steroid dose for the first 48 hours home.    The patient history, physical examination, clinical judgment of the provider and imaging studies are consistent with end stage degenerative joint disease of the left knee(s) and total knee arthroplasty is deemed medically necessary. The treatment options including medical management, injection therapy arthroscopy and arthroplasty were discussed at length. The risks and benefits of total knee arthroplasty were presented and reviewed. The risks due to aseptic loosening, infection, stiffness, patella tracking problems, thromboembolic complications and other imponderables were discussed. The patient acknowledged the explanation, agreed to proceed with the plan and consent was signed. Patient is being admitted for inpatient treatment for surgery, pain control, PT, OT, prophylactic antibiotics, VTE prophylaxis, progressive ambulation and ADL's and discharge planning. The patient is planning to be discharged same day to  home with outpatient physical therapy 10am at Dr Sherene Sires office on 7/20 and follow up with Dr Thurston Hole on 7/28 at 11am

## 2020-08-13 ENCOUNTER — Telehealth: Payer: Self-pay | Admitting: Endocrinology

## 2020-08-13 ENCOUNTER — Encounter (HOSPITAL_COMMUNITY): Payer: Self-pay

## 2020-08-13 ENCOUNTER — Other Ambulatory Visit: Payer: Self-pay

## 2020-08-13 ENCOUNTER — Encounter (HOSPITAL_COMMUNITY)
Admission: RE | Admit: 2020-08-13 | Discharge: 2020-08-13 | Disposition: A | Discharge status: Home / Self Care | Payer: PPO | Source: Ambulatory Visit | Attending: Orthopedic Surgery | Admitting: Orthopedic Surgery

## 2020-08-13 DIAGNOSIS — Z01818 Encounter for other preprocedural examination: Secondary | ICD-10-CM | POA: Diagnosis not present

## 2020-08-13 LAB — CBC WITH DIFFERENTIAL/PLATELET
Abs Immature Granulocytes: 0.07 10*3/uL (ref 0.00–0.07)
Basophils Absolute: 0 10*3/uL (ref 0.0–0.1)
Basophils Relative: 0 %
Eosinophils Absolute: 0 10*3/uL (ref 0.0–0.5)
Eosinophils Relative: 0 %
HCT: 40.4 % (ref 36.0–46.0)
Hemoglobin: 12.9 g/dL (ref 12.0–15.0)
Immature Granulocytes: 1 %
Lymphocytes Relative: 23 %
Lymphs Abs: 2 10*3/uL (ref 0.7–4.0)
MCH: 31.6 pg (ref 26.0–34.0)
MCHC: 31.9 g/dL (ref 30.0–36.0)
MCV: 99 fL (ref 80.0–100.0)
Monocytes Absolute: 0.5 10*3/uL (ref 0.1–1.0)
Monocytes Relative: 5 %
Neutro Abs: 6.4 10*3/uL (ref 1.7–7.7)
Neutrophils Relative %: 71 %
Platelets: 247 10*3/uL (ref 150–400)
RBC: 4.08 MIL/uL (ref 3.87–5.11)
RDW: 12.7 % (ref 11.5–15.5)
WBC: 9 10*3/uL (ref 4.0–10.5)
nRBC: 0 % (ref 0.0–0.2)

## 2020-08-13 LAB — COMPREHENSIVE METABOLIC PANEL
ALT: 23 U/L (ref 0–44)
AST: 31 U/L (ref 15–41)
Albumin: 4.5 g/dL (ref 3.5–5.0)
Alkaline Phosphatase: 30 U/L — ABNORMAL LOW (ref 38–126)
Anion gap: 9 (ref 5–15)
BUN: 24 mg/dL — ABNORMAL HIGH (ref 8–23)
CO2: 26 mmol/L (ref 22–32)
Calcium: 10.1 mg/dL (ref 8.9–10.3)
Chloride: 99 mmol/L (ref 98–111)
Creatinine, Ser: 0.83 mg/dL (ref 0.44–1.00)
GFR, Estimated: >60 mL/min (ref >60)
Glucose, Bld: 105 mg/dL — ABNORMAL HIGH (ref 70–99)
Potassium: 4.1 mmol/L (ref 3.5–5.1)
Sodium: 134 mmol/L — ABNORMAL LOW (ref 135–145)
Total Bilirubin: 0.9 mg/dL (ref 0.3–1.2)
Total Protein: 7.3 g/dL (ref 6.5–8.1)

## 2020-08-13 LAB — SURGICAL PCR SCREEN
MRSA, PCR: NEGATIVE
Staphylococcus aureus: NEGATIVE

## 2020-08-13 NOTE — Progress Notes (Signed)
Anesthesia Review:  PCP: DR Lucianne Muss  Cardiologist : Chest x-ray : 04/26/20  EKG :08/13/20  Echo : Stress test: Cardiac Cath :  Activity level:  cannot do a flight of stairs without difficulty  Sleep Study/ CPAP : none  Fasting Blood Sugar :      / Checks Blood Sugar -- times a day:   Blood Thinner/ Instructions /Last Dose: ASA / Instructions/ Last Dose :   Pt has MS and fine hand tremors.  PT with hx of hypotension taking Florinef on Tues,, Thurs,Sat.  At time of preop appt on 08/13/20 initial blood pressure in left arm was 199/132.   REcheck of blood pressures in right and left arm using small cuff was right- 197/82 and left arm- 198/77..  PT denies any chest pain , dizziness or shortness of breath or headache.  Wrote down for pt blood pressures of above and for her to follow up with DR Lucianne Muss, PCP.  PT voiced understanding  EKg done shows NSR.  Jeanette Caprice made aware of above and no new orders given.

## 2020-08-13 NOTE — Telephone Encounter (Signed)
Please advise 

## 2020-08-13 NOTE — Telephone Encounter (Signed)
Patient came by the office and gave me a sticky note to give to Dr Lucianne Muss (its in his box) with some information she wanted him to have regarding her blood pressure. She said her BP is high and her surgeon wanted Dr Lucianne Muss to know. She has her knee surgery on 08/24/20 and did not want her BP to be too high for that, she asked about stopping BP medication. Please advise. Ph# [phone]

## 2020-08-14 ENCOUNTER — Encounter (HOSPITAL_COMMUNITY): Payer: Self-pay | Admitting: Orthopedic Surgery

## 2020-08-14 ENCOUNTER — Ambulatory Visit (INDEPENDENT_AMBULATORY_CARE_PROVIDER_SITE_OTHER): Payer: PPO | Admitting: Endocrinology

## 2020-08-14 ENCOUNTER — Encounter: Payer: Self-pay | Admitting: Endocrinology

## 2020-08-14 VITALS — BP 140/66 | HR 93 | Ht 60.0 in | Wt 116.0 lb

## 2020-08-14 DIAGNOSIS — E213 Hyperparathyroidism, unspecified: Secondary | ICD-10-CM

## 2020-08-14 LAB — URINE CULTURE: Culture: NO GROWTH

## 2020-08-14 NOTE — Progress Notes (Signed)
Anesthesia Chart Review:   Case: [id number] Date/Time: 08/24/20 0700   Procedure: TOTAL KNEE ARTHROPLASTY (Left: Knee)   Anesthesia type: General   Pre-op diagnosis: DJD LT KNEE   Location: [facility] ROOM 07 / [facility]   Surgeons: Salvatore Marvel, MD       DISCUSSION: Pt is 75 years old with hx hypotension (takes florineff), MS, Addison's disease  BP elevated 7/7/220at presurgical testing (197/82, 198/77, 199/132). Instructed pt to f/u with endocrinologist for BP management, potential adjustment of florineff. At Dr. Remus Blake office 08/14/20, BP 140/66  VS: BP (!) 197/82   Pulse 76   Temp 36.8 C (Oral)   Resp 16   Ht 5' (1.524 m)   Wt 52.6 kg   SpO2 99%   BMI 22.65 kg/m   PROVIDERS: - PCP is [name], MD - Endocrinologist is Reather Littler, MD   LABS: Labs reviewed: Acceptable for surgery. (all labs ordered are listed, but only abnormal results are displayed)  Labs Reviewed  COMPREHENSIVE METABOLIC PANEL - Abnormal; Notable for the following components:      Result Value   Sodium 134 (*)    Glucose, Bld 105 (*)    BUN 24 (*)    Alkaline Phosphatase 30 (*)    All other components within normal limits  SURGICAL PCR SCREEN  URINE CULTURE  CBC WITH DIFFERENTIAL/PLATELET     IMAGES: CXR 04/16/20:  - Lungs clear. Cardiac silhouette normal.  - Thoracic stimulator leads in midthoracic region.  - Aortic Atherosclerosis   EKG 08/13/20: NSR. Possible Left atrial enlargement   CV: N/A  Past Medical History:  Diagnosis Date   Addison's disease (HCC)    takes Solu Cortef daily   Anemia    takes Ferrous Sulfate daily   Anxiety    takes Xanax nightly   Arthritis    Chronic back pain    stenosis   Depression    takes Cymbalta daily   Dizziness    if b/p drops    Fibromyalgia    Fibromyalgia    History of blood transfusion    no abnormal reaction noted   History of bronchitis    many yrs ago    Hypokalemia    takes Potassium daily   Hypotension    Hypotension     takes Florinef daily   IBS (irritable bowel syndrome)    takes Align daily   Insomnia    takes Trazodone nightly   Joint pain    Multiple sclerosis (HCC)    doesn't take any meds   Multiple sclerosis (HCC)    Nocturia    Osteoporosis    Peripheral neuropathy    Primary localized osteoarthritis of left knee 08/11/2020   Restless leg syndrome    Seasonal allergies    takes Zyrtec daily;uses Flonase daily as needed   Syncope    Urinary frequency    takes Flomax daily   Weakness    numbness and tingling    Past Surgical History:  Procedure Laterality Date   ABDOMINAL HYSTERECTOMY  1988   APPENDECTOMY  1988   CESAREAN SECTION  1973/1977   x2   CHOLECYSTECTOMY  1997   COLECTOMY  1990   COLONOSCOPY     ESOPHAGOGASTRODUODENOSCOPY     EYE SURGERY     bilateral - /w IOL   FRACTURE SURGERY Right    rods and screws   LUMBAR LAMINECTOMY/DECOMPRESSION MICRODISCECTOMY Left 01/24/2013   Procedure: LUMBAR FIVE TO SACRAL ONE LUMBAR LAMINECTOMY/DECOMPRESSION MICRODISCECTOMY 1 LEVEL;  Surgeon: Tia Alert, MD;  Location: [facility];  Service: Neurosurgery;  Laterality: Left;   MAXIMUM ACCESS (MAS)POSTERIOR LUMBAR INTERBODY FUSION (PLIF) 1 LEVEL N/A 06/18/2014   Procedure: MAXIMUM ACCESS SURGERY POSTERIOR LUMBAR INTERBODY FUSION LUMBAR FIVE TO SACRAL ONE ;  Surgeon: Tia Alert, MD;  Location: [facility];  Service: Neurosurgery;  Laterality: N/A;    MEDICATIONS:  acetaminophen (TYLENOL) 500 MG tablet   acetaminophen (TYLENOL) 650 MG CR tablet   acidophilus (RISAQUAD) CAPS capsule   alfuzosin (UROXATRAL) 10 MG 24 hr tablet   Ascorbic Acid (VITAMIN C PO)   B Complex Vitamins (VITAMIN B COMPLEX PO)   cephALEXin (KEFLEX) 250 MG capsule   Cholecalciferol (VITAMIN D3 PO)   CRANBERRY PO   dicyclomine (BENTYL) 10 MG capsule   Ferrous Sulfate Dried 200 (65 FE) MG TABS   fludrocortisone (FLORINEF) 0.1 MG tablet   fluticasone (FLONASE) 50 MCG/ACT nasal spray   gabapentin (NEURONTIN)  100 MG capsule   Gabapentin Enacarbil (HORIZANT) 600 MG TBCR   methylPREDNISolone (MEDROL) 4 MG tablet   mirtazapine (REMERON) 15 MG tablet   Multiple Vitamin (MULTIVITAMIN) tablet   OVER THE COUNTER MEDICATION   Polyethyl Glycol-Propyl Glycol (SYSTANE OP)   No current facility-administered medications for this encounter.    If no changes, I anticipate pt can proceed with surgery as scheduled.   Rica Mast, PhD, FNP-BC [facility]/Anesthesiology Phone: [phone] 08/14/2020 12:40 PM

## 2020-08-14 NOTE — Progress Notes (Signed)
Urine culture done 7/7/022- no growth.

## 2020-08-14 NOTE — Progress Notes (Signed)
Established Patient Office Visit  Subjective:  Patient ID: Katrina Rivera, female    DOB: 10/19/1945  Age: 75 y.o. MRN: [id number]  CC: No chief complaint on file.   HPI Katrina Rivera presents for b/p check   Past Medical History:  Diagnosis Date   Addison's disease (HCC)    takes Solu Cortef daily   Anemia    takes Ferrous Sulfate daily   Anxiety    takes Xanax nightly   Arthritis    Chronic back pain    stenosis   Depression    takes Cymbalta daily   Dizziness    if b/p drops    Fibromyalgia    Fibromyalgia    History of blood transfusion    no abnormal reaction noted   History of bronchitis    many yrs ago    Hypokalemia    takes Potassium daily   Hypotension    Hypotension    takes Florinef daily   IBS (irritable bowel syndrome)    takes Align daily   Insomnia    takes Trazodone nightly   Joint pain    Multiple sclerosis (HCC)    doesn't take any meds   Multiple sclerosis (HCC)    Nocturia    Osteoporosis    Peripheral neuropathy    Primary localized osteoarthritis of left knee 08/11/2020   Restless leg syndrome    Seasonal allergies    takes Zyrtec daily;uses Flonase daily as needed   Syncope    Urinary frequency    takes Flomax daily   Weakness    numbness and tingling    Past Surgical History:  Procedure Laterality Date   ABDOMINAL HYSTERECTOMY  1988   APPENDECTOMY  1988   CESAREAN SECTION  1973/1977   x2   CHOLECYSTECTOMY  1997   COLECTOMY  1990   COLONOSCOPY     ESOPHAGOGASTRODUODENOSCOPY     EYE SURGERY     bilateral - /w IOL   FRACTURE SURGERY Right    rods and screws   LUMBAR LAMINECTOMY/DECOMPRESSION MICRODISCECTOMY Left 01/24/2013   Procedure: LUMBAR FIVE TO SACRAL ONE LUMBAR LAMINECTOMY/DECOMPRESSION MICRODISCECTOMY 1 LEVEL;  Surgeon: Tia Alert, MD;  Location: [facility];  Service: Neurosurgery;  Laterality: Left;   MAXIMUM ACCESS (MAS)POSTERIOR LUMBAR INTERBODY FUSION (PLIF) 1 LEVEL N/A 06/18/2014   Procedure:  MAXIMUM ACCESS SURGERY POSTERIOR LUMBAR INTERBODY FUSION LUMBAR FIVE TO SACRAL ONE ;  Surgeon: Tia Alert, MD;  Location: [facility];  Service: Neurosurgery;  Laterality: N/A;    Family History  Problem Relation Age of Onset   Hypertension Mother    Stroke Mother    Heart attack Father    Tremor Brother     Social History   Socioeconomic History   Marital status: Married    Spouse name: [name]   Number of children: 2   Years of education: 12   Highest education level: Not on file  Occupational History    Employer: DISABLED    Comment: Disabled  Tobacco Use   Smoking status: Never   Smokeless tobacco: Never  Vaping Use   Vaping Use: Never used  Substance and Sexual Activity   Alcohol use: Never   Drug use: Never   Sexual activity: Not on file  Other Topics Concern   Not on file  Social History Narrative   Pt lives at home with spouse. Katrina Rivera) [phone] (patient's cell)   [name]'s cell  [phone]      Caffeine  Use: 1 cups daily.Right handed.Disabled.Education - high schoolPatient has two adult children.   Social Determinants of Health   Financial Resource Strain: Not on file  Food Insecurity: Not on file  Transportation Needs: Not on file  Physical Activity: Not on file  Stress: Not on file  Social Connections: Not on file  Intimate Partner Violence: Not on file    Outpatient Medications Prior to Visit  Medication Sig Dispense Refill   acetaminophen (TYLENOL) 500 MG tablet Take 1,000 mg by mouth at bedtime.     acetaminophen (TYLENOL) 650 MG CR tablet Take 1,300 mg by mouth in the morning.     acidophilus (RISAQUAD) CAPS capsule Take 1 capsule by mouth in the morning.     alfuzosin (UROXATRAL) 10 MG 24 hr tablet Take 10 mg by mouth daily with breakfast.     Ascorbic Acid (VITAMIN C PO) Take 1 tablet by mouth daily.     B Complex Vitamins (VITAMIN B COMPLEX PO) Take 1 capsule by mouth in the morning.     cephALEXin (KEFLEX) 250 MG capsule Take 250 mg by mouth  every other day.     Cholecalciferol (VITAMIN D3 PO) Take 1 tablet by mouth in the morning.     CRANBERRY PO Take 2 tablets by mouth in the morning.     dicyclomine (BENTYL) 10 MG capsule Take 10 mg by mouth at bedtime.     Ferrous Sulfate Dried 200 (65 FE) MG TABS Take 65 mg by mouth in the morning.     fludrocortisone (FLORINEF) 0.1 MG tablet Take 1 tablet (0.1 mg total) by mouth daily. (Patient taking differently: Take 0.1 mg by mouth See admin instructions. Take 0.1 mg by mouth on Tuesday, Thursday and Saturday) 90 tablet 1   fluticasone (FLONASE) 50 MCG/ACT nasal spray Place 1 spray into both nostrils daily as needed for allergies or rhinitis.     gabapentin (NEURONTIN) 100 MG capsule Take 1-2 tablets as needed for breakthrough pain (Patient taking differently: Take 100 mg by mouth at bedtime.) 60 capsule 3   Gabapentin Enacarbil (HORIZANT) 600 MG TBCR Take 1 tablet (600 mg total) by mouth at bedtime. 90 tablet 3   methylPREDNISolone (MEDROL) 4 MG tablet TAKE 1 TABLET (4MG ) AT BREAKFAST AND 1/2 TABLET (2MG ) AT 4PM. (Patient taking differently: Take 2-4 mg by mouth See admin instructions. TAKE 4 MG BY MOUTH AT BREAKFAST AND 2 MG AT 5 PM.) 135 tablet 2   mirtazapine (REMERON) 15 MG tablet Take 15 mg by mouth at bedtime.     Multiple Vitamin (MULTIVITAMIN) tablet Take 1 tablet by mouth in the morning.     OVER THE COUNTER MEDICATION Take 1 capsule by mouth at bedtime. Sleep 3     Polyethyl Glycol-Propyl Glycol (SYSTANE OP) Place 2 drops into both eyes daily as needed (for dry eyes).     No facility-administered medications prior to visit.    Allergies  Allergen Reactions   Aspirin     bruising   Azithromycin     Other reaction(s): stomach upset   Doxycycline Hyclate     Other reaction(s): GI upset   Buspirone Hcl Palpitations   Mirtazapine Palpitations    ROS Review of Systems    Objective:    Physical Exam  BP 140/66 (BP Location: Left Arm, Patient Position: Standing, Cuff  Size: Normal)   Pulse 93   Ht 5' (1.524 m)   Wt 116 lb (52.6 kg)   BMI 22.65 kg/m  Wt Readings  from Last 3 Encounters:  08/14/20 116 lb (52.6 kg)  08/13/20 116 lb (52.6 kg)  07/14/20 114 lb 8 oz (51.9 kg)     Health Maintenance Due  Topic Date Due   Hepatitis C Screening  Never done   COLONOSCOPY (Pts 45-32yrs Insurance coverage will need to be confirmed)  Never done   Zoster Vaccines- Shingrix (1 of 2) Never done   MAMMOGRAM  04/28/2014   PNA vac Low Risk Adult (2 of 2 - PPSV23) 11/19/2015   COVID-19 Vaccine (3 - Booster for Pfizer series) 09/09/2019    There are no preventive care reminders to display for this patient.  Lab Results  Component Value Date   TSH 2.21 08/21/2018   Lab Results  Component Value Date   WBC 9.0 08/13/2020   HGB 12.9 08/13/2020   HCT 40.4 08/13/2020   MCV 99.0 08/13/2020   PLT 247 08/13/2020   Lab Results  Component Value Date   NA 134 (L) 08/13/2020   K 4.1 08/13/2020   CO2 26 08/13/2020   GLUCOSE 105 (H) 08/13/2020   BUN 24 (H) 08/13/2020   CREATININE 0.83 08/13/2020   BILITOT 0.9 08/13/2020   ALKPHOS 30 (L) 08/13/2020   AST 31 08/13/2020   ALT 23 08/13/2020   PROT 7.3 08/13/2020   ALBUMIN 4.5 08/13/2020   CALCIUM 10.1 08/13/2020   ANIONGAP 9 08/13/2020   GFR 67.32 07/09/2020   No results found for: CHOL No results found for: HDL No results found for: LDLCALC No results found for: TRIG No results found for: CHOLHDL No results found for: EYCX4G    Assessment & Plan:   Problem List Items Addressed This Visit   None   No orders of the defined types were placed in this encounter.   Follow-up: No follow-ups on file.    Annabell Sabal, CMA

## 2020-08-14 NOTE — Telephone Encounter (Signed)
Called and spoke with patient she is come in 08/14/2020 for nurse visit for b/p and pt will bring a her reading of b/p with her.

## 2020-08-14 NOTE — Anesthesia Preprocedure Evaluation (Addendum)
Anesthesia Evaluation  Patient identified by MRN, date of birth, ID band Patient awake    Reviewed: Allergy & Precautions, NPO status , Patient's Chart, lab work & pertinent test results  Airway Mallampati: II  TM Distance: >3 FB Neck ROM: Full    Dental  (+) Chipped,    Pulmonary neg pulmonary ROS,    Pulmonary exam normal breath sounds clear to auscultation       Cardiovascular negative cardio ROS Normal cardiovascular exam Rhythm:Regular Rate:Normal     Neuro/Psych PSYCHIATRIC DISORDERS Anxiety Depression Multiple sclerosis   Neuromuscular disease    GI/Hepatic Neg liver ROS, IBS (irritable bowel syndrome)   Endo/Other  Addison's disease  Renal/GU negative Renal ROS     Musculoskeletal  (+) Arthritis , Fibromyalgia -  Abdominal   Peds  Hematology negative hematology ROS (+)   Anesthesia Other Findings DJD LT KNEE  Reproductive/Obstetrics                            Anesthesia Physical Anesthesia Plan  ASA: 2  Anesthesia Plan: Regional and General   Post-op Pain Management: GA combined w/ Regional for post-op pain   Induction: Intravenous  PONV Risk Score and Plan: 3 and Ondansetron, Dexamethasone and Treatment may vary due to age or medical condition  Airway Management Planned: LMA  Additional Equipment:   Intra-op Plan:   Post-operative Plan: Extubation in OR  Informed Consent: I have reviewed the patients History and Physical, chart, labs and discussed the procedure including the risks, benefits and alternatives for the proposed anesthesia with the patient or authorized representative who has indicated his/her understanding and acceptance.     Dental advisory given  Plan Discussed with: CRNA  Anesthesia Plan Comments: (Reviewed APP note by Joslyn Hy, FNP )     Anesthesia Quick Evaluation

## 2020-08-14 NOTE — Progress Notes (Addendum)
Pt came per Dr Lucianne Muss for a b/p check and pt brought her reading from home. Pt is having up coming surgery and was concern about her b/p.  Check her b/p sitting and standing . Gave a copy of b/p to Dr. Lucianne Muss to review, will call pt back if has any recommendation.   Meredith Pel., CMA

## 2020-08-17 NOTE — Telephone Encounter (Signed)
Called and advised pt Please let patient know that she can take the fludrocortisone only on the days her standing blood pressure is below 120.  She needs to check sitting and standing blood pressure daily until her surgery. Pt verbalized understanding

## 2020-08-18 NOTE — Care Plan (Signed)
Ortho Bundle Case Management Note  Patient Details  Name: Katrina Rivera MRN: [id number] Date of Birth: 1945/02/24  Met with patient in the office prior to surgery. She will discharge to home with family to assist. Rolling walker and CPM ordered for home use. OPPT set up with [facility]. Patient and MD in agreement with plan.Choice offered                     DME Arranged:  Walker rolling, CPM DME Agency:  [employer]  HH Arranged:    [place] Agency:     Additional Comments: Please contact me with any questions of if this plan should need to change.  Ladell Heads,  [place] Specialist  [phone] 08/18/2020, 11:36 AM

## 2020-08-20 ENCOUNTER — Other Ambulatory Visit (HOSPITAL_COMMUNITY)
Admission: RE | Admit: 2020-08-20 | Discharge: 2020-08-20 | Disposition: A | Discharge status: Home / Self Care | Payer: PPO | Source: Ambulatory Visit | Attending: Orthopedic Surgery | Admitting: Orthopedic Surgery

## 2020-08-20 DIAGNOSIS — Z01812 Encounter for preprocedural laboratory examination: Secondary | ICD-10-CM | POA: Insufficient documentation

## 2020-08-20 DIAGNOSIS — Z20822 Contact with and (suspected) exposure to covid-19: Secondary | ICD-10-CM | POA: Insufficient documentation

## 2020-08-21 LAB — SARS CORONAVIRUS 2 (TAT 6-24 HRS): SARS Coronavirus 2: NEGATIVE

## 2020-08-23 MED ORDER — BUPIVACAINE LIPOSOME 1.3 % IJ SUSP
20.0000 mL | Freq: Once | INTRAMUSCULAR | Status: DC
  Filled 2020-08-23: qty 20

## 2020-08-24 ENCOUNTER — Ambulatory Visit (HOSPITAL_COMMUNITY)
Admission: RE | Admit: 2020-08-24 | Discharge: 2020-08-24 | Disposition: A | Discharge status: Home / Self Care | Payer: PPO | Attending: Orthopedic Surgery | Admitting: Orthopedic Surgery

## 2020-08-24 ENCOUNTER — Ambulatory Visit (HOSPITAL_COMMUNITY): Payer: PPO | Admitting: Emergency Medicine

## 2020-08-24 ENCOUNTER — Ambulatory Visit (HOSPITAL_COMMUNITY): Payer: PPO | Admitting: Certified Registered Nurse Anesthetist

## 2020-08-24 ENCOUNTER — Encounter (HOSPITAL_COMMUNITY): Payer: Self-pay | Admitting: Orthopedic Surgery

## 2020-08-24 ENCOUNTER — Encounter (HOSPITAL_COMMUNITY)
Admission: RE | Disposition: A | Discharge status: Home / Self Care | Payer: Self-pay | Source: Home / Self Care | Attending: Orthopedic Surgery

## 2020-08-24 DIAGNOSIS — M797 Fibromyalgia: Secondary | ICD-10-CM | POA: Diagnosis present

## 2020-08-24 DIAGNOSIS — G8929 Other chronic pain: Secondary | ICD-10-CM | POA: Diagnosis present

## 2020-08-24 DIAGNOSIS — E860 Dehydration: Secondary | ICD-10-CM | POA: Diagnosis present

## 2020-08-24 DIAGNOSIS — Z881 Allergy status to other antibiotic agents status: Secondary | ICD-10-CM | POA: Insufficient documentation

## 2020-08-24 DIAGNOSIS — Z79899 Other long term (current) drug therapy: Secondary | ICD-10-CM | POA: Insufficient documentation

## 2020-08-24 DIAGNOSIS — Z96652 Presence of left artificial knee joint: Secondary | ICD-10-CM | POA: Diagnosis not present

## 2020-08-24 DIAGNOSIS — G2581 Restless legs syndrome: Secondary | ICD-10-CM | POA: Diagnosis present

## 2020-08-24 DIAGNOSIS — Z886 Allergy status to analgesic agent status: Secondary | ICD-10-CM | POA: Diagnosis not present

## 2020-08-24 DIAGNOSIS — M1712 Unilateral primary osteoarthritis, left knee: Secondary | ICD-10-CM | POA: Diagnosis not present

## 2020-08-24 DIAGNOSIS — G35 Multiple sclerosis: Secondary | ICD-10-CM | POA: Diagnosis present

## 2020-08-24 DIAGNOSIS — R5383 Other fatigue: Secondary | ICD-10-CM | POA: Diagnosis present

## 2020-08-24 DIAGNOSIS — E2740 Unspecified adrenocortical insufficiency: Secondary | ICD-10-CM | POA: Diagnosis present

## 2020-08-24 DIAGNOSIS — I951 Orthostatic hypotension: Secondary | ICD-10-CM | POA: Diagnosis present

## 2020-08-24 DIAGNOSIS — G8918 Other acute postprocedural pain: Secondary | ICD-10-CM | POA: Diagnosis not present

## 2020-08-24 DIAGNOSIS — I10 Essential (primary) hypertension: Secondary | ICD-10-CM | POA: Diagnosis present

## 2020-08-24 DIAGNOSIS — M549 Dorsalgia, unspecified: Secondary | ICD-10-CM | POA: Diagnosis present

## 2020-08-24 DIAGNOSIS — G609 Hereditary and idiopathic neuropathy, unspecified: Secondary | ICD-10-CM | POA: Diagnosis present

## 2020-08-24 DIAGNOSIS — M21162 Varus deformity, not elsewhere classified, left knee: Secondary | ICD-10-CM | POA: Diagnosis not present

## 2020-08-24 DIAGNOSIS — E876 Hypokalemia: Secondary | ICD-10-CM | POA: Diagnosis not present

## 2020-08-24 DIAGNOSIS — R269 Unspecified abnormalities of gait and mobility: Secondary | ICD-10-CM

## 2020-08-24 HISTORY — PX: TOTAL KNEE ARTHROPLASTY: SHX125

## 2020-08-24 SURGERY — ARTHROPLASTY, KNEE, TOTAL
Anesthesia: Regional | Site: Knee | Laterality: Left

## 2020-08-24 MED ORDER — BUPIVACAINE-EPINEPHRINE 0.25% -1:200000 IJ SOLN
INTRAMUSCULAR | Status: DC | PRN
  Administered 2020-08-24: 30 mL

## 2020-08-24 MED ORDER — SODIUM CHLORIDE 0.9 % IV SOLN
2.0000 g | INTRAVENOUS | Status: AC
  Administered 2020-08-24: 2 g via INTRAVENOUS
  Filled 2020-08-24: qty 2

## 2020-08-24 MED ORDER — OXYCODONE HCL 5 MG PO TABS: 5.0000 mg | ORAL_TABLET | ORAL | 0 refills | Status: DC | PRN

## 2020-08-24 MED ORDER — OXYCODONE HCL 5 MG PO TABS
ORAL_TABLET | ORAL | Status: AC
  Administered 2020-08-24: 5 mg via ORAL
  Filled 2020-08-24: qty 1

## 2020-08-24 MED ORDER — POVIDONE-IODINE 7.5 % EX SOLN: Freq: Once | CUTANEOUS | Status: AC

## 2020-08-24 MED ORDER — POLYETHYLENE GLYCOL 3350 17 G PO PACK: 17.0000 g | PACK | Freq: Two times a day (BID) | ORAL | 0 refills | Status: AC

## 2020-08-24 MED ORDER — DEXAMETHASONE SODIUM PHOSPHATE 4 MG/ML IJ SOLN
INTRAMUSCULAR | Status: DC | PRN
  Administered 2020-08-24: 5 mg via INTRAVENOUS

## 2020-08-24 MED ORDER — FENTANYL CITRATE (PF) 100 MCG/2ML IJ SOLN
INTRAMUSCULAR | Status: AC
  Administered 2020-08-24: 50 ug via INTRAVENOUS
  Filled 2020-08-24: qty 2

## 2020-08-24 MED ORDER — ONDANSETRON HCL 4 MG/2ML IJ SOLN: 4.0000 mg | Freq: Once | INTRAMUSCULAR | Status: DC | PRN

## 2020-08-24 MED ORDER — PROPOFOL 10 MG/ML IV BOLUS
INTRAVENOUS | Status: AC
  Filled 2020-08-24: qty 20

## 2020-08-24 MED ORDER — SODIUM CHLORIDE 0.9 % IR SOLN
Status: DC | PRN
  Administered 2020-08-24 (×2): 1000 mL

## 2020-08-24 MED ORDER — OXYCODONE HCL 5 MG PO TABS: 5.0000 mg | ORAL_TABLET | Freq: Once | ORAL | Status: AC | PRN

## 2020-08-24 MED ORDER — ONDANSETRON HCL 4 MG/2ML IJ SOLN
INTRAMUSCULAR | Status: DC | PRN
  Administered 2020-08-24: 4 mg via INTRAVENOUS

## 2020-08-24 MED ORDER — POVIDONE-IODINE 10 % EX SWAB: 2.0000 "application " | Freq: Once | CUTANEOUS | Status: DC

## 2020-08-24 MED ORDER — ACETAMINOPHEN 500 MG PO TABS
1000.0000 mg | ORAL_TABLET | Freq: Once | ORAL | Status: AC
  Administered 2020-08-24: 1000 mg via ORAL
  Filled 2020-08-24: qty 2

## 2020-08-24 MED ORDER — DEXAMETHASONE SODIUM PHOSPHATE 10 MG/ML IJ SOLN
INTRAMUSCULAR | Status: AC
  Filled 2020-08-24: qty 1

## 2020-08-24 MED ORDER — LIDOCAINE 2% (20 MG/ML) 5 ML SYRINGE
INTRAMUSCULAR | Status: DC | PRN
  Administered 2020-08-24: 40 mg via INTRAVENOUS

## 2020-08-24 MED ORDER — LIDOCAINE 2% (20 MG/ML) 5 ML SYRINGE
INTRAMUSCULAR | Status: AC
  Filled 2020-08-24: qty 5

## 2020-08-24 MED ORDER — CEFUROXIME SODIUM 1.5 G IV SOLR
INTRAVENOUS | Status: AC
  Filled 2020-08-24: qty 1.5

## 2020-08-24 MED ORDER — CHLORHEXIDINE GLUCONATE 0.12 % MT SOLN
15.0000 mL | Freq: Once | OROMUCOSAL | Status: AC
  Administered 2020-08-24: 15 mL via OROMUCOSAL

## 2020-08-24 MED ORDER — METHYLPREDNISOLONE 4 MG PO TABS: 2.0000 mg | ORAL_TABLET | ORAL | 0 refills | Status: DC

## 2020-08-24 MED ORDER — PHENYLEPHRINE 40 MCG/ML (10ML) SYRINGE FOR IV PUSH (FOR BLOOD PRESSURE SUPPORT)
PREFILLED_SYRINGE | INTRAVENOUS | Status: AC
  Filled 2020-08-24: qty 10

## 2020-08-24 MED ORDER — FENTANYL CITRATE (PF) 250 MCG/5ML IJ SOLN
INTRAMUSCULAR | Status: AC
  Filled 2020-08-24: qty 5

## 2020-08-24 MED ORDER — PHENYLEPHRINE 40 MCG/ML (10ML) SYRINGE FOR IV PUSH (FOR BLOOD PRESSURE SUPPORT)
PREFILLED_SYRINGE | INTRAVENOUS | Status: DC | PRN
  Administered 2020-08-24 (×2): 120 ug via INTRAVENOUS
  Administered 2020-08-24: 80 ug via INTRAVENOUS
  Administered 2020-08-24: 120 ug via INTRAVENOUS
  Administered 2020-08-24: 80 ug via INTRAVENOUS

## 2020-08-24 MED ORDER — EPHEDRINE 5 MG/ML INJ
INTRAVENOUS | Status: AC
  Filled 2020-08-24: qty 5

## 2020-08-24 MED ORDER — LACTATED RINGERS IV BOLUS
250.0000 mL | Freq: Once | INTRAVENOUS | Status: AC
  Administered 2020-08-24: 250 mL via INTRAVENOUS

## 2020-08-24 MED ORDER — BUPIVACAINE LIPOSOME 1.3 % IJ SUSP
INTRAMUSCULAR | Status: DC | PRN
  Administered 2020-08-24: 20 mL

## 2020-08-24 MED ORDER — LACTATED RINGERS IV BOLUS
500.0000 mL | Freq: Once | INTRAVENOUS | Status: AC
  Administered 2020-08-24: 500 mL via INTRAVENOUS

## 2020-08-24 MED ORDER — MIDAZOLAM HCL 2 MG/2ML IJ SOLN
INTRAMUSCULAR | Status: AC
  Filled 2020-08-24: qty 2

## 2020-08-24 MED ORDER — BUPIVACAINE-EPINEPHRINE (PF) 0.25% -1:200000 IJ SOLN
INTRAMUSCULAR | Status: AC
  Filled 2020-08-24: qty 30

## 2020-08-24 MED ORDER — ORAL CARE MOUTH RINSE: 15.0000 mL | Freq: Once | OROMUCOSAL | Status: AC

## 2020-08-24 MED ORDER — EPHEDRINE SULFATE-NACL 50-0.9 MG/10ML-% IV SOSY
PREFILLED_SYRINGE | INTRAVENOUS | Status: DC | PRN
  Administered 2020-08-24 (×4): 5 mg via INTRAVENOUS

## 2020-08-24 MED ORDER — DOCUSATE SODIUM 100 MG PO CAPS: ORAL_CAPSULE | ORAL | 2 refills | Status: DC

## 2020-08-24 MED ORDER — AMISULPRIDE (ANTIEMETIC) 5 MG/2ML IV SOLN: 10.0000 mg | Freq: Once | INTRAVENOUS | Status: DC | PRN

## 2020-08-24 MED ORDER — 0.9 % SODIUM CHLORIDE (POUR BTL) OPTIME
TOPICAL | Status: DC | PRN
  Administered 2020-08-24: 1000 mL

## 2020-08-24 MED ORDER — PROPOFOL 10 MG/ML IV BOLUS
INTRAVENOUS | Status: DC | PRN
  Administered 2020-08-24: 200 mg via INTRAVENOUS

## 2020-08-24 MED ORDER — PHENYLEPHRINE HCL-NACL 10-0.9 MG/250ML-% IV SOLN
INTRAVENOUS | Status: DC | PRN
  Administered 2020-08-24: 50 ug/min via INTRAVENOUS

## 2020-08-24 MED ORDER — LACTATED RINGERS IV SOLN: INTRAVENOUS | Status: DC

## 2020-08-24 MED ORDER — BUPIVACAINE-EPINEPHRINE (PF) 0.5% -1:200000 IJ SOLN
INTRAMUSCULAR | Status: DC | PRN
  Administered 2020-08-24: 30 mL via PERINEURAL

## 2020-08-24 MED ORDER — SODIUM CHLORIDE 0.9 % IV SOLN: 2.0000 g | Freq: Four times a day (QID) | INTRAVENOUS | Status: DC

## 2020-08-24 MED ORDER — FENTANYL CITRATE (PF) 100 MCG/2ML IJ SOLN
25.0000 ug | INTRAMUSCULAR | Status: DC | PRN
  Administered 2020-08-24: 50 ug via INTRAVENOUS

## 2020-08-24 MED ORDER — SODIUM CHLORIDE (PF) 0.9 % IJ SOLN
INTRAMUSCULAR | Status: DC | PRN
  Administered 2020-08-24: 50 mL

## 2020-08-24 MED ORDER — ASPIRIN EC 325 MG PO TBEC: DELAYED_RELEASE_TABLET | ORAL | 0 refills | Status: DC

## 2020-08-24 MED ORDER — FLUDROCORTISONE ACETATE 0.1 MG PO TABS: 0.1000 mg | ORAL_TABLET | ORAL | 0 refills | Status: DC

## 2020-08-24 MED ORDER — TRANEXAMIC ACID-NACL 1000-0.7 MG/100ML-% IV SOLN
1000.0000 mg | INTRAVENOUS | Status: AC
  Administered 2020-08-24: 1000 mg via INTRAVENOUS
  Filled 2020-08-24: qty 100

## 2020-08-24 MED ORDER — MIDAZOLAM HCL 2 MG/2ML IJ SOLN
INTRAMUSCULAR | Status: DC | PRN
  Administered 2020-08-24: 1 mg via INTRAVENOUS

## 2020-08-24 MED ORDER — ONDANSETRON HCL 4 MG/2ML IJ SOLN
INTRAMUSCULAR | Status: AC
  Filled 2020-08-24: qty 2

## 2020-08-24 MED ORDER — FENTANYL CITRATE (PF) 100 MCG/2ML IJ SOLN
INTRAMUSCULAR | Status: DC | PRN
  Administered 2020-08-24: 25 ug via INTRAVENOUS
  Administered 2020-08-24 (×3): 50 ug via INTRAVENOUS
  Administered 2020-08-24: 25 ug via INTRAVENOUS
  Administered 2020-08-24: 50 ug via INTRAVENOUS

## 2020-08-24 MED ORDER — PHENYLEPHRINE HCL (PRESSORS) 10 MG/ML IV SOLN
INTRAVENOUS | Status: AC
  Filled 2020-08-24: qty 1

## 2020-08-24 MED ORDER — METHYLPREDNISOLONE ACETATE 40 MG/ML IJ SUSP
INTRAMUSCULAR | Status: AC
  Filled 2020-08-24: qty 1

## 2020-08-24 SURGICAL SUPPLY — 61 items
APL PRP STRL LF DISP 70% ISPRP (MISCELLANEOUS) ×2
ATTUNE PS FEM LT SZ 5 CEM KNEE (Femur) ×1 IMPLANT
ATTUNE PSRP INSR SZ5 6 KNEE (Insert) ×1 IMPLANT
BAG COUNTER SPONGE SURGICOUNT (BAG) ×1 IMPLANT
BAG SPEC THK2 15X12 ZIP CLS (MISCELLANEOUS) ×1
BAG SPNG CNTER NS LX DISP (BAG) ×1
BAG ZIPLOCK 12X15 (MISCELLANEOUS) ×2 IMPLANT
BASEPLATE TIBIAL ROTATING SZ 4 (Knees) ×1 IMPLANT
BLADE SAGITTAL 25.0X1.19X90 (BLADE) ×2 IMPLANT
BLADE SAW SGTL 13X75X1.27 (BLADE) ×2 IMPLANT
BLADE SURG SZ10 CARB STEEL (BLADE) ×4 IMPLANT
BOWL SMART MIX CTS (DISPOSABLE) ×2 IMPLANT
BSPLAT TIB 4 CMNT ROT PLAT STR (Knees) ×1 IMPLANT
CEMENT HV SMART SET (Cement) ×4 IMPLANT
CHLORAPREP W/TINT 26 (MISCELLANEOUS) ×4 IMPLANT
COVER SURGICAL LIGHT HANDLE (MISCELLANEOUS) ×2 IMPLANT
CUFF TOURN SGL QUICK 34 (TOURNIQUET CUFF) ×2
CUFF TRNQT CYL 34X4.125X (TOURNIQUET CUFF) ×1 IMPLANT
DECANTER SPIKE VIAL GLASS SM (MISCELLANEOUS) ×4 IMPLANT
DRAPE ORTHO 2.5IN SPLIT 77X108 (DRAPES) ×1 IMPLANT
DRAPE ORTHO SPLIT 77X108 STRL (DRAPES) ×2
DRAPE SHEET LG 3/4 BI-LAMINATE (DRAPES) ×2 IMPLANT
DRAPE U-SHAPE 47X51 STRL (DRAPES) ×2 IMPLANT
DRESSING MEPILEX FLEX 4X4 (GAUZE/BANDAGES/DRESSINGS) IMPLANT
DRSG AQUACEL AG ADV 3.5X10 (GAUZE/BANDAGES/DRESSINGS) ×2 IMPLANT
DRSG MEPILEX FLEX 4X4 (GAUZE/BANDAGES/DRESSINGS) ×2
DRSG PAD ABDOMINAL 8X10 ST (GAUZE/BANDAGES/DRESSINGS) ×4 IMPLANT
ELECT REM PT RETURN 15FT ADLT (MISCELLANEOUS) ×2 IMPLANT
GLOVE SURG ENC MOIS LTX SZ7 (GLOVE) ×2 IMPLANT
GLOVE SURG MICRO LTX SZ7.5 (GLOVE) ×2 IMPLANT
GLOVE SURG UNDER POLY LF SZ7 (GLOVE) ×2 IMPLANT
GLOVE SURG UNDER POLY LF SZ7.5 (GLOVE) ×2 IMPLANT
GOWN STRL REUS W/ TWL LRG LVL3 (GOWN DISPOSABLE) ×2 IMPLANT
GOWN STRL REUS W/TWL LRG LVL3 (GOWN DISPOSABLE) ×4
HANDPIECE INTERPULSE COAX TIP (DISPOSABLE) ×2
HOLDER FOLEY CATH W/STRAP (MISCELLANEOUS) ×1 IMPLANT
HOOD PEEL AWAY FLYTE STAYCOOL (MISCELLANEOUS) ×6 IMPLANT
KIT TURNOVER KIT A (KITS) ×2 IMPLANT
MANIFOLD NEPTUNE II (INSTRUMENTS) ×2 IMPLANT
MARKER SKIN DUAL TIP RULER LAB (MISCELLANEOUS) ×2 IMPLANT
NDL SAFETY ECLIPSE 18X1.5 (NEEDLE) ×1 IMPLANT
NEEDLE HYPO 18GX1.5 SHARP (NEEDLE) ×2
NS IRRIG 1000ML POUR BTL (IV SOLUTION) ×2 IMPLANT
PACK TOTAL KNEE CUSTOM (KITS) ×2 IMPLANT
PATELLA MEDIAL ATTUN 35MM KNEE (Knees) ×1 IMPLANT
PENCIL SMOKE EVACUATOR (MISCELLANEOUS) ×2 IMPLANT
PIN DRILL FIX HALF THREAD (BIT) ×1 IMPLANT
PIN STEINMAN FIXATION KNEE (PIN) ×1 IMPLANT
PROTECTOR NERVE ULNAR (MISCELLANEOUS) ×2 IMPLANT
SET HNDPC FAN SPRY TIP SCT (DISPOSABLE) ×1 IMPLANT
SPONGE T-LAP 18X18 ~~LOC~~+RFID (SPONGE) ×3 IMPLANT
STAPLER VISISTAT 35W (STAPLE) IMPLANT
STRIP CLOSURE SKIN 1/2X4 (GAUZE/BANDAGES/DRESSINGS) ×3 IMPLANT
SUT MNCRL AB 3-0 PS2 18 (SUTURE) ×2 IMPLANT
SUT VIC AB 0 CT1 36 (SUTURE) ×2 IMPLANT
SUT VIC AB 1 CT1 36 (SUTURE) ×2 IMPLANT
SUT VIC AB 2-0 CT1 27 (SUTURE) ×4
SUT VIC AB 2-0 CT1 TAPERPNT 27 (SUTURE) ×2 IMPLANT
SYR 30ML LL (SYRINGE) ×4 IMPLANT
TRAY FOLEY MTR SLVR 14FR STAT (SET/KITS/TRAYS/PACK) ×2 IMPLANT
WATER STERILE IRR 1000ML POUR (IV SOLUTION) ×4 IMPLANT

## 2020-08-24 NOTE — Progress Notes (Signed)
Orthopedic Tech Progress Note Patient Details:  Katrina Rivera October 30, 1945 [id number]  Patient ID: Katrina Rivera, female   DOB: 1945-06-10, 75 y.o.   MRN: [id number]  Katrina Rivera 08/24/2020, 11:40 AM Bone foam applied in pacu

## 2020-08-24 NOTE — Op Note (Signed)
MRN:     [id number] DOB/AGE:    05-05-1945 / 75 y.o.       OPERATIVE REPORT   DATE OF PROCEDURE:  08/24/2020      PREOPERATIVE DIAGNOSIS:   Primary Localized Osteoarthritis left Knee       Estimated body mass index is 22.65 kg/m as calculated from the following:   Height as of this encounter: 5' (1.524 m).   Weight as of this encounter: 52.6 kg.                                                       POSTOPERATIVE DIAGNOSIS:   Same                                                                 PROCEDURE:  Procedure(s): TOTAL KNEE ARTHROPLASTY Using Depuy Attune RP implants #5 Femur, #4Tibia, 33mm  RP bearing, 35 Patella    SURGEON: Myra Weng A. Thurston Hole, MD   ASSISTANT: Julien Girt, PA-C, present and scrubbed throughout the case, critical for retraction, instrumentation, and closure.  ANESTHESIA: Spinal with Adductor Nerve Block  TOURNIQUET TIME: 52 minutes   COMPLICATIONS:  None       SPECIMENS: None   INDICATIONS FOR PROCEDURE: The patient has djd of the knee with varus deformities, XR shows bone on bone arthritis. Patient has failed all conservative measures including anti-inflammatory medicines, narcotics, attempts at exercise and weight loss, cortisone injections and viscosupplementation.  Risks and benefits of surgery have been discussed, questions answered.    DESCRIPTION OF PROCEDURE: The patient identified by armband, received right adductor canal block and IV antibiotics, in the holding area at [facility]. Patient taken to the operating room, appropriate anesthetic monitors were attached. Spinal anesthesia induced with the patient in supine position, Foley catheter was inserted. Tourniquet applied high to the operative thigh. Lateral post and foot positioner applied to the table, the lower extremity was then prepped and draped in usual sterile fashion from the ankle to the tourniquet. Time-out procedure was performed. The limb was wrapped with an Esmarch bandage and the  tourniquet inflated to 365 mmHg.   We began the operation by making a 6cm anterior midline incision. Small bleeders in the skin and the subcutaneous tissue identified and cauterized. Transverse retinaculum was incised and reflected medially and a medial parapatellar arthrotomy was accomplished. the patella was everted and theprepatellar fat pad resected. The superficial medial collateral ligament was then elevated from anterior to posterior along the proximal flare of the tibia and anterior half of the menisci resected. The knee was hyperflexed exposing bone on bone arthritis. Peripheral and notch osteophytes as well as the cruciate ligaments were then resected. We continued to work our way around posteriorly along the proximal tibia, and externally rotated the tibia subluxing it out from underneath the femur. A McHale retractor was placed through the notch and a lateral Hohmann retractor placed, and an external tibial guide was placed.  The tibial cutting guide was pinned into place allowing resection of 4 mm of bone medially and about 6 mm of bone laterally because of her varus deformity.  Satisfied with the tibial resection, we then entered the distal femur 2 mm anterior to the PCL origin with the intramedullary guide rod and applied the distal femoral cutting guide set at 76mm, with 5 degrees of valgus. This was pinned along the epicondylar axis. At this point, the distal femoral cut was accomplished without difficulty. We then sized for a 5 femoral component and pinned the guide in 3 degrees of external rotation.The chamfer cutting guide was pinned into place. The anterior, posterior, and chamfer cuts were accomplished without difficulty followed by the  RP box cutting guide and the box cut. We also removed posterior osteophytes from the posterior femoral condyles. At this time, the knee was brought into full extension. We checked our extension and flexion gaps and found them symmetric at 6.  The patella  thickness measured at 38m m. We set the cutting guide at 13 and removed the posterior patella sized for 35 button and drilled the lollipop. The knee was then once again hyperflexed exposing the proximal tibia. We sized for a # 4 tibial base plate, applied the smokestack and the conical reamer followed by the the Delta fin keel punch. We then hammered into place the  RP trial femoral component, inserted a trial bearing, trial patellar button, and took the knee through range of motion from 0-130 degrees. No thumb pressure was required for patellar tracking.   At this point, all trial components were removed, a double batch of DePuy HV cement  was mixed and applied to all bony metallic mating surfaces. In order, we hammered into place the tibial tray and removed excess cement, the femoral component and removed excess cement, a 6 mm  RP bearing was inserted, and the knee brought to full extension with compression. The patellar button was clamped into place, and excess cement removed. While the cement cured the wound was irrigated out with normal saline solution pulse lavage, and exparel was injected throughout the knee. Ligament stability and patellar tracking were checked and found to be excellent..   The parapatellar arthrotomy was closed with  #1 Vicryl suture. The subcutaneous tissue with 0 and 2-0 undyed Vicryl suture, and 4-0 Monocryl.. A dressing of Aquaseal, 4 x 4, dressing sponges, Webril, and Ace wrap applied. Needle and sponge count were correct times 2.The patient awakened, extubated, and taken to recovery room without difficulty. Vascular status was normal, pulses 2+ and symmetric.    [name] 05/01/2017, 8:56 AM

## 2020-08-24 NOTE — Interval H&P Note (Signed)
History and Physical Interval Note:  08/24/2020 6:34 AM  Katrina Rivera  has presented today for surgery, with the diagnosis of DJD LT KNEE.  The various methods of treatment have been discussed with the patient and family. After consideration of risks, benefits and other options for treatment, the patient has consented to  Procedure(s): TOTAL KNEE ARTHROPLASTY (Left) as a surgical intervention.  The patient's history has been reviewed, patient examined, no change in status, stable for surgery.  I have reviewed the patient's chart and labs.  Questions were answered to the patient's satisfaction.     [name]

## 2020-08-24 NOTE — Anesthesia Postprocedure Evaluation (Signed)
Anesthesia Post Note  Patient: Katrina Rivera  Procedure(s) Performed: TOTAL KNEE ARTHROPLASTY (Left: Knee)     Patient location during evaluation: PACU Anesthesia Type: Regional and General Level of consciousness: awake Pain management: pain level controlled Vital Signs Assessment: post-procedure vital signs reviewed and stable Respiratory status: spontaneous breathing, nonlabored ventilation, respiratory function stable and patient connected to nasal cannula oxygen Cardiovascular status: blood pressure returned to baseline and stable Postop Assessment: no apparent nausea or vomiting Anesthetic complications: no   No notable events documented.  Last Vitals:  Vitals:   08/24/20 1130 08/24/20 1145  BP:  (!) 155/71  Pulse: 90 83  Resp:  18  Temp:    SpO2: 96% 100%    Last Pain:  Vitals:   08/24/20 1145  TempSrc:   PainSc: 2                  Fritzie Prioleau P Hallee Mckenny

## 2020-08-24 NOTE — Anesthesia Procedure Notes (Signed)
Spinal

## 2020-08-24 NOTE — Anesthesia Procedure Notes (Signed)
Procedure Name: LMA Insertion Date/Time: 08/24/2020 7:26 AM Performed by: Vanessa [place], CRNA Pre-anesthesia Checklist: Emergency Drugs available, Patient identified, Suction available and Patient being monitored Patient Re-evaluated:Patient Re-evaluated prior to induction Oxygen Delivery Method: Circle system utilized Preoxygenation: Pre-oxygenation with 100% oxygen Induction Type: IV induction Ventilation: Mask ventilation without difficulty LMA: LMA inserted LMA Size: 4.0 Number of attempts: 1 Placement Confirmation: positive ETCO2 and breath sounds checked- equal and bilateral Tube secured with: Tape Dental Injury: Teeth and Oropharynx as per pre-operative assessment

## 2020-08-24 NOTE — Anesthesia Procedure Notes (Signed)
Anesthesia Regional Block: Adductor canal block   Pre-Anesthetic Checklist: , timeout performed,  Correct Patient, Correct Site, Correct Laterality,  Correct Procedure,, site marked,  Risks and benefits discussed,  Surgical consent,  Pre-op evaluation,  At surgeon's request and post-op pain management  Laterality: Left  Prep: chloraprep       Needles:  Injection technique: Single-shot  Needle Type: Echogenic Stimulator Needle     Needle Length: 10cm  Needle Gauge: 20     Additional Needles:   Procedures:,,,, ultrasound used (permanent image in chart),,    Narrative:  Start time: 08/24/2020 6:50 AM End time: 08/24/2020 7:00 AM Injection made incrementally with aspirations every 5 mL.  Performed by: Personally  Anesthesiologist: Leonides Grills, MD  Additional Notes: Functioning IV was confirmed and monitors were applied. A time-out was performed. Hand hygiene and sterile gloves were used. The thigh was placed in a frog-leg position and prepped in a sterile fashion. A 20ga Bbraun echogenic stimulator needle was placed using ultrasound guidance.  Negative aspiration and negative test dose prior to incremental administration of local anesthetic. The patient tolerated the procedure well.

## 2020-08-24 NOTE — Evaluation (Signed)
Physical Therapy Evaluation Patient Details Name: Katrina Rivera MRN: [id number] DOB: February 02, 1946 Today's Date: 08/24/2020   History of Present Illness  Patient is 75 y.o. female s/p Lt TKA on 08/24/20 with PMH significant for OA, Addison's Disease, snemia, depression, anxiety, fibromyalgia, MS, osteoporosis, syncope, hypotension.   Clinical Impression  Katrina Rivera is a 75 y.o. female POD 0 s/p Lt TKA. Patient reports independence with rollator for mobility at baseline. Patient is now limited by functional impairments (see PT problem list below) and requires min guard/supervision for transfers and gait with RW. Patient was able to ambulate ~100 feet with RW and min guard/supervision and cues for safe walker management. Patient educated via handout on safe sequencing for stair mobility and verbalized safe guarding position for people assisting with mobility. Patient instructed in exercises to facilitate ROM and circulation. Patient will benefit from continued skilled PT interventions to address impairments and progress towards PLOF. Patient has met mobility goals at adequate level for discharge home; will continue to follow if pt continues acute stay to progress towards Mod I goals.     Follow Up Recommendations Outpatient PT;Follow surgeon's recommendation for DC plan and follow-up therapies    Equipment Recommendations  None recommended by PT    Recommendations for Other Services       Precautions / Restrictions Precautions Precautions: Fall Restrictions Weight Bearing Restrictions: No Other Position/Activity Restrictions: WBAT      Mobility  Bed Mobility Overal bed mobility: Needs Assistance Bed Mobility: Supine to Sit     Supine to sit: Min guard;Supervision;HOB elevated     General bed mobility comments: guard/supervision for safety, no assist or cues needed, pt has adjsutable bed at home    Transfers Overall transfer level: Needs assistance Equipment used: Rolling  walker (2 wheeled) Transfers: Sit to/from Stand Sit to Stand: Supervision;Min guard;From elevated surface         General transfer comment: cues for hand placement on RW, guarding/supervision for safety to rise from EOB and toilet.  Ambulation/Gait Ambulation/Gait assistance: Min guard;Supervision Gait Distance (Feet): 100 Feet Assistive device: Rolling walker (2 wheeled) Gait Pattern/deviations: Step-to pattern;Step-through pattern;Decreased stride length;Decreased weight shift to left;Decreased stance time - left Gait velocity: decr   General Gait Details: cues for step pattern and proximity to RW. no overt LOB or bucklng at Lt knee noted. pt progressedto step through while maintaining safe proximity to RW; supervision for safety.  Stairs Stairs:  (reviewed handout)          Wheelchair Mobility    Modified Rankin (Stroke Patients Only)       Balance Overall balance assessment: Needs assistance Sitting-balance support: Feet supported Sitting balance-Leahy Scale: Good     Standing balance support: During functional activity;Bilateral upper extremity supported Standing balance-Leahy Scale: Fair                               Pertinent Vitals/Pain Pain Assessment: 0-10 Pain Score: 4  Pain Location: Lt knee Pain Descriptors / Indicators: Aching;Heaviness Pain Intervention(s): Limited activity within patient's tolerance;Monitored during session;Repositioned    Home Living Family/patient expects to be discharged to:: Private residence Living Arrangements: Spouse/significant other Available Help at Discharge: Family Type of Home: House Home Access: Stairs to enter Entrance Stairs-Rails: None Entrance Stairs-Number of Steps: 1 Home Layout: One level Home Equipment: Environmental consultant - 2 wheels;Cane - single point;Walker - 4 wheels;Bedside commode;Shower seat;Grab bars - tub/shower      Prior Function  Level of Independence: Independent with assistive device(s)          Comments: pt using Rollator for mobility     Hand Dominance   Dominant Hand: Right    Extremity/Trunk Assessment   Upper Extremity Assessment Upper Extremity Assessment: Overall WFL for tasks assessed    Lower Extremity Assessment Lower Extremity Assessment: LLE deficits/detail LLE Deficits / Details: good quad activation, no extensor lag with SLR (pt reports bil foot drop, 4-/5 for dorsflexion strength) LLE Sensation: WNL LLE Coordination: WNL    Cervical / Trunk Assessment Cervical / Trunk Assessment: Normal  Communication   Communication: No difficulties  Cognition Arousal/Alertness: Awake/alert Behavior During Therapy: WFL for tasks assessed/performed Overall Cognitive Status: Within Functional Limits for tasks assessed                                        General Comments      Exercises Total Joint Exercises Ankle Circles/Pumps: AROM;Both;10 reps;Seated Quad Sets: AROM;Left;5 reps;Seated Short Arc Quad: AROM;Left;5 reps;Seated Heel Slides: AROM;Left;5 reps;Seated Hip ABduction/ADduction: AROM;Left;5 reps;Seated Straight Leg Raises: AROM;Left;5 reps;Seated   Assessment/Plan    PT Assessment Patient needs continued PT services  PT Problem List Decreased range of motion;Decreased strength;Decreased activity tolerance;Decreased balance;Decreased mobility;Decreased knowledge of use of DME;Decreased safety awareness;Decreased knowledge of precautions;Pain       PT Treatment Interventions Gait training;DME instruction;Stair training;Functional mobility training;Therapeutic activities;Therapeutic exercise;Balance training;Neuromuscular re-education;Patient/family education    PT Goals (Current goals can be found in the Care Plan section)  Acute Rehab PT Goals Patient Stated Goal: get home PT Goal Formulation: With patient Time For Goal Achievement: 08/31/20 Potential to Achieve Goals: Good    Frequency 7X/week   Barriers to  discharge        Co-evaluation               AM-PAC PT "6 Clicks" Mobility  Outcome Measure Help needed turning from your back to your side while in a flat bed without using bedrails?: None Help needed moving from lying on your back to sitting on the side of a flat bed without using bedrails?: A Little Help needed moving to and from a bed to a chair (including a wheelchair)?: A Little Help needed standing up from a chair using your arms (e.g., wheelchair or bedside chair)?: A Little Help needed to walk in hospital room?: A Little Help needed climbing 3-5 steps with a railing? : A Little 6 Click Score: 19    End of Session Equipment Utilized During Treatment: Gait belt Activity Tolerance: Patient tolerated treatment well Patient left: in chair;with call bell/phone within reach Nurse Communication: Mobility status PT Visit Diagnosis: Muscle weakness (generalized) (M62.81);Difficulty in walking, not elsewhere classified (R26.2)    Time: 1100-1135 PT Time Calculation (min) (ACUTE ONLY): 35 min   Charges:   PT Evaluation $PT Eval Low Complexity: 1 Low PT Treatments $Gait Training: 8-22 mins        Verner Mould, DPT Acute Rehabilitation Services Office [phone] Pager [phone]   Jacques Navy 08/24/2020, 12:48 PM

## 2020-08-24 NOTE — Transfer of Care (Signed)
Immediate Anesthesia Transfer of Care Note  Patient: Katrina Rivera  Procedure(s) Performed: TOTAL KNEE ARTHROPLASTY (Left: Knee)  Patient Location: PACU  Anesthesia Type:General and GA combined with regional for post-op pain  Level of Consciousness: awake, alert , oriented and patient cooperative  Airway & Oxygen Therapy: Patient Spontanous Breathing and Patient connected to face mask  Post-op Assessment: Report given to RN and Post -op Vital signs reviewed and stable  Post vital signs: Reviewed and stable  Last Vitals:  Vitals Value Taken Time  BP 155/67 08/24/20 0906  Temp    Pulse 98 08/24/20 0909  Resp 20 08/24/20 0909  SpO2 100 % 08/24/20 0909  Vitals shown include unvalidated device data.  Last Pain:  Vitals:   08/24/20 0545  TempSrc: Oral  PainSc:       Patients Stated Pain Goal: 4 (08/24/20 0537)  Complications: No notable events documented.

## 2020-08-25 ENCOUNTER — Encounter (HOSPITAL_COMMUNITY): Payer: Self-pay | Admitting: Orthopedic Surgery

## 2020-08-26 ENCOUNTER — Other Ambulatory Visit: Payer: Self-pay | Admitting: Neurology

## 2020-08-26 DIAGNOSIS — M1712 Unilateral primary osteoarthritis, left knee: Secondary | ICD-10-CM | POA: Diagnosis not present

## 2020-08-27 ENCOUNTER — Telehealth: Payer: Self-pay | Admitting: *Deleted

## 2020-08-27 NOTE — Telephone Encounter (Addendum)
Pt left VM on nurse line stating concern for bruising post TKA 08/25/2020 " it's all the way down from my hip to my foot."  Note pt had work up with negative Von Willebrand results.  Pt is on chronic steroids ( for Addison's ) and currently 325 mg ASA s/p TKA protocol.  Pt is being seen by her orthopedist.  " I just am concerned because of how severe this bruising is "  This RN discusses the physical trauma incurred per surgery that likely resulted in above - bruising in her current status ( on steroids and asa ) is not unusual.  Reviewed with her concern more related to if she was having swelling that could be cellulitis which she states they are monitoring and she will see Dr Thurston Hole this week for follow up post surgery.  She is using ice per post op recommendations.  This RN informed her above would be given to [facility]/NP for review per her request.

## 2020-08-28 DIAGNOSIS — M1712 Unilateral primary osteoarthritis, left knee: Secondary | ICD-10-CM | POA: Diagnosis not present

## 2020-08-31 DIAGNOSIS — M1712 Unilateral primary osteoarthritis, left knee: Secondary | ICD-10-CM | POA: Diagnosis not present

## 2020-09-02 DIAGNOSIS — M1712 Unilateral primary osteoarthritis, left knee: Secondary | ICD-10-CM | POA: Diagnosis not present

## 2020-09-03 DIAGNOSIS — M1712 Unilateral primary osteoarthritis, left knee: Secondary | ICD-10-CM | POA: Diagnosis not present

## 2020-09-08 DIAGNOSIS — M1712 Unilateral primary osteoarthritis, left knee: Secondary | ICD-10-CM | POA: Diagnosis not present

## 2020-09-11 DIAGNOSIS — M1712 Unilateral primary osteoarthritis, left knee: Secondary | ICD-10-CM | POA: Diagnosis not present

## 2020-09-14 DIAGNOSIS — M1712 Unilateral primary osteoarthritis, left knee: Secondary | ICD-10-CM | POA: Diagnosis not present

## 2020-09-16 DIAGNOSIS — M1712 Unilateral primary osteoarthritis, left knee: Secondary | ICD-10-CM | POA: Diagnosis not present

## 2020-09-18 DIAGNOSIS — M1712 Unilateral primary osteoarthritis, left knee: Secondary | ICD-10-CM | POA: Diagnosis not present

## 2020-09-23 DIAGNOSIS — M1712 Unilateral primary osteoarthritis, left knee: Secondary | ICD-10-CM | POA: Diagnosis not present

## 2020-09-24 DIAGNOSIS — M1712 Unilateral primary osteoarthritis, left knee: Secondary | ICD-10-CM | POA: Diagnosis not present

## 2020-09-25 DIAGNOSIS — M1712 Unilateral primary osteoarthritis, left knee: Secondary | ICD-10-CM | POA: Diagnosis not present

## 2020-09-30 DIAGNOSIS — M1712 Unilateral primary osteoarthritis, left knee: Secondary | ICD-10-CM | POA: Diagnosis not present

## 2020-10-02 DIAGNOSIS — M1712 Unilateral primary osteoarthritis, left knee: Secondary | ICD-10-CM | POA: Diagnosis not present

## 2020-10-05 DIAGNOSIS — M1712 Unilateral primary osteoarthritis, left knee: Secondary | ICD-10-CM | POA: Diagnosis not present

## 2020-10-09 DIAGNOSIS — M1712 Unilateral primary osteoarthritis, left knee: Secondary | ICD-10-CM | POA: Diagnosis not present

## 2020-10-14 DIAGNOSIS — M1712 Unilateral primary osteoarthritis, left knee: Secondary | ICD-10-CM | POA: Diagnosis not present

## 2020-10-15 DIAGNOSIS — M1712 Unilateral primary osteoarthritis, left knee: Secondary | ICD-10-CM | POA: Diagnosis not present

## 2020-10-16 DIAGNOSIS — M1712 Unilateral primary osteoarthritis, left knee: Secondary | ICD-10-CM | POA: Diagnosis not present

## 2020-10-19 DIAGNOSIS — M1712 Unilateral primary osteoarthritis, left knee: Secondary | ICD-10-CM | POA: Diagnosis not present

## 2020-10-21 DIAGNOSIS — M1712 Unilateral primary osteoarthritis, left knee: Secondary | ICD-10-CM | POA: Diagnosis not present

## 2020-10-26 DIAGNOSIS — M1712 Unilateral primary osteoarthritis, left knee: Secondary | ICD-10-CM | POA: Diagnosis not present

## 2020-10-28 DIAGNOSIS — M1712 Unilateral primary osteoarthritis, left knee: Secondary | ICD-10-CM | POA: Diagnosis not present

## 2020-11-05 ENCOUNTER — Other Ambulatory Visit: Payer: Self-pay | Admitting: Endocrinology

## 2020-11-05 DIAGNOSIS — M1712 Unilateral primary osteoarthritis, left knee: Secondary | ICD-10-CM | POA: Diagnosis not present

## 2020-11-06 ENCOUNTER — Other Ambulatory Visit: Payer: Self-pay | Admitting: Endocrinology

## 2020-11-09 DIAGNOSIS — M1712 Unilateral primary osteoarthritis, left knee: Secondary | ICD-10-CM | POA: Diagnosis not present

## 2020-11-13 DIAGNOSIS — M1712 Unilateral primary osteoarthritis, left knee: Secondary | ICD-10-CM | POA: Diagnosis not present

## 2020-11-16 ENCOUNTER — Other Ambulatory Visit (INDEPENDENT_AMBULATORY_CARE_PROVIDER_SITE_OTHER): Payer: PPO

## 2020-11-16 ENCOUNTER — Other Ambulatory Visit: Payer: Self-pay

## 2020-11-16 DIAGNOSIS — I951 Orthostatic hypotension: Secondary | ICD-10-CM

## 2020-11-16 DIAGNOSIS — M1712 Unilateral primary osteoarthritis, left knee: Secondary | ICD-10-CM | POA: Diagnosis not present

## 2020-11-16 LAB — BASIC METABOLIC PANEL
BUN: 31 mg/dL — ABNORMAL HIGH (ref 6–23)
CO2: 27 mEq/L (ref 19–32)
Calcium: 10.7 mg/dL — ABNORMAL HIGH (ref 8.4–10.5)
Chloride: 101 mEq/L (ref 96–112)
Creatinine, Ser: 0.91 mg/dL (ref 0.40–1.20)
GFR: 62.75 mL/min (ref >60.00)
Glucose, Bld: 103 mg/dL — ABNORMAL HIGH (ref 70–99)
Potassium: 4.5 mEq/L (ref 3.5–5.1)
Sodium: 137 mEq/L (ref 135–145)

## 2020-11-17 ENCOUNTER — Ambulatory Visit: Payer: PPO | Admitting: Neurology

## 2020-11-17 ENCOUNTER — Other Ambulatory Visit: Payer: PPO

## 2020-11-17 ENCOUNTER — Encounter: Payer: Self-pay | Admitting: Neurology

## 2020-11-17 VITALS — BP 149/78 | HR 77 | Ht 60.0 in | Wt 112.0 lb

## 2020-11-17 DIAGNOSIS — G2581 Restless legs syndrome: Secondary | ICD-10-CM

## 2020-11-17 DIAGNOSIS — R269 Unspecified abnormalities of gait and mobility: Secondary | ICD-10-CM | POA: Diagnosis not present

## 2020-11-17 DIAGNOSIS — G35 Multiple sclerosis: Secondary | ICD-10-CM | POA: Diagnosis not present

## 2020-11-17 MED ORDER — DULOXETINE HCL 30 MG PO CPEP: 30.0000 mg | ORAL_CAPSULE | Freq: Every day | ORAL | 11 refills | Status: DC

## 2020-11-17 MED ORDER — HORIZANT 600 MG PO TBCR: 1.0000 | EXTENDED_RELEASE_TABLET | Freq: Every day | ORAL | 4 refills | Status: DC

## 2020-11-17 MED ORDER — GABAPENTIN 100 MG PO CAPS: ORAL_CAPSULE | ORAL | 4 refills | Status: DC

## 2020-11-17 NOTE — Progress Notes (Signed)
ASSESSMENT AND PLAN 75 y.o. year old female   Relapsing remitting multiple sclerosis -Last MRIs in 2015, evidence of supratentorium, infratentorium, spinal cord involvement -Is not on immune modulating therapy -Not MRI candidate due to spinal stimulator  Chronic low back pain, bilateral lower extremity pain, status post spinal stimulator in January 2018 Is consider switch to neuro model of pain stimulator, by January 2023  Restless leg syndrome Gait disorder, paresthesia lower extremities  Previously tried and failed multiple different medications, Requip, Mirapex, Neupro,  Overall doing well, stable on Horizant 600 mg at bedtime, gabapentin 100 to 200 mg as needed Intermittent diffuse body achy pain  Add on Cymbalta 30 mg daily She will continue follow-up with pain management and primary care physician, and return to clinic for new issues  HISTORY OF PRESENT ILLNESS: Katrina Rivera is a very pleasant 75 year-old right-handed woman, with relapsing remitting multiple sclerosis, was treated with Betaseron 4 -5 years, but could not tolerate the side effect, now is not on any immunomodulation therapy. neuropathic pain of bilateral lower extremities.   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    She has baseline gait difficulty, she fell in May 2013, fractured both elbows and her left knee cap.  She intermittently has been using a cane or walker.  She could not tolerate Requip because of nausea. She has been on  gabapentin 300 mg 3 bid. She gained significant weight gain when on Lyrica, and therefore was switched back to gabapentin. She has orthostatic hypotension, and is on midodrine 2.5 mg and fludrocortisone 0.1 mg 1 in AM and 2 at night, and she is also on 20 mg of hydrocortisone.   She complains of bilateral lower extremity burning stinging sensation, getting worse when she sits still, difficulty falling into sleep, she has the urge to move because of bilateral extremity discomfort, has tried Requip, could not tolerate it because of GI side effects, Neuprol patch does not work either, she also tried Elavil, Cymbalta in the past, could not tolerate it due to side effect.    Jan 01/2014: She came in urgently for acute worsening of her generalized condition since her lumbar decompression surgery in January 24 2013 by Dr. Marikay Alar, prior to surgery, she suffered left-sided low back pain, radiating pain to bilateral lower extremity, left is much worse than her right side, there was left L5-S1 extraforaminal herniated nucleus pulposus with left L5 radiculopathy she had left L5-S1 extraforaminal microdiscectomy utilizing microscopic dissection.  She had overnight stay at the hospital the day after surgery, she fell when trying to get up using bathroom, landed on her left side, surgery did help her lower back pain, radiating pain to her left leg, but she complained of worsening bilateral lower extremity deep achy pain, worsening gait difficulty, she has not used walker for 3 years, now she began to use her walker, she felt her skin is so tight at bilateral lower extremity, left leg swelling, also has difficulty swallowing, blurred vision, worsening fatigue, difficulty concentration, she has not had MRI evaluation for her multiple sclerosis for many  years, is not on any immunomodulation therapy    UPDATE Mar 01, 2014:She came in with a list of complains, much worse compared to presurgical level. She complains of  blurred vision, difficulty sleeping, difficulty concentrating, dizziness, worsening gait difficulty, The most bothersome symptoms are bilateral lower extremity pain, constant, knife cutting pain, left worse than right Left leg showed no DVT on doppler study. She had 15 LB weight gain over one month, more difficulty sleepy, body shaking, more difficulty sleepy.  She has tried fentanyl patch 50 mcg, Trileptal, Neurontin without helping her symptoms .She is going to be seen by her surgeon Dr. Yetta Barre in 3 days  We have reviewed MRI of the brain cervical lumbar spine together, these were done at [facility] imaging in January 2015 MRI lumbar: L5-S1: disc bulging and facet hypertrophy, status post microdiscectomy on the left, with expected post-surgical changes. Multi-level facet hypertrophy, with no spinal stenosis or foraminal narrowing.  MRI cervical: Possible small chronic demyelinating plaque at C6 on the right side. No abnormal enhancing lesions  MRI brain: Multiple supratentorial and 1 infratentorial chronic demyelinating plaques. No acute plaques. Mild cerebellar tonsillar ectopia there was no significant change in the brain lesions,    UPDATE 08/22/13  Patient returns for follow up. Her biggest  complaint today is restless legs. She has never tried Neupro. She has had multiple trials of medications for her chronic pain. She is currently taking fentanyl, Trileptal, Cymbalta, lidocaine gel   UPDATE Feb 27 2014:She is not on any long-term immunomodulation therapy for her relapsing remitting multiple sclerosis, neurological deficit has been fairly stable, she has baseline gait difficulty, the most bothersome symptoms for her is low back pain, bilateral lower extremity deep achy pain, radiating pain from left lower back to her left leg,  She was recently found to have very low cortisol level, under endocrinologist Dr. Remus Blake care,  UPDATE July 23 2014: She had lumbar decompression by Dr. Marikay Alar in Jun 18 2014, which did help her low back pain, but now she experienced worsening left lower extremity spasm, left calf spasm so hard, as if a knot was tied, difficulty walking, she was giving a steroid package, no help,  She complained excessive weight gain with Lyrica, Neurontin did not help,   UPDATE July 27th 2016:  She came in with a list of complaints, continue complains of unbalanced, staggering, generalized weakness, numbness tingling at bilateral lower extremity, worsening at her left leg, frequent urination, difficulty concentrating, difficulty with multitasking   UPDATE Oct 22 2014: She had a syncope episode on October 08 2014, was taken to the emergency room, have reviewed ED record, blood pressure was 200/60s laboratory showed normal CBC, CMP,  this happened in the setting of missing her hydrocortisone dose, because of nausea, lack of appetite, dehydration, she is taking hydrocortisone because of Addison's disease, now she is on hydrocortisone shots,She is back to her baseline now, mild gait difficulty, also complains of anxiety, constant bilateral lower chamber paresthesia, she wants to get off Cymbalta 30 mg daily, worry about the long-term side effect. She also complains of bilateral neck, shoulder or upper extremity itching,   UPDATE Apr 05 2016: She had a spinal stimulator placement by neurosurgeon Dr. Buck Mam in January third 2018, which has helped her low back pain,and leg pain.   She had sinus infection, was treated with Zpack and nausea,  She has iron infusion in Feb 2018,  She noticed that she has worsening gait abnormality,  She also  has diffuse body achy pain. She has tried Flexeril without benefit, previously tried and failed gabapentin, Cymbalta, fentanyl patch,   CPK was normal 39 WBC showed hemoglobin of 10 point 8, which was mildly decreased normal free T4, TSH, CMP, creat 1.1,  normal B12,   UPDATE Sept 24th 2020: She is overall stable, ambulate without assistant, but with  mild unsteady gait, she had a spinal stimulator placement in 2018, is no longer a candidate for MRI, I personally reviewed previous MRIs in 2015, MRI of the brain, multiple supratentorium, and one infratentorial chronic demyelinating plaque, no acute abnormality.  MRI of cervical spine, possible small chronic demyelinating plaque at right C6.  MRI of lumbar spine, multilevel degenerative changes, postsurgical changes L5-S1 with evidence of left microdiscectomy.   She is now taking Horizant 300 mg every night, which does help her restless leg symptoms some, but at the end of the day, she complains of whole body tightness,   Laboratory evaluation July 2020, normal CMP, TSH, free T4, BMP with GFR of 56, vitamin D of 87,  UPDATE Nov 17 2020: She is accompanied by her husband at today's visit, just had right knee replacement in July 2022, finishing up physical therapy, continue has gait abnormality, wearing ankle brace now, seems to help her some  She continue to take Horizant 600 mg at bedtime, gabapentin 100 mg 1 to 2 tablets every night as needed  She complains of intermittent diffuse body achy sensation,  REVIEW OF SYSTEMS: Out of a complete 14 system review of symptoms, the patient complains only of the following symptoms, and all other reviewed systems are negative.  Walking difficulty  ALLERGIES: Allergies  Allergen Reactions   Aspirin     bruising   Azithromycin     Other reaction(s): stomach upset   Doxycycline Hyclate     Other reaction(s): GI upset   Buspirone Hcl Palpitations   Mirtazapine Palpitations    HOME MEDICATIONS: Outpatient Medications Prior to Visit  Medication Sig Dispense Refill   acetaminophen (TYLENOL) 500 MG tablet Take 1,000 mg by mouth at bedtime.     acidophilus (RISAQUAD) CAPS capsule Take 1 capsule by mouth in the morning.     alfuzosin (UROXATRAL) 10 MG 24 hr tablet Take 10 mg by mouth daily with breakfast.     Ascorbic Acid (VITAMIN C PO) Take 1  tablet by mouth daily.     B Complex Vitamins (VITAMIN B COMPLEX PO) Take 1 capsule by mouth in the morning.     celecoxib (CELEBREX) 200 MG capsule Take 200 mg by mouth daily.     cephALEXin (KEFLEX) 250 MG capsule Take 250 mg by mouth every other day.     Cholecalciferol (VITAMIN D3 PO) Take 1 tablet by mouth in the morning.     CRANBERRY PO Take 2 tablets by mouth in the morning.     dicyclomine (BENTYL) 10 MG capsule Take 10 mg by mouth at bedtime.     docusate sodium (COLACE) 100 MG capsule 1 tablet twice a day while on narcotics  STOOL SOFTENER 60 capsule 2   Ferrous Sulfate Dried 200 (65 FE) MG TABS Take 65 mg by mouth in the morning.     fludrocortisone (FLORINEF) 0.1 MG tablet Take 1 tablet (0.1 mg total) by mouth See admin instructions. Take 0.1 mg by mouth on Tuesday, Thursday and Saturday 1 tablet 0   fluticasone (FLONASE) 50 MCG/ACT nasal spray Place 1 spray into both nostrils daily as needed for allergies  or rhinitis.     gabapentin (NEURONTIN) 100 MG capsule Take 1 capsule twice daily as needed for breakthrough pain 60 capsule 3   Gabapentin Enacarbil (HORIZANT) 600 MG TBCR Take 1 tablet (600 mg total) by mouth at bedtime. 90 tablet 3   methylPREDNISolone (MEDROL) 4 MG tablet TAKE 1 TABLET (4MG ) AT BREAKFAST AND 1/2 TABLET (2MG ) AT 4PM. 135 tablet 2   mirtazapine (REMERON) 15 MG tablet Take 15 mg by mouth at bedtime.     Multiple Vitamin (MULTIVITAMIN) tablet Take 1 tablet by mouth in the morning.     OVER THE COUNTER MEDICATION Take 1 capsule by mouth at bedtime. Sleep 3     Polyethyl Glycol-Propyl Glycol (SYSTANE OP) Place 2 drops into both eyes daily as needed (for dry eyes).     polyethylene glycol (MIRALAX) 17 g packet Take 17 g by mouth 2 (two) times daily. 17 grams in 6 oz of favorite drink twice a day until bowel movement.  LAXITIVE.  Restart if two days since last bowel movement 14 packet 0   aspirin EC 325 MG tablet 1 tablet a day for 30 days to prevent blood clots 30  tablet 0   oxyCODONE (OXY IR/ROXICODONE) 5 MG immediate release tablet Take 1 tablet (5 mg total) by mouth every 4 (four) hours as needed for severe pain. 42 tablet 0   No facility-administered medications prior to visit.    PAST MEDICAL HISTORY: Past Medical History:  Diagnosis Date   Addison's disease (HCC)    takes Solu Cortef daily   Anemia    takes Ferrous Sulfate daily   Anxiety    takes Xanax nightly   Arthritis    Chronic back pain    stenosis   Depression    takes Cymbalta daily   Dizziness    if b/p drops    Fibromyalgia    Fibromyalgia    History of blood transfusion    no abnormal reaction noted   History of bronchitis    many yrs ago    Hypokalemia    takes Potassium daily   Hypotension    Hypotension    takes Florinef daily   IBS (irritable bowel syndrome)    takes Align daily   Insomnia    takes Trazodone nightly   Joint pain    Multiple sclerosis (HCC)    doesn't take any meds   Multiple sclerosis (HCC)    Nocturia    Osteoporosis    Peripheral neuropathy    Primary localized osteoarthritis of left knee 08/11/2020   Restless leg syndrome    Seasonal allergies    takes Zyrtec daily;uses Flonase daily as needed   Syncope    Urinary frequency    takes Flomax daily   Weakness    numbness and tingling    PAST SURGICAL HISTORY: Past Surgical History:  Procedure Laterality Date   ABDOMINAL HYSTERECTOMY  02/07/1986   APPENDECTOMY  02/07/1986   CESAREAN SECTION  1973/1977   x2   CHOLECYSTECTOMY  02/08/1995   COLECTOMY  02/08/1988   COLONOSCOPY     ESOPHAGOGASTRODUODENOSCOPY     EYE SURGERY     bilateral - /w IOL   FRACTURE SURGERY Right    rods and screws   LUMBAR LAMINECTOMY/DECOMPRESSION MICRODISCECTOMY Left 01/24/2013   Procedure: LUMBAR FIVE TO SACRAL ONE LUMBAR LAMINECTOMY/DECOMPRESSION MICRODISCECTOMY 1 LEVEL;  Surgeon: 04/07/1988, MD;  Location: [facility];  Service: Neurosurgery;  Laterality: Left;   MAXIMUM ACCESS  (MAS)POSTERIOR  LUMBAR INTERBODY FUSION (PLIF) 1 LEVEL N/A 06/18/2014   Procedure: MAXIMUM ACCESS SURGERY POSTERIOR LUMBAR INTERBODY FUSION LUMBAR FIVE TO SACRAL ONE ;  Surgeon: Tia Alert, MD;  Location: [facility];  Service: Neurosurgery;  Laterality: N/A;   SPINAL CORD STIMULATOR IMPLANT     TOTAL KNEE ARTHROPLASTY Left 08/24/2020   Procedure: TOTAL KNEE ARTHROPLASTY;  Surgeon: Salvatore Marvel, MD;  Location: [facility];  Service: Orthopedics;  Laterality: Left;    FAMILY HISTORY: Family History  Problem Relation Age of Onset   Hypertension Mother    Stroke Mother    Heart attack Father    Tremor Brother     SOCIAL HISTORY: Social History   Socioeconomic History   Marital status: Married    Spouse name: Katrina Rivera   Number of children: 2   Years of education: 12   Highest education level: Not on file  Occupational History    Employer: DISABLED    Comment: Disabled  Tobacco Use   Smoking status: Never   Smokeless tobacco: Never  Vaping Use   Vaping Use: Never used  Substance and Sexual Activity   Alcohol use: Never   Drug use: Never   Sexual activity: Not on file  Other Topics Concern   Not on file  Social History Narrative   Pt lives at home with spouse. Gabriel Rung) [phone] (patient's cell)   Katrina Rivera's cell  [phone]      Caffeine Use: 1 cups daily.Right handed.Disabled.Education - high schoolPatient has two adult children.   Social Determinants of Health   Financial Resource Strain: Not on file  Food Insecurity: Not on file  Transportation Needs: Not on file  Physical Activity: Not on file  Stress: Not on file  Social Connections: Not on file  Intimate Partner Violence: Not on file   PHYSICAL EXAM  Vitals:   11/17/20 1114  BP: (!) 149/78  Pulse: 77  Weight: 112 lb (50.8 kg)  Height: 5' (1.524 m)   Body mass index is 21.87 kg/m.   PHYSICAL EXAMNIATION:  Gen: NAD, conversant, well nourised, well groomed                     Cardiovascular: Regular rate  rhythm, no peripheral edema, warm, nontender. Eyes: Conjunctivae clear without exudates or hemorrhage Neck: Supple, no carotid bruits. Pulmonary: Clear to auscultation bilaterally   NEUROLOGICAL EXAM:  MENTAL STATUS: Speech/Cognition: Awake, alert, normal speech, oriented to history taking and casual conversation.  CRANIAL NERVES: CN II: Visual fields are full to confrontation.  Pupils are round equal and briskly reactive to light. CN III, IV, VI: extraocular movement are normal. No ptosis. CN V: Facial sensation is intact to light touch. CN VII: Face is symmetric with normal eye closure and smile. CN VIII: Hearing is normal to casual conversation CN IX, X: Palate elevates symmetrically. Phonation is normal. CN XI: Head turning and shoulder shrug are intact   MOTOR: Mild spasticity of bilateral lower extremity mild bilateral ankle dorsiflexion weakness  REFLEXES: Reflexes are  1 and symmetric at the biceps, triceps, knees and ankles. Plantar responses are flexor.  SENSORY: Intact to light touch, pinprick, positional and vibratory sensation at fingers and toes.  COORDINATION: There is no trunk or limb ataxia.    GAIT/STANCE: She needs push-up to get up from seated position, wide-based, unsteady cautious gait  DIAGNOSTIC DATA (LABS, IMAGING, TESTING) - I reviewed patient records, labs, notes, testing and imaging myself where available.  Lab Results  Component Value Date  WBC 9.0 08/13/2020   HGB 12.9 08/13/2020   HCT 40.4 08/13/2020   MCV 99.0 08/13/2020   PLT 247 08/13/2020      Component Value Date/Time   NA 137 11/16/2020 1500   NA 140 02/06/2017 1126   K 4.5 11/16/2020 1500   CL 101 11/16/2020 1500   CO2 27 11/16/2020 1500   GLUCOSE 103 (H) 11/16/2020 1500   BUN 31 (H) 11/16/2020 1500   BUN 22 02/06/2017 1126   CREATININE 0.91 11/16/2020 1500   CREATININE 1.00 09/18/2019 0814   CREATININE 0.85 11/06/2013 0840   CALCIUM 10.7 (H) 11/16/2020 1500   PROT 7.3  08/13/2020 1321   ALBUMIN 4.5 08/13/2020 1321   AST 31 08/13/2020 1321   AST 23 09/18/2019 0814   ALT 23 08/13/2020 1321   ALT 15 09/18/2019 0814   ALKPHOS 30 (L) 08/13/2020 1321   BILITOT 0.9 08/13/2020 1321   BILITOT 0.6 09/18/2019 0814   GFRNONAA >60 08/13/2020 1321   GFRNONAA 57 (L) 09/18/2019 0814   GFRAA >60 09/18/2019 0814   No results found for: CHOL, HDL, LDLCALC, LDLDIRECT, TRIG, CHOLHDL No results found for: VCBS4H Lab Results  Component Value Date   VITAMINB12 1,190 (H) 03/10/2016   Lab Results  Component Value Date   TSH 2.21 08/21/2018    Levert Feinstein, M.D. Ph.D.  [facility] [address] [place], [place] [address] Phone: [phone] Fax:      [phone]

## 2020-11-17 NOTE — Addendum Note (Signed)
Addended by: Levert Feinstein on: 11/17/2020 05:59 PM   Modules accepted: Level of Service

## 2020-11-18 DIAGNOSIS — E559 Vitamin D deficiency, unspecified: Secondary | ICD-10-CM | POA: Diagnosis not present

## 2020-11-18 DIAGNOSIS — M810 Age-related osteoporosis without current pathological fracture: Secondary | ICD-10-CM | POA: Diagnosis not present

## 2020-11-19 ENCOUNTER — Ambulatory Visit (INDEPENDENT_AMBULATORY_CARE_PROVIDER_SITE_OTHER): Payer: PPO | Admitting: Endocrinology

## 2020-11-19 ENCOUNTER — Other Ambulatory Visit: Payer: Self-pay

## 2020-11-19 VITALS — BP 122/64 | HR 66 | Ht 60.0 in | Wt 112.0 lb

## 2020-11-19 DIAGNOSIS — I951 Orthostatic hypotension: Secondary | ICD-10-CM | POA: Diagnosis not present

## 2020-11-19 DIAGNOSIS — E213 Hyperparathyroidism, unspecified: Secondary | ICD-10-CM | POA: Diagnosis not present

## 2020-11-19 DIAGNOSIS — E2749 Other adrenocortical insufficiency: Secondary | ICD-10-CM | POA: Diagnosis not present

## 2020-11-19 NOTE — Progress Notes (Signed)
Patient ID: Katrina Rivera, female   DOB: 08/22/1945, 75 y.o.   MRN: [id number]   Subjective:      Chief complaint: Follow-up of various issues   PROBLEM 1: Dysautonomic orthostatic hypotension   PAST history: She has had long-standing problems with orthostatic hypotension and also hyponatremia. Has been diagnosed with dysautonomia and has multiple other problems related to autonomic neuropathy.   She has been on Florinef since 2004 previously taking 3 tablets daily along with 5 tablets of potassium which had previously controlled her symptoms well . Because of persistent orthostatic symptoms she had been tried on midodrine on 05/28/12 but this was later stopped when blood pressure increased.  She  had medication adjustments done frequently over the last year for regulating her blood pressure.  RECENT history:   She has  been on variable doses of Florinef long-term, as much as 3 a day previously,   Now taking only 1 tablet 3 days a week  She is checking her blood pressure periodically with off brand meter  This appears to be reading higher than the office reading today   She has kept a record of her blood pressure readings but not clear of the dates Has not checked her readings in about a week  Standing blood pressures more recently have been as low as 97 systolic and usually between 100-110 the last 5 times  However she said that she feels lightheaded only about once a week on an average She has not needed any extra fludrocortisone  Has been able to maintain normal electrolytes and not on potassium supplements    Lab Results  Component Value Date   CREATININE 0.91 11/16/2020   BUN 31 (H) 11/16/2020   NA 137 11/16/2020   K 4.5 11/16/2020   CL 101 11/16/2020   CO2 27 11/16/2020     Adrenal Insufficiency:   This is secondary to pituitary dysfunction  Prior testing included Cortrosyn test showing stimulated level of 15.7, baseline 3.9.  Also confirmed by 24  hour urine free cortisol which was only 3.0.  Although previously she had tolerated oral hydrocortisone and small doses with improvement in her symptoms she did not continue this long-term She was again symptomatic with weight loss, decreased appetite, nausea and also diarrhea Cortrosyn stimulation test on 03/12/14 showed baseline cortisol level of 0.3 and post injection of 1.1 only  She had GI side effects from hydrocortisone and prednisone and was in the past using hydrocortisone injections using insulin syringe   RECENT HISTORY:  She feels well on methylprednisolone without any nausea or decreased appetite/weight loss She is taking 4 mg in the early morning and half tablet in the late afternoon and is consistent with this Did not feel as well when she tried to reduce the dose  Also is aware of needing to take stress doses when she has acute illness  Wt Readings from Last 3 Encounters:  11/19/20 112 lb (50.8 kg)  11/17/20 112 lb (50.8 kg)  08/24/20 116 lb (52.6 kg)     General Endocrinology:   HYPERCALCEMIA:  She has had intermittent hypercalcemia since at least 2018, previously only temporarily associated with renal dysfunction Her PTH level was 31 previously and subsequently 23 and 38 1, 25 vitamin D level is normal  However her calcium is variable, generally upper normal  She is not on calcium supplements  She does have osteoporosis as of 2016, taking Prolia from orthopedic surgeon every 6 months She was told by  orthopedic surgeon to take vitamin D   Last bone density in 2020 showed improved BMD at the hip and spine, still has relatively low levels at the wrist  Lab Results  Component Value Date   PTH 38 06/01/2020   PTH Comment 06/01/2020   CALCIUM 10.7 (H) 11/16/2020   PHOS 1.9 (L) 07/23/2006     Allergies as of 11/19/2020       Reactions   Aspirin    bruising   Azithromycin    Other reaction(s): stomach upset   Doxycycline Hyclate    Other reaction(s):  GI upset   Buspirone Hcl Palpitations   Mirtazapine Palpitations        Medication List        Accurate as of November 19, 2020 10:28 AM. If you have any questions, ask your nurse or doctor.          acetaminophen 500 MG tablet Commonly known as: TYLENOL Take 1,000 mg by mouth at bedtime.   acidophilus Caps capsule Take 1 capsule by mouth in the morning.   alfuzosin 10 MG 24 hr tablet Commonly known as: UROXATRAL Take 10 mg by mouth daily with breakfast.   celecoxib 200 MG capsule Commonly known as: CELEBREX Take 200 mg by mouth daily.   cephALEXin 250 MG capsule Commonly known as: KEFLEX Take 250 mg by mouth every other day.   CRANBERRY PO Take 2 tablets by mouth in the morning.   dicyclomine 10 MG capsule Commonly known as: BENTYL Take 10 mg by mouth at bedtime.   docusate sodium 100 MG capsule Commonly known as: Colace 1 tablet twice a day while on narcotics  STOOL SOFTENER   DULoxetine 30 MG capsule Commonly known as: Cymbalta Take 1 capsule (30 mg total) by mouth daily.   Ferrous Sulfate Dried 200 (65 Fe) MG Tabs Take 65 mg by mouth in the morning.   fludrocortisone 0.1 MG tablet Commonly known as: FLORINEF Take 1 tablet (0.1 mg total) by mouth See admin instructions. Take 0.1 mg by mouth on Tuesday, Thursday and Saturday   fluticasone 50 MCG/ACT nasal spray Commonly known as: FLONASE Place 1 spray into both nostrils daily as needed for allergies or rhinitis.   gabapentin 100 MG capsule Commonly known as: NEURONTIN Take 1 capsule twice daily as needed for breakthrough pain   Horizant 600 MG Tbcr Generic drug: Gabapentin Enacarbil Take 1 tablet (600 mg total) by mouth at bedtime.   methylPREDNISolone 4 MG tablet Commonly known as: MEDROL TAKE 1 TABLET (4MG ) AT BREAKFAST AND 1/2 TABLET (2MG ) AT 4PM.   mirtazapine 15 MG tablet Commonly known as: REMERON Take 15 mg by mouth at bedtime.   multivitamin tablet Take 1 tablet by mouth in the  morning.   OVER THE COUNTER MEDICATION Take 1 capsule by mouth at bedtime. Sleep 3   polyethylene glycol 17 g packet Commonly known as: MiraLax Take 17 g by mouth 2 (two) times daily. 17 grams in 6 oz of favorite drink twice a day until bowel movement.  LAXITIVE.  Restart if two days since last bowel movement   SYSTANE OP Place 2 drops into both eyes daily as needed (for dry eyes).   VITAMIN B COMPLEX PO Take 1 capsule by mouth in the morning.   VITAMIN C PO Take 1 tablet by mouth daily.   VITAMIN D3 PO Take 1 tablet by mouth in the morning.        Review of Systems    Genitourinary: She  has had difficulty emptying her bladder and is being treated by urologist   OSTEOPOROSIS: This is being treated by her orthopedic surgeon with Prolia  Previous bone density in 2016 showed the following: AP LUMBAR SPINE L1 through L4 Young Adult T-Score:  -2.4 LEFT FEMUR NECK: Young Adult T-Score: -3.1  Last bone density in 12/20 showed improvement in her T-scores up to -2.7 at the right femur and 3.6radius Previously in 12/2016 has T score of -3.3 at the dual femur neck right and -2.3 for the L1-L3  Vitamin D deficiency: She is on 1000 units vitamin D3      Objective:   Physical Exam  BP (!) 142/72   Pulse 66   Ht 5' (1.524 m)   Wt 112 lb (50.8 kg)   SpO2 98%   BMI 21.87 kg/m   No leg edema   Assessment:        Orthostatic dysautonomic hypotension syndrome:   Her orthostatic hypotension has been overall well controlled Since 4/25 has been taking 0.1 mg Florinef every other day As above blood pressure in the office is fairly good although at home she had relatively low readings a week ago  However she does not report lightheadedness more than about once a week  Her home blood pressure monitor appears to be reading higher than the office reading At 155/89  Adrenal insufficiency:  She has long-standing secondary adrenal insufficiency with fairly typical symptoms at  baseline and very low cortisol levels on the stimulation test Symptoms consistently well controlled on methylprednisolone 4 mg in the morning and 2 mg in the evening She will continue the same doses    Hypercalcemia: Calcium is slightly higher and likely has mild hyperparathyroidism   Explained to her the nature of hyperparathyroidism and effects on bone metabolism, no history of nephrolithiasis  She is being treated for osteoporosis with Prolia by her orthopedic specialist and appears to be benefiting from this, likely needs another bone density in December from her orthopedic surgeon    Plan:        Continue Florinef 0.1 mg 3 days a week  However she can take an extra tablet if her blood pressure is unusually low in between No change in methylprednisolone also Reminded her to take supplemental doses when she has an acute illness  She will take her blood pressure monitor for comparison to her PCP but recommended taking an [employer] monitor for now   Reather Littler  Note: This office note was prepared with Insurance underwriter. Any transcriptional errors that result from this process are unintentional.

## 2020-11-20 DIAGNOSIS — N281 Cyst of kidney, acquired: Secondary | ICD-10-CM | POA: Diagnosis not present

## 2020-11-20 DIAGNOSIS — N319 Neuromuscular dysfunction of bladder, unspecified: Secondary | ICD-10-CM | POA: Diagnosis not present

## 2020-11-23 DIAGNOSIS — M1712 Unilateral primary osteoarthritis, left knee: Secondary | ICD-10-CM | POA: Diagnosis not present

## 2020-11-26 DIAGNOSIS — G259 Extrapyramidal and movement disorder, unspecified: Secondary | ICD-10-CM | POA: Diagnosis not present

## 2020-11-26 DIAGNOSIS — R110 Nausea: Secondary | ICD-10-CM | POA: Diagnosis not present

## 2020-11-26 DIAGNOSIS — I10 Essential (primary) hypertension: Secondary | ICD-10-CM | POA: Diagnosis not present

## 2020-11-26 DIAGNOSIS — F419 Anxiety disorder, unspecified: Secondary | ICD-10-CM | POA: Diagnosis not present

## 2020-11-26 DIAGNOSIS — E2740 Unspecified adrenocortical insufficiency: Secondary | ICD-10-CM | POA: Diagnosis not present

## 2020-11-26 DIAGNOSIS — M797 Fibromyalgia: Secondary | ICD-10-CM | POA: Diagnosis not present

## 2020-11-26 DIAGNOSIS — F5101 Primary insomnia: Secondary | ICD-10-CM | POA: Diagnosis not present

## 2020-11-26 DIAGNOSIS — Z23 Encounter for immunization: Secondary | ICD-10-CM | POA: Diagnosis not present

## 2020-11-26 DIAGNOSIS — G35 Multiple sclerosis: Secondary | ICD-10-CM | POA: Diagnosis not present

## 2020-12-01 ENCOUNTER — Telehealth: Payer: Self-pay | Admitting: Neurology

## 2020-12-01 DIAGNOSIS — G35 Multiple sclerosis: Secondary | ICD-10-CM

## 2020-12-01 DIAGNOSIS — R269 Unspecified abnormalities of gait and mobility: Secondary | ICD-10-CM

## 2020-12-01 NOTE — Telephone Encounter (Signed)
Pt called, would like a call from the nurse to discuss a brace for drop foot and foot freezing.

## 2020-12-01 NOTE — Telephone Encounter (Signed)
I saw her recently, she had long history of relapsing remitting multiple sclerosis, gait abnormality, recent right knee replacement surgery, previous MRI showed cervical, and supratentorium involvement,  Also had a long history of low back pain,  No longer candidate of MRI due to spinal stimulator placement in 2018  Her abnormality are multifactorial, physical therapy blood work with her closely would be the best one to recommend the ankle brace,

## 2020-12-01 NOTE — Telephone Encounter (Signed)
I called patient. She currently uses an ankle brace but she is still complaining of bilateral foot drop that causes her issues when walking. She is finishing up PT s/p R knee surgery. She is wondering if Dr. Terrace Arabia recommends better braces for her foot drop.

## 2020-12-01 NOTE — Addendum Note (Signed)
Addended by: Geronimo Running A on: 12/01/2020 03:40 PM   Modules accepted: Orders

## 2020-12-01 NOTE — Telephone Encounter (Signed)
I called patient. I discussed this with her. She asked for a referral to Neuro Rehab at [facility] for consideration of foot braces for bilateral foot drop. Her current PT is focused on her knee replacement through [facility].  I spoke with Dr. Terrace Arabia. She is agreeable to a Neuro Rehab referral. Order placed.  I called patient. She will call Neuro Rehab at [phone] to schedule her appointments.

## 2020-12-08 DIAGNOSIS — M25662 Stiffness of left knee, not elsewhere classified: Secondary | ICD-10-CM | POA: Diagnosis not present

## 2020-12-08 DIAGNOSIS — Z96652 Presence of left artificial knee joint: Secondary | ICD-10-CM | POA: Diagnosis not present

## 2020-12-09 DIAGNOSIS — H04123 Dry eye syndrome of bilateral lacrimal glands: Secondary | ICD-10-CM | POA: Diagnosis not present

## 2020-12-09 DIAGNOSIS — Z961 Presence of intraocular lens: Secondary | ICD-10-CM | POA: Diagnosis not present

## 2020-12-09 DIAGNOSIS — H40013 Open angle with borderline findings, low risk, bilateral: Secondary | ICD-10-CM | POA: Diagnosis not present

## 2020-12-09 DIAGNOSIS — H35371 Puckering of macula, right eye: Secondary | ICD-10-CM | POA: Diagnosis not present

## 2020-12-17 ENCOUNTER — Other Ambulatory Visit: Payer: Self-pay

## 2020-12-17 ENCOUNTER — Ambulatory Visit: Payer: PPO | Attending: Neurology

## 2020-12-17 DIAGNOSIS — R2689 Other abnormalities of gait and mobility: Secondary | ICD-10-CM | POA: Diagnosis not present

## 2020-12-17 DIAGNOSIS — M6281 Muscle weakness (generalized): Secondary | ICD-10-CM | POA: Diagnosis not present

## 2020-12-17 DIAGNOSIS — G35 Multiple sclerosis: Secondary | ICD-10-CM | POA: Diagnosis not present

## 2020-12-17 DIAGNOSIS — R2681 Unsteadiness on feet: Secondary | ICD-10-CM

## 2020-12-17 DIAGNOSIS — R269 Unspecified abnormalities of gait and mobility: Secondary | ICD-10-CM | POA: Insufficient documentation

## 2020-12-17 DIAGNOSIS — R262 Difficulty in walking, not elsewhere classified: Secondary | ICD-10-CM

## 2020-12-17 NOTE — Therapy (Signed)
[facility] [address] [place], [place], [address] Phone: [phone]   Fax:  [phone]  Physical Therapy Evaluation  Patient Details  Name: Katrina Rivera MRN: [id number] Date of Birth: 20-May-1945 Referring Provider (PT): Levert Feinstein, MD   Encounter Date: 12/17/2020   PT End of Session - 12/17/20 0929     Visit Number 1    Number of Visits 9    Date for PT Re-Evaluation 02/19/21    Authorization Type HTA (10th Visit PN)    Progress Note Due on Visit 10    PT Start Time 0930    PT Stop Time 1011    PT Time Calculation (min) 41 min    Activity Tolerance Patient tolerated treatment well    Behavior During Therapy [facility] for tasks assessed/performed             Past Medical History:  Diagnosis Date   Addison's disease (HCC)    takes Solu Cortef daily   Anemia    takes Ferrous Sulfate daily   Anxiety    takes Xanax nightly   Arthritis    Chronic back pain    stenosis   Depression    takes Cymbalta daily   Dizziness    if b/p drops    Fibromyalgia    Fibromyalgia    History of blood transfusion    no abnormal reaction noted   History of bronchitis    many yrs ago    Hypokalemia    takes Potassium daily   Hypotension    Hypotension    takes Florinef daily   IBS (irritable bowel syndrome)    takes Align daily   Insomnia    takes Trazodone nightly   Joint pain    Multiple sclerosis (HCC)    doesn't take any meds   Multiple sclerosis (HCC)    Nocturia    Osteoporosis    Peripheral neuropathy    Primary localized osteoarthritis of left knee 08/11/2020   Restless leg syndrome    Seasonal allergies    takes Zyrtec daily;uses Flonase daily as needed   Syncope    Urinary frequency    takes Flomax daily   Weakness    numbness and tingling    Past Surgical History:  Procedure Laterality Date   ABDOMINAL HYSTERECTOMY  02/07/1986   APPENDECTOMY  02/07/1986   CESAREAN SECTION  1973/1977    x2   CHOLECYSTECTOMY  02/08/1995   COLECTOMY  02/08/1988   COLONOSCOPY     ESOPHAGOGASTRODUODENOSCOPY     EYE SURGERY     bilateral - /w IOL   FRACTURE SURGERY Right    rods and screws   LUMBAR LAMINECTOMY/DECOMPRESSION MICRODISCECTOMY Left 01/24/2013   Procedure: LUMBAR FIVE TO SACRAL ONE LUMBAR LAMINECTOMY/DECOMPRESSION MICRODISCECTOMY 1 LEVEL;  Surgeon: Tia Alert, MD;  Location: [facility];  Service: Neurosurgery;  Laterality: Left;   MAXIMUM ACCESS (MAS)POSTERIOR LUMBAR INTERBODY FUSION (PLIF) 1 LEVEL N/A 06/18/2014   Procedure: MAXIMUM ACCESS SURGERY POSTERIOR LUMBAR INTERBODY FUSION LUMBAR FIVE TO SACRAL ONE ;  Surgeon: Tia Alert, MD;  Location: [facility];  Service: Neurosurgery;  Laterality: N/A;   SPINAL CORD STIMULATOR IMPLANT     TOTAL KNEE ARTHROPLASTY Left 08/24/2020   Procedure: TOTAL KNEE ARTHROPLASTY;  Surgeon: Salvatore Marvel, MD;  Location: [facility];  Service: Orthopedics;  Laterality: Left;    There were no vitals filed for this visit.    Subjective Assessment - 12/17/20 0932  Subjective Patient reports she had her L knee replaced in July. She is doing well with bending the knee, but having difficulty with straightening out. 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 Patient has finished therapy for the knee at this time. Reports she is still utilizing a cane for balance. Reports she also feels as she is having freezing episodes, reports it only occurs out in the community. No falls, but has had a few stumbles.    Pertinent History Addison's Disease, Anxiety, Chronic Back Pain, Fibromyalgia, Hypotension, IBS, MS, Osteoporosis, Peripheral Neuropathy, OA, Syncope, Spinal Stimulator    Limitations Walking;House hold activities;Standing    Patient Stated Goals improve the gait/balance    Currently in Pain? No/denies                [facility] PT Assessment - 12/17/20 0001       Assessment   Medical Diagnosis Multiple Sclerosis     Referring Provider (PT) Levert Feinstein, MD    Onset Date/Surgical Date 12/01/20   referral date   Hand Dominance Right    Prior Therapy Prior PT at this facility; PT after knee replacement      Precautions   Precautions Fall;Other (comment)    Precaution Comments Spinal Stimulator. PMH: Addison's Disease, Anxiety, Chronic Back Pain, Fibromyalgia, Hypotension, IBS, MS, Osteoporosis, Peripheral Neuropathy, OA, Syncope.      Restrictions   Weight Bearing Restrictions No      Balance Screen   Has the patient fallen in the past 6 months No    Has the patient had a decrease in activity level because of a fear of falling?  Yes    Is the patient reluctant to leave their home because of a fear of falling?  Yes      Home Environment   Living Environment Private residence    Living Arrangements Spouse/significant other    Available Help at Discharge Family    Type of Home House    Home Access Stairs to enter    Entrance Stairs-Number of Steps 1   single step up into house   Home Layout One level    Home Equipment Walker - 2 wheels;Cane - single point;Walker - 4 wheels      Prior Function   Level of Independence Independent with household mobility without device;Independent with community mobility with device    Vocation Retired      Copy Status Within Functional Limits for tasks assessed      Observation/Other Assessments-Edema    Edema --   mild edema noted in L Knee     Sensation   Light Touch Impaired by gross assessment    Additional Comments history of peripheral neuropathy bilaterally      Coordination   Gross Motor Movements are Fluid and Coordinated No      ROM / Strength   AROM / PROM / Strength AROM;Strength      AROM   Overall AROM  Deficits    Overall AROM Comments Deficits in L Knee Extension: -7 degs from neutral      Strength   Overall Strength Deficits    Strength Assessment Site Hip;Knee;Ankle    Right/Left Hip Right;Left    Right Hip  Flexion 4-/5    Right Hip ABduction 3+/5    Right Hip ADduction 4-/5    Left Hip Flexion 4/5    Left Hip ABduction 3+/5    Left Hip ADduction 4-/5  Right/Left Knee Right;Left    Right Knee Flexion 4/5    Right Knee Extension 4-/5    Left Knee Flexion 4/5    Left Knee Extension 3+/5    Right/Left Ankle Left;Right    Right Ankle Dorsiflexion 3+/5    Left Ankle Dorsiflexion 3+/5      Bed Mobility   Bed Mobility --   independent     Transfers   Transfers Sit to Stand;Stand to Sit    Sit to Stand 5: Supervision    Five time sit to stand comments  14.28 seconds with no UE support    Stand to Sit 5: Supervision      Ambulation/Gait   Ambulation/Gait Yes    Ambulation/Gait Assistance 5: Supervision    Ambulation/Gait Assistance Details ambulaton x 115 ft with [facility], patient demo proper sequencing. flexed knee noted on L with stance phase due to ROM limitations. Trialed without SPC to simulate household mobility with no significant changes x 115 ft. Improved knee extension with cues.Pt reported intermittent freezing episodes in community, but none noted throughout evaluation. Pt requesting information on AFO options with PT providing extensive education on these options and plan to trial at future sessions to determine best option for patient presentation.    Ambulation Distance (Feet) 230 Feet    Assistive device Straight cane;None   bilateral foot up brace   Gait Pattern Step-through pattern;Decreased step length - right;Decreased step length - left;Decreased stance time - left;Decreased hip/knee flexion - left;Decreased dorsiflexion - left;Decreased dorsiflexion - right;Poor foot clearance - left;Poor foot clearance - right;Left flexed knee in stance    Ambulation Surface Level;Indoor    Gait velocity 13.42 seconds = 2.44 ft/sec                        Objective measurements completed on examination: See above findings.                PT Education - 12/17/20  0930     Education Details Educated on POC/eval findings; AFO trial options    Person(s) Educated Patient    Methods Explanation    Comprehension Verbalized understanding              PT Short Term Goals - 12/17/20 1021       PT SHORT TERM GOAL #1   Title Patient will report independence with initial HEP for balance and walking program >/=3x/week (ALL LTGs Due: 01/15/21)    Baseline no HEP established    Time 4    Period Weeks    Status New    Target Date 01/15/21      PT SHORT TERM GOAL #2   Title Patient will be able to ascend/descend incline and single step Mod with no AD vs. LRAD    Baseline TBA    Time 4    Period Weeks    Status New      PT SHORT TERM GOAL #3   Title Patient will undergo further assesment of gait with AFO and begin process of obtaining personal bilateral AFO    Baseline no AFO currently    Time 4    Period Weeks    Status New      PT SHORT TERM GOAL #4   Title Paitent will improve gait speed to >/= 2.5 ft/seconds to dempo improved community mobility    Baseline 2.44 ft/sec    Time 4    Period Weeks    Status  New               PT Long Term Goals - 12/17/20 1023       PT LONG TERM GOAL #1   Title Pt will be demonstrate independence with advanced balance HEP/walking program and improved compliance (>/= 4x/week). (ALL LTGs Due: 02/19/21)    Baseline no HEP established    Time 8    Period Weeks    Status New    Target Date 02/19/21      PT LONG TERM GOAL #2   Title Pt will improve her DGI score to at least 19/24 to demo decreased fall risk    Baseline TBA    Time 8    Period Weeks    Status New      PT LONG TERM GOAL #3   Title Pt will improve gait speed to at least 2.65 ft/second to demo improved community mobility    Baseline 2.44 ft/sec    Time 8    Period Weeks    Status New      PT LONG TERM GOAL #4   Title Patient will improve 5x sit <> stand to </= 12 seconds to demo reduced fall risk    Baseline 14.28 secs    Time  8    Period Weeks    Status New      PT LONG TERM GOAL #5   Title Patient will be able to ambulate >/= 1000 ft outdoors Mod I with no AD vs. LRAD for improved community mobility    Baseline TBA    Time 8    Period Weeks    Status New                    Plan - 12/17/20 1029     Clinical Impression Statement Patient is a 75 y.o. female referred to Neuro OPPT services for Multiple Sclerosis. Patient's PMH significant for the following: Addison's Disease, Anxiety, Chronic Back Pain, Fibromyalgia, Hypotension, IBS, MS, Osteoporosis, Peripheral Neuropathy, OA, Syncope, Spinal Stimulator. Patient presents with the following impariments upon evaluation: decreased strength, impaired balance, decreased ROM in L knee, bilateral foot drop with abnormal gait pattern, impaired sensation, and increased fall risk. Patient is currently using bilateral foot up braces, but demo need for AFO and will need ot be further assessed for this at future session. Patient is currently ambulating at 2.44 ft/sec with [facility] demo limited community ambulator. Patient is at fall risk with 5x sit <> stand time of 2.44 ft/second. Patient will benefit from skilled PT services to address impairments and maxmize functional mobility.    Personal Factors and Comorbidities Comorbidity 3+;Time since onset of injury/illness/exacerbation;Age    Comorbidities Addison's Disease, Anxiety, Chronic Back Pain, Fibromyalgia, Hypotension, IBS, MS, Osteoporosis, Peripheral Neuropathy, OA, Syncope, Spinal Stimulator    Examination-Activity Limitations Bend;Lift;Stairs;Stand;Locomotion Level    Examination-Participation Restrictions Cleaning;Community Activity;Yard Work    Stability/Clinical Decision Making Stable/Uncomplicated    Optometrist Low    Rehab Potential Good    PT Frequency 1x / week    PT Duration 8 weeks    PT Treatment/Interventions ADLs/Self Care Home Management;Cryotherapy;Moist Heat;Aquatic Therapy;DME  Instruction;Gait training;Stair training;Functional mobility training;Therapeutic activities;Therapeutic exercise;Balance training;Neuromuscular re-education;Patient/family education;Orthotic Fit/Training;Manual techniques;Passive range of motion;Vestibular    PT Next Visit Plan Assess DGI. Initiate HEP. Assess gait with AFO    Consulted and Agree with Plan of Care Patient             Patient will benefit  from skilled therapeutic intervention in order to improve the following deficits and impairments:  Abnormal gait, Decreased balance, Decreased activity tolerance, Difficulty walking, Pain, Decreased strength, Decreased knowledge of use of DME, Decreased range of motion, Impaired sensation, Decreased endurance  Visit Diagnosis: Difficulty in walking, not elsewhere classified  Other abnormalities of gait and mobility  Unsteadiness on feet  Muscle weakness (generalized)     Problem List Patient Active Problem List   Diagnosis Date Noted   Primary localized osteoarthritis of left knee 08/11/2020   Atrophic vaginitis 08/11/2020   Menopausal symptom 08/11/2020   Restless leg syndrome    Chronic back pain    Low back pain without sciatica 11/01/2018   Essential hypertension    SBO (small bowel obstruction) (HCC) 09/01/2016   Fibromyalgia    Abdominal pain, vomiting, and diarrhea    Dehydration    Somnolence, daytime 10/22/2015   Fatigue 10/22/2015   Chronic leg pain 06/29/2015   S/P lumbar spinal fusion 06/18/2014   Abnormality of gait 02/18/2013   S/P lumbar microdiscectomy 01/24/2013   Hypokalemia 12/24/2012   Sweating abnormality 10/19/2012   Dysautonomia orthostatic hypotension syndrome 08/16/2012   Hereditary and idiopathic peripheral neuropathy 08/16/2012   Chronic adrenal insufficiency (HCC) 08/16/2012   MS (multiple sclerosis) (HCC) 08/15/2012    Tempie Donning, PT, DPT 12/17/2020, 10:34 AM  [facility] [address] [place], [place], [address] Phone: [phone]   Fax:  [phone]  Name: Katrina Rivera MRN: [id number] Date of Birth: 02-Oct-1945

## 2020-12-17 NOTE — Addendum Note (Signed)
Addended by: Jethro Bastos B on: 12/17/2020 10:36 AM   Modules accepted: Orders

## 2020-12-29 DIAGNOSIS — M1712 Unilateral primary osteoarthritis, left knee: Secondary | ICD-10-CM | POA: Diagnosis not present

## 2021-01-06 ENCOUNTER — Ambulatory Visit: Payer: PPO

## 2021-01-07 DIAGNOSIS — M25662 Stiffness of left knee, not elsewhere classified: Secondary | ICD-10-CM | POA: Diagnosis not present

## 2021-01-07 DIAGNOSIS — Z96652 Presence of left artificial knee joint: Secondary | ICD-10-CM | POA: Diagnosis not present

## 2021-01-11 ENCOUNTER — Ambulatory Visit: Payer: PPO

## 2021-01-11 DIAGNOSIS — R051 Acute cough: Secondary | ICD-10-CM | POA: Diagnosis not present

## 2021-01-11 DIAGNOSIS — J069 Acute upper respiratory infection, unspecified: Secondary | ICD-10-CM | POA: Diagnosis not present

## 2021-01-19 ENCOUNTER — Ambulatory Visit: Payer: PPO

## 2021-01-26 ENCOUNTER — Other Ambulatory Visit: Payer: Self-pay

## 2021-01-26 ENCOUNTER — Ambulatory Visit: Payer: PPO | Attending: Neurology

## 2021-01-26 ENCOUNTER — Telehealth: Payer: Self-pay

## 2021-01-26 ENCOUNTER — Ambulatory Visit: Payer: PPO

## 2021-01-26 DIAGNOSIS — R262 Difficulty in walking, not elsewhere classified: Secondary | ICD-10-CM | POA: Insufficient documentation

## 2021-01-26 DIAGNOSIS — G35 Multiple sclerosis: Secondary | ICD-10-CM

## 2021-01-26 DIAGNOSIS — M6281 Muscle weakness (generalized): Secondary | ICD-10-CM | POA: Insufficient documentation

## 2021-01-26 DIAGNOSIS — R2689 Other abnormalities of gait and mobility: Secondary | ICD-10-CM | POA: Insufficient documentation

## 2021-01-26 DIAGNOSIS — R2681 Unsteadiness on feet: Secondary | ICD-10-CM | POA: Insufficient documentation

## 2021-01-26 DIAGNOSIS — M1712 Unilateral primary osteoarthritis, left knee: Secondary | ICD-10-CM | POA: Diagnosis not present

## 2021-01-26 DIAGNOSIS — R269 Unspecified abnormalities of gait and mobility: Secondary | ICD-10-CM

## 2021-01-26 NOTE — Therapy (Signed)
[facility] [address] [place], [place], [address] Phone: [phone]   Fax:  [phone]  Physical Therapy Treatment  Patient Details  Name: Katrina Rivera MRN: [id number] Date of Birth: Jun 27, 1945 Referring Provider (PT): Levert Feinstein, MD   Encounter Date: 01/26/2021   PT End of Session - 01/26/21 0835     Visit Number 2    Number of Visits 9    Date for PT Re-Evaluation 02/19/21    Authorization Type HTA (10th Visit PN)    Progress Note Due on Visit 10    PT Start Time 0835    PT Stop Time 0915    PT Time Calculation (min) 40 min    Activity Tolerance Patient tolerated treatment well    Behavior During Therapy [facility] for tasks assessed/performed             Past Medical History:  Diagnosis Date   Addison's disease (HCC)    takes Solu Cortef daily   Anemia    takes Ferrous Sulfate daily   Anxiety    takes Xanax nightly   Arthritis    Chronic back pain    stenosis   Depression    takes Cymbalta daily   Dizziness    if b/p drops    Fibromyalgia    Fibromyalgia    History of blood transfusion    no abnormal reaction noted   History of bronchitis    many yrs ago    Hypokalemia    takes Potassium daily   Hypotension    Hypotension    takes Florinef daily   IBS (irritable bowel syndrome)    takes Align daily   Insomnia    takes Trazodone nightly   Joint pain    Multiple sclerosis (HCC)    doesn't take any meds   Multiple sclerosis (HCC)    Nocturia    Osteoporosis    Peripheral neuropathy    Primary localized osteoarthritis of left knee 08/11/2020   Restless leg syndrome    Seasonal allergies    takes Zyrtec daily;uses Flonase daily as needed   Syncope    Urinary frequency    takes Flomax daily   Weakness    numbness and tingling    Past Surgical History:  Procedure Laterality Date   ABDOMINAL HYSTERECTOMY  02/07/1986   APPENDECTOMY  02/07/1986   CESAREAN SECTION  1973/1977    x2   CHOLECYSTECTOMY  02/08/1995   COLECTOMY  02/08/1988   COLONOSCOPY     ESOPHAGOGASTRODUODENOSCOPY     EYE SURGERY     bilateral - /w IOL   FRACTURE SURGERY Right    rods and screws   LUMBAR LAMINECTOMY/DECOMPRESSION MICRODISCECTOMY Left 01/24/2013   Procedure: LUMBAR FIVE TO SACRAL ONE LUMBAR LAMINECTOMY/DECOMPRESSION MICRODISCECTOMY 1 LEVEL;  Surgeon: Tia Alert, MD;  Location: [facility];  Service: Neurosurgery;  Laterality: Left;   MAXIMUM ACCESS (MAS)POSTERIOR LUMBAR INTERBODY FUSION (PLIF) 1 LEVEL N/A 06/18/2014   Procedure: MAXIMUM ACCESS SURGERY POSTERIOR LUMBAR INTERBODY FUSION LUMBAR FIVE TO SACRAL ONE ;  Surgeon: Tia Alert, MD;  Location: [facility];  Service: Neurosurgery;  Laterality: N/A;   SPINAL CORD STIMULATOR IMPLANT     TOTAL KNEE ARTHROPLASTY Left 08/24/2020   Procedure: TOTAL KNEE ARTHROPLASTY;  Surgeon: Salvatore Marvel, MD;  Location: [facility];  Service: Orthopedics;  Laterality: Left;    There were no vitals filed for this visit.   Subjective Assessment - 01/26/21 6834  Subjective Patient reports she has a visit with the orthopedic MD this morning and hoping to be released by them. Patient reports had the flu, starting to feel better. Very interested in AFOs    Pertinent History Addison's Disease, Anxiety, Chronic Back Pain, Fibromyalgia, Hypotension, IBS, MS, Osteoporosis, Peripheral Neuropathy, OA, Syncope, Spinal Stimulator    Limitations Walking;House hold activities;Standing    Patient Stated Goals improve the gait/balance    Currently in Pain? Yes    Pain Score 4     Pain Location Knee    Pain Orientation Left    Pain Descriptors / Indicators Aching    Pain Type Chronic pain                [facility] PT Assessment - 01/26/21 0001       Standardized Balance Assessment   Standardized Balance Assessment Dynamic Gait Index      Dynamic Gait Index   Level Surface Mild Impairment    Change in Gait Speed Normal    Gait with Horizontal  Head Turns Mild Impairment    Gait with Vertical Head Turns Mild Impairment    Gait and Pivot Turn Normal    Step Over Obstacle Mild Impairment    Step Around Obstacles Normal    Steps Mild Impairment    Total Score 19    DGI comment: 19/24               [facility] Adult PT Treatment/Exercise - 01/26/21 0001       Transfers   Transfers Sit to Stand;Stand to Sit    Sit to Stand 6: Modified independent (Device/Increase time)    Stand to Sit 6: Modified independent (Device/Increase time)      Ambulation/Gait   Ambulation/Gait Yes    Ambulation/Gait Assistance 5: Supervision    Ambulation/Gait Assistance Details completed ambulation x 400 ft with bil AFOs (walk on ottobok) vs foot up brace. Patient demonstrating improved clearance of bil LE with AFOs, and improved heel strike noted. Pt continue to demo decreased stance on LLE due to recent knee replacement, and lack of full extension ROM affecting gait. Patient also verbalizing improvement in ambulation with AFO donned and interested in these for ambulation. Denies any skin issues/pain with braces donned. Followed with ambulation of foot up brace, and improvements noted. PT educating on process of obtaining AFOs. Will plan to place PT services on hold.    Ambulation Distance (Feet) 400 Feet    Assistive device 4-wheeled walker    Gait Pattern Step-through pattern;Decreased step length - right;Decreased step length - left;Decreased stance time - left;Decreased hip/knee flexion - left;Decreased dorsiflexion - left;Decreased dorsiflexion - right;Poor foot clearance - left;Poor foot clearance - right;Left flexed knee in stance    Ambulation Surface Level;Indoor    Stairs Yes    Stairs Assistance 6: Modified independent (Device/Increase time)    Stair Management Technique Two rails;Alternating pattern;Forwards    Number of Stairs 12    Height of Stairs 6    Gait Comments completed negotiation of stairs with bil AFOs donned.                 PT Education - 01/26/21 0913     Education Details [facility] Information; Process of obtaining AFOs    Person(s) Educated Patient    Methods Explanation    Comprehension Verbalized understanding              PT Short Term Goals - 01/26/21 [phone]  PT SHORT TERM GOAL #1   Title Patient will report independence with initial HEP for balance and walking program >/=3x/week (ALL LTGs Due: 01/15/21)    Baseline no HEP established    Time 4    Period Weeks    Status New    Target Date 01/15/21      PT SHORT TERM GOAL #2   Title Patient will be able to ascend/descend incline and single step Mod with no AD vs. LRAD    Baseline TBA    Time 4    Period Weeks    Status New      PT SHORT TERM GOAL #3   Title Patient will undergo further assesment of gait with AFO and begin process of obtaining personal bilateral AFO    Baseline no AFO currently; began process of obtaining AFOs    Time 4    Period Weeks    Status Achieved      PT SHORT TERM GOAL #4   Title Paitent will improve gait speed to >/= 2.5 ft/seconds to dempo improved community mobility    Baseline 2.44 ft/sec    Time 4    Period Weeks    Status New               PT Long Term Goals - 12/17/20 1023       PT LONG TERM GOAL #1   Title Pt will be demonstrate independence with advanced balance HEP/walking program and improved compliance (>/= 4x/week). (ALL LTGs Due: 02/19/21)    Baseline no HEP established    Time 8    Period Weeks    Status New    Target Date 02/19/21      PT LONG TERM GOAL #2   Title Pt will improve her DGI score to at least 19/24 to demo decreased fall risk    Baseline TBA    Time 8    Period Weeks    Status New      PT LONG TERM GOAL #3   Title Pt will improve gait speed to at least 2.65 ft/second to demo improved community mobility    Baseline 2.44 ft/sec    Time 8    Period Weeks    Status New      PT LONG TERM GOAL #4   Title Patient will improve 5x sit <> stand  to </= 12 seconds to demo reduced fall risk    Baseline 14.28 secs    Time 8    Period Weeks    Status New      PT LONG TERM GOAL #5   Title Patient will be able to ambulate >/= 1000 ft outdoors Mod I with no AD vs. LRAD for improved community mobility    Baseline TBA    Time 8    Period Weeks    Status New                   Plan - 01/26/21 6767     Clinical Impression Statement Patient returns to PT services, assessed DGI with patient scoring 19/24 indicating medium fall risk. Rest of session spent on gait assesment with bilateral AFOs. Patient demonstrating improved gait pattern, including foot clearance and heel strike with AFOs donned. As well as improved stair negotiation. Pt is interested in obtaining bil AFOs. Primary PT to obtain order and begin process. Will place Pt on hold until personal AFOs are recieved.    Personal Factors and Comorbidities Comorbidity 3+;Time since  onset of injury/illness/exacerbation;Age    Comorbidities Addison's Disease, Anxiety, Chronic Back Pain, Fibromyalgia, Hypotension, IBS, MS, Osteoporosis, Peripheral Neuropathy, OA, Syncope, Spinal Stimulator    Examination-Activity Limitations Bend;Lift;Stairs;Stand;Locomotion Level    Examination-Participation Restrictions Cleaning;Community Activity;Yard Work    Stability/Clinical Decision Making Stable/Uncomplicated    Rehab Potential Good    PT Frequency 1x / week    PT Duration 8 weeks    PT Treatment/Interventions ADLs/Self Care Home Management;Cryotherapy;Moist Heat;Aquatic Therapy;DME Instruction;Gait training;Stair training;Functional mobility training;Therapeutic activities;Therapeutic exercise;Balance training;Neuromuscular re-education;Patient/family education;Orthotic Fit/Training;Manual techniques;Passive range of motion;Vestibular    PT Next Visit Plan Did we recieve AFOs? continue balance/BLE strengthening. update HEP    Consulted and Agree with Plan of Care Patient              Patient will benefit from skilled therapeutic intervention in order to improve the following deficits and impairments:  Abnormal gait, Decreased balance, Decreased activity tolerance, Difficulty walking, Pain, Decreased strength, Decreased knowledge of use of DME, Decreased range of motion, Impaired sensation, Decreased endurance  Visit Diagnosis: Difficulty in walking, not elsewhere classified  Other abnormalities of gait and mobility  Unsteadiness on feet  Muscle weakness (generalized)     Problem List Patient Active Problem List   Diagnosis Date Noted   Primary localized osteoarthritis of left knee 08/11/2020   Atrophic vaginitis 08/11/2020   Menopausal symptom 08/11/2020   Restless leg syndrome    Chronic back pain    Low back pain without sciatica 11/01/2018   Essential hypertension    SBO (small bowel obstruction) (HCC) 09/01/2016   Fibromyalgia    Abdominal pain, vomiting, and diarrhea    Dehydration    Somnolence, daytime 10/22/2015   Fatigue 10/22/2015   Chronic leg pain 06/29/2015   S/P lumbar spinal fusion 06/18/2014   Abnormality of gait 02/18/2013   S/P lumbar microdiscectomy 01/24/2013   Hypokalemia 12/24/2012   Sweating abnormality 10/19/2012   Dysautonomia orthostatic hypotension syndrome 08/16/2012   Hereditary and idiopathic peripheral neuropathy 08/16/2012   Chronic adrenal insufficiency (HCC) 08/16/2012   MS (multiple sclerosis) (HCC) 08/15/2012    Tempie Donning, PT, DPT 01/26/2021, 10:42 AM  [facility] [address] [place], [place], [address] Phone: [phone]   Fax:  [phone]  Name: Katrina Rivera MRN: [id number] Date of Birth: [birth year]

## 2021-01-26 NOTE — Patient Instructions (Signed)
CONTACT [address] [place], [place] [address] Phone: [phone] Fax: [phone]  HOURS Monday - Friday, 8 a.m. - 5 p.m

## 2021-01-26 NOTE — Telephone Encounter (Signed)
Per PT, the patient will require a face-to-face visit for the AFO braces. I spoke to the patient and she has been scheduled.

## 2021-01-26 NOTE — Telephone Encounter (Signed)
Dr. Terrace Arabia,   I have sent a request for an order for Bilateral AFOs for Ms. Pousson. For insurance coverage, this patient will also need require a face to face visit stating the medical need for bilateral AFOs. If you are agreeable, can you please have your office get this visit scheduled for the patient.   Thank you for your help. Happy Holidays.    Adelfa Koh, PT, DPT

## 2021-01-26 NOTE — Telephone Encounter (Signed)
Dr. Terrace Arabia, Doralee Albino. Katrina Rivera is being treated by physical therapy for Multiple Sclerosis and Bilateral Foot Drop.  Katrina Rivera will benefit from use of bialteral AFO in order to improve safety with functional mobility.    If you agree, please submit request in EPIC under MD Order, Other Orders (list Bilateral AFO in comments) or fax to [facility] at [phone].   Thank you, Adelfa Koh, PT, DPT   [facility] [address] [place], [place]  [address] Phone:  [phone] Fax:  [phone]

## 2021-02-03 ENCOUNTER — Ambulatory Visit: Payer: PPO

## 2021-02-07 DIAGNOSIS — M25662 Stiffness of left knee, not elsewhere classified: Secondary | ICD-10-CM | POA: Diagnosis not present

## 2021-02-07 DIAGNOSIS — Z96652 Presence of left artificial knee joint: Secondary | ICD-10-CM | POA: Diagnosis not present

## 2021-02-10 ENCOUNTER — Ambulatory Visit: Payer: PPO

## 2021-02-15 ENCOUNTER — Telehealth: Payer: Self-pay | Admitting: Neurology

## 2021-02-15 NOTE — Telephone Encounter (Signed)
I spoke to the patient. Reports worsening leg pain disrupting her sleep.  She is taking Horizant 600mg  around 5pm. She also has gabapentin 100mg , 1-2 capsules for breakthrough pain. She has not used the immediate release routinely.   She is going to move her Horizant to bedtime (between 10-11pm) to see if it will allow her to sleep through the night. She verbalized understanding that she may use the immediate release if she wakes up with pain.   If this pain does not work, she will call our office back.

## 2021-02-15 NOTE — Telephone Encounter (Signed)
Pt asking if Gabapentin Enacarbil (HORIZANT) 600 MG TBCR dosage can be increased. Leg pain and back pain is effecting pt's sleep. Medication is not working at night. Would like a call from the nurse.

## 2021-02-17 ENCOUNTER — Ambulatory Visit: Payer: PPO

## 2021-02-17 NOTE — Telephone Encounter (Signed)
Pt called has some more questions about her Gabapentin. Pt requesting a call back.

## 2021-02-17 NOTE — Telephone Encounter (Signed)
I called the pt back she sts taking the Horizant between 10-11 pm has been helpful and she gained an extra hour of sleep.  She wanted to know if it is safe for her to take the 100 mg gabapentin 2 tablets for break through pain?  I advised per the instructions given for the Gabapentin 100 mg, ok to take up to two tablets as needed for break through pain.  Pt verbalized understanding and will try this over the next few nights to see how it helps.   She will keep f/u as scheduled for 03/02/21.

## 2021-03-02 ENCOUNTER — Ambulatory Visit: Payer: PPO | Admitting: Neurology

## 2021-03-02 ENCOUNTER — Other Ambulatory Visit (INDEPENDENT_AMBULATORY_CARE_PROVIDER_SITE_OTHER): Payer: Self-pay

## 2021-03-02 ENCOUNTER — Encounter: Payer: Self-pay | Admitting: Neurology

## 2021-03-02 ENCOUNTER — Other Ambulatory Visit: Payer: Self-pay

## 2021-03-02 VITALS — BP 146/80 | HR 74 | Ht 60.0 in | Wt 116.0 lb

## 2021-03-02 DIAGNOSIS — R269 Unspecified abnormalities of gait and mobility: Secondary | ICD-10-CM

## 2021-03-02 DIAGNOSIS — G35 Multiple sclerosis: Secondary | ICD-10-CM

## 2021-03-02 DIAGNOSIS — E2749 Other adrenocortical insufficiency: Secondary | ICD-10-CM

## 2021-03-02 DIAGNOSIS — G2581 Restless legs syndrome: Secondary | ICD-10-CM | POA: Diagnosis not present

## 2021-03-02 LAB — BASIC METABOLIC PANEL
BUN: 36 mg/dL — ABNORMAL HIGH (ref 6–23)
CO2: 30 mEq/L (ref 19–32)
Calcium: 11 mg/dL — ABNORMAL HIGH (ref 8.4–10.5)
Chloride: 100 mEq/L (ref 96–112)
Creatinine, Ser: 0.95 mg/dL (ref 0.40–1.20)
GFR: 59.47 mL/min — ABNORMAL LOW (ref >60.00)
Glucose, Bld: 92 mg/dL (ref 70–99)
Potassium: 4.6 mEq/L (ref 3.5–5.1)
Sodium: 137 mEq/L (ref 135–145)

## 2021-03-02 MED ORDER — GABAPENTIN 100 MG PO CAPS: 100.0000 mg | ORAL_CAPSULE | Freq: Three times a day (TID) | ORAL | 4 refills | Status: DC

## 2021-03-02 MED ORDER — TIZANIDINE HCL 2 MG PO TABS: 2.0000 mg | ORAL_TABLET | Freq: Four times a day (QID) | ORAL | 6 refills | Status: DC | PRN

## 2021-03-02 MED ORDER — DULOXETINE HCL 60 MG PO CPEP: 60.0000 mg | ORAL_CAPSULE | Freq: Every day | ORAL | 4 refills | Status: DC

## 2021-03-02 NOTE — Progress Notes (Signed)
ASSESSMENT AND PLAN 76 y.o. year old female   Relapsing remitting multiple sclerosis -Last MRIs in 2015, evidence of supratentorium, infratentorium, spinal cord involvement -Is not on immune modulating therapy -Not MRI candidate due to spinal stimulator  Chronic low back pain, bilateral lower extremity pain, status post spinal stimulator in January 2018 Is consider switch to neuro model of pain stimulator, by January 2023 Restless leg syndrome  Previously tried and failed multiple different medications, Requip, Mirapex, Neupro,  Overall doing well, stable on Horizant 600 mg at bedtime,  Still has intermittent trouble sleeping, will increase gabapentin up to 100 mg 3 tablets every night as needed  Intermittent diffuse body achy pain  Much improved with Cymbalta 30 mg daily, tolerating it well, will increase to 60 mg daily  Gait abnormality  Difficulty picking up bilateral feet from the floor, is receiving physical therapy, reported significant improvement tried bilateral AFO, agree prescription for AFO, continue moderate exercise  She will continue follow-up with pain management and primary care physician, and return to clinic for new issues  HISTORY OF PRESENT ILLNESS: Mrs. Axton is a very pleasant 76 year-old right-handed woman, with relapsing remitting multiple sclerosis, was treated with Betaseron 4 -5 years, but could not tolerate the side effect, now is not on any immunomodulation therapy. neuropathic pain of bilateral lower extremities.   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    She has baseline gait difficulty, she fell in May 2013, fractured both elbows and her left knee cap.  She intermittently has been using a cane or walker.  She could not tolerate Requip because of nausea. She has been on gabapentin 300 mg 3 bid. She gained significant weight gain when on Lyrica, and therefore was switched back to gabapentin. She has orthostatic hypotension, and is on midodrine 2.5 mg and fludrocortisone 0.1 mg 1 in AM and 2 at night, and she is also on 20 mg of hydrocortisone.   She complains of bilateral lower extremity burning stinging sensation, getting worse when she sits still, difficulty falling into sleep, she has the urge to move because of bilateral extremity discomfort, has tried Requip, could not tolerate it because of GI side effects, Neuprol patch does not work either, she also tried Elavil, Cymbalta in the past, could not tolerate it due to side effect.    Jan 01/2014: She came in urgently for acute worsening of her generalized condition since her lumbar decompression surgery in January 24 2013 by Dr. Marikay Alar, prior to surgery, she suffered left-sided low back pain, radiating pain to bilateral lower extremity, left is much worse than her right side, there was left L5-S1 extraforaminal herniated nucleus pulposus with left L5 radiculopathy she had left L5-S1 extraforaminal microdiscectomy utilizing microscopic dissection.  She had overnight stay at the hospital the day after surgery, she fell when trying to get up using bathroom, landed on her left side, surgery did help her lower back pain, radiating pain to her left leg, but she complained of worsening bilateral lower extremity deep achy pain, worsening gait difficulty, she has not used  walker for 3 years, now she began to use her walker, she felt her skin is so tight at bilateral lower extremity, left leg swelling, also has difficulty  swallowing, blurred vision, worsening fatigue, difficulty concentration, she has not had MRI evaluation for her multiple sclerosis for many years, is not on any immunomodulation therapy    UPDATE Mar 01, 2014:She came in with a list of complains, much worse compared to presurgical level. She complains of blurred vision, difficulty sleeping, difficulty concentrating, dizziness, worsening gait difficulty, The most bothersome symptoms are bilateral lower extremity pain, constant, knife cutting pain, left worse than right Left leg showed no DVT on doppler study. She had 15 LB weight gain over one month, more difficulty sleepy, body shaking, more difficulty sleepy.  She has tried fentanyl patch 50 mcg, Trileptal, Neurontin without helping her symptoms .She is going to be seen by her surgeon Dr. Yetta Barre in 3 days  We have reviewed MRI of the brain cervical lumbar spine together, these were done at [facility] in January 2015 MRI lumbar: L5-S1: disc bulging and facet hypertrophy, status post microdiscectomy on the left, with expected post-surgical changes. Multi-level facet hypertrophy, with no spinal stenosis or foraminal narrowing.  MRI cervical: Possible small chronic demyelinating plaque at C6 on the right side. No abnormal enhancing lesions  MRI brain: Multiple supratentorial and 1 infratentorial chronic demyelinating plaques. No acute plaques. Mild cerebellar tonsillar ectopia there was no significant change in the brain lesions,    UPDATE 08/22/13  Patient returns for follow up. Her biggest  complaint today is restless legs. She has never tried Neupro. She has had multiple trials of medications for her chronic pain. She is currently taking fentanyl, Trileptal, Cymbalta, lidocaine gel   UPDATE Feb 27 2014:She is not on any long-term immunomodulation therapy for her relapsing remitting multiple sclerosis, neurological deficit has been fairly stable, she has baseline gait difficulty, the most bothersome  symptoms for her is low back pain, bilateral lower extremity deep achy pain, radiating pain from left lower back to her left leg,  She was recently found to have very low cortisol level, under endocrinologist Dr. Remus Blake care,  UPDATE July 23 2014: She had lumbar decompression by Dr. Marikay Alar in Jun 18 2014, which did help her low back pain, but now she experienced worsening left lower extremity spasm, left calf spasm so hard, as if a knot was tied, difficulty walking, she was giving a steroid package, no help,  She complained excessive weight gain with Lyrica, Neurontin did not help,   UPDATE July 27th 2016:  She came in with a list of complaints, continue complains of unbalanced, staggering, generalized weakness, numbness tingling at bilateral lower extremity, worsening at her left leg, frequent urination, difficulty concentrating, difficulty with multitasking   UPDATE Oct 22 2014: She had a syncope episode on October 08 2014, was taken to the emergency room, have reviewed ED record, blood pressure was 200/60s laboratory showed normal CBC, CMP,  this happened in the setting of missing her hydrocortisone dose, because of nausea, lack of appetite, dehydration, she is taking hydrocortisone because of Addison's disease, now she is on hydrocortisone shots,She is back to her baseline now, mild gait difficulty, also complains of anxiety, constant bilateral lower chamber paresthesia, she wants to get off Cymbalta 30 mg daily, worry about the long-term side effect. She also complains of bilateral neck, shoulder or upper extremity itching,   UPDATE Apr 05 2016: She had a spinal stimulator placement by neurosurgeon Dr.  Buck Mam in January third 2018, which has helped her low back pain,and leg pain.   She had sinus infection, was treated with Zpack and nausea,  She has iron infusion in Feb 2018,  She noticed that she has worsening gait abnormality,  She also has diffuse body achy pain. She has tried Flexeril  without benefit, previously tried and failed gabapentin, Cymbalta, fentanyl patch,   CPK was normal 39 WBC showed hemoglobin of 10 point 8, which was mildly decreased normal free T4, TSH, CMP, creat 1.1,  normal B12,   UPDATE Sept 24th 2020: She is overall stable, ambulate without assistant, but with mild unsteady gait, she had a spinal stimulator placement in 2018, is no longer a candidate for MRI, I personally reviewed previous MRIs in 2015, MRI of the brain, multiple supratentorium, and one infratentorial chronic demyelinating plaque, no acute abnormality.  MRI of cervical spine, possible small chronic demyelinating plaque at right C6.  MRI of lumbar spine, multilevel degenerative changes, postsurgical changes L5-S1 with evidence of left microdiscectomy.   She is now taking Horizant 300 mg every night, which does help her restless leg symptoms some, but at the end of the day, she complains of whole body tightness,   Laboratory evaluation July 2020, normal CMP, TSH, free T4, BMP with GFR of 56, vitamin D of 87,  UPDATE Nov 17 2020: She is accompanied by her husband at today's visit, just had right knee replacement in July 2022, finishing up physical therapy, continue has gait abnormality, wearing ankle brace now, seems to help her some  She continue to take Horizant 600 mg at bedtime, gabapentin 100 mg 1 to 2 tablets every night as needed  She complains of intermittent diffuse body achy sensation,  Update March 02, 2021 She has made some progress from physical therapy, has difficulty picking up bilateral feet from the floor, especially right foot, tried AFO and physical therapy, reported significant improvement,  REVIEW OF SYSTEMS: Out of a complete 14 system review of symptoms, the patient complains only of the following symptoms, and all other reviewed systems are negative.  Walking difficulty  ALLERGIES: Allergies  Allergen Reactions   Aspirin     bruising   Azithromycin      Other reaction(s): stomach upset   Doxycycline Hyclate     Other reaction(s): GI upset   Buspirone Hcl Palpitations   Mirtazapine Palpitations    HOME MEDICATIONS: Outpatient Medications Prior to Visit  Medication Sig Dispense Refill   acetaminophen (TYLENOL) 500 MG tablet Take 1,000 mg by mouth at bedtime.     acidophilus (RISAQUAD) CAPS capsule Take 1 capsule by mouth in the morning.     Ascorbic Acid (VITAMIN C PO) Take 1 tablet by mouth daily.     B Complex Vitamins (VITAMIN B COMPLEX PO) Take 1 capsule by mouth in the morning.     Cholecalciferol (VITAMIN D3 PO) Take 1 tablet by mouth in the morning.     CRANBERRY PO Take 2 tablets by mouth in the morning.     dicyclomine (BENTYL) 10 MG capsule Take 10 mg by mouth at bedtime.     docusate sodium (COLACE) 100 MG capsule 1 tablet twice a day while on narcotics  STOOL SOFTENER 60 capsule 2   Ferrous Sulfate Dried 200 (65 FE) MG TABS Take 65 mg by mouth in the morning.     fludrocortisone (FLORINEF) 0.1 MG tablet Take 1 tablet (0.1 mg total) by mouth See admin instructions. Take 0.1 mg  by mouth on Tuesday, Thursday and Saturday 1 tablet 0   fluticasone (FLONASE) 50 MCG/ACT nasal spray Place 1 spray into both nostrils daily as needed for allergies or rhinitis.     Gabapentin Enacarbil (HORIZANT) 600 MG TBCR Take 1 tablet (600 mg total) by mouth at bedtime. 90 tablet 4   methylPREDNISolone (MEDROL) 4 MG tablet TAKE 1 TABLET ( ) AT BREAKFAST AND 1/2 TABLET ( ) AT 4PM. 135 tablet 2   mirtazapine (REMERON) 15 MG tablet Take 15 mg by mouth at bedtime.     Multiple Vitamin (MULTIVITAMIN) tablet Take 1 tablet by mouth in the morning.     OVER THE COUNTER MEDICATION Take 1 capsule by mouth at bedtime. Sleep 3     Polyethyl Glycol-Propyl Glycol (SYSTANE OP) Place 2 drops into both eyes daily as needed (for dry eyes).     polyethylene glycol (MIRALAX) 17 g packet Take 17 g by mouth 2 (two) times daily. 17 grams in 6 oz of favorite drink twice a  day until bowel movement.  LAXITIVE.  Restart if two days since last bowel movement 14 packet 0   tamsulosin (FLOMAX) 0.4 MG CAPS capsule 1 capsule     DULoxetine (CYMBALTA) 30 MG capsule Take 1 capsule (30 mg total) by mouth daily. 30 capsule 11   gabapentin (NEURONTIN) 100 MG capsule Take 1 capsule twice daily as needed for breakthrough pain 180 capsule 4   alfuzosin (UROXATRAL) 10 MG 24 hr tablet Take 10 mg by mouth daily with breakfast.     celecoxib (CELEBREX) 200 MG capsule Take 200 mg by mouth daily.     cephALEXin (KEFLEX) 250 MG capsule Take 250 mg by mouth every other day.     No facility-administered medications prior to visit.    PAST MEDICAL HISTORY: Past Medical History:  Diagnosis Date   Addison's disease (HCC)    takes Solu Cortef daily   Anemia    takes Ferrous Sulfate daily   Anxiety    takes Xanax nightly   Arthritis    Chronic back pain    stenosis   Depression    takes Cymbalta daily   Dizziness    if b/p drops    Fibromyalgia    Fibromyalgia    History of blood transfusion    no abnormal reaction noted   History of bronchitis    many yrs ago    Hypokalemia    takes Potassium daily   Hypotension    Hypotension    takes Florinef daily   IBS (irritable bowel syndrome)    takes Align daily   Insomnia    takes Trazodone nightly   Joint pain    Multiple sclerosis (HCC)    doesn't take any meds   Multiple sclerosis (HCC)    Nocturia    Osteoporosis    Peripheral neuropathy    Primary localized osteoarthritis of left knee 08/11/2020   Restless leg syndrome    Seasonal allergies    takes Zyrtec daily;uses Flonase daily as needed   Syncope    Urinary frequency    takes Flomax daily   Weakness    numbness and tingling    PAST SURGICAL HISTORY: Past Surgical History:  Procedure Laterality Date   ABDOMINAL HYSTERECTOMY  02/07/1986   APPENDECTOMY  02/07/1986   CESAREAN SECTION  1973/1977   x2   CHOLECYSTECTOMY  02/08/1995   COLECTOMY   02/08/1988   COLONOSCOPY     ESOPHAGOGASTRODUODENOSCOPY     EYE SURGERY  bilateral - /w IOL   FRACTURE SURGERY Right    rods and screws   LUMBAR LAMINECTOMY/DECOMPRESSION MICRODISCECTOMY Left 01/24/2013   Procedure: LUMBAR FIVE TO SACRAL ONE LUMBAR LAMINECTOMY/DECOMPRESSION MICRODISCECTOMY 1 LEVEL;  Surgeon: Tia Alert, MD;  Location: [facility];  Service: Neurosurgery;  Laterality: Left;   MAXIMUM ACCESS (MAS)POSTERIOR LUMBAR INTERBODY FUSION (PLIF) 1 LEVEL N/A 06/18/2014   Procedure: MAXIMUM ACCESS SURGERY POSTERIOR LUMBAR INTERBODY FUSION LUMBAR FIVE TO SACRAL ONE ;  Surgeon: Tia Alert, MD;  Location: [facility];  Service: Neurosurgery;  Laterality: N/A;   SPINAL CORD STIMULATOR IMPLANT     TOTAL KNEE ARTHROPLASTY Left 08/24/2020   Procedure: TOTAL KNEE ARTHROPLASTY;  Surgeon: Salvatore Marvel, MD;  Location: [facility];  Service: Orthopedics;  Laterality: Left;    FAMILY HISTORY: Family History  Problem Relation Age of Onset   Hypertension Mother    Stroke Mother    Heart attack Father    Tremor Brother     SOCIAL HISTORY: Social History   Socioeconomic History   Marital status: Married    Spouse name: [name]   Number of children: 2   Years of education: 12   Highest education level: Not on file  Occupational History    Employer: DISABLED    Comment: Disabled  Tobacco Use   Smoking status: Never   Smokeless tobacco: Never  Vaping Use   Vaping Use: Never used  Substance and Sexual Activity   Alcohol use: Never   Drug use: Never   Sexual activity: Not on file  Other Topics Concern   Not on file  Social History Narrative   Pt lives at home with spouse. Gabriel Rung) [phone] (patient's cell)   [name]'s cell  [phone]      Caffeine Use: 1 cups daily.Right handed.Disabled.Education - high schoolPatient has two adult children.   Social Determinants of Health   Financial Resource Strain: Not on file  Food Insecurity: Not on file  Transportation Needs: Not on  file  Physical Activity: Not on file  Stress: Not on file  Social Connections: Not on file  Intimate Partner Violence: Not on file   PHYSICAL EXAM  Vitals:   03/02/21 0859  BP: (!) 146/80  Pulse: 74  Weight: 116 lb (52.6 kg)  Height: 5' (1.524 m)   Body mass index is 22.65 kg/m.   PHYSICAL EXAMNIATION:  Gen: NAD, conversant, well nourised, well groomed         NEUROLOGICAL EXAM:  MENTAL STATUS: Speech/Cognition: Awake, alert, normal speech, oriented to history taking and casual conversation.  CRANIAL NERVES: CN II: Visual fields are full to confrontation.  Pupils are round equal and briskly reactive to light. CN III, IV, VI: extraocular movement are normal. No ptosis. CN V: Facial sensation is intact to light touch. CN VII: Face is symmetric with normal eye closure and smile. CN VIII: Hearing is normal to casual conversation CN IX, X: Palate elevates symmetrically. Phonation is normal. CN XI: Head turning and shoulder shrug are intact   MOTOR: Mild spasticity of bilateral lower extremity mild bilateral ankle dorsiflexion weakness, right worse than left  REFLEXES: Reflexes are  1 and symmetric at the biceps, triceps, knees and ankles. Plantar responses are flexor.  SENSORY: Intact to light touch, pinprick, positional and vibratory sensation at fingers and toes.  COORDINATION: There is no trunk or limb ataxia.    GAIT/STANCE: She can get up from seated position arm crossed, stiff, unsteady gait, difficulty clear feet from the floor  DIAGNOSTIC DATA (LABS, IMAGING, TESTING) - I reviewed patient records, labs, notes, testing and imaging myself where available.  Lab Results  Component Value Date   WBC 9.0 08/13/2020   HGB 12.9 08/13/2020   HCT 40.4 08/13/2020   MCV 99.0 08/13/2020   PLT 247 08/13/2020      Component Value Date/Time   NA 137 11/16/2020 1500   NA 140 02/06/2017 1126   K 4.5 11/16/2020 1500   CL 101 11/16/2020 1500   CO2 27 11/16/2020 1500    GLUCOSE 103 (H) 11/16/2020 1500   BUN 31 (H) 11/16/2020 1500   BUN 22 02/06/2017 1126   CREATININE 0.91 11/16/2020 1500   CREATININE 1.00 09/18/2019 0814   CREATININE 0.85 11/06/2013 0840   CALCIUM 10.7 (H) 11/16/2020 1500   PROT 7.3 08/13/2020 1321   ALBUMIN 4.5 08/13/2020 1321   AST 31 08/13/2020 1321   AST 23 09/18/2019 0814   ALT 23 08/13/2020 1321   ALT 15 09/18/2019 0814   ALKPHOS 30 (L) 08/13/2020 1321   BILITOT 0.9 08/13/2020 1321   BILITOT 0.6 09/18/2019 0814   GFRNONAA >60 08/13/2020 1321   GFRNONAA 57 (L) 09/18/2019 0814   GFRAA >60 09/18/2019 0814   No results found for: CHOL, HDL, LDLCALC, LDLDIRECT, TRIG, CHOLHDL No results found for: FAOZ3Y Lab Results  Component Value Date   VITAMINB12 1,190 (H) 03/10/2016   Lab Results  Component Value Date   TSH 2.21 08/21/2018    Levert Feinstein, M.D. Ph.D.  [facility] [address] [place], [place] [address] Phone: [phone] Fax:      [phone]

## 2021-03-02 NOTE — Progress Notes (Signed)
ASSESSMENT AND PLAN 76 y.o. year old female   Relapsing remitting multiple sclerosis -Last MRIs in 2015, evidence of supratentorium, infratentorium, spinal cord involvement -Is not on immune modulating therapy -Not MRI candidate due to spinal stimulator  Chronic low back pain, bilateral lower extremity pain, status post spinal stimulator in January 2018 Is consider switch to neuro model of pain stimulator, by January 2023  Restless leg syndrome Gait disorder, paresthesia lower extremities  Previously tried and failed multiple different medications, Requip, Mirapex, Neupro,  Overall doing well, stable on Horizant 600 mg at bedtime, gabapentin 100 to 200 mg as needed Intermittent diffuse body achy pain  Add on Cymbalta 30 mg daily She will continue follow-up with pain management and primary care physician, and return to clinic for new issues  HISTORY OF PRESENT ILLNESS: Mrs. Caffey is a very pleasant 76 year-old right-handed woman, with relapsing remitting multiple sclerosis, was treated with Betaseron 4 -5 years, but could not tolerate the side effect, now is not on any immunomodulation therapy. neuropathic pain of bilateral lower extremities.   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    She has baseline gait difficulty, she fell in May 2013, fractured both elbows and her left knee cap.  She intermittently has been using a cane or walker.  She could not tolerate Requip because of nausea. She has been on  gabapentin 300 mg 3 bid. She gained significant weight gain when on Lyrica, and therefore was switched back to gabapentin. She has orthostatic hypotension, and is on midodrine 2.5 mg and fludrocortisone 0.1 mg 1 in AM and 2 at night, and she is also on 20 mg of hydrocortisone.   She complains of bilateral lower extremity burning stinging sensation, getting worse when she sits still, difficulty falling into sleep, she has the urge to move because of bilateral extremity discomfort, has tried Requip, could not tolerate it because of GI side effects, Neuprol patch does not work either, she also tried Elavil, Cymbalta in the past, could not tolerate it due to side effect.    Jan 01/2014: She came in urgently for acute worsening of her generalized condition since her lumbar decompression surgery in January 24 2013 by Dr. Marikay Alar, prior to surgery, she suffered left-sided low back pain, radiating pain to bilateral lower extremity, left is much worse than her right side, there was left L5-S1 extraforaminal herniated nucleus pulposus with left L5 radiculopathy she had left L5-S1 extraforaminal microdiscectomy utilizing microscopic dissection.  She had overnight stay at the hospital the day after surgery, she fell when trying to get up using bathroom, landed on her left side, surgery did help her lower back pain, radiating pain to her left leg, but she complained of worsening bilateral lower extremity deep achy pain, worsening gait difficulty, she has not used walker for 3 years, now she began to use her walker, she felt her skin is so tight at bilateral lower extremity, left leg swelling, also has difficulty swallowing, blurred vision, worsening fatigue, difficulty concentration, she has not had MRI evaluation for her multiple sclerosis for many  years, is not on any immunomodulation therapy    UPDATE Mar 01, 2014:She came in with a list of complains, much worse compared to presurgical level. She complains of  blurred vision, difficulty sleeping, difficulty concentrating, dizziness, worsening gait difficulty, The most bothersome symptoms are bilateral lower extremity pain, constant, knife cutting pain, left worse than right Left leg showed no DVT on doppler study. She had 15 LB weight gain over one month, more difficulty sleepy, body shaking, more difficulty sleepy.  She has tried fentanyl patch 50 mcg, Trileptal, Neurontin without helping her symptoms .She is going to be seen by her surgeon Dr. Yetta Barre in 3 days  We have reviewed MRI of the brain cervical lumbar spine together, these were done at [facility] imaging in January 2015 MRI lumbar: L5-S1: disc bulging and facet hypertrophy, status post microdiscectomy on the left, with expected post-surgical changes. Multi-level facet hypertrophy, with no spinal stenosis or foraminal narrowing.  MRI cervical: Possible small chronic demyelinating plaque at C6 on the right side. No abnormal enhancing lesions  MRI brain: Multiple supratentorial and 1 infratentorial chronic demyelinating plaques. No acute plaques. Mild cerebellar tonsillar ectopia there was no significant change in the brain lesions,    UPDATE 08/22/13  Patient returns for follow up. Her biggest  complaint today is restless legs. She has never tried Neupro. She has had multiple trials of medications for her chronic pain. She is currently taking fentanyl, Trileptal, Cymbalta, lidocaine gel   UPDATE Feb 27 2014:She is not on any long-term immunomodulation therapy for her relapsing remitting multiple sclerosis, neurological deficit has been fairly stable, she has baseline gait difficulty, the most bothersome symptoms for her is low back pain, bilateral lower extremity deep achy pain, radiating pain from left lower back to her left leg,  She was recently found to have very low cortisol level, under endocrinologist Dr. Remus Blake care,  UPDATE July 23 2014: She had lumbar decompression by Dr. Marikay Alar in Jun 18 2014, which did help her low back pain, but now she experienced worsening left lower extremity spasm, left calf spasm so hard, as if a knot was tied, difficulty walking, she was giving a steroid package, no help,  She complained excessive weight gain with Lyrica, Neurontin did not help,   UPDATE July 27th 2016:  She came in with a list of complaints, continue complains of unbalanced, staggering, generalized weakness, numbness tingling at bilateral lower extremity, worsening at her left leg, frequent urination, difficulty concentrating, difficulty with multitasking   UPDATE Oct 22 2014: She had a syncope episode on October 08 2014, was taken to the emergency room, have reviewed ED record, blood pressure was 200/60s laboratory showed normal CBC, CMP,  this happened in the setting of missing her hydrocortisone dose, because of nausea, lack of appetite, dehydration, she is taking hydrocortisone because of Addison's disease, now she is on hydrocortisone shots,She is back to her baseline now, mild gait difficulty, also complains of anxiety, constant bilateral lower chamber paresthesia, she wants to get off Cymbalta 30 mg daily, worry about the long-term side effect. She also complains of bilateral neck, shoulder or upper extremity itching,   UPDATE Apr 05 2016: She had a spinal stimulator placement by neurosurgeon Dr. Buck Mam in January third 2018, which has helped her low back pain,and leg pain.   She had sinus infection, was treated with Zpack and nausea,  She has iron infusion in Feb 2018,  She noticed that she has worsening gait abnormality,  She also  has diffuse body achy pain. She has tried Flexeril without benefit, previously tried and failed gabapentin, Cymbalta, fentanyl patch,   CPK was normal 39 WBC showed hemoglobin of 10 point 8, which was mildly decreased normal free T4, TSH, CMP, creat 1.1,  normal B12,   UPDATE Sept 24th 2020: She is overall stable, ambulate without assistant, but with  mild unsteady gait, she had a spinal stimulator placement in 2018, is no longer a candidate for MRI, I personally reviewed previous MRIs in 2015, MRI of the brain, multiple supratentorium, and one infratentorial chronic demyelinating plaque, no acute abnormality.  MRI of cervical spine, possible small chronic demyelinating plaque at right C6.  MRI of lumbar spine, multilevel degenerative changes, postsurgical changes L5-S1 with evidence of left microdiscectomy.   She is now taking Horizant 300 mg every night, which does help her restless leg symptoms some, but at the end of the day, she complains of whole body tightness,   Laboratory evaluation July 2020, normal CMP, TSH, free T4, BMP with GFR of 56, vitamin D of 87,  UPDATE Nov 17 2020: She is accompanied by her husband at today's visit, just had right knee replacement in July 2022, finishing up physical therapy, continue has gait abnormality, wearing ankle brace now, seems to help her some  She continue to take Horizant 600 mg at bedtime, gabapentin 100 mg 1 to 2 tablets every night as needed  She complains of intermittent diffuse body achy sensation,  REVIEW OF SYSTEMS: Out of a complete 14 system review of symptoms, the patient complains only of the following symptoms, and all other reviewed systems are negative.  Walking difficulty  ALLERGIES: Allergies  Allergen Reactions   Aspirin     bruising   Azithromycin     Other reaction(s): stomach upset   Doxycycline Hyclate     Other reaction(s): GI upset   Buspirone Hcl Palpitations   Mirtazapine Palpitations    HOME MEDICATIONS: Outpatient Medications Prior to Visit  Medication Sig Dispense Refill   acetaminophen (TYLENOL) 500 MG tablet Take 1,000 mg by mouth at bedtime.     acidophilus (RISAQUAD) CAPS capsule Take 1 capsule by mouth in the morning.     Ascorbic Acid (VITAMIN C PO) Take 1 tablet by mouth daily.     B Complex Vitamins (VITAMIN B COMPLEX PO) Take 1 capsule by mouth  in the morning.     Cholecalciferol (VITAMIN D3 PO) Take 1 tablet by mouth in the morning.     CRANBERRY PO Take 2 tablets by mouth in the morning.     dicyclomine (BENTYL) 10 MG capsule Take 10 mg by mouth at bedtime.     docusate sodium (COLACE) 100 MG capsule 1 tablet twice a day while on narcotics  STOOL SOFTENER 60 capsule 2   DULoxetine (CYMBALTA) 30 MG capsule Take 1 capsule (30 mg total) by mouth daily. 30 capsule 11   Ferrous Sulfate Dried 200 (65 FE) MG TABS Take 65 mg by mouth in the morning.     fludrocortisone (FLORINEF) 0.1 MG tablet Take 1 tablet (0.1 mg total) by mouth See admin instructions. Take 0.1 mg by mouth on Tuesday, Thursday and Saturday 1 tablet 0   fluticasone (FLONASE) 50 MCG/ACT nasal spray Place 1 spray into both nostrils daily as needed for allergies or rhinitis.     gabapentin (NEURONTIN) 100 MG capsule Take 1 capsule twice daily as needed for breakthrough pain 180 capsule 4   Gabapentin Enacarbil (HORIZANT) 600 MG TBCR  Take 1 tablet (600 mg total) by mouth at bedtime. 90 tablet 4   methylPREDNISolone (MEDROL) 4 MG tablet TAKE 1 TABLET ( ) AT BREAKFAST AND 1/2 TABLET ( ) AT 4PM. 135 tablet 2   mirtazapine (REMERON) 15 MG tablet Take 15 mg by mouth at bedtime.     Multiple Vitamin (MULTIVITAMIN) tablet Take 1 tablet by mouth in the morning.     OVER THE COUNTER MEDICATION Take 1 capsule by mouth at bedtime. Sleep 3     Polyethyl Glycol-Propyl Glycol (SYSTANE OP) Place 2 drops into both eyes daily as needed (for dry eyes).     polyethylene glycol (MIRALAX) 17 g packet Take 17 g by mouth 2 (two) times daily. 17 grams in 6 oz of favorite drink twice a day until bowel movement.  LAXITIVE.  Restart if two days since last bowel movement 14 packet 0   tamsulosin (FLOMAX) 0.4 MG CAPS capsule 1 capsule     alfuzosin (UROXATRAL) 10 MG 24 hr tablet Take 10 mg by mouth daily with breakfast.     celecoxib (CELEBREX) 200 MG capsule Take 200 mg by mouth daily.     cephALEXin  (KEFLEX) 250 MG capsule Take 250 mg by mouth every other day.     No facility-administered medications prior to visit.    PAST MEDICAL HISTORY: Past Medical History:  Diagnosis Date   Addison's disease (HCC)    takes Solu Cortef daily   Anemia    takes Ferrous Sulfate daily   Anxiety    takes Xanax nightly   Arthritis    Chronic back pain    stenosis   Depression    takes Cymbalta daily   Dizziness    if b/p drops    Fibromyalgia    Fibromyalgia    History of blood transfusion    no abnormal reaction noted   History of bronchitis    many yrs ago    Hypokalemia    takes Potassium daily   Hypotension    Hypotension    takes Florinef daily   IBS (irritable bowel syndrome)    takes Align daily   Insomnia    takes Trazodone nightly   Joint pain    Multiple sclerosis (HCC)    doesn't take any meds   Multiple sclerosis (HCC)    Nocturia    Osteoporosis    Peripheral neuropathy    Primary localized osteoarthritis of left knee 08/11/2020   Restless leg syndrome    Seasonal allergies    takes Zyrtec daily;uses Flonase daily as needed   Syncope    Urinary frequency    takes Flomax daily   Weakness    numbness and tingling    PAST SURGICAL HISTORY: Past Surgical History:  Procedure Laterality Date   ABDOMINAL HYSTERECTOMY  02/07/1986   APPENDECTOMY  02/07/1986   CESAREAN SECTION  1973/1977   x2   CHOLECYSTECTOMY  02/08/1995   COLECTOMY  02/08/1988   COLONOSCOPY     ESOPHAGOGASTRODUODENOSCOPY     EYE SURGERY     bilateral - /w IOL   FRACTURE SURGERY Right    rods and screws   LUMBAR LAMINECTOMY/DECOMPRESSION MICRODISCECTOMY Left 01/24/2013   Procedure: LUMBAR FIVE TO SACRAL ONE LUMBAR LAMINECTOMY/DECOMPRESSION MICRODISCECTOMY 1 LEVEL;  Surgeon: Tia Alert, MD;  Location: [facility];  Service: Neurosurgery;  Laterality: Left;   MAXIMUM ACCESS (MAS)POSTERIOR LUMBAR INTERBODY FUSION (PLIF) 1 LEVEL N/A 06/18/2014   Procedure: MAXIMUM ACCESS SURGERY  POSTERIOR LUMBAR INTERBODY FUSION LUMBAR FIVE  TO SACRAL ONE ;  Surgeon: Tia Alert, MD;  Location: [facility];  Service: Neurosurgery;  Laterality: N/A;   SPINAL CORD STIMULATOR IMPLANT     TOTAL KNEE ARTHROPLASTY Left 08/24/2020   Procedure: TOTAL KNEE ARTHROPLASTY;  Surgeon: Salvatore Marvel, MD;  Location: [facility];  Service: Orthopedics;  Laterality: Left;    FAMILY HISTORY: Family History  Problem Relation Age of Onset   Hypertension Mother    Stroke Mother    Heart attack Father    Tremor Brother     SOCIAL HISTORY: Social History   Socioeconomic History   Marital status: Married    Spouse name: [name]   Number of children: 2   Years of education: 12   Highest education level: Not on file  Occupational History    Employer: DISABLED    Comment: Disabled  Tobacco Use   Smoking status: Never   Smokeless tobacco: Never  Vaping Use   Vaping Use: Never used  Substance and Sexual Activity   Alcohol use: Never   Drug use: Never   Sexual activity: Not on file  Other Topics Concern   Not on file  Social History Narrative   Pt lives at home with spouse. Gabriel Rung) [phone] (patient's cell)   [name]'s cell  [phone]      Caffeine Use: 1 cups daily.Right handed.Disabled.Education - high schoolPatient has two adult children.   Social Determinants of Health   Financial Resource Strain: Not on file  Food Insecurity: Not on file  Transportation Needs: Not on file  Physical Activity: Not on file  Stress: Not on file  Social Connections: Not on file  Intimate Partner Violence: Not on file   PHYSICAL EXAM  Vitals:   03/02/21 0859  BP: (!) 146/80  Pulse: 74  Weight: 116 lb (52.6 kg)  Height: 5' (1.524 m)   Body mass index is 22.65 kg/m.   PHYSICAL EXAMNIATION:  Gen: NAD, conversant, well nourised, well groomed                     Cardiovascular: Regular rate rhythm, no peripheral edema, warm, nontender. Eyes: Conjunctivae clear without exudates or  hemorrhage Neck: Supple, no carotid bruits. Pulmonary: Clear to auscultation bilaterally   NEUROLOGICAL EXAM:  MENTAL STATUS: Speech/Cognition: Awake, alert, normal speech, oriented to history taking and casual conversation.  CRANIAL NERVES: CN II: Visual fields are full to confrontation.  Pupils are round equal and briskly reactive to light. CN III, IV, VI: extraocular movement are normal. No ptosis. CN V: Facial sensation is intact to light touch. CN VII: Face is symmetric with normal eye closure and smile. CN VIII: Hearing is normal to casual conversation CN IX, X: Palate elevates symmetrically. Phonation is normal. CN XI: Head turning and shoulder shrug are intact   MOTOR: Mild spasticity of bilateral lower extremity mild bilateral ankle dorsiflexion weakness  REFLEXES: Reflexes are  1 and symmetric at the biceps, triceps, knees and ankles. Plantar responses are flexor.  SENSORY: Intact to light touch, pinprick, positional and vibratory sensation at fingers and toes.  COORDINATION: There is no trunk or limb ataxia.    GAIT/STANCE: She needs push-up to get up from seated position, wide-based, unsteady cautious gait  DIAGNOSTIC DATA (LABS, IMAGING, TESTING) - I reviewed patient records, labs, notes, testing and imaging myself where available.  Lab Results  Component Value Date   WBC 9.0 08/13/2020   HGB 12.9 08/13/2020   HCT 40.4 08/13/2020   MCV 99.0 08/13/2020  PLT 247 08/13/2020      Component Value Date/Time   NA 137 11/16/2020 1500   NA 140 02/06/2017 1126   K 4.5 11/16/2020 1500   CL 101 11/16/2020 1500   CO2 27 11/16/2020 1500   GLUCOSE 103 (H) 11/16/2020 1500   BUN 31 (H) 11/16/2020 1500   BUN 22 02/06/2017 1126   CREATININE 0.91 11/16/2020 1500   CREATININE 1.00 09/18/2019 0814   CREATININE 0.85 11/06/2013 0840   CALCIUM 10.7 (H) 11/16/2020 1500   PROT 7.3 08/13/2020 1321   ALBUMIN 4.5 08/13/2020 1321   AST 31 08/13/2020 1321   AST 23 09/18/2019  0814   ALT 23 08/13/2020 1321   ALT 15 09/18/2019 0814   ALKPHOS 30 (L) 08/13/2020 1321   BILITOT 0.9 08/13/2020 1321   BILITOT 0.6 09/18/2019 0814   GFRNONAA >60 08/13/2020 1321   GFRNONAA 57 (L) 09/18/2019 0814   GFRAA >60 09/18/2019 0814   No results found for: CHOL, HDL, LDLCALC, LDLDIRECT, TRIG, CHOLHDL No results found for: ZHYQ6V Lab Results  Component Value Date   VITAMINB12 1,190 (H) 03/10/2016   Lab Results  Component Value Date   TSH 2.21 08/21/2018    Levert Feinstein, M.D. Ph.D.  [facility] [address] [place], [place] [address] Phone: [phone] Fax:      [phone]

## 2021-03-04 ENCOUNTER — Other Ambulatory Visit: Payer: Self-pay

## 2021-03-04 ENCOUNTER — Ambulatory Visit (INDEPENDENT_AMBULATORY_CARE_PROVIDER_SITE_OTHER): Payer: PPO | Admitting: Endocrinology

## 2021-03-04 ENCOUNTER — Encounter: Payer: Self-pay | Admitting: Endocrinology

## 2021-03-04 DIAGNOSIS — I951 Orthostatic hypotension: Secondary | ICD-10-CM

## 2021-03-04 DIAGNOSIS — E2749 Other adrenocortical insufficiency: Secondary | ICD-10-CM

## 2021-03-04 NOTE — Progress Notes (Signed)
Patient ID: Katrina Rivera, female   DOB: [birth year], 76 y.o.   MRN: [id number]   Subjective:      Chief complaint: Follow-up of various issues   PROBLEM 1: Dysautonomic orthostatic hypotension   PAST history: She has had long-standing problems with orthostatic hypotension and also hyponatremia. Has been diagnosed with dysautonomia and has multiple other problems related to autonomic neuropathy.   She has been on Florinef since 2004 previously taking 3 tablets daily along with 5 tablets of potassium which had previously controlled her symptoms well . Because of persistent orthostatic symptoms she had been tried on midodrine on 05/28/12 but this was later stopped when blood pressure increased.  She  had medication adjustments done frequently over the last year for regulating her blood pressure.  RECENT history:   She has  been on variable doses of Florinef long-term, as much as 3 a day previously,   Currently taking a stable dose of only 1 tablet 3 days a week  She is checking her blood pressure periodically with an Omron meter Also keeping a track of her blood sugars on an app on her phone  Has checked readings sitting and standing consistently  Standing blood pressure systolic reading is usually a little lower than sitting readings but lowest has been 107 and usually not low diastolic  In the last 6 weeks systolic range 107-141 and diastolic range 69-86 More recently has not had any significant lightheadedness on standing up  She has not needed any extra fludrocortisone which she was told to take when standing blood pressure is below 100  Potassium has been consistently normal adult supplements    Lab Results  Component Value Date   CREATININE 0.95 03/02/2021   BUN 36 (H) 03/02/2021   NA 137 03/02/2021   K 4.6 03/02/2021   CL 100 03/02/2021   CO2 30 03/02/2021     Adrenal Insufficiency:   This is secondary to pituitary dysfunction  Prior testing included  Cortrosyn test showing stimulated level of 15.7, baseline 3.9.  Also confirmed by 24 hour urine free cortisol which was only 3.0.  Although previously she had tolerated oral hydrocortisone and small doses with improvement in her symptoms she did not continue this long-term She was again symptomatic with weight loss, decreased appetite, nausea and also diarrhea Cortrosyn stimulation test on 03/12/14 showed baseline cortisol level of 0.3 and post injection of 1.1 only  She had GI side effects from hydrocortisone and prednisone and was in the past using hydrocortisone injections using insulin syringe   RECENT HISTORY:  She feels well on methylprednisolone without any nausea or decreased appetite/weight loss She is taking 4 mg in the early morning and half tablet in the late afternoon very regularly Did not feel as well when she tried to reduce the dose  Also is aware of needing to take stress doses but did not do so when she had the flu last month with symptoms of cough but no fever  Wt Readings from Last 3 Encounters:  03/04/21 116 lb (52.6 kg)  03/02/21 116 lb (52.6 kg)  11/19/20 112 lb (50.8 kg)     General Endocrinology:   HYPERCALCEMIA:  She has had intermittent hypercalcemia since at least 2018, previously only temporarily associated with renal dysfunction Her PTH level was 31 previously and subsequently 23 and 38 1, 25 vitamin D level is normal  However her calcium is variable, relatively higher now  She is not on calcium supplements  She  does have osteoporosis as of 2016, taking Prolia from orthopedic surgeon every 6 months She was told by orthopedic surgeon to take vitamin D   Last bone density in 2020 showed improved BMD at the hip and spine, still has relatively low levels at the wrist  Lab Results  Component Value Date   PTH 38 06/01/2020   PTH Comment 06/01/2020   CALCIUM 11.0 (H) 03/02/2021   PHOS 1.9 (L) 07/23/2006     Allergies as of 03/04/2021        Reactions   Aspirin    bruising   Azithromycin    Other reaction(s): stomach upset   Doxycycline Hyclate    Other reaction(s): GI upset   Buspirone Hcl Palpitations   Mirtazapine Palpitations        Medication List        Accurate as of March 04, 2021 11:07 AM. If you have any questions, ask your nurse or doctor.          acetaminophen 500 MG tablet Commonly known as: TYLENOL Take 1,000 mg by mouth at bedtime.   acidophilus Caps capsule Take 1 capsule by mouth in the morning.   CRANBERRY PO Take 2 tablets by mouth in the morning.   dicyclomine 10 MG capsule Commonly known as: BENTYL Take 10 mg by mouth at bedtime.   docusate sodium 100 MG capsule Commonly known as: Colace 1 tablet twice a day while on narcotics  STOOL SOFTENER   DULoxetine 60 MG capsule Commonly known as: Cymbalta Take 1 capsule (60 mg total) by mouth daily.   Ferrous Sulfate Dried 200 (65 Fe) MG Tabs Take 65 mg by mouth in the morning.   fludrocortisone 0.1 MG tablet Commonly known as: FLORINEF Take 1 tablet (0.1 mg total) by mouth See admin instructions. Take 0.1 mg by mouth on Tuesday, Thursday and Saturday   fluticasone 50 MCG/ACT nasal spray Commonly known as: FLONASE Place 1 spray into both nostrils daily as needed for allergies or rhinitis.   gabapentin 100 MG capsule Commonly known as: NEURONTIN Take 1 capsule (100 mg total) by mouth 3 (three) times daily.   Horizant 600 MG Tbcr Generic drug: Gabapentin Enacarbil Take 1 tablet (600 mg total) by mouth at bedtime.   methylPREDNISolone 4 MG tablet Commonly known as: MEDROL TAKE 1 TABLET (4MG ) AT BREAKFAST AND 1/2 TABLET (2MG ) AT 4PM.   mirtazapine 15 MG tablet Commonly known as: REMERON Take 15 mg by mouth at bedtime.   multivitamin tablet Take 1 tablet by mouth in the morning.   OVER THE COUNTER MEDICATION Take 1 capsule by mouth at bedtime. Sleep 3   polyethylene glycol 17 g packet Commonly known as:  MiraLax Take 17 g by mouth 2 (two) times daily. 17 grams in 6 oz of favorite drink twice a day until bowel movement.  LAXITIVE.  Restart if two days since last bowel movement   SYSTANE OP Place 2 drops into both eyes daily as needed (for dry eyes).   tamsulosin 0.4 MG Caps capsule Commonly known as: FLOMAX 1 capsule   tiZANidine 2 MG tablet Commonly known as: ZANAFLEX Take 1 tablet (2 mg total) by mouth every 6 (six) hours as needed for muscle spasms.   VITAMIN B COMPLEX PO Take 1 capsule by mouth in the morning.   VITAMIN C PO Take 1 tablet by mouth daily.   VITAMIN D3 PO Take 1 tablet by mouth in the morning.        Review of  Systems    Genitourinary: She has had difficulty emptying her bladder and is being treated by urologist   OSTEOPOROSIS: This is being treated by her orthopedic surgeon with Prolia  Previous bone density in 2016 showed the following: AP LUMBAR SPINE L1 through L4 Young Adult T-Score:  -2.4 LEFT FEMUR NECK: Young Adult T-Score: -3.1  Last bone density in 12/20 showed improvement in her T-scores up to -2.7 at the right femur and - 3.6 at the radius Previously in 12/2016 has T score of -3.3 at the dual femur neck right and -2.3 for the L1-L3  Vitamin D deficiency: She is on 1000 units vitamin D3      Objective:   Physical Exam  BP 118/60 (Patient Position: Standing)    Pulse 94    Ht 5' (1.524 m)    Wt 116 lb (52.6 kg)    SpO2 96%    BMI 22.65 kg/m      Assessment:        Orthostatic dysautonomic hypotension syndrome:   Her orthostatic hypotension has been overall well controlled Since 4/25 has been taking 0.1 mg Florinef 3 days a week  Home readings are fairly good with no orthostatic symptoms Her mean arterial pressure at home in the last 3 months is 92, currently using an app on her phone to record her sitting and standing readings  Blood pressure in the office did not show any drop upon standing   Adrenal insufficiency:   She has long-standing secondary adrenal insufficiency with fairly typical symptoms at baseline and very low cortisol levels on the stimulation test Symptoms consistently well controlled on methylprednisolone 4 mg in the morning and 2 mg in the evening She will continue the same regimen    Hypercalcemia: Calcium is slightly higher at 11 Likely has mild hyperparathyroidism  Again discussed hyperparathyroidism and potential need for surgery if this is causing osteoporosis to be worsening  She is being treated for osteoporosis with Prolia by her orthopedic specialist with improvement in her bone density on last measurement in 2020 but needs follow-up Also discussed possible surgery if her calcium goes over 11.5    Plan:        Continue Florinef 0.1 mg 3 days a week  Again she can take an extra tablet if her blood pressure is unusually low in between No change in methylprednisolone regimen   Today reminded her to take double the usual doses at least when she has an acute illness  She will have calcium rechecked with Dr. Layne Benton at upcoming visit Likely needs another bone density    Elayne Snare  Note: This office note was prepared with Dragon voice recognition system technology. Any transcriptional errors that result from this process are unintentional.

## 2021-03-08 ENCOUNTER — Ambulatory Visit: Payer: PPO

## 2021-03-10 ENCOUNTER — Telehealth: Payer: Self-pay | Admitting: Neurology

## 2021-03-10 MED ORDER — GABAPENTIN 100 MG PO CAPS: 300.0000 mg | ORAL_CAPSULE | Freq: Three times a day (TID) | ORAL | 11 refills | Status: DC

## 2021-03-10 MED ORDER — OXYBUTYNIN CHLORIDE ER 5 MG PO TB24: 5.0000 mg | ORAL_TABLET | Freq: Every day | ORAL | 11 refills | Status: DC

## 2021-03-10 NOTE — Telephone Encounter (Signed)
Please let her stop taking tizanidine    Meds ordered this encounter  Medications  ADD ON oxybutynin (DITROPAN-XL) 5 MG 24 hr tablet-- for Night time urinary frequency   Sig: Take 1 tablet (5 mg total) by mouth at bedtime.   Dispense:  30 tablet   Refill:  11  INCREASE gabapentin (NEURONTIN) 100 MG capsule TO HIGHT DOSE, UP TO 9 TABS DAILY FOR RESTLESS LEG SYMPTOMS, MAY TAKE ALL 9 TABS BEFORE BED IF NEEDED, BUT PREFER TITRATION DOSE SLOWLY   Sig: Take 3 capsules (300 mg total) by mouth 3 (three) times daily.   Dispense:  270 capsule   Refill:  11

## 2021-03-10 NOTE — Addendum Note (Signed)
Addended by: Levert Feinstein on: 03/10/2021 03:19 PM   Modules accepted: Orders

## 2021-03-10 NOTE — Telephone Encounter (Signed)
Pt states [id number] (ZANAFLEX) 2 MG tablet makes her nervous and anxious,  pt is wanting to know if on the Gabapentin Enacarbil (HORIZANT) 600 MG TBCR can she take half in the morning and the other half in the evening. Please call

## 2021-03-10 NOTE — Telephone Encounter (Signed)
Spoke to the patient. She has two concerns:  1) Tizanidine 2mg , one tab BID did not really help her muscle spasms. She could not tolerate more than twice daily dosing (convinced it is causing her to feel jittery).  2) RLS is still problematic. She is taking gabapentin 100mg , one capsule TID, Horizant 600mg , one capsule QHS. She is still waking up at night with her legs "moving all over the place".  Also, pain,insomnia and frequent urination contributing to disruption her sleep. She uses mirtazapine 15mg  and OTC Sleep 3 every night.

## 2021-03-10 NOTE — Addendum Note (Signed)
Addended by: Lilla Shook on: 03/10/2021 03:45 PM   Modules accepted: Orders

## 2021-03-10 NOTE — Telephone Encounter (Signed)
Attempted to call pt, LVM for call back  °

## 2021-03-10 NOTE — Telephone Encounter (Signed)
I spoke to the patient. She is agreeable to the plan below. Discussed in detail and she correctly repeated back to me. I also confirmed with Dr. Krista Blue that she should continue Horizant 600mg  at bedtime.

## 2021-03-16 ENCOUNTER — Other Ambulatory Visit: Payer: Self-pay | Admitting: Neurology

## 2021-03-16 NOTE — Telephone Encounter (Signed)
Left message for a return call

## 2021-03-16 NOTE — Telephone Encounter (Signed)
Pt called needing to speak to the Rn regarding a switch in medication and going back to [facility]. Please advise.

## 2021-03-17 NOTE — Telephone Encounter (Signed)
Note from 03/10/21:  Please let her stop taking tizanidine       Meds ordered this encounter  Medications  ADD ON oxybutynin (DITROPAN-XL) 5 MG 24 hr tablet-- for Night time urinary frequency    Sig: Take 1 tablet (5 mg total) by mouth at bedtime.    Dispense:  30 tablet    Refill:  11  INCREASE gabapentin (NEURONTIN) 100 MG capsule TO HIGHT DOSE, UP TO 9 TABS DAILY FOR RESTLESS LEG SYMPTOMS, MAY TAKE ALL 9 TABS BEFORE BED IF NEEDED, BUT PREFER TITRATION DOSE SLOWLY    Sig: Take 3 capsules (300 mg total) by mouth 3 (three) times daily.    Dispense:  270 capsule    Refill:  11   Additionally, she has Horizant 600mg  to take at bedtime. ____________________________________ I spoke to her today. She is still have RLS symptoms during the night. However, she has only tried gabapentin 100mg , 4 caps QHS. She has not tried the higher dose provided. She will continue to titrate up the maximum allowed by Dr. Krista Blue (outlined above) to see if she can get more rest. She will call us back, if she continues to have problems.

## 2021-03-17 NOTE — Telephone Encounter (Signed)
Pt has called Marcelino Duster, RN back. Please call pt

## 2021-03-24 DIAGNOSIS — J0100 Acute maxillary sinusitis, unspecified: Secondary | ICD-10-CM | POA: Diagnosis not present

## 2021-04-01 MED ORDER — GABAPENTIN 300 MG PO CAPS
600.0000 mg | ORAL_CAPSULE | Freq: Every day | ORAL | 1 refills | Status: DC
Start: 2021-04-01 — End: 2021-11-29

## 2021-04-01 NOTE — Telephone Encounter (Signed)
Pt ask if can call in Gabapentin 300 mg so will not have to take so many pills Would like call from the nurse.

## 2021-04-01 NOTE — Addendum Note (Signed)
Addended by: Lester [place] A on: 04/01/2021 10:47 AM   Modules accepted: Orders

## 2021-04-01 NOTE — Telephone Encounter (Signed)
I called patient. She is doing well with gabapentin and she takes up to 9 capsules at night. She is wondering if we could change the RX to say gabapentin 300mg  taket 2-3 capsules QHS so that she wouldn't have to take so many pills. She is considering decreasing to gabapentin 600mg  to see how she does. Patient uses .

## 2021-04-05 ENCOUNTER — Ambulatory Visit
Admission: RE | Admit: 2021-04-05 | Discharge: 2021-04-05 | Disposition: A | Discharge status: Home / Self Care | Payer: PPO | Source: Ambulatory Visit | Attending: Physician Assistant | Admitting: Physician Assistant

## 2021-04-05 ENCOUNTER — Other Ambulatory Visit: Payer: Self-pay | Admitting: Physician Assistant

## 2021-04-05 ENCOUNTER — Other Ambulatory Visit: Payer: Self-pay

## 2021-04-05 DIAGNOSIS — R053 Chronic cough: Secondary | ICD-10-CM

## 2021-04-13 DIAGNOSIS — M21372 Foot drop, left foot: Secondary | ICD-10-CM | POA: Diagnosis not present

## 2021-04-13 DIAGNOSIS — M810 Age-related osteoporosis without current pathological fracture: Secondary | ICD-10-CM | POA: Diagnosis not present

## 2021-04-13 DIAGNOSIS — G35 Multiple sclerosis: Secondary | ICD-10-CM | POA: Diagnosis not present

## 2021-04-13 DIAGNOSIS — M8588 Other specified disorders of bone density and structure, other site: Secondary | ICD-10-CM | POA: Diagnosis not present

## 2021-04-13 DIAGNOSIS — Z780 Asymptomatic menopausal state: Secondary | ICD-10-CM | POA: Diagnosis not present

## 2021-04-13 DIAGNOSIS — M21371 Foot drop, right foot: Secondary | ICD-10-CM | POA: Diagnosis not present

## 2021-04-20 ENCOUNTER — Telehealth: Payer: Self-pay | Admitting: Neurology

## 2021-04-20 NOTE — Telephone Encounter (Signed)
Says duloxetine 30mg  daily keeps her symptoms tolerable (achy pain). Reports the 60mg  daily is causing her to feel "uptight". She would like a new prescription for the lower dose.  ? ?Also, she has stopped oxybutynin. Felt it was not helpful for her bladder issues. She is going to follow up w/ her urologist, Dr. Junious Silk.  ? ? ? ?

## 2021-04-20 NOTE — Telephone Encounter (Signed)
Pt ask to decrease Duloxetine back to 30 mg. Pt stated, felt better at 30 mg. If you switch back to 30 mg, will need refill. Would like a call back. ?

## 2021-04-21 DIAGNOSIS — L818 Other specified disorders of pigmentation: Secondary | ICD-10-CM | POA: Diagnosis not present

## 2021-04-21 DIAGNOSIS — Z23 Encounter for immunization: Secondary | ICD-10-CM | POA: Diagnosis not present

## 2021-04-21 DIAGNOSIS — D692 Other nonthrombocytopenic purpura: Secondary | ICD-10-CM | POA: Diagnosis not present

## 2021-04-21 MED ORDER — DULOXETINE HCL 30 MG PO CPEP: 30.0000 mg | ORAL_CAPSULE | Freq: Every day | ORAL | 3 refills | Status: DC

## 2021-04-21 NOTE — Telephone Encounter (Signed)
Meds ordered this encounter  ?Medications  ? DULoxetine (CYMBALTA) 30 MG capsule  ?  Sig: Take 1 capsule (30 mg total) by mouth daily.  ?  Dispense:  90 capsule  ?  Refill:  3  ?   ?

## 2021-04-21 NOTE — Addendum Note (Signed)
Addended by: Marcial Pacas on: 04/21/2021 10:45 AM ? ? Modules accepted: Orders ? ?

## 2021-04-21 NOTE — Telephone Encounter (Signed)
I called the patient to left her know the new rx has been sent to the pharmacy.  ?

## 2021-05-19 DIAGNOSIS — R5383 Other fatigue: Secondary | ICD-10-CM | POA: Diagnosis not present

## 2021-05-19 DIAGNOSIS — M810 Age-related osteoporosis without current pathological fracture: Secondary | ICD-10-CM | POA: Diagnosis not present

## 2021-05-19 DIAGNOSIS — E559 Vitamin D deficiency, unspecified: Secondary | ICD-10-CM | POA: Diagnosis not present

## 2021-05-24 ENCOUNTER — Telehealth: Payer: Self-pay | Admitting: Neurology

## 2021-05-24 NOTE — Telephone Encounter (Signed)
Pt wanted to inform office she has went back up to taking 60mg  of DULoxetine (CYMBALTA) 30 MG capsule.  ? ?Pt went back on muscle relaxer tizanidine 2mg  and is taking every 6 hours.  ? ?Pt taking gabapentin (NEURONTIN) 300 MG capsule twice a day.  ?

## 2021-05-25 DIAGNOSIS — E559 Vitamin D deficiency, unspecified: Secondary | ICD-10-CM | POA: Diagnosis not present

## 2021-05-25 DIAGNOSIS — M810 Age-related osteoporosis without current pathological fracture: Secondary | ICD-10-CM | POA: Diagnosis not present

## 2021-05-30 ENCOUNTER — Other Ambulatory Visit: Payer: Self-pay | Admitting: Endocrinology

## 2021-05-31 DIAGNOSIS — F419 Anxiety disorder, unspecified: Secondary | ICD-10-CM | POA: Diagnosis not present

## 2021-05-31 DIAGNOSIS — D508 Other iron deficiency anemias: Secondary | ICD-10-CM | POA: Diagnosis not present

## 2021-05-31 DIAGNOSIS — G35 Multiple sclerosis: Secondary | ICD-10-CM | POA: Diagnosis not present

## 2021-05-31 DIAGNOSIS — Z136 Encounter for screening for cardiovascular disorders: Secondary | ICD-10-CM | POA: Diagnosis not present

## 2021-05-31 DIAGNOSIS — Z13220 Encounter for screening for lipoid disorders: Secondary | ICD-10-CM | POA: Diagnosis not present

## 2021-05-31 DIAGNOSIS — M797 Fibromyalgia: Secondary | ICD-10-CM | POA: Diagnosis not present

## 2021-05-31 DIAGNOSIS — M1712 Unilateral primary osteoarthritis, left knee: Secondary | ICD-10-CM | POA: Diagnosis not present

## 2021-05-31 DIAGNOSIS — G259 Extrapyramidal and movement disorder, unspecified: Secondary | ICD-10-CM | POA: Diagnosis not present

## 2021-05-31 DIAGNOSIS — N183 Chronic kidney disease, stage 3 unspecified: Secondary | ICD-10-CM | POA: Diagnosis not present

## 2021-05-31 DIAGNOSIS — E2740 Unspecified adrenocortical insufficiency: Secondary | ICD-10-CM | POA: Diagnosis not present

## 2021-05-31 DIAGNOSIS — G8929 Other chronic pain: Secondary | ICD-10-CM | POA: Diagnosis not present

## 2021-05-31 DIAGNOSIS — Z0000 Encounter for general adult medical examination without abnormal findings: Secondary | ICD-10-CM | POA: Diagnosis not present

## 2021-05-31 DIAGNOSIS — F5101 Primary insomnia: Secondary | ICD-10-CM | POA: Diagnosis not present

## 2021-06-09 DIAGNOSIS — L308 Other specified dermatitis: Secondary | ICD-10-CM | POA: Diagnosis not present

## 2021-06-09 DIAGNOSIS — L728 Other follicular cysts of the skin and subcutaneous tissue: Secondary | ICD-10-CM | POA: Diagnosis not present

## 2021-06-09 DIAGNOSIS — T148XXA Other injury of unspecified body region, initial encounter: Secondary | ICD-10-CM | POA: Diagnosis not present

## 2021-06-21 DIAGNOSIS — M1712 Unilateral primary osteoarthritis, left knee: Secondary | ICD-10-CM | POA: Diagnosis not present

## 2021-06-22 ENCOUNTER — Other Ambulatory Visit: Payer: Self-pay | Admitting: Neurological Surgery

## 2021-06-22 DIAGNOSIS — M5416 Radiculopathy, lumbar region: Secondary | ICD-10-CM

## 2021-06-22 DIAGNOSIS — M21371 Foot drop, right foot: Secondary | ICD-10-CM | POA: Diagnosis not present

## 2021-06-22 DIAGNOSIS — M21372 Foot drop, left foot: Secondary | ICD-10-CM | POA: Diagnosis not present

## 2021-06-24 ENCOUNTER — Other Ambulatory Visit (INDEPENDENT_AMBULATORY_CARE_PROVIDER_SITE_OTHER): Payer: PPO

## 2021-06-24 DIAGNOSIS — E2749 Other adrenocortical insufficiency: Secondary | ICD-10-CM

## 2021-06-24 LAB — BASIC METABOLIC PANEL
BUN: 31 mg/dL — ABNORMAL HIGH (ref 6–23)
CO2: 28 mEq/L (ref 19–32)
Calcium: 10.3 mg/dL (ref 8.4–10.5)
Chloride: 103 mEq/L (ref 96–112)
Creatinine, Ser: 0.91 mg/dL (ref 0.40–1.20)
GFR: 62.48 mL/min (ref >60.00)
Glucose, Bld: 105 mg/dL — ABNORMAL HIGH (ref 70–99)
Potassium: 4.3 mEq/L (ref 3.5–5.1)
Sodium: 138 mEq/L (ref 135–145)

## 2021-06-28 ENCOUNTER — Ambulatory Visit (INDEPENDENT_AMBULATORY_CARE_PROVIDER_SITE_OTHER): Payer: PPO | Admitting: Endocrinology

## 2021-06-28 ENCOUNTER — Encounter: Payer: Self-pay | Admitting: Endocrinology

## 2021-06-28 VITALS — BP 110/55 | HR 100 | Ht 60.0 in | Wt 116.4 lb

## 2021-06-28 DIAGNOSIS — E2749 Other adrenocortical insufficiency: Secondary | ICD-10-CM | POA: Diagnosis not present

## 2021-06-28 DIAGNOSIS — I951 Orthostatic hypotension: Secondary | ICD-10-CM | POA: Diagnosis not present

## 2021-06-28 DIAGNOSIS — R5382 Chronic fatigue, unspecified: Secondary | ICD-10-CM | POA: Diagnosis not present

## 2021-06-28 NOTE — Progress Notes (Signed)
Patient ID: Katrina Rivera, female   DOB: Oct 21, 1945, 76 y.o.   MRN: [id number]   Subjective:      Chief complaint: Endocrinology follow-up  PROBLEM 1: Dysautonomic orthostatic hypotension   PAST history: She has had long-standing problems with orthostatic hypotension and also hyponatremia. Has been diagnosed with dysautonomia and has multiple other problems related to autonomic neuropathy.   She has been on Florinef since 2004 previously taking 3 tablets daily along with 5 tablets of potassium which had previously controlled her symptoms well . Because of persistent orthostatic symptoms she had been tried on midodrine on 05/28/12 but this was later stopped when blood pressure increased.  She  had medication adjustments done frequently over the last year for regulating her blood pressure.  RECENT history:   She has  been on variable doses of Florinef long-term, as much as 3 a day previously,   Currently taking a stable dose of only 1 tablet 3 days a week  She only has rare feelings of lightheadedness and blood pressure may be lower and will take an extra Florinef if needed  She is checking her blood pressure regularly with an [employer] meter Again keeping a track of her blood sugars on an app on her phone  Has checked readings sitting and standing Recent results were reviewed  As before standing blood pressure systolic reading is usually a little lower than sitting readings  Recently most of her blood pressure readings have been in the 120-130 range also rarely below 120 standing recently  Blood pressure was high on first measurement today  Potassium has been consistently normal without any supplements    Lab Results  Component Value Date   CREATININE 0.91 06/24/2021   BUN 31 (H) 06/24/2021   NA 138 06/24/2021   K 4.3 06/24/2021   CL 103 06/24/2021   CO2 28 06/24/2021     Adrenal Insufficiency:   This is secondary to pituitary dysfunction  Prior testing  included Cortrosyn test showing stimulated level of 15.7, baseline 3.9.  Also confirmed by 24 hour urine free cortisol which was only 3.0.  Although previously she had tolerated oral hydrocortisone and small doses with improvement in her symptoms she did not continue this long-term She was again symptomatic with weight loss, decreased appetite, nausea and also diarrhea Cortrosyn stimulation test on 03/12/14 showed baseline cortisol level of 0.3 and post injection of 1.1 only  She had GI side effects from hydrocortisone and prednisone and was in the past using hydrocortisone injections using insulin syringe   RECENT HISTORY:  She feels well on methylprednisolone without any nausea or weight loss She does tend to have fatigue chronically She is taking 4 mg in the early morning and half tablet in the late afternoon very regularly Did not feel as well when she tried to reduce the dose  Also is aware of needing to take stress doses for acute illnesses  Wt Readings from Last 3 Encounters:  06/28/21 116 lb 6.4 oz (52.8 kg)  03/04/21 116 lb (52.6 kg)  03/02/21 116 lb (52.6 kg)     General Endocrinology:   HYPERCALCEMIA:  She has had intermittent hypercalcemia since at least 2018, previously only temporarily associated with renal dysfunction Her PTH level was 31 previously and subsequently 23 and 38 1, 25 vitamin D level is normal  However her calcium is variable, now upper normal  She is not on calcium supplements  She does have osteoporosis as of 2016, taking Prolia from  orthopedic surgeon every 6 months She was told by orthopedic surgeon to take vitamin D   Last bone density in 2020 showed improved BMD at the hip and spine Recent bone density result not available for review  Lab Results  Component Value Date   PTH 38 06/01/2020   PTH Comment 06/01/2020   CALCIUM 10.3 06/24/2021   PHOS 1.9 (L) 07/23/2006     Allergies as of 06/28/2021       Reactions   Aspirin    bruising    Azithromycin    Other reaction(s): stomach upset   Doxycycline Hyclate    Other reaction(s): GI upset   Tizanidine Other (See Comments)   Reported feeling jittery on this medication.   Buspirone Hcl Palpitations   Mirtazapine Palpitations        Medication List        Accurate as of Jun 28, 2021  1:34 PM. If you have any questions, ask your nurse or doctor.          acetaminophen 500 MG tablet Commonly known as: TYLENOL Take 1,000 mg by mouth at bedtime.   CRANBERRY PO Take 2 tablets by mouth in the morning.   dicyclomine 10 MG capsule Commonly known as: BENTYL Take 10 mg by mouth at bedtime.   DULoxetine 30 MG capsule Commonly known as: Cymbalta Take 1 capsule (30 mg total) by mouth daily.   Ferrous Sulfate Dried 200 (65 Fe) MG Tabs Take 65 mg by mouth in the morning.   fludrocortisone 0.1 MG tablet Commonly known as: FLORINEF Take 1 tablet 3 days a week   fluticasone 50 MCG/ACT nasal spray Commonly known as: FLONASE Place 1 spray into both nostrils daily as needed for allergies or rhinitis.   gabapentin 300 MG capsule Commonly known as: NEURONTIN Take 2-3 capsules (600-900 mg total) by mouth at bedtime.   Horizant 600 MG Tbcr Generic drug: Gabapentin Enacarbil Take 1 tablet (600 mg total) by mouth at bedtime.   methylPREDNISolone 4 MG tablet Commonly known as: MEDROL TAKE 1 TABLET (4MG) AT BREAKFAST AND 1/2 TABLET (2MG) AT 4PM.   mirtazapine 15 MG tablet Commonly known as: REMERON Take 15 mg by mouth at bedtime.   multivitamin tablet Take 1 tablet by mouth in the morning.   OVER THE COUNTER MEDICATION Take 1 capsule by mouth at bedtime. Sleep 3   polyethylene glycol 17 g packet Commonly known as: MiraLax Take 17 g by mouth 2 (two) times daily. 17 grams in 6 oz of favorite drink twice a day until bowel movement.  LAXITIVE.  Restart if two days since last bowel movement   SYSTANE OP Place 2 drops into both eyes daily as needed (for dry  eyes).   tamsulosin 0.4 MG Caps capsule Commonly known as: FLOMAX 1 capsule   UNABLE TO FIND Take 1 tablet by mouth daily. Med Name: OTC Sleep 3   VITAMIN B COMPLEX PO Take 1 capsule by mouth in the morning.   VITAMIN C PO Take 1 tablet by mouth daily.   VITAMIN D3 PO Take 1 tablet by mouth in the morning.        Review of Systems    Genitourinary: She has had difficulty emptying her bladder and is being treated by urologist   OSTEOPOROSIS: This is  being treated by her orthopedic surgeon with Prolia  Previous bone density in 2016 showed the following: AP LUMBAR SPINE L1 through L4 Young Adult T-Score:  -2.4 LEFT FEMUR NECK: Young Adult   T-Score: -3.1  Last bone density in 12/20 showed improvement in her T-scores up to -2.7 at the right femur and - 3.6 at the radius Previously in 12/2016 has T score of -3.3 at the dual femur neck right and -2.3 for the L1-L3  Vitamin D deficiency: She is on 1000 units vitamin D3      Objective:   Physical Exam  BP 138/64 (Patient Position: Standing)   Pulse 100   Ht 5' (1.524 m)   Wt 116 lb 6.4 oz (52.8 kg)   SpO2 95%   BMI 22.73 kg/m      Assessment:        Orthostatic dysautonomic hypotension syndrome:   Her orthostatic hypotension has been now consistently well controlled For some time has been taking 0.1 mg Florinef 3 days a week  Home readings are generally not below normal Regular measurements including standing  Blood pressure in the office was lower on the second measurement but she is asymptomatic with a systolic of 110 today   Adrenal insufficiency:  She has long-standing secondary adrenal insufficiency with fairly typical symptoms at baseline and very low cortisol levels on the stimulation test Again symptoms are consistently well controlled on methylprednisolone 4 mg in the morning and 2 mg in the evening She will continue the same dosage    Hypercalcemia: Calcium is now better although variable  this year Likely has mild hyperparathyroidism   She is being treated for osteoporosis with Prolia by her orthopedic specialist with improvement in her bone density on last measurement in 2020 Recent levels not available  Fatigue: This is unexplained by any endocrine problems   Plan:        Continue Florinef 0.1 mg 3 days a week  Again she can take an extra tablet if her blood pressure is dropping with symptoms of lightheadedness No change in methylprednisolone regimen   Further recommendations as above Discussed need to continue Prolia long-term  For her hyperparathyroidism would not recommend surgery unless she has worsening of osteoporosis    Katrina Rivera  Note: This office note was prepared with Dragon voice recognition system technology. Any transcriptional errors that result from this process are unintentional.    

## 2021-06-30 ENCOUNTER — Ambulatory Visit
Admission: RE | Admit: 2021-06-30 | Discharge: 2021-06-30 | Disposition: A | Discharge status: Home / Self Care | Payer: PPO | Source: Ambulatory Visit | Attending: Neurological Surgery | Admitting: Neurological Surgery

## 2021-06-30 DIAGNOSIS — M5416 Radiculopathy, lumbar region: Secondary | ICD-10-CM | POA: Diagnosis not present

## 2021-06-30 DIAGNOSIS — M48061 Spinal stenosis, lumbar region without neurogenic claudication: Secondary | ICD-10-CM | POA: Diagnosis not present

## 2021-06-30 DIAGNOSIS — M4316 Spondylolisthesis, lumbar region: Secondary | ICD-10-CM | POA: Diagnosis not present

## 2021-07-13 ENCOUNTER — Other Ambulatory Visit: Payer: Self-pay | Admitting: Neurological Surgery

## 2021-07-13 ENCOUNTER — Other Ambulatory Visit: Payer: Self-pay

## 2021-07-13 DIAGNOSIS — I951 Orthostatic hypotension: Secondary | ICD-10-CM

## 2021-07-13 DIAGNOSIS — E2749 Other adrenocortical insufficiency: Secondary | ICD-10-CM

## 2021-07-13 DIAGNOSIS — M21371 Foot drop, right foot: Secondary | ICD-10-CM | POA: Diagnosis not present

## 2021-07-13 DIAGNOSIS — M4316 Spondylolisthesis, lumbar region: Secondary | ICD-10-CM | POA: Diagnosis not present

## 2021-07-13 MED ORDER — FLUDROCORTISONE ACETATE 0.1 MG PO TABS: ORAL_TABLET | ORAL | 2 refills | Status: DC

## 2021-07-13 MED ORDER — METHYLPREDNISOLONE 4 MG PO TABS: ORAL_TABLET | ORAL | 2 refills | Status: DC

## 2021-07-23 ENCOUNTER — Other Ambulatory Visit: Payer: Self-pay | Admitting: Neurological Surgery

## 2021-07-23 NOTE — Progress Notes (Signed)
Surgical Instructions    Your procedure is scheduled on Friday June 23.  Report to [facility] Main Entrance "A" at 5:30 A.M., then check in with the Admitting office.  Call this number if you have problems the morning of surgery:  [phone]   If you have any questions prior to your surgery date call [phone]: Open Monday-Friday 8am-4pm    Remember:  Do not eat or drink anything after midnight the night before your surgery     Take these medicines the morning of surgery with A SIP OF WATER:  alfuzosin (UROXATRAL)  dicyclomine (BENTYL) DULoxetine (CYMBALTA) methylPREDNISolone (MEDROL)  If needed you may take: acetaminophen (TYLENOL) fluticasone (FLONASE) promethazine (PHENERGAN tiZANidine (ZANAFLEX)  As of today, STOP taking any Aspirin (unless otherwise instructed by your surgeon) Aleve, Naproxen, Ibuprofen, Motrin, Advil, Goody's, BC's, all herbal medications, fish oil, and all vitamins.           Do not wear jewelry or makeup Do not wear lotions, powders, perfumes/colognes, or deodorant. Do not shave 48 hours prior to surgery.  Men may shave face and neck. Do not bring valuables to the hospital. Do not wear nail polish, gel polish, artificial nails, or any other type of covering on natural nails (fingers and toes) If you have artificial nails or gel coating that need to be removed by a nail salon, please have this removed prior to surgery. Artificial nails or gel coating may interfere with anesthesia's ability to adequately monitor your vital signs.  [facility] is not responsible for any belongings or valuables. .   Do NOT Smoke (Tobacco/Vaping)  24 hours prior to your procedure  If you use a CPAP at night, you may bring your mask for your overnight stay.   Contacts, glasses, hearing aids, dentures or partials may not be worn into surgery, please bring cases for these belongings   For patients admitted to the hospital, discharge time will be determined by your  treatment team.   Patients discharged the day of surgery will not be allowed to drive home, and someone needs to stay with them for 24 hours.   SURGICAL WAITING ROOM VISITATION Patients having surgery or a procedure in a hospital may have two support people. Children under the age of 29 must have an adult with them who is not the patient. They may stay in the waiting area during the procedure and may switch out with other visitors. If the patient needs to stay at the hospital during part of their recovery, the visitor guidelines for inpatient rooms apply.  Please refer to the [facility] website for the visitor guidelines for Inpatients (after your surgery is over and you are in a regular room).       Special instructions:    Oral Hygiene is also important to reduce your risk of infection.  Remember - BRUSH YOUR TEETH THE MORNING OF SURGERY WITH YOUR REGULAR TOOTHPASTE   [facility]- Preparing For Surgery  Before surgery, you can play an important role. Because skin is not sterile, your skin needs to be as free of germs as possible. You can reduce the number of germs on your skin by washing with CHG (chlorahexidine gluconate) Soap before surgery.  CHG is an antiseptic cleaner which kills germs and bonds with the skin to continue killing germs even after washing.     Please do not use if you have an allergy to CHG or antibacterial soaps. If your skin becomes reddened/irritated stop using the CHG.  Do not  shave (including legs and underarms) for at least 48 hours prior to first CHG shower. It is OK to shave your face.  Please follow these instructions carefully.     Shower the NIGHT BEFORE SURGERY and the MORNING OF SURGERY with CHG Soap.   If you chose to wash your hair, wash your hair first as usual with your normal shampoo. After you shampoo, rinse your hair and body thoroughly to remove the shampoo.  Then [employer] and genitals (private parts) with your normal soap and rinse  thoroughly to remove soap.  After that Use CHG Soap as you would any other liquid soap. You can apply CHG directly to the skin and wash gently with a scrungie or a clean washcloth.   Apply the CHG Soap to your body ONLY FROM THE NECK DOWN.  Do not use on open wounds or open sores. Avoid contact with your eyes, ears, mouth and genitals (private parts). Wash Face and genitals (private parts)  with your normal soap.   Wash thoroughly, paying special attention to the area where your surgery will be performed.  Thoroughly rinse your body with warm water from the neck down.  DO NOT shower/wash with your normal soap after using and rinsing off the CHG Soap.  Pat yourself dry with a CLEAN TOWEL.  Wear CLEAN PAJAMAS to bed the night before surgery  Place CLEAN SHEETS on your bed the night before your surgery  DO NOT SLEEP WITH PETS.   Day of Surgery:  Take a shower with CHG soap. Wear Clean/Comfortable clothing the morning of surgery Do not apply any deodorants/lotions.   Remember to brush your teeth WITH YOUR REGULAR TOOTHPASTE.    If you received a COVID test during your pre-op visit, it is requested that you wear a mask when out in public, stay away from anyone that may not be feeling well, and notify your surgeon if you develop symptoms. If you have been in contact with anyone that has tested positive in the last 10 days, please notify your surgeon.    Please read over the following fact sheets that you were given.

## 2021-07-26 ENCOUNTER — Other Ambulatory Visit: Payer: Self-pay

## 2021-07-26 ENCOUNTER — Other Ambulatory Visit (HOSPITAL_COMMUNITY): Payer: PPO

## 2021-07-26 ENCOUNTER — Encounter (HOSPITAL_COMMUNITY): Payer: Self-pay

## 2021-07-26 ENCOUNTER — Encounter (HOSPITAL_COMMUNITY)
Admission: RE | Admit: 2021-07-26 | Discharge: 2021-07-26 | Disposition: A | Discharge status: Home / Self Care | Payer: PPO | Source: Ambulatory Visit | Attending: Neurological Surgery | Admitting: Neurological Surgery

## 2021-07-26 VITALS — BP 153/63 | HR 86 | Temp 98.1°F | Resp 17 | Ht 60.0 in | Wt 115.7 lb

## 2021-07-26 DIAGNOSIS — Z01818 Encounter for other preprocedural examination: Secondary | ICD-10-CM | POA: Insufficient documentation

## 2021-07-26 DIAGNOSIS — I2510 Atherosclerotic heart disease of native coronary artery without angina pectoris: Secondary | ICD-10-CM | POA: Insufficient documentation

## 2021-07-26 DIAGNOSIS — Z01812 Encounter for preprocedural laboratory examination: Secondary | ICD-10-CM | POA: Diagnosis present

## 2021-07-26 DIAGNOSIS — M4316 Spondylolisthesis, lumbar region: Secondary | ICD-10-CM | POA: Diagnosis not present

## 2021-07-26 LAB — BASIC METABOLIC PANEL
Anion gap: 8 (ref 5–15)
BUN: 18 mg/dL (ref 8–23)
CO2: 27 mmol/L (ref 22–32)
Calcium: 9.9 mg/dL (ref 8.9–10.3)
Chloride: 104 mmol/L (ref 98–111)
Creatinine, Ser: 0.83 mg/dL (ref 0.44–1.00)
GFR, Estimated: >60 mL/min (ref >60)
Glucose, Bld: 89 mg/dL (ref 70–99)
Potassium: 3.6 mmol/L (ref 3.5–5.1)
Sodium: 139 mmol/L (ref 135–145)

## 2021-07-26 LAB — TYPE AND SCREEN
ABO/RH(D): A POS
Antibody Screen: NEGATIVE

## 2021-07-26 LAB — CBC
HCT: 37.4 % (ref 36.0–46.0)
Hemoglobin: 11.7 g/dL — ABNORMAL LOW (ref 12.0–15.0)
MCH: 31.5 pg (ref 26.0–34.0)
MCHC: 31.3 g/dL (ref 30.0–36.0)
MCV: 100.8 fL — ABNORMAL HIGH (ref 80.0–100.0)
Platelets: 192 10*3/uL (ref 150–400)
RBC: 3.71 MIL/uL — ABNORMAL LOW (ref 3.87–5.11)
RDW: 13 % (ref 11.5–15.5)
WBC: 9.2 10*3/uL (ref 4.0–10.5)
nRBC: 0 % (ref 0.0–0.2)

## 2021-07-26 LAB — SURGICAL PCR SCREEN
MRSA, PCR: NEGATIVE
Staphylococcus aureus: NEGATIVE

## 2021-07-26 LAB — PROTIME-INR
INR: 0.9 (ref 0.8–1.2)
Prothrombin Time: 12 seconds (ref 11.4–15.2)

## 2021-07-26 NOTE — Progress Notes (Signed)
PCP - Dr. [name] Cardiologist - denies  PPM/ICD - N/A  Chest x-ray - n/a EKG - 08/13/20 Stress Test - denies ECHO - denies Cardiac Cath - denies  Sleep Study - denies CPAP - denies  Blood Thinner Instructions: n/a Aspirin Instructions: n/a  NPO  COVID TEST- n/a  Anesthesia review: No  Patient denies shortness of breath, fever, cough and chest pain at PAT appointment   All instructions explained to the patient, with a verbal understanding of the material. Patient agrees to go over the instructions while at home for a better understanding. Patient also instructed to self quarantine after being tested for COVID-19. The opportunity to ask questions was provided.

## 2021-07-30 ENCOUNTER — Ambulatory Visit (HOSPITAL_BASED_OUTPATIENT_CLINIC_OR_DEPARTMENT_OTHER): Payer: PPO | Admitting: Certified Registered Nurse Anesthetist

## 2021-07-30 ENCOUNTER — Observation Stay (HOSPITAL_COMMUNITY)
Admission: RE | Admit: 2021-07-30 | Discharge: 2021-07-31 | Disposition: A | Discharge status: Home Health | Payer: PPO | Attending: Neurological Surgery | Admitting: Neurological Surgery

## 2021-07-30 ENCOUNTER — Encounter (HOSPITAL_COMMUNITY): Payer: Self-pay | Admitting: Neurological Surgery

## 2021-07-30 ENCOUNTER — Ambulatory Visit (HOSPITAL_COMMUNITY): Payer: PPO | Admitting: Certified Registered Nurse Anesthetist

## 2021-07-30 ENCOUNTER — Encounter (HOSPITAL_COMMUNITY)
Admission: RE | Disposition: A | Discharge status: Home Health | Payer: Self-pay | Source: Home / Self Care | Attending: Neurological Surgery

## 2021-07-30 ENCOUNTER — Other Ambulatory Visit: Payer: Self-pay

## 2021-07-30 ENCOUNTER — Ambulatory Visit (HOSPITAL_COMMUNITY): Payer: PPO

## 2021-07-30 DIAGNOSIS — M4326 Fusion of spine, lumbar region: Secondary | ICD-10-CM | POA: Diagnosis not present

## 2021-07-30 DIAGNOSIS — Z4542 Encounter for adjustment and management of neuropacemaker (brain) (peripheral nerve) (spinal cord): Secondary | ICD-10-CM | POA: Diagnosis not present

## 2021-07-30 DIAGNOSIS — M21371 Foot drop, right foot: Secondary | ICD-10-CM | POA: Diagnosis not present

## 2021-07-30 DIAGNOSIS — M797 Fibromyalgia: Secondary | ICD-10-CM | POA: Diagnosis not present

## 2021-07-30 DIAGNOSIS — M4316 Spondylolisthesis, lumbar region: Secondary | ICD-10-CM

## 2021-07-30 DIAGNOSIS — Z79899 Other long term (current) drug therapy: Secondary | ICD-10-CM | POA: Insufficient documentation

## 2021-07-30 DIAGNOSIS — I10 Essential (primary) hypertension: Secondary | ICD-10-CM

## 2021-07-30 DIAGNOSIS — F418 Other specified anxiety disorders: Secondary | ICD-10-CM | POA: Diagnosis not present

## 2021-07-30 DIAGNOSIS — Z96652 Presence of left artificial knee joint: Secondary | ICD-10-CM | POA: Diagnosis not present

## 2021-07-30 DIAGNOSIS — M48061 Spinal stenosis, lumbar region without neurogenic claudication: Secondary | ICD-10-CM | POA: Diagnosis not present

## 2021-07-30 DIAGNOSIS — M5416 Radiculopathy, lumbar region: Secondary | ICD-10-CM | POA: Insufficient documentation

## 2021-07-30 DIAGNOSIS — Z981 Arthrodesis status: Secondary | ICD-10-CM

## 2021-07-30 HISTORY — PX: SPINAL CORD STIMULATOR BATTERY EXCHANGE: SHX6202

## 2021-07-30 SURGERY — POSTERIOR LUMBAR FUSION 1 LEVEL
Anesthesia: General | Site: Spine Lumbar | Laterality: Right

## 2021-07-30 MED ORDER — ACETAMINOPHEN 325 MG PO TABS
975.0000 mg | ORAL_TABLET | Freq: Two times a day (BID) | ORAL | Status: DC
  Administered 2021-07-30 – 2021-07-31 (×3): 975 mg via ORAL
  Filled 2021-07-30 (×3): qty 3

## 2021-07-30 MED ORDER — ASCORBIC ACID 500 MG PO TABS
500.0000 mg | ORAL_TABLET | Freq: Two times a day (BID) | ORAL | Status: DC
  Administered 2021-07-30 – 2021-07-31 (×2): 500 mg via ORAL
  Filled 2021-07-30 (×2): qty 1

## 2021-07-30 MED ORDER — CELECOXIB 200 MG PO CAPS
200.0000 mg | ORAL_CAPSULE | Freq: Two times a day (BID) | ORAL | Status: DC
  Administered 2021-07-30 – 2021-07-31 (×3): 200 mg via ORAL
  Filled 2021-07-30 (×3): qty 1

## 2021-07-30 MED ORDER — DEXAMETHASONE SODIUM PHOSPHATE 10 MG/ML IJ SOLN
INTRAMUSCULAR | Status: AC
  Filled 2021-07-30: qty 1

## 2021-07-30 MED ORDER — CEPHALEXIN 250 MG PO CAPS: 250.0000 mg | ORAL_CAPSULE | ORAL | Status: DC

## 2021-07-30 MED ORDER — HYDROCORTISONE SOD SUC (PF) 100 MG IJ SOLR
INTRAMUSCULAR | Status: DC | PRN
  Administered 2021-07-30: 250 mg via INTRAVENOUS

## 2021-07-30 MED ORDER — GABAPENTIN 300 MG PO CAPS
600.0000 mg | ORAL_CAPSULE | Freq: Every day | ORAL | Status: DC
  Administered 2021-07-30: 600 mg via ORAL
  Filled 2021-07-30: qty 2

## 2021-07-30 MED ORDER — ROCURONIUM BROMIDE 10 MG/ML (PF) SYRINGE
PREFILLED_SYRINGE | INTRAVENOUS | Status: DC | PRN
  Administered 2021-07-30 (×2): 20 mg via INTRAVENOUS
  Administered 2021-07-30: 50 mg via INTRAVENOUS
  Administered 2021-07-30: 10 mg via INTRAVENOUS

## 2021-07-30 MED ORDER — ONDANSETRON HCL 4 MG/2ML IJ SOLN
INTRAMUSCULAR | Status: DC | PRN
  Administered 2021-07-30: 4 mg via INTRAVENOUS

## 2021-07-30 MED ORDER — DEXAMETHASONE 4 MG PO TABS
4.0000 mg | ORAL_TABLET | Freq: Four times a day (QID) | ORAL | Status: DC
  Administered 2021-07-30 – 2021-07-31 (×4): 4 mg via ORAL
  Filled 2021-07-30 (×4): qty 1

## 2021-07-30 MED ORDER — ALFUZOSIN HCL ER 10 MG PO TB24
10.0000 mg | ORAL_TABLET | Freq: Every day | ORAL | Status: DC
  Administered 2021-07-31: 10 mg via ORAL
  Filled 2021-07-30: qty 1

## 2021-07-30 MED ORDER — ONDANSETRON HCL 4 MG/2ML IJ SOLN: 4.0000 mg | Freq: Four times a day (QID) | INTRAMUSCULAR | Status: DC | PRN

## 2021-07-30 MED ORDER — CHLORHEXIDINE GLUCONATE CLOTH 2 % EX PADS: 6.0000 | MEDICATED_PAD | Freq: Once | CUTANEOUS | Status: DC

## 2021-07-30 MED ORDER — PROPOFOL 10 MG/ML IV BOLUS
INTRAVENOUS | Status: DC | PRN
  Administered 2021-07-30: 50 mg via INTRAVENOUS
  Administered 2021-07-30: 150 mg via INTRAVENOUS

## 2021-07-30 MED ORDER — THROMBIN 5000 UNITS EX SOLR: OROMUCOSAL | Status: DC | PRN

## 2021-07-30 MED ORDER — SODIUM CHLORIDE 0.9% FLUSH: 3.0000 mL | INTRAVENOUS | Status: DC | PRN

## 2021-07-30 MED ORDER — LIDOCAINE 2% (20 MG/ML) 5 ML SYRINGE
INTRAMUSCULAR | Status: DC | PRN
  Administered 2021-07-30: 60 mg via INTRAVENOUS

## 2021-07-30 MED ORDER — FERROUS SULFATE 325 (65 FE) MG PO TABS
325.0000 mg | ORAL_TABLET | Freq: Every day | ORAL | Status: DC
  Administered 2021-07-31: 325 mg via ORAL
  Filled 2021-07-30: qty 1

## 2021-07-30 MED ORDER — KETOROLAC TROMETHAMINE 0.5 % OP SOLN
1.0000 [drp] | Freq: Four times a day (QID) | OPHTHALMIC | Status: DC
  Administered 2021-07-30: 1 [drp] via OPHTHALMIC

## 2021-07-30 MED ORDER — BUPIVACAINE HCL (PF) 0.25 % IJ SOLN
INTRAMUSCULAR | Status: DC | PRN
  Administered 2021-07-30 (×2): 10 mL

## 2021-07-30 MED ORDER — SENNA 8.6 MG PO TABS
1.0000 | ORAL_TABLET | Freq: Two times a day (BID) | ORAL | Status: DC
  Administered 2021-07-30 – 2021-07-31 (×3): 8.6 mg via ORAL
  Filled 2021-07-30 (×3): qty 1

## 2021-07-30 MED ORDER — CEFAZOLIN SODIUM-DEXTROSE 2-4 GM/100ML-% IV SOLN
2.0000 g | Freq: Three times a day (TID) | INTRAVENOUS | Status: AC
  Administered 2021-07-30 – 2021-07-31 (×2): 2 g via INTRAVENOUS
  Filled 2021-07-30 (×2): qty 100

## 2021-07-30 MED ORDER — ACETAMINOPHEN 500 MG PO TABS
1000.0000 mg | ORAL_TABLET | ORAL | Status: AC
  Administered 2021-07-30: 1000 mg via ORAL
  Filled 2021-07-30: qty 2

## 2021-07-30 MED ORDER — CHLORHEXIDINE GLUCONATE 0.12 % MT SOLN
15.0000 mL | Freq: Once | OROMUCOSAL | Status: AC
  Administered 2021-07-30: 15 mL via OROMUCOSAL
  Filled 2021-07-30: qty 15

## 2021-07-30 MED ORDER — FENTANYL CITRATE (PF) 100 MCG/2ML IJ SOLN
INTRAMUSCULAR | Status: AC
  Filled 2021-07-30: qty 2

## 2021-07-30 MED ORDER — GABAPENTIN 300 MG PO CAPS
300.0000 mg | ORAL_CAPSULE | ORAL | Status: AC
  Administered 2021-07-30: 300 mg via ORAL
  Filled 2021-07-30: qty 1

## 2021-07-30 MED ORDER — LIDOCAINE 2% (20 MG/ML) 5 ML SYRINGE
INTRAMUSCULAR | Status: AC
  Filled 2021-07-30: qty 5

## 2021-07-30 MED ORDER — KETOROLAC TROMETHAMINE 0.5 % OP SOLN
OPHTHALMIC | Status: AC
  Filled 2021-07-30: qty 5

## 2021-07-30 MED ORDER — THROMBIN 20000 UNITS EX SOLR: CUTANEOUS | Status: DC | PRN

## 2021-07-30 MED ORDER — FENTANYL CITRATE (PF) 100 MCG/2ML IJ SOLN
25.0000 ug | INTRAMUSCULAR | Status: DC | PRN
  Administered 2021-07-30: 25 ug via INTRAVENOUS

## 2021-07-30 MED ORDER — OXYCODONE HCL 5 MG PO TABS: 5.0000 mg | ORAL_TABLET | Freq: Once | ORAL | Status: DC | PRN

## 2021-07-30 MED ORDER — FENTANYL CITRATE (PF) 250 MCG/5ML IJ SOLN
INTRAMUSCULAR | Status: AC
  Filled 2021-07-30: qty 5

## 2021-07-30 MED ORDER — PHENYLEPHRINE 80 MCG/ML (10ML) SYRINGE FOR IV PUSH (FOR BLOOD PRESSURE SUPPORT)
PREFILLED_SYRINGE | INTRAVENOUS | Status: AC
  Filled 2021-07-30: qty 10

## 2021-07-30 MED ORDER — PHENYLEPHRINE HCL-NACL 20-0.9 MG/250ML-% IV SOLN
INTRAVENOUS | Status: DC | PRN
  Administered 2021-07-30: 40 ug/min via INTRAVENOUS

## 2021-07-30 MED ORDER — DEXAMETHASONE SODIUM PHOSPHATE 4 MG/ML IJ SOLN: 4.0000 mg | Freq: Four times a day (QID) | INTRAMUSCULAR | Status: DC

## 2021-07-30 MED ORDER — DULOXETINE HCL 30 MG PO CPEP
60.0000 mg | ORAL_CAPSULE | Freq: Every day | ORAL | Status: DC
  Administered 2021-07-31: 60 mg via ORAL
  Filled 2021-07-30: qty 2

## 2021-07-30 MED ORDER — FENTANYL CITRATE (PF) 250 MCG/5ML IJ SOLN
INTRAMUSCULAR | Status: DC | PRN
  Administered 2021-07-30: 100 ug via INTRAVENOUS
  Administered 2021-07-30 (×3): 50 ug via INTRAVENOUS

## 2021-07-30 MED ORDER — SODIUM CHLORIDE 0.9% FLUSH
3.0000 mL | Freq: Two times a day (BID) | INTRAVENOUS | Status: DC
  Administered 2021-07-30: 3 mL via INTRAVENOUS

## 2021-07-30 MED ORDER — ONDANSETRON HCL 4 MG/2ML IJ SOLN
INTRAMUSCULAR | Status: AC
  Filled 2021-07-30: qty 2

## 2021-07-30 MED ORDER — THROMBIN 5000 UNITS EX SOLR
CUTANEOUS | Status: AC
Start: 2021-07-30 — End: ?
  Filled 2021-07-30: qty 5000

## 2021-07-30 MED ORDER — OXYCODONE HCL 5 MG PO TABS
5.0000 mg | ORAL_TABLET | ORAL | Status: DC | PRN
  Administered 2021-07-30 – 2021-07-31 (×6): 5 mg via ORAL
  Filled 2021-07-30 (×6): qty 1

## 2021-07-30 MED ORDER — ONDANSETRON HCL 4 MG PO TABS
4.0000 mg | ORAL_TABLET | Freq: Four times a day (QID) | ORAL | Status: DC | PRN
  Administered 2021-07-30: 4 mg via ORAL
  Filled 2021-07-30: qty 1

## 2021-07-30 MED ORDER — POTASSIUM CHLORIDE IN NACL 20-0.9 MEQ/L-% IV SOLN: INTRAVENOUS | Status: DC

## 2021-07-30 MED ORDER — BUPIVACAINE HCL (PF) 0.25 % IJ SOLN
INTRAMUSCULAR | Status: AC
  Filled 2021-07-30: qty 30

## 2021-07-30 MED ORDER — 0.9 % SODIUM CHLORIDE (POUR BTL) OPTIME
TOPICAL | Status: DC | PRN
  Administered 2021-07-30: 1000 mL

## 2021-07-30 MED ORDER — GABAPENTIN ENACARBIL ER 600 MG PO TBCR: 1.0000 | EXTENDED_RELEASE_TABLET | Freq: Every day | ORAL | Status: DC

## 2021-07-30 MED ORDER — ADULT MULTIVITAMIN W/MINERALS CH
1.0000 | ORAL_TABLET | Freq: Every morning | ORAL | Status: DC
  Administered 2021-07-31: 1 via ORAL
  Filled 2021-07-30: qty 1

## 2021-07-30 MED ORDER — MORPHINE SULFATE (PF) 2 MG/ML IV SOLN: 2.0000 mg | INTRAVENOUS | Status: DC | PRN

## 2021-07-30 MED ORDER — VANCOMYCIN HCL 1000 MG IV SOLR
INTRAVENOUS | Status: AC
  Filled 2021-07-30: qty 20

## 2021-07-30 MED ORDER — ONDANSETRON HCL 4 MG/2ML IJ SOLN
4.0000 mg | Freq: Four times a day (QID) | INTRAMUSCULAR | Status: DC | PRN
Start: 2021-07-30 — End: 2021-07-31

## 2021-07-30 MED ORDER — OXYCODONE HCL 5 MG/5ML PO SOLN: 5.0000 mg | Freq: Once | ORAL | Status: DC | PRN

## 2021-07-30 MED ORDER — TIZANIDINE HCL 4 MG PO TABS
4.0000 mg | ORAL_TABLET | Freq: Every day | ORAL | Status: DC
  Administered 2021-07-30: 4 mg via ORAL
  Filled 2021-07-30: qty 1

## 2021-07-30 MED ORDER — ORAL CARE MOUTH RINSE: 15.0000 mL | Freq: Once | OROMUCOSAL | Status: AC

## 2021-07-30 MED ORDER — VANCOMYCIN HCL 1500 MG/300ML IV SOLN
1500.0000 mg | INTRAVENOUS | Status: AC
  Administered 2021-07-30 (×2): 1500 mg via INTRAVENOUS
  Filled 2021-07-30: qty 300

## 2021-07-30 MED ORDER — PHENYLEPHRINE 80 MCG/ML (10ML) SYRINGE FOR IV PUSH (FOR BLOOD PRESSURE SUPPORT)
PREFILLED_SYRINGE | INTRAVENOUS | Status: DC | PRN
  Administered 2021-07-30: 80 ug via INTRAVENOUS

## 2021-07-30 MED ORDER — SODIUM CHLORIDE 0.9 % IV SOLN: 250.0000 mL | INTRAVENOUS | Status: DC

## 2021-07-30 MED ORDER — MENTHOL 3 MG MT LOZG: 1.0000 | LOZENGE | OROMUCOSAL | Status: DC | PRN

## 2021-07-30 MED ORDER — PROBIOTIC 250 MG PO CAPS: ORAL_CAPSULE | Freq: Every day | ORAL | Status: DC

## 2021-07-30 MED ORDER — PHENOL 1.4 % MT LIQD: 1.0000 | OROMUCOSAL | Status: DC | PRN

## 2021-07-30 MED ORDER — LACTATED RINGERS IV SOLN: INTRAVENOUS | Status: DC

## 2021-07-30 MED ORDER — THROMBIN 20000 UNITS EX SOLR
CUTANEOUS | Status: AC
  Filled 2021-07-30: qty 20000

## 2021-07-30 MED ORDER — ROCURONIUM BROMIDE 10 MG/ML (PF) SYRINGE
PREFILLED_SYRINGE | INTRAVENOUS | Status: AC
  Filled 2021-07-30: qty 10

## 2021-07-30 MED ORDER — FLUDROCORTISONE ACETATE 0.1 MG PO TABS
0.1000 mg | ORAL_TABLET | ORAL | Status: DC
Start: 2021-07-31 — End: 2021-07-31

## 2021-07-30 MED ORDER — SUGAMMADEX SODIUM 200 MG/2ML IV SOLN
INTRAVENOUS | Status: DC | PRN
  Administered 2021-07-30: 200 mg via INTRAVENOUS

## 2021-07-30 MED ORDER — DICYCLOMINE HCL 10 MG PO CAPS
10.0000 mg | ORAL_CAPSULE | Freq: Two times a day (BID) | ORAL | Status: DC
  Administered 2021-07-30 – 2021-07-31 (×2): 10 mg via ORAL
  Filled 2021-07-30 (×2): qty 1

## 2021-07-30 SURGICAL SUPPLY — 67 items
ADH SKN CLS APL DERMABOND .7 (GAUZE/BANDAGES/DRESSINGS) ×2
APL SKNCLS STERI-STRIP NONHPOA (GAUZE/BANDAGES/DRESSINGS) ×4
BAG COUNTER SPONGE SURGICOUNT (BAG) ×8 IMPLANT
BAG SPNG CNTER NS LX DISP (BAG) ×4
BASKET BONE COLLECTION (BASKET) ×4 IMPLANT
BENZOIN TINCTURE PRP APPL 2/3 (GAUZE/BANDAGES/DRESSINGS) ×8 IMPLANT
BLADE BONE MILL MEDIUM (MISCELLANEOUS) ×4 IMPLANT
BONE MATRIX OSTEOCEL PRO LRG (Bone Implant) ×1 IMPLANT
BUR CARBIDE MATCH 3.0 (BURR) ×4 IMPLANT
CAGE MAS PLIF 9X9X23-8 LUMBAR (Cage) ×2 IMPLANT
CANISTER SUCT 3000ML PPV (MISCELLANEOUS) ×7 IMPLANT
CNTNR URN SCR LID CUP LEK RST (MISCELLANEOUS) ×3 IMPLANT
CONT SPEC 4OZ STRL OR WHT (MISCELLANEOUS) ×3
CONTROL REMOTE FREELINK ALPHA (NEUROSURGERY SUPPLIES) ×1 IMPLANT
COVER BACK TABLE 60X90IN (DRAPES) ×4 IMPLANT
DERMABOND ADVANCED (GAUZE/BANDAGES/DRESSINGS) ×1
DERMABOND ADVANCED .7 DNX12 (GAUZE/BANDAGES/DRESSINGS) ×3 IMPLANT
DRAPE C-ARM 42X72 X-RAY (DRAPES) ×8 IMPLANT
DRAPE LAPAROTOMY 100X72X124 (DRAPES) ×8 IMPLANT
DRSG OPSITE POSTOP 4X6 (GAUZE/BANDAGES/DRESSINGS) ×2 IMPLANT
DURAPREP 26ML APPLICATOR (WOUND CARE) ×8 IMPLANT
ELECT REM PT RETURN 9FT ADLT (ELECTROSURGICAL) ×6
ELECTRODE REM PT RTRN 9FT ADLT (ELECTROSURGICAL) ×6 IMPLANT
EVACUATOR 1/8 PVC DRAIN (DRAIN) ×3 IMPLANT
GLOVE BIO SURGEON STRL SZ7 (GLOVE) ×1 IMPLANT
GLOVE BIO SURGEON STRL SZ8 (GLOVE) ×11 IMPLANT
GLOVE BIOGEL PI IND STRL 7.0 (GLOVE) IMPLANT
GLOVE BIOGEL PI IND STRL 7.5 (GLOVE) IMPLANT
GLOVE BIOGEL PI INDICATOR 7.0 (GLOVE) ×1
GLOVE BIOGEL PI INDICATOR 7.5 (GLOVE) ×3
GLOVE SURG SS PI 7.0 STRL IVOR (GLOVE) ×4 IMPLANT
GOWN STRL REUS W/ TWL LRG LVL3 (GOWN DISPOSABLE) IMPLANT
GOWN STRL REUS W/ TWL XL LVL3 (GOWN DISPOSABLE) ×9 IMPLANT
GOWN STRL REUS W/TWL 2XL LVL3 (GOWN DISPOSABLE) IMPLANT
GOWN STRL REUS W/TWL LRG LVL3 (GOWN DISPOSABLE) ×6
GOWN STRL REUS W/TWL XL LVL3 (GOWN DISPOSABLE) ×9
HEMOSTAT POWDER KIT SURGIFOAM (HEMOSTASIS) ×4 IMPLANT
KIT BASIN OR (CUSTOM PROCEDURE TRAY) ×7 IMPLANT
KIT CHARGING (KITS) ×1
KIT CHARGING PRECISION NEURO (KITS) IMPLANT
KIT GRAFTMAG DEL NEURO DISP (NEUROSURGERY SUPPLIES) IMPLANT
KIT IPG ALPHA WAVEWRITER (Stimulator) ×1 IMPLANT
KIT TURNOVER KIT B (KITS) ×7 IMPLANT
MILL BONE PREP (MISCELLANEOUS) ×4 IMPLANT
NDL HYPO 25X1 1.5 SAFETY (NEEDLE) ×6 IMPLANT
NEEDLE HYPO 25X1 1.5 SAFETY (NEEDLE) ×3 IMPLANT
NS IRRIG 1000ML POUR BTL (IV SOLUTION) ×7 IMPLANT
PACK LAMINECTOMY NEURO (CUSTOM PROCEDURE TRAY) ×7 IMPLANT
PAD ARMBOARD 7.5X6 YLW CONV (MISCELLANEOUS) ×21 IMPLANT
ROD 5.5X40MM (Rod) ×2 IMPLANT
SCREW LOCK (Screw) ×12 IMPLANT
SCREW LOCK FXNS SPNE MAS PL (Screw) IMPLANT
SCREW SHANK 5.5X40MM (Screw) ×2 IMPLANT
SCREW TULIP 5.5 (Screw) ×2 IMPLANT
SPONGE SURGIFOAM ABS GEL 100 (HEMOSTASIS) ×4 IMPLANT
SPONGE T-LAP 4X18 ~~LOC~~+RFID (SPONGE) IMPLANT
STRIP CLOSURE SKIN 1/2X4 (GAUZE/BANDAGES/DRESSINGS) ×12 IMPLANT
SUT SILK 3 0 SH 30 (SUTURE) IMPLANT
SUT VIC AB 0 CT1 18XCR BRD8 (SUTURE) ×6 IMPLANT
SUT VIC AB 0 CT1 8-18 (SUTURE) ×3
SUT VIC AB 2-0 CP2 18 (SUTURE) ×8 IMPLANT
SUT VIC AB 3-0 SH 8-18 (SUTURE) ×12 IMPLANT
TOWEL GREEN STERILE (TOWEL DISPOSABLE) ×7 IMPLANT
TOWEL GREEN STERILE FF (TOWEL DISPOSABLE) ×7 IMPLANT
TRAY FOLEY W/BAG SLVR 14FR (SET/KITS/TRAYS/PACK) ×1 IMPLANT
WATER STERILE IRR 1000ML POUR (IV SOLUTION) ×7 IMPLANT
WRENCH HEX 7.6 (SPINAL CORD STIMULATOR) ×1 IMPLANT

## 2021-07-31 DIAGNOSIS — M48061 Spinal stenosis, lumbar region without neurogenic claudication: Secondary | ICD-10-CM | POA: Diagnosis not present

## 2021-07-31 MED ORDER — OXYCODONE HCL 5 MG PO TABS: 5.0000 mg | ORAL_TABLET | ORAL | 0 refills | Status: DC | PRN

## 2021-07-31 MED ORDER — CELECOXIB 200 MG PO CAPS: 200.0000 mg | ORAL_CAPSULE | Freq: Two times a day (BID) | ORAL | 2 refills | Status: DC

## 2021-07-31 NOTE — TOC Transition Note (Signed)
Transition of Care [facility]) - CM/SW Discharge Note   Patient Details  Name: Katrina Rivera MRN: [id number] Date of Birth: [birth year]  Transition of Care [facility] CM/SW Contact:  Lawerance Sabal, RN Phone Number: 07/31/2021, 9:15 AM   Clinical Narrative:    Patient states that she has been notified by [facility] that she is assigned to them for [facility] services. Verified with Amy, hospital liaison. She states that she has orders from the office.  Unit staff to supply any DME needed.  No other TOC needs identified.     Final next level of care: Home w Home Health Services Barriers to Discharge: No Barriers Identified   Patient Goals and CMS Choice Patient states their goals for this hospitalization and ongoing recovery are:: to go home   Choice offered to / list presented to : NA  Discharge Placement                       Discharge Plan and Services                            [facility] Agency: [facility]     Representative spoke with at [facility] Agency: Amy, [facility] set up by office prior to admission  Social Determinants of Health (SDOH) Interventions     Readmission Risk Interventions     No data to display

## 2021-08-03 DIAGNOSIS — M545 Low back pain, unspecified: Secondary | ICD-10-CM | POA: Diagnosis not present

## 2021-08-03 DIAGNOSIS — Z981 Arthrodesis status: Secondary | ICD-10-CM | POA: Diagnosis not present

## 2021-08-03 DIAGNOSIS — D649 Anemia, unspecified: Secondary | ICD-10-CM | POA: Diagnosis not present

## 2021-08-03 DIAGNOSIS — F419 Anxiety disorder, unspecified: Secondary | ICD-10-CM | POA: Diagnosis not present

## 2021-08-03 DIAGNOSIS — G35 Multiple sclerosis: Secondary | ICD-10-CM | POA: Diagnosis not present

## 2021-08-03 DIAGNOSIS — Z48811 Encounter for surgical aftercare following surgery on the nervous system: Secondary | ICD-10-CM | POA: Diagnosis not present

## 2021-08-03 DIAGNOSIS — I959 Hypotension, unspecified: Secondary | ICD-10-CM | POA: Diagnosis not present

## 2021-08-03 DIAGNOSIS — G629 Polyneuropathy, unspecified: Secondary | ICD-10-CM | POA: Diagnosis not present

## 2021-08-03 DIAGNOSIS — M1380 Other specified arthritis, unspecified site: Secondary | ICD-10-CM | POA: Diagnosis not present

## 2021-08-03 DIAGNOSIS — M797 Fibromyalgia: Secondary | ICD-10-CM | POA: Diagnosis not present

## 2021-08-04 ENCOUNTER — Encounter (HOSPITAL_COMMUNITY): Payer: Self-pay | Admitting: Neurological Surgery

## 2021-08-05 ENCOUNTER — Telehealth: Payer: Self-pay

## 2021-08-05 DIAGNOSIS — E2749 Other adrenocortical insufficiency: Secondary | ICD-10-CM

## 2021-08-05 MED ORDER — FLUDROCORTISONE ACETATE 0.1 MG PO TABS: ORAL_TABLET | ORAL | 0 refills | Status: DC

## 2021-08-05 NOTE — Telephone Encounter (Signed)
Patient called in about blood pressure. States she had back surgery last week. Her sitting B/P is staying around 108/50 and standing is 92/50. Her physical therapy advised her to give you a call and let you know. She does have a UTI and was prescribed sulfamethoxazole. Please advise

## 2021-08-09 DIAGNOSIS — M797 Fibromyalgia: Secondary | ICD-10-CM | POA: Diagnosis not present

## 2021-08-09 DIAGNOSIS — M545 Low back pain, unspecified: Secondary | ICD-10-CM | POA: Diagnosis not present

## 2021-08-09 DIAGNOSIS — I959 Hypotension, unspecified: Secondary | ICD-10-CM | POA: Diagnosis not present

## 2021-08-09 DIAGNOSIS — G35 Multiple sclerosis: Secondary | ICD-10-CM | POA: Diagnosis not present

## 2021-08-09 DIAGNOSIS — Z981 Arthrodesis status: Secondary | ICD-10-CM | POA: Diagnosis not present

## 2021-08-09 DIAGNOSIS — M1380 Other specified arthritis, unspecified site: Secondary | ICD-10-CM | POA: Diagnosis not present

## 2021-08-09 DIAGNOSIS — G629 Polyneuropathy, unspecified: Secondary | ICD-10-CM | POA: Diagnosis not present

## 2021-08-09 DIAGNOSIS — D649 Anemia, unspecified: Secondary | ICD-10-CM | POA: Diagnosis not present

## 2021-08-09 DIAGNOSIS — F419 Anxiety disorder, unspecified: Secondary | ICD-10-CM | POA: Diagnosis not present

## 2021-08-09 DIAGNOSIS — Z48811 Encounter for surgical aftercare following surgery on the nervous system: Secondary | ICD-10-CM | POA: Diagnosis not present

## 2021-08-26 DIAGNOSIS — M5416 Radiculopathy, lumbar region: Secondary | ICD-10-CM | POA: Diagnosis not present

## 2021-08-26 DIAGNOSIS — M25552 Pain in left hip: Secondary | ICD-10-CM | POA: Diagnosis not present

## 2021-09-08 DIAGNOSIS — M25552 Pain in left hip: Secondary | ICD-10-CM | POA: Diagnosis not present

## 2021-10-08 DIAGNOSIS — M25552 Pain in left hip: Secondary | ICD-10-CM | POA: Diagnosis not present

## 2021-10-13 DIAGNOSIS — F419 Anxiety disorder, unspecified: Secondary | ICD-10-CM | POA: Diagnosis not present

## 2021-10-13 DIAGNOSIS — G8929 Other chronic pain: Secondary | ICD-10-CM | POA: Diagnosis not present

## 2021-10-13 DIAGNOSIS — M797 Fibromyalgia: Secondary | ICD-10-CM | POA: Diagnosis not present

## 2021-10-19 DIAGNOSIS — M5416 Radiculopathy, lumbar region: Secondary | ICD-10-CM | POA: Diagnosis not present

## 2021-10-20 ENCOUNTER — Other Ambulatory Visit (HOSPITAL_COMMUNITY): Payer: Self-pay | Admitting: Neurological Surgery

## 2021-10-20 ENCOUNTER — Other Ambulatory Visit: Payer: Self-pay | Admitting: Neurological Surgery

## 2021-10-20 DIAGNOSIS — M5416 Radiculopathy, lumbar region: Secondary | ICD-10-CM

## 2021-10-27 ENCOUNTER — Ambulatory Visit (HOSPITAL_COMMUNITY)
Admission: RE | Admit: 2021-10-27 | Discharge: 2021-10-27 | Disposition: A | Discharge status: Home / Self Care | Payer: PPO | Source: Ambulatory Visit | Attending: Neurological Surgery | Admitting: Neurological Surgery

## 2021-10-27 DIAGNOSIS — M5416 Radiculopathy, lumbar region: Secondary | ICD-10-CM | POA: Insufficient documentation

## 2021-10-27 DIAGNOSIS — M48061 Spinal stenosis, lumbar region without neurogenic claudication: Secondary | ICD-10-CM | POA: Diagnosis not present

## 2021-10-27 MED ORDER — GADOPICLENOL 0.5 MMOL/ML IV SOLN
5.0000 mL | Freq: Once | INTRAVENOUS | Status: AC | PRN
  Administered 2021-10-27: 5 mL via INTRAVENOUS

## 2021-11-08 DIAGNOSIS — M25552 Pain in left hip: Secondary | ICD-10-CM | POA: Diagnosis not present

## 2021-11-09 DIAGNOSIS — M5416 Radiculopathy, lumbar region: Secondary | ICD-10-CM | POA: Diagnosis not present

## 2021-11-18 DIAGNOSIS — M5416 Radiculopathy, lumbar region: Secondary | ICD-10-CM | POA: Diagnosis not present

## 2021-11-23 DIAGNOSIS — E559 Vitamin D deficiency, unspecified: Secondary | ICD-10-CM | POA: Diagnosis not present

## 2021-11-23 DIAGNOSIS — R5383 Other fatigue: Secondary | ICD-10-CM | POA: Diagnosis not present

## 2021-11-23 DIAGNOSIS — M810 Age-related osteoporosis without current pathological fracture: Secondary | ICD-10-CM | POA: Diagnosis not present

## 2021-11-29 ENCOUNTER — Telehealth: Payer: Self-pay | Admitting: Neurology

## 2021-11-29 DIAGNOSIS — R3914 Feeling of incomplete bladder emptying: Secondary | ICD-10-CM | POA: Diagnosis not present

## 2021-11-29 DIAGNOSIS — N319 Neuromuscular dysfunction of bladder, unspecified: Secondary | ICD-10-CM | POA: Diagnosis not present

## 2021-11-29 DIAGNOSIS — N139 Obstructive and reflux uropathy, unspecified: Secondary | ICD-10-CM | POA: Diagnosis not present

## 2021-11-29 MED ORDER — GABAPENTIN 300 MG PO CAPS: 600.0000 mg | ORAL_CAPSULE | Freq: Every day | ORAL | 1 refills | Status: DC

## 2021-11-29 NOTE — Telephone Encounter (Signed)
Pt request refill fogabapentin (NEURONTIN) 300 MG capsuler at [facility]

## 2021-11-29 NOTE — Telephone Encounter (Signed)
Rx sent 

## 2021-11-30 DIAGNOSIS — M810 Age-related osteoporosis without current pathological fracture: Secondary | ICD-10-CM | POA: Diagnosis not present

## 2021-11-30 DIAGNOSIS — E213 Hyperparathyroidism, unspecified: Secondary | ICD-10-CM | POA: Diagnosis not present

## 2021-12-03 ENCOUNTER — Other Ambulatory Visit: Payer: Self-pay

## 2021-12-06 DIAGNOSIS — K130 Diseases of lips: Secondary | ICD-10-CM | POA: Diagnosis not present

## 2021-12-08 ENCOUNTER — Telehealth: Payer: Self-pay | Admitting: Neurology

## 2021-12-08 DIAGNOSIS — S60221A Contusion of right hand, initial encounter: Secondary | ICD-10-CM | POA: Diagnosis not present

## 2021-12-08 NOTE — Telephone Encounter (Signed)
Pt is calling. Stated she is having bad headaches and would like to talk to a nurse about this. Pt is requesting a call-back from nurse.

## 2021-12-09 DIAGNOSIS — M5416 Radiculopathy, lumbar region: Secondary | ICD-10-CM | POA: Diagnosis not present

## 2021-12-09 NOTE — Telephone Encounter (Signed)
I called the pt back and we discussed. She would like a sooner f/u to discuss worsening h/a. I have scheduled for 12/22/21 with SS NP at 315.

## 2021-12-13 ENCOUNTER — Other Ambulatory Visit: Payer: Self-pay | Admitting: Neurology

## 2021-12-14 DIAGNOSIS — R0789 Other chest pain: Secondary | ICD-10-CM | POA: Diagnosis not present

## 2021-12-14 DIAGNOSIS — Z23 Encounter for immunization: Secondary | ICD-10-CM | POA: Diagnosis not present

## 2021-12-14 DIAGNOSIS — F419 Anxiety disorder, unspecified: Secondary | ICD-10-CM | POA: Diagnosis not present

## 2021-12-14 DIAGNOSIS — R000 Tachycardia, unspecified: Secondary | ICD-10-CM | POA: Diagnosis not present

## 2021-12-16 DIAGNOSIS — H40013 Open angle with borderline findings, low risk, bilateral: Secondary | ICD-10-CM | POA: Diagnosis not present

## 2021-12-16 DIAGNOSIS — H04123 Dry eye syndrome of bilateral lacrimal glands: Secondary | ICD-10-CM | POA: Diagnosis not present

## 2021-12-16 DIAGNOSIS — Z961 Presence of intraocular lens: Secondary | ICD-10-CM | POA: Diagnosis not present

## 2021-12-16 DIAGNOSIS — H35371 Puckering of macula, right eye: Secondary | ICD-10-CM | POA: Diagnosis not present

## 2021-12-21 NOTE — Progress Notes (Addendum)
Patient: Katrina Rivera Date of Birth: 1945-08-15  Reason for Visit: Follow up History from: Patient, husband Primary Neurologist: Krista Blue  ASSESSMENT AND PLAN 76 y.o. year old female   1.  Relapsing remitting multiple sclerosis 2.  Intermittent diffuse body achy pain 3.  Gait disorder, paresthesia lower extremities 4.  Restless leg syndrome 5.  Chronic low back pain 6.  New onset headache, with migraine features  -Order MRI of the brain with and without contrast, spinal cord stimulator is apparently now MRI compatible at [place] ESR, CRP rule out temporal arteritis; had BMP, TSH earlier today, I will follow these results -Headache onset occurred around the time of new onset heart palpitations, along with elevated calcium, she has appointments next week with cardiology and endocrinology -She will follow-up with me next with a progress report, will arrange for follow up with Dr. Krista Blue since new issue  Addendum 03/29/22 SS: Will try to get Ajovy approved.   HISTORY  Katrina Rivera is a very pleasant 76 year-old right-handed woman, with relapsing remitting multiple sclerosis, was treated with Betaseron 4 -5 years, but could not tolerate the side effect, now is not on any immunomodulation therapy. neuropathic pain of bilateral lower extremities.   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    She has baseline gait difficulty, she fell in May 2013, fractured both elbows and her left knee cap.  She  intermittently has been using a cane or walker.  She could not tolerate Requip because of nausea. She has been on gabapentin 300 mg 3 bid. She gained significant weight gain when on Lyrica, and therefore was switched back to gabapentin. She has orthostatic hypotension, and is on midodrine 2.5 mg and fludrocortisone 0.1 mg 1 in AM and 2 at night, and she is also on 20 mg of hydrocortisone.   She complains of bilateral lower extremity burning stinging sensation, getting worse when she sits still, difficulty falling into sleep, she has the urge to move because of bilateral extremity discomfort, has tried Requip, could not tolerate it because of GI side effects, Neuprol patch does not work either, she also tried Elavil, Cymbalta in the past, could not tolerate it due to side effect.   UPDATE Nov 17 2020: She is accompanied by her husband at today's visit, just had right knee replacement in July 2022, finishing up physical therapy, continue has gait abnormality, wearing ankle brace now, seems to help her some  She continue to take Horizant 600 mg at bedtime, gabapentin 100 mg 1 to 2 tablets every night as needed  She complains of intermittent diffuse body achy sensation,  Update December 22, 2021 SS: June 2023 underwent lumbar fusion with replacement of spinal cord stimulator battery, claims is now MRI compatible. 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   Here today for new problem, 1 month ago started having headaches, frontal, above eyes, left temporal, pounding. Is present in the morning, stays during the day.   Sees endocrinology, her calcium has been high. No vision changes, has seen eye  doctor. With headaches wants to lay down, gets nauseated. Takes Tylenol without benefit.   Now on propranolol 10 mg TID for palpations, this symptom started around the same time as headache, seeing cardiology next week. PCP did EKG, saw something started with "T"  Apparently PTH  issues, had labs done today, calcium has been elevated around time of headache starting, related?  REVIEW OF SYSTEMS: Out of a complete 14 system review of symptoms, the patient complains only of the following symptoms, and all other reviewed systems are negative.  See HPI  ALLERGIES: Allergies  Allergen Reactions   Amoxicillin Other (See Comments)    Upset stomach   Aspirin     bruising   Azithromycin     stomach upset   Doxycycline Hyclate     GI upset   Tizanidine Other (See Comments)    Reported feeling jittery on this medication.   Buspirone Hcl Palpitations   Mirtazapine Palpitations    Weight gain    HOME MEDICATIONS: Outpatient Medications Prior to Visit  Medication Sig Dispense Refill   acetaminophen (TYLENOL) 500 MG tablet Take 1,000 mg by mouth daily as needed for moderate pain.     acetaminophen (TYLENOL) 650 MG CR tablet Take 1,300 mg by mouth 2 (two) times daily.     alfuzosin (UROXATRAL) 10 MG 24 hr tablet Take 10 mg by mouth daily with breakfast.     Ascorbic Acid (VITAMIN C PO) Take 1 tablet by mouth daily.     B Complex Vitamins (VITAMIN B COMPLEX PO) Take 1 capsule by mouth in the morning.     celecoxib (CELEBREX) 200 MG capsule Take 1 capsule (200 mg total) by mouth every 12 (twelve) hours. 30 capsule 2   cephALEXin (KEFLEX) 250 MG capsule Take 250 mg by mouth every other day.     cholecalciferol (VITAMIN D3) 25 MCG (1000 UNIT) tablet Take 1,000 Units by mouth daily.     CRANBERRY PO Take 1 tablet by mouth 2 (two) times daily.     dicyclomine (BENTYL) 10 MG capsule Take 10 mg by mouth in the morning, at noon, and at bedtime.     DULoxetine (CYMBALTA) 60 MG capsule Take 60 mg by mouth daily.     ferrous sulfate 325 (65 FE) MG tablet Take 325 mg by mouth daily.     fludrocortisone (FLORINEF) 0.1 MG tablet Take 1 tablet daily 90 tablet 0   fluticasone (CUTIVATE) 0.05 % cream Apply 1 application  topically 2 (two) times daily as needed for irritation.      fluticasone (FLONASE) 50 MCG/ACT nasal spray Place 1 spray into both nostrils daily as needed for allergies or rhinitis.     gabapentin (NEURONTIN) 300 MG capsule Take 2-3 capsules (600-900 mg total) by mouth at bedtime. 270 capsule 1   HORIZANT 600 MG TBCR TAKE ONE TABLET BY MOUTH AT BEDTIME 90 tablet 4   methylPREDNISolone (MEDROL) 4 MG tablet TAKE 1 TABLET (4MG) AT BREAKFAST AND 1/2 TABLET (2MG) AT 4PM. 135 tablet 2   Multiple Vitamin (MULTIVITAMIN) tablet Take 1 tablet by mouth in the morning.     OVER THE COUNTER MEDICATION Take 1 capsule by mouth at bedtime. Sleep 3     Polyethyl Glycol-Propyl Glycol (SYSTANE OP) Place 2 drops into both eyes daily as needed (for dry eyes).     polyethylene glycol (MIRALAX) 17 g packet Take 17 g by mouth 2 (two) times daily. 17 grams in 6 oz of favorite drink twice a day  until bowel movement.  LAXITIVE.  Restart if two days since last bowel movement (Patient taking differently: Take 17 g by mouth daily as needed for moderate constipation.) 14 packet 0   Probiotic Product (PROBIOTIC PO) Take 1 capsule by mouth daily.     promethazine (PHENERGAN) 25 MG tablet Take 25 mg by mouth every 6 (six) hours as needed for nausea or vomiting.     propranolol (INDERAL) 10 MG tablet Take 10 mg by mouth 3 (three) times daily as needed.     tiZANidine (ZANAFLEX) 2 MG tablet Take 4 mg by mouth at bedtime.     levofloxacin (LEVAQUIN) 500 MG tablet Take 500 mg by mouth daily.     oxyCODONE (OXY IR/ROXICODONE) 5 MG immediate release tablet Take 1 tablet (5 mg total) by mouth every 4 (four) hours as needed (pain). 30 tablet 0   No facility-administered medications prior to visit.    PAST MEDICAL HISTORY: Past Medical History:  Diagnosis Date   Addison's disease ([place])    takes Solu Cortef daily   Anemia    takes Ferrous Sulfate daily   Anxiety    takes Xanax nightly   Arthritis    Chronic back pain    stenosis   Depression    takes Cymbalta daily   Dizziness    if b/p  drops    Fibromyalgia    Fibromyalgia    History of blood transfusion    no abnormal reaction noted   History of bronchitis    many yrs ago    Hypokalemia    takes Potassium daily   Hypotension    Hypotension    takes Florinef daily   IBS (irritable bowel syndrome)    takes Align daily   Insomnia    takes Trazodone nightly   Joint pain    Multiple sclerosis ([place])    doesn't take any meds   Multiple sclerosis ([place])    Nocturia    Osteoporosis    Peripheral neuropathy    Primary localized osteoarthritis of left knee 08/11/2020   Restless leg syndrome    Seasonal allergies    takes Zyrtec daily;uses Flonase daily as needed   Syncope    Urinary frequency    takes Flomax daily   Weakness    numbness and tingling    PAST SURGICAL HISTORY: Past Surgical History:  Procedure Laterality Date   ABDOMINAL HYSTERECTOMY  02/07/1986   APPENDECTOMY  02/07/1986   CESAREAN SECTION  1973/1977   x2   CHOLECYSTECTOMY  02/08/1995   COLECTOMY  02/08/1988   COLONOSCOPY     ESOPHAGOGASTRODUODENOSCOPY     EYE SURGERY     bilateral - /w IOL   FRACTURE SURGERY Right    rods and screws-right leg   LUMBAR LAMINECTOMY/DECOMPRESSION MICRODISCECTOMY Left 01/24/2013   Procedure: LUMBAR FIVE TO SACRAL ONE LUMBAR LAMINECTOMY/DECOMPRESSION MICRODISCECTOMY 1 LEVEL;  Surgeon: Eustace Moore, MD;  Location: [facility];  Service: Neurosurgery;  Laterality: Left;   MAXIMUM ACCESS (MAS)POSTERIOR LUMBAR INTERBODY FUSION (PLIF) 1 LEVEL N/A 06/18/2014   Procedure: MAXIMUM ACCESS SURGERY POSTERIOR LUMBAR INTERBODY FUSION LUMBAR FIVE TO SACRAL ONE ;  Surgeon: Eustace Moore, MD;  Location: [facility];  Service: Neurosurgery;  Laterality: N/A;   SPINAL CORD STIMULATOR BATTERY EXCHANGE Right 07/30/2021   Procedure: Spinal cord stimulator battery replacement;  Surgeon: Eustace Moore, MD;  Location: [facility];  Service: Neurosurgery;  Laterality: Right;   SPINAL CORD STIMULATOR IMPLANT     TOTAL  KNEE ARTHROPLASTY  Left 08/24/2020   Procedure: TOTAL KNEE ARTHROPLASTY;  Surgeon: Elsie Saas, MD;  Location: [facility];  Service: Orthopedics;  Laterality: Left;    FAMILY HISTORY: Family History  Problem Relation Age of Onset   Hypertension Mother    Stroke Mother    Heart attack Father    Tremor Brother     SOCIAL HISTORY: Social History   Socioeconomic History   Marital status: Married    Spouse name: [name]   Number of children: 2   Years of education: 12   Highest education level: Not on file  Occupational History    Employer: DISABLED    Comment: Disabled  Tobacco Use   Smoking status: Never   Smokeless tobacco: Never  Vaping Use   Vaping Use: Never used  Substance and Sexual Activity   Alcohol use: Never   Drug use: Never   Sexual activity: Not on file  Other Topics Concern   Not on file  Social History Narrative   Pt lives at home with spouse. Wille Glaser) [phone] (patient's cell)   [name]'s cell  [phone]      Caffeine Use: 1 cups daily.Right handed.Disabled.Education - high schoolPatient has two adult children.   Social Determinants of Health   Financial Resource Strain: Not on file  Food Insecurity: Not on file  Transportation Needs: Not on file  Physical Activity: Not on file  Stress: Not on file  Social Connections: Not on file  Intimate Partner Violence: Not on file    PHYSICAL EXAM  Vitals:   12/22/21 1522  BP: (!) 160/72  Pulse: 78   There is no height or weight on file to calculate BMI.  Generalized: Well developed, in no acute distress, she looks good today, she is more mobile, energetic than in the past  Neurological examination  Mentation: Alert oriented to time, place, history taking. Follows all commands speech and language fluent Cranial nerve II-XII: Pupils were equal round reactive to light. Extraocular movements were full, visual field were full on confrontational test. Facial sensation and strength were normal. Head turning and shoulder shrug   were normal and symmetric. Motor: The motor testing reveals 5 over 5 strength of all 4 extremities. Good symmetric motor tone is noted throughout.  Mild bilateral ankle dorsiflexion weakness Sensory: Sensory testing is intact to soft touch on all 4 extremities. No evidence of extinction is noted.  Coordination: Cerebellar testing reveals good finger-nose-finger and heel-to-shin bilaterally.  Gait and station: Gait is slightly wide-based, cautious, limp on the left, no assistive device Reflexes: Deep tendon reflexes are symmetric and normal  DIAGNOSTIC DATA (LABS, IMAGING, TESTING) - I reviewed patient records, labs, notes, testing and imaging myself where available.  Lab Results  Component Value Date   WBC 9.2 07/26/2021   HGB 11.7 (L) 07/26/2021   HCT 37.4 07/26/2021   MCV 100.8 (H) 07/26/2021   PLT 192 07/26/2021      Component Value Date/Time   NA 139 07/26/2021 0931   NA 140 02/06/2017 1126   K 3.6 07/26/2021 0931   CL 104 07/26/2021 0931   CO2 27 07/26/2021 0931   GLUCOSE 89 07/26/2021 0931   BUN 18 07/26/2021 0931   BUN 22 02/06/2017 1126   CREATININE 0.83 07/26/2021 0931   CREATININE 1.00 09/18/2019 0814   CREATININE 0.85 11/06/2013 0840   CALCIUM 9.9 07/26/2021 0931   PROT 7.3 08/13/2020 1321   ALBUMIN 4.5 08/13/2020 1321   AST 31 08/13/2020 1321   AST 23  09/18/2019 0814   ALT 23 08/13/2020 1321   ALT 15 09/18/2019 0814   ALKPHOS 30 (L) 08/13/2020 1321   BILITOT 0.9 08/13/2020 1321   BILITOT 0.6 09/18/2019 0814   GFRNONAA >60 07/26/2021 0931   GFRNONAA 57 (L) 09/18/2019 0814   GFRAA >60 09/18/2019 0814   No results found for: "CHOL", "HDL", "LDLCALC", "LDLDIRECT", "TRIG", "CHOLHDL" No results found for: "HGBA1C" Lab Results  Component Value Date   VITAMINB12 1,190 (H) 03/10/2016   Lab Results  Component Value Date   TSH 2.21 08/21/2018    Butler Denmark, AGNP-C, DNP 12/22/2021, 3:57 PM [facility] [address] [place],  [place] [address] [phone]

## 2021-12-22 ENCOUNTER — Telehealth: Payer: Self-pay | Admitting: Neurology

## 2021-12-22 ENCOUNTER — Ambulatory Visit: Payer: PPO | Admitting: Neurology

## 2021-12-22 ENCOUNTER — Encounter: Payer: Self-pay | Admitting: Neurology

## 2021-12-22 ENCOUNTER — Other Ambulatory Visit (INDEPENDENT_AMBULATORY_CARE_PROVIDER_SITE_OTHER): Payer: PPO

## 2021-12-22 VITALS — BP 160/72 | HR 78

## 2021-12-22 DIAGNOSIS — E2749 Other adrenocortical insufficiency: Secondary | ICD-10-CM | POA: Diagnosis not present

## 2021-12-22 DIAGNOSIS — R519 Headache, unspecified: Secondary | ICD-10-CM

## 2021-12-22 DIAGNOSIS — G43709 Chronic migraine without aura, not intractable, without status migrainosus: Secondary | ICD-10-CM

## 2021-12-22 DIAGNOSIS — R5382 Chronic fatigue, unspecified: Secondary | ICD-10-CM | POA: Diagnosis not present

## 2021-12-22 DIAGNOSIS — Z981 Arthrodesis status: Secondary | ICD-10-CM | POA: Diagnosis not present

## 2021-12-22 DIAGNOSIS — G35 Multiple sclerosis: Secondary | ICD-10-CM | POA: Diagnosis not present

## 2021-12-22 HISTORY — DX: Headache, unspecified: R51.9

## 2021-12-22 LAB — BASIC METABOLIC PANEL
BUN: 20 mg/dL (ref 6–23)
CO2: 32 mEq/L (ref 19–32)
Calcium: 10.1 mg/dL (ref 8.4–10.5)
Chloride: 102 mEq/L (ref 96–112)
Creatinine, Ser: 0.92 mg/dL (ref 0.40–1.20)
GFR: 61.45 mL/min (ref >60.00)
Glucose, Bld: 106 mg/dL — ABNORMAL HIGH (ref 70–99)
Potassium: 3.9 mEq/L (ref 3.5–5.1)
Sodium: 138 mEq/L (ref 135–145)

## 2021-12-22 LAB — T4, FREE: Free T4: 0.7 ng/dL (ref 0.60–1.60)

## 2021-12-22 LAB — TSH: TSH: 1.19 u[IU]/mL (ref 0.35–5.50)

## 2021-12-22 NOTE — Patient Instructions (Signed)
I will order MRI of the brain  Check labs today  Send me a my chart message next week after your cardiology visit

## 2021-12-22 NOTE — Telephone Encounter (Signed)
[employer] NPR sent to [facility] [phone]

## 2021-12-23 LAB — SEDIMENTATION RATE: Sed Rate: 6 mm/hr (ref 0–40)

## 2021-12-23 LAB — C-REACTIVE PROTEIN: CRP: <1 mg/L (ref 0–10)

## 2021-12-27 ENCOUNTER — Ambulatory Visit: Payer: PPO | Admitting: Internal Medicine

## 2021-12-27 ENCOUNTER — Encounter: Payer: Self-pay | Admitting: Internal Medicine

## 2021-12-27 ENCOUNTER — Other Ambulatory Visit: Payer: PPO

## 2021-12-27 VITALS — BP 174/90 | HR 73 | Ht 60.0 in | Wt 114.8 lb

## 2021-12-27 DIAGNOSIS — E782 Mixed hyperlipidemia: Secondary | ICD-10-CM | POA: Diagnosis not present

## 2021-12-27 DIAGNOSIS — I10 Essential (primary) hypertension: Secondary | ICD-10-CM

## 2021-12-27 DIAGNOSIS — R079 Chest pain, unspecified: Secondary | ICD-10-CM | POA: Diagnosis not present

## 2021-12-27 MED ORDER — METOPROLOL SUCCINATE ER 50 MG PO TB24: 50.0000 mg | ORAL_TABLET | Freq: Every day | ORAL | 2 refills | Status: DC

## 2021-12-27 MED ORDER — LOSARTAN POTASSIUM 100 MG PO TABS: 100.0000 mg | ORAL_TABLET | Freq: Every day | ORAL | 3 refills | Status: DC

## 2021-12-27 NOTE — Progress Notes (Signed)
Primary Physician/Referring:  [name], MD  Patient ID: Katrina Rivera, female    DOB: [birth year], 76 y.o.   MRN: [id number]  Chief Complaint  Patient presents with  . Chest Pain  . Tachycardia   HPI:    Katrina Rivera  is a 76 y.o. ***  Past Medical History:  Diagnosis Date  . Addison's disease (HCC)    takes Solu Cortef daily  . Anemia    takes Ferrous Sulfate daily  . Anxiety    takes Xanax nightly  . Arthritis   . Chronic back pain    stenosis  . Depression    takes Cymbalta daily  . Dizziness    if b/p drops   . Fibromyalgia   . Fibromyalgia   . History of blood transfusion    no abnormal reaction noted  . History of bronchitis    many yrs ago   . Hypokalemia    takes Potassium daily  . Hypotension   . Hypotension    takes Florinef daily  . IBS (irritable bowel syndrome)    takes Librarian, academic daily  . Insomnia    takes Trazodone nightly  . Joint pain   . Multiple sclerosis (HCC)    doesn't take any meds  . Multiple sclerosis (HCC)   . New onset headache 12/22/2021  . Nocturia   . Osteoporosis   . Peripheral neuropathy   . Primary localized osteoarthritis of left knee 08/11/2020  . Restless leg syndrome   . Seasonal allergies    takes Zyrtec daily;uses Flonase daily as needed  . Syncope   . Urinary frequency    takes Flomax daily  . Weakness    numbness and tingling   Past Surgical History:  Procedure Laterality Date  . ABDOMINAL HYSTERECTOMY  02/07/1986  . APPENDECTOMY  02/07/1986  . CESAREAN SECTION  1973/1977   x2  . CHOLECYSTECTOMY  02/08/1995  . COLECTOMY  02/08/1988  . COLONOSCOPY    . ESOPHAGOGASTRODUODENOSCOPY    . EYE SURGERY     bilateral - /w IOL  . FRACTURE SURGERY Right    rods and screws-right leg  . LUMBAR LAMINECTOMY/DECOMPRESSION MICRODISCECTOMY Left 01/24/2013   Procedure: LUMBAR FIVE TO SACRAL ONE LUMBAR LAMINECTOMY/DECOMPRESSION MICRODISCECTOMY 1 LEVEL;  Surgeon: Tia Alert, MD;  Location: [facility];   Service: Neurosurgery;  Laterality: Left;  Marland Kitchen MAXIMUM ACCESS (MAS)POSTERIOR LUMBAR INTERBODY FUSION (PLIF) 1 LEVEL N/A 06/18/2014   Procedure: MAXIMUM ACCESS SURGERY POSTERIOR LUMBAR INTERBODY FUSION LUMBAR FIVE TO SACRAL ONE ;  Surgeon: Tia Alert, MD;  Location: [facility];  Service: Neurosurgery;  Laterality: N/A;  . SPINAL CORD STIMULATOR BATTERY EXCHANGE Right 07/30/2021   Procedure: Spinal cord stimulator battery replacement;  Surgeon: Tia Alert, MD;  Location: [facility];  Service: Neurosurgery;  Laterality: Right;  . SPINAL CORD STIMULATOR IMPLANT    . TOTAL KNEE ARTHROPLASTY Left 08/24/2020   Procedure: TOTAL KNEE ARTHROPLASTY;  Surgeon: Salvatore Marvel, MD;  Location: [facility];  Service: Orthopedics;  Laterality: Left;   Family History  Problem Relation Age of Onset  . Hypertension Mother   . Stroke Mother   . Heart attack Father   . Tremor Brother     Social History   Tobacco Use  . Smoking status: Never  . Smokeless tobacco: Never  Substance Use Topics  . Alcohol use: Never   Marital Status: Married  ROS  ***ROS Objective  Blood pressure (!) 174/90, pulse 73, height 5' (1.524 m),  weight 114 lb 12.8 oz (52.1 kg), SpO2 99 %. Body mass index is 22.42 kg/m.     12/27/2021    1:19 PM 12/27/2021    1:14 PM 12/22/2021    3:22 PM  Vitals with BMI  Height  5\' 0"    Weight  114 lbs 13 oz   BMI  22.42   Systolic 174 192  Diastolic 90 89 72  Pulse 73 72 78     ***Physical Exam  Medications and allergies   Allergies  Allergen Reactions  . Amoxicillin Other (See Comments)    Upset stomach  . Aspirin     bruising  . Azithromycin     stomach upset  . Doxycycline Hyclate     GI upset  . Tizanidine Other (See Comments)    Reported feeling jittery on this medication.  . Buspirone Hcl Palpitations  . Mirtazapine Palpitations    Weight gain     Medication list after today's encounter   Current Outpatient Medications:  .  acetaminophen (TYLENOL) 500 MG  tablet, Take 1,000 mg by mouth daily as needed for moderate pain., Disp: , Rfl:  .  alfuzosin (UROXATRAL) 10 MG 24 hr tablet, Take 10 mg by mouth daily with breakfast., Disp: , Rfl:  .  Ascorbic Acid (VITAMIN C PO), Take 1 tablet by mouth daily., Disp: , Rfl:  .  B Complex Vitamins (VITAMIN B COMPLEX PO), Take 1 capsule by mouth in the morning., Disp: , Rfl:  .  cephALEXin (KEFLEX) 250 MG capsule, Take 250 mg by mouth every other day., Disp: , Rfl:  .  cholecalciferol (VITAMIN D3) 25 MCG (1000 UNIT) tablet, Take 1,000 Units by mouth daily., Disp: , Rfl:  .  CRANBERRY PO, Take 1 tablet by mouth 2 (two) times daily., Disp: , Rfl:  .  dicyclomine (BENTYL) 10 MG capsule, Take 10 mg by mouth in the morning, at noon, and at bedtime., Disp: , Rfl:  .  DULoxetine (CYMBALTA) 60 MG capsule, Take 60 mg by mouth daily., Disp: , Rfl:  .  ferrous sulfate 325 (65 FE) MG tablet, Take 325 mg by mouth daily., Disp: , Rfl:  .  fludrocortisone (FLORINEF) 0.1 MG tablet, Take 1 tablet daily, Disp: 90 tablet, Rfl: 0 .  fluticasone (CUTIVATE) 0.05 % cream, Apply 1 application  topically 2 (two) times daily as needed for irritation., Disp: , Rfl:  .  fluticasone (FLONASE) 50 MCG/ACT nasal spray, Place 1 spray into both nostrils daily as needed for allergies or rhinitis., Disp: , Rfl:  .  gabapentin (NEURONTIN) 300 MG capsule, Take 2-3 capsules (600-900 mg total) by mouth at bedtime., Disp: 270 capsule, Rfl: 1 .  HORIZANT 600 MG TBCR, TAKE ONE TABLET BY MOUTH AT BEDTIME, Disp: 90 tablet, Rfl: 4 .  methylPREDNISolone (MEDROL) 4 MG tablet, TAKE 1 TABLET (4MG ) AT BREAKFAST AND 1/2 TABLET (2MG ) AT 4PM., Disp: 135 tablet, Rfl: 2 .  Multiple Vitamin (MULTIVITAMIN) tablet, Take 1 tablet by mouth in the morning., Disp: , Rfl:  .  OVER THE COUNTER MEDICATION, Take 1 capsule by mouth at bedtime. Sleep 3, Disp: , Rfl:  .  Polyethyl Glycol-Propyl Glycol (SYSTANE OP), Place 2 drops into both eyes daily as needed (for dry eyes)., Disp: ,  Rfl:  .  Probiotic Product (PROBIOTIC PO), Take 1 capsule by mouth daily., Disp: , Rfl:  .  promethazine (PHENERGAN) 25 MG tablet, Take 25 mg by mouth every 6 (six) hours as needed for nausea or vomiting.,  Disp: , Rfl:  .  propranolol (INDERAL) 10 MG tablet, Take 10 mg by mouth 3 (three) times daily as needed., Disp: , Rfl:  .  tiZANidine (ZANAFLEX) 2 MG tablet, Take 4 mg by mouth at bedtime., Disp: , Rfl:  .  polyethylene glycol (MIRALAX) 17 g packet, Take 17 g by mouth 2 (two) times daily. 17 grams in 6 oz of favorite drink twice a day until bowel movement.  LAXITIVE.  Restart if two days since last bowel movement (Patient taking differently: Take 17 g by mouth daily as needed for moderate constipation.), Disp: 14 packet, Rfl: 0  Laboratory examination:   Lab Results  Component Value Date   NA 138 12/22/2021   K 3.9 12/22/2021   CO2 32 12/22/2021   GLUCOSE 106 (H) 12/22/2021   BUN 20 12/22/2021   CREATININE 0.92 12/22/2021   CALCIUM 10.1 12/22/2021   GFRNONAA >60 07/26/2021       Latest Ref Rng & Units 12/22/2021    2:38 PM 07/26/2021    9:31 AM 06/24/2021   11:20 AM  CMP  Glucose 70 - 99 mg/dL 254  89  270   BUN 6 - 23 mg/dL 20  18  31    Creatinine 0.40 - 1.20 mg/dL  6.23  7.62   Sodium 135 - 145 mEq/L 138  139  138   Potassium 3.5 - 5.1 mEq/L 3.9  3.6  4.3   Chloride 96 - 112 mEq/L 102  104  103   CO2 19 - 32 mEq/L 32  27  28   Calcium 8.4 - 10.5 mg/dL 8.31  9.9  51.7       Latest Ref Rng & Units 07/26/2021    9:31 AM 08/13/2020    1:21 PM 09/18/2019    8:14 AM  CBC  WBC 4.0 - 10.5 K/uL 9.2  9.0  7.8   Hemoglobin 12.0 - 15.0 g/dL 11/18/2019  07.3  71.0   Hematocrit 36.0 - 46.0 % 37.4  40.4  41.5   Platelets 150 - 400 K/uL 192  247  218     Lipid Panel No results for input(s): "CHOL", "TRIG", "LDLCALC", "VLDL", "HDL", "CHOLHDL", "LDLDIRECT" in the last 8760 hours.  HEMOGLOBIN A1C No results found for: "HGBA1C", "MPG" TSH Recent Labs    12/22/21 1438  TSH 1.19     External labs:   ***  Radiology:    Cardiac Studies:   No results found for this or any previous visit from the past 1095 days.     No results found for this or any previous visit from the past 1095 days.   ***  EKG:   ***  ***  Assessment     ICD-10-CM   1. Chest pain of uncertain etiology  R07.9 EKG 12-Lead    2. Essential hypertension  I10     3. Mixed hyperlipidemia  E78.2        Orders Placed This Encounter  Procedures  . EKG 12-Lead    No orders of the defined types were placed in this encounter.   Medications Discontinued During This Encounter  Medication Reason  . acetaminophen (TYLENOL) 650 MG CR tablet Completed Course  . celecoxib (CELEBREX) 200 MG capsule Completed Course     Recommendations:   Katrina Rivera is a 76 y.o.  ***    68, DO, [facility]  12/27/2021, 1:44 PM Office: [phone] Pager: [phone]

## 2021-12-29 ENCOUNTER — Encounter: Payer: Self-pay | Admitting: Neurology

## 2021-12-29 ENCOUNTER — Encounter: Payer: Self-pay | Admitting: Internal Medicine

## 2021-12-29 DIAGNOSIS — R198 Other specified symptoms and signs involving the digestive system and abdomen: Secondary | ICD-10-CM | POA: Diagnosis not present

## 2021-12-29 DIAGNOSIS — R933 Abnormal findings on diagnostic imaging of other parts of digestive tract: Secondary | ICD-10-CM | POA: Diagnosis not present

## 2021-12-29 NOTE — Telephone Encounter (Signed)
Patient informed. 

## 2021-12-29 NOTE — Telephone Encounter (Signed)
From patient.

## 2021-12-29 NOTE — Telephone Encounter (Signed)
Have her stop the metprolol and continue losartan at nighttime

## 2022-01-01 ENCOUNTER — Other Ambulatory Visit: Payer: Self-pay | Admitting: Neurology

## 2022-01-01 ENCOUNTER — Ambulatory Visit (HOSPITAL_COMMUNITY)
Admission: RE | Admit: 2022-01-01 | Discharge: 2022-01-01 | Disposition: A | Discharge status: Home / Self Care | Payer: PPO | Source: Ambulatory Visit | Attending: Neurology | Admitting: Neurology

## 2022-01-01 DIAGNOSIS — G35D Multiple sclerosis, unspecified: Secondary | ICD-10-CM

## 2022-01-01 DIAGNOSIS — R519 Headache, unspecified: Secondary | ICD-10-CM

## 2022-01-01 DIAGNOSIS — G9389 Other specified disorders of brain: Secondary | ICD-10-CM | POA: Diagnosis not present

## 2022-01-01 DIAGNOSIS — G35 Multiple sclerosis: Secondary | ICD-10-CM | POA: Diagnosis not present

## 2022-01-01 DIAGNOSIS — Z981 Arthrodesis status: Secondary | ICD-10-CM

## 2022-01-03 ENCOUNTER — Ambulatory Visit: Payer: PPO | Admitting: Endocrinology

## 2022-01-03 NOTE — Progress Notes (Deleted)
Patient ID: Katrina Rivera, female   DOB: Oct 21, 1945, 76 y.o.   MRN: [id number]   Subjective:      Chief complaint: Endocrinology follow-up  PROBLEM 1: Dysautonomic orthostatic hypotension   PAST history: She has had long-standing problems with orthostatic hypotension and also hyponatremia. Has been diagnosed with dysautonomia and has multiple other problems related to autonomic neuropathy.   She has been on Florinef since 2004 previously taking 3 tablets daily along with 5 tablets of potassium which had previously controlled her symptoms well . Because of persistent orthostatic symptoms she had been tried on midodrine on 05/28/12 but this was later stopped when blood pressure increased.  She  had medication adjustments done frequently over the last year for regulating her blood pressure.  RECENT history:   She has  been on variable doses of Florinef long-term, as much as 3 a day previously,   Currently taking a stable dose of only 1 tablet 3 days a week  She only has rare feelings of lightheadedness and blood pressure may be lower and will take an extra Florinef if needed  She is checking her blood pressure regularly with an [employer] meter Again keeping a track of her blood sugars on an app on her phone  Has checked readings sitting and standing Recent results were reviewed  As before standing blood pressure systolic reading is usually a little lower than sitting readings  Recently most of her blood pressure readings have been in the 120-130 range also rarely below 120 standing recently  Blood pressure was high on first measurement today  Potassium has been consistently normal without any supplements    Lab Results  Component Value Date   CREATININE 0.91 06/24/2021   BUN 31 (H) 06/24/2021   NA 138 06/24/2021   K 4.3 06/24/2021   CL 103 06/24/2021   CO2 28 06/24/2021     Adrenal Insufficiency:   This is secondary to pituitary dysfunction  Prior testing  included Cortrosyn test showing stimulated level of 15.7, baseline 3.9.  Also confirmed by 24 hour urine free cortisol which was only 3.0.  Although previously she had tolerated oral hydrocortisone and small doses with improvement in her symptoms she did not continue this long-term She was again symptomatic with weight loss, decreased appetite, nausea and also diarrhea Cortrosyn stimulation test on 03/12/14 showed baseline cortisol level of 0.3 and post injection of 1.1 only  She had GI side effects from hydrocortisone and prednisone and was in the past using hydrocortisone injections using insulin syringe   RECENT HISTORY:  She feels well on methylprednisolone without any nausea or weight loss She does tend to have fatigue chronically She is taking 4 mg in the early morning and half tablet in the late afternoon very regularly Did not feel as well when she tried to reduce the dose  Also is aware of needing to take stress doses for acute illnesses  Wt Readings from Last 3 Encounters:  06/28/21 116 lb 6.4 oz (52.8 kg)  03/04/21 116 lb (52.6 kg)  03/02/21 116 lb (52.6 kg)     General Endocrinology:   HYPERCALCEMIA:  She has had intermittent hypercalcemia since at least 2018, previously only temporarily associated with renal dysfunction Her PTH level was 31 previously and subsequently 23 and 38 1, 25 vitamin D level is normal  However her calcium is variable, now upper normal  She is not on calcium supplements  She does have osteoporosis as of 2016, taking Prolia from  orthopedic surgeon every 6 months She was told by orthopedic surgeon to take vitamin D   Last bone density in 2020 showed improved BMD at the hip and spine Recent bone density result not available for review  Lab Results  Component Value Date   PTH 38 06/01/2020   PTH Comment 06/01/2020   CALCIUM 10.3 06/24/2021   PHOS 1.9 (L) 07/23/2006     Allergies as of 06/28/2021       Reactions   Aspirin    bruising    Azithromycin    Other reaction(s): stomach upset   Doxycycline Hyclate    Other reaction(s): GI upset   Tizanidine Other (See Comments)   Reported feeling jittery on this medication.   Buspirone Hcl Palpitations   Mirtazapine Palpitations        Medication List        Accurate as of Jun 28, 2021  1:34 PM. If you have any questions, ask your nurse or doctor.          acetaminophen 500 MG tablet Commonly known as: TYLENOL Take 1,000 mg by mouth at bedtime.   CRANBERRY PO Take 2 tablets by mouth in the morning.   dicyclomine 10 MG capsule Commonly known as: BENTYL Take 10 mg by mouth at bedtime.   DULoxetine 30 MG capsule Commonly known as: Cymbalta Take 1 capsule (30 mg total) by mouth daily.   Ferrous Sulfate Dried 200 (65 Fe) MG Tabs Take 65 mg by mouth in the morning.   fludrocortisone 0.1 MG tablet Commonly known as: FLORINEF Take 1 tablet 3 days a week   fluticasone 50 MCG/ACT nasal spray Commonly known as: FLONASE Place 1 spray into both nostrils daily as needed for allergies or rhinitis.   gabapentin 300 MG capsule Commonly known as: NEURONTIN Take 2-3 capsules (600-900 mg total) by mouth at bedtime.   Horizant 600 MG Tbcr Generic drug: Gabapentin Enacarbil Take 1 tablet (600 mg total) by mouth at bedtime.   methylPREDNISolone 4 MG tablet Commonly known as: MEDROL TAKE 1 TABLET (4MG ) AT BREAKFAST AND 1/2 TABLET (2MG ) AT 4PM.   mirtazapine 15 MG tablet Commonly known as: REMERON Take 15 mg by mouth at bedtime.   multivitamin tablet Take 1 tablet by mouth in the morning.   OVER THE COUNTER MEDICATION Take 1 capsule by mouth at bedtime. Sleep 3   polyethylene glycol 17 g packet Commonly known as: MiraLax Take 17 g by mouth 2 (two) times daily. 17 grams in 6 oz of favorite drink twice a day until bowel movement.  LAXITIVE.  Restart if two days since last bowel movement   SYSTANE OP Place 2 drops into both eyes daily as needed (for dry  eyes).   tamsulosin 0.4 MG Caps capsule Commonly known as: FLOMAX 1 capsule   UNABLE TO FIND Take 1 tablet by mouth daily. Med Name: OTC Sleep 3   VITAMIN B COMPLEX PO Take 1 capsule by mouth in the morning.   VITAMIN C PO Take 1 tablet by mouth daily.   VITAMIN D3 PO Take 1 tablet by mouth in the morning.        Review of Systems    Genitourinary: She has had difficulty emptying her bladder and is being treated by urologist   OSTEOPOROSIS: This is  being treated by her orthopedic surgeon with Prolia  Previous bone density in 2016 showed the following: AP LUMBAR SPINE L1 through L4 Young Adult T-Score:  -2.4 LEFT FEMUR NECK: Young Adult  T-Score: -3.1  Last bone density in 12/20 showed improvement in her T-scores up to -2.7 at the right femur and - 3.6 at the radius Previously in 12/2016 has T score of -3.3 at the dual femur neck right and -2.3 for the L1-L3  Vitamin D deficiency: She is on 1000 units vitamin D3      Objective:   Physical Exam  BP 138/64 (Patient Position: Standing)   Pulse 100   Ht 5' (1.524 m)   Wt 116 lb 6.4 oz (52.8 kg)   SpO2 95%   BMI 22.73 kg/m      Assessment:        Orthostatic dysautonomic hypotension syndrome:   Her orthostatic hypotension has been now consistently well controlled For some time has been taking 0.1 mg Florinef 3 days a week  Home readings are generally not below normal Regular measurements including standing  Blood pressure in the office was lower on the second measurement but she is asymptomatic with a systolic of 110 today   Adrenal insufficiency:  She has long-standing secondary adrenal insufficiency with fairly typical symptoms at baseline and very low cortisol levels on the stimulation test Again symptoms are consistently well controlled on methylprednisolone 4 mg in the morning and 2 mg in the evening She will continue the same dosage    Hypercalcemia: Calcium is now better although variable  this year Likely has mild hyperparathyroidism   She is being treated for osteoporosis with Prolia by her orthopedic specialist with improvement in her bone density on last measurement in 2020 Recent levels not available  Fatigue: This is unexplained by any endocrine problems   Plan:        Continue Florinef 0.1 mg 3 days a week  Again she can take an extra tablet if her blood pressure is dropping with symptoms of lightheadedness No change in methylprednisolone regimen   Further recommendations as above Discussed need to continue Prolia long-term  For her hyperparathyroidism would not recommend surgery unless she has worsening of osteoporosis    Reather Littler  Note: This office note was prepared with Insurance underwriter. Any transcriptional errors that result from this process are unintentional.

## 2022-01-04 ENCOUNTER — Encounter: Payer: Self-pay | Admitting: Neurology

## 2022-01-06 MED ORDER — TOPIRAMATE 25 MG PO TABS: ORAL_TABLET | ORAL | 5 refills | Status: DC

## 2022-01-06 NOTE — Addendum Note (Signed)
Addended by: Glean Salvo on: 01/06/2022 10:18 AM   Modules accepted: Orders

## 2022-01-06 NOTE — Telephone Encounter (Signed)
Sent mychart msg informing pt of follow up scheduled with Dr. Terrace Arabia for 05/16/22 at 3:00pm

## 2022-01-07 ENCOUNTER — Encounter: Payer: Self-pay | Admitting: Internal Medicine

## 2022-01-07 NOTE — Telephone Encounter (Signed)
From patient.

## 2022-01-07 NOTE — Telephone Encounter (Signed)
Ok she can just manage with that dr when she sees them

## 2022-01-10 ENCOUNTER — Ambulatory Visit: Payer: PPO | Admitting: Endocrinology

## 2022-01-10 ENCOUNTER — Encounter: Payer: Self-pay | Admitting: Endocrinology

## 2022-01-10 VITALS — BP 130/62 | HR 89 | Ht 61.0 in | Wt 116.2 lb

## 2022-01-10 DIAGNOSIS — E2749 Other adrenocortical insufficiency: Secondary | ICD-10-CM | POA: Diagnosis not present

## 2022-01-10 DIAGNOSIS — E213 Hyperparathyroidism, unspecified: Secondary | ICD-10-CM | POA: Diagnosis not present

## 2022-01-10 DIAGNOSIS — I951 Orthostatic hypotension: Secondary | ICD-10-CM | POA: Diagnosis not present

## 2022-01-10 DIAGNOSIS — R5382 Chronic fatigue, unspecified: Secondary | ICD-10-CM

## 2022-01-10 NOTE — Patient Instructions (Addendum)
Leave off Losartan

## 2022-01-10 NOTE — Progress Notes (Unsigned)
Patient ID: Katrina Rivera, female   DOB: Oct 26, 1945, 76 y.o.   MRN: [id number]   Subjective:      Chief complaint: Endocrinology follow-up  PROBLEM 1: Dysautonomic orthostatic hypotension   PAST history: She has had long-standing problems with orthostatic hypotension and also hyponatremia. Has been diagnosed with dysautonomia and has multiple other problems related to autonomic neuropathy.   She has been on Florinef since 2004 previously taking 3 tablets daily along with 5 tablets of potassium which had previously controlled her symptoms well . Because of persistent orthostatic symptoms she had been tried on midodrine on 05/28/12 but this was later stopped when blood pressure increased.  She  had medication adjustments done frequently over the last year for regulating her blood pressure.  RECENT history:   She has  been on variable doses of Florinef long-term, as much as 3 a day previously,   Has been on 1 tablet 3 days a week  She is checking her blood pressure regularly with an [employer] meter, both sitting and standing as directed Has not documented any readings in the last 10 days or so  In October her blood pressure readings have been as low as 83 systolic standing up May tend to have high readings sitting down, highest 156 in the last couple of months and recently about 130-140 systolic sitting and 97-121 standing  When being seen for chest pain her blood pressure was 190 in the cardiologist office and she was started on losartan and metoprolol More recently has started having some lightheadedness at times  Potassium has been consistently normal without any supplements    Lab Results  Component Value Date   CREATININE 0.92 12/22/2021   BUN 20 12/22/2021   NA 138 12/22/2021   K 3.9 12/22/2021   CL 102 12/22/2021   CO2 32 12/22/2021     Adrenal Insufficiency:   This is secondary to pituitary dysfunction  Prior testing included Cortrosyn test showing  stimulated level of 15.7, baseline 3.9.  Also confirmed by 24 hour urine free cortisol which was only 3.0.  Although previously she had tolerated oral hydrocortisone and small doses with improvement in her symptoms she did not continue this long-term She was again symptomatic with weight loss, decreased appetite, nausea and also diarrhea Cortrosyn stimulation test on 03/12/14 showed baseline cortisol level of 0.3 and post injection of 1.1 only  She had GI side effects from hydrocortisone and prednisone and was in the past using hydrocortisone injections using insulin syringe   RECENT HISTORY:  She feels well on methylprednisolone without any nausea or weight loss She does tend to have fatigue chronically She is taking 4 mg in the early morning and half tablet in the late afternoon very regularly Did not feel as well when she tried to reduce the dose  She knows to take stress doses for acute illnesses  Wt Readings from Last 3 Encounters:  01/10/22 116 lb 3.2 oz (52.7 kg)  12/27/21 114 lb 12.8 oz (52.1 kg)  07/30/21 116 lb (52.6 kg)     General Endocrinology:   HYPERCALCEMIA:  She has had intermittent hypercalcemia since at least 2018, previously only temporarily associated with renal dysfunction Her PTH level was 31 previously and subsequently 23 and 38 1, 25 vitamin D level is normal  However her calcium is variable, again upper normal in the lab Blood calcium level was high at 11.2 when drawn by her osteoporosis physician  She is not on calcium supplements  She does have osteoporosis as of 2016, taking Prolia from orthopedic surgeon every 6 months She was told by orthopedic surgeon to take vitamin D   Last bone density in 3/23 showed the following results BMD at the hip shows T score of -2.8, unchanged and was -1.60 at the spine   Lab Results  Component Value Date   PTH 38 06/01/2020   PTH Comment 06/01/2020   CALCIUM 10.1 12/22/2021   PHOS 1.9 (L) 07/23/2006      Allergies as of 01/10/2022       Reactions   Amoxicillin Other (See Comments)   Upset stomach   Aspirin    bruising   Azithromycin    stomach upset   Doxycycline Hyclate    GI upset   Tizanidine Other (See Comments)   Reported feeling jittery on this medication.   Buspirone Hcl Palpitations   Mirtazapine Palpitations   Weight gain        Medication List        Accurate as of January 10, 2022  3:48 PM. If you have any questions, ask your nurse or doctor.          acetaminophen 500 MG tablet Commonly known as: TYLENOL Take 1,000 mg by mouth daily as needed for moderate pain.   alfuzosin 10 MG 24 hr tablet Commonly known as: UROXATRAL Take 10 mg by mouth daily with breakfast.   cephALEXin 250 MG capsule Commonly known as: KEFLEX Take 250 mg by mouth every other day.   cholecalciferol 25 MCG (1000 UNIT) tablet Commonly known as: VITAMIN D3 Take 1,000 Units by mouth daily.   CRANBERRY PO Take 1 tablet by mouth 2 (two) times daily.   dicyclomine 10 MG capsule Commonly known as: BENTYL Take 10 mg by mouth in the morning, at noon, and at bedtime.   DULoxetine 60 MG capsule Commonly known as: CYMBALTA Take 60 mg by mouth daily.   ferrous sulfate 325 (65 FE) MG tablet Take 325 mg by mouth daily.   fludrocortisone 0.1 MG tablet Commonly known as: FLORINEF Take 1 tablet daily   fluticasone 0.05 % cream Commonly known as: CUTIVATE Apply 1 application  topically 2 (two) times daily as needed for irritation.   fluticasone 50 MCG/ACT nasal spray Commonly known as: FLONASE Place 1 spray into both nostrils daily as needed for allergies or rhinitis.   gabapentin 300 MG capsule Commonly known as: NEURONTIN Take 2-3 capsules (600-900 mg total) by mouth at bedtime.   Horizant 600 MG Tbcr Generic drug: Gabapentin Enacarbil TAKE ONE TABLET BY MOUTH AT BEDTIME   losartan 100 MG tablet Commonly known as: COZAAR Take 1 tablet (100 mg total) by mouth  daily.   methylPREDNISolone 4 MG tablet Commonly known as: MEDROL TAKE 1 TABLET (4MG ) AT BREAKFAST AND 1/2 TABLET (2MG ) AT 4PM.   metoprolol succinate 50 MG 24 hr tablet Commonly known as: TOPROL-XL Take 1 tablet (50 mg total) by mouth daily. Take with or immediately following a meal.   multivitamin tablet Take 1 tablet by mouth in the morning.   OVER THE COUNTER MEDICATION Take 1 capsule by mouth at bedtime. Sleep 3   polyethylene glycol 17 g packet Commonly known as: MiraLax Take 17 g by mouth 2 (two) times daily. 17 grams in 6 oz of favorite drink twice a day until bowel movement.  LAXITIVE.  Restart if two days since last bowel movement What changed:  when to take this reasons to take this additional instructions  PROBIOTIC PO Take 1 capsule by mouth daily.   promethazine 25 MG tablet Commonly known as: PHENERGAN Take 25 mg by mouth every 6 (six) hours as needed for nausea or vomiting.   propranolol 10 MG tablet Commonly known as: INDERAL Take 10 mg by mouth 3 (three) times daily as needed.   SYSTANE OP Place 2 drops into both eyes daily as needed (for dry eyes).   tiZANidine 2 MG tablet Commonly known as: ZANAFLEX Take 4 mg by mouth at bedtime.   topiramate 25 MG tablet Commonly known as: TOPAMAX Take 1 tablet at bedtime for 1 week, then take 2 tablets at bedtime   VITAMIN B COMPLEX PO Take 1 capsule by mouth in the morning.   VITAMIN C PO Take 1 tablet by mouth daily.        Review of Systems    Genitourinary: She has had difficulty emptying her bladder and is being treated by urologist   OSTEOPOROSIS: This is  being treated by her orthopedic surgeon with Prolia  Previous bone density in 2016 showed the following: AP LUMBAR SPINE L1 through L4 Young Adult T-Score:  -2.4 LEFT FEMUR NECK: Young Adult T-Score: -3.1  Last bone density in    12/20 showed improvement in her T-scores up to -2.7 at the right femur and - 3.6 at the  radius Previously in 12/2016 has T score of -3.3 at the dual femur neck right and -2.3 for the L1-L3  Vitamin D deficiency: She is on 1000 units vitamin D3   She is asking about hyperthyroidism but her recent thyroid levels are normal Recently having symptoms of feeling jittery and shaky and at times higher pulse rate  Lab Results  Component Value Date   TSH 1.19 12/22/2021   TSH 2.21 08/21/2018   TSH 1.30 02/10/2017   FREET4 0.70 12/22/2021   FREET4 0.77 08/21/2018   FREET4 0.83 02/10/2017      Objective:   Physical Exam  BP 130/62 (Patient Position: Standing)   Pulse 89   Ht 5\' 1"  (1.549 m)   Wt 116 lb 3.2 oz (52.7 kg)   SpO2 96%   BMI 21.96 kg/m      Assessment:        Orthostatic dysautonomic hypotension syndrome:   Her orthostatic hypotension has been now consistently well controlled For some time has been taking 0.1 mg Florinef 3 days a week  Home readings are somewhat variable but only rarely low She also has no high readings and discussed that readings in practically all doctors offices are higher because of whitecoat syndrome   Adrenal insufficiency:  She has long-standing secondary adrenal insufficiency with fairly typical symptoms at baseline and very low cortisol levels on the stimulation test Her symptoms are consistently well controlled on methylprednisolone 4 mg in the morning and 2 mg in the evening She will continue the same dosage  Hypercalcemia: Calcium is upper normal in the lab Discussed that periodically if she has had high calcium levels may be related to the venipuncture technique  Likely has mild hyperparathyroidism   She is being treated for osteoporosis with Prolia by her orthopedic specialist with stable bone density  Fatigue: Again this is unexplained by any endocrine problems and recently may be related to anxiety and insomnia   Plan:       Stop losartan Continue Florinef 0.1 mg 3 days a week  She needs to notify her  physicians that she tends to have whitecoat syndrome To rely on  her home monitor readings  No change in methylprednisolone regimen for adrenal insufficiency  For her hyperparathyroidism would not recommend surgery unless she has worsening of osteoporosis or significantly higher calcium Continue to monitor calcium periodically   Reather Littler  Note: This office note was prepared with Insurance underwriter. Any transcriptional errors that result from this process are unintentional.

## 2022-01-11 DIAGNOSIS — M5416 Radiculopathy, lumbar region: Secondary | ICD-10-CM | POA: Diagnosis not present

## 2022-01-12 ENCOUNTER — Telehealth: Payer: Self-pay | Admitting: Neurology

## 2022-01-12 NOTE — Telephone Encounter (Signed)
Pt states that she is unable to take topiramate (TOPAMAX) 25 MG tablet [[id number]] until Friday and wants to know if she can take tylenol in the meantime for her headaches. Pt is okay with a mychart msg for response

## 2022-01-18 ENCOUNTER — Other Ambulatory Visit: Payer: PPO

## 2022-01-19 ENCOUNTER — Encounter: Payer: Self-pay | Admitting: Neurology

## 2022-01-21 ENCOUNTER — Telehealth: Payer: Self-pay | Admitting: Neurology

## 2022-01-21 NOTE — Telephone Encounter (Signed)
Katrina Rivera is calling from [facility]. Said she needs a prescription for topiramate (TOPAMAX) 100 mg. Stated she received a request to just refill topiramate (TOPAMAX) 25 MG tablet.

## 2022-01-24 MED ORDER — TOPIRAMATE 100 MG PO TABS: 100.0000 mg | ORAL_TABLET | Freq: Every day | ORAL | 3 refills | Status: DC

## 2022-01-24 NOTE — Addendum Note (Signed)
Addended by: Ann Maki on: 01/24/2022 01:56 PM   Modules accepted: Orders

## 2022-01-25 ENCOUNTER — Other Ambulatory Visit: Payer: Self-pay | Admitting: Neurology

## 2022-01-25 DIAGNOSIS — J3089 Other allergic rhinitis: Secondary | ICD-10-CM | POA: Diagnosis not present

## 2022-01-25 DIAGNOSIS — I10 Essential (primary) hypertension: Secondary | ICD-10-CM | POA: Diagnosis not present

## 2022-01-25 DIAGNOSIS — R519 Headache, unspecified: Secondary | ICD-10-CM | POA: Diagnosis not present

## 2022-01-25 DIAGNOSIS — G35 Multiple sclerosis: Secondary | ICD-10-CM | POA: Diagnosis not present

## 2022-01-26 DIAGNOSIS — R350 Frequency of micturition: Secondary | ICD-10-CM | POA: Diagnosis not present

## 2022-01-27 ENCOUNTER — Ambulatory Visit: Payer: PPO

## 2022-01-27 DIAGNOSIS — E782 Mixed hyperlipidemia: Secondary | ICD-10-CM

## 2022-01-27 DIAGNOSIS — I10 Essential (primary) hypertension: Secondary | ICD-10-CM | POA: Diagnosis not present

## 2022-01-27 DIAGNOSIS — R072 Precordial pain: Secondary | ICD-10-CM

## 2022-01-27 DIAGNOSIS — R079 Chest pain, unspecified: Secondary | ICD-10-CM

## 2022-02-02 ENCOUNTER — Ambulatory Visit
Admission: RE | Admit: 2022-02-02 | Discharge: 2022-02-02 | Disposition: A | Discharge status: Home / Self Care | Payer: PPO | Source: Ambulatory Visit | Attending: Gastroenterology | Admitting: Gastroenterology

## 2022-02-02 ENCOUNTER — Other Ambulatory Visit: Payer: Self-pay | Admitting: Gastroenterology

## 2022-02-02 DIAGNOSIS — R197 Diarrhea, unspecified: Secondary | ICD-10-CM

## 2022-02-02 DIAGNOSIS — R109 Unspecified abdominal pain: Secondary | ICD-10-CM | POA: Diagnosis not present

## 2022-02-02 NOTE — Progress Notes (Signed)
Spoke with patient about results, she acknowledged the information and had no further questions.

## 2022-02-02 NOTE — Progress Notes (Signed)
normal

## 2022-02-03 ENCOUNTER — Encounter (HOSPITAL_COMMUNITY): Payer: Self-pay

## 2022-02-03 ENCOUNTER — Emergency Department (HOSPITAL_COMMUNITY): Payer: PPO

## 2022-02-03 ENCOUNTER — Other Ambulatory Visit: Payer: Self-pay

## 2022-02-03 ENCOUNTER — Emergency Department (HOSPITAL_COMMUNITY)
Admission: EM | Admit: 2022-02-03 | Discharge: 2022-02-04 | Disposition: A | Discharge status: Home / Self Care | Payer: PPO | Attending: Student | Admitting: Student

## 2022-02-03 DIAGNOSIS — Z96652 Presence of left artificial knee joint: Secondary | ICD-10-CM | POA: Diagnosis not present

## 2022-02-03 DIAGNOSIS — R197 Diarrhea, unspecified: Secondary | ICD-10-CM | POA: Diagnosis not present

## 2022-02-03 DIAGNOSIS — I10 Essential (primary) hypertension: Secondary | ICD-10-CM | POA: Insufficient documentation

## 2022-02-03 DIAGNOSIS — K5900 Constipation, unspecified: Secondary | ICD-10-CM | POA: Insufficient documentation

## 2022-02-03 DIAGNOSIS — Z79899 Other long term (current) drug therapy: Secondary | ICD-10-CM | POA: Diagnosis not present

## 2022-02-03 DIAGNOSIS — K8689 Other specified diseases of pancreas: Secondary | ICD-10-CM | POA: Diagnosis not present

## 2022-02-03 LAB — CBC WITH DIFFERENTIAL/PLATELET
Abs Immature Granulocytes: 0.1 10*3/uL — ABNORMAL HIGH (ref 0.00–0.07)
Basophils Absolute: 0.1 10*3/uL (ref 0.0–0.1)
Basophils Relative: 1 %
Eosinophils Absolute: 0.1 10*3/uL (ref 0.0–0.5)
Eosinophils Relative: 1 %
HCT: 38.7 % (ref 36.0–46.0)
Hemoglobin: 12.4 g/dL (ref 12.0–15.0)
Immature Granulocytes: 1 %
Lymphocytes Relative: 30 %
Lymphs Abs: 2.7 10*3/uL (ref 0.7–4.0)
MCH: 31.5 pg (ref 26.0–34.0)
MCHC: 32 g/dL (ref 30.0–36.0)
MCV: 98.2 fL (ref 80.0–100.0)
Monocytes Absolute: 0.8 10*3/uL (ref 0.1–1.0)
Monocytes Relative: 9 %
Neutro Abs: 5.2 10*3/uL (ref 1.7–7.7)
Neutrophils Relative %: 58 %
Platelets: 265 10*3/uL (ref 150–400)
RBC: 3.94 MIL/uL (ref 3.87–5.11)
RDW: 13.5 % (ref 11.5–15.5)
WBC: 8.8 10*3/uL (ref 4.0–10.5)
nRBC: 0 % (ref 0.0–0.2)

## 2022-02-03 LAB — URINALYSIS, ROUTINE W REFLEX MICROSCOPIC
Bacteria, UA: NONE SEEN
Bilirubin Urine: NEGATIVE
Glucose, UA: NEGATIVE mg/dL
Hgb urine dipstick: NEGATIVE
Ketones, ur: NEGATIVE mg/dL
Nitrite: NEGATIVE
Protein, ur: NEGATIVE mg/dL
Specific Gravity, Urine: 1.021 (ref 1.005–1.030)
pH: 5 (ref 5.0–8.0)

## 2022-02-03 LAB — LIPASE, BLOOD: Lipase: 54 U/L — ABNORMAL HIGH (ref 11–51)

## 2022-02-03 LAB — COMPREHENSIVE METABOLIC PANEL
ALT: 32 U/L (ref 0–44)
AST: 39 U/L (ref 15–41)
Albumin: 4.8 g/dL (ref 3.5–5.0)
Alkaline Phosphatase: 38 U/L (ref 38–126)
Anion gap: 8 (ref 5–15)
BUN: 18 mg/dL (ref 8–23)
CO2: 22 mmol/L (ref 22–32)
Calcium: 10.3 mg/dL (ref 8.9–10.3)
Chloride: 109 mmol/L (ref 98–111)
Creatinine, Ser: 1.16 mg/dL — ABNORMAL HIGH (ref 0.44–1.00)
GFR, Estimated: 49 mL/min — ABNORMAL LOW (ref >60)
Glucose, Bld: 106 mg/dL — ABNORMAL HIGH (ref 70–99)
Potassium: 3.7 mmol/L (ref 3.5–5.1)
Sodium: 139 mmol/L (ref 135–145)
Total Bilirubin: 0.6 mg/dL (ref 0.3–1.2)
Total Protein: 7.6 g/dL (ref 6.5–8.1)

## 2022-02-03 MED ORDER — IOHEXOL 300 MG/ML  SOLN
100.0000 mL | Freq: Once | INTRAMUSCULAR | Status: AC | PRN
  Administered 2022-02-03: 100 mL via INTRAVENOUS

## 2022-02-03 MED ORDER — AJOVY 225 MG/1.5ML ~~LOC~~ SOAJ: 225.0000 mg | SUBCUTANEOUS | 11 refills | Status: DC

## 2022-02-03 NOTE — ED Notes (Signed)
Pt needs labs and 22 g or greater IV for CT  

## 2022-02-03 NOTE — Addendum Note (Signed)
Addended by: Glean Salvo on: 02/03/2022 08:10 AM   Modules accepted: Orders

## 2022-02-03 NOTE — ED Notes (Signed)
Patient transported to CT 

## 2022-02-03 NOTE — ED Triage Notes (Signed)
Patient reports that she had diarrhea x 1 week. Patient reports that she was ordered an x-ray and was completed 2 days ago. Patient states she was called today and was told to come to the ED for a partial small bowel obstruction

## 2022-02-04 NOTE — ED Notes (Signed)
Patient verbalized understanding of discharge instructions and reasons to return to the ED 

## 2022-02-04 NOTE — ED Provider Notes (Signed)
[facility]-EMERGENCY DEPT Provider Note  CSN: [id number] Arrival date & time: 02/03/22 1658  Chief Complaint(s) No chief complaint on file.  HPI Katrina Rivera is a 76 y.o. female with PMH Addison's disease on Solu-Cortef, MS, fibromyalgia who presents emergency department for evaluation of abdominal pain, diarrhea and concern for small bowel obstruction.  Patient states that she had a abdominal x-ray performed 48 hours ago and received a call due to concern for developing partial small bowel obstruction and was informed she needs to come to the emergency department for further workup.  She currently denies abdominal pain nausea or vomiting.  She states that she has scant watery diarrhea but has recently stopped taking her MiraLAX that she normally takes for constipation.  Denies chest pain, shortness of breath, headache, fever or other systemic symptoms.   Past Medical History Past Medical History:  Diagnosis Date   Addison's disease (HCC)    takes Solu Cortef daily   Anemia    takes Ferrous Sulfate daily   Anxiety    takes Xanax nightly   Arthritis    Chronic back pain    stenosis   Depression    takes Cymbalta daily   Dizziness    if b/p drops    Fibromyalgia    Fibromyalgia    History of blood transfusion    no abnormal reaction noted   History of bronchitis    many yrs ago    Hypokalemia    takes Potassium daily   Hypotension    Hypotension    takes Florinef daily   IBS (irritable bowel syndrome)    takes Align daily   Insomnia    takes Trazodone nightly   Joint pain    Multiple sclerosis (HCC)    doesn't take any meds   Multiple sclerosis (HCC)    New onset headache 12/22/2021   Nocturia    Osteoporosis    Peripheral neuropathy    Primary localized osteoarthritis of left knee 08/11/2020   Restless leg syndrome    Seasonal allergies    takes Zyrtec daily;uses Flonase daily as needed   Syncope    Urinary frequency    takes Flomax  daily   Weakness    numbness and tingling   Patient Active Problem List   Diagnosis Date Noted   Mixed hyperlipidemia 12/27/2021   Chest pain of uncertain etiology 12/27/2021   New onset headache 12/22/2021   S/P lumbar fusion 07/30/2021   Primary localized osteoarthritis of left knee 08/11/2020   Atrophic vaginitis 08/11/2020   Menopausal symptom 08/11/2020   Restless leg syndrome    Chronic back pain    Low back pain without sciatica 11/01/2018   Essential hypertension    SBO (small bowel obstruction) (HCC) 09/01/2016   Fibromyalgia    Abdominal pain, vomiting, and diarrhea    Dehydration    Somnolence, daytime 10/22/2015   Fatigue 10/22/2015   Chronic leg pain 06/29/2015   S/P lumbar spinal fusion 06/18/2014   Abnormality of gait 02/18/2013   S/P lumbar microdiscectomy 01/24/2013   Hypokalemia 12/24/2012   Sweating abnormality 10/19/2012   Dysautonomia orthostatic hypotension syndrome 08/16/2012   Hereditary and idiopathic peripheral neuropathy 08/16/2012   Chronic adrenal insufficiency (HCC) 08/16/2012   MS (multiple sclerosis) (HCC) 08/15/2012   Home Medication(s) Prior to Admission medications   Medication Sig Start Date End Date Taking? Authorizing Provider  acetaminophen (TYLENOL) 500 MG tablet Take 1,000 mg by mouth daily as needed for moderate pain.  [provider]  alfuzosin (UROXATRAL) 10 MG 24 hr tablet Take 10 mg by mouth daily with breakfast.    [provider]  Ascorbic Acid (VITAMIN C PO) Take 1 tablet by mouth daily.    [provider]  B Complex Vitamins (VITAMIN B COMPLEX PO) Take 1 capsule by mouth in the morning.    [provider]  cephALEXin (KEFLEX) 250 MG capsule Take 250 mg by mouth every other day.    [provider]  cholecalciferol (VITAMIN D3) 25 MCG (1000 UNIT) tablet Take 1,000 Units by mouth daily.    [provider]  CRANBERRY PO Take 1 tablet by mouth 2 (two) times daily.     [provider]  dicyclomine (BENTYL) 10 MG capsule Take 10 mg by mouth in the morning, at noon, and at bedtime.    [provider]  DULoxetine (CYMBALTA) 60 MG capsule Take 60 mg by mouth daily.    [provider]  ferrous sulfate 325 (65 FE) MG tablet Take 325 mg by mouth daily.    [provider]  fludrocortisone (FLORINEF) 0.1 MG tablet Take 1 tablet daily 08/05/21   Reather Littler, MD  fluticasone (CUTIVATE) 0.05 % cream Apply 1 application  topically 2 (two) times daily as needed for irritation. 06/09/21   [provider]  fluticasone (FLONASE) 50 MCG/ACT nasal spray Place 1 spray into both nostrils daily as needed for allergies or rhinitis.    [provider]  Fremanezumab-vfrm (AJOVY) 225 MG/1.5ML SOAJ Inject 225 mg into the skin every 30 (thirty) days. 02/03/22   Glean Salvo, NP  gabapentin (NEURONTIN) 300 MG capsule Take 2-3 capsules (600-900 mg total) by mouth at bedtime. 11/29/21   Levert Feinstein, MD  HORIZANT 600 MG TBCR TAKE ONE TABLET BY MOUTH AT BEDTIME 12/13/21   Levert Feinstein, MD  losartan (COZAAR) 100 MG tablet Take 1 tablet (100 mg total) by mouth daily. 12/27/21 03/27/22  Custovic, Rozell Searing, DO  methylPREDNISolone (MEDROL) 4 MG tablet TAKE 1 TABLET ( ) AT BREAKFAST AND 1/2 TABLET ( ) AT 4PM. 07/13/21   Reather Littler, MD  metoprolol succinate (TOPROL-XL) 50 MG 24 hr tablet Take 1 tablet (50 mg total) by mouth daily. Take with or immediately following a meal. 12/27/21 03/27/22  Custovic, Rozell Searing, DO  Multiple Vitamin (MULTIVITAMIN) tablet Take 1 tablet by mouth in the morning.    [provider]  OVER THE COUNTER MEDICATION Take 1 capsule by mouth at bedtime. Sleep 3    [provider]  Polyethyl Glycol-Propyl Glycol (SYSTANE OP) Place 2 drops into both eyes daily as needed (for dry eyes).    [provider]  polyethylene glycol (MIRALAX) 17 g packet Take 17 g by mouth 2 (two) times daily. 17 grams in 6 oz of favorite  drink twice a day until bowel movement.  LAXITIVE.  Restart if two days since last bowel movement Patient taking differently: Take 17 g by mouth daily as needed for moderate constipation. 08/24/20   Shepperson, Kirstin, PA-C  Probiotic Product (PROBIOTIC PO) Take 1 capsule by mouth daily.    [provider]  promethazine (PHENERGAN) 25 MG tablet Take 25 mg by mouth every 6 (six) hours as needed for nausea or vomiting.    [provider]  propranolol (INDERAL) 10 MG tablet Take 10 mg by mouth 3 (three) times daily as needed. 12/14/21   [provider]  tiZANidine (ZANAFLEX) 2 MG tablet Take 4 mg by mouth at bedtime.  [provider]  topiramate (TOPAMAX) 100 MG tablet Take 1 tablet (100 mg total) by mouth daily. 01/24/22   Glean Salvo, NP                                                                                                                                    Past Surgical History Past Surgical History:  Procedure Laterality Date   ABDOMINAL HYSTERECTOMY  02/07/1986   APPENDECTOMY  02/07/1986   CESAREAN SECTION  1973/1977   x2   CHOLECYSTECTOMY  02/08/1995   COLECTOMY  02/08/1988   COLONOSCOPY     ESOPHAGOGASTRODUODENOSCOPY     EYE SURGERY     bilateral - /w IOL   FRACTURE SURGERY Right    rods and screws-right leg   LUMBAR LAMINECTOMY/DECOMPRESSION MICRODISCECTOMY Left 01/24/2013   Procedure: LUMBAR FIVE TO SACRAL ONE LUMBAR LAMINECTOMY/DECOMPRESSION MICRODISCECTOMY 1 LEVEL;  Surgeon: Tia Alert, MD;  Location: [facility];  Service: Neurosurgery;  Laterality: Left;   MAXIMUM ACCESS (MAS)POSTERIOR LUMBAR INTERBODY FUSION (PLIF) 1 LEVEL N/A 06/18/2014   Procedure: MAXIMUM ACCESS SURGERY POSTERIOR LUMBAR INTERBODY FUSION LUMBAR FIVE TO SACRAL ONE ;  Surgeon: Tia Alert, MD;  Location: [facility];  Service: Neurosurgery;  Laterality: N/A;   SPINAL CORD STIMULATOR BATTERY EXCHANGE Right 07/30/2021   Procedure: Spinal cord stimulator  battery replacement;  Surgeon: Tia Alert, MD;  Location: [facility] OR;  Service: Neurosurgery;  Laterality: Right;   SPINAL CORD STIMULATOR IMPLANT     TOTAL KNEE ARTHROPLASTY Left 08/24/2020   Procedure: TOTAL KNEE ARTHROPLASTY;  Surgeon: Salvatore Marvel, MD;  Location: [facility];  Service: Orthopedics;  Laterality: Left;   Family History Family History  Problem Relation Age of Onset   Hypertension Mother    Stroke Mother    Heart attack Father    Tremor Brother     Social History Social History   Tobacco Use   Smoking status: Never   Smokeless tobacco: Never  Vaping Use   Vaping Use: Never used  Substance Use Topics   Alcohol use: Never   Drug use: Never   Allergies Amoxicillin, Aspirin, Azithromycin, Doxycycline hyclate, Tizanidine, Buspirone hcl, and Mirtazapine  Review of Systems Review of Systems  Gastrointestinal:  Positive for abdominal distention and diarrhea.    Physical Exam Vital Signs  I have reviewed the triage vital signs BP (!) 175/80   Pulse 73   Temp 98.9 F (37.2 C)   Resp 16   Ht 5' (1.524 m)   Wt 52.2 kg   SpO2 99%   BMI 22.46 kg/m   Physical Exam Vitals and nursing note reviewed.  Constitutional:      General: She is not in acute distress.    Appearance: She is well-developed.  HENT:     Head: Normocephalic and atraumatic.  Eyes:     Conjunctiva/sclera: Conjunctivae normal.  Cardiovascular:     Rate and Rhythm:  Normal rate and regular rhythm.     Heart sounds: No murmur heard. Pulmonary:     Effort: Pulmonary effort is normal. No respiratory distress.     Breath sounds: Normal breath sounds.  Abdominal:     Palpations: Abdomen is soft.     Tenderness: There is no abdominal tenderness.  Musculoskeletal:        General: No swelling.     Cervical back: Neck supple.  Skin:    General: Skin is warm and dry.     Capillary Refill: Capillary refill takes less than 2 seconds.  Neurological:     Mental Status: She is alert.  Psychiatric:         Mood and Affect: Mood normal.     ED Results and Treatments Labs (all labs ordered are listed, but only abnormal results are displayed) Labs Reviewed  CBC WITH DIFFERENTIAL/PLATELET - Abnormal; Notable for the following components:      Result Value   Abs Immature Granulocytes 0.10 (*)    All other components within normal limits  COMPREHENSIVE METABOLIC PANEL - Abnormal; Notable for the following components:   Glucose, Bld 106 (*)    Creatinine, Ser 1.16 (*)    GFR, Estimated 49 (*)    All other components within normal limits  LIPASE, BLOOD - Abnormal; Notable for the following components:   Lipase 54 (*)    All other components within normal limits  URINALYSIS, ROUTINE W REFLEX MICROSCOPIC - Abnormal; Notable for the following components:   Color, Urine AMBER (*)    APPearance HAZY (*)    Leukocytes,Ua TRACE (*)    All other components within normal limits                                                                                                                          Radiology CT ABDOMEN PELVIS W CONTRAST  Result Date: 02/03/2022 CLINICAL DATA:  Abdominal pain, acute.  Diarrhea for 1 week. EXAM: CT ABDOMEN AND PELVIS WITH CONTRAST TECHNIQUE: Multidetector CT imaging of the abdomen and pelvis was performed using the standard protocol following bolus administration of intravenous contrast. RADIATION DOSE REDUCTION: This exam was performed according to the departmental dose-optimization program which includes automated exposure control, adjustment of the mA and/or kV according to patient size and/or use of iterative reconstruction technique. CONTRAST:  OMNIPAQUE IOHEXOL 300 MG/ML  SOLN COMPARISON:  CT examination dated September 01, 2016. FINDINGS: Lower chest: No acute abnormality. Hepatobiliary: No focal liver abnormality is seen. Status post cholecystectomy with dilated extrahepatic common duct measuring up to 1 cm in the pancreatic head. No intrahepatic biliary  dilatation. Pancreas: Appreciable pancreatic mass. Mildly prominent distal pancreatic duct, likely secondary to dilated duct/sphincter of Oddi dysfunction. No peripancreatic inflammatory changes. Spleen: Normal in size without focal abnormality. Adrenals/Urinary Tract: Adrenal glands are unremarkable. Kidneys are normal, without renal calculi, focal lesion, or hydronephrosis. Simple subcentimeter cyst in the interpolar region of the left kidney. Bladder is unremarkable.  Stomach/Bowel: Stomach is normal. Small bowel loops are normal in caliber. Postsurgical changes for prior partial colectomy. There is distended colonic loop about the prior surgical anastomosis site without evidence of obstruction or bowel wall thickening. Moderate amount of retained stool in the rectosigmoid region. Vascular/Lymphatic: Moderate atherosclerotic disease of abdominal aorta and branch vessels which are normal in caliber. Reproductive: Status post hysterectomy. No adnexal masses. Other: No abdominal wall hernia or abnormality. No abdominopelvic ascites. Musculoskeletal: Posterior spinal fusion at L4-L5 with laminectomy changes. Interbody fusion at L4-L5 and L5-S1. No acute osseous abnormality. IMPRESSION: 1. No CT evidence of acute abdominal/pelvic process. 2. Status post cholecystectomy with dilated extrahepatic common duct measuring up to 1 cm. No intrahepatic biliary dilatation. 3. Postsurgical changes for prior partial colectomy with gaseous distention colonic loop about the prior anastomosis site without evidence of obstruction or bowel wall thickening. Moderate amount of retained stool in the rectosigmoid region. 4. Moderate atherosclerotic disease of abdominal aorta and branch vessels which is normal in caliber. 5. Posterior spinal fusion at L4-L5 with laminectomy changes. Interbody fusion at L4-L5 and L5-S1. No acute osseous abnormality. 6. Aortic atherosclerosis. Aortic Atherosclerosis (ICD10-I70.0). Electronically Signed   By:  Larose Hires D.O.   On: 02/03/2022 23:56    Pertinent labs & imaging results that were available during my care of the patient were reviewed by me and considered in my medical decision making (see MDM for details).  Medications Ordered in ED Medications  iohexol (OMNIPAQUE) 300 MG/ML solution 100 mL (100 mLs Intravenous Contrast Given 02/03/22 2337)                                                                                                                                     Procedures Procedures  (including critical care time)  Medical Decision Making / ED Course   This patient presents to the ED for concern of diarrhea, abnormal imaging, this involves an extensive number of treatment options, and is a complaint that carries with it a high risk of complications and morbidity.  The differential diagnosis includes partial small bowel obstruction, constipation, dysmotility, electrolyte abnormality, ileus  MDM: Patient seen emergency room for evaluation of diarrhea and an abnormal x-ray.  Physical exam largely unremarkable.  Laboratory evaluation unremarkable outside of a very mild lipase elevation but with no epigastric tenderness to palpation I have low suspicion for acute pancreatitis.  CT abdomen pelvis does not show a small bowel obstruction and does show some gaseous distention around the prior anastomosis site as well as moderate retained stool in the rectosigmoid colon.  Patient is very well-appearing here in the emergency department and she was instructed to restart her MiraLAX until she has a solid bowel movement.  She was and discharged with outpatient follow-up.   Additional history obtained: -Additional history obtained from husband -External records from outside source obtained and reviewed including: Chart review including previous notes, labs, imaging, consultation notes  Lab Tests: -I ordered, reviewed, and interpreted labs.   The pertinent results include:   Labs  Reviewed  CBC WITH DIFFERENTIAL/PLATELET - Abnormal; Notable for the following components:      Result Value   Abs Immature Granulocytes 0.10 (*)    All other components within normal limits  COMPREHENSIVE METABOLIC PANEL - Abnormal; Notable for the following components:   Glucose, Bld 106 (*)    Creatinine, Ser 1.16 (*)    GFR, Estimated 49 (*)    All other components within normal limits  LIPASE, BLOOD - Abnormal; Notable for the following components:   Lipase 54 (*)    All other components within normal limits  URINALYSIS, ROUTINE W REFLEX MICROSCOPIC - Abnormal; Notable for the following components:   Color, Urine AMBER (*)    APPearance HAZY (*)    Leukocytes,Ua TRACE (*)    All other components within normal limits         Imaging Studies ordered: I ordered imaging studies including CTAP I independently visualized and interpreted imaging. I agree with the radiologist interpretation   Medicines ordered and prescription drug management: Meds ordered this encounter  Medications   iohexol (OMNIPAQUE) 300 MG/ML solution 100 mL    -I have reviewed the patients home medicines and have made adjustments as needed  Critical interventions none   Cardiac Monitoring: The patient was maintained on a cardiac monitor.  I personally viewed and interpreted the cardiac monitored which showed an underlying rhythm of: NSR  Social Determinants of Health:  Factors impacting patients care include: none   Reevaluation: After the interventions noted above, I reevaluated the patient and found that they have :improved  Co morbidities that complicate the patient evaluation  Past Medical History:  Diagnosis Date   Addison's disease (HCC)    takes Solu Cortef daily   Anemia    takes Ferrous Sulfate daily   Anxiety    takes Xanax nightly   Arthritis    Chronic back pain    stenosis   Depression    takes Cymbalta daily   Dizziness    if b/p drops    Fibromyalgia     Fibromyalgia    History of blood transfusion    no abnormal reaction noted   History of bronchitis    many yrs ago    Hypokalemia    takes Potassium daily   Hypotension    Hypotension    takes Florinef daily   IBS (irritable bowel syndrome)    takes Align daily   Insomnia    takes Trazodone nightly   Joint pain    Multiple sclerosis (HCC)    doesn't take any meds   Multiple sclerosis (HCC)    New onset headache 12/22/2021   Nocturia    Osteoporosis    Peripheral neuropathy    Primary localized osteoarthritis of left knee 08/11/2020   Restless leg syndrome    Seasonal allergies    takes Zyrtec daily;uses Flonase daily as needed   Syncope    Urinary frequency    takes Flomax daily   Weakness    numbness and tingling      Dispostion: I considered admission for this patient, but she does not meet inpatient criteria for admission she is safe for discharge the patient follow-up     Final Clinical Impression(s) / ED Diagnoses Final diagnoses:  Constipation, unspecified constipation type     @PCDICTATION @    , MD 02/04/22 0110

## 2022-02-08 ENCOUNTER — Ambulatory Visit: Payer: PPO | Admitting: Internal Medicine

## 2022-02-08 ENCOUNTER — Encounter: Payer: Self-pay | Admitting: Internal Medicine

## 2022-02-08 VITALS — BP 156/79 | HR 78 | Ht 60.0 in | Wt 115.4 lb

## 2022-02-08 DIAGNOSIS — I10 Essential (primary) hypertension: Secondary | ICD-10-CM | POA: Diagnosis not present

## 2022-02-08 DIAGNOSIS — E782 Mixed hyperlipidemia: Secondary | ICD-10-CM | POA: Diagnosis not present

## 2022-02-08 NOTE — Progress Notes (Signed)
Primary Physician/Referring:  [name], MD  Patient ID: Katrina Rivera, female    DOB: 1945-08-27, 77 y.o.   MRN: [id number]  Chief Complaint  Patient presents with   Chest Pain   Follow-up   Results   HPI:    JISELA MERLINO  is a 77 y.o. female with past medical history significant for hypertension, hyperlipidemia, and diabetes who is here for a follow-up visit. She has been doing well since the last time she was here. Patient had a lot of chest pain during her nuclear stress test but the results were negative for ischemia. She admits she has anxiety and has been very stressed lately which is likely causing her chest pain and occasional racing heart. Patient denies palpitations, diaphoresis, syncope, edema, orthopnea, PND, claudication.   Past Medical History:  Diagnosis Date   Addison's disease (HCC)    takes Solu Cortef daily   Anemia    takes Ferrous Sulfate daily   Anxiety    takes Xanax nightly   Arthritis    Chronic back pain    stenosis   Depression    takes Cymbalta daily   Dizziness    if b/p drops    Fibromyalgia    Fibromyalgia    History of blood transfusion    no abnormal reaction noted   History of bronchitis    many yrs ago    Hypokalemia    takes Potassium daily   Hypotension    Hypotension    takes Florinef daily   IBS (irritable bowel syndrome)    takes Align daily   Insomnia    takes Trazodone nightly   Joint pain    Multiple sclerosis (HCC)    doesn't take any meds   Multiple sclerosis (HCC)    New onset headache 12/22/2021   Nocturia    Osteoporosis    Peripheral neuropathy    Primary localized osteoarthritis of left knee 08/11/2020   Restless leg syndrome    Seasonal allergies    takes Zyrtec daily;uses Flonase daily as needed   Syncope    Urinary frequency    takes Flomax daily   Weakness    numbness and tingling   Past Surgical History:  Procedure Laterality Date   ABDOMINAL HYSTERECTOMY  02/07/1986   APPENDECTOMY   02/07/1986   CESAREAN SECTION  1973/1977   x2   CHOLECYSTECTOMY  02/08/1995   COLECTOMY  02/08/1988   COLONOSCOPY     ESOPHAGOGASTRODUODENOSCOPY     EYE SURGERY     bilateral - /w IOL   FRACTURE SURGERY Right    rods and screws-right leg   LUMBAR LAMINECTOMY/DECOMPRESSION MICRODISCECTOMY Left 01/24/2013   Procedure: LUMBAR FIVE TO SACRAL ONE LUMBAR LAMINECTOMY/DECOMPRESSION MICRODISCECTOMY 1 LEVEL;  Surgeon: Tia Alert, MD;  Location: [facility];  Service: Neurosurgery;  Laterality: Left;   MAXIMUM ACCESS (MAS)POSTERIOR LUMBAR INTERBODY FUSION (PLIF) 1 LEVEL N/A 06/18/2014   Procedure: MAXIMUM ACCESS SURGERY POSTERIOR LUMBAR INTERBODY FUSION LUMBAR FIVE TO SACRAL ONE ;  Surgeon: Tia Alert, MD;  Location: [facility];  Service: Neurosurgery;  Laterality: N/A;   SPINAL CORD STIMULATOR BATTERY EXCHANGE Right 07/30/2021   Procedure: Spinal cord stimulator battery replacement;  Surgeon: Tia Alert, MD;  Location: [facility];  Service: Neurosurgery;  Laterality: Right;   SPINAL CORD STIMULATOR IMPLANT     TOTAL KNEE ARTHROPLASTY Left 08/24/2020   Procedure: TOTAL KNEE ARTHROPLASTY;  Surgeon: Salvatore Marvel, MD;  Location: [facility];  Service: Orthopedics;  Laterality: Left;   Family History  Problem Relation Age of Onset   Hypertension Mother    Stroke Mother    Heart attack Father    Tremor Brother     Social History   Tobacco Use   Smoking status: Never   Smokeless tobacco: Never  Substance Use Topics   Alcohol use: Never   Marital Status: Married  ROS  Review of Systems  Cardiovascular:  Positive for chest pain.  Neurological:  Negative for light-headedness and weakness.   Objective  Blood pressure (!) 156/79, pulse 78, height 5' (1.524 m), weight 115 lb 6.4 oz (52.3 kg), SpO2 97 %. Body mass index is 22.54 kg/m.     02/08/2022   10:00 AM 02/04/2022    1:00 AM 02/04/2022   12:15 AM  Vitals with BMI  Height 5\' 0"     Weight 115 lbs 6 oz    BMI 02.54    Systolic 270  623 762  Diastolic 79 80 90  Pulse 78 73 89     Physical Exam Vitals reviewed.  HENT:     Head: Normocephalic and atraumatic.  Cardiovascular:     Rate and Rhythm: Normal rate and regular rhythm.     Pulses: Normal pulses.     Heart sounds: Normal heart sounds. No murmur heard. Pulmonary:     Effort: Pulmonary effort is normal.     Breath sounds: Normal breath sounds.  Abdominal:     General: Bowel sounds are normal.  Musculoskeletal:     Right lower leg: No edema.     Left lower leg: No edema.  Skin:    General: Skin is warm and dry.  Neurological:     Mental Status: She is alert.     Medications and allergies   Allergies  Allergen Reactions   Amoxicillin Other (See Comments)    Upset stomach   Aspirin     bruising   Azithromycin     stomach upset   Doxycycline Hyclate     GI upset   Tizanidine Other (See Comments)    Reported feeling jittery on this medication.   Buspirone Hcl Palpitations   Mirtazapine Palpitations    Weight gain     Medication list after today's encounter   Current Outpatient Medications:    acetaminophen (TYLENOL) 500 MG tablet, Take 1,000 mg by mouth daily as needed for moderate pain., Disp: , Rfl:    alfuzosin (UROXATRAL) 10 MG 24 hr tablet, Take 10 mg by mouth daily with breakfast., Disp: , Rfl:    Ascorbic Acid (VITAMIN C PO), Take 1 tablet by mouth daily., Disp: , Rfl:    B Complex Vitamins (VITAMIN B COMPLEX PO), Take 1 capsule by mouth in the morning., Disp: , Rfl:    CRANBERRY PO, Take 1 tablet by mouth 2 (two) times daily., Disp: , Rfl:    dicyclomine (BENTYL) 10 MG capsule, Take 10 mg by mouth in the morning, at noon, and at bedtime., Disp: , Rfl:    DULoxetine (CYMBALTA) 60 MG capsule, Take 60 mg by mouth daily., Disp: , Rfl:    ferrous sulfate 325 (65 FE) MG tablet, Take 325 mg by mouth daily., Disp: , Rfl:    fludrocortisone (FLORINEF) 0.1 MG tablet, Take 1 tablet daily, Disp: 90 tablet, Rfl: 0   fluticasone (CUTIVATE) 0.05  % cream, Apply 1 application  topically 2 (two) times daily as needed for irritation., Disp: , Rfl:    fluticasone (FLONASE) 50 MCG/ACT nasal  spray, Place 1 spray into both nostrils daily as needed for allergies or rhinitis., Disp: , Rfl:    Fremanezumab-vfrm (AJOVY) 225 MG/1.5ML SOAJ, Inject 225 mg into the skin every 30 (thirty) days., Disp: 1.68 mL, Rfl: 11   gabapentin (NEURONTIN) 300 MG capsule, Take 2-3 capsules (600-900 mg total) by mouth at bedtime., Disp: 270 capsule, Rfl: 1   HORIZANT 600 MG TBCR, TAKE ONE TABLET BY MOUTH AT BEDTIME, Disp: 90 tablet, Rfl: 4   hydrOXYzine (ATARAX) 10 MG tablet, Take 10-20 mg by mouth 2 (two) times daily as needed., Disp: , Rfl:    methylPREDNISolone (MEDROL) 4 MG tablet, TAKE 1 TABLET (4MG ) AT BREAKFAST AND 1/2 TABLET (2MG ) AT 4PM., Disp: 135 tablet, Rfl: 2   Multiple Vitamin (MULTIVITAMIN) tablet, Take 1 tablet by mouth in the morning., Disp: , Rfl:    OVER THE COUNTER MEDICATION, Take 1 capsule by mouth at bedtime. Sleep 3, Disp: , Rfl:    Polyethyl Glycol-Propyl Glycol (SYSTANE OP), Place 2 drops into both eyes daily as needed (for dry eyes)., Disp: , Rfl:    polyethylene glycol (MIRALAX) 17 g packet, Take 17 g by mouth 2 (two) times daily. 17 grams in 6 oz of favorite drink twice a day until bowel movement.  LAXITIVE.  Restart if two days since last bowel movement (Patient taking differently: Take 17 g by mouth daily as needed for moderate constipation.), Disp: 14 packet, Rfl: 0   Probiotic Product (PROBIOTIC PO), Take 1 capsule by mouth daily., Disp: , Rfl:    promethazine (PHENERGAN) 25 MG tablet, Take 25 mg by mouth every 6 (six) hours as needed for nausea or vomiting., Disp: , Rfl:    tiZANidine (ZANAFLEX) 2 MG tablet, Take 4 mg by mouth at bedtime., Disp: , Rfl:    cholecalciferol (VITAMIN D3) 25 MCG (1000 UNIT) tablet, Take 1,000 Units by mouth daily., Disp: , Rfl:    losartan (COZAAR) 100 MG tablet, Take 1 tablet (100 mg total) by mouth daily.  (Patient not taking: Reported on 02/08/2022), Disp: 90 tablet, Rfl: 3   metoprolol succinate (TOPROL-XL) 50 MG 24 hr tablet, Take 1 tablet (50 mg total) by mouth daily. Take with or immediately following a meal. (Patient not taking: Reported on 02/08/2022), Disp: 30 tablet, Rfl: 2   propranolol (INDERAL) 10 MG tablet, Take 10 mg by mouth 3 (three) times daily as needed. (Patient not taking: Reported on 02/08/2022), Disp: , Rfl:   Laboratory examination:   Lab Results  Component Value Date   NA 139 02/03/2022   K 3.7 02/03/2022   CO2 22 02/03/2022   GLUCOSE 106 (H) 02/03/2022   BUN 18 02/03/2022   CREATININE 1.16 (H) 02/03/2022   CALCIUM 10.3 02/03/2022   GFRNONAA 49 (L) 02/03/2022       Latest Ref Rng & Units 02/03/2022   10:06 PM 12/22/2021    2:38 PM 07/26/2021    9:31 AM  CMP  Glucose 70 - 99 mg/dL 12/24/2021  07/28/2021  89   BUN 8 - 23 mg/dL 18  20  18    Creatinine 0.44 - 1.00 mg/dL 416  606    Sodium 135 - 145 mmol/L 139  138  139   Potassium 3.5 - 5.1 mmol/L 3.7  3.9  3.6   Chloride 98 - 111 mmol/L 109  102  104   CO2 22 - 32 mmol/L 22  32  27   Calcium 8.9 - 10.3 mg/dL 3.01  6.01  9.9  Total Protein 6.5 - 8.1 g/dL 7.6     Total Bilirubin 0.3 - 1.2 mg/dL 0.6     Alkaline Phos 38 - 126 U/L 38     AST 15 - 41 U/L 39     ALT 0 - 44 U/L 32         Latest Ref Rng & Units 02/03/2022   10:06 PM 07/26/2021    9:31 AM 08/13/2020    1:21 PM  CBC  WBC 4.0 - 10.5 K/uL 8.8  9.2  9.0   Hemoglobin 12.0 - 15.0 g/dL 12.4  11.7  12.9   Hematocrit 36.0 - 46.0 % 38.7  37.4  40.4   Platelets 150 - 400 K/uL 265  192  247     Lipid Panel No results for input(s): "CHOL", "TRIG", "[place]", "VLDL", "HDL", "CHOLHDL", "LDLDIRECT" in the last 8760 hours.  HEMOGLOBIN A1C No results found for: "HGBA1C", "MPG" TSH Recent Labs    12/22/21 1438  TSH 1.19    External labs:     Radiology:    Cardiac Studies:   Echocardiogram 01/27/2022:   Normal LV systolic function with visual EF 60-65%.  Left ventricle cavity is normal in size. Mild concentric hypertrophy of the left ventricle. Normal global wall motion. Doppler evidence of grade I (impaired) diastolic dysfunction, normal LAP. Calculated EF 68%. Structurally normal mitral valve.  Mild to moderate mitral regurgitation. Structurally normal tricuspid valve.  Moderate tricuspid regurgitation. No evidence of pulmonary hypertension. Pericardium is normal. Trace pericardial effusion. There is no hemodynamic significance. No prior available for comparison.    Lexiscan Tetrofosmin stress test 01/27/2022: 1 Day Rest/Stress protocol.  Stress EKG is non-diagnostic due to target heart rate not achieved and 10/10 chest pain during stress.  Chest pain: Yes Hypertensive response to exercise: No, present at baseline (rest 190/80, peak 180/68). Normal myocardial perfusion without convincing evidence of reversible ischemia or prior infarct. Left ventricular size normal, wall thickness preserved, calculated LVEF 62%. No prior studies available for comparison Low risk study, clinical correlation required due to precordial pain during pharmacological stress and uncontrolled hypertension.    EKG:   12/27/2021: Sinus Rhythm with LAE and LVH. Nonspecific T-abnormality  Assessment     ICD-10-CM   1. Essential hypertension  I10     2. Mixed hyperlipidemia  E78.2        No orders of the defined types were placed in this encounter.   No orders of the defined types were placed in this encounter.   Medications Discontinued During This Encounter  Medication Reason   cephALEXin (KEFLEX) 250 MG capsule Completed Course   topiramate (TOPAMAX) 100 MG tablet Completed Course     Recommendations:   RUDENE POULSEN is a 77 y.o.  female with chest pain   Chest pain due to anxiety Echocardiogram and stress test both within normal limits Chest pain could also be from severely uncontrolled hypertension but patient states she is very  sensitive to BP meds, she could not tolerate losartan Patient will take extra toprol if she experiences heart racing   Essential hypertension Continue current cardiac medications. Stop losartan as she did not tolerate it Continue toprol, ok to take second pill if heart racing Encourage low-sodium diet, less than 2000 mg daily.   Mixed hyperlipidemia Will manage with diet and exercise as pt is very sensitive to medications Follow-up in 6 months or sooner if needed     Floydene Flock, DO, [facility]  02/08/2022, 11:08 AM Office: [phone] Pager:  [phone]  

## 2022-02-14 DIAGNOSIS — L258 Unspecified contact dermatitis due to other agents: Secondary | ICD-10-CM | POA: Diagnosis not present

## 2022-02-22 ENCOUNTER — Encounter: Payer: Self-pay | Admitting: Neurology

## 2022-02-22 ENCOUNTER — Encounter: Payer: Self-pay | Admitting: Internal Medicine

## 2022-02-22 NOTE — Telephone Encounter (Signed)
Please advise 

## 2022-02-22 NOTE — Telephone Encounter (Signed)
Ok discontinue

## 2022-02-23 DIAGNOSIS — M5416 Radiculopathy, lumbar region: Secondary | ICD-10-CM | POA: Diagnosis not present

## 2022-02-24 ENCOUNTER — Ambulatory Visit: Payer: PPO | Admitting: Neurology

## 2022-02-28 DIAGNOSIS — K5900 Constipation, unspecified: Secondary | ICD-10-CM | POA: Diagnosis not present

## 2022-02-28 DIAGNOSIS — R933 Abnormal findings on diagnostic imaging of other parts of digestive tract: Secondary | ICD-10-CM | POA: Diagnosis not present

## 2022-02-28 DIAGNOSIS — K219 Gastro-esophageal reflux disease without esophagitis: Secondary | ICD-10-CM | POA: Diagnosis not present

## 2022-03-04 ENCOUNTER — Other Ambulatory Visit (HOSPITAL_COMMUNITY): Payer: Self-pay

## 2022-03-07 DIAGNOSIS — L930 Discoid lupus erythematosus: Secondary | ICD-10-CM | POA: Diagnosis not present

## 2022-03-11 ENCOUNTER — Other Ambulatory Visit (HOSPITAL_COMMUNITY): Payer: Self-pay

## 2022-03-11 ENCOUNTER — Telehealth: Payer: Self-pay

## 2022-03-11 NOTE — Telephone Encounter (Signed)
PA request received via CMM for AJOVY (fremanezumab-vfrm) injection 225MG /1.5ML auto-injectors  PA has been submitted to [place] [employer] and is pending determination  Key: [id number]

## 2022-03-14 ENCOUNTER — Other Ambulatory Visit: Payer: Self-pay | Admitting: Neurology

## 2022-03-14 ENCOUNTER — Encounter: Payer: Self-pay | Admitting: Neurology

## 2022-03-15 MED ORDER — TIZANIDINE HCL 2 MG PO TABS: 4.0000 mg | ORAL_TABLET | Freq: Every day | ORAL | 5 refills | Status: DC

## 2022-03-15 MED ORDER — TIZANIDINE HCL 2 MG PO TABS: 4.0000 mg | ORAL_TABLET | Freq: Every day | ORAL | 2 refills | Status: DC

## 2022-03-15 NOTE — Addendum Note (Signed)
Addended by: Suzzanne Cloud on: 03/15/2022 04:13 PM   Modules accepted: Orders

## 2022-03-17 NOTE — Telephone Encounter (Signed)
error 

## 2022-03-17 NOTE — Telephone Encounter (Signed)
Patient Advocate Encounter  Received a fax from [place] [employer] regarding Prior Authorization for AJOVY (fremanezumab-vfrm) injection 225MG /1.5ML auto-injectors.   Authorization has been DENIED due to   Determination letter attached to patient file

## 2022-03-22 ENCOUNTER — Encounter (INDEPENDENT_AMBULATORY_CARE_PROVIDER_SITE_OTHER): Payer: PPO | Admitting: Neurology

## 2022-03-22 DIAGNOSIS — R519 Headache, unspecified: Secondary | ICD-10-CM

## 2022-03-22 DIAGNOSIS — G35 Multiple sclerosis: Secondary | ICD-10-CM | POA: Diagnosis not present

## 2022-03-22 DIAGNOSIS — G2581 Restless legs syndrome: Secondary | ICD-10-CM

## 2022-03-28 NOTE — Telephone Encounter (Signed)
  Found this information on the PA from cover my meds. Dx code is not a preferred diagnosis, we would need to update to a Migraine diagnosis.

## 2022-03-29 DIAGNOSIS — G43709 Chronic migraine without aura, not intractable, without status migrainosus: Secondary | ICD-10-CM | POA: Insufficient documentation

## 2022-04-04 DIAGNOSIS — L718 Other rosacea: Secondary | ICD-10-CM | POA: Diagnosis not present

## 2022-04-05 NOTE — Telephone Encounter (Signed)
Appeal letter for ajovy faxed to medicare

## 2022-04-06 ENCOUNTER — Other Ambulatory Visit: Payer: Self-pay | Admitting: Gastroenterology

## 2022-04-06 ENCOUNTER — Ambulatory Visit
Admission: RE | Admit: 2022-04-06 | Discharge: 2022-04-06 | Disposition: A | Discharge status: Home / Self Care | Payer: PPO | Source: Ambulatory Visit | Attending: Gastroenterology | Admitting: Gastroenterology

## 2022-04-06 DIAGNOSIS — K5900 Constipation, unspecified: Secondary | ICD-10-CM

## 2022-04-06 DIAGNOSIS — R197 Diarrhea, unspecified: Secondary | ICD-10-CM

## 2022-04-07 DIAGNOSIS — R197 Diarrhea, unspecified: Secondary | ICD-10-CM | POA: Diagnosis not present

## 2022-04-07 NOTE — Telephone Encounter (Signed)
Appeal letter approved for 02.29.24-12.31.2024 for ajovy. Request ID: [id number]

## 2022-04-12 NOTE — Telephone Encounter (Signed)
I called patient, she has constellation of complaints, complains of leg moving, difficulty sleeping, also frequent migraine headaches,  She has got Ajovy, but worried about $100 co-pay, has been on Horizant 600 mg every night for few years, also $100 co-pay, she decided to stop Horizant due to high cost, I advised her taking higher dose of gabapentin 300 up to 6 tablets every day for her restless leg symptoms,  Keep follow-up in April  Please see the MyChart message reply(ies) for my assessment and plan.    This patient gave consent for this Medical Advice Message and is aware that it may result in a bill to [employer], as well as the possibility of receiving a bill for a co-payment or deductible. They are an established patient, but are not seeking medical advice exclusively about a problem treated during an in person or video visit in the last seven days. I did not recommend an in person or video visit within seven days of my reply.    I spent a total of 7 minutes cumulative time within 7 days through [employer].  Marcial Pacas, MD

## 2022-04-14 ENCOUNTER — Other Ambulatory Visit: Payer: Self-pay | Admitting: Neurology

## 2022-04-18 ENCOUNTER — Other Ambulatory Visit (INDEPENDENT_AMBULATORY_CARE_PROVIDER_SITE_OTHER): Payer: PPO

## 2022-04-18 ENCOUNTER — Other Ambulatory Visit (HOSPITAL_COMMUNITY): Payer: Self-pay

## 2022-04-18 DIAGNOSIS — E2749 Other adrenocortical insufficiency: Secondary | ICD-10-CM

## 2022-04-18 LAB — BASIC METABOLIC PANEL
BUN: 20 mg/dL (ref 6–23)
CO2: 29 mEq/L (ref 19–32)
Calcium: 11.4 mg/dL — ABNORMAL HIGH (ref 8.4–10.5)
Chloride: 101 mEq/L (ref 96–112)
Creatinine, Ser: 1.05 mg/dL (ref 0.40–1.20)
GFR: 52.32 mL/min — ABNORMAL LOW (ref >60.00)
Glucose, Bld: 95 mg/dL (ref 70–99)
Potassium: 4.1 mEq/L (ref 3.5–5.1)
Sodium: 139 mEq/L (ref 135–145)

## 2022-04-20 ENCOUNTER — Encounter: Payer: Self-pay | Admitting: Endocrinology

## 2022-04-20 ENCOUNTER — Ambulatory Visit: Payer: PPO | Admitting: Endocrinology

## 2022-04-20 VITALS — BP 128/82 | HR 89 | Ht 60.0 in | Wt 114.0 lb

## 2022-04-20 DIAGNOSIS — I951 Orthostatic hypotension: Secondary | ICD-10-CM

## 2022-04-20 DIAGNOSIS — E213 Hyperparathyroidism, unspecified: Secondary | ICD-10-CM | POA: Diagnosis not present

## 2022-04-20 DIAGNOSIS — E2749 Other adrenocortical insufficiency: Secondary | ICD-10-CM

## 2022-04-20 MED ORDER — FLUDROCORTISONE ACETATE 0.1 MG PO TABS: ORAL_TABLET | ORAL | 0 refills | Status: DC

## 2022-04-20 MED ORDER — METHYLPREDNISOLONE 4 MG PO TABS: ORAL_TABLET | ORAL | 2 refills | Status: DC

## 2022-04-20 NOTE — Progress Notes (Signed)
Patient ID: Katrina Rivera, female   DOB: July 29, 1945, 77 y.o.   MRN: [id number]   Subjective:      Chief complaint: Endocrinology follow-up  PROBLEM 1: Dysautonomic orthostatic hypotension   PAST history: She has had long-standing problems with orthostatic hypotension and also hyponatremia. Has been diagnosed with dysautonomia and has multiple other problems related to autonomic neuropathy.   She has been on Florinef since 2004 previously taking 3 tablets daily along with 5 tablets of potassium which had previously controlled her symptoms well . Because of persistent orthostatic symptoms she had been tried on midodrine on 05/28/12 but this was later stopped when blood pressure increased.  She  had medication adjustments done frequently over the last year for regulating her blood pressure.  RECENT history:   She has  been on variable doses of Florinef long-term, as much as 3 a day previously,   Has for some time been on fludrocortisone 1 tablet 3 days a week  She is checking her blood pressure regularly with an Om[employer] meter, both sitting and standing and keeping the record that she brought In December her blood pressure readings have been as low as 71 systolic standing up However blood pressure readings in the last month or so have been somewhat variable with highest reading 0000000 systolic and lowest reading [id number] systolic, usually somewhat lower standing up  She will feel lightheaded if she has low normal blood pressure reading but usually transient  Potassium has been consistently normal without any supplements    Lab Results  Component Value Date   CREATININE 1.05 04/18/2022   BUN 20 04/18/2022   NA 139 04/18/2022   K 4.1 04/18/2022   CL 101 04/18/2022   CO2 29 04/18/2022     Adrenal Insufficiency:   This is secondary to pituitary dysfunction  Prior testing included Cortrosyn test showing stimulated level of 15.7, baseline 3.9.  Also confirmed by 24 hour urine  free cortisol which was only 3.0.  Although previously she had tolerated oral hydrocortisone and small doses with improvement in her symptoms she did not continue this long-term She was again symptomatic with weight loss, decreased appetite, nausea and also diarrhea Cortrosyn stimulation test on 03/12/14 showed baseline cortisol level of 0.3 and post injection of 1.1 only  She had GI side effects from hydrocortisone and prednisone and was in the past using hydrocortisone injections using insulin syringe   RECENT HISTORY:  Again doing well with methylprednisolone as directed twice a day without any decreased appetite, weakness or weight loss She does tend to have fatigue chronically She is taking 4 mg in the early morning and half tablet in the late afternoon very regularly Did not feel as well when she tried to reduce the dose previously  She knows to take stress doses for acute illnesses  Wt Readings from Last 3 Encounters:  04/20/22 114 lb (51.7 kg)  02/08/22 115 lb 6.4 oz (52.3 kg)  02/03/22 115 lb (52.2 kg)     General Endocrinology:   HYPERCALCEMIA:  She has had intermittent hypercalcemia since at least 2018, previously only temporarily associated with renal dysfunction Her PTH level was 31 previously and subsequently 23 and 38 1, 25 vitamin D level is normal  However her calcium is variable Blood calcium level was high at 11.2 when drawn by her osteoporosis physician  She is not on calcium supplements  She does have osteoporosis as of 2016, taking Prolia from orthopedic surgeon every 6 months She was  told by orthopedic surgeon to take vitamin D   Last bone density in 3/23 showed the following results BMD at the hip shows T score of -2.8, unchanged and was -1.60 at the spine   Lab Results  Component Value Date   PTH 38 06/01/2020   PTH Comment 06/01/2020   CALCIUM 11.4 (H) 04/18/2022   PHOS 1.9 (L) 07/23/2006     Allergies as of 04/20/2022       Reactions    Amoxicillin Other (See Comments)   Upset stomach   Aspirin    bruising   Azithromycin    stomach upset   Doxycycline Hyclate    GI upset   Tizanidine Other (See Comments)   Reported feeling jittery on this medication.   Buspirone Hcl Palpitations   Mirtazapine Palpitations   Weight gain        Medication List        Accurate as of April 20, 2022 10:09 AM. If you have any questions, ask your nurse or doctor.          acetaminophen 500 MG tablet Commonly known as: TYLENOL Take 1,000 mg by mouth daily as needed for moderate pain.   Ajovy 225 MG/1.5ML Soaj Generic drug: Fremanezumab-vfrm Inject 225 mg into the skin every 30 (thirty) days.   alfuzosin 10 MG 24 hr tablet Commonly known as: UROXATRAL Take 10 mg by mouth daily with breakfast.   cholecalciferol 25 MCG (1000 UNIT) tablet Commonly known as: VITAMIN D3 Take 1,000 Units by mouth daily.   CRANBERRY PO Take 1 tablet by mouth 2 (two) times daily.   dicyclomine 10 MG capsule Commonly known as: BENTYL Take 10 mg by mouth in the morning, at noon, and at bedtime.   DULoxetine 60 MG capsule Commonly known as: CYMBALTA Take 60 mg by mouth daily.   ferrous sulfate 325 (65 FE) MG tablet Take 325 mg by mouth daily.   fludrocortisone 0.1 MG tablet Commonly known as: FLORINEF Take 1 tablet daily   fluticasone 0.05 % cream Commonly known as: CUTIVATE Apply 1 application  topically 2 (two) times daily as needed for irritation.   fluticasone 50 MCG/ACT nasal spray Commonly known as: FLONASE Place 1 spray into both nostrils daily as needed for allergies or rhinitis.   gabapentin 300 MG capsule Commonly known as: NEURONTIN Take 2-3 capsules (600-900 mg total) by mouth at bedtime.   Horizant 600 MG Tbcr Generic drug: Gabapentin Enacarbil TAKE ONE TABLET BY MOUTH AT BEDTIME   hydrOXYzine 10 MG tablet Commonly known as: ATARAX Take 10-20 mg by mouth 2 (two) times daily as needed.   losartan 100 MG  tablet Commonly known as: COZAAR Take 1 tablet (100 mg total) by mouth daily.   methylPREDNISolone 4 MG tablet Commonly known as: MEDROL TAKE 1 TABLET ('4MG'$ ) AT BREAKFAST AND 1/2 TABLET ('2MG'$ ) AT 4PM.   metoprolol succinate 50 MG 24 hr tablet Commonly known as: TOPROL-XL Take 1 tablet (50 mg total) by mouth daily. Take with or immediately following a meal.   multivitamin tablet Take 1 tablet by mouth in the morning.   OVER THE COUNTER MEDICATION Take 1 capsule by mouth at bedtime. Sleep 3   polyethylene glycol 17 g packet Commonly known as: MiraLax Take 17 g by mouth 2 (two) times daily. 17 grams in 6 oz of favorite drink twice a day until bowel movement.  LAXITIVE.  Restart if two days since last bowel movement What changed:  when to take this reasons to  take this additional instructions   PROBIOTIC PO Take 1 capsule by mouth daily.   promethazine 25 MG tablet Commonly known as: PHENERGAN Take 25 mg by mouth every 6 (six) hours as needed for nausea or vomiting.   propranolol 10 MG tablet Commonly known as: INDERAL Take 10 mg by mouth 3 (three) times daily as needed.   SYSTANE OP Place 2 drops into both eyes daily as needed (for dry eyes).   tiZANidine 2 MG tablet Commonly known as: ZANAFLEX Take 2 tablets (4 mg total) by mouth at bedtime.   VITAMIN B COMPLEX PO Take 1 capsule by mouth in the morning.   VITAMIN C PO Take 1 tablet by mouth daily.        Review of Systems    Genitourinary: She has had difficulty emptying her bladder and is being treated by urologist   OSTEOPOROSIS: This is  being treated by her orthopedic surgeon with Prolia  Previous bone density in 2016 showed the following: AP LUMBAR SPINE L1 through L4 Young Adult T-Score:  -2.4 LEFT FEMUR NECK: Young Adult T-Score: -3.1  Last bone density in 3/23 showed stable to improved levels with lowest bone density -2.8  Previously: Bone density of 12/20 showed improvement in her T-scores up  to -2.7 at the right femur and - 3.6 at the radius  Vitamin D deficiency: She is on 1000 units vitamin D3   No recent history of thyroid disease:  Lab Results  Component Value Date   TSH 1.19 12/22/2021   TSH 2.21 08/21/2018   TSH 1.30 02/10/2017   FREET4 0.70 12/22/2021   FREET4 0.77 08/21/2018   FREET4 0.83 02/10/2017      Objective:   Physical Exam  BP (!) 176/86 (BP Location: Left Arm, Patient Position: Sitting, Cuff Size: Normal)   Pulse 89   Ht 5' (1.524 m)   Wt 114 lb (51.7 kg)   SpO2 98%   BMI 22.26 kg/m      Assessment:        Orthostatic dysautonomic hypotension syndrome:   Her orthostatic hypotension has been fairly well-controlled on the blood pressure fluctuates She has been taking 0.1 mg Florinef 3 days a week  Home readings are occasionally low normal but today relatively higher except standing up Symptoms are minimal  To see if she can get more even control of her blood pressure she will try taking 0.05 mg Florinef daily   Adrenal insufficiency:  She has long-standing secondary adrenal insufficiency with fairly typical symptoms at baseline and very low cortisol levels on the stimulation test Her symptoms are consistently well controlled on methylprednisolone 4 mg in the morning and 2 mg in the evening She will continue the same dosage  Hypercalcemia: Calcium is only minimally high and she has not had any worsening of her osteoporosis  Current GFR is 52 but this is adequate for her age and this was explained Unlikely she has any underlying renal disease   Plan:       No change in methylprednisolone regimen for adrenal insufficiency As above change of Florinef dose and let us know if her blood pressure is consistently high or low Periodically monitor calcium and consider parathyroid scan only if osteoporosis is worsening or calcium will worsen 11.5  Bayley [place]  Note: This office note was prepared with Teacher, early years/pre. Any transcriptional errors that result from this process are unintentional.

## 2022-04-20 NOTE — Patient Instructions (Signed)
Take 1/2 Florinef daily

## 2022-05-05 DIAGNOSIS — M5416 Radiculopathy, lumbar region: Secondary | ICD-10-CM | POA: Diagnosis not present

## 2022-05-12 ENCOUNTER — Other Ambulatory Visit: Payer: Self-pay | Admitting: Student

## 2022-05-12 DIAGNOSIS — M5416 Radiculopathy, lumbar region: Secondary | ICD-10-CM

## 2022-05-16 ENCOUNTER — Ambulatory Visit: Payer: PPO | Admitting: Neurology

## 2022-05-17 ENCOUNTER — Encounter: Payer: Self-pay | Admitting: Neurology

## 2022-05-17 ENCOUNTER — Ambulatory Visit: Payer: PPO | Admitting: Neurology

## 2022-05-17 VITALS — BP 169/91 | HR 104 | Ht 60.0 in | Wt 112.5 lb

## 2022-05-17 DIAGNOSIS — G43709 Chronic migraine without aura, not intractable, without status migrainosus: Secondary | ICD-10-CM | POA: Diagnosis not present

## 2022-05-17 DIAGNOSIS — Z981 Arthrodesis status: Secondary | ICD-10-CM | POA: Diagnosis not present

## 2022-05-17 DIAGNOSIS — G35 Multiple sclerosis: Secondary | ICD-10-CM

## 2022-05-17 DIAGNOSIS — G2581 Restless legs syndrome: Secondary | ICD-10-CM

## 2022-05-17 MED ORDER — GABAPENTIN 300 MG PO CAPS: 600.0000 mg | ORAL_CAPSULE | Freq: Every day | ORAL | 3 refills | Status: DC

## 2022-05-17 MED ORDER — CLONAZEPAM 0.5 MG PO TABS: 0.5000 mg | ORAL_TABLET | Freq: Every evening | ORAL | 1 refills | Status: DC | PRN

## 2022-05-17 MED ORDER — DULOXETINE HCL 60 MG PO CPEP: 60.0000 mg | ORAL_CAPSULE | Freq: Every day | ORAL | 3 refills | Status: DC

## 2022-05-17 NOTE — Progress Notes (Signed)
ASSESSMENT AND PLAN 77 y.o. year old female   Possible relapsing remitting multiple sclerosis Gait abnormality Restless leg syndrome Worsening low back pain  MRI of the brain in October 2023 showed multiple T2/FLAIR hyperintensity signal, consistent with small vessel disease versus possible multiple sclerosis compared to previous scan in 2015, only 2 subcentimeter new lesion, no contrast-enhancement, less likely cause any symptomatic worsening New onset headaches  Normal ESR C-reactive protein,  With migraine features, does improve with Ajovy as preventive medications,   NSAIDs as needed  Worsening anxiety  Urged her to work with her primary care for better control,  Clonazepam 0.5 mg half to 1 tablets as needed at nighttime  Return To Clinic With NP In 12 Months   DIAGNOSTIC DATA (LABS, IMAGING, TESTING) - I reviewed patient records, labs, notes, testing and imaging myself where available.   HISTORY  [name] is a very pleasant 77 year-old right-handed woman, with relapsing remitting multiple sclerosis, was treated with Betaseron 4 -5 years, but could not tolerate the side effect, now is not on any immunomodulation therapy. neuropathic pain of bilateral lower extremities.   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    She has baseline gait difficulty, she fell in May 2013, fractured both elbows and her left knee cap.  She intermittently has been using a cane or walker.  She could not tolerate  Requip because of nausea. She has been on gabapentin 300 mg 3 bid. She gained significant weight gain when on Lyrica, and therefore was switched back to gabapentin. She has orthostatic hypotension, and is on midodrine 2.5 mg and fludrocortisone 0.1 mg 1 in AM and 2 at night, and she is also on 20 mg of hydrocortisone.   She complains of bilateral lower extremity burning stinging sensation, getting worse when she sits still, difficulty falling into sleep, she has the urge to move because of bilateral extremity discomfort, has tried Requip, could not tolerate it because of GI side effects, Neuprol patch does not work either, she also tried Elavil, Cymbalta in the past, could not tolerate it due to side effect.   UPDATE Nov 17 2020: She is accompanied by her husband at today's visit, just had right knee replacement in July 2022, finishing up physical therapy, continue has gait abnormality, wearing ankle brace now, seems to help her some  She continue to take Horizant 600 mg at bedtime, gabapentin 100 mg 1 to 2 tablets every night as needed  She complains of intermittent diffuse body achy sensation,  UPDATE May 17 2022: She is companied by her husband, constellation of complaints, worsening anxiety, difficulty with memory, difficulty sleeping, worsening low back pain, is under the care of neurosurgeon [name], planning on CT myelogram, previous lumbar decompression surgery twice,  She is already on polypharmacy treatment, including gabapentin 300 mg 2 to 3 tablets at bedtime, horizant 600 mg every night, Cymbalta 60 mg daily,  Her headache is under better control, tolerating Ajovy monthly  Personally reviewed MRI of the brain in October 2023, no acute abnormality, multiple T2/FLAIR signal  abnormality, small vessel disease, versus possible multiple sclerosis, compared to previous study in 2015, there was 2 new foci of white matter and right subcortical region, no contrast-enhancement   REVIEW  OF SYSTEMS: Out of a complete 14 system review of symptoms, the patient complains only of the following symptoms, and all other reviewed systems are negative.  See HPI  PHYSICAL EXAM  Vitals:   05/17/22 1320  BP: (!) 169/91  Pulse: (!) 104  Weight: 112 lb 8 oz (51 kg)  Height: 5' (1.524 m)   Body mass index is 21.97 kg/m.  PHYSICAL EXAMNIATION:  Gen: NAD, conversant, well nourised, well groomed                     Cardiovascular: Regular rate rhythm, no peripheral edema, warm, nontender. Eyes: Conjunctivae clear without exudates or hemorrhage Neck: Supple, no carotid bruits. Pulmonary: Clear to auscultation bilaterally   NEUROLOGICAL EXAM:  MENTAL STATUS: Speech/cognition: Awake, alert oriented to history taking and casual conversation  CRANIAL NERVES: CN II: Visual fields are full to confrontation.  Pupils are round equal and briskly reactive to light. CN III, IV, VI: extraocular movement are normal. No ptosis. CN V: Facial sensation is intact to pinprick in all 3 divisions bilaterally. Corneal responses are intact.  CN VII: Face is symmetric with normal eye closure and smile. CN VIII: Hearing is normal to casual conversation CN IX, X: Palate elevates symmetrically. Phonation is normal. CN XI: Head turning and shoulder shrug are intact CN XII: Tongue is midline with normal movements and no atrophy.  MOTOR: No significant muscle weakness  REFLEXES: Reflexes are 2+ and symmetric at the biceps, triceps, knees (Right3/L2) and ankles. Plantar responses are flexor.  SENSORY: Intact to light touch, pinprick, positional and vibratory sensation are intact in fingers and toes.  COORDINATION: Rapid alternating movements and fine finger movements are intact. There is no dysmetria on finger-to-nose and heel-knee-shin.    GAIT/STANCE: Need push-up to get up from seated position, bilateral knee valgus, stiff, cautious    ALLERGIES: Allergies  Allergen Reactions    Amoxicillin Other (See Comments)    Upset stomach   Aspirin     bruising   Azithromycin     stomach upset   Doxycycline Hyclate     GI upset   Tizanidine Other (See Comments)    Reported feeling jittery on this medication.   Buspirone Hcl Palpitations   Mirtazapine Palpitations    Weight gain    HOME MEDICATIONS: Outpatient Medications Prior to Visit  Medication Sig Dispense Refill   acetaminophen (TYLENOL) 500 MG tablet Take 1,000 mg by mouth daily as needed for moderate pain.     alfuzosin (UROXATRAL) 10 MG 24 hr tablet Take 10 mg by mouth daily with breakfast.     Ascorbic Acid (VITAMIN C PO) Take 1 tablet by mouth daily.     B Complex Vitamins (VITAMIN B COMPLEX PO) Take 1 capsule by mouth in the morning.     cholecalciferol (VITAMIN D3) 25 MCG (1000 UNIT) tablet Take 1,000 Units by mouth daily.     CRANBERRY PO Take 1 tablet by mouth 2 (two) times daily.     dicyclomine (BENTYL) 10 MG capsule Take 10 mg by mouth in the morning, at noon, and at bedtime.     DULoxetine (CYMBALTA) 60 MG capsule Take 1 capsule (60 mg total) by mouth daily. 90 capsule 0   ferrous sulfate 325 (65 FE) MG tablet Take 325 mg by mouth  daily.     fludrocortisone (FLORINEF) 0.1 MG tablet Take 1 tablet daily 90 tablet 0   fluticasone (CUTIVATE) 0.05 % cream Apply 1 application  topically 2 (two) times daily as needed for irritation.     fluticasone (FLONASE) 50 MCG/ACT nasal spray Place 1 spray into both nostrils daily as needed for allergies or rhinitis.     Fremanezumab-vfrm (AJOVY) 225 MG/1.5ML SOAJ Inject 225 mg into the skin every 30 (thirty) days. 1.68 mL 11   gabapentin (NEURONTIN) 300 MG capsule Take 2-3 capsules (600-900 mg total) by mouth at bedtime. 270 capsule 1   HORIZANT 600 MG TBCR TAKE ONE TABLET BY MOUTH AT BEDTIME 90 tablet 4   methylPREDNISolone (MEDROL) 4 MG tablet TAKE 1 TABLET (4MG ) AT BREAKFAST AND 1/2 TABLET (2MG ) AT 4PM. 135 tablet 2   Multiple Vitamin (MULTIVITAMIN) tablet Take 1  tablet by mouth in the morning.     OVER THE COUNTER MEDICATION Take 1 capsule by mouth at bedtime. Sleep 3     Polyethyl Glycol-Propyl Glycol (SYSTANE OP) Place 2 drops into both eyes daily as needed (for dry eyes).     polyethylene glycol (MIRALAX) 17 g packet Take 17 g by mouth 2 (two) times daily. 17 grams in 6 oz of favorite drink twice a day until bowel movement.  LAXITIVE.  Restart if two days since last bowel movement (Patient taking differently: Take 17 g by mouth daily as needed for moderate constipation.) 14 packet 0   Probiotic Product (PROBIOTIC PO) Take 1 capsule by mouth daily.     promethazine (PHENERGAN) 25 MG tablet Take 25 mg by mouth every 6 (six) hours as needed for nausea or vomiting.     tiZANidine (ZANAFLEX) 2 MG tablet Take 2 tablets (4 mg total) by mouth at bedtime. 60 tablet 5   hydrOXYzine (ATARAX) 10 MG tablet Take 10-20 mg by mouth 2 (two) times daily as needed.     losartan (COZAAR) 100 MG tablet Take 1 tablet (100 mg total) by mouth daily. (Patient not taking: Reported on 02/08/2022) 90 tablet 3   metoprolol succinate (TOPROL-XL) 50 MG 24 hr tablet Take 1 tablet (50 mg total) by mouth daily. Take with or immediately following a meal. (Patient not taking: Reported on 02/08/2022) 30 tablet 2   propranolol (INDERAL) 10 MG tablet Take 10 mg by mouth 3 (three) times daily as needed.     No facility-administered medications prior to visit.    PAST MEDICAL HISTORY: Past Medical History:  Diagnosis Date   Addison's disease    takes Solu Cortef daily   Anemia    takes Ferrous Sulfate daily   Anxiety    takes Xanax nightly   Arthritis    Chronic back pain    stenosis   Depression    takes Cymbalta daily   Dizziness    if b/p drops    Fibromyalgia    Fibromyalgia    History of blood transfusion    no abnormal reaction noted   History of bronchitis    many yrs ago    Hypokalemia    takes Potassium daily   Hypotension    Hypotension    takes Florinef daily    IBS (irritable bowel syndrome)    takes Align daily   Insomnia    takes Trazodone nightly   Joint pain    Multiple sclerosis    doesn't take any meds   Multiple sclerosis    New onset headache 12/22/2021  Nocturia    Osteoporosis    Peripheral neuropathy    Primary localized osteoarthritis of left knee 08/11/2020   Restless leg syndrome    Seasonal allergies    takes Zyrtec daily;uses Flonase daily as needed   Syncope    Urinary frequency    takes Flomax daily   Weakness    numbness and tingling    PAST SURGICAL HISTORY: Past Surgical History:  Procedure Laterality Date   ABDOMINAL HYSTERECTOMY  02/07/1986   APPENDECTOMY  02/07/1986   CESAREAN SECTION  1973/1977   x2   CHOLECYSTECTOMY  02/08/1995   COLECTOMY  02/08/1988   COLONOSCOPY     ESOPHAGOGASTRODUODENOSCOPY     EYE SURGERY     bilateral - /w IOL   FRACTURE SURGERY Right    rods and screws-right leg   LUMBAR LAMINECTOMY/DECOMPRESSION MICRODISCECTOMY Left 01/24/2013   Procedure: LUMBAR FIVE TO SACRAL ONE LUMBAR LAMINECTOMY/DECOMPRESSION MICRODISCECTOMY 1 LEVEL;  Surgeon: Tia Alert, MD;  Location: [facility];  Service: Neurosurgery;  Laterality: Left;   MAXIMUM ACCESS (MAS)POSTERIOR LUMBAR INTERBODY FUSION (PLIF) 1 LEVEL N/A 06/18/2014   Procedure: MAXIMUM ACCESS SURGERY POSTERIOR LUMBAR INTERBODY FUSION LUMBAR FIVE TO SACRAL ONE ;  Surgeon: Tia Alert, MD;  Location: [facility];  Service: Neurosurgery;  Laterality: N/A;   SPINAL CORD STIMULATOR BATTERY EXCHANGE Right 07/30/2021   Procedure: Spinal cord stimulator battery replacement;  Surgeon: Tia Alert, MD;  Location: [facility] OR;  Service: Neurosurgery;  Laterality: Right;   SPINAL CORD STIMULATOR IMPLANT     TOTAL KNEE ARTHROPLASTY Left 08/24/2020   Procedure: TOTAL KNEE ARTHROPLASTY;  Surgeon: Salvatore Marvel, MD;  Location: [facility];  Service: Orthopedics;  Laterality: Left;    FAMILY HISTORY: Family History  Problem Relation Age of Onset    Hypertension Mother    Stroke Mother    Heart attack Father    Tremor Brother     SOCIAL HISTORY: Social History   Socioeconomic History   Marital status: Married    Spouse name: [name]   Number of children: 2   Years of education: 12   Highest education level: Not on file  Occupational History    Employer: DISABLED    Comment: Disabled  Tobacco Use   Smoking status: Never   Smokeless tobacco: Never  Vaping Use   Vaping Use: Never used  Substance and Sexual Activity   Alcohol use: Never   Drug use: Never   Sexual activity: Not on file  Other Topics Concern   Not on file  Social History Narrative   Pt lives at home with spouse. Gabriel Rung) [phone] (patient's cell)   [name]'s cell  [phone]      Caffeine Use: 1 cups daily.Right handed.Disabled.Education - high schoolPatient has two adult children.   Social Determinants of Health   Financial Resource Strain: Not on file  Food Insecurity: Not on file  Transportation Needs: Not on file  Physical Activity: Not on file  Stress: Not on file  Social Connections: Not on file  Intimate Partner Violence: Not on file     Levert Feinstein, M.D. Ph.D.  [facility] [address] [place], [place] [address] Phone: [phone] Fax:      [phone]

## 2022-05-19 ENCOUNTER — Encounter: Payer: Self-pay | Admitting: Neurology

## 2022-05-24 ENCOUNTER — Ambulatory Visit
Admission: RE | Admit: 2022-05-24 | Discharge: 2022-05-24 | Disposition: A | Discharge status: Home / Self Care | Payer: PPO | Source: Ambulatory Visit | Attending: Student | Admitting: Student

## 2022-05-24 DIAGNOSIS — M5416 Radiculopathy, lumbar region: Secondary | ICD-10-CM

## 2022-05-24 DIAGNOSIS — M5116 Intervertebral disc disorders with radiculopathy, lumbar region: Secondary | ICD-10-CM | POA: Diagnosis not present

## 2022-05-24 DIAGNOSIS — M48061 Spinal stenosis, lumbar region without neurogenic claudication: Secondary | ICD-10-CM | POA: Diagnosis not present

## 2022-05-24 MED ORDER — IOPAMIDOL (ISOVUE-M 200) INJECTION 41%
20.0000 mL | Freq: Once | INTRAMUSCULAR | Status: AC
  Administered 2022-05-24: 20 mL via INTRATHECAL

## 2022-05-24 MED ORDER — ONDANSETRON HCL 4 MG/2ML IJ SOLN: 4.0000 mg | Freq: Once | INTRAMUSCULAR | Status: DC | PRN

## 2022-05-24 MED ORDER — MEPERIDINE HCL 50 MG/ML IJ SOLN: 50.0000 mg | Freq: Once | INTRAMUSCULAR | Status: DC | PRN

## 2022-05-24 MED ORDER — DIAZEPAM 5 MG PO TABS
5.0000 mg | ORAL_TABLET | Freq: Once | ORAL | Status: AC
  Administered 2022-05-24: 5 mg via ORAL

## 2022-05-24 NOTE — Progress Notes (Signed)
Pt has spinal cord stimulator and reports she has turned it off for her CT myelogram procedure.  

## 2022-05-24 NOTE — Discharge Instructions (Signed)

## 2022-05-31 ENCOUNTER — Encounter: Payer: Self-pay | Admitting: Neurology

## 2022-05-31 DIAGNOSIS — M5416 Radiculopathy, lumbar region: Secondary | ICD-10-CM | POA: Diagnosis not present

## 2022-06-03 ENCOUNTER — Other Ambulatory Visit: Payer: Self-pay | Admitting: Neurological Surgery

## 2022-06-05 ENCOUNTER — Encounter (INDEPENDENT_AMBULATORY_CARE_PROVIDER_SITE_OTHER): Payer: PPO | Admitting: Neurology

## 2022-06-05 ENCOUNTER — Encounter: Payer: Self-pay | Admitting: Endocrinology

## 2022-06-05 DIAGNOSIS — G35 Multiple sclerosis: Secondary | ICD-10-CM

## 2022-06-05 DIAGNOSIS — G43709 Chronic migraine without aura, not intractable, without status migrainosus: Secondary | ICD-10-CM

## 2022-06-07 DIAGNOSIS — G35 Multiple sclerosis: Secondary | ICD-10-CM | POA: Diagnosis not present

## 2022-06-07 DIAGNOSIS — G259 Extrapyramidal and movement disorder, unspecified: Secondary | ICD-10-CM | POA: Diagnosis not present

## 2022-06-07 DIAGNOSIS — Z0000 Encounter for general adult medical examination without abnormal findings: Secondary | ICD-10-CM | POA: Diagnosis not present

## 2022-06-07 DIAGNOSIS — I700 Atherosclerosis of aorta: Secondary | ICD-10-CM | POA: Diagnosis not present

## 2022-06-07 DIAGNOSIS — E2740 Unspecified adrenocortical insufficiency: Secondary | ICD-10-CM | POA: Diagnosis not present

## 2022-06-07 DIAGNOSIS — F419 Anxiety disorder, unspecified: Secondary | ICD-10-CM | POA: Diagnosis not present

## 2022-06-07 DIAGNOSIS — M797 Fibromyalgia: Secondary | ICD-10-CM | POA: Diagnosis not present

## 2022-06-07 DIAGNOSIS — M810 Age-related osteoporosis without current pathological fracture: Secondary | ICD-10-CM | POA: Diagnosis not present

## 2022-06-07 DIAGNOSIS — F5101 Primary insomnia: Secondary | ICD-10-CM | POA: Diagnosis not present

## 2022-06-07 DIAGNOSIS — N183 Chronic kidney disease, stage 3 unspecified: Secondary | ICD-10-CM | POA: Diagnosis not present

## 2022-06-07 MED ORDER — NURTEC 75 MG PO TBDP: ORAL_TABLET | ORAL | 5 refills | Status: DC

## 2022-06-07 MED ORDER — AIMOVIG 70 MG/ML ~~LOC~~ SOAJ: 70.0000 mg | SUBCUTANEOUS | 11 refills | Status: DC

## 2022-06-07 NOTE — Telephone Encounter (Signed)

## 2022-06-08 NOTE — Progress Notes (Signed)
Surgical Instructions    Your procedure is scheduled on Friday May 10.  Report to [facility] Main Entrance "A" at 11:28 A.M., then check in with the Admitting office.  Call this number if you have problems the morning of surgery:  [phone]   If you have any questions prior to your surgery date call [phone]: Open Monday-Friday 8am-4pm If you experience any cold or flu symptoms such as cough, fever, chills, shortness of breath, etc. between now and your scheduled surgery, please notify us at the above number     Remember:  Do not eat or drink anything after midnight the night before your surgery    Take these medicines the morning of surgery with A SIP OF WATER:  acetaminophen (TYLENOL) if needed alfuzosin (UROXATRAL)  cephALEXin (KEFLEX)  dicyclomine (BENTYL)  DULoxetine (CYMBALTA)  fludrocortisone (FLORINEF)  fluticasone (FLONASE) if needed  As of today, STOP taking any Aspirin (unless otherwise instructed by your surgeon) Aleve, Naproxen, Ibuprofen, Motrin, Advil, Goody's, BC's, all herbal medications, fish oil, and all vitamins.           Do not wear jewelry or makeup. Do not wear lotions, powders, perfumes/cologne or deodorant. Do not shave 48 hours prior to surgery.  Men may shave face and neck. Do not bring valuables to the hospital. Do not wear nail polish, gel polish, artificial nails, or any other type of covering on natural nails (fingers and toes) If you have artificial nails or gel coating that need to be removed by a nail salon, please have this removed prior to surgery. Artificial nails or gel coating may interfere with anesthesia's ability to adequately monitor your vital signs.  [facility] is not responsible for any belongings or valuables.    Do NOT Smoke (Tobacco/Vaping)  24 hours prior to your procedure  If you use a CPAP at night, you may bring your mask for your overnight stay.   Contacts, glasses, hearing aids, dentures or partials may not be  worn into surgery, please bring cases for these belongings   For patients admitted to the hospital, discharge time will be determined by your treatment team.   Patients discharged the day of surgery will not be allowed to drive home, and someone needs to stay with them for 24 hours.   SURGICAL WAITING ROOM VISITATION Patients having surgery or a procedure may have no more than 2 support people in the waiting area - these visitors may rotate.   Children under the age of 54 must have an adult with them who is not the patient. If the patient needs to stay at the hospital during part of their recovery, the visitor guidelines for inpatient rooms apply. Pre-op nurse will coordinate an appropriate time for 1 support person to accompany patient in pre-op.  This support person may not rotate.   Please refer to [email or web address] for the visitor guidelines for Inpatients (after your surgery is over and you are in a regular room).       Day of Surgery:  Take a shower with CHG soap. Wear Clean/Comfortable clothing the morning of surgery Do not apply any deodorants/lotions.   Remember to brush your teeth WITH YOUR REGULAR TOOTHPASTE.    If you received a COVID test during your pre-op visit, it is requested that you wear a mask when out in public, stay away from anyone that may not be feeling well, and notify your surgeon if you develop symptoms. If you have been in contact with anyone that has  tested positive in the last 10 days, please notify your surgeon.    Please read over the following fact sheets that you were given.

## 2022-06-09 ENCOUNTER — Other Ambulatory Visit: Payer: Self-pay

## 2022-06-09 ENCOUNTER — Encounter (HOSPITAL_COMMUNITY)
Admission: RE | Admit: 2022-06-09 | Discharge: 2022-06-09 | Disposition: A | Discharge status: Home / Self Care | Payer: PPO | Source: Ambulatory Visit | Attending: Neurological Surgery | Admitting: Neurological Surgery

## 2022-06-09 ENCOUNTER — Encounter (HOSPITAL_COMMUNITY): Payer: Self-pay

## 2022-06-09 VITALS — BP 140/64 | HR 75 | Temp 97.7°F | Resp 16 | Ht 60.0 in | Wt 114.4 lb

## 2022-06-09 DIAGNOSIS — G2581 Restless legs syndrome: Secondary | ICD-10-CM | POA: Diagnosis not present

## 2022-06-09 DIAGNOSIS — K589 Irritable bowel syndrome without diarrhea: Secondary | ICD-10-CM | POA: Insufficient documentation

## 2022-06-09 DIAGNOSIS — M797 Fibromyalgia: Secondary | ICD-10-CM | POA: Insufficient documentation

## 2022-06-09 DIAGNOSIS — R002 Palpitations: Secondary | ICD-10-CM | POA: Insufficient documentation

## 2022-06-09 DIAGNOSIS — E2740 Unspecified adrenocortical insufficiency: Secondary | ICD-10-CM | POA: Insufficient documentation

## 2022-06-09 DIAGNOSIS — G988 Other disorders of nervous system: Secondary | ICD-10-CM

## 2022-06-09 DIAGNOSIS — I959 Hypotension, unspecified: Secondary | ICD-10-CM | POA: Insufficient documentation

## 2022-06-09 DIAGNOSIS — M5126 Other intervertebral disc displacement, lumbar region: Secondary | ICD-10-CM | POA: Insufficient documentation

## 2022-06-09 DIAGNOSIS — D649 Anemia, unspecified: Secondary | ICD-10-CM | POA: Diagnosis not present

## 2022-06-09 DIAGNOSIS — K219 Gastro-esophageal reflux disease without esophagitis: Secondary | ICD-10-CM | POA: Insufficient documentation

## 2022-06-09 DIAGNOSIS — Z9049 Acquired absence of other specified parts of digestive tract: Secondary | ICD-10-CM | POA: Insufficient documentation

## 2022-06-09 DIAGNOSIS — G35 Multiple sclerosis: Secondary | ICD-10-CM | POA: Diagnosis not present

## 2022-06-09 DIAGNOSIS — Z01812 Encounter for preprocedural laboratory examination: Secondary | ICD-10-CM | POA: Insufficient documentation

## 2022-06-09 DIAGNOSIS — Z01818 Encounter for other preprocedural examination: Secondary | ICD-10-CM

## 2022-06-09 HISTORY — DX: Palpitations: R00.2

## 2022-06-09 LAB — SURGICAL PCR SCREEN
MRSA, PCR: NEGATIVE
Staphylococcus aureus: NEGATIVE

## 2022-06-09 LAB — CBC
HCT: 40.9 % (ref 36.0–46.0)
Hemoglobin: 13.3 g/dL (ref 12.0–15.0)
MCH: 33.1 pg (ref 26.0–34.0)
MCHC: 32.5 g/dL (ref 30.0–36.0)
MCV: 101.7 fL — ABNORMAL HIGH (ref 80.0–100.0)
Platelets: 222 10*3/uL (ref 150–400)
RBC: 4.02 MIL/uL (ref 3.87–5.11)
RDW: 12.8 % (ref 11.5–15.5)
WBC: 7.9 10*3/uL (ref 4.0–10.5)
nRBC: 0 % (ref 0.0–0.2)

## 2022-06-09 LAB — BASIC METABOLIC PANEL
Anion gap: 12 (ref 5–15)
BUN: 25 mg/dL — ABNORMAL HIGH (ref 8–23)
CO2: 29 mmol/L (ref 22–32)
Calcium: 10.6 mg/dL — ABNORMAL HIGH (ref 8.9–10.3)
Chloride: 100 mmol/L (ref 98–111)
Creatinine, Ser: 1.05 mg/dL — ABNORMAL HIGH (ref 0.44–1.00)
GFR, Estimated: 56 mL/min — ABNORMAL LOW (ref >60)
Glucose, Bld: 88 mg/dL (ref 70–99)
Potassium: 4.4 mmol/L (ref 3.5–5.1)
Sodium: 141 mmol/L (ref 135–145)

## 2022-06-09 LAB — PROTIME-INR
INR: 0.9 (ref 0.8–1.2)
Prothrombin Time: 11.5 seconds (ref 11.4–15.2)

## 2022-06-09 LAB — TYPE AND SCREEN
ABO/RH(D): A POS
Antibody Screen: NEGATIVE

## 2022-06-09 NOTE — Progress Notes (Signed)
PCP - [name] Cardiologist - Custovic, Rozell Searing Neurologist- Dr. Levert Feinstein Endocrinologist- Dr. Reather Littler- pt repots she was taking half dose of Florinef, but was told by MD to stop taking. Pt reports her last dose was 06/08/22.   PPM/ICD - denies   Chest x-ray - 04/05/21 EKG - 12/27/21 Stress Test - 01/30/22 ECHO - 01/27/22 Cardiac Cath - denies  Sleep Study - denies   Fasting Blood Sugar - N/A  Last dose of GLP1 agonist-  N/A  Blood Thinner Instructions: N/A Aspirin Instructions:n/A  ERAS Protcol - NPO order   COVID TEST- N/A  Pt reports history of syncope in the past d/t not taking methylprednisolone that day. Pt denies any recent syncope, chest pain, dizziness, but states that she does occasionally get palpitations and was started back on her propranolol yesterday. Pt states she is taking this twice a day, and can take a third dose if needed.   Anesthesia review: medical history- MS, Addison's disease  Patient denies shortness of breath, fever, cough and chest pain at PAT appointment   All instructions explained to the patient, with a verbal understanding of the material. Patient agrees to go over the instructions while at home for a better understanding. The opportunity to ask questions was provided.

## 2022-06-09 NOTE — Progress Notes (Addendum)
Surgical Instructions    Your procedure is scheduled on Friday May 10.  Report to [facility] Main Entrance "A" at 11:28 A.M., then check in with the Admitting office.  Call this number if you have problems the morning of surgery:  [phone]   If you have any questions prior to your surgery date call [phone]: Open Monday-Friday 8am-4pm If you experience any cold or flu symptoms such as cough, fever, chills, shortness of breath, etc. between now and your scheduled surgery, please notify us at the above number     Remember:  Do not eat or drink anything after midnight the night before your surgery    Take these medicines the morning of surgery with A SIP OF WATER:  acetaminophen (TYLENOL) if needed alfuzosin (UROXATRAL)  dicyclomine (BENTYL)  DULoxetine (CYMBALTA)  methylPREDNISolone (MEDROL)  fluticasone (FLONASE) if needed promethazine (PHENERGAN) if needed Propranolol  As of today, STOP taking any Aspirin (unless otherwise instructed by your surgeon) Aleve, Naproxen, Ibuprofen, Motrin, Advil, Goody's, BC's, all herbal medications, fish oil, and all vitamins.           [facility] is not responsible for any belongings or valuables.    Do NOT Smoke (Tobacco/Vaping)  24 hours prior to your procedure  If you use a CPAP at night, you may bring your mask for your overnight stay.   Contacts, glasses, hearing aids, dentures or partials may not be worn into surgery, please bring cases for these belongings   For patients admitted to the hospital, discharge time will be determined by your treatment team.   Patients discharged the day of surgery will not be allowed to drive home, and someone needs to stay with them for 24 hours.   SURGICAL WAITING ROOM VISITATION Patients having surgery or a procedure may have no more than 2 support people in the waiting area - these visitors may rotate.   Children under the age of 98 must have an adult with them who is not the patient. If the  patient needs to stay at the hospital during part of their recovery, the visitor guidelines for inpatient rooms apply. Pre-op nurse will coordinate an appropriate time for 1 support person to accompany patient in pre-op.  This support person may not rotate.   Please refer to [email or web address] for the visitor guidelines for Inpatients (after your surgery is over and you are in a regular room).       Day of Surgery:  Take a shower with CHG soap. Wear Clean/Comfortable clothing the morning of surgery  Do not wear jewelry or makeup. Do not wear lotions, powders, perfumes/cologne or deodorant. Do not shave 48 hours prior to surgery.  Men may shave face and neck. Do not bring valuables to the hospital. Do not wear nail polish, gel polish, artificial nails, or any other type of covering on natural nails (fingers and toes) If you have artificial nails or gel coating that need to be removed by a nail salon, please have this removed prior to surgery. Artificial nails or gel coating may interfere with anesthesia's ability to adequately monitor your vital signs..   Remember to brush your teeth WITH YOUR REGULAR TOOTHPASTE.    If you received a COVID test during your pre-op visit, it is requested that you wear a mask when out in public, stay away from anyone that may not be feeling well, and notify your surgeon if you develop symptoms. If you have been in contact with anyone that has tested positive in  the last 10 days, please notify your surgeon.    Please read over the following fact sheets that you were given.

## 2022-06-10 NOTE — Anesthesia Preprocedure Evaluation (Addendum)
Anesthesia Evaluation  Patient identified by MRN, date of birth, ID band Patient awake    Reviewed: Allergy & Precautions, NPO status , Patient's Chart, lab work & pertinent test results  Airway Mallampati: II  TM Distance: <3 FB Neck ROM: Full    Dental  (+) Teeth Intact, Dental Advisory Given   Pulmonary neg pulmonary ROS   breath sounds clear to auscultation       Cardiovascular hypertension,  Rhythm:Regular Rate:Normal  Echocardiogram 01/27/2022:  Normal LV systolic function with visual EF 60-65%. Left ventricle cavity is normal in size. Mild concentric hypertrophy of the left ventricle. Normal global wall motion. Doppler evidence of grade I (impaired) diastolic dysfunction, normal LAP. Calculated EF 68%. Structurally normal mitral valve. Mild to moderate mitral regurgitation. Structurally normal tricuspid valve. Moderate tricuspid regurgitation. No evidence of pulmonary hypertension. Pericardium is normal. Trace pericardial effusion. There is no hemodynamic significance. No prior available for comparison.   Lexiscan Tetrofosmin stress test 01/27/2022: 1 Day Rest/Stress protocol.  Stress EKG is non-diagnostic due to target heart rate not achieved and 10/10 chest pain during stress.  Chest pain: Yes Hypertensive response to exercise: No, present at baseline (rest 190/80, peak 180/68). Normal myocardial perfusion without convincing evidence of reversible ischemia or prior infarct. Left ventricular size normal, wall thickness preserved, calculated LVEF 62%. No prior studies available for comparison Low risk study, clinical correlation required due to precordial pain during pharmacological stress and uncontrolled hypertension.    Neuro/Psych  Headaches PSYCHIATRIC DISORDERS Anxiety Depression     Neuromuscular disease    GI/Hepatic negative GI ROS, Neg liver ROS,,,  Endo/Other  negative endocrine ROS     Renal/GU negative Renal ROS     Musculoskeletal  (+) Arthritis ,  Fibromyalgia -  Abdominal   Peds  Hematology negative hematology ROS (+)   Anesthesia Other Findings   Reproductive/Obstetrics                             Anesthesia Physical Anesthesia Plan  ASA: 3  Anesthesia Plan: General   Post-op Pain Management: Tylenol PO (pre-op)* and Gabapentin PO (pre-op)*   Induction: Intravenous  PONV Risk Score and Plan: 4 or greater and Ondansetron, Dexamethasone, Midazolam and Scopolamine patch - Pre-op  Airway Management Planned: Oral ETT  Additional Equipment: None  Intra-op Plan:   Post-operative Plan: Extubation in OR  Informed Consent: I have reviewed the patients History and Physical, chart, labs and discussed the procedure including the risks, benefits and alternatives for the proposed anesthesia with the patient or authorized representative who has indicated his/her understanding and acceptance.     Dental advisory given  Plan Discussed with: CRNA  Anesthesia Plan Comments: (See PAT note written 06/10/2022 by Shonna Chock, PA-C.  )       Anesthesia Quick Evaluation

## 2022-06-10 NOTE — Progress Notes (Signed)
Anesthesia Chart Review:  Case: [id number] Date/Time: 06/17/22 1313   Procedure: L3-L4 PLIF (Back)   Anesthesia type: General   Pre-op diagnosis: HNP   [facility]   Surgeons: Tia Alert, MD       DISCUSSION: It is a 77 year old female scheduled for the above procedure. CT myelogram last month showed new large right-sided disc extrusion at L3-4 contributing to severe right lateral recess stenosis, moderate to severe spinal stenosis, and moderate right neural foraminal stenosis.  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   Last follow-up with neurologist Dr. Terrace Arabia was on 05/17/22. She notes that patient has "relapsing remitting multiple sclerosis, was treated with Betaseron 4 -5 years, but could not tolerate the side effect, now is not on any immunomodulation therapy."  She has associated gait disorder, neurogenic bladder, history of left optic neuritis and total colectomy with small bowel pull-through in 1991.  She has chronic neuropathic pain in her lower extremities and is on gabapentin, Horizant, and Cymbalta. She had pending CT myelogram of the L-spine at that time. Last brain MRI 12/2021 showed no acute abnormality, multiple T2/FLAIR signal abnormality, small vessel disease versus possible multiple sclerosis. There were 2 new foci of white matter and right subcortical region when compared to 2015 scan. She reported that she is on chronic Keflex every other day for UTI  prevention.   Last endocrinologist visit with Dr. Lucianne Muss was on 04/20/22 for follow-up orthostatic dysautonomic hypotension syndrome and long-standing secondary adrenal insufficiency with fairly typical symptoms at baseline and very low cortisol levels on the stimulation test. Adrenal insufficiency has been well controlled on methylprednisolone 4 mg in the morning and 2 mg in the evening. BP had been running higher, so Florinef decreased and asked to monitor fluctuations.  - Due to elevated BP readings, Dr. Lucianne Muss advised a Florinef wean before surgery. She apparently did this in the past and then resumed some time post-operatively. Last dose 06/08/22.   Last visit with cardiologist Dr. Melton Alar was on 02/08/22 for chest pain follow-up. 01/2022 stress and echocardiogram were within normal limits. Symptoms felt related to anxiety, although episodes of significantly elevated BP could be exacerbating. She could not tolerate losartan. Florinef had been adjusted due to higher readings. She is currently on propranolol 10 mg TID as needed for palpitations/anxiety--and typically takes BID. Six month follow-up planned. BP 140/64 at PAT.   Anesthesia team to evaluate on the day of surgery. She will take her Medrol on the day of surgery. I encouraged her to reach out to Dr. Lucianne Muss if she or Dr. Yetta Barre were unsure if she would require steroids adjustments after surgery.      VS: BP (!) 140/64   Pulse 75   Temp 36.5 C (Oral)   Resp 16   Ht 5' (1.524 m)   Wt 51.9 kg   SpO2 96%   BMI 22.34 kg/m    PROVIDERS: [name], MD is PCP  Melton Alar Rozell Searing, DO is cardiologist Levert Feinstein, MD is neurologist Reather Littler, MD is endocrinologist   LABS: Labs reviewed: Acceptable for surgery. (all labs ordered are listed, but  only abnormal results are displayed)  Labs Reviewed  BASIC METABOLIC PANEL - Abnormal; Notable for the following components:      Result Value   BUN 25 (*)    Creatinine, Ser 1.05 (*)     Calcium 10.6 (*)    GFR, Estimated 56 (*)    All other components within normal limits  CBC - Abnormal; Notable for the following components:   MCV 101.7 (*)    All other components within normal limits  SURGICAL PCR SCREEN  PROTIME-INR  TYPE AND SCREEN     IMAGES: CT L-spine with myelogram 4/176/24: IMPRESSION: 1. New large right-sided disc extrusion at L3-4 contributing to severe right lateral recess stenosis, moderate to severe spinal stenosis, and moderate right neural foraminal stenosis. 2. Increased size of a moderately large disc extrusion at L1-2 without significant stenosis. 3. Prior L4-S1 fusion without significant residual stenosis. 4.  Aortic Atherosclerosis (ICD10-I70.0).   MRI Brain 01/01/22: IMPRESSION: No acute brain finding. Multiple foci of T2 and FLAIR signal within the cerebral hemispheric white matter consistent with the clinical diagnosis of multiple sclerosis. Some of these could relate to chronic small vessel ischemic change. 2 new foci of white matter signal are seen when compared to the prior examination of January 2015 in the right hemisphere, including a 6 mm focus adjacent to the frontal horn of the right lateral ventricle and a 4 mm focus in the subcortical white matter of the deep right insula. Again, these could be ischemic changes or demyelinating changes.     EKG:12/27/2021: Sinus Rhythm with LAE and LVH. Nonspecific T-abnormality    CV: Echocardiogram 01/27/2022:   Normal LV systolic function with visual EF 60-65%. Left ventricle cavity is normal in size. Mild concentric hypertrophy of the left ventricle. Normal global wall motion. Doppler evidence of grade I (impaired) diastolic dysfunction, normal LAP. Calculated EF 68%. Structurally normal mitral valve.  Mild to moderate mitral regurgitation. Structurally normal tricuspid valve.  Moderate tricuspid regurgitation. No evidence of pulmonary hypertension. Pericardium is normal. Trace  pericardial effusion. There is no hemodynamic significance. No prior available for comparison.     Lexiscan Tetrofosmin stress test 01/27/2022: 1 Day Rest/Stress protocol.  Stress EKG is non-diagnostic due to target heart rate not achieved and 10/10 chest pain during stress.  Chest pain: Yes Hypertensive response to exercise: No, present at baseline (rest 190/80, peak 180/68). Normal myocardial perfusion without convincing evidence of reversible ischemia or prior infarct. Left ventricular size normal, wall thickness preserved, calculated LVEF 62%. No prior studies available for comparison Low risk study, clinical correlation required due to precordial pain during pharmacological stress and uncontrolled hypertension.   Past Medical History:  Diagnosis Date   Addison's disease (HCC)    takes Solu Cortef daily   Anemia    takes Ferrous Sulfate daily   Anxiety    takes Xanax nightly   Arthritis    Chronic back pain    stenosis   Depression    takes Cymbalta daily   Dizziness    if b/p drops    Fibromyalgia    Fibromyalgia    History of blood transfusion    no abnormal reaction noted   History of bronchitis    many yrs ago    Hypokalemia    takes Potassium daily   Hypotension    Hypotension    takes Florinef daily   IBS (irritable bowel syndrome)    takes Align daily   Insomnia    takes Trazodone nightly  Joint pain    Multiple sclerosis (HCC)    doesn't take any meds   Multiple sclerosis (HCC)    New onset headache 12/22/2021   Nocturia    Osteoporosis    Palpitations    Peripheral neuropathy    Primary localized osteoarthritis of left knee 08/11/2020   Restless leg syndrome    Seasonal allergies    takes Zyrtec daily;uses Flonase daily as needed   Syncope    when missed methylprednisolone dose   Urinary frequency    takes Flomax daily   Weakness    numbness and tingling    Past Surgical History:  Procedure Laterality Date   ABDOMINAL HYSTERECTOMY   02/07/1986   APPENDECTOMY  02/07/1986   CESAREAN SECTION  1973/1977   x2   CHOLECYSTECTOMY  02/08/1995   COLECTOMY  02/08/1988   COLONOSCOPY     ESOPHAGOGASTRODUODENOSCOPY     EYE SURGERY     bilateral - /w IOL- cataracts   FRACTURE SURGERY Right    rods and screws-right leg   LUMBAR LAMINECTOMY/DECOMPRESSION MICRODISCECTOMY Left 01/24/2013   Procedure: LUMBAR FIVE TO SACRAL ONE LUMBAR LAMINECTOMY/DECOMPRESSION MICRODISCECTOMY 1 LEVEL;  Surgeon: Tia Alert, MD;  Location: [facility];  Service: Neurosurgery;  Laterality: Left;   MAXIMUM ACCESS (MAS)POSTERIOR LUMBAR INTERBODY FUSION (PLIF) 1 LEVEL N/A 06/18/2014   Procedure: MAXIMUM ACCESS SURGERY POSTERIOR LUMBAR INTERBODY FUSION LUMBAR FIVE TO SACRAL ONE ;  Surgeon: Tia Alert, MD;  Location: [facility];  Service: Neurosurgery;  Laterality: N/A;   SPINAL CORD STIMULATOR BATTERY EXCHANGE Right 07/30/2021   Procedure: Spinal cord stimulator battery replacement;  Surgeon: Tia Alert, MD;  Location: [facility] OR;  Service: Neurosurgery;  Laterality: Right;   SPINAL CORD STIMULATOR IMPLANT     TOTAL KNEE ARTHROPLASTY Left 08/24/2020   Procedure: TOTAL KNEE ARTHROPLASTY;  Surgeon: Salvatore Marvel, MD;  Location: [facility] ORS;  Service: Orthopedics;  Laterality: Left;    MEDICATIONS:  fludrocortisone (FLORINEF) 0.1 MG tablet   propranolol (INDERAL) 10 MG tablet   acetaminophen (TYLENOL) 500 MG tablet   acetaminophen (TYLENOL) 650 MG CR tablet   alfuzosin (UROXATRAL) 10 MG 24 hr tablet   Ascorbic Acid (VITAMIN C PO)   B Complex Vitamins (VITAMIN B COMPLEX PO)   cephALEXin (KEFLEX) 250 MG capsule   cholecalciferol (VITAMIN D3) 25 MCG (1000 UNIT) tablet   clonazePAM (KLONOPIN) 0.5 MG tablet   CRANBERRY PO   dicyclomine (BENTYL) 10 MG capsule   DULoxetine (CYMBALTA) 60 MG capsule   Erenumab-aooe (AIMOVIG) 70 MG/ML SOAJ   ferrous sulfate 325 (65 FE) MG tablet   fluticasone (CUTIVATE) 0.05 % cream   fluticasone (FLONASE) 50 MCG/ACT nasal  spray   gabapentin (NEURONTIN) 300 MG capsule   HORIZANT 600 MG TBCR   methylPREDNISolone (MEDROL) 4 MG tablet   Multiple Vitamin (MULTIVITAMIN) tablet   OVER THE COUNTER MEDICATION   Polyethyl Glycol-Propyl Glycol (SYSTANE OP)   polyethylene glycol (MIRALAX) 17 g packet   Probiotic Product (PROBIOTIC PO)   promethazine (PHENERGAN) 25 MG tablet   Rimegepant Sulfate (NURTEC) 75 MG TBDP   tiZANidine (ZANAFLEX) 2 MG tablet   No current facility-administered medications for this encounter.    Shonna Chock, PA-C Surgical Short Stay/Anesthesiology [facility] Phone [phone] [facility] Phone [phone] 06/10/2022 5:05 PM

## 2022-06-12 ENCOUNTER — Encounter: Payer: Self-pay | Admitting: Endocrinology

## 2022-06-14 ENCOUNTER — Encounter: Payer: Self-pay | Admitting: Cardiology

## 2022-06-14 NOTE — Progress Notes (Signed)
You saw her on Jan 2nd 2024.   Malyn Aytes [place], DO, [facility]

## 2022-06-15 ENCOUNTER — Encounter: Payer: Self-pay | Admitting: Neurology

## 2022-06-15 NOTE — Progress Notes (Signed)
Ok she is seeing you July 2

## 2022-06-17 ENCOUNTER — Telehealth: Payer: Self-pay

## 2022-06-17 ENCOUNTER — Other Ambulatory Visit (HOSPITAL_COMMUNITY): Payer: Self-pay

## 2022-06-17 NOTE — Telephone Encounter (Signed)
    I could not find any documentation concerning triptans in the chart-please advise.Please advise.

## 2022-06-17 NOTE — Telephone Encounter (Signed)
    Did not find current documentation in the chart-please advise

## 2022-06-20 NOTE — Telephone Encounter (Signed)
Could not get the PA to push thru on CMM-Faxed form along with clinicals to [phone].

## 2022-06-20 NOTE — Telephone Encounter (Signed)
Could not get the PA to process on CMM-faxed form along with clinicals to [phone]

## 2022-06-21 ENCOUNTER — Other Ambulatory Visit (HOSPITAL_COMMUNITY): Payer: Self-pay

## 2022-06-21 NOTE — Telephone Encounter (Signed)
Pharmacy Patient Advocate Encounter  Prior Authorization for Aimovig 70MG /ML Auto Injector has been approved by [employer] (ins).    PA # Request ID# [id number] Effective dates: 06/20/2022 through 12/21/2022

## 2022-06-21 NOTE — Telephone Encounter (Signed)
Pharmacy Patient Advocate Encounter  Prior Authorization for Nurtec ODT 75 MG  has been approved by [employer] (ins).    PA # Request ID# [id number] Effective dates: 06/20/2022 through 09/20/2022

## 2022-06-22 ENCOUNTER — Inpatient Hospital Stay (HOSPITAL_COMMUNITY): Payer: PPO | Admitting: Anesthesiology

## 2022-06-22 ENCOUNTER — Encounter (HOSPITAL_COMMUNITY)
Admission: RE | Disposition: A | Discharge status: Home / Self Care | Payer: Self-pay | Source: Home / Self Care | Attending: Neurological Surgery

## 2022-06-22 ENCOUNTER — Other Ambulatory Visit: Payer: Self-pay

## 2022-06-22 ENCOUNTER — Inpatient Hospital Stay (HOSPITAL_COMMUNITY)
Admission: RE | Admit: 2022-06-22 | Discharge: 2022-06-23 | DRG: 454 | Disposition: A | Discharge status: Home / Self Care | Payer: PPO | Attending: Neurological Surgery | Admitting: Neurological Surgery

## 2022-06-22 ENCOUNTER — Inpatient Hospital Stay (HOSPITAL_COMMUNITY): Payer: PPO

## 2022-06-22 ENCOUNTER — Encounter (HOSPITAL_COMMUNITY): Payer: Self-pay | Admitting: Neurological Surgery

## 2022-06-22 ENCOUNTER — Inpatient Hospital Stay (HOSPITAL_COMMUNITY): Payer: PPO | Admitting: Vascular Surgery

## 2022-06-22 DIAGNOSIS — Z9682 Presence of neurostimulator: Secondary | ICD-10-CM

## 2022-06-22 DIAGNOSIS — Z90710 Acquired absence of both cervix and uterus: Secondary | ICD-10-CM | POA: Diagnosis not present

## 2022-06-22 DIAGNOSIS — G4700 Insomnia, unspecified: Secondary | ICD-10-CM | POA: Diagnosis present

## 2022-06-22 DIAGNOSIS — M5126 Other intervertebral disc displacement, lumbar region: Secondary | ICD-10-CM | POA: Diagnosis not present

## 2022-06-22 DIAGNOSIS — F419 Anxiety disorder, unspecified: Secondary | ICD-10-CM | POA: Diagnosis not present

## 2022-06-22 DIAGNOSIS — Z8249 Family history of ischemic heart disease and other diseases of the circulatory system: Secondary | ICD-10-CM | POA: Diagnosis not present

## 2022-06-22 DIAGNOSIS — Z79899 Other long term (current) drug therapy: Secondary | ICD-10-CM

## 2022-06-22 DIAGNOSIS — Z888 Allergy status to other drugs, medicaments and biological substances status: Secondary | ICD-10-CM

## 2022-06-22 DIAGNOSIS — Z96652 Presence of left artificial knee joint: Secondary | ICD-10-CM | POA: Diagnosis present

## 2022-06-22 DIAGNOSIS — F418 Other specified anxiety disorders: Secondary | ICD-10-CM

## 2022-06-22 DIAGNOSIS — F32A Depression, unspecified: Secondary | ICD-10-CM | POA: Diagnosis present

## 2022-06-22 DIAGNOSIS — M797 Fibromyalgia: Secondary | ICD-10-CM | POA: Diagnosis present

## 2022-06-22 DIAGNOSIS — Z823 Family history of stroke: Secondary | ICD-10-CM | POA: Diagnosis not present

## 2022-06-22 DIAGNOSIS — G2581 Restless legs syndrome: Secondary | ICD-10-CM | POA: Diagnosis present

## 2022-06-22 DIAGNOSIS — Z4789 Encounter for other orthopedic aftercare: Secondary | ICD-10-CM | POA: Diagnosis not present

## 2022-06-22 DIAGNOSIS — G629 Polyneuropathy, unspecified: Secondary | ICD-10-CM | POA: Diagnosis present

## 2022-06-22 DIAGNOSIS — E271 Primary adrenocortical insufficiency: Secondary | ICD-10-CM | POA: Diagnosis not present

## 2022-06-22 DIAGNOSIS — G43909 Migraine, unspecified, not intractable, without status migrainosus: Secondary | ICD-10-CM | POA: Diagnosis present

## 2022-06-22 DIAGNOSIS — Z886 Allergy status to analgesic agent status: Secondary | ICD-10-CM

## 2022-06-22 DIAGNOSIS — M5116 Intervertebral disc disorders with radiculopathy, lumbar region: Secondary | ICD-10-CM | POA: Diagnosis not present

## 2022-06-22 DIAGNOSIS — M1712 Unilateral primary osteoarthritis, left knee: Secondary | ICD-10-CM | POA: Diagnosis present

## 2022-06-22 DIAGNOSIS — Z7952 Long term (current) use of systemic steroids: Secondary | ICD-10-CM | POA: Diagnosis not present

## 2022-06-22 DIAGNOSIS — M810 Age-related osteoporosis without current pathological fracture: Secondary | ICD-10-CM | POA: Diagnosis not present

## 2022-06-22 DIAGNOSIS — Z880 Allergy status to penicillin: Secondary | ICD-10-CM

## 2022-06-22 DIAGNOSIS — M48061 Spinal stenosis, lumbar region without neurogenic claudication: Principal | ICD-10-CM | POA: Diagnosis present

## 2022-06-22 DIAGNOSIS — Z9049 Acquired absence of other specified parts of digestive tract: Secondary | ICD-10-CM | POA: Diagnosis not present

## 2022-06-22 DIAGNOSIS — Z981 Arthrodesis status: Secondary | ICD-10-CM

## 2022-06-22 DIAGNOSIS — Z881 Allergy status to other antibiotic agents status: Secondary | ICD-10-CM

## 2022-06-22 DIAGNOSIS — K589 Irritable bowel syndrome without diarrhea: Secondary | ICD-10-CM | POA: Diagnosis not present

## 2022-06-22 DIAGNOSIS — G35 Multiple sclerosis: Secondary | ICD-10-CM | POA: Diagnosis not present

## 2022-06-22 DIAGNOSIS — I10 Essential (primary) hypertension: Secondary | ICD-10-CM

## 2022-06-22 SURGERY — POSTERIOR LUMBAR FUSION 1 WITH HARDWARE REMOVAL
Anesthesia: General | Site: Back

## 2022-06-22 MED ORDER — CEPHALEXIN 250 MG PO CAPS
250.0000 mg | ORAL_CAPSULE | ORAL | Status: DC
  Administered 2022-06-23: 250 mg via ORAL
  Filled 2022-06-22: qty 1

## 2022-06-22 MED ORDER — CHLORHEXIDINE GLUCONATE 0.12 % MT SOLN
OROMUCOSAL | Status: AC
  Administered 2022-06-22: 15 mL via OROMUCOSAL
  Filled 2022-06-22: qty 15

## 2022-06-22 MED ORDER — DEXAMETHASONE SODIUM PHOSPHATE 10 MG/ML IJ SOLN
INTRAMUSCULAR | Status: AC
  Filled 2022-06-22: qty 1

## 2022-06-22 MED ORDER — THROMBIN 5000 UNITS EX SOLR: OROMUCOSAL | Status: DC | PRN

## 2022-06-22 MED ORDER — TIZANIDINE HCL 4 MG PO TABS
4.0000 mg | ORAL_TABLET | Freq: Every day | ORAL | Status: DC
  Administered 2022-06-22: 4 mg via ORAL
  Filled 2022-06-22: qty 1

## 2022-06-22 MED ORDER — FLUDROCORTISONE ACETATE 0.1 MG PO TABS
0.1000 mg | ORAL_TABLET | Freq: Every day | ORAL | Status: DC
  Administered 2022-06-23: 0.1 mg via ORAL
  Filled 2022-06-22 (×2): qty 1

## 2022-06-22 MED ORDER — EPHEDRINE SULFATE-NACL 50-0.9 MG/10ML-% IV SOSY
PREFILLED_SYRINGE | INTRAVENOUS | Status: DC | PRN
  Administered 2022-06-22: 80 mg via INTRAVENOUS
  Administered 2022-06-22 (×3): 5 mg via INTRAVENOUS

## 2022-06-22 MED ORDER — PROPRANOLOL HCL 10 MG PO TABS: 10.0000 mg | ORAL_TABLET | Freq: Three times a day (TID) | ORAL | Status: DC | PRN

## 2022-06-22 MED ORDER — SODIUM CHLORIDE 0.9% FLUSH
3.0000 mL | Freq: Two times a day (BID) | INTRAVENOUS | Status: DC
  Administered 2022-06-22: 3 mL via INTRAVENOUS

## 2022-06-22 MED ORDER — ACETAMINOPHEN 160 MG/5ML PO SOLN: 325.0000 mg | Freq: Once | ORAL | Status: DC | PRN

## 2022-06-22 MED ORDER — ACETAMINOPHEN 10 MG/ML IV SOLN: 1000.0000 mg | Freq: Once | INTRAVENOUS | Status: DC | PRN

## 2022-06-22 MED ORDER — DULOXETINE HCL 30 MG PO CPEP
60.0000 mg | ORAL_CAPSULE | Freq: Every day | ORAL | Status: DC
  Administered 2022-06-23: 60 mg via ORAL
  Filled 2022-06-22: qty 2

## 2022-06-22 MED ORDER — PHENYLEPHRINE HCL-NACL 20-0.9 MG/250ML-% IV SOLN
INTRAVENOUS | Status: DC | PRN
  Administered 2022-06-22: 20 ug/min via INTRAVENOUS

## 2022-06-22 MED ORDER — GABAPENTIN ENACARBIL ER 600 MG PO TBCR: 1.0000 | EXTENDED_RELEASE_TABLET | Freq: Every day | ORAL | Status: DC

## 2022-06-22 MED ORDER — VANCOMYCIN HCL IN DEXTROSE 1-5 GM/200ML-% IV SOLN
INTRAVENOUS | Status: AC
  Administered 2022-06-22: 1000 mg via INTRAVENOUS
  Filled 2022-06-22: qty 200

## 2022-06-22 MED ORDER — ACETAMINOPHEN 500 MG PO TABS
1000.0000 mg | ORAL_TABLET | Freq: Four times a day (QID) | ORAL | Status: DC
  Administered 2022-06-23: 500 mg via ORAL
  Administered 2022-06-23: 1000 mg via ORAL
  Filled 2022-06-22 (×3): qty 2

## 2022-06-22 MED ORDER — MENTHOL 3 MG MT LOZG: 1.0000 | LOZENGE | OROMUCOSAL | Status: DC | PRN

## 2022-06-22 MED ORDER — PROPOFOL 10 MG/ML IV BOLUS
INTRAVENOUS | Status: AC
  Filled 2022-06-22: qty 20

## 2022-06-22 MED ORDER — THROMBIN 5000 UNITS EX SOLR
CUTANEOUS | Status: AC
  Filled 2022-06-22: qty 5000

## 2022-06-22 MED ORDER — HYDROCODONE-ACETAMINOPHEN 7.5-325 MG PO TABS
1.0000 | ORAL_TABLET | ORAL | Status: DC | PRN
  Administered 2022-06-22 – 2022-06-23 (×4): 1 via ORAL
  Filled 2022-06-22 (×4): qty 1

## 2022-06-22 MED ORDER — ORAL CARE MOUTH RINSE: 15.0000 mL | Freq: Once | OROMUCOSAL | Status: AC

## 2022-06-22 MED ORDER — CHLORHEXIDINE GLUCONATE 0.12 % MT SOLN: 15.0000 mL | Freq: Once | OROMUCOSAL | Status: AC

## 2022-06-22 MED ORDER — 0.9 % SODIUM CHLORIDE (POUR BTL) OPTIME
TOPICAL | Status: DC | PRN
  Administered 2022-06-22 (×2): 1000 mL

## 2022-06-22 MED ORDER — FENTANYL CITRATE (PF) 250 MCG/5ML IJ SOLN
INTRAMUSCULAR | Status: DC | PRN
  Administered 2022-06-22: 100 ug via INTRAVENOUS
  Administered 2022-06-22: 50 ug via INTRAVENOUS

## 2022-06-22 MED ORDER — ONDANSETRON HCL 4 MG/2ML IJ SOLN
4.0000 mg | Freq: Four times a day (QID) | INTRAMUSCULAR | Status: DC | PRN
  Administered 2022-06-22: 4 mg via INTRAVENOUS
  Filled 2022-06-22: qty 2

## 2022-06-22 MED ORDER — GABAPENTIN 300 MG PO CAPS: 300.0000 mg | ORAL_CAPSULE | ORAL | Status: AC

## 2022-06-22 MED ORDER — FENTANYL CITRATE (PF) 250 MCG/5ML IJ SOLN
INTRAMUSCULAR | Status: AC
  Filled 2022-06-22: qty 5

## 2022-06-22 MED ORDER — THROMBIN 20000 UNITS EX SOLR
CUTANEOUS | Status: AC
  Filled 2022-06-22: qty 20000

## 2022-06-22 MED ORDER — VANCOMYCIN HCL IN DEXTROSE 1-5 GM/200ML-% IV SOLN
1000.0000 mg | Freq: Once | INTRAVENOUS | Status: AC
  Administered 2022-06-22: 1000 mg via INTRAVENOUS
  Filled 2022-06-22: qty 200

## 2022-06-22 MED ORDER — SODIUM CHLORIDE 0.9 % IV SOLN: 250.0000 mL | INTRAVENOUS | Status: DC

## 2022-06-22 MED ORDER — ACETAMINOPHEN 325 MG PO TABS: 325.0000 mg | ORAL_TABLET | Freq: Once | ORAL | Status: DC | PRN

## 2022-06-22 MED ORDER — MORPHINE SULFATE (PF) 2 MG/ML IV SOLN
2.0000 mg | INTRAVENOUS | Status: DC | PRN
  Administered 2022-06-22 (×2): 2 mg via INTRAVENOUS
  Filled 2022-06-22 (×2): qty 1

## 2022-06-22 MED ORDER — HYDROMORPHONE HCL 1 MG/ML IJ SOLN
INTRAMUSCULAR | Status: AC
  Filled 2022-06-22: qty 1

## 2022-06-22 MED ORDER — CHLORHEXIDINE GLUCONATE CLOTH 2 % EX PADS: 6.0000 | MEDICATED_PAD | Freq: Once | CUTANEOUS | Status: DC

## 2022-06-22 MED ORDER — ONDANSETRON HCL 4 MG/2ML IJ SOLN
INTRAMUSCULAR | Status: AC
  Filled 2022-06-22: qty 2

## 2022-06-22 MED ORDER — FERROUS SULFATE 325 (65 FE) MG PO TABS: 325.0000 mg | ORAL_TABLET | Freq: Every day | ORAL | Status: DC

## 2022-06-22 MED ORDER — ONDANSETRON HCL 4 MG PO TABS: 4.0000 mg | ORAL_TABLET | Freq: Four times a day (QID) | ORAL | Status: DC | PRN

## 2022-06-22 MED ORDER — PHENOL 1.4 % MT LIQD: 1.0000 | OROMUCOSAL | Status: DC | PRN

## 2022-06-22 MED ORDER — CLONAZEPAM 0.5 MG PO TABS
0.5000 mg | ORAL_TABLET | Freq: Every evening | ORAL | Status: DC | PRN
  Administered 2022-06-22: 0.5 mg via ORAL
  Filled 2022-06-22: qty 1

## 2022-06-22 MED ORDER — LIDOCAINE 2% (20 MG/ML) 5 ML SYRINGE
INTRAMUSCULAR | Status: DC | PRN
  Administered 2022-06-22: 40 mg via INTRAVENOUS
  Administered 2022-06-22: 60 mg via INTRAVENOUS

## 2022-06-22 MED ORDER — ROCURONIUM BROMIDE 10 MG/ML (PF) SYRINGE
PREFILLED_SYRINGE | INTRAVENOUS | Status: AC
  Filled 2022-06-22: qty 10

## 2022-06-22 MED ORDER — POTASSIUM CHLORIDE IN NACL 20-0.9 MEQ/L-% IV SOLN: INTRAVENOUS | Status: DC

## 2022-06-22 MED ORDER — BUPIVACAINE HCL (PF) 0.25 % IJ SOLN
INTRAMUSCULAR | Status: AC
  Filled 2022-06-22: qty 30

## 2022-06-22 MED ORDER — ALBUMIN HUMAN 5 % IV SOLN: INTRAVENOUS | Status: DC | PRN

## 2022-06-22 MED ORDER — ROCURONIUM BROMIDE 10 MG/ML (PF) SYRINGE
PREFILLED_SYRINGE | INTRAVENOUS | Status: DC | PRN
  Administered 2022-06-22: 50 mg via INTRAVENOUS
  Administered 2022-06-22: 20 mg via INTRAVENOUS

## 2022-06-22 MED ORDER — MIDAZOLAM HCL 2 MG/2ML IJ SOLN
INTRAMUSCULAR | Status: AC
  Filled 2022-06-22: qty 2

## 2022-06-22 MED ORDER — ACETAMINOPHEN 500 MG PO TABS: 1000.0000 mg | ORAL_TABLET | ORAL | Status: AC

## 2022-06-22 MED ORDER — LACTATED RINGERS IV SOLN: INTRAVENOUS | Status: DC

## 2022-06-22 MED ORDER — SENNA 8.6 MG PO TABS
1.0000 | ORAL_TABLET | Freq: Two times a day (BID) | ORAL | Status: DC
  Administered 2022-06-22 – 2022-06-23 (×2): 8.6 mg via ORAL
  Filled 2022-06-22 (×2): qty 1

## 2022-06-22 MED ORDER — BUPIVACAINE HCL (PF) 0.25 % IJ SOLN
INTRAMUSCULAR | Status: DC | PRN
  Administered 2022-06-22: 8 mL

## 2022-06-22 MED ORDER — PROPOFOL 10 MG/ML IV BOLUS
INTRAVENOUS | Status: DC | PRN
  Administered 2022-06-22: 120 mg via INTRAVENOUS

## 2022-06-22 MED ORDER — HYDROMORPHONE HCL 1 MG/ML IJ SOLN
0.2500 mg | INTRAMUSCULAR | Status: DC | PRN
  Administered 2022-06-22 (×4): 0.5 mg via INTRAVENOUS

## 2022-06-22 MED ORDER — VITAMIN C 500 MG PO TABS
500.0000 mg | ORAL_TABLET | Freq: Every day | ORAL | Status: DC
  Administered 2022-06-23: 500 mg via ORAL
  Filled 2022-06-22: qty 1

## 2022-06-22 MED ORDER — ALFUZOSIN HCL ER 10 MG PO TB24
10.0000 mg | ORAL_TABLET | Freq: Every day | ORAL | Status: DC
  Filled 2022-06-22: qty 1

## 2022-06-22 MED ORDER — SURGIRINSE WOUND IRRIGATION SYSTEM - OPTIME: TOPICAL | Status: DC | PRN

## 2022-06-22 MED ORDER — GABAPENTIN 300 MG PO CAPS
900.0000 mg | ORAL_CAPSULE | Freq: Every day | ORAL | Status: DC
  Administered 2022-06-22: 900 mg via ORAL
  Filled 2022-06-22: qty 3

## 2022-06-22 MED ORDER — DICYCLOMINE HCL 10 MG PO CAPS
10.0000 mg | ORAL_CAPSULE | Freq: Three times a day (TID) | ORAL | Status: DC
  Administered 2022-06-22 – 2022-06-23 (×2): 10 mg via ORAL
  Filled 2022-06-22 (×4): qty 1

## 2022-06-22 MED ORDER — VANCOMYCIN HCL IN DEXTROSE 1-5 GM/200ML-% IV SOLN: 1000.0000 mg | INTRAVENOUS | Status: AC

## 2022-06-22 MED ORDER — SODIUM CHLORIDE 0.9% FLUSH: 3.0000 mL | INTRAVENOUS | Status: DC | PRN

## 2022-06-22 MED ORDER — DEXAMETHASONE SODIUM PHOSPHATE 10 MG/ML IJ SOLN
INTRAMUSCULAR | Status: DC | PRN
  Administered 2022-06-22: 10 mg via INTRAVENOUS

## 2022-06-22 MED ORDER — ACETAMINOPHEN 500 MG PO TABS
ORAL_TABLET | ORAL | Status: AC
  Administered 2022-06-22: 1000 mg via ORAL
  Filled 2022-06-22: qty 2

## 2022-06-22 MED ORDER — ONDANSETRON HCL 4 MG/2ML IJ SOLN
INTRAMUSCULAR | Status: DC | PRN
  Administered 2022-06-22: 4 mg via INTRAVENOUS

## 2022-06-22 MED ORDER — ACETAMINOPHEN 325 MG PO TABS: 650.0000 mg | ORAL_TABLET | ORAL | Status: DC | PRN

## 2022-06-22 MED ORDER — THROMBIN 20000 UNITS EX SOLR: CUTANEOUS | Status: DC | PRN

## 2022-06-22 MED ORDER — PROMETHAZINE HCL 25 MG/ML IJ SOLN: 6.2500 mg | INTRAMUSCULAR | Status: DC | PRN

## 2022-06-22 MED ORDER — SUGAMMADEX SODIUM 200 MG/2ML IV SOLN
INTRAVENOUS | Status: DC | PRN
  Administered 2022-06-22: 100 mg via INTRAVENOUS

## 2022-06-22 MED ORDER — PHENYLEPHRINE 80 MCG/ML (10ML) SYRINGE FOR IV PUSH (FOR BLOOD PRESSURE SUPPORT)
PREFILLED_SYRINGE | INTRAVENOUS | Status: DC | PRN
  Administered 2022-06-22: 240 ug via INTRAVENOUS
  Administered 2022-06-22: 80 ug via INTRAVENOUS
  Administered 2022-06-22 (×2): 240 ug via INTRAVENOUS

## 2022-06-22 MED ORDER — GABAPENTIN 300 MG PO CAPS
ORAL_CAPSULE | ORAL | Status: AC
  Administered 2022-06-22: 300 mg via ORAL
  Filled 2022-06-22: qty 1

## 2022-06-22 MED ORDER — AMISULPRIDE (ANTIEMETIC) 5 MG/2ML IV SOLN: 10.0000 mg | Freq: Once | INTRAVENOUS | Status: DC | PRN

## 2022-06-22 MED ORDER — ACETAMINOPHEN 650 MG RE SUPP: 650.0000 mg | RECTAL | Status: DC | PRN

## 2022-06-22 SURGICAL SUPPLY — 67 items
ADH SKN CLS APL DERMABOND .7 (GAUZE/BANDAGES/DRESSINGS) ×1
APL SKNCLS STERI-STRIP NONHPOA (GAUZE/BANDAGES/DRESSINGS) ×1
BAG COUNTER SPONGE SURGICOUNT (BAG) ×2 IMPLANT
BAG SPNG CNTER NS LX DISP (BAG) ×1
BASKET BONE COLLECTION (BASKET) ×2 IMPLANT
BENZOIN TINCTURE PRP APPL 2/3 (GAUZE/BANDAGES/DRESSINGS) ×2 IMPLANT
BIT DRILL PLIF MAS DISP 5.5MM (DRILL) IMPLANT
BLADE BONE MILL MEDIUM (MISCELLANEOUS) ×2 IMPLANT
BLADE CLIPPER SURG (BLADE) IMPLANT
BONE MATRIX OSTEOCEL PRO MED (Bone Implant) IMPLANT
BUR CARBIDE MATCH 3.0 (BURR) ×2 IMPLANT
CAGE MAS PLIF 9X9X23-8 LUMBAR (Cage) IMPLANT
CANISTER SUCT 3000ML PPV (MISCELLANEOUS) ×2 IMPLANT
CNTNR URN SCR LID CUP LEK RST (MISCELLANEOUS) ×2 IMPLANT
CONT SPEC 4OZ STRL OR WHT (MISCELLANEOUS) ×1
COVER BACK TABLE 60X90IN (DRAPES) ×2 IMPLANT
DERMABOND ADVANCED .7 DNX12 (GAUZE/BANDAGES/DRESSINGS) ×2 IMPLANT
DRAPE C-ARM 42X72 X-RAY (DRAPES) ×2 IMPLANT
DRAPE C-ARMOR (DRAPES) ×4 IMPLANT
DRAPE LAPAROTOMY 100X72X124 (DRAPES) ×2 IMPLANT
DRAPE SURG 17X23 STRL (DRAPES) ×2 IMPLANT
DRILL PLIF MAS DISP 5.5MM (DRILL) ×1
DRSG OPSITE POSTOP 4X6 (GAUZE/BANDAGES/DRESSINGS) IMPLANT
DURAPREP 26ML APPLICATOR (WOUND CARE) ×2 IMPLANT
ELECT REM PT RETURN 9FT ADLT (ELECTROSURGICAL) ×1
ELECTRODE REM PT RTRN 9FT ADLT (ELECTROSURGICAL) ×2 IMPLANT
EVACUATOR 1/8 PVC DRAIN (DRAIN) ×2 IMPLANT
GAUZE 4X4 16PLY ~~LOC~~+RFID DBL (SPONGE) IMPLANT
GLOVE BIO SURGEON STRL SZ7 (GLOVE) IMPLANT
GLOVE BIO SURGEON STRL SZ8 (GLOVE) ×4 IMPLANT
GLOVE BIOGEL PI IND STRL 7.0 (GLOVE) IMPLANT
GOWN STRL REUS W/ TWL LRG LVL3 (GOWN DISPOSABLE) IMPLANT
GOWN STRL REUS W/ TWL XL LVL3 (GOWN DISPOSABLE) ×4 IMPLANT
GOWN STRL REUS W/TWL 2XL LVL3 (GOWN DISPOSABLE) IMPLANT
GOWN STRL REUS W/TWL LRG LVL3 (GOWN DISPOSABLE)
GOWN STRL REUS W/TWL XL LVL3 (GOWN DISPOSABLE) ×2
HEMOSTAT POWDER KIT SURGIFOAM (HEMOSTASIS) ×2 IMPLANT
KIT BASIN OR (CUSTOM PROCEDURE TRAY) ×2 IMPLANT
KIT GRAFTMAG DEL NEURO DISP (NEUROSURGERY SUPPLIES) IMPLANT
KIT POSITION SURG JACKSON T1 (MISCELLANEOUS) ×2 IMPLANT
KIT TURNOVER KIT B (KITS) ×2 IMPLANT
MILL BONE PREP (MISCELLANEOUS) ×2 IMPLANT
NDL HYPO 25X1 1.5 SAFETY (NEEDLE) ×2 IMPLANT
NEEDLE HYPO 25X1 1.5 SAFETY (NEEDLE) ×1 IMPLANT
NS IRRIG 1000ML POUR BTL (IV SOLUTION) ×2 IMPLANT
PACK LAMINECTOMY NEURO (CUSTOM PROCEDURE TRAY) ×2 IMPLANT
PAD ARMBOARD 7.5X6 YLW CONV (MISCELLANEOUS) ×6 IMPLANT
ROD 60MM LUMBAR (Rod) IMPLANT
SCREW LOCK (Screw) ×6 IMPLANT
SCREW LOCK FXNS SPNE MAS PL (Screw) IMPLANT
SCREW SHANK 5.5X40MM (Screw) IMPLANT
SCREW TULIP 5.5 (Screw) IMPLANT
SOL ELECTROSURG ANTI STICK (MISCELLANEOUS)
SOLUTION ELECTROSURG ANTI STCK (MISCELLANEOUS) ×2 IMPLANT
SOLUTION IRRIG SURGIPHOR (IV SOLUTION) ×2 IMPLANT
SPONGE SURGIFOAM ABS GEL 100 (HEMOSTASIS) ×2 IMPLANT
SPONGE T-LAP 4X18 ~~LOC~~+RFID (SPONGE) IMPLANT
STRIP CLOSURE SKIN 1/2X4 (GAUZE/BANDAGES/DRESSINGS) ×4 IMPLANT
SUT VIC AB 0 CT1 18XCR BRD8 (SUTURE) ×2 IMPLANT
SUT VIC AB 0 CT1 8-18 (SUTURE) ×1
SUT VIC AB 2-0 CP2 18 (SUTURE) ×2 IMPLANT
SUT VIC AB 3-0 SH 8-18 (SUTURE) ×4 IMPLANT
SYR CONTROL 10ML LL (SYRINGE) ×2 IMPLANT
TOWEL GREEN STERILE (TOWEL DISPOSABLE) ×2 IMPLANT
TOWEL GREEN STERILE FF (TOWEL DISPOSABLE) ×2 IMPLANT
TRAY FOLEY MTR SLVR 16FR STAT (SET/KITS/TRAYS/PACK) ×2 IMPLANT
WATER STERILE IRR 1000ML POUR (IV SOLUTION) ×2 IMPLANT

## 2022-06-22 NOTE — Progress Notes (Signed)
Pt's BP 189/73, 175/69 this am. Dr. Hart [place] is notified.

## 2022-06-22 NOTE — Transfer of Care (Signed)
Immediate Anesthesia Transfer of Care Note  Patient: Katrina Rivera  Procedure(s) Performed: Lumbar Three-Lumbar Four Posterior Lumbar Interbody Fusion (Back)  Patient Location: PACU  Anesthesia Type:General  Level of Consciousness: awake and oriented  Airway & Oxygen Therapy: Patient Spontanous Breathing and Patient connected to nasal cannula oxygen  Post-op Assessment: Report given to RN  Post vital signs: Reviewed and stable  Last Vitals:  Vitals Value Taken Time  BP 144/71 06/22/22 1520  Temp    Pulse 76 06/22/22 1523  Resp 24 06/22/22 1523  SpO2 94 % 06/22/22 1523  Vitals shown include unvalidated device data.  Last Pain:  Vitals:   06/22/22 1007  PainSc: 8          Complications: No notable events documented.

## 2022-06-22 NOTE — Anesthesia Postprocedure Evaluation (Signed)
Anesthesia Post Note  Patient: Katrina Rivera  Procedure(s) Performed: Lumbar Three-Lumbar Four Posterior Lumbar Interbody Fusion (Back)     Patient location during evaluation: PACU Anesthesia Type: General Level of consciousness: awake and alert Pain management: pain level controlled Vital Signs Assessment: post-procedure vital signs reviewed and stable Respiratory status: spontaneous breathing, nonlabored ventilation, respiratory function stable and patient connected to nasal cannula oxygen Cardiovascular status: blood pressure returned to baseline and stable Postop Assessment: no apparent nausea or vomiting Anesthetic complications: no  No notable events documented.  Last Vitals:  Vitals:   06/22/22 1600 06/22/22 1615  BP: (!) 155/53   Pulse: 78 82  Resp: 17 17  Temp:    SpO2: 100% 96%    Last Pain:  Vitals:   06/22/22 1545  PainSc: 5                  Shelton Silvas

## 2022-06-22 NOTE — Anesthesia Procedure Notes (Signed)
Procedure Name: Intubation Date/Time: 06/22/2022 12:36 PM  Performed by: De Nurse, CRNAPre-anesthesia Checklist: Patient identified, Emergency Drugs available, Suction available and Patient being monitored Patient Re-evaluated:Patient Re-evaluated prior to induction Oxygen Delivery Method: Circle System Utilized Preoxygenation: Pre-oxygenation with 100% oxygen Induction Type: IV induction Ventilation: Mask ventilation without difficulty Laryngoscope Size: Mac and 3 Grade View: Grade I Tube type: Oral Tube size: 7.0 mm Number of attempts: 1 Airway Equipment and Method: Stylet Placement Confirmation: ETT inserted through vocal cords under direct vision, positive ETCO2 and breath sounds checked- equal and bilateral Secured at: 21 cm Tube secured with: Tape Dental Injury: Teeth and Oropharynx as per pre-operative assessment  Comments: Preformed by Charlann Noss, SRNA - CRNA and MDA present

## 2022-06-22 NOTE — H&P (Signed)
Subjective: Patient is a 77 y.o. female admitted for adjacent level stenosis. Onset of symptoms was several months ago, gradually worsening since that time.  The pain is rated severe, and is located at the across the lower back and radiates to legs. The pain is described as aching and occurs all day. The symptoms have been progressive. Symptoms are exacerbated by exercise, standing, and walking for more than a few minutes. 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  Past Medical History:  Diagnosis Date   Addison's disease (HCC)    takes Solu Cortef daily   Anemia    takes Ferrous Sulfate daily   Anxiety    takes Xanax nightly   Arthritis    Chronic back pain    stenosis   Depression    takes Cymbalta daily   Dizziness    if b/p drops    Fibromyalgia    Fibromyalgia    History of blood transfusion    no abnormal reaction noted   History of bronchitis    many yrs ago    Hypokalemia    takes Potassium daily   Hypotension    Hypotension    takes Florinef daily   IBS (irritable bowel syndrome)    takes Align daily   Insomnia    takes Trazodone nightly   Joint pain    Multiple sclerosis (HCC)    doesn't take any meds   Multiple sclerosis (HCC)    New onset headache 12/22/2021   Nocturia    Osteoporosis    Palpitations    Peripheral neuropathy    Primary localized osteoarthritis of left knee 08/11/2020   Restless leg syndrome    Seasonal allergies    takes Zyrtec daily;uses Flonase daily as needed   Syncope    when missed methylprednisolone dose   Urinary frequency    takes Flomax daily   Weakness    numbness and tingling    Past Surgical History:  Procedure Laterality Date   ABDOMINAL HYSTERECTOMY  02/07/1986   APPENDECTOMY  02/07/1986   CESAREAN SECTION  1973/1977   x2   CHOLECYSTECTOMY  02/08/1995   COLECTOMY  02/08/1988   COLONOSCOPY     ESOPHAGOGASTRODUODENOSCOPY     EYE SURGERY     bilateral - /w IOL- cataracts    FRACTURE SURGERY Right    rods and screws-right leg   LUMBAR LAMINECTOMY/DECOMPRESSION MICRODISCECTOMY Left 01/24/2013   Procedure: LUMBAR FIVE TO SACRAL ONE LUMBAR LAMINECTOMY/DECOMPRESSION MICRODISCECTOMY 1 LEVEL;  Surgeon: Tia Alert, MD;  Location: [facility];  Service: Neurosurgery;  Laterality: Left;   MAXIMUM ACCESS (MAS)POSTERIOR LUMBAR INTERBODY FUSION (PLIF) 1 LEVEL N/A 06/18/2014   Procedure: MAXIMUM ACCESS SURGERY POSTERIOR LUMBAR INTERBODY FUSION LUMBAR FIVE TO SACRAL ONE ;  Surgeon: Tia Alert, MD;  Location: [facility];  Service: Neurosurgery;  Laterality: N/A;   SPINAL CORD STIMULATOR BATTERY EXCHANGE Right 07/30/2021   Procedure: Spinal cord stimulator battery replacement;  Surgeon: Tia Alert, MD;  [facility];  Service: Neurosurgery;  Laterality: Right;   SPINAL CORD STIMULATOR IMPLANT     TOTAL KNEE ARTHROPLASTY Left 08/24/2020   Procedure: TOTAL KNEE ARTHROPLASTY;  Surgeon: Salvatore Marvel, MD;  Location: [facility];  Service: Orthopedics;  Laterality: Left;    Prior to Admission medications   Medication Sig Start Date End Date Taking? Authorizing Provider  acetaminophen (TYLENOL) 500 MG tablet Take 1,000 mg by mouth every 6 (six) hours as needed for moderate pain.  Yes [provider]  acetaminophen (TYLENOL) 650 MG CR tablet Take 650 mg by mouth every 8 (eight) hours as needed for pain.   Yes [provider]  alfuzosin (UROXATRAL) 10 MG 24 hr tablet Take 10 mg by mouth daily with breakfast.   Yes [provider]  Ascorbic Acid (VITAMIN C PO) Take 1 tablet by mouth daily.   Yes [provider]  B Complex Vitamins (VITAMIN B COMPLEX PO) Take 1 capsule by mouth in the morning.   Yes [provider]  cephALEXin (KEFLEX) 250 MG capsule Take 250 mg by mouth every other day. 06/04/22  Yes [provider]  cholecalciferol (VITAMIN D3) 25 MCG (1000 UNIT) tablet Take 1,000 Units by mouth daily.   Yes [provider]  clonazePAM (KLONOPIN) 0.5 MG tablet Take 1 tablet (0.5 mg total) by mouth at bedtime as needed for anxiety. Patient taking differently: Take 0.25 mg by mouth at bedtime. 05/17/22  Yes Levert Feinstein, MD  CRANBERRY PO Take 1 tablet by mouth 2 (two) times daily.   Yes [provider]  dicyclomine (BENTYL) 10 MG capsule Take 10 mg by mouth 3 (three) times daily.   Yes [provider]  DULoxetine (CYMBALTA) 60 MG capsule Take 1 capsule (60 mg total) by mouth daily. 05/17/22  Yes Levert Feinstein, MD  ferrous sulfate 325 (65 FE) MG tablet Take 325 mg by mouth daily.   Yes [provider]  fludrocortisone (FLORINEF) 0.1 MG tablet Take 1 tablet daily 04/20/22  Yes Reather Littler, MD  fluticasone (CUTIVATE) 0.05 % cream Apply 1 application  topically 2 (two) times daily as needed for irritation. 06/09/21  Yes [provider]  fluticasone (FLONASE) 50 MCG/ACT nasal spray Place 1 spray into both nostrils daily as needed for allergies or rhinitis.   Yes [provider]  gabapentin (NEURONTIN) 300 MG capsule Take 2-3 capsules (600-900 mg total) by mouth at bedtime. Patient taking differently: Take 900 mg by mouth at bedtime. 05/17/22  Yes Levert Feinstein, MD  HORIZANT 600 MG TBCR TAKE ONE TABLET BY MOUTH AT BEDTIME 12/13/21  Yes Levert Feinstein, MD  methylPREDNISolone (MEDROL) 4 MG tablet TAKE 1 TABLET (4MG ) AT BREAKFAST AND 1/2 TABLET (2MG ) AT 4PM. 04/20/22  Yes Reather Littler, MD  Multiple Vitamin (MULTIVITAMIN) tablet Take 1 tablet by mouth in the morning.   Yes [provider]  OVER THE COUNTER MEDICATION Take 1 capsule by mouth at bedtime. Sleep 3   Yes [provider]  Polyethyl Glycol-Propyl Glycol (SYSTANE OP) Place 2 drops into both eyes daily as needed (for dry eyes).   Yes [provider]  polyethylene glycol (MIRALAX) 17 g packet Take 17 g by mouth 2 (two) times daily. 17 grams in 6 oz of favorite drink twice a day until bowel movement.  LAXITIVE.   Restart if two days since last bowel movement Patient taking differently: Take 17 g by mouth daily as needed for moderate constipation. 08/24/20  Yes Shepperson, Kirstin, PA-C  Probiotic Product (PROBIOTIC PO) Take 1 capsule by mouth daily.   Yes [provider]  promethazine (PHENERGAN) 25 MG tablet Take 25 mg by mouth every 6 (six) hours as needed for nausea or vomiting.   Yes [provider]  propranolol (INDERAL) 10 MG tablet Take 10 mg by mouth 3 (three) times daily as needed (anxiety/palpitations). Currently taking it twice a day   Yes [provider]  tiZANidine (ZANAFLEX) 2 MG tablet Take 2 tablets (4  mg total) by mouth at bedtime. 03/15/22  Yes Glean Salvo, NP  Erenumab-aooe (AIMOVIG) 70 MG/ML SOAJ Inject 70 mg into the skin every 30 (thirty) days. 06/07/22   Levert Feinstein, MD  Rimegepant Sulfate (NURTEC) 75 MG TBDP Take 1 tab at onset of migraine.  May repeat in 2 hrs, if needed.  Max dose: 2 tabs/day. This is a 30 day prescription. 06/07/22   Levert Feinstein, MD   Allergies  Allergen Reactions   Amoxicillin Other (See Comments)    Upset stomach   Aspirin     bruising   Azithromycin     stomach upset   Doxycycline Hyclate     GI upset   Buspirone Hcl Palpitations   Mirtazapine Palpitations    Weight gain    Social History   Tobacco Use   Smoking status: Never   Smokeless tobacco: Never  Substance Use Topics   Alcohol use: Never    Family History  Problem Relation Age of Onset   Hypertension Mother    Stroke Mother    Heart attack Father    Tremor Brother      Review of Systems  Positive ROS: neg  All other systems have been reviewed and were otherwise negative with the exception of those mentioned in the HPI and as above.  Objective: Vital signs in last 24 hours: Temp:  [97.6 F (36.4 C)] 97.6 F (36.4 C) (05/15 0933) Pulse Rate:  [65] 65 (05/15 0933) Resp:  [18] 18 (05/15 0933) BP: (175-189)/(69-73) 175/69 (05/15 1007) SpO2:  [99 %] 99  % (05/15 0933) Weight:  [51.7 kg] 51.7 kg (05/15 0933)  General Appearance: Alert, cooperative, no distress, appears stated age Head: Normocephalic, without obvious abnormality, atraumatic Eyes: PERRL, conjunctiva/corneas clear, EOM's intact    Neck: Supple, symmetrical, trachea midline Back: Symmetric, no curvature, ROM normal, no CVA tenderness Lungs:  respirations unlabored Heart: Regular rate and rhythm Abdomen: Soft, non-tender Extremities: Extremities normal, atraumatic, no cyanosis or edema Pulses: 2+ and symmetric all extremities Skin: Skin color, texture, turgor normal, no rashes or lesions  NEUROLOGIC:   Mental status: Alert and oriented x4,  no aphasia, good attention span, fund of knowledge, and memory Motor Exam - grossly normal Sensory Exam - grossly normal Reflexes: 1+ Coordination - grossly normal Gait - grossly normal Balance - grossly normal Cranial Nerves: I: smell Not tested  II: visual acuity  OS: nl    OD: nl  II: visual fields Full to confrontation  II: pupils Equal, round, reactive to light  III,VII: ptosis None  III,IV,VI: extraocular muscles  Full ROM  V: mastication Normal  V: facial light touch sensation  Normal  V,VII: corneal reflex  Present  VII: facial muscle function - upper  Normal  VII: facial muscle function - lower Normal  VIII: hearing Not tested  IX: soft palate elevation  Normal  IX,X: gag reflex Present  XI: trapezius strength  5/5  XI: sternocleidomastoid strength 5/5  XI: neck flexion strength  5/5  XII: tongue strength  Normal    Data Review Lab Results  Component Value Date   WBC 7.9 06/09/2022   HGB 13.3 06/09/2022   HCT 40.9 06/09/2022   MCV 101.7 (H) 06/09/2022   PLT 222 06/09/2022   Lab Results  Component Value Date   NA 141 06/09/2022   K 4.4 06/09/2022   CL 100 06/09/2022   CO2 29 06/09/2022   BUN 25 (H) 06/09/2022   CREATININE 1.05 (H) 06/09/2022  GLUCOSE 88 06/09/2022   Lab Results  Component Value  Date   INR 0.9 06/09/2022    Assessment/Plan:  Estimated body mass index is 22.26 kg/m as calculated from the following:   Height as of this encounter: 5' (1.524 m).   Weight as of this encounter: 51.7 kg. Patient admitted for PLIF L3-4. Patient has failed a reasonable attempt at conservative therapy.  I explained the condition and procedure to the patient and answered any questions.  Patient wishes to proceed with procedure as planned. Understands risks/ benefits and typical outcomes of procedure.   Tia Alert 06/22/2022 11:36 AM

## 2022-06-22 NOTE — Op Note (Signed)
06/22/2022  3:19 PM  PATIENT:  Katrina Rivera  77 y.o. 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 Decompressive lumbar laminectomy, hemi facetectomy and foraminotomies L3-4 requiring more work than would be required for a simple exposure of the disk for PLIF in order to adequately decompress the neural elements and address the spinal stenosis 2. Posterior lumbar interbody fusion L3-4 using peek interbody cages packed with morcellized allograft and autograft  3. Posterior fixation L3-5 using NuVasive cortical pedicle screws.  4. Intertransverse arthrodesis L3-4 right using morcellized autograft and allograft. 5.  Removal of nonsegmental instrumentation L4-5  SURGEON:  Marikay Alar, MD  ASSISTANTS: Verlin Dike, FNP  ANESTHESIA:  General  EBL: 200 ml  Total I/O In: 250 [IV Piggyback:250] Out: 600 [Urine:400; Blood:200]  BLOOD ADMINISTERED:none  DRAINS: none   INDICATION FOR PROCEDURE: This patient presented with back and bilateral leg pain. Imaging revealed arch herniated disc with a large superior free fragment spinal stenosis at L3-4 above her previous L4-5 and L5-S1 fusion. The patient tried a reasonable attempt at conservative medical measures without relief. I recommended decompression and instrumented fusion to address the stenosis as well as the segmental  instability.  Patient understood the risks, benefits, and alternatives and potential outcomes and wished to proceed.  PROCEDURE DETAILS:  The patient was brought to the operating room. After induction of generalized endotracheal anesthesia the patient was rolled into the prone position on chest rolls and all pressure points were padded. The patient's lumbar region was cleaned and then prepped with DuraPrep and draped in the usual sterile fashion. Anesthesia was injected and then a dorsal  midline incision was made and carried down to the lumbosacral fascia. The fascia was opened and the paraspinous musculature was taken down in a subperiosteal fashion to expose L3-4 as well as the previously placed instrumentation at L4-5. A self-retaining retractor was placed. Intraoperative fluoroscopy confirmed my level, and I started with removal of the previous instrumentation.  The locking caps were removed at L4-5 and the rods were removed.  The screws had good purchase.  I then started with placement of the L3 cortical pedicle screws. The pedicle screw entry zones were identified utilizing surface landmarks and  AP and lateral fluoroscopy. I scored the cortex with the high-speed drill and then used the hand drill to drill an upward and outward direction into the pedicle. I then tapped line to line. I then placed a 5.5 x 40 mm cortical pedicle screw into the pedicles of L3 bilaterally.    I then turned my attention to the decompression and complete lumbar laminectomies, hemi- facetectomies, and foraminotomies were performed at L3-4.  My nurse practitioner was directly involved in the decompression and exposure of the neural elements. the patient had significant spinal stenosis and this required more work than would be required for a simple exposure of the disc for posterior lumbar interbody fusion which would only require a limited laminotomy. Much more generous decompression and generous foraminotomy was undertaken in order to adequately decompress the neural elements and address the patient's leg pain. The yellow ligament was removed to expose the underlying dura and nerve roots, and generous foraminotomies were performed to adequately decompress the neural elements. Both the exiting and traversing nerve roots were decompressed on both sides until a coronary dilator passed easily along the nerve roots. Once the decompression was complete, I turned my  attention to the posterior lower lumbar interbody  fusion. The epidural venous vasculature was coagulated and cut sharply. Disc space was incised and the initial discectomy was performed with pituitary rongeurs. The disc space was distracted with sequential distractors to a height of 10 mm. We then used a series of scrapers and shavers to prepare the endplates for fusion. The midline was prepared with Epstein curettes. Once the complete discectomy was finished, we packed an appropriate sized interbody cage with local autograft and morcellized allograft, gently retracted the nerve root, and tapped the cage into position at L3-4.  The midline between the cages was packed with morselized autograft and allograft.    We then decorticated the transverse processes and laid a mixture of morcellized autograft and allograft out over these to perform intertransverse arthrodesis at L3-4 on the right. We then placed lordotic rods into the multiaxial screw heads of the pedicle screws L3-L5 and locked these in position with the locking caps and anti-torque device. We then checked our construct with AP and lateral fluoroscopy. Irrigated with copious amounts of 0.5% povidone iodine solution followed by saline solution. Inspected the nerve roots once again to assure adequate decompression, lined to the dura with Gelfoam,  and then we closed the muscle and the fascia with 0 Vicryl. Closed the subcutaneous tissues with 2-0 Vicryl and subcuticular tissues with 3-0 Vicryl. The skin was closed with benzoin and Steri-Strips. Dressing was then applied, the patient was awakened from general anesthesia and transported to the recovery room in stable condition. At the end of the procedure all sponge, needle and instrument counts were correct.   PLAN OF CARE: admit to inpatient  PATIENT DISPOSITION:  PACU - hemodynamically stable.   Delay start of Pharmacological VTE agent (>24hrs) due to surgical blood loss or risk of bleeding:  yes

## 2022-06-23 ENCOUNTER — Other Ambulatory Visit: Payer: Self-pay

## 2022-06-23 MED ORDER — TIZANIDINE HCL 2 MG PO TABS: 4.0000 mg | ORAL_TABLET | Freq: Three times a day (TID) | ORAL | 1 refills | Status: AC | PRN

## 2022-06-23 MED ORDER — FERROUS SULFATE 325 (65 FE) MG PO TABS
325.0000 mg | ORAL_TABLET | Freq: Every day | ORAL | Status: DC
  Administered 2022-06-23: 325 mg via ORAL
  Filled 2022-06-23: qty 1

## 2022-06-23 MED ORDER — ALFUZOSIN HCL ER 10 MG PO TB24
10.0000 mg | ORAL_TABLET | Freq: Every day | ORAL | Status: DC
  Administered 2022-06-23: 10 mg via ORAL
  Filled 2022-06-23: qty 1

## 2022-06-23 MED ORDER — HYDROCODONE-ACETAMINOPHEN 7.5-325 MG PO TABS: 1.0000 | ORAL_TABLET | ORAL | 0 refills | Status: DC | PRN

## 2022-06-23 NOTE — Plan of Care (Signed)
  Problem: Education: Goal: Ability to verbalize activity precautions or restrictions will improve Outcome: Completed/Met Goal: Knowledge of the prescribed therapeutic regimen will improve Outcome: Completed/Met Goal: Understanding of discharge needs will improve Outcome: Completed/Met   Problem: Activity: Goal: Ability to avoid complications of mobility impairment will improve Outcome: Completed/Met Goal: Ability to tolerate increased activity will improve Outcome: Completed/Met Goal: Will remain free from falls Outcome: Completed/Met   Problem: Bowel/Gastric: Goal: Gastrointestinal status for postoperative course will improve Outcome: Completed/Met   Problem: Clinical Measurements: Goal: Ability to maintain clinical measurements within normal limits will improve Outcome: Completed/Met Goal: Postoperative complications will be avoided or minimized Outcome: Completed/Met Goal: Diagnostic test results will improve Outcome: Completed/Met   Problem: Pain Management: Goal: Pain level will decrease Outcome: Completed/Met   Problem: Skin Integrity: Goal: Will show signs of wound healing Outcome: Completed/Met   Problem: Health Behavior/Discharge Planning: Goal: Identification of resources available to assist in meeting health care needs will improve Outcome: Completed/Met   Problem: Bladder/Genitourinary: Goal: Urinary functional status for postoperative course will improve Outcome: Completed/Met Patient alert and oriented, void, ambulate, surgical dressing changed. D/c instructions explain and givne all questions answered. Pt. D/c home per order.

## 2022-06-23 NOTE — Evaluation (Signed)
Physical Therapy Evaluation  Patient Details Name: Katrina Rivera MRN: [id number] DOB: 1945-05-01 Today's Date: 06/23/2022  History of Present Illness  Pt is a 77 y/o female presenting on 5/15 for same day L3-4 PLIF. PMH: addison's disease, anemia, anxiety, arthritis, chronic back pain, depression, fibromyalgia, hypotension, IBS, MS, peripheral neuropathy, syncope, spinal cord stimulator, prior back surgery.   Clinical Impression  Pt admitted with above diagnosis. At the time of PT eval, pt was able to demonstrate transfers and ambulation with gross min guard assist and RW for support. Pt with flexed knees throughout mobility, appears weak but likely near baseline. Pt was educated on precautions, brace application/wearing schedule, appropriate activity progression, and car transfer. Pt currently with functional limitations due to the deficits listed below (see PT Problem List). Pt will benefit from skilled PT to increase their independence and safety with mobility to allow discharge to the venue listed below.         Recommendations for follow up therapy are one component of a multi-disciplinary discharge planning process, led by the attending physician.  Recommendations may be updated based on patient status, additional functional criteria and insurance authorization.  Follow Up Recommendations       Assistance Recommended at Discharge PRN  Patient can return home with the following  A little help with walking and/or transfers;A little help with bathing/dressing/bathroom;Assistance with cooking/housework;Assist for transportation;Help with stairs or ramp for entrance    Equipment Recommendations None recommended by PT  Recommendations for Other Services       Functional Status Assessment Patient has had a recent decline in their functional status and demonstrates the ability to make significant improvements in function in a reasonable and predictable amount of time.     Precautions /  Restrictions Precautions Precautions: Fall;Back Precaution Booklet Issued: Yes (comment) Precaution Comments: Reviewed handout and pt was cued for precautions during functional mobility. Required Braces or Orthoses: Spinal Brace Spinal Brace: Lumbar corset;Applied in sitting position Restrictions Weight Bearing Restrictions: No      Mobility  Bed Mobility Overal bed mobility: Needs Assistance Bed Mobility: Rolling, Sidelying to Sit, Sit to Sidelying Rolling: Min guard Sidelying to sit: Min guard     Sit to sidelying: Min guard General bed mobility comments: HOB flat and rails lowered to simulate home environment. No assist required but hands on guiding for optimal log roll technique.    Transfers Overall transfer level: Needs assistance Equipment used: Rolling walker (2 wheels) Transfers: Sit to/from Stand Sit to Stand: Min guard           General transfer comment: VC's for hand placement on seated surface for safety. No assist required but hands on guarding provided for safety.    Ambulation/Gait Ambulation/Gait assistance: Min guard Gait Distance (Feet): 250 Feet Assistive device: Rolling walker (2 wheels) Gait Pattern/deviations: Step-through pattern, Decreased stride length, Trunk flexed, Knee flexed in stance - right, Knee flexed in stance - left Gait velocity: Decreased Gait velocity interpretation: <1.8 ft/sec, indicate of risk for recurrent falls   General Gait Details: Pt with flexed knees throughout OOB mobility. No assist required but hands on throughout.  Stairs            Wheelchair Mobility    Modified Rankin (Stroke Patients Only)       Balance Overall balance assessment: Needs assistance Sitting-balance support: No upper extremity supported, Feet supported Sitting balance-Leahy Scale: Fair Sitting balance - Comments: limited dynamically   Standing balance support: Bilateral upper extremity supported, During functional  activity Standing balance-Leahy Scale: Poor Standing balance comment: relies on BUE and external support                             Pertinent Vitals/Pain Pain Assessment Pain Assessment: Faces Faces Pain Scale: Hurts little more Pain Location: back-incisional Pain Descriptors / Indicators: Discomfort, Operative site guarding Pain Intervention(s): Limited activity within patient's tolerance, Monitored during session, Repositioned    Home Living Family/patient expects to be discharged to:: Private residence Living Arrangements: Spouse/significant other Available Help at Discharge: Family;Available 24 hours/day Type of Home: House Home Access: Stairs to enter Entrance Stairs-Rails: None Entrance Stairs-Number of Steps: 1   Home Layout: One level Home Equipment: Agricultural consultant (2 wheels);Rollator (4 wheels);Shower seat;Grab bars - toilet;BSC/3in1;Wheelchair - manual      Prior Function Prior Level of Function : Needs assist             Mobility Comments: reports using walker at baseline without assist ADLs Comments: reports independnet with ALDs and iADLs     Hand Dominance   Dominant Hand: Right    Extremity/Trunk Assessment   Upper Extremity Assessment Upper Extremity Assessment: Generalized weakness    Lower Extremity Assessment Lower Extremity Assessment: Generalized weakness    Cervical / Trunk Assessment Cervical / Trunk Assessment: Back Surgery  Communication   Communication: No difficulties  Cognition Arousal/Alertness: Awake/alert Behavior During Therapy: WFL for tasks assessed/performed Overall Cognitive Status: No family/caregiver present to determine baseline cognitive functioning Area of Impairment: Memory, Problem solving                     Memory: Decreased recall of precautions, Decreased short-term memory       Problem Solving: Slow processing, Requires verbal cues General Comments: Pt reports decreased STM is her  age.        General Comments General comments (skin integrity, edema, etc.): pt educated on importance of having assist for all ADls and mobility from spouse due to instability, pt voiced understanding. Educated on importance of mobility about every 45 minutes during the day, as pt reports plan to spend most of her time in the bed.    Exercises     Assessment/Plan    PT Assessment Patient needs continued PT services  PT Problem List Decreased strength;Decreased activity tolerance;Decreased balance;Decreased mobility;Decreased knowledge of use of DME;Decreased safety awareness;Decreased knowledge of precautions;Pain       PT Treatment Interventions DME instruction;Gait training;Functional mobility training;Therapeutic activities;Therapeutic exercise;Balance training;Patient/family education    PT Goals (Current goals can be found in the Care Plan section)  Acute Rehab PT Goals Patient Stated Goal: Home today PT Goal Formulation: With patient Time For Goal Achievement: 06/30/22 Potential to Achieve Goals: Good    Frequency Min 5X/week     Co-evaluation               AM-PAC PT "6 Clicks" Mobility  Outcome Measure Help needed turning from your back to your side while in a flat bed without using bedrails?: A Little Help needed moving from lying on your back to sitting on the side of a flat bed without using bedrails?: A Little Help needed moving to and from a bed to a chair (including a wheelchair)?: A Little Help needed standing up from a chair using your arms (e.g., wheelchair or bedside chair)?: A Little Help needed to walk in hospital room?: A Little Help needed climbing 3-5 steps with a railing? : A  Little 6 Click Score: 18    End of Session Equipment Utilized During Treatment: Gait belt;Back brace Activity Tolerance: Patient tolerated treatment well Patient left: in bed;with call bell/phone within reach Nurse Communication: Mobility status PT Visit Diagnosis:  Unsteadiness on feet (R26.81);Pain Pain - part of body:  (back)    Time: 1610-9604 PT Time Calculation (min) (ACUTE ONLY): 20 min   Charges:   PT Evaluation $PT Eval Low Complexity: 1 Low          Conni Slipper, PT, DPT Acute Rehabilitation Services Secure Chat Preferred Office: [phone]   Marylynn Pearson 06/23/2022, 11:45 AM

## 2022-06-23 NOTE — Evaluation (Signed)
Occupational Therapy Evaluation Patient Details Name: Katrina Rivera MRN: [id number] DOB: 1945-04-06 Today's Date: 06/23/2022   History of Present Illness Pt is a 77 y/o female presenting on 5/15 for same day L3-4 PLIF. PMH: addison's disease, anemia, anxiety, arthritis, chronic back pain, depression, fibromyalgia, hypotension, IBS, MS, peripheral neuropathy, syncope, spinal cord stimulator, prior back surgery.   Clinical Impression   Patient admitted for above and presents with problem list below. She reports PTA being independent using walker for mobility, ADLs and IADLs (not driving).  Patient was educated on brace mgmt and wear schedule, back precautions, ADL compensatory techniques, AE/DME, mobility progression, safety and recommendations.  Today, pt demonstrated ability to complete bed mobility with up to min assist, transfers using RW with min fading to min guard assist, functional mobility using RW with min guard to min assist, and ADLs with up to mod assist for LB.  Educated on safety with assist from spouse for ADLs and hands on assist with mobility and transfers at this time- pt voiced understanding and agreeable to recommendations. Based on performance today, believe she will benefit from further OT services acutely but anticipate no further needs after dc home (as she has good support from spouse).       Recommendations for follow up therapy are one component of a multi-disciplinary discharge planning process, led by the attending physician.  Recommendations may be updated based on patient status, additional functional criteria and insurance authorization.   Assistance Recommended at Discharge Frequent or constant Supervision/Assistance  Patient can return home with the following A little help with walking and/or transfers;A lot of help with bathing/dressing/bathroom;Assistance with cooking/housework;Assist for transportation;Help with stairs or ramp for entrance    Functional  Status Assessment  Patient has had a recent decline in their functional status and demonstrates the ability to make significant improvements in function in a reasonable and predictable amount of time.  Equipment Recommendations  None recommended by OT (pt plans to self purchase bed rail and tub transfer bench)    Recommendations for Other Services PT consult     Precautions / Restrictions Precautions Precautions: Fall;Back Precaution Booklet Issued: Yes (comment) Precaution Comments: reviewed with pt and requires min cueing to adhere funcitonally Required Braces or Orthoses: Spinal Brace Spinal Brace: Lumbar corset;Applied in sitting position Restrictions Weight Bearing Restrictions: No      Mobility Bed Mobility Overal bed mobility: Needs Assistance Bed Mobility: Rolling, Sidelying to Sit, Sit to Sidelying Rolling: Min guard Sidelying to sit: Min guard     Sit to sidelying: Min assist General bed mobility comments: min guard for technique with HOB flat and using rail, min assist to fully bring LEs back to bed    Transfers Overall transfer level: Needs assistance Equipment used: Rolling walker (2 wheels) Transfers: Sit to/from Stand Sit to Stand: Min assist, Min guard           General transfer comment: min assist initally to power up, fading to min guard with increased practice.  cueing for hand placmenet and posture, safety      Balance Overall balance assessment: Needs assistance Sitting-balance support: No upper extremity supported, Feet supported Sitting balance-Leahy Scale: Fair Sitting balance - Comments: limited dynamically   Standing balance support: Bilateral upper extremity supported, During functional activity Standing balance-Leahy Scale: Poor Standing balance comment: relies on BUE and external support  ADL either performed or assessed with clinical judgement   ADL Overall ADL's : Needs assistance/impaired      Grooming: Min guard;Standing Grooming Details (indicate cue type and reason): pt reports plan to complete grooming sitting at home for safety     Lower Body Bathing: Moderate assistance;Sit to/from stand Lower Body Bathing Details (indicate cue type and reason): B LEs Upper Body Dressing : Sitting;Supervision/safety Upper Body Dressing Details (indicate cue type and reason): brace mgmt cueing Lower Body Dressing: Moderate assistance;Sit to/from stand Lower Body Dressing Details (indicate cue type and reason): requires assist for threading pants, unable to complete figure 4 technique.  spouse will assist per pt Toilet Transfer: Minimal assistance;Rolling walker (2 wheels);Ambulation         Tub/Shower Transfer Details (indicate cue type and reason): pt reports spouse assists, discussed not reaching to grabbar inside tub when transferring. educated on recommendations for TTB. Functional mobility during ADLs: Min guard;Minimal assistance;Rolling walker (2 wheels);Cueing for safety General ADL Comments: pt educated on back precautions and safety, compensatory techniques, DME and recommendations.     Vision         Perception     Praxis      Pertinent Vitals/Pain Pain Assessment Pain Assessment: Faces Faces Pain Scale: Hurts little more Pain Location: back-incisional Pain Descriptors / Indicators: Discomfort, Operative site guarding Pain Intervention(s): Limited activity within patient's tolerance, Monitored during session, Repositioned     Hand Dominance Right   Extremity/Trunk Assessment Upper Extremity Assessment Upper Extremity Assessment: Generalized weakness   Lower Extremity Assessment Lower Extremity Assessment: Defer to PT evaluation   Cervical / Trunk Assessment Cervical / Trunk Assessment: Back Surgery   Communication Communication Communication: No difficulties   Cognition Arousal/Alertness: Awake/alert Behavior During Therapy: WFL for tasks  assessed/performed Overall Cognitive Status: No family/caregiver present to determine baseline cognitive functioning Area of Impairment: Memory, Problem solving                     Memory: Decreased recall of precautions, Decreased short-term memory       Problem Solving: Slow processing, Requires verbal cues General Comments: patient demonstrating some difficulty with recall of hand placement during transfers, back precautions and brace mgmt. anticipate near baseline .     General Comments  pt educated on importance of having assist for all ADls and mobility from spouse due to instability, pt voiced understanding. Educated on importance of mobility about every 45 minutes during the day, as pt reports plan to spend most of her time in the bed.    Exercises     Shoulder Instructions      Home Living Family/patient expects to be discharged to:: Private residence Living Arrangements: Spouse/significant other Available Help at Discharge: Family;Available 24 hours/day Type of Home: House Home Access: Stairs to enter [employer] of Steps: 1 Entrance Stairs-Rails: None Home Layout: One level     Bathroom Shower/Tub: Chief Strategy Officer: Standard     Home Equipment: Agricultural consultant (2 wheels);Rollator (4 wheels);Shower seat;Grab bars - toilet;BSC/3in1;Wheelchair - manual          Prior Functioning/Environment Prior Level of Function : Needs assist             Mobility Comments: reports using walker at baseline without assist ADLs Comments: reports independnet with ALDs and iADLs        OT Problem List: Decreased strength;Decreased activity tolerance;Impaired balance (sitting and/or standing);Decreased safety awareness;Decreased knowledge of use of DME or AE;Decreased knowledge of  precautions;Pain      OT Treatment/Interventions: Self-care/ADL training;Therapeutic exercise;DME and/or AE instruction;Therapeutic activities;Patient/family  education;Balance training    OT Goals(Current goals can be found in the care plan section) Acute Rehab OT Goals Patient Stated Goal: home OT Goal Formulation: With patient Time For Goal Achievement: 07/07/22 Potential to Achieve Goals: Good  OT Frequency: Min 2X/week    Co-evaluation              AM-PAC OT "6 Clicks" Daily Activity     Outcome Measure Help from another person eating meals?: None Help from another person taking care of personal grooming?: A Little Help from another person toileting, which includes using toliet, bedpan, or urinal?: A Little Help from another person bathing (including washing, rinsing, drying)?: A Lot Help from another person to put on and taking off regular upper body clothing?: A Little Help from another person to put on and taking off regular lower body clothing?: A Lot 6 Click Score: 17   End of Session Equipment Utilized During Treatment: Gait belt;Rolling walker (2 wheels);Back brace Nurse Communication: Mobility status  Activity Tolerance: Patient tolerated treatment well Patient left: in bed;with call bell/phone within reach  OT Visit Diagnosis: Other abnormalities of gait and mobility (R26.89);Muscle weakness (generalized) (M62.81);Pain Pain - part of body:  (back)                Time: 0865-7846 OT Time Calculation (min): 25 min Charges:  OT General Charges $OT Visit: 1 Visit OT Evaluation $OT Eval Low Complexity: 1 Low OT Treatments $Self Care/Home Management : 8-22 mins  Barry Brunner, OT Acute Rehabilitation Services Office [phone]   Chancy Milroy 06/23/2022, 10:26 AM

## 2022-06-23 NOTE — Discharge Summary (Signed)
Physician Discharge Summary  Patient ID: Katrina Rivera MRN: [id number] DOB/AGE: Apr 26, 1945 77 y.o.  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 The patient tolerated the procedure well and was taken to the recovery room and then to the floor in stable condition. The hospital course was routine. There were no complications. The wound remained clean dry and intact. Pt had appropriate back soreness. No complaints of leg pain or new N/T/W. The patient remained afebrile with stable vital signs, and tolerated a regular diet. The patient continued to increase activities, and pain was well controlled with oral pain medications.   Consults: None  Significant Diagnostic Studies:  Results for orders placed or performed during the hospital encounter of 06/09/22  Surgical pcr screen   Specimen: Nasal Mucosa; Nasal Swab  Result Value Ref Range   MRSA, PCR NEGATIVE NEGATIVE   Staphylococcus aureus NEGATIVE NEGATIVE  Protime-INR  Result Value Ref Range   Prothrombin Time 11.5 11.4 - 15.2 seconds   INR 0.9 0.8 - 1.2  Basic metabolic panel per protocol  Result Value Ref Range   Sodium 141 135 - 145 mmol/L   Potassium 4.4 3.5 - 5.1 mmol/L   Chloride 100 98 - 111 mmol/L   CO2 29 22 - 32 mmol/L   Glucose, Bld 88 70 - 99 mg/dL   BUN 25 (H) 8 - 23 mg/dL   Creatinine, Ser 4.09 (H) 0.44 - 1.00 mg/dL   Calcium 81.1 (H) 8.9 - 10.3 mg/dL   GFR, Estimated 56 (L) >60 mL/min   Anion gap 12 5 - 15  CBC per protocol  Result Value Ref Range   WBC 7.9 4.0 - 10.5 K/uL   RBC 4.02 3.87 - 5.11 MIL/uL   Hemoglobin 13.3 12.0 - 15.0 g/dL   HCT 91.4 78.2 - 95.6 %   MCV 101.7 (H) 80.0 - 100.0 fL   MCH 33.1  26.0 - 34.0 pg   MCHC 32.5 30.0 - 36.0 g/dL   RDW 21.3 08.6 - 57.8 %   Platelets 222 150 - 400 K/uL   nRBC 0.0 0.0 - 0.2 %  Type and screen [facility]  Result Value Ref Range   ABO/RH(D) A POS    Antibody Screen NEG    Sample Expiration 06/23/2022,2359    Extend sample reason      NO TRANSFUSIONS OR PREGNANCY IN THE PAST 3 MONTHS Performed at [facility], [address], [place], [place] [address]     DG Lumbar Spine 2-3 Views  Result Date: 06/22/2022 CLINICAL DATA:  [id number] Elective surgery [id number] EXAM: LUMBAR SPINE - 2-3 VIEW COMPARISON:  Lumbar spine CT 05/24/2022 FINDINGS: Intraoperative images during lumbar fusion extension to L3-L4, now L3 through L5. IMPRESSION: Intraoperative images during lumbar fusion extension to L3-L4. Electronically Signed   By: Caprice Renshaw M.D.   On: 06/22/2022 15:33   DG C-Arm 1-60 Min-No Report  Result Date: 06/22/2022 Fluoroscopy was utilized by the requesting physician.  No radiographic interpretation.   DG C-Arm 1-60 Min-No Report  Result Date: 06/22/2022 Fluoroscopy was utilized by the requesting physician.  No radiographic interpretation.   DG MYELOGRAPHY LUMBAR INJ LUMBOSACRAL  Result Date: 05/24/2022 CLINICAL DATA:  Lumbar radiculopathy.  Low back and bilateral lower extremity pain. EXAM: LUMBAR MYELOGRAM FLUOROSCOPY: Radiation Exposure Index (as provided by the fluoroscopic device): 8.90 mGy Kerma PROCEDURE: After thorough discussion of risks and benefits of the procedure including bleeding, infection, injury to nerves, blood vessels, adjacent structures as well as headache and CSF leak, written and oral informed consent was obtained. Consent was obtained by Dr. Sebastian Ache. Time out form was completed. Patient was positioned prone on the fluoroscopy table. Local anesthesia was provided with 1% lidocaine without epinephrine after prepped and draped in the usual sterile fashion. Puncture was performed at L2-3 using a 3 1/2  inch 22-gauge spinal needle via a right interlaminar approach. Using a single pass through the dura, the needle was placed within the thecal sac, with return of clear CSF. 15 mL of Isovue M-200 was injected into the thecal sac, with normal opacification of the nerve roots and cauda equina consistent with free flow within the subarachnoid space. I personally performed the lumbar puncture and administered the intrathecal contrast. I also personally supervised acquisition of the myelogram images. TECHNIQUE: Contiguous axial images were obtained through the Lumbar spine after the intrathecal infusion of contrast. Coronal and sagittal reconstructions were obtained of the axial image sets. COMPARISON:  Lumbar spine MRI 10/27/2021 FINDINGS: LUMBAR MYELOGRAM FINDINGS: There are 5 non rib-bearing lumbar type vertebrae. Vertebral alignment is normal, and no abnormal motion is evident on flexion or extension radiographs. Prior L4-S1 fusion is noted with wide patency of the thecal sac at these levels. Prominent ventral and dorsal extradural defects at L3-4 result in moderate to severe spinal stenosis with right greater than left L4 nerve root effacement. CT LUMBAR MYELOGRAM FINDINGS: Vertebral alignment is normal. No fracture or suspicious osseous lesion is identified. Sequelae of L3-S1 posterior and interbody fusion are again identified. Pedicle screws remain in place bilaterally at L4 and L5 without evidence of loosening. Old screw tracks are seen at S1. There is a small amount of bridging bone across the L5-S1 posterior elements, and there is at most scant bridging bone across the L4-5 and L5-S1 disc spaces. The conus medullaris terminates at L2. A spinal cord stimulator is partially visualized with leads coursing into the thoracic spine. Abdominal aortic atherosclerosis and prior cholecystectomy are noted. There is a large amount of stool in the rectum and distal colon. T12-L1: Negative. L1-2: A moderately large  central/right central disc extrusion has increased in size and demonstrates superior migration to the mid L1 vertebral body/pedicle level. No significant stenosis. L2-3: Mild facet and ligamentum flavum hypertrophy without disc herniation or stenosis, unchanged. L3-4: A new, very large right central to right subarticular disc extrusion with superior migration to the L3 pedicle level results in severe right lateral recess stenosis, potentially with right L3 and L4 nerve root impingement. A small left subarticular disc extrusion with mild superior migration is similar to the prior MRI and may result in left L3 nerve root impingement. The right-sided disc extrusion, right eccentric disc bulging, and moderate facet and ligamentum flavum hypertrophy result in moderate to severe spinal stenosis and moderate right and mild left neural foraminal stenosis. The right L3 nerve may also be affected in the extraforaminal region. L4-5: Prior posterior decompression and fusion. No significant residual stenosis. L5-S1: Prior posterior decompression and fusion. Mild left neural foraminal narrowing due to left-sided disc space height loss and endplate spurring. Widely patent spinal canal and right neural foramen. IMPRESSION: 1. New large right-sided disc extrusion at L3-4 contributing to severe right lateral recess stenosis,  moderate to severe spinal stenosis, and moderate right neural foraminal stenosis. 2. Increased size of a moderately large disc extrusion at L1-2 without significant stenosis. 3. Prior L4-S1 fusion without significant residual stenosis. 4.  Aortic Atherosclerosis (ICD10-I70.0). Electronically Signed   By: Sebastian Ache M.D.   On: 05/24/2022 13:59   CT LUMBAR SPINE W CONTRAST  Result Date: 05/24/2022 CLINICAL DATA:  Lumbar radiculopathy. Low back and bilateral lower extremity pain. EXAM: LUMBAR MYELOGRAM FLUOROSCOPY: Radiation Exposure Index (as provided by the fluoroscopic device): 8.90 mGy Kerma PROCEDURE:  After thorough discussion of risks and benefits of the procedure including bleeding, infection, injury to nerves, blood vessels, adjacent structures as well as headache and CSF leak, written and oral informed consent was obtained. Consent was obtained by Dr. Sebastian Ache. Time out form was completed. Patient was positioned prone on the fluoroscopy table. Local anesthesia was provided with 1% lidocaine without epinephrine after prepped and draped in the usual sterile fashion. Puncture was performed at L2-3 using a 3 1/2 inch 22-gauge spinal needle via a right interlaminar approach. Using a single pass through the dura, the needle was placed within the thecal sac, with return of clear CSF. 15 mL of Isovue M-200 was injected into the thecal sac, with normal opacification of the nerve roots and cauda equina consistent with free flow within the subarachnoid space. I personally performed the lumbar puncture and administered the intrathecal contrast. I also personally supervised acquisition of the myelogram images. TECHNIQUE: Contiguous axial images were obtained through the Lumbar spine after the intrathecal infusion of contrast. Coronal and sagittal reconstructions were obtained of the axial image sets. COMPARISON:  Lumbar spine MRI 10/27/2021 FINDINGS: LUMBAR MYELOGRAM FINDINGS: There are 5 non rib-bearing lumbar type vertebrae. Vertebral alignment is normal, and no abnormal motion is evident on flexion or extension radiographs. Prior L4-S1 fusion is noted with wide patency of the thecal sac at these levels. Prominent ventral and dorsal extradural defects at L3-4 result in moderate to severe spinal stenosis with right greater than left L4 nerve root effacement. CT LUMBAR MYELOGRAM FINDINGS: Vertebral alignment is normal. No fracture or suspicious osseous lesion is identified. Sequelae of L3-S1 posterior and interbody fusion are again identified. Pedicle screws remain in place bilaterally at L4 and L5 without evidence of  loosening. Old screw tracks are seen at S1. There is a small amount of bridging bone across the L5-S1 posterior elements, and there is at most scant bridging bone across the L4-5 and L5-S1 disc spaces. The conus medullaris terminates at L2. A spinal cord stimulator is partially visualized with leads coursing into the thoracic spine. Abdominal aortic atherosclerosis and prior cholecystectomy are noted. There is a large amount of stool in the rectum and distal colon. T12-L1: Negative. L1-2: A moderately large central/right central disc extrusion has increased in size and demonstrates superior migration to the mid L1 vertebral body/pedicle level. No significant stenosis. L2-3: Mild facet and ligamentum flavum hypertrophy without disc herniation or stenosis, unchanged. L3-4: A new, very large right central to right subarticular disc extrusion with superior migration to the L3 pedicle level results in severe right lateral recess stenosis, potentially with right L3 and L4 nerve root impingement. A small left subarticular disc extrusion with mild superior migration is similar to the prior MRI and may result in left L3 nerve root impingement. The right-sided disc extrusion, right eccentric disc bulging, and moderate facet and ligamentum flavum hypertrophy result in moderate to severe spinal stenosis and moderate right and mild left neural foraminal  stenosis. The right L3 nerve may also be affected in the extraforaminal region. L4-5: Prior posterior decompression and fusion. No significant residual stenosis. L5-S1: Prior posterior decompression and fusion. Mild left neural foraminal narrowing due to left-sided disc space height loss and endplate spurring. Widely patent spinal canal and right neural foramen. IMPRESSION: 1. New large right-sided disc extrusion at L3-4 contributing to severe right lateral recess stenosis, moderate to severe spinal stenosis, and moderate right neural foraminal stenosis. 2. Increased size of a  moderately large disc extrusion at L1-2 without significant stenosis. 3. Prior L4-S1 fusion without significant residual stenosis. 4.  Aortic Atherosclerosis (ICD10-I70.0). Electronically Signed   By: Sebastian Ache M.D.   On: 05/24/2022 13:59    Antibiotics:  Anti-infectives (From admission, onward)    Start     Dose/Rate Route Frequency Ordered Stop   06/23/22 1000  cephALEXin (KEFLEX) capsule 250 mg        250 mg Oral Every other day 06/22/22 1718     06/22/22 2200  vancomycin (VANCOCIN) IVPB 1000 mg/200 mL premix        1,000 mg 200 mL/hr over 60 Minutes Intravenous  Once 06/22/22 1725 06/22/22 2252   06/22/22 0930  vancomycin (VANCOCIN) IVPB 1000 mg/200 mL premix        1,000 mg 200 mL/hr over 60 Minutes Intravenous On call to O.R. 06/22/22 0927 06/22/22 1115       Discharge Exam: Blood pressure (!) 106/53, pulse 77, temperature 97.9 F (36.6 C), temperature source Oral, resp. rate 18, height 5' (1.524 m), weight 51.7 kg, SpO2 100 %. Neurologic: Grossly normal Ambulating and voiding well incision cdi   Discharge Medications:   Allergies as of 06/23/2022       Reactions   Amoxicillin Other (See Comments)   Upset stomach   Aspirin    bruising   Azithromycin    stomach upset   Doxycycline Hyclate    GI upset   Buspirone Hcl Palpitations   Mirtazapine Palpitations   Weight gain        Medication List     STOP taking these medications    methylPREDNISolone 4 MG tablet Commonly known as: MEDROL       TAKE these medications    acetaminophen 500 MG tablet Commonly known as: TYLENOL Take 1,000 mg by mouth every 6 (six) hours as needed for moderate pain.   acetaminophen 650 MG CR tablet Commonly known as: TYLENOL Take 650 mg by mouth every 8 (eight) hours as needed for pain.   Aimovig 70 MG/ML Soaj Generic drug: Erenumab-aooe Inject 70 mg into the skin every 30 (thirty) days.   alfuzosin 10 MG 24 hr tablet Commonly known as: UROXATRAL Take 10 mg by mouth  daily with breakfast.   cephALEXin 250 MG capsule Commonly known as: KEFLEX Take 250 mg by mouth every other day.   cholecalciferol 25 MCG (1000 UNIT) tablet Commonly known as: VITAMIN D3 Take 1,000 Units by mouth daily.   clonazePAM 0.5 MG tablet Commonly known as: KLONOPIN Take 1 tablet (0.5 mg total) by mouth at bedtime as needed for anxiety. What changed:  how much to take when to take this   CRANBERRY PO Take 1 tablet by mouth 2 (two) times daily.   dicyclomine 10 MG capsule Commonly known as: BENTYL Take 10 mg by mouth 3 (three) times daily.   DULoxetine 60 MG capsule Commonly known as: CYMBALTA Take 1 capsule (60 mg total) by mouth daily.   ferrous sulfate 325 (  65 FE) MG tablet Take 325 mg by mouth daily.   fludrocortisone 0.1 MG tablet Commonly known as: FLORINEF Take 1 tablet daily   fluticasone 0.05 % cream Commonly known as: CUTIVATE Apply 1 application  topically 2 (two) times daily as needed for irritation.   fluticasone 50 MCG/ACT nasal spray Commonly known as: FLONASE Place 1 spray into both nostrils daily as needed for allergies or rhinitis.   gabapentin 300 MG capsule Commonly known as: NEURONTIN Take 2-3 capsules (600-900 mg total) by mouth at bedtime. What changed: how much to take   Horizant 600 MG Tbcr Generic drug: Gabapentin Enacarbil TAKE ONE TABLET BY MOUTH AT BEDTIME   HYDROcodone-acetaminophen 7.5-325 MG tablet Commonly known as: NORCO Take 1 tablet by mouth every 4 (four) hours as needed for moderate pain ((score 4 to 6)).   multivitamin tablet Take 1 tablet by mouth in the morning.   Nurtec 75 MG Tbdp Generic drug: Rimegepant Sulfate Take 1 tab at onset of migraine.  May repeat in 2 hrs, if needed.  Max dose: 2 tabs/day. This is a 30 day prescription.   OVER THE COUNTER MEDICATION Take 1 capsule by mouth at bedtime. Sleep 3   polyethylene glycol 17 g packet Commonly known as: MiraLax Take 17 g by mouth 2 (two) times  daily. 17 grams in 6 oz of favorite drink twice a day until bowel movement.  LAXITIVE.  Restart if two days since last bowel movement What changed:  when to take this reasons to take this additional instructions   PROBIOTIC PO Take 1 capsule by mouth daily.   promethazine 25 MG tablet Commonly known as: PHENERGAN Take 25 mg by mouth every 6 (six) hours as needed for nausea or vomiting.   propranolol 10 MG tablet Commonly known as: INDERAL Take 10 mg by mouth 3 (three) times daily as needed (anxiety/palpitations). Currently taking it twice a day   SYSTANE OP Place 2 drops into both eyes daily as needed (for dry eyes).   tiZANidine 2 MG tablet Commonly known as: ZANAFLEX Take 2 tablets (4 mg total) by mouth every 8 (eight) hours as needed for muscle spasms. What changed:  when to take this reasons to take this   VITAMIN B COMPLEX PO Take 1 capsule by mouth in the morning.   VITAMIN C PO Take 1 tablet by mouth daily.               Durable Medical Equipment  (From admission, onward)           Start     Ordered   06/22/22 1719  DME Walker rolling  Once       Question:  Patient needs a walker to treat with the following condition  Answer:  S/P lumbar fusion   06/22/22 1718   06/22/22 1719  DME 3 n 1  Once        06/22/22 1718            Disposition: home   Final Dx: PLIF L3-4  Discharge Instructions      Remove dressing in 72 hours   Complete by: As directed    Call MD for:  difficulty breathing, headache or visual disturbances   Complete by: As directed    Call MD for:  persistant nausea and vomiting   Complete by: As directed    Call MD for:  redness, tenderness, or signs of infection (pain, swelling, redness, odor or green/yellow discharge around incision site)  Complete by: As directed    Call MD for:  severe uncontrolled pain   Complete by: As directed    Call MD for:  temperature >100.4   Complete by: As directed    Diet - low sodium  heart healthy   Complete by: As directed    Increase activity slowly   Complete by: As directed           Signed: Tiana Loft Anela Bensman 06/23/2022, 7:38 AM

## 2022-06-30 DIAGNOSIS — Z79891 Long term (current) use of opiate analgesic: Secondary | ICD-10-CM | POA: Diagnosis not present

## 2022-06-30 DIAGNOSIS — Z4789 Encounter for other orthopedic aftercare: Secondary | ICD-10-CM | POA: Diagnosis not present

## 2022-06-30 DIAGNOSIS — Z981 Arthrodesis status: Secondary | ICD-10-CM | POA: Diagnosis not present

## 2022-06-30 DIAGNOSIS — Z792 Long term (current) use of antibiotics: Secondary | ICD-10-CM | POA: Diagnosis not present

## 2022-07-13 ENCOUNTER — Telehealth: Payer: Self-pay | Admitting: Neurology

## 2022-07-13 NOTE — Telephone Encounter (Signed)
Returned call to pt and she stated that she had back surgery recently on 06/22/22 and says she is taking gabapentin 900 at bed time. She had stated that in the past her and Doctor Terrace Arabia had discussed increasing. Will route to the work in provider this evening to advisement

## 2022-07-13 NOTE — Telephone Encounter (Signed)
Actually based on her last creatinine value her creatinine clearance is 38(kidney function). Gabapentin is metabolized through the kidneys. Based on dosing she should not be taking more than 700mg  twice a day(max 700mg  at one time twice daily) so I would be hesitant to increase her nightime dose to 1200mg . I would actually tell patient to call her back surgeon.

## 2022-07-13 NOTE — Telephone Encounter (Signed)
Pt would like a call to discuss if it is possible pt can get an increase on her Gabapentin , please call.

## 2022-07-14 ENCOUNTER — Other Ambulatory Visit: Payer: Self-pay | Admitting: Neurology

## 2022-07-14 MED ORDER — GABAPENTIN 300 MG PO CAPS: ORAL_CAPSULE | ORAL | 0 refills | Status: DC

## 2022-07-14 NOTE — Addendum Note (Signed)
Addended by: Deatra James on: 07/14/2022 01:02 PM   Modules accepted: Orders

## 2022-07-14 NOTE — Telephone Encounter (Signed)
Spoke to Dr. Lucia Gaskins verbally for recommendations

## 2022-07-14 NOTE — Telephone Encounter (Signed)
Come see me, I don;t understand what she is asking for I'm in my office or emg room thanks

## 2022-07-21 ENCOUNTER — Other Ambulatory Visit (INDEPENDENT_AMBULATORY_CARE_PROVIDER_SITE_OTHER): Payer: PPO

## 2022-07-21 DIAGNOSIS — E2749 Other adrenocortical insufficiency: Secondary | ICD-10-CM | POA: Diagnosis not present

## 2022-07-21 DIAGNOSIS — M5416 Radiculopathy, lumbar region: Secondary | ICD-10-CM | POA: Diagnosis not present

## 2022-07-21 LAB — BASIC METABOLIC PANEL
BUN: 20 mg/dL (ref 6–23)
CO2: 31 mEq/L (ref 19–32)
Calcium: 10.5 mg/dL (ref 8.4–10.5)
Chloride: 99 mEq/L (ref 96–112)
Creatinine, Ser: 0.98 mg/dL (ref 0.40–1.20)
GFR: 56.74 mL/min — ABNORMAL LOW (ref >60.00)
Glucose, Bld: 100 mg/dL — ABNORMAL HIGH (ref 70–99)
Potassium: 4.9 mEq/L (ref 3.5–5.1)
Sodium: 138 mEq/L (ref 135–145)

## 2022-07-22 ENCOUNTER — Other Ambulatory Visit: Payer: PPO

## 2022-07-25 ENCOUNTER — Telehealth: Payer: Self-pay | Admitting: Neurology

## 2022-07-25 ENCOUNTER — Ambulatory Visit: Payer: PPO | Admitting: Endocrinology

## 2022-07-25 VITALS — BP 115/60 | HR 66

## 2022-07-25 DIAGNOSIS — E213 Hyperparathyroidism, unspecified: Secondary | ICD-10-CM

## 2022-07-25 DIAGNOSIS — E2749 Other adrenocortical insufficiency: Secondary | ICD-10-CM | POA: Diagnosis not present

## 2022-07-25 DIAGNOSIS — I951 Orthostatic hypotension: Secondary | ICD-10-CM

## 2022-07-25 NOTE — Telephone Encounter (Signed)
Returned call to patient who stated that her right leg is going numb from the knee down ongoing for 2 weeks. Pain is rated at 5/10. Pt fell 1 week ago. Pt stated that its the same knee she had replaced 3 years ago and she wanted to make sure that it wasn't related to neurology considering it went numb on her when she fell.

## 2022-07-25 NOTE — Progress Notes (Unsigned)
Patient ID: Katrina Rivera, female   DOB: 09-28-1945, 77 y.o.   MRN: [id number]   Subjective:      Chief complaint: Endocrinology follow-up  PROBLEM 1: Dysautonomic orthostatic hypotension   PAST history: She has had long-standing problems with orthostatic hypotension and also hyponatremia. Has been diagnosed with dysautonomia and has multiple other problems related to autonomic neuropathy.   She has been on Florinef since 2004 previously taking 3 tablets daily along with 5 tablets of potassium which had previously controlled her symptoms well . Because of persistent orthostatic symptoms she had been tried on midodrine on 05/28/12 but this was later stopped when blood pressure increased.  She  had medication adjustments done frequently over the last year for regulating her blood pressure.  RECENT history:   She has  been on variable doses of Florinef long-term, as much as 3 a day previously,   Recently has been on fludrocortisone 0.5 tablet 7 days a week  After her last visit when she was taking 1 tablet 3 days a week she was having fluctuating blood pressures and periodically high readings and is doing better with 1/2 tablet daily She has not checked her blood pressure standing up since she just had surgery in her back a month ago She feels dizzy but not lightheaded as usual Sitting blood pressures have been fairly good at home, usually averaging about 110-120 and only rarely as low as 108  Potassium has been consistently normal without any supplements    Lab Results  Component Value Date   CREATININE 0.98 07/21/2022   BUN 20 07/21/2022   NA 138 07/21/2022   K 4.9 07/21/2022   CL 99 07/21/2022   CO2 31 07/21/2022     Adrenal Insufficiency:   This is secondary to pituitary dysfunction  Prior testing included Cortrosyn test showing stimulated level of 15.7, baseline 3.9.  Also confirmed by 24 hour urine free cortisol which was only 3.0.  Although previously  she had tolerated oral hydrocortisone and small doses with improvement in her symptoms she did not continue this long-term She was again symptomatic with weight loss, decreased appetite, nausea and also diarrhea Cortrosyn stimulation test on 03/12/14 showed baseline cortisol level of 0.3 and post injection of 1.1 only  She had GI side effects from hydrocortisone and prednisone and was in the past using hydrocortisone injections using insulin syringe   RECENT HISTORY:  She has been taking her methylprednisolone as directed twice a day without any unusual change in appetite, weakness or weight loss She does tend to have fatigue chronically and also small appetite She is taking 4 mg in the early morning and half tablet in the late afternoon very regularly Previously unable to taper the dose down   She knows to take stress doses for acute illnesses  Wt Readings from Last 3 Encounters:  06/22/22 114 lb (51.7 kg)  06/09/22 114 lb 6.4 oz (51.9 kg)  05/17/22 112 lb 8 oz (51 kg)     General Endocrinology:   HYPERCALCEMIA:  She has had intermittent hypercalcemia since at least 2018, previously only temporarily associated with renal dysfunction Her PTH level was 31 previously and subsequently 23 and 38 1, 25 vitamin D level is normal  However her calcium is variable and as high as 11.2, recently normal  She is not on calcium supplements   Lab Results  Component Value Date   PTH 38 06/01/2020   PTH Comment 06/01/2020   CALCIUM 10.5 07/21/2022  PHOS 1.9 (L) 07/23/2006     Allergies as of 07/25/2022       Reactions   Amoxicillin Other (See Comments)   Upset stomach   Aspirin    bruising   Azithromycin    stomach upset   Doxycycline Hyclate    GI upset   Hydroxyzine Hcl Other (See Comments)   Buspirone Hcl Palpitations   Mirtazapine Palpitations   Weight gain        Medication List        Accurate as of July 25, 2022  1:52 PM. If you have any questions, ask your  nurse or doctor.          acetaminophen 500 MG tablet Commonly known as: TYLENOL Take 1,000 mg by mouth every 6 (six) hours as needed for moderate pain.   acetaminophen 650 MG CR tablet Commonly known as: TYLENOL Take 650 mg by mouth every 8 (eight) hours as needed for pain.   Aimovig 70 MG/ML Soaj Generic drug: Erenumab-aooe Inject 70 mg into the skin every 30 (thirty) days.   Ajovy 225 MG/1.5ML Sosy Generic drug: Fremanezumab-vfrm 1.5 mL Subcutaneous   alfuzosin 10 MG 24 hr tablet Commonly known as: UROXATRAL Take 10 mg by mouth daily with breakfast.   cephALEXin 250 MG capsule Commonly known as: KEFLEX Take 250 mg by mouth every other day.   cholecalciferol 25 MCG (1000 UNIT) tablet Commonly known as: VITAMIN D3 Take 1,000 Units by mouth daily.   clonazePAM 0.5 MG tablet Commonly known as: KLONOPIN Take 1 tablet (0.5 mg total) by mouth at bedtime as needed for anxiety. What changed:  how much to take when to take this   CRANBERRY PO Take 1 tablet by mouth 2 (two) times daily.   dicyclomine 10 MG capsule Commonly known as: BENTYL Take 10 mg by mouth 3 (three) times daily.   DULoxetine 60 MG capsule Commonly known as: CYMBALTA Take 1 capsule (60 mg total) by mouth daily.   ferrous sulfate 325 (65 FE) MG tablet Take 325 mg by mouth daily.   fludrocortisone 0.1 MG tablet Commonly known as: FLORINEF Take 1 tablet daily   fluticasone 0.05 % cream Commonly known as: CUTIVATE Apply 1 application  topically 2 (two) times daily as needed for irritation.   fluticasone 50 MCG/ACT nasal spray Commonly known as: FLONASE Place 1 spray into both nostrils daily as needed for allergies or rhinitis.   gabapentin 300 MG capsule Commonly known as: NEURONTIN Take 600mg  (2 capsules) up to 2x daily   Horizant 600 MG Tbcr Generic drug: Gabapentin Enacarbil TAKE ONE TABLET BY MOUTH AT BEDTIME   HYDROcodone-acetaminophen 7.5-325 MG tablet Commonly known as:  NORCO Take 1 tablet by mouth every 4 (four) hours as needed for moderate pain ((score 4 to 6)).   Linzess 72 MCG capsule Generic drug: linaclotide Take 72 mcg by mouth every morning.   methylPREDNISolone 4 MG tablet Commonly known as: MEDROL as directed Orally 1 tablet AM and 1/2 tablet afternoon   multivitamin tablet Take 1 tablet by mouth in the morning.   Nurtec 75 MG Tbdp Generic drug: Rimegepant Sulfate Take 1 tab at onset of migraine.  May repeat in 2 hrs, if needed.  Max dose: 2 tabs/day. This is a 30 day prescription.   OVER THE COUNTER MEDICATION Take 1 capsule by mouth at bedtime. Sleep 3   polyethylene glycol 17 g packet Commonly known as: MiraLax Take 17 g by mouth 2 (two) times daily. 17 grams in  6 oz of favorite drink twice a day until bowel movement.  LAXITIVE.  Restart if two days since last bowel movement What changed:  when to take this reasons to take this additional instructions   PROBIOTIC PO Take 1 capsule by mouth daily.   promethazine 25 MG tablet Commonly known as: PHENERGAN Take 25 mg by mouth every 6 (six) hours as needed for nausea or vomiting.   propranolol 10 MG tablet Commonly known as: INDERAL Take 10 mg by mouth 3 (three) times daily as needed (anxiety/palpitations). Currently taking it twice a day   SYSTANE OP Place 2 drops into both eyes daily as needed (for dry eyes).   tiZANidine 2 MG tablet Commonly known as: ZANAFLEX Take 2 tablets (4 mg total) by mouth every 8 (eight) hours as needed for muscle spasms.   VITAMIN B COMPLEX PO Take 1 capsule by mouth in the morning.   VITAMIN C PO Take 1 tablet by mouth daily.        Review of Systems    Genitourinary: She has had difficulty emptying her bladder and is being treated by urologist   OSTEOPOROSIS: This is  being treated by her orthopedic surgeon with Prolia  Previous bone density in 2016 showed the following: AP LUMBAR SPINE L1 through L4 Young Adult T-Score:   -2.4 LEFT FEMUR NECK: Young Adult T-Score: -3.1  Last bone density in 3/23 showed stable to improved levels with lowest bone density -2.8  Previously: Bone density of 12/20 showed improvement in her T-scores up to -2.7 at the right femur and - 3.6 at the radius  Vitamin D deficiency: She is on 1000 units vitamin D3   No recent history of thyroid disease:  Lab Results  Component Value Date   TSH 1.19 12/22/2021   TSH 2.21 08/21/2018   TSH 1.30 02/10/2017   FREET4 0.70 12/22/2021   FREET4 0.77 08/21/2018   FREET4 0.83 02/10/2017      Objective:   Physical Exam  BP 115/60 (BP Location: Left Arm, Patient Position: Standing, Cuff Size: Normal)   Pulse 66   SpO2 98%     Assessment:        Orthostatic dysautonomic hypotension syndrome:   Her orthostatic hypotension has been recently well-controlled with relatively good readings today and no significant drop on standing up Also not symptomatic with orthostatic lightheadedness She has been taking 0.05 mg Florinef daily She will continue the same regimen but also periodically check standing blood pressure readings   Adrenal insufficiency:  She has long-standing secondary adrenal insufficiency with fairly typical symptoms of cortisol deficiency at baseline and very low cortisol levels on the stimulation test Her symptoms are consistently well controlled on methylprednisolone 4 mg in the morning and 2 mg in the evening She will continue the regimen unchanged  Hypercalcemia: Calcium is only generally borderline or slightly high and she has not had any worsening of her osteoporosis  Renal function stable   Plan:       As above Follow-up in 3 months  Lorianne Malbrough Lucianne Muss  Note: This office note was prepared with Insurance underwriter. Any transcriptional errors that result from this process are unintentional.

## 2022-07-25 NOTE — Patient Instructions (Signed)
Same doses.  Check standing BP

## 2022-07-25 NOTE — Telephone Encounter (Signed)
Pt stated she needs a sooner appointment. Stated she right leg is going numb and she can hardly move is. Requesting is requesting a call from nurse to discuss.

## 2022-07-26 ENCOUNTER — Encounter: Payer: Self-pay | Admitting: Endocrinology

## 2022-07-26 DIAGNOSIS — R110 Nausea: Secondary | ICD-10-CM | POA: Diagnosis not present

## 2022-07-26 DIAGNOSIS — Z0000 Encounter for general adult medical examination without abnormal findings: Secondary | ICD-10-CM | POA: Diagnosis not present

## 2022-07-26 DIAGNOSIS — R42 Dizziness and giddiness: Secondary | ICD-10-CM | POA: Diagnosis not present

## 2022-07-26 MED ORDER — METHYLPREDNISOLONE 4 MG PO TABS: ORAL_TABLET | ORAL | 1 refills | Status: DC

## 2022-07-27 ENCOUNTER — Encounter: Payer: Self-pay | Admitting: Neurology

## 2022-07-27 DIAGNOSIS — S8002XA Contusion of left knee, initial encounter: Secondary | ICD-10-CM | POA: Diagnosis not present

## 2022-07-27 DIAGNOSIS — M5432 Sciatica, left side: Secondary | ICD-10-CM | POA: Diagnosis not present

## 2022-07-27 NOTE — Telephone Encounter (Signed)
Called pt, LVM to please call office to schedule appointment.  

## 2022-07-28 ENCOUNTER — Other Ambulatory Visit (HOSPITAL_COMMUNITY): Payer: Self-pay | Admitting: Neurological Surgery

## 2022-07-28 DIAGNOSIS — M5416 Radiculopathy, lumbar region: Secondary | ICD-10-CM

## 2022-07-30 ENCOUNTER — Ambulatory Visit (HOSPITAL_COMMUNITY)
Admission: RE | Admit: 2022-07-30 | Discharge: 2022-07-30 | Disposition: A | Discharge status: Home / Self Care | Payer: PPO | Source: Ambulatory Visit | Attending: Neurological Surgery | Admitting: Neurological Surgery

## 2022-07-30 DIAGNOSIS — M5116 Intervertebral disc disorders with radiculopathy, lumbar region: Secondary | ICD-10-CM | POA: Diagnosis not present

## 2022-07-30 DIAGNOSIS — M5416 Radiculopathy, lumbar region: Secondary | ICD-10-CM | POA: Insufficient documentation

## 2022-07-30 DIAGNOSIS — M5115 Intervertebral disc disorders with radiculopathy, thoracolumbar region: Secondary | ICD-10-CM | POA: Diagnosis not present

## 2022-07-30 DIAGNOSIS — M4856XA Collapsed vertebra, not elsewhere classified, lumbar region, initial encounter for fracture: Secondary | ICD-10-CM | POA: Diagnosis not present

## 2022-07-30 MED ORDER — GADOBUTROL 1 MMOL/ML IV SOLN
5.0000 mL | Freq: Once | INTRAVENOUS | Status: AC | PRN
  Administered 2022-07-30: 5 mL via INTRAVENOUS

## 2022-08-04 ENCOUNTER — Ambulatory Visit: Payer: PPO | Admitting: Neurology

## 2022-08-04 ENCOUNTER — Encounter: Payer: Self-pay | Admitting: Neurology

## 2022-08-04 VITALS — BP 122/72 | HR 74 | Ht 60.0 in | Wt 114.0 lb

## 2022-08-04 DIAGNOSIS — G43709 Chronic migraine without aura, not intractable, without status migrainosus: Secondary | ICD-10-CM

## 2022-08-04 DIAGNOSIS — G35D Multiple sclerosis, unspecified: Secondary | ICD-10-CM

## 2022-08-04 DIAGNOSIS — Z981 Arthrodesis status: Secondary | ICD-10-CM | POA: Diagnosis not present

## 2022-08-04 DIAGNOSIS — G609 Hereditary and idiopathic neuropathy, unspecified: Secondary | ICD-10-CM

## 2022-08-04 DIAGNOSIS — G35 Multiple sclerosis: Secondary | ICD-10-CM

## 2022-08-04 DIAGNOSIS — R269 Unspecified abnormalities of gait and mobility: Secondary | ICD-10-CM | POA: Diagnosis not present

## 2022-08-04 DIAGNOSIS — G2581 Restless legs syndrome: Secondary | ICD-10-CM | POA: Diagnosis not present

## 2022-08-04 MED ORDER — GABAPENTIN 300 MG PO CAPS: ORAL_CAPSULE | ORAL | 5 refills | Status: DC

## 2022-08-04 MED ORDER — CLONAZEPAM 0.5 MG PO TABS: 0.5000 mg | ORAL_TABLET | Freq: Every evening | ORAL | 1 refills | Status: AC | PRN

## 2022-08-04 NOTE — Progress Notes (Signed)
ASSESSMENT AND PLAN 77 y.o. year old female   1.  Possible relapsing remitting multiple sclerosis -MRI of the brain in October 2023 showed multiple T2/FLAIR hyperintensity signal, consistent with small vessel disease versus possible multiple sclerosis compared to previous scan in 2015, only 2 subcentimeter new lesion, no contrast-enhancement, less likely cause any symptomatic worsening  2.  Gait abnormality 3.  Chronic low back pain 4.  Post lumbar fusion May 2024 -No significant muscle weakness noted, other than what seems expected postoperatively with left hip flexion -Seeing Dr. Yetta Barre next week, had MRI lumbar spine is pending  5.  Restless leg syndrome -Continue gabapentin 300 mg, 2 to 3 tablets at bedtime -Also on Horizant 600 mg daily  6.  Anxiety -Continue Cymbalta 60 mg daily -Continue Klonopin 0.5 mg as needed for anxiety (also helps with RLS) -Continue work with primary care for better management of anxiety  7. Headaches with migraine features  -Continue Aimovig 70 mg monthly injection for migraine prevention -Continue Nurtec 75 mg as needed for acute headache -Normal ESR C-reactive protein  Meds ordered this encounter  Medications   gabapentin (NEURONTIN) 300 MG capsule    Sig: Take 2-3 tablets at bedtime    Dispense:  90 capsule    Refill:  5   clonazePAM (KLONOPIN) 0.5 MG tablet    Sig: Take 1 tablet (0.5 mg total) by mouth at bedtime as needed for anxiety.    Dispense:  30 tablet    Refill:  1   DIAGNOSTIC DATA (LABS, IMAGING, TESTING) - I reviewed patient records, labs, notes, testing and imaging myself where available.   HISTORY  Katrina Rivera is a very pleasant 77 year-old right-handed woman, with relapsing remitting multiple sclerosis, was treated with Betaseron 4 -5 years, but could not tolerate the side effect, now is not on any immunomodulation therapy. neuropathic pain of bilateral lower extremities.   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    She has baseline gait difficulty, she fell in May 2013, fractured both elbows and her left knee cap.  She intermittently has been using a cane or walker.  She could not tolerate Requip because of nausea. She has been on gabapentin 300 mg 3 bid. She gained significant weight gain when on Lyrica, and therefore was switched back to gabapentin. She has orthostatic hypotension, and is on midodrine 2.5 mg and fludrocortisone 0.1 mg 1 in AM and 2 at night, and she is also on 20 mg of hydrocortisone.   She complains of bilateral lower extremity burning stinging sensation, getting worse when she sits still, difficulty falling into sleep, she has the urge to move because of bilateral extremity discomfort, has tried Requip, could not tolerate it because of GI side effects, Neuprol patch does not work either, she also tried Elavil, Cymbalta in the past, could not tolerate it due to side effect.   UPDATE Nov 17 2020: She is accompanied by her husband at today's visit, just had right knee replacement in July 2022, finishing up physical therapy, continue has gait abnormality, wearing ankle brace now,  seems to help her some  She continue to take Horizant 600 mg at bedtime, gabapentin 100 mg 1 to 2 tablets every night as needed  She complains of intermittent diffuse body achy sensation,  UPDATE May 17 2022: She is companied by her husband, constellation of complaints, worsening anxiety, difficulty with memory, difficulty sleeping,  worsening low back pain, is under the care of neurosurgeon Dr. Marikay Alar, planning on CT myelogram, previous lumbar decompression surgery twice,  She is already on polypharmacy treatment, including gabapentin 300 mg 2 to 3 tablets at bedtime, horizant 600 mg every night, Cymbalta 60 mg daily,  Her headache is under better control, tolerating Ajovy monthly  Personally reviewed MRI of the brain in October 2023, no acute abnormality, multiple T2/FLAIR signal abnormality, small vessel disease, versus possible multiple sclerosis, compared to previous study in 2015, there was 2 new foci of white matter and right subcortical region, no contrast-enhancement  Update August 04, 2022 SS: Saw Dr. Terrace Arabia in April 2024, started on clonazepam 0.5 mg as needed, did 3 months of Ajovy didn't see much benefit, switched to Aimovig and Nurtec PRN. Admitted 06/22/22 with Dr. Yetta Barre she had PLIF L3-4.  With no surgical complications. Since surgery her left leg has been numb, tingling, weak. Had MRI Lumbar spine 6/22, seeing Dr. Yetta Barre next week. Fall 2 weeks post-op, went backwards. Using walker now. Needs refill on Klonopin 0.5 mg 1/2 tablet daily PRN anxiety, needs Horizant, Cymbalta, tizanidine. On Aimovig 70 mg, has only done 1 injection. Has only needed Nurtec once, it helped. Gabapentin 900 mg at bedtime.  Having a lot of low back pain, cannot straighten up. She worries her MS is coming back. Had trouble with PT, paused it.   REVIEW OF SYSTEMS: Out of a complete 14 system review of symptoms, the patient complains only of the following symptoms, and all other reviewed systems are negative.  See HPI  PHYSICAL EXAM  Vitals:   05/17/22 1320  BP: (!) 169/91  Pulse: (!) 104  Weight: 112 lb 8 oz (51 kg)  Height: 5' (1.524 m)   Body mass index is 21.97 kg/m.  PHYSICAL EXAMNIATION:  General: The patient is alert and cooperative at the time of the examination.  Using a walker postop. Bruising to left shin.  Skin: No  significant peripheral edema is noted.  Neurologic Exam  Mental status: The patient is alert and oriented x 3 at the time of the examination. The patient has apparent normal recent and remote memory, with an apparently normal attention span and concentration ability.  Is in good spirits, is cooperative, good historian.  Cranial nerves: Facial symmetry is present. Speech is normal, no aphasia or dysarthria is noted. Extraocular movements are full. Visual fields are full.  Motor: The patient has good strength, exception left hip flexion 4/5, postsurgical  Sensory examination: Soft touch sensation is symmetric on the face, arms, and legs.  Coordination: The patient has good finger-nose-finger and heel-to-shin bilaterally.  Gait and station: Slow to rise from seated position, has to push off, gait is cautious, wide-based, uses walker in the hallway  Reflexes: Deep tendon reflexes are symmetric and normal.  ALLERGIES: Allergies  Allergen Reactions   Amoxicillin Other (See Comments)    Upset stomach   Aspirin     bruising   Azithromycin     stomach upset   Doxycycline Hyclate     GI upset   Hydroxyzine Hcl Other (See Comments)   Buspirone Hcl Palpitations   Mirtazapine  Palpitations    Weight gain    HOME MEDICATIONS: Outpatient Medications Prior to Visit  Medication Sig Dispense Refill   acetaminophen (TYLENOL) 500 MG tablet Take 1,000 mg by mouth every 6 (six) hours as needed for moderate pain.     acetaminophen (TYLENOL) 650 MG CR tablet Take 650 mg by mouth every 8 (eight) hours as needed for pain.     alfuzosin (UROXATRAL) 10 MG 24 hr tablet Take 10 mg by mouth daily with breakfast.     Ascorbic Acid (VITAMIN C PO) Take 1 tablet by mouth daily.     B Complex Vitamins (VITAMIN B COMPLEX PO) Take 1 capsule by mouth in the morning.     cephALEXin (KEFLEX) 250 MG capsule Take 250 mg by mouth every other day.     cholecalciferol (VITAMIN D3) 25 MCG (1000 UNIT) tablet Take 1,000  Units by mouth daily.     clonazePAM (KLONOPIN) 0.5 MG tablet Take 1 tablet (0.5 mg total) by mouth at bedtime as needed for anxiety. (Patient taking differently: Take 0.25 mg by mouth at bedtime.) 30 tablet 1   CRANBERRY PO Take 1 tablet by mouth 2 (two) times daily.     dicyclomine (BENTYL) 10 MG capsule Take 10 mg by mouth 3 (three) times daily.     DULoxetine (CYMBALTA) 60 MG capsule Take 1 capsule (60 mg total) by mouth daily. 90 capsule 3   Erenumab-aooe (AIMOVIG) 70 MG/ML SOAJ Inject 70 mg into the skin every 30 (thirty) days. 1.12 mL 11   ferrous sulfate 325 (65 FE) MG tablet Take 325 mg by mouth daily.     fludrocortisone (FLORINEF) 0.1 MG tablet Take 1 tablet daily 90 tablet 0   fluticasone (CUTIVATE) 0.05 % cream Apply 1 application  topically 2 (two) times daily as needed for irritation.     fluticasone (FLONASE) 50 MCG/ACT nasal spray Place 1 spray into both nostrils daily as needed for allergies or rhinitis.     Fremanezumab-vfrm (AJOVY) 225 MG/1.5ML SOSY 1.5 mL Subcutaneous     gabapentin (NEURONTIN) 300 MG capsule Take 600mg  (2 capsules) up to 2x daily 270 capsule 0   HORIZANT 600 MG TBCR TAKE ONE TABLET BY MOUTH AT BEDTIME 90 tablet 4   HYDROcodone-acetaminophen (NORCO) 7.5-325 MG tablet Take 1 tablet by mouth every 4 (four) hours as needed for moderate pain ((score 4 to 6)). 40 tablet 0   LINZESS 72 MCG capsule Take 72 mcg by mouth every morning.     methylPREDNISolone (MEDROL) 4 MG tablet as directed Orally 1 tablet AM and 1/2 tablet afternoon 135 tablet 1   Multiple Vitamin (MULTIVITAMIN) tablet Take 1 tablet by mouth in the morning.     OVER THE COUNTER MEDICATION Take 1 capsule by mouth at bedtime. Sleep 3     Polyethyl Glycol-Propyl Glycol (SYSTANE OP) Place 2 drops into both eyes daily as needed (for dry eyes).     polyethylene glycol (MIRALAX) 17 g packet Take 17 g by mouth 2 (two) times daily. 17 grams in 6 oz of favorite drink twice a day until bowel movement.  LAXITIVE.   Restart if two days since last bowel movement (Patient taking differently: Take 17 g by mouth daily as needed for moderate constipation.) 14 packet 0   Probiotic Product (PROBIOTIC PO) Take 1 capsule by mouth daily.     promethazine (PHENERGAN) 25 MG tablet Take 25 mg by mouth every 6 (six) hours as needed for nausea or vomiting.  propranolol (INDERAL) 10 MG tablet Take 10 mg by mouth 3 (three) times daily as needed (anxiety/palpitations). Currently taking it twice a day     Rimegepant Sulfate (NURTEC) 75 MG TBDP Take 1 tab at onset of migraine.  May repeat in 2 hrs, if needed.  Max dose: 2 tabs/day. This is a 30 day prescription. 10 tablet 5   tiZANidine (ZANAFLEX) 2 MG tablet Take 2 tablets (4 mg total) by mouth every 8 (eight) hours as needed for muscle spasms. 60 tablet 1   No facility-administered medications prior to visit.    PAST MEDICAL HISTORY: Past Medical History:  Diagnosis Date   Addison's disease (HCC)    takes Solu Cortef daily   Anemia    takes Ferrous Sulfate daily   Anxiety    takes Xanax nightly   Arthritis    Chronic back pain    stenosis   Depression    takes Cymbalta daily   Dizziness    if b/p drops    Fibromyalgia    Fibromyalgia    History of blood transfusion    no abnormal reaction noted   History of bronchitis    many yrs ago    Hypokalemia    takes Potassium daily   Hypotension    Hypotension    takes Florinef daily   IBS (irritable bowel syndrome)    takes Align daily   Insomnia    takes Trazodone nightly   Joint pain    Multiple sclerosis (HCC)    doesn't take any meds   Multiple sclerosis (HCC)    New onset headache 12/22/2021   Nocturia    Osteoporosis    Palpitations    Peripheral neuropathy    Primary localized osteoarthritis of left knee 08/11/2020   Restless leg syndrome    Seasonal allergies    takes Zyrtec daily;uses Flonase daily as needed   Syncope    when missed methylprednisolone dose   Urinary frequency    takes  Flomax daily   Weakness    numbness and tingling    PAST SURGICAL HISTORY: Past Surgical History:  Procedure Laterality Date   ABDOMINAL HYSTERECTOMY  02/07/1986   APPENDECTOMY  02/07/1986   CESAREAN SECTION  1973/1977   x2   CHOLECYSTECTOMY  02/08/1995   COLECTOMY  02/08/1988   COLONOSCOPY     ESOPHAGOGASTRODUODENOSCOPY     EYE SURGERY     bilateral - /w IOL- cataracts   FRACTURE SURGERY Right    rods and screws-right leg   LUMBAR LAMINECTOMY/DECOMPRESSION MICRODISCECTOMY Left 01/24/2013   Procedure: LUMBAR FIVE TO SACRAL ONE LUMBAR LAMINECTOMY/DECOMPRESSION MICRODISCECTOMY 1 LEVEL;  Surgeon: Tia Alert, MD;  Location: [facility];  Service: Neurosurgery;  Laterality: Left;   MAXIMUM ACCESS (MAS)POSTERIOR LUMBAR INTERBODY FUSION (PLIF) 1 LEVEL N/A 06/18/2014   Procedure: MAXIMUM ACCESS SURGERY POSTERIOR LUMBAR INTERBODY FUSION LUMBAR FIVE TO SACRAL ONE ;  Surgeon: Tia Alert, MD;  Location: [facility];  Service: Neurosurgery;  Laterality: N/A;   SPINAL CORD STIMULATOR BATTERY EXCHANGE Right 07/30/2021   Procedure: Spinal cord stimulator battery replacement;  Surgeon: Tia Alert, MD;  Location: [facility] OR;  Service: Neurosurgery;  Laterality: Right;   SPINAL CORD STIMULATOR IMPLANT     TOTAL KNEE ARTHROPLASTY Left 08/24/2020   Procedure: TOTAL KNEE ARTHROPLASTY;  Surgeon: Salvatore Marvel, MD;  Location: [facility];  Service: Orthopedics;  Laterality: Left;    FAMILY HISTORY: Family History  Problem Relation Age of Onset   Hypertension Mother  Stroke Mother    Heart attack Father    Tremor Brother     SOCIAL HISTORY: Social History   Socioeconomic History   Marital status: Married    Spouse name: [name]   Number of children: 2   Years of education: 12   Highest education level: Not on file  Occupational History    Employer: DISABLED    Comment: Disabled  Tobacco Use   Smoking status: Never   Smokeless tobacco: Never  Vaping Use   Vaping Use: Never used   Substance and Sexual Activity   Alcohol use: Never   Drug use: Never   Sexual activity: Not on file  Other Topics Concern   Not on file  Social History Narrative   Pt lives at home with spouse. Gabriel Rung) [phone] (patient's cell)   [name]'s cell  [phone]      Caffeine Use: 1 cups daily.Right handed.Disabled.Education - high schoolPatient has two adult children.   Social Determinants of Health   Financial Resource Strain: Not on file  Food Insecurity: Not on file  Transportation Needs: Not on file  Physical Activity: Not on file  Stress: Not on file  Social Connections: Not on file  Intimate Partner Violence: Not on file    Margie Ege, Edrick Oh, DNP  [facility] [address] [place], [place] [address] [phone]

## 2022-08-04 NOTE — Patient Instructions (Signed)
Continue current medications  Keep follow up with neurosurgery next week, let me know what Dr. Yetta Barre says

## 2022-08-08 ENCOUNTER — Other Ambulatory Visit (HOSPITAL_COMMUNITY): Payer: Self-pay | Admitting: Neurological Surgery

## 2022-08-08 DIAGNOSIS — M5416 Radiculopathy, lumbar region: Secondary | ICD-10-CM

## 2022-08-09 ENCOUNTER — Ambulatory Visit: Payer: PPO | Admitting: Cardiology

## 2022-08-09 ENCOUNTER — Encounter: Payer: Self-pay | Admitting: Cardiology

## 2022-08-09 ENCOUNTER — Other Ambulatory Visit (HOSPITAL_COMMUNITY): Payer: Self-pay | Admitting: Neurological Surgery

## 2022-08-09 VITALS — BP 132/80 | HR 63 | Ht 60.0 in | Wt 113.0 lb

## 2022-08-09 DIAGNOSIS — M5416 Radiculopathy, lumbar region: Secondary | ICD-10-CM

## 2022-08-09 DIAGNOSIS — I10 Essential (primary) hypertension: Secondary | ICD-10-CM | POA: Diagnosis not present

## 2022-08-09 DIAGNOSIS — E782 Mixed hyperlipidemia: Secondary | ICD-10-CM | POA: Diagnosis not present

## 2022-08-09 NOTE — Progress Notes (Signed)
ID:  Katrina Rivera, DOB 28-Jan-1946, MRN [id number]  PCP:  Noberto Retort, MD  Cardiologist:  Tessa Lerner, DO, [facility] (established care 08/09/22) Former Cardiology Providers: Dr. Clotilde Dieter  Date: 08/09/22 Last Office Visit: February 08, 2022  Chief Complaint  Patient presents with   Essential Hypertension   Follow-up    HPI  Katrina Rivera is a 77 y.o. Caucasian female whose past medical history and cardiovascular risk factors include: Hypertension, hyperlipidemia, diabetes, Aortic atherosclerosis, Addison's disease, fibromyalgia, multiple sclerosis.  Patient is accompanied by her husband at today's office visit.  She was referred to the practice for palpitations according to her and by the time she was evaluated her symptoms had resolved.  Given her age and cardiovascular risk factors she had undergone an echo and stress test while she was in the care of  my former partner Dr. Rozell Searing Custovic.   I am seeing her for the first time.  She is here for 47-month follow-up visit.  She denies anginal chest pain or heart failure symptoms.  She did not do well with losartan for blood pressure management.  She was recommended to be on Toprol-XL for palpitations.  However her primary care started her on propranolol which is helping her overall symptoms.   FUNCTIONAL STATUS: Limited, s/p back surgery in May 2024, walks w/ walker.    CARDIAC DATABASE: EKG: 12/27/2021: Sinus rhythm, 69 bpm, LAE, TWI consider lateral ischemia.  Echocardiogram: 01/27/2022: Normal LV systolic function with visual EF 60-65%. Left ventricle cavity is normal in size. Mild concentric hypertrophy of the left ventricle. Normal global wall motion. Doppler evidence of grade I (impaired) diastolic dysfunction, normal LAP. Calculated EF 68%. Structurally normal mitral valve.  Mild to moderate mitral regurgitation. Structurally normal tricuspid valve.  Moderate tricuspid regurgitation. No evidence of pulmonary  hypertension. Pericardium is normal. Trace pericardial effusion. There is no hemodynamic significance. No prior available for comparison.   Stress Testing: Lexiscan Tetrofosmin stress test 01/27/2022: 1 Day Rest/Stress protocol. Stress EKG is non-diagnostic due to target heart rate not achieved and 10/10 chest pain during stress. Chest pain: Yes Hypertensive response to exercise: No, present at baseline (rest 190/80, peak 180/68). Normal myocardial perfusion without convincing evidence of reversible ischemia or prior infarct. Left ventricular size normal, wall thickness preserved, calculated LVEF 62%.  No prior studies available for comparison Low risk study, clinical correlation required due to precordial pain during pharmacological stress and uncontrolled hypertension.   ALLERGIES: Allergies  Allergen Reactions   Amoxicillin Other (See Comments)    Upset stomach   Aspirin     bruising   Azithromycin     stomach upset   Doxycycline Hyclate     GI upset   Hydroxyzine Hcl Other (See Comments)   Buspirone Hcl Palpitations   Mirtazapine Palpitations    Weight gain    MEDICATION LIST PRIOR TO VISIT: Current Meds  Medication Sig   acetaminophen (TYLENOL) 500 MG tablet Take 1,000 mg by mouth every 6 (six) hours as needed for moderate pain.   acetaminophen (TYLENOL) 650 MG CR tablet Take 650 mg by mouth every 8 (eight) hours as needed for pain.   alfuzosin (UROXATRAL) 10 MG 24 hr tablet Take 10 mg by mouth daily with breakfast.   Ascorbic Acid (VITAMIN C PO) Take 1 tablet by mouth daily.   B Complex Vitamins (VITAMIN B COMPLEX PO) Take 1 capsule by mouth in the morning.   cephALEXin (KEFLEX) 250 MG capsule Take 250 mg by mouth  every other day.   cholecalciferol (VITAMIN D3) 25 MCG (1000 UNIT) tablet Take 1,000 Units by mouth daily.   clonazePAM (KLONOPIN) 0.5 MG tablet Take 1 tablet (0.5 mg total) by mouth at bedtime as needed for anxiety.   CRANBERRY PO Take 1 tablet by mouth 2  (two) times daily.   dicyclomine (BENTYL) 10 MG capsule Take 10 mg by mouth 3 (three) times daily.   DULoxetine (CYMBALTA) 60 MG capsule Take 1 capsule (60 mg total) by mouth daily.   Erenumab-aooe (AIMOVIG) 70 MG/ML SOAJ Inject 70 mg into the skin every 30 (thirty) days.   ferrous sulfate 325 (65 FE) MG tablet Take 325 mg by mouth daily.   fludrocortisone (FLORINEF) 0.1 MG tablet Take 1 tablet daily   fluticasone (CUTIVATE) 0.05 % cream Apply 1 application  topically 2 (two) times daily as needed for irritation.   fluticasone (FLONASE) 50 MCG/ACT nasal spray Place 1 spray into both nostrils daily as needed for allergies or rhinitis.   gabapentin (NEURONTIN) 300 MG capsule Take 2-3 tablets at bedtime   HORIZANT 600 MG TBCR TAKE ONE TABLET BY MOUTH AT BEDTIME   HYDROcodone-acetaminophen (NORCO) 7.5-325 MG tablet Take 1 tablet by mouth every 4 (four) hours as needed for moderate pain ((score 4 to 6)).   LINZESS 72 MCG capsule Take 72 mcg by mouth every morning.   methylPREDNISolone (MEDROL) 4 MG tablet as directed Orally 1 tablet AM and 1/2 tablet afternoon   Multiple Vitamin (MULTIVITAMIN) tablet Take 1 tablet by mouth in the morning.   OVER THE COUNTER MEDICATION Take 1 capsule by mouth at bedtime. Sleep 3   Polyethyl Glycol-Propyl Glycol (SYSTANE OP) Place 2 drops into both eyes daily as needed (for dry eyes).   polyethylene glycol (MIRALAX) 17 g packet Take 17 g by mouth 2 (two) times daily. 17 grams in 6 oz of favorite drink twice a day until bowel movement.  LAXITIVE.  Restart if two days since last bowel movement (Patient taking differently: Take 17 g by mouth daily as needed for moderate constipation.)   Probiotic Product (PROBIOTIC PO) Take 1 capsule by mouth daily.   promethazine (PHENERGAN) 25 MG tablet Take 25 mg by mouth every 6 (six) hours as needed for nausea or vomiting.   propranolol (INDERAL) 10 MG tablet Take 10 mg by mouth 3 (three) times daily as needed (anxiety/palpitations).  Currently taking it twice a day   Rimegepant Sulfate (NURTEC) 75 MG TBDP Take 1 tab at onset of migraine.  May repeat in 2 hrs, if needed.  Max dose: 2 tabs/day. This is a 30 day prescription.   scopolamine (TRANSDERM-SCOP) 1 MG/3DAYS Place 1 patch onto the skin every 3 (three) days.   tiZANidine (ZANAFLEX) 2 MG tablet Take 2 tablets (4 mg total) by mouth every 8 (eight) hours as needed for muscle spasms.     PAST MEDICAL HISTORY: Past Medical History:  Diagnosis Date   Addison's disease (HCC)    takes Solu Cortef daily   Anemia    takes Ferrous Sulfate daily   Anxiety    takes Xanax nightly   Arthritis    Chronic back pain    stenosis   Depression    takes Cymbalta daily   Dizziness    if b/p drops    Fibromyalgia    Fibromyalgia    History of blood transfusion    no abnormal reaction noted   History of bronchitis    many yrs ago    Hypokalemia  takes Potassium daily   Hypotension    Hypotension    takes Florinef daily   IBS (irritable bowel syndrome)    takes Align daily   Insomnia    takes Trazodone nightly   Joint pain    Multiple sclerosis (HCC)    doesn't take any meds   Multiple sclerosis (HCC)    New onset headache 12/22/2021   Nocturia    Osteoporosis    Palpitations    Peripheral neuropathy    Primary localized osteoarthritis of left knee 08/11/2020   Restless leg syndrome    Seasonal allergies    takes Zyrtec daily;uses Flonase daily as needed   Syncope    when missed methylprednisolone dose   Urinary frequency    takes Flomax daily   Weakness    numbness and tingling    PAST SURGICAL HISTORY: Past Surgical History:  Procedure Laterality Date   ABDOMINAL HYSTERECTOMY  02/07/1986   APPENDECTOMY  02/07/1986   CESAREAN SECTION  1973/1977   x2   CHOLECYSTECTOMY  02/08/1995   COLECTOMY  02/08/1988   COLONOSCOPY     ESOPHAGOGASTRODUODENOSCOPY     EYE SURGERY     bilateral - /w IOL- cataracts   FRACTURE SURGERY Right    rods and  screws-right leg   LUMBAR LAMINECTOMY/DECOMPRESSION MICRODISCECTOMY Left 01/24/2013   Procedure: LUMBAR FIVE TO SACRAL ONE LUMBAR LAMINECTOMY/DECOMPRESSION MICRODISCECTOMY 1 LEVEL;  Surgeon: Tia Alert, MD;  Location: [facility];  Service: Neurosurgery;  Laterality: Left;   MAXIMUM ACCESS (MAS)POSTERIOR LUMBAR INTERBODY FUSION (PLIF) 1 LEVEL N/A 06/18/2014   Procedure: MAXIMUM ACCESS SURGERY POSTERIOR LUMBAR INTERBODY FUSION LUMBAR FIVE TO SACRAL ONE ;  Surgeon: Tia Alert, MD;  Location: [facility];  Service: Neurosurgery;  Laterality: N/A;   SPINAL CORD STIMULATOR BATTERY EXCHANGE Right 07/30/2021   Procedure: Spinal cord stimulator battery replacement;  Surgeon: Tia Alert, MD;  Location: [facility];  Service: Neurosurgery;  Laterality: Right;   SPINAL CORD STIMULATOR IMPLANT     TOTAL KNEE ARTHROPLASTY Left 08/24/2020   Procedure: TOTAL KNEE ARTHROPLASTY;  Surgeon: Salvatore Marvel, MD;  Location: [facility];  Service: Orthopedics;  Laterality: Left;    FAMILY HISTORY: The patient family history includes Heart attack in her father; Hypertension in her mother; Stroke in her mother; Tremor in her brother.  SOCIAL HISTORY:  The patient  reports that she has never smoked. She has never used smokeless tobacco. She reports that she does not drink alcohol and does not use drugs.  REVIEW OF SYSTEMS: Review of Systems  Cardiovascular:  Negative for chest pain, claudication, dyspnea on exertion, irregular heartbeat, leg swelling, near-syncope, orthopnea, palpitations, paroxysmal nocturnal dyspnea and syncope.  Respiratory:  Negative for shortness of breath.   Hematologic/Lymphatic: Negative for bleeding problem.  Musculoskeletal:  Positive for back pain and joint pain. Negative for muscle cramps and myalgias.  Neurological:  Negative for dizziness and light-headedness.    PHYSICAL EXAM:    08/09/2022   10:05 AM 08/04/2022    2:13 PM 07/25/2022    1:35 PM  Vitals with BMI  Height 5\' 0"  5'  0"   Weight 113 lbs 114 lbs   BMI 22.07 22.26   Systolic 132 122 161  Diastolic 80 72 60  Pulse 63 74 66    Physical Exam  Constitutional: No distress.  Age appropriate, hemodynamically stable, sitting upright, uses a walker for ambulation  Neck: No JVD present.  Cardiovascular: Normal rate, regular rhythm, S1 normal, S2 normal,  intact distal pulses and normal pulses. Exam reveals no gallop, no S3 and no S4.  No murmur heard. Pulmonary/Chest: Effort normal and breath sounds normal. No stridor. She has no wheezes. She has no rales.  Abdominal: Soft. Bowel sounds are normal. She exhibits no distension. There is no abdominal tenderness.  Musculoskeletal:        General: No edema.     Cervical back: Neck supple.  Neurological: She is alert and oriented to person, place, and time. She has intact cranial nerves (2-12).  Skin: Skin is warm and moist.  Generalized ecchymosis.     LABORATORY DATA:    Latest Ref Rng & Units 06/09/2022    9:08 AM 02/03/2022   10:06 PM 07/26/2021    9:31 AM  CBC  WBC 4.0 - 10.5 K/uL 7.9  8.8  9.2   Hemoglobin 12.0 - 15.0 g/dL 16.1  09.6  04.5   Hematocrit 36.0 - 46.0 % 40.9  38.7  37.4   Platelets 150 - 400 K/uL 222  265  192        Latest Ref Rng & Units 07/21/2022    2:16 PM 06/09/2022    9:08 AM 04/18/2022    9:52 AM  CMP  Glucose 70 - 99 mg/dL 409  88  95   BUN 6 - 23 mg/dL 20  25  20    Creatinine 0.40 - 1.20 mg/dL 8.11  9.14  7.82   Sodium 135 - 145 mEq/L 138  141  139   Potassium 3.5 - 5.1 mEq/L 4.9  4.4  4.1   Chloride 96 - 112 mEq/L 99  100  101   CO2 19 - 32 mEq/L 31  29  29    Calcium 8.4 - 10.5 mg/dL 95.6  21.3  08.6     No results found for: "CHOL", "HDL", "LDLCALC", "LDLDIRECT", "TRIG", "CHOLHDL" No components found for: "NTPROBNP" No results for input(s): "PROBNP" in the last 8760 hours. Recent Labs    12/22/21 1438  TSH 1.19    BMP Recent Labs    02/03/22 2206 04/18/22 0952 06/09/22 0908 07/21/22 1416  NA 139 139 141  138  K 3.7 4.1 4.4 4.9  CL 109 101 100 99  CO2 22 29 29 31   GLUCOSE 106* 95 88 100*  BUN 18 20 25* 20  CREATININE 1.16* 1.05 1.05* 0.98  CALCIUM 10.3 11.4* 10.6* 10.5  GFRNONAA 49*  --  56*  --     HEMOGLOBIN A1C No results found for: "HGBA1C", "MPG"  IMPRESSION:    ICD-10-CM   1. Essential hypertension  I10     2. Mixed hyperlipidemia  E78.2        RECOMMENDATIONS: Katrina Rivera is a 77 y.o. Caucasian female whose past medical history and cardiac risk factors include:  Hypertension, hyperlipidemia, diabetes, Aortic atherosclerosis, Addison's disease, fibromyalgia, multiple sclerosis.  Patient was referred to the practice for palpitations.  However by the time she was evaluated in consult by my partner her symptoms had resolved and therefore no additional workup was undertaken.  However given her risk factors and symptoms she did undergo echo and stress test.  Results reviewed with her and her husband in detail and noted above for further reference.  Since the stress test she has not had any reoccurrence of precordial pain.  She states that the discomfort she had during her MPI was likely secondary to anxiety.  No additional cardiovascular workup is warranted.  She was started on losartan in  the past but did not tolerate it well for hypertension.  She has adrenal insufficiency and therefore may have elevated readings while sitting but then describes orthostasis when she stands up quickly.  For right now we will hold off on blood pressure medications as office blood pressures are well-controlled.  She also has a documented history of hyperlipidemia and prior CT studies have also noted aortic atherosclerosis.  I do not have the most recent lipid profile for review.  However I have asked her to update her lipids with PCP and if clinically warranted would recommend statin therapy.  Patient is agreeable with the plan of care.  Reemphasized the importance of secondary prevention with  focus on improving her modifiable cardiovascular risk factors such as glycemic control, lipid management, blood pressure control, weight loss.  I would like to see her back on as-needed basis.  Patient is agreeable with the plan of care.   FINAL MEDICATION LIST END OF ENCOUNTER: No orders of the defined types were placed in this encounter.   There are no discontinued medications.   Current Outpatient Medications:    acetaminophen (TYLENOL) 500 MG tablet, Take 1,000 mg by mouth every 6 (six) hours as needed for moderate pain., Disp: , Rfl:    acetaminophen (TYLENOL) 650 MG CR tablet, Take 650 mg by mouth every 8 (eight) hours as needed for pain., Disp: , Rfl:    alfuzosin (UROXATRAL) 10 MG 24 hr tablet, Take 10 mg by mouth daily with breakfast., Disp: , Rfl:    Ascorbic Acid (VITAMIN C PO), Take 1 tablet by mouth daily., Disp: , Rfl:    B Complex Vitamins (VITAMIN B COMPLEX PO), Take 1 capsule by mouth in the morning., Disp: , Rfl:    cephALEXin (KEFLEX) 250 MG capsule, Take 250 mg by mouth every other day., Disp: , Rfl:    cholecalciferol (VITAMIN D3) 25 MCG (1000 UNIT) tablet, Take 1,000 Units by mouth daily., Disp: , Rfl:    clonazePAM (KLONOPIN) 0.5 MG tablet, Take 1 tablet (0.5 mg total) by mouth at bedtime as needed for anxiety., Disp: 30 tablet, Rfl: 1   CRANBERRY PO, Take 1 tablet by mouth 2 (two) times daily., Disp: , Rfl:    dicyclomine (BENTYL) 10 MG capsule, Take 10 mg by mouth 3 (three) times daily., Disp: , Rfl:    DULoxetine (CYMBALTA) 60 MG capsule, Take 1 capsule (60 mg total) by mouth daily., Disp: 90 capsule, Rfl: 3   Erenumab-aooe (AIMOVIG) 70 MG/ML SOAJ, Inject 70 mg into the skin every 30 (thirty) days., Disp: 1.12 mL, Rfl: 11   ferrous sulfate 325 (65 FE) MG tablet, Take 325 mg by mouth daily., Disp: , Rfl:    fludrocortisone (FLORINEF) 0.1 MG tablet, Take 1 tablet daily, Disp: 90 tablet, Rfl: 0   fluticasone (CUTIVATE) 0.05 % cream, Apply 1 application  topically 2  (two) times daily as needed for irritation., Disp: , Rfl:    fluticasone (FLONASE) 50 MCG/ACT nasal spray, Place 1 spray into both nostrils daily as needed for allergies or rhinitis., Disp: , Rfl:    gabapentin (NEURONTIN) 300 MG capsule, Take 2-3 tablets at bedtime, Disp: 90 capsule, Rfl: 5   HORIZANT 600 MG TBCR, TAKE ONE TABLET BY MOUTH AT BEDTIME, Disp: 90 tablet, Rfl: 4   HYDROcodone-acetaminophen (NORCO) 7.5-325 MG tablet, Take 1 tablet by mouth every 4 (four) hours as needed for moderate pain ((score 4 to 6))., Disp: 40 tablet, Rfl: 0   LINZESS 72 MCG capsule, Take  72 mcg by mouth every morning., Disp: , Rfl:    methylPREDNISolone (MEDROL) 4 MG tablet, as directed Orally 1 tablet AM and 1/2 tablet afternoon, Disp: 135 tablet, Rfl: 1   Multiple Vitamin (MULTIVITAMIN) tablet, Take 1 tablet by mouth in the morning., Disp: , Rfl:    OVER THE COUNTER MEDICATION, Take 1 capsule by mouth at bedtime. Sleep 3, Disp: , Rfl:    Polyethyl Glycol-Propyl Glycol (SYSTANE OP), Place 2 drops into both eyes daily as needed (for dry eyes)., Disp: , Rfl:    polyethylene glycol (MIRALAX) 17 g packet, Take 17 g by mouth 2 (two) times daily. 17 grams in 6 oz of favorite drink twice a day until bowel movement.  LAXITIVE.  Restart if two days since last bowel movement (Patient taking differently: Take 17 g by mouth daily as needed for moderate constipation.), Disp: 14 packet, Rfl: 0   Probiotic Product (PROBIOTIC PO), Take 1 capsule by mouth daily., Disp: , Rfl:    promethazine (PHENERGAN) 25 MG tablet, Take 25 mg by mouth every 6 (six) hours as needed for nausea or vomiting., Disp: , Rfl:    propranolol (INDERAL) 10 MG tablet, Take 10 mg by mouth 3 (three) times daily as needed (anxiety/palpitations). Currently taking it twice a day, Disp: , Rfl:    Rimegepant Sulfate (NURTEC) 75 MG TBDP, Take 1 tab at onset of migraine.  May repeat in 2 hrs, if needed.  Max dose: 2 tabs/day. This is a 30 day prescription., Disp: 10  tablet, Rfl: 5   scopolamine (TRANSDERM-SCOP) 1 MG/3DAYS, Place 1 patch onto the skin every 3 (three) days., Disp: , Rfl:    tiZANidine (ZANAFLEX) 2 MG tablet, Take 2 tablets (4 mg total) by mouth every 8 (eight) hours as needed for muscle spasms., Disp: 60 tablet, Rfl: 1  No orders of the defined types were placed in this encounter.   There are no Patient Instructions on file for this visit.   --Continue cardiac medications as reconciled in final medication list. --Return if symptoms worsen or fail to improve. or sooner if needed. --Continue follow-up with your primary care physician regarding the management of your other chronic comorbid conditions.  Patient's questions and concerns were addressed to her satisfaction. She voices understanding of the instructions provided during this encounter.   This note was created using a voice recognition software as a result there may be grammatical errors inadvertently enclosed that do not reflect the nature of this encounter. Every attempt is made to correct such errors.  Tessa Lerner, [place], [facility]  Pager:  [phone] Office: [phone]

## 2022-08-12 ENCOUNTER — Ambulatory Visit
Admission: RE | Admit: 2022-08-12 | Discharge: 2022-08-12 | Disposition: A | Discharge status: Home / Self Care | Payer: PPO | Source: Ambulatory Visit | Attending: Neurological Surgery | Admitting: Neurological Surgery

## 2022-08-12 DIAGNOSIS — M5126 Other intervertebral disc displacement, lumbar region: Secondary | ICD-10-CM | POA: Diagnosis not present

## 2022-08-12 DIAGNOSIS — M5416 Radiculopathy, lumbar region: Secondary | ICD-10-CM | POA: Diagnosis not present

## 2022-08-12 DIAGNOSIS — S32049A Unspecified fracture of fourth lumbar vertebra, initial encounter for closed fracture: Secondary | ICD-10-CM | POA: Diagnosis not present

## 2022-08-12 DIAGNOSIS — S32059A Unspecified fracture of fifth lumbar vertebra, initial encounter for closed fracture: Secondary | ICD-10-CM | POA: Diagnosis not present

## 2022-08-16 ENCOUNTER — Encounter (HOSPITAL_COMMUNITY): Payer: Self-pay | Admitting: Neurological Surgery

## 2022-08-16 ENCOUNTER — Encounter: Payer: Self-pay | Admitting: Neurology

## 2022-08-16 ENCOUNTER — Other Ambulatory Visit: Payer: Self-pay

## 2022-08-16 ENCOUNTER — Other Ambulatory Visit: Payer: Self-pay | Admitting: Neurological Surgery

## 2022-08-16 NOTE — Progress Notes (Signed)
Called Dr Hart Rochester in Anesthesia regarding late add-on case.  Patient has had a recent Echo (01/27/22) which was normal, stress test (01/27/22), and EKG (12/27/21) - SR with left atrial enlargement.  Patient does not have any cardiac hx or respiratory history.  Patient has MS history and has been in remission for years (takes no meds).  Patient states "she ambulates well with a walker".  Dr Hart Rochester stated for Anesthesia to Review on DOS.

## 2022-08-16 NOTE — Progress Notes (Signed)
SDW call  Patient was given pre-op instructions over the phone. Patient verbalized understanding of instructions provided.     PCP - Dr. [name] Cardiologist - Dr. Tessa Lerner Pulmonary:    PPM/ICD - No, but patient has a spinal cord stimulator.  Instructed to bring her remote  Chest x-ray - n/a EKG -  12/27/2021 Stress Test - 01/27/2022 ECHO - 01/27/2022 Cardiac Cath -   Sleep Study/sleep apnea/CPAP: denies  Non-diabetic   Blood Thinner Instructions: denies Aspirin Instructions:denies   ERAS Protcol - Yes, clear fluids until 1000 PRE-SURGERY Ensure or G2-    COVID TEST- n/a    Anesthesia review: Yes. Multiple sclerosis, left atrial enlargement.  Dr. Hart Rochester made aware   Patient denies shortness of breath, fever, cough and chest pain over the phone call  Your procedure is scheduled on Wednesday August 17, 2022   Report to [facility] Main Entrance "A" at  1030  A.M., then check in with the Admitting office.  Call this number if you have problems the morning of surgery:  [phone]   If you have any questions prior to your surgery date call [phone]: Open Monday-Friday 8am-4pm If you experience any cold or flu symptoms such as cough, fever, chills, shortness of breath, etc. between now and your scheduled surgery, please notify us at the above number    Remember:  Do not eat after midnight the night before your surgery  You may drink clear liquids until 1000 the morning of your surgery.   Clear liquids allowed are: Water, Non-Citrus Juices (without pulp), Carbonated Beverages, Clear Tea, Black Coffee ONLY (NO MILK, CREAM OR POWDERED CREAMER of any kind), and Gatorade   Take these medicines the morning of surgery with A SIP OF WATER:  Uroxatrol, cymbalta, methylprednisolone  As needed: Tylenol, flonase, Norco  As of today, STOP taking any Aspirin (unless otherwise instructed by your surgeon) Aleve, Naproxen, Ibuprofen, Motrin, Advil, Goody's, BC's, all  herbal medications, fish oil, and all vitamins.

## 2022-08-17 ENCOUNTER — Ambulatory Visit (HOSPITAL_COMMUNITY): Payer: PPO

## 2022-08-17 ENCOUNTER — Encounter (HOSPITAL_COMMUNITY): Payer: Self-pay | Admitting: Neurological Surgery

## 2022-08-17 ENCOUNTER — Ambulatory Visit (HOSPITAL_COMMUNITY): Payer: PPO | Admitting: Certified Registered"

## 2022-08-17 ENCOUNTER — Inpatient Hospital Stay (HOSPITAL_COMMUNITY)
Admission: RE | Admit: 2022-08-17 | Discharge: 2022-08-19 | DRG: 460 | Disposition: A | Discharge status: Rehabilitation Facility | Payer: PPO | Attending: Neurological Surgery | Admitting: Neurological Surgery

## 2022-08-17 ENCOUNTER — Other Ambulatory Visit: Payer: Self-pay

## 2022-08-17 ENCOUNTER — Encounter (HOSPITAL_COMMUNITY)
Admission: RE | Disposition: A | Discharge status: Rehabilitation Facility | Payer: Self-pay | Source: Home / Self Care | Attending: Neurological Surgery

## 2022-08-17 DIAGNOSIS — K589 Irritable bowel syndrome without diarrhea: Secondary | ICD-10-CM | POA: Diagnosis not present

## 2022-08-17 DIAGNOSIS — Z888 Allergy status to other drugs, medicaments and biological substances status: Secondary | ICD-10-CM | POA: Diagnosis not present

## 2022-08-17 DIAGNOSIS — Z7952 Long term (current) use of systemic steroids: Secondary | ICD-10-CM

## 2022-08-17 DIAGNOSIS — J302 Other seasonal allergic rhinitis: Secondary | ICD-10-CM | POA: Diagnosis present

## 2022-08-17 DIAGNOSIS — M797 Fibromyalgia: Secondary | ICD-10-CM | POA: Diagnosis present

## 2022-08-17 DIAGNOSIS — Z79899 Other long term (current) drug therapy: Secondary | ICD-10-CM | POA: Diagnosis not present

## 2022-08-17 DIAGNOSIS — S32039D Unspecified fracture of third lumbar vertebra, subsequent encounter for fracture with routine healing: Secondary | ICD-10-CM | POA: Diagnosis not present

## 2022-08-17 DIAGNOSIS — S32039A Unspecified fracture of third lumbar vertebra, initial encounter for closed fracture: Secondary | ICD-10-CM

## 2022-08-17 DIAGNOSIS — G35 Multiple sclerosis: Secondary | ICD-10-CM | POA: Diagnosis present

## 2022-08-17 DIAGNOSIS — W19XXXA Unspecified fall, initial encounter: Secondary | ICD-10-CM | POA: Diagnosis present

## 2022-08-17 DIAGNOSIS — Z90710 Acquired absence of both cervix and uterus: Secondary | ICD-10-CM | POA: Diagnosis not present

## 2022-08-17 DIAGNOSIS — Z9682 Presence of neurostimulator: Secondary | ICD-10-CM

## 2022-08-17 DIAGNOSIS — M545 Low back pain, unspecified: Secondary | ICD-10-CM | POA: Diagnosis present

## 2022-08-17 DIAGNOSIS — E876 Hypokalemia: Secondary | ICD-10-CM | POA: Diagnosis not present

## 2022-08-17 DIAGNOSIS — Z472 Encounter for removal of internal fixation device: Secondary | ICD-10-CM | POA: Diagnosis not present

## 2022-08-17 DIAGNOSIS — G2581 Restless legs syndrome: Secondary | ICD-10-CM | POA: Diagnosis not present

## 2022-08-17 DIAGNOSIS — M549 Dorsalgia, unspecified: Secondary | ICD-10-CM | POA: Diagnosis present

## 2022-08-17 DIAGNOSIS — Z9049 Acquired absence of other specified parts of digestive tract: Secondary | ICD-10-CM | POA: Diagnosis not present

## 2022-08-17 DIAGNOSIS — M792 Neuralgia and neuritis, unspecified: Secondary | ICD-10-CM | POA: Diagnosis not present

## 2022-08-17 DIAGNOSIS — G4700 Insomnia, unspecified: Secondary | ICD-10-CM | POA: Diagnosis not present

## 2022-08-17 DIAGNOSIS — R339 Retention of urine, unspecified: Secondary | ICD-10-CM | POA: Diagnosis not present

## 2022-08-17 DIAGNOSIS — E782 Mixed hyperlipidemia: Secondary | ICD-10-CM | POA: Diagnosis not present

## 2022-08-17 DIAGNOSIS — N319 Neuromuscular dysfunction of bladder, unspecified: Secondary | ICD-10-CM | POA: Diagnosis not present

## 2022-08-17 DIAGNOSIS — S32038A Other fracture of third lumbar vertebra, initial encounter for closed fracture: Secondary | ICD-10-CM | POA: Diagnosis not present

## 2022-08-17 DIAGNOSIS — R269 Unspecified abnormalities of gait and mobility: Secondary | ICD-10-CM | POA: Diagnosis not present

## 2022-08-17 DIAGNOSIS — Z981 Arthrodesis status: Secondary | ICD-10-CM | POA: Diagnosis not present

## 2022-08-17 DIAGNOSIS — G8929 Other chronic pain: Secondary | ICD-10-CM | POA: Diagnosis not present

## 2022-08-17 DIAGNOSIS — I959 Hypotension, unspecified: Secondary | ICD-10-CM | POA: Diagnosis not present

## 2022-08-17 DIAGNOSIS — Z8249 Family history of ischemic heart disease and other diseases of the circulatory system: Secondary | ICD-10-CM

## 2022-08-17 DIAGNOSIS — M1990 Unspecified osteoarthritis, unspecified site: Secondary | ICD-10-CM | POA: Diagnosis present

## 2022-08-17 DIAGNOSIS — R296 Repeated falls: Secondary | ICD-10-CM | POA: Diagnosis not present

## 2022-08-17 DIAGNOSIS — S32001A Stable burst fracture of unspecified lumbar vertebra, initial encounter for closed fracture: Secondary | ICD-10-CM | POA: Diagnosis not present

## 2022-08-17 DIAGNOSIS — Z886 Allergy status to analgesic agent status: Secondary | ICD-10-CM

## 2022-08-17 DIAGNOSIS — E271 Primary adrenocortical insufficiency: Secondary | ICD-10-CM | POA: Diagnosis not present

## 2022-08-17 DIAGNOSIS — G629 Polyneuropathy, unspecified: Secondary | ICD-10-CM | POA: Diagnosis present

## 2022-08-17 DIAGNOSIS — D649 Anemia, unspecified: Secondary | ICD-10-CM | POA: Diagnosis not present

## 2022-08-17 DIAGNOSIS — Z96652 Presence of left artificial knee joint: Secondary | ICD-10-CM | POA: Diagnosis present

## 2022-08-17 DIAGNOSIS — Z881 Allergy status to other antibiotic agents status: Secondary | ICD-10-CM

## 2022-08-17 DIAGNOSIS — R350 Frequency of micturition: Secondary | ICD-10-CM | POA: Diagnosis present

## 2022-08-17 DIAGNOSIS — F419 Anxiety disorder, unspecified: Secondary | ICD-10-CM | POA: Diagnosis not present

## 2022-08-17 DIAGNOSIS — F32A Depression, unspecified: Secondary | ICD-10-CM

## 2022-08-17 DIAGNOSIS — G43909 Migraine, unspecified, not intractable, without status migrainosus: Secondary | ICD-10-CM | POA: Diagnosis not present

## 2022-08-17 DIAGNOSIS — M542 Cervicalgia: Secondary | ICD-10-CM | POA: Diagnosis not present

## 2022-08-17 DIAGNOSIS — Z823 Family history of stroke: Secondary | ICD-10-CM | POA: Diagnosis not present

## 2022-08-17 DIAGNOSIS — K582 Mixed irritable bowel syndrome: Secondary | ICD-10-CM | POA: Diagnosis not present

## 2022-08-17 DIAGNOSIS — Z880 Allergy status to penicillin: Secondary | ICD-10-CM | POA: Diagnosis not present

## 2022-08-17 DIAGNOSIS — I10 Essential (primary) hypertension: Secondary | ICD-10-CM

## 2022-08-17 DIAGNOSIS — S32038D Other fracture of third lumbar vertebra, subsequent encounter for fracture with routine healing: Secondary | ICD-10-CM | POA: Diagnosis not present

## 2022-08-17 LAB — TYPE AND SCREEN
ABO/RH(D): A POS
Antibody Screen: NEGATIVE

## 2022-08-17 LAB — CBC
HCT: 34.4 % — ABNORMAL LOW (ref 36.0–46.0)
Hemoglobin: 10.9 g/dL — ABNORMAL LOW (ref 12.0–15.0)
MCH: 31.5 pg (ref 26.0–34.0)
MCHC: 31.7 g/dL (ref 30.0–36.0)
MCV: 99.4 fL (ref 80.0–100.0)
Platelets: 179 10*3/uL (ref 150–400)
RBC: 3.46 MIL/uL — ABNORMAL LOW (ref 3.87–5.11)
RDW: 13.2 % (ref 11.5–15.5)
WBC: 7.3 10*3/uL (ref 4.0–10.5)
nRBC: 0 % (ref 0.0–0.2)

## 2022-08-17 SURGERY — POSTERIOR LUMBAR FUSION 1 LEVEL
Anesthesia: General | Site: Back

## 2022-08-17 MED ORDER — ONDANSETRON HCL 4 MG PO TABS: 4.0000 mg | ORAL_TABLET | Freq: Four times a day (QID) | ORAL | Status: DC | PRN

## 2022-08-17 MED ORDER — TIZANIDINE HCL 4 MG PO TABS
4.0000 mg | ORAL_TABLET | Freq: Three times a day (TID) | ORAL | Status: DC | PRN
  Administered 2022-08-18 (×2): 4 mg via ORAL
  Filled 2022-08-17 (×2): qty 1

## 2022-08-17 MED ORDER — LACTATED RINGERS IV SOLN: INTRAVENOUS | Status: DC

## 2022-08-17 MED ORDER — VANCOMYCIN HCL IN DEXTROSE 1-5 GM/200ML-% IV SOLN
1000.0000 mg | INTRAVENOUS | Status: AC
  Administered 2022-08-17: 1000 mg via INTRAVENOUS
  Filled 2022-08-17: qty 200

## 2022-08-17 MED ORDER — BUPIVACAINE HCL (PF) 0.25 % IJ SOLN
INTRAMUSCULAR | Status: DC | PRN
  Administered 2022-08-17: 10 mL

## 2022-08-17 MED ORDER — OXYCODONE HCL 5 MG/5ML PO SOLN: 5.0000 mg | Freq: Once | ORAL | Status: DC | PRN

## 2022-08-17 MED ORDER — AMISULPRIDE (ANTIEMETIC) 5 MG/2ML IV SOLN
10.0000 mg | Freq: Once | INTRAVENOUS | Status: AC | PRN
  Administered 2022-08-17: 10 mg via INTRAVENOUS

## 2022-08-17 MED ORDER — FLUDROCORTISONE ACETATE 0.1 MG PO TABS
0.0500 mg | ORAL_TABLET | Freq: Every day | ORAL | Status: DC
  Administered 2022-08-17 – 2022-08-19 (×3): 0.05 mg via ORAL
  Filled 2022-08-17 (×3): qty 0.5

## 2022-08-17 MED ORDER — ACETAMINOPHEN 500 MG PO TABS: 1000.0000 mg | ORAL_TABLET | Freq: Once | ORAL | Status: DC

## 2022-08-17 MED ORDER — CEFAZOLIN SODIUM-DEXTROSE 2-4 GM/100ML-% IV SOLN
2.0000 g | Freq: Three times a day (TID) | INTRAVENOUS | Status: AC
  Administered 2022-08-17 – 2022-08-18 (×2): 2 g via INTRAVENOUS
  Filled 2022-08-17 (×2): qty 100

## 2022-08-17 MED ORDER — DULOXETINE HCL 60 MG PO CPEP
60.0000 mg | ORAL_CAPSULE | Freq: Every day | ORAL | Status: DC
  Administered 2022-08-18 – 2022-08-19 (×2): 60 mg via ORAL
  Filled 2022-08-17 (×2): qty 1

## 2022-08-17 MED ORDER — ONDANSETRON HCL 4 MG/2ML IJ SOLN: 4.0000 mg | Freq: Four times a day (QID) | INTRAMUSCULAR | Status: DC | PRN

## 2022-08-17 MED ORDER — CHLORHEXIDINE GLUCONATE CLOTH 2 % EX PADS: 6.0000 | MEDICATED_PAD | Freq: Once | CUTANEOUS | Status: DC

## 2022-08-17 MED ORDER — DEXAMETHASONE SODIUM PHOSPHATE 10 MG/ML IJ SOLN
INTRAMUSCULAR | Status: AC
  Filled 2022-08-17: qty 1

## 2022-08-17 MED ORDER — DEXAMETHASONE SODIUM PHOSPHATE 10 MG/ML IJ SOLN
INTRAMUSCULAR | Status: DC | PRN
  Administered 2022-08-17: 10 mg via INTRAVENOUS

## 2022-08-17 MED ORDER — ONDANSETRON HCL 4 MG/2ML IJ SOLN: 4.0000 mg | Freq: Once | INTRAMUSCULAR | Status: DC | PRN

## 2022-08-17 MED ORDER — MENTHOL 3 MG MT LOZG: 1.0000 | LOZENGE | OROMUCOSAL | Status: DC | PRN

## 2022-08-17 MED ORDER — SUGAMMADEX SODIUM 200 MG/2ML IV SOLN
INTRAVENOUS | Status: DC | PRN
  Administered 2022-08-17: 100 mg via INTRAVENOUS

## 2022-08-17 MED ORDER — SODIUM CHLORIDE 0.9% FLUSH
3.0000 mL | Freq: Two times a day (BID) | INTRAVENOUS | Status: DC
  Administered 2022-08-17 – 2022-08-19 (×4): 3 mL via INTRAVENOUS

## 2022-08-17 MED ORDER — FENTANYL CITRATE (PF) 250 MCG/5ML IJ SOLN
INTRAMUSCULAR | Status: AC
  Filled 2022-08-17: qty 5

## 2022-08-17 MED ORDER — ALFUZOSIN HCL ER 10 MG PO TB24
10.0000 mg | ORAL_TABLET | Freq: Every day | ORAL | Status: DC
  Administered 2022-08-18 – 2022-08-19 (×2): 10 mg via ORAL
  Filled 2022-08-17 (×3): qty 1

## 2022-08-17 MED ORDER — CLONAZEPAM 0.5 MG PO TABS
0.5000 mg | ORAL_TABLET | Freq: Every day | ORAL | Status: DC
  Administered 2022-08-17 – 2022-08-18 (×2): 0.5 mg via ORAL
  Filled 2022-08-17 (×2): qty 1

## 2022-08-17 MED ORDER — CEPHALEXIN 250 MG PO CAPS
250.0000 mg | ORAL_CAPSULE | ORAL | Status: DC
  Administered 2022-08-19: 250 mg via ORAL
  Filled 2022-08-17: qty 1

## 2022-08-17 MED ORDER — EPHEDRINE 5 MG/ML INJ
INTRAVENOUS | Status: AC
  Filled 2022-08-17: qty 5

## 2022-08-17 MED ORDER — FERROUS SULFATE 325 (65 FE) MG PO TABS
325.0000 mg | ORAL_TABLET | Freq: Every day | ORAL | Status: DC
  Administered 2022-08-18 – 2022-08-19 (×2): 325 mg via ORAL
  Filled 2022-08-17 (×2): qty 1

## 2022-08-17 MED ORDER — ROCURONIUM BROMIDE 10 MG/ML (PF) SYRINGE
PREFILLED_SYRINGE | INTRAVENOUS | Status: DC | PRN
  Administered 2022-08-17: 60 mg via INTRAVENOUS
  Administered 2022-08-17: 20 mg via INTRAVENOUS

## 2022-08-17 MED ORDER — ORAL CARE MOUTH RINSE: 15.0000 mL | Freq: Once | OROMUCOSAL | Status: AC

## 2022-08-17 MED ORDER — SENNA 8.6 MG PO TABS
1.0000 | ORAL_TABLET | Freq: Two times a day (BID) | ORAL | Status: DC
  Administered 2022-08-17 – 2022-08-19 (×4): 8.6 mg via ORAL
  Filled 2022-08-17 (×4): qty 1

## 2022-08-17 MED ORDER — RISAQUAD PO CAPS
1.0000 | ORAL_CAPSULE | Freq: Every day | ORAL | Status: DC
  Administered 2022-08-18 – 2022-08-19 (×2): 1 via ORAL
  Filled 2022-08-17 (×2): qty 1

## 2022-08-17 MED ORDER — MORPHINE SULFATE (PF) 2 MG/ML IV SOLN
2.0000 mg | INTRAVENOUS | Status: DC | PRN
  Administered 2022-08-17 – 2022-08-19 (×4): 2 mg via INTRAVENOUS
  Filled 2022-08-17 (×4): qty 1

## 2022-08-17 MED ORDER — ROCURONIUM BROMIDE 10 MG/ML (PF) SYRINGE
PREFILLED_SYRINGE | INTRAVENOUS | Status: AC
  Filled 2022-08-17: qty 10

## 2022-08-17 MED ORDER — LIDOCAINE 2% (20 MG/ML) 5 ML SYRINGE
INTRAMUSCULAR | Status: DC | PRN
  Administered 2022-08-17: 100 mg via INTRAVENOUS

## 2022-08-17 MED ORDER — ONDANSETRON HCL 4 MG/2ML IJ SOLN
INTRAMUSCULAR | Status: DC | PRN
  Administered 2022-08-17: 4 mg via INTRAVENOUS

## 2022-08-17 MED ORDER — FENTANYL CITRATE (PF) 100 MCG/2ML IJ SOLN
INTRAMUSCULAR | Status: DC | PRN
  Administered 2022-08-17: 50 ug via INTRAVENOUS

## 2022-08-17 MED ORDER — B COMPLEX-C PO TABS
1.0000 | ORAL_TABLET | Freq: Every day | ORAL | Status: DC
  Administered 2022-08-18 – 2022-08-19 (×2): 1 via ORAL
  Filled 2022-08-17 (×2): qty 1

## 2022-08-17 MED ORDER — THROMBIN 5000 UNITS EX SOLR
CUTANEOUS | Status: AC
  Filled 2022-08-17: qty 5000

## 2022-08-17 MED ORDER — POTASSIUM CHLORIDE IN NACL 20-0.9 MEQ/L-% IV SOLN: INTRAVENOUS | Status: DC

## 2022-08-17 MED ORDER — GABAPENTIN ENACARBIL ER 600 MG PO TBCR: 1.0000 | EXTENDED_RELEASE_TABLET | Freq: Every day | ORAL | Status: DC

## 2022-08-17 MED ORDER — PHENOL 1.4 % MT LIQD: 1.0000 | OROMUCOSAL | Status: DC | PRN

## 2022-08-17 MED ORDER — POLYETHYLENE GLYCOL 3350 17 G PO PACK: 17.0000 g | PACK | Freq: Every day | ORAL | Status: DC | PRN

## 2022-08-17 MED ORDER — AMISULPRIDE (ANTIEMETIC) 5 MG/2ML IV SOLN
INTRAVENOUS | Status: AC
  Filled 2022-08-17: qty 4

## 2022-08-17 MED ORDER — DICYCLOMINE HCL 10 MG PO CAPS
20.0000 mg | ORAL_CAPSULE | Freq: Three times a day (TID) | ORAL | Status: DC
  Administered 2022-08-18 – 2022-08-19 (×6): 20 mg via ORAL
  Filled 2022-08-17 (×7): qty 2

## 2022-08-17 MED ORDER — CHLORHEXIDINE GLUCONATE 0.12 % MT SOLN
15.0000 mL | Freq: Once | OROMUCOSAL | Status: AC
  Administered 2022-08-17: 15 mL via OROMUCOSAL
  Filled 2022-08-17: qty 15

## 2022-08-17 MED ORDER — PHENYLEPHRINE HCL-NACL 20-0.9 MG/250ML-% IV SOLN
INTRAVENOUS | Status: DC | PRN
  Administered 2022-08-17: 30 ug/min via INTRAVENOUS

## 2022-08-17 MED ORDER — HYDROMORPHONE HCL 1 MG/ML IJ SOLN
0.2500 mg | INTRAMUSCULAR | Status: DC | PRN
  Administered 2022-08-17 (×2): 0.25 mg via INTRAVENOUS
  Administered 2022-08-17: 0.5 mg via INTRAVENOUS

## 2022-08-17 MED ORDER — THROMBIN 20000 UNITS EX SOLR
CUTANEOUS | Status: AC
  Filled 2022-08-17: qty 20000

## 2022-08-17 MED ORDER — ACETAMINOPHEN 500 MG PO TABS
500.0000 mg | ORAL_TABLET | Freq: Once | ORAL | Status: AC
  Administered 2022-08-17: 500 mg via ORAL
  Filled 2022-08-17: qty 1

## 2022-08-17 MED ORDER — 0.9 % SODIUM CHLORIDE (POUR BTL) OPTIME
TOPICAL | Status: DC | PRN
  Administered 2022-08-17: 1000 mL

## 2022-08-17 MED ORDER — HYDROCODONE-ACETAMINOPHEN 7.5-325 MG PO TABS
1.0000 | ORAL_TABLET | ORAL | Status: DC | PRN
  Administered 2022-08-17 – 2022-08-19 (×6): 1 via ORAL
  Filled 2022-08-17 (×6): qty 1

## 2022-08-17 MED ORDER — PROPOFOL 10 MG/ML IV BOLUS
INTRAVENOUS | Status: DC | PRN
  Administered 2022-08-17: 120 mg via INTRAVENOUS

## 2022-08-17 MED ORDER — SODIUM CHLORIDE 0.9% FLUSH
3.0000 mL | INTRAVENOUS | Status: DC | PRN
  Administered 2022-08-17: 3 mL via INTRAVENOUS

## 2022-08-17 MED ORDER — SODIUM CHLORIDE 0.9 % IV SOLN
250.0000 mL | INTRAVENOUS | Status: DC
  Administered 2022-08-17: 250 mL via INTRAVENOUS

## 2022-08-17 MED ORDER — OXYCODONE HCL 5 MG PO TABS: 5.0000 mg | ORAL_TABLET | Freq: Once | ORAL | Status: DC | PRN

## 2022-08-17 MED ORDER — EPHEDRINE SULFATE-NACL 50-0.9 MG/10ML-% IV SOSY
PREFILLED_SYRINGE | INTRAVENOUS | Status: DC | PRN
  Administered 2022-08-17 (×2): 5 mg via INTRAVENOUS

## 2022-08-17 MED ORDER — VITAMIN D 25 MCG (1000 UNIT) PO TABS
1000.0000 [IU] | ORAL_TABLET | Freq: Every day | ORAL | Status: DC
  Administered 2022-08-18 – 2022-08-19 (×2): 1000 [IU] via ORAL
  Filled 2022-08-17 (×2): qty 1

## 2022-08-17 MED ORDER — THROMBIN 20000 UNITS EX SOLR: CUTANEOUS | Status: DC | PRN

## 2022-08-17 MED ORDER — SURGIPHOR WOUND IRRIGATION SYSTEM - OPTIME: TOPICAL | Status: DC | PRN

## 2022-08-17 MED ORDER — LIDOCAINE 2% (20 MG/ML) 5 ML SYRINGE
INTRAMUSCULAR | Status: AC
  Filled 2022-08-17: qty 5

## 2022-08-17 MED ORDER — VITAMIN C 500 MG PO TABS
500.0000 mg | ORAL_TABLET | Freq: Every day | ORAL | Status: DC
  Administered 2022-08-17 – 2022-08-19 (×3): 500 mg via ORAL
  Filled 2022-08-17 (×4): qty 1

## 2022-08-17 MED ORDER — LINACLOTIDE 72 MCG PO CAPS: 72.0000 ug | ORAL_CAPSULE | Freq: Every day | ORAL | Status: DC | PRN

## 2022-08-17 MED ORDER — HYDROMORPHONE HCL 1 MG/ML IJ SOLN
INTRAMUSCULAR | Status: AC
  Filled 2022-08-17: qty 1

## 2022-08-17 MED ORDER — THROMBIN 5000 UNITS EX SOLR: OROMUCOSAL | Status: DC | PRN

## 2022-08-17 MED ORDER — ACETAMINOPHEN 325 MG PO TABS
650.0000 mg | ORAL_TABLET | ORAL | Status: DC | PRN
  Administered 2022-08-18 – 2022-08-19 (×2): 650 mg via ORAL
  Filled 2022-08-17 (×2): qty 2

## 2022-08-17 MED ORDER — PHENYLEPHRINE 80 MCG/ML (10ML) SYRINGE FOR IV PUSH (FOR BLOOD PRESSURE SUPPORT)
PREFILLED_SYRINGE | INTRAVENOUS | Status: AC
  Filled 2022-08-17: qty 10

## 2022-08-17 MED ORDER — PHENYLEPHRINE 80 MCG/ML (10ML) SYRINGE FOR IV PUSH (FOR BLOOD PRESSURE SUPPORT)
PREFILLED_SYRINGE | INTRAVENOUS | Status: DC | PRN
  Administered 2022-08-17: 80 ug via INTRAVENOUS
  Administered 2022-08-17: 160 ug via INTRAVENOUS
  Administered 2022-08-17 (×2): 80 ug via INTRAVENOUS

## 2022-08-17 MED ORDER — BUPIVACAINE HCL (PF) 0.25 % IJ SOLN
INTRAMUSCULAR | Status: AC
  Filled 2022-08-17: qty 30

## 2022-08-17 MED ORDER — GABAPENTIN 300 MG PO CAPS
900.0000 mg | ORAL_CAPSULE | Freq: Every day | ORAL | Status: DC
  Administered 2022-08-17 – 2022-08-18 (×2): 900 mg via ORAL
  Filled 2022-08-17 (×2): qty 3

## 2022-08-17 MED ORDER — ONDANSETRON HCL 4 MG/2ML IJ SOLN
INTRAMUSCULAR | Status: AC
  Filled 2022-08-17: qty 2

## 2022-08-17 MED ORDER — ACETAMINOPHEN 650 MG RE SUPP: 650.0000 mg | RECTAL | Status: DC | PRN

## 2022-08-17 SURGICAL SUPPLY — 60 items
ADH SKN CLS APL DERMABOND .7 (GAUZE/BANDAGES/DRESSINGS) ×1
APL SKNCLS STERI-STRIP NONHPOA (GAUZE/BANDAGES/DRESSINGS) ×1
BAG COUNTER SPONGE SURGICOUNT (BAG) ×2 IMPLANT
BAG SPNG CNTER NS LX DISP (BAG) ×1
BENZOIN TINCTURE PRP APPL 2/3 (GAUZE/BANDAGES/DRESSINGS) ×2 IMPLANT
BIT DRILL PLIF MAS DISP 5.5MM (DRILL) IMPLANT
BLADE CLIPPER SURG (BLADE) IMPLANT
BUR CARBIDE MATCH 3.0 (BURR) ×2 IMPLANT
CANISTER SUCT 3000ML PPV (MISCELLANEOUS) ×2 IMPLANT
CNTNR URN SCR LID CUP LEK RST (MISCELLANEOUS) ×2 IMPLANT
CONT SPEC 4OZ STRL OR WHT (MISCELLANEOUS) ×1
COVER BACK TABLE 60X90IN (DRAPES) ×2 IMPLANT
DERMABOND ADVANCED .7 DNX12 (GAUZE/BANDAGES/DRESSINGS) ×2 IMPLANT
DRAPE C-ARM 42X72 X-RAY (DRAPES) ×4 IMPLANT
DRAPE C-ARMOR (DRAPES) ×2 IMPLANT
DRAPE LAPAROTOMY 100X72X124 (DRAPES) ×2 IMPLANT
DRAPE SURG 17X23 STRL (DRAPES) ×2 IMPLANT
DRILL PLIF MAS DISP 5.5MM (DRILL) ×1
DRSG OPSITE POSTOP 4X8 (GAUZE/BANDAGES/DRESSINGS) IMPLANT
DURAPREP 26ML APPLICATOR (WOUND CARE) ×2 IMPLANT
ELECT REM PT RETURN 9FT ADLT (ELECTROSURGICAL) ×1
ELECTRODE REM PT RTRN 9FT ADLT (ELECTROSURGICAL) ×2 IMPLANT
EVACUATOR 1/8 PVC DRAIN (DRAIN) ×2 IMPLANT
FIBER BONE ALLOSYNC EXPAND 5 (Bone Implant) IMPLANT
GAUZE 4X4 16PLY ~~LOC~~+RFID DBL (SPONGE) IMPLANT
GLOVE BIO SURGEON STRL SZ7 (GLOVE) IMPLANT
GLOVE BIO SURGEON STRL SZ8 (GLOVE) ×4 IMPLANT
GLOVE BIOGEL PI IND STRL 7.0 (GLOVE) IMPLANT
GOWN STRL REUS W/ TWL LRG LVL3 (GOWN DISPOSABLE) IMPLANT
GOWN STRL REUS W/ TWL XL LVL3 (GOWN DISPOSABLE) ×4 IMPLANT
GOWN STRL REUS W/TWL 2XL LVL3 (GOWN DISPOSABLE) IMPLANT
GOWN STRL REUS W/TWL LRG LVL3 (GOWN DISPOSABLE)
GOWN STRL REUS W/TWL XL LVL3 (GOWN DISPOSABLE) ×2
GRAFT BONE PROTEIOS XL 10CC (Orthopedic Implant) IMPLANT
HEMOSTAT POWDER KIT SURGIFOAM (HEMOSTASIS) ×2 IMPLANT
KIT BASIN OR (CUSTOM PROCEDURE TRAY) ×2 IMPLANT
KIT POSITION SURG JACKSON T1 (MISCELLANEOUS) ×2 IMPLANT
KIT TURNOVER KIT B (KITS) ×2 IMPLANT
NDL HYPO 25X1 1.5 SAFETY (NEEDLE) ×2 IMPLANT
NEEDLE HYPO 25X1 1.5 SAFETY (NEEDLE) ×1 IMPLANT
NS IRRIG 1000ML POUR BTL (IV SOLUTION) ×2 IMPLANT
PACK LAMINECTOMY NEURO (CUSTOM PROCEDURE TRAY) ×2 IMPLANT
PAD ARMBOARD 7.5X6 YLW CONV (MISCELLANEOUS) ×6 IMPLANT
ROD PLIF MAS 65MM (Rod) IMPLANT
SCREW LOCK (Screw) ×6 IMPLANT
SCREW LOCK FXNS SPNE MAS PL (Screw) IMPLANT
SCREW SHANK 5.5X40MM (Screw) IMPLANT
SCREW TULIP 5.5 (Screw) IMPLANT
SOLUTION IRRIG SURGIPHOR (IV SOLUTION) ×2 IMPLANT
SPONGE SURGIFOAM ABS GEL 100 (HEMOSTASIS) ×2 IMPLANT
SPONGE T-LAP 4X18 ~~LOC~~+RFID (SPONGE) IMPLANT
STRIP CLOSURE SKIN 1/2X4 (GAUZE/BANDAGES/DRESSINGS) ×4 IMPLANT
SUT VIC AB 0 CT1 18XCR BRD8 (SUTURE) ×2 IMPLANT
SUT VIC AB 0 CT1 8-18 (SUTURE) ×1
SUT VIC AB 2-0 CP2 18 (SUTURE) ×2 IMPLANT
SUT VIC AB 3-0 SH 8-18 (SUTURE) ×4 IMPLANT
TOWEL GREEN STERILE (TOWEL DISPOSABLE) ×2 IMPLANT
TOWEL GREEN STERILE FF (TOWEL DISPOSABLE) ×2 IMPLANT
TRAY FOLEY MTR SLVR 16FR STAT (SET/KITS/TRAYS/PACK) ×2 IMPLANT
WATER STERILE IRR 1000ML POUR (IV SOLUTION) ×2 IMPLANT

## 2022-08-17 NOTE — Anesthesia Preprocedure Evaluation (Signed)
Anesthesia Evaluation  Patient identified by MRN, date of birth, ID band Patient awake    Reviewed: Allergy & Precautions, H&P , NPO status , Patient's Chart, lab work & pertinent test results  Airway Mallampati: II  TM Distance: >3 FB Neck ROM: Full    Dental no notable dental hx.    Pulmonary neg pulmonary ROS   Pulmonary exam normal breath sounds clear to auscultation       Cardiovascular hypertension, Normal cardiovascular exam Rhythm:Regular Rate:Normal     Neuro/Psych    Depression    MS  Neuromuscular disease  negative psych ROS   GI/Hepatic negative GI ROS, Neg liver ROS,,,  Endo/Other  Addisons disease  Renal/GU negative Renal ROS  negative genitourinary   Musculoskeletal  (+)  Fibromyalgia -  Abdominal   Peds negative pediatric ROS (+)  Hematology negative hematology ROS (+)   Anesthesia Other Findings   Reproductive/Obstetrics negative OB ROS                             Anesthesia Physical Anesthesia Plan  ASA: 3  Anesthesia Plan: General   Post-op Pain Management: Tylenol PO (pre-op)*   Induction: Intravenous  PONV Risk Score and Plan: 3 and Ondansetron, Dexamethasone and Treatment may vary due to age or medical condition  Airway Management Planned: Oral ETT  Additional Equipment:   Intra-op Plan:   Post-operative Plan: Extubation in OR  Informed Consent: I have reviewed the patients History and Physical, chart, labs and discussed the procedure including the risks, benefits and alternatives for the proposed anesthesia with the patient or authorized representative who has indicated his/her understanding and acceptance.     Dental advisory given  Plan Discussed with: CRNA and Surgeon  Anesthesia Plan Comments:        Anesthesia Quick Evaluation

## 2022-08-17 NOTE — Op Note (Signed)
08/17/2022  3:49 PM  PATIENT:  Katrina Rivera  77 y.o. female  PRE-OPERATIVE DIAGNOSIS: L3 spinal fracture after fall, recent L3-4 fusion  POST-OPERATIVE DIAGNOSIS:  same  PROCEDURE:     1. Posterior fixation L2-L4 using NuVasive cortical pedicle screws.  2. Intertransverse arthrodesis L2-L3 using morcellized allograft. 3.  Removal of segmental fixation L3-L5  SURGEON:  Marikay Alar, MD  ASSISTANTS: Verlin Dike, FNP  ANESTHESIA:  General  EBL: 35 ml  Total I/O In: -  Out: 75 [Blood:75]  BLOOD ADMINISTERED: None  DRAINS: none   INDICATION FOR PROCEDURE: This patient presented with back pain after a fall. Imaging revealed L3 fracture through the posterior vertebral body and pedicles just above her L3 pedicle screws after recent L3-L5 fusion. The patient tried a reasonable attempt at conservative medical measures without relief. I recommended decompression and instrumented fusion to address the stenosis as well as the segmental  instability.  Patient understood the risks, benefits, and alternatives and potential outcomes and wished to proceed.  PROCEDURE DETAILS:  The patient was brought to the operating room. After induction of generalized endotracheal anesthesia the patient was rolled into the prone position on chest rolls and all pressure points were padded. The patient's lumbar region was cleaned and then prepped with DuraPrep and draped in the usual sterile fashion. Anesthesia was injected and then a dorsal midline incision was made and carried down to the lumbosacral fascia. The fascia was opened and the paraspinous musculature was taken down in a subperiosteal fashion to expose L2-3 as well as of the deviously placed instrumentation L3-L5 bilaterally.  As expected, the L3 pedicle screws were loose in the pedicles but the L4 and L5 pedicle screws had excellent purchase.  A self-retaining retractor was placed. Intraoperative fluoroscopy confirmed my level, and I started with  placement of the L2 cortical pedicle screws. The pedicle screw entry zones were identified utilizing surface landmarks and  AP and lateral fluoroscopy. I scored the cortex with the high-speed drill and then used the hand drill to drill an upward and outward direction into the pedicle. I then tapped line to line. I then placed a 5.5 x 40 mm cortical pedicle screw into the pedicles of L2 bilaterally.      We then decorticated the transverse processes of L2 and L3 bilaterally and laid a mixture of morcellized  allograft out over these to perform intertransverse arthrodesis at L2 and L3 bilaterally. We then placed lordotic rods into the multiaxial screw heads of the pedicle screws of L2-L3 and L4 and locked these in position with the locking caps and anti-torque device.  Did not place the rod in the L5 pedicles screws as we felt this may be place undue stress on the L2 pedicles and create a moment arm.  We then checked our construct with AP and lateral fluoroscopy. Irrigated with copious amounts of 0.5% povidone iodine solution followed by saline solution.  then we closed the muscle and the fascia with 0 Vicryl. Closed the subcutaneous tissues with 2-0 Vicryl and subcuticular tissues with 3-0 Vicryl. The skin was closed with benzoin and Steri-Strips. Dressing was then applied, the patient was awakened from general anesthesia and transported to the recovery room in stable condition. At the end of the procedure all sponge, needle and instrument counts were correct.   PLAN OF CARE: admit to inpatient  PATIENT DISPOSITION:  PACU - hemodynamically stable.   Delay start of Pharmacological VTE agent (>24hrs) due to surgical blood loss or risk of  bleeding:  yes

## 2022-08-17 NOTE — Plan of Care (Signed)

## 2022-08-17 NOTE — Progress Notes (Signed)
New Admission Note:   Arrival Method: Wheelchair Mental Orientation: Alert and oriented  Telemetry: none  Assessment: Completed Skin:Bilateral Bruising to feet and arms  IV: 18 G left Forearm  Pain: Tubes: none  Safety Measures: Safety Fall Prevention Plan has been given, discussed and signed Admission: Completed [facility] Orientation: Patient has been orientated to the room, unit and staff.  Family: husband   Orders have been reviewed and implemented. Will continue to monitor the patient. Call light has been placed within reach and bed alarm has been activated.   Norman Clay RN  [facility] [employer] [facility] Phone: [phone]

## 2022-08-17 NOTE — H&P (Signed)
Subjective: Patient is a 77 y.o. female admitted for L3 fracture after a fall. Onset of symptoms was a few weeks ago, unchanged since that time.  The pain is rated severe, and is located at the across the lower back and radiates to legs. The pain is described as aching and occurs all day. The symptoms have been progressive. Symptoms are exacerbated by exercise and standing. MRI or CT showed fracture of L3 body above her L3 pedicle screws. She has MS and has fallen multiple times   Past Medical History:  Diagnosis Date   Addison's disease (HCC)    takes Solu Cortef daily   Anemia    takes Ferrous Sulfate daily   Anxiety    takes Xanax nightly   Arthritis    Chronic back pain    stenosis   Depression    takes Cymbalta daily   Dizziness    if b/p drops    Fibromyalgia    History of blood transfusion    no abnormal reaction noted   History of bronchitis    many yrs ago    Hypokalemia    takes Potassium daily   Hypotension    takes Florinef daily   IBS (irritable bowel syndrome)    takes Align daily   Insomnia    takes Trazodone nightly   Joint pain    Multiple sclerosis (HCC)    doesn't take any meds   Multiple sclerosis (HCC)    New onset headache 12/22/2021   Nocturia    Osteoporosis    Palpitations    Peripheral neuropathy    Primary localized osteoarthritis of left knee 08/11/2020   Restless leg syndrome    Seasonal allergies    takes Zyrtec daily;uses Flonase daily as needed   Syncope    when missed methylprednisolone dose   Urinary frequency    takes Flomax daily   Weakness    numbness and tingling    Past Surgical History:  Procedure Laterality Date   ABDOMINAL HYSTERECTOMY  02/07/1986   APPENDECTOMY  02/07/1986   CESAREAN SECTION  1973/1977   x2   CHOLECYSTECTOMY  02/08/1995   COLECTOMY  02/08/1988   COLONOSCOPY     ESOPHAGOGASTRODUODENOSCOPY     EYE SURGERY     bilateral - /w IOL- cataracts   FRACTURE SURGERY Right    rods and screws-right leg    LUMBAR LAMINECTOMY/DECOMPRESSION MICRODISCECTOMY Left 01/24/2013   Procedure: LUMBAR FIVE TO SACRAL ONE LUMBAR LAMINECTOMY/DECOMPRESSION MICRODISCECTOMY 1 LEVEL;  Surgeon: Tia Alert, MD;  Location: [facility];  Service: Neurosurgery;  Laterality: Left;   MAXIMUM ACCESS (MAS)POSTERIOR LUMBAR INTERBODY FUSION (PLIF) 1 LEVEL N/A 06/18/2014   Procedure: MAXIMUM ACCESS SURGERY POSTERIOR LUMBAR INTERBODY FUSION LUMBAR FIVE TO SACRAL ONE ;  Surgeon: Tia Alert, MD;  Location: [facility];  Service: Neurosurgery;  Laterality: N/A;   SPINAL CORD STIMULATOR BATTERY EXCHANGE Right 07/30/2021   Procedure: Spinal cord stimulator battery replacement;  Surgeon: Tia Alert, MD;  Location: [facility];  Service: Neurosurgery;  Laterality: Right;   SPINAL CORD STIMULATOR IMPLANT     TOTAL KNEE ARTHROPLASTY Left 08/24/2020   Procedure: TOTAL KNEE ARTHROPLASTY;  Surgeon: Salvatore Marvel, MD;  Location: [facility] OR[facility];  Service: Orthopedics;  Laterality: Left;    Prior to Admission medications   Medication Sig Start Date End Date Taking? Authorizing Provider  alfuzosin (UROXATRAL) 10 MG 24 hr tablet Take 10 mg by mouth daily with breakfast.   Yes [provider]  Ascorbic Acid (VITAMIN C PO) Take 1 tablet by mouth every morning.   Yes [provider]  B Complex Vitamins (VITAMIN B COMPLEX PO) Take 1 capsule by mouth in the morning.   Yes [provider]  cephALEXin (KEFLEX) 250 MG capsule Take 250 mg by mouth every other day. 06/04/22  Yes [provider]  cholecalciferol (VITAMIN D3) 25 MCG (1000 UNIT) tablet Take 1,000 Units by mouth every morning.   Yes [provider]  clonazePAM (KLONOPIN) 0.5 MG tablet Take 1 tablet (0.5 mg total) by mouth at bedtime as needed for anxiety. Patient taking differently: Take 0.5 mg by mouth at bedtime. 08/04/22  Yes Glean Salvo, NP  CRANBERRY PO Take 1 tablet by mouth 2 (two) times daily.   Yes [provider]  dicyclomine  (BENTYL) 10 MG capsule Take 20 mg by mouth 3 (three) times daily before meals.   Yes [provider]  DULoxetine (CYMBALTA) 60 MG capsule Take 1 capsule (60 mg total) by mouth daily. 05/17/22  Yes Levert Feinstein, MD  Erenumab-aooe (AIMOVIG) 70 MG/ML SOAJ Inject 70 mg into the skin every 30 (thirty) days. 06/07/22  Yes Levert Feinstein, MD  ferrous sulfate 325 (65 FE) MG tablet Take 325 mg by mouth daily with breakfast.   Yes [provider]  fludrocortisone (FLORINEF) 0.1 MG tablet Take 1 tablet daily Patient taking differently: Take 0.05 mg by mouth daily. 04/20/22  Yes Reather Littler, MD  fluticasone (CUTIVATE) 0.05 % cream Apply 1 application  topically 2 (two) times daily as needed for irritation (Rosacea). 06/09/21  Yes [provider]  gabapentin (NEURONTIN) 300 MG capsule Take 2-3 tablets at bedtime Patient taking differently: Take 900 mg by mouth at bedtime. 08/04/22  Yes Glean Salvo, NP  HORIZANT 600 MG TBCR TAKE ONE TABLET BY MOUTH AT BEDTIME 12/13/21  Yes Levert Feinstein, MD  HYDROcodone-acetaminophen (NORCO) 7.5-325 MG tablet Take 1 tablet by mouth every 4 (four) hours as needed for moderate pain ((score 4 to 6)). 06/23/22  Yes Arman Bogus, MD  LINZESS 72 MCG capsule Take 72 mcg by mouth daily as needed (Constipation). 07/08/22  Yes [provider]  methylPREDNISolone (MEDROL) 4 MG tablet as directed Orally 1 tablet AM and 1/2 tablet afternoon 07/26/22  Yes Reather Littler, MD  Multiple Vitamin (MULTIVITAMIN) tablet Take 1 tablet by mouth in the morning.   Yes [provider]  OVER THE COUNTER MEDICATION Take 1 capsule by mouth at bedtime. Sleep 3   Yes [provider]  Polyethyl Glycol-Propyl Glycol (SYSTANE OP) Place 2 drops into both eyes daily as needed (for dry eyes).   Yes [provider]  polyethylene glycol (MIRALAX) 17 g packet Take 17 g by mouth 2 (two) times daily. 17 grams in 6 oz of favorite drink twice a day until bowel movement.   LAXITIVE.  Restart if two days since last bowel movement Patient taking differently: Take 17 g by mouth daily as needed for moderate constipation. 08/24/20  Yes Shepperson, Kirstin, PA-C  Probiotic Product (PROBIOTIC PO) Take 1 capsule by mouth every morning.   Yes [provider]  promethazine (PHENERGAN) 25 MG tablet Take 25 mg by mouth every 6 (six) hours as needed for nausea or vomiting.   Yes [provider]  tiZANidine (ZANAFLEX) 2 MG tablet Take 2 tablets (4 mg total) by mouth every 8 (eight) hours as needed for muscle spasms. Patient taking differently: Take 4 mg by mouth at bedtime. 06/23/22  Yes Arman Bogus, MD  acetaminophen (TYLENOL) 500 MG tablet Take 1,000 mg by mouth every 6 (six) hours as needed for moderate pain.    [provider]  fluticasone (FLONASE) 50 MCG/ACT nasal spray Place 1 spray into both nostrils daily as needed for allergies or rhinitis.    [provider]  Rimegepant Sulfate (NURTEC) 75 MG TBDP Take 1 tab at onset of migraine.  May repeat in 2 hrs, if needed.  Max dose: 2 tabs/day. This is a 30 day prescription. 06/07/22   Levert Feinstein, MD   Allergies  Allergen Reactions   Amoxicillin Other (See Comments)    Upset stomach   Aspirin     bruising   Azithromycin     stomach upset   Doxycycline Hyclate     GI upset   Hydroxyzine Hcl Other (See Comments)   Buspirone Hcl Palpitations   Mirtazapine Palpitations    Weight gain    Social History   Tobacco Use   Smoking status: Never   Smokeless tobacco: Never  Substance Use Topics   Alcohol use: Never    Family History  Problem Relation Age of Onset   Hypertension Mother    Stroke Mother    Heart attack Father    Tremor Brother      Review of Systems  Positive ROS: neg  All other systems have been reviewed and were otherwise negative with the exception of those mentioned in the HPI and as above.  Objective: Vital signs in last 24 hours: Temp:  [97.6 F (36.4  C)] 97.6 F (36.4 C) (07/10 1038) Pulse Rate:  [92] 92 (07/10 1038) Resp:  [18] 18 (07/10 1038) BP: (173)/(66) 173/66 (07/10 1038) SpO2:  [97 %] 97 % (07/10 1038) Weight:  [51.7 kg] 51.7 kg (07/10 1038)  General Appearance: Alert, cooperative, no distress, appears stated age Head: Normocephalic, without obvious abnormality, atraumatic Eyes: PERRL, conjunctiva/corneas clear, EOM's intact    Neck: Supple, symmetrical, trachea midline Back: Symmetric, no curvature, ROM normal, no CVA tenderness Lungs:  respirations unlabored Heart: Regular rate and rhythm Abdomen: Soft, non-tender Extremities: Extremities normal, atraumatic, no cyanosis or edema Pulses: 2+ and symmetric all extremities Skin: Skin color, texture, turgor normal, no rashes or lesions  NEUROLOGIC:   Mental status: Alert and oriented x4,  no aphasia, good attention span, fund of knowledge, and memory Motor Exam - grossly normal Sensory Exam - grossly normal Reflexes: 1= Coordination - grossly normal Gait - grossly normal Balance - grossly normal Cranial Nerves: I: smell Not tested  II: visual acuity  OS: nl    OD: nl  II: visual fields Full to confrontation  II: pupils Equal, round, reactive to light  III,VII: ptosis None  III,IV,VI: extraocular muscles  Full ROM  V: mastication Normal  V: facial light touch sensation  Normal  V,VII: corneal reflex  Present  VII: facial muscle function - upper  Normal  VII: facial muscle function - lower Normal  VIII: hearing Not tested  IX: soft palate elevation  Normal  IX,X: gag reflex Present  XI: trapezius strength  5/5  XI: sternocleidomastoid strength 5/5  XI: neck flexion strength  5/5  XII: tongue strength  Normal    Data Review Lab Results  Component Value Date   WBC 7.3 08/17/2022   HGB 10.9 (L) 08/17/2022   HCT 34.4 (L) 08/17/2022   MCV 99.4 08/17/2022   PLT 179 08/17/2022   Lab Results  Component Value Date   NA 138  07/21/2022   K 4.9 07/21/2022    CL 99 07/21/2022   CO2 31 07/21/2022   BUN 20 07/21/2022   CREATININE 0.98 07/21/2022   GLUCOSE 100 (H) 07/21/2022   Lab Results  Component Value Date   INR 0.9 06/09/2022    Assessment/Plan:  Estimated body mass index is 22.26 kg/m as calculated from the following:   Height as of this encounter: 5' (1.524 m).   Weight as of this encounter: 51.7 kg. Patient admitted for ORIF L36fx, with extension of fusion to L2. Patient has failed a reasonable attempt at conservative therapy.  I explained the condition and procedure to the patient and answered any questions.  Patient wishes to proceed with procedure as planned. Understands risks/ benefits and typical outcomes of procedure.   Tia Alert 08/17/2022 1:46 PM

## 2022-08-17 NOTE — Transfer of Care (Signed)
Immediate Anesthesia Transfer of Care Note  Patient: Katrina Rivera  Procedure(s) Performed: Lumbar Two-Three Posterior Lateral Fusion with Extension of Instrumentation (Back)  Patient Location: PACU  Anesthesia Type:General  Level of Consciousness: awake, alert , oriented, and patient cooperative  Airway & Oxygen Therapy: Patient Spontanous Breathing  Post-op Assessment: Report given to RN, Post -op Vital signs reviewed and stable, and Patient moving all extremities  Post vital signs: Reviewed and stable  Last Vitals:  Vitals Value Taken Time  BP 161/61 08/17/22 1602  Temp    Pulse 89 08/17/22 1605  Resp 21 08/17/22 1605  SpO2 100 % 08/17/22 1605  Vitals shown include unvalidated device data.  Last Pain:  Vitals:   08/17/22 1109  TempSrc:   PainSc: 8          Complications: No notable events documented.

## 2022-08-17 NOTE — Anesthesia Postprocedure Evaluation (Signed)
Anesthesia Post Note  Patient: Katrina Rivera  Procedure(s) Performed: Lumbar Two-Three Posterior Lateral Fusion with Extension of Instrumentation (Back)     Patient location during evaluation: PACU Anesthesia Type: General Level of consciousness: awake and alert Pain management: pain level controlled Vital Signs Assessment: post-procedure vital signs reviewed and stable Respiratory status: spontaneous breathing, nonlabored ventilation, respiratory function stable and patient connected to nasal cannula oxygen Cardiovascular status: blood pressure returned to baseline and stable Postop Assessment: no apparent nausea or vomiting Anesthetic complications: no   No notable events documented.  Last Vitals:  Vitals:   08/17/22 1615 08/17/22 1630  BP: (!) 140/54 128/63  Pulse: 82 95  Resp: 19 18  Temp:    SpO2: 97% 97%    Last Pain:  Vitals:   08/17/22 1630  TempSrc:   PainSc: 5     LLE Motor Response: Purposeful movement (08/17/22 1630) LLE Sensation: Full sensation (08/17/22 1630) RLE Motor Response: Purposeful movement (08/17/22 1630) RLE Sensation: Full sensation (08/17/22 1630)      Collene Schlichter

## 2022-08-17 NOTE — Anesthesia Procedure Notes (Signed)
Procedure Name: Intubation Date/Time: 08/17/2022 2:17 PM  Performed by: De Nurse, CRNAPre-anesthesia Checklist: Patient identified, Emergency Drugs available, Suction available and Patient being monitored Patient Re-evaluated:Patient Re-evaluated prior to induction Oxygen Delivery Method: Circle System Utilized Preoxygenation: Pre-oxygenation with 100% oxygen Induction Type: IV induction Ventilation: Mask ventilation without difficulty Laryngoscope Size: Mac and 3 Grade View: Grade I Tube type: Oral Tube size: 7.0 mm Number of attempts: 1 Airway Equipment and Method: Stylet and Oral airway Placement Confirmation: ETT inserted through vocal cords under direct vision, positive ETCO2 and breath sounds checked- equal and bilateral Secured at: 20 cm Tube secured with: Tape Dental Injury: Teeth and Oropharynx as per pre-operative assessment  Comments: Performed by Jim Like SRNA

## 2022-08-18 ENCOUNTER — Encounter: Payer: Self-pay | Admitting: Neurology

## 2022-08-18 DIAGNOSIS — Z981 Arthrodesis status: Secondary | ICD-10-CM | POA: Diagnosis not present

## 2022-08-18 NOTE — Evaluation (Signed)
Physical Therapy Evaluation Patient Details Name: RUKIA MCGILLIVRAY MRN: [id number] DOB: Nov 04, 1945 Today's Date: 08/18/2022  History of Present Illness  The pt is a 77 yo female presenting 7/10 for fusion of L2-4 and removal of segmental fixation of L3-5 to manage L3 spinal fx sustained in a fall. Pt s/p L3-4 PLIF on 06/22/22. PMH includes: MS, addison's disease, anemia, anxiety, arthritis, chronic back pain, depression, fibromyalgia, hypotension, IBS, peripheral neuropathy, syncope, spinal cord stimulator, and prior back surgery.   Clinical Impression  Pt in bed upon arrival of PT, agreeable to evaluation at this time. Prior to admission the pt was mobilizing with use of RW and intermittent assist from her spouse. She reports frequent falls (4 since surgery in May), one of which caused her to hit her head. The pt presents with weakness in BLE (worse in RLE compared to LLE) as well as chronic sensation changes that have worsened in LLE since last surgery. The pt required minA to complete log roll, and minA to complete 2 bouts of hallway ambulation (~75 ft) with use of RW. With any challenge of balance such as head turns, the pt reports onset of significant dizziness and needed additional assistance to maintain balance. With pt hx and symptom onset, question vestibular involvement and will recommend more formal vestibular assessment. Given high falls risk and current deficits, pt would benefit from intensive therapies to reduce fall risk and improve reactive balance strategies prior to return home with spouse.     Assistance Recommended at Discharge Frequent or constant Supervision/Assistance  If plan is discharge home, recommend the following:  Can travel by private vehicle  A little help with walking and/or transfers;A lot of help with bathing/dressing/bathroom;Assistance with cooking/housework;Direct supervision/assist for medications management;Direct supervision/assist for financial  management;Assist for transportation;Help with stairs or ramp for entrance        Equipment Recommendations None recommended by PT  Recommendations for Other Services  Rehab consult    Functional Status Assessment Patient has had a recent decline in their functional status and demonstrates the ability to make significant improvements in function in a reasonable and predictable amount of time.     Precautions / Restrictions Precautions Precautions: Fall;Back Precaution Booklet Issued: No Precaution Comments: recalled 2/3 from prior surgeries, pt with 4 falls since last surgery Required Braces or Orthoses: Spinal Brace Spinal Brace: Applied in sitting position;Lumbar corset Restrictions Weight Bearing Restrictions: No      Mobility  Bed Mobility Overal bed mobility: Needs Assistance Bed Mobility: Rolling, Sidelying to Sit Rolling: Min assist Sidelying to sit: Min assist       General bed mobility comments: minA to maintain log roll and minA to elevate trunk and prevent posterior LOB    Transfers Overall transfer level: Needs assistance Equipment used: Rolling walker (2 wheels) Transfers: Sit to/from Stand Sit to Stand: Min guard, Min assist           General transfer comment: minG to rise, minA to maintain balance with initial stand    Ambulation/Gait Ambulation/Gait assistance: Min guard, Min assist Gait Distance (Feet): 75 Feet (x2) Assistive device: Rolling walker (2 wheels) Gait Pattern/deviations: Step-to pattern, Step-through pattern, Shuffle, Knee flexed in stance - left, Knee flexed in stance - right Gait velocity: decreased Gait velocity interpretation: <1.31 ft/sec, indicative of household ambulator   General Gait Details: pt with knees flexed and small shuffling steps, minimal step through at times despite cues. no overt buckling in session. minA with head movements during gait  Stairs Stairs:  Yes Stairs assistance: Min assist Stair Management:  Two rails, Step to pattern, Forwards Number of Stairs: 4 General stair comments: max cues for RLE leading ascending and LLE leading descending. pt with little carryover    Balance Overall balance assessment: Needs assistance, History of Falls Sitting-balance support: Bilateral upper extremity supported, Feet supported Sitting balance-Leahy Scale: Poor Sitting balance - Comments: falling backwards on initial sit. needing min-modA to maintain, falling backwards with LE MMT Postural control: Posterior lean Standing balance support: Bilateral upper extremity supported, During functional activity Standing balance-Leahy Scale: Poor Standing balance comment: dependent on BUE support and minA with balance challenge                 Standardized Balance Assessment Standardized Balance Assessment : Dynamic Gait Index   Dynamic Gait Index Level Surface: Mild Impairment Change in Gait Speed: Moderate Impairment Gait with Horizontal Head Turns: Moderate Impairment Gait with Vertical Head Turns: Moderate Impairment Gait and Pivot Turn: Moderate Impairment Step Over Obstacle: Moderate Impairment Step Around Obstacles: Moderate Impairment Steps: Moderate Impairment Total Score: 9       Pertinent Vitals/Pain Pain Assessment Pain Assessment: Faces Faces Pain Scale: Hurts a little bit Pain Location: incision Pain Descriptors / Indicators: Tightness Pain Intervention(s): Limited activity within patient's tolerance, Monitored during session, Repositioned    Home Living Family/patient expects to be discharged to:: Private residence Living Arrangements: Spouse/significant other Available Help at Discharge: Family;Available 24 hours/day Type of Home: House Home Access: Stairs to enter Entrance Stairs-Rails: None Entrance Stairs-Number of Steps: 1   Home Layout: One level Home Equipment: Agricultural consultant (2 wheels);Rollator (4 wheels);Shower seat;Grab bars - toilet;BSC/3in1;Wheelchair -  manual      Prior Function Prior Level of Function : Needs assist             Mobility Comments: reports 4 falls since last surgery, using RW in the home. significant difficulty with 1 step into house ADLs Comments: reports spouse assists with showers and LB dressing and care. assist wit IADLs     Hand Dominance   Dominant Hand: Right    Extremity/Trunk Assessment   Upper Extremity Assessment Upper Extremity Assessment: Defer to OT evaluation    Lower Extremity Assessment Lower Extremity Assessment: Generalized weakness;RLE deficits/detail;LLE deficits/detail RLE Deficits / Details: grossly 3+/5 at ankle, knee, and hip. pt reports hx of sensation changes due to MS RLE Sensation: decreased light touch LLE Deficits / Details: grossly 3/5 to MMT with pt reporting limited sensation at baseline that has worsened since last surgery LLE Sensation: decreased light touch    Cervical / Trunk Assessment Cervical / Trunk Assessment: Back Surgery  Communication   Communication: No difficulties  Cognition Arousal/Alertness: Awake/alert Behavior During Therapy: WFL for tasks assessed/performed Overall Cognitive Status: Impaired/Different from baseline Area of Impairment: Orientation, Attention, Memory, Following commands, Safety/judgement, Awareness, Problem solving                 Orientation Level: Disoriented to, Time (patient thinking it is afternoon when early morning) Current Attention Level: Focused (easily distracted) Memory: Decreased recall of precautions, Decreased short-term memory Following Commands: Follows one step commands with increased time, Follows multi-step commands inconsistently Safety/Judgement: Decreased awareness of safety, Decreased awareness of deficits Awareness: Intellectual Problem Solving: Slow processing, Requires verbal cues General Comments: pt needing repeated verbal cues for safety, often not following instructions and question HOH vs poor  understanding of prompt. Pt often confusing L and R, perseverating on falling backwards and repeating story of the fall 4x  in session        General Comments General comments (skin integrity, edema, etc.): pt with 4 falls since last surgery, reports significant dizziness since first fall, question vestibular invomvement. pt with increased dizziness with head turns and with attempts at [facility] and HIT.    Exercises     Assessment/Plan    PT Assessment Patient needs continued PT services  PT Problem List Decreased strength;Decreased range of motion;Decreased balance;Decreased activity tolerance;Decreased mobility;Decreased coordination;Decreased cognition;Decreased safety awareness;Decreased knowledge of precautions       PT Treatment Interventions DME instruction;Stair training;Gait training;Functional mobility training;Therapeutic exercise;Balance training;Therapeutic activities;Neuromuscular re-education;Patient/family education    PT Goals (Current goals can be found in the Care Plan section)  Acute Rehab PT Goals Patient Stated Goal: return home without falling PT Goal Formulation: With patient Time For Goal Achievement: 09/01/22 Potential to Achieve Goals: Good    Frequency Min 5X/week        AM-PAC PT "6 Clicks" Mobility  Outcome Measure Help needed turning from your back to your side while in a flat bed without using bedrails?: A Little Help needed moving from lying on your back to sitting on the side of a flat bed without using bedrails?: A Little Help needed moving to and from a bed to a chair (including a wheelchair)?: A Little Help needed standing up from a chair using your arms (e.g., wheelchair or bedside chair)?: A Little Help needed to walk in hospital room?: A Little Help needed climbing 3-5 steps with a railing? : A Little 6 Click Score: 18    End of Session Equipment Utilized During Treatment: Gait belt;Back brace Activity Tolerance: Patient tolerated  treatment well Patient left: in chair;with call bell/phone within reach Nurse Communication: Mobility status PT Visit Diagnosis: Unsteadiness on feet (R26.81);Repeated falls (R29.6);Muscle weakness (generalized) (M62.81)    Time: 4098-1191 PT Time Calculation (min) (ACUTE ONLY): 37 min   Charges:   PT Evaluation $PT Eval Moderate Complexity: 1 Mod PT Treatments $Gait Training: 8-22 mins PT General Charges $$ ACUTE PT VISIT: 1 Visit         Vickki Muff, PT, DPT   Acute Rehabilitation Department Office [phone] Secure Chat Communication Preferred  Ronnie Derby 08/18/2022, 9:14 AM

## 2022-08-18 NOTE — Progress Notes (Signed)
Patient ID: Katrina Rivera, female   DOB: 07-Jul-1945, 77 y.o.   MRN: [id number] Doing well, some back soreness, no leg pain, good strength  MS flair with multiple falls leading to lumbar fracture requiring further surgery  Would possibly benefit from short stay in rehab?  PT/OT

## 2022-08-18 NOTE — PMR Pre-admission (Signed)
PMR Admission Coordinator Pre-Admission Assessment  Patient: Katrina Rivera is an 77 y.o., female MRN: [id number] DOB: [birth year] Height: 5' (152.4 cm) Weight: 51.7 kg              Insurance Information HMO:     PPO: yes     PCP:      IPA:      80/20:      OTHER:  PRIMARY: [employer]      Policy#: [id number]      Subscriber: pt CM Name: Tammy      Phone#: [phone]     Fax#: [phone]?EPIC access Pre-Cert#: [id number] for 10 days 7/12 until 7/22      Employer:  Benefits:  Phone #: [phone]     Name: 7/11 Eff. Date: 02/07/22     Deduct: none      Out of Pocket Max: $3200      Life Max: none  CIR: $295 co pay per day days 1 until 6      SNF: no co pay per day days 1 until 20; $203 co pay per day days 21 until 100 Outpatient: $15 per visit     Co-Pay: visits per medical neccesity Home Health: 100%      Co-Pay: visits per medical neccesity DME: 80%     Co-Pay: 20% Providers: in network  SECONDARY: none        Financial Counselor:       Phone#:   The Data processing manager" for patients in Inpatient Rehabilitation Facilities with attached "Privacy Act Statement-Health Care Records" was provided and verbally reviewed with: Patient and Family  Emergency Contact Information Contact Information   None on File    Other Contacts     Name Relation Home Work Mobile   [place]ison [phone]  [phone]      Current Medical History  Patient Admitting Diagnosis:L3 Fracture   History of Present Illness:  77 y.o. female With hx of Addison's disease, MS; Fibromyalgia, chronic low back pain; hypotension, depression, neuropathy, spinal cord stimulator;  with recent PLIF L3-L5- 06/22/22- who has fallen at home 4x since then- admitted 08/17/22 for scheduled surgery for L3 spinal fx from 1 of her falls-  She underwent removal of fixation from L3-L5 and had PLIF of L2-L4- with pedical screws due to fx on 7/10. Postoperatively with pain management with  no complications.   Patient's medical record from [facility] has been reviewed by the rehabilitation admission coordinator and physician.  Past Medical History  Past Medical History:  Diagnosis Date   Addison's disease (HCC)    takes Solu Cortef daily   Anemia    takes Ferrous Sulfate daily   Anxiety    takes Xanax nightly   Arthritis    Chronic back pain    stenosis   Depression    takes Cymbalta daily   Dizziness    if b/p drops    Fibromyalgia    History of blood transfusion    no abnormal reaction noted   History of bronchitis    many yrs ago    Hypokalemia    takes Potassium daily   Hypotension    takes Florinef daily   IBS (irritable bowel syndrome)    takes Align daily   Insomnia    takes Trazodone nightly   Joint pain    Multiple sclerosis (HCC)    doesn't take any meds   Multiple sclerosis (HCC)    New onset headache 12/22/2021  Nocturia    Osteoporosis    Palpitations    Peripheral neuropathy    Primary localized osteoarthritis of left knee 08/11/2020   Restless leg syndrome    Seasonal allergies    takes Zyrtec daily;uses Flonase daily as needed   Syncope    when missed methylprednisolone dose   Urinary frequency    takes Flomax daily   Weakness    numbness and tingling   Has the patient had major surgery during 100 days prior to admission? Yes  Family History  family history includes Heart attack in her father; Hypertension in her mother; Stroke in her mother; Tremor in her brother.  Current Medications   Current Facility-Administered Medications:    0.9 %  sodium chloride infusion, 250 mL, Intravenous, Continuous, Arman Bogus, MD, Last Rate: 1 mL/hr at 08/17/22 1847, 250 mL at 08/17/22 1847   0.9 % NaCl with KCl 20 mEq/ L  infusion, , Intravenous, Continuous, Arman Bogus, MD   acetaminophen (TYLENOL) tablet 650 mg, 650 mg, Oral, Q4H PRN, 650 mg at 08/19/22 0702 **OR** acetaminophen (TYLENOL) suppository 650 mg, 650  mg, Rectal, Q4H PRN, Arman Bogus, MD   acidophilus (RISAQUAD) capsule 1 capsule, 1 capsule, Oral, Daily, Arman Bogus, MD, 1 capsule at 08/19/22 0840   alfuzosin (UROXATRAL) 24 hr tablet 10 mg, 10 mg, Oral, Q breakfast, Arman Bogus, MD, 10 mg at 08/19/22 8841   ascorbic acid (VITAMIN C) tablet 500 mg, 500 mg, Oral, Daily, Arman Bogus, MD, 500 mg at 08/19/22 0840   B-complex with vitamin C tablet 1 tablet, 1 tablet, Oral, Daily, Arman Bogus, MD, 1 tablet at 08/19/22 6606   cephALEXin (KEFLEX) capsule 250 mg, 250 mg, Oral, Fonnie Mu, MD, 250 mg at 08/19/22 3016   cholecalciferol (VITAMIN D3) 25 MCG (1000 UNIT) tablet 1,000 Units, 1,000 Units, Oral, Daily, Arman Bogus, MD, 1,000 Units at 08/19/22 0840   clonazePAM (KLONOPIN) tablet 0.5 mg, 0.5 mg, Oral, QHS, Arman Bogus, MD, 0.5 mg at 08/18/22 2115   dicyclomine (BENTYL) capsule 20 mg, 20 mg, Oral, TID Hoy Register, MD, 20 mg at 08/19/22 0844   DULoxetine (CYMBALTA) DR capsule 60 mg, 60 mg, Oral, Daily, Arman Bogus, MD, 60 mg at 08/19/22 0840   ferrous sulfate tablet 325 mg, 325 mg, Oral, Q breakfast, Arman Bogus, MD, 325 mg at 08/19/22 0840   fludrocortisone (FLORINEF) tablet 0.05 mg, 0.05 mg, Oral, Daily, Arman Bogus, MD, 0.05 mg at 08/19/22 0848   gabapentin (NEURONTIN) capsule 900 mg, 900 mg, Oral, QHS, Arman Bogus, MD, 900 mg at 08/18/22 2115   HYDROcodone-acetaminophen (NORCO) 7.5-325 MG per tablet 1 tablet, 1 tablet, Oral, Q4H PRN, Arman Bogus, MD, 1 tablet at 08/19/22 0109   linaclotide (LINZESS) capsule 72 mcg, 72 mcg, Oral, Daily PRN, Arman Bogus, MD   menthol-cetylpyridinium (CEPACOL) lozenge 3 mg, 1 lozenge, Oral, PRN **OR** phenol (CHLORASEPTIC) mouth spray 1 spray, 1 spray, Mouth/Throat, PRN, Arman Bogus, MD   morphine (PF) 2 MG/ML injection 2 mg, 2 mg, Intravenous, Q2H PRN, Arman Bogus, MD, 2 mg at 08/19/22  0401   ondansetron (ZOFRAN) tablet 4 mg, 4 mg, Oral, Q6H PRN **OR** ondansetron (ZOFRAN) injection 4 mg, 4 mg, Intravenous, Q6H PRN, Arman Bogus, MD   polyethylene glycol (MIRALAX / GLYCOLAX) packet 17 g, 17 g, Oral, Daily PRN, Arman Bogus, MD   senna [facility]) tablet 8.6 mg, 1  tablet, Oral, BID, Arman Bogus, MD, 8.6 mg at 08/19/22 0840   sodium chloride flush (NS) 0.9 % injection 3 mL, 3 mL, Intravenous, Q12H, Arman Bogus, MD, 3 mL at 08/19/22 0846   sodium chloride flush (NS) 0.9 % injection 3 mL, 3 mL, Intravenous, PRN, Arman Bogus, MD, 3 mL at 08/17/22 2031   tiZANidine (ZANAFLEX) tablet 4 mg, 4 mg, Oral, Q8H PRN, Arman Bogus, MD, 4 mg at 08/18/22 2115  Patients Current Diet:  Diet Order             Diet - low sodium heart healthy           Diet regular Room service appropriate? Yes; Fluid consistency: Thin  Diet effective now                  Precautions / Restrictions Precautions Precautions: Fall, Back Precaution Booklet Issued: No Precaution Comments: recalled 2/3 from prior surgeries, pt with 4 falls since last surgery Spinal Brace: Applied in sitting position, Lumbar corset Restrictions Weight Bearing Restrictions: No   Has the patient had 2 or more falls or a fall with injury in the past year?Yes  Prior Activity Level Limited Community (1-2x/wk): spouse assist with adls  Prior Functional Level Prior Function Prior Level of Function : Needs assist Mobility Comments: reports 4 falls since last surgery, using RW in the home. significant difficulty with 1 step into house ADLs Comments: reports spouse assists with showers and LB dressing and care. assist wit IADLs  Self Care: Did the patient need help bathing, dressing, using the toilet or eating?  Needed some help  Indoor Mobility: Did the patient need assistance with walking from room to room (with or without device)? Needed some help  Stairs: Did the patient need  assistance with internal or external stairs (with or without device)? Needed some help  Functional Cognition: Did the patient need help planning regular tasks such as shopping or remembering to take medications? Independent  Patient Information Are you of Hispanic, Latino/a,or Spanish origin?: A. No, not of Hispanic, Latino/a, or Spanish origin What is your race?: A. White Do you need or want an interpreter to communicate with a doctor or health care staff?: 0. No  Patient's Response To:  Health Literacy and Transportation Is the patient able to respond to health literacy and transportation needs?: Yes Health Literacy - How often do you need to have someone help you when you read instructions, pamphlets, or other written material from your doctor or pharmacy?: Never In the past 12 months, has lack of transportation kept you from medical appointments or from getting medications?: No In the past 12 months, has lack of transportation kept you from meetings, work, or from getting things needed for daily living?: No  Home Assistive Devices / Equipment Home Assistive Devices/Equipment: Eyeglasses, Environmental consultant (specify type), Brace (specify type), Cane (specify quad or straight), Wheelchair, Blood pressure cuff, Grab bars around toilet, Grab bars in shower, Shower chair with back (back brace) Home Equipment: Agricultural consultant (2 wheels), Rollator (4 wheels), Shower seat, Grab bars - toilet, BSC/3in1, Wheelchair - manual  Prior Device Use: Indicate devices/aids used by the patient prior to current illness, exacerbation or injury? Walker  Current Functional Level Cognition  Overall Cognitive Status: Impaired/Different from baseline Current Attention Level: Sustained (easily distracted) Orientation Level: Oriented X4 Following Commands: Follows one step commands with increased time, Follows multi-step commands inconsistently Safety/Judgement: Decreased awareness of safety, Decreased awareness of  deficits General Comments:  found her walking out of bathroom unassisted on arrival; instructed pt to be patient after pulling the string and MUST be assisted    Extremity Assessment (includes Sensation/Coordination)  Upper Extremity Assessment: Overall WFL for tasks assessed  Lower Extremity Assessment: Defer to PT evaluation RLE Deficits / Details: grossly 3+/5 at ankle, knee, and hip. pt reports hx of sensation changes due to MS RLE Sensation: decreased light touch LLE Deficits / Details: grossly 3/5 to MMT with pt reporting limited sensation at baseline that has worsened since last surgery LLE Sensation: decreased light touch    ADLs  Overall ADL's : Needs assistance/impaired Eating/Feeding: Independent Grooming: Supervision/safety, Standing Upper Body Bathing: Set up, Sitting Lower Body Bathing: Moderate assistance, Sitting/lateral leans Upper Body Dressing : Set up Lower Body Dressing: Moderate assistance, Sit to/from stand Toilet Transfer: Min guard Toileting- Clothing Manipulation and Hygiene: Modified independent Functional mobility during ADLs: Min guard, Rolling walker (2 wheels) General ADL Comments: Pt overall good UB/LB strength, good balance but has frequent dizziness, no LOB during session. Limited with LB ADLs due to back pain and stiffness    Mobility  Overal bed mobility: Needs Assistance Bed Mobility: Rolling, Sidelying to Sit Rolling: Min assist Sidelying to sit: Mod assist Supine to sit: Min assist, HOB elevated General bed mobility comments: minA to maintain log roll and modA to elevate trunk    Transfers  Overall transfer level: Needs assistance Equipment used: Rolling walker (2 wheels) Transfers: Sit to/from Stand Sit to Stand: Min assist Bed to/from chair/wheelchair/BSC transfer type:: Step pivot Step pivot transfers: Min guard General transfer comment: vc for hand placement as pt trying to pull on RW to stand with it tipping posteriorly; steadying  assist as transitions hands up to RW    Ambulation / Gait / Stairs / Wheelchair Mobility  Ambulation/Gait Ambulation/Gait assistance: Min guard, Editor, commissioning (Feet): 120 Feet Assistive device: Rolling walker (2 wheels) Gait Pattern/deviations: Step-to pattern, Step-through pattern, Shuffle, Knee flexed in stance - left, Knee flexed in stance - right, Decreased stride length General Gait Details: pt with knees flexed and small shuffling steps, minimal step through at times despite cues. no overt buckling in session, although pt reports LLE buckling caused most recent 3 falls. Gait velocity: decreased Gait velocity interpretation: <1.8 ft/sec, indicate of risk for recurrent falls Stairs: Yes Stairs assistance: Min assist Stair Management: Two rails, Step to pattern, Forwards Number of Stairs: 4 General stair comments: max cues for RLE leading ascending and LLE leading descending. pt with little carryover    Posture / Balance Dynamic Sitting Balance Sitting balance - Comments: able to sit on EOB performing BUE ADLs once settled Balance Overall balance assessment: Needs assistance, History of Falls Sitting-balance support: Feet supported Sitting balance-Leahy Scale: Fair Sitting balance - Comments: able to sit on EOB performing BUE ADLs once settled Postural control: Posterior lean Standing balance support: During functional activity Standing balance-Leahy Scale: Poor Standing balance comment: after vestibular assessment, required bil UE support Standardized Balance Assessment Standardized Balance Assessment : Dynamic Gait Index Dynamic Gait Index Level Surface: Mild Impairment Change in Gait Speed: Moderate Impairment Gait with Horizontal Head Turns: Moderate Impairment Gait with Vertical Head Turns: Moderate Impairment Gait and Pivot Turn: Moderate Impairment Step Over Obstacle: Moderate Impairment Step Around Obstacles: Moderate Impairment Steps: Moderate  Impairment Total Score: 9    Special needs/care consideration Multiple Falls     Previous Home Environment  Living Arrangements: Spouse/significant other Available Help at Discharge: Family, Available 24 hours/day Type  of Home: House Home Layout: One level Home Access: Stairs to enter Entrance Stairs-Rails: None Entrance Stairs-Number of Steps: 1 Bathroom Shower/Tub: Associate Professor: Yes How Accessible: Accessible via walker Home Care Services:  (spouse does not recall name of HH) Additional Comments: lives at home with husband, has other family support if needed, one step to enter  Discharge Living Setting Plans for Discharge Living Setting: Patient's home, Lives with (comment) (spouse) Type of Home at Discharge: House Discharge Home Layout: One level Discharge Home Access: Stairs to enter Entrance Stairs-Rails: None Entrance Stairs-Number of Steps: 1 Discharge Bathroom Shower/Tub: Tub/shower unit Discharge Bathroom Toilet: Standard Discharge Bathroom Accessibility: Yes How Accessible: Accessible via walker Does the patient have any problems obtaining your medications?: No  Social/Family/Support Systems Patient Roles: Spouse Contact Information: spouse, Jomarie Longs Anticipated Caregiver: spouse Anticipated Industrial/product designer Information: see contacts Ability/Limitations of Caregiver: can provide light min assist Caregiver Availability: 24/7 Discharge Plan Discussed with Primary Caregiver: Yes Is Caregiver In Agreement with Plan?: Yes Does Caregiver/Family have Issues with Lodging/Transportation while Pt is in Rehab?: No  Goals Patient/Family Goal for Rehab: Mod I to superivsion with PT, superivsion to min OT Expected length of stay: ELOS 7 t0 10 Pt/Family Agrees to Admission and willing to participate: Yes Program Orientation Provided & Reviewed with Pt/Caregiver Including Roles  & Responsibilities: Yes  Decrease burden  of Care through IP rehab admission: n/a  Possible need for SNF placement upon discharge:not anticipated   Patient Condition: This patient's condition remains as documented in the consult dated 08/18/22, in which the Rehabilitation Physician determined and documented that the patient's condition is appropriate for intensive rehabilitative care in an inpatient rehabilitation facility. Will admit to inpatient rehab today.  Preadmission Screen Completed By:  Clois Dupes, RN MSN 08/19/2022 12:07 PM ______________________________________________________________________   Discussed status with Dr. Riley Kill on 08/19/22 at 1206 and received approval for admission today.  Admission Coordinator:  Clois Dupes RN MSN, time 1610 Date 08/19/22

## 2022-08-18 NOTE — Progress Notes (Signed)
  Inpatient Rehabilitation Admissions Coordinator   Met with patient and spouse at bedside for rehab assessment. We discussed goals and expectations of a possible CIR admit. They prefer CIR for rehab. She has had multiple falls since recent discharge and noted dizziness. Family can provide expected caregiver support that is recommended . Rehab MD to see patient today for consultation.  I will begin insurance Auth with [employer] for possible CIR admit pending approval. Please call me with any questions.   Ottie Glazier, RN, MSN Rehab Admissions Coordinator [phone]

## 2022-08-18 NOTE — Consult Note (Signed)
Physical Medicine and Rehabilitation Consult Reason for Consult:CIR Referring Physician: Dr Arman Bogus   HPI: Katrina Rivera is a 77 y.o. female With hx of Addison's disease, MS; Fibromyalgia, chronic low back pain; hypotension, depression, neuropathy, spinal cord stimulator;  with recent PLIF L3-L5- 06/22/22- who has fallen at home 4x since then- admitted 08/17/22 for scheduled surgery for L3 spinal fx from 1 of her falls-  She underwent removal of fixation from L3-L5 and had PLIF of L2-L4- with pedical screws due to fx.   Pt reports she falls backwards when falls- and OT noted she holds onto RW too close forward, which puts her at risk of fall.  She also notes she's used Baclofen in past for spasticity, but doesn't remember when stopped/ or why. Husband notes she is NOT as baseline cognitively, since last fall.   Per PT note, pt having poor carryover- shuffling gait- but walked 75 ft min Assist. However gait less than 1.3 ft per second so very slow- and knees flexed in stance.  Pt reports no BM since surgery, but passing gas.  Having significant dizziness when turns head back and forth- not worse with one side than the other that pt noticed.  Also has dizziness with up/down head movements, but not as bad.     Review of Systems  Constitutional:  Positive for malaise/fatigue. Negative for fever and weight loss.  HENT: Negative.    Eyes: Negative.   Respiratory:  Negative for cough and shortness of breath.   Cardiovascular:  Positive for leg swelling. Negative for palpitations and orthopnea.  Gastrointestinal:  Positive for constipation. Negative for abdominal pain and nausea.  Genitourinary: Negative.   Musculoskeletal:  Positive for back pain, falls and myalgias.  Skin: Negative.   Neurological:  Positive for dizziness, focal weakness and weakness. Negative for tremors and seizures.  Endo/Heme/Allergies:  Bruises/bleeds easily.  Psychiatric/Behavioral:  Negative for  depression and suicidal ideas. The patient is nervous/anxious.   All other systems reviewed and are negative.  Past Medical History:  Diagnosis Date   Addison's disease (HCC)    takes Solu Cortef daily   Anemia    takes Ferrous Sulfate daily   Anxiety    takes Xanax nightly   Arthritis    Chronic back pain    stenosis   Depression    takes Cymbalta daily   Dizziness    if b/p drops    Fibromyalgia    History of blood transfusion    no abnormal reaction noted   History of bronchitis    many yrs ago    Hypokalemia    takes Potassium daily   Hypotension    takes Florinef daily   IBS (irritable bowel syndrome)    takes Align daily   Insomnia    takes Trazodone nightly   Joint pain    Multiple sclerosis (HCC)    doesn't take any meds   Multiple sclerosis (HCC)    New onset headache 12/22/2021   Nocturia    Osteoporosis    Palpitations    Peripheral neuropathy    Primary localized osteoarthritis of left knee 08/11/2020   Restless leg syndrome    Seasonal allergies    takes Zyrtec daily;uses Flonase daily as needed   Syncope    when missed methylprednisolone dose   Urinary frequency    takes Flomax daily   Weakness    numbness and tingling   Past Surgical History:  Procedure Laterality Date  ABDOMINAL HYSTERECTOMY  02/07/1986   APPENDECTOMY  02/07/1986   CESAREAN SECTION  1973/1977   x2   CHOLECYSTECTOMY  02/08/1995   COLECTOMY  02/08/1988   COLONOSCOPY     ESOPHAGOGASTRODUODENOSCOPY     EYE SURGERY     bilateral - /w IOL- cataracts   FRACTURE SURGERY Right    rods and screws-right leg   LUMBAR LAMINECTOMY/DECOMPRESSION MICRODISCECTOMY Left 01/24/2013   Procedure: LUMBAR FIVE TO SACRAL ONE LUMBAR LAMINECTOMY/DECOMPRESSION MICRODISCECTOMY 1 LEVEL;  Surgeon: Tia Alert, MD;  Location: [facility];  Service: Neurosurgery;  Laterality: Left;   MAXIMUM ACCESS (MAS)POSTERIOR LUMBAR INTERBODY FUSION (PLIF) 1 LEVEL N/A 06/18/2014   Procedure: MAXIMUM ACCESS  SURGERY POSTERIOR LUMBAR INTERBODY FUSION LUMBAR FIVE TO SACRAL ONE ;  Surgeon: Tia Alert, MD;  Location: [facility];  Service: Neurosurgery;  Laterality: N/A;   SPINAL CORD STIMULATOR BATTERY EXCHANGE Right 07/30/2021   Procedure: Spinal cord stimulator battery replacement;  Surgeon: Tia Alert, MD;  Location[facility];  Service: Neurosurgery;  Laterality: Right;   SPINAL CORD STIMULATOR IMPLANT     TOTAL KNEE ARTHROPLASTY Left 08/24/2020   Procedure: TOTAL KNEE ARTHROPLASTY;  Surgeon: Salvatore Marvel, MD;  Location: [facility];  Service: Orthopedics;  Laterality: Left;   Family History  Problem Relation Age of Onset   Hypertension Mother    Stroke Mother    Heart attack Father    Tremor Brother    Social History:  reports that she has never smoked. She has never used smokeless tobacco. She reports that she does not drink alcohol and does not use drugs. Allergies:  Allergies  Allergen Reactions   Amoxicillin Other (See Comments)    Upset stomach   Aspirin     bruising   Azithromycin     stomach upset   Doxycycline Hyclate     GI upset   Hydroxyzine Hcl Other (See Comments)   Buspirone Hcl Palpitations   Mirtazapine Palpitations    Weight gain   Medications Prior to Admission  Medication Sig Dispense Refill   alfuzosin (UROXATRAL) 10 MG 24 hr tablet Take 10 mg by mouth daily with breakfast.     Ascorbic Acid (VITAMIN C PO) Take 1 tablet by mouth every morning.     B Complex Vitamins (VITAMIN B COMPLEX PO) Take 1 capsule by mouth in the morning.     cephALEXin (KEFLEX) 250 MG capsule Take 250 mg by mouth every other day.     cholecalciferol (VITAMIN D3) 25 MCG (1000 UNIT) tablet Take 1,000 Units by mouth every morning.     clonazePAM (KLONOPIN) 0.5 MG tablet Take 1 tablet (0.5 mg total) by mouth at bedtime as needed for anxiety. (Patient taking differently: Take 0.5 mg by mouth at bedtime.) 30 tablet 1   CRANBERRY PO Take 1 tablet by mouth 2 (two) times daily.     dicyclomine  (BENTYL) 10 MG capsule Take 20 mg by mouth 3 (three) times daily before meals.     DULoxetine (CYMBALTA) 60 MG capsule Take 1 capsule (60 mg total) by mouth daily. 90 capsule 3   Erenumab-aooe (AIMOVIG) 70 MG/ML SOAJ Inject 70 mg into the skin every 30 (thirty) days. 1.12 mL 11   ferrous sulfate 325 (65 FE) MG tablet Take 325 mg by mouth daily with breakfast.     fludrocortisone (FLORINEF) 0.1 MG tablet Take 1 tablet daily (Patient taking differently: Take 0.05 mg by mouth daily.) 90 tablet 0   fluticasone (CUTIVATE) 0.05 %  cream Apply 1 application  topically 2 (two) times daily as needed for irritation (Rosacea).     gabapentin (NEURONTIN) 300 MG capsule Take 2-3 tablets at bedtime (Patient taking differently: Take 900 mg by mouth at bedtime.) 90 capsule 5   HORIZANT 600 MG TBCR TAKE ONE TABLET BY MOUTH AT BEDTIME 90 tablet 4   HYDROcodone-acetaminophen (NORCO) 7.5-325 MG tablet Take 1 tablet by mouth every 4 (four) hours as needed for moderate pain ((score 4 to 6)). 40 tablet 0   LINZESS 72 MCG capsule Take 72 mcg by mouth daily as needed (Constipation).     methylPREDNISolone (MEDROL) 4 MG tablet as directed Orally 1 tablet AM and 1/2 tablet afternoon 135 tablet 1   Multiple Vitamin (MULTIVITAMIN) tablet Take 1 tablet by mouth in the morning.     OVER THE COUNTER MEDICATION Take 1 capsule by mouth at bedtime. Sleep 3     Polyethyl Glycol-Propyl Glycol (SYSTANE OP) Place 2 drops into both eyes daily as needed (for dry eyes).     polyethylene glycol (MIRALAX) 17 g packet Take 17 g by mouth 2 (two) times daily. 17 grams in 6 oz of favorite drink twice a day until bowel movement.  LAXITIVE.  Restart if two days since last bowel movement (Patient taking differently: Take 17 g by mouth daily as needed for moderate constipation.) 14 packet 0   Probiotic Product (PROBIOTIC PO) Take 1 capsule by mouth every morning.     promethazine (PHENERGAN) 25 MG tablet Take 25 mg by mouth every 6 (six) hours as  needed for nausea or vomiting.     tiZANidine (ZANAFLEX) 2 MG tablet Take 2 tablets (4 mg total) by mouth every 8 (eight) hours as needed for muscle spasms. (Patient taking differently: Take 4 mg by mouth at bedtime.) 60 tablet 1   acetaminophen (TYLENOL) 500 MG tablet Take 1,000 mg by mouth every 6 (six) hours as needed for moderate pain.     fluticasone (FLONASE) 50 MCG/ACT nasal spray Place 1 spray into both nostrils daily as needed for allergies or rhinitis.     Rimegepant Sulfate (NURTEC) 75 MG TBDP Take 1 tab at onset of migraine.  May repeat in 2 hrs, if needed.  Max dose: 2 tabs/day. This is a 30 day prescription. 10 tablet 5    Home: Home Living Family/patient expects to be discharged to:: Private residence Living Arrangements: Spouse/significant other Available Help at Discharge: Family, Available 24 hours/day Type of Home: House Home Access: Stairs to enter [employer] of Steps: 1 Entrance Stairs-Rails: None Home Layout: One level Bathroom Shower/Tub: Engineer, manufacturing systems: Standard Bathroom Accessibility: Yes Home Equipment: Agricultural consultant (2 wheels), Rollator (4 wheels), Shower seat, Grab bars - toilet, BSC/3in1, Wheelchair - manual Additional Comments: lives at home with husband, has other family support if needed, one step to enter  Functional History: Prior Function Prior Level of Function : Needs assist Mobility Comments: reports 4 falls since last surgery, using RW in the home. significant difficulty with 1 step into house ADLs Comments: reports spouse assists with showers and LB dressing and care. assist wit IADLs Functional Status:  Mobility: Bed Mobility Overal bed mobility: Needs Assistance Bed Mobility: Supine to Sit Rolling: Min assist Sidelying to sit: Min assist Supine to sit: Min assist, HOB elevated General bed mobility comments: minA to maintain log roll and minA to elevate trunk Transfers Overall transfer level: Needs  assistance Equipment used: Rolling walker (2 wheels) Transfers: Sit to/from Stand, Bed to  chair/wheelchair/BSC Sit to Stand: Min guard Bed to/from chair/wheelchair/BSC transfer type:: Step pivot Step pivot transfers: Min guard General transfer comment: min guard, good safety awareness, no LOB but did state she was dizzy upon standing Ambulation/Gait Ambulation/Gait assistance: Min guard, Min assist Gait Distance (Feet): 75 Feet (x2) Assistive device: Rolling walker (2 wheels) Gait Pattern/deviations: Step-to pattern, Step-through pattern, Shuffle, Knee flexed in stance - left, Knee flexed in stance - right General Gait Details: pt with knees flexed and small shuffling steps, minimal step through at times despite cues. no overt buckling in session. minA with head movements during gait Gait velocity: decreased Gait velocity interpretation: <1.31 ft/sec, indicative of household ambulator Stairs: Yes Stairs assistance: Min assist Stair Management: Two rails, Step to pattern, Forwards Number of Stairs: 4 General stair comments: max cues for RLE leading ascending and LLE leading descending. pt with little carryover    ADL: ADL Overall ADL's : Needs assistance/impaired Eating/Feeding: Independent Grooming: Supervision/safety, Standing Upper Body Bathing: Set up, Sitting Lower Body Bathing: Moderate assistance, Sitting/lateral leans Upper Body Dressing : Set up Lower Body Dressing: Moderate assistance, Sit to/from stand Toilet Transfer: Min guard Toileting- Clothing Manipulation and Hygiene: Modified independent Functional mobility during ADLs: Min guard, Rolling walker (2 wheels) General ADL Comments: Pt overall good UB/LB strength, good balance but has frequent dizziness, no LOB during session. Limited with LB ADLs due to back pain and stiffness  Cognition: Cognition Overall Cognitive Status: Within Functional Limits for tasks assessed Orientation Level: Oriented  X4 Cognition Arousal/Alertness: Awake/alert Behavior During Therapy: WFL for tasks assessed/performed Overall Cognitive Status: Within Functional Limits for tasks assessed Area of Impairment: Orientation, Attention, Memory, Following commands, Safety/judgement, Awareness, Problem solving Orientation Level: Disoriented to, Time (patient thinking it is afternoon when early morning) Current Attention Level: Focused (easily distracted) Memory: Decreased recall of precautions, Decreased short-term memory Following Commands: Follows one step commands with increased time, Follows multi-step commands inconsistently Safety/Judgement: Decreased awareness of safety, Decreased awareness of deficits Awareness: Intellectual Problem Solving: Slow processing, Requires verbal cues General Comments: no apparent deficits during eval, able to follow commands consistently, A/O x4, no increased processing time, conversational, good safety awareness  Blood pressure (!) 109/49, pulse 78, temperature 98 F (36.7 C), temperature source Oral, resp. rate 17, height 5' (1.524 m), weight 51.7 kg, SpO2 98%. Physical Exam Vitals and nursing note reviewed. Exam conducted with a chaperone present.  Constitutional:      Comments: Frail patient- short; slightly hunched; sit-stand min A with OT; husband and OT at bedside; Walked to and from bathroom- short shuffling gait- more step to than step over step gait, very talkative, tangential, NAD  HENT:     Head: Normocephalic and atraumatic.     Right Ear: External ear normal.     Left Ear: External ear normal.     Nose: Nose normal. No congestion.     Mouth/Throat:     Mouth: Mucous membranes are dry.     Pharynx: Oropharynx is clear. No oropharyngeal exudate.  Eyes:     General:        Right eye: No discharge.        Left eye: No discharge.  Cardiovascular:     Rate and Rhythm: Normal rate and regular rhythm.     Heart sounds: Normal heart sounds. No murmur heard.     No gallop.  Pulmonary:     Effort: Pulmonary effort is normal. No respiratory distress.     Breath sounds: Normal breath sounds. No wheezing.  Abdominal:     General: There is no distension.     Palpations: Abdomen is soft.     Tenderness: There is no abdominal tenderness.     Comments: hypoactive  Musculoskeletal:     Cervical back: Neck supple. No tenderness.     Comments: LE's- HF 4/5 on R; 4+/5 on L KE/KF 4+/5 B/L DF/PF 4+/5 B/L     Skin:    Comments: L wrist IV- looks OK A lot of ecchymoses in Ue's and LE's   Neurological:     Comments: Vague- fair historian; husband says not at baseline Tangential- hard to keep on topic-bounces around and perseverative on dizziness Due to her position in bedside chair by exam, couldn't test for nystagmus with positon movement Decreased to light touch below Knees B/L Hoffman's B/L in Ue's and clonus in RLE- 3-4 beats likely due to MS  Psychiatric:     Comments: Tangential and perseverative on dizziness A little anxious- flight of ideas     No results found for this or any previous visit (from the past 24 hour(s)). DG Lumbar Spine 2-3 Views  Result Date: 08/17/2022 CLINICAL DATA:  [id number] Elective surgery [id number] EXAM: LUMBAR SPINE - 2-3 VIEW COMPARISON:  July 28, 2022 FINDINGS: Spot fluoroscopy images were obtained for surgical planning purposes. Patient is status post previous posterior fixation with intervertebral spacer placement of L3-5. Spot fluoro images demonstrate removal of posterior rod and placement of screws at the level of L2. Spinal stimulator. Time: 43 seconds Dose: 13.94 mGy Please reference procedure report for further details. IMPRESSION: Spot fluoroscopy images for surgical planning purposes. Electronically Signed   By: Meda Klinefelter M.D.   On: 08/17/2022 18:14   DG C-Arm 1-60 Min-No Report  Result Date: 08/17/2022 Fluoroscopy was utilized by the requesting physician.  No radiographic interpretation.      Assessment/Plan: Diagnosis: L3 fx s/p L2-L4 PLIF and rmeoval of L3-L5 hardware Does the need for close, 24 hr/day medical supervision in concert with the patient's rehab needs make it unreasonable for this patient to be served in a less intensive setting? 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 Yes Does the patient require coordinated care of a physician, rehab nurse, therapy disciplines of PT and OT to address physical and functional deficits in the context of the above medical diagnosis(es)? Yes Addressing deficits in the following areas: balance, endurance, locomotion, strength, transferring, bowel/bladder control, bathing, dressing, feeding, grooming, and toileting Can the patient actively participate in an intensive therapy program of at least 3 hrs of therapy per day at least 5 days per week? Yes The potential for patient to make measurable gains while on inpatient rehab is excellent Anticipated functional outcomes upon discharge from inpatient rehab are modified independent and supervision  with PT, modified independent and supervision with OT, n/a with SLP. Estimated rehab length of stay to reach the above functional goals is: 6-9 days Anticipated discharge destination: Home Overall Rehab/Functional Prognosis: excellent  RECOMMENDATIONS: This patient's condition is appropriate for continued rehabilitative care in the following setting: CIR Patient has agreed to participate in recommended program. Yes Note that insurance prior authorization may be required for  reimbursement for recommended care.  Comment:  1. Pt will need  vertigo evaluation- appears to have BPPV based on history/exam-truly impacting her function and could also be impacting her falls.  2. Cognition decreased compared ot baseline, per pt's husband-  3. I think spasticity playing a role in her falls as well-- because she used to be on Baclofen- she cannot remember why taken off it- but  might benefit from low dose once cognition closer to baseline.  4. Went over with pt and husband that likely will be admitted for ~ 7 days to CIR- to improve level of function to go home safely, however will still need outpt therapies to improve things longer term.  5. Will ask admissions coordinator to apply for insurance to allow admission to CIR.  6. Thank you for consult.    I spent a total of  68   minutes on total care today- >50% coordination of care- due to  Review of chart- involved assessment of pt and d/w husband and pt and OT- and typing up consult.   [name], MD 08/18/2022

## 2022-08-18 NOTE — Plan of Care (Signed)

## 2022-08-18 NOTE — Evaluation (Signed)
Occupational Therapy Evaluation Patient Details Name: Katrina Rivera MRN: [id number] DOB: 12-30-1945 Today's Date: 08/18/2022   History of Present Illness The pt is a 77 yo female presenting 7/10 for fusion of L2-4 and removal of segmental fixation of L3-5 to manage L3 spinal fx sustained in a fall. Pt s/p L3-4 PLIF on 06/22/22. PMH includes: MS, addison's disease, anemia, anxiety, arthritis, chronic back pain, depression, fibromyalgia, hypotension, IBS, peripheral neuropathy, syncope, spinal cord stimulator, and prior back surgery.   Clinical Impression   Pt s/p above diagnosis. Pt doing well, displays good overall strength/functional independence, has some dizziness but no LOB during session, A/O x4, good ability to follow instructions and activity participate, motivated to improve functional strength. Pt PLOF at home with husband who assists in LB ADLs due to back pain/stiffness, has had recent falls due to L knee buckling and dizziness. Pt would benefit from vestibular eval to assess balance. Pt is good candidate for post acute intensive rehab, >3hrs/day to quickly improve functional independence/strength and balance prior to return home, will be seen acutely during stay to improve as able.      Recommendations for follow up therapy are one component of a multi-disciplinary discharge planning process, led by the attending physician.  Recommendations may be updated based on patient status, additional functional criteria and insurance authorization.   Assistance Recommended at Discharge Intermittent Supervision/Assistance  Patient can return home with the following A little help with walking and/or transfers;A little help with bathing/dressing/bathroom;Assistance with cooking/housework;Assist for transportation;Help with stairs or ramp for entrance    Functional Status Assessment  Patient has had a recent decline in their functional status and demonstrates the ability to make significant  improvements in function in a reasonable and predictable amount of time.  Equipment Recommendations  None recommended by OT    Recommendations for Other Services       Precautions / Restrictions Precautions Precautions: Fall;Back Precaution Booklet Issued: No Precaution Comments: recalled 2/3 from prior surgeries, pt with 4 falls since last surgery Required Braces or Orthoses: Spinal Brace Spinal Brace: Applied in sitting position;Lumbar corset Restrictions Weight Bearing Restrictions: No      Mobility Bed Mobility Overal bed mobility: Needs Assistance Bed Mobility: Supine to Sit     Supine to sit: Min assist, HOB elevated     General bed mobility comments: minA to maintain log roll and minA to elevate trunk    Transfers Overall transfer level: Needs assistance Equipment used: Rolling walker (2 wheels) Transfers: Sit to/from Stand, Bed to chair/wheelchair/BSC Sit to Stand: Min guard     Step pivot transfers: Min guard     General transfer comment: min guard, good safety awareness, no LOB but did state she was dizzy upon standing      Balance Overall balance assessment: Needs assistance, History of Falls Sitting-balance support: Feet supported Sitting balance-Leahy Scale: Fair Sitting balance - Comments: able to sit on EOB performing BUE ADLs once settled   Standing balance support: During functional activity Standing balance-Leahy Scale: Fair Standing balance comment: able to stand and perform ADLs at sink unsupported as needed, no LOB                           ADL either performed or assessed with clinical judgement   ADL Overall ADL's : Needs assistance/impaired Eating/Feeding: Independent   Grooming: Supervision/safety;Standing   Upper Body Bathing: Set up;Sitting   Lower Body Bathing: Moderate assistance;Sitting/lateral leans   Upper Body  Dressing : Set up   Lower Body Dressing: Moderate assistance;Sit to/from stand   Toilet  Transfer: Lawyer and Hygiene: Modified independent       Functional mobility during ADLs: Min guard;Rolling walker (2 wheels) General ADL Comments: Pt overall good UB/LB strength, good balance but has frequent dizziness, no LOB during session. Limited with LB ADLs due to back pain and stiffness     Vision Baseline Vision/History: 1 Wears glasses Ability to See in Adequate Light: 0 Adequate Patient Visual Report: No change from baseline       Perception     Praxis      Pertinent Vitals/Pain Pain Assessment Pain Assessment: Faces Faces Pain Scale: Hurts a little bit Pain Location: incision Pain Descriptors / Indicators: Tightness Pain Intervention(s): Monitored during session     Hand Dominance Right   Extremity/Trunk Assessment Upper Extremity Assessment Upper Extremity Assessment: Overall WFL for tasks assessed   Lower Extremity Assessment Lower Extremity Assessment: Defer to PT evaluation       Communication Communication Communication: No difficulties   Cognition Arousal/Alertness: Awake/alert Behavior During Therapy: WFL for tasks assessed/performed Overall Cognitive Status: Within Functional Limits for tasks assessed                                 General Comments: no apparent deficits during eval, able to follow commands consistently, A/O x4, no increased processing time, conversational, good safety awareness     General Comments  Pt reports 4 falls since May due to dizziness and L leg buckling, Pt would benefit from vestibular assessment    Exercises     Shoulder Instructions      Home Living Family/patient expects to be discharged to:: Private residence Living Arrangements: Spouse/significant other Available Help at Discharge: Family;Available 24 hours/day Type of Home: House Home Access: Stairs to enter [employer] of Steps: 1 Entrance Stairs-Rails: None Home Layout: One level      Bathroom Shower/Tub: Chief Strategy Officer: Standard Bathroom Accessibility: Yes How Accessible: Accessible via walker Home Equipment: Rolling Walker (2 wheels);Rollator (4 wheels);Shower seat;Grab bars - toilet;BSC/3in1;Wheelchair - manual   Additional Comments: lives at home with husband, has other family support if needed, one step to enter      Prior Functioning/Environment Prior Level of Function : Needs assist             Mobility Comments: reports 4 falls since last surgery, using RW in the home. significant difficulty with 1 step into house ADLs Comments: reports spouse assists with showers and LB dressing and care. assist wit IADLs        OT Problem List: Decreased range of motion;Decreased activity tolerance;Impaired balance (sitting and/or standing);Pain      OT Treatment/Interventions: Self-care/ADL training;Therapeutic exercise;Energy conservation;Therapeutic activities;Patient/family education    OT Goals(Current goals can be found in the care plan section) Acute Rehab OT Goals Patient Stated Goal: to improve strength/balance and maximize function prior to return home OT Goal Formulation: With patient/family Time For Goal Achievement: 09/01/22 Potential to Achieve Goals: Good  OT Frequency: Min 2X/week    Co-evaluation              AM-PAC OT "6 Clicks" Daily Activity     Outcome Measure Help from another person eating meals?: None Help from another person taking care of personal grooming?: A Little Help from another person toileting, which includes using toliet,  bedpan, or urinal?: A Little Help from another person bathing (including washing, rinsing, drying)?: A Lot Help from another person to put on and taking off regular upper body clothing?: A Little Help from another person to put on and taking off regular lower body clothing?: A Lot 6 Click Score: 17   End of Session Equipment Utilized During Treatment: Gait belt;Rolling walker  (2 wheels) Nurse Communication: Mobility status  Activity Tolerance: Patient tolerated treatment well Patient left: in chair;with call bell/phone within reach;with family/visitor present  OT Visit Diagnosis: Unsteadiness on feet (R26.81);Other abnormalities of gait and mobility (R26.89);Repeated falls (R29.6);Pain Pain - part of body:  (back)                Time: 1610-9604 OT Time Calculation (min): 34 min Charges:  OT General Charges $OT Visit: 1 Visit OT Evaluation $OT Eval Low Complexity: 1 Low OT Treatments $Self Care/Home Management : 8-22 mins  Rolla, OTR/L   Alexis Goodell 08/18/2022, 1:57 PM

## 2022-08-19 ENCOUNTER — Encounter (HOSPITAL_COMMUNITY): Payer: Self-pay

## 2022-08-19 ENCOUNTER — Inpatient Hospital Stay (HOSPITAL_COMMUNITY)
Admission: RE | Admit: 2022-08-19 | Discharge: 2022-08-29 | DRG: 560 | Disposition: A | Discharge status: Home / Self Care | Payer: PPO | Source: Intra-hospital | Attending: Physical Medicine and Rehabilitation | Admitting: Physical Medicine and Rehabilitation

## 2022-08-19 DIAGNOSIS — K589 Irritable bowel syndrome without diarrhea: Secondary | ICD-10-CM | POA: Diagnosis present

## 2022-08-19 DIAGNOSIS — Z8249 Family history of ischemic heart disease and other diseases of the circulatory system: Secondary | ICD-10-CM

## 2022-08-19 DIAGNOSIS — G2581 Restless legs syndrome: Secondary | ICD-10-CM | POA: Diagnosis present

## 2022-08-19 DIAGNOSIS — Z96652 Presence of left artificial knee joint: Secondary | ICD-10-CM | POA: Diagnosis not present

## 2022-08-19 DIAGNOSIS — S32039D Unspecified fracture of third lumbar vertebra, subsequent encounter for fracture with routine healing: Secondary | ICD-10-CM | POA: Diagnosis not present

## 2022-08-19 DIAGNOSIS — E271 Primary adrenocortical insufficiency: Secondary | ICD-10-CM | POA: Diagnosis present

## 2022-08-19 DIAGNOSIS — Z7952 Long term (current) use of systemic steroids: Secondary | ICD-10-CM | POA: Diagnosis not present

## 2022-08-19 DIAGNOSIS — Z79899 Other long term (current) drug therapy: Secondary | ICD-10-CM | POA: Diagnosis not present

## 2022-08-19 DIAGNOSIS — N319 Neuromuscular dysfunction of bladder, unspecified: Secondary | ICD-10-CM | POA: Diagnosis present

## 2022-08-19 DIAGNOSIS — G629 Polyneuropathy, unspecified: Secondary | ICD-10-CM | POA: Diagnosis not present

## 2022-08-19 DIAGNOSIS — Z9682 Presence of neurostimulator: Secondary | ICD-10-CM

## 2022-08-19 DIAGNOSIS — D649 Anemia, unspecified: Secondary | ICD-10-CM | POA: Diagnosis not present

## 2022-08-19 DIAGNOSIS — Z888 Allergy status to other drugs, medicaments and biological substances status: Secondary | ICD-10-CM | POA: Diagnosis not present

## 2022-08-19 DIAGNOSIS — M792 Neuralgia and neuritis, unspecified: Secondary | ICD-10-CM | POA: Diagnosis not present

## 2022-08-19 DIAGNOSIS — S32038D Other fracture of third lumbar vertebra, subsequent encounter for fracture with routine healing: Secondary | ICD-10-CM | POA: Diagnosis not present

## 2022-08-19 DIAGNOSIS — F32A Depression, unspecified: Secondary | ICD-10-CM | POA: Diagnosis not present

## 2022-08-19 DIAGNOSIS — Z880 Allergy status to penicillin: Secondary | ICD-10-CM

## 2022-08-19 DIAGNOSIS — F419 Anxiety disorder, unspecified: Secondary | ICD-10-CM | POA: Diagnosis not present

## 2022-08-19 DIAGNOSIS — G4700 Insomnia, unspecified: Secondary | ICD-10-CM | POA: Diagnosis present

## 2022-08-19 DIAGNOSIS — M797 Fibromyalgia: Secondary | ICD-10-CM | POA: Diagnosis not present

## 2022-08-19 DIAGNOSIS — S32009A Unspecified fracture of unspecified lumbar vertebra, initial encounter for closed fracture: Secondary | ICD-10-CM | POA: Insufficient documentation

## 2022-08-19 DIAGNOSIS — G35 Multiple sclerosis: Secondary | ICD-10-CM | POA: Diagnosis not present

## 2022-08-19 DIAGNOSIS — K582 Mixed irritable bowel syndrome: Secondary | ICD-10-CM | POA: Diagnosis not present

## 2022-08-19 DIAGNOSIS — R339 Retention of urine, unspecified: Secondary | ICD-10-CM | POA: Diagnosis not present

## 2022-08-19 DIAGNOSIS — G43909 Migraine, unspecified, not intractable, without status migrainosus: Secondary | ICD-10-CM | POA: Diagnosis not present

## 2022-08-19 DIAGNOSIS — Z823 Family history of stroke: Secondary | ICD-10-CM | POA: Diagnosis not present

## 2022-08-19 DIAGNOSIS — R269 Unspecified abnormalities of gait and mobility: Secondary | ICD-10-CM | POA: Diagnosis not present

## 2022-08-19 DIAGNOSIS — M542 Cervicalgia: Secondary | ICD-10-CM | POA: Diagnosis present

## 2022-08-19 DIAGNOSIS — S32039A Unspecified fracture of third lumbar vertebra, initial encounter for closed fracture: Secondary | ICD-10-CM | POA: Diagnosis not present

## 2022-08-19 DIAGNOSIS — S32001A Stable burst fracture of unspecified lumbar vertebra, initial encounter for closed fracture: Secondary | ICD-10-CM | POA: Diagnosis present

## 2022-08-19 DIAGNOSIS — Z981 Arthrodesis status: Secondary | ICD-10-CM

## 2022-08-19 DIAGNOSIS — W19XXXD Unspecified fall, subsequent encounter: Secondary | ICD-10-CM | POA: Diagnosis present

## 2022-08-19 DIAGNOSIS — I951 Orthostatic hypotension: Secondary | ICD-10-CM | POA: Diagnosis present

## 2022-08-19 MED ORDER — ALFUZOSIN HCL ER 10 MG PO TB24
10.0000 mg | ORAL_TABLET | Freq: Every day | ORAL | Status: DC
  Administered 2022-08-20 – 2022-08-29 (×10): 10 mg via ORAL
  Filled 2022-08-19 (×10): qty 1

## 2022-08-19 MED ORDER — PROCHLORPERAZINE MALEATE 5 MG PO TABS: 5.0000 mg | ORAL_TABLET | Freq: Four times a day (QID) | ORAL | Status: DC | PRN

## 2022-08-19 MED ORDER — DICYCLOMINE HCL 10 MG PO CAPS
20.0000 mg | ORAL_CAPSULE | Freq: Three times a day (TID) | ORAL | Status: DC
  Administered 2022-08-20 – 2022-08-29 (×27): 20 mg via ORAL
  Filled 2022-08-19 (×31): qty 2

## 2022-08-19 MED ORDER — FLUDROCORTISONE ACETATE 0.1 MG PO TABS
0.0500 mg | ORAL_TABLET | Freq: Every day | ORAL | Status: DC
  Administered 2022-08-20 – 2022-08-29 (×10): 0.05 mg via ORAL
  Filled 2022-08-19: qty 0.5
  Filled 2022-08-19: qty 1
  Filled 2022-08-19 (×2): qty 0.5
  Filled 2022-08-19: qty 1
  Filled 2022-08-19 (×2): qty 0.5
  Filled 2022-08-19 (×2): qty 1
  Filled 2022-08-19: qty 0.5
  Filled 2022-08-19: qty 1

## 2022-08-19 MED ORDER — METHYLPREDNISOLONE 4 MG PO TABS
4.0000 mg | ORAL_TABLET | Freq: Every day | ORAL | Status: DC
  Administered 2022-08-20 – 2022-08-29 (×10): 4 mg via ORAL
  Filled 2022-08-19 (×10): qty 1

## 2022-08-19 MED ORDER — FERROUS SULFATE 325 (65 FE) MG PO TABS
325.0000 mg | ORAL_TABLET | Freq: Every day | ORAL | Status: DC
  Administered 2022-08-20 – 2022-08-29 (×10): 325 mg via ORAL
  Filled 2022-08-19 (×10): qty 1

## 2022-08-19 MED ORDER — HYDROCODONE-ACETAMINOPHEN 7.5-325 MG PO TABS
1.0000 | ORAL_TABLET | ORAL | Status: DC | PRN
  Administered 2022-08-19 – 2022-08-29 (×19): 1 via ORAL
  Filled 2022-08-19 (×19): qty 1

## 2022-08-19 MED ORDER — CEPHALEXIN 250 MG PO CAPS
250.0000 mg | ORAL_CAPSULE | ORAL | Status: DC
  Administered 2022-08-21 – 2022-08-27 (×4): 250 mg via ORAL
  Filled 2022-08-19 (×5): qty 1

## 2022-08-19 MED ORDER — SENNOSIDES-DOCUSATE SODIUM 8.6-50 MG PO TABS
2.0000 | ORAL_TABLET | Freq: Every day | ORAL | Status: DC
  Administered 2022-08-19 – 2022-08-22 (×4): 2 via ORAL
  Filled 2022-08-19 (×4): qty 2

## 2022-08-19 MED ORDER — CLONAZEPAM 0.5 MG PO TABS
0.5000 mg | ORAL_TABLET | Freq: Every day | ORAL | Status: DC
  Administered 2022-08-19 – 2022-08-28 (×10): 0.5 mg via ORAL
  Filled 2022-08-19 (×10): qty 1

## 2022-08-19 MED ORDER — RISAQUAD PO CAPS
1.0000 | ORAL_CAPSULE | Freq: Every day | ORAL | Status: DC
  Administered 2022-08-20 – 2022-08-29 (×10): 1 via ORAL
  Filled 2022-08-19 (×10): qty 1

## 2022-08-19 MED ORDER — PROCHLORPERAZINE 25 MG RE SUPP: 12.5000 mg | Freq: Four times a day (QID) | RECTAL | Status: DC | PRN

## 2022-08-19 MED ORDER — GABAPENTIN 300 MG PO CAPS
900.0000 mg | ORAL_CAPSULE | Freq: Every day | ORAL | Status: DC
  Administered 2022-08-19 – 2022-08-28 (×10): 900 mg via ORAL
  Filled 2022-08-19 (×10): qty 3

## 2022-08-19 MED ORDER — POLYETHYLENE GLYCOL 3350 17 G PO PACK: 17.0000 g | PACK | Freq: Every day | ORAL | Status: DC | PRN

## 2022-08-19 MED ORDER — VITAMIN C 500 MG PO TABS
500.0000 mg | ORAL_TABLET | Freq: Every day | ORAL | Status: DC
  Administered 2022-08-20 – 2022-08-29 (×10): 500 mg via ORAL
  Filled 2022-08-19 (×10): qty 1

## 2022-08-19 MED ORDER — PROCHLORPERAZINE EDISYLATE 10 MG/2ML IJ SOLN: 5.0000 mg | Freq: Four times a day (QID) | INTRAMUSCULAR | Status: DC | PRN

## 2022-08-19 MED ORDER — ALUM & MAG HYDROXIDE-SIMETH 200-200-20 MG/5ML PO SUSP: 30.0000 mL | ORAL | Status: DC | PRN

## 2022-08-19 MED ORDER — TIZANIDINE HCL 4 MG PO TABS
4.0000 mg | ORAL_TABLET | Freq: Three times a day (TID) | ORAL | Status: DC | PRN
  Administered 2022-08-24 – 2022-08-28 (×5): 4 mg via ORAL
  Filled 2022-08-19 (×5): qty 1

## 2022-08-19 MED ORDER — ACETAMINOPHEN 325 MG PO TABS
325.0000 mg | ORAL_TABLET | ORAL | Status: DC | PRN
  Administered 2022-08-22 – 2022-08-28 (×6): 650 mg via ORAL
  Filled 2022-08-19 (×6): qty 2

## 2022-08-19 MED ORDER — FLEET ENEMA 7-19 GM/118ML RE ENEM: 1.0000 | ENEMA | Freq: Once | RECTAL | Status: DC | PRN

## 2022-08-19 MED ORDER — METHYLPREDNISOLONE 4 MG PO TABS
2.0000 mg | ORAL_TABLET | Freq: Every day | ORAL | Status: DC
  Administered 2022-08-19 – 2022-08-28 (×10): 2 mg via ORAL
  Filled 2022-08-19 (×12): qty 1

## 2022-08-19 MED ORDER — SORBITOL 70 % SOLN: 30.0000 mL | Freq: Every day | Status: DC | PRN

## 2022-08-19 MED ORDER — DULOXETINE HCL 60 MG PO CPEP
60.0000 mg | ORAL_CAPSULE | Freq: Every day | ORAL | Status: DC
  Administered 2022-08-20 – 2022-08-29 (×10): 60 mg via ORAL
  Filled 2022-08-19 (×10): qty 1

## 2022-08-19 MED ORDER — VITAMIN D 25 MCG (1000 UNIT) PO TABS
1000.0000 [IU] | ORAL_TABLET | Freq: Every day | ORAL | Status: DC
  Administered 2022-08-20 – 2022-08-29 (×10): 1000 [IU] via ORAL
  Filled 2022-08-19 (×11): qty 1

## 2022-08-19 MED ORDER — B COMPLEX-C PO TABS
1.0000 | ORAL_TABLET | Freq: Every day | ORAL | Status: DC
  Administered 2022-08-20 – 2022-08-29 (×10): 1 via ORAL
  Filled 2022-08-19 (×10): qty 1

## 2022-08-19 MED ORDER — GUAIFENESIN-DM 100-10 MG/5ML PO SYRP: 10.0000 mL | ORAL_SOLUTION | Freq: Four times a day (QID) | ORAL | Status: DC | PRN

## 2022-08-19 MED ORDER — POLYETHYLENE GLYCOL 3350 17 G PO PACK
17.0000 g | PACK | Freq: Every day | ORAL | Status: DC
  Administered 2022-08-20 – 2022-08-21 (×2): 17 g via ORAL
  Filled 2022-08-19 (×4): qty 1

## 2022-08-19 NOTE — Progress Notes (Signed)
Inpatient Rehabilitation Admission Medication Review by a Pharmacist  A complete drug regimen review was completed for this patient to identify any potential clinically significant medication issues.  High Risk Drug Classes Is patient taking? Indication by Medication  Antipsychotic Yes Prochlorperazine for nausea  Anticoagulant No   Antibiotic Yes Cephalexin for UTI prophylaxis   Opioid Yes Hydrocodone for acute pain   Antiplatelet No   Hypoglycemics/insulin No   Vasoactive Medication No   Chemotherapy No   Other Yes Acetaminophen for mild pain Alfuzosin for neurogenic bladder Vitamins C, B complex, D, iron for supplement Clonazepam for sleep Dicyclomine for abdominal cramps Fludrocortisone and methylprednisolone for Addison's disease Gabapentin for nerve pain  Miralax, senna-doc, fleet enema, sorbitol for constipation Tizanidine for muscle spasms Guaifenesin-dextromethorphan for cough  Maalox for indigestion      Type of Medication Issue Identified Description of Issue Recommendation(s)  Drug Interaction(s) (clinically significant)     Duplicate Therapy     Allergy     No Medication Administration End Date     Incorrect Dose     Additional Drug Therapy Needed  Consider enoxaparin for DVT prophylaxis Consider enoxaparin for DVT prophylaxis  Significant med changes from prior encounter (inform family/care partners about these prior to discharge). Held home, horizant, Linzess, rimegepant  Restart as needed   Other       Clinically significant medication issues were identified that warrant physician communication and completion of prescribed/recommended actions by midnight of the next day:  No  Name of provider notified for urgent issues identified:   Provider Method of Notification:     Pharmacist comments:   Time spent performing this drug regimen review (minutes):  25 Alphia Moh, PharmD, BCPS, BCCP Clinical Pharmacist  Please check AMION for all [facility]  phone numbers After 10:00 PM, call Main Pharmacy [phone]

## 2022-08-19 NOTE — H&P (Signed)
Physical Medicine and Rehabilitation Admission H&P     CC: Functional deficits secondary to L3 spinal fracture   HPI: Katrina Rivera is a 77 year old female with a history of relapsing remitting multiple sclerosis who was evaluated by Dr. Yetta Barre regarding gait abnormality, chronic low back pain and status post lumbar fusion May 2024.  She has reportedly fallen 4 times at home after PLIF of L3-5.  She was admitted 08/17/2022 for scheduled surgery of L3 spinal fracture.  She underwent posterior fixation L2-L4 using NuVasive cortical pedicle screws, arthrodesis and removal of segmental fixation L3-L5 by Dr. Yetta Barre.  PT OT evaluations carried out.  Incision is healed without signs of infection.  She is appropriate back soreness.  She has no complaints of leg pain or new numbness, tingling or weakness per Dr. Barnett Applebaum note today.  Signs are stable and she is tolerating regular diet.  Other past medical history includes restless leg syndrome, anxiety, headaches with migraine features, fibromyalgia, hypotension, anemia (acute on chronic).The patient requires inpatient medicine and rehabilitation evaluations and services for ongoing dysfunction secondary to L3 spinal fracture   Review of Systems  Constitutional: Negative.  Negative for fever.  HENT: Negative.    Eyes:  Negative for blurred vision and double vision.  Respiratory: Negative.    Cardiovascular:  Positive for leg swelling. Negative for chest pain.  Gastrointestinal:        Slow transit bowel  Genitourinary:        Retention  Musculoskeletal:  Positive for back pain, falls and joint pain.  Skin:  Negative for rash.  Neurological:  Positive for dizziness, sensory change, focal weakness and weakness.  Psychiatric/Behavioral: Negative.  Negative for depression.         Past Medical History:  Diagnosis Date   Addison's disease (HCC)      takes Solu Cortef daily   Anemia      takes Ferrous Sulfate daily   Anxiety      takes Xanax nightly    Arthritis     Chronic back pain      stenosis   Depression      takes Cymbalta daily   Dizziness      if b/p drops    Fibromyalgia     History of blood transfusion      no abnormal reaction noted   History of bronchitis      many yrs ago    Hypokalemia      takes Potassium daily   Hypotension      takes Florinef daily   IBS (irritable bowel syndrome)      takes Align daily   Insomnia      takes Trazodone nightly   Joint pain     Multiple sclerosis (HCC)      doesn't take any meds   Multiple sclerosis (HCC)     New onset headache 12/22/2021   Nocturia     Osteoporosis     Palpitations     Peripheral neuropathy     Primary localized osteoarthritis of left knee 08/11/2020   Restless leg syndrome     Seasonal allergies      takes Zyrtec daily;uses Flonase daily as needed   Syncope      when missed methylprednisolone dose   Urinary frequency      takes Flomax daily   Weakness      numbness and tingling             Past Surgical History:  Procedure Laterality Date  ABDOMINAL HYSTERECTOMY   02/07/1986   APPENDECTOMY   02/07/1986   CESAREAN SECTION   1973/1977    x2   CHOLECYSTECTOMY   02/08/1995   COLECTOMY   02/08/1988   COLONOSCOPY       ESOPHAGOGASTRODUODENOSCOPY       EYE SURGERY        bilateral - /w IOL- cataracts   FRACTURE SURGERY Right      rods and screws-right leg   LUMBAR LAMINECTOMY/DECOMPRESSION MICRODISCECTOMY Left 01/24/2013    Procedure: LUMBAR FIVE TO SACRAL ONE LUMBAR LAMINECTOMY/DECOMPRESSION MICRODISCECTOMY 1 LEVEL;  Surgeon: Tia Alert, MD;  Location: [facility];  Service: Neurosurgery;  Laterality: Left;   MAXIMUM ACCESS (MAS)POSTERIOR LUMBAR INTERBODY FUSION (PLIF) 1 LEVEL N/A 06/18/2014    Procedure: MAXIMUM ACCESS SURGERY POSTERIOR LUMBAR INTERBODY FUSION LUMBAR FIVE TO SACRAL ONE ;  Surgeon: Tia Alert, MD;  Location: [facility];  Service: Neurosurgery;  Laterality: N/A;   SPINAL CORD STIMULATOR BATTERY EXCHANGE Right  07/30/2021    Procedure: Spinal cord stimulator battery replacement;  Surgeon: Tia Alert, MD;  Location: [facility];  Service: Neurosurgery;  Laterality: Right;   SPINAL CORD STIMULATOR IMPLANT       TOTAL KNEE ARTHROPLASTY Left 08/24/2020    Procedure: TOTAL KNEE ARTHROPLASTY;  Surgeon: Salvatore Marvel, MD;  Location: [facility];  Service: Orthopedics;  Laterality: Left;             Family History  Problem Relation Age of Onset   Hypertension Mother     Stroke Mother     Heart attack Father     Tremor Brother          Social History:  reports that she has never smoked. She has never used smokeless tobacco. She reports that she does not drink alcohol and does not use drugs. Allergies:  Allergies       Allergies  Allergen Reactions   Amoxicillin Other (See Comments)      Upset stomach   Aspirin        bruising   Azithromycin        stomach upset   Doxycycline Hyclate        GI upset   Hydroxyzine Hcl Other (See Comments)   Buspirone Hcl Palpitations   Mirtazapine Palpitations      Weight gain            Medications Prior to Admission  Medication Sig Dispense Refill   alfuzosin (UROXATRAL) 10 MG 24 hr tablet Take 10 mg by mouth daily with breakfast.       Ascorbic Acid (VITAMIN C PO) Take 1 tablet by mouth every morning.       B Complex Vitamins (VITAMIN B COMPLEX PO) Take 1 capsule by mouth in the morning.       cephALEXin (KEFLEX) 250 MG capsule Take 250 mg by mouth every other day.       cholecalciferol (VITAMIN D3) 25 MCG (1000 UNIT) tablet Take 1,000 Units by mouth every morning.       clonazePAM (KLONOPIN) 0.5 MG tablet Take 1 tablet (0.5 mg total) by mouth at bedtime as needed for anxiety. (Patient taking differently: Take 0.5 mg by mouth at bedtime.) 30 tablet 1   CRANBERRY PO Take 1 tablet by mouth 2 (two) times daily.       dicyclomine (BENTYL) 10 MG capsule Take 20 mg by mouth 3 (three) times daily before meals.       DULoxetine (CYMBALTA) 60  MG capsule Take 1  capsule (60 mg total) by mouth daily. 90 capsule 3   Erenumab-aooe (AIMOVIG) 70 MG/ML SOAJ Inject 70 mg into the skin every 30 (thirty) days. 1.12 mL 11   ferrous sulfate 325 (65 FE) MG tablet Take 325 mg by mouth daily with breakfast.       fludrocortisone (FLORINEF) 0.1 MG tablet Take 1 tablet daily (Patient taking differently: Take 0.05 mg by mouth daily.) 90 tablet 0   fluticasone (CUTIVATE) 0.05 % cream Apply 1 application  topically 2 (two) times daily as needed for irritation (Rosacea).       gabapentin (NEURONTIN) 300 MG capsule Take 2-3 tablets at bedtime (Patient taking differently: Take 900 mg by mouth at bedtime.) 90 capsule 5   HORIZANT 600 MG TBCR TAKE ONE TABLET BY MOUTH AT BEDTIME 90 tablet 4   HYDROcodone-acetaminophen (NORCO) 7.5-325 MG tablet Take 1 tablet by mouth every 4 (four) hours as needed for moderate pain ((score 4 to 6)). 40 tablet 0   LINZESS 72 MCG capsule Take 72 mcg by mouth daily as needed (Constipation).       methylPREDNISolone (MEDROL) 4 MG tablet as directed Orally 1 tablet AM and 1/2 tablet afternoon 135 tablet 1   Multiple Vitamin (MULTIVITAMIN) tablet Take 1 tablet by mouth in the morning.       OVER THE COUNTER MEDICATION Take 1 capsule by mouth at bedtime. Sleep 3       Polyethyl Glycol-Propyl Glycol (SYSTANE OP) Place 2 drops into both eyes daily as needed (for dry eyes).       polyethylene glycol (MIRALAX) 17 g packet Take 17 g by mouth 2 (two) times daily. 17 grams in 6 oz of favorite drink twice a day until bowel movement.  LAXITIVE.  Restart if two days since last bowel movement (Patient taking differently: Take 17 g by mouth daily as needed for moderate constipation.) 14 packet 0   Probiotic Product (PROBIOTIC PO) Take 1 capsule by mouth every morning.       promethazine (PHENERGAN) 25 MG tablet Take 25 mg by mouth every 6 (six) hours as needed for nausea or vomiting.       tiZANidine (ZANAFLEX) 2 MG tablet Take 2 tablets (4 mg total) by mouth every 8  (eight) hours as needed for muscle spasms. (Patient taking differently: Take 4 mg by mouth at bedtime.) 60 tablet 1   acetaminophen (TYLENOL) 500 MG tablet Take 1,000 mg by mouth every 6 (six) hours as needed for moderate pain.       fluticasone (FLONASE) 50 MCG/ACT nasal spray Place 1 spray into both nostrils daily as needed for allergies or rhinitis.       Rimegepant Sulfate (NURTEC) 75 MG TBDP Take 1 tab at onset of migraine.  May repeat in 2 hrs, if needed.  Max dose: 2 tabs/day. This is a 30 day prescription. 10 tablet 5              Home: Home Living Family/patient expects to be discharged to:: Private residence Living Arrangements: Spouse/significant other Available Help at Discharge: Family, Available 24 hours/day Type of Home: House Home Access: Stairs to enter [employer] of Steps: 1 Entrance Stairs-Rails: None Home Layout: One level Bathroom Shower/Tub: Engineer, manufacturing systems: Standard Bathroom Accessibility: Yes Home Equipment: Agricultural consultant (2 wheels), Rollator (4 wheels), Shower seat, Grab bars - toilet, BSC/3in1, Wheelchair - manual Additional Comments: lives at home with husband, has other family support if needed, one step  to enter   Functional History: Prior Function Prior Level of Function : Needs assist Mobility Comments: reports 4 falls since last surgery, using RW in the home. significant difficulty with 1 step into house ADLs Comments: reports spouse assists with showers and LB dressing and care. assist wit IADLs   Functional Status:  Mobility: Bed Mobility Overal bed mobility: Needs Assistance Bed Mobility: Rolling, Sidelying to Sit Rolling: Min assist Sidelying to sit: Mod assist Supine to sit: Min assist, HOB elevated General bed mobility comments: minA to maintain log roll and modA to elevate trunk Transfers Overall transfer level: Needs assistance Equipment used: Rolling walker (2 wheels) Transfers: Sit to/from Stand Sit to  Stand: Min assist Bed to/from chair/wheelchair/BSC transfer type:: Step pivot Step pivot transfers: Min guard General transfer comment: vc for hand placement as pt trying to pull on RW to stand with it tipping posteriorly; steadying assist as transitions hands up to RW Ambulation/Gait Ambulation/Gait assistance: Min guard, Min assist Gait Distance (Feet): 120 Feet Assistive device: Rolling walker (2 wheels) Gait Pattern/deviations: Step-to pattern, Step-through pattern, Shuffle, Knee flexed in stance - left, Knee flexed in stance - right, Decreased stride length General Gait Details: pt with knees flexed and small shuffling steps, minimal step through at times despite cues. no overt buckling in session, although pt reports LLE buckling caused most recent 3 falls. Gait velocity: decreased Gait velocity interpretation: <1.8 ft/sec, indicate of risk for recurrent falls Stairs: Yes Stairs assistance: Min assist Stair Management: Two rails, Step to pattern, Forwards Number of Stairs: 4 General stair comments: max cues for RLE leading ascending and LLE leading descending. pt with little carryover   ADL: ADL Overall ADL's : Needs assistance/impaired Eating/Feeding: Independent Grooming: Supervision/safety, Standing Upper Body Bathing: Set up, Sitting Lower Body Bathing: Moderate assistance, Sitting/lateral leans Upper Body Dressing : Set up Lower Body Dressing: Moderate assistance, Sit to/from stand Toilet Transfer: Min guard Toileting- Clothing Manipulation and Hygiene: Modified independent Functional mobility during ADLs: Min guard, Rolling walker (2 wheels) General ADL Comments: Pt overall good UB/LB strength, good balance but has frequent dizziness, no LOB during session. Limited with LB ADLs due to back pain and stiffness   Cognition: Cognition Overall Cognitive Status: Impaired/Different from baseline Orientation Level: Oriented X4 Cognition Arousal/Alertness:  Awake/alert Behavior During Therapy: WFL for tasks assessed/performed Overall Cognitive Status: Impaired/Different from baseline Area of Impairment: Orientation, Attention, Memory, Following commands, Safety/judgement, Awareness, Problem solving Orientation Level: Disoriented to, Time (patient thinking it is afternoon when early morning) Current Attention Level: Sustained (easily distracted) Memory: Decreased recall of precautions, Decreased short-term memory Following Commands: Follows one step commands with increased time, Follows multi-step commands inconsistently Safety/Judgement: Decreased awareness of safety, Decreased awareness of deficits Awareness: Intellectual Problem Solving: Slow processing, Requires verbal cues General Comments: found her walking out of bathroom unassisted on arrival; instructed pt to be patient after pulling the string and MUST be assisted   Physical Exam: Blood pressure (!) 113/53, pulse 75, temperature 98.4 F (36.9 C), temperature source Oral, resp. rate 16, height 5' (1.524 m), weight 51.7 kg, SpO2 95%. Physical Exam Constitutional:      General: She is not in acute distress. HENT:     Head: Normocephalic and atraumatic.     Right Ear: External ear normal.     Left Ear: External ear normal.     Nose: Nose normal.     Mouth/Throat:     Mouth: Mucous membranes are moist.  Eyes:     Extraocular Movements: Extraocular movements  intact.     Conjunctiva/sclera: Conjunctivae normal.     Pupils: Pupils are equal, round, and reactive to light.  Cardiovascular:     Rate and Rhythm: Normal rate and regular rhythm.     Heart sounds: No murmur heard.    No gallop.  Pulmonary:     Effort: Pulmonary effort is normal. No respiratory distress.     Breath sounds: Normal breath sounds. No wheezing.  Abdominal:     General: Bowel sounds are normal. There is no distension.     Palpations: Abdomen is soft.     Tenderness: There is no abdominal tenderness.   Musculoskeletal:     Cervical back: Normal range of motion.     Right lower leg: Edema present.     Left lower leg: Edema present.  Skin:    Findings: Bruising present.     Comments: Diffuse bruising on all 4 limbs, especially distal LE's. Back incision with honeycomb dressing and small area of s/s drainage proximally contained within dressing. Old TKA scar left knee  Neurological:     Mental Status: She is alert.     Comments: Alert and oriented x 3. Normal insight and awareness. Intact Memory. Normal language and speech. Cranial nerve exam unremarkable. MMT: UE 5/5 prox to distal. LE: 3+ HF, 3+ to 4- KE and 4 to 4+/5 ADF/PF. Decreased LT below knees and at fingers in a stocking-glove distribution. DTR's 1+ LE's and UE's.  No abnl resting tone.  Psychiatric:        Mood and Affect: Mood normal.        Behavior: Behavior normal.        Lab Results Last 48 Hours  No results found for this or any previous visit (from the past 48 hour(s)).    Imaging Results (Last 48 hours)  DG Lumbar Spine 2-3 Views   Result Date: 08/17/2022 CLINICAL DATA:  [id number] Elective surgery [id number] EXAM: LUMBAR SPINE - 2-3 VIEW COMPARISON:  July 28, 2022 FINDINGS: Spot fluoroscopy images were obtained for surgical planning purposes. Patient is status post previous posterior fixation with intervertebral spacer placement of L3-5. Spot fluoro images demonstrate removal of posterior rod and placement of screws at the level of L2. Spinal stimulator. Time: 43 seconds Dose: 13.94 mGy Please reference procedure report for further details. IMPRESSION: Spot fluoroscopy images for surgical planning purposes. Electronically Signed   By: Meda Klinefelter M.D.   On: 08/17/2022 18:14    DG C-Arm 1-60 Min-No Report   Result Date: 08/17/2022 Fluoroscopy was utilized by the requesting physician.  No radiographic interpretation.           Blood pressure (!) 113/53, pulse 75, temperature 98.4 F (36.9 C), temperature source  Oral, resp. rate 16, height 5' (1.524 m), weight 51.7 kg, SpO2 95%.   Medical Problem List and Plan: 1. Functional deficits secondary to L3 fx s/p L2-L4 fixation and removal of previous hardware.              -hx also significant for relaxing-remitting MS             -patient may shower             -ELOS/Goals: 7-10 days, mod I to supervision goals with PT and OT             -pt with history of dizziness, LOB. Will ask PT for vestibular eval   2.  Antithrombotics: -DVT/anticoagulation:  Mechanical:  Antiembolism stockings, knee (TED hose) Bilateral lower  extremities>>pt walking 120' with therapies and bruises A LOT. Will stay with TEDS and activity OOB             -antiplatelet therapy: none   3. Pain Management/FMS/peripheral neuropathy/RLS: Tylenol, Norco, Zanaflex as needed             -continue gabapentin 900 mg q HS             -on horizant 600mg  at night also-- Horizant Is non-formulary. Husband to bring in from home. 4. Mood/Behavior/Sleep: LCSW to evaluate and provide emotional support             -antipsychotic agents: n/a             -continue Cymbalta 60 mg daily             -clonazepam 0.5 mg prn anxiety             -hx of migraine HA>>on erenumab injection monthly, Nurtec as needed   5. Neuropsych/cognition: This patient is capable of making decisions on her own behalf.   6. Skin/Wound Care: Routine skin care checks             -observe surgical incision for increased drainage 7. Fluids/Electrolytes/Nutrition: Routine Is and Os and follow-up chemistries             -continue acidophilus, vitamin C, B-complex, D3   8: Hypotension: monitor TID and prn             -continue Florinef   9: Anemia: continue oral iron and follow-up CBC   10: IBS: continue acidophilus and dicyclomine             -Linzess as needed for constipation--pt doesn't like to use because it causes urgent blowouts. Prefers to use miralax                         -will start with senokot-s and miralax  scheduled                         -have miralax prn available also       12. Neurogenic bladder d/t MS             -tends to retain             -check for PVR's             -continue uroxatral             -oob to void             -uses keflex every other day as prophylaxis against UTI's   13. Addison's disease             -will check with NS to see if he's ok resuming medrol             -she takes 4mg  am and 2mg  pm daily at home     Milinda Antis, PA-C 08/19/2022  I have personally performed a face to face diagnostic evaluation of this patient and formulated the key components of the plan.  Additionally, I have personally reviewed laboratory data, imaging studies, as well as relevant notes and concur with the physician assistant's documentation above.  The patient's status has not changed from the original H&P.  Any changes in documentation from the acute care chart have been noted above.  Ranelle Oyster, MD, [place] [facility]

## 2022-08-19 NOTE — Progress Notes (Addendum)
Ranelle Oyster, MD  Physician Physical Medicine and Rehabilitation   PMR Pre-admission    Signed   Date of Service: 08/18/2022  2:23 PM  Related encounter: Admission (Current) from 08/17/2022 in [facility]   Signed     Expand All Collapse All  Show:Clear all [x] Written[x] Templated[x] Copied  Added by: [x] Standley Brooking, RN  [] Hover for details PMR Admission Coordinator Pre-Admission Assessment   Patient: Katrina Rivera is an 77 y.o., female MRN: [id number] DOB: Nov 11, 1945 Height: 5' (152.4 cm) Weight: 51.7 kg                                                                                                                                                  Insurance Information HMO:     PPO: yes     PCP:      IPA:      80/20:      OTHER:  PRIMARY: [employer]      Policy#: [id number]      Subscriber: pt CM Name: Tammy      Phone#: [phone]     Fax#: [phone]/EPIC access Pre-Cert#: [id number] for 10 days 7/12 until 7/22      Employer:  Benefits:  Phone #: [phone]     Name: 7/11 Eff. Date: 02/07/22     Deduct: none      Out of Pocket Max: $3200      Life Max: none  CIR: $295 co pay per day days 1 until 6      SNF: no co pay per day days 1 until 20; $203 co pay per day days 21 until 100 Outpatient: $15 per visit     Co-Pay: visits per medical neccesity Home Health: 100%      Co-Pay: visits per medical neccesity DME: 80%     Co-Pay: 20% Providers: in network  SECONDARY: none         Financial Counselor:       Phone#:    The Data processing manager" for patients in Inpatient Rehabilitation Facilities with attached "Privacy Act Statement-Health Care Records" was provided and verbally reviewed with: Patient and Family   Emergency Contact Information Contact Information   None on File      Other Contacts       Name Relation Home Work Mobile    [place] [phone]   [phone]          Current Medical History   Patient Admitting Diagnosis:L3 Fracture    History of Present Illness:  77 y.o. female With hx of Addison's disease, MS; Fibromyalgia, chronic low back pain; hypotension, depression, neuropathy, spinal cord stimulator;  with recent PLIF L3-L5- 06/22/22- who has fallen at home 4x since then- admitted 08/17/22 for scheduled surgery for L3 spinal fx from 1 of her falls-  She underwent removal of fixation from L3-L5 and had PLIF of L2-L4- with pedical screws due to fx on 7/10. Postoperatively with pain management with no complications.   Patient's medical record from [facility] has been reviewed by the rehabilitation admission coordinator and physician.   Past Medical History      Past Medical History:  Diagnosis Date   Addison's disease (HCC)      takes Solu Cortef daily   Anemia      takes Ferrous Sulfate daily   Anxiety      takes Xanax nightly   Arthritis     Chronic back pain      stenosis   Depression      takes Cymbalta daily   Dizziness      if b/p drops    Fibromyalgia     History of blood transfusion      no abnormal reaction noted   History of bronchitis      many yrs ago    Hypokalemia      takes Potassium daily   Hypotension      takes Florinef daily   IBS (irritable bowel syndrome)      takes Align daily   Insomnia      takes Trazodone nightly   Joint pain     Multiple sclerosis (HCC)      doesn't take any meds   Multiple sclerosis (HCC)     New onset headache 12/22/2021   Nocturia     Osteoporosis     Palpitations     Peripheral neuropathy     Primary localized osteoarthritis of left knee 08/11/2020   Restless leg syndrome     Seasonal allergies      takes Zyrtec daily;uses Flonase daily as needed   Syncope      when missed methylprednisolone dose   Urinary frequency      takes Flomax daily   Weakness      numbness and tingling        Has the patient had major surgery during 100 days prior to  admission? Yes   Family History  family history includes Heart attack in her father; Hypertension in her mother; Stroke in her mother; Tremor in her brother.   Current Medications   Current Medications    Current Facility-Administered Medications:    0.9 %  sodium chloride infusion, 250 mL, Intravenous, Continuous, Arman Bogus, MD, Last Rate: 1 mL/hr at 08/17/22 1847, 250 mL at 08/17/22 1847   0.9 % NaCl with KCl 20 mEq/ L  infusion, , Intravenous, Continuous, Arman Bogus, MD   acetaminophen (TYLENOL) tablet 650 mg, 650 mg, Oral, Q4H PRN, 650 mg at 08/19/22 0702 **OR** acetaminophen (TYLENOL) suppository 650 mg, 650 mg, Rectal, Q4H PRN, Arman Bogus, MD   acidophilus (RISAQUAD) capsule 1 capsule, 1 capsule, Oral, Daily, Arman Bogus, MD, 1 capsule at 08/19/22 0840   alfuzosin (UROXATRAL) 24 hr tablet 10 mg, 10 mg, Oral, Q breakfast, Arman Bogus, MD, 10 mg at 08/19/22 1610   ascorbic acid (VITAMIN C) tablet 500 mg, 500 mg, Oral, Daily, Arman Bogus, MD, 500 mg at 08/19/22 0840   B-complex with vitamin C tablet 1 tablet, 1 tablet, Oral, Daily, Arman Bogus, MD, 1 tablet at 08/19/22 0842   cephALEXin (KEFLEX) capsule 250 mg, 250 mg, Oral, Fonnie Mu, MD, 250 mg at 08/19/22 0843   cholecalciferol (VITAMIN D3) 25 MCG (1000 UNIT) tablet 1,000 Units, 1,000  Units, Oral, Daily, Arman Bogus, MD, 1,000 Units at 08/19/22 0840   clonazePAM (KLONOPIN) tablet 0.5 mg, 0.5 mg, Oral, QHS, Arman Bogus, MD, 0.5 mg at 08/18/22 2115   dicyclomine (BENTYL) capsule 20 mg, 20 mg, Oral, TID Hoy Register, MD, 20 mg at 08/19/22 0844   DULoxetine (CYMBALTA) DR capsule 60 mg, 60 mg, Oral, Daily, Arman Bogus, MD, 60 mg at 08/19/22 0840   ferrous sulfate tablet 325 mg, 325 mg, Oral, Q breakfast, Arman Bogus, MD, 325 mg at 08/19/22 0840   fludrocortisone (FLORINEF) tablet 0.05 mg, 0.05 mg, Oral, Daily, Arman Bogus, MD,  0.05 mg at 08/19/22 0848   gabapentin (NEURONTIN) capsule 900 mg, 900 mg, Oral, QHS, Arman Bogus, MD, 900 mg at 08/18/22 2115   HYDROcodone-acetaminophen (NORCO) 7.5-325 MG per tablet 1 tablet, 1 tablet, Oral, Q4H PRN, Arman Bogus, MD, 1 tablet at 08/19/22 1610   linaclotide (LINZESS) capsule 72 mcg, 72 mcg, Oral, Daily PRN, Arman Bogus, MD   menthol-cetylpyridinium (CEPACOL) lozenge 3 mg, 1 lozenge, Oral, PRN **OR** phenol (CHLORASEPTIC) mouth spray 1 spray, 1 spray, Mouth/Throat, PRN, Arman Bogus, MD   morphine (PF) 2 MG/ML injection 2 mg, 2 mg, Intravenous, Q2H PRN, Arman Bogus, MD, 2 mg at 08/19/22 0401   ondansetron (ZOFRAN) tablet 4 mg, 4 mg, Oral, Q6H PRN **OR** ondansetron (ZOFRAN) injection 4 mg, 4 mg, Intravenous, Q6H PRN, Arman Bogus, MD   polyethylene glycol (MIRALAX / GLYCOLAX) packet 17 g, 17 g, Oral, Daily PRN, Arman Bogus, MD   senna [facility]) tablet 8.6 mg, 1 tablet, Oral, BID, Arman Bogus, MD, 8.6 mg at 08/19/22 0840   sodium chloride flush (NS) 0.9 % injection 3 mL, 3 mL, Intravenous, Q12H, Arman Bogus, MD, 3 mL at 08/19/22 0846   sodium chloride flush (NS) 0.9 % injection 3 mL, 3 mL, Intravenous, PRN, Arman Bogus, MD, 3 mL at 08/17/22 2031   tiZANidine (ZANAFLEX) tablet 4 mg, 4 mg, Oral, Q8H PRN, Arman Bogus, MD, 4 mg at 08/18/22 2115     Patients Current Diet:  Diet Order                  Diet - low sodium heart healthy             Diet regular Room service appropriate? Yes; Fluid consistency: Thin  Diet effective now                       Precautions / Restrictions Precautions Precautions: Fall, Back Precaution Booklet Issued: No Precaution Comments: recalled 2/3 from prior surgeries, pt with 4 falls since last surgery Spinal Brace: Applied in sitting position, Lumbar corset Restrictions Weight Bearing Restrictions: No    Has the patient had 2 or more falls or a fall with injury in  the past year?Yes   Prior Activity Level Limited Community (1-2x/wk): spouse assist with adls   Prior Functional Level Prior Function Prior Level of Function : Needs assist Mobility Comments: reports 4 falls since last surgery, using RW in the home. significant difficulty with 1 step into house ADLs Comments: reports spouse assists with showers and LB dressing and care. assist wit IADLs   Self Care: Did the patient need help bathing, dressing, using the toilet or eating?  Needed some help   Indoor Mobility: Did the patient need assistance with walking from room to room (with or without device)? Needed  some help   Stairs: Did the patient need assistance with internal or external stairs (with or without device)? Needed some help   Functional Cognition: Did the patient need help planning regular tasks such as shopping or remembering to take medications? Independent   Patient Information Are you of Hispanic, Latino/a,or Spanish origin?: A. No, not of Hispanic, Latino/a, or Spanish origin What is your race?: A. White Do you need or want an interpreter to communicate with a doctor or health care staff?: 0. No   Patient's Response To:  Health Literacy and Transportation Is the patient able to respond to health literacy and transportation needs?: Yes Health Literacy - How often do you need to have someone help you when you read instructions, pamphlets, or other written material from your doctor or pharmacy?: Never In the past 12 months, has lack of transportation kept you from medical appointments or from getting medications?: No In the past 12 months, has lack of transportation kept you from meetings, work, or from getting things needed for daily living?: No   Home Assistive Devices / Equipment Home Assistive Devices/Equipment: Eyeglasses, Environmental consultant (specify type), Brace (specify type), Cane (specify quad or straight), Wheelchair, Blood pressure cuff, Grab bars around toilet, Grab bars in  shower, Shower chair with back (back brace) Home Equipment: Agricultural consultant (2 wheels), Rollator (4 wheels), Shower seat, Grab bars - toilet, BSC/3in1, Wheelchair - manual   Prior Device Use: Indicate devices/aids used by the patient prior to current illness, exacerbation or injury? Walker   Current Functional Level Cognition   Overall Cognitive Status: Impaired/Different from baseline Current Attention Level: Sustained (easily distracted) Orientation Level: Oriented X4 Following Commands: Follows one step commands with increased time, Follows multi-step commands inconsistently Safety/Judgement: Decreased awareness of safety, Decreased awareness of deficits General Comments: found her walking out of bathroom unassisted on arrival; instructed pt to be patient after pulling the string and MUST be assisted    Extremity Assessment (includes Sensation/Coordination)   Upper Extremity Assessment: Overall WFL for tasks assessed  Lower Extremity Assessment: Defer to PT evaluation RLE Deficits / Details: grossly 3+/5 at ankle, knee, and hip. pt reports hx of sensation changes due to MS RLE Sensation: decreased light touch LLE Deficits / Details: grossly 3/5 to MMT with pt reporting limited sensation at baseline that has worsened since last surgery LLE Sensation: decreased light touch     ADLs   Overall ADL's : Needs assistance/impaired Eating/Feeding: Independent Grooming: Supervision/safety, Standing Upper Body Bathing: Set up, Sitting Lower Body Bathing: Moderate assistance, Sitting/lateral leans Upper Body Dressing : Set up Lower Body Dressing: Moderate assistance, Sit to/from stand Toilet Transfer: Min guard Toileting- Clothing Manipulation and Hygiene: Modified independent Functional mobility during ADLs: Min guard, Rolling walker (2 wheels) General ADL Comments: Pt overall good UB/LB strength, good balance but has frequent dizziness, no LOB during session. Limited with LB ADLs due to  back pain and stiffness     Mobility   Overal bed mobility: Needs Assistance Bed Mobility: Rolling, Sidelying to Sit Rolling: Min assist Sidelying to sit: Mod assist Supine to sit: Min assist, HOB elevated General bed mobility comments: minA to maintain log roll and modA to elevate trunk     Transfers   Overall transfer level: Needs assistance Equipment used: Rolling walker (2 wheels) Transfers: Sit to/from Stand Sit to Stand: Min assist Bed to/from chair/wheelchair/BSC transfer type:: Step pivot Step pivot transfers: Min guard General transfer comment: vc for hand placement as pt trying to pull on RW to  stand with it tipping posteriorly; steadying assist as transitions hands up to RW     Ambulation / Gait / Stairs / Wheelchair Mobility   Ambulation/Gait Ambulation/Gait assistance: Min guard, Editor, commissioning (Feet): 120 Feet Assistive device: Rolling walker (2 wheels) Gait Pattern/deviations: Step-to pattern, Step-through pattern, Shuffle, Knee flexed in stance - left, Knee flexed in stance - right, Decreased stride length General Gait Details: pt with knees flexed and small shuffling steps, minimal step through at times despite cues. no overt buckling in session, although pt reports LLE buckling caused most recent 3 falls. Gait velocity: decreased Gait velocity interpretation: <1.8 ft/sec, indicate of risk for recurrent falls Stairs: Yes Stairs assistance: Min assist Stair Management: Two rails, Step to pattern, Forwards Number of Stairs: 4 General stair comments: max cues for RLE leading ascending and LLE leading descending. pt with little carryover     Posture / Balance Dynamic Sitting Balance Sitting balance - Comments: able to sit on EOB performing BUE ADLs once settled Balance Overall balance assessment: Needs assistance, History of Falls Sitting-balance support: Feet supported Sitting balance-Leahy Scale: Fair Sitting balance - Comments: able to sit on EOB  performing BUE ADLs once settled Postural control: Posterior lean Standing balance support: During functional activity Standing balance-Leahy Scale: Poor Standing balance comment: after vestibular assessment, required bil UE support Standardized Balance Assessment Standardized Balance Assessment : Dynamic Gait Index Dynamic Gait Index Level Surface: Mild Impairment Change in Gait Speed: Moderate Impairment Gait with Horizontal Head Turns: Moderate Impairment Gait with Vertical Head Turns: Moderate Impairment Gait and Pivot Turn: Moderate Impairment Step Over Obstacle: Moderate Impairment Step Around Obstacles: Moderate Impairment Steps: Moderate Impairment Total Score: 9     Special needs/care consideration Multiple Falls        Previous Home Environment  Living Arrangements: Spouse/significant other Available Help at Discharge: Family, Available 24 hours/day Type of Home: House Home Layout: One level Home Access: Stairs to enter Entrance Stairs-Rails: None Entrance Stairs-Number of Steps: 1 Bathroom Shower/Tub: Associate Professor: Yes How Accessible: Accessible via walker Home Care Services:  (spouse does not recall name of HH) Additional Comments: lives at home with husband, has other family support if needed, one step to enter   Discharge Living Setting Plans for Discharge Living Setting: Patient's home, Lives with (comment) (spouse) Type of Home at Discharge: House Discharge Home Layout: One level Discharge Home Access: Stairs to enter Entrance Stairs-Rails: None Entrance Stairs-Number of Steps: 1 Discharge Bathroom Shower/Tub: Tub/shower unit Discharge Bathroom Toilet: Standard Discharge Bathroom Accessibility: Yes How Accessible: Accessible via walker Does the patient have any problems obtaining your medications?: No   Social/Family/Support Systems Patient Roles: Spouse Contact Information: spouse,  Jomarie Longs Anticipated Caregiver: spouse Anticipated Industrial/product designer Information: see contacts Ability/Limitations of Caregiver: can provide light min assist Caregiver Availability: 24/7 Discharge Plan Discussed with Primary Caregiver: Yes Is Caregiver In Agreement with Plan?: Yes Does Caregiver/Family have Issues with Lodging/Transportation while Pt is in Rehab?: No   Goals Patient/Family Goal for Rehab: Mod I to superivsion with PT, superivsion to min OT Expected length of stay: ELOS 7 t0 10 Pt/Family Agrees to Admission and willing to participate: Yes Program Orientation Provided & Reviewed with Pt/Caregiver Including Roles  & Responsibilities: Yes   Decrease burden of Care through IP rehab admission: n/a   Possible need for SNF placement upon discharge:not anticipated     Patient Condition: This patient's condition remains as documented in the consult dated 08/18/22, in which the Rehabilitation  Physician determined and documented that the patient's condition is appropriate for intensive rehabilitative care in an inpatient rehabilitation facility. Will admit to inpatient rehab today.   Preadmission Screen Completed By:  Clois Dupes, RN MSN 08/19/2022 12:07 PM ______________________________________________________________________   Discussed status with Dr. Riley Kill on 08/19/22 at 1206 and received approval for admission today.   Admission Coordinator:  Clois Dupes RN MSN, time 1610 Date 08/19/22            Revision History

## 2022-08-19 NOTE — Discharge Summary (Signed)
Physician Discharge Summary  Patient ID: Katrina Rivera MRN: [id number] DOB/AGE: 1946-02-02 77 y.o.  Admit date: 08/17/2022 Discharge date: 08/19/2022  Admission Diagnoses: L3 fracture/ MS   Discharge Diagnoses: same   Discharged Condition: stable  Hospital Course: The patient was admitted on 08/17/2022 and taken to the operating room where the patient underwent ORIF L3 fracture. The patient tolerated the procedure well and was taken to the recovery room and then to the floor in stable condition. The hospital course was routine. There were no complications. The wound remained clean dry and intact. Pt had appropriate back soreness. No complaints of leg pain or new N/T/W. The patient remained afebrile with stable vital signs, and tolerated a regular diet. The patient continued to increase activities, and pain was well controlled with oral pain medications.   Consults: rehabilitation medicine  Significant Diagnostic Studies:  Results for orders placed or performed during the hospital encounter of 08/17/22  CBC per protocol  Result Value Ref Range   WBC 7.3 4.0 - 10.5 K/uL   RBC 3.46 (L) 3.87 - 5.11 MIL/uL   Hemoglobin 10.9 (L) 12.0 - 15.0 g/dL   HCT 78.2 (L) 95.6 - 21.3 %   MCV 99.4 80.0 - 100.0 fL   MCH 31.5 26.0 - 34.0 pg   MCHC 31.7 30.0 - 36.0 g/dL   RDW 08.6 57.8 - 46.9 %   Platelets 179 150 - 400 K/uL   nRBC 0.0 0.0 - 0.2 %  Type and screen  Result Value Ref Range   ABO/RH(D) A POS    Antibody Screen NEG    Sample Expiration      08/20/2022,2359 Performed at [facility], [address] [place], [place] [address]     DG Lumbar Spine 2-3 Views  Result Date: 08/17/2022 CLINICAL DATA:  [id number] Elective surgery [id number] EXAM: LUMBAR SPINE - 2-3 VIEW COMPARISON:  July 28, 2022 FINDINGS: Spot fluoroscopy images were obtained for surgical planning purposes. Patient is status post previous posterior fixation with intervertebral spacer placement of L3-5. Spot fluoro images  demonstrate removal of posterior rod and placement of screws at the level of L2. Spinal stimulator. Time: 43 seconds Dose: 13.94 mGy Please reference procedure report for further details. IMPRESSION: Spot fluoroscopy images for surgical planning purposes. Electronically Signed   By: Meda Klinefelter M.D.   On: 08/17/2022 18:14   DG C-Arm 1-60 Min-No Report  Result Date: 08/17/2022 Fluoroscopy was utilized by the requesting physician.  No radiographic interpretation.   MR Lumbar Spine W Wo Contrast  Result Date: 08/07/2022 CLINICAL DATA:  77 year old female with lumbar radiculopathy. Prior surgery. EXAM: MRI LUMBAR SPINE WITHOUT AND WITH CONTRAST TECHNIQUE: Multiplanar and multiecho pulse sequences of the lumbar spine were obtained without and with intravenous contrast. CONTRAST:  5mL GADAVIST GADOBUTROL 1 MMOL/ML IV SOLN COMPARISON:  CT lumbar myelogram 05/24/2022.  Lumbar MRI 10/27/2021. FINDINGS: Segmentation:  Normal, the same numbering system used previously. Alignment:  Stable lumbar lordosis. Vertebrae: Cephalad extension of lumbar decompression and fusion to the L3-L4 level since the April CT. Mild hardware susceptibility artifact. But superimposed mild compression and heterogeneous marrow signal in the posterior L3 superior endplate and superior body. Decreased T1 and T2 signal there with mild if any marrow edema and no abnormal enhancement identified. Elsewhere stable lumbar and lower thoracic vertebral height. Intact visible sacrum and SI joints. Conus medullaris and cauda equina: Conus extends to the L1-L2 level. No lower spinal cord or conus signal abnormality. No abnormal intradural enhancement or  dural thickening. Paraspinal and other soft tissues: Postoperative changes to the lumbar paraspinal soft tissues plus artifact from a right flank generator device. Negative visible abdominal viscera. Disc levels: Visible lower thoracic levels through T12-L1 are stable including subtle central disc  protrusion at T11-T12 on series 5, image 7. No stenosis at those levels. L1-L2: Right paracentral and slightly cephalad disc extrusion (series 8, image 8) is stable. No stenosis. L2-L3: Disc at this level appears stable, normal. Mild facet and ligament flavum hypertrophy since last year. No stenosis. L3-L4: Interval decompression and fusion with resolved spinal stenosis. Mild residual bilateral L3 neural foraminal disc, more pronounced on the left (series 8, image 21). But. No convincing foraminal stenosis L4-L5:  Stable decompression and fusion. L5-S1: Chronically removed S1 pedicle screws. Stable decompression and fusion. IMPRESSION: 1. Interval decompression and fusion of L3-L4 with partial collapse of the L3 posterosuperior endplate. Resolved spinal stenosis at that level, but residual foraminal disc which is greater on the left. Query left L3 radiculitis. 2. Stable L4-L5 and L5-S1 decompression and fusion. 3. No significant adjacent segment disease at L2-L3. 4. Stable small L1-L2 and lower thoracic disc herniations without stenosis. Electronically Signed   By: Odessa Fleming M.D.   On: 08/07/2022 07:36    Antibiotics:  Anti-infectives (From admission, onward)    Start     Dose/Rate Route Frequency Ordered Stop   08/19/22 1000  cephALEXin (KEFLEX) capsule 250 mg        250 mg Oral Every other day 08/17/22 1758     08/17/22 1845  ceFAZolin (ANCEF) IVPB 2g/100 mL premix        2 g 200 mL/hr over 30 Minutes Intravenous Every 8 hours 08/17/22 1758 08/18/22 0100   08/17/22 1045  vancomycin (VANCOCIN) IVPB 1000 mg/200 mL premix        1,000 mg 200 mL/hr over 60 Minutes Intravenous On call to O.R. 08/17/22 1037 08/17/22 1816       Discharge Exam: Blood pressure (!) 113/53, pulse 75, temperature 98.4 F (36.9 C), temperature source Oral, resp. rate 16, height 5' (1.524 m), weight 51.7 kg, SpO2 95%. Neurologic: Grossly normal Dressing dry  Discharge Medications:   Allergies as of 08/19/2022        Reactions   Amoxicillin Other (See Comments)   Upset stomach   Aspirin    bruising   Azithromycin    stomach upset   Doxycycline Hyclate    GI upset   Hydroxyzine Hcl Other (See Comments)   Buspirone Hcl Palpitations   Mirtazapine Palpitations   Weight gain        Medication List     TAKE these medications    acetaminophen 500 MG tablet Commonly known as: TYLENOL Take 1,000 mg by mouth every 6 (six) hours as needed for moderate pain.   Aimovig 70 MG/ML Soaj Generic drug: Erenumab-aooe Inject 70 mg into the skin every 30 (thirty) days.   alfuzosin 10 MG 24 hr tablet Commonly known as: UROXATRAL Take 10 mg by mouth daily with breakfast.   cephALEXin 250 MG capsule Commonly known as: KEFLEX Take 250 mg by mouth every other day.   cholecalciferol 25 MCG (1000 UNIT) tablet Commonly known as: VITAMIN D3 Take 1,000 Units by mouth every morning.   clonazePAM 0.5 MG tablet Commonly known as: KLONOPIN Take 1 tablet (0.5 mg total) by mouth at bedtime as needed for anxiety. What changed: when to take this   CRANBERRY PO Take 1 tablet by mouth 2 (two) times  daily.   dicyclomine 10 MG capsule Commonly known as: BENTYL Take 20 mg by mouth 3 (three) times daily before meals.   DULoxetine 60 MG capsule Commonly known as: CYMBALTA Take 1 capsule (60 mg total) by mouth daily.   ferrous sulfate 325 (65 FE) MG tablet Take 325 mg by mouth daily with breakfast.   fludrocortisone 0.1 MG tablet Commonly known as: FLORINEF Take 1 tablet daily What changed:  how much to take how to take this when to take this additional instructions   fluticasone 0.05 % cream Commonly known as: CUTIVATE Apply 1 application  topically 2 (two) times daily as needed for irritation (Rosacea).   fluticasone 50 MCG/ACT nasal spray Commonly known as: FLONASE Place 1 spray into both nostrils daily as needed for allergies or rhinitis.   gabapentin 300 MG capsule Commonly known as:  NEURONTIN Take 2-3 tablets at bedtime What changed:  how much to take how to take this when to take this additional instructions   Horizant 600 MG Tbcr Generic drug: Gabapentin Enacarbil TAKE ONE TABLET BY MOUTH AT BEDTIME   HYDROcodone-acetaminophen 7.5-325 MG tablet Commonly known as: NORCO Take 1 tablet by mouth every 4 (four) hours as needed for moderate pain ((score 4 to 6)).   Linzess 72 MCG capsule Generic drug: linaclotide Take 72 mcg by mouth daily as needed (Constipation).   methylPREDNISolone 4 MG tablet Commonly known as: MEDROL as directed Orally 1 tablet AM and 1/2 tablet afternoon   multivitamin tablet Take 1 tablet by mouth in the morning.   Nurtec 75 MG Tbdp Generic drug: Rimegepant Sulfate Take 1 tab at onset of migraine.  May repeat in 2 hrs, if needed.  Max dose: 2 tabs/day. This is a 30 day prescription.   OVER THE COUNTER MEDICATION Take 1 capsule by mouth at bedtime. Sleep 3   polyethylene glycol 17 g packet Commonly known as: MiraLax Take 17 g by mouth 2 (two) times daily. 17 grams in 6 oz of favorite drink twice a day until bowel movement.  LAXITIVE.  Restart if two days since last bowel movement What changed:  when to take this reasons to take this additional instructions   PROBIOTIC PO Take 1 capsule by mouth every morning.   promethazine 25 MG tablet Commonly known as: PHENERGAN Take 25 mg by mouth every 6 (six) hours as needed for nausea or vomiting.   SYSTANE OP Place 2 drops into both eyes daily as needed (for dry eyes).   tiZANidine 2 MG tablet Commonly known as: ZANAFLEX Take 2 tablets (4 mg total) by mouth every 8 (eight) hours as needed for muscle spasms. What changed: when to take this   VITAMIN B COMPLEX PO Take 1 capsule by mouth in the morning.   VITAMIN C PO Take 1 tablet by mouth every morning.               Durable Medical Equipment  (From admission, onward)           Start     Ordered   08/17/22  1759  DME Walker rolling  Once       Question:  Patient needs a walker to treat with the following condition  Answer:  S/P lumbar fusion   08/17/22 1758   08/17/22 1759  DME 3 n 1  Once        08/17/22 1758            Disposition: CIR   Final Dx: ORIF L3  fracture  Discharge Instructions     Call MD for:  redness, tenderness, or signs of infection (pain, swelling, redness, odor or green/yellow discharge around incision site)   Complete by: As directed    Call MD for:  severe uncontrolled pain   Complete by: As directed    Call MD for:  temperature >100.4   Complete by: As directed    Diet - low sodium heart healthy   Complete by: As directed    Increase activity slowly   Complete by: As directed         Follow-up Information     Arman Bogus, MD. Schedule an appointment as soon as possible for a visit in 2 week(s).   Specialty: Neurosurgery Contact information: [address] [place] [address] [phone]                  Signed: Tia Alert 08/19/2022, 11:50 AM

## 2022-08-19 NOTE — Progress Notes (Signed)
Patient ID: Katrina Rivera, female   DOB: 11/10/1945, 77 y.o.   MRN: [id number] Subjective: Patient reports back pain/ dizziness  Objective: Vital signs in last 24 hours: Temp:  [98 F (36.7 C)-99.1 F (37.3 C)] 98.4 F (36.9 C) (07/12 0002) Pulse Rate:  [75-90] 75 (07/12 0410) Resp:  [16-18] 16 (07/12 0410) BP: (101-135)/(52-67) 113/53 (07/12 0410) SpO2:  [93 %-96 %] 95 % (07/12 0410)  Intake/Output from previous day: No intake/output data recorded. Intake/Output this shift: No intake/output data recorded.  Neurologic: Grossly normal, dressing dry. Moves legs well  Lab Results: Lab Results  Component Value Date   WBC 7.3 08/17/2022   HGB 10.9 (L) 08/17/2022   HCT 34.4 (L) 08/17/2022   MCV 99.4 08/17/2022   PLT 179 08/17/2022   Lab Results  Component Value Date   INR 0.9 06/09/2022   BMET Lab Results  Component Value Date   NA 138 07/21/2022   K 4.9 07/21/2022   CL 99 07/21/2022   CO2 31 07/21/2022   GLUCOSE 100 (H) 07/21/2022   BUN 20 07/21/2022   CREATININE 0.98 07/21/2022   CALCIUM 10.5 07/21/2022    Studies/Results: DG Lumbar Spine 2-3 Views  Result Date: 08/17/2022 CLINICAL DATA:  [id number] Elective surgery [id number] EXAM: LUMBAR SPINE - 2-3 VIEW COMPARISON:  July 28, 2022 FINDINGS: Spot fluoroscopy images were obtained for surgical planning purposes. Patient is status post previous posterior fixation with intervertebral spacer placement of L3-5. Spot fluoro images demonstrate removal of posterior rod and placement of screws at the level of L2. Spinal stimulator. Time: 43 seconds Dose: 13.94 mGy Please reference procedure report for further details. IMPRESSION: Spot fluoroscopy images for surgical planning purposes. Electronically Signed   By: Meda Klinefelter M.D.   On: 08/17/2022 18:14   DG C-Arm 1-60 Min-No Report  Result Date: 08/17/2022 Fluoroscopy was utilized by the requesting physician.  No radiographic interpretation.    Assessment/Plan: Doing  well PT/OT/rehab for MS flair/ lumbar fusion/ dizziness/ falls  Estimated body mass index is 22.26 kg/m as calculated from the following:   Height as of this encounter: 5' (1.524 m).   Weight as of this encounter: 51.7 kg.    LOS: 2 days    Tia Alert 08/19/2022, 8:10 AM

## 2022-08-19 NOTE — Progress Notes (Signed)
Physical Therapy Treatment Patient Details Name: LAQUETTE CAISSIE MRN: [id number] DOB: [birth year] Today's Date: 08/19/2022   History of Present Illness The pt is a 77 yo female presenting 7/10 for fusion of L2-4 and removal of segmental fixation of L3-5 to manage L3 spinal fx sustained in a fall. Pt s/p L3-4 PLIF on 06/22/22. PMH includes: MS, addison's disease, anemia, anxiety, arthritis, chronic back pain, depression, fibromyalgia, hypotension, IBS, peripheral neuropathy, syncope, spinal cord stimulator, and prior back surgery.    PT Comments  Patient seen for vestibular assessment with apparent central cause to dysequilibrium as pt with direction changing nystagmus with gaze holding left and right (?concussion). All BPPV assessments were negative for nystagmus, although did provoke diplopia. Pt became very symptomatic with slow VOR and head impulse test positive bilaterally. After assessment, proceeded with mobility and pt required increased assist overall compared to 7/11 evaluation--however likely due to vestibular assessment just completed. Instructed pt to work on head turns left to right x 10 reps when lying or sitting (NOT standing) for habituation. Continue to recommend inpatient therapies with >3 hrs therapy/day as pt with multiple falls prior to admission and feel she can benefit prior to return home.     Assistance Recommended at Discharge Frequent or constant Supervision/Assistance  If plan is discharge home, recommend the following:  Can travel by private vehicle    A little help with walking and/or transfers;A lot of help with bathing/dressing/bathroom;Assistance with cooking/housework;Direct supervision/assist for medications management;Direct supervision/assist for financial management;Assist for transportation;Help with stairs or ramp for entrance      Equipment Recommendations  None recommended by PT    Recommendations for Other Services Rehab consult     Precautions /  Restrictions Precautions Precautions: Fall;Back Precaution Booklet Issued: No Precaution Comments: recalled 2/3 from prior surgeries, pt with 4 falls since last surgery Required Braces or Orthoses: Spinal Brace Spinal Brace: Applied in sitting position;Lumbar corset Restrictions Weight Bearing Restrictions: No       08/19/22 1058  Vestibular Assessment  General Observation Supine, smiling  Symptom Behavior  Subjective history of current problem Reports dizziness and nausea began after her first fall--backwards, +hit her head. Describes as persistent while she is moving with rating 5 at rest and 9 during testing (out of 10)  Type of Dizziness  Diplopia;Imbalance;Comment (Things "look large")  Frequency of Dizziness daily  Duration of Dizziness constant, however when increases the severe dizziness is usually minutes  Symptom Nature Constant  Aggravating Factors Activity in general;Moving eyes;Supine to sit  Relieving Factors No known relieving factors  Progression of Symptoms No change since onset  History of similar episodes none  Oculomotor Exam  Oculomotor Alignment Normal  Ocular ROM normal (symptomatic)  Spontaneous Absent  Gaze-induced  Right beating nystagmus with R gaze;Left beating nystagmus with L gaze;Direction changing nystagmus  Smooth Pursuits Saccades  Saccades Dysmetria  Vestibulo-Ocular Reflex  VOR to Slow Head Movement Positive bilaterally  Comment very symptomatic  Positional Testing  Dix-Hallpike Dix-Hallpike Right;Dix-Hallpike Left  Horizontal Canal Testing Horizontal Canal Right;Horizontal Canal Left  Dix-Hallpike Right  Dix-Hallpike Right Duration 0  Dix-Hallpike Right Symptoms No nystagmus  Dix-Hallpike Left  Dix-Hallpike Left Duration 0  Dix-Hallpike Left Symptoms No nystagmus  Horizontal Canal Right  Horizontal Canal Right Duration 0  Horizontal Canal Right Symptoms Normal  Horizontal Canal Left  Horizontal Canal Left Duration 0  Horizontal  Canal Left Symptoms Normal   Mobility  Bed Mobility Overal bed mobility: Needs Assistance Bed Mobility: Rolling, Sidelying to Sit Rolling:  Min assist Sidelying to sit: Mod assist       General bed mobility comments: minA to maintain log roll and modA to elevate trunk    Transfers Overall transfer level: Needs assistance Equipment used: Rolling walker (2 wheels) Transfers: Sit to/from Stand Sit to Stand: Min assist           General transfer comment: vc for hand placement as pt trying to pull on RW to stand with it tipping posteriorly; steadying assist as transitions hands up to RW    Ambulation/Gait Ambulation/Gait assistance: Min guard, Min assist Gait Distance (Feet): 120 Feet Assistive device: Rolling walker (2 wheels) Gait Pattern/deviations: Step-to pattern, Step-through pattern, Shuffle, Knee flexed in stance - left, Knee flexed in stance - right, Decreased stride length Gait velocity: decreased Gait velocity interpretation: <1.8 ft/sec, indicate of risk for recurrent falls   General Gait Details: pt with knees flexed and small shuffling steps, minimal step through at times despite cues. no overt buckling in session, although pt reports LLE buckling caused most recent 3 falls.   Stairs             Wheelchair Mobility     Tilt Bed    Modified Rankin (Stroke Patients Only)       Balance Overall balance assessment: Needs assistance, History of Falls Sitting-balance support: Feet supported Sitting balance-Leahy Scale: Fair Sitting balance - Comments: able to sit on EOB performing BUE ADLs once settled   Standing balance support: During functional activity Standing balance-Leahy Scale: Poor Standing balance comment: after vestibular assessment, required bil UE support                            Cognition Arousal/Alertness: Awake/alert Behavior During Therapy: WFL for tasks assessed/performed Overall Cognitive Status: Impaired/Different  from baseline                     Current Attention Level: Sustained (easily distracted) Memory: Decreased recall of precautions, Decreased short-term memory Following Commands: Follows one step commands with increased time, Follows multi-step commands inconsistently Safety/Judgement: Decreased awareness of safety, Decreased awareness of deficits Awareness: Intellectual Problem Solving: Slow processing, Requires verbal cues General Comments: found her walking out of bathroom unassisted on arrival; instructed pt to be patient after pulling the string and MUST be assisted        Exercises      General Comments        Pertinent Vitals/Pain Pain Assessment Pain Assessment: Faces Faces Pain Scale: Hurts a little bit Pain Location: incision Pain Descriptors / Indicators: Tightness Pain Intervention(s): Limited activity within patient's tolerance, Monitored during session, Repositioned    Home Living                          Prior Function            PT Goals (current goals can now be found in the care plan section) Acute Rehab PT Goals Patient Stated Goal: return home without falling Time For Goal Achievement: 09/01/22 Potential to Achieve Goals: Good Progress towards PT goals: Progressing toward goals    Frequency    Min 5X/week      PT Plan Current plan remains appropriate    Co-evaluation              AM-PAC PT "6 Clicks" Mobility   Outcome Measure  Help needed turning from your back to your side while  in a flat bed without using bedrails?: A Little Help needed moving from lying on your back to sitting on the side of a flat bed without using bedrails?: A Lot Help needed moving to and from a bed to a chair (including a wheelchair)?: A Little Help needed standing up from a chair using your arms (e.g., wheelchair or bedside chair)?: A Little Help needed to walk in hospital room?: A Little Help needed climbing 3-5 steps with a railing? :  A Little 6 Click Score: 17    End of Session Equipment Utilized During Treatment: Gait belt;Back brace Activity Tolerance: Patient tolerated treatment well Patient left: in chair;with call bell/phone within reach   PT Visit Diagnosis: Unsteadiness on feet (R26.81);Repeated falls (R29.6);Muscle weakness (generalized) (M62.81)     Time: 1324-4010 PT Time Calculation (min) (ACUTE ONLY): 46 min  Charges:    $Gait Training: 23-37 mins $Physical Performance Test: 8-22 mins PT General Charges $$ ACUTE PT VISIT: 1 Visit                      Jerolyn Center, PT Acute Rehabilitation Services  Office [phone]    Zena Amos 08/19/2022, 11:12 AM

## 2022-08-19 NOTE — Plan of Care (Signed)
  Problem: Education: Goal: Knowledge of General Education information will improve Description Including pain rating scale, medication(s)/side effects and non-pharmacologic comfort measures Outcome: Completed/Met   Problem: Health Behavior/Discharge Planning: Goal: Ability to manage health-related needs will improve Outcome: Completed/Met   Problem: Clinical Measurements: Goal: Ability to maintain clinical measurements within normal limits will improve Outcome: Completed/Met Goal: Will remain free from infection Outcome: Completed/Met Goal: Diagnostic test results will improve Outcome: Completed/Met Goal: Respiratory complications will improve Outcome: Completed/Met Goal: Cardiovascular complication will be avoided Outcome: Completed/Met   Problem: Activity: Goal: Risk for activity intolerance will decrease Outcome: Completed/Met   Problem: Nutrition: Goal: Adequate nutrition will be maintained Outcome: Completed/Met   Problem: Coping: Goal: Level of anxiety will decrease Outcome: Completed/Met   Problem: Elimination: Goal: Will not experience complications related to bowel motility Outcome: Completed/Met Goal: Will not experience complications related to urinary retention Outcome: Completed/Met   Problem: Pain Managment: Goal: General experience of comfort will improve Outcome: Completed/Met   Problem: Safety: Goal: Ability to remain free from injury will improve Outcome: Completed/Met   Problem: Skin Integrity: Goal: Risk for impaired skin integrity will decrease Outcome: Completed/Met   Problem: Education: Goal: Ability to verbalize activity precautions or restrictions will improve Outcome: Completed/Met Goal: Knowledge of the prescribed therapeutic regimen will improve Outcome: Completed/Met Goal: Understanding of discharge needs will improve Outcome: Completed/Met   Problem: Activity: Goal: Ability to avoid complications of mobility impairment will  improve Outcome: Completed/Met Goal: Ability to tolerate increased activity will improve Outcome: Completed/Met Goal: Will remain free from falls Outcome: Completed/Met   Problem: Bowel/Gastric: Goal: Gastrointestinal status for postoperative course will improve Outcome: Completed/Met   Problem: Clinical Measurements: Goal: Ability to maintain clinical measurements within normal limits will improve Outcome: Completed/Met Goal: Postoperative complications will be avoided or minimized Outcome: Completed/Met Goal: Diagnostic test results will improve Outcome: Completed/Met   Problem: Pain Management: Goal: Pain level will decrease Outcome: Completed/Met   Problem: Skin Integrity: Goal: Will show signs of wound healing Outcome: Completed/Met   Problem: Health Behavior/Discharge Planning: Goal: Identification of resources available to assist in meeting health care needs will improve Outcome: Completed/Met   Problem: Bladder/Genitourinary: Goal: Urinary functional status for postoperative course will improve Outcome: Completed/Met   

## 2022-08-19 NOTE — Progress Notes (Signed)
Inpatient Rehabilitation Admissions Coordinator   I have insurance approval and CIR bed to admit her to today. I met with pt, spouse and sister in law and they are in agreement. I contacted Dr Yetta Barre, Acute team and TOC. I will make the arrangements to admit today.  Ottie Glazier, RN, MSN Rehab Admissions Coordinator [phone] 08/19/2022 12:00 PM

## 2022-08-19 NOTE — H&P (Signed)
Physical Medicine and Rehabilitation Admission H&P   CC: Functional deficits secondary to L3 spinal fracture  HPI: Katrina Rivera is a 77 year old female with a history of relapsing remitting multiple sclerosis who was evaluated by Dr. Yetta Barre regarding gait abnormality, chronic low back pain and status post lumbar fusion May 2024.  She has reportedly fallen 4 times at home after PLIF of L3-5.  She was admitted 08/17/2022 for scheduled surgery of L3 spinal fracture.  She underwent posterior fixation L2-L4 using NuVasive cortical pedicle screws, arthrodesis and removal of segmental fixation L3-L5 by Dr. Yetta Barre.  PT OT evaluations carried out.  Incision is healed without signs of infection.  She is appropriate back soreness.  She has no complaints of leg pain or new numbness, tingling or weakness per Dr. Barnett Applebaum note today.  Signs are stable and she is tolerating regular diet.  Other past medical history includes restless leg syndrome, anxiety, headaches with migraine features, fibromyalgia, hypotension, anemia (acute on chronic).The patient requires inpatient medicine and rehabilitation evaluations and services for ongoing dysfunction secondary to L3 spinal fracture  Review of Systems  Constitutional: Negative.  Negative for fever.  HENT: Negative.    Eyes:  Negative for blurred vision and double vision.  Respiratory: Negative.    Cardiovascular:  Positive for leg swelling. Negative for chest pain.  Gastrointestinal:        Slow transit bowel  Genitourinary:        Retention  Musculoskeletal:  Positive for back pain, falls and joint pain.  Skin:  Negative for rash.  Neurological:  Positive for dizziness, sensory change, focal weakness and weakness.  Psychiatric/Behavioral: Negative.  Negative for depression.    Past Medical History:  Diagnosis Date   Addison's disease (HCC)    takes Solu Cortef daily   Anemia    takes Ferrous Sulfate daily   Anxiety    takes Xanax nightly   Arthritis     Chronic back pain    stenosis   Depression    takes Cymbalta daily   Dizziness    if b/p drops    Fibromyalgia    History of blood transfusion    no abnormal reaction noted   History of bronchitis    many yrs ago    Hypokalemia    takes Potassium daily   Hypotension    takes Florinef daily   IBS (irritable bowel syndrome)    takes Align daily   Insomnia    takes Trazodone nightly   Joint pain    Multiple sclerosis (HCC)    doesn't take any meds   Multiple sclerosis (HCC)    New onset headache 12/22/2021   Nocturia    Osteoporosis    Palpitations    Peripheral neuropathy    Primary localized osteoarthritis of left knee 08/11/2020   Restless leg syndrome    Seasonal allergies    takes Zyrtec daily;uses Flonase daily as needed   Syncope    when missed methylprednisolone dose   Urinary frequency    takes Flomax daily   Weakness    numbness and tingling   Past Surgical History:  Procedure Laterality Date   ABDOMINAL HYSTERECTOMY  02/07/1986   APPENDECTOMY  02/07/1986   CESAREAN SECTION  1973/1977   x2   CHOLECYSTECTOMY  02/08/1995   COLECTOMY  02/08/1988   COLONOSCOPY     ESOPHAGOGASTRODUODENOSCOPY     EYE SURGERY     bilateral - /w IOL- cataracts   FRACTURE SURGERY Right    rods  and screws-right leg   LUMBAR LAMINECTOMY/DECOMPRESSION MICRODISCECTOMY Left 01/24/2013   Procedure: LUMBAR FIVE TO SACRAL ONE LUMBAR LAMINECTOMY/DECOMPRESSION MICRODISCECTOMY 1 LEVEL;  Surgeon: Tia Alert, MD;  Location: [facility];  Service: Neurosurgery;  Laterality: Left;   MAXIMUM ACCESS (MAS)POSTERIOR LUMBAR INTERBODY FUSION (PLIF) 1 LEVEL N/A 06/18/2014   Procedure: MAXIMUM ACCESS SURGERY POSTERIOR LUMBAR INTERBODY FUSION LUMBAR FIVE TO SACRAL ONE ;  Surgeon: Tia Alert, MD;  Location: [facility];  Service: Neurosurgery;  Laterality: N/A;   SPINAL CORD STIMULATOR BATTERY EXCHANGE Right 07/30/2021   Procedure: Spinal cord stimulator battery replacement;  Surgeon: Tia Alert, MD;  Location: [facility];  Service: Neurosurgery;  Laterality: Right;   SPINAL CORD STIMULATOR IMPLANT     TOTAL KNEE ARTHROPLASTY Left 08/24/2020   Procedure: TOTAL KNEE ARTHROPLASTY;  Surgeon: Salvatore Marvel, MD;  Location: [facility];  Service: Orthopedics;  Laterality: Left;   Family History  Problem Relation Age of Onset   Hypertension Mother    Stroke Mother    Heart attack Father    Tremor Brother    Social History:  reports that she has never smoked. She has never used smokeless tobacco. She reports that she does not drink alcohol and does not use drugs. Allergies:  Allergies  Allergen Reactions   Amoxicillin Other (See Comments)    Upset stomach   Aspirin     bruising   Azithromycin     stomach upset   Doxycycline Hyclate     GI upset   Hydroxyzine Hcl Other (See Comments)   Buspirone Hcl Palpitations   Mirtazapine Palpitations    Weight gain   Medications Prior to Admission  Medication Sig Dispense Refill   alfuzosin (UROXATRAL) 10 MG 24 hr tablet Take 10 mg by mouth daily with breakfast.     Ascorbic Acid (VITAMIN C PO) Take 1 tablet by mouth every morning.     B Complex Vitamins (VITAMIN B COMPLEX PO) Take 1 capsule by mouth in the morning.     cephALEXin (KEFLEX) 250 MG capsule Take 250 mg by mouth every other day.     cholecalciferol (VITAMIN D3) 25 MCG (1000 UNIT) tablet Take 1,000 Units by mouth every morning.     clonazePAM (KLONOPIN) 0.5 MG tablet Take 1 tablet (0.5 mg total) by mouth at bedtime as needed for anxiety. (Patient taking differently: Take 0.5 mg by mouth at bedtime.) 30 tablet 1   CRANBERRY PO Take 1 tablet by mouth 2 (two) times daily.     dicyclomine (BENTYL) 10 MG capsule Take 20 mg by mouth 3 (three) times daily before meals.     DULoxetine (CYMBALTA) 60 MG capsule Take 1 capsule (60 mg total) by mouth daily. 90 capsule 3   Erenumab-aooe (AIMOVIG) 70 MG/ML SOAJ Inject 70 mg into the skin every 30 (thirty) days. 1.12 mL 11   ferrous sulfate  325 (65 FE) MG tablet Take 325 mg by mouth daily with breakfast.     fludrocortisone (FLORINEF) 0.1 MG tablet Take 1 tablet daily (Patient taking differently: Take 0.05 mg by mouth daily.) 90 tablet 0   fluticasone (CUTIVATE) 0.05 % cream Apply 1 application  topically 2 (two) times daily as needed for irritation (Rosacea).     gabapentin (NEURONTIN) 300 MG capsule Take 2-3 tablets at bedtime (Patient taking differently: Take 900 mg by mouth at bedtime.) 90 capsule 5   HORIZANT 600 MG TBCR TAKE ONE TABLET BY MOUTH AT BEDTIME 90 tablet 4  HYDROcodone-acetaminophen (NORCO) 7.5-325 MG tablet Take 1 tablet by mouth every 4 (four) hours as needed for moderate pain ((score 4 to 6)). 40 tablet 0   LINZESS 72 MCG capsule Take 72 mcg by mouth daily as needed (Constipation).     methylPREDNISolone (MEDROL) 4 MG tablet as directed Orally 1 tablet AM and 1/2 tablet afternoon 135 tablet 1   Multiple Vitamin (MULTIVITAMIN) tablet Take 1 tablet by mouth in the morning.     OVER THE COUNTER MEDICATION Take 1 capsule by mouth at bedtime. Sleep 3     Polyethyl Glycol-Propyl Glycol (SYSTANE OP) Place 2 drops into both eyes daily as needed (for dry eyes).     polyethylene glycol (MIRALAX) 17 g packet Take 17 g by mouth 2 (two) times daily. 17 grams in 6 oz of favorite drink twice a day until bowel movement.  LAXITIVE.  Restart if two days since last bowel movement (Patient taking differently: Take 17 g by mouth daily as needed for moderate constipation.) 14 packet 0   Probiotic Product (PROBIOTIC PO) Take 1 capsule by mouth every morning.     promethazine (PHENERGAN) 25 MG tablet Take 25 mg by mouth every 6 (six) hours as needed for nausea or vomiting.     tiZANidine (ZANAFLEX) 2 MG tablet Take 2 tablets (4 mg total) by mouth every 8 (eight) hours as needed for muscle spasms. (Patient taking differently: Take 4 mg by mouth at bedtime.) 60 tablet 1   acetaminophen (TYLENOL) 500 MG tablet Take 1,000 mg by mouth every 6  (six) hours as needed for moderate pain.     fluticasone (FLONASE) 50 MCG/ACT nasal spray Place 1 spray into both nostrils daily as needed for allergies or rhinitis.     Rimegepant Sulfate (NURTEC) 75 MG TBDP Take 1 tab at onset of migraine.  May repeat in 2 hrs, if needed.  Max dose: 2 tabs/day. This is a 30 day prescription. 10 tablet 5      Home: Home Living Family/patient expects to be discharged to:: Private residence Living Arrangements: Spouse/significant other Available Help at Discharge: Family, Available 24 hours/day Type of Home: House Home Access: Stairs to enter [employer] of Steps: 1 Entrance Stairs-Rails: None Home Layout: One level Bathroom Shower/Tub: Engineer, manufacturing systems: Standard Bathroom Accessibility: Yes Home Equipment: Agricultural consultant (2 wheels), Rollator (4 wheels), Shower seat, Grab bars - toilet, BSC/3in1, Wheelchair - manual Additional Comments: lives at home with husband, has other family support if needed, one step to enter   Functional History: Prior Function Prior Level of Function : Needs assist Mobility Comments: reports 4 falls since last surgery, using RW in the home. significant difficulty with 1 step into house ADLs Comments: reports spouse assists with showers and LB dressing and care. assist wit IADLs  Functional Status:  Mobility: Bed Mobility Overal bed mobility: Needs Assistance Bed Mobility: Rolling, Sidelying to Sit Rolling: Min assist Sidelying to sit: Mod assist Supine to sit: Min assist, HOB elevated General bed mobility comments: minA to maintain log roll and modA to elevate trunk Transfers Overall transfer level: Needs assistance Equipment used: Rolling walker (2 wheels) Transfers: Sit to/from Stand Sit to Stand: Min assist Bed to/from chair/wheelchair/BSC transfer type:: Step pivot Step pivot transfers: Min guard General transfer comment: vc for hand placement as pt trying to pull on RW to stand with  it tipping posteriorly; steadying assist as transitions hands up to RW Ambulation/Gait Ambulation/Gait assistance: Min guard, Min assist Gait Distance (Feet): 120 Feet  Assistive device: Rolling walker (2 wheels) Gait Pattern/deviations: Step-to pattern, Step-through pattern, Shuffle, Knee flexed in stance - left, Knee flexed in stance - right, Decreased stride length General Gait Details: pt with knees flexed and small shuffling steps, minimal step through at times despite cues. no overt buckling in session, although pt reports LLE buckling caused most recent 3 falls. Gait velocity: decreased Gait velocity interpretation: <1.8 ft/sec, indicate of risk for recurrent falls Stairs: Yes Stairs assistance: Min assist Stair Management: Two rails, Step to pattern, Forwards Number of Stairs: 4 General stair comments: max cues for RLE leading ascending and LLE leading descending. pt with little carryover    ADL: ADL Overall ADL's : Needs assistance/impaired Eating/Feeding: Independent Grooming: Supervision/safety, Standing Upper Body Bathing: Set up, Sitting Lower Body Bathing: Moderate assistance, Sitting/lateral leans Upper Body Dressing : Set up Lower Body Dressing: Moderate assistance, Sit to/from stand Toilet Transfer: Min guard Toileting- Clothing Manipulation and Hygiene: Modified independent Functional mobility during ADLs: Min guard, Rolling walker (2 wheels) General ADL Comments: Pt overall good UB/LB strength, good balance but has frequent dizziness, no LOB during session. Limited with LB ADLs due to back pain and stiffness  Cognition: Cognition Overall Cognitive Status: Impaired/Different from baseline Orientation Level: Oriented X4 Cognition Arousal/Alertness: Awake/alert Behavior During Therapy: WFL for tasks assessed/performed Overall Cognitive Status: Impaired/Different from baseline Area of Impairment: Orientation, Attention, Memory, Following commands, Safety/judgement,  Awareness, Problem solving Orientation Level: Disoriented to, Time (patient thinking it is afternoon when early morning) Current Attention Level: Sustained (easily distracted) Memory: Decreased recall of precautions, Decreased short-term memory Following Commands: Follows one step commands with increased time, Follows multi-step commands inconsistently Safety/Judgement: Decreased awareness of safety, Decreased awareness of deficits Awareness: Intellectual Problem Solving: Slow processing, Requires verbal cues General Comments: found her walking out of bathroom unassisted on arrival; instructed pt to be patient after pulling the string and MUST be assisted  Physical Exam: Blood pressure (!) 113/53, pulse 75, temperature 98.4 F (36.9 C), temperature source Oral, resp. rate 16, height 5' (1.524 m), weight 51.7 kg, SpO2 95%. Physical Exam Constitutional:      General: She is not in acute distress. HENT:     Head: Normocephalic and atraumatic.     Right Ear: External ear normal.     Left Ear: External ear normal.     Nose: Nose normal.     Mouth/Throat:     Mouth: Mucous membranes are moist.  Eyes:     Extraocular Movements: Extraocular movements intact.     Conjunctiva/sclera: Conjunctivae normal.     Pupils: Pupils are equal, round, and reactive to light.  Cardiovascular:     Rate and Rhythm: Normal rate and regular rhythm.     Heart sounds: No murmur heard.    No gallop.  Pulmonary:     Effort: Pulmonary effort is normal. No respiratory distress.     Breath sounds: Normal breath sounds. No wheezing.  Abdominal:     General: Bowel sounds are normal. There is no distension.     Palpations: Abdomen is soft.     Tenderness: There is no abdominal tenderness.  Musculoskeletal:     Cervical back: Normal range of motion.     Right lower leg: Edema present.     Left lower leg: Edema present.  Skin:    Findings: Bruising present.     Comments: Diffuse bruising on all 4 limbs,  especially distal LE's. Back incision with honeycomb dressing and small area of s/s drainage proximally contained  within dressing. Old TKA scar left knee  Neurological:     Mental Status: She is alert.     Comments: Alert and oriented x 3. Normal insight and awareness. Intact Memory. Normal language and speech. Cranial nerve exam unremarkable. MMT: UE 5/5 prox to distal. LE: 3+ HF, 3+ to 4- KE and 4 to 4+/5 ADF/PF. Decreased LT below knees and at fingers in a stocking-glove distribution. DTR's 1+ LE's and UE's.  No abnl resting tone.  Psychiatric:        Mood and Affect: Mood normal.        Behavior: Behavior normal.     No results found for this or any previous visit (from the past 48 hour(s)). DG Lumbar Spine 2-3 Views  Result Date: 08/17/2022 CLINICAL DATA:  [id number] Elective surgery [id number] EXAM: LUMBAR SPINE - 2-3 VIEW COMPARISON:  July 28, 2022 FINDINGS: Spot fluoroscopy images were obtained for surgical planning purposes. Patient is status post previous posterior fixation with intervertebral spacer placement of L3-5. Spot fluoro images demonstrate removal of posterior rod and placement of screws at the level of L2. Spinal stimulator. Time: 43 seconds Dose: 13.94 mGy Please reference procedure report for further details. IMPRESSION: Spot fluoroscopy images for surgical planning purposes. Electronically Signed   By: Meda Klinefelter M.D.   On: 08/17/2022 18:14   DG C-Arm 1-60 Min-No Report  Result Date: 08/17/2022 Fluoroscopy was utilized by the requesting physician.  No radiographic interpretation.      Blood pressure (!) 113/53, pulse 75, temperature 98.4 F (36.9 C), temperature source Oral, resp. rate 16, height 5' (1.524 m), weight 51.7 kg, SpO2 95%.  Medical Problem List and Plan: 1. Functional deficits secondary to L3 fx s/p L2-L4 fixation and removal of previous hardware.   -hx also significant for relaxing-remitting MS  -patient may shower  -ELOS/Goals: 7-10 days, mod I to  supervision goals with PT and OT  -pt with history of dizziness, LOB. Will ask PT for vestibular eval  2.  Antithrombotics: -DVT/anticoagulation:  Mechanical:  Antiembolism stockings, knee (TED hose) Bilateral lower extremities>>pt walking 120' with therapies and bruises A LOT. Will stay with TEDS and activity OOB  -antiplatelet therapy: none  3. Pain Management/FMS/peripheral neuropathy/RLS: Tylenol, Norco, Zanaflex as needed  -continue gabapentin 900 mg q HS  -on horizant 600mg  at night also-- Horizant Is non-formulary. Husband to bring in from home. 4. Mood/Behavior/Sleep: LCSW to evaluate and provide emotional support  -antipsychotic agents: n/a  -continue Cymbalta 60 mg daily  -clonazepam 0.5 mg prn anxiety  -hx of migraine HA>>on erenumab injection monthly, Nurtec as needed  5. Neuropsych/cognition: This patient is capable of making decisions on her own behalf.  6. Skin/Wound Care: Routine skin care checks   -observe surgical incision for increased drainage 7. Fluids/Electrolytes/Nutrition: Routine Is and Os and follow-up chemistries  -continue acidophilus, vitamin C, B-complex, D3  8: Hypotension: monitor TID and prn  -continue Florinef  9: Anemia: continue oral iron and follow-up CBC  10: IBS: continue acidophilus and dicyclomine  -Linzess as needed for constipation--pt doesn't like to use because it causes urgent blowouts. Prefers to use miralax   -will start with senokot-s and miralax scheduled   -have miralax prn available also     12. Neurogenic bladder d/t MS  -tends to retain  -check for PVR's  -continue uroxatral  -oob to void  -uses keflex every other day as prophylaxis against UTI's  13. Addison's disease  -will check with NS to see if he's ok resuming  medrol  -she takes 4mg  am and 2mg  pm daily at home          Milinda Antis, PA-C 08/19/2022

## 2022-08-19 NOTE — Progress Notes (Signed)
Katrina Rouge, MD  Physician Physical Medicine and Rehabilitation   Consult Note    Signed   Date of Service: 08/18/2022  2:05 PM  Related encounter: Admission (Current) from 08/17/2022 in [facility]   Signed     Expand All Collapse All  Show:Clear all [x] Written[x] Templated[] Copied  Added by: [x] Lovorn, Aundra Millet, MD  [] Hover for details          Physical Medicine and Rehabilitation Consult Reason for Consult:CIR Referring Physician: Dr Arman Bogus     HPI: Katrina Rivera is a 77 y.o. female With hx of Addison's disease, MS; Fibromyalgia, chronic low back pain; hypotension, depression, neuropathy, spinal cord stimulator;  with recent PLIF L3-L5- 06/22/22- who has fallen at home 4x since then- admitted 08/17/22 for scheduled surgery for L3 spinal fx from 1 of her falls-  She underwent removal of fixation from L3-L5 and had PLIF of L2-L4- with pedical screws due to fx.    Pt reports she falls backwards when falls- and OT noted she holds onto RW too close forward, which puts her at risk of fall.  She also notes she's used Baclofen in past for spasticity, but doesn't remember when stopped/ or why. Husband notes she is NOT as baseline cognitively, since last fall.    Per PT note, pt having poor carryover- shuffling gait- but walked 75 ft min Assist. However gait less than 1.3 ft per second so very slow- and knees flexed in stance.   Pt reports no BM since surgery, but passing gas.  Having significant dizziness when turns head back and forth- not worse with one side than the other that pt noticed.  Also has dizziness with up/down head movements, but not as bad.        Review of Systems  Constitutional:  Positive for malaise/fatigue. Negative for fever and weight loss.  HENT: Negative.    Eyes: Negative.   Respiratory:  Negative for cough and shortness of breath.   Cardiovascular:  Positive for leg swelling. Negative for  palpitations and orthopnea.  Gastrointestinal:  Positive for constipation. Negative for abdominal pain and nausea.  Genitourinary: Negative.   Musculoskeletal:  Positive for back pain, falls and myalgias.  Skin: Negative.   Neurological:  Positive for dizziness, focal weakness and weakness. Negative for tremors and seizures.  Endo/Heme/Allergies:  Bruises/bleeds easily.  Psychiatric/Behavioral:  Negative for depression and suicidal ideas. The patient is nervous/anxious.   All other systems reviewed and are negative.       Past Medical History:  Diagnosis Date   Addison's disease (HCC)      takes Solu Cortef daily   Anemia      takes Ferrous Sulfate daily   Anxiety      takes Xanax nightly   Arthritis     Chronic back pain      stenosis   Depression      takes Cymbalta daily   Dizziness      if b/p drops    Fibromyalgia     History of blood transfusion      no abnormal reaction noted   History of bronchitis      many yrs ago    Hypokalemia      takes Potassium daily   Hypotension      takes Florinef daily   IBS (irritable bowel syndrome)      takes Align daily   Insomnia      takes Trazodone nightly  Joint pain     Multiple sclerosis (HCC)      doesn't take any meds   Multiple sclerosis (HCC)     New onset headache 12/22/2021   Nocturia     Osteoporosis     Palpitations     Peripheral neuropathy     Primary localized osteoarthritis of left knee 08/11/2020   Restless leg syndrome     Seasonal allergies      takes Zyrtec daily;uses Flonase daily as needed   Syncope      when missed methylprednisolone dose   Urinary frequency      takes Flomax daily   Weakness      numbness and tingling             Past Surgical History:  Procedure Laterality Date   ABDOMINAL HYSTERECTOMY   02/07/1986   APPENDECTOMY   02/07/1986   CESAREAN SECTION   1973/1977    x2   CHOLECYSTECTOMY   02/08/1995   COLECTOMY   02/08/1988   COLONOSCOPY       ESOPHAGOGASTRODUODENOSCOPY        EYE SURGERY        bilateral - /w IOL- cataracts   FRACTURE SURGERY Right      rods and screws-right leg   LUMBAR LAMINECTOMY/DECOMPRESSION MICRODISCECTOMY Left 01/24/2013    Procedure: LUMBAR FIVE TO SACRAL ONE LUMBAR LAMINECTOMY/DECOMPRESSION MICRODISCECTOMY 1 LEVEL;  Surgeon: Tia Alert, MD;  Location: [facility];  Service: Neurosurgery;  Laterality: Left;   MAXIMUM ACCESS (MAS)POSTERIOR LUMBAR INTERBODY FUSION (PLIF) 1 LEVEL N/A 06/18/2014    Procedure: MAXIMUM ACCESS SURGERY POSTERIOR LUMBAR INTERBODY FUSION LUMBAR FIVE TO SACRAL ONE ;  Surgeon: Tia Alert, MD;  Location: [facility];  Service: Neurosurgery;  Laterality: N/A;   SPINAL CORD STIMULATOR BATTERY EXCHANGE Right 07/30/2021    Procedure: Spinal cord stimulator battery replacement;  Surgeon: Tia Alert, MD;  Location: [facility] OR;  Service: Neurosurgery;  Laterality: Right;   SPINAL CORD STIMULATOR IMPLANT       TOTAL KNEE ARTHROPLASTY Left 08/24/2020    Procedure: TOTAL KNEE ARTHROPLASTY;  Surgeon: Salvatore Marvel, MD;  Location: [facility];  Service: Orthopedics;  Laterality: Left;             Family History  Problem Relation Age of Onset   Hypertension Mother     Stroke Mother     Heart attack Father     Tremor Brother          Social History:  reports that she has never smoked. She has never used smokeless tobacco. She reports that she does not drink alcohol and does not use drugs. Allergies:  Allergies       Allergies  Allergen Reactions   Amoxicillin Other (See Comments)      Upset stomach   Aspirin        bruising   Azithromycin        stomach upset   Doxycycline Hyclate        GI upset   Hydroxyzine Hcl Other (See Comments)   Buspirone Hcl Palpitations   Mirtazapine Palpitations      Weight gain            Medications Prior to Admission  Medication Sig Dispense Refill   alfuzosin (UROXATRAL) 10 MG 24 hr tablet Take 10 mg by mouth daily with breakfast.       Ascorbic Acid (VITAMIN C PO)  Take 1 tablet by mouth every morning.  B Complex Vitamins (VITAMIN B COMPLEX PO) Take 1 capsule by mouth in the morning.       cephALEXin (KEFLEX) 250 MG capsule Take 250 mg by mouth every other day.       cholecalciferol (VITAMIN D3) 25 MCG (1000 UNIT) tablet Take 1,000 Units by mouth every morning.       clonazePAM (KLONOPIN) 0.5 MG tablet Take 1 tablet (0.5 mg total) by mouth at bedtime as needed for anxiety. (Patient taking differently: Take 0.5 mg by mouth at bedtime.) 30 tablet 1   CRANBERRY PO Take 1 tablet by mouth 2 (two) times daily.       dicyclomine (BENTYL) 10 MG capsule Take 20 mg by mouth 3 (three) times daily before meals.       DULoxetine (CYMBALTA) 60 MG capsule Take 1 capsule (60 mg total) by mouth daily. 90 capsule 3   Erenumab-aooe (AIMOVIG) 70 MG/ML SOAJ Inject 70 mg into the skin every 30 (thirty) days. 1.12 mL 11   ferrous sulfate 325 (65 FE) MG tablet Take 325 mg by mouth daily with breakfast.       fludrocortisone (FLORINEF) 0.1 MG tablet Take 1 tablet daily (Patient taking differently: Take 0.05 mg by mouth daily.) 90 tablet 0   fluticasone (CUTIVATE) 0.05 % cream Apply 1 application  topically 2 (two) times daily as needed for irritation (Rosacea).       gabapentin (NEURONTIN) 300 MG capsule Take 2-3 tablets at bedtime (Patient taking differently: Take 900 mg by mouth at bedtime.) 90 capsule 5   HORIZANT 600 MG TBCR TAKE ONE TABLET BY MOUTH AT BEDTIME 90 tablet 4   HYDROcodone-acetaminophen (NORCO) 7.5-325 MG tablet Take 1 tablet by mouth every 4 (four) hours as needed for moderate pain ((score 4 to 6)). 40 tablet 0   LINZESS 72 MCG capsule Take 72 mcg by mouth daily as needed (Constipation).       methylPREDNISolone (MEDROL) 4 MG tablet as directed Orally 1 tablet AM and 1/2 tablet afternoon 135 tablet 1   Multiple Vitamin (MULTIVITAMIN) tablet Take 1 tablet by mouth in the morning.       OVER THE COUNTER MEDICATION Take 1 capsule by mouth at bedtime. Sleep 3        Polyethyl Glycol-Propyl Glycol (SYSTANE OP) Place 2 drops into both eyes daily as needed (for dry eyes).       polyethylene glycol (MIRALAX) 17 g packet Take 17 g by mouth 2 (two) times daily. 17 grams in 6 oz of favorite drink twice a day until bowel movement.  LAXITIVE.  Restart if two days since last bowel movement (Patient taking differently: Take 17 g by mouth daily as needed for moderate constipation.) 14 packet 0   Probiotic Product (PROBIOTIC PO) Take 1 capsule by mouth every morning.       promethazine (PHENERGAN) 25 MG tablet Take 25 mg by mouth every 6 (six) hours as needed for nausea or vomiting.       tiZANidine (ZANAFLEX) 2 MG tablet Take 2 tablets (4 mg total) by mouth every 8 (eight) hours as needed for muscle spasms. (Patient taking differently: Take 4 mg by mouth at bedtime.) 60 tablet 1   acetaminophen (TYLENOL) 500 MG tablet Take 1,000 mg by mouth every 6 (six) hours as needed for moderate pain.       fluticasone (FLONASE) 50 MCG/ACT nasal spray Place 1 spray into both nostrils daily as needed for allergies or rhinitis.  Rimegepant Sulfate (NURTEC) 75 MG TBDP Take 1 tab at onset of migraine.  May repeat in 2 hrs, if needed.  Max dose: 2 tabs/day. This is a 30 day prescription. 10 tablet 5          Home: Home Living Family/patient expects to be discharged to:: Private residence Living Arrangements: Spouse/significant other Available Help at Discharge: Family, Available 24 hours/day Type of Home: House Home Access: Stairs to enter [employer] of Steps: 1 Entrance Stairs-Rails: None Home Layout: One level Bathroom Shower/Tub: Engineer, manufacturing systems: Standard Bathroom Accessibility: Yes Home Equipment: Agricultural consultant (2 wheels), Rollator (4 wheels), Shower seat, Grab bars - toilet, BSC/3in1, Wheelchair - manual Additional Comments: lives at home with husband, has other family support if needed, one step to enter  Functional History: Prior  Function Prior Level of Function : Needs assist Mobility Comments: reports 4 falls since last surgery, using RW in the home. significant difficulty with 1 step into house ADLs Comments: reports spouse assists with showers and LB dressing and care. assist wit IADLs Functional Status:  Mobility: Bed Mobility Overal bed mobility: Needs Assistance Bed Mobility: Supine to Sit Rolling: Min assist Sidelying to sit: Min assist Supine to sit: Min assist, HOB elevated General bed mobility comments: minA to maintain log roll and minA to elevate trunk Transfers Overall transfer level: Needs assistance Equipment used: Rolling walker (2 wheels) Transfers: Sit to/from Stand, Bed to chair/wheelchair/BSC Sit to Stand: Min guard Bed to/from chair/wheelchair/BSC transfer type:: Step pivot Step pivot transfers: Min guard General transfer comment: min guard, good safety awareness, no LOB but did state she was dizzy upon standing Ambulation/Gait Ambulation/Gait assistance: Min guard, Min assist Gait Distance (Feet): 75 Feet (x2) Assistive device: Rolling walker (2 wheels) Gait Pattern/deviations: Step-to pattern, Step-through pattern, Shuffle, Knee flexed in stance - left, Knee flexed in stance - right General Gait Details: pt with knees flexed and small shuffling steps, minimal step through at times despite cues. no overt buckling in session. minA with head movements during gait Gait velocity: decreased Gait velocity interpretation: <1.31 ft/sec, indicative of household ambulator Stairs: Yes Stairs assistance: Min assist Stair Management: Two rails, Step to pattern, Forwards Number of Stairs: 4 General stair comments: max cues for RLE leading ascending and LLE leading descending. pt with little carryover   ADL: ADL Overall ADL's : Needs assistance/impaired Eating/Feeding: Independent Grooming: Supervision/safety, Standing Upper Body Bathing: Set up, Sitting Lower Body Bathing: Moderate  assistance, Sitting/lateral leans Upper Body Dressing : Set up Lower Body Dressing: Moderate assistance, Sit to/from stand Toilet Transfer: Min guard Toileting- Clothing Manipulation and Hygiene: Modified independent Functional mobility during ADLs: Min guard, Rolling walker (2 wheels) General ADL Comments: Pt overall good UB/LB strength, good balance but has frequent dizziness, no LOB during session. Limited with LB ADLs due to back pain and stiffness   Cognition: Cognition Overall Cognitive Status: Within Functional Limits for tasks assessed Orientation Level: Oriented X4 Cognition Arousal/Alertness: Awake/alert Behavior During Therapy: WFL for tasks assessed/performed Overall Cognitive Status: Within Functional Limits for tasks assessed Area of Impairment: Orientation, Attention, Memory, Following commands, Safety/judgement, Awareness, Problem solving Orientation Level: Disoriented to, Time (patient thinking it is afternoon when early morning) Current Attention Level: Focused (easily distracted) Memory: Decreased recall of precautions, Decreased short-term memory Following Commands: Follows one step commands with increased time, Follows multi-step commands inconsistently Safety/Judgement: Decreased awareness of safety, Decreased awareness of deficits Awareness: Intellectual Problem Solving: Slow processing, Requires verbal cues General Comments: no apparent deficits during eval, able  to follow commands consistently, A/O x4, no increased processing time, conversational, good safety awareness   Blood pressure (!) 109/49, pulse 78, temperature 98 F (36.7 C), temperature source Oral, resp. rate 17, height 5' (1.524 m), weight 51.7 kg, SpO2 98%. Physical Exam Vitals and nursing note reviewed. Exam conducted with a chaperone present.  Constitutional:      Comments: Frail patient- short; slightly hunched; sit-stand min A with OT; husband and OT at bedside; Walked to and from bathroom-  short shuffling gait- more step to than step over step gait, very talkative, tangential, NAD  HENT:     Head: Normocephalic and atraumatic.     Right Ear: External ear normal.     Left Ear: External ear normal.     Nose: Nose normal. No congestion.     Mouth/Throat:     Mouth: Mucous membranes are dry.     Pharynx: Oropharynx is clear. No oropharyngeal exudate.  Eyes:     General:        Right eye: No discharge.        Left eye: No discharge.  Cardiovascular:     Rate and Rhythm: Normal rate and regular rhythm.     Heart sounds: Normal heart sounds. No murmur heard.    No gallop.  Pulmonary:     Effort: Pulmonary effort is normal. No respiratory distress.     Breath sounds: Normal breath sounds. No wheezing.  Abdominal:     General: There is no distension.     Palpations: Abdomen is soft.     Tenderness: There is no abdominal tenderness.     Comments: hypoactive  Musculoskeletal:     Cervical back: Neck supple. No tenderness.     Comments: LE's- HF 4/5 on R; 4+/5 on L KE/KF 4+/5 B/L DF/PF 4+/5 B/L      Skin:    Comments: L wrist IV- looks OK A lot of ecchymoses in Ue's and LE's   Neurological:     Comments: Vague- fair historian; husband says not at baseline Tangential- hard to keep on topic-bounces around and perseverative on dizziness Due to her position in bedside chair by exam, couldn't test for nystagmus with positon movement Decreased to light touch below Knees B/L Hoffman's B/L in Ue's and clonus in RLE- 3-4 beats likely due to MS  Psychiatric:     Comments: Tangential and perseverative on dizziness A little anxious- flight of ideas        Lab Results Last 24 Hours  No results found for this or any previous visit (from the past 24 hour(s)).    Imaging Results (Last 48 hours)  DG Lumbar Spine 2-3 Views   Result Date: 08/17/2022 CLINICAL DATA:  [id number] Elective surgery [id number] EXAM: LUMBAR SPINE - 2-3 VIEW COMPARISON:  July 28, 2022 FINDINGS: Spot fluoroscopy  images were obtained for surgical planning purposes. Patient is status post previous posterior fixation with intervertebral spacer placement of L3-5. Spot fluoro images demonstrate removal of posterior rod and placement of screws at the level of L2. Spinal stimulator. Time: 43 seconds Dose: 13.94 mGy Please reference procedure report for further details. IMPRESSION: Spot fluoroscopy images for surgical planning purposes. Electronically Signed   By: Meda Klinefelter M.D.   On: 08/17/2022 18:14    DG C-Arm 1-60 Min-No Report   Result Date: 08/17/2022 Fluoroscopy was utilized by the requesting physician.  No radiographic interpretation.         Assessment/Plan: Diagnosis: L3 fx s/p L2-L4 PLIF and rmeoval  of L3-L5 hardware Does the need for close, 24 hr/day medical supervision in concert with the patient's rehab needs make it unreasonable for this patient to be served in a less intensive setting? 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 Yes Does the patient require coordinated care of a physician, rehab nurse, therapy disciplines of PT and OT to address physical and functional deficits in the context of the above medical diagnosis(es)? Yes Addressing deficits in the following areas: balance, endurance, locomotion, strength, transferring, bowel/bladder control, bathing, dressing, feeding, grooming, and toileting Can the patient actively participate in an intensive therapy program of at least 3 hrs of therapy per day at least 5 days per week? Yes The potential for patient to make  measurable gains while on inpatient rehab is excellent Anticipated functional outcomes upon discharge from inpatient rehab are modified independent and supervision  with PT, modified independent and supervision with OT, n/a with SLP. Estimated rehab length of stay to reach the above functional goals is: 6-9 days Anticipated discharge destination: Home Overall Rehab/Functional Prognosis: excellent   RECOMMENDATIONS: This patient's condition is appropriate for continued rehabilitative care in the following setting: CIR Patient has agreed to participate in recommended program. Yes Note that insurance prior authorization may be required for reimbursement for recommended care.   Comment:  1. Pt will need vertigo evaluation- appears to have BPPV based on history/exam-truly impacting her function and could also be impacting her falls.  2. Cognition decreased compared ot baseline, per pt's husband-  3. I think spasticity playing a role in her falls as well-- because she used to be on Baclofen- she cannot remember why taken off it- but  might benefit from low dose once cognition closer to baseline.  4. Went over with pt and husband that likely will be admitted for ~ 7 days to CIR- to improve level of function to go home safely, however will still need outpt therapies to improve things longer term.  5. Will ask admissions coordinator to apply for insurance to allow admission to CIR.  6. Thank you for consult.      I spent a total of  68   minutes on total care today- >50% coordination of care- due to  Review of chart- involved assessment of pt and d/w husband and pt and OT- and typing up consult.    Katrina Rouge, MD 08/18/2022          Routing History

## 2022-08-20 DIAGNOSIS — K582 Mixed irritable bowel syndrome: Secondary | ICD-10-CM

## 2022-08-20 DIAGNOSIS — S32039D Unspecified fracture of third lumbar vertebra, subsequent encounter for fracture with routine healing: Principal | ICD-10-CM

## 2022-08-20 DIAGNOSIS — N319 Neuromuscular dysfunction of bladder, unspecified: Secondary | ICD-10-CM

## 2022-08-20 DIAGNOSIS — G35 Multiple sclerosis: Secondary | ICD-10-CM | POA: Diagnosis not present

## 2022-08-20 DIAGNOSIS — M792 Neuralgia and neuritis, unspecified: Secondary | ICD-10-CM

## 2022-08-20 NOTE — Evaluation (Signed)
Physical Therapy Assessment and Plan  Patient Details  Name: Katrina Rivera MRN: [id number] Date of Birth: 11/26/1945  PT Diagnosis: Abnormal posture, Abnormality of gait, and Muscle weakness Rehab Potential: Good ELOS: 7-10 days   Today's Date: 08/20/2022 PT Individual Time: 1001 - 1115      Hospital Problem: Principal Problem:   Lumbar vertebral fracture (HCC)   Past Medical History:  Past Medical History:  Diagnosis Date   Addison's disease (HCC)    takes Solu Cortef daily   Anemia    takes Ferrous Sulfate daily   Anxiety    takes Xanax nightly   Arthritis    Chronic back pain    stenosis   Depression    takes Cymbalta daily   Dizziness    if b/p drops    Fibromyalgia    History of blood transfusion    no abnormal reaction noted   History of bronchitis    many yrs ago    Hypokalemia    takes Potassium daily   Hypotension    takes Florinef daily   IBS (irritable bowel syndrome)    takes Align daily   Insomnia    takes Trazodone nightly   Joint pain    Multiple sclerosis (HCC)    doesn't take any meds   Multiple sclerosis (HCC)    New onset headache 12/22/2021   Nocturia    Osteoporosis    Palpitations    Peripheral neuropathy    Primary localized osteoarthritis of left knee 08/11/2020   Restless leg syndrome    Seasonal allergies    takes Zyrtec daily;uses Flonase daily as needed   Syncope    when missed methylprednisolone dose   Urinary frequency    takes Flomax daily   Weakness    numbness and tingling   Past Surgical History:  Past Surgical History:  Procedure Laterality Date   ABDOMINAL HYSTERECTOMY  02/07/1986   APPENDECTOMY  02/07/1986   CESAREAN SECTION  1973/1977   x2   CHOLECYSTECTOMY  02/08/1995   COLECTOMY  02/08/1988   COLONOSCOPY     ESOPHAGOGASTRODUODENOSCOPY     EYE SURGERY     bilateral - /w IOL- cataracts   FRACTURE SURGERY Right    rods and screws-right leg   LUMBAR LAMINECTOMY/DECOMPRESSION MICRODISCECTOMY Left  01/24/2013   Procedure: LUMBAR FIVE TO SACRAL ONE LUMBAR LAMINECTOMY/DECOMPRESSION MICRODISCECTOMY 1 LEVEL;  Surgeon: Tia Alert, MD;  Location: [facility];  Service: Neurosurgery;  Laterality: Left;   MAXIMUM ACCESS (MAS)POSTERIOR LUMBAR INTERBODY FUSION (PLIF) 1 LEVEL N/A 06/18/2014   Procedure: MAXIMUM ACCESS SURGERY POSTERIOR LUMBAR INTERBODY FUSION LUMBAR FIVE TO SACRAL ONE ;  Surgeon: Tia Alert, MD;  Location: [facility];  Service: Neurosurgery;  Laterality: N/A;   SPINAL CORD STIMULATOR BATTERY EXCHANGE Right 07/30/2021   Procedure: Spinal cord stimulator battery replacement;  Surgeon: Tia Alert, MD;  Location: [facility];  Service: Neurosurgery;  Laterality: Right;   SPINAL CORD STIMULATOR IMPLANT     TOTAL KNEE ARTHROPLASTY Left 08/24/2020   Procedure: TOTAL KNEE ARTHROPLASTY;  Surgeon: Salvatore Marvel, MD;  Location: [facility];  Service: Orthopedics;  Laterality: Left;    Assessment & Plan Clinical Impression: Katrina Rivera is a 77 year old female with a history of relapsing remitting multiple sclerosis who was evaluated by Dr. Yetta Barre regarding gait abnormality, chronic low back pain and status post lumbar fusion May 2024. She has reportedly fallen 4 times at home after PLIF of L3-5. She was admitted 08/17/2022 for scheduled  surgery of L3 spinal fracture. She underwent posterior fixation L2-L4 using NuVasive cortical pedicle screws, arthrodesis and removal of segmental fixation L3-L5 by Dr. Yetta Barre. PT OT evaluations carried out. Incision is healed without signs of infection. She is appropriate back soreness. She has no complaints of leg pain or new numbness, tingling or weakness per Dr. Barnett Applebaum note today. Signs are stable and she is tolerating regular diet. Other past medical history includes restless leg syndrome, anxiety, headaches with migraine features, fibromyalgia, hypotension, anemia (acute on chronic).The patient requires inpatient medicine and rehabilitation evaluations and  services for ongoing dysfunction secondary to L3 spinal fracture    Patient currently requires min with mobility secondary to muscle weakness and decreased coordination.  Prior to hospitalization, patient was modified independent  with mobility and lived with Spouse in a House home.  Home access is 1 from carport, 3 frontStairs to enter.  Patient will benefit from skilled PT intervention to maximize safe functional mobility, minimize fall risk, and decrease caregiver burden for planned discharge home with 24 hour supervision.  Anticipate patient will benefit from follow up HH at discharge.  PT - End of Session Activity Tolerance: Tolerates 30+ min activity with multiple rests Endurance Deficit: Yes PT Assessment Rehab Potential (ACUTE/IP ONLY): Good PT Barriers to Discharge: Inaccessible home environment PT Barriers to Discharge Comments: STE home w/o rails. PT Patient demonstrates impairments in the following area(s): Balance;Sensory;Endurance;Motor;Pain PT Transfers Functional Problem(s): Bed Mobility;Bed to Chair;Car;Furniture PT Locomotion Functional Problem(s): Ambulation;Wheelchair Mobility;Stairs PT Plan PT Intensity: Minimum of 1-2 x/day ,45 to 90 minutes PT Frequency: 5 out of 7 days PT Duration Estimated Length of Stay: 7-10 days PT Treatment/Interventions: Ambulation/gait training;Community reintegration;Neuromuscular re-education;Stair training;UE/LE Strength taining/ROM;Wheelchair propulsion/positioning;UE/LE Coordination activities;Therapeutic Activities;Pain management;Discharge planning;Balance/vestibular training;Functional mobility training;Patient/family education;Therapeutic Exercise PT Transfers Anticipated Outcome(s): mod I PT Locomotion Anticipated Outcome(s): supervision w/ LRAD PT Recommendation Follow Up Recommendations: Home health PT Patient destination: Home Equipment Recommended: To be determined Equipment Details: pt probably already has all needed  equipment.   PT Evaluation Precautions/Restrictions Precautions Precautions: Fall;Back Precaution Booklet Issued: No Precaution Comments: recalled 2/3 from prior surgeries, pt with 4 falls since last surgery Required Braces or Orthoses: Spinal Brace Spinal Brace: Applied in sitting position;Lumbar corset Restrictions Weight Bearing Restrictions: No General Chart Reviewed: Yes Family/Caregiver Present: No Vital SignsTherapy Vitals Temp: 98.2 F (36.8 C) Temp Source: Oral Pulse Rate: 92 Resp: 18 BP: 126/71 Patient Position (if appropriate): Sitting Oxygen Therapy SpO2: 95 % O2 Device: Room Air Pain Pain Assessment Pain Scale: 0-10 (Simultaneous filing. User may not have seen previous data.) Pain Score: 5  (Simultaneous filing. User may not have seen previous data.) Pain Type: Surgical pain Pain Location: Back (Simultaneous filing. User may not have seen previous data.) Pain Orientation: Lower Pain Descriptors / Indicators: Aching Pain Onset: On-going Patients Stated Pain Goal: 2 Pain Intervention(s): Pain med given for lower pain score than stated, per patient request;Repositioned Multiple Pain Sites: No Pain Interference Pain Interference Pain Effect on Sleep: 3. Frequently Pain Interference with Therapy Activities: 1. Rarely or not at all Pain Interference with Day-to-Day Activities: 1. Rarely or not at all Home Living/Prior Functioning Home Living Available Help at Discharge: Family;Available 24 hours/day Type of Home: House Home Access: Stairs to enter [employer] of Steps: 1 from carport, 3 front Entrance Stairs-Rails: None Home Layout: One level Bathroom Shower/Tub: Nurse, adult Accessibility: Yes Additional Comments: lives at home with husband, has other family support if needed, one step to enter  Lives With: Spouse Prior  Function Level of Independence: Independent with transfers;Independent with gait Bath: Minimal Dressing:  Minimal  Able to Take Stairs?: Yes Driving: No Vocation: Retired Optometrist - History Ability to See in Adequate Light: 0 Adequate Vision - Assessment Additional Comments: vestibular testing and tx ongoing Perception Perception: Within Functional Limits Praxis Praxis: Intact  Cognition Overall Cognitive Status: Within Functional Limits for tasks assessed Arousal/Alertness: Awake/alert Memory: Impaired Memory Impairment: Decreased recall of new information Awareness: Appears intact Problem Solving: Appears intact Safety/Judgment: Appears intact Sensation Sensation Light Touch: Impaired by gross assessment (c/o numbness/tingling B LES especially knees and distal.) Hot/Cold: Appears Intact Proprioception: Appears Intact Stereognosis: Appears Intact Coordination Gross Motor Movements are Fluid and Coordinated: No Fine Motor Movements are Fluid and Coordinated: No Coordination and Movement Description: retropulsion, positerior lean Finger Nose Finger Test: slowed B ly Heel Shin Test: decreased speed, L> R. 9 Hole Peg Test: 28 sec R, 32 sec L mildly slowed for age and gender Bly Motor  Motor Motor: Ataxia;Abnormal postural alignment and control Motor - Skilled Clinical Observations: cues for forward weight shift, + posterior lean tendedncies, cues to weight shift forward   Trunk/Postural Assessment  Cervical Assessment Cervical Assessment: Within Functional Limits Thoracic Assessment Thoracic Assessment: Exceptions to [facility] (rounded shoulders) Lumbar Assessment Lumbar Assessment: Exceptions to [facility] (posterior pelvic tilt.) Postural Control Postural Control: Deficits on evaluation Righting Reactions: delayed Protective Responses: delayed Postural Limitations: posterior pelvis and trunk lean  Balance Balance Balance Assessed: Yes Static Sitting Balance Static Sitting - Balance Support: Feet supported Static Sitting - Level of Assistance: 5: Stand by  assistance Dynamic Sitting Balance Dynamic Sitting - Balance Support: Feet supported Dynamic Sitting - Level of Assistance: 5: Stand by assistance Static Standing Balance Static Standing - Balance Support: During functional activity Static Standing - Level of Assistance: 4: Min assist Dynamic Standing Balance Dynamic Standing - Balance Support: During functional activity;Bilateral upper extremity supported Dynamic Standing - Level of Assistance: 3: Mod assist Extremity Assessment  RUE Assessment RUE Assessment: Exceptions to [facility] General Strength Comments: grossly 4-/5 LUE Assessment LUE Assessment: Exceptions to [facility] General Strength Comments: grossly 4-/5 RLE Assessment RLE Assessment: Within Functional Limits General Strength Comments: grossly 4/5, hip flexion not formally tested 2/2 pain LB but at least 3+/5 LLE Assessment LLE Assessment: Within Functional Limits General Strength Comments: grossly 4/5, hip flexion not formally tested 2/2 pain LB but at least 3+/5  Care Tool Care Tool Bed Mobility Roll left and right activity   Roll left and right assist level: Contact Guard/Touching assist    Sit to lying activity   Sit to lying assist level: Contact Guard/Touching assist    Lying to sitting on side of bed activity   Lying to sitting on side of bed assist level: the ability to move from lying on the back to sitting on the side of the bed with no back support.: Contact Guard/Touching assist     Care Tool Transfers Sit to stand transfer   Sit to stand assist level: Minimal Assistance - Patient > 75%    Chair/bed transfer   Chair/bed transfer assist level: Minimal Assistance - Patient > 75%     Toilet transfer   Assist Level: Minimal Assistance - Patient > 75%    Car transfer          Care Tool Locomotion Ambulation          Walk 10 feet activity         Walk 50 feet with 2 turns activity  Walk 150 feet activity        Walk 10 feet on uneven  surfaces activity        Stairs          Walk up/down 1 step activity        Walk up/down 4 steps activity        Walk up/down 12 steps activity        Pick up small objects from floor        Wheelchair            Wheel 50 feet with 2 turns activity      Wheel 150 feet activity        Refer to Care Plan for Long Term Goals  SHORT TERM GOAL WEEK 1 PT Short Term Goal 1 (Week 1): STG=LTG 2/2 ELOS.  Recommendations for other services: None   Skilled Therapeutic Intervention Evaluation completed (see details above and below) with education on PT POC and goals and individual treatment initiated with focus on  endurance, gait, transfers, strengthening.  Pt presents sitting in w/c and agreeable to therapy.  Pt wheeled to main gym w/ supervision B UES x 150'.  Pt transfers sit to stand w/ min A mostly 2/2 cueing for hand placement, pt tends to push on RW, but self-corrects x 30% of time.  Pt amb up to 150' w/ min A and verbal cues for improved foot clearance.  Pt negotiated 4 steps w/ 2 rails ascending w/ min A and cueing for sequencing.  Pt descended w/ B hands on left rail and min A.  PT asked if possibility of railing being constructed, but pt skeptical (joking "Are you going to pay for it?")  Pt states usually uses 1 large step from carport.  Pt performed simulated sedan height car transfer to mat table w/ min A.  Pt returned to room and performed sit <> supine transfers w/ CGA via log roll technique.  Pt performed SPT w/ RW to w/c w/ min A.  Chair alarm on and all needs in reach.    Mobility Bed Mobility Bed Mobility: Right Sidelying to Sit;Sit to Sidelying Right Rolling Right: Contact Guard/Touching assist Rolling Left: Contact Guard/Touching assist Right Sidelying to Sit: Minimal Assistance - Patient > 75% Sit to Sidelying Right: Contact Guard/Touching assist Transfers Transfers: Sit to Stand;Stand to Sit;Stand Pivot Transfers Sit to Stand: Minimal Assistance -  Patient > 75% Stand to Sit: Minimal Assistance - Patient > 75% Stand Pivot Transfers: Minimal Assistance - Patient > 75% Stand Pivot Transfer Details: Verbal cues for technique;Verbal cues for safe use of DME/AE Transfer (Assistive device): Rolling walker Locomotion  Gait Ambulation: Yes Gait Assistance: Minimal Assistance - Patient > 75% Gait Distance (Feet): 150 Feet Assistive device: Rolling walker Gait Assistance Details: Verbal cues for safe use of DME/AE Gait Gait: Yes Gait Pattern: Poor foot clearance - left Gait velocity: decreased Stairs / Additional Locomotion Stairs: Yes Stairs Assistance: Minimal Assistance - Patient > 75% Stair Management Technique: Two rails Number of Stairs: 4 Height of Stairs: 6 Wheelchair Mobility Wheelchair Mobility: Yes Wheelchair Assistance: Doctor, general practice: Both upper extremities Wheelchair Parts Management: Needs assistance Distance: 150   Discharge Criteria: Patient will be discharged from PT if patient refuses treatment 3 consecutive times without medical reason, if treatment goals not met, if there is a change in medical status, if patient makes no progress towards goals or if patient is discharged from hospital.  The above assessment, treatment plan, treatment  alternatives and goals were discussed and mutually agreed upon: by patient  Lucio Edward 08/20/2022, 4:16 PM

## 2022-08-20 NOTE — Progress Notes (Signed)
Agree with assigned nurse initial assessment, patient resting comfortable with no complaints at this time

## 2022-08-20 NOTE — Evaluation (Signed)
Occupational Therapy Assessment and Plan  Patient Details  Name: Katrina Rivera MRN: [id number] Date of Birth: 18-Mar-1945  OT Diagnosis: abnormal posture, acute pain, ataxia, lumbago (low back pain), and muscle weakness (generalized) Rehab Potential: Rehab Potential (ACUTE ONLY): Good ELOS: 7-10 d   Today's Date: 08/20/2022 OT Individual Time: 1335-1435 1st Session; 1335-1435 2nd Session  OT Individual Time Calculation (min): 60 min, 60 min   Hospital Problem: Principal Problem:   Lumbar vertebral fracture (HCC)   Past Medical History:  Past Medical History:  Diagnosis Date   Addison's disease (HCC)    takes Solu Cortef daily   Anemia    takes Ferrous Sulfate daily   Anxiety    takes Xanax nightly   Arthritis    Chronic back pain    stenosis   Depression    takes Cymbalta daily   Dizziness    if b/p drops    Fibromyalgia    History of blood transfusion    no abnormal reaction noted   History of bronchitis    many yrs ago    Hypokalemia    takes Potassium daily   Hypotension    takes Florinef daily   IBS (irritable bowel syndrome)    takes Align daily   Insomnia    takes Trazodone nightly   Joint pain    Multiple sclerosis (HCC)    doesn't take any meds   Multiple sclerosis (HCC)    New onset headache 12/22/2021   Nocturia    Osteoporosis    Palpitations    Peripheral neuropathy    Primary localized osteoarthritis of left knee 08/11/2020   Restless leg syndrome    Seasonal allergies    takes Zyrtec daily;uses Flonase daily as needed   Syncope    when missed methylprednisolone dose   Urinary frequency    takes Flomax daily   Weakness    numbness and tingling   Past Surgical History:  Past Surgical History:  Procedure Laterality Date   ABDOMINAL HYSTERECTOMY  02/07/1986   APPENDECTOMY  02/07/1986   CESAREAN SECTION  1973/1977   x2   CHOLECYSTECTOMY  02/08/1995   COLECTOMY  02/08/1988   COLONOSCOPY     ESOPHAGOGASTRODUODENOSCOPY     EYE  SURGERY     bilateral - /w IOL- cataracts   FRACTURE SURGERY Right    rods and screws-right leg   LUMBAR LAMINECTOMY/DECOMPRESSION MICRODISCECTOMY Left 01/24/2013   Procedure: LUMBAR FIVE TO SACRAL ONE LUMBAR LAMINECTOMY/DECOMPRESSION MICRODISCECTOMY 1 LEVEL;  Surgeon: Tia Alert, MD;  Location: [facility];  Service: Neurosurgery;  Laterality: Left;   MAXIMUM ACCESS (MAS)POSTERIOR LUMBAR INTERBODY FUSION (PLIF) 1 LEVEL N/A 06/18/2014   Procedure: MAXIMUM ACCESS SURGERY POSTERIOR LUMBAR INTERBODY FUSION LUMBAR FIVE TO SACRAL ONE ;  Surgeon: Tia Alert, MD;  Location: [facility];  Service: Neurosurgery;  Laterality: N/A;   SPINAL CORD STIMULATOR BATTERY EXCHANGE Right 07/30/2021   Procedure: Spinal cord stimulator battery replacement;  Surgeon: Tia Alert, MD;  Location: [facility] OR;  Service: Neurosurgery;  Laterality: Right;   SPINAL CORD STIMULATOR IMPLANT     TOTAL KNEE ARTHROPLASTY Left 08/24/2020   Procedure: TOTAL KNEE ARTHROPLASTY;  Surgeon: Salvatore Marvel, MD;  Location: [facility];  Service: Orthopedics;  Laterality: Left;    Assessment & Plan Clinical Impression: Katrina Rivera is a 77 year old female with a history of relapsing remitting multiple sclerosis who was evaluated by Dr. Yetta Barre regarding gait abnormality, chronic low back pain and status post lumbar fusion May  2024. She has reportedly fallen 4 times at home after PLIF of L3-5. She was admitted 08/17/2022 for scheduled surgery of L3 spinal fracture. She underwent posterior fixation L2-L4 using NuVasive cortical pedicle screws, arthrodesis and removal of segmental fixation L3-L5 by Dr. Yetta Barre. PT OT evaluations carried out. Incision is healed without signs of infection. She is appropriate back soreness. She has no complaints of leg pain or new numbness, tingling or weakness per Dr. Barnett Applebaum note today. Signs are stable and she is tolerating regular diet. Other past medical history includes restless leg syndrome, anxiety, headaches  with migraine features, fibromyalgia, hypotension, anemia (acute on chronic).The patient requires inpatient medicine and rehabilitation evaluations and services for ongoing dysfunction secondary to L3 spinal fracture   Patient currently requires mod with basic self-care skills and IADL secondary to muscle weakness and muscle joint tightness, decreased cardiorespiratoy endurance, unbalanced muscle activation, ataxia, decreased coordination, and decreased motor planning, central origin, and decreased sitting balance, decreased standing balance, decreased postural control, decreased balance strategies, and difficulty maintaining precautions.  Prior to hospitalization, patient could complete BADL's and amb with AD  with supervision but was experiencing multiple falls post last surgery in May.   Patient will benefit from skilled intervention to decrease level of assist with basic self-care skills, increase independence with basic self-care skills, and increase level of independence with iADL prior to discharge home with care partner.  Anticipate patient will require 24 hour supervision and minimal physical assistance and follow up home health versus follow up outpatient TBD.  OT - End of Session Activity Tolerance: Decreased this session Endurance Deficit: Yes OT Assessment Rehab Potential (ACUTE ONLY): Good OT Barriers to Discharge: Inaccessible home environment;Incontinence OT Barriers to Discharge Comments: step to enter home, tub shower with TTB needs OT Patient demonstrates impairments in the following area(s): Balance;Endurance;Motor;Safety;Skin Integrity;Sensory;Pain OT Basic ADL's Functional Problem(s): Grooming;Toileting;Bathing;Dressing OT Advanced ADL's Functional Problem(s): Simple Meal Preparation;Light Housekeeping OT Transfers Functional Problem(s): Toilet;Tub/Shower OT Additional Impairment(s): None OT Plan OT Intensity: Minimum of 1-2 x/day, 45 to 90 minutes OT Frequency: 5 out of 7  days OT Duration/Estimated Length of Stay: 7-10 d OT Treatment/Interventions: Balance/vestibular training;Community reintegration;Discharge planning;Cognitive remediation/compensation;Disease mangement/prevention;DME/adaptive equipment instruction;Functional electrical stimulation;Functional mobility training;Neuromuscular re-education;Pain management;Patient/family education;Psychosocial support;Self Care/advanced ADL retraining;Skin care/wound managment;Splinting/orthotics;Therapeutic Activities;Therapeutic Exercise;UE/LE Strength taining/ROM;UE/LE Coordination activities;Visual/perceptual remediation/compensation;Wheelchair propulsion/positioning OT Self Feeding Anticipated Outcome(s): indep OT Basic Self-Care Anticipated Outcome(s): mod I UB, Supervision LB with AE OT Toileting Anticipated Outcome(s): Supervision OT Bathroom Transfers Anticipated Outcome(s): Supervision OT Recommendation Recommendations for Other Services: Neuropsych consult;Therapeutic Recreation consult Therapeutic Recreation Interventions: Kitchen group;Outing/community reintergration Patient destination: Home Follow Up Recommendations: Home health OT;Outpatient OT Equipment Recommended: Tub/shower bench Equipment Details: TTB- pt has all other DME and 2 reachers   OT Evaluation Precautions/Restrictions  Falls, spinal  Chart Reviewed: Yes Family/Caregiver Present: No Vital Signs Therapy Vitals Temp: 98.2 F (36.8 C) Temp Source: Oral Pulse Rate: 92 Resp: 18 BP: 126/71 Patient Position (if appropriate): Sitting Oxygen Therapy SpO2: 95 % O2 Device: Room Air Pain Pain Assessment Pain Scale: 0-10 (Simultaneous filing. User may not have seen previous data.) Pain Score: 5  (Simultaneous filing. User may not have seen previous data.) Pain Type: Surgical pain Pain Location: Back (Simultaneous filing. User may not have seen previous data.) Pain Orientation: Lower Pain Descriptors / Indicators: Aching Pain  Onset: On-going Patients Stated Pain Goal: 2 Pain Intervention(s): Pain med given for lower pain score than stated, per patient request;Repositioned Multiple Pain Sites: No Home Living/Prior Functioning Home Living Available Help at  Discharge: Family, Available 24 hours/day Type of Home: House Home Access: Stairs to enter [employer] of Steps: 1 Entrance Stairs-Rails: None Home Layout: One level Bathroom Shower/Tub: Associate Professor: Yes Additional Comments: lives at home with husband, has other family support if needed, one step to enter  Lives With: Spouse IADL History Homemaking Responsibilities: Yes Meal Prep Responsibility: Secondary Laundry Responsibility: Secondary Cleaning Responsibility: Secondary Shopping Responsibility: Secondary Current License: No Education: business school 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, movies, family Prior Function Level of Independence: Needs assistance with ADLs, Requires assistive device for independence Bath: Minimal Dressing: Minimal  Able to Take Stairs?: Yes Driving: No Vocation: Retired Administrator, sports Baseline Vision/History: 1 Wears glasses Ability to See in Adequate Light: 0 Adequate Patient Visual Report: No change from baseline Vision Assessment?: No apparent visual deficits Additional Comments: vestibular testing and tx ongoing Perception  Perception: Within Functional Limits Praxis Praxis: Intact Cognition Cognition Overall Cognitive Status: Impaired/Different from baseline Arousal/Alertness: Awake/alert Orientation Level: Person;Place;Situation Memory: Impaired Memory Impairment: Decreased recall of new information Awareness: Appears intact Problem Solving: Appears intact Safety/Judgment: Appears intact Brief Interview for Mental Status (BIMS) Repetition of Three Words (First Attempt): 3 Temporal  Orientation: Year: Correct Temporal Orientation: Month: Accurate within 5 days Temporal Orientation: Day: Correct Recall: "Sock": Yes, no cue required Recall: "Blue": Yes, no cue required Recall: "Bed": Yes, no cue required BIMS Summary Score: 15 Sensation Sensation Light Touch: Impaired by gross assessment (reports B LE numbness and tingling) Hot/Cold: Appears Intact Proprioception: Appears Intact Stereognosis: Appears Intact Coordination Gross Motor Movements are Fluid and Coordinated: No Fine Motor Movements are Fluid and Coordinated: No Coordination and Movement Description: retropulsion, positerior lean Finger Nose Finger Test: slowed B ly 9 Hole Peg Test: 28 sec R, 32 sec L mildly slowed for age and gender Bly Motor  Motor Motor: Ataxia;Abnormal postural alignment and control Motor - Skilled Clinical Observations: cues for forward weight shift, + posterior lean tendedncies, cues to weight shift forward  Trunk/Postural Assessment  Cervical Assessment Cervical Assessment: Within Functional Limits Thoracic Assessment Thoracic Assessment: Exceptions to [facility] Lumbar Assessment Lumbar Assessment: Exceptions to [facility] Postural Control Postural Control: Deficits on evaluation Righting Reactions: delayed Protective Responses: delayed Postural Limitations: posterior pelvis and trunk lean  Balance Balance Balance Assessed: Yes Static Sitting Balance Static Sitting - Balance Support: Feet supported Static Sitting - Level of Assistance: 5: Stand by assistance Dynamic Sitting Balance Dynamic Sitting - Balance Support: Feet supported Dynamic Sitting - Level of Assistance: 5: Stand by assistance Static Standing Balance Static Standing - Balance Support: During functional activity Static Standing - Level of Assistance: 4: Min assist Dynamic Standing Balance Dynamic Standing - Balance Support: During functional activity;Bilateral upper extremity supported Dynamic Standing - Level of  Assistance: 3: Mod assist Extremity/Trunk Assessment RUE Assessment RUE Assessment: Exceptions to [facility] General Strength Comments: grossly 4-/5 LUE Assessment LUE Assessment: Exceptions to [facility] General Strength Comments: grossly 4-/5  Care Tool Care Tool Self Care Eating   Eating Assist Level: Set up assist    Oral Care         Bathing   Body parts bathed by patient: Right arm;Left arm;Chest;Abdomen;Front perineal area;Right upper leg;Left upper leg;Face Body parts bathed by helper: Right lower leg;Left lower leg;Buttocks   Assist Level: Moderate Assistance - Patient 50 - 74%    Upper Body Dressing(including orthotics)   What is the patient wearing?: Pull over shirt;Orthosis   Assist Level: Minimal Assistance - Patient >  75%    Lower Body Dressing (excluding footwear)   What is the patient wearing?: Underwear/pull up;Pants Assist for lower body dressing: Moderate Assistance - Patient 50 - 74%    Putting on/Taking off footwear     Assist for footwear: Moderate Assistance - Patient 50 - 74%       Care Tool Toileting Toileting activity   Assist for toileting: Minimal Assistance - Patient > 75%     Care Tool Bed Mobility Roll left and right activity   Roll left and right assist level: Contact Guard/Touching assist    Sit to lying activity   Sit to lying assist level: Contact Guard/Touching assist    Lying to sitting on side of bed activity   Lying to sitting on side of bed assist level: the ability to move from lying on the back to sitting on the side of the bed with no back support.: Contact Guard/Touching assist     Care Tool Transfers Sit to stand transfer   Sit to stand assist level: Minimal Assistance - Patient > 75%    Chair/bed transfer   Chair/bed transfer assist level: Minimal Assistance - Patient > 75%     Toilet transfer   Assist Level: Minimal Assistance - Patient > 75%     Care Tool Cognition  Expression of Ideas and Wants Expression of Ideas  and Wants: 4. Without difficulty (complex and basic) - expresses complex messages without difficulty and with speech that is clear and easy to understand  Understanding Verbal and Non-Verbal Content Understanding Verbal and Non-Verbal Content: 4. Understands (complex and basic) - clear comprehension without cues or repetitions   Memory/Recall Ability Memory/Recall Ability : Current season;Location of own room;That he or she is in a hospital/hospital unit   Refer to Care Plan for Long Term Goals  SHORT TERM GOAL WEEK 1    Recommendations for other services: Neuropsych and Adult nurse group, Stress management, and Outing/community reintegration   Skilled Therapeutic Intervention ADL ADL Equipment Provided: Long-handled shoe horn Eating: Set up Where Assessed-Eating: Wheelchair Grooming: Contact guard Where Assessed-Grooming: Sitting at sink Upper Body Bathing: Minimal assistance Where Assessed-Upper Body Bathing: Sitting at sink Lower Body Bathing: Moderate assistance Where Assessed-Lower Body Bathing: Sitting at sink;Standing at sink Upper Body Dressing: Contact guard Where Assessed-Upper Body Dressing: Sitting at sink Lower Body Dressing: Moderate assistance Where Assessed-Lower Body Dressing: Sitting at sink;Standing at sink Toileting: Minimal assistance Where Assessed-Toileting: Toilet;Bedside Commode Toilet Transfer: Minimal assistance Toilet Transfer Method: Stand pivot Tub/Shower Transfer: Moderate assistance Tub/Shower Transfer Method: Stand pivot Film/video editor: Insurance underwriter Method: Stand pivot Mobility  Bed Mobility Bed Mobility: Rolling Right;Rolling Left;Right Sidelying to Sit Rolling Right: Contact Guard/Touching assist Rolling Left: Contact Guard/Touching assist Right Sidelying to Sit: Minimal Assistance - Patient > 75%  OT Intervention/Treatment:   Session 1:  Pt seen for full initial OT evaluation  and training session this am. Pt in bed upon OT arrival. OT introduced role of therapy and purpose of session. Pt  open to all presented assessment and training this visit. Pt requested toileting and was able to move from bed to EOB with CGA and amb with RW with min A to toilet. OT provided set up to complete peri hygiene and required CGA.  OT assisted and assessed ADL's, mobility, vision, sensation. cognition/lang, G/FMC, strength and balance throughout session. See above for levels. Pt with some delayed processing, s/s of vestibular deficits, effects of MS hx and posterior tendencies for  balance with overall decreased graded control. Educated on spinal precautions and LSO needs and introduced LB reacher and LH sponge use. Pt will benefit from skilled OT services at CIR to maximize function and safety with recommendation to return home with mod I for BADL's and S for higher level activity with HH vs outpt OT services upon d/c home. Pt left at end of session in w/c with chair alarm set, tray table and nurse call bell within reach.   Session 2:  Pt seen for 2nd skilled OT session this pm. Pt's husband present for initial part of session then visitors arrived. OT training with use of [facility] for donning slip on shoes with min A. Grip strength and 9 HPT tested. See results in eval with mild deficits in both. OT transported pt to address TTB training in tub room. Pt required min-mod a overall for transfer and LE mngt. OT educated on spinal precautions. Once back in room, pt's visitors were present and pt remained up in w/c with all needs, nurse call button and safety alarm engaged.   Pain: reported 5/10 back pain with pillow positioning for relief     Discharge Criteria: Patient will be discharged from OT if patient refuses treatment 3 consecutive times without medical reason, if treatment goals not met, if there is a change in medical status, if patient makes no progress towards goals or if patient is discharged  from hospital.  The above assessment, treatment plan, treatment alternatives and goals were discussed and mutually agreed upon: by patient  Vicenta Dunning 08/20/2022, 3:35 PM

## 2022-08-20 NOTE — Progress Notes (Signed)
PROGRESS NOTE   Subjective/Complaints: Pt had some back pain last night which affected sleep. Otherwise did ok. Ready for therapies this morning.   ROS: Patient denies fever, rash, sore throat, blurred vision, dizziness, nausea, vomiting, diarrhea, cough, shortness of breath or chest pain,   headache, or mood change.    Objective:   No results found. Recent Labs    08/17/22 1144  WBC 7.3  HGB 10.9*  HCT 34.4*  PLT 179   No results for input(s): "NA", "K", "CL", "CO2", "GLUCOSE", "BUN", "CREATININE", "CALCIUM" in the last 72 hours.  Intake/Output Summary (Last 24 hours) at 08/20/2022 0903 Last data filed at 08/20/2022 1610 Gross per 24 hour  Intake 240 ml  Output --  Net 240 ml        Physical Exam: Vital Signs Blood pressure (!) 148/63, pulse 81, temperature 98.3 F (36.8 C), temperature source Oral, resp. rate 18, height 5' (1.524 m), weight 51.7 kg, SpO2 100%.  General: Alert and oriented x 3, No apparent distress HEENT: Head is normocephalic, atraumatic, PERRLA, EOMI, sclera anicteric, oral mucosa pink and moist, dentition intact, ext ear canals clear,  Neck: Supple without JVD or lymphadenopathy Heart: Reg rate and rhythm. No murmurs rubs or gallops Chest: CTA bilaterally without wheezes, rales, or rhonchi; no distress Abdomen: Soft, non-tender, non-distended, bowel sounds positive. Extremities: No clubbing, cyanosis, or edema. Pulses are 2+ Psych: Pt's affect is appropriate. Pt is cooperative Skin: back incision with honeycomb dressing. Some blood at upper end of incision Neuro:  Alert and oriented x 3. Normal insight and awareness. Intact Memory. Normal language and speech. Cranial nerve exam unremarkable. MMT: UE 5/5. LE 3 to 3+ HF, KE and 4- to 4/5 ADF/PF. Decreased LT below knees and in fingers. DTR's tr to 1+. .   Musculoskeletal:  LB TTP and with bed mobility   Assessment/Plan: 1. Functional deficits  which require 3+ hours per day of interdisciplinary therapy in a comprehensive inpatient rehab setting. Physiatrist is providing close team supervision and 24 hour management of active medical problems listed below. Physiatrist and rehab team continue to assess barriers to discharge/monitor patient progress toward functional and medical goals  Care Tool:  Bathing    Body parts bathed by patient: Right arm, Left arm, Chest, Abdomen, Front perineal area, Right upper leg, Left upper leg, Face   Body parts bathed by helper: Right lower leg, Left lower leg, Buttocks     Bathing assist Assist Level: Moderate Assistance - Patient 50 - 74%     Upper Body Dressing/Undressing Upper body dressing   What is the patient wearing?: Pull over shirt, Orthosis    Upper body assist Assist Level: Minimal Assistance - Patient > 75%    Lower Body Dressing/Undressing Lower body dressing      What is the patient wearing?: Underwear/pull up, Pants     Lower body assist Assist for lower body dressing: Moderate Assistance - Patient 50 - 74%     Toileting Toileting    Toileting assist Assist for toileting: Minimal Assistance - Patient > 75%     Transfers Chair/bed transfer  Transfers assist     Chair/bed transfer assist level: Minimal Assistance -  Patient > 75%     Locomotion Ambulation   Ambulation assist              Walk 10 feet activity   Assist           Walk 50 feet activity   Assist           Walk 150 feet activity   Assist           Walk 10 feet on uneven surface  activity   Assist           Wheelchair     Assist               Wheelchair 50 feet with 2 turns activity    Assist            Wheelchair 150 feet activity     Assist          Blood pressure (!) 148/63, pulse 81, temperature 98.3 F (36.8 C), temperature source Oral, resp. rate 18, height 5' (1.524 m), weight 51.7 kg, SpO2 100%.  Medical Problem  List and Plan: 1. Functional deficits secondary to L3 fx s/p L2-L4 fixation and removal of previous hardware.              -hx also significant for relaxing-remitting MS             -patient may shower             -ELOS/Goals: 7-10 days, mod I to supervision goals with PT and OT  --Patient is beginning CIR therapies today including PT and OT              -pt with history of dizziness, LOB. Have requested PT  vestibular eval   2.  Antithrombotics: -DVT/anticoagulation:  Mechanical:  Antiembolism stockings, knee (TED hose) Bilateral lower extremities>>pt walking 120' with therapies and bruises A LOT. Will stay with TEDS and activity OOB             -antiplatelet therapy: none   3. Pain Management/FMS/peripheral neuropathy/RLS: Tylenol, Norco, Zanaflex as needed             -continue gabapentin 900 mg q HS             -on horizant 600mg  at night also-- Horizant Is non-formulary. Husband to bring in from home today? I explained to pt that these were the same meds, just one is long acting. She says the horizant has worked for her RLS 4. Mood/Behavior/Sleep: LCSW to evaluate and provide emotional support             -antipsychotic agents: n/a             -continue Cymbalta 60 mg daily             -clonazepam 0.5 mg prn anxiety             -hx of migraine HA>>on erenumab injection monthly, Nurtec as needed   5. Neuropsych/cognition: This patient is capable of making decisions on her own behalf.   6. Skin/Wound Care: Routine skin care checks             -observe surgical incision for increased drainage 7. Fluids/Electrolytes/Nutrition: Routine Is and Os and follow-up chemistries on Monday             -continue acidophilus, vitamin C, B-complex, D3   8: Hypotension: monitor TID and prn             -continue  Florinef   9: Anemia: continue oral iron and follow-up CBC   10: IBS: continue acidophilus and dicyclomine             -Linzess as needed for constipation--pt doesn't like to use because  it causes urgent blowouts. Holding for now. Prefers to use miralax                         -continue senokot-s and miralax scheduled                         -have miralax prn available also       12. Neurogenic bladder d/t MS             -tends to retain             -first PVR 488 this morning             -continue uroxatral             -oob to void             -uses keflex every other day as prophylaxis against UTI's   13. Addison's disease             -resumed medrol:  4mg  am and 2mg  pm daily      LOS: 1 days A FACE TO FACE EVALUATION WAS PERFORMED  Ranelle Oyster 08/20/2022, 9:03 AM

## 2022-08-20 NOTE — Plan of Care (Signed)
  Problem: RH Balance Goal: LTG: Patient will maintain dynamic sitting balance (OT) Description: LTG:  Patient will maintain dynamic sitting balance with assistance during activities of daily living (OT) Flowsheets (Taken 08/20/2022 0842) LTG: Pt will maintain dynamic sitting balance during ADLs with: Independent with assistive device Goal: LTG Patient will maintain dynamic standing with ADLs (OT) Description: LTG:  Patient will maintain dynamic standing balance with assist during activities of daily living (OT)  Flowsheets (Taken 08/20/2022 0842) LTG: Pt will maintain dynamic standing balance during ADLs with: Supervision/Verbal cueing   Problem: Sit to Stand Goal: LTG:  Patient will perform sit to stand in prep for activites of daily living with assistance level (OT) Description: LTG:  Patient will perform sit to stand in prep for activites of daily living with assistance level (OT) Flowsheets (Taken 08/20/2022 0842) LTG: PT will perform sit to stand in prep for activites of daily living with assistance level: Supervision/Verbal cueing   Problem: RH Grooming Goal: LTG Patient will perform grooming w/assist,cues/equip (OT) Description: LTG: Patient will perform grooming with assist, with/without cues using equipment (OT) Flowsheets (Taken 08/20/2022 0842) LTG: Pt will perform grooming with assistance level of: Independent with assistive device    Problem: RH Bathing Goal: LTG Patient will bathe all body parts with assist levels (OT) Description: LTG: Patient will bathe all body parts with assist levels (OT) Flowsheets (Taken 08/20/2022 0842) LTG: Pt will perform bathing with assistance level/cueing: Supervision/Verbal cueing   Problem: RH Dressing Goal: LTG Patient will perform upper body dressing (OT) Description: LTG Patient will perform upper body dressing with assist, with/without cues (OT). Flowsheets (Taken 08/20/2022 0842) LTG: Pt will perform upper body dressing with assistance  level of: Independent with assistive device Goal: LTG Patient will perform lower body dressing w/assist (OT) Description: LTG: Patient will perform lower body dressing with assist, with/without cues in positioning using equipment (OT) Flowsheets (Taken 08/20/2022 0842) LTG: Pt will perform lower body dressing with assistance level of: Supervision/Verbal cueing   Problem: RH Toileting Goal: LTG Patient will perform toileting task (3/3 steps) with assistance level (OT) Description: LTG: Patient will perform toileting task (3/3 steps) with assistance level (OT)  Flowsheets (Taken 08/20/2022 0842) LTG: Pt will perform toileting task (3/3 steps) with assistance level: Supervision/Verbal cueing   Problem: RH Toilet Transfers Goal: LTG Patient will perform toilet transfers w/assist (OT) Description: LTG: Patient will perform toilet transfers with assist, with/without cues using equipment (OT) Flowsheets (Taken 08/20/2022 0842) LTG: Pt will perform toilet transfers with assistance level of: Supervision/Verbal cueing   Problem: RH Tub/Shower Transfers Goal: LTG Patient will perform tub/shower transfers w/assist (OT) Description: LTG: Patient will perform tub/shower transfers with assist, with/without cues using equipment (OT) Flowsheets (Taken 08/20/2022 0842) LTG: Pt will perform tub/shower stall transfers with assistance level of: Supervision/Verbal cueing

## 2022-08-21 DIAGNOSIS — K582 Mixed irritable bowel syndrome: Secondary | ICD-10-CM | POA: Diagnosis not present

## 2022-08-21 DIAGNOSIS — M792 Neuralgia and neuritis, unspecified: Secondary | ICD-10-CM | POA: Diagnosis not present

## 2022-08-21 DIAGNOSIS — G35 Multiple sclerosis: Secondary | ICD-10-CM | POA: Diagnosis not present

## 2022-08-21 DIAGNOSIS — S32039D Unspecified fracture of third lumbar vertebra, subsequent encounter for fracture with routine healing: Secondary | ICD-10-CM | POA: Diagnosis not present

## 2022-08-21 NOTE — Progress Notes (Signed)
PROGRESS NOTE   Subjective/Complaints: Slept fairly well last night. Husband did not bring horizant from home. Had a pretty good day with therapies yesterday  ROS: Patient denies fever, rash, sore throat, blurred vision, dizziness, nausea, vomiting, diarrhea, cough, shortness of breath or chest pain,  headache, or mood change.    Objective:   No results found. No results for input(s): "WBC", "HGB", "HCT", "PLT" in the last 72 hours.  No results for input(s): "NA", "K", "CL", "CO2", "GLUCOSE", "BUN", "CREATININE", "CALCIUM" in the last 72 hours.  Intake/Output Summary (Last 24 hours) at 08/21/2022 1003 Last data filed at 08/21/2022 0841 Gross per 24 hour  Intake 720 ml  Output --  Net 720 ml        Physical Exam: Vital Signs Blood pressure (!) 162/55, pulse 78, temperature 97.9 F (36.6 C), temperature source Oral, resp. rate 16, height 5' (1.524 m), weight 51.9 kg, SpO2 97%.  Constitutional: No distress . Vital signs reviewed. HEENT: NCAT, EOMI, oral membranes moist Neck: supple Cardiovascular: RRR without murmur. No JVD    Respiratory/Chest: CTA Bilaterally without wheezes or rales. Normal effort    GI/Abdomen: BS +, non-tender, non-distended Ext: no clubbing, cyanosis, or edema Psych: pleasant and cooperative  Skin: back incision with honeycomb dressing--no change in area where there's old blood at superior aspect of incision. Neuro:  Alert and oriented x 3. Normal insight and awareness. Intact Memory. Normal language and speech. Cranial nerve exam unremarkable. MMT: UE 5/5. LE 3 to 3+ HF, KE and 4- to 4/5 ADF/PF. Decreased LT below knees and in fingers. DTR's tr to 1+. .   Musculoskeletal:  LB TTP and with bed mobility   Assessment/Plan: 1. Functional deficits which require 3+ hours per day of interdisciplinary therapy in a comprehensive inpatient rehab setting. Physiatrist is providing close team supervision and  24 hour management of active medical problems listed below. Physiatrist and rehab team continue to assess barriers to discharge/monitor patient progress toward functional and medical goals  Care Tool:  Bathing    Body parts bathed by patient: Right arm, Left arm, Chest, Abdomen, Front perineal area, Right upper leg, Left upper leg, Face   Body parts bathed by helper: Right lower leg, Left lower leg, Buttocks     Bathing assist Assist Level: Moderate Assistance - Patient 50 - 74%     Upper Body Dressing/Undressing Upper body dressing   What is the patient wearing?: Pull over shirt, Orthosis    Upper body assist Assist Level: Minimal Assistance - Patient > 75%    Lower Body Dressing/Undressing Lower body dressing      What is the patient wearing?: Underwear/pull up, Pants     Lower body assist Assist for lower body dressing: Moderate Assistance - Patient 50 - 74%     Toileting Toileting    Toileting assist Assist for toileting: Minimal Assistance - Patient > 75%     Transfers Chair/bed transfer  Transfers assist     Chair/bed transfer assist level: Minimal Assistance - Patient > 75%     Locomotion Ambulation   Ambulation assist      Assist level: Minimal Assistance - Patient > 75% Assistive device: Walker-rolling  Max distance: 150   Walk 10 feet activity   Assist     Assist level: Minimal Assistance - Patient > 75% Assistive device: Walker-rolling   Walk 50 feet activity   Assist    Assist level: Minimal Assistance - Patient > 75% Assistive device: Walker-rolling    Walk 150 feet activity   Assist    Assist level: Minimal Assistance - Patient > 75% Assistive device: Walker-rolling    Walk 10 feet on uneven surface  activity   Assist Walk 10 feet on uneven surfaces activity did not occur: Safety/medical concerns         Wheelchair     Assist Is the patient using a wheelchair?: Yes Type of Wheelchair: Manual     Wheelchair assist level: Supervision/Verbal cueing Max wheelchair distance: 150    Wheelchair 50 feet with 2 turns activity    Assist        Assist Level: Supervision/Verbal cueing   Wheelchair 150 feet activity     Assist      Assist Level: Supervision/Verbal cueing   Blood pressure (!) 162/55, pulse 78, temperature 97.9 F (36.6 C), temperature source Oral, resp. rate 16, height 5' (1.524 m), weight 51.9 kg, SpO2 97%.  Medical Problem List and Plan: 1. Functional deficits secondary to L3 fx s/p L2-L4 fixation and removal of previous hardware.              -hx also significant for relaxing-remitting MS             -patient may shower             -ELOS/Goals: 7-10 days, mod I to supervision goals with PT and OT  -Continue CIR therapies including PT, OT              -pt with history of dizziness, LOB. Have requested PT  vestibular eval   2.  Antithrombotics: -DVT/anticoagulation:  Mechanical:  Antiembolism stockings, knee (TED hose) Bilateral lower extremities>>pt walking 120' with therapies and bruises A LOT. Will stay with TEDS and activity OOB             -antiplatelet therapy: none   3. Pain Management/FMS/peripheral neuropathy/RLS: Tylenol, Norco, Zanaflex as needed             -continue gabapentin 900 mg q HS             -have discussed meds for RLS with pt. She pays $100 a month for horizant which she takes on top of gabapentin. She hasn't noticed a big change since she's been without while in hospital. Is interested in stopping it. I told her that we could add 300mg  of gabapentin in the am or gabapentin 300mg  bid in horizant's place. May do just fine with the 900mg  gabapentin at bedtime. 4. Mood/Behavior/Sleep: LCSW to evaluate and provide emotional support             -antipsychotic agents: n/a             -continue Cymbalta 60 mg daily             -clonazepam 0.5 mg prn anxiety             -hx of migraine HA>>on erenumab injection monthly, Nurtec as  needed   5. Neuropsych/cognition: This patient is capable of making decisions on her own behalf.   6. Skin/Wound Care: Routine skin care checks             -observe surgical incision for  increased drainage 7. Fluids/Electrolytes/Nutrition: Routine Is and Os and follow-up chemistries on Monday             -continue acidophilus, vitamin C, B-complex, D3   8: Hypotension: monitor TID and prn             -continue Florinef   9: Anemia: continue oral iron and follow-up CBC   10: IBS: continue acidophilus and dicyclomine             -Linzess as needed for constipation--pt doesn't like to use because it causes urgent blowouts. Holding for now. Prefers to use miralax                         -continue senokot-s and miralax scheduled                         -have miralax prn available also   -last bm 7/13 without any extra medication     12. Neurogenic bladder d/t MS             -tends to retain             -first PVR 488 this morning             -continue uroxatral             -oob to void             -uses keflex every other day as prophylaxis against UTI's   13. Addison's disease             -resumed medrol:  4mg  am and 2mg  pm daily      LOS: 2 days A FACE TO FACE EVALUATION WAS PERFORMED  Katrina Rivera 08/21/2022, 10:03 AM

## 2022-08-22 DIAGNOSIS — S32001A Stable burst fracture of unspecified lumbar vertebra, initial encounter for closed fracture: Secondary | ICD-10-CM | POA: Diagnosis not present

## 2022-08-22 LAB — CBC WITH DIFFERENTIAL/PLATELET
Abs Immature Granulocytes: 0.04 10*3/uL (ref 0.00–0.07)
Basophils Absolute: 0 10*3/uL (ref 0.0–0.1)
Basophils Relative: 0 %
Eosinophils Absolute: 0.1 10*3/uL (ref 0.0–0.5)
Eosinophils Relative: 1 %
HCT: 28.4 % — ABNORMAL LOW (ref 36.0–46.0)
Hemoglobin: 9 g/dL — ABNORMAL LOW (ref 12.0–15.0)
Immature Granulocytes: 1 %
Lymphocytes Relative: 21 %
Lymphs Abs: 1.1 10*3/uL (ref 0.7–4.0)
MCH: 31.6 pg (ref 26.0–34.0)
MCHC: 31.7 g/dL (ref 30.0–36.0)
MCV: 99.6 fL (ref 80.0–100.0)
Monocytes Absolute: 0.5 10*3/uL (ref 0.1–1.0)
Monocytes Relative: 9 %
Neutro Abs: 3.4 10*3/uL (ref 1.7–7.7)
Neutrophils Relative %: 68 %
Platelets: 194 10*3/uL (ref 150–400)
RBC: 2.85 MIL/uL — ABNORMAL LOW (ref 3.87–5.11)
RDW: 13 % (ref 11.5–15.5)
WBC: 5.1 10*3/uL (ref 4.0–10.5)
nRBC: 0 % (ref 0.0–0.2)

## 2022-08-22 LAB — COMPREHENSIVE METABOLIC PANEL
ALT: 16 U/L (ref 0–44)
AST: 25 U/L (ref 15–41)
Albumin: 2.7 g/dL — ABNORMAL LOW (ref 3.5–5.0)
Alkaline Phosphatase: 41 U/L (ref 38–126)
Anion gap: 6 (ref 5–15)
BUN: 16 mg/dL (ref 8–23)
CO2: 25 mmol/L (ref 22–32)
Calcium: 8.9 mg/dL (ref 8.9–10.3)
Chloride: 107 mmol/L (ref 98–111)
Creatinine, Ser: 0.73 mg/dL (ref 0.44–1.00)
GFR, Estimated: >60 mL/min (ref >60)
Glucose, Bld: 86 mg/dL (ref 70–99)
Potassium: 3.6 mmol/L (ref 3.5–5.1)
Sodium: 138 mmol/L (ref 135–145)
Total Bilirubin: 0.5 mg/dL (ref 0.3–1.2)
Total Protein: 5.4 g/dL — ABNORMAL LOW (ref 6.5–8.1)

## 2022-08-22 NOTE — Plan of Care (Signed)
  Problem: Consults Goal: RH GENERAL PATIENT EDUCATION Description: See Patient Education module for education specifics. Outcome: Progressing   Problem: RH BOWEL ELIMINATION Goal: RH STG MANAGE BOWEL WITH ASSISTANCE Description: STG Manage Bowel with min Assistance. Outcome: Progressing Goal: RH STG MANAGE BOWEL W/MEDICATION W/ASSISTANCE Description: STG Manage Bowel with Medication with min Assistance. Outcome: Progressing   Problem: RH SKIN INTEGRITY Goal: RH STG ABLE TO PERFORM INCISION/WOUND CARE W/ASSISTANCE Description: STG Able To Perform Incision/Wound Care With min Assistance. Outcome: Progressing   Problem: RH SAFETY Goal: RH STG ADHERE TO SAFETY PRECAUTIONS W/ASSISTANCE/DEVICE Description: STG Adhere to Safety Precautions With min Assistance/Device. Outcome: Progressing Goal: RH STG DECREASED RISK OF FALL WITH ASSISTANCE Description: STG Decreased Risk of Fall With cueing Assistance. Outcome: Progressing   Problem: RH PAIN MANAGEMENT Goal: RH STG PAIN MANAGED AT OR BELOW PT'S PAIN GOAL Description: Pain will be managed 4 out of 10 on pain scale with PRN medications min assist  Outcome: Progressing   Problem: RH KNOWLEDGE DEFICIT GENERAL Goal: RH STG INCREASE KNOWLEDGE OF SELF CARE AFTER HOSPITALIZATION Description: Patient/caregiver will be able to manage medications, wound care, and self care from nursing education, nursing handouts, and other sources independently  Outcome: Progressing   Problem: Education: Goal: Ability to verbalize activity precautions or restrictions will improve Outcome: Progressing Goal: Knowledge of the prescribed therapeutic regimen will improve Outcome: Progressing Goal: Understanding of discharge needs will improve Outcome: Progressing   Problem: Activity: Goal: Ability to avoid complications of mobility impairment will improve Outcome: Progressing Goal: Ability to tolerate increased activity will improve Outcome:  Progressing Goal: Will remain free from falls Outcome: Progressing   Problem: Bowel/Gastric: Goal: Gastrointestinal status for postoperative course will improve Outcome: Progressing   Problem: Clinical Measurements: Goal: Ability to maintain clinical measurements within normal limits will improve Outcome: Progressing Goal: Postoperative complications will be avoided or minimized Outcome: Progressing Goal: Diagnostic test results will improve Outcome: Progressing   Problem: Pain Management: Goal: Pain level will decrease Outcome: Progressing   Problem: Skin Integrity: Goal: Will show signs of wound healing Outcome: Progressing   Problem: Health Behavior/Discharge Planning: Goal: Identification of resources available to assist in meeting health care needs will improve Outcome: Progressing   Problem: Bladder/Genitourinary: Goal: Urinary functional status for postoperative course will improve Outcome: Progressing

## 2022-08-22 NOTE — Progress Notes (Signed)
Occupational Therapy Session Note  Patient Details  Name: Katrina Rivera MRN: [id number] Date of Birth: 02-14-1945  Today's Date: 08/22/2022 OT Individual Time: 1000-1103 & 1300-1345 OT Individual Time Calculation (min): 63 min & 45 min    Short Term Goals: Week 1:  OT Short Term Goal 1 (Week 1): STG=LTG's based on LOS  Skilled Therapeutic Interventions/Progress Updates:      Therapy Documentation Precautions:  Precautions Precautions: Fall, Back Precaution Booklet Issued: No Precaution Comments: recalled 2/3 from prior surgeries, pt with 4 falls since last surgery Required Braces or Orthoses: Spinal Brace Spinal Brace: Applied in sitting position, Lumbar corset Restrictions Weight Bearing Restrictions: No  Session 1 General:  "Hi sweetie!" Pt supine in bed upon OT arrival, agreeable to OT session.  Pain: 2/10 reported in lower back, positioning and activity provided for pain management   ADL: Transfers: Pt ambulated from room>< therapy gym short community distances with RW at [facility] with W/C follow.  Toileting: CGA overall, CGA for pants management over hips, SBA for peri hygiene      Exercises: Pt seated at table completing FMC BUE exercises with pink theraputty in order to strengthen fine motor control of bilateral hands to complete ADLs such as buttoning shirt. Pt completed rolling putty into thin log to strengthen arches of hands, pt then completed making 15 balls of putty by using power grip and pincer grasp. Pt then completed finding small pegs within pink putty in order to strengthen inner hand/finger musculature.    Other Treatments:  Pt issued UE theraband HEP in order to increase functional strength, andurance and activity tolerance in order to increase independence in ADLs such as bathing. Pt issued yellow theraband and discussed direction/technique of exercises, demonstrating verbal understanding. Exercises on HEP as follows: -elbow extensions - shoulder  horizontal abduction -bicep curls  -shoulder flexion -diagonal shoulder flexion -external rotation   Pt seated in W/C at end of session with W/C alarm donned, call light within reach and 4Ps assessed.    Session2 General: "Long time no see!" Pt seated in W/C upon OT arrival, agreeable to OT.  Pain: No pain reported/noted.   ADL: Transfers: SBA with RW community mobility from therapy gym><room with no LOB/SOB, occasional VC for increased stride length, no rest breaks necessary, increased time to complete d/t slow walking pace. Pt completed various sit to stand and stand pivot transfers with SBA with VC for RW management and safety    Exercises: Pt completed 10 minutes of arm bike, 10 min forward in order to increase BUE/BLEstrength and endurance in preparation for increased independence in ADLs such as bathing. Rest break after 5 min, on level 3 resistance.    Other Treatments:  OT educated pt on purpose of compression socks and where to purchase for home use. OT recommended purchasing on [employer] for ease of service.   Pt seated in W/C at end of session with W/C alarm donned, call light within reach and 4Ps assessed.    Therapy/Group: Individual Therapy  Velia Meyer, OTD, OTR/L 08/22/2022, 4:47 PM

## 2022-08-22 NOTE — Progress Notes (Signed)
Inpatient Rehabilitation Care Coordinator Assessment and Plan Patient Details  Name: Katrina Rivera MRN: [id number] Date of Birth: Oct 07, 1945  Today's Date: 08/22/2022  Hospital Problems: Principal Problem:   Lumbar vertebral fracture [facility])  Past Medical History:  Past Medical History:  Diagnosis Date   Addison's disease (HCC)    takes Solu Cortef daily   Anemia    takes Ferrous Sulfate daily   Anxiety    takes Xanax nightly   Arthritis    Chronic back pain    stenosis   Depression    takes Cymbalta daily   Dizziness    if b/p drops    Fibromyalgia    History of blood transfusion    no abnormal reaction noted   History of bronchitis    many yrs ago    Hypokalemia    takes Potassium daily   Hypotension    takes Florinef daily   IBS (irritable bowel syndrome)    takes Align daily   Insomnia    takes Trazodone nightly   Joint pain    Multiple sclerosis (HCC)    doesn't take any meds   Multiple sclerosis (HCC)    New onset headache 12/22/2021   Nocturia    Osteoporosis    Palpitations    Peripheral neuropathy    Primary localized osteoarthritis of left knee 08/11/2020   Restless leg syndrome    Seasonal allergies    takes Zyrtec daily;uses Flonase daily as needed   Syncope    when missed methylprednisolone dose   Urinary frequency    takes Flomax daily   Weakness    numbness and tingling   Past Surgical History:  Past Surgical History:  Procedure Laterality Date   ABDOMINAL HYSTERECTOMY  02/07/1986   APPENDECTOMY  02/07/1986   CESAREAN SECTION  1973/1977   x2   CHOLECYSTECTOMY  02/08/1995   COLECTOMY  02/08/1988   COLONOSCOPY     ESOPHAGOGASTRODUODENOSCOPY     EYE SURGERY     bilateral - /w IOL- cataracts   FRACTURE SURGERY Right    rods and screws-right leg   LUMBAR LAMINECTOMY/DECOMPRESSION MICRODISCECTOMY Left 01/24/2013   Procedure: LUMBAR FIVE TO SACRAL ONE LUMBAR LAMINECTOMY/DECOMPRESSION MICRODISCECTOMY 1 LEVEL;  Surgeon: Tia Alert,  MD;  Location: [facility];  Service: Neurosurgery;  Laterality: Left;   MAXIMUM ACCESS (MAS)POSTERIOR LUMBAR INTERBODY FUSION (PLIF) 1 LEVEL N/A 06/18/2014   Procedure: MAXIMUM ACCESS SURGERY POSTERIOR LUMBAR INTERBODY FUSION LUMBAR FIVE TO SACRAL ONE ;  Surgeon: Tia Alert, MD;  Location: [facility];  Service: Neurosurgery;  Laterality: N/A;   SPINAL CORD STIMULATOR BATTERY EXCHANGE Right 07/30/2021   Procedure: Spinal cord stimulator battery replacement;  Surgeon: Tia Alert, MD;  Location: [facility] OR;  Service: Neurosurgery;  Laterality: Right;   SPINAL CORD STIMULATOR IMPLANT     TOTAL KNEE ARTHROPLASTY Left 08/24/2020   Procedure: TOTAL KNEE ARTHROPLASTY;  Surgeon: Salvatore Marvel, MD;  Location: [facility];  Service: Orthopedics;  Laterality: Left;   Social History:  reports that she has never smoked. She has never used smokeless tobacco. She reports that she does not drink alcohol and does not use drugs.  Family / Support Systems Marital Status: Married Patient Roles: Spouse, Parent Spouse/Significant Other: Jomarie Longs [phone] home  [phone]-cell Children: Two grown son's who are local and involved Other Supports: neighbors and church members Anticipated Caregiver: husband Ability/Limitations of Caregiver: In good health can assist Caregiver Availability: 24/7 Family Dynamics: Close with children and extended family. She feels  she has good social supports and husband can do what is needed.  Social History Preferred language: English Religion: Presbyterian Cultural Background: No issues Education: HS Health Literacy - How often do you need to have someone help you when you read instructions, pamphlets, or other written material from your doctor or pharmacy?: Never Writes: Yes Employment Status: Retired Marine scientist Issues: No issues Guardian/Conservator: None-according to MD pt is capable of making her own decisions while here   Abuse/Neglect Abuse/Neglect  Assessment Can Be Completed: Yes Physical Abuse: Denies Verbal Abuse: Denies Sexual Abuse: Denies Exploitation of patient/patient's resources: Denies Self-Neglect: Denies  Patient response to: Social Isolation - How often do you feel lonely or isolated from those around you?: Never  Emotional Status Pt's affect, behavior and adjustment status: Pt is motivated to recover and get back to her independent level. She has been falling at home due to her back issues and hopes now with her surgery she will do better. Recent Psychosocial Issues: other health issues but were managed Psychiatric History: History of depression takes medications which she finds helpful. Would benefit from seeing neuro-psych if still here when he returns from vacation Substance Abuse History: No issues  Patient / Family Perceptions, Expectations & Goals Pt/Family understanding of illness & functional limitations: Pt can explain her surgery and seems to be doing well and pain is managed. She does talk with the MD and feels has a good understanding of her plan moving forward. Premorbid pt/family roles/activities: Wife, mother, grandmother, church member, neighbor, Catering manager Anticipated changes in roles/activities/participation: resume Pt/family expectations/goals: Pt states: " I hope to be able to take care of myself and move on my own when I leave here."  [employer]: None Premorbid Home Care/DME Agencies: Other (Comment) (has rw, tollator, tub seat, cane and wc) Transportation available at discharge: husband Is the patient able to respond to transportation needs?: Yes In the past 12 months, has lack of transportation kept you from medical appointments or from getting medications?: No In the past 12 months, has lack of transportation kept you from meetings, work, or from getting things needed for daily living?: No  Discharge Planning Living Arrangements: Spouse/significant other Support Systems:  Spouse/significant other, Children, Other relatives, Friends/neighbors, Church/faith community Type of Residence: Private residence Insurance Resources: Media planner (specify) ([employer]) Financial Resources: Social Security, Family Support Financial Screen Referred: No Living Expenses: Own Money Management: Patient, Spouse Does the patient have any problems obtaining your medications?: No Home Management: both husband and pt Patient/Family Preliminary Plans: Return home with husband who is able to assist if needed. Pt is hopeful she will get to mod/i levle and not need physical assist. Goals are supervision with cues will see if reaches while here. Care Coordinator Anticipated Follow Up Needs: HH/OP  Clinical Impression Pleasant motivated female who is doing well and making progress. She is hopeful he can reach mod/I level. Her husband is able to assist if needed and will be here later today. Aware team conference tomorrow and will meet with after and discuss goals and target discharge date.  Lucy Chris 08/22/2022, 11:38 AM

## 2022-08-22 NOTE — Progress Notes (Addendum)
Met with patient, oriented to rehab and team conference every Tuesday. Discussed MS, reports that has had trouble with flow of urine PTA and "doesn't have a colon" and has just liquid stool. Discussed falls and ways to prevent. Reports that has had MD since 1984. Concerned about the amount of food she is given as she doesn't eat this way at home. Encouraged to eat  what she can. Did discuss anti-inflammatory diet and may help relieve some the arthritis and may help with some of the MS systems. Pain is manageable currently. Discussed back brace and being able to take on and off. All needs met, call bell in reach.   Patient also asked for information on anxiety relief. Provided to her.

## 2022-08-22 NOTE — Progress Notes (Signed)
Inpatient Rehabilitation Center Individual Statement of Services  Patient Name:  Katrina Rivera  Date:  08/22/2022  Welcome to the Inpatient Rehabilitation Center.  Our goal is to provide you with an individualized program based on your diagnosis and situation, designed to meet your specific needs.  With this comprehensive rehabilitation program, you will be expected to participate in at least 3 hours of rehabilitation therapies Monday-Friday, with modified therapy programming on the weekends.  Your rehabilitation program will include the following services:  Physical Therapy (PT), Occupational Therapy (OT), 24 hour per day rehabilitation nursing, Care Coordinator, Rehabilitation Medicine, Nutrition Services, and Pharmacy Services  Weekly team conferences will be held on Tuesday to discuss your progress.  Your Inpatient Rehabilitation Care Coordinator will talk with you frequently to get your input and to update you on team discussions.  Team conferences with you and your family in attendance may also be held.  Expected length of stay: 7-10 days  Overall anticipated outcome: supervision-mod/I level  Depending on your progress and recovery, your program may change. Your Inpatient Rehabilitation Care Coordinator will coordinate services and will keep you informed of any changes. Your Inpatient Rehabilitation Care Coordinator's name and contact numbers are listed  below.  The following services may also be recommended but are not provided by the Inpatient Rehabilitation Center:   Home Health Rehabiltiation Services Outpatient Rehabilitation Services    Arrangements will be made to provide these services after discharge if needed.  Arrangements include referral to agencies that provide these services.  Your insurance has been verified to be:  [employer] Your primary doctor is:  [name]  Pertinent information will be shared with your doctor and your insurance  company.  Inpatient Rehabilitation Care Coordinator:  Dossie Der, [place] [phone] or Luna Glasgow  Information discussed with and copy given to patient by: Lucy Chris, 08/22/2022, 11:41 AM

## 2022-08-22 NOTE — Progress Notes (Signed)
Physical Therapy Session Note  Patient Details  Name: Katrina Rivera MRN: [id number] Date of Birth: Feb 17, 1945  Today's Date: 08/22/2022 PT Individual Time: 0800-0900, 9528-4132 PT Individual Time Calculation (min): 60 min , 43 min   Short Term Goals: Week 1:  PT Short Term Goal 1 (Week 1): STG=LTG 2/2 ELOS.  Skilled Therapeutic Interventions/Progress Updates:      Therapy Documentation Precautions:  Precautions Precautions: Fall, Back Precaution Booklet Issued: No Precaution Comments: recalled 2/3 from prior surgeries, pt with 4 falls since last surgery Required Braces or Orthoses: Spinal Brace Spinal Brace: Applied in sitting position, Lumbar corset Restrictions Weight Bearing Restrictions: No  Treatment Session 1:   Pt received semi-reclined in bed, agreeable to PT and without reports of pain in session. Pt (S) with bed mobility and for upper body dressing with minimal verbal cueing for technique with hooking of undergarment. Pt mod A with lower body dressing as without reacher and unable to bend 2/2 spinal precautions. PT donned lumbar corset and ambulated with RW to sink and performed self care tasks standing at sink with RW for balance (S). Pt ambulated ~300 ft to main gym with good step length and foot clearance. Pt required seated rest break before navigating curb step with RW (CGA) and visual demonstration for technique. Pt ambulated to room and requested to return to bed and left semi-reclined with all needs in reach and alarm on.    Treatment Session 2:   Pt received seated in recliner at bedside and agreeable to PT session with emphasis on vestibular assessment.   Onset: ~1 year ago  Duration: constant Symptoms: vertigo, lightheadedness, tinnitus, blurry vision, nausea  Aggravating factors: standing up, supine<>sit, turn head, look down Easing factors: lying down, close eyes, medications (acetaminophen)   Pt also reports head injury with most recent fall and  reports memory impairments, light + noise sensitivity, fatigue, HA and migraines.   PT performed chart review to look at previous vestibular evaluation indicating VOR impairment and negative peripheral BPPV testing.   PT instructed pt in VOR x 1 at non-stimulating background ~40 seconds in which pt reported neck pain and tightness. PT performed objective assessment and pt tender to C2-C7 spinous processes and reported nausea.   PT notified nursing who performed additional follow up assessment. Pt requested to return to bed and (S) with transfer to bed and left with all needs in reach and alarm on.     Therapy/Group: Individual Therapy  Truitt Leep Truitt Leep PT, DPT  08/22/2022, 5:03 AM

## 2022-08-22 NOTE — IPOC Note (Signed)
Overall Plan of Care [facility]) Patient Details Name: Katrina Rivera MRN: [id number] DOB: [birth year]  Admitting Diagnosis: Lumbar vertebral fracture [facility])  Hospital Problems: Principal Problem:   Lumbar vertebral fracture (HCC)     Functional Problem List: Nursing Bowel, Safety, Sensory, Endurance, Medication Management, Motor, Pain  PT Balance, Sensory, Endurance, Motor, Pain  OT Balance, Endurance, Motor, Safety, Skin Integrity, Sensory, Pain  SLP    TR         Basic ADL's: OT Grooming, Toileting, Bathing, Dressing     Advanced  ADL's: OT Simple Meal Preparation, Light Housekeeping     Transfers: PT Bed Mobility, Bed to Chair, Set designer, Occupational psychologist, Research scientist (life sciences): PT Ambulation, Psychologist, prison and probation services, Stairs     Additional Impairments: OT None  SLP        TR      Anticipated Outcomes Item Anticipated Outcome  Self Feeding indep  Swallowing      Basic self-care  mod I UB, Supervision LB with AE  Engineer, technical sales Transfers Supervision  Bowel/Bladder  continent B/B  Transfers  mod I  Locomotion  supervision w/ LRAD  Communication     Cognition     Pain  less than 4  Safety/Judgment  no falls   Therapy Plan: PT Intensity: Minimum of 1-2 x/day ,45 to 90 minutes PT Frequency: 5 out of 7 days PT Duration Estimated Length of Stay: 7-10 days OT Intensity: Minimum of 1-2 x/day, 45 to 90 minutes OT Frequency: 5 out of 7 days OT Duration/Estimated Length of Stay: 7-10 d     Team Interventions: Nursing Interventions Patient/Family Education, Medication Management, Bowel Management, Skin Care/Wound Management, Disease Management/Prevention, Pain Management, Discharge Planning  PT interventions Ambulation/gait training, Community reintegration, Neuromuscular re-education, Stair training, UE/LE Strength taining/ROM, Wheelchair propulsion/positioning, UE/LE Coordination activities, Therapeutic Activities, Pain management,  Discharge planning, Warden/ranger, Functional mobility training, Patient/family education, Therapeutic Exercise  OT Interventions Warden/ranger, Community reintegration, Discharge planning, Cognitive remediation/compensation, Disease mangement/prevention, DME/adaptive equipment instruction, Functional electrical stimulation, Functional mobility training, Neuromuscular re-education, Pain management, Patient/family education, Psychosocial support, Self Care/advanced ADL retraining, Skin care/wound managment, Splinting/orthotics, Therapeutic Activities, Therapeutic Exercise, UE/LE Strength taining/ROM, UE/LE Coordination activities, Visual/perceptual remediation/compensation, Wheelchair propulsion/positioning  SLP Interventions    TR Interventions    SW/CM Interventions Discharge Planning, Psychosocial Support, Patient/Family Education   Barriers to Discharge MD  Medical stability, Home enviroment access/loayout, Wound care, Lack of/limited family support, and Weight bearing restrictions  Nursing Decreased caregiver support, Home environment access/layout, Wound Care home with spouse 1 level with 1 ste no rails  PT Inaccessible home environment STE home w/o rails.  OT Inaccessible home environment, Incontinence step to enter home, tub shower with TTB needs  SLP      SW       Team Discharge Planning: Destination: PT-Home ,OT- Home , SLP-  Projected Follow-up: PT-Home health PT, OT-  Home health OT, Outpatient OT, SLP-  Projected Equipment Needs: PT-To be determined, OT- Tub/shower bench, SLP-  Equipment Details: PT-pt probably already has all needed equipment., OT-TTB- pt has all other DME and 2 reachers Patient/family involved in discharge planning: PT- Patient,  OT-Patient, SLP-   MD ELOS: 7-10 days Medical Rehab Prognosis:  Good Assessment: The patient has been admitted for CIR therapies with the diagnosis of L3 fracture s/p fusion. The team will be addressing  functional mobility, strength, stamina, balance, safety, adaptive techniques and equipment, self-care, bowel and bladder mgt, patient and caregiver education, .  Goals have been set at mod I to supervision/CGA. Anticipated discharge destination is home with husband.        See Team Conference Notes for weekly updates to the plan of care

## 2022-08-22 NOTE — Plan of Care (Signed)
Problem: Consults Goal: RH GENERAL PATIENT EDUCATION Description: See Patient Education module for education specifics. 08/22/2022 1803 by Elgie Congo, LPN Outcome: Progressing 08/22/2022 1803 by Elgie Congo, LPN Outcome: Progressing 08/22/2022 1605 by Elgie Congo, LPN Outcome: Progressing   Problem: RH BOWEL ELIMINATION Goal: RH STG MANAGE BOWEL WITH ASSISTANCE Description: STG Manage Bowel with min Assistance. 08/22/2022 1803 by Elgie Congo, LPN Outcome: Progressing 08/22/2022 1803 by Elgie Congo, LPN Outcome: Progressing 08/22/2022 1605 by Elgie Congo, LPN Outcome: Progressing Goal: RH STG MANAGE BOWEL W/MEDICATION W/ASSISTANCE Description: STG Manage Bowel with Medication with min Assistance. 08/22/2022 1803 by Elgie Congo, LPN Outcome: Progressing 08/22/2022 1803 by Elgie Congo, LPN Outcome: Progressing 08/22/2022 1605 by Elgie Congo, LPN Outcome: Progressing   Problem: RH SKIN INTEGRITY Goal: RH STG ABLE TO PERFORM INCISION/WOUND CARE W/ASSISTANCE Description: STG Able To Perform Incision/Wound Care With min Assistance. 08/22/2022 1803 by Elgie Congo, LPN Outcome: Progressing 08/22/2022 1803 by Elgie Congo, LPN Outcome: Progressing 08/22/2022 1605 by Elgie Congo, LPN Outcome: Progressing   Problem: RH SAFETY Goal: RH STG ADHERE TO SAFETY PRECAUTIONS W/ASSISTANCE/DEVICE Description: STG Adhere to Safety Precautions With min Assistance/Device. 08/22/2022 1803 by Elgie Congo, LPN Outcome: Progressing 08/22/2022 1803 by Elgie Congo, LPN Outcome: Progressing 08/22/2022 1605 by Elgie Congo, LPN Outcome: Progressing Goal: RH STG DECREASED RISK OF FALL WITH ASSISTANCE Description: STG Decreased Risk of Fall With cueing Assistance. 08/22/2022 1803 by Elgie Congo, LPN Outcome: Progressing 08/22/2022 1803 by Elgie Congo, LPN Outcome: Progressing 08/22/2022 1605 by Elgie Congo, LPN Outcome: Progressing    Problem: RH PAIN MANAGEMENT Goal: RH STG PAIN MANAGED AT OR BELOW PT'S PAIN GOAL Description: Pain will be managed 4 out of 10 on pain scale with PRN medications min assist  08/22/2022 1803 by Elgie Congo, LPN Outcome: Progressing 08/22/2022 1803 by Elgie Congo, LPN Outcome: Progressing 08/22/2022 1605 by Elgie Congo, LPN Outcome: Progressing   Problem: RH KNOWLEDGE DEFICIT GENERAL Goal: RH STG INCREASE KNOWLEDGE OF SELF CARE AFTER HOSPITALIZATION Description: Patient/caregiver will be able to manage medications, wound care, and self care from nursing education, nursing handouts, and other sources independently  08/22/2022 1803 by Elgie Congo, LPN Outcome: Progressing 08/22/2022 1803 by Elgie Congo, LPN Outcome: Progressing 08/22/2022 1605 by Tilden Dome A, LPN Outcome: Progressing   Problem: Education: Goal: Ability to verbalize activity precautions or restrictions will improve 08/22/2022 1803 by Elgie Congo, LPN Outcome: Progressing 08/22/2022 1803 by Elgie Congo, LPN Outcome: Progressing 08/22/2022 1605 by Tilden Dome A, LPN Outcome: Progressing Goal: Knowledge of the prescribed therapeutic regimen will improve 08/22/2022 1803 by Elgie Congo, LPN Outcome: Progressing 08/22/2022 1803 by Elgie Congo, LPN Outcome: Progressing 08/22/2022 1605 by Tilden Dome A, LPN Outcome: Progressing Goal: Understanding of discharge needs will improve 08/22/2022 1803 by Elgie Congo, LPN Outcome: Progressing 08/22/2022 1803 by Elgie Congo, LPN Outcome: Progressing 08/22/2022 1605 by Elgie Congo, LPN Outcome: Progressing   Problem: Activity: Goal: Ability to avoid complications of mobility impairment will improve 08/22/2022 1803 by Elgie Congo, LPN Outcome: Progressing 08/22/2022 1803 by Elgie Congo, LPN Outcome: Progressing 08/22/2022 1605 by Tilden Dome A, LPN Outcome: Progressing Goal: Ability to tolerate increased activity will  improve 08/22/2022 1803 by Elgie Congo, LPN Outcome: Progressing 08/22/2022 1803 by Elgie Congo, LPN Outcome: Progressing 08/22/2022 1605 by Elgie Congo, LPN Outcome: Progressing Goal: Will remain free  from falls 08/22/2022 1803 by Elgie Congo, LPN Outcome: Progressing 08/22/2022 1803 by Elgie Congo, LPN Outcome: Progressing 08/22/2022 1605 by Elgie Congo, LPN Outcome: Progressing   Problem: Bowel/Gastric: Goal: Gastrointestinal status for postoperative course will improve 08/22/2022 1803 by Elgie Congo, LPN Outcome: Progressing 08/22/2022 1803 by Elgie Congo, LPN Outcome: Progressing 08/22/2022 1605 by Tilden Dome A, LPN Outcome: Progressing   Problem: Clinical Measurements: Goal: Ability to maintain clinical measurements within normal limits will improve 08/22/2022 1803 by Elgie Congo, LPN Outcome: Progressing 08/22/2022 1803 by Elgie Congo, LPN Outcome: Progressing 08/22/2022 1605 by Tilden Dome A, LPN Outcome: Progressing Goal: Postoperative complications will be avoided or minimized 08/22/2022 1803 by Elgie Congo, LPN Outcome: Progressing 08/22/2022 1803 by Elgie Congo, LPN Outcome: Progressing 08/22/2022 1605 by Tilden Dome A, LPN Outcome: Progressing Goal: Diagnostic test results will improve 08/22/2022 1803 by Elgie Congo, LPN Outcome: Progressing 08/22/2022 1803 by Elgie Congo, LPN Outcome: Progressing 08/22/2022 1605 by Tilden Dome A, LPN Outcome: Progressing   Problem: Pain Management: Goal: Pain level will decrease 08/22/2022 1803 by Elgie Congo, LPN Outcome: Progressing 08/22/2022 1803 by Elgie Congo, LPN Outcome: Progressing 08/22/2022 1605 by Tilden Dome A, LPN Outcome: Progressing   Problem: Skin Integrity: Goal: Will show signs of wound healing 08/22/2022 1803 by Elgie Congo, LPN Outcome: Progressing 08/22/2022 1803 by Elgie Congo, LPN Outcome: Progressing 08/22/2022 1605 by  Elgie Congo, LPN Outcome: Progressing   Problem: Health Behavior/Discharge Planning: Goal: Identification of resources available to assist in meeting health care needs will improve 08/22/2022 1803 by Elgie Congo, LPN Outcome: Progressing 08/22/2022 1803 by Elgie Congo, LPN Outcome: Progressing 08/22/2022 1605 by Tilden Dome A, LPN Outcome: Progressing   Problem: Bladder/Genitourinary: Goal: Urinary functional status for postoperative course will improve 08/22/2022 1803 by Elgie Congo, LPN Outcome: Progressing 08/22/2022 1803 by Elgie Congo, LPN Outcome: Progressing 08/22/2022 1605 by Elgie Congo, LPN Outcome: Progressing

## 2022-08-22 NOTE — Progress Notes (Signed)
PROGRESS NOTE   Subjective/Complaints:  Pt reports back bothers her ~ 5/10 unless puts pillow behind back then down to 2/10 at rest.   LBM this AM. Miralax working well.   ROS:   Pt denies SOB, abd pain, CP, N/V/C/D, and vision changes  Except for HPI  Objective:   No results found. Recent Labs    08/22/22 0632  WBC 5.1  HGB 9.0*  HCT 28.4*  PLT 194    No results for input(s): "NA", "K", "CL", "CO2", "GLUCOSE", "BUN", "CREATININE", "CALCIUM" in the last 72 hours.  Intake/Output Summary (Last 24 hours) at 08/22/2022 0830 Last data filed at 08/22/2022 0721 Gross per 24 hour  Intake 1080 ml  Output --  Net 1080 ml        Physical Exam: Vital Signs Blood pressure (!) 154/67, pulse 86, temperature 98 F (36.7 C), resp. rate 16, height 5' (1.524 m), weight 51.9 kg, SpO2 99%.    General: awake, alert, appropriate, slightly frail appearing; supine in bed;  NAD HENT: conjugate gaze; oropharynx moist CV: regular rate and rhythm; no JVD Pulmonary: CTA B/L; no W/R/R- good air movement GI: soft, NT, ND, (+)BS- normoactive Psychiatric: appropriate- interactive Neurological: Ox3  Ext: no clubbing, cyanosis, or edema Psych: pleasant and cooperative  Skin: back incision with honeycomb dressing--no change in area where there's old blood at superior aspect of incision. Neuro:  Alert and oriented x 3. Normal insight and awareness. Intact Memory. Normal language and speech. Cranial nerve exam unremarkable. MMT: UE 5/5. LE 3 to 3+ HF, KE and 4- to 4/5 ADF/PF. Decreased LT below knees and in fingers. DTR's tr to 1+. .   Musculoskeletal:  LB TTP and with bed mobility   Assessment/Plan: 1. Functional deficits which require 3+ hours per day of interdisciplinary therapy in a comprehensive inpatient rehab setting. Physiatrist is providing close team supervision and 24 hour management of active medical problems listed  below. Physiatrist and rehab team continue to assess barriers to discharge/monitor patient progress toward functional and medical goals  Care Tool:  Bathing    Body parts bathed by patient: Right arm, Left arm, Chest, Abdomen, Front perineal area, Right upper leg, Left upper leg, Face   Body parts bathed by helper: Right lower leg, Left lower leg, Buttocks     Bathing assist Assist Level: Moderate Assistance - Patient 50 - 74%     Upper Body Dressing/Undressing Upper body dressing   What is the patient wearing?: Pull over shirt, Orthosis    Upper body assist Assist Level: Minimal Assistance - Patient > 75%    Lower Body Dressing/Undressing Lower body dressing      What is the patient wearing?: Underwear/pull up, Pants     Lower body assist Assist for lower body dressing: Moderate Assistance - Patient 50 - 74%     Toileting Toileting    Toileting assist Assist for toileting: Minimal Assistance - Patient > 75%     Transfers Chair/bed transfer  Transfers assist     Chair/bed transfer assist level: Minimal Assistance - Patient > 75%     Locomotion Ambulation   Ambulation assist      Assist level: Minimal  Assistance - Patient > 75% Assistive device: Walker-rolling Max distance: 150   Walk 10 feet activity   Assist     Assist level: Minimal Assistance - Patient > 75% Assistive device: Walker-rolling   Walk 50 feet activity   Assist    Assist level: Minimal Assistance - Patient > 75% Assistive device: Walker-rolling    Walk 150 feet activity   Assist    Assist level: Minimal Assistance - Patient > 75% Assistive device: Walker-rolling    Walk 10 feet on uneven surface  activity   Assist Walk 10 feet on uneven surfaces activity did not occur: Safety/medical concerns         Wheelchair     Assist Is the patient using a wheelchair?: Yes Type of Wheelchair: Manual    Wheelchair assist level: Supervision/Verbal cueing Max  wheelchair distance: 150    Wheelchair 50 feet with 2 turns activity    Assist        Assist Level: Supervision/Verbal cueing   Wheelchair 150 feet activity     Assist      Assist Level: Supervision/Verbal cueing   Blood pressure (!) 154/67, pulse 86, temperature 98 F (36.7 C), resp. rate 16, height 5' (1.524 m), weight 51.9 kg, SpO2 99%.  Medical Problem List and Plan: 1. Functional deficits secondary to L3 fx s/p L2-L4 fixation and removal of previous hardware.              -hx also significant for relaxing-remitting MS             -patient may shower             -ELOS/Goals: 7-10 days, mod I to supervision goals with PT and OT   Con't CIR PT and OT- and needs vestibular eval 2.  Antithrombotics: -DVT/anticoagulation:  Mechanical:  Antiembolism stockings, knee (TED hose) Bilateral lower extremities>>pt walking 120' with therapies and bruises A LOT. Will stay with TEDS and activity OOB  -pt wants to avoid blood thinners             -antiplatelet therapy: none   3. Pain Management/FMS/peripheral neuropathy/RLS: Tylenol, Norco, Zanaflex as needed             -continue gabapentin 900 mg q HS             -have discussed meds for RLS with pt. She pays $100 a month for horizant which she takes on top of gabapentin. She hasn't noticed a big change since she's been without while in hospital. Is interested in stopping it. I told her that we could add 300mg  of gabapentin in the am or gabapentin 300mg  bid in horizant's place. May do just fine with the 900mg  gabapentin at bedtime.  7/15- pt says [ain ok this AM- 5/10 at worst 2/10 at rest/right now 4. Mood/Behavior/Sleep: LCSW to evaluate and provide emotional support             -antipsychotic agents: n/a             -continue Cymbalta 60 mg daily             -clonazepam 0.5 mg prn anxiety             -hx of migraine HA>>on erenumab injection monthly, Nurtec as needed   5. Neuropsych/cognition: This patient is capable of  making decisions on her own behalf.   6. Skin/Wound Care: Routine skin care checks             -  observe surgical incision for increased drainage 7. Fluids/Electrolytes/Nutrition: Routine Is and Os and follow-up chemistries on Monday             -continue acidophilus, vitamin C, B-complex, D3   8: Hypotension: monitor TID and prn             -continue Florinef   7/15- BP 150s systolic this AM- laying down- will monitor for trend 9: Anemia: continue oral iron and follow-up CBC   10: IBS: continue acidophilus and dicyclomine             -Linzess as needed for constipation--pt doesn't like to use because it causes urgent blowouts. Holding for now. Prefers to use miralax                         -continue senokot-s and miralax scheduled                         -have miralax prn available also   -last bm 7/13 without any extra medication   7/15- LBM this AM- likes miralax.    12. Neurogenic bladder d/t MS             -tends to retain             -first PVR 488 this morning             -continue uroxatral             -oob to void             -uses keflex every other day as prophylaxis against UTI's   7/15- stable- no issues right now, per pt.  13. Addison's disease             -resumed medrol:  4mg  am and 2mg  pm daily    I spent a total of  36  minutes on total care today- >50% coordination of care- due to  D/w nursing to remove IV- also IPOC done and d/w nursing.     LOS: 3 days A FACE TO FACE EVALUATION WAS PERFORMED  Katrina Rivera 08/22/2022, 8:30 AM

## 2022-08-22 NOTE — Progress Notes (Addendum)
Notified Dan Angiulli, of elevated blood pressure. Continue to monitor patient, no new orders at this time.     Tilden Dome, LPN

## 2022-08-22 NOTE — Progress Notes (Signed)
 Inpatient Rehabilitation  Patient information reviewed and entered into eRehab system by Melissa M. Bowie, M.A., CCC/SLP, PPS Coordinator.  Information including medical coding, functional ability and quality indicators will be reviewed and updated through discharge.    

## 2022-08-23 DIAGNOSIS — S32001A Stable burst fracture of unspecified lumbar vertebra, initial encounter for closed fracture: Secondary | ICD-10-CM | POA: Diagnosis not present

## 2022-08-23 MED ORDER — SENNOSIDES-DOCUSATE SODIUM 8.6-50 MG PO TABS
1.0000 | ORAL_TABLET | Freq: Every day | ORAL | Status: DC
  Administered 2022-08-23 – 2022-08-28 (×6): 1 via ORAL
  Filled 2022-08-23 (×6): qty 1

## 2022-08-23 MED ORDER — POLYETHYLENE GLYCOL 3350 17 G PO PACK
17.0000 g | PACK | ORAL | Status: DC
  Administered 2022-08-25: 17 g via ORAL
  Filled 2022-08-23: qty 1

## 2022-08-23 MED ORDER — GABAPENTIN 300 MG PO CAPS
300.0000 mg | ORAL_CAPSULE | Freq: Every day | ORAL | Status: DC
  Administered 2022-08-23 – 2022-08-29 (×7): 300 mg via ORAL
  Filled 2022-08-23 (×7): qty 1

## 2022-08-23 NOTE — Progress Notes (Signed)
Occupational Therapy Session Note  Patient Details  Name: Katrina Rivera MRN: [id number] Date of Birth: 1945-02-11  Today's Date: 08/23/2022 OT Individual Time: 1445-1530 OT Individual Time Calculation (min): 45 min    Short Term Goals: Week 1:  OT Short Term Goal 1 (Week 1): STG=LTG's based on LOS  Skilled Therapeutic Interventions/Progress Updates:      Therapy Documentation Precautions:  Precautions Precautions: Fall, Back Precaution Booklet Issued: No Precaution Comments: recalled 2/3 from prior surgeries, pt with 4 falls since last surgery Required Braces or Orthoses: Spinal Brace Spinal Brace: Applied in sitting position, Lumbar corset Restrictions Weight Bearing Restrictions: No General: "Hi there!" Pt seated in W/C upon OT arrival, agreeable to OT. Husband present for session.  Pain: No pain reported/noted. ADL: Bed mobility: SBA EOB>supine Transfers: CGA stand pivot W/C>EOB   Exercises: Pt issued UE theraband HEP in order to increase functional strength, andurance and activity tolerance in order to increase independence in ADLs such as bathing. Pt issued yellow and red theraband and discussed direction/technique of exercises, demonstrating verbal understanding. Pt completed 3x10 exercises at seated level listed below: -elbow extensions - shoulder horizontal abduction -bicep curls  -shoulder flexion -diagonal shoulder flexion -external rotation  Pt with VC for direction to task and correct form.  Pt supine in bed with bed alarm activated, 2 bed rails up, call light within reach and 4Ps assessed. Husband present.   Therapy/Group: Individual Therapy  Velia Meyer, OTD, OTR/L 08/23/2022, 4:11 PM

## 2022-08-23 NOTE — Progress Notes (Signed)
Patient ID: Katrina Rivera, female   DOB: 06-02-1945, 77 y.o.   MRN: [id number]  Met with pt to give team conference update regarding goals of supervision-mod/I level and target discharge date of 7/22. She feels like she is doing better but is aware of her BP issues. MD is working on this issue. She has most all equipment, made aware tub bench is not covered yb insurance she will get on-line. Will continue to work on discharge needs.

## 2022-08-23 NOTE — Progress Notes (Signed)
Physical Therapy Session Note  Patient Details  Name: Katrina Rivera MRN: [id number] Date of Birth: May 06, 1945  Today's Date: 08/23/2022 PT Individual Time: 1015-1125 PT Individual Time Calculation (min): 70 min   Short Term Goals: Week 1:  PT Short Term Goal 1 (Week 1): STG=LTG 2/2 ELOS.  Skilled Therapeutic Interventions/Progress Updates:    Chart reviewed and pt agreeable to therapy. Pt received seated in WC with no c/o pain. Session focused on amb endurance and stair training to promote safe home access. Pt initiated session with 25 mins of amb for >1064ft using S + RW around multiple hospital floors. Pt then completed amb around gift shop with S + Rw demonstrating good navigation of tight spaces and community spaces. PT and pt discussed continuum of care and OP PT during seated rest break with pt confirming understanding of importance of continued PT. Pt also completed 1x10 step ups on stairs with B rail + S. Pt then completed 3 step ups on 8" curb step with LBQC on L side + MinA on R side as HHA to explore options for navigating large step into home. Pt also considering rails. Pt amb to return to room with S + RW. At end of session, pt was left seated in [facility] with alarm engaged, nurse call bell and all needs in reach.     Therapy Documentation Precautions:  Precautions Precautions: Fall, Back Precaution Booklet Issued: No Precaution Comments: recalled 2/3 from prior surgeries, pt with 4 falls since last surgery Required Braces or Orthoses: Spinal Brace Spinal Brace: Applied in sitting position, Lumbar corset Restrictions Weight Bearing Restrictions: No General:      Therapy/Group: Individual Therapy  Dionne Milo, PT, DPT 08/23/2022, 12:41 PM

## 2022-08-23 NOTE — Plan of Care (Signed)
  Problem: Consults Goal: RH GENERAL PATIENT EDUCATION Description: See Patient Education module for education specifics. Outcome: Progressing   Problem: RH BOWEL ELIMINATION Goal: RH STG MANAGE BOWEL WITH ASSISTANCE Description: STG Manage Bowel with min Assistance. Outcome: Progressing Goal: RH STG MANAGE BOWEL W/MEDICATION W/ASSISTANCE Description: STG Manage Bowel with Medication with min Assistance. Outcome: Progressing   Problem: RH SKIN INTEGRITY Goal: RH STG ABLE TO PERFORM INCISION/WOUND CARE W/ASSISTANCE Description: STG Able To Perform Incision/Wound Care With min Assistance. Outcome: Progressing   Problem: RH SAFETY Goal: RH STG ADHERE TO SAFETY PRECAUTIONS W/ASSISTANCE/DEVICE Description: STG Adhere to Safety Precautions With min Assistance/Device. Outcome: Progressing Goal: RH STG DECREASED RISK OF FALL WITH ASSISTANCE Description: STG Decreased Risk of Fall With cueing Assistance. Outcome: Progressing   Problem: RH PAIN MANAGEMENT Goal: RH STG PAIN MANAGED AT OR BELOW PT'S PAIN GOAL Description: Pain will be managed 4 out of 10 on pain scale with PRN medications min assist  Outcome: Progressing   Problem: RH KNOWLEDGE DEFICIT GENERAL Goal: RH STG INCREASE KNOWLEDGE OF SELF CARE AFTER HOSPITALIZATION Description: Patient/caregiver will be able to manage medications, wound care, and self care from nursing education, nursing handouts, and other sources independently  Outcome: Progressing   Problem: Education: Goal: Ability to verbalize activity precautions or restrictions will improve Outcome: Progressing Goal: Knowledge of the prescribed therapeutic regimen will improve Outcome: Progressing Goal: Understanding of discharge needs will improve Outcome: Progressing   Problem: Activity: Goal: Ability to avoid complications of mobility impairment will improve Outcome: Progressing Goal: Ability to tolerate increased activity will improve Outcome:  Progressing Goal: Will remain free from falls Outcome: Progressing   Problem: Bowel/Gastric: Goal: Gastrointestinal status for postoperative course will improve Outcome: Progressing   Problem: Clinical Measurements: Goal: Ability to maintain clinical measurements within normal limits will improve Outcome: Progressing Goal: Postoperative complications will be avoided or minimized Outcome: Progressing Goal: Diagnostic test results will improve Outcome: Progressing   Problem: Pain Management: Goal: Pain level will decrease Outcome: Progressing   Problem: Skin Integrity: Goal: Will show signs of wound healing Outcome: Progressing   Problem: Health Behavior/Discharge Planning: Goal: Identification of resources available to assist in meeting health care needs will improve Outcome: Progressing   Problem: Bladder/Genitourinary: Goal: Urinary functional status for postoperative course will improve Outcome: Progressing

## 2022-08-23 NOTE — Progress Notes (Signed)
PROGRESS NOTE   Subjective/Complaints:  Having neck pain- causing some HA's.  BP got "too high" yesterday- thought due to Florinef- which she received late.  Rough night due to increased back pain.  Meds helped some, but not enough.   ROS:    Pt denies SOB, abd pain, CP, N/V/C/ (+)D, and vision changes   Except for HPI  Objective:   No results found. Recent Labs    08/22/22 0632  WBC 5.1  HGB 9.0*  HCT 28.4*  PLT 194    Recent Labs    08/22/22 0632  NA 138  K 3.6  CL 107  CO2 25  GLUCOSE 86  BUN 16  CREATININE 0.73  CALCIUM 8.9    Intake/Output Summary (Last 24 hours) at 08/23/2022 0902 Last data filed at 08/23/2022 0729 Gross per 24 hour  Intake 1080 ml  Output --  Net 1080 ml        Physical Exam: Vital Signs Blood pressure (!) 157/70, pulse 85, temperature 98 F (36.7 C), resp. rate 16, height 5' (1.524 m), weight 51.9 kg, SpO2 100%.     General: awake, alert, appropriate, sitting up in bed; frail appearing; NAD HENT: conjugate gaze; oropharynx moist- tight Scalenes, levators and upper traps CV: regular rate and rhythm; no JVD Pulmonary: CTA B/L; no W/R/R- good air movement GI: soft, NT, ND, (+)BS- slightly hyperactive Psychiatric: appropriate Neurological: Ox3  Ext: no clubbing, cyanosis, or edema Psych: pleasant and cooperative  Skin: back incision with honeycomb dressing--no change in area where there's old blood at superior aspect of incision. Neuro:  Alert and oriented x 3. Normal insight and awareness. Intact Memory. Normal language and speech. Cranial nerve exam unremarkable. MMT: UE 5/5. LE 3 to 3+ HF, KE and 4- to 4/5 ADF/PF. Decreased LT below knees and in fingers. DTR's tr to 1+. .   Musculoskeletal:  LB TTP and with bed mobility   Assessment/Plan: 1. Functional deficits which require 3+ hours per day of interdisciplinary therapy in a comprehensive inpatient rehab  setting. Physiatrist is providing close team supervision and 24 hour management of active medical problems listed below. Physiatrist and rehab team continue to assess barriers to discharge/monitor patient progress toward functional and medical goals  Care Tool:  Bathing    Body parts bathed by patient: Right arm, Left arm, Chest, Abdomen, Front perineal area, Right upper leg, Left upper leg, Face   Body parts bathed by helper: Right lower leg, Left lower leg, Buttocks     Bathing assist Assist Level: Moderate Assistance - Patient 50 - 74%     Upper Body Dressing/Undressing Upper body dressing   What is the patient wearing?: Pull over shirt, Orthosis    Upper body assist Assist Level: Minimal Assistance - Patient > 75%    Lower Body Dressing/Undressing Lower body dressing      What is the patient wearing?: Underwear/pull up, Pants     Lower body assist Assist for lower body dressing: Moderate Assistance - Patient 50 - 74%     Toileting Toileting    Toileting assist Assist for toileting: Minimal Assistance - Patient > 75%     Transfers Chair/bed transfer  Transfers assist     Chair/bed transfer assist level: Minimal Assistance - Patient > 75%     Locomotion Ambulation   Ambulation assist      Assist level: Minimal Assistance - Patient > 75% Assistive device: Walker-rolling Max distance: 150   Walk 10 feet activity   Assist     Assist level: Minimal Assistance - Patient > 75% Assistive device: Walker-rolling   Walk 50 feet activity   Assist    Assist level: Minimal Assistance - Patient > 75% Assistive device: Walker-rolling    Walk 150 feet activity   Assist    Assist level: Minimal Assistance - Patient > 75% Assistive device: Walker-rolling    Walk 10 feet on uneven surface  activity   Assist Walk 10 feet on uneven surfaces activity did not occur: Safety/medical concerns         Wheelchair     Assist Is the patient  using a wheelchair?: Yes Type of Wheelchair: Manual    Wheelchair assist level: Supervision/Verbal cueing Max wheelchair distance: 150    Wheelchair 50 feet with 2 turns activity    Assist        Assist Level: Supervision/Verbal cueing   Wheelchair 150 feet activity     Assist      Assist Level: Supervision/Verbal cueing   Blood pressure (!) 157/70, pulse 85, temperature 98 F (36.7 C), resp. rate 16, height 5' (1.524 m), weight 51.9 kg, SpO2 100%.  Medical Problem List and Plan: 1. Functional deficits secondary to L3 fx s/p L2-L4 fixation and removal of previous hardware.              -hx also significant for relaxing-remitting MS             -patient may shower             -ELOS/Goals: 7-10 days, mod I to supervision goals with PT and OT   Con't CIR PT and OT Team conference today to determine length of stay  -Trp injections Thursday 2.  Antithrombotics: -DVT/anticoagulation:  Mechanical:  Antiembolism stockings, knee (TED hose) Bilateral lower extremities>>pt walking 120' with therapies and bruises A LOT. Will stay with TEDS and activity OOB  -pt wants to avoid blood thinners             -antiplatelet therapy: none   3. Pain Management/FMS/peripheral neuropathy/RLS: Tylenol, Norco, Zanaflex as needed             -continue gabapentin 900 mg q HS             -have discussed meds for RLS with pt. She pays $100 a month for horizant which she takes on top of gabapentin. She hasn't noticed a big change since she's been without while in hospital. Is interested in stopping it. I told her that we could add 300mg  of gabapentin in the am or gabapentin 300mg  bid in horizant's place. May do just fine with the 900mg  gabapentin at bedtime.  7/15- pt says pain ok this AM- 5/10 at worst 2/10 at rest/right now  7/16- more pain last night- therapy overdid it?- cont' regimen- will add Gabapentin 300 mg qam- pain in neck - trP- will do injections Thursday 4. Mood/Behavior/Sleep: LCSW  to evaluate and provide emotional support             -antipsychotic agents: n/a             -continue Cymbalta 60 mg daily             -  clonazepam 0.5 mg prn anxiety             -hx of migraine HA>>on erenumab injection monthly, Nurtec as needed   5. Neuropsych/cognition: This patient is capable of making decisions on her own behalf.   6. Skin/Wound Care: Routine skin care checks             -observe surgical incision for increased drainage 7. Fluids/Electrolytes/Nutrition: Routine Is and Os and follow-up chemistries on Monday             -continue acidophilus, vitamin C, B-complex, D3   8: Hypotension: monitor TID and prn             -continue Florinef   7/15- BP 150s systolic this AM- laying down- will monitor for trend  7/16- BP elevated this AM- thinks due to receiving Flornief late- explained usually Florinef treats LOW BP.  9: Anemia: continue oral iron and follow-up CBC   10: IBS: continue acidophilus and dicyclomine             -Linzess as needed for constipation--pt doesn't like to use because it causes urgent blowouts. Holding for now. Prefers to use miralax                         -continue senokot-s and miralax scheduled                         -have miralax prn available also   -last bm 7/13 without any extra medication   7/15- LBM this AM- likes miralax.    7/16- LBM yesterday but very loose/almost liquid-wil reduce Senna to 1 tab/day and change miralax to every other day-  12. Neurogenic bladder d/t MS             -tends to retain             -first PVR 488 this morning             -continue uroxatral             -oob to void             -uses keflex every other day as prophylaxis against UTI's   7/15- stable- no issues right now, per pt.  13. Addison's disease             -resumed medrol:  4mg  am and 2mg  pm daily    I spent a total of  54  minutes on total care today- >50% coordination of care- due to  D/w pt about nerve pain/restless leg syndrome; bowels as well  as team conference to determine length of stay LOS: 4 days A FACE TO FACE EVALUATION WAS PERFORMED  Talisha Erby 08/23/2022, 9:02 AM

## 2022-08-23 NOTE — Progress Notes (Signed)
Physical Therapy Session Note  Patient Details  Name: Katrina Rivera MRN: [id number] Date of Birth: Sep 02, 1945  Today's Date: 08/23/2022 PT Individual Time: 0900-0930, [id number]  PT Individual Time Calculation (min): 30 min , 54 min   Short Term Goals: Week 1:  PT Short Term Goal 1 (Week 1): STG=LTG 2/2 ELOS.  Skilled Therapeutic Interventions/Progress Updates:      Therapy Documentation Precautions:  Precautions Precautions: Fall, Back Precaution Booklet Issued: No Precaution Comments: recalled 2/3 from prior surgeries, pt with 4 falls since last surgery Required Braces or Orthoses: Spinal Brace Spinal Brace: Applied in sitting position, Lumbar corset Restrictions Weight Bearing Restrictions: No   Treatment Session 1:   Pt received seated in w/c at bedside and reports neck pain and stiffness, provided manual therapy by soft tissue mobilization and trigger point therapy to bilateral upper traps and SCM. Pt instructed on upper trap flexibility exercises bilaterally x 20 seconds. Pt left seated in w/c at bedside with all needs in reach and alarm on.    Treatment Session 2:   Pt received toileting with NT and PT present for handoff. Pt without reports of pain in session. Pt continent of bowel, nurse notified.   Pt (S) with RW with ambulatory transfer to sink and for dynamic standing balance for hand hygiene. Pt transitioned to gait training without AD and presents with crouch gait with bilateral knee flexion. Pt able to attain upright position with tactile cueing and ambulated ~50 ft prior to feeling faint, dizzy and lightheaded. Pt reported it felt like "walking in quick sand" and vitals assessed:   BP: 136/56 (80), Pulse 110  Pt transported to gym and requested orthostatic vitals assessed and BP in standing recorded as 100/56 (68) and pulse of 112.   Pt engaged in discussion with pt regarding MS and encouraged pt to utilize virtual or in person support groups, pt receptive.    Pt performed following LE's with bilateral 1.5# ankle weights to address strength deficits with gait:   -marches 2 x 10   -LAQ 2 x 10 with 3 second hold and focus on eccentric control with knee flexion   Pt transported to room and left seated in w/c at bedside with all needs in reach and alarm on. BP 123/68 and pulse 93.   Therapy/Group: Individual Therapy  Truitt Leep Truitt Leep PT, DPT  08/23/2022, 5:50 AM

## 2022-08-23 NOTE — Patient Care Conference (Signed)
Inpatient RehabilitationTeam Conference and Plan of Care Update Date: 08/23/2022   Time: 11:51 AM    Patient Name: Katrina Rivera      Medical Record Number: [id number]  Date of Birth: February 28, 1945 Sex: Female         Room/Bed: 4W21C/4W21C-01 Payor Info: Payor: [employer] / Plan: [employer] PPO / Product Type: *No Product type* /    Admit Date/Time:  08/19/2022  7:58 PM  Primary Diagnosis:  Lumbar burst fracture [facility])  Hospital Problems: Principal Problem:   Lumbar burst fracture [facility])    Expected Discharge Date: Expected Discharge Date: 08/29/22  Team Members Present: Physician leading conference: Dr. [name] Social Worker Present: Dossie Der, LCSW Nurse Present: Vedia Pereyra, RN PT Present: Truitt Leep, PT OT Present: Velia Meyer, OT PPS Coordinator present : Fae Pippin, SLP     Current Status/Progress Goal Weekly Team Focus  Bowel/Bladder   Continent of bowel and bladder; LBM 08/22/22.   Remain continent of bowel and bladder.   Assess bowel and bladder needs q 2 hours while awake and offer toilieting as needed.    Swallow/Nutrition/ Hydration               ADL's   CGA-SBA for functional transfers with RW, Mod A LB ADLs, SBA UB ADLs   Mod I UB ADLs, grooming mod I and dynamic standing balance with RW, supervision for functional transfers and bathing   general strength and endurance training, full ADL, D/C planning, AE retraining    Mobility   supervision bed, transfers, gait > 300 ft and CGA curb step with RW   Mod I  fall recovery    Communication                Safety/Cognition/ Behavioral Observations               Pain   Patient denies pain at this time.   Patient rates pain < or equal to 2/10.   Assess pain q shift and PRN.    Skin   Surgical incision to back- honeycomb dressing in place.   Assess skin q shift and PRN.  Remain free from skin breakdown.      Discharge Planning:  Home with husband  who is able to assist. Pt doing well in her therapies and will be short lenght of stay. [facility] already following from acute   Team Discussion: Lumbar burst fracture. Hx of MS. Baseline incontinent of bowel (loose stool). Provided anti-inflammatory diet. Has difficulty with starting urine flow.  Back brace OOB. Will do trigger point injection Thursday. Needs vertigo handouts. Working on AE for increased independence and vestibular evaluation.  Patient on target to meet rehab goals: yes, progressing towards goals with discharge date of 08/29/22  *See Care Plan and progress notes for long and short-term goals.   Revisions to Treatment Plan:  Change Senna to every other day. Gabapentin increased.  Monitor labs/VS Teaching Needs: Medications, safety, self care, gait/transfer training, skin/wound care, etc.   Current Barriers to Discharge: Decreased caregiver support and Wound care  Possible Resolutions to Barriers: Family education Independent with incision care Order recommended DME     Medical Summary Current Status: liquid stool due to laxatives- incontnent with stools- part of bowel resection in past- continent with urine- difficulty starting stream- HAs' are an issue - incision minimal drainage- wears corset brace  Barriers to Discharge: Medical stability;Uncontrolled Pain;Self-care education;Weight bearing restrictions;Neurogenic Bowel & Bladder;Uncontrolled Hypertension  Barriers to Discharge Comments:  hx of hypotension, but BP running high- needs vesitbular eval- some done- central related vestibular-could be due to trigger points? vs MS central related Possible Resolutions to [employer] Focus: do trigger point injectiosn Thursday- added gabapentin for restless leg syndrome; and decrease bowel meds- d/c- 7/22-   Continued Need for Acute Rehabilitation Level of Care: The patient requires daily medical management by a physician with specialized training in physical medicine and  rehabilitation for the following reasons: Direction of a multidisciplinary physical rehabilitation program to maximize functional independence : Yes Medical management of patient stability for increased activity during participation in an intensive rehabilitation regime.: Yes Analysis of laboratory values and/or radiology reports with any subsequent need for medication adjustment and/or medical intervention. : Yes   I attest that I was present, lead the team conference, and concur with the assessment and plan of the team.   Jearld Adjutant 08/23/2022, 2:46 PM

## 2022-08-23 NOTE — Discharge Summary (Signed)
Physician Discharge Summary  Patient ID: Katrina Rivera MRN: [id number] DOB/AGE: 75-03-1945 75 y.o.  Admit date: 08/19/2022 Discharge date: 08/29/2022  Discharge Diagnoses:  Principal Problem:   Lumbar burst fracture [facility]) Active problems: Functional deficits secondary to L3 fx s/p L2-L4 fixation and removal of previous hardware.  RLS Hypotension Anemia IBS Neurogenic bladder Addison's disease   Discharged Condition: good  Significant Diagnostic Studies: CT LUMBAR SPINE WO CONTRAST  Result Date: 08/21/2022 CLINICAL DATA:  77 year old female status post lumbar surgery, most recently L3-L4 decompression and fusion. Fall. Pain. EXAM: CT LUMBAR SPINE WITHOUT CONTRAST TECHNIQUE: Multidetector CT imaging of the lumbar spine was performed without intravenous contrast administration. Multiplanar CT image reconstructions were also generated. RADIATION DOSE REDUCTION: This exam was performed according to the departmental dose-optimization program which includes automated exposure control, adjustment of the mA and/or kV according to patient size and/or use of iterative reconstruction technique. COMPARISON:  Lumbar MRI 07/30/2022.  CT lumbar myelogram 05/24/2022. FINDINGS: Segmentation: Normal, the same numbering system used previously. Alignment: Levoconvex lumbar scoliosis with apex at L3-L4 has not significantly changed since April. Largely stable lumbar lordosis since that time, although deformity of the posterosuperior L3 endplate is associated with increased kyphosis at L2-L3 similar to the MRI last month. Vertebrae: Some generalized osteopenia. Postoperative details are below. T12, L1, and L2 levels appear intact. Un healed roughly 10 mm fracture fragment of the posterosuperior L3 body (series 7, image 38) as suspected on MRI. Mild retropulsion of that fragment (3-4 mm) not resulting in significant bony narrowing of the spinal canal there. The fracture does extend to the course of both L3 pedicle  screws (e.g. Series 7, image 30 on the right). And furthermore, the bilateral pedicles also appear avulsed along the course of both screws as seen on series 7 images 30 and 44. The metal hardware remains intact. L4 and L5 levels appear stable since April, including chronic left L4 pedicle fracture and sclerosis (series 7, image 44), some endplate subsidence. Visible sacrum and SI joints appear intact. Paraspinal and other soft tissues: Right flank generator device with electrical leads extending cephalad. Aortoiliac calcified atherosclerosis. Normal caliber abdominal aorta. Right nephrolithiasis. Cholecystectomy. Retained stool in the colon. Postoperative changes to the lumbar paraspinal soft tissues. Disc levels: T12-L1:  Stable from 07/30/2022, negative. L1-L2: Stable right paracentral disc protrusion since the MRI last month on series 5, image 27. L2-L3: Mild retropulsion of the posterosuperior L3 body but less than 20 % associated spinal stenosis. Interbody implant and L4 pedicle screws appear intact. No arthrodesis is identified. L3-L4: Stable since 05/24/2022. Some endplate subsidence but no other hardware loosening. No arthrodesis. L4-L5: Stable since 05/24/2022. Some endplate subsidence and no arthrodesis identified. L5-S1: Stable since 05/24/2022. Chronically removed S1 pedicle screws. Some endplate subsidence, hand no interbody arthrodesis. Chronic left L5 pars fracture. Scant if any right side posterior element arthrodesis. IMPRESSION: 1. Unhealed fracture of the posterosuperior L3 body, ans associated avulsed appearance of both L3 pedicles (series 7, image 30) along the course of the bilateral L3 pedicle screws. Mild retropulsion of the posterior body fragment does not result in significant spinal stenosis. 2. No other acute osseous abnormality identified in the Lumbar Spine. But scant arthrodesis limited to only the right L5-S1 posterior elements following L3 through S1 fusion. Some endplate subsidence  at each level. 3.  Aortic Atherosclerosis (ICD10-I70.0).  Nephrolithiasis. Electronically Signed   By: Odessa Fleming M.D.   On: 08/21/2022 09:38    Labs:  Basic Metabolic Panel: Recent Labs  Lab 08/29/22 0731  NA 139  K 3.4*  CL 107  CO2 27  GLUCOSE 117*  BUN 9  CREATININE 0.76  CALCIUM 8.7*    CBC: Recent Labs  Lab 08/29/22 0731  WBC 6.1  NEUTROABS 4.2  HGB 9.6*  HCT 30.6*  MCV 102.0*  PLT 302    Brief HPI:   Katrina Rivera is a 77 y.o. female with a history of relapsing remitting multiple sclerosis who was evaluated by Dr. Yetta Barre regarding gait abnormality, chronic low back pain and status post lumbar fusion May 2024. She has reportedly fallen 4 times at home after PLIF of L3-5. She was admitted 08/17/2022 for scheduled surgery of L3 spinal fracture. She underwent posterior fixation L2-L4 using NuVasive cortical pedicle screws, arthrodesis and removal of segmental fixation L3-L5 by Dr. Yetta Barre. PT OT evaluations carried out. Incision is healed without signs of infection. She is appropriate back soreness. She has no complaints of leg pain or new numbness, tingling or weakness per Dr. Barnett Applebaum note today. Signs are stable and she is tolerating regular diet. Other past medical history includes restless leg syndrome, anxiety, headaches with migraine features, fibromyalgia, hypotension, anemia (acute on chronic).    Hospital Course: Katrina Rivera was admitted to rehab 08/19/2022 for inpatient therapies to consist of PT, ST and OT at least three hours five days a week. Past admission physiatrist, therapy team and rehab RN have worked together to provide customized collaborative inpatient rehab. Follow-up labs stable. Reported dizziness and vestibular eval arranged. pt walking 120' with therapies and bruises A LOT. Will stay with TEDS and activity OOB. She wants to avoid pharmacological VTE prophy. Medrol resumed for Addison's disease. Horizant not on formulary and held during hospitalization.  Added gabapentin in the mornings 300 mg for neck pain on 7/16. Pt is wondering if dizziness from CGRP shot monthly- I explained it could be- but will need to discuss with Neurology. 7/18- BP running 160s-170s- will add Norvasc 2.5 mg daily. Trigger point injections to back and neck. Smaller LSO brace obtained prior to discharge. Plan to continue current dosing of gabapentin and not restart Horizant at home.   Blood pressures were monitored on TID basis and she remained on Florinef for hypotension history. 7/18- BP running 160s-170s- will add Norvasc 2.5 mg daily for now   Rehab course: During patient's stay in rehab weekly team conferences were held to monitor patient's progress, set goals and discuss barriers to discharge. At admission, patient required  mod with basic self-care skills and IADL and min with mobility.  She  has had improvement in activity tolerance, balance, postural control as well as ability to compensate for deficits. She has had improvement in functional use RUE/LUE  and RLE/LLE as well as improvement in awareness. Patient has met 10 of 10 long term goals due to improved activity tolerance, improved balance, and postural control.  Patient to discharge at overall Supervision level.  Patient's care partner is independent to provide the necessary physical assistance at discharge.  PT and OT arranged at [facility] on [address].  Discharge disposition: 01-Home or Self Care      Diet: Regular  Special Instructions: No driving, alcohol consumption or tobacco use.  30-35 minutes were spent on discharge planning and discharge summary.  Discharge Instructions     Ambulatory referral to Physical Medicine Rehab   Complete by: As directed    Hospital follow-up   Discharge patient   Complete by: As directed  Discharge disposition: 01-Home or Self Care   Discharge patient date: 08/29/2022      Allergies as of 08/29/2022       Reactions   Amoxicillin Other (See  Comments)   Upset stomach   Aspirin    bruising   Azithromycin    stomach upset   Doxycycline Hyclate    GI upset   Hydroxyzine Hcl Other (See Comments)   Buspirone Hcl Palpitations   Mirtazapine Palpitations   Weight gain        Medication List     STOP taking these medications    Horizant 600 MG Tbcr Generic drug: Gabapentin Enacarbil       TAKE these medications    acetaminophen 325 MG tablet Commonly known as: TYLENOL Take 1-2 tablets (325-650 mg total) by mouth every 4 (four) hours as needed for mild pain. What changed:  medication strength how much to take when to take this reasons to take this   Aimovig 70 MG/ML Soaj Generic drug: Erenumab-aooe Inject 70 mg into the skin every 30 (thirty) days.   alfuzosin 10 MG 24 hr tablet Commonly known as: UROXATRAL Take 10 mg by mouth daily with breakfast.   amLODipine 2.5 MG tablet Commonly known as: NORVASC Take 1 tablet (2.5 mg total) by mouth daily.   cephALEXin 250 MG capsule Commonly known as: KEFLEX Take 250 mg by mouth every other day.   cholecalciferol 25 MCG (1000 UNIT) tablet Commonly known as: VITAMIN D3 Take 1,000 Units by mouth every morning.   clonazePAM 0.5 MG tablet Commonly known as: KLONOPIN Take 1 tablet (0.5 mg total) by mouth at bedtime as needed for anxiety. What changed: when to take this   CRANBERRY PO Take 1 tablet by mouth 2 (two) times daily.   dicyclomine 10 MG capsule Commonly known as: BENTYL Take 20 mg by mouth 3 (three) times daily before meals.   DULoxetine 60 MG capsule Commonly known as: CYMBALTA Take 1 capsule (60 mg total) by mouth daily.   ferrous sulfate 325 (65 FE) MG tablet Take 325 mg by mouth daily with breakfast.   fludrocortisone 0.1 MG tablet Commonly known as: FLORINEF Take 1 tablet daily What changed:  how much to take how to take this when to take this additional instructions   fluticasone 0.05 % cream Commonly known as: CUTIVATE Apply  1 application  topically 2 (two) times daily as needed for irritation (Rosacea).   fluticasone 50 MCG/ACT nasal spray Commonly known as: FLONASE Place 1 spray into both nostrils daily as needed for allergies or rhinitis.   gabapentin 300 MG capsule Commonly known as: NEURONTIN Take 3 capsules (900 mg total) by mouth at bedtime. What changed:  how much to take how to take this when to take this additional instructions   gabapentin 300 MG capsule Commonly known as: NEURONTIN Take 1 capsule (300 mg total) by mouth daily at 6 (six) AM. What changed: You were already taking a medication with the same name, and this prescription was added. Make sure you understand how and when to take each.   HYDROcodone-acetaminophen 7.5-325 MG tablet Commonly known as: NORCO Take 1 tablet by mouth every 4 (four) hours as needed for moderate pain ((score 4 to 6)).   Linzess 72 MCG capsule Generic drug: linaclotide Take 72 mcg by mouth daily as needed (Constipation).   methylPREDNISolone 4 MG tablet Commonly known as: MEDROL as directed Orally 1 tablet AM and 1/2 tablet afternoon   multivitamin tablet Take 1  tablet by mouth in the morning.   Nurtec 75 MG Tbdp Generic drug: Rimegepant Sulfate Take 1 tab at onset of migraine.  May repeat in 2 hrs, if needed.  Max dose: 2 tabs/day. This is a 30 day prescription. What changed:  how much to take how to take this when to take this   polyethylene glycol 17 g packet Commonly known as: MiraLax Take 17 g by mouth 2 (two) times daily. 17 grams in 6 oz of favorite drink twice a day until bowel movement.  LAXITIVE.  Restart if two days since last bowel movement What changed:  when to take this reasons to take this additional instructions   PROBIOTIC PO Take 1 capsule by mouth every morning.   promethazine 25 MG tablet Commonly known as: PHENERGAN Take 25 mg by mouth every 6 (six) hours as needed for nausea or vomiting.   SYSTANE OP Place 2  drops into both eyes daily as needed (for dry eyes).   tiZANidine 2 MG tablet Commonly known as: ZANAFLEX Take 2 tablets (4 mg total) by mouth every 8 (eight) hours as needed for muscle spasms. What changed: when to take this   VITAMIN B COMPLEX PO Take 1 capsule by mouth in the morning.   VITAMIN C PO Take 1 tablet by mouth every morning.        Follow-up Information     Noberto Retort, MD Follow up.   Specialty: Family Medicine Why: Call the office in 1-2 days to make arrangements for hospital follow-up appointment. Contact information: [address] [place] [address] [phone]         [name], MD Follow up.   Specialty: Physical Medicine and Rehabilitation Why: office will call you to arrange your appt (sent) Contact information: [address] [place] [address] [phone]         Arman Bogus, MD Follow up.   Specialty: Neurosurgery Why: Call the office in 1-2 days to make arrangements for hospital follow-up appointment. Contact information: [address] [place] [address] [phone]                 Signed: Milinda Antis 08/29/2022, 3:17 PM

## 2022-08-24 DIAGNOSIS — S32001A Stable burst fracture of unspecified lumbar vertebra, initial encounter for closed fracture: Secondary | ICD-10-CM | POA: Diagnosis not present

## 2022-08-24 NOTE — Plan of Care (Signed)
  Problem: Consults Goal: RH GENERAL PATIENT EDUCATION Description: See Patient Education module for education specifics. Outcome: Progressing   Problem: RH BOWEL ELIMINATION Goal: RH STG MANAGE BOWEL WITH ASSISTANCE Description: STG Manage Bowel with min Assistance. Outcome: Progressing Goal: RH STG MANAGE BOWEL W/MEDICATION W/ASSISTANCE Description: STG Manage Bowel with Medication with min Assistance. Outcome: Progressing   Problem: RH SKIN INTEGRITY Goal: RH STG ABLE TO PERFORM INCISION/WOUND CARE W/ASSISTANCE Description: STG Able To Perform Incision/Wound Care With min Assistance. Outcome: Progressing   Problem: RH SAFETY Goal: RH STG ADHERE TO SAFETY PRECAUTIONS W/ASSISTANCE/DEVICE Description: STG Adhere to Safety Precautions With min Assistance/Device. Outcome: Progressing Goal: RH STG DECREASED RISK OF FALL WITH ASSISTANCE Description: STG Decreased Risk of Fall With cueing Assistance. Outcome: Progressing   Problem: RH PAIN MANAGEMENT Goal: RH STG PAIN MANAGED AT OR BELOW PT'S PAIN GOAL Description: Pain will be managed 4 out of 10 on pain scale with PRN medications min assist  Outcome: Progressing   Problem: RH KNOWLEDGE DEFICIT GENERAL Goal: RH STG INCREASE KNOWLEDGE OF SELF CARE AFTER HOSPITALIZATION Description: Patient/caregiver will be able to manage medications, wound care, and self care from nursing education, nursing handouts, and other sources independently  Outcome: Progressing   Problem: Education: Goal: Ability to verbalize activity precautions or restrictions will improve Outcome: Progressing Goal: Knowledge of the prescribed therapeutic regimen will improve Outcome: Progressing Goal: Understanding of discharge needs will improve Outcome: Progressing   Problem: Activity: Goal: Ability to avoid complications of mobility impairment will improve Outcome: Progressing Goal: Ability to tolerate increased activity will improve Outcome:  Progressing Goal: Will remain free from falls Outcome: Progressing   Problem: Bowel/Gastric: Goal: Gastrointestinal status for postoperative course will improve Outcome: Progressing   Problem: Clinical Measurements: Goal: Ability to maintain clinical measurements within normal limits will improve Outcome: Progressing Goal: Postoperative complications will be avoided or minimized Outcome: Progressing Goal: Diagnostic test results will improve Outcome: Progressing   Problem: Pain Management: Goal: Pain level will decrease Outcome: Progressing   Problem: Skin Integrity: Goal: Will show signs of wound healing Outcome: Progressing   Problem: Health Behavior/Discharge Planning: Goal: Identification of resources available to assist in meeting health care needs will improve Outcome: Progressing   Problem: Bladder/Genitourinary: Goal: Urinary functional status for postoperative course will improve Outcome: Progressing

## 2022-08-24 NOTE — Progress Notes (Signed)
Occupational Therapy Session Note  Patient Details  Name: Katrina Rivera MRN: [id number] Date of Birth: 31-Dec-1945  Today's Date: 08/24/2022 OT Individual Time: 1030-1200 OT Individual Time Calculation (min): 90 min (30 extra minutes seen)    Short Term Goals: Week 1:  OT Short Term Goal 1 (Week 1): STG=LTG's based on LOS  Skilled Therapeutic Interventions/Progress Updates:   Pt seen for skilled OT session with extra minutes due to nature of session and therapist availability. Pt requesting shower as pt had not yet had full shower since admission to CIR. OT provided waterproofing to spinal incision and and set up of LH sponge and ADL items in reach for shower stall on TTB access. Pt amb with Rw with CGA and transferred in and out with same. Cues for forward weight shifts to align body for tasks seated and standing. Use of LH sponge and grab bar support for LB bathing and buttocks reach in standing with CGA. Dressing then completed w/c level with reacher use with min A overall. LSO with CGA and cues. OT transported pt outdoors for well being and simple community skills via w/c and back to unit. Short distances (20 ft x 3) w/c propulsion with rests. Once back in room, pt amb from doorway to bedside with CGA with RW and left w/c level for lunch with tray table, needs and chair alarm active.    Pain: 5/10 initially in mid to lower back   Therapy Documentation Precautions:  Precautions Precautions: Fall, Back Precaution Booklet Issued: No Precaution Comments: recalled 2/3 from prior surgeries, pt with 4 falls since last surgery Required Braces or Orthoses: Spinal Brace Spinal Brace: Applied in sitting position, Lumbar corset Restrictions Weight Bearing Restrictions: No   Therapy/Group: Individual Therapy  Vicenta Dunning 08/24/2022, 7:51 AM

## 2022-08-24 NOTE — Progress Notes (Signed)
Physical Therapy Session Note  Patient Details  Name: Katrina Rivera MRN: [id number] Date of Birth: 1945-05-31  Today's Date: 08/24/2022 PT Individual Time: 0800-0915 PT Individual Time Calculation (min): 75 min   Short Term Goals: Week 1:  PT Short Term Goal 1 (Week 1): STG=LTG 2/2 ELOS.  Skilled Therapeutic Interventions/Progress Updates:      Therapy Documentation Precautions:  Precautions Precautions: Fall, Back Precaution Booklet Issued: No Precaution Comments: recalled 2/3 from prior surgeries, pt with 4 falls since last surgery Required Braces or Orthoses: Spinal Brace Spinal Brace: Applied in sitting position, Lumbar corset Restrictions Weight Bearing Restrictions: No   Pt received semi-reclined in bed, agreeable to PT and without reports of pain in session. PT assessed vitals with BP 135/49 (75) and pulse of 72 with O2 of 98% bed level. Pt requested to performing morning routine and requires set-up for upper body dressing, min A for lower body dressing, and (S) for grooming. Pt requires increased time with min cues to attend to task at hand.   Pt ambulated with RW (S) to main gym ~200 ft and PT attempted to work on [employer] with green theraband in standing but pt unable to perform. Pt presents with knee ROM deficit and initially lacked 14 degrees from full knee extension. PT performed patellar mobilizations superiorly and multi-directionally and pt presents with improved range and lacks 11 degrees from terminal extension.   Pt left in care of nurse in gym area for medication administration.    Therapy/Group: Individual Therapy  Truitt Leep Truitt Leep PT, DPT  08/24/2022, 6:12 AM

## 2022-08-24 NOTE — Progress Notes (Signed)
Occupational Therapy Session Note  Patient Details  Name: GALAXY BORDEN MRN: [id number] Date of Birth: Apr 07, 1945  Today's Date: 08/24/2022 OT Individual Time: 1000-1030 OT Individual Time Calculation (min): 30 min    Short Term Goals: Week 1:  OT Short Term Goal 1 (Week 1): STG=LTG's based on LOS  Skilled Therapeutic Interventions/Progress Updates:      Therapy Documentation Precautions:  Precautions Precautions: Fall, Back Precaution Booklet Issued: No Precaution Comments: recalled 2/3 from prior surgeries, pt with 4 falls since last surgery Required Braces or Orthoses: Spinal Brace Spinal Brace: Applied in sitting position, Lumbar corset Restrictions Weight Bearing Restrictions: No General: "My husband is mowing the grass again." Pt seated in W/C upon OT arrival, agreeable to OT.  Vital Signs: BP: 137/71 BPM: 88 SpO2: 98%   Pain: No pain reported/noted.   ADL: Eating: set-up for drinking seated in W/C Grooming: pt standing at sink SBA at RW for hand hygiene Toilet transfer: SBA ambulating ~10 ft from W/C>< bathroom with RW, no LOB Toileting: SBA to manage pants, no hygiene requires this date d/t no BM  Transfers/functional mobility: Pt ambulated community distances therapy gym><room with RW with SBA with no LOB/SOB, VC to straighten legs and posture when ambulating, increased time to complete d/t small stride length    Pt seated in W/C at end of session with W/C alarm donned, call light within reach and 4Ps assessed.    Therapy/Group: Individual Therapy  Velia Meyer, OTD, OTR/L 08/24/2022, 11:59 AM

## 2022-08-24 NOTE — Progress Notes (Signed)
Physical Therapy Session Note  Patient Details  Name: Katrina Rivera MRN: [id number] Date of Birth: [birth year]  Today's Date: 08/24/2022 PT Individual Time: [phone] PT Individual Time Calculation (min): 44 min   Short Term Goals: Week 1:  PT Short Term Goal 1 (Week 1): STG=LTG 2/2 ELOS.  Skilled Therapeutic Interventions/Progress Updates:    Pt received sitting in w/c with NT finishing with pt and pt agreeable to therapy session. Pt already wearing lumbar corset. Sit<>stands using RW with CGA/SBA for safety throughout session  Gait training ~169ft x2 to/from main therapy gym using RW with CGA for safety. Pt demonstrating the following gait deviations with therapist providing the described cuing and facilitation for improvement:  - decreased foot clearance bilaterally - L knee stays flexed throughout stance with lack of terminal knee extension and pt reports hx of knee surgery contributing to these deficits - partial reciprocal stepping pattern   - decreased gait speed  Discussed home entry as pt was problem solving this with PT yesterday. Pt's husband measured step height and pt has 1x 8" step + 1 shorter step-up into the house without HRs to enter the kitchen from concrete carport (pt cannot use RW on the 1st step because it has normal step depth) - pt reports this is her primary home entrance because it comes in from the carport. Pt does have 3 STE the front door without railings, but states she never uses that entrance to home. Pt still hoping she will be able to have handrails installed prior to D/C and this may be necessary for safe home entry depending on pt's progress and ability to problem solve another entry technique. Asked if pt's husband could take pictures of the entryway to help with problem solving safe entry and she is unsure if he knows how to use camera on his phone.  Pt reports she did not feel comfortable with using the cane and HHA to step on/off the 8" step like was  practiced yesterday.  Discussed option of stepping up onto the 8" step backwards to allow B UE support on RW - attempted first on the 8in step, but pt did not feel confident attempting with either LE. Transitioned to stepping up backwards on 5" step using RW x 6 reps leading with R LE and pt completed with light min assist (1x R knee slightly giving way, but able to recover with min assist and no additional occurences of this) - pt continues to lack strong ability to power up through LEs.   Pt reports she was having difficulty lifting her legs in/out of vehicle from seated position at baseline - would benefit from practicing real life car transfer and tub transfer using tub transfer bench due to similar mechanics, if time allows.  Gait back to room as described above. Sitting EOB doffed LSO. Sit>supine via reverse logroll technique with supervision. Pt left in bed with needs in reach and bed alarm on.     Therapy Documentation Precautions:  Precautions Precautions: Fall, Back Precaution Booklet Issued: No Precaution Comments: recalled 2/3 from prior surgeries, pt with 4 falls since last surgery Required Braces or Orthoses: Spinal Brace Spinal Brace: Applied in sitting position, Lumbar corset Restrictions Weight Bearing Restrictions: No   Pain: No complaints of pain throughout session.    Therapy/Group: Individual Therapy  Ginny Forth , PT, DPT, NCS, CSRS 08/24/2022, 3:36 PM

## 2022-08-24 NOTE — Progress Notes (Signed)
PROGRESS NOTE   Subjective/Complaints:  Pt is wondering if dizziness from CGRP shot monthly- I explained it could be- but will need to discuss with Neurology.  Cannot fix that right now Will try TrP injections which rarely can help with dizziness- vestibular eval done- central cause was determined. No BPPV.   Pt concerned lsm yesterday was loose- went over need to change meds which was done yesterday so hadn't kicked in yet.  Said BP dropping as wlel but it's high- not sure if was that way before.     ROS:   Pt denies SOB, abd pain, CP, N/V/C/ (+) x1D, and vision changes  Except for HPI  Objective:   No results found. Recent Labs    08/22/22 0632  WBC 5.1  HGB 9.0*  HCT 28.4*  PLT 194    Recent Labs    08/22/22 0632  NA 138  K 3.6  CL 107  CO2 25  GLUCOSE 86  BUN 16  CREATININE 0.73  CALCIUM 8.9    Intake/Output Summary (Last 24 hours) at 08/24/2022 1610 Last data filed at 08/24/2022 0735 Gross per 24 hour  Intake 1200 ml  Output --  Net 1200 ml        Physical Exam: Vital Signs Blood pressure (!) 145/56, pulse 72, temperature 97.7 F (36.5 C), temperature source Oral, resp. rate 16, height 5' (1.524 m), weight 51.9 kg, SpO2 94%.      General: awake, alert, very anxious- supine in bed; NAD HENT: conjugate gaze; oropharynx moist CV: regular rate and rhythm; no JVD Pulmonary: CTA B/L; no W/R/R- good air movement GI: soft, NT, ND, (+)BS- slightly hypoactive this AM Psychiatric: appropriate-very anxious Neurological: alert   Ext: no clubbing, cyanosis, or edema Psych: pleasant and cooperative  Skin: back incision with honeycomb dressing--no change in area where there's old blood at superior aspect of incision. Neuro:  Alert and oriented x 3. Normal insight and awareness. Intact Memory. Normal language and speech. Cranial nerve exam unremarkable. MMT: UE 5/5. LE 3 to 3+ HF, KE and 4- to 4/5  ADF/PF. Decreased LT below knees and in fingers. DTR's tr to 1+. .   Musculoskeletal:  LB TTP and with bed mobility   Assessment/Plan: 1. Functional deficits which require 3+ hours per day of interdisciplinary therapy in a comprehensive inpatient rehab setting. Physiatrist is providing close team supervision and 24 hour management of active medical problems listed below. Physiatrist and rehab team continue to assess barriers to discharge/monitor patient progress toward functional and medical goals  Care Tool:  Bathing    Body parts bathed by patient: Right arm, Left arm, Chest, Abdomen, Front perineal area, Right upper leg, Left upper leg, Face   Body parts bathed by helper: Right lower leg, Left lower leg, Buttocks     Bathing assist Assist Level: Moderate Assistance - Patient 50 - 74%     Upper Body Dressing/Undressing Upper body dressing   What is the patient wearing?: Pull over shirt, Orthosis    Upper body assist Assist Level: Minimal Assistance - Patient > 75%    Lower Body Dressing/Undressing Lower body dressing      What is the patient  wearing?: Underwear/pull up, Pants     Lower body assist Assist for lower body dressing: Moderate Assistance - Patient 50 - 74%     Toileting Toileting    Toileting assist Assist for toileting: Minimal Assistance - Patient > 75%     Transfers Chair/bed transfer  Transfers assist     Chair/bed transfer assist level: Minimal Assistance - Patient > 75%     Locomotion Ambulation   Ambulation assist      Assist level: Minimal Assistance - Patient > 75% Assistive device: Walker-rolling Max distance: 150   Walk 10 feet activity   Assist     Assist level: Minimal Assistance - Patient > 75% Assistive device: Walker-rolling   Walk 50 feet activity   Assist    Assist level: Minimal Assistance - Patient > 75% Assistive device: Walker-rolling    Walk 150 feet activity   Assist    Assist level: Minimal  Assistance - Patient > 75% Assistive device: Walker-rolling    Walk 10 feet on uneven surface  activity   Assist Walk 10 feet on uneven surfaces activity did not occur: Safety/medical concerns         Wheelchair     Assist Is the patient using a wheelchair?: Yes Type of Wheelchair: Manual    Wheelchair assist level: Supervision/Verbal cueing Max wheelchair distance: 150    Wheelchair 50 feet with 2 turns activity    Assist        Assist Level: Supervision/Verbal cueing   Wheelchair 150 feet activity     Assist      Assist Level: Supervision/Verbal cueing   Blood pressure (!) 145/56, pulse 72, temperature 97.7 F (36.5 C), temperature source Oral, resp. rate 16, height 5' (1.524 m), weight 51.9 kg, SpO2 94%.  Medical Problem List and Plan: 1. Functional deficits secondary to L3 fx s/p L2-L4 fixation and removal of previous hardware.              -hx also significant for relaxing-remitting MS             -patient may shower             -ELOS/Goals: 7-10 days, mod I to supervision goals with PT and OT   D/c 7/22  Con't CIR PT and OT Assuaged pt's concerns this AM about causes of dizziness/bruising/bowels/nausea 2.  Antithrombotics: -DVT/anticoagulation:  Mechanical:  Antiembolism stockings, knee (TED hose) Bilateral lower extremities>>pt walking 120' with therapies and bruises A LOT. Will stay with TEDS and activity OOB  -pt wants to avoid blood thinners             -antiplatelet therapy: none   7/17- pt concerned about brusing, but explained don't have a way to fix- Decadron  increases the bruising.  3. Pain Management/FMS/peripheral neuropathy/RLS: Tylenol, Norco, Zanaflex as needed             -continue gabapentin 900 mg q HS             -have discussed meds for RLS with pt. She pays $100 a month for horizant which she takes on top of gabapentin. She hasn't noticed a big change since she's been without while in hospital. Is interested in stopping it.  I told her that we could add 300mg  of gabapentin in the am or gabapentin 300mg  bid in horizant's place. May do just fine with the 900mg  gabapentin at bedtime.  7/15- pt says pain ok this AM- 5/10 at worst 2/10 at rest/right now  7/16- more pain last night- therapy overdid it?- cont' regimen- will add Gabapentin 300 mg qam- pain in neck - trP- will do injections Thursday  7/17- assured pt will do Trp injections tomorrow for HA's/neck pain 4. Mood/Behavior/Sleep: LCSW to evaluate and provide emotional support             -antipsychotic agents: n/a             -continue Cymbalta 60 mg daily             -clonazepam 0.5 mg prn anxiety             -hx of migraine HA>>on erenumab injection monthly, Nurtec as needed   7/17- pt feels monthly shot could be cause of her dizziness/ BP issues 5. Neuropsych/cognition: This patient is capable of making decisions on her own behalf.   6. Skin/Wound Care: Routine skin care checks             -observe surgical incision for increased drainage 7. Fluids/Electrolytes/Nutrition: Routine Is and Os and follow-up chemistries on Monday             -continue acidophilus, vitamin C, B-complex, D3   8: Hypotension: monitor TID and prn             -continue Florinef   7/15- BP 150s systolic this AM- laying down- will monitor for trend  7/16- BP elevated this AM- thinks due to receiving Flornief late- explained usually Florinef treats LOW BP.   7/17-BP running 140s-150s or higher, however drops 30 points when stands up- don't feel comfortable adding BP meds when dropping so much- ON tiny dose of Flornef for Orthostatic hypotension that she's having.   9: Anemia: continue oral iron and follow-up CBC   10: IBS: continue acidophilus and dicyclomine             -Linzess as needed for constipation--pt doesn't like to use because it causes urgent blowouts. Holding for now. Prefers to use miralax                         -continue senokot-s and miralax scheduled                          -have miralax prn available also   -last bm 7/13 without any extra medication   7/15- LBM this AM- likes miralax.    7/16- LBM yesterday but very loose/almost liquid-wil reduce Senna to 1 tab/day and change miralax to every other day-   7/17- LBM yesterday- still loose- explained will take new regimen time to kick in since changed yesterday 12. Neurogenic bladder d/t MS             -tends to retain             -first PVR 488 this morning             -continue uroxatral             -oob to void             -uses keflex every other day as prophylaxis against UTI's   7/15- stable- no issues right now, per pt.  13. 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   We addressed every concern.    LOS: 5 days A FACE TO FACE EVALUATION WAS PERFORMED  Rosana Farnell 08/24/2022, 8:32 AM

## 2022-08-25 MED ORDER — LIDOCAINE HCL (PF) 1 % IJ SOLN
10.0000 mL | Freq: Once | INTRAMUSCULAR | Status: AC
  Administered 2022-08-25: 10 mL
  Filled 2022-08-25: qty 30

## 2022-08-25 MED ORDER — AMLODIPINE BESYLATE 2.5 MG PO TABS
2.5000 mg | ORAL_TABLET | Freq: Every day | ORAL | Status: DC
  Administered 2022-08-25 – 2022-08-29 (×5): 2.5 mg via ORAL
  Filled 2022-08-25 (×5): qty 1

## 2022-08-25 NOTE — Evaluation (Signed)
Recreational Therapy Assessment and Plan  Patient Details  Name: Katrina Rivera MRN: [id number] Date of Birth: [birth year] Today's Date: 08/25/2022  Rehab Potential:  Good ELOS:   d/c 7/22  Assessment Hospital Problem: Principal Problem:   Lumbar vertebral fracture (HCC)     Past Medical History:      Past Medical History:  Diagnosis Date   Addison's disease (HCC)      takes Solu Cortef daily   Anemia      takes Ferrous Sulfate daily   Anxiety      takes Xanax nightly   Arthritis     Chronic back pain      stenosis   Depression      takes Cymbalta daily   Dizziness      if b/p drops    Fibromyalgia     History of blood transfusion      no abnormal reaction noted   History of bronchitis      many yrs ago    Hypokalemia      takes Potassium daily   Hypotension      takes Florinef daily   IBS (irritable bowel syndrome)      takes Align daily   Insomnia      takes Trazodone nightly   Joint pain     Multiple sclerosis (HCC)      doesn't take any meds   Multiple sclerosis (HCC)     New onset headache 12/22/2021   Nocturia     Osteoporosis     Palpitations     Peripheral neuropathy     Primary localized osteoarthritis of left knee 08/11/2020   Restless leg syndrome     Seasonal allergies      takes Zyrtec daily;uses Flonase daily as needed   Syncope      when missed methylprednisolone dose   Urinary frequency      takes Flomax daily   Weakness      numbness and tingling        Past Surgical History:       Past Surgical History:  Procedure Laterality Date   ABDOMINAL HYSTERECTOMY   02/07/1986   APPENDECTOMY   02/07/1986   CESAREAN SECTION   1973/1977    x2   CHOLECYSTECTOMY   02/08/1995   COLECTOMY   02/08/1988   COLONOSCOPY       ESOPHAGOGASTRODUODENOSCOPY       EYE SURGERY        bilateral - /w IOL- cataracts   FRACTURE SURGERY Right      rods and screws-right leg   LUMBAR LAMINECTOMY/DECOMPRESSION MICRODISCECTOMY Left 01/24/2013     Procedure: LUMBAR FIVE TO SACRAL ONE LUMBAR LAMINECTOMY/DECOMPRESSION MICRODISCECTOMY 1 LEVEL;  Surgeon: Tia Alert, MD;  Location: [facility];  Service: Neurosurgery;  Laterality: Left;   MAXIMUM ACCESS (MAS)POSTERIOR LUMBAR INTERBODY FUSION (PLIF) 1 LEVEL N/A 06/18/2014    Procedure: MAXIMUM ACCESS SURGERY POSTERIOR LUMBAR INTERBODY FUSION LUMBAR FIVE TO SACRAL ONE ;  Surgeon: Tia Alert, MD;  Location: [facility];  Service: Neurosurgery;  Laterality: N/A;   SPINAL CORD STIMULATOR BATTERY EXCHANGE Right 07/30/2021    Procedure: Spinal cord stimulator battery replacement;  Surgeon: Tia Alert, MD;  Location: [facility];  Service: Neurosurgery;  Laterality: Right;   SPINAL CORD STIMULATOR IMPLANT       TOTAL KNEE ARTHROPLASTY Left 08/24/2020    Procedure: TOTAL KNEE ARTHROPLASTY;  Surgeon: Salvatore Marvel, MD;  Location: [facility];  Service: Orthopedics;  Laterality: Left;          Assessment & Plan Clinical Impression: Katrina Rivera is a 77 year old female with a history of relapsing remitting multiple sclerosis who was evaluated by Dr. Yetta Barre regarding gait abnormality, chronic low back pain and status post lumbar fusion May 2024. She has reportedly fallen 4 times at home after PLIF of L3-5. She was admitted 08/17/2022 for scheduled surgery of L3 spinal fracture. She underwent posterior fixation L2-L4 using NuVasive cortical pedicle screws, arthrodesis and removal of segmental fixation L3-L5 by Dr. Yetta Barre. PT OT evaluations carried out. Incision is healed without signs of infection. She is appropriate back soreness. She has no complaints of leg pain or new numbness, tingling or weakness per Dr. Barnett Applebaum note today. Signs are stable and she is tolerating regular diet. Other past medical history includes restless leg syndrome, anxiety, headaches with migraine features, fibromyalgia, hypotension, anemia (acute on chronic).The patient requires inpatient medicine and rehabilitation evaluations and  services for ongoing dysfunction secondary to L3 spinal fracture    Pt presents with decreased activity tolerance, decreased functional mobility, decreased balance, decreased coordination, difficulty maintaining precautions, feelings of stress Limiting pt's independence with leisure/community pursuits.  Met with pt today to discuss TR services including leisure education, activity analysis/modifications and stress management.  Also discussed the importance of social, emotional, spiritual health in addition to physical health and their effects on overall health and wellness.  Pt stated understanding.   Plan  Min 1 TR session >20 minutes during LOS  Recommendations for other services: None   Discharge Criteria: Patient will be discharged from TR if patient refuses treatment 3 consecutive times without medical reason.  If treatment goals not met, if there is a change in medical status, if patient makes no progress towards goals or if patient is discharged from hospital.  The above assessment, treatment plan, treatment alternatives and goals were discussed and mutually agreed upon: by patient  Session Note: Goal:  Pt will participate in OOB leisure activity addressing activity tolerance with supervision >30 minutes.  MET Pain:  no c/o Pt participated in therapeutic dance group seated w/c level with supervision.  Pt initiated rest breaks as needed throughout this session.  Pt adhered to back precautions without reminders outside of initial review of restrictions.  Pt participated fully in 60 minute group session.  Pt stated appreciation for this group.  Mykalah Saari 08/25/2022, 12:15 PM

## 2022-08-25 NOTE — Progress Notes (Signed)
PROGRESS NOTE   Subjective/Complaints:  Pt reports still having dizziness- but slightly better so far this AM-  reminded her if we think it's CGRP injection to call Neuro and arrange to hold at end of month- due 7/30-   Also still having bad neck pain -wants trP injections.  BP up- and wondering what to do- we discussed, will start BP med to help.    ROS:    Pt denies SOB, abd pain, CP, N/V/C/D, and vision changes  Except for HPI  Objective:   No results found. No results for input(s): "WBC", "HGB", "HCT", "PLT" in the last 72 hours.   No results for input(s): "NA", "K", "CL", "CO2", "GLUCOSE", "BUN", "CREATININE", "CALCIUM" in the last 72 hours.   Intake/Output Summary (Last 24 hours) at 08/25/2022 1050 Last data filed at 08/25/2022 0840 Gross per 24 hour  Intake 914 ml  Output --  Net 914 ml        Physical Exam: Vital Signs Blood pressure (!) 169/81, pulse 82, temperature 97.6 F (36.4 C), temperature source Oral, resp. rate 16, height 5' (1.524 m), weight 51.3 kg, SpO2 100%.      General: awake, alert, appropriate, sitting up in bed; then up in w/c; NAD HENT: conjugate gaze; oropharynx moist CV: regular rate and rhythm; no JVD Pulmonary: CTA B/L; no W/R/R- good air movement GI: soft, NT, ND, (+)BS Psychiatric: appropriate- tangential and anxious and very verbose Neurological: Ox3  MS- very tight in upper back, shoulders and neck esp Ext: no clubbing, cyanosis, or edema Psych: pleasant and cooperative  Skin: back incision with honeycomb dressing--no change in area where there's old blood at superior aspect of incision. Neuro:  Alert and oriented x 3. Normal insight and awareness. Intact Memory. Normal language and speech. Cranial nerve exam unremarkable. MMT: UE 5/5. LE 3 to 3+ HF, KE and 4- to 4/5 ADF/PF. Decreased LT below knees and in fingers. DTR's tr to 1+. .   Musculoskeletal:  LB TTP and with  bed mobility   Assessment/Plan: 1. Functional deficits which require 3+ hours per day of interdisciplinary therapy in a comprehensive inpatient rehab setting. Physiatrist is providing close team supervision and 24 hour management of active medical problems listed below. Physiatrist and rehab team continue to assess barriers to discharge/monitor patient progress toward functional and medical goals  Care Tool:  Bathing    Body parts bathed by patient: Right arm, Left arm, Chest, Abdomen, Front perineal area, Buttocks, Right upper leg, Left upper leg, Right lower leg, Left lower leg, Face   Body parts bathed by helper: Right lower leg, Left lower leg, Buttocks     Bathing assist Assist Level: Minimal Assistance - Patient > 75%     Upper Body Dressing/Undressing Upper body dressing   What is the patient wearing?: Pull over shirt, Orthosis    Upper body assist Assist Level: Minimal Assistance - Patient > 75%    Lower Body Dressing/Undressing Lower body dressing      What is the patient wearing?: Underwear/pull up, Pants     Lower body assist Assist for lower body dressing: Moderate Assistance - Patient 50 - 74%     Toileting  Toileting    Toileting assist Assist for toileting: Minimal Assistance - Patient > 75%     Transfers Chair/bed transfer  Transfers assist     Chair/bed transfer assist level: Minimal Assistance - Patient > 75%     Locomotion Ambulation   Ambulation assist      Assist level: Minimal Assistance - Patient > 75% Assistive device: Walker-rolling Max distance: 150   Walk 10 feet activity   Assist     Assist level: Minimal Assistance - Patient > 75% Assistive device: Walker-rolling   Walk 50 feet activity   Assist    Assist level: Minimal Assistance - Patient > 75% Assistive device: Walker-rolling    Walk 150 feet activity   Assist    Assist level: Minimal Assistance - Patient > 75% Assistive device: Walker-rolling     Walk 10 feet on uneven surface  activity   Assist Walk 10 feet on uneven surfaces activity did not occur: Safety/medical concerns         Wheelchair     Assist Is the patient using a wheelchair?: Yes Type of Wheelchair: Manual    Wheelchair assist level: Supervision/Verbal cueing Max wheelchair distance: 150    Wheelchair 50 feet with 2 turns activity    Assist        Assist Level: Supervision/Verbal cueing   Wheelchair 150 feet activity     Assist      Assist Level: Supervision/Verbal cueing   Blood pressure (!) 169/81, pulse 82, temperature 97.6 F (36.4 C), temperature source Oral, resp. rate 16, height 5' (1.524 m), weight 51.3 kg, SpO2 100%.  Medical Problem List and Plan: 1. Functional deficits secondary to L3 fx s/p L2-L4 fixation and removal of previous hardware.              -hx also significant for relaxing-remitting MS             -patient may shower             -ELOS/Goals: 7-10 days, mod I to supervision goals with PT and OT   D/c 7/22  Con't CIR PT and OT Trigger point injections today- consent signed 2.  Antithrombotics: -DVT/anticoagulation:  Mechanical:  Antiembolism stockings, knee (TED hose) Bilateral lower extremities>>pt walking 120' with therapies and bruises A LOT. Will stay with TEDS and activity OOB  -pt wants to avoid blood thinners             -antiplatelet therapy: none   7/17- pt concerned about brusing, but explained don't have a way to fix- Decadron  increases the bruising.  3. Pain Management/FMS/peripheral neuropathy/RLS: Tylenol, Norco, Zanaflex as needed             -continue gabapentin 900 mg q HS             -have discussed meds for RLS with pt. She pays $100 a month for horizant which she takes on top of gabapentin. She hasn't noticed a big change since she's been without while in hospital. Is interested in stopping it. I told her that we could add 300mg  of gabapentin in the am or gabapentin 300mg  bid in  horizant's place. May do just fine with the 900mg  gabapentin at bedtime.  7/15- pt says pain ok this AM- 5/10 at worst 2/10 at rest/right now  7/16- more pain last night- therapy overdid it?- cont' regimen- will add  Gabapentin 300 mg qam- pain in neck - trP- will do injections Thursday  7/17- assured pt will  do Trp injections tomorrow for HA's/neck pain  7/18- did trigger point injections- pain dropped from "10/10 to 5/10".  4. Mood/Behavior/Sleep: LCSW to evaluate and provide emotional support             -antipsychotic agents: n/a             -continue Cymbalta 60 mg daily             -clonazepam 0.5 mg prn anxiety             -hx of migraine HA>>on erenumab injection monthly, Nurtec as needed   7/17- pt feels monthly shot could be cause of her dizziness/ BP issues  7/18- it's due ot 7/30- told pt to call Neurologist and d/w them holding off on giving self CGRP shot on 7/30 5. Neuropsych/cognition: This patient is capable of making decisions on her own behalf.   6. Skin/Wound Care: Routine skin care checks             -observe surgical incision for increased drainage 7. Fluids/Electrolytes/Nutrition: Routine Is and Os and follow-up chemistries on Monday             -continue acidophilus, vitamin C, B-complex, D3   8: Hypotension: monitor TID and prn             -continue Florinef   7/15- BP 150s systolic this AM- laying down- will monitor for trend  7/16- BP elevated this AM- thinks due to receiving Flornief late- explained usually Florinef treats LOW BP.   7/17-BP running 140s-150s or higher, however drops 30 points when stands up- don't feel comfortable adding BP meds when dropping so much- ON tiny dose of Flornef for Orthostatic hypotension that she's having.    7/18- BP running 160s-170s- will add Norvasc 2.5 mg daily for now- d/w pt 9: Anemia: continue oral iron and follow-up CBC   10: IBS: continue acidophilus and dicyclomine             -Linzess as needed for constipation--pt  doesn't like to use because it causes urgent blowouts. Holding for now. Prefers to use miralax                         -continue senokot-s and miralax scheduled                         -have miralax prn available also   -last bm 7/13 without any extra medication   7/15- LBM this AM- likes miralax.    7/16- LBM yesterday but very loose/almost liquid-wil reduce Senna to 1 tab/day and change miralax to every other day-   7/17- LBM yesterday- still loose- explained will take new regimen time to kick in since changed yesterday 12. Neurogenic bladder d/t MS             -tends to retain             -first PVR 488 this morning             -continue uroxatral             -oob to void             -uses keflex every other day as prophylaxis against UTI's   7/15- stable- no issues right now, per pt.  13. Addison's disease             -resumed medrol:  4mg  am and 2mg  pm  daily     Patient here for trigger point injections for  Consent done and on chart.  Cleaned areas with alcohol and injected using a 27 gauge 1.5 inch needle  Injected 3cc- none wasted Using 1% Lidocaine with no EPI  Upper traps B/L  Levators- B/L  Posterior scalenes Middle scalenes- B/L x2 Splenius Capitus Pectoralis Major- B/L  Rhomboids- B/L - upper Infraspinatus Teres Major/minor Thoracic paraspinals Lumbar paraspinals Other injections-    Patient's level of pain prior was 10/10 Current level of pain after injections is now 5/10 and dropping  There was no bleeding or complications.  Patient was advised to drink a lot of water on day after injections to flush system   I spent a total of 59   minutes on total care today- >50% coordination of care- due to  Due to prolonged time in room addressing concerns/worries as well as trigger point injections   LOS: 6 days A FACE TO FACE EVALUATION WAS PERFORMED  Jasha Hodzic 08/25/2022, 10:50 AM

## 2022-08-25 NOTE — Discharge Instructions (Addendum)
Inpatient Rehab Discharge Instructions  Katrina Rivera Discharge date and time: 08/29/2022   Activities/Precautions/ Functional Status: Activity: no lifting, driving, or strenuous exercise until cleared by MD Diet: regular diet Wound Care: keep wound clean and dry Functional status:  ___ No restrictions     ___ Walk up steps independently ___ 24/7 supervision/assistance   ___ Walk up steps with assistance _x__ Intermittent supervision/assistance  ___ Bathe/dress independently ___ Walk with walker     ___ Bathe/dress with assistance ___ Walk Independently    ___ Shower independently ___ Walk with assistance    _x__ Shower with assistance _x__ No alcohol     ___ Return to work/school ________  Special Instructions: No driving, alcohol consumption or tobacco use.   COMMUNITY REFERRALS UPON DISCHARGE:    Outpatient: PT   & OT             Agency:[facility] [address]  [address] [place] [address] Phone:[phone]              Appointment Date/Time:WILL CALL YOU TO SET UP FOLLOW UP APPOINTMENTS  Medical Equipment/Items Ordered:WILL GET TUB BENCH AND HAND HELP SHOWER ON HER OWN HAS OTHER PIECES OF EQUIPMENT FROM PAST ADMISSIONS                                                 Agency/Supplier:NA     My questions have been answered and I understand these instructions. I will adhere to these goals and the provided educational materials after my discharge from the hospital.  Patient/Caregiver Signature _______________________________ Date __________  Clinician Signature _______________________________________ Date __________  Please bring this form and your medication list with you to all your follow-up doctor's appointments.

## 2022-08-25 NOTE — Group Note (Signed)
Patient Details Name: Katrina Rivera MRN: [id number] DOB: October 10, 1945 Today's Date: 08/25/2022  Time Calculation: OT Group Time Calculation OT Group Start Time: 1100 OT Group Stop Time: 1200 OT Group Time Calculation (min): 60 min      Group Description: Dance Group: Pt participated in dance group with an emphasis on social interaction, motor planning, increasing overall activity tolerance and bimanual tasks. All songs were selected by group members. Dance moves included AROM of BUE/BLE gross motor movements with an emphasis on building functional endurance.    Individual level documentation: Patient completed group from sitting level. Patientt needed supervision to complete various dance moves with cues to maintain back precautions.  Patient able to implement her own modifications during group.  Pain: 0/10  Precautions:  Back, TLSO, Falls   Brittian Renaldo A Divine Hansley 08/25/2022, 12:54 PM

## 2022-08-25 NOTE — Progress Notes (Signed)
Physical Therapy Session Note  Patient Details  Name: Katrina Rivera MRN: [id number] Date of Birth: 17-Jan-1946  Today's Date: 08/25/2022 PT Individual Time: 0920-1015, 4098-1191  PT Individual Time Calculation (min): 55 min , 36 min   Short Term Goals: Week 1:  PT Short Term Goal 1 (Week 1): STG=LTG 2/2 ELOS.  Skilled Therapeutic Interventions/Progress Updates:      Therapy Documentation Precautions:  Precautions Precautions: Fall, Back Precaution Booklet Issued: No Precaution Comments: recalled 2/3 from prior surgeries, pt with 4 falls since last surgery Required Braces or Orthoses: Spinal Brace Spinal Brace: Applied in sitting position, Lumbar corset Restrictions Weight Bearing Restrictions: No   Treatment Session 1:   Pt received semi-reclined in bed and without reports of pain in session. Pt requests to brush teeth, comb hair and wash face. Pt (S) with bed mobility and ambulatory transfer to sink and set-up for grooming tasks.   Pt reports she plans to purchase handrail from [facility] and PT provided recommendation for type as pt has 8 inch step and would have difficulty ascending with RW and would benefit from hand rail.   Pt ambulated to main gym with RW (S) and requires CGA for navigation of 8 inch step with R HR with cues of sequencing.   Pt transported to room and left seated in w/c at bedside with all needs in reach.    Treatment Session 2:   Pt received semi-reclined in bed requesting assistance for toileting. Pt without reports of pain in session and requires (S) for bed mobility, ambulation to restroom and toileting (3/3). Pt returned to sink and performed oral care and returned to bed. Pt engaged in discussion with PT regarding mental state as pt reports increased anxiety about her decreased mobility and reliance on others. PT utilized therapeutic use of self to provide support and encouragement. PT discussed mindfulness and mindset re framing techniques to improve  outlook. Pt receptive and reported it felt good to discuss with therapist. Pt left in bed with all needs in reach and alarm on.   Therapy/Group: Individual Therapy  Truitt Leep Truitt Leep PT, DPT  08/25/2022, 5:40 AM

## 2022-08-25 NOTE — Progress Notes (Signed)
Occupational Therapy Session Note  Patient Details  Name: LEONILDA COZBY MRN: [id number] Date of Birth: October 25, 1945  Today's Date: 08/25/2022 OT Individual Time: 6440-3474 OT Individual Time Calculation (min): 42 min    Short Term Goals: Week 1:  OT Short Term Goal 1 (Week 1): STG=LTG's based on LOS  Skilled Therapeutic Interventions/Progress Updates:    Pt resting in bed upon arrival and agreeable to therapy. Supine>sit EOB with supervision using bed rails. Pt required assistance removing socks and donning shoes without AE. Pt required assistance donning LSO. OT intervention with focus on functional amb with RW and standing balance. Pt amb with RW to day room with CGA. Standing activity with corn hole-CGA. Pt stood at table to clean all bean bags without UE support-CGA. Pt amb back to room and returned to EOB. Sit>supine with supervision. Pt remained in bed awaiting PT. All needs within reach. Bed alarm activated.   Therapy Documentation Precautions:  Precautions Precautions: Fall, Back Precaution Booklet Issued: No Precaution Comments: recalled 2/3 from prior surgeries, pt with 4 falls since last surgery Required Braces or Orthoses: Spinal Brace Spinal Brace: Applied in sitting position, Lumbar corset Restrictions Weight Bearing Restrictions: No Pain: Pt c/o 3/10 back pain; meds admin prior to therapy and repositioned at end of session   Therapy/Group: Individual Therapy  Rich Brave 08/25/2022, 2:26 PM

## 2022-08-26 NOTE — Progress Notes (Signed)
PROGRESS NOTE   Subjective/Complaints:   Pt asking how long TrP injections last and if can do them "all over"-   Also c/o "falling backwards"in bed- doesn't know why- not sur eif it's new or not.   Low back hurting- really painful- thinks has knots in low back as well as neck- wondering if can inject for her.   Notes that injections yesterday "tooka load off her shoulders" feels so much better.   Was so worried last night- thought had heart palpitations, but by the time nursing saw, sounded OK.  ROS:     Pt denies SOB, abd pain, CP, N/V/C/D, and vision changes  Except for HPI  Objective:   No results found. No results for input(s): "WBC", "HGB", "HCT", "PLT" in the last 72 hours.   No results for input(s): "NA", "K", "CL", "CO2", "GLUCOSE", "BUN", "CREATININE", "CALCIUM" in the last 72 hours.   Intake/Output Summary (Last 24 hours) at 08/26/2022 0903 Last data filed at 08/26/2022 0803 Gross per 24 hour  Intake 920 ml  Output --  Net 920 ml        Physical Exam: Vital Signs Blood pressure (!) 140/60, pulse 72, temperature 98 F (36.7 C), temperature source Oral, resp. rate 18, height 5' (1.524 m), weight 51.3 kg, SpO2 99%.       General: awake, alert, appropriate, sitting up in bed- but fell back somewhat 2 different times onto pillows propped behind her; NAD HENT: conjugate gaze; oropharynx moist CV: regular rate and rhythm; no JVD Pulmonary: CTA B/L; no W/R/R- good air movement GI: soft, NT, ND, (+)BS Psychiatric: appropriate- anxious.worried Neurological: tangential- still hard ot keep on track but better than yesterday MS- very tight in upper back, shoulders and neck esp Ext: no clubbing, cyanosis, or edema Psych: pleasant and cooperative  Skin: back incision with honeycomb dressing--no change in area where there's old blood at superior aspect of incision. Neuro:  Alert and oriented x 3. Normal  insight and awareness. Intact Memory. Normal language and speech. Cranial nerve exam unremarkable. MMT: UE 5/5. LE 3 to 3+ HF, KE and 4- to 4/5 ADF/PF. Decreased LT below knees and in fingers. DTR's tr to 1+. .   Musculoskeletal:  LB TTP and with bed mobility   Assessment/Plan: 1. Functional deficits which require 3+ hours per day of interdisciplinary therapy in a comprehensive inpatient rehab setting. Physiatrist is providing close team supervision and 24 hour management of active medical problems listed below. Physiatrist and rehab team continue to assess barriers to discharge/monitor patient progress toward functional and medical goals  Care Tool:  Bathing    Body parts bathed by patient: Right arm, Left arm, Chest, Abdomen, Front perineal area, Buttocks, Right upper leg, Left upper leg, Right lower leg, Left lower leg, Face   Body parts bathed by helper: Right lower leg, Left lower leg, Buttocks     Bathing assist Assist Level: Minimal Assistance - Patient > 75%     Upper Body Dressing/Undressing Upper body dressing   What is the patient wearing?: Pull over shirt, Orthosis    Upper body assist Assist Level: Minimal Assistance - Patient > 75%    Lower Body Dressing/Undressing  Lower body dressing      What is the patient wearing?: Underwear/pull up, Pants     Lower body assist Assist for lower body dressing: Moderate Assistance - Patient 50 - 74%     Toileting Toileting    Toileting assist Assist for toileting: Minimal Assistance - Patient > 75%     Transfers Chair/bed transfer  Transfers assist     Chair/bed transfer assist level: Minimal Assistance - Patient > 75%     Locomotion Ambulation   Ambulation assist      Assist level: Minimal Assistance - Patient > 75% Assistive device: Walker-rolling Max distance: 150   Walk 10 feet activity   Assist     Assist level: Minimal Assistance - Patient > 75% Assistive device: Walker-rolling   Walk 50  feet activity   Assist    Assist level: Minimal Assistance - Patient > 75% Assistive device: Walker-rolling    Walk 150 feet activity   Assist    Assist level: Minimal Assistance - Patient > 75% Assistive device: Walker-rolling    Walk 10 feet on uneven surface  activity   Assist Walk 10 feet on uneven surfaces activity did not occur: Safety/medical concerns         Wheelchair     Assist Is the patient using a wheelchair?: Yes Type of Wheelchair: Manual    Wheelchair assist level: Supervision/Verbal cueing Max wheelchair distance: 150    Wheelchair 50 feet with 2 turns activity    Assist        Assist Level: Supervision/Verbal cueing   Wheelchair 150 feet activity     Assist      Assist Level: Supervision/Verbal cueing   Blood pressure (!) 140/60, pulse 72, temperature 98 F (36.7 C), temperature source Oral, resp. rate 18, height 5' (1.524 m), weight 51.3 kg, SpO2 99%.  Medical Problem List and Plan: 1. Functional deficits secondary to L3 fx s/p L2-L4 fixation and removal of previous hardware.              -hx also significant for relaxing-remitting MS             -patient may shower             -ELOS/Goals: 7-10 days, mod I to supervision goals with PT and OT   D/c 7/22  Con't CIR PT and OT Can have f/u me after d/c so can get more trigger point injections- will need to let clinic know for Trp injections Thrombotics: -DVT/anticoagulation:  Mechanical:  Antiembolism stockings, knee (TED hose) Bilateral lower extremities>>pt walking 120' with therapies and bruises A LOT. Will stay with TEDS and activity OOB  -pt wants to avoid blood thinners             -antiplatelet therapy: none   7/17- pt concerned about brusing, but explained don't have a way to fix- Decadron  increases the bruising.  3. Pain Management/FMS/peripheral neuropathy/RLS: Tylenol, Norco, Zanaflex as needed             -continue gabapentin 900 mg q HS             -have  discussed meds for RLS with pt. She pays $100 a month for horizant which she takes on top of gabapentin. She hasn't noticed a big change since she's been without while in hospital. Is interested in stopping it. I told her that we could add 300mg  of gabapentin in the am or gabapentin 300mg  bid in horizant's place. May do just  fine with the 900mg  gabapentin at bedtime.  7/15- pt says pain ok this AM- 5/10 at worst 2/10 at rest/right now  7/16- more pain last night- therapy overdid it?- cont' regimen- will add  Gabapentin 300 mg qam- pain in neck - trP- will do injections Thursday  7/17- assured pt will do Trp injections tomorrow for HA's/neck pain  7/18- did trigger point injections- pain dropped from "10/10 to 5/10".   7/19- pain in back still bad, but neck/shoulders almost gone for now 4. Mood/Behavior/Sleep: LCSW to evaluate and provide emotional support             -antipsychotic agents: n/a             -continue Cymbalta 60 mg daily             -clonazepam 0.5 mg prn anxiety             -hx of migraine HA>>on erenumab injection monthly, Nurtec as needed   7/17- pt feels monthly shot could be cause of her dizziness/ BP issues  7/18- it's due ot 7/30- told pt to call Neurologist and d/w them holding off on giving self CGRP shot on 7/30 5. Neuropsych/cognition: This patient is capable of making decisions on her own behalf.   6. Skin/Wound Care: Routine skin care checks             -observe surgical incision for increased drainage 7. Fluids/Electrolytes/Nutrition: Routine Is and Os and follow-up chemistries on Monday             -continue acidophilus, vitamin C, B-complex, D3   8: Hypotension: monitor TID and prn             -continue Florinef   7/15- BP 150s systolic this AM- laying down- will monitor for trend  7/16- BP elevated this AM- thinks due to receiving Flornief late- explained usually Florinef treats LOW BP.   7/17-BP running 140s-150s or higher, however drops 30 points when stands  up- don't feel comfortable adding BP meds when dropping so much- ON tiny dose of Flornef for Orthostatic hypotension that she's having.    7/18- BP running 160s-170s- will add Norvasc 2.5 mg daily for now- d/w pt  7/19- BP 140/60 this AM- doing slightly better- con't regimen- explained won't stop Florinef for now 9: Anemia: continue oral iron and follow-up CBC   10: IBS: continue acidophilus and dicyclomine             -Linzess as needed for constipation--pt doesn't like to use because it causes urgent blowouts. Holding for now. Prefers to use miralax                         -continue senokot-s and miralax scheduled                         -have miralax prn available also   -last bm 7/13 without any extra medication   7/15- LBM this AM- likes miralax.    7/16- LBM yesterday but very loose/almost liquid-wil reduce Senna to 1 tab/day and change miralax to every other day-   7/17- LBM yesterday- still loose- explained will take new regimen time to kick in since changed yesterday 12. Neurogenic bladder d/t MS             -tends to retain             -first PVR 488 this morning             -  continue uroxatral             -oob to void             -uses keflex every other day as prophylaxis against UTI's   7/15- stable- no issues right now, per pt.  13. Addison's disease             -resumed medrol:  4mg  am and 2mg  pm daily    7/18 Patient here for trigger point injections for  Consent done and on chart.  Cleaned areas with alcohol and injected using a 27 gauge 1.5 inch needle  Injected 3cc- none wasted Using 1% Lidocaine with no EPI  Upper traps B/L  Levators- B/L  Posterior scalenes Middle scalenes- B/L x2 Splenius Capitus Pectoralis Major- B/L  Rhomboids- B/L - upper Infraspinatus Teres Major/minor Thoracic paraspinals Lumbar paraspinals Other injections-    Patient's level of pain prior was 10/10 Current level of pain after injections is now 5/10 and dropping  There was  no bleeding or complications.  Patient was advised to drink a lot of water on day after injections to flush system  I spent a total of 35   minutes on total care today- >50% coordination of care- due to  Prlonged time in room with pt due to her concerns  LOS: 7 days A FACE TO FACE EVALUATION WAS PERFORMED  Kiing Deakin 08/26/2022, 9:03 AM

## 2022-08-26 NOTE — Progress Notes (Signed)
Physical Therapy Session Note  Patient Details  Name: Katrina Rivera MRN: [id number] Date of Birth: 11/17/1945  Today's Date: 08/26/2022 PT Individual Time: 0800-0900, 1500-1530 PT Individual Time Calculation (min): 60 min, 30 min   Short Term Goals: Week 1:  PT Short Term Goal 1 (Week 1): STG=LTG 2/2 ELOS.  Skilled Therapeutic Interventions/Progress Updates:    Session 1: Pt recd in w/c at sink performing morning grooming tasks. Donned ted hose and shoes with assist.   Pt reports up to 5/10 with mobility, rest and positioning as needed.   Pt ambulated with RW throughout session with CGA. Cues throughout for increased LLE foot clearance and normalized gait pattern. Adjusted RW to more appropriate height, with noted improvement in upright posture.   Pt navigated stairs with 2 handrails x 12 with CGA for endurance and functional mobility, discussed step to pattern. Pt demoes good safety but will benefit from continued practice.   Pt returned to room and remained in w/c, was left with all needs in reach and alarm active.   Session 2:  Pt seated in w/c on arrival and agreeable to therapy. Pt reports up to 5/10 with mobility, rest and positioning as needed.   Pt ambulated with RW and CGA throughout session, continued cueing for foot clearance.   Pt utilized nustep x 12 min at level 4 then level 6 for last several minutes for increased challenge and global strength and conditioning.   Pt returned to room and to bed with supervision bed mobiltiy and cues for back precautions, was left with all needs in reach and alarm active.   Therapy Documentation Precautions:  Precautions Precautions: Fall, Back Precaution Booklet Issued: No Precaution Comments: recalled 2/3 from prior surgeries, pt with 4 falls since last surgery Required Braces or Orthoses: Spinal Brace Spinal Brace: Applied in sitting position, Lumbar corset Restrictions Weight Bearing Restrictions: No General:       Therapy/Group: Individual Therapy  Juluis Rainier 08/26/2022, 3:17 PM

## 2022-08-26 NOTE — Progress Notes (Signed)
Occupational Therapy Session Note  Patient Details  Name: Katrina Rivera MRN: [id number] Date of Birth: 07-Dec-1945  Today's Date: 08/26/2022 OT Individual Time: 1003-1100 OT Individual Time Calculation (min): 57 min  Session 2:  OT Individual Time: 1003-1100 OT Individual Time Calculation (min): 57 min   Short Term Goals: Week 1:  OT Short Term Goal 1 (Week 1): STG=LTG's based on LOS  Skilled Therapeutic Interventions/Progress Updates:     Pt received sitting up in wc with TR, Katrina Rivera, present in room. Pt presenting to be in good spirits receptive to skilled OT session reporting 5/10 pain- OT offering intermittent rest breaks, repositioning, and therapeutic support to optimize participation in therapy session. Notified RN with Pt requesting pain medications for back. Direct hand off from TR to OT for therapy session. Pt wearing LSO upon OT arrival. Pt requesting to take shower this session. Focused on AE education and modifications to maintain precautions during BADLs. Pt completed functional mobility using RW to bathroom and transferred to TTB in walk-in shower with close supervision. Pt doffed shirt seated supervision and pants close supervision using grab bars for balance. Covered Pt's back incision prior to shower. Pt able to complete U/LB bathing while seated on TTB using long-handled sponge to wash back and lower BLEs/feet with supervision. Pt stood to wash bottom supervision while holding grab bars without LOB noted. Pt able to maintain precautions with min verbal cues during bathing. Pt completed functional mobility back to room using RW close supervision and completed dressing tasks while seated in wc. Donned bra, shirt, and LSO with supervision. Pt able to utilize reacher to weave feet into pants with min verbal cues provided for technique. Stood using RW close supervision to bring pants to waist. Pt stood at sink using RW to complete grooming/hygiene tasks for increased balance challenge  with Pt able to complete with close supervision. Pt was left resting in wc with call bell in reach, seat belt alarm on, and all needs met.    PM Session:  Pt received sitting up in wc presenting to be in good spirits receptive to skilled OT session reporting 0/10 pain- OT offering intermittent rest breaks, repositioning, and therapeutic support to optimize participation in therapy session. Focus this session patient education in preparation for d/c. Education provided on energy conservation techniques, home safety/modifications, and fall prevention. Provided Pt handouts regarding each of these topics to support learning and improved carry over. Engaged Pt in discussion of practical ways to implement techniques or modify her home to increase safety, prevent falls, and optimize participation with Pt actively participating in discussion and verbalizing strategies. Pt receptive to all education provided. Provided Pt a RW bag to allow increased independence when transporting items in her home with education and demonstration of purpose provided. Pt receptive to all education, verbalizing understanding, and stating all questions were met. Pt was left resting in wc with call bell in reach, seat belt alarm on, and all needs met.    Therapy Documentation Precautions:  Precautions Precautions: Fall, Back Precaution Booklet Issued: No Precaution Comments: recalled 2/3 from prior surgeries, pt with 4 falls since last surgery Required Braces or Orthoses: Spinal Brace Spinal Brace: Applied in sitting position, Lumbar corset Restrictions Weight Bearing Restrictions: No  Therapy/Group: Individual Therapy  Army Fossa 08/26/2022, 7:57 AM

## 2022-08-26 NOTE — Progress Notes (Signed)
Recreational Therapy Discharge Summary Patient Details  Name: Katrina Rivera MRN: [id number] Date of Birth: 10-Sep-1945 Today's Date: 08/26/2022   Comments on progress toward goals: Pt has made great progress during LOS and is scheduled for discharge home with family on 7/22.  TR sessions focused on activity analysis identifying potential modifications and stress management.  Pt provided with stress management education with handout provided.  Goal met.  Reasons goals not met: n/a  Equipment acquired: n/a  Reasons for discharge: discharge from hospital  Follow-up: Home Health  Patient/family agrees with progress made and goals achieved: Yes  My Rinke 08/26/2022, 12:57 PM

## 2022-08-26 NOTE — Progress Notes (Signed)
Recreational Therapy Session Note  Patient Details  Name: Katrina Rivera MRN: [id number] Date of Birth: May 29, 1945 Today's Date: 08/26/2022  Pain: no c/o Skilled Therapeutic Interventions/Progress Updates:  Pt will identify factors that contribute to stress with min questioning cues.  MET After discussion, pt will identify 2 coping strategies to assist with stress management.  MET Pt will be provided with handout on stress management and coping strategies for home home use.  MET  Pt participated in stress managment/coping discussion today.  Pt education/discussion focused on stress exploration including factors that contribute to stress, factors that protect against stress and potential coping strategies.  Coping strategies included deep breathing, progressive muscle relaxation, imagery & challenging irrational thoughts.  Handouts provided.  Pt appreciative of the session.  Therapy/Group: Individual Therapy  Rye Decoste 08/26/2022, 12:28 PM

## 2022-08-27 DIAGNOSIS — S32039A Unspecified fracture of third lumbar vertebra, initial encounter for closed fracture: Secondary | ICD-10-CM

## 2022-08-27 NOTE — Progress Notes (Signed)
Occupational Therapy Discharge Summary  Patient Details  Name: ADLAI NIEBLAS MRN: [id number] Date of Birth: 1945/07/17  Date of Discharge from OT service:August 28, 2022  {CHL IP REHAB OT TIME CALCULATIONS:[id number]}   Patient has met 10 of 10 long term goals due to improved activity tolerance, improved balance, and postural control.  Patient to discharge at overall Supervision level.  Patient's care partner is independent to provide the necessary physical assistance at discharge.    Reasons goals not met: N/A  Recommendation:  Patient will benefit from ongoing skilled OT services in home health setting to continue to advance functional skills in the area of BADL and Reduce care partner burden.  Equipment: No equipment provided  Reasons for discharge: treatment goals met and discharge from hospital  Patient/family agrees with progress made and goals achieved: Yes  OT Discharge Precautions/Restrictions  Precautions Precautions: Fall;Back Required Braces or Orthoses: Spinal Brace Spinal Brace: Applied in sitting position;Lumbar corset Restrictions Weight Bearing Restrictions: No Vital Signs Therapy Vitals Temp: 97.8 F (36.6 C) Temp Source: Oral Pulse Rate: 81 Resp: 18 BP: 127/74 Patient Position (if appropriate): Lying Oxygen Therapy SpO2: 99 % O2 Device: Room Air Pain  *** ADL ADL Equipment Provided: Long-handled shoe horn, Long-handled sponge Eating: Set up Where Assessed-Eating: Chair Grooming: Modified independent Where Assessed-Grooming: Sitting at sink Upper Body Bathing: Supervision/safety Where Assessed-Upper Body Bathing: Shower Lower Body Bathing: Supervision/safety Where Assessed-Lower Body Bathing: Shower Upper Body Dressing: Modified independent (Device) Where Assessed-Upper Body Dressing: Wheelchair Lower Body Dressing: Supervision/safety Where Assessed-Lower Body Dressing: Sitting at sink, Standing at sink Toileting:  Supervision/safety Where Assessed-Toileting: Toilet, Psychiatrist Transfer: Close supervision Toilet Transfer Method: Proofreader: Gaffer: Close supervison Web designer Method: Ship broker: Insurance underwriter: Close supervision Film/video editor Method: Designer, industrial/product: Information systems manager with back ADL Comments: min-mod A LB, min A/CGA UB, min A SPT with RW with posterior lean tendencies Vision Baseline Vision/History: 1 Wears glasses Patient Visual Report: No change from baseline Vision Assessment?: No apparent visual deficits Perception  Perception: Within Functional Limits Praxis Praxis: Intact Cognition *** Cognition Overall Cognitive Status: Within Functional Limits for tasks assessed Arousal/Alertness: Awake/alert Orientation Level: Person;Place;Situation Memory: Impaired Memory Impairment: Decreased recall of new information (at baseline) Awareness: Appears intact Problem Solving: Appears intact Safety/Judgment: Appears intact Sensation *** Sensation Hot/Cold: Appears Intact Proprioception: Appears Intact Stereognosis: Appears Intact Motor  Motor Motor: Ataxia Motor - Discharge Observations: decreased ataxia noted compared to admission Mobility  Bed Mobility Bed Mobility: Right Sidelying to Sit;Sit to Sidelying Right;Sit to Supine;Supine to Sit Rolling Right: Independent with assistive device Rolling Left: Independent with assistive device Right Sidelying to Sit: Independent with assistive device Supine to Sit: Independent with assistive device Sit to Supine: Independent with assistive device Sit to Sidelying Right: Independent with assistive device Transfers Sit to Stand: Supervision/Verbal cueing Stand to Sit: Supervision/Verbal cueing  Trunk/Postural Assessment  Cervical Assessment Cervical Assessment: Within Functional  Limits Thoracic Assessment Thoracic Assessment: Exceptions to [facility] (rounded shoulders) Lumbar Assessment Lumbar Assessment: Exceptions to [facility] (lumbar corset)  Balance Balance Balance Assessed: Yes Static Sitting Balance Static Sitting - Balance Support: Feet supported Static Sitting - Level of Assistance: 6: Modified independent (Device/Increase time) Dynamic Sitting Balance Dynamic Sitting - Balance Support: Feet supported Dynamic Sitting - Level of Assistance: 6: Modified independent (Device/Increase time) Dynamic Sitting - Balance Activities: Reaching for objects;Forward lean/weight shifting Static Standing Balance Static Standing - Balance Support: During functional activity  Static Standing - Level of Assistance: 5: Stand by assistance Dynamic Standing Balance Dynamic Standing - Balance Support: During functional activity;Bilateral upper extremity supported Dynamic Standing - Level of Assistance: 5: Stand by assistance Dynamic Standing - Balance Activities: Reaching for objects;Reaching across midline Extremity/Trunk Assessment RUE Assessment RUE Assessment: Within Functional Limits LUE Assessment LUE Assessment: Within Functional Limits   Treatment session  "subjective***" Pt supine in bed upon OT arrival, agreeable to OT session. "subjective***" Pt seated in W/C upon OT arrival, agreeable to OT.  Bed mobility: Eating: Grooming: Oral hygiene: Toilet transfer: Toileting: UB dressing: LB dressing: Footwear: Shower transfer: Bathing: Transfers:   ***Pt seated in W/C at end of session with W/C alarm donned, call light within reach and 4Ps assessed.  ***Pt supine in bed with bed alarm activated, 2 bed rails up, call light within reach and 4Ps assessed.   Velia Meyer, OTD, OTR/L 08/27/2022, 8:11 PM

## 2022-08-27 NOTE — Progress Notes (Signed)
PROGRESS NOTE   Subjective/Complaints:  No events overnight. Vitals stable. PO 25-30% meals Last BM 7/20, continent.   ROS:     Pt denies SOB, abd pain, CP, N/V/C/D, and vision changes  Except for HPI  Objective:   No results found. No results for input(s): "WBC", "HGB", "HCT", "PLT" in the last 72 hours.   No results for input(s): "NA", "K", "CL", "CO2", "GLUCOSE", "BUN", "CREATININE", "CALCIUM" in the last 72 hours.   Intake/Output Summary (Last 24 hours) at 08/27/2022 1013 Last data filed at 08/27/2022 0727 Gross per 24 hour  Intake 696 ml  Output --  Net 696 ml        Physical Exam: Vital Signs Blood pressure 98/71, pulse 88, temperature 98.1 F (36.7 C), temperature source Oral, resp. rate 18, height 5' (1.524 m), weight 51.3 kg, SpO2 100%.       General: awake, alert, appropriate, sitting up in bed- but fell back somewhat 2 different times onto pillows propped behind her; NAD HENT: conjugate gaze; oropharynx moist CV: regular rate and rhythm; no JVD Pulmonary: CTA B/L; no W/R/R- good air movement GI: soft, NT, ND, (+)BS Psychiatric: appropriate- anxious.worried Neurological: tangential- still hard ot keep on track but better than yesterday MS- very tight in upper back, shoulders and neck esp Ext: no clubbing, cyanosis, or edema Psych: pleasant and cooperative  Skin: back incision with honeycomb dressing--no change in area where there's old blood at superior aspect of incision. Neuro:  Alert and oriented x 3. Normal insight and awareness. Intact Memory. Normal language and speech. Cranial nerve exam unremarkable. MMT: UE 5/5. LE 3 to 3+ HF, KE and 4- to 4/5 ADF/PF. Decreased LT below knees and in fingers. DTR's tr to 1+. .   Musculoskeletal:  LB TTP and with bed mobility   Assessment/Plan: 1. Functional deficits which require 3+ hours per day of interdisciplinary therapy in a comprehensive  inpatient rehab setting. Physiatrist is providing close team supervision and 24 hour management of active medical problems listed below. Physiatrist and rehab team continue to assess barriers to discharge/monitor patient progress toward functional and medical goals  Care Tool:  Bathing    Body parts bathed by patient: Right arm, Left arm, Chest, Abdomen, Front perineal area, Buttocks, Right upper leg, Left upper leg, Right lower leg, Left lower leg, Face   Body parts bathed by helper: Right lower leg, Left lower leg, Buttocks     Bathing assist Assist Level: Minimal Assistance - Patient > 75%     Upper Body Dressing/Undressing Upper body dressing   What is the patient wearing?: Pull over shirt, Orthosis    Upper body assist Assist Level: Minimal Assistance - Patient > 75%    Lower Body Dressing/Undressing Lower body dressing      What is the patient wearing?: Underwear/pull up, Pants     Lower body assist Assist for lower body dressing: Moderate Assistance - Patient 50 - 74%     Toileting Toileting    Toileting assist Assist for toileting: Minimal Assistance - Patient > 75%     Transfers Chair/bed transfer  Transfers assist     Chair/bed transfer assist level: Minimal Assistance - Patient >  75%     Locomotion Ambulation   Ambulation assist      Assist level: Minimal Assistance - Patient > 75% Assistive device: Walker-rolling Max distance: 150   Walk 10 feet activity   Assist     Assist level: Minimal Assistance - Patient > 75% Assistive device: Walker-rolling   Walk 50 feet activity   Assist    Assist level: Minimal Assistance - Patient > 75% Assistive device: Walker-rolling    Walk 150 feet activity   Assist    Assist level: Minimal Assistance - Patient > 75% Assistive device: Walker-rolling    Walk 10 feet on uneven surface  activity   Assist Walk 10 feet on uneven surfaces activity did not occur: Safety/medical  concerns         Wheelchair     Assist Is the patient using a wheelchair?: Yes Type of Wheelchair: Manual    Wheelchair assist level: Supervision/Verbal cueing Max wheelchair distance: 150    Wheelchair 50 feet with 2 turns activity    Assist        Assist Level: Supervision/Verbal cueing   Wheelchair 150 feet activity     Assist      Assist Level: Supervision/Verbal cueing   Blood pressure 98/71, pulse 88, temperature 98.1 F (36.7 C), temperature source Oral, resp. rate 18, height 5' (1.524 m), weight 51.3 kg, SpO2 100%.  Medical Problem List and Plan: 1. Functional deficits secondary to L3 fx s/p L2-L4 fixation and removal of previous hardware.              -hx also significant for relaxing-remitting MS             -patient may shower             -ELOS/Goals: 7-10 days, mod I to supervision goals with PT and OT   D/c 7/22  Con't CIR PT and OT Can have f/u me after d/c so can get more trigger point injections- will need to let clinic know for Trp injections Thrombotics: -DVT/anticoagulation:  Mechanical:  Antiembolism stockings, knee (TED hose) Bilateral lower extremities>>pt walking 120' with therapies and bruises A LOT. Will stay with TEDS and activity OOB  -pt wants to avoid blood thinners             -antiplatelet therapy: none   7/17- pt concerned about brusing, but explained don't have a way to fix- Decadron  increases the bruising.  3. Pain Management/FMS/peripheral neuropathy/RLS: Tylenol, Norco, Zanaflex as needed             -continue gabapentin 900 mg q HS             -have discussed meds for RLS with pt. She pays $100 a month for horizant which she takes on top of gabapentin. She hasn't noticed a big change since she's been without while in hospital. Is interested in stopping it. I told her that we could add 300mg  of gabapentin in the am or gabapentin 300mg  bid in horizant's place. May do just fine with the 900mg  gabapentin at bedtime.  7/15-  pt says pain ok this AM- 5/10 at worst 2/10 at rest/right now  7/16- more pain last night- therapy overdid it?- cont' regimen- will add  Gabapentin 300 mg qam- pain in neck - trP- will do injections Thursday  7/17- assured pt will do Trp injections tomorrow for HA's/neck pain  7/18- did trigger point injections- pain dropped from "10/10 to 5/10".   7/19- pain in back  still bad, but neck/shoulders almost gone for now 4. Mood/Behavior/Sleep: LCSW to evaluate and provide emotional support             -antipsychotic agents: n/a             -continue Cymbalta 60 mg daily             -clonazepam 0.5 mg prn anxiety             -hx of migraine HA>>on erenumab injection monthly, Nurtec as needed   7/17- pt feels monthly shot could be cause of her dizziness/ BP issues  7/18- it's due ot 7/30- told pt to call Neurologist and d/w them holding off on giving self CGRP shot on 7/30 5. Neuropsych/cognition: This patient is capable of making decisions on her own behalf.   6. Skin/Wound Care: Routine skin care checks             -observe surgical incision for increased drainage 7. Fluids/Electrolytes/Nutrition: Routine Is and Os and follow-up chemistries on Monday             -continue acidophilus, vitamin C, B-complex, D3   8: Hypotension: monitor TID and prn             -continue Florinef   7/15- BP 150s systolic this AM- laying down- will monitor for trend  7/16- BP elevated this AM- thinks due to receiving Flornief late- explained usually Florinef treats LOW BP.   7/17-BP running 140s-150s or higher, however drops 30 points when stands up- don't feel comfortable adding BP meds when dropping so much- ON tiny dose of Flornef for Orthostatic hypotension that she's having.    7/18- BP running 160s-170s- will add Norvasc 2.5 mg daily for now- d/w pt  7/19- BP 140/60 this AM- doing slightly better- con't regimen- explained won't stop Florinef for now 9: Anemia: continue oral iron and follow-up CBC   10:  IBS: continue acidophilus and dicyclomine             -Linzess as needed for constipation--pt doesn't like to use because it causes urgent blowouts. Holding for now. Prefers to use miralax                         -continue senokot-s and miralax scheduled                         -have miralax prn available also   -last bm 7/13 without any extra medication   7/15- LBM this AM- likes miralax.    7/16- LBM yesterday but very loose/almost liquid-wil reduce Senna to 1 tab/day and change miralax to every other day-   7/17- LBM yesterday- still loose- explained will take new regimen time to kick in since changed yesterday 12. Neurogenic bladder d/t MS             -tends to retain             -first PVR 488 this morning             -continue uroxatral             -oob to void             -uses keflex every other day as prophylaxis against UTI's   7/15- stable- no issues right now, per pt.  13. Addison's disease             -resumed medrol:  4mg  am and 2mg  pm daily    7/18 Patient here for trigger point injections for  Consent done and on chart.  Cleaned areas with alcohol and injected using a 27 gauge 1.5 inch needle  Injected 3cc- none wasted Using 1% Lidocaine with no EPI  Upper traps B/L  Levators- B/L  Posterior scalenes Middle scalenes- B/L x2 Splenius Capitus Pectoralis Major- B/L  Rhomboids- B/L - upper Infraspinatus Teres Major/minor Thoracic paraspinals Lumbar paraspinals Other injections-    Patient's level of pain prior was 10/10 Current level of pain after injections is now 5/10 and dropping  There was no bleeding or complications.  Patient was advised to drink a lot of water on day after injections to flush system  I spent a total of 35   minutes on total care today- >50% coordination of care- due to  Prlonged time in room with pt due to her concerns  LOS: 8 days A FACE TO FACE EVALUATION WAS PERFORMED  Angelina Sheriff 08/27/2022, 10:13 AM

## 2022-08-28 NOTE — Plan of Care (Signed)
  Problem: RH Balance Goal: LTG: Patient will maintain dynamic sitting balance (OT) Description: LTG:  Patient will maintain dynamic sitting balance with assistance during activities of daily living (OT) Outcome: Completed/Met Flowsheets (Taken 08/20/2022 0842 by Vicenta Dunning, OT) LTG: Pt will maintain dynamic sitting balance during ADLs with: Independent with assistive device Goal: LTG Patient will maintain dynamic standing with ADLs (OT) Description: LTG:  Patient will maintain dynamic standing balance with assist during activities of daily living (OT)  Outcome: Completed/Met Flowsheets (Taken 08/20/2022 0842 by Vicenta Dunning, OT) LTG: Pt will maintain dynamic standing balance during ADLs with: Supervision/Verbal cueing   Problem: Sit to Stand Goal: LTG:  Patient will perform sit to stand in prep for activites of daily living with assistance level (OT) Description: LTG:  Patient will perform sit to stand in prep for activites of daily living with assistance level (OT) Outcome: Completed/Met Flowsheets (Taken 08/20/2022 0842 by Vicenta Dunning, OT) LTG: PT will perform sit to stand in prep for activites of daily living with assistance level: Supervision/Verbal cueing   Problem: RH Grooming Goal: LTG Patient will perform grooming w/assist,cues/equip (OT) Description: LTG: Patient will perform grooming with assist, with/without cues using equipment (OT) Outcome: Completed/Met Flowsheets (Taken 08/20/2022 0842 by Vicenta Dunning, OT) LTG: Pt will perform grooming with assistance level of: Independent with assistive device    Problem: RH Bathing Goal: LTG Patient will bathe all body parts with assist levels (OT) Description: LTG: Patient will bathe all body parts with assist levels (OT) Outcome: Completed/Met Flowsheets (Taken 08/20/2022 0842 by Vicenta Dunning, OT) LTG: Pt will perform bathing with assistance level/cueing: Supervision/Verbal cueing   Problem: RH Dressing Goal: LTG Patient  will perform upper body dressing (OT) Description: LTG Patient will perform upper body dressing with assist, with/without cues (OT). Outcome: Completed/Met Flowsheets (Taken 08/20/2022 0842 by Vicenta Dunning, OT) LTG: Pt will perform upper body dressing with assistance level of: Independent with assistive device Goal: LTG Patient will perform lower body dressing w/assist (OT) Description: LTG: Patient will perform lower body dressing with assist, with/without cues in positioning using equipment (OT) Outcome: Completed/Met Flowsheets (Taken 08/20/2022 0842 by Vicenta Dunning, OT) LTG: Pt will perform lower body dressing with assistance level of: Supervision/Verbal cueing   Problem: RH Toileting Goal: LTG Patient will perform toileting task (3/3 steps) with assistance level (OT) Description: LTG: Patient will perform toileting task (3/3 steps) with assistance level (OT)  Outcome: Completed/Met Flowsheets (Taken 08/20/2022 0842 by Vicenta Dunning, OT) LTG: Pt will perform toileting task (3/3 steps) with assistance level: Supervision/Verbal cueing   Problem: RH Toilet Transfers Goal: LTG Patient will perform toilet transfers w/assist (OT) Description: LTG: Patient will perform toilet transfers with assist, with/without cues using equipment (OT) Outcome: Completed/Met Flowsheets (Taken 08/20/2022 0842 by Vicenta Dunning, OT) LTG: Pt will perform toilet transfers with assistance level of: Supervision/Verbal cueing   Problem: RH Tub/Shower Transfers Goal: LTG Patient will perform tub/shower transfers w/assist (OT) Description: LTG: Patient will perform tub/shower transfers with assist, with/without cues using equipment (OT) Outcome: Completed/Met Flowsheets (Taken 08/20/2022 0842 by Vicenta Dunning, OT) LTG: Pt will perform tub/shower stall transfers with assistance level of: Supervision/Verbal cueing

## 2022-08-28 NOTE — Progress Notes (Signed)
Physical Therapy Discharge Summary  Patient Details  Name: Katrina Rivera MRN: [id number] Date of Birth: July 14, 1945  Date of Discharge from PT service:August 28, 2022  Today's Date: 08/28/2022 PT Individual Time: 1300-1405 PT Individual Time Calculation (min): 65 min    Patient has met 12 of 12 long term goals due to improved activity tolerance, improved balance, improved postural control, increased strength, increased range of motion, decreased pain, ability to compensate for deficits, and improved coordination.  Patient to discharge at an ambulatory level Modified Independent.    Reasons goals not met: N/A   Recommendation:  Patient will benefit from ongoing skilled PT services in home health setting to continue to advance safe functional mobility, address ongoing impairments in ataxia, balance, coordination, activity tolerance, strength, range of motion, and minimize fall risk.  Equipment: No equipment provided  Reasons for discharge: treatment goals met and discharge from hospital  Patient/family agrees with progress made and goals achieved: Yes  PT Discharge Pain Interference Pain Interference Pain Effect on Sleep: 2. Occasionally Pain Interference with Therapy Activities: 2. Occasionally Pain Interference with Day-to-Day Activities: 2. Occasionally Cognition Overall Cognitive Status: Within Functional Limits for tasks assessed Arousal/Alertness: Awake/alert Orientation Level: Oriented X4 Memory: Impaired Awareness: Appears intact Problem Solving: Appears intact Safety/Judgment: Appears intact Sensation Sensation Light Touch: Impaired by gross assessment Proprioception: Appears Intact Coordination Gross Motor Movements are Fluid and Coordinated: Yes Fine Motor Movements are Fluid and Coordinated: No Coordination and Movement Description: grossly uncoordianted, mild ataxia Finger Nose Finger Test: intention tremor present Heel Shin Test: decreased speed, limited  range of motion and strength Motor  Motor Motor: Ataxia Motor - Skilled Clinical Observations: mild ataxia, decreased compared to admission  Mobility Bed Mobility Bed Mobility: Right Sidelying to Sit;Sit to Sidelying Right;Sit to Supine;Supine to Sit Rolling Right: Independent with assistive device Rolling Left: Independent with assistive device Right Sidelying to Sit: Independent with assistive device Supine to Sit: Independent with assistive device Sit to Supine: Independent with assistive device Sit to Sidelying Right: Independent with assistive device Transfers Transfers: Sit to Stand;Stand to Sit;Stand Pivot Transfers Sit to Stand: Independent with assistive device Stand to Sit: Independent with assistive device Stand Pivot Transfers: Independent with assistive device Transfer (Assistive device): Rolling walker Locomotion  Gait Ambulation: Yes Gait Assistance: Independent with assistive device Gait Distance (Feet): 300 Feet Assistive device: Rolling walker Gait Assistance Details: Verbal cues for safe use of DME/AE Gait Gait: Yes Gait Pattern: Impaired Gait Pattern: Poor foot clearance - left Gait velocity: decreased Stairs / Additional Locomotion Stairs: Yes Stairs Assistance: Supervision/Verbal cueing Stair Management Technique: Two rails Number of Stairs: 12 Height of Stairs: 6 Ramp: Supervision/Verbal cueing Curb: Supervision/Verbal cueing Wheelchair Mobility Wheelchair Mobility: No  Trunk/Postural Assessment  Cervical Assessment Cervical Assessment: Within Functional Limits Thoracic Assessment Thoracic Assessment: Within Functional Limits (rounded shoulders) Lumbar Assessment Lumbar Assessment: Exceptions to [facility] (lumbar corset) Postural Control Postural Control: Within Functional Limits  Balance Static Sitting Balance Static Sitting - Balance Support: Feet supported Static Sitting - Level of Assistance: 6: Modified independent (Device/Increase  time) Dynamic Sitting Balance Dynamic Sitting - Balance Support: Feet supported Dynamic Sitting - Level of Assistance: 6: Modified independent (Device/Increase time) Dynamic Sitting - Balance Activities: Reaching for objects;Forward lean/weight shifting Static Standing Balance Static Standing - Balance Support: During functional activity Static Standing - Level of Assistance: 6: Modified independent (Device/Increase time) Dynamic Standing Balance Dynamic Standing - Balance Support: During functional activity;Bilateral upper extremity supported Dynamic Standing - Level of Assistance: 6: Modified independent (  Device/Increase time) Dynamic Standing - Balance Activities: Reaching for objects;Reaching across midline Extremity Assessment  RLE Assessment RLE Assessment: Exceptions to [facility] General Strength Comments: grossly 4-/5 except hip flexion 3+/5 LLE Assessment LLE Assessment: Exceptions to [facility] General Strength Comments: grossly 4-/5 except hip flexion 3+/5  PT Treatment Session:   Pt received in care of NT and agreeable to PT session. Pt with unrated back and L LE pain and PT provided education of flexibility exercises for relief. PT assessed pain interference, sensation, coordination, strength, and balance in preparation for discharge. Pt Mod I with transfers and gait >200 ft throughout session with RW. In main gym, PT instructed pt with mobility and strength exercises to address deficits, see listed below.   Access Code: [email or web address] URL: [email or web address] Date: 08/28/2022 Prepared by: Truitt Leep  Exercises - Ankle Dorsiflexion with Resistance  - 1 x daily - 7 x weekly - 3 sets - 10 reps - Gastroc Stretch on Wall  - 1 x daily - 7 x weekly - 3 sets - 10 reps - Supine Knee Extension Stretch on Towel Roll  - 1 x daily - 7 x weekly - Supine Quad Set  - 1 x daily - 7 x weekly - 3 sets - 10 reps   Pt returned to room and left seated in w/c at bedside with all needs in  reach and alarm on.   Truitt Leep Truitt Leep PT, DPT  08/28/2022, 1:21 PM

## 2022-08-28 NOTE — Progress Notes (Signed)
Inpatient Rehabilitation Discharge Medication Review by a Pharmacist  A complete drug regimen review was completed for this patient to identify any potential clinically significant medication issues.  High Risk Drug Classes Is patient taking? Indication by Medication  Antipsychotic {Receiving?:26196}   Anticoagulant {Receiving?:26196}   Antibiotic {Receiving?:26196}   Opioid {Receiving?:26196}   Antiplatelet {Receiving?:26196}   Hypoglycemics/insulin {Yes or No?:26198}   Vasoactive Medication {Receiving?:26196}   Chemotherapy {Receiving Chemo?:26197}   Other {Yes or No?:26198} Aimovig, Nurtec- migraine tx/ppx Alfuzosin - urinary retention Cephalexin - UTI ppx Vitamins, iron, MVI- supplement Duloxetine, clonazepam - mood/anxiety Linzess, dicyclomine - IBS Fludrocortisone - Addision's tx Fluticasone, Medrol - PRN allergies Gabapentin - neuropathy Norco - PRN pain *** Promethazine - PRN nausea/vomiting *** Tizanadine - PRN muscle spasms     Type of Medication Issue Identified Description of Issue Recommendation(s)  Drug Interaction(s) (clinically significant)     Duplicate Therapy     Allergy     No Medication Administration End Date     Incorrect Dose     Additional Drug Therapy Needed     Significant med changes from prior encounter (inform family/care partners about these prior to discharge).  Communicate medication changes with patient/family at discharge  Other       Clinically significant medication issues were identified that warrant physician communication and completion of prescribed/recommended actions by midnight of the next day:  {Yes or No?:26198}  Name of provider notified for urgent issues identified: ***   Provider Method of Notification: ***    Pharmacist comments: ***   Time spent performing this drug regimen review (minutes): 20   Thank you for allowing pharmacy to be a part of this patient's care.  Thelma Barge, PharmD Clinical  Pharmacist

## 2022-08-28 NOTE — Progress Notes (Signed)
PROGRESS NOTE   Subjective/Complaints:  No events overnight. Excited regarding DC tomorrow. Requesting smaller LSO brace if possible; current brace too tall and digs into her.  ROS:  Pt denies SOB, abd pain, CP, N/V/C/D, and vision changes  Except for HPI  Objective:   No results found. No results for input(s): "WBC", "HGB", "HCT", "PLT" in the last 72 hours.   No results for input(s): "NA", "K", "CL", "CO2", "GLUCOSE", "BUN", "CREATININE", "CALCIUM" in the last 72 hours.   Intake/Output Summary (Last 24 hours) at 08/28/2022 2256 Last data filed at 08/28/2022 1755 Gross per 24 hour  Intake 480 ml  Output --  Net 480 ml        Physical Exam: Vital Signs Blood pressure (!) 143/79, pulse 72, temperature 98.4 F (36.9 C), temperature source Oral, resp. rate 18, height 5' (1.524 m), weight 51.3 kg, SpO2 96%.       General: awake, alert, appropriate, sitting up in bed.  HENT: conjugate gaze; oropharynx moist CV: regular rate and rhythm; no JVD Pulmonary: CTA B/L; no W/R/R- good air movement GI: soft, NT, ND, (+)BS Psychiatric: appropriate- anxious.worried Neurological: tangential- still hard ot keep on track but better than yesterday MS- very tight in upper back, shoulders and neck esp Ext: no clubbing, cyanosis, or edema Psych: pleasant and cooperative  Skin: back incision with honeycomb dressing--no change in area where there's old blood at superior aspect of incision. + Palpable spinal cord stimulator unit to right of dressing + LSO brace  Neuro:  Alert and oriented x 3. Normal insight and awareness. Intact Memory. Normal language and speech. Cranial nerve exam unremarkable. MMT: UE 5/5. LE 3 to 3+ HF, KE and 4- to 4/5 ADF/PF. Decreased LT below knees and in fingers. DTR's tr to 1+. .    Musculoskeletal:  LB TTP - mild   Assessment/Plan: 1. Functional deficits which require 3+ hours per day of  interdisciplinary therapy in a comprehensive inpatient rehab setting. Physiatrist is providing close team supervision and 24 hour management of active medical problems listed below. Physiatrist and rehab team continue to assess barriers to discharge/monitor patient progress toward functional and medical goals  Care Tool:  Bathing    Body parts bathed by patient: Right arm, Left arm, Chest, Abdomen, Front perineal area, Buttocks, Right upper leg, Left upper leg, Right lower leg, Left lower leg, Face   Body parts bathed by helper: Right lower leg, Left lower leg, Buttocks     Bathing assist Assist Level: Supervision/Verbal cueing     Upper Body Dressing/Undressing Upper body dressing   What is the patient wearing?: Pull over shirt, Orthosis    Upper body assist Assist Level: Independent with assistive device    Lower Body Dressing/Undressing Lower body dressing      What is the patient wearing?: Underwear/pull up, Pants     Lower body assist Assist for lower body dressing: Supervision/Verbal cueing     Toileting Toileting    Toileting assist Assist for toileting: Supervision/Verbal cueing     Transfers Chair/bed transfer  Transfers assist     Chair/bed transfer assist level: Independent with assistive device     Locomotion Ambulation  Ambulation assist      Assist level: Independent with assistive device Assistive device: Walker-rolling Max distance: 200 ft   Walk 10 feet activity   Assist     Assist level: Independent with assistive device Assistive device: Walker-rolling   Walk 50 feet activity   Assist    Assist level: Independent with assistive device Assistive device: Walker-rolling    Walk 150 feet activity   Assist    Assist level: Independent with assistive device Assistive device: Walker-rolling    Walk 10 feet on uneven surface  activity   Assist Walk 10 feet on uneven surfaces activity did not occur: Safety/medical  concerns   Assist level: Supervision/Verbal cueing Assistive device: Walker-rolling   Wheelchair     Assist Is the patient using a wheelchair?: Yes Type of Wheelchair: Manual    Wheelchair assist level: Independent Max wheelchair distance: 150    Wheelchair 50 feet with 2 turns activity    Assist        Assist Level: Independent   Wheelchair 150 feet activity     Assist      Assist Level: Independent   Blood pressure (!) 143/79, pulse 72, temperature 98.4 F (36.9 C), temperature source Oral, resp. rate 18, height 5' (1.524 m), weight 51.3 kg, SpO2 96%.  Medical Problem List and Plan: 1. Functional deficits secondary to L3 fx s/p L2-L4 fixation and removal of previous hardware.              -hx also significant for relaxing-remitting MS             -patient may shower             -ELOS/Goals: 7-10 days, mod I to supervision goals with PT and OT   D/c 7/22  Con't CIR PT and OT   - Grounds pass with family   - Discussed with Nursing; placing [employer] eval for smaller LSO in AM, will hand off to AM nursing and try to obtain prior to discharge  Can have f/u me after d/c so can get more trigger point injections- will need to let clinic know for Trp injections Thrombotics: -DVT/anticoagulation:  Mechanical:  Antiembolism stockings, knee (TED hose) Bilateral lower extremities>>pt walking 120' with therapies and bruises A LOT. Will stay with TEDS and activity OOB  -pt wants to avoid blood thinners             -antiplatelet therapy: none   7/17- pt concerned about brusing, but explained don't have a way to fix- Decadron  increases the bruising.   3. Pain Management/FMS/peripheral neuropathy/RLS: Tylenol, Norco, Zanaflex as needed             -continue gabapentin 900 mg q HS             -have discussed meds for RLS with pt. She pays $100 a month for horizant which she takes on top of gabapentin. She hasn't noticed a big change since she's been without while in  hospital. Is interested in stopping it. I told her that we could add 300mg  of gabapentin in the am or gabapentin 300mg  bid in horizant's place. May do just fine with the 900mg  gabapentin at bedtime.  7/15- pt says pain ok this AM- 5/10 at worst 2/10 at rest/right now  7/16- more pain last night- therapy overdid it?- cont' regimen- will add  Gabapentin 300 mg qam- pain in neck - trP- will do injections Thursday  7/17- assured pt will do Trp injections tomorrow  for HA's/neck pain  7/18- did trigger point injections- pain dropped from "10/10 to 5/10".   7/19- pain in back still bad, but neck/shoulders almost gone for now  7/20-21: No complaints, stable  4. Mood/Behavior/Sleep: LCSW to evaluate and provide emotional support             -antipsychotic agents: n/a             -continue Cymbalta 60 mg daily             -clonazepam 0.5 mg prn anxiety             -hx of migraine HA>>on erenumab injection monthly, Nurtec as needed   7/17- pt feels monthly shot could be cause of her dizziness/ BP issues  7/18- it's due ot 7/30- told pt to call Neurologist and d/w them holding off on giving self CGRP shot on 7/30  5. Neuropsych/cognition: This patient is capable of making decisions on her own behalf.   6. Skin/Wound Care: Routine skin care checks             -observe surgical incision for increased drainage 7. Fluids/Electrolytes/Nutrition: Routine Is and Os and follow-up chemistries on Monday             -continue acidophilus, vitamin C, B-complex, D3   8: Hypotension: monitor TID and prn             -continue Florinef   7/15- BP 150s systolic this AM- laying down- will monitor for trend  7/16- BP elevated this AM- thinks due to receiving Flornief late- explained usually Florinef treats LOW BP.   7/17-BP running 140s-150s or higher, however drops 30 points when stands up- don't feel comfortable adding BP meds when dropping so much- ON tiny dose of Flornef for Orthostatic hypotension that she's  having.    7/18- BP running 160s-170s- will add Norvasc 2.5 mg daily for now- d/w pt  7/19- BP 140/60 this AM- doing slightly better- con't regimen- explained won't stop Florinef for now Normotensive 7/20-21    08/28/2022    9:59 PM 08/28/2022    7:19 PM 08/28/2022    3:02 PM  Vitals with BMI  Systolic 143 115 161  Diastolic 79 56 54  Pulse 72 77 77    9: Anemia: continue oral iron and follow-up CBC   10: IBS: continue acidophilus and dicyclomine             -Linzess as needed for constipation--pt doesn't like to use because it causes urgent blowouts. Holding for now. Prefers to use miralax                         -continue senokot-s and miralax scheduled                         -have miralax prn available also   -last bm 7/13 without any extra medication   7/15- LBM this AM- likes miralax.    7/16- LBM yesterday but very loose/almost liquid-wil reduce Senna to 1 tab/day and change miralax to every other day-   7/17- LBM yesterday- still loose- explained will take new regimen time to kick in since changed yesterday   - LBM 7/20  12. Neurogenic bladder d/t MS             -tends to retain             -first PVR 488 this morning             -  continue uroxatral             -oob to void             -uses keflex every other day as prophylaxis against UTI's   7/15-21 stable- no issues right now, per pt  13. Addison's disease             -resumed medrol:  4mg  am and 2mg  pm daily    LOS: 9 days A FACE TO FACE EVALUATION WAS PERFORMED  Katrina Rivera 08/28/2022, 10:56 PM

## 2022-08-29 ENCOUNTER — Other Ambulatory Visit (HOSPITAL_COMMUNITY): Payer: Self-pay

## 2022-08-29 LAB — CBC WITH DIFFERENTIAL/PLATELET
Abs Immature Granulocytes: 0.05 10*3/uL (ref 0.00–0.07)
Basophils Absolute: 0 10*3/uL (ref 0.0–0.1)
Basophils Relative: 1 %
Eosinophils Absolute: 0.1 10*3/uL (ref 0.0–0.5)
Eosinophils Relative: 1 %
HCT: 30.6 % — ABNORMAL LOW (ref 36.0–46.0)
Hemoglobin: 9.6 g/dL — ABNORMAL LOW (ref 12.0–15.0)
Immature Granulocytes: 1 %
Lymphocytes Relative: 22 %
Lymphs Abs: 1.4 10*3/uL (ref 0.7–4.0)
MCH: 32 pg (ref 26.0–34.0)
MCHC: 31.4 g/dL (ref 30.0–36.0)
MCV: 102 fL — ABNORMAL HIGH (ref 80.0–100.0)
Monocytes Absolute: 0.4 10*3/uL (ref 0.1–1.0)
Monocytes Relative: 7 %
Neutro Abs: 4.2 10*3/uL (ref 1.7–7.7)
Neutrophils Relative %: 68 %
Platelets: 302 10*3/uL (ref 150–400)
RBC: 3 MIL/uL — ABNORMAL LOW (ref 3.87–5.11)
RDW: 13.2 % (ref 11.5–15.5)
WBC: 6.1 10*3/uL (ref 4.0–10.5)
nRBC: 0 % (ref 0.0–0.2)

## 2022-08-29 LAB — COMPREHENSIVE METABOLIC PANEL
ALT: 21 U/L (ref 0–44)
AST: 23 U/L (ref 15–41)
Albumin: 3 g/dL — ABNORMAL LOW (ref 3.5–5.0)
Alkaline Phosphatase: 52 U/L (ref 38–126)
Anion gap: 5 (ref 5–15)
BUN: 9 mg/dL (ref 8–23)
CO2: 27 mmol/L (ref 22–32)
Calcium: 8.7 mg/dL — ABNORMAL LOW (ref 8.9–10.3)
Chloride: 107 mmol/L (ref 98–111)
Creatinine, Ser: 0.76 mg/dL (ref 0.44–1.00)
GFR, Estimated: >60 mL/min (ref >60)
Glucose, Bld: 117 mg/dL — ABNORMAL HIGH (ref 70–99)
Potassium: 3.4 mmol/L — ABNORMAL LOW (ref 3.5–5.1)
Sodium: 139 mmol/L (ref 135–145)
Total Bilirubin: 0.2 mg/dL — ABNORMAL LOW (ref 0.3–1.2)
Total Protein: 5.5 g/dL — ABNORMAL LOW (ref 6.5–8.1)

## 2022-08-29 MED ORDER — GABAPENTIN 300 MG PO CAPS: 900.0000 mg | ORAL_CAPSULE | Freq: Every day | ORAL | Status: DC

## 2022-08-29 MED ORDER — AMLODIPINE BESYLATE 2.5 MG PO TABS
2.5000 mg | ORAL_TABLET | Freq: Every day | ORAL | 0 refills | Status: AC
  Filled 2022-08-29: qty 30, 30d supply, fill #0

## 2022-08-29 MED ORDER — HYDROCODONE-ACETAMINOPHEN 7.5-325 MG PO TABS
1.0000 | ORAL_TABLET | ORAL | 0 refills | Status: DC | PRN
  Filled 2022-08-29: qty 30, 5d supply, fill #0

## 2022-08-29 MED ORDER — GABAPENTIN 300 MG PO CAPS: 300.0000 mg | ORAL_CAPSULE | Freq: Every day | ORAL | Status: DC

## 2022-08-29 MED ORDER — ACETAMINOPHEN 325 MG PO TABS: 325.0000 mg | ORAL_TABLET | ORAL | Status: AC | PRN

## 2022-08-29 NOTE — Progress Notes (Signed)
Orthopedic Tech Progress Note Patient Details:  Katrina Rivera 04-10-1945 [id number]  Micah Flesher to patient room to see what was going on. Called [employer] to let them know what was going on and [employer] does not make children LSO and if so it has to be fabricated. Removed the piece that was bothering patient with husband    Patient ID: [name], female   DOB: Oct 18, 1945, 77 y.o.   MRN: [id number]  Donald Pore 08/29/2022, 9:42 AM

## 2022-08-29 NOTE — Progress Notes (Addendum)
Inpatient Rehabilitation Care Coordinator Discharge Note   Patient Details  Name: Katrina Rivera MRN: [id number] Date of Birth: 10-04-1945   Discharge location: HOME WITH HUSBAND WHO CAN PROVIDE 24/7 SUPERVISION  Length of Stay: 10 days  Discharge activity level: MOD/I-SUPERVISION LEVEL  Home/community participation: ACTIVE  Patient response WU:[id number] Literacy - How often do you need to have someone help you when you read instructions, pamphlets, or other written material from your doctor or pharmacy?: Never  Patient response YN:WGNFAO Isolation - How often do you feel lonely or isolated from those around you?: Never  Services provided included: MD, RD, PT, OT, RN, CM, Pharmacy, SW  Financial Services:  Financial Services Utilized: [employer]  Choices offered to/list presented to: PT  Follow-up services arranged:  Outpatient, Patient/Family has no preference for HH/DME agencies    Outpatient Servicies: [facility] [address] [facility] OT HAS ALL DME AND WILL GET TUB BENCH AND HAND HELD SHOWER ON OWN      Patient response to transportation need: Is the patient able to respond to transportation needs?: Yes In the past 12 months, has lack of transportation kept you from medical appointments or from getting medications?: No In the past 12 months, has lack of transportation kept you from meetings, work, or from getting things needed for daily living?: No   Patient/Family verbalized understanding of follow-up arrangements:  Yes  Individual responsible for coordination of the follow-up plan: [phone]  Confirmed correct DME delivered: Lucy Chris 08/29/2022    Comments (or additional information):PT DID WELL AND PROGRESSED QUICKLY TOWARD HER GOALS.HUSBAND CAN PROVIDE SUPERVISION.  Summary of Stay    Date/Time Discharge Planning CSW  08/23/22 [phone] Home with husband who is able to assist. Pt doing well in her therapies and  will be short lenght of stay. [facility] already following from acute RGD       Javid Kemler, Lemar Livings

## 2022-08-29 NOTE — Progress Notes (Signed)
PROGRESS NOTE   Subjective/Complaints:  Pt says her LSO is too large- needs another.   TrP injections helped dizziness- a lot- so wants ot do again- also for her low back as well if Ok'd by [facility].  ROS:   Pt denies SOB, abd pain, CP, N/V/C/D, and vision changes  Except for HPI  Objective:   No results found. Recent Labs    08/29/22 0731  WBC 6.1  HGB 9.6*  HCT 30.6*  PLT 302     No results for input(s): "NA", "K", "CL", "CO2", "GLUCOSE", "BUN", "CREATININE", "CALCIUM" in the last 72 hours.   Intake/Output Summary (Last 24 hours) at 08/29/2022 0827 Last data filed at 08/29/2022 0745 Gross per 24 hour  Intake 597 ml  Output --  Net 597 ml        Physical Exam: Vital Signs Blood pressure (!) 142/70, pulse 78, temperature 97.7 F (36.5 C), temperature source Oral, resp. rate 18, height 5' (1.524 m), weight 51.3 kg, SpO2 99%.        General: awake, alert, appropriate, sitting up in bed; NAD HENT: conjugate gaze; oropharynx moist CV: regular rate and rhythm; no JVD Pulmonary: CTA B/L; no W/R/R- good air movement GI: soft, NT, ND, (+)BS Psychiatric: appropriate Neurological: Ox3 Skin- back incision-took honeycomb off- had spot of dried blood less than dime sized- otherwise steristrips in placed.  Ext: no clubbing, cyanosis, or edema Psych: pleasant and cooperative  Skin: back incision with honeycomb dressing--no change in area where there's old blood at superior aspect of incision. + Palpable spinal cord stimulator unit to right of dressing + LSO brace  Neuro:  Alert and oriented x 3. Normal insight and awareness. Intact Memory. Normal language and speech. Cranial nerve exam unremarkable. MMT: UE 5/5. LE 3 to 3+ HF, KE and 4- to 4/5 ADF/PF. Decreased LT below knees and in fingers. DTR's tr to 1+. .    Musculoskeletal:  LB TTP - mild   Assessment/Plan: 1. Functional deficits which require 3+ hours per  day of interdisciplinary therapy in a comprehensive inpatient rehab setting. Physiatrist is providing close team supervision and 24 hour management of active medical problems listed below. Physiatrist and rehab team continue to assess barriers to discharge/monitor patient progress toward functional and medical goals  Care Tool:  Bathing    Body parts bathed by patient: Right arm, Left arm, Chest, Abdomen, Front perineal area, Buttocks, Right upper leg, Left upper leg, Right lower leg, Left lower leg, Face   Body parts bathed by helper: Right lower leg, Left lower leg, Buttocks     Bathing assist Assist Level: Supervision/Verbal cueing     Upper Body Dressing/Undressing Upper body dressing   What is the patient wearing?: Pull over shirt, Orthosis    Upper body assist Assist Level: Independent with assistive device    Lower Body Dressing/Undressing Lower body dressing      What is the patient wearing?: Underwear/pull up, Pants     Lower body assist Assist for lower body dressing: Supervision/Verbal cueing     Toileting Toileting    Toileting assist Assist for toileting: Supervision/Verbal cueing     Transfers Chair/bed transfer  Transfers assist  Chair/bed transfer assist level: Independent with assistive device     Locomotion Ambulation   Ambulation assist      Assist level: Independent with assistive device Assistive device: Walker-rolling Max distance: 200 ft   Walk 10 feet activity   Assist     Assist level: Independent with assistive device Assistive device: Walker-rolling   Walk 50 feet activity   Assist    Assist level: Independent with assistive device Assistive device: Walker-rolling    Walk 150 feet activity   Assist    Assist level: Independent with assistive device Assistive device: Walker-rolling    Walk 10 feet on uneven surface  activity   Assist Walk 10 feet on uneven surfaces activity did not occur:  Safety/medical concerns   Assist level: Supervision/Verbal cueing Assistive device: Walker-rolling   Wheelchair     Assist Is the patient using a wheelchair?: Yes Type of Wheelchair: Manual    Wheelchair assist level: Independent Max wheelchair distance: 150    Wheelchair 50 feet with 2 turns activity    Assist        Assist Level: Independent   Wheelchair 150 feet activity     Assist      Assist Level: Independent   Blood pressure (!) 142/70, pulse 78, temperature 97.7 F (36.5 C), temperature source Oral, resp. rate 18, height 5' (1.524 m), weight 51.3 kg, SpO2 99%.  Medical Problem List and Plan: 1. Functional deficits secondary to L3 fx s/p L2-L4 fixation and removal of previous hardware.              -hx also significant for relaxing-remitting MS             -patient may shower             -ELOS/Goals: 7-10 days, mod I to supervision goals with PT and OT   D/c 7/22  Con't CIR PT and OT   - Grounds pass with family  D/c today-    Will need f/u with me Can have f/u me after d/c so can get more trigger point injections- will need to let clinic know for Trp injections Thrombotics: -DVT/anticoagulation:  Mechanical:  Antiembolism stockings, knee (TED hose) Bilateral lower extremities>>pt walking 120' with therapies and bruises A LOT. Will stay with TEDS and activity OOB  -pt wants to avoid blood thinners             -antiplatelet therapy: none   7/17- pt concerned about brusing, but explained don't have a way to fix- Decadron  increases the bruising.   3. Pain Management/FMS/peripheral neuropathy/RLS: Tylenol, Norco, Zanaflex as needed             -continue gabapentin 900 mg q HS             -have discussed meds for RLS with pt. She pays $100 a month for horizant which she takes on top of gabapentin. She hasn't noticed a big change since she's been without while in hospital. Is interested in stopping it. I told her that we could add 300mg  of gabapentin  in the am or gabapentin 300mg  bid in horizant's place. May do just fine with the 900mg  gabapentin at bedtime.  7/15- pt says pain ok this AM- 5/10 at worst 2/10 at rest/right now  7/16- more pain last night- therapy overdid it?- cont' regimen- will add  Gabapentin 300 mg qam- pain in neck - trP- will do injections Thursday  7/17- assured pt will do Trp injections tomorrow for  HA's/neck pain  7/18- did trigger point injections- pain dropped from "10/10 to 5/10".   7/19- pain in back still bad, but neck/shoulders almost gone for now  7/20-21: No complaints, stable  7/22- trP injections helped her dizziness as well so wants to redo them in future- will ask clinic to schedule her for them 4. Mood/Behavior/Sleep: LCSW to evaluate and provide emotional support             -antipsychotic agents: n/a             -continue Cymbalta 60 mg daily             -clonazepam 0.5 mg prn anxiety             -hx of migraine HA>>on erenumab injection monthly, Nurtec as needed   7/17- pt feels monthly shot could be cause of her dizziness/ BP issues  7/18- it's due ot 7/30- told pt to call Neurologist and d/w them holding off on giving self CGRP shot on 7/30  5. Neuropsych/cognition: This patient is capable of making decisions on her own behalf.   6. Skin/Wound Care: Routine skin care checks             -observe surgical incision for increased drainage 7. Fluids/Electrolytes/Nutrition: Routine Is and Os and follow-up chemistries on Monday             -continue acidophilus, vitamin C, B-complex, D3   8: Hypotension: monitor TID and prn             -continue Florinef   7/15- BP 150s systolic this AM- laying down- will monitor for trend  7/16- BP elevated this AM- thinks due to receiving Flornief late- explained usually Florinef treats LOW BP.   7/17-BP running 140s-150s or higher, however drops 30 points when stands up- don't feel comfortable adding BP meds when dropping so much- ON tiny dose of Flornef for  Orthostatic hypotension that she's having.    7/18- BP running 160s-170s- will add Norvasc 2.5 mg daily for now- d/w pt  7/19- BP 140/60 this AM- doing slightly better- con't regimen- explained won't stop Florinef for now Normotensive 7/20-21  7/22- willl send home on midl dose of Norvac 2.5 mg daily.     08/29/2022    4:34 AM 08/28/2022    9:59 PM 08/28/2022    7:19 PM  Vitals with BMI  Systolic 142 143 425  Diastolic 70 79 56  Pulse 78 72 77    9: Anemia: continue oral iron and follow-up CBC   10: IBS: continue acidophilus and dicyclomine             -Linzess as needed for constipation--pt doesn't like to use because it causes urgent blowouts. Holding for now. Prefers to use miralax                         -continue senokot-s and miralax scheduled                         -have miralax prn available also   -last bm 7/13 without any extra medication   7/15- LBM this AM- likes miralax.    7/16- LBM yesterday but very loose/almost liquid-wil reduce Senna to 1 tab/day and change miralax to every other day-   7/17- LBM yesterday- still loose- explained will take new regimen time to kick in since changed yesterday   - LBM 7/20  12.  Neurogenic bladder d/t MS             -tends to retain             -first PVR 488 this morning             -continue uroxatral             -oob to void             -uses keflex every other day as prophylaxis against UTI's   7/15-21 stable- no issues right now, per pt  13. Addison's disease             -resumed medrol:  4mg  am and 2mg  pm daily      I spent a total of  33  minutes on total care today- >50% coordination of care- due to  D/w pt about needing to follow up with her and doing more trigger point injections= also went over trigger finger- needs to see Ortho- can call them herself - gave her number for neurorehab. Also called charge nurse to get her the new LSO.    LOS: 10 days A FACE TO FACE EVALUATION WAS PERFORMED  Clay Solum 08/29/2022, 8:27 AM

## 2022-08-30 ENCOUNTER — Encounter: Payer: Self-pay | Admitting: Neurology

## 2022-08-31 DIAGNOSIS — M65331 Trigger finger, right middle finger: Secondary | ICD-10-CM | POA: Diagnosis not present

## 2022-09-06 DIAGNOSIS — I10 Essential (primary) hypertension: Secondary | ICD-10-CM | POA: Diagnosis not present

## 2022-09-06 DIAGNOSIS — M545 Low back pain, unspecified: Secondary | ICD-10-CM | POA: Diagnosis not present

## 2022-09-06 DIAGNOSIS — F419 Anxiety disorder, unspecified: Secondary | ICD-10-CM | POA: Diagnosis not present

## 2022-09-06 DIAGNOSIS — R110 Nausea: Secondary | ICD-10-CM | POA: Diagnosis not present

## 2022-09-06 DIAGNOSIS — M8000XS Age-related osteoporosis with current pathological fracture, unspecified site, sequela: Secondary | ICD-10-CM | POA: Diagnosis not present

## 2022-09-06 DIAGNOSIS — E2740 Unspecified adrenocortical insufficiency: Secondary | ICD-10-CM | POA: Diagnosis not present

## 2022-09-06 DIAGNOSIS — D509 Iron deficiency anemia, unspecified: Secondary | ICD-10-CM | POA: Diagnosis not present

## 2022-09-20 ENCOUNTER — Ambulatory Visit: Payer: PPO | Admitting: Occupational Therapy

## 2022-09-20 ENCOUNTER — Encounter: Payer: Self-pay | Admitting: Physical Therapy

## 2022-09-20 ENCOUNTER — Encounter: Payer: Self-pay | Admitting: Occupational Therapy

## 2022-09-20 ENCOUNTER — Ambulatory Visit: Payer: PPO | Attending: Physician Assistant | Admitting: Physical Therapy

## 2022-09-20 DIAGNOSIS — R269 Unspecified abnormalities of gait and mobility: Secondary | ICD-10-CM | POA: Insufficient documentation

## 2022-09-20 DIAGNOSIS — R262 Difficulty in walking, not elsewhere classified: Secondary | ICD-10-CM | POA: Diagnosis not present

## 2022-09-20 DIAGNOSIS — M6281 Muscle weakness (generalized): Secondary | ICD-10-CM | POA: Insufficient documentation

## 2022-09-20 DIAGNOSIS — R2689 Other abnormalities of gait and mobility: Secondary | ICD-10-CM | POA: Diagnosis not present

## 2022-09-20 DIAGNOSIS — G35 Multiple sclerosis: Secondary | ICD-10-CM

## 2022-09-20 DIAGNOSIS — R29818 Other symptoms and signs involving the nervous system: Secondary | ICD-10-CM

## 2022-09-20 DIAGNOSIS — R278 Other lack of coordination: Secondary | ICD-10-CM | POA: Insufficient documentation

## 2022-09-20 DIAGNOSIS — R2681 Unsteadiness on feet: Secondary | ICD-10-CM | POA: Insufficient documentation

## 2022-09-20 NOTE — Therapy (Unsigned)
OUTPATIENT OCCUPATIONAL THERAPY NEURO EVALUATION  Patient Name: Katrina Rivera MRN: [id number] DOB:11-02-1945, 77 y.o., female Today's Date: 09/20/2022  PCP: Noberto Retort, MD  REFERRING PROVIDER: Charlton Amor, PA-C  END OF SESSION:  OT End of Session - 09/20/22 1020     Visit Number 1    Number of Visits 13    Date for OT Re-Evaluation 11/18/22    Authorization Type [employer] - follows Medicare guidelines    Progress Note Due on Visit 10    OT Start Time 1021    OT Stop Time 1059    OT Time Calculation (min) 38 min    Activity Tolerance Patient tolerated treatment well    Behavior During Therapy WFL for tasks assessed/performed             Past Medical History:  Diagnosis Date   Addison's disease (HCC)    takes Solu Cortef daily   Anemia    takes Ferrous Sulfate daily   Anxiety    takes Xanax nightly   Arthritis    Chronic back pain    stenosis   Depression    takes Cymbalta daily   Dizziness    if b/p drops    Fibromyalgia    History of blood transfusion    no abnormal reaction noted   History of bronchitis    many yrs ago    Hypokalemia    takes Potassium daily   Hypotension    takes Florinef daily   IBS (irritable bowel syndrome)    takes Align daily   Insomnia    takes Trazodone nightly   Joint pain    Multiple sclerosis (HCC)    doesn't take any meds   Multiple sclerosis (HCC)    New onset headache 12/22/2021   Nocturia    Osteoporosis    Palpitations    Peripheral neuropathy    Primary localized osteoarthritis of left knee 08/11/2020   Restless leg syndrome    Seasonal allergies    takes Zyrtec daily;uses Flonase daily as needed   Syncope    when missed methylprednisolone dose   Urinary frequency    takes Flomax daily   Weakness    numbness and tingling   Past Surgical History:  Procedure Laterality Date   ABDOMINAL HYSTERECTOMY  02/07/1986   APPENDECTOMY  02/07/1986   CESAREAN SECTION  1973/1977   x2    CHOLECYSTECTOMY  02/08/1995   COLECTOMY  02/08/1988   COLONOSCOPY     ESOPHAGOGASTRODUODENOSCOPY     EYE SURGERY     bilateral - /w IOL- cataracts   FRACTURE SURGERY Right    rods and screws-right leg   LUMBAR LAMINECTOMY/DECOMPRESSION MICRODISCECTOMY Left 01/24/2013   Procedure: LUMBAR FIVE TO SACRAL ONE LUMBAR LAMINECTOMY/DECOMPRESSION MICRODISCECTOMY 1 LEVEL;  Surgeon: Tia Alert, MD;  Location: [facility];  Service: Neurosurgery;  Laterality: Left;   MAXIMUM ACCESS (MAS)POSTERIOR LUMBAR INTERBODY FUSION (PLIF) 1 LEVEL N/A 06/18/2014   Procedure: MAXIMUM ACCESS SURGERY POSTERIOR LUMBAR INTERBODY FUSION LUMBAR FIVE TO SACRAL ONE ;  Surgeon: Tia Alert, MD;  Location: [facility];  Service: Neurosurgery;  Laterality: N/A;   SPINAL CORD STIMULATOR BATTERY EXCHANGE Right 07/30/2021   Procedure: Spinal cord stimulator battery replacement;  Surgeon: Tia Alert, MD;  Location: [facility] OR;  Service: Neurosurgery;  Laterality: Right;   SPINAL CORD STIMULATOR IMPLANT     TOTAL KNEE ARTHROPLASTY Left 08/24/2020   Procedure: TOTAL KNEE ARTHROPLASTY;  Surgeon: Salvatore Marvel, MD;  Location: [facility];  Service: Orthopedics;  Laterality: Left;   Patient Active Problem List   Diagnosis Date Noted   Lumbar burst fracture (HCC) 08/23/2022   Lumbar vertebral fracture (HCC) 08/19/2022   Chronic migraine w/o aura w/o status migrainosus, not intractable 03/29/2022   Mixed hyperlipidemia 12/27/2021   Chest pain of uncertain etiology 12/27/2021   New onset headache 12/22/2021   S/P lumbar fusion 07/30/2021   Primary localized osteoarthritis of left knee 08/11/2020   Atrophic vaginitis 08/11/2020   Menopausal symptom 08/11/2020   Restless leg syndrome    Chronic back pain    Low back pain without sciatica 11/01/2018   Essential hypertension    SBO (small bowel obstruction) (HCC) 09/01/2016   Fibromyalgia    Abdominal pain, vomiting, and diarrhea    Dehydration    Somnolence, daytime 10/22/2015    Fatigue 10/22/2015   Chronic leg pain 06/29/2015   S/P lumbar spinal fusion 06/18/2014   Abnormality of gait 02/18/2013   S/P lumbar microdiscectomy 01/24/2013   Hypokalemia 12/24/2012   Sweating abnormality 10/19/2012   Dysautonomia orthostatic hypotension syndrome 08/16/2012   Hereditary and idiopathic peripheral neuropathy 08/16/2012   Chronic adrenal insufficiency (HCC) 08/16/2012   MS (multiple sclerosis) (HCC) 08/15/2012    ONSET DATE: 08/24/2022 (date of referral)  REFERRING DIAG: G35 (ICD-10-CM) - Multiple sclerosis  THERAPY DIAG:  Muscle weakness (generalized)  Other lack of coordination  Other symptoms and signs involving the nervous system  MS (multiple sclerosis) (HCC)  Rationale for Evaluation and Treatment: Rehabilitation  SUBJECTIVE:   SUBJECTIVE STATEMENT: Has vacation the middle of September. Drops things from right hand. Has recently had trigger finger in R middle finger as well as OA and hx of CTS.   Pt accompanied by: self and husband stays in lobby  PERTINENT HISTORY: MS, addison's disease, anemia, anxiety, arthritis, chronic back pain, depression, fibromyalgia, hypotension, IBS, peripheral neuropathy, syncope, spinal cord stimulator, and prior back surgery. Pt underwent fusion of L2-4 and removal of segmental fixation of L3-5 to manage L3 spinal fx sustained during a fall (7/10 date of surgery).   PRECAUTIONS: Fall and Other: Pt recently had back surgery, no precautions reported or listed; Brace for back to be donned when up and moving; no lifting over 10 lbs  - contacted ortho for clarification  WEIGHT BEARING RESTRICTIONS: No  PAIN:  Are you having pain? Yes: NPRS scale: 4/10 Pain location: back Pain description: dull; achey Aggravating factors: "overdoing it" Relieving factors: rest; medication  FALLS: Has patient fallen in last 6 months? Yes. Number of falls 4 including fall with lumbar fracture while reaching for pencil on the floor.    LIVING ENVIRONMENT: Lives with: lives with their spouse Lives in: House/apartment Stairs: Yes: External: 1 steps; none Has following equipment at home: Single point cane, Walker - 2 wheeled, Environmental consultant - 4 wheeled, Wheelchair (manual), shower chair, bed side commode, and toilet riser  PLOF: Independent; has not driven recently; retired from [employer]  PATIENT GOALS: Improve B hand function, especially R hand  OBJECTIVE:   HAND DOMINANCE: Right  ADLs: Overall ADLs: mostly mod I Eating: husband cuts food for her, which is baseline LB Dressing: min A for donning shoes Bathing: husband cleans back  Tub Shower transfers: min A  IADLs: Shopping: mod A Light housekeeping: doesn't vacuum at baseline; mod A  Community mobility: SBA Handwriting: 50% legible  MOBILITY STATUS: Needs Assist: SBA with RW  ACTIVITY TOLERANCE: Activity tolerance: fair  FUNCTIONAL OUTCOME MEASURES: PSFS:  Total  score = sum of the activity scores/number of activities Minimum detectable change (90%CI) for average score = 2 points Minimum detectable change (90%CI) for single activity score = 3 points  UPPER EXTREMITY ROM:    BUE WNL; however opposition B impaired  UPPER EXTREMITY MMT:     BUE elbow ext and flex WFL  HAND FUNCTION: Grip strength: Right: 34.6 lbs; Left: 22.4 lbs  COORDINATION: 9 Hole Peg test: Right: 40 sec; Left: 38 sec  SENSATION: Light touch: Impaired  and hx of neuropathy; mainly in R index finger  EDEMA: none reported or observed  MUSCLE TONE: WNL  COGNITION: Overall cognitive status: Within functional limits for tasks assessed  VISION: Subjective report: changes in acuities Baseline vision: Wears glasses for reading only and will be getting eye exam Visual history: glaucoma and cataracts  VISION ASSESSMENT: WFL - to read clock on wall  PERCEPTION: Impaired: proprioception  PRAXIS: WFL  OBSERVATIONS: Pt appears well kept. She has RW with walker bag.  Requires SBA for ambulation.    TODAY'S TREATMENT:                                                                                                                               N/A this date  PATIENT EDUCATION: Education details: OT role and POC Person educated: Patient Education method: Explanation Education comprehension: verbalized understanding  HOME EXERCISE PROGRAM: N/A this date  GOALS:  SHORT TERM GOALS: Target date: 10/18/2022    Patient will demonstrate independence with initial HEP. Baseline: Goal status: INITIAL  2.  Patient will independently verbalize at least 3 energy conservation principles in relation to ADLs to increase functional independence.  Baseline:  Goal status: INITIAL   LONG TERM GOALS: Target date: 11/01/2022    Patient will report at least two-point increase in average PSFS score or at least three-point increase in a single activity score indicating functionally significant improvement given minimum detectable change.  Baseline: 1.3 total score (See above for individual activity scores)  Goal status: INITIAL  2.  Patient will demonstrate at least 30 lbs L grip strength as needed to open jars and other containers.  Baseline: 22.4 lbs Goal status: INITIAL  3.  Patient will demonstrate improvement with nine-hole peg by at least 5 seconds bilaterally. Baseline: Right: 40 sec; Left: 38 sec Goal status: INITIAL  4.  Pt will report handwriting to be at least 75% of prior legibility/neatness. Baseline: 50% legibility Goal status: INITIAL   ASSESSMENT:  CLINICAL IMPRESSION: Patient is a 77 y.o. female who was seen today for occupational therapy evaluation for MS. Hx includes MS, addison's disease, anemia, anxiety, arthritis, chronic back pain, depression, fibromyalgia, hypotension, IBS, peripheral neuropathy, syncope, spinal cord stimulator, and prior back surgery. Pt underwent fusion of L2-4 and removal of segmental fixation of L3-5 to manage  L3 spinal fx sustained during a fall (7/10 date of surgery). Patient currently presents below baseline level of functioning demonstrating functional deficits and  impairments as noted below. Pt would benefit from skilled OT services in the outpatient setting to work on impairments as noted below to help pt return to PLOF as able.    PERFORMANCE DEFICITS: in functional skills including ADLs, IADLs, coordination, sensation, strength, pain, Fine motor control, mobility, endurance, decreased knowledge of precautions, decreased knowledge of use of DME, and UE functional use.  IMPAIRMENTS: are limiting patient from ADLs and IADLs.   CO-MORBIDITIES: has co-morbidities such as recent spinal surgery  that affects occupational performance. Patient will benefit from skilled OT to address above impairments and improve overall function.  MODIFICATION OR ASSISTANCE TO COMPLETE EVALUATION: Min-Moderate modification of tasks or assist with assess necessary to complete an evaluation.  OT OCCUPATIONAL PROFILE AND HISTORY: Detailed assessment: Review of records and additional review of physical, cognitive, psychosocial history related to current functional performance.  CLINICAL DECISION MAKING: LOW - limited treatment options, no task modification necessary  REHAB POTENTIAL: Fair given chronicity of impairments  EVALUATION COMPLEXITY: Low    PLAN:  OT FREQUENCY: 2x/week  OT DURATION: 6 weeks  PLANNED INTERVENTIONS: self care/ADL training, therapeutic exercise, therapeutic activity, neuromuscular re-education, manual therapy, functional mobility training, electrical stimulation, fluidotherapy, moist heat, patient/family education, energy conservation, DME and/or AE instructions, and Re-evaluation  RECOMMENDED OTHER SERVICES: none at this time  CONSULTED AND AGREED WITH PLAN OF CARE: Patient  PLAN FOR NEXT SESSION: coordination and strengthening HEPs   Delana Meyer, OT 09/20/2022, 5:03  PM

## 2022-09-20 NOTE — Therapy (Unsigned)
OUTPATIENT PHYSICAL THERAPY NEURO EVALUATION   Patient Name: Katrina Rivera MRN: [id number] DOB:[birth year], 77 y.o., female Today's Date: 09/21/2022   PCP: Noberto Retort, MD REFERRING PROVIDER: Charlton Amor, PA-C  END OF SESSION:  PT End of Session - 09/21/22 1743     Visit Number 1    Number of Visits 17    Date for PT Re-Evaluation 11/18/22    Authorization Type [employer]    Authorization Time Period 09-20-22 - 12-08-22    Progress Note Due on Visit 10    PT Start Time 1104    PT Stop Time 1149    PT Time Calculation (min) 45 min    Equipment Utilized During Treatment Gait belt;Other (comment)   pt wearing her LSO   Activity Tolerance Patient tolerated treatment well    Behavior During Therapy WFL for tasks assessed/performed             Past Medical History:  Diagnosis Date   Addison's disease (HCC)    takes Solu Cortef daily   Anemia    takes Ferrous Sulfate daily   Anxiety    takes Xanax nightly   Arthritis    Chronic back pain    stenosis   Depression    takes Cymbalta daily   Dizziness    if b/p drops    Fibromyalgia    History of blood transfusion    no abnormal reaction noted   History of bronchitis    many yrs ago    Hypokalemia    takes Potassium daily   Hypotension    takes Florinef daily   IBS (irritable bowel syndrome)    takes Align daily   Insomnia    takes Trazodone nightly   Joint pain    Multiple sclerosis (HCC)    doesn't take any meds   Multiple sclerosis (HCC)    New onset headache 12/22/2021   Nocturia    Osteoporosis    Palpitations    Peripheral neuropathy    Primary localized osteoarthritis of left knee 08/11/2020   Restless leg syndrome    Seasonal allergies    takes Zyrtec daily;uses Flonase daily as needed   Syncope    when missed methylprednisolone dose   Urinary frequency    takes Flomax daily   Weakness    numbness and tingling   Past Surgical History:  Procedure Laterality Date    ABDOMINAL HYSTERECTOMY  02/07/1986   APPENDECTOMY  02/07/1986   CESAREAN SECTION  1973/1977   x2   CHOLECYSTECTOMY  02/08/1995   COLECTOMY  02/08/1988   COLONOSCOPY     ESOPHAGOGASTRODUODENOSCOPY     EYE SURGERY     bilateral - /w IOL- cataracts   FRACTURE SURGERY Right    rods and screws-right leg   LUMBAR LAMINECTOMY/DECOMPRESSION MICRODISCECTOMY Left 01/24/2013   Procedure: LUMBAR FIVE TO SACRAL ONE LUMBAR LAMINECTOMY/DECOMPRESSION MICRODISCECTOMY 1 LEVEL;  Surgeon: Tia Alert, MD;  Location: [facility];  Service: Neurosurgery;  Laterality: Left;   MAXIMUM ACCESS (MAS)POSTERIOR LUMBAR INTERBODY FUSION (PLIF) 1 LEVEL N/A 06/18/2014   Procedure: MAXIMUM ACCESS SURGERY POSTERIOR LUMBAR INTERBODY FUSION LUMBAR FIVE TO SACRAL ONE ;  Surgeon: Tia Alert, MD;  Location: [facility];  Service: Neurosurgery;  Laterality: N/A;   SPINAL CORD STIMULATOR BATTERY EXCHANGE Right 07/30/2021   Procedure: Spinal cord stimulator battery replacement;  Surgeon: Tia Alert, MD;  Location: [facility] OR;  Service: Neurosurgery;  Laterality: Right;   SPINAL CORD STIMULATOR IMPLANT  TOTAL KNEE ARTHROPLASTY Left 08/24/2020   Procedure: TOTAL KNEE ARTHROPLASTY;  Surgeon: Salvatore Marvel, MD;  Location: [facility];  Service: Orthopedics;  Laterality: Left;   Patient Active Problem List   Diagnosis Date Noted   Lumbar burst fracture (HCC) 08/23/2022   Lumbar vertebral fracture (HCC) 08/19/2022   Chronic migraine w/o aura w/o status migrainosus, not intractable 03/29/2022   Mixed hyperlipidemia 12/27/2021   Chest pain of uncertain etiology 12/27/2021   New onset headache 12/22/2021   S/P lumbar fusion 07/30/2021   Primary localized osteoarthritis of left knee 08/11/2020   Atrophic vaginitis 08/11/2020   Menopausal symptom 08/11/2020   Restless leg syndrome    Chronic back pain    Low back pain without sciatica 11/01/2018   Essential hypertension    SBO (small bowel obstruction) (HCC) 09/01/2016    Fibromyalgia    Abdominal pain, vomiting, and diarrhea    Dehydration    Somnolence, daytime 10/22/2015   Fatigue 10/22/2015   Chronic leg pain 06/29/2015   S/P lumbar spinal fusion 06/18/2014   Abnormality of gait 02/18/2013   S/P lumbar microdiscectomy 01/24/2013   Hypokalemia 12/24/2012   Sweating abnormality 10/19/2012   Dysautonomia orthostatic hypotension syndrome 08/16/2012   Hereditary and idiopathic peripheral neuropathy 08/16/2012   Chronic adrenal insufficiency (HCC) 08/16/2012   MS (multiple sclerosis) (HCC) 08/15/2012    ONSET DATE: 08-17-22  REFERRING DIAG: G35 (ICD-10-CM) - Multiple sclerosis   Diagnosis  S32.039D (ICD-10-CM) - Closed fracture of third lumbar vertebra with routine healing, unspecified fracture morphology, subsequent encounter    THERAPY DIAG:  Other abnormalities of gait and mobility - Plan: PT plan of care cert/re-cert  Muscle weakness (generalized) - Plan: PT plan of care cert/re-cert  Unsteadiness on feet - Plan: PT plan of care cert/re-cert  Rationale for Evaluation and Treatment: Rehabilitation  SUBJECTIVE:                                                                                                                                                                                             SUBJECTIVE STATEMENT: Pt presents with RW wearing LSO due to lumbar decompression surgery on 08-17-22 (L3 burst fracture); pt was admitted to inpatient rehab on 08-19-22 - 08-29-22 and discharged home.  Pt underwent lumbar fusion May 2024 - reports she had 4 falls and fractured L3; had L3-L4 decompression on 08-17-22 Pt accompanied by: self  PERTINENT HISTORY: spinal cord stimulator implant; MS diagnosed in 1984; s/p 2 back surgeries Per Chart note "Brief HPI:   Katrina Rivera is a 77 y.o. female with a history of relapsing remitting multiple sclerosis who was evaluated by  Dr. Yetta Barre regarding gait abnormality, chronic low back pain and status post lumbar  fusion May 2024. She has reportedly fallen 4 times at home after PLIF of L3-5. She was admitted 08/17/2022 for scheduled surgery of L3 spinal fracture. She underwent posterior fixation L2-L4 using NuVasive cortical pedicle screws, arthrodesis and removal of segmental fixation L3-L5 by Dr. Yetta Barre. PT OT evaluations carried out. Incision is healed without signs of infection. She is appropriate back soreness. She has no complaints of leg pain or new numbness, tingling or weakness per Dr. Barnett Applebaum note today. Signs are stable and she is tolerating regular diet. Other past medical history includes restless leg syndrome, anxiety, headaches with migraine features, fibromyalgia, hypotension, anemia (acute on chronic)."    PAIN:  Are you having pain? Yes: NPRS scale: 5/10 Pain location: back pain Pain description: tightness, throbbing, sore Aggravating factors: no specific Relieving factors: Brace helps Pt also reports pain in legs  PRECAUTIONS: Back  - No bending, lifting >10#, twisting, reaching overhead; Dr Yetta Barre responded with "regular back precautions" via inbasket message inquiring about precautions  RED FLAGS: None   WEIGHT BEARING RESTRICTIONS: No  FALLS: Has patient fallen in last 6 months? Yes. Number of falls 4  LIVING ENVIRONMENT: Lives with: lives with their spouse Lives in: House/apartment Stairs: Yes: External: 1 steps; planning on having rail installed Has following equipment at home: Walker - 2 wheeled, shower chair, and Grab bars  PLOF: Independent with household mobility with device, Requires assistive device for independence, and Needs assistance with ADLs  PATIENT GOALS: "walk without walker and back brace and get back to where I was before"  OBJECTIVE:   DIAGNOSTIC FINDINGS: IMPRESSION: 1. Unhealed fracture of the posterosuperior L3 body, ans associated avulsed appearance of both L3 pedicles (series 7, image 30) along the course of the bilateral L3 pedicle screws. Mild  retropulsion of the posterior body fragment does not result in significant spinal stenosis. 2. No other acute osseous abnormality identified in the Lumbar Spine. But scant arthrodesis limited to only the right L5-S1 posterior elements following L3 through S1 fusion. Some endplate subsidence at each level. 3.  Aortic Atherosclerosis (ICD10-I70.0).  Nephrolithiasis.  COGNITION: Overall cognitive status: Within functional limits for tasks assessed   SENSATION: Impaired sensation in arms and legs - numbness and tingling  COORDINATION: WFL's - slowed movement due to back pain  POSTURE: rounded shoulders and forward head  LOWER EXTREMITY ROM:   WFL's bil. LE's   LOWER EXTREMITY MMT:  grossly 4/5 throughout but pain with resistance due to back pain (surgical site)  BED MOBILITY:  Independent   TRANSFERS: Assistive device utilized: Environmental consultant - 4 wheeled  Sit to stand: Modified independence Stand to sit: Modified independence  STAIRS:  TBA  GAIT: Gait pattern:  increased knee flexion bil. LE's in stance  and step through pattern Distance walked: 26' Assistive device utilized: Environmental consultant - 2 wheeled Level of assistance: SBA Comments: pt amb. With bil. Knees flexed  FUNCTIONAL TESTS:  5 times sit to stand: 21.47 secs from chair without UE support with close CGA Timed up and go (TUG): 21.47 secs with RW 10 meter walk test: 21.56 = 1.52 ft/sec with RW Berg Balance Scale: to be assessed  - pt stood for 2" without UE support with SBA  PATIENT SURVEYS:  N/A - dx is MS  TODAY'S TREATMENT:  DATE:   EVAL only - 09-20-22    PATIENT EDUCATION: Education details: eval results - POC with [facility] 8 weeks; discussed aquatic PT when appropriate but pt doesn't think she is interested in this program at this time Person educated: Patient Education method:  Explanation Education comprehension: verbalized understanding  HOME EXERCISE PROGRAM: To be established  GOALS: Goals reviewed with patient? Yes  SHORT TERM GOALS: Target date: 10-21-22  Improve Berg score by at least 4 points to reduce fall risk. Baseline: Goal status: INITIAL  2.  Pt will ambulate 230' without device with CGA on flat, even surface. Baseline:  Goal status: INITIAL  3.  Report decreased back pain to </= 3/10 intensity for increased ease & comfort with ADL's & mobility.  Baseline: 5/10 Goal status: INITIAL  4.  Improve 5x sit to stand score to </= 17 secs from chair without UE support to demo improved LE strength.  Baseline: 21.47 secs Goal status: INITIAL  5.  Improve TUG score to </= 17.5 secs with RW to demo improved functional mobility.  Baseline: 21.47 secs with RW Goal status: INITIAL  6.  Increase gait speed to >/= 1.9 ft/sec with RW with pt demonstrating bil. Knee extension in stance.  Baseline: 1.52 ft/sec with RW (21.56 secs) Goal status: INITIAL   LONG TERM GOALS: Target date: 11-18-22  Improve Berg score by at least 8 points to reduce fall risk & demo improved balance. Baseline:  Goal status: INITIAL  2.  Pt will be modified independent with household ambulation without device. Baseline:  Goal status: INITIAL  3.  Improve 5x sit to stand score to </= 14 secs from chair without UE support to demo improved LE strength.  Baseline: 21.47 secs Goal status: INITIAL  4.  Improve TUG score to </= 14.5 secs with RW to demo improved functional mobility.  Baseline: 21.47 secs with RW Goal status: INITIAL  5.   Increase gait speed to >/= 2.3 ft/sec with RW with pt demonstrating bil. Knee extension in stance.  Baseline: 1.52 ft/sec with RW (21.56 secs) Goal status: INITIAL  6.  Independent in HEP for balance and LE strengthening and core stabilization exercises. Baseline:  Goal status: INITIAL  ASSESSMENT:  CLINICAL IMPRESSION: Patient is  a 77 y.o. lady who was seen today for physical therapy evaluation and treatment for s/p L2-4 fixation and removal of previous hardware and MS.  Pt is currently amb. With RW and is wearing LSO due to s/p back surgery.  Pt presents with balance and gait deficits with pt demonstrating bil. Knee flexion in stance phase of gait.  Strength of bil. LE's is grossly WFL's with pt reporting some pain with resistance with MMT.  Pt is at fall risk per TUG score 21.47 secs; Berg balance test was not completed due to time constraints.  Pt will benefit from PT to address balance and gait deficits and LE weakness.    OBJECTIVE IMPAIRMENTS: Abnormal gait, decreased activity tolerance, decreased balance, decreased strength, and pain.   ACTIVITY LIMITATIONS: carrying, lifting, bending, squatting, stairs, transfers, reach over head, and locomotion level  PARTICIPATION LIMITATIONS: meal prep, cleaning, laundry, shopping, community activity, and pt reports she does not drive  PERSONAL FACTORS: Past/current experiences, Time since onset of injury/illness/exacerbation, and 1 comorbidity: MS and s/p back surgery  are also affecting patient's functional outcome.   REHAB POTENTIAL: Good  CLINICAL DECISION MAKING: Evolving/moderate complexity  EVALUATION COMPLEXITY: Moderate  PLAN:  PT FREQUENCY: 2x/week  PT DURATION: 8 weeks  PLANNED INTERVENTIONS: Therapeutic exercises, Therapeutic activity, Neuromuscular re-education, Balance training, Gait training, Patient/Family education, Self Care, Stair training, DME instructions, and Aquatic Therapy  PLAN FOR NEXT SESSION: please complete Berg balance test; begin gait training - try to increase knee extension in stance as pt amb. With both knees flexed; issue HEP if time permits   Ankita, Fabiano, PT 09/21/2022, 6:37 PM

## 2022-09-21 ENCOUNTER — Encounter: Payer: Self-pay | Admitting: Physical Therapy

## 2022-09-26 ENCOUNTER — Ambulatory Visit: Payer: PPO | Admitting: Physical Therapy

## 2022-09-26 VITALS — BP 103/55 | HR 85

## 2022-09-26 DIAGNOSIS — G35 Multiple sclerosis: Secondary | ICD-10-CM

## 2022-09-26 DIAGNOSIS — R262 Difficulty in walking, not elsewhere classified: Secondary | ICD-10-CM

## 2022-09-26 DIAGNOSIS — R2681 Unsteadiness on feet: Secondary | ICD-10-CM

## 2022-09-26 DIAGNOSIS — R2689 Other abnormalities of gait and mobility: Secondary | ICD-10-CM | POA: Diagnosis not present

## 2022-09-26 DIAGNOSIS — R269 Unspecified abnormalities of gait and mobility: Secondary | ICD-10-CM

## 2022-09-26 DIAGNOSIS — M6281 Muscle weakness (generalized): Secondary | ICD-10-CM

## 2022-09-26 NOTE — Therapy (Signed)
OUTPATIENT PHYSICAL THERAPY NEURO TREATMENT   Patient Name: Katrina Rivera MRN: [id number] DOB:[birth year], 77 y.o., female Today's Date: 09/26/2022   PCP: Noberto Retort, MD REFERRING PROVIDER: Charlton Amor, PA-C  END OF SESSION:  PT End of Session - 09/26/22 1107     Visit Number 2    Number of Visits 17    Date for PT Re-Evaluation 11/18/22    Authorization Type [employer]    Authorization Time Period 09-20-22 - 12-08-22    Progress Note Due on Visit 10    PT Start Time 1100    PT Stop Time 1145    PT Time Calculation (min) 45 min    Equipment Utilized During Treatment Gait belt;Other (comment)   pt wearing her LSO   Activity Tolerance Patient tolerated treatment well    Behavior During Therapy WFL for tasks assessed/performed              Past Medical History:  Diagnosis Date   Addison's disease (HCC)    takes Solu Cortef daily   Anemia    takes Ferrous Sulfate daily   Anxiety    takes Xanax nightly   Arthritis    Chronic back pain    stenosis   Depression    takes Cymbalta daily   Dizziness    if b/p drops    Fibromyalgia    History of blood transfusion    no abnormal reaction noted   History of bronchitis    many yrs ago    Hypokalemia    takes Potassium daily   Hypotension    takes Florinef daily   IBS (irritable bowel syndrome)    takes Align daily   Insomnia    takes Trazodone nightly   Joint pain    Multiple sclerosis (HCC)    doesn't take any meds   Multiple sclerosis (HCC)    New onset headache 12/22/2021   Nocturia    Osteoporosis    Palpitations    Peripheral neuropathy    Primary localized osteoarthritis of left knee 08/11/2020   Restless leg syndrome    Seasonal allergies    takes Zyrtec daily;uses Flonase daily as needed   Syncope    when missed methylprednisolone dose   Urinary frequency    takes Flomax daily   Weakness    numbness and tingling   Past Surgical History:  Procedure Laterality Date    ABDOMINAL HYSTERECTOMY  02/07/1986   APPENDECTOMY  02/07/1986   CESAREAN SECTION  1973/1977   x2   CHOLECYSTECTOMY  02/08/1995   COLECTOMY  02/08/1988   COLONOSCOPY     ESOPHAGOGASTRODUODENOSCOPY     EYE SURGERY     bilateral - /w IOL- cataracts   FRACTURE SURGERY Right    rods and screws-right leg   LUMBAR LAMINECTOMY/DECOMPRESSION MICRODISCECTOMY Left 01/24/2013   Procedure: LUMBAR FIVE TO SACRAL ONE LUMBAR LAMINECTOMY/DECOMPRESSION MICRODISCECTOMY 1 LEVEL;  Surgeon: Tia Alert, MD;  Location: [facility];  Service: Neurosurgery;  Laterality: Left;   MAXIMUM ACCESS (MAS)POSTERIOR LUMBAR INTERBODY FUSION (PLIF) 1 LEVEL N/A 06/18/2014   Procedure: MAXIMUM ACCESS SURGERY POSTERIOR LUMBAR INTERBODY FUSION LUMBAR FIVE TO SACRAL ONE ;  Surgeon: Tia Alert, MD;  Location: [facility];  Service: Neurosurgery;  Laterality: N/A;   SPINAL CORD STIMULATOR BATTERY EXCHANGE Right 07/30/2021   Procedure: Spinal cord stimulator battery replacement;  Surgeon: Tia Alert, MD;  Location: [facility];  Service: Neurosurgery;  Laterality: Right;   SPINAL CORD STIMULATOR  IMPLANT     TOTAL KNEE ARTHROPLASTY Left 08/24/2020   Procedure: TOTAL KNEE ARTHROPLASTY;  Surgeon: Salvatore Marvel, MD;  Location: [facility];  Service: Orthopedics;  Laterality: Left;   Patient Active Problem List   Diagnosis Date Noted   Lumbar burst fracture (HCC) 08/23/2022   Lumbar vertebral fracture (HCC) 08/19/2022   Chronic migraine w/o aura w/o status migrainosus, not intractable 03/29/2022   Mixed hyperlipidemia 12/27/2021   Chest pain of uncertain etiology 12/27/2021   New onset headache 12/22/2021   S/P lumbar fusion 07/30/2021   Primary localized osteoarthritis of left knee 08/11/2020   Atrophic vaginitis 08/11/2020   Menopausal symptom 08/11/2020   Restless leg syndrome    Chronic back pain    Low back pain without sciatica 11/01/2018   Essential hypertension    SBO (small bowel obstruction) (HCC) 09/01/2016    Fibromyalgia    Abdominal pain, vomiting, and diarrhea    Dehydration    Somnolence, daytime 10/22/2015   Fatigue 10/22/2015   Chronic leg pain 06/29/2015   S/P lumbar spinal fusion 06/18/2014   Abnormality of gait 02/18/2013   S/P lumbar microdiscectomy 01/24/2013   Hypokalemia 12/24/2012   Sweating abnormality 10/19/2012   Dysautonomia orthostatic hypotension syndrome 08/16/2012   Hereditary and idiopathic peripheral neuropathy 08/16/2012   Chronic adrenal insufficiency (HCC) 08/16/2012   MS (multiple sclerosis) (HCC) 08/15/2012    ONSET DATE: 08-17-22  REFERRING DIAG: G35 (ICD-10-CM) - Multiple sclerosis   Diagnosis  S32.039D (ICD-10-CM) - Closed fracture of third lumbar vertebra with routine healing, unspecified fracture morphology, subsequent encounter    THERAPY DIAG:  Muscle weakness (generalized)  MS (multiple sclerosis) (HCC)  Other abnormalities of gait and mobility  Unsteadiness on feet  Abnormality of gait  Difficulty in walking, not elsewhere classified  Rationale for Evaluation and Treatment: Rehabilitation  SUBJECTIVE:                                                                                                                                                                                             SUBJECTIVE STATEMENT: Pt reports ongoing back pain at surgical site. Pt reports no falls since last visit. Pt does have onset of some dizziness while entering clinic, see BP readings below. Pt reports history of OH but has not had any issues with dizziness recently.  Pt sees Dr. Yetta Barre on Thursday (neurosurgeon) and Dr. Berline Chough next week (8/28).  Pt accompanied by: self  PERTINENT HISTORY: spinal cord stimulator implant; MS diagnosed in 1984; s/p 2 back surgeries Per Chart note "Brief HPI:   NEHIR QIU is a 77 y.o. female with a history of  relapsing remitting multiple sclerosis who was evaluated by Dr. Yetta Barre regarding gait abnormality, chronic low  back pain and status post lumbar fusion May 2024. She has reportedly fallen 4 times at home after PLIF of L3-5. She was admitted 08/17/2022 for scheduled surgery of L3 spinal fracture. She underwent posterior fixation L2-L4 using NuVasive cortical pedicle screws, arthrodesis and removal of segmental fixation L3-L5 by Dr. Yetta Barre. PT OT evaluations carried out. Incision is healed without signs of infection. She is appropriate back soreness. She has no complaints of leg pain or new numbness, tingling or weakness per Dr. Barnett Applebaum note today. Signs are stable and she is tolerating regular diet. Other past medical history includes restless leg syndrome, anxiety, headaches with migraine features, fibromyalgia, hypotension, anemia (acute on chronic)."    PAIN:  Are you having pain? Yes: NPRS scale: 5/10 Pain location: back pain Pain description: tightness, throbbing, sore Aggravating factors: no specific Relieving factors: Brace helps Pt also reports pain in legs  PRECAUTIONS: Back  - No bending, lifting >10#, twisting, reaching overhead; Dr Yetta Barre responded with "regular back precautions" via inbasket message inquiring about precautions  RED FLAGS: None   WEIGHT BEARING RESTRICTIONS: No  FALLS: Has patient fallen in last 6 months? Yes. Number of falls 4  LIVING ENVIRONMENT: Lives with: lives with their spouse Lives in: House/apartment Stairs: Yes: External: 1 steps; planning on having rail installed Has following equipment at home: Walker - 2 wheeled, shower chair, and Grab bars  PLOF: Independent with household mobility with device, Requires assistive device for independence, and Needs assistance with ADLs  PATIENT GOALS: "walk without walker and back brace and get back to where I was before"  OBJECTIVE:   DIAGNOSTIC FINDINGS: IMPRESSION: 1. Unhealed fracture of the posterosuperior L3 body, ans associated avulsed appearance of both L3 pedicles (series 7, image 30) along the course of the  bilateral L3 pedicle screws. Mild retropulsion of the posterior body fragment does not result in significant spinal stenosis. 2. No other acute osseous abnormality identified in the Lumbar Spine. But scant arthrodesis limited to only the right L5-S1 posterior elements following L3 through S1 fusion. Some endplate subsidence at each level. 3.  Aortic Atherosclerosis (ICD10-I70.0).  Nephrolithiasis.  COGNITION: Overall cognitive status: Within functional limits for tasks assessed   SENSATION: Impaired sensation in arms and legs - numbness and tingling  COORDINATION: WFL's - slowed movement due to back pain  POSTURE: rounded shoulders and forward head  LOWER EXTREMITY ROM:   WFL's bil. LE's   LOWER EXTREMITY MMT:  grossly 4/5 throughout but pain with resistance due to back pain (surgical site)  BED MOBILITY:  Independent   TRANSFERS: Assistive device utilized: Environmental consultant - 4 wheeled  Sit to stand: Modified independence Stand to sit: Modified independence  STAIRS:  TBA  GAIT: Gait pattern:  increased knee flexion bil. LE's in stance  and step through pattern Distance walked: 63' Assistive device utilized: Environmental consultant - 2 wheeled Level of assistance: SBA Comments: pt amb. With bil. Knees flexed  FUNCTIONAL TESTS:  5 times sit to stand: 21.47 secs from chair without UE support with close CGA Timed up and go (TUG): 21.47 secs with RW 10 meter walk test: 21.56 = 1.52 ft/sec with RW Berg Balance Scale: to be assessed  - pt stood for 2" without UE support with SBA  PATIENT SURVEYS:  N/A - dx is MS  TODAY'S TREATMENT:  VITALS Vitals:   09/26/22 1119 09/26/22 1120  BP: (!) 119/59 (!) 103/55  Pulse: 85 85  BP assessed in sitting after episode of dizziness, then assessed after standing x 2 min but pt remains asymptomatic despite drop in BP.    [facility] PT  Assessment - 09/26/22 1125       Standardized Balance Assessment   Standardized Balance Assessment Berg Balance Test      Berg Balance Test   Sit to Stand Able to stand without using hands and stabilize independently    Standing Unsupported Able to stand 2 minutes with supervision    Sitting with Back Unsupported but Feet Supported on Floor or Stool Able to sit safely and securely 2 minutes    Stand to Sit Sits safely with minimal use of hands    Transfers Able to transfer safely, minor use of hands    Standing Unsupported with Eyes Closed Able to stand 10 seconds with supervision    Standing Unsupported with Feet Together Able to place feet together independently and stand for 1 minute with supervision    From Standing, Reach Forward with Outstretched Arm Loses balance while trying/requires external support   unable to try due to back precautions   From Standing Position, Pick up Object from Floor Unable to try/needs assist to keep balance    From Standing Position, Turn to Look Behind Over each Shoulder Turn sideways only but maintains balance    Turn 360 Degrees Needs assistance while turning    Standing Unsupported, Alternately Place Feet on Step/Stool Needs assistance to keep from falling or unable to try    Standing Unsupported, One Foot in [employer] balance while stepping or standing    Standing on One Leg Able to lift leg independently and hold 5-10 seconds   6 sec   Total Score 30    Berg comment: 30/56, high fall risk            Reviewed prior HEP from CIR and added to HEP, see bolded below   PATIENT EDUCATION: Education details: results of OM and functional implications, reviewed prior HEP and added to HEP Person educated: Patient Education method: Chief Technology Officer Education comprehension: verbalized understanding  HOME EXERCISE PROGRAM: Access Code: [email or web address] URL: [email or web address] Date: 09/26/2022 Prepared by: Peter Congo  Exercises - Ankle Dorsiflexion with Resistance  - 1 x daily - 7 x weekly - 3 sets - 10 reps - Gastroc Stretch on Wall  - 1 x daily - 7 x weekly - 3 sets - 10 reps - Supine Knee Extension Stretch on Towel Roll  - 1 x daily - 7 x weekly - Supine Quad Set  - 1 x daily - 7 x weekly - 3 sets - 10 reps - Sit to Stand with Arms Crossed  - 1 x daily - 7 x weekly - 3 sets - 10 reps  GOALS: Goals reviewed with patient? Yes  SHORT TERM GOALS: Target date: 10-21-22  Improve Berg score by at least 4 points to reduce fall risk. Baseline: 30/56 (8/19) Goal status: INITIAL  2.  Pt will ambulate 230' without device with CGA on flat, even surface. Baseline:  Goal status: INITIAL  3.  Report decreased back pain to </= 3/10 intensity for increased ease & comfort with ADL's & mobility.  Baseline: 5/10 Goal status: INITIAL  4.  Improve 5x sit to stand score to </= 17 secs from chair without UE support to demo improved LE strength.  Baseline:  21.47 secs Goal status: INITIAL  5.  Improve TUG score to </= 17.5 secs with RW to demo improved functional mobility.  Baseline: 21.47 secs with RW Goal status: INITIAL  6.  Increase gait speed to >/= 1.9 ft/sec with RW with pt demonstrating bil. Knee extension in stance.  Baseline: 1.52 ft/sec with RW (21.56 secs) Goal status: INITIAL   LONG TERM GOALS: Target date: 11-18-22  Improve Berg score by at least 8 points to reduce fall risk & demo improved balance. Baseline: 30/56 (8/19) Goal status: INITIAL  2.  Pt will be modified independent with household ambulation without device. Baseline:  Goal status: INITIAL  3.  Improve 5x sit to stand score to </= 14 secs from chair without UE support to demo improved LE strength.  Baseline: 21.47 secs Goal status: INITIAL  4.  Improve TUG score to </= 14.5 secs with RW to demo improved functional mobility.  Baseline: 21.47 secs with RW Goal status: INITIAL  5.   Increase gait speed to >/= 2.3  ft/sec with RW with pt demonstrating bil. Knee extension in stance.  Baseline: 1.52 ft/sec with RW (21.56 secs) Goal status: INITIAL  6.  Independent in HEP for balance and LE strengthening and core stabilization exercises. Baseline:  Goal status: INITIAL  ASSESSMENT:  CLINICAL IMPRESSION: Emphasis of skilled PT session on assessing orthostatic vitals due to onset of OH while entering clinic this date, assessing Berg and updating goals, and reviewing HEP issued in CIR and adding to HEP. Pt does exhibit a drop in her systolic BP with standing but has no more instances of feeling symptomatic after initial instance. Patient demonstrates increased fall risk as noted by score of 30/56 on Berg Balance Scale.  (<36= high risk for falls, close to 100%; 37-45 significant >80%; 46-51 moderate >50%; 52-55 lower >25%), reviewed score and functional implications with patient. Pt exhibits ongoing tight L HS and gastroc as a result of her history of L TKA, can benefit from continued stretching of this limb. Pt continues to benefit from skilled therapy services to work towards improving her balance and decreasing her fall risk. Continue POC.    OBJECTIVE IMPAIRMENTS: Abnormal gait, decreased activity tolerance, decreased balance, decreased strength, and pain.   ACTIVITY LIMITATIONS: carrying, lifting, bending, squatting, stairs, transfers, reach over head, and locomotion level  PARTICIPATION LIMITATIONS: meal prep, cleaning, laundry, shopping, community activity, and pt reports she does not drive  PERSONAL FACTORS: Past/current experiences, Time since onset of injury/illness/exacerbation, and 1 comorbidity: MS and s/p back surgery  are also affecting patient's functional outcome.   REHAB POTENTIAL: Good  CLINICAL DECISION MAKING: Evolving/moderate complexity  EVALUATION COMPLEXITY: Moderate  PLAN:  PT FREQUENCY: 2x/week  PT DURATION: 8 weeks  PLANNED INTERVENTIONS: Therapeutic exercises,  Therapeutic activity, Neuromuscular re-education, Balance training, Gait training, Patient/Family education, Self Care, Stair training, DME instructions, and Aquatic Therapy  PLAN FOR NEXT SESSION: watch BP for hypotension, begin gait training - try to increase knee extension in stance as pt amb. With both knees flexed; add to HEP PRN, balance based on BBS impairments (step-taps, SLS, modified tandem stance), LE strengthening, endurance    Peter Congo, PT, DPT, CSRS  09/26/2022, 11:46 AM

## 2022-09-28 DIAGNOSIS — M65331 Trigger finger, right middle finger: Secondary | ICD-10-CM | POA: Diagnosis not present

## 2022-09-29 ENCOUNTER — Ambulatory Visit: Payer: PPO | Admitting: Physical Therapy

## 2022-09-29 ENCOUNTER — Encounter: Payer: Self-pay | Admitting: Physical Therapy

## 2022-09-29 VITALS — BP 141/71 | HR 70

## 2022-09-29 DIAGNOSIS — R2681 Unsteadiness on feet: Secondary | ICD-10-CM

## 2022-09-29 DIAGNOSIS — R269 Unspecified abnormalities of gait and mobility: Secondary | ICD-10-CM

## 2022-09-29 DIAGNOSIS — R262 Difficulty in walking, not elsewhere classified: Secondary | ICD-10-CM

## 2022-09-29 DIAGNOSIS — R2689 Other abnormalities of gait and mobility: Secondary | ICD-10-CM

## 2022-09-29 DIAGNOSIS — G35 Multiple sclerosis: Secondary | ICD-10-CM

## 2022-09-29 DIAGNOSIS — M6281 Muscle weakness (generalized): Secondary | ICD-10-CM

## 2022-09-29 DIAGNOSIS — S32039A Unspecified fracture of third lumbar vertebra, initial encounter for closed fracture: Secondary | ICD-10-CM | POA: Diagnosis not present

## 2022-09-29 NOTE — Therapy (Signed)
OUTPATIENT PHYSICAL THERAPY NEURO TREATMENT   Patient Name: Katrina Rivera MRN: [id number] DOB:May 17, 1945, 77 y.o., female Today's Date: 09/29/2022   PCP: Noberto Retort, MD REFERRING PROVIDER: Charlton Amor, PA-C  END OF SESSION:  PT End of Session - 09/29/22 0849     Visit Number 3    Number of Visits 17    Date for PT Re-Evaluation 11/18/22    Authorization Type [employer]    Progress Note Due on Visit 10    PT Start Time 0845    PT Stop Time 0930    PT Time Calculation (min) 45 min    Equipment Utilized During Treatment Gait belt;Other (comment)    Activity Tolerance Patient tolerated treatment well    Behavior During Therapy WFL for tasks assessed/performed              Past Medical History:  Diagnosis Date   Addison's disease (HCC)    takes Solu Cortef daily   Anemia    takes Ferrous Sulfate daily   Anxiety    takes Xanax nightly   Arthritis    Chronic back pain    stenosis   Depression    takes Cymbalta daily   Dizziness    if b/p drops    Fibromyalgia    History of blood transfusion    no abnormal reaction noted   History of bronchitis    many yrs ago    Hypokalemia    takes Potassium daily   Hypotension    takes Florinef daily   IBS (irritable bowel syndrome)    takes Align daily   Insomnia    takes Trazodone nightly   Joint pain    Multiple sclerosis (HCC)    doesn't take any meds   Multiple sclerosis (HCC)    New onset headache 12/22/2021   Nocturia    Osteoporosis    Palpitations    Peripheral neuropathy    Primary localized osteoarthritis of left knee 08/11/2020   Restless leg syndrome    Seasonal allergies    takes Zyrtec daily;uses Flonase daily as needed   Syncope    when missed methylprednisolone dose   Urinary frequency    takes Flomax daily   Weakness    numbness and tingling   Past Surgical History:  Procedure Laterality Date   ABDOMINAL HYSTERECTOMY  02/07/1986   APPENDECTOMY  02/07/1986   CESAREAN  SECTION  1973/1977   x2   CHOLECYSTECTOMY  02/08/1995   COLECTOMY  02/08/1988   COLONOSCOPY     ESOPHAGOGASTRODUODENOSCOPY     EYE SURGERY     bilateral - /w IOL- cataracts   FRACTURE SURGERY Right    rods and screws-right leg   LUMBAR LAMINECTOMY/DECOMPRESSION MICRODISCECTOMY Left 01/24/2013   Procedure: LUMBAR FIVE TO SACRAL ONE LUMBAR LAMINECTOMY/DECOMPRESSION MICRODISCECTOMY 1 LEVEL;  Surgeon: Tia Alert, MD;  Location: [facility];  Service: Neurosurgery;  Laterality: Left;   MAXIMUM ACCESS (MAS)POSTERIOR LUMBAR INTERBODY FUSION (PLIF) 1 LEVEL N/A 06/18/2014   Procedure: MAXIMUM ACCESS SURGERY POSTERIOR LUMBAR INTERBODY FUSION LUMBAR FIVE TO SACRAL ONE ;  Surgeon: Tia Alert, MD;  Location: [facility];  Service: Neurosurgery;  Laterality: N/A;   SPINAL CORD STIMULATOR BATTERY EXCHANGE Right 07/30/2021   Procedure: Spinal cord stimulator battery replacement;  Surgeon: Tia Alert, MD;  Location: [facility];  Service: Neurosurgery;  Laterality: Right;   SPINAL CORD STIMULATOR IMPLANT     TOTAL KNEE ARTHROPLASTY Left 08/24/2020   Procedure: TOTAL  KNEE ARTHROPLASTY;  Surgeon: Salvatore Marvel, MD;  Location: [facility];  Service: Orthopedics;  Laterality: Left;   Patient Active Problem List   Diagnosis Date Noted   Lumbar burst fracture (HCC) 08/23/2022   Lumbar vertebral fracture (HCC) 08/19/2022   Chronic migraine w/o aura w/o status migrainosus, not intractable 03/29/2022   Mixed hyperlipidemia 12/27/2021   Chest pain of uncertain etiology 12/27/2021   New onset headache 12/22/2021   S/P lumbar fusion 07/30/2021   Primary localized osteoarthritis of left knee 08/11/2020   Atrophic vaginitis 08/11/2020   Menopausal symptom 08/11/2020   Restless leg syndrome    Chronic back pain    Low back pain without sciatica 11/01/2018   Essential hypertension    SBO (small bowel obstruction) (HCC) 09/01/2016   Fibromyalgia    Abdominal pain, vomiting, and diarrhea    Dehydration     Somnolence, daytime 10/22/2015   Fatigue 10/22/2015   Chronic leg pain 06/29/2015   S/P lumbar spinal fusion 06/18/2014   Abnormality of gait 02/18/2013   S/P lumbar microdiscectomy 01/24/2013   Hypokalemia 12/24/2012   Sweating abnormality 10/19/2012   Dysautonomia orthostatic hypotension syndrome 08/16/2012   Hereditary and idiopathic peripheral neuropathy 08/16/2012   Chronic adrenal insufficiency (HCC) 08/16/2012   MS (multiple sclerosis) (HCC) 08/15/2012    ONSET DATE: 08-17-22  REFERRING DIAG: G35 (ICD-10-CM) - Multiple sclerosis   Diagnosis  S32.039D (ICD-10-CM) - Closed fracture of third lumbar vertebra with routine healing, unspecified fracture morphology, subsequent encounter    THERAPY DIAG:  Muscle weakness (generalized)  MS (multiple sclerosis) (HCC)  Other abnormalities of gait and mobility  Unsteadiness on feet  Abnormality of gait  Difficulty in walking, not elsewhere classified  Rationale for Evaluation and Treatment: Rehabilitation  SUBJECTIVE:                                                                                                                                                                                             SUBJECTIVE STATEMENT: Pt denies any falls/near falls since she was last here. Patient leaving for [facility] for family reunion late September and is wanting to back to then. Patient reports that she hasn't been able to do her exercises yet this week.   Pt sees Dr. Yetta Barre on Thursday (neurosurgeon) and Dr. Berline Chough next week (8/28).  Pt accompanied by: self  PERTINENT HISTORY: spinal cord stimulator implant; MS diagnosed in 1984; s/p 2 back surgeries  Per Chart note "Brief HPI:   Katrina Rivera is a 77 y.o. female with a history of relapsing remitting multiple sclerosis who was evaluated by Dr. Yetta Barre regarding gait abnormality, chronic low  back pain and status post lumbar fusion May 2024. She has reportedly fallen 4 times at home  after PLIF of L3-5. She was admitted 08/17/2022 for scheduled surgery of L3 spinal fracture. She underwent posterior fixation L2-L4 using NuVasive cortical pedicle screws, arthrodesis and removal of segmental fixation L3-L5 by Dr. Yetta Barre. PT OT evaluations carried out. Incision is healed without signs of infection. She is appropriate back soreness. She has no complaints of leg pain or new numbness, tingling or weakness per Dr. Barnett Applebaum note today. Signs are stable and she is tolerating regular diet. Other past medical history includes restless leg syndrome, anxiety, headaches with migraine features, fibromyalgia, hypotension, anemia (acute on chronic)."    PAIN:  Are you having pain? Yes: NPRS scale: 5/10 Pain location: back pain Pain description: tightness, throbbing, sore Aggravating factors: no specific Relieving factors: Brace helps Pt also reports pain in legs  PRECAUTIONS: Back  - No bending, lifting >10#, twisting, reaching overhead; Dr Yetta Barre responded with "regular back precautions" via inbasket message inquiring about precautions  RED FLAGS: None   WEIGHT BEARING RESTRICTIONS: No  FALLS: Has patient fallen in last 6 months? Yes. Number of falls 4  LIVING ENVIRONMENT: Lives with: lives with their spouse Lives in: House/apartment Stairs: Yes: External: 1 steps; planning on having rail installed Has following equipment at home: Walker - 2 wheeled, shower chair, and Grab bars  PLOF: Independent with household mobility with device, Requires assistive device for independence, and Needs assistance with ADLs  PATIENT GOALS: "walk without walker and back brace and get back to where I was before"  OBJECTIVE:   DIAGNOSTIC FINDINGS: IMPRESSION: 1. Unhealed fracture of the posterosuperior L3 body, ans associated avulsed appearance of both L3 pedicles (series 7, image 30) along the course of the bilateral L3 pedicle screws. Mild retropulsion of the posterior body fragment does not result  in significant spinal stenosis. 2. No other acute osseous abnormality identified in the Lumbar Spine. But scant arthrodesis limited to only the right L5-S1 posterior elements following L3 through S1 fusion. Some endplate subsidence at each level. 3.  Aortic Atherosclerosis (ICD10-I70.0).  Nephrolithiasis.  COGNITION: Overall cognitive status: Within functional limits for tasks assessed   SENSATION: Impaired sensation in arms and legs - numbness and tingling  COORDINATION: WFL's - slowed movement due to back pain  POSTURE: rounded shoulders and forward head  LOWER EXTREMITY ROM:   WFL's bil. LE's   LOWER EXTREMITY MMT:  grossly 4/5 throughout but pain with resistance due to back pain (surgical site)  BED MOBILITY:  Independent   TRANSFERS: Assistive device utilized: Environmental consultant - 4 wheeled  Sit to stand: Modified independence Stand to sit: Modified independence  STAIRS:  TBA  GAIT: Gait pattern:  increased knee flexion bil. LE's in stance  and step through pattern Distance walked: 61' Assistive device utilized: Environmental consultant - 2 wheeled Level of assistance: SBA Comments: pt amb. With bil. Knees flexed  FUNCTIONAL TESTS:  5 times sit to stand: 21.47 secs from chair without UE support with close CGA Timed up and go (TUG): 21.47 secs with RW 10 meter walk test: 21.56 = 1.52 ft/sec with RW Berg Balance Scale: to be assessed  - pt stood for 2" without UE support with SBA  PATIENT SURVEYS:  N/A - dx is MS  TODAY'S TREATMENT:  VITALS Vitals:   09/29/22 0855  BP: (!) 141/71  Pulse: 70  BP assessed in sitting at start of session.   NMR:  NBOS of standing on foam EO 3 x 30" Standing on foam step taps to 6" box 2 x 10 Standing on foam step taps diagonal to 6" box 2 x 10  Between // bars with CGA: Foam block step overs 4 x 8 feet with bilateral UE  support lateral stepping Foam block step overs 2 x 8 feet with single UE support lateral stepping Foam block step overs 4 x 8 feet with no UE support lateral stepping Foam block step overs 4 x 8 feet with single UE support forward stepping Foam block step overs 4 x 8 feet with no UE support forward stepping Forward walking 1 x 8 feet with ball toss (CGA) Backward walking 1 x 8 feet with ball toss (CGA)  PATIENT EDUCATION: Education details: Continue HEP Person educated: Patient Education method: Chief Technology Officer Education comprehension: verbalized understanding  HOME EXERCISE PROGRAM: Access Code: [email or web address] URL: [email or web address] Date: 09/26/2022 Prepared by: Peter Congo  Exercises - Ankle Dorsiflexion with Resistance  - 1 x daily - 7 x weekly - 3 sets - 10 reps - Gastroc Stretch on Wall  - 1 x daily - 7 x weekly - 3 sets - 10 reps - Supine Knee Extension Stretch on Towel Roll  - 1 x daily - 7 x weekly - Supine Quad Set  - 1 x daily - 7 x weekly - 3 sets - 10 reps - Sit to Stand with Arms Crossed  - 1 x daily - 7 x weekly - 3 sets - 10 reps  GOALS: Goals reviewed with patient? Yes  SHORT TERM GOALS: Target date: 10-21-22  Improve Berg score by at least 4 points to reduce fall risk. Baseline: 30/56 (8/19) Goal status: INITIAL  2.  Pt will ambulate 230' without device with CGA on flat, even surface. Baseline:  Goal status: INITIAL  3.  Report decreased back pain to </= 3/10 intensity for increased ease & comfort with ADL's & mobility.  Baseline: 5/10 Goal status: INITIAL  4.  Improve 5x sit to stand score to </= 17 secs from chair without UE support to demo improved LE strength.  Baseline: 21.47 secs Goal status: INITIAL  5.  Improve TUG score to </= 17.5 secs with RW to demo improved functional mobility.  Baseline: 21.47 secs with RW Goal status: INITIAL  6.  Increase gait speed to >/= 1.9 ft/sec with RW with pt demonstrating bil. Knee  extension in stance.  Baseline: 1.52 ft/sec with RW (21.56 secs) Goal status: INITIAL   LONG TERM GOALS: Target date: 11-18-22  Improve Berg score by at least 8 points to reduce fall risk & demo improved balance. Baseline: 30/56 (8/19) Goal status: INITIAL  2.  Pt will be modified independent with household ambulation without device. Baseline:  Goal status: INITIAL  3.  Improve 5x sit to stand score to </= 14 secs from chair without UE support to demo improved LE strength.  Baseline: 21.47 secs Goal status: INITIAL  4.  Improve TUG score to </= 14.5 secs with RW to demo improved functional mobility.  Baseline: 21.47 secs with RW Goal status: INITIAL  5.   Increase gait speed to >/= 2.3 ft/sec with RW with pt demonstrating bil. Knee extension in stance.  Baseline: 1.52 ft/sec with RW (21.56 secs) Goal status: INITIAL  6.  Independent in HEP for  balance and LE strengthening and core stabilization exercises. Baseline:  Goal status: INITIAL  ASSESSMENT:  CLINICAL IMPRESSION: Emphasis of skilled PT session on progression of dynamic and static balance tasks; emphasized modified SLS with step over and step taps and balance on compliant surfaces. Initially more unstable due to increased anxiety and apprehension around balance tasks but improved with pacing and repetition. Continue POC.   OBJECTIVE IMPAIRMENTS: Abnormal gait, decreased activity tolerance, decreased balance, decreased strength, and pain.   ACTIVITY LIMITATIONS: carrying, lifting, bending, squatting, stairs, transfers, reach over head, and locomotion level  PARTICIPATION LIMITATIONS: meal prep, cleaning, laundry, shopping, community activity, and pt reports she does not drive  PERSONAL FACTORS: Past/current experiences, Time since onset of injury/illness/exacerbation, and 1 comorbidity: MS and s/p back surgery  are also affecting patient's functional outcome.   REHAB POTENTIAL: Good  CLINICAL DECISION MAKING:  Evolving/moderate complexity  EVALUATION COMPLEXITY: Moderate  PLAN:  PT FREQUENCY: 2x/week  PT DURATION: 8 weeks  PLANNED INTERVENTIONS: Therapeutic exercises, Therapeutic activity, Neuromuscular re-education, Balance training, Gait training, Patient/Family education, Self Care, Stair training, DME instructions, and Aquatic Therapy  PLAN FOR NEXT SESSION: watch BP for hypotension, begin gait training - try to increase knee extension in stance as pt amb. With both knees flexed; add to HEP PRN, balance based on BBS impairments (step-taps, SLS, modified tandem stance), LE strengthening, endurance  Maryruth Eve, PT, DPT 09/29/2022, 9:45 AM

## 2022-10-03 ENCOUNTER — Encounter: Payer: Self-pay | Admitting: Neurology

## 2022-10-04 ENCOUNTER — Ambulatory Visit: Payer: PPO | Admitting: Physical Therapy

## 2022-10-04 ENCOUNTER — Encounter: Payer: Self-pay | Admitting: Physical Therapy

## 2022-10-04 DIAGNOSIS — R262 Difficulty in walking, not elsewhere classified: Secondary | ICD-10-CM

## 2022-10-04 DIAGNOSIS — M6281 Muscle weakness (generalized): Secondary | ICD-10-CM

## 2022-10-04 DIAGNOSIS — R2681 Unsteadiness on feet: Secondary | ICD-10-CM

## 2022-10-04 DIAGNOSIS — R2689 Other abnormalities of gait and mobility: Secondary | ICD-10-CM | POA: Diagnosis not present

## 2022-10-04 NOTE — Therapy (Unsigned)
OUTPATIENT PHYSICAL THERAPY NEURO TREATMENT   Patient Name: Katrina Rivera MRN: [id number] DOB:Sep 09, 1945, 77 y.o., female Today's Date: 10/05/2022   PCP: Noberto Retort, MD REFERRING PROVIDER: Charlton Amor, PA-C  END OF SESSION:  PT End of Session - 10/04/22 1357     Visit Number 4    Number of Visits 17    Date for PT Re-Evaluation 11/18/22    Authorization Type [employer]    Progress Note Due on Visit 10    PT Start Time 1400    PT Stop Time 1446    PT Time Calculation (min) 46 min    Equipment Utilized During Treatment Gait belt    Activity Tolerance Patient tolerated treatment well    Behavior During Therapy WFL for tasks assessed/performed              Past Medical History:  Diagnosis Date   Addison's disease (HCC)    takes Solu Cortef daily   Anemia    takes Ferrous Sulfate daily   Anxiety    takes Xanax nightly   Arthritis    Chronic back pain    stenosis   Depression    takes Cymbalta daily   Dizziness    if b/p drops    Fibromyalgia    History of blood transfusion    no abnormal reaction noted   History of bronchitis    many yrs ago    Hypokalemia    takes Potassium daily   Hypotension    takes Florinef daily   IBS (irritable bowel syndrome)    takes Align daily   Insomnia    takes Trazodone nightly   Joint pain    Multiple sclerosis (HCC)    doesn't take any meds   Multiple sclerosis (HCC)    New onset headache 12/22/2021   Nocturia    Osteoporosis    Palpitations    Peripheral neuropathy    Primary localized osteoarthritis of left knee 08/11/2020   Restless leg syndrome    Seasonal allergies    takes Zyrtec daily;uses Flonase daily as needed   Syncope    when missed methylprednisolone dose   Urinary frequency    takes Flomax daily   Weakness    numbness and tingling   Past Surgical History:  Procedure Laterality Date   ABDOMINAL HYSTERECTOMY  02/07/1986   APPENDECTOMY  02/07/1986   CESAREAN SECTION   1973/1977   x2   CHOLECYSTECTOMY  02/08/1995   COLECTOMY  02/08/1988   COLONOSCOPY     ESOPHAGOGASTRODUODENOSCOPY     EYE SURGERY     bilateral - /w IOL- cataracts   FRACTURE SURGERY Right    rods and screws-right leg   LUMBAR LAMINECTOMY/DECOMPRESSION MICRODISCECTOMY Left 01/24/2013   Procedure: LUMBAR FIVE TO SACRAL ONE LUMBAR LAMINECTOMY/DECOMPRESSION MICRODISCECTOMY 1 LEVEL;  Surgeon: Tia Alert, MD;  Location: [facility];  Service: Neurosurgery;  Laterality: Left;   MAXIMUM ACCESS (MAS)POSTERIOR LUMBAR INTERBODY FUSION (PLIF) 1 LEVEL N/A 06/18/2014   Procedure: MAXIMUM ACCESS SURGERY POSTERIOR LUMBAR INTERBODY FUSION LUMBAR FIVE TO SACRAL ONE ;  Surgeon: Tia Alert, MD;  Location: [facility];  Service: Neurosurgery;  Laterality: N/A;   SPINAL CORD STIMULATOR BATTERY EXCHANGE Right 07/30/2021   Procedure: Spinal cord stimulator battery replacement;  Surgeon: Tia Alert, MD;  Location: [facility] OR;  Service: Neurosurgery;  Laterality: Right;   SPINAL CORD STIMULATOR IMPLANT     TOTAL KNEE ARTHROPLASTY Left 08/24/2020   Procedure: TOTAL KNEE  ARTHROPLASTY;  Surgeon: Salvatore Marvel, MD;  Location: [facility];  Service: Orthopedics;  Laterality: Left;   Patient Active Problem List   Diagnosis Date Noted   Lumbar burst fracture (HCC) 08/23/2022   Lumbar vertebral fracture (HCC) 08/19/2022   Chronic migraine w/o aura w/o status migrainosus, not intractable 03/29/2022   Mixed hyperlipidemia 12/27/2021   Chest pain of uncertain etiology 12/27/2021   New onset headache 12/22/2021   S/P lumbar fusion 07/30/2021   Primary localized osteoarthritis of left knee 08/11/2020   Atrophic vaginitis 08/11/2020   Menopausal symptom 08/11/2020   Restless leg syndrome    Chronic back pain    Low back pain without sciatica 11/01/2018   Essential hypertension    SBO (small bowel obstruction) (HCC) 09/01/2016   Fibromyalgia    Abdominal pain, vomiting, and diarrhea    Dehydration    Somnolence,  daytime 10/22/2015   Fatigue 10/22/2015   Chronic leg pain 06/29/2015   S/P lumbar spinal fusion 06/18/2014   Abnormality of gait 02/18/2013   S/P lumbar microdiscectomy 01/24/2013   Hypokalemia 12/24/2012   Sweating abnormality 10/19/2012   Dysautonomia orthostatic hypotension syndrome 08/16/2012   Hereditary and idiopathic peripheral neuropathy 08/16/2012   Chronic adrenal insufficiency (HCC) 08/16/2012   MS (multiple sclerosis) (HCC) 08/15/2012    ONSET DATE: 08-17-22  REFERRING DIAG: G35 (ICD-10-CM) - Multiple sclerosis   Diagnosis  S32.039D (ICD-10-CM) - Closed fracture of third lumbar vertebra with routine healing, unspecified fracture morphology, subsequent encounter    THERAPY DIAG:  Unsteadiness on feet  Difficulty in walking, not elsewhere classified  Muscle weakness (generalized)  Rationale for Evaluation and Treatment: Rehabilitation  SUBJECTIVE:                                                                                                                                                                                             SUBJECTIVE STATEMENT: Pt states she rode her bike for 30" and then didn't do any other exercises; is going on trip to [facility] in 2 weeks to visit family - isn't planning on taking her RW but she doesn't want anything to happen to it  Pt sees Dr. Yetta Barre on Thursday (neurosurgeon) and Dr. Berline Chough next week (8/28).  Pt accompanied by: self  PERTINENT HISTORY: spinal cord stimulator implant; MS diagnosed in 1984; s/p 2 back surgeries  Per Chart note "Brief HPI:   TOKA NIWA is a 77 y.o. female with a history of relapsing remitting multiple sclerosis who was evaluated by Dr. Yetta Barre regarding gait abnormality, chronic low back pain and status post lumbar fusion May 2024. She has reportedly fallen 4  times at home after PLIF of L3-5. She was admitted 08/17/2022 for scheduled surgery of L3 spinal fracture. She underwent posterior fixation  L2-L4 using NuVasive cortical pedicle screws, arthrodesis and removal of segmental fixation L3-L5 by Dr. Yetta Barre. PT OT evaluations carried out. Incision is healed without signs of infection. She is appropriate back soreness. She has no complaints of leg pain or new numbness, tingling or weakness per Dr. Barnett Applebaum note today. Signs are stable and she is tolerating regular diet. Other past medical history includes restless leg syndrome, anxiety, headaches with migraine features, fibromyalgia, hypotension, anemia (acute on chronic)."    PAIN:  Are you having pain? Yes: NPRS scale: 6/10 Pain location: back pain - radiating down Lt leg today (10-04-22) Pain description: tightness, throbbing, sore Aggravating factors: no specific Relieving factors: Brace helps Pt also reports pain in legs  PRECAUTIONS: Back  - No bending, lifting >10#, twisting, reaching overhead; Dr Yetta Barre responded with "regular back precautions" via inbasket message inquiring about precautions  RED FLAGS: None   WEIGHT BEARING RESTRICTIONS: No  FALLS: Has patient fallen in last 6 months? Yes. Number of falls 4  LIVING ENVIRONMENT: Lives with: lives with their spouse Lives in: House/apartment Stairs: Yes: External: 1 steps; planning on having rail installed Has following equipment at home: Walker - 2 wheeled, shower chair, and Grab bars  PLOF: Independent with household mobility with device, Requires assistive device for independence, and Needs assistance with ADLs  PATIENT GOALS: "walk without walker and back brace and get back to where I was before"  OBJECTIVE:   DIAGNOSTIC FINDINGS: IMPRESSION: 1. Unhealed fracture of the posterosuperior L3 body, ans associated avulsed appearance of both L3 pedicles (series 7, image 30) along the course of the bilateral L3 pedicle screws. Mild retropulsion of the posterior body fragment does not result in significant spinal stenosis. 2. No other acute osseous abnormality identified in  the Lumbar Spine. But scant arthrodesis limited to only the right L5-S1 posterior elements following L3 through S1 fusion. Some endplate subsidence at each level. 3.  Aortic Atherosclerosis (ICD10-I70.0).  Nephrolithiasis.  COGNITION: Overall cognitive status: Within functional limits for tasks assessed   SENSATION: Impaired sensation in arms and legs - numbness and tingling  COORDINATION: WFL's - slowed movement due to back pain  POSTURE: rounded shoulders and forward head  LOWER EXTREMITY ROM:   WFL's bil. LE's   LOWER EXTREMITY MMT:  grossly 4/5 throughout but pain with resistance due to back pain (surgical site)  BED MOBILITY:  Independent   TRANSFERS: Assistive device utilized: Environmental consultant - 4 wheeled  Sit to stand: Modified independence Stand to sit: Modified independence  STAIRS:  TBA  GAIT: Gait pattern:  increased knee flexion bil. LE's in stance  and step through pattern Distance walked: 59' Assistive device utilized: Environmental consultant - 2 wheeled Level of assistance: SBA Comments: pt amb. With bil. Knees flexed  FUNCTIONAL TESTS:  5 times sit to stand: 21.47 secs from chair without UE support with close CGA Timed up and go (TUG): 21.47 secs with RW 10 meter walk test: 21.56 = 1.52 ft/sec with RW Berg Balance Scale: to be assessed  - pt stood for 2" without UE support with SBA  PATIENT SURVEYS:  N/A - dx is MS  TODAY'S TREATMENT:  TherEx:  Pt performed sit to stand transfers from chair without UE support 5 times Step ups RLE and LLE 10x each leg onto 6" step with minimal UE support with CGA for bil. LE strengthening  NeuroRe-ed: Marching 10 reps each leg - performed inside // bars- no UE support used - CGA for safety  Forward stepping 10 reps each leg, lateral stepping out/in 10 reps each leg, and then backward stepping 10 reps each leg with  CGA  Rockerboard anterior/posterior 10 reps each with UE support prn with CGA Stepping over and back of balance beam (black) 10 reps each leg with UE support prn  Gait:   Pt gait trained without device 115' x 2 reps, then 115' x1 rep at end of session with CGA to min assist; cues to lift LLE as Lt foot occasionally scuffs floor  Step training - pt negotiated 4 steps with use of hand rails using step over step sequence with ascension and step by step sequence with descension   HEP issued: 10-04-22 Access Code: [email or web address] URL: [email or web address] Date: 10/05/2022 Prepared by: Maebelle Munroe  Exercises - Alternating Step Forward with Support  - 1 x daily - 7 x weekly - 1 sets - 10 reps - Step Sideways  - 1 x daily - 7 x weekly - 1 sets - 10 reps - Alternating Step Backward with Support  - 1 x daily - 7 x weekly - 1 sets - 10 reps - Side Stepping with Counter Support  - 1 x daily - 7 x weekly - 1 sets - 2-3 reps - Standing March with Counter Support  - 1 x daily - 7 x weekly - 1 sets - 10 reps  PATIENT EDUCATION: Education details: Continue HEP; updated HEP provided to include balance exercises (see above)  Person educated: Patient Education method: Explanation and Handouts Education comprehension: verbalized understanding  HOME EXERCISE PROGRAM: Access Code: [email or web address] URL: [email or web address] Date: 09/26/2022 Prepared by: Peter Congo  Exercises - Ankle Dorsiflexion with Resistance  - 1 x daily - 7 x weekly - 3 sets - 10 reps - Gastroc Stretch on Wall  - 1 x daily - 7 x weekly - 3 sets - 10 reps - Supine Knee Extension Stretch on Towel Roll  - 1 x daily - 7 x weekly - Supine Quad Set  - 1 x daily - 7 x weekly - 3 sets - 10 reps - Sit to Stand with Arms Crossed  - 1 x daily - 7 x weekly - 3 sets - 10 reps  GOALS: Goals reviewed with patient? Yes  SHORT TERM GOALS: Target date: 10-21-22  Improve Berg score by at least 4 points to reduce fall  risk. Baseline: 30/56 (8/19) Goal status: INITIAL  2.  Pt will ambulate 230' without device with CGA on flat, even surface. Baseline:  Goal status: INITIAL  3.  Report decreased back pain to </= 3/10 intensity for increased ease & comfort with ADL's & mobility.  Baseline: 5/10 Goal status: INITIAL  4.  Improve 5x sit to stand score to </= 17 secs from chair without UE support to demo improved LE strength.  Baseline: 21.47 secs Goal status: INITIAL  5.  Improve TUG score to </= 17.5 secs with RW to demo improved functional mobility.  Baseline: 21.47 secs with RW Goal status: INITIAL  6.  Increase gait speed to >/= 1.9 ft/sec with RW with pt demonstrating bil. Knee extension in stance.  Baseline: 1.52 ft/sec with RW (21.56 secs)  Goal status: INITIAL   LONG TERM GOALS: Target date: 11-18-22  Improve Berg score by at least 8 points to reduce fall risk & demo improved balance. Baseline: 30/56 (8/19) Goal status: INITIAL  2.  Pt will be modified independent with household ambulation without device. Baseline:  Goal status: INITIAL  3.  Improve 5x sit to stand score to </= 14 secs from chair without UE support to demo improved LE strength.  Baseline: 21.47 secs Goal status: INITIAL  4.  Improve TUG score to </= 14.5 secs with RW to demo improved functional mobility.  Baseline: 21.47 secs with RW Goal status: INITIAL  5.   Increase gait speed to >/= 2.3 ft/sec with RW with pt demonstrating bil. Knee extension in stance.  Baseline: 1.52 ft/sec with RW (21.56 secs) Goal status: INITIAL  6.  Independent in HEP for balance and LE strengthening and core stabilization exercises. Baseline:  Goal status: INITIAL  ASSESSMENT:  CLINICAL IMPRESSION: PT session focused on balance training to improved SLS on each leg and to improve dynamic standing balance.  HEP updated to include balance exercises with pt verbalizing understanding and agreement that need for balance exercises was  very important and that significant fear of falling contributed to her balance deficit.  Lt foot noted to scuff floor occasionally but pt able to minimize scuffing with increased hip flexion in swing phase of gait.  Continue POC.   OBJECTIVE IMPAIRMENTS: Abnormal gait, decreased activity tolerance, decreased balance, decreased strength, and pain.   ACTIVITY LIMITATIONS: carrying, lifting, bending, squatting, stairs, transfers, reach over head, and locomotion level  PARTICIPATION LIMITATIONS: meal prep, cleaning, laundry, shopping, community activity, and pt reports she does not drive  PERSONAL FACTORS: Past/current experiences, Time since onset of injury/illness/exacerbation, and 1 comorbidity: MS and s/p back surgery  are also affecting patient's functional outcome.   REHAB POTENTIAL: Good  CLINICAL DECISION MAKING: Evolving/moderate complexity  EVALUATION COMPLEXITY: Moderate  PLAN:  PT FREQUENCY: 2x/week  PT DURATION: 8 weeks  PLANNED INTERVENTIONS: Therapeutic exercises, Therapeutic activity, Neuromuscular re-education, Balance training, Gait training, Patient/Family education, Self Care, Stair training, DME instructions, and Aquatic Therapy  PLAN FOR NEXT SESSION: check updated HEP:  begin gait training - try to increase knee extension in stance as pt amb. With both knees flexed; add to HEP PRN, LE strengthening, endurance  Kerry Fort, PT 10/05/2022, 2:16 PM

## 2022-10-05 ENCOUNTER — Encounter: Payer: Self-pay | Admitting: Physical Therapy

## 2022-10-05 ENCOUNTER — Encounter: Payer: PPO | Attending: Physical Medicine and Rehabilitation | Admitting: Physical Medicine and Rehabilitation

## 2022-10-05 ENCOUNTER — Encounter: Payer: Self-pay | Admitting: Physical Medicine and Rehabilitation

## 2022-10-05 VITALS — BP 147/74 | HR 73 | Ht 60.0 in | Wt 110.6 lb

## 2022-10-05 DIAGNOSIS — R42 Dizziness and giddiness: Secondary | ICD-10-CM | POA: Diagnosis not present

## 2022-10-05 DIAGNOSIS — M7918 Myalgia, other site: Secondary | ICD-10-CM | POA: Diagnosis not present

## 2022-10-05 DIAGNOSIS — G35 Multiple sclerosis: Secondary | ICD-10-CM

## 2022-10-05 DIAGNOSIS — Z981 Arthrodesis status: Secondary | ICD-10-CM | POA: Diagnosis not present

## 2022-10-05 DIAGNOSIS — R269 Unspecified abnormalities of gait and mobility: Secondary | ICD-10-CM | POA: Diagnosis not present

## 2022-10-05 DIAGNOSIS — S32030G Wedge compression fracture of third lumbar vertebra, subsequent encounter for fracture with delayed healing: Secondary | ICD-10-CM | POA: Diagnosis not present

## 2022-10-05 MED ORDER — LIDOCAINE HCL 1 % IJ SOLN
9.0000 mL | Freq: Once | INTRAMUSCULAR | Status: AC
Start: 2022-10-05 — End: 2022-10-05
  Administered 2022-10-05: 9 mL

## 2022-10-05 NOTE — Patient Instructions (Addendum)
Pt is a 77 yr old female with hx of L3 fx with L2-L4 fixation and removal of prior hardware- recent fusion prior to L3 fx; with hx of relapsing remitting MS; Migraines on CGRP injections and Nurtec; Hypotension; IBS and Neurogenic bladder due to MS as well as Addison's disease on medrol daily.   Patient is here for hospital f/u on back L3 fx as well as trigger point injections.    Wearing TLSO corset for 6 months- but when it's time ot wean off- will cause more pain, since muscles will get weaker from wearing corset brace.  Try to take back support pillow for car/airplane on vacation.   Increased risk of clots on flight- so move legs a lot!-    3. Prednisone/steroids makes skin thin ,so easy bruising and skin tears are likely for that. .    4. Patient here for trigger point injections for vertigo/myofascial pain  Consent done and on chart.  Cleaned areas with alcohol and injected using a 27 gauge 1.5 inch needle  Injected  9 cc- none wasted Using 1% Lidocaine with no EPI  Upper traps B/L  Levators B/L  Posterior scalenes B/L  Middle scalenes B/L x2- post auricular Splenius Capitus B/L  Pectoralis Major- B/L  Rhomboids B/L x3 Infraspinatus Teres Major/minor Thoracic paraspinals B/l x2 Lumbar paraspinals-B/L x3 Other injections- B/L thighs- vastus lateralis.    Patient's level of pain prior was Current level of pain after injections is  There was no bleeding or complications.  Patient was advised to drink a lot of water on day after injections to flush system Will have increased soreness for 12-48 hours after injections.  Can use Lidocaine patches the day AFTER injections Can use theracane on day of injections in places didn't inject Can use heating pad 4-6 hours AFTER injections   5. Pain meds via [facility]-    6.  F/U in 6 weeks- - make multiple appointments- every 6 weeks. For Trp injections.   7. Con't Florinef for orthostatic hypotension- and low dose Norvasc for HTN  when laying/sitting.   8. C/O elevated heart rate- suggest calming activities and not standing- a compensation for low BP- check your BP as wlel.

## 2022-10-05 NOTE — Progress Notes (Signed)
Subjective:    Patient ID: Katrina Rivera, female    DOB: 1945/03/27, 77 y.o.   MRN: [id number]  HPI Pt is a 77 yr old female with hx of L3 fx with L2-L4 fixation and removal of prior hardware- recent fusion prior to L3 fx; with hx of relapsing remitting MS; Migraines on CGRP injections and Nurtec; Hypotension; IBS and Neurogenic bladder due to MS as well as Addison's disease on medrol daily. Did trigger point injections in hospital for pain and vertigo- surgery done in 08/2022.   Patient is here for hospital f/u on back L3 fx as well as trigger point injections.    Trigger point injections helped vertigo and pain- but wore off-  1 week ago.   Got really dizzy in PT- 1 week ago and things actually went black.  Poor balance-  Doing outpatient PT [facility] [address]- 2x/week-    Quit CGRP injections since weren't helping and could have contributed to dizziness/Vertigo  Going on 10 days trip-  going to [place]- got w/c at airport and hotel.   Pain 6/10 today- is pretty normal for her- hurts a lot more with brace off, so puts brace back on.   Still having pain down LLE- radiates down to toes.   Still taking pain meds- Dr Yetta Barre is refilling it.  Still feels weak in LLE- but hasn't buckled lately.   Trigger finger- got brace from Ortho- needs surgery but will do when gets back.  When holds things, drops them sometimes on R hand as well-  Hx of carpal tunnel syndrome.      Pain Inventory Average Pain 5 Pain Right Now 5 My pain is dull, stabbing, and tingling  In the last 24 hours, has pain interfered with the following? General activity 6 Relation with others 5 Enjoyment of life 5 What TIME of day is your pain at its worst? varies Sleep (in general) Fair  Pain is worse with: walking, sitting, and inactivity Pain improves with: therapy/exercise and medication Relief from Meds: 5  walk with assistance use a walker how many minutes can you walk? 5-10 ability to climb steps?  no do  you drive?  no  disabled: date disabled 10/07/1989 I need assistance with the following:  bathing, household duties, and shopping  bladder control problems bowel control problems weakness numbness tremor tingling trouble walking spasms dizziness anxiety  Any changes since last visit?  no  Any changes since last visit?  no    Family History  Problem Relation Age of Onset   Hypertension Mother    Stroke Mother    Heart attack Father    Tremor Brother    Social History   Socioeconomic History   Marital status: Married    Spouse name: [name]   Number of children: 2   Years of education: 12   Highest education level: Not on file  Occupational History    Employer: DISABLED    Comment: Disabled  Tobacco Use   Smoking status: Never   Smokeless tobacco: Never  Vaping Use   Vaping status: Never Used  Substance and Sexual Activity   Alcohol use: Never   Drug use: Never   Sexual activity: Not on file  Other Topics Concern   Not on file  Social History Narrative   Pt lives at home with spouse. Gabriel Rung) [phone] (patient's cell)   [name]'s cell  [phone]      Caffeine Use: 1 cups daily.Right handed.Disabled.Education - high schoolPatient has two  adult children.   Social Determinants of Health   Financial Resource Strain: Not on file  Food Insecurity: No Food Insecurity (08/17/2022)   Hunger Vital Sign    Worried About Running Out of Food in the Last Year: Never true    Ran Out of Food in the Last Year: Never true  Transportation Needs: Not on file  Physical Activity: Not on file  Stress: Not on file  Social Connections: Not on file   Past Surgical History:  Procedure Laterality Date   ABDOMINAL HYSTERECTOMY  02/07/1986   APPENDECTOMY  02/07/1986   CESAREAN SECTION  1973/1977   x2   CHOLECYSTECTOMY  02/08/1995   COLECTOMY  02/08/1988   COLONOSCOPY     ESOPHAGOGASTRODUODENOSCOPY     EYE SURGERY     bilateral - /w IOL- cataracts   FRACTURE SURGERY Right     rods and screws-right leg   LUMBAR LAMINECTOMY/DECOMPRESSION MICRODISCECTOMY Left 01/24/2013   Procedure: LUMBAR FIVE TO SACRAL ONE LUMBAR LAMINECTOMY/DECOMPRESSION MICRODISCECTOMY 1 LEVEL;  Surgeon: Tia Alert, MD;  Location: [facility];  Service: Neurosurgery;  Laterality: Left;   MAXIMUM ACCESS (MAS)POSTERIOR LUMBAR INTERBODY FUSION (PLIF) 1 LEVEL N/A 06/18/2014   Procedure: MAXIMUM ACCESS SURGERY POSTERIOR LUMBAR INTERBODY FUSION LUMBAR FIVE TO SACRAL ONE ;  Surgeon: Tia Alert, MD;  Location: [facility];  Service: Neurosurgery;  Laterality: N/A;   SPINAL CORD STIMULATOR BATTERY EXCHANGE Right 07/30/2021   Procedure: Spinal cord stimulator battery replacement;  Surgeon: Tia Alert, MD;  Location[facility];  Service: Neurosurgery;  Laterality: Right;   SPINAL CORD STIMULATOR IMPLANT     TOTAL KNEE ARTHROPLASTY Left 08/24/2020   Procedure: TOTAL KNEE ARTHROPLASTY;  Surgeon: Salvatore Marvel, MD;  Location: [facility];  Service: Orthopedics;  Laterality: Left;   Past Medical History:  Diagnosis Date   Addison's disease (HCC)    takes Solu Cortef daily   Anemia    takes Ferrous Sulfate daily   Anxiety    takes Xanax nightly   Arthritis    Chronic back pain    stenosis   Depression    takes Cymbalta daily   Dizziness    if b/p drops    Fibromyalgia    History of blood transfusion    no abnormal reaction noted   History of bronchitis    many yrs ago    Hypokalemia    takes Potassium daily   Hypotension    takes Florinef daily   IBS (irritable bowel syndrome)    takes Align daily   Insomnia    takes Trazodone nightly   Joint pain    Multiple sclerosis (HCC)    doesn't take any meds   Multiple sclerosis (HCC)    New onset headache 12/22/2021   Nocturia    Osteoporosis    Palpitations    Peripheral neuropathy    Primary localized osteoarthritis of left knee 08/11/2020   Restless leg syndrome    Seasonal allergies    takes Zyrtec daily;uses Flonase daily as needed    Syncope    when missed methylprednisolone dose   Urinary frequency    takes Flomax daily   Weakness    numbness and tingling   BP (!) 146/78   Pulse 73   Ht 5' (1.524 m)   Wt 110 lb 9.6 oz (50.2 kg)   SpO2 98%   BMI 21.60 kg/m   Opioid Risk Score:   Fall Risk Score:  `1  Depression screen [facility] 2/9  10/05/2022    9:02 AM  Depression screen PHQ 2/9  Decreased Interest 0  Down, Depressed, Hopeless 1  PHQ - 2 Score 1  Altered sleeping 1  Tired, decreased energy 1  Change in appetite 1  Feeling bad or failure about yourself  0  Trouble concentrating 1  Moving slowly or fidgety/restless 1  Suicidal thoughts 0  PHQ-9 Score 6  Difficult doing work/chores Somewhat difficult    Review of Systems  Constitutional: Negative.   HENT: Negative.    Respiratory: Negative.    Cardiovascular: Negative.   Gastrointestinal:        Bowel control  Endocrine: Negative.   Genitourinary:        Bladder control  Musculoskeletal:  Positive for back pain and gait problem.  Skin: Negative.   Allergic/Immunologic: Negative.   Neurological:  Positive for weakness.  Hematological: Negative.   Psychiatric/Behavioral:  The patient is nervous/anxious.   All other systems reviewed and are negative.      Objective:   Physical Exam  Awake, alert, appropriate, accompanied by husband, using RW to get around; wearing corset TLSO, NAD Swaying already with feet together, but posterior lean severe with closing eyes       Assessment & Plan:   Pt is a 77 yr old female with hx of L3 fx with L2-L4 fixation and removal of prior hardware- recent fusion prior to L3 fx; with hx of relapsing remitting MS; Migraines on CGRP injections and Nurtec; Hypotension; IBS and Neurogenic bladder due to MS as well as Addison's disease on medrol daily.   Patient is here for hospital f/u on back L3 fx as well as trigger point injections.    Wearing TLSO corset for 6 months- but when it's time ot wean off-  will cause more pain, since muscles will get weaker from wearing corset brace.  Try to take back support pillow for car/airplane on vacation.     3. Prednisone/steroids makes skin thin ,so easy bruising and skin tears are likely for that. .    4. Patient here for trigger point injections for vertigo/myofascial pain  Consent done and on chart.  Cleaned areas with alcohol and injected using a 27 gauge 1.5 inch needle  Injected  9 cc- none wasted Using 1% Lidocaine with no EPI  Upper traps B/L  Levators B/L  Posterior scalenes B/L  Middle scalenes B/L x2- post auricular Splenius Capitus B/L  Pectoralis Major- B/L  Rhomboids B/L x3 Infraspinatus Teres Major/minor Thoracic paraspinals B/l x2 Lumbar paraspinals-B/L x3 Other injections- B/L thighs- vastus lateralis.    Patient's level of pain prior was 6/10 Current level of pain after injections is  4/10 after injections.   There was no bleeding or complications.  Patient was advised to drink a lot of water on day after injections to flush system Will have increased soreness for 12-48 hours after injections.  Can use Lidocaine patches the day AFTER injections Can use theracane on day of injections in places didn't inject Can use heating pad 4-6 hours AFTER injections   5. Pain meds via [facility]-    6.  F/U in 6 weeks- - make multiple appointments- every 6 weeks. For Trp injections.   7. Con't Florinef for orthostatic hypotension- and low dose Norvasc for HTN when laying/sitting.   8. C/O elevated heart rate- suggest calming activities and not standing- a compensation for low BP- check your BP as wlel.   9. Will do injections q6 weeks- to keep vertigo and  pain better controlled.      I spent a total of 38   minutes on total care today- >50% coordination of care- due to 8 minutes on trigger points- rest on discussing flight/vacation and how to deal with that- and skin issues and ain control.

## 2022-10-06 ENCOUNTER — Encounter: Payer: Self-pay | Admitting: Physical Therapy

## 2022-10-06 ENCOUNTER — Ambulatory Visit: Payer: PPO | Admitting: Physical Therapy

## 2022-10-06 VITALS — BP 146/70 | HR 93

## 2022-10-06 DIAGNOSIS — M6281 Muscle weakness (generalized): Secondary | ICD-10-CM

## 2022-10-06 DIAGNOSIS — R2681 Unsteadiness on feet: Secondary | ICD-10-CM

## 2022-10-06 DIAGNOSIS — R2689 Other abnormalities of gait and mobility: Secondary | ICD-10-CM

## 2022-10-06 NOTE — Therapy (Signed)
OUTPATIENT PHYSICAL THERAPY NEURO TREATMENT   Patient Name: Katrina Rivera MRN: [id number] DOB:02-08-1946, 77 y.o., female Today's Date: 10/07/2022   PCP: Katrina Retort, MD REFERRING PROVIDER: Charlton Amor, PA-C  END OF SESSION:  PT End of Session - 10/07/22 1509     Visit Number 5    Number of Visits 17    Date for PT Re-Evaluation 11/18/22    Authorization Type [employer]    Progress Note Due on Visit 10    PT Start Time 1403    PT Stop Time 1447    PT Time Calculation (min) 44 min    Equipment Utilized During Treatment Gait belt    Activity Tolerance Patient tolerated treatment well    Behavior During Therapy WFL for tasks assessed/performed               Past Medical History:  Diagnosis Date   Addison's disease (HCC)    takes Solu Cortef daily   Anemia    takes Ferrous Sulfate daily   Anxiety    takes Xanax nightly   Arthritis    Chronic back pain    stenosis   Depression    takes Cymbalta daily   Dizziness    if b/p drops    Fibromyalgia    History of blood transfusion    no abnormal reaction noted   History of bronchitis    many yrs ago    Hypokalemia    takes Potassium daily   Hypotension    takes Florinef daily   IBS (irritable bowel syndrome)    takes Align daily   Insomnia    takes Trazodone nightly   Joint pain    Multiple sclerosis (HCC)    doesn't take any meds   Multiple sclerosis (HCC)    New onset headache 12/22/2021   Nocturia    Osteoporosis    Palpitations    Peripheral neuropathy    Primary localized osteoarthritis of left knee 08/11/2020   Restless leg syndrome    Seasonal allergies    takes Zyrtec daily;uses Flonase daily as needed   Syncope    when missed methylprednisolone dose   Urinary frequency    takes Flomax daily   Weakness    numbness and tingling   Past Surgical History:  Procedure Laterality Date   ABDOMINAL HYSTERECTOMY  02/07/1986   APPENDECTOMY  02/07/1986   CESAREAN SECTION   1973/1977   x2   CHOLECYSTECTOMY  02/08/1995   COLECTOMY  02/08/1988   COLONOSCOPY     ESOPHAGOGASTRODUODENOSCOPY     EYE SURGERY     bilateral - /w IOL- cataracts   FRACTURE SURGERY Right    rods and screws-right leg   LUMBAR LAMINECTOMY/DECOMPRESSION MICRODISCECTOMY Left 01/24/2013   Procedure: LUMBAR FIVE TO SACRAL ONE LUMBAR LAMINECTOMY/DECOMPRESSION MICRODISCECTOMY 1 LEVEL;  Surgeon: Katrina Alert, MD;  Location: [facility];  Service: Neurosurgery;  Laterality: Left;   MAXIMUM ACCESS (MAS)POSTERIOR LUMBAR INTERBODY FUSION (PLIF) 1 LEVEL N/A 06/18/2014   Procedure: MAXIMUM ACCESS SURGERY POSTERIOR LUMBAR INTERBODY FUSION LUMBAR FIVE TO SACRAL ONE ;  Surgeon: Katrina Alert, MD;  Location: [facility];  Service: Neurosurgery;  Laterality: N/A;   SPINAL CORD STIMULATOR BATTERY EXCHANGE Right 07/30/2021   Procedure: Spinal cord stimulator battery replacement;  Surgeon: Katrina Alert, MD;  Location: [facility];  Service: Neurosurgery;  Laterality: Right;   SPINAL CORD STIMULATOR IMPLANT     TOTAL KNEE ARTHROPLASTY Left 08/24/2020   Procedure: TOTAL  KNEE ARTHROPLASTY;  Surgeon: Katrina Marvel, MD;  Location: [facility];  Service: Orthopedics;  Laterality: Left;   Patient Active Problem List   Diagnosis Date Noted   Lumbar burst fracture (HCC) 08/23/2022   Lumbar vertebral fracture (HCC) 08/19/2022   Chronic migraine w/o aura w/o status migrainosus, not intractable 03/29/2022   Mixed hyperlipidemia 12/27/2021   Chest pain of uncertain etiology 12/27/2021   New onset headache 12/22/2021   S/P lumbar fusion 07/30/2021   Primary localized osteoarthritis of left knee 08/11/2020   Atrophic vaginitis 08/11/2020   Menopausal symptom 08/11/2020   Restless leg syndrome    Chronic back pain    Low back pain without sciatica 11/01/2018   Essential hypertension    SBO (small bowel obstruction) (HCC) 09/01/2016   Fibromyalgia    Abdominal pain, vomiting, and diarrhea    Dehydration    Somnolence,  daytime 10/22/2015   Fatigue 10/22/2015   Chronic leg pain 06/29/2015   S/P lumbar spinal fusion 06/18/2014   Abnormality of gait 02/18/2013   S/P lumbar microdiscectomy 01/24/2013   Hypokalemia 12/24/2012   Sweating abnormality 10/19/2012   Dysautonomia orthostatic hypotension syndrome 08/16/2012   Hereditary and idiopathic peripheral neuropathy 08/16/2012   Chronic adrenal insufficiency (HCC) 08/16/2012   MS (multiple sclerosis) (HCC) 08/15/2012    ONSET DATE: 08-17-22  REFERRING DIAG: G35 (ICD-10-CM) - Multiple sclerosis   Diagnosis  S32.039D (ICD-10-CM) - Closed fracture of third lumbar vertebra with routine healing, unspecified fracture morphology, subsequent encounter    THERAPY DIAG:  Unsteadiness on feet  Other abnormalities of gait and mobility  Muscle weakness (generalized)  Rationale for Evaluation and Treatment: Rehabilitation  SUBJECTIVE:                                                                                                                                                                                             SUBJECTIVE STATEMENT: Pt reports she saw Katrina Rivera yesterday - says she did trigger point injections in her shoulders (upper traps) and also in her Lt hip and leg  Pt accompanied by: self  PERTINENT HISTORY: spinal cord stimulator implant; MS diagnosed in 1984; s/p 2 back surgeries  Per Chart note "Brief HPI:   Katrina Rivera is a 77 y.o. female with a history of relapsing remitting multiple sclerosis who was evaluated by Katrina Rivera regarding gait abnormality, chronic low back pain and status post lumbar fusion May 2024. She has reportedly fallen 4 times at home after PLIF of L3-5. She was admitted 08/17/2022 for scheduled surgery of L3 spinal fracture. She underwent posterior fixation L2-L4 using NuVasive cortical pedicle screws, arthrodesis and  removal of segmental fixation L3-L5 by Katrina Rivera. PT OT evaluations carried out. Incision is healed  without signs of infection. She is appropriate back soreness. She has no complaints of leg pain or new numbness, tingling or weakness per [facility] note today. Signs are stable and she is tolerating regular diet. Other past medical history includes restless leg syndrome, anxiety, headaches with migraine features, fibromyalgia, hypotension, anemia (acute on chronic)."    PAIN:  Are you having pain? Yes: NPRS scale: 6/10 Pain location: back pain  Pain description: tightness, throbbing, sore Aggravating factors: no specific Relieving factors: Brace helps Pt also reports pain in legs  PRECAUTIONS: Back  - No bending, lifting >10#, twisting, reaching overhead; Dr Yetta Rivera responded with "regular back precautions" via inbasket message inquiring about precautions  RED FLAGS: None   WEIGHT BEARING RESTRICTIONS: No  FALLS: Has patient fallen in last 6 months? Yes. Number of falls 4  LIVING ENVIRONMENT: Lives with: lives with their spouse Lives in: House/apartment Stairs: Yes: External: 1 steps; planning on having rail installed Has following equipment at home: Walker - 2 wheeled, shower chair, and Grab bars  PLOF: Independent with household mobility with device, Requires assistive device for independence, and Needs assistance with ADLs  PATIENT GOALS: "walk without walker and back brace and get back to where I was before"  OBJECTIVE:   DIAGNOSTIC FINDINGS: IMPRESSION: 1. Unhealed fracture of the posterosuperior L3 body, ans associated avulsed appearance of both L3 pedicles (series 7, image 30) along the course of the bilateral L3 pedicle screws. Mild retropulsion of the posterior body fragment does not result in significant spinal stenosis. 2. No other acute osseous abnormality identified in the Lumbar Spine. But scant arthrodesis limited to only the right L5-S1 posterior elements following L3 through S1 fusion. Some endplate subsidence at each level. 3.  Aortic Atherosclerosis  (ICD10-I70.0).  Nephrolithiasis.  COGNITION: Overall cognitive status: Within functional limits for tasks assessed   SENSATION: Impaired sensation in arms and legs - numbness and tingling  COORDINATION: WFL's - slowed movement due to back pain  POSTURE: rounded shoulders and forward head  LOWER EXTREMITY ROM:   WFL's bil. LE's   LOWER EXTREMITY MMT:  grossly 4/5 throughout but pain with resistance due to back pain (surgical site)  BED MOBILITY:  Independent   TRANSFERS: Assistive device utilized: Environmental consultant - 4 wheeled  Sit to stand: Modified independence Stand to sit: Modified independence  STAIRS:  TBA  GAIT: Gait pattern:  increased knee flexion bil. LE's in stance  and step through pattern Distance walked: 49' Assistive device utilized: Environmental consultant - 2 wheeled Level of assistance: SBA Comments: pt amb. With bil. Knees flexed  FUNCTIONAL TESTS:  5 times sit to stand: 21.47 secs from chair without UE support with close CGA Timed up and go (TUG): 21.47 secs with RW 10 meter walk test: 21.56 = 1.52 ft/sec with RW Berg Balance Scale: to be assessed  - pt stood for 2" without UE support with SBA  PATIENT SURVEYS:  N/A - dx is MS  TODAY'S TREATMENT:  TherEx:  Pt performed sit to stand transfers from chair without UE support 5 times Step ups RLE and LLE 10x each leg onto 6" step with minimal UE support with CGA for bil. LE strengthening- UE support as needed to assist with balance  NeuroRe-ed: Marching 10 reps each leg - performed inside // bars- no UE support used - CGA for safety  Reviewed HEP - forward, back and side stepping 10 reps each leg each direction - bil. UE support on // bars with SBA  SLS - at counter - 10 sec hold each foot with bil. UE support (added this ex. To HEP)  Rockerboard anterior/posterior 10 reps each with UE support prn  with CGA Stepping over and back of balance beam (black) 10 reps each leg with UE support prn  Access Code: [email or web address] URL: [email or web address] Date: 10/07/2022 Prepared by: Maebelle Munroe  Exercises - Single Leg Stance with Support  - 1 x daily - 7 x weekly - 1 sets - 2-3 reps - 10 sec hold  Gait:   Pt gait trained without device 115' x 1 rep; 67' on 2nd rep-  trialed heel wedge in Lt shoe due to LLD with RLE shorter than LLE;  gait was more unsteady - heel wedge was then removed due to no benefit achieved;   115' x1 rep  with CGA to min assist without assistive device and without use of heel wedge  Step training - pt negotiated 4 steps with use of hand rails using step over step sequence with ascension and step by step sequence with descension; cues for foot placement of stance leg during descension (leg toes hang over edge of step to facilitate anterior weight shift)  Ramp training - CGA to min assist - no device used  HEP issued: 10-04-22 Access Code: [email or web address]:  added SLS exercise - Access Code: [email or web address] 10-06-22 URL: [email or web address] Date: 10/05/2022 Prepared by: Maebelle Munroe  Exercises - Alternating Step Forward with Support  - 1 x daily - 7 x weekly - 1 sets - 10 reps - Step Sideways  - 1 x daily - 7 x weekly - 1 sets - 10 reps - Alternating Step Backward with Support  - 1 x daily - 7 x weekly - 1 sets - 10 reps - Side Stepping with Counter Support  - 1 x daily - 7 x weekly - 1 sets - 2-3 reps - Standing March with Counter Support  - 1 x daily - 7 x weekly - 1 sets - 10 reps  PATIENT EDUCATION: Education details: Continue HEP; updated HEP provided to include balance exercises (see above)  Person educated: Patient Education method: Explanation and Handouts Education comprehension: verbalized understanding  HOME EXERCISE PROGRAM: Access Code: [email or web address] URL: [email or web address] Date: 09/26/2022 Prepared by: Peter Congo  Exercises - Ankle Dorsiflexion with Resistance  - 1 x daily - 7 x weekly - 3 sets - 10 reps - Gastroc Stretch on Wall  - 1 x daily - 7 x weekly - 3 sets - 10 reps - Supine Knee Extension Stretch on Towel Roll  - 1 x daily - 7 x weekly - Supine Quad Set  - 1 x daily - 7 x weekly - 3 sets - 10 reps - Sit to Stand with Arms Crossed  - 1 x daily - 7 x weekly - 3 sets - 10 reps  GOALS: Goals reviewed with patient? Yes  SHORT TERM GOALS: Target date: 10-21-22  Improve Berg score by at least 4 points  to reduce fall risk. Baseline: 30/56 (8/19) Goal status: INITIAL  2.  Pt will ambulate 230' without device with CGA on flat, even surface. Baseline:  Goal status: INITIAL  3.  Report decreased back pain to </= 3/10 intensity for increased ease & comfort with ADL's & mobility.  Baseline: 5/10 Goal status: INITIAL  4.  Improve 5x sit to stand score to </= 17 secs from chair without UE support to demo improved LE strength.  Baseline: 21.47 secs Goal status: INITIAL  5.  Improve TUG score to </= 17.5 secs with RW to demo improved functional mobility.  Baseline: 21.47 secs with RW Goal status: INITIAL  6.  Increase gait speed to >/= 1.9 ft/sec with RW with pt demonstrating bil. Knee extension in stance.  Baseline: 1.52 ft/sec with RW (21.56 secs) Goal status: INITIAL   LONG TERM GOALS: Target date: 11-18-22  Improve Berg score by at least 8 points to reduce fall risk & demo improved balance. Baseline: 30/56 (8/19) Goal status: INITIAL  2.  Pt will be modified independent with household ambulation without device. Baseline:  Goal status: INITIAL  3.  Improve 5x sit to stand score to </= 14 secs from chair without UE support to demo improved LE strength.  Baseline: 21.47 secs Goal status: INITIAL  4.  Improve TUG score to </= 14.5 secs with RW to demo improved functional mobility.  Baseline: 21.47 secs with RW Goal status: INITIAL  5.   Increase gait speed to >/= 2.3  ft/sec with RW with pt demonstrating bil. Knee extension in stance.  Baseline: 1.52 ft/sec with RW (21.56 secs) Goal status: INITIAL  6.  Independent in HEP for balance and LE strengthening and core stabilization exercises. Baseline:  Goal status: INITIAL  ASSESSMENT:  CLINICAL IMPRESSION: PT session focused on review of balance HEP, gait training without device and strengthening exercises.  Heel wedge was trialed in Rt shoe due to pt's LLD with RLE shorter than her LLE.  Heel wedge was not beneficial and increased unsteadiness with gait was noted.  Pt reported she did not feel comfortable with this wedge in her shoe, it was removed after gait training of 75'.  Pt's Rt knee is extended in stance; Lt knee is flexed due to hamstring contracture residual from Lt TKA in 2022.   Continue POC.   OBJECTIVE IMPAIRMENTS: Abnormal gait, decreased activity tolerance, decreased balance, decreased strength, and pain.   ACTIVITY LIMITATIONS: carrying, lifting, bending, squatting, stairs, transfers, reach over head, and locomotion level  PARTICIPATION LIMITATIONS: meal prep, cleaning, laundry, shopping, community activity, and pt reports she does not drive  PERSONAL FACTORS: Past/current experiences, Time since onset of injury/illness/exacerbation, and 1 comorbidity: MS and s/p back surgery  are also affecting patient's functional outcome.   REHAB POTENTIAL: Good  CLINICAL DECISION MAKING: Evolving/moderate complexity  EVALUATION COMPLEXITY: Moderate  PLAN:  PT FREQUENCY: 2x/week  PT DURATION: 8 weeks  PLANNED INTERVENTIONS: Therapeutic exercises, Therapeutic activity, Neuromuscular re-education, Balance training, Gait training, Patient/Family education, Self Care, Stair training, DME instructions, and Aquatic Therapy  PLAN FOR NEXT SESSION: check updated HEP:  begin gait training, LE strengthening, endurance  Kerry Fort, PT 10/07/2022, 3:11 PM

## 2022-10-07 ENCOUNTER — Encounter: Payer: Self-pay | Admitting: Physical Therapy

## 2022-10-11 ENCOUNTER — Ambulatory Visit: Payer: PPO | Attending: Physician Assistant | Admitting: Occupational Therapy

## 2022-10-11 ENCOUNTER — Ambulatory Visit: Payer: PPO | Admitting: Physical Therapy

## 2022-10-11 DIAGNOSIS — R269 Unspecified abnormalities of gait and mobility: Secondary | ICD-10-CM | POA: Insufficient documentation

## 2022-10-11 DIAGNOSIS — M25541 Pain in joints of right hand: Secondary | ICD-10-CM | POA: Diagnosis not present

## 2022-10-11 DIAGNOSIS — R251 Tremor, unspecified: Secondary | ICD-10-CM | POA: Insufficient documentation

## 2022-10-11 DIAGNOSIS — R2689 Other abnormalities of gait and mobility: Secondary | ICD-10-CM | POA: Diagnosis not present

## 2022-10-11 DIAGNOSIS — M25542 Pain in joints of left hand: Secondary | ICD-10-CM | POA: Diagnosis not present

## 2022-10-11 DIAGNOSIS — R2681 Unsteadiness on feet: Secondary | ICD-10-CM | POA: Insufficient documentation

## 2022-10-11 DIAGNOSIS — R278 Other lack of coordination: Secondary | ICD-10-CM | POA: Diagnosis not present

## 2022-10-11 DIAGNOSIS — R29818 Other symptoms and signs involving the nervous system: Secondary | ICD-10-CM | POA: Diagnosis not present

## 2022-10-11 DIAGNOSIS — R262 Difficulty in walking, not elsewhere classified: Secondary | ICD-10-CM | POA: Diagnosis not present

## 2022-10-11 DIAGNOSIS — M6281 Muscle weakness (generalized): Secondary | ICD-10-CM | POA: Diagnosis not present

## 2022-10-11 DIAGNOSIS — G35 Multiple sclerosis: Secondary | ICD-10-CM | POA: Diagnosis not present

## 2022-10-11 NOTE — Therapy (Signed)
OUTPATIENT OCCUPATIONAL THERAPY NEURO TREATMENT  Patient Name: Katrina Rivera MRN: [id number] DOB:Nov 28, 1945, 77 y.o., female Today's Date: 10/11/2022  PCP: Noberto Retort, MD  REFERRING PROVIDER: Charlton Amor, PA-C  END OF SESSION:  OT End of Session - 10/11/22 0757     Visit Number 2    Number of Visits 13    Date for OT Re-Evaluation 11/18/22    Authorization Type [employer] - follows Medicare guidelines    Progress Note Due on Visit 10    OT Start Time 0800    OT Stop Time 0845    OT Time Calculation (min) 45 min    Activity Tolerance Patient tolerated treatment well    Behavior During Therapy WFL for tasks assessed/performed             Past Medical History:  Diagnosis Date   Addison's disease (HCC)    takes Solu Cortef daily   Anemia    takes Ferrous Sulfate daily   Anxiety    takes Xanax nightly   Arthritis    Chronic back pain    stenosis   Depression    takes Cymbalta daily   Dizziness    if b/p drops    Fibromyalgia    History of blood transfusion    no abnormal reaction noted   History of bronchitis    many yrs ago    Hypokalemia    takes Potassium daily   Hypotension    takes Florinef daily   IBS (irritable bowel syndrome)    takes Align daily   Insomnia    takes Trazodone nightly   Joint pain    Multiple sclerosis (HCC)    doesn't take any meds   Multiple sclerosis (HCC)    New onset headache 12/22/2021   Nocturia    Osteoporosis    Palpitations    Peripheral neuropathy    Primary localized osteoarthritis of left knee 08/11/2020   Restless leg syndrome    Seasonal allergies    takes Zyrtec daily;uses Flonase daily as needed   Syncope    when missed methylprednisolone dose   Urinary frequency    takes Flomax daily   Weakness    numbness and tingling   Past Surgical History:  Procedure Laterality Date   ABDOMINAL HYSTERECTOMY  02/07/1986   APPENDECTOMY  02/07/1986   CESAREAN SECTION  1973/1977   x2    CHOLECYSTECTOMY  02/08/1995   COLECTOMY  02/08/1988   COLONOSCOPY     ESOPHAGOGASTRODUODENOSCOPY     EYE SURGERY     bilateral - /w IOL- cataracts   FRACTURE SURGERY Right    rods and screws-right leg   LUMBAR LAMINECTOMY/DECOMPRESSION MICRODISCECTOMY Left 01/24/2013   Procedure: LUMBAR FIVE TO SACRAL ONE LUMBAR LAMINECTOMY/DECOMPRESSION MICRODISCECTOMY 1 LEVEL;  Surgeon: Tia Alert, MD;  Location: [facility];  Service: Neurosurgery;  Laterality: Left;   MAXIMUM ACCESS (MAS)POSTERIOR LUMBAR INTERBODY FUSION (PLIF) 1 LEVEL N/A 06/18/2014   Procedure: MAXIMUM ACCESS SURGERY POSTERIOR LUMBAR INTERBODY FUSION LUMBAR FIVE TO SACRAL ONE ;  Surgeon: Tia Alert, MD;  Location: [facility];  Service: Neurosurgery;  Laterality: N/A;   SPINAL CORD STIMULATOR BATTERY EXCHANGE Right 07/30/2021   Procedure: Spinal cord stimulator battery replacement;  Surgeon: Tia Alert, MD;  Location: [facility];  Service: Neurosurgery;  Laterality: Right;   SPINAL CORD STIMULATOR IMPLANT     TOTAL KNEE ARTHROPLASTY Left 08/24/2020   Procedure: TOTAL KNEE ARTHROPLASTY;  Surgeon: Salvatore Marvel, MD;  Location: [facility];  Service: Orthopedics;  Laterality: Left;   Patient Active Problem List   Diagnosis Date Noted   Lumbar burst fracture (HCC) 08/23/2022   Lumbar vertebral fracture (HCC) 08/19/2022   Chronic migraine w/o aura w/o status migrainosus, not intractable 03/29/2022   Mixed hyperlipidemia 12/27/2021   Chest pain of uncertain etiology 12/27/2021   New onset headache 12/22/2021   S/P lumbar fusion 07/30/2021   Primary localized osteoarthritis of left knee 08/11/2020   Atrophic vaginitis 08/11/2020   Menopausal symptom 08/11/2020   Restless leg syndrome    Chronic back pain    Low back pain without sciatica 11/01/2018   Essential hypertension    SBO (small bowel obstruction) (HCC) 09/01/2016   Fibromyalgia    Abdominal pain, vomiting, and diarrhea    Dehydration    Somnolence, daytime 10/22/2015    Fatigue 10/22/2015   Chronic leg pain 06/29/2015   S/P lumbar spinal fusion 06/18/2014   Abnormality of gait 02/18/2013   S/P lumbar microdiscectomy 01/24/2013   Hypokalemia 12/24/2012   Sweating abnormality 10/19/2012   Dysautonomia orthostatic hypotension syndrome 08/16/2012   Hereditary and idiopathic peripheral neuropathy 08/16/2012   Chronic adrenal insufficiency (HCC) 08/16/2012   MS (multiple sclerosis) (HCC) 08/15/2012    ONSET DATE: 08/24/2022 (date of referral)  REFERRING DIAG: G35 (ICD-10-CM) - Multiple sclerosis  THERAPY DIAG:  Muscle weakness (generalized)  Other lack of coordination  Joint pain in both hands  Rationale for Evaluation and Treatment: Rehabilitation  SUBJECTIVE:   SUBJECTIVE STATEMENT: Patient reports that both hands are both sore - trigger finger on the R hand/middle finger.   Pt accompanied by: self and husband dropped her off  PERTINENT HISTORY: MS, addison's disease, anemia, anxiety, arthritis, chronic back pain, depression, fibromyalgia, hypotension, IBS, peripheral neuropathy, syncope, spinal cord stimulator, and prior back surgery. Pt underwent fusion of L2-4 and removal of segmental fixation of L3-5 to manage L3 spinal fx sustained during a fall (7/10 date of surgery).   PRECAUTIONS: Fall and Other: Pt recently had back surgery, no precautions reported or listed; Brace for back to be donned when up and moving; no lifting over 10 lbs  - contacted ortho for clarification  WEIGHT BEARING RESTRICTIONS: No  PAIN:  Are you having pain? Yes: NPRS scale: 4/10 Pain location: 4- hands; worse in other parts of body ie) 6 in back Pain description: dull; achey Aggravating factors: "overdoing it" Relieving factors: rest; scheduled medication every 6 hours   FALLS: Has patient fallen in last 6 months? Yes. Number of falls 4 including fall with lumbar fracture while reaching for pencil on the floor.   LIVING ENVIRONMENT: Lives with: lives with  their spouse Lives in: House/apartment Stairs: Yes: External: 1 steps; none Has following equipment at home: Single point cane, Walker - 2 wheeled, Environmental consultant - 4 wheeled, Wheelchair (manual), shower chair, bed side commode, and toilet riser  PLOF: Independent; has not driven recently; retired from [employer]  PATIENT GOALS: Improve B hand function, especially R hand  OBJECTIVE:   HAND DOMINANCE: Right  ADLs: Overall ADLs: mostly mod I Eating: husband cuts food for her, which is baseline LB Dressing: min A for donning shoes Bathing: husband cleans back  Tub Shower transfers: min A  IADLs: Shopping: mod A Light housekeeping: doesn't vacuum at baseline; mod A  Community mobility: SBA Handwriting: 50% legible  MOBILITY STATUS: Needs Assist: SBA with RW  ACTIVITY TOLERANCE: Activity tolerance: fair  FUNCTIONAL OUTCOME MEASURES: PSFS:  Total score = sum of  the activity scores/number of activities Minimum detectable change (90%CI) for average score = 2 points Minimum detectable change (90%CI) for single activity score = 3 points  UPPER EXTREMITY ROM:    BUE WNL; however opposition B impaired  UPPER EXTREMITY MMT:     BUE elbow ext and flex WFL  HAND FUNCTION: Grip strength: Right: 34.6 lbs; Left: 22.4 lbs  COORDINATION: 9 Hole Peg test: Right: 40 sec; Left: 38 sec  SENSATION: Light touch: Impaired  and hx of neuropathy; mainly in R index finger  EDEMA: none reported or observed  MUSCLE TONE: WNL  COGNITION: Overall cognitive status: Within functional limits for tasks assessed  VISION: Subjective report: changes in acuities Baseline vision: Wears glasses for reading only and will be getting eye exam Visual history: glaucoma and cataracts  VISION ASSESSMENT: WFL - to read clock on wall  PERCEPTION: Impaired: proprioception  PRAXIS: WFL  OBSERVATIONS: Pt appears well kept. She has RW with walker bag. Requires SBA for ambulation.    TODAY'S  TREATMENT:                                                                                                                               Self Care:  Joint protection    OT educated pt on joint protection, ergonomics, and Optometrist principles as noted in pt instructions as needed to improve UE pain and arthritic changes (especially R hand - index finger, thumb and trigger middle finger).    Handout is provided regarding joint protection and patient is encouraged to consider the specific acronym "LESS" ie) less strain on joints  L: Listen to your body E: Energy Conservation S: Stronger Joints take the Lead S: Strategize   Patient encouraged to protect hands and wrist by  - Respecting for Pain and stopping activities before they reach the point of discomfort or pain  - Rest and Work Balance ie) balancing activities with appropriate rests during activity - Reduction of Effort - Use two hands instead of one for moving items  - Use of Larger/Stronger Joints Ie) Lift or carry with the forearm or shoulder rather than fingers - Avoid Activities That Cannot Be Stopped - Use of Assistive Equipment - Consider splint use and equipment such as a jar opener to protect joints from deformity and stresses.    Patient provided information about splint options with provision of size 7 Oval 8 splint for R middle finger and images of simple wrist cock up splint provided as an option to help protect hands ie) to hold wrist neutral to avoid positions of deformity.  Demonstration provided re: managing pot with improved grasp and manipulation ie) changing hand ot avoid excessive wrist ulnar deviation, moving pot form stove to table top.   Then initiated reviewing activities that can be modified with adaptive equipment and encourage patient to look adaptive equipment for arthritis [i.e. to consider joint protection].  PATIENT EDUCATION: Education details: Counsellor  Binder idea Person educated:  Patient Education method: Explanation, Demonstration, Tactile cues, Verbal cues, and Handouts Education comprehension: verbalized understanding and needs further education  HOME EXERCISE PROGRAM: N/A this date  GOALS:  SHORT TERM GOALS: Target date: 10/18/2022    Patient will demonstrate independence with initial HEP. Baseline: Goal status: INITIAL  2.  Patient will independently verbalize at least 3 energy conservation principles in relation to ADLs to increase functional independence.  Baseline:  Goal status: IN Progress   LONG TERM GOALS: Target date: 11/01/2022    Patient will report at least two-point increase in average PSFS score or at least three-point increase in a single activity score indicating functionally significant improvement given minimum detectable change.  Baseline: 1.3 total score (See above for individual activity scores)  Goal status: INITIAL  2.  Patient will demonstrate at least 30 lbs L grip strength as needed to open jars and other containers.  Baseline: 22.4 lbs Goal status: INITIAL  3.  Patient will demonstrate improvement with nine-hole peg by at least 5 seconds bilaterally. Baseline: Right: 40 sec; Left: 38 sec Goal status: INITIAL  4.  Pt will report handwriting to be at least 75% of prior legibility/neatness. Baseline: 50% legibility Goal status: INITIAL   ASSESSMENT:  CLINICAL IMPRESSION: Patient is a 77 y.o. female who was seen today for occupational therapy treatment for MS and arthritis s/p recent prior back surgery s/p L3 spinal fx sustained during a fall (7/10 date of surgery).  Patient was appreciative of joint protection education and ideas to help protect sore hands.  Pt will benefit from ongoing skilled OT services in the outpatient setting to improve HEP ideas, provided further joint protection, energy conservation and AE recommendations to address impairments as noted at evaluation.    PERFORMANCE DEFICITS: in functional  skills including ADLs, IADLs, coordination, sensation, strength, pain, Fine motor control, mobility, endurance, decreased knowledge of precautions, decreased knowledge of use of DME, and UE functional use.  IMPAIRMENTS: are limiting patient from ADLs and IADLs.   CO-MORBIDITIES: has co-morbidities such as recent spinal surgery  that affects occupational performance. Patient will benefit from skilled OT to address above impairments and improve overall function.  REHAB POTENTIAL: Fair given chronicity of impairments   PLAN:  OT FREQUENCY: 2x/week  OT DURATION: 6 weeks  PLANNED INTERVENTIONS: self care/ADL training, therapeutic exercise, therapeutic activity, neuromuscular re-education, manual therapy, functional mobility training, electrical stimulation, fluidotherapy, moist heat, patient/family education, energy conservation, DME and/or AE instructions, and Re-evaluation  RECOMMENDED OTHER SERVICES: none at this time  CONSULTED AND AGREED WITH PLAN OF CARE: Patient  PLAN FOR NEXT SESSION:   Coordination and strengthening HEPs  Review, update and provide AE ideas for joint protection  Review memory Binder to collect HEP, resources etc  Check out paraffin for arthritis/hand pain   Victorino Sparrow, OT 10/11/2022, 12:39 PM

## 2022-10-11 NOTE — Therapy (Signed)
OUTPATIENT PHYSICAL THERAPY NEURO TREATMENT   Patient Name: [name] MRN: [id number] DOB:1945/08/27, 77 y.o., female Today's Date: 10/12/2022   PCP: Noberto Retort, MD REFERRING PROVIDER: Charlton Amor, PA-C  END OF SESSION:  PT End of Session - 10/12/22 1123     Visit Number 6    Number of Visits 17    Date for PT Re-Evaluation 11/18/22    Authorization Type [employer]    Progress Note Due on Visit 10    PT Start Time [phone]    PT Stop Time 0932    PT Time Calculation (min) 46 min    Equipment Utilized During Treatment Gait belt    Activity Tolerance Patient tolerated treatment well    Behavior During Therapy WFL for tasks assessed/performed                Past Medical History:  Diagnosis Date   Addison's disease (HCC)    takes Solu Cortef daily   Anemia    takes Ferrous Sulfate daily   Anxiety    takes Xanax nightly   Arthritis    Chronic back pain    stenosis   Depression    takes Cymbalta daily   Dizziness    if b/p drops    Fibromyalgia    History of blood transfusion    no abnormal reaction noted   History of bronchitis    many yrs ago    Hypokalemia    takes Potassium daily   Hypotension    takes Florinef daily   IBS (irritable bowel syndrome)    takes Align daily   Insomnia    takes Trazodone nightly   Joint pain    Multiple sclerosis (HCC)    doesn't take any meds   Multiple sclerosis (HCC)    New onset headache 12/22/2021   Nocturia    Osteoporosis    Palpitations    Peripheral neuropathy    Primary localized osteoarthritis of left knee 08/11/2020   Restless leg syndrome    Seasonal allergies    takes Zyrtec daily;uses Flonase daily as needed   Syncope    when missed methylprednisolone dose   Urinary frequency    takes Flomax daily   Weakness    numbness and tingling   Past Surgical History:  Procedure Laterality Date   ABDOMINAL HYSTERECTOMY  02/07/1986   APPENDECTOMY  02/07/1986   CESAREAN SECTION   1973/1977   x2   CHOLECYSTECTOMY  02/08/1995   COLECTOMY  02/08/1988   COLONOSCOPY     ESOPHAGOGASTRODUODENOSCOPY     EYE SURGERY     bilateral - /w IOL- cataracts   FRACTURE SURGERY Right    rods and screws-right leg   LUMBAR LAMINECTOMY/DECOMPRESSION MICRODISCECTOMY Left 01/24/2013   Procedure: LUMBAR FIVE TO SACRAL ONE LUMBAR LAMINECTOMY/DECOMPRESSION MICRODISCECTOMY 1 LEVEL;  Surgeon: Tia Alert, MD;  Location: [facility];  Service: Neurosurgery;  Laterality: Left;   MAXIMUM ACCESS (MAS)POSTERIOR LUMBAR INTERBODY FUSION (PLIF) 1 LEVEL N/A 06/18/2014   Procedure: MAXIMUM ACCESS SURGERY POSTERIOR LUMBAR INTERBODY FUSION LUMBAR FIVE TO SACRAL ONE ;  Surgeon: Tia Alert, MD;  Location: [facility];  Service: Neurosurgery;  Laterality: N/A;   SPINAL CORD STIMULATOR BATTERY EXCHANGE Right 07/30/2021   Procedure: Spinal cord stimulator battery replacement;  Surgeon: Tia Alert, MD;  Location: [facility] OR;  Service: Neurosurgery;  Laterality: Right;   SPINAL CORD STIMULATOR IMPLANT     TOTAL KNEE ARTHROPLASTY Left 08/24/2020   Procedure:  TOTAL KNEE ARTHROPLASTY;  Surgeon: Salvatore Marvel, MD;  Location: [facility];  Service: Orthopedics;  Laterality: Left;   Patient Active Problem List   Diagnosis Date Noted   Lumbar burst fracture (HCC) 08/23/2022   Lumbar vertebral fracture (HCC) 08/19/2022   Chronic migraine w/o aura w/o status migrainosus, not intractable 03/29/2022   Mixed hyperlipidemia 12/27/2021   Chest pain of uncertain etiology 12/27/2021   New onset headache 12/22/2021   S/P lumbar fusion 07/30/2021   Primary localized osteoarthritis of left knee 08/11/2020   Atrophic vaginitis 08/11/2020   Menopausal symptom 08/11/2020   Restless leg syndrome    Chronic back pain    Low back pain without sciatica 11/01/2018   Essential hypertension    SBO (small bowel obstruction) (HCC) 09/01/2016   Fibromyalgia    Abdominal pain, vomiting, and diarrhea    Dehydration    Somnolence,  daytime 10/22/2015   Fatigue 10/22/2015   Chronic leg pain 06/29/2015   S/P lumbar spinal fusion 06/18/2014   Abnormality of gait 02/18/2013   S/P lumbar microdiscectomy 01/24/2013   Hypokalemia 12/24/2012   Sweating abnormality 10/19/2012   Dysautonomia orthostatic hypotension syndrome 08/16/2012   Hereditary and idiopathic peripheral neuropathy 08/16/2012   Chronic adrenal insufficiency (HCC) 08/16/2012   MS (multiple sclerosis) (HCC) 08/15/2012    ONSET DATE: 08-17-22  REFERRING DIAG: G35 (ICD-10-CM) - Multiple sclerosis   Diagnosis  S32.039D (ICD-10-CM) - Closed fracture of third lumbar vertebra with routine healing, unspecified fracture morphology, subsequent encounter    THERAPY DIAG:  Unsteadiness on feet  Other abnormalities of gait and mobility  Muscle weakness (generalized)  Rationale for Evaluation and Treatment: Rehabilitation  SUBJECTIVE:                                                                                                                                                                                             SUBJECTIVE STATEMENT: Pt reports she did her exercises over the weekend but says the lifting back (hip extension in standing) hurts her back a little  Pt accompanied by: self  PERTINENT HISTORY: spinal cord stimulator implant; MS diagnosed in 1984; s/p 2 back surgeries  Per Chart note "Brief HPI:   Katrina Rivera is a 77 y.o. female with a history of relapsing remitting multiple sclerosis who was evaluated by Dr. Yetta Barre regarding gait abnormality, chronic low back pain and status post lumbar fusion May 2024. She has reportedly fallen 4 times at home after PLIF of L3-5. She was admitted 08/17/2022 for scheduled surgery of L3 spinal fracture. She underwent posterior fixation L2-L4 using NuVasive cortical pedicle screws, arthrodesis and removal of segmental  fixation L3-L5 by Dr. Yetta Barre. PT OT evaluations carried out. Incision is healed without signs  of infection. She is appropriate back soreness. She has no complaints of leg pain or new numbness, tingling or weakness per Dr. Barnett Applebaum note today. Signs are stable and she is tolerating regular diet. Other past medical history includes restless leg syndrome, anxiety, headaches with migraine features, fibromyalgia, hypotension, anemia (acute on chronic)."    PAIN:  Are you having pain? Yes: NPRS scale: 5/10 Pain location: back pain  Pain description: tightness, throbbing, sore Aggravating factors: no specific Relieving factors: Brace helps Pt also reports pain in legs  PRECAUTIONS: Back  - No bending, lifting >10#, twisting, reaching overhead; Dr Yetta Barre responded with "regular back precautions" via inbasket message inquiring about precautions  RED FLAGS: None   WEIGHT BEARING RESTRICTIONS: No  FALLS: Has patient fallen in last 6 months? Yes. Number of falls 4  LIVING ENVIRONMENT: Lives with: lives with their spouse Lives in: House/apartment Stairs: Yes: External: 1 steps; planning on having rail installed Has following equipment at home: Walker - 2 wheeled, shower chair, and Grab bars  PLOF: Independent with household mobility with device, Requires assistive device for independence, and Needs assistance with ADLs  PATIENT GOALS: "walk without walker and back brace and get back to where I was before"  OBJECTIVE:   DIAGNOSTIC FINDINGS: IMPRESSION: 1. Unhealed fracture of the posterosuperior L3 body, ans associated avulsed appearance of both L3 pedicles (series 7, image 30) along the course of the bilateral L3 pedicle screws. Mild retropulsion of the posterior body fragment does not result in significant spinal stenosis. 2. No other acute osseous abnormality identified in the Lumbar Spine. But scant arthrodesis limited to only the right L5-S1 posterior elements following L3 through S1 fusion. Some endplate subsidence at each level. 3.  Aortic Atherosclerosis (ICD10-I70.0).   Nephrolithiasis.  COGNITION: Overall cognitive status: Within functional limits for tasks assessed   SENSATION: Impaired sensation in arms and legs - numbness and tingling  COORDINATION: WFL's - slowed movement due to back pain  POSTURE: rounded shoulders and forward head  LOWER EXTREMITY ROM:   WFL's bil. LE's   LOWER EXTREMITY MMT:  grossly 4/5 throughout but pain with resistance due to back pain (surgical site)  BED MOBILITY:  Independent   TRANSFERS: Assistive device utilized: Environmental consultant - 4 wheeled  Sit to stand: Modified independence Stand to sit: Modified independence  STAIRS:  TBA  GAIT: Gait pattern:  increased knee flexion bil. LE's in stance  and step through pattern Distance walked: 16' Assistive device utilized: Environmental consultant - 2 wheeled Level of assistance: SBA Comments: pt amb. With bil. Knees flexed  FUNCTIONAL TESTS:  5 times sit to stand: 21.47 secs from chair without UE support with close CGA Timed up and go (TUG): 21.47 secs with RW 10 meter walk test: 21.56 = 1.52 ft/sec with RW Berg Balance Scale: to be assessed  - pt stood for 2" without UE support with SBA  PATIENT SURVEYS:  N/A - dx is MS  TODAY'S TREATMENT:  10-11-22  TherEx:  Pt performed sit to stand transfers from mat without UE support 5 times - no LOB with this exercise  Step ups RLE and LLE 10x each leg onto 6" step with minimal UE support with CGA for bil. LE strengthening- UE support as needed to assist with balance  Pt instructed in correct position and use of 2# ankle weight (which she currently has at home) for Lt hamstring stretching/knee flexion contracture;  towel roll placed under distal lower leg and weight placed on thigh just proximal to knee joint - instructed pt to hold position for at least 1", 2-3 times/day  Access Code: [email or web address] - added 10-11-22 URL:  [email or web address] Date: 10/12/2022 Prepared by: Maebelle Munroe  Exercises - Supine hamstring stretch  - 1 x daily - 7 x weekly - 1 sets - 2-3 reps - 60 sec hold  NeuroRe-ed:  Tap ups to 6" step alternating feet 10 reps each without UE support with CGA  Stepping over and back of orange hurdle (smaller height) 10 reps each leg with min HHA prn  Access Code: [email or web address] URL: [email or web address] Date: 10/07/2022 Prepared by: Maebelle Munroe  Exercises - Single Leg Stance with Support  - 1 x daily - 7 x weekly - 1 sets - 2-3 reps - 10 sec hold  Gait:   Pt gait trained without device 3 laps (115' x 3 reps) for 345' total distance on first rep;  230' x 1 on 2nd rep of gait training - no device;  cues to increase step length and to increase initial heel contact at stance as pt had 1 occurrence of Rt foot slightly scuffing floor - no major LOB occurred; also cues to increase arm swing     HEP issued: 10-04-22, updated 10-11-22 Access Code: [email or web address]:  added SLS exercise - Access Code: [email or web address] 10-06-22 URL: [email or web address] Date: 10/05/2022 Prepared by: Maebelle Munroe  Exercises - Alternating Step Forward with Support  - 1 x daily - 7 x weekly - 1 sets - 10 reps - Step Sideways  - 1 x daily - 7 x weekly - 1 sets - 10 reps - Alternating Step Backward with Support  - 1 x daily - 7 x weekly - 1 sets - 10 reps - Side Stepping with Counter Support  - 1 x daily - 7 x weekly - 1 sets - 2-3 reps - Standing March with Counter Support  - 1 x daily - 7 x weekly - 1 sets - 10 reps  PATIENT EDUCATION: Education details: Continue HEP; updated HEP provided to include balance exercises (see above)  Person educated: Patient Education method: Explanation and Handouts Education comprehension: verbalized understanding  HOME EXERCISE PROGRAM: Access Code: [email or web address] URL: [email or web address] Date: 09/26/2022 Prepared by: Peter Congo  Exercises - Ankle Dorsiflexion with Resistance  - 1 x daily - 7 x weekly - 3 sets - 10 reps - Gastroc Stretch on Wall  - 1 x daily - 7 x weekly - 3 sets - 10 reps - Supine Knee Extension Stretch on Towel Roll  - 1 x daily - 7 x weekly - Supine Quad Set  - 1 x daily - 7 x weekly - 3 sets - 10 reps - Sit to Stand with Arms Crossed  - 1 x daily - 7 x weekly - 3 sets - 10 reps  GOALS: Goals reviewed with patient? Yes  SHORT TERM GOALS: Target date: 10-21-22  Improve Berg score by at least 4 points to reduce fall risk. Baseline: 30/56 (8/19)  Goal status: INITIAL  2.  Pt will ambulate 230' without device with CGA on flat, even surface. Baseline:  Goal status: INITIAL  3.  Report decreased back pain to </= 3/10 intensity for increased ease & comfort with ADL's & mobility.  Baseline: 5/10 Goal status: INITIAL  4.  Improve 5x sit to stand score to </= 17 secs from chair without UE support to demo improved LE strength.  Baseline: 21.47 secs Goal status: INITIAL  5.  Improve TUG score to </= 17.5 secs with RW to demo improved functional mobility.  Baseline: 21.47 secs with RW Goal status: INITIAL  6.  Increase gait speed to >/= 1.9 ft/sec with RW with pt demonstrating bil. Knee extension in stance.  Baseline: 1.52 ft/sec with RW (21.56 secs) Goal status: INITIAL   LONG TERM GOALS: Target date: 11-18-22  Improve Berg score by at least 8 points to reduce fall risk & demo improved balance. Baseline: 30/56 (8/19) Goal status: INITIAL  2.  Pt will be modified independent with household ambulation without device. Baseline:  Goal status: INITIAL  3.  Improve 5x sit to stand score to </= 14 secs from chair without UE support to demo improved LE strength.  Baseline: 21.47 secs Goal status: INITIAL  4.  Improve TUG score to </= 14.5 secs with RW to demo improved functional mobility.  Baseline: 21.47 secs with RW Goal status: INITIAL  5.   Increase gait speed to >/= 2.3  ft/sec with RW with pt demonstrating bil. Knee extension in stance.  Baseline: 1.52 ft/sec with RW (21.56 secs) Goal status: INITIAL  6.  Independent in HEP for balance and LE strengthening and core stabilization exercises. Baseline:  Goal status: INITIAL  ASSESSMENT:  CLINICAL IMPRESSION: PT session focused on gait training without device with pt ambulating 345' on 1st rep and 230' on 2nd rep; pt had 1 occurrence of Rt toes scuffing floor due to decreased foot clearance in swing phase of gait.  Lt knee flexion contracture contributes to gait deviations.  Pt able to increase arm swing with verbal cues.  Pt is progressing well towards STG's.  Continue POC.   OBJECTIVE IMPAIRMENTS: Abnormal gait, decreased activity tolerance, decreased balance, decreased strength, and pain.   ACTIVITY LIMITATIONS: carrying, lifting, bending, squatting, stairs, transfers, reach over head, and locomotion level  PARTICIPATION LIMITATIONS: meal prep, cleaning, laundry, shopping, community activity, and pt reports she does not drive  PERSONAL FACTORS: Past/current experiences, Time since onset of injury/illness/exacerbation, and 1 comorbidity: MS and s/p back surgery  are also affecting patient's functional outcome.   REHAB POTENTIAL: Good  CLINICAL DECISION MAKING: Evolving/moderate complexity  EVALUATION COMPLEXITY: Moderate  PLAN:  PT FREQUENCY: 2x/week  PT DURATION: 8 weeks  PLANNED INTERVENTIONS: Therapeutic exercises, Therapeutic activity, Neuromuscular re-education, Balance training, Gait training, Patient/Family education, Self Care, Stair training, DME instructions, and Aquatic Therapy  PLAN FOR NEXT SESSION: Cont gait training, LE strengthening, endurance  Kerry Fort, PT 10/12/2022, 11:27 AM

## 2022-10-12 ENCOUNTER — Encounter: Payer: Self-pay | Admitting: Physical Therapy

## 2022-10-12 ENCOUNTER — Ambulatory Visit: Payer: PPO | Admitting: Occupational Therapy

## 2022-10-12 DIAGNOSIS — M6281 Muscle weakness (generalized): Secondary | ICD-10-CM | POA: Diagnosis not present

## 2022-10-12 DIAGNOSIS — G35 Multiple sclerosis: Secondary | ICD-10-CM

## 2022-10-12 DIAGNOSIS — R278 Other lack of coordination: Secondary | ICD-10-CM

## 2022-10-12 DIAGNOSIS — M25541 Pain in joints of right hand: Secondary | ICD-10-CM

## 2022-10-12 NOTE — Therapy (Signed)
OUTPATIENT OCCUPATIONAL THERAPY NEURO TREATMENT  Patient Name: Katrina Rivera MRN: [id number] DOB:10-14-1945, 77 y.o., female Today's Date: 10/12/2022  PCP: Noberto Retort, MD  REFERRING PROVIDER: Charlton Amor, PA-C  END OF SESSION:  OT End of Session - 10/12/22 0933     Visit Number 3    Number of Visits 13    Date for OT Re-Evaluation 11/18/22    Authorization Type [employer] - follows Medicare guidelines    Progress Note Due on Visit 10    OT Start Time 0934    OT Stop Time 1014    OT Time Calculation (min) 40 min    Activity Tolerance Patient tolerated treatment well    Behavior During Therapy WFL for tasks assessed/performed             Past Medical History:  Diagnosis Date   Addison's disease (HCC)    takes Solu Cortef daily   Anemia    takes Ferrous Sulfate daily   Anxiety    takes Xanax nightly   Arthritis    Chronic back pain    stenosis   Depression    takes Cymbalta daily   Dizziness    if b/p drops    Fibromyalgia    History of blood transfusion    no abnormal reaction noted   History of bronchitis    many yrs ago    Hypokalemia    takes Potassium daily   Hypotension    takes Florinef daily   IBS (irritable bowel syndrome)    takes Align daily   Insomnia    takes Trazodone nightly   Joint pain    Multiple sclerosis (HCC)    doesn't take any meds   Multiple sclerosis (HCC)    New onset headache 12/22/2021   Nocturia    Osteoporosis    Palpitations    Peripheral neuropathy    Primary localized osteoarthritis of left knee 08/11/2020   Restless leg syndrome    Seasonal allergies    takes Zyrtec daily;uses Flonase daily as needed   Syncope    when missed methylprednisolone dose   Urinary frequency    takes Flomax daily   Weakness    numbness and tingling   Past Surgical History:  Procedure Laterality Date   ABDOMINAL HYSTERECTOMY  02/07/1986   APPENDECTOMY  02/07/1986   CESAREAN SECTION  1973/1977   x2    CHOLECYSTECTOMY  02/08/1995   COLECTOMY  02/08/1988   COLONOSCOPY     ESOPHAGOGASTRODUODENOSCOPY     EYE SURGERY     bilateral - /w IOL- cataracts   FRACTURE SURGERY Right    rods and screws-right leg   LUMBAR LAMINECTOMY/DECOMPRESSION MICRODISCECTOMY Left 01/24/2013   Procedure: LUMBAR FIVE TO SACRAL ONE LUMBAR LAMINECTOMY/DECOMPRESSION MICRODISCECTOMY 1 LEVEL;  Surgeon: Tia Alert, MD;  Location: [facility];  Service: Neurosurgery;  Laterality: Left;   MAXIMUM ACCESS (MAS)POSTERIOR LUMBAR INTERBODY FUSION (PLIF) 1 LEVEL N/A 06/18/2014   Procedure: MAXIMUM ACCESS SURGERY POSTERIOR LUMBAR INTERBODY FUSION LUMBAR FIVE TO SACRAL ONE ;  Surgeon: Tia Alert, MD;  Location: [facility];  Service: Neurosurgery;  Laterality: N/A;   SPINAL CORD STIMULATOR BATTERY EXCHANGE Right 07/30/2021   Procedure: Spinal cord stimulator battery replacement;  Surgeon: Tia Alert, MD;  Location[facility];  Service: Neurosurgery;  Laterality: Right;   SPINAL CORD STIMULATOR IMPLANT     TOTAL KNEE ARTHROPLASTY Left 08/24/2020   Procedure: TOTAL KNEE ARTHROPLASTY;  Surgeon: Salvatore Marvel, MD;  Location: [facility];  Service: Orthopedics;  Laterality: Left;   Patient Active Problem List   Diagnosis Date Noted   Lumbar burst fracture (HCC) 08/23/2022   Lumbar vertebral fracture (HCC) 08/19/2022   Chronic migraine w/o aura w/o status migrainosus, not intractable 03/29/2022   Mixed hyperlipidemia 12/27/2021   Chest pain of uncertain etiology 12/27/2021   New onset headache 12/22/2021   S/P lumbar fusion 07/30/2021   Primary localized osteoarthritis of left knee 08/11/2020   Atrophic vaginitis 08/11/2020   Menopausal symptom 08/11/2020   Restless leg syndrome    Chronic back pain    Low back pain without sciatica 11/01/2018   Essential hypertension    SBO (small bowel obstruction) (HCC) 09/01/2016   Fibromyalgia    Abdominal pain, vomiting, and diarrhea    Dehydration    Somnolence, daytime 10/22/2015    Fatigue 10/22/2015   Chronic leg pain 06/29/2015   S/P lumbar spinal fusion 06/18/2014   Abnormality of gait 02/18/2013   S/P lumbar microdiscectomy 01/24/2013   Hypokalemia 12/24/2012   Sweating abnormality 10/19/2012   Dysautonomia orthostatic hypotension syndrome 08/16/2012   Hereditary and idiopathic peripheral neuropathy 08/16/2012   Chronic adrenal insufficiency (HCC) 08/16/2012   MS (multiple sclerosis) (HCC) 08/15/2012    ONSET DATE: 08/24/2022 (date of referral)  REFERRING DIAG: G35 (ICD-10-CM) - Multiple sclerosis  THERAPY DIAG:  Muscle weakness (generalized)  Other lack of coordination  Joint pain in both hands  MS (multiple sclerosis) (HCC)  Rationale for Evaluation and Treatment: Rehabilitation  SUBJECTIVE:   SUBJECTIVE STATEMENT: Patient reports that she got a tray for her walker. She has an oval 8 for her R middle finger.   Pt accompanied by: self and husband dropped her off  PERTINENT HISTORY: MS, addison's disease, anemia, anxiety, arthritis, chronic back pain, depression, fibromyalgia, hypotension, IBS, peripheral neuropathy, syncope, spinal cord stimulator, and prior back surgery. Pt underwent fusion of L2-4 and removal of segmental fixation of L3-5 to manage L3 spinal fx sustained during a fall (7/10 date of surgery).   PRECAUTIONS: Fall and Other: Pt recently had back surgery, no precautions reported or listed; Brace for back to be donned when up and moving; no lifting over 10 lbs  - contacted ortho for clarification  WEIGHT BEARING RESTRICTIONS: No  PAIN:  Are you having pain? Yes: NPRS scale: 5/10 Pain location: 4- hands; worse in other parts of body ie) 6 in back Pain description: dull; achey Aggravating factors: "overdoing it" Relieving factors: rest; scheduled medication every 6 hours   FALLS: Has patient fallen in last 6 months? Yes. Number of falls 4 including fall with lumbar fracture while reaching for pencil on the floor.   LIVING  ENVIRONMENT: Lives with: lives with their spouse Lives in: House/apartment Stairs: Yes: External: 1 steps; none Has following equipment at home: Single point cane, Walker - 2 wheeled, Environmental consultant - 4 wheeled, Wheelchair (manual), shower chair, bed side commode, and toilet riser  PLOF: Independent; has not driven recently; retired from [employer]  PATIENT GOALS: Improve B hand function, especially R hand  OBJECTIVE:   HAND DOMINANCE: Right  ADLs: Overall ADLs: mostly mod I Eating: husband cuts food for her, which is baseline LB Dressing: min A for donning shoes Bathing: husband cleans back  Tub Shower transfers: min A  IADLs: Shopping: mod A Light housekeeping: doesn't vacuum at baseline; mod A  Community mobility: SBA Handwriting: 50% legible  MOBILITY STATUS: Needs Assist: SBA with RW  ACTIVITY TOLERANCE: Activity tolerance: fair  FUNCTIONAL  OUTCOME MEASURES: PSFS:  Total score = sum of the activity scores/number of activities Minimum detectable change (90%CI) for average score = 2 points Minimum detectable change (90%CI) for single activity score = 3 points  UPPER EXTREMITY ROM:    BUE WNL; however opposition B impaired  UPPER EXTREMITY MMT:     BUE elbow ext and flex WFL  HAND FUNCTION: Grip strength: Right: 34.6 lbs; Left: 22.4 lbs  COORDINATION: 9 Hole Peg test: Right: 40 sec; Left: 38 sec  SENSATION: Light touch: Impaired  and hx of neuropathy; mainly in R index finger  EDEMA: none reported or observed  MUSCLE TONE: WNL  COGNITION: Overall cognitive status: Within functional limits for tasks assessed  VISION: Subjective report: changes in acuities Baseline vision: Wears glasses for reading only and will be getting eye exam Visual history: glaucoma and cataracts  VISION ASSESSMENT: WFL - to read clock on wall  PERCEPTION: Impaired: proprioception  PRAXIS: WFL  OBSERVATIONS: Pt appears well kept. She has RW with walker bag. Requires SBA  for ambulation.    TODAY'S TREATMENT:                                                                                                                               OT initiated bilateral coordination HOME EXERCISE PROGRAM as noted in patient instructions including pick up and placement of items, shuffling and turning cards, item stacking and unstacking, rolling a piece of tissue paper into a ball, rolling golf balls in hand, rolling a pen in between fingers and thumb, picking up and storing items in hand, and then translating stored items to tips of thumb and index finger before placing them individually into a container. Patient able to return demonstration of all exercises. Handout provided.  OT initiated bilateral yellow theraputty exercises (search, grip, and pinch) as noted in patient instructions for coordination and strength   PATIENT EDUCATION: Education details: Coordination and yellow putty HEPs Person educated: Patient Education method: Explanation, Demonstration, Tactile cues, Verbal cues, and Handouts Education comprehension: verbalized understanding and needs further education  HOME EXERCISE PROGRAM: 10/12/2022: Coordination and yellow putty HEPs  GOALS:  SHORT TERM GOALS: Target date: 10/18/2022    Patient will demonstrate independence with initial HEP. Baseline: Goal status: INITIAL  2.  Patient will independently verbalize at least 3 energy conservation principles in relation to ADLs to increase functional independence.  Baseline:  Goal status: IN Progress   LONG TERM GOALS: Target date: 11/01/2022    Patient will report at least two-point increase in average PSFS score or at least three-point increase in a single activity score indicating functionally significant improvement given minimum detectable change.  Baseline: 1.3 total score (See above for individual activity scores)  Goal status: INITIAL  2.  Patient will demonstrate at least 30 lbs L grip strength as  needed to open jars and other containers.  Baseline: 22.4 lbs Goal status: INITIAL  3.  Patient will demonstrate improvement  with nine-hole peg by at least 5 seconds bilaterally. Baseline: Right: 40 sec; Left: 38 sec Goal status: INITIAL  4.  Pt will report handwriting to be at least 75% of prior legibility/neatness. Baseline: 50% legibility Goal status: INITIAL   ASSESSMENT:  CLINICAL IMPRESSION: Patient demonstrates good understanding of HEP as needed to progress towards goals.   PERFORMANCE DEFICITS: in functional skills including ADLs, IADLs, coordination, sensation, strength, pain, Fine motor control, mobility, endurance, decreased knowledge of precautions, decreased knowledge of use of DME, and UE functional use.  IMPAIRMENTS: are limiting patient from ADLs and IADLs.   CO-MORBIDITIES: has co-morbidities such as recent spinal surgery  that affects occupational performance. Patient will benefit from skilled OT to address above impairments and improve overall function.  REHAB POTENTIAL: Fair given chronicity of impairments   PLAN:  OT FREQUENCY: 2x/week  OT DURATION: 6 weeks  PLANNED INTERVENTIONS: self care/ADL training, therapeutic exercise, therapeutic activity, neuromuscular re-education, manual therapy, functional mobility training, electrical stimulation, fluidotherapy, moist heat, patient/family education, energy conservation, DME and/or AE instructions, and Re-evaluation  RECOMMENDED OTHER SERVICES: none at this time  CONSULTED AND AGREED WITH PLAN OF CARE: Patient  PLAN FOR NEXT SESSION:   Review Coordination and putty HEPs  Review, update and provide AE ideas for joint protection  Review memory Binder to collect HEP, resources etc  Check out paraffin for arthritis/hand pain   Delana Meyer, OT 10/12/2022, 4:48 PM

## 2022-10-14 ENCOUNTER — Ambulatory Visit: Payer: PPO | Admitting: Physical Therapy

## 2022-10-14 DIAGNOSIS — R2689 Other abnormalities of gait and mobility: Secondary | ICD-10-CM

## 2022-10-14 DIAGNOSIS — R278 Other lack of coordination: Secondary | ICD-10-CM

## 2022-10-14 DIAGNOSIS — R262 Difficulty in walking, not elsewhere classified: Secondary | ICD-10-CM

## 2022-10-14 DIAGNOSIS — R2681 Unsteadiness on feet: Secondary | ICD-10-CM

## 2022-10-14 DIAGNOSIS — M6281 Muscle weakness (generalized): Secondary | ICD-10-CM

## 2022-10-14 DIAGNOSIS — G35 Multiple sclerosis: Secondary | ICD-10-CM

## 2022-10-14 DIAGNOSIS — R269 Unspecified abnormalities of gait and mobility: Secondary | ICD-10-CM

## 2022-10-14 NOTE — Therapy (Signed)
OUTPATIENT PHYSICAL THERAPY NEURO TREATMENT   Patient Name: Katrina Rivera MRN: [id number] DOB:[birth year], 77 y.o., female Today's Date: 10/14/2022   PCP: Noberto Retort, MD REFERRING PROVIDER: Charlton Amor, PA-C  END OF SESSION:  PT End of Session - 10/14/22 1234     Visit Number 7    Number of Visits 17    Date for PT Re-Evaluation 11/18/22    Authorization Type [employer]    Progress Note Due on Visit 10    PT Start Time 1230    PT Stop Time 1315    PT Time Calculation (min) 45 min    Equipment Utilized During Treatment Gait belt    Activity Tolerance Patient tolerated treatment well    Behavior During Therapy WFL for tasks assessed/performed                 Past Medical History:  Diagnosis Date   Addison's disease (HCC)    takes Solu Cortef daily   Anemia    takes Ferrous Sulfate daily   Anxiety    takes Xanax nightly   Arthritis    Chronic back pain    stenosis   Depression    takes Cymbalta daily   Dizziness    if b/p drops    Fibromyalgia    History of blood transfusion    no abnormal reaction noted   History of bronchitis    many yrs ago    Hypokalemia    takes Potassium daily   Hypotension    takes Florinef daily   IBS (irritable bowel syndrome)    takes Align daily   Insomnia    takes Trazodone nightly   Joint pain    Multiple sclerosis (HCC)    doesn't take any meds   Multiple sclerosis (HCC)    New onset headache 12/22/2021   Nocturia    Osteoporosis    Palpitations    Peripheral neuropathy    Primary localized osteoarthritis of left knee 08/11/2020   Restless leg syndrome    Seasonal allergies    takes Zyrtec daily;uses Flonase daily as needed   Syncope    when missed methylprednisolone dose   Urinary frequency    takes Flomax daily   Weakness    numbness and tingling   Past Surgical History:  Procedure Laterality Date   ABDOMINAL HYSTERECTOMY  02/07/1986   APPENDECTOMY  02/07/1986   CESAREAN SECTION   1973/1977   x2   CHOLECYSTECTOMY  02/08/1995   COLECTOMY  02/08/1988   COLONOSCOPY     ESOPHAGOGASTRODUODENOSCOPY     EYE SURGERY     bilateral - /w IOL- cataracts   FRACTURE SURGERY Right    rods and screws-right leg   LUMBAR LAMINECTOMY/DECOMPRESSION MICRODISCECTOMY Left 01/24/2013   Procedure: LUMBAR FIVE TO SACRAL ONE LUMBAR LAMINECTOMY/DECOMPRESSION MICRODISCECTOMY 1 LEVEL;  Surgeon: Tia Alert, MD;  Location: [facility];  Service: Neurosurgery;  Laterality: Left;   MAXIMUM ACCESS (MAS)POSTERIOR LUMBAR INTERBODY FUSION (PLIF) 1 LEVEL N/A 06/18/2014   Procedure: MAXIMUM ACCESS SURGERY POSTERIOR LUMBAR INTERBODY FUSION LUMBAR FIVE TO SACRAL ONE ;  Surgeon: Tia Alert, MD;  Location: [facility];  Service: Neurosurgery;  Laterality: N/A;   SPINAL CORD STIMULATOR BATTERY EXCHANGE Right 07/30/2021   Procedure: Spinal cord stimulator battery replacement;  Surgeon: Tia Alert, MD;  Location: [facility] OR;  Service: Neurosurgery;  Laterality: Right;   SPINAL CORD STIMULATOR IMPLANT     TOTAL KNEE ARTHROPLASTY Left 08/24/2020  Procedure: TOTAL KNEE ARTHROPLASTY;  Surgeon: Salvatore Marvel, MD;  Location: [facility];  Service: Orthopedics;  Laterality: Left;   Patient Active Problem List   Diagnosis Date Noted   Lumbar burst fracture (HCC) 08/23/2022   Lumbar vertebral fracture (HCC) 08/19/2022   Chronic migraine w/o aura w/o status migrainosus, not intractable 03/29/2022   Mixed hyperlipidemia 12/27/2021   Chest pain of uncertain etiology 12/27/2021   New onset headache 12/22/2021   S/P lumbar fusion 07/30/2021   Primary localized osteoarthritis of left knee 08/11/2020   Atrophic vaginitis 08/11/2020   Menopausal symptom 08/11/2020   Restless leg syndrome    Chronic back pain    Low back pain without sciatica 11/01/2018   Essential hypertension    SBO (small bowel obstruction) (HCC) 09/01/2016   Fibromyalgia    Abdominal pain, vomiting, and diarrhea    Dehydration    Somnolence,  daytime 10/22/2015   Fatigue 10/22/2015   Chronic leg pain 06/29/2015   S/P lumbar spinal fusion 06/18/2014   Abnormality of gait 02/18/2013   S/P lumbar microdiscectomy 01/24/2013   Hypokalemia 12/24/2012   Sweating abnormality 10/19/2012   Dysautonomia orthostatic hypotension syndrome 08/16/2012   Hereditary and idiopathic peripheral neuropathy 08/16/2012   Chronic adrenal insufficiency (HCC) 08/16/2012   MS (multiple sclerosis) (HCC) 08/15/2012    ONSET DATE: 08-17-22  REFERRING DIAG: G35 (ICD-10-CM) - Multiple sclerosis   Diagnosis  S32.039D (ICD-10-CM) - Closed fracture of third lumbar vertebra with routine healing, unspecified fracture morphology, subsequent encounter    THERAPY DIAG:  Muscle weakness (generalized)  Other lack of coordination  MS (multiple sclerosis) (HCC)  Unsteadiness on feet  Other abnormalities of gait and mobility  Difficulty in walking, not elsewhere classified  Abnormality of gait  Rationale for Evaluation and Treatment: Rehabilitation  SUBJECTIVE:                                                                                                                                                                                             SUBJECTIVE STATEMENT: Pt reports her back pain is ongoing, was so bad over the past few days she almost called Dr. Yetta Barre.  Pt accompanied by: self  PERTINENT HISTORY: spinal cord stimulator implant; MS diagnosed in 1984; s/p 2 back surgeries  Per Chart note "Brief HPI:   Katrina Rivera is a 77 y.o. female with a history of relapsing remitting multiple sclerosis who was evaluated by Dr. Yetta Barre regarding gait abnormality, chronic low back pain and status post lumbar fusion May 2024. She has reportedly fallen 4 times at home after PLIF of L3-5. She was admitted 08/17/2022 for scheduled surgery  of L3 spinal fracture. She underwent posterior fixation L2-L4 using NuVasive cortical pedicle screws, arthrodesis and  removal of segmental fixation L3-L5 by Dr. Yetta Barre. PT OT evaluations carried out. Incision is healed without signs of infection. She is appropriate back soreness. She has no complaints of leg pain or new numbness, tingling or weakness per Dr. Barnett Applebaum note today. Signs are stable and she is tolerating regular diet. Other past medical history includes restless leg syndrome, anxiety, headaches with migraine features, fibromyalgia, hypotension, anemia (acute on chronic)."    PAIN:  Are you having pain? Yes: NPRS scale: 5/10 Pain location: back pain  Pain description: tightness, throbbing, sore Aggravating factors: no specific Relieving factors: Brace helps Pt also reports pain in legs  PRECAUTIONS: Back  - No bending, lifting >10#, twisting, reaching overhead; Dr Yetta Barre responded with "regular back precautions" via inbasket message inquiring about precautions  RED FLAGS: None   WEIGHT BEARING RESTRICTIONS: No  FALLS: Has patient fallen in last 6 months? Yes. Number of falls 4  LIVING ENVIRONMENT: Lives with: lives with their spouse Lives in: House/apartment Stairs: Yes: External: 1 steps; planning on having rail installed Has following equipment at home: Walker - 2 wheeled, shower chair, and Grab bars  PLOF: Independent with household mobility with device, Requires assistive device for independence, and Needs assistance with ADLs  PATIENT GOALS: "walk without walker and back brace and get back to where I was before"  OBJECTIVE:   DIAGNOSTIC FINDINGS: IMPRESSION: 1. Unhealed fracture of the posterosuperior L3 body, ans associated avulsed appearance of both L3 pedicles (series 7, image 30) along the course of the bilateral L3 pedicle screws. Mild retropulsion of the posterior body fragment does not result in significant spinal stenosis. 2. No other acute osseous abnormality identified in the Lumbar Spine. But scant arthrodesis limited to only the right L5-S1 posterior elements  following L3 through S1 fusion. Some endplate subsidence at each level. 3.  Aortic Atherosclerosis (ICD10-I70.0).  Nephrolithiasis.  COGNITION: Overall cognitive status: Within functional limits for tasks assessed   SENSATION: Impaired sensation in arms and legs - numbness and tingling  COORDINATION: WFL's - slowed movement due to back pain  POSTURE: rounded shoulders and forward head  LOWER EXTREMITY ROM:   WFL's bil. LE's   LOWER EXTREMITY MMT:  grossly 4/5 throughout but pain with resistance due to back pain (surgical site)  BED MOBILITY:  Independent   TRANSFERS: Assistive device utilized: Environmental consultant - 4 wheeled  Sit to stand: Modified independence Stand to sit: Modified independence  STAIRS:  TBA  GAIT: Gait pattern:  increased knee flexion bil. LE's in stance  and step through pattern Distance walked: 35' Assistive device utilized: Environmental consultant - 2 wheeled Level of assistance: SBA Comments: pt amb. With bil. Knees flexed  FUNCTIONAL TESTS:  5 times sit to stand: 21.47 secs from chair without UE support with close CGA Timed up and go (TUG): 21.47 secs with RW 10 meter walk test: 21.56 = 1.52 ft/sec with RW Berg Balance Scale: to be assessed  - pt stood for 2" without UE support with SBA  PATIENT SURVEYS:  N/A - dx is MS  TODAY'S TREATMENT:  10-11-22  Neuro Re-ed: At ballet bar to work on increasing LE step length and step clearance: LLE foam beam step-overs 2 x 10 reps with no UE support RLE foam beam step-overs 2 x 10 reps with no UE support Alt L/R forwards step-overs 2 x 10 reps B with LUE support  Sit to stand onto airex 2 x 10 reps with focus on eccentric control with sitting   Gait:   Gait pattern: decreased step length- Right, decreased step length- Left, and knee flexed in stance- Left Distance walked: 300 ft Assistive device utilized:  None Level of assistance: CGA Comments: gait with no AD    PATIENT EDUCATION: Education details: Continue HEP Person educated: Patient Education method: Explanation Education comprehension: verbalized understanding  HOME EXERCISE PROGRAM: Access Code: [email or web address] URL: [email or web address] Date: 09/26/2022 Prepared by: Peter Congo  Exercises - Ankle Dorsiflexion with Resistance  - 1 x daily - 7 x weekly - 3 sets - 10 reps - Gastroc Stretch on Wall  - 1 x daily - 7 x weekly - 3 sets - 10 reps - Supine Knee Extension Stretch on Towel Roll  - 1 x daily - 7 x weekly - Supine Quad Set  - 1 x daily - 7 x weekly - 3 sets - 10 reps - Sit to Stand with Arms Crossed  - 1 x daily - 7 x weekly - 3 sets - 10 reps   Access Code: [email or web address] - added 10-11-22 URL: [email or web address] Date: 10/12/2022 Prepared by: Maebelle Munroe  Exercises - Supine hamstring stretch  - 1 x daily - 7 x weekly - 1 sets - 2-3 reps - 60 sec hold  Access Code: [email or web address] URL: [email or web address] Date: 10/07/2022 Prepared by: Maebelle Munroe  Exercises - Single Leg Stance with Support  - 1 x daily - 7 x weekly - 1 sets - 2-3 reps - 10 sec hold   HEP issued: 10-04-22, updated 10-11-22 Access Code: [email or web address]:  added SLS exercise - Access Code: [email or web address] 10-06-22 URL: [email or web address] Date: 10/05/2022 Prepared by: Maebelle Munroe  Exercises - Alternating Step Forward with Support  - 1 x daily - 7 x weekly - 1 sets - 10 reps - Step Sideways  - 1 x daily - 7 x weekly - 1 sets - 10 reps - Alternating Step Backward with Support  - 1 x daily - 7 x weekly - 1 sets - 10 reps - Side Stepping with Counter Support  - 1 x daily - 7 x weekly - 1 sets - 2-3 reps - Standing March with Counter Support  - 1 x daily - 7 x weekly - 1 sets - 10 reps  GOALS: Goals reviewed with patient? Yes  SHORT TERM GOALS: Target date: 10-21-22  Improve Berg score by at least 4 points to  reduce fall risk. Baseline: 30/56 (8/19) Goal status: INITIAL  2.  Pt will ambulate 230' without device with CGA on flat, even surface. Baseline:  Goal status: INITIAL  3.  Report decreased back pain to </= 3/10 intensity for increased ease & comfort with ADL's & mobility.  Baseline: 5/10 Goal status: INITIAL  4.  Improve 5x sit to stand score to </= 17 secs from chair without UE support to demo improved LE strength.  Baseline: 21.47 secs Goal status: INITIAL  5.  Improve TUG score to </= 17.5 secs with RW to demo improved functional mobility.  Baseline: 21.47 secs with RW Goal status: INITIAL  6.  Increase gait speed to >/= 1.9 ft/sec with RW  with pt demonstrating bil. Knee extension in stance.  Baseline: 1.52 ft/sec with RW (21.56 secs) Goal status: INITIAL   LONG TERM GOALS: Target date: 11-18-22  Improve Berg score by at least 8 points to reduce fall risk & demo improved balance. Baseline: 30/56 (8/19) Goal status: INITIAL  2.  Pt will be modified independent with household ambulation without device. Baseline:  Goal status: INITIAL  3.  Improve 5x sit to stand score to </= 14 secs from chair without UE support to demo improved LE strength.  Baseline: 21.47 secs Goal status: INITIAL  4.  Improve TUG score to </= 14.5 secs with RW to demo improved functional mobility.  Baseline: 21.47 secs with RW Goal status: INITIAL  5.   Increase gait speed to >/= 2.3 ft/sec with RW with pt demonstrating bil. Knee extension in stance.  Baseline: 1.52 ft/sec with RW (21.56 secs) Goal status: INITIAL  6.  Independent in HEP for balance and LE strengthening and core stabilization exercises. Baseline:  Goal status: INITIAL  ASSESSMENT:  CLINICAL IMPRESSION: Emphasis of skilled PT session on continuing to work on decreased AD reliance with functional mobility, improving gait mechanics, and functional LE strengthening. Pt exhibits improved balance without use of AD this session  but does struggle to perform LE obstacle step-overs with decreased UE support. Pt continues to benefit from skilled therapy services to work towards increasing her safety and independence with functional mobility. Continue POC.    OBJECTIVE IMPAIRMENTS: Abnormal gait, decreased activity tolerance, decreased balance, decreased strength, and pain.   ACTIVITY LIMITATIONS: carrying, lifting, bending, squatting, stairs, transfers, reach over head, and locomotion level  PARTICIPATION LIMITATIONS: meal prep, cleaning, laundry, shopping, community activity, and pt reports she does not drive  PERSONAL FACTORS: Past/current experiences, Time since onset of injury/illness/exacerbation, and 1 comorbidity: MS and s/p back surgery  are also affecting patient's functional outcome.   REHAB POTENTIAL: Good  CLINICAL DECISION MAKING: Evolving/moderate complexity  EVALUATION COMPLEXITY: Moderate  PLAN:  PT FREQUENCY: 2x/week  PT DURATION: 8 weeks  PLANNED INTERVENTIONS: Therapeutic exercises, Therapeutic activity, Neuromuscular re-education, Balance training, Gait training, Patient/Family education, Self Care, Stair training, DME instructions, and Aquatic Therapy  PLAN FOR NEXT SESSION: Cont gait training, LE strengthening, endurance, try out theracane, balance on compliant surfaces, bridges? TA contract?  Peter Congo, PT, DPT, CSRS  10/14/2022, 1:17 PM

## 2022-10-17 ENCOUNTER — Ambulatory Visit: Payer: PPO | Admitting: Occupational Therapy

## 2022-10-17 ENCOUNTER — Ambulatory Visit: Payer: PPO | Admitting: Physical Therapy

## 2022-10-17 DIAGNOSIS — R2681 Unsteadiness on feet: Secondary | ICD-10-CM

## 2022-10-17 DIAGNOSIS — R262 Difficulty in walking, not elsewhere classified: Secondary | ICD-10-CM

## 2022-10-17 DIAGNOSIS — R251 Tremor, unspecified: Secondary | ICD-10-CM

## 2022-10-17 DIAGNOSIS — G35 Multiple sclerosis: Secondary | ICD-10-CM

## 2022-10-17 DIAGNOSIS — R2689 Other abnormalities of gait and mobility: Secondary | ICD-10-CM

## 2022-10-17 DIAGNOSIS — M6281 Muscle weakness (generalized): Secondary | ICD-10-CM

## 2022-10-17 DIAGNOSIS — R278 Other lack of coordination: Secondary | ICD-10-CM

## 2022-10-17 DIAGNOSIS — R269 Unspecified abnormalities of gait and mobility: Secondary | ICD-10-CM

## 2022-10-17 NOTE — Therapy (Signed)
OUTPATIENT OCCUPATIONAL THERAPY NEURO TREATMENT  Patient Name: Katrina Rivera MRN: [id number] DOB:1945-12-10, 77 y.o., female Today's Date: 10/17/2022  PCP: Noberto Retort, MD  REFERRING PROVIDER: Charlton Amor, PA-C  END OF SESSION:  OT End of Session - 10/17/22 1226     Visit Number 4    Number of Visits 13    Date for OT Re-Evaluation 11/18/22    Authorization Type [employer] - follows Medicare guidelines    Progress Note Due on Visit 10    OT Start Time 1232    OT Stop Time 1315    OT Time Calculation (min) 43 min    Equipment Utilized During Treatment FM tasks (pill box)    Activity Tolerance Patient tolerated treatment well    Behavior During Therapy WFL for tasks assessed/performed             Past Medical History:  Diagnosis Date   Addison's disease (HCC)    takes Solu Cortef daily   Anemia    takes Ferrous Sulfate daily   Anxiety    takes Xanax nightly   Arthritis    Chronic back pain    stenosis   Depression    takes Cymbalta daily   Dizziness    if b/p drops    Fibromyalgia    History of blood transfusion    no abnormal reaction noted   History of bronchitis    many yrs ago    Hypokalemia    takes Potassium daily   Hypotension    takes Florinef daily   IBS (irritable bowel syndrome)    takes Align daily   Insomnia    takes Trazodone nightly   Joint pain    Multiple sclerosis (HCC)    doesn't take any meds   Multiple sclerosis (HCC)    New onset headache 12/22/2021   Nocturia    Osteoporosis    Palpitations    Peripheral neuropathy    Primary localized osteoarthritis of left knee 08/11/2020   Restless leg syndrome    Seasonal allergies    takes Zyrtec daily;uses Flonase daily as needed   Syncope    when missed methylprednisolone dose   Urinary frequency    takes Flomax daily   Weakness    numbness and tingling   Past Surgical History:  Procedure Laterality Date   ABDOMINAL HYSTERECTOMY  02/07/1986    APPENDECTOMY  02/07/1986   CESAREAN SECTION  1973/1977   x2   CHOLECYSTECTOMY  02/08/1995   COLECTOMY  02/08/1988   COLONOSCOPY     ESOPHAGOGASTRODUODENOSCOPY     EYE SURGERY     bilateral - /w IOL- cataracts   FRACTURE SURGERY Right    rods and screws-right leg   LUMBAR LAMINECTOMY/DECOMPRESSION MICRODISCECTOMY Left 01/24/2013   Procedure: LUMBAR FIVE TO SACRAL ONE LUMBAR LAMINECTOMY/DECOMPRESSION MICRODISCECTOMY 1 LEVEL;  Surgeon: Tia Alert, MD;  Location: [facility];  Service: Neurosurgery;  Laterality: Left;   MAXIMUM ACCESS (MAS)POSTERIOR LUMBAR INTERBODY FUSION (PLIF) 1 LEVEL N/A 06/18/2014   Procedure: MAXIMUM ACCESS SURGERY POSTERIOR LUMBAR INTERBODY FUSION LUMBAR FIVE TO SACRAL ONE ;  Surgeon: Tia Alert, MD;  Location: [facility];  Service: Neurosurgery;  Laterality: N/A;   SPINAL CORD STIMULATOR BATTERY EXCHANGE Right 07/30/2021   Procedure: Spinal cord stimulator battery replacement;  Surgeon: Tia Alert, MD;  Location: [facility];  Service: Neurosurgery;  Laterality: Right;   SPINAL CORD STIMULATOR IMPLANT     TOTAL KNEE ARTHROPLASTY Left 08/24/2020  Procedure: TOTAL KNEE ARTHROPLASTY;  Surgeon: Salvatore Marvel, MD;  Location: [facility];  Service: Orthopedics;  Laterality: Left;   Patient Active Problem List   Diagnosis Date Noted   Lumbar burst fracture (HCC) 08/23/2022   Lumbar vertebral fracture (HCC) 08/19/2022   Chronic migraine w/o aura w/o status migrainosus, not intractable 03/29/2022   Mixed hyperlipidemia 12/27/2021   Chest pain of uncertain etiology 12/27/2021   New onset headache 12/22/2021   S/P lumbar fusion 07/30/2021   Primary localized osteoarthritis of left knee 08/11/2020   Atrophic vaginitis 08/11/2020   Menopausal symptom 08/11/2020   Restless leg syndrome    Chronic back pain    Low back pain without sciatica 11/01/2018   Essential hypertension    SBO (small bowel obstruction) (HCC) 09/01/2016   Fibromyalgia    Abdominal pain, vomiting,  and diarrhea    Dehydration    Somnolence, daytime 10/22/2015   Fatigue 10/22/2015   Chronic leg pain 06/29/2015   S/P lumbar spinal fusion 06/18/2014   Abnormality of gait 02/18/2013   S/P lumbar microdiscectomy 01/24/2013   Hypokalemia 12/24/2012   Sweating abnormality 10/19/2012   Dysautonomia orthostatic hypotension syndrome 08/16/2012   Hereditary and idiopathic peripheral neuropathy 08/16/2012   Chronic adrenal insufficiency (HCC) 08/16/2012   MS (multiple sclerosis) (HCC) 08/15/2012    ONSET DATE: 08/24/2022 (date of referral)  REFERRING DIAG: G35 (ICD-10-CM) - Multiple sclerosis  THERAPY DIAG:  Other lack of coordination  Muscle weakness (generalized)  Tremors of nervous system  Rationale for Evaluation and Treatment: Rehabilitation  SUBJECTIVE:   SUBJECTIVE STATEMENT: Patient reports that she did not do her hand exercises as she was dropping a lot of things this weekend and again this morning.  She did report working with M&Ms and medicine though. She has worn the oval 8 for her R middle finger mostly at night.   Pt accompanied by: self  PERTINENT HISTORY: MS, addison's disease, anemia, anxiety, arthritis, chronic back pain, depression, fibromyalgia, hypotension, IBS, peripheral neuropathy, syncope, spinal cord stimulator, and prior back surgery. Pt underwent fusion of L2-4 and removal of segmental fixation of L3-5 to manage L3 spinal fx sustained during a fall (7/10 date of surgery).   PRECAUTIONS: Fall and Other: Pt recently had back surgery, no precautions reported or listed; Brace for back to be donned when up and moving; no lifting over 10 lbs  - contacted ortho for clarification  WEIGHT BEARING RESTRICTIONS: No  PAIN:  Are you having pain? Yes: NPRS scale: 5/10 Pain location: 4- hands; worse in other parts of body ie) 6 in back Pain description: dull; achey Aggravating factors: "overdoing it" Relieving factors: rest; scheduled medication every 6 hours    FALLS: Has patient fallen in last 6 months? Yes. Number of falls 4 including fall with lumbar fracture while reaching for pencil on the floor.   LIVING ENVIRONMENT: Lives with: lives with their spouse Lives in: House/apartment Stairs: Yes: External: 1 steps; none Has following equipment at home: Single point cane, Walker - 2 wheeled, Environmental consultant - 4 wheeled, Wheelchair (manual), shower chair, bed side commode, and toilet riser  PLOF: Independent; has not driven recently; retired from [employer]  PATIENT GOALS: Improve B hand function, especially R hand  OBJECTIVE:   HAND DOMINANCE: Right  ADLs: Overall ADLs: mostly mod I Eating: husband cuts food for her, which is baseline LB Dressing: min A for donning shoes Bathing: husband cleans back  Tub Shower transfers: min A  IADLs: Shopping: mod A Light housekeeping: doesn't vacuum  at baseline; mod A  Community mobility: SBA Handwriting: 50% legible  MOBILITY STATUS: Needs Assist: SBA with RW  ACTIVITY TOLERANCE: Activity tolerance: fair  FUNCTIONAL OUTCOME MEASURES: PSFS:  Total score = sum of the activity scores/number of activities Minimum detectable change (90%CI) for average score = 2 points Minimum detectable change (90%CI) for single activity score = 3 points  UPPER EXTREMITY ROM:    BUE WNL; however opposition B impaired  UPPER EXTREMITY MMT:     BUE elbow ext and flex WFL  HAND FUNCTION: Grip strength: Right: 34.6 lbs; Left: 22.4 lbs  COORDINATION: 9 Hole Peg test: Right: 40 sec; Left: 38 sec  SENSATION: Light touch: Impaired  and hx of neuropathy; mainly in R index finger  EDEMA: none reported or observed  MUSCLE TONE: WNL  COGNITION: Overall cognitive status: Within functional limits for tasks assessed  VISION: Subjective report: changes in acuities Baseline vision: Wears glasses for reading only and will be getting eye exam Visual history: glaucoma and cataracts  VISION ASSESSMENT: WFL -  to read clock on wall  PERCEPTION: Impaired: proprioception  PRAXIS: WFL  OBSERVATIONS: Pt appears well kept. She has RW with walker bag. Requires SBA for ambulation.    TODAY'S TREATMENT:                                                                                                                               Self Care:   Patient reported dropping things over the weekend including M&M's and medicine, therefore patient engaged in FM tasks to work on motor control and compensation for numbness in fingertips etc.  Patient engaged in [facility] manipulation of small beads to simulate pills and M&M's with suggestions provided to improve coordination, minimize dropping items and improve functional skills.  Patient did ask for information to be provided to her and the following info was typed for her and printed for her home education binder.  Medicine Suggestions Sit at the table to sort/take medicine Use 2 hands to pick up bottles  Pour medicine onto a washcloth Use 2 hands to take medicine ie) look at pills in your open hand, use other hand to take a pill  For the Dropsies Try to make an effort to PICK up things with 2 hands Put small things on a washcloth or scrunched up paper towel to give some texture to hold item in place   For the Numbness Keep working on putty exercises and finding things hidden in putty Rub different textures throughout the day (clothing, washcloths, sponges, Velcro, golf balls, squishy balls   Patient was engaged in sorting beads to simulate pills with practice picking them up off a washcloth, using 2 hands to control pill box and working on picking up multiple (2-3 items) at a time but looking at them in her hand to minimize dropping them.  PATIENT EDUCATION: Education details: See above Person educated: Patient Education method: Explanation, Demonstration, Verbal cues, and Handouts  Education comprehension: verbalized understanding, returned demonstration, verbal  cues required, and needs further education  HOME EXERCISE PROGRAM: 10/12/2022: Coordination and yellow putty HEPs  GOALS:  SHORT TERM GOALS: Target date: 10/18/2022    Patient will demonstrate independence with initial HEP. Baseline: Goal status: IN Progress  2.  Patient will independently verbalize at least 3 energy conservation principles in relation to ADLs to increase functional independence.  Baseline:  Goal status: IN Progress   LONG TERM GOALS: Target date: 11/01/2022    Patient will report at least two-point increase in average PSFS score or at least three-point increase in a single activity score indicating functionally significant improvement given minimum detectable change.  Baseline: 1.3 total score (See above for individual activity scores)  Goal status: INITIAL  2.  Patient will demonstrate at least 30 lbs L grip strength as needed to open jars and other containers.  Baseline: 22.4 lbs Goal status: IN Progress  3.  Patient will demonstrate improvement with nine-hole peg by at least 5 seconds bilaterally. Baseline: Right: 40 sec; Left: 38 sec Goal status: IN Progress  4.  Pt will report handwriting to be at least 75% of prior legibility/neatness. Baseline: 50% legibility Goal status: IN Progress   ASSESSMENT:  CLINICAL IMPRESSION: Patient is a 77 y.o. female who was seen today for occupational therapy treatment for MS and arthritis. Patient currently presents below baseline level of function with functional deficits and impairments in tactile awareness in hand, orientation of R thumb affecting pincer grasp. Pt will benefit from further skilled OT services in the outpatient setting to help pt return to PLOF as able. PERFORMANCE DEFICITS: in functional skills including ADLs, IADLs, coordination, sensation, strength, pain, Fine motor control, mobility, endurance, decreased knowledge of precautions, decreased knowledge of use of DME, and UE functional  use.  IMPAIRMENTS: are limiting patient from ADLs and IADLs.   CO-MORBIDITIES: has co-morbidities such as recent spinal surgery  that affects occupational performance. Patient will benefit from skilled OT to address above impairments and improve overall function.  REHAB POTENTIAL: Fair given chronicity of impairments   PLAN:  OT FREQUENCY: 2x/week  OT DURATION: 6 weeks  PLANNED INTERVENTIONS: self care/ADL training, therapeutic exercise, therapeutic activity, neuromuscular re-education, manual therapy, functional mobility training, electrical stimulation, fluidotherapy, moist heat, patient/family education, energy conservation, DME and/or AE instructions, and Re-evaluation  RECOMMENDED OTHER SERVICES: none at this time  CONSULTED AND AGREED WITH PLAN OF CARE: Patient  PLAN FOR NEXT SESSION:   Review Coordination and putty HEPs  Review, update and provide AE ideas for joint protection  Review memory Binder to collect HEP, resources etc  Check out paraffin for arthritis/hand pain   Victorino Sparrow, OT 10/17/2022, 5:04 PM

## 2022-10-17 NOTE — Patient Instructions (Signed)
WORD Document printed for patient  Medicine Suggestions Sit at the table to sort/take medicine Use 2 hands to pick up bottles  Pour medicine onto a washcloth Use 2 hands to take medicine ie) look at pills in your open hand, use other hand to take a pill  For the Dropsies Try to make an effort to PICK up things with 2 hands Put small things on a washcloth or scrunched up paper towel to give some texture to hold item in place   For the Numbness Keep working on putty exercises and finding things hidden in putty Rub different textures throughout the day (clothing, washcloths, sponges, Velcro, golf balls, squishy balls

## 2022-10-17 NOTE — Therapy (Signed)
OUTPATIENT PHYSICAL THERAPY NEURO TREATMENT   Patient Name: Katrina Rivera MRN: [id number] DOB:[birth year], 77 y.o., female Today's Date: 10/17/2022   PCP: Noberto Retort, MD REFERRING PROVIDER: Charlton Amor, PA-C  END OF SESSION:  PT End of Session - 10/17/22 1148     Visit Number 8    Number of Visits 17    Date for PT Re-Evaluation 11/18/22    Authorization Type [employer]    Progress Note Due on Visit 10    PT Start Time 1145    PT Stop Time 1230    PT Time Calculation (min) 45 min    Equipment Utilized During Treatment Gait belt    Activity Tolerance Patient tolerated treatment well    Behavior During Therapy WFL for tasks assessed/performed                  Past Medical History:  Diagnosis Date   Addison's disease (HCC)    takes Solu Cortef daily   Anemia    takes Ferrous Sulfate daily   Anxiety    takes Xanax nightly   Arthritis    Chronic back pain    stenosis   Depression    takes Cymbalta daily   Dizziness    if b/p drops    Fibromyalgia    History of blood transfusion    no abnormal reaction noted   History of bronchitis    many yrs ago    Hypokalemia    takes Potassium daily   Hypotension    takes Florinef daily   IBS (irritable bowel syndrome)    takes Align daily   Insomnia    takes Trazodone nightly   Joint pain    Multiple sclerosis (HCC)    doesn't take any meds   Multiple sclerosis (HCC)    New onset headache 12/22/2021   Nocturia    Osteoporosis    Palpitations    Peripheral neuropathy    Primary localized osteoarthritis of left knee 08/11/2020   Restless leg syndrome    Seasonal allergies    takes Zyrtec daily;uses Flonase daily as needed   Syncope    when missed methylprednisolone dose   Urinary frequency    takes Flomax daily   Weakness    numbness and tingling   Past Surgical History:  Procedure Laterality Date   ABDOMINAL HYSTERECTOMY  02/07/1986   APPENDECTOMY  02/07/1986   CESAREAN SECTION   1973/1977   x2   CHOLECYSTECTOMY  02/08/1995   COLECTOMY  02/08/1988   COLONOSCOPY     ESOPHAGOGASTRODUODENOSCOPY     EYE SURGERY     bilateral - /w IOL- cataracts   FRACTURE SURGERY Right    rods and screws-right leg   LUMBAR LAMINECTOMY/DECOMPRESSION MICRODISCECTOMY Left 01/24/2013   Procedure: LUMBAR FIVE TO SACRAL ONE LUMBAR LAMINECTOMY/DECOMPRESSION MICRODISCECTOMY 1 LEVEL;  Surgeon: Tia Alert, MD;  Location: [facility];  Service: Neurosurgery;  Laterality: Left;   MAXIMUM ACCESS (MAS)POSTERIOR LUMBAR INTERBODY FUSION (PLIF) 1 LEVEL N/A 06/18/2014   Procedure: MAXIMUM ACCESS SURGERY POSTERIOR LUMBAR INTERBODY FUSION LUMBAR FIVE TO SACRAL ONE ;  Surgeon: Tia Alert, MD;  Location: [facility];  Service: Neurosurgery;  Laterality: N/A;   SPINAL CORD STIMULATOR BATTERY EXCHANGE Right 07/30/2021   Procedure: Spinal cord stimulator battery replacement;  Surgeon: Tia Alert, MD;  Location: [facility];  Service: Neurosurgery;  Laterality: Right;   SPINAL CORD STIMULATOR IMPLANT     TOTAL KNEE ARTHROPLASTY Left 08/24/2020  Procedure: TOTAL KNEE ARTHROPLASTY;  Surgeon: Salvatore Marvel, MD;  Location: [facility];  Service: Orthopedics;  Laterality: Left;   Patient Active Problem List   Diagnosis Date Noted   Lumbar burst fracture (HCC) 08/23/2022   Lumbar vertebral fracture (HCC) 08/19/2022   Chronic migraine w/o aura w/o status migrainosus, not intractable 03/29/2022   Mixed hyperlipidemia 12/27/2021   Chest pain of uncertain etiology 12/27/2021   New onset headache 12/22/2021   S/P lumbar fusion 07/30/2021   Primary localized osteoarthritis of left knee 08/11/2020   Atrophic vaginitis 08/11/2020   Menopausal symptom 08/11/2020   Restless leg syndrome    Chronic back pain    Low back pain without sciatica 11/01/2018   Essential hypertension    SBO (small bowel obstruction) (HCC) 09/01/2016   Fibromyalgia    Abdominal pain, vomiting, and diarrhea    Dehydration    Somnolence,  daytime 10/22/2015   Fatigue 10/22/2015   Chronic leg pain 06/29/2015   S/P lumbar spinal fusion 06/18/2014   Abnormality of gait 02/18/2013   S/P lumbar microdiscectomy 01/24/2013   Hypokalemia 12/24/2012   Sweating abnormality 10/19/2012   Dysautonomia orthostatic hypotension syndrome 08/16/2012   Hereditary and idiopathic peripheral neuropathy 08/16/2012   Chronic adrenal insufficiency (HCC) 08/16/2012   MS (multiple sclerosis) (HCC) 08/15/2012    ONSET DATE: 08-17-22  REFERRING DIAG: G35 (ICD-10-CM) - Multiple sclerosis   Diagnosis  S32.039D (ICD-10-CM) - Closed fracture of third lumbar vertebra with routine healing, unspecified fracture morphology, subsequent encounter    THERAPY DIAG:  Muscle weakness (generalized)  MS (multiple sclerosis) (HCC)  Unsteadiness on feet  Other abnormalities of gait and mobility  Difficulty in walking, not elsewhere classified  Abnormality of gait  Rationale for Evaluation and Treatment: Rehabilitation  SUBJECTIVE:                                                                                                                                                                                             SUBJECTIVE STATEMENT: Pt reports she did use her exercise bike over the weekend but needed help getting her feet in the pedals from her husband. Pt reports ongoing back pain, plans to call Dr. Yetta Barre office about the pain, 6/10 pain today. Pt feels like she is dropping things more with her hands and will find herself reacting by bending forwards which may contribute to her back pain.  Pt accompanied by: self  PERTINENT HISTORY: spinal cord stimulator implant; MS diagnosed in 1984; s/p 2 back surgeries  Per Chart note "Brief HPI:   Katrina Rivera is a 77 y.o. female with a history of relapsing remitting  multiple sclerosis who was evaluated by Dr. Yetta Barre regarding gait abnormality, chronic low back pain and status post lumbar fusion May 2024.  She has reportedly fallen 4 times at home after PLIF of L3-5. She was admitted 08/17/2022 for scheduled surgery of L3 spinal fracture. She underwent posterior fixation L2-L4 using NuVasive cortical pedicle screws, arthrodesis and removal of segmental fixation L3-L5 by Dr. Yetta Barre. PT OT evaluations carried out. Incision is healed without signs of infection. She is appropriate back soreness. She has no complaints of leg pain or new numbness, tingling or weakness per Dr. Barnett Applebaum note today. Signs are stable and she is tolerating regular diet. Other past medical history includes restless leg syndrome, anxiety, headaches with migraine features, fibromyalgia, hypotension, anemia (acute on chronic)."    PAIN:  Are you having pain? Yes: NPRS scale: 6/10 Pain location: back pain  Pain description: tightness, throbbing, sore Aggravating factors: no specific Relieving factors: Brace helps Pt also reports pain in legs  PRECAUTIONS: Back  - No bending, lifting >10#, twisting, reaching overhead; Dr Yetta Barre responded with "regular back precautions" via inbasket message inquiring about precautions  RED FLAGS: None   WEIGHT BEARING RESTRICTIONS: No  FALLS: Has patient fallen in last 6 months? Yes. Number of falls 4  LIVING ENVIRONMENT: Lives with: lives with their spouse Lives in: House/apartment Stairs: Yes: External: 1 steps; planning on having rail installed Has following equipment at home: Walker - 2 wheeled, shower chair, and Grab bars  PLOF: Independent with household mobility with device, Requires assistive device for independence, and Needs assistance with ADLs  PATIENT GOALS: "walk without walker and back brace and get back to where I was before"  OBJECTIVE:   DIAGNOSTIC FINDINGS: IMPRESSION: 1. Unhealed fracture of the posterosuperior L3 body, ans associated avulsed appearance of both L3 pedicles (series 7, image 30) along the course of the bilateral L3 pedicle screws. Mild retropulsion  of the posterior body fragment does not result in significant spinal stenosis. 2. No other acute osseous abnormality identified in the Lumbar Spine. But scant arthrodesis limited to only the right L5-S1 posterior elements following L3 through S1 fusion. Some endplate subsidence at each level. 3.  Aortic Atherosclerosis (ICD10-I70.0).  Nephrolithiasis.  COGNITION: Overall cognitive status: Within functional limits for tasks assessed   SENSATION: Impaired sensation in arms and legs - numbness and tingling  COORDINATION: WFL's - slowed movement due to back pain  POSTURE: rounded shoulders and forward head  LOWER EXTREMITY ROM:   WFL's bil. LE's   LOWER EXTREMITY MMT:  grossly 4/5 throughout but pain with resistance due to back pain (surgical site)  BED MOBILITY:  Independent   TRANSFERS: Assistive device utilized: Environmental consultant - 4 wheeled  Sit to stand: Modified independence Stand to sit: Modified independence  STAIRS:  TBA  GAIT: Gait pattern:  increased knee flexion bil. LE's in stance  and step through pattern Distance walked: 34' Assistive device utilized: Environmental consultant - 2 wheeled Level of assistance: SBA Comments: pt amb. With bil. Knees flexed  FUNCTIONAL TESTS:  5 times sit to stand: 21.47 secs from chair without UE support with close CGA Timed up and go (TUG): 21.47 secs with RW 10 meter walk test: 21.56 = 1.52 ft/sec with RW Berg Balance Scale: to be assessed  - pt stood for 2" without UE support with SBA  PATIENT SURVEYS:  N/A - dx is MS  TODAY'S TREATMENT:  TherAct Demonstrated use of theracane, educated pt to avoid use over spine and bony prominences as well as to be mindful of her skin and bruising. Pt knows where to purchase device on [employer] if interested in purchasing it.  In // bars to work on [employer] stability: Alt L/R gumdrop taps with forwards  gait 6 x 10 ft with no UE support and min A for balance Sidesteps with alt L/R gumdrop taps 3 x 10 ft L/R with intermittent UE support and CGA for balance  TherEx Supine TA contract x 10 reps with 5 sec hold Supine bridge with TA contract 2 x 10 reps with 5 sec hold Supine alt L/R marches with TA contract x 10 reps  Added bridges to HEP, see bolded below   Gait:   Gait pattern: decreased step length- Right, decreased step length- Left, and knee flexed in stance- Left Distance walked: various clinic distances Assistive device utilized: None Level of assistance: CGA Comments: gait with no AD    PATIENT EDUCATION: Education details: Continue HEP, added to HEP Person educated: Patient Education method: Programmer, multimedia, Facilities manager, Actor cues, Verbal cues, and Handouts Education comprehension: verbalized understanding, returned demonstration, and needs further education  HOME EXERCISE PROGRAM: Access Code: [email or web address] URL: [email or web address] Date: 09/26/2022 Prepared by: Peter Congo  Exercises - Ankle Dorsiflexion with Resistance  - 1 x daily - 7 x weekly - 3 sets - 10 reps - Gastroc Stretch on Wall  - 1 x daily - 7 x weekly - 3 sets - 10 reps - Supine Knee Extension Stretch on Towel Roll  - 1 x daily - 7 x weekly - Supine Quad Set  - 1 x daily - 7 x weekly - 3 sets - 10 reps - Sit to Stand with Arms Crossed  - 1 x daily - 7 x weekly - 3 sets - 10 reps - Supine Bridge  - 1 x daily - 7 x weekly - 3 sets - 10 reps   Access Code: [email or web address] - added 10-11-22 URL: [email or web address] Date: 10/12/2022 Prepared by: Maebelle Munroe  Exercises - Supine hamstring stretch  - 1 x daily - 7 x weekly - 1 sets - 2-3 reps - 60 sec hold  Access Code: [email or web address] URL: [email or web address] Date: 10/07/2022 Prepared by: Maebelle Munroe  Exercises - Single Leg Stance with Support  - 1 x daily - 7 x weekly - 1 sets - 2-3 reps - 10 sec hold   HEP  issued: 10-04-22, updated 10-11-22 Access Code: [email or web address]:  added SLS exercise - Access Code: [email or web address] 10-06-22 URL: [email or web address] Date: 10/05/2022 Prepared by: Maebelle Munroe  Exercises - Alternating Step Forward with Support  - 1 x daily - 7 x weekly - 1 sets - 10 reps - Step Sideways  - 1 x daily - 7 x weekly - 1 sets - 10 reps - Alternating Step Backward with Support  - 1 x daily - 7 x weekly - 1 sets - 10 reps - Side Stepping with Counter Support  - 1 x daily - 7 x weekly - 1 sets - 2-3 reps - Standing March with Counter Support  - 1 x daily - 7 x weekly - 1 sets - 10 reps   GOALS: Goals reviewed with patient? Yes  SHORT TERM GOALS: Target date: 10-21-22  Improve Berg score by at least 4 points to reduce fall risk. Baseline: 30/56 (8/19) Goal status: INITIAL  2.  Pt will ambulate 230' without device with CGA on flat, even surface. Baseline:  Goal status: INITIAL  3.  Report decreased back pain to </= 3/10 intensity for increased ease & comfort with ADL's & mobility.  Baseline: 5/10 Goal status: INITIAL  4.  Improve 5x sit to stand score to </= 17 secs from chair without UE support to demo improved LE strength.  Baseline: 21.47 secs Goal status: INITIAL  5.  Improve TUG score to </= 17.5 secs with RW to demo improved functional mobility.  Baseline: 21.47 secs with RW Goal status: INITIAL  6.  Increase gait speed to >/= 1.9 ft/sec with RW with pt demonstrating bil. Knee extension in stance.  Baseline: 1.52 ft/sec with RW (21.56 secs) Goal status: INITIAL   LONG TERM GOALS: Target date: 11-18-22  Improve Berg score by at least 8 points to reduce fall risk & demo improved balance. Baseline: 30/56 (8/19) Goal status: INITIAL  2.  Pt will be modified independent with household ambulation without device. Baseline:  Goal status: INITIAL  3.  Improve 5x sit to stand score to </= 14 secs from chair without UE support to demo improved LE strength.   Baseline: 21.47 secs Goal status: INITIAL  4.  Improve TUG score to </= 14.5 secs with RW to demo improved functional mobility.  Baseline: 21.47 secs with RW Goal status: INITIAL  5.   Increase gait speed to >/= 2.3 ft/sec with RW with pt demonstrating bil. Knee extension in stance.  Baseline: 1.52 ft/sec with RW (21.56 secs) Goal status: INITIAL  6.  Independent in HEP for balance and LE strengthening and core stabilization exercises. Baseline:  Goal status: INITIAL  ASSESSMENT:  CLINICAL IMPRESSION: Emphasis of skilled PT session on adding core stabilization exercises to HEP and working on core stabilization exercises in clinic, demonstrating how to safely use theracane, and continuing to work on dynamic balance and SLS stability. Pt continues to exhibit impaired standing balance and increased fall risk. Pt continues to benefit from skilled therapy services to work towards increased safety and independence with functional mobility. Continue POC.    OBJECTIVE IMPAIRMENTS: Abnormal gait, decreased activity tolerance, decreased balance, decreased strength, and pain.   ACTIVITY LIMITATIONS: carrying, lifting, bending, squatting, stairs, transfers, reach over head, and locomotion level  PARTICIPATION LIMITATIONS: meal prep, cleaning, laundry, shopping, community activity, and pt reports she does not drive  PERSONAL FACTORS: Past/current experiences, Time since onset of injury/illness/exacerbation, and 1 comorbidity: MS and s/p back surgery  are also affecting patient's functional outcome.   REHAB POTENTIAL: Good  CLINICAL DECISION MAKING: Evolving/moderate complexity  EVALUATION COMPLEXITY: Moderate  PLAN:  PT FREQUENCY: 2x/week  PT DURATION: 8 weeks  PLANNED INTERVENTIONS: Therapeutic exercises, Therapeutic activity, Neuromuscular re-education, Balance training, Gait training, Patient/Family education, Self Care, Stair training, DME instructions, and Aquatic Therapy  PLAN  FOR NEXT SESSION: Cont gait training, LE strengthening, endurance, balance on compliant surfaces, assess STG  Peter Congo, PT, DPT, CSRS  10/17/2022, 12:33 PM

## 2022-10-18 DIAGNOSIS — Z981 Arthrodesis status: Secondary | ICD-10-CM | POA: Diagnosis not present

## 2022-10-19 ENCOUNTER — Ambulatory Visit: Payer: PPO | Admitting: Physical Therapy

## 2022-10-19 ENCOUNTER — Ambulatory Visit: Payer: PPO | Admitting: Occupational Therapy

## 2022-10-19 DIAGNOSIS — M25541 Pain in joints of right hand: Secondary | ICD-10-CM

## 2022-10-19 DIAGNOSIS — R29818 Other symptoms and signs involving the nervous system: Secondary | ICD-10-CM

## 2022-10-19 DIAGNOSIS — R269 Unspecified abnormalities of gait and mobility: Secondary | ICD-10-CM

## 2022-10-19 DIAGNOSIS — M6281 Muscle weakness (generalized): Secondary | ICD-10-CM | POA: Diagnosis not present

## 2022-10-19 DIAGNOSIS — R278 Other lack of coordination: Secondary | ICD-10-CM

## 2022-10-19 DIAGNOSIS — R251 Tremor, unspecified: Secondary | ICD-10-CM

## 2022-10-19 DIAGNOSIS — G35 Multiple sclerosis: Secondary | ICD-10-CM

## 2022-10-19 DIAGNOSIS — F411 Generalized anxiety disorder: Secondary | ICD-10-CM | POA: Diagnosis not present

## 2022-10-19 DIAGNOSIS — R2689 Other abnormalities of gait and mobility: Secondary | ICD-10-CM

## 2022-10-19 DIAGNOSIS — R2681 Unsteadiness on feet: Secondary | ICD-10-CM

## 2022-10-19 DIAGNOSIS — R262 Difficulty in walking, not elsewhere classified: Secondary | ICD-10-CM

## 2022-10-19 NOTE — Patient Instructions (Addendum)

## 2022-10-19 NOTE — Therapy (Signed)
OUTPATIENT OCCUPATIONAL THERAPY NEURO TREATMENT  Patient Name: Katrina Rivera MRN: [id number] DOB:[birth year], 77 y.o., female Today's Date: 10/19/2022  PCP: Noberto Retort, MD  REFERRING PROVIDER: Charlton Amor, PA-C  END OF SESSION:  OT End of Session - 10/19/22 1321     Visit Number 5    Number of Visits 13    Date for OT Re-Evaluation 11/18/22    Authorization Type [employer] - follows Medicare guidelines    Progress Note Due on Visit 10    OT Start Time 1320    OT Stop Time 1401    OT Time Calculation (min) 41 min    Equipment Utilized During Treatment FM tasks (pill box)    Activity Tolerance Patient tolerated treatment well    Behavior During Therapy WFL for tasks assessed/performed             Past Medical History:  Diagnosis Date   Addison's disease (HCC)    takes Solu Cortef daily   Anemia    takes Ferrous Sulfate daily   Anxiety    takes Xanax nightly   Arthritis    Chronic back pain    stenosis   Depression    takes Cymbalta daily   Dizziness    if b/p drops    Fibromyalgia    History of blood transfusion    no abnormal reaction noted   History of bronchitis    many yrs ago    Hypokalemia    takes Potassium daily   Hypotension    takes Florinef daily   IBS (irritable bowel syndrome)    takes Align daily   Insomnia    takes Trazodone nightly   Joint pain    Multiple sclerosis (HCC)    doesn't take any meds   Multiple sclerosis (HCC)    New onset headache 12/22/2021   Nocturia    Osteoporosis    Palpitations    Peripheral neuropathy    Primary localized osteoarthritis of left knee 08/11/2020   Restless leg syndrome    Seasonal allergies    takes Zyrtec daily;uses Flonase daily as needed   Syncope    when missed methylprednisolone dose   Urinary frequency    takes Flomax daily   Weakness    numbness and tingling   Past Surgical History:  Procedure Laterality Date   ABDOMINAL HYSTERECTOMY  02/07/1986    APPENDECTOMY  02/07/1986   CESAREAN SECTION  1973/1977   x2   CHOLECYSTECTOMY  02/08/1995   COLECTOMY  02/08/1988   COLONOSCOPY     ESOPHAGOGASTRODUODENOSCOPY     EYE SURGERY     bilateral - /w IOL- cataracts   FRACTURE SURGERY Right    rods and screws-right leg   LUMBAR LAMINECTOMY/DECOMPRESSION MICRODISCECTOMY Left 01/24/2013   Procedure: LUMBAR FIVE TO SACRAL ONE LUMBAR LAMINECTOMY/DECOMPRESSION MICRODISCECTOMY 1 LEVEL;  Surgeon: Tia Alert, MD;  Location: [facility];  Service: Neurosurgery;  Laterality: Left;   MAXIMUM ACCESS (MAS)POSTERIOR LUMBAR INTERBODY FUSION (PLIF) 1 LEVEL N/A 06/18/2014   Procedure: MAXIMUM ACCESS SURGERY POSTERIOR LUMBAR INTERBODY FUSION LUMBAR FIVE TO SACRAL ONE ;  Surgeon: Tia Alert, MD;  Location: [facility];  Service: Neurosurgery;  Laterality: N/A;   SPINAL CORD STIMULATOR BATTERY EXCHANGE Right 07/30/2021   Procedure: Spinal cord stimulator battery replacement;  Surgeon: Tia Alert, MD;  Location: [facility] OR;  Service: Neurosurgery;  Laterality: Right;   SPINAL CORD STIMULATOR IMPLANT     TOTAL KNEE ARTHROPLASTY Left 08/24/2020  Procedure: TOTAL KNEE ARTHROPLASTY;  Surgeon: Salvatore Marvel, MD;  Location: [facility];  Service: Orthopedics;  Laterality: Left;   Patient Active Problem List   Diagnosis Date Noted   Lumbar burst fracture (HCC) 08/23/2022   Lumbar vertebral fracture (HCC) 08/19/2022   Chronic migraine w/o aura w/o status migrainosus, not intractable 03/29/2022   Mixed hyperlipidemia 12/27/2021   Chest pain of uncertain etiology 12/27/2021   New onset headache 12/22/2021   S/P lumbar fusion 07/30/2021   Primary localized osteoarthritis of left knee 08/11/2020   Atrophic vaginitis 08/11/2020   Menopausal symptom 08/11/2020   Restless leg syndrome    Chronic back pain    Low back pain without sciatica 11/01/2018   Essential hypertension    SBO (small bowel obstruction) (HCC) 09/01/2016   Fibromyalgia    Abdominal pain, vomiting,  and diarrhea    Dehydration    Somnolence, daytime 10/22/2015   Fatigue 10/22/2015   Chronic leg pain 06/29/2015   S/P lumbar spinal fusion 06/18/2014   Abnormality of gait 02/18/2013   S/P lumbar microdiscectomy 01/24/2013   Hypokalemia 12/24/2012   Sweating abnormality 10/19/2012   Dysautonomia orthostatic hypotension syndrome 08/16/2012   Hereditary and idiopathic peripheral neuropathy 08/16/2012   Chronic adrenal insufficiency (HCC) 08/16/2012   MS (multiple sclerosis) (HCC) 08/15/2012    ONSET DATE: 08/24/2022 (date of referral)  REFERRING DIAG: G35 (ICD-10-CM) - Multiple sclerosis  THERAPY DIAG:  Other lack of coordination  Muscle weakness (generalized)  Tremors of nervous system  MS (multiple sclerosis) (HCC)  Joint pain in both hands  Other symptoms and signs involving the nervous system  Rationale for Evaluation and Treatment: Rehabilitation  SUBJECTIVE:   SUBJECTIVE STATEMENT: Patient reports she frequently forgets the name of items such as the brand of food her husband likes. She states this is frustrating and feels that her memory has drastically decreased since her most recent surgeries. Overall, she is feeling very overwhelmed. She has spoken with PCP about this, and he has added Wellbutrin to her medications though she has not yet started taking it.   Pt accompanied by: self  PERTINENT HISTORY: MS, addison's disease, anemia, anxiety, arthritis, chronic back pain, depression, fibromyalgia, hypotension, IBS, peripheral neuropathy, syncope, spinal cord stimulator, and prior back surgery. Pt underwent fusion of L2-4 and removal of segmental fixation of L3-5 to manage L3 spinal fx sustained during a fall (7/10 date of surgery).   PRECAUTIONS: Fall and Other: Pt recently had back surgery, no precautions reported or listed; Brace for back to be donned when up and moving; no lifting over 10 lbs  - contacted ortho for clarification  WEIGHT BEARING RESTRICTIONS:  No  PAIN:  Are you having pain? Yes: NPRS scale: 6/10 Pain location: 4- hands; worse in back Pain description: dull; achey Aggravating factors: "overdoing it" Relieving factors: rest; scheduled medication every 6 hours   FALLS: Has patient fallen in last 6 months? Yes. Number of falls 4 including fall with lumbar fracture while reaching for pencil on the floor.   LIVING ENVIRONMENT: Lives with: lives with their spouse Lives in: House/apartment Stairs: Yes: External: 1 steps; none Has following equipment at home: Single point cane, Walker - 2 wheeled, Environmental consultant - 4 wheeled, Wheelchair (manual), shower chair, bed side commode, and toilet riser  PLOF: Independent; has not driven recently; retired from [employer]  PATIENT GOALS: Improve B hand function, especially R hand  OBJECTIVE:   HAND DOMINANCE: Right  ADLs: Overall ADLs: mostly mod I Eating: husband cuts food for  her, which is baseline LB Dressing: min A for donning shoes Bathing: husband cleans back  Tub Shower transfers: min A  IADLs: Shopping: mod A Light housekeeping: doesn't vacuum at baseline; mod A  Community mobility: SBA Handwriting: 50% legible  MOBILITY STATUS: Needs Assist: SBA with RW  ACTIVITY TOLERANCE: Activity tolerance: fair  FUNCTIONAL OUTCOME MEASURES: PSFS:  Total score = sum of the activity scores/number of activities Minimum detectable change (90%CI) for average score = 2 points Minimum detectable change (90%CI) for single activity score = 3 points  UPPER EXTREMITY ROM:    BUE WNL; however opposition B impaired  UPPER EXTREMITY MMT:     BUE elbow ext and flex WFL  HAND FUNCTION: Grip strength: Right: 34.6 lbs; Left: 22.4 lbs  COORDINATION: 9 Hole Peg test: Right: 40 sec; Left: 38 sec  SENSATION: Light touch: Impaired  and hx of neuropathy; mainly in R index finger  EDEMA: none reported or observed  MUSCLE TONE: WNL  COGNITION: Overall cognitive status: Within functional  limits for tasks assessed  VISION: Subjective report: changes in acuities Baseline vision: Wears glasses for reading only and will be getting eye exam Visual history: glaucoma and cataracts  VISION ASSESSMENT: WFL - to read clock on wall  PERCEPTION: Impaired: proprioception  PRAXIS: WFL  OBSERVATIONS: Pt appears well kept. She has RW with walker bag. Requires SBA for ambulation.    TODAY'S TREATMENT:                                                                                                                               Self Care:  OT educated pt on use of WARM memory strategy as noted in pt instructions. Practiced using this strategy with various food items.   PATIENT EDUCATION: Education details: See above Person educated: Patient Education method: Explanation, Demonstration, Verbal cues, and Handouts Education comprehension: verbalized understanding, returned demonstration, verbal cues required, and needs further education  HOME EXERCISE PROGRAM: 10/12/2022: Coordination and yellow putty HEPs 10/19/2022: WARM memory strategy  GOALS:  SHORT TERM GOALS: Target date: 10/18/2022    Patient will demonstrate independence with initial HEP. Baseline: Goal status: IN Progress  2.  Patient will independently verbalize at least 3 energy conservation principles in relation to ADLs to increase functional independence.  Baseline:  Goal status: IN Progress   LONG TERM GOALS: Target date: 11/01/2022    Patient will report at least two-point increase in average PSFS score or at least three-point increase in a single activity score indicating functionally significant improvement given minimum detectable change.  Baseline: 1.3 total score (See above for individual activity scores)  Goal status: INITIAL  2.  Patient will demonstrate at least 30 lbs L grip strength as needed to open jars and other containers.  Baseline: 22.4 lbs Goal status: IN Progress  3.  Patient will  demonstrate improvement with nine-hole peg by at least 5 seconds bilaterally. Baseline: Right: 40 sec; Left: 38 sec Goal  status: IN Progress  4.  Pt will report handwriting to be at least 75% of prior legibility/neatness. Baseline: 50% legibility Goal status: IN Progress   ASSESSMENT:  CLINICAL IMPRESSION: Patient is a 77 y.o. female who was seen today for occupational therapy treatment for MS and arthritis. Patient currently presents below baseline level of function with functional deficits and impairments in tactile awareness in hand, orientation of R thumb affecting pincer grasp. Pt will benefit from further skilled OT services in the outpatient setting to help pt return to PLOF as able. PERFORMANCE DEFICITS: in functional skills including ADLs, IADLs, coordination, sensation, strength, pain, Fine motor control, mobility, endurance, decreased knowledge of precautions, decreased knowledge of use of DME, and UE functional use.  IMPAIRMENTS: are limiting patient from ADLs and IADLs.   CO-MORBIDITIES: has co-morbidities such as recent spinal surgery  that affects occupational performance. Patient will benefit from skilled OT to address above impairments and improve overall function.  REHAB POTENTIAL: Fair given chronicity of impairments   PLAN:  OT FREQUENCY: 2x/week  OT DURATION: 6 weeks  PLANNED INTERVENTIONS: self care/ADL training, therapeutic exercise, therapeutic activity, neuromuscular re-education, manual therapy, functional mobility training, electrical stimulation, fluidotherapy, moist heat, patient/family education, energy conservation, DME and/or AE instructions, and Re-evaluation  RECOMMENDED OTHER SERVICES: none at this time  CONSULTED AND AGREED WITH PLAN OF CARE: Patient  PLAN FOR NEXT SESSION:   Review Coordination and putty HEPs (check understanding for goal progression)  Review, update and provide AE ideas for joint protection  Review memory Binder to collect  HEP, resources etc  Check out paraffin for arthritis/hand pain   Delana Meyer, OT 10/19/2022, 4:12 PM

## 2022-10-19 NOTE — Therapy (Signed)
OUTPATIENT PHYSICAL THERAPY NEURO TREATMENT   Patient Name: Katrina Rivera MRN: [id number] DOB:1945-06-26, 77 y.o., female Today's Date: 10/19/2022   PCP: Noberto Retort, MD REFERRING PROVIDER: Charlton Amor, PA-C  END OF SESSION:  PT End of Session - 10/19/22 1405     Visit Number 9    Number of Visits 17    Date for PT Re-Evaluation 11/18/22    Authorization Type [employer]    Progress Note Due on Visit 10    PT Start Time 1403   from OT session   PT Stop Time 1443    PT Time Calculation (min) 40 min    Equipment Utilized During Treatment Gait belt    Activity Tolerance Patient tolerated treatment well    Behavior During Therapy WFL for tasks assessed/performed                   Past Medical History:  Diagnosis Date   Addison's disease (HCC)    takes Solu Cortef daily   Anemia    takes Ferrous Sulfate daily   Anxiety    takes Xanax nightly   Arthritis    Chronic back pain    stenosis   Depression    takes Cymbalta daily   Dizziness    if b/p drops    Fibromyalgia    History of blood transfusion    no abnormal reaction noted   History of bronchitis    many yrs ago    Hypokalemia    takes Potassium daily   Hypotension    takes Florinef daily   IBS (irritable bowel syndrome)    takes Align daily   Insomnia    takes Trazodone nightly   Joint pain    Multiple sclerosis (HCC)    doesn't take any meds   Multiple sclerosis (HCC)    New onset headache 12/22/2021   Nocturia    Osteoporosis    Palpitations    Peripheral neuropathy    Primary localized osteoarthritis of left knee 08/11/2020   Restless leg syndrome    Seasonal allergies    takes Zyrtec daily;uses Flonase daily as needed   Syncope    when missed methylprednisolone dose   Urinary frequency    takes Flomax daily   Weakness    numbness and tingling   Past Surgical History:  Procedure Laterality Date   ABDOMINAL HYSTERECTOMY  02/07/1986   APPENDECTOMY  02/07/1986    CESAREAN SECTION  1973/1977   x2   CHOLECYSTECTOMY  02/08/1995   COLECTOMY  02/08/1988   COLONOSCOPY     ESOPHAGOGASTRODUODENOSCOPY     EYE SURGERY     bilateral - /w IOL- cataracts   FRACTURE SURGERY Right    rods and screws-right leg   LUMBAR LAMINECTOMY/DECOMPRESSION MICRODISCECTOMY Left 01/24/2013   Procedure: LUMBAR FIVE TO SACRAL ONE LUMBAR LAMINECTOMY/DECOMPRESSION MICRODISCECTOMY 1 LEVEL;  Surgeon: Tia Alert, MD;  Location: [facility];  Service: Neurosurgery;  Laterality: Left;   MAXIMUM ACCESS (MAS)POSTERIOR LUMBAR INTERBODY FUSION (PLIF) 1 LEVEL N/A 06/18/2014   Procedure: MAXIMUM ACCESS SURGERY POSTERIOR LUMBAR INTERBODY FUSION LUMBAR FIVE TO SACRAL ONE ;  Surgeon: Tia Alert, MD;  Location: [facility];  Service: Neurosurgery;  Laterality: N/A;   SPINAL CORD STIMULATOR BATTERY EXCHANGE Right 07/30/2021   Procedure: Spinal cord stimulator battery replacement;  Surgeon: Tia Alert, MD;  Location: [facility] OR;  Service: Neurosurgery;  Laterality: Right;   SPINAL CORD STIMULATOR IMPLANT     TOTAL  KNEE ARTHROPLASTY Left 08/24/2020   Procedure: TOTAL KNEE ARTHROPLASTY;  Surgeon: Salvatore Marvel, MD;  Location: [facility];  Service: Orthopedics;  Laterality: Left;   Patient Active Problem List   Diagnosis Date Noted   Lumbar burst fracture (HCC) 08/23/2022   Lumbar vertebral fracture (HCC) 08/19/2022   Chronic migraine w/o aura w/o status migrainosus, not intractable 03/29/2022   Mixed hyperlipidemia 12/27/2021   Chest pain of uncertain etiology 12/27/2021   New onset headache 12/22/2021   S/P lumbar fusion 07/30/2021   Primary localized osteoarthritis of left knee 08/11/2020   Atrophic vaginitis 08/11/2020   Menopausal symptom 08/11/2020   Restless leg syndrome    Chronic back pain    Low back pain without sciatica 11/01/2018   Essential hypertension    SBO (small bowel obstruction) (HCC) 09/01/2016   Fibromyalgia    Abdominal pain, vomiting, and diarrhea     Dehydration    Somnolence, daytime 10/22/2015   Fatigue 10/22/2015   Chronic leg pain 06/29/2015   S/P lumbar spinal fusion 06/18/2014   Abnormality of gait 02/18/2013   S/P lumbar microdiscectomy 01/24/2013   Hypokalemia 12/24/2012   Sweating abnormality 10/19/2012   Dysautonomia orthostatic hypotension syndrome 08/16/2012   Hereditary and idiopathic peripheral neuropathy 08/16/2012   Chronic adrenal insufficiency (HCC) 08/16/2012   MS (multiple sclerosis) (HCC) 08/15/2012    ONSET DATE: 08-17-22  REFERRING DIAG: G35 (ICD-10-CM) - Multiple sclerosis   Diagnosis  S32.039D (ICD-10-CM) - Closed fracture of third lumbar vertebra with routine healing, unspecified fracture morphology, subsequent encounter    THERAPY DIAG:  Muscle weakness (generalized)  MS (multiple sclerosis) (HCC)  Unsteadiness on feet  Other abnormalities of gait and mobility  Difficulty in walking, not elsewhere classified  Abnormality of gait  Rationale for Evaluation and Treatment: Rehabilitation  SUBJECTIVE:                                                                                                                                                                                             SUBJECTIVE STATEMENT: Pt reports she continues to have back pain, did call Dr. Yetta Barre' office and was prescribed some medication for pain as well as for anxiety. Pt has not taken the medicine yet, back pain is 6/10. Pt states, "I felt like "butter" after last session."  Pt accompanied by: self  PERTINENT HISTORY: spinal cord stimulator implant; MS diagnosed in 1984; s/p 2 back surgeries  Per Chart note "Brief HPI:   Katrina Rivera is a 77 y.o. female with a history of relapsing remitting multiple sclerosis who was evaluated by Dr. Yetta Barre regarding gait abnormality, chronic low back pain  and status post lumbar fusion May 2024. She has reportedly fallen 4 times at home after PLIF of L3-5. She was admitted 08/17/2022  for scheduled surgery of L3 spinal fracture. She underwent posterior fixation L2-L4 using NuVasive cortical pedicle screws, arthrodesis and removal of segmental fixation L3-L5 by Dr. Yetta Barre. PT OT evaluations carried out. Incision is healed without signs of infection. She is appropriate back soreness. She has no complaints of leg pain or new numbness, tingling or weakness per Dr. Barnett Applebaum note today. Signs are stable and she is tolerating regular diet. Other past medical history includes restless leg syndrome, anxiety, headaches with migraine features, fibromyalgia, hypotension, anemia (acute on chronic)."    PAIN:  Are you having pain? Yes: NPRS scale: 6/10 Pain location: back pain  Pain description: tightness, throbbing, sore Aggravating factors: no specific Relieving factors: Brace helps Pt also reports pain in legs  PRECAUTIONS: Back  - No bending, lifting >10#, twisting, reaching overhead; Dr Yetta Barre responded with "regular back precautions" via inbasket message inquiring about precautions  RED FLAGS: None   WEIGHT BEARING RESTRICTIONS: No  FALLS: Has patient fallen in last 6 months? Yes. Number of falls 4  LIVING ENVIRONMENT: Lives with: lives with their spouse Lives in: House/apartment Stairs: Yes: External: 1 steps; planning on having rail installed Has following equipment at home: Walker - 2 wheeled, shower chair, and Grab bars  PLOF: Independent with household mobility with device, Requires assistive device for independence, and Needs assistance with ADLs  PATIENT GOALS: "walk without walker and back brace and get back to where I was before"  OBJECTIVE:   DIAGNOSTIC FINDINGS: IMPRESSION: 1. Unhealed fracture of the posterosuperior L3 body, ans associated avulsed appearance of both L3 pedicles (series 7, image 30) along the course of the bilateral L3 pedicle screws. Mild retropulsion of the posterior body fragment does not result in significant spinal stenosis. 2. No other  acute osseous abnormality identified in the Lumbar Spine. But scant arthrodesis limited to only the right L5-S1 posterior elements following L3 through S1 fusion. Some endplate subsidence at each level. 3.  Aortic Atherosclerosis (ICD10-I70.0).  Nephrolithiasis.  COGNITION: Overall cognitive status: Within functional limits for tasks assessed   SENSATION: Impaired sensation in arms and legs - numbness and tingling  COORDINATION: WFL's - slowed movement due to back pain  POSTURE: rounded shoulders and forward head  LOWER EXTREMITY ROM:   WFL's bil. LE's   LOWER EXTREMITY MMT:  grossly 4/5 throughout but pain with resistance due to back pain (surgical site)  BED MOBILITY:  Independent   TRANSFERS: Assistive device utilized: Environmental consultant - 4 wheeled  Sit to stand: Modified independence Stand to sit: Modified independence  STAIRS:  TBA  GAIT: Gait pattern:  increased knee flexion bil. LE's in stance  and step through pattern Distance walked: 54' Assistive device utilized: Environmental consultant - 2 wheeled Level of assistance: SBA Comments: pt amb. With bil. Knees flexed  FUNCTIONAL TESTS:  5 times sit to stand: 21.47 secs from chair without UE support with close CGA Timed up and go (TUG): 21.47 secs with RW 10 meter walk test: 21.56 = 1.52 ft/sec with RW Berg Balance Scale: to be assessed  - pt stood for 2" without UE support with SBA  PATIENT SURVEYS:  N/A - dx is MS  TODAY'S TREATMENT:  TherAct For STG assessment:  [facility] PT Assessment - 10/19/22 1410       Ambulation/Gait   Gait velocity 32.8 ft over 21.53 sec = 1.52 ft/sec      Standardized Balance Assessment   Standardized Balance Assessment Berg Balance Test;Timed Up and Go Test;Five Times Sit to Stand    Five times sit to stand comments  15.62 sec   no UE support     Berg Balance Test   Sit to Stand Able to  stand without using hands and stabilize independently    Standing Unsupported Able to stand safely 2 minutes    Sitting with Back Unsupported but Feet Supported on Floor or Stool Able to sit safely and securely 2 minutes    Stand to Sit Sits safely with minimal use of hands    Transfers Able to transfer safely, minor use of hands    Standing Unsupported with Eyes Closed Able to stand 10 seconds with supervision    Standing Unsupported with Feet Together Able to place feet together independently and stand for 1 minute with supervision    From Standing, Reach Forward with Outstretched Arm Loses balance while trying/requires external support   unable to due to back precautions and pain   From Standing Position, Pick up Object from Floor Unable to try/needs assist to keep balance    From Standing Position, Turn to Look Behind Over each Shoulder Turn sideways only but maintains balance    Turn 360 Degrees Needs close supervision or verbal cueing    Standing Unsupported, Alternately Place Feet on Step/Stool Able to complete >2 steps/needs minimal assist    Standing Unsupported, One Foot in Front Able to take small step independently and hold 30 seconds    Standing on One Leg Able to lift leg independently and hold equal to or more than 3 seconds    Total Score 34    Berg comment: 34/56, high fall risk      Timed Up and Go Test   TUG Normal TUG    Normal TUG (seconds) 22             Gait:   Gait pattern: decreased step length- Right, decreased step length- Left, and knee flexed in stance- Left Distance walked: 230 ft Assistive device utilized: None Level of assistance: CGA Comments: gait with no AD    PATIENT EDUCATION: Education details: Continue HEP, results of OM and functional implications Person educated: Patient Education method: Explanation Education comprehension: verbalized understanding and needs further education  HOME EXERCISE PROGRAM: Access Code: [email or web address] URL:  [email or web address] Date: 09/26/2022 Prepared by: Peter Congo  Exercises - Ankle Dorsiflexion with Resistance  - 1 x daily - 7 x weekly - 3 sets - 10 reps - Gastroc Stretch on Wall  - 1 x daily - 7 x weekly - 3 sets - 10 reps - Supine Knee Extension Stretch on Towel Roll  - 1 x daily - 7 x weekly - Supine Quad Set  - 1 x daily - 7 x weekly - 3 sets - 10 reps - Sit to Stand with Arms Crossed  - 1 x daily - 7 x weekly - 3 sets - 10 reps - Supine Bridge  - 1 x daily - 7 x weekly - 3 sets - 10 reps   Access Code: [email or web address] - added 10-11-22 URL: [email or web address] Date: 10/12/2022 Prepared by: Maebelle Munroe  Exercises - Supine hamstring stretch  - 1 x daily - 7 x weekly - 1 sets -  2-3 reps - 60 sec hold  Access Code: [email or web address] URL: [email or web address] Date: 10/07/2022 Prepared by: Maebelle Munroe  Exercises - Single Leg Stance with Support  - 1 x daily - 7 x weekly - 1 sets - 2-3 reps - 10 sec hold   HEP issued: 10-04-22, updated 10-11-22 Access Code: [email or web address]:  added SLS exercise - Access Code: [email or web address] 10-06-22 URL: [email or web address] Date: 10/05/2022 Prepared by: Maebelle Munroe  Exercises - Alternating Step Forward with Support  - 1 x daily - 7 x weekly - 1 sets - 10 reps - Step Sideways  - 1 x daily - 7 x weekly - 1 sets - 10 reps - Alternating Step Backward with Support  - 1 x daily - 7 x weekly - 1 sets - 10 reps - Side Stepping with Counter Support  - 1 x daily - 7 x weekly - 1 sets - 2-3 reps - Standing March with Counter Support  - 1 x daily - 7 x weekly - 1 sets - 10 reps   GOALS: Goals reviewed with patient? Yes  SHORT TERM GOALS: Target date: 10-21-22  Improve Berg score by at least 4 points to reduce fall risk. Baseline: 30/56 (8/19), 34/56 (9/11) Goal status: MET  2.  Pt will ambulate 230' without device with CGA on flat, even surface. Baseline: 230 ft CGA no AD (9/11) Goal status: MET  3.  Report  decreased back pain to </= 3/10 intensity for increased ease & comfort with ADL's & mobility.  Baseline: 5/10, 6/10 (9/11) Goal status: NOT MET  4.  Improve 5x sit to stand score to </= 17 secs from chair without UE support to demo improved LE strength.  Baseline: 21.47 secs, 15.62 sec no UE (9/11) Goal status: MET  5.  Improve TUG score to </= 17.5 secs with RW to demo improved functional mobility.  Baseline: 21.47 secs with RW, 22 sec with RW (9/11) Goal status: NOT MET  6.  Increase gait speed to >/= 1.9 ft/sec with RW with pt demonstrating bil. Knee extension in stance.  Baseline: 1.52 ft/sec with RW (21.56 secs), 1.52 ft/sec with RW (9/11) Goal status: NOT MET   LONG TERM GOALS: Target date: 11-18-22  Improve Berg score by at least 8 points to reduce fall risk & demo improved balance. Baseline: 30/56 (8/19) Goal status: INITIAL  2.  Pt will be modified independent with household ambulation without device. Baseline:  Goal status: INITIAL  3.  Improve 5x sit to stand score to </= 14 secs from chair without UE support to demo improved LE strength.  Baseline: 21.47 secs Goal status: INITIAL  4.  Improve TUG score to </= 14.5 secs with RW to demo improved functional mobility.  Baseline: 21.47 secs with RW Goal status: INITIAL  5.   Increase gait speed to >/= 2.3 ft/sec with RW with pt demonstrating bil. Knee extension in stance.  Baseline: 1.52 ft/sec with RW (21.56 secs) Goal status: INITIAL  6.  Independent in HEP for balance and LE strengthening and core stabilization exercises. Baseline:  Goal status: INITIAL  ASSESSMENT:  CLINICAL IMPRESSION: Emphasis of skilled PT session on assessing STG. Pt has met 3/6 STG due to improving her balance and her functional LE strength as well as her ability to ambulate longer distances with decreased AD reliance. However, she showed no change in her gait speed or TUG score and continues to have significant back pain. Pt continues to  benefit from skilled therapy services to work towards  increased safety and independence with functional mobility and to progress towards increased independence with management of pain symptoms. Continue POC.    OBJECTIVE IMPAIRMENTS: Abnormal gait, decreased activity tolerance, decreased balance, decreased strength, and pain.   ACTIVITY LIMITATIONS: carrying, lifting, bending, squatting, stairs, transfers, reach over head, and locomotion level  PARTICIPATION LIMITATIONS: meal prep, cleaning, laundry, shopping, community activity, and pt reports she does not drive  PERSONAL FACTORS: Past/current experiences, Time since onset of injury/illness/exacerbation, and 1 comorbidity: MS and s/p back surgery  are also affecting patient's functional outcome.   REHAB POTENTIAL: Good  CLINICAL DECISION MAKING: Evolving/moderate complexity  EVALUATION COMPLEXITY: Moderate  PLAN:  PT FREQUENCY: 2x/week  PT DURATION: 8 weeks  PLANNED INTERVENTIONS: Therapeutic exercises, Therapeutic activity, Neuromuscular re-education, Balance training, Gait training, Patient/Family education, Self Care, Stair training, DME instructions, and Aquatic Therapy  PLAN FOR NEXT SESSION: 10th PN, Cont gait training, LE strengthening, endurance, balance on compliant surfaces, how was [place]?  Peter Congo, PT, DPT, CSRS  10/19/2022, 2:45 PM

## 2022-11-03 ENCOUNTER — Other Ambulatory Visit: Payer: PPO

## 2022-11-03 ENCOUNTER — Encounter: Payer: PPO | Admitting: Occupational Therapy

## 2022-11-03 ENCOUNTER — Ambulatory Visit: Payer: PPO | Admitting: Physical Therapy

## 2022-11-04 ENCOUNTER — Encounter: Payer: PPO | Admitting: Occupational Therapy

## 2022-11-04 ENCOUNTER — Ambulatory Visit: Payer: PPO | Admitting: Physical Therapy

## 2022-11-04 DIAGNOSIS — R5381 Other malaise: Secondary | ICD-10-CM | POA: Diagnosis not present

## 2022-11-07 ENCOUNTER — Ambulatory Visit: Payer: PPO | Admitting: Endocrinology

## 2022-11-07 DIAGNOSIS — R059 Cough, unspecified: Secondary | ICD-10-CM | POA: Diagnosis not present

## 2022-11-07 DIAGNOSIS — J029 Acute pharyngitis, unspecified: Secondary | ICD-10-CM | POA: Diagnosis not present

## 2022-11-07 DIAGNOSIS — U071 COVID-19: Secondary | ICD-10-CM | POA: Diagnosis not present

## 2022-11-08 ENCOUNTER — Ambulatory Visit: Payer: PPO | Admitting: Physical Therapy

## 2022-11-08 ENCOUNTER — Encounter: Payer: PPO | Admitting: Occupational Therapy

## 2022-11-10 ENCOUNTER — Ambulatory Visit: Payer: PPO | Admitting: Physical Therapy

## 2022-11-10 ENCOUNTER — Encounter: Payer: PPO | Admitting: Occupational Therapy

## 2022-11-11 ENCOUNTER — Other Ambulatory Visit: Payer: Self-pay | Admitting: Neurology

## 2022-11-11 DIAGNOSIS — Z1152 Encounter for screening for COVID-19: Secondary | ICD-10-CM | POA: Diagnosis not present

## 2022-11-11 DIAGNOSIS — U071 COVID-19: Secondary | ICD-10-CM | POA: Diagnosis not present

## 2022-11-14 ENCOUNTER — Ambulatory Visit: Payer: PPO | Attending: Physician Assistant | Admitting: Occupational Therapy

## 2022-11-14 ENCOUNTER — Ambulatory Visit: Payer: PPO | Admitting: Physical Therapy

## 2022-11-14 DIAGNOSIS — R2681 Unsteadiness on feet: Secondary | ICD-10-CM | POA: Insufficient documentation

## 2022-11-14 DIAGNOSIS — M25541 Pain in joints of right hand: Secondary | ICD-10-CM | POA: Diagnosis not present

## 2022-11-14 DIAGNOSIS — R269 Unspecified abnormalities of gait and mobility: Secondary | ICD-10-CM | POA: Insufficient documentation

## 2022-11-14 DIAGNOSIS — R251 Tremor, unspecified: Secondary | ICD-10-CM

## 2022-11-14 DIAGNOSIS — R278 Other lack of coordination: Secondary | ICD-10-CM

## 2022-11-14 DIAGNOSIS — R29818 Other symptoms and signs involving the nervous system: Secondary | ICD-10-CM

## 2022-11-14 DIAGNOSIS — G35 Multiple sclerosis: Secondary | ICD-10-CM

## 2022-11-14 DIAGNOSIS — R262 Difficulty in walking, not elsewhere classified: Secondary | ICD-10-CM | POA: Insufficient documentation

## 2022-11-14 DIAGNOSIS — M25542 Pain in joints of left hand: Secondary | ICD-10-CM

## 2022-11-14 DIAGNOSIS — R2689 Other abnormalities of gait and mobility: Secondary | ICD-10-CM | POA: Diagnosis not present

## 2022-11-14 DIAGNOSIS — R41841 Cognitive communication deficit: Secondary | ICD-10-CM | POA: Insufficient documentation

## 2022-11-14 DIAGNOSIS — M6281 Muscle weakness (generalized): Secondary | ICD-10-CM

## 2022-11-14 DIAGNOSIS — U071 COVID-19: Secondary | ICD-10-CM | POA: Diagnosis not present

## 2022-11-14 NOTE — Therapy (Addendum)
OUTPATIENT PHYSICAL THERAPY NEURO TREATMENT- 10th Visit Progress Note   Patient Name: Katrina Rivera MRN: [id number] DOB:07-Jan-1946, 77 y.o., female Today's Date: 11/14/2022   PCP: Noberto Retort, MD REFERRING PROVIDER: Charlton Amor, PA-C  Physical Therapy Progress Note   Dates of Reporting Period:09/20/22 - 11/14/22  See Note below for Objective Data and Assessment of Progress/Goals.  Thank you for the referral of this patient. Peter Congo, PT, DPT, CSRS   END OF SESSION:  PT End of Session - 11/14/22 1149     Visit Number 10    Number of Visits 17    Date for PT Re-Evaluation 11/18/22    Authorization Type [employer]    Progress Note Due on Visit 10    PT Start Time 1149    PT Stop Time 1232    PT Time Calculation (min) 43 min    Equipment Utilized During Treatment Gait belt    Activity Tolerance Patient tolerated treatment well;Patient limited by fatigue    Behavior During Therapy WFL for tasks assessed/performed                    Past Medical History:  Diagnosis Date   Addison's disease (HCC)    takes Solu Cortef daily   Anemia    takes Ferrous Sulfate daily   Anxiety    takes Xanax nightly   Arthritis    Chronic back pain    stenosis   Depression    takes Cymbalta daily   Dizziness    if b/p drops    Fibromyalgia    History of blood transfusion    no abnormal reaction noted   History of bronchitis    many yrs ago    Hypokalemia    takes Potassium daily   Hypotension    takes Florinef daily   IBS (irritable bowel syndrome)    takes Align daily   Insomnia    takes Trazodone nightly   Joint pain    Multiple sclerosis (HCC)    doesn't take any meds   Multiple sclerosis (HCC)    New onset headache 12/22/2021   Nocturia    Osteoporosis    Palpitations    Peripheral neuropathy    Primary localized osteoarthritis of left knee 08/11/2020   Restless leg syndrome    Seasonal allergies    takes Zyrtec daily;uses Flonase  daily as needed   Syncope    when missed methylprednisolone dose   Urinary frequency    takes Flomax daily   Weakness    numbness and tingling   Past Surgical History:  Procedure Laterality Date   ABDOMINAL HYSTERECTOMY  02/07/1986   APPENDECTOMY  02/07/1986   CESAREAN SECTION  1973/1977   x2   CHOLECYSTECTOMY  02/08/1995   COLECTOMY  02/08/1988   COLONOSCOPY     ESOPHAGOGASTRODUODENOSCOPY     EYE SURGERY     bilateral - /w IOL- cataracts   FRACTURE SURGERY Right    rods and screws-right leg   LUMBAR LAMINECTOMY/DECOMPRESSION MICRODISCECTOMY Left 01/24/2013   Procedure: LUMBAR FIVE TO SACRAL ONE LUMBAR LAMINECTOMY/DECOMPRESSION MICRODISCECTOMY 1 LEVEL;  Surgeon: Tia Alert, MD;  Location: [facility];  Service: Neurosurgery;  Laterality: Left;   MAXIMUM ACCESS (MAS)POSTERIOR LUMBAR INTERBODY FUSION (PLIF) 1 LEVEL N/A 06/18/2014   Procedure: MAXIMUM ACCESS SURGERY POSTERIOR LUMBAR INTERBODY FUSION LUMBAR FIVE TO SACRAL ONE ;  Surgeon: Tia Alert, MD;  Location: [facility];  Service: Neurosurgery;  Laterality: N/A;  SPINAL CORD STIMULATOR BATTERY EXCHANGE Right 07/30/2021   Procedure: Spinal cord stimulator battery replacement;  Surgeon: Tia Alert, MD;  Location: [facility];  Service: Neurosurgery;  Laterality: Right;   SPINAL CORD STIMULATOR IMPLANT     TOTAL KNEE ARTHROPLASTY Left 08/24/2020   Procedure: TOTAL KNEE ARTHROPLASTY;  Surgeon: Salvatore Marvel, MD;  Location: [facility];  Service: Orthopedics;  Laterality: Left;   Patient Active Problem List   Diagnosis Date Noted   Lumbar burst fracture (HCC) 08/23/2022   Lumbar vertebral fracture (HCC) 08/19/2022   Chronic migraine w/o aura w/o status migrainosus, not intractable 03/29/2022   Mixed hyperlipidemia 12/27/2021   Chest pain of uncertain etiology 12/27/2021   New onset headache 12/22/2021   S/P lumbar fusion 07/30/2021   Primary localized osteoarthritis of left knee 08/11/2020   Atrophic vaginitis 08/11/2020    Menopausal symptom 08/11/2020   Restless leg syndrome    Chronic back pain    Low back pain without sciatica 11/01/2018   Essential hypertension    SBO (small bowel obstruction) (HCC) 09/01/2016   Fibromyalgia    Abdominal pain, vomiting, and diarrhea    Dehydration    Somnolence, daytime 10/22/2015   Fatigue 10/22/2015   Chronic leg pain 06/29/2015   S/P lumbar spinal fusion 06/18/2014   Abnormality of gait 02/18/2013   S/P lumbar microdiscectomy 01/24/2013   Hypokalemia 12/24/2012   Sweating abnormality 10/19/2012   Dysautonomia orthostatic hypotension syndrome 08/16/2012   Hereditary and idiopathic peripheral neuropathy 08/16/2012   Chronic adrenal insufficiency (HCC) 08/16/2012   MS (multiple sclerosis) (HCC) 08/15/2012    ONSET DATE: 08-17-22  REFERRING DIAG: G35 (ICD-10-CM) - Multiple sclerosis   Diagnosis  S32.039D (ICD-10-CM) - Closed fracture of third lumbar vertebra with routine healing, unspecified fracture morphology, subsequent encounter    THERAPY DIAG:  Muscle weakness (generalized)  MS (multiple sclerosis) (HCC)  Other lack of coordination  Other symptoms and signs involving the nervous system  Difficulty in walking, not elsewhere classified  Unsteadiness on feet  Abnormality of gait  Rationale for Evaluation and Treatment: Rehabilitation  SUBJECTIVE:                                                                                                                                                                                             SUBJECTIVE STATEMENT:  Was not able to do HEP since last session, feels that she has been set back. Reported feeling dizzy a few times while walking, denies falls or acute changes. Had Covid recently, is wearing a mask. She made her walker taller.   Is having trigger point injections done tomorrow. Finds out  when can discontinue brace on 22nd.   "I can tell I needed this, its been a long time since I've been  here"  Pt accompanied by: self  PERTINENT HISTORY: spinal cord stimulator implant; MS diagnosed in 1984; s/p 2 back surgeries  Per Chart note "Brief HPI:   Katrina Rivera is a 77 y.o. female with a history of relapsing remitting multiple sclerosis who was evaluated by Dr. Yetta Barre regarding gait abnormality, chronic low back pain and status post lumbar fusion May 2024. She has reportedly fallen 4 times at home after PLIF of L3-5. She was admitted 08/17/2022 for scheduled surgery of L3 spinal fracture. She underwent posterior fixation L2-L4 using NuVasive cortical pedicle screws, arthrodesis and removal of segmental fixation L3-L5 by Dr. Yetta Barre. PT OT evaluations carried out. Incision is healed without signs of infection. She is appropriate back soreness. She has no complaints of leg pain or new numbness, tingling or weakness per Dr. Barnett Applebaum note today. Signs are stable and she is tolerating regular diet. Other past medical history includes restless leg syndrome, anxiety, headaches with migraine features, fibromyalgia, hypotension, anemia (acute on chronic)."    PAIN:  Are you having pain? Yes: NPRS scale: 5-6/10 Pain location: low back pain  Pain description: tightness, throbbing, sore Aggravating factors: no specific Relieving factors: Brace helps   PRECAUTIONS: Back  - No bending, lifting >10#, twisting, reaching overhead; Dr Yetta Barre responded with "regular back precautions" via inbasket message inquiring about precautions  RED FLAGS: None   WEIGHT BEARING RESTRICTIONS: No  FALLS: Has patient fallen in last 6 months? Yes. Number of falls 4  LIVING ENVIRONMENT: Lives with: lives with their spouse Lives in: House/apartment Stairs: Yes: External: 1 steps; planning on having rail installed Has following equipment at home: Walker - 2 wheeled, shower chair, and Grab bars  PLOF: Independent with household mobility with device, Requires assistive device for independence, and Needs assistance with  ADLs  PATIENT GOALS: "walk without walker and back brace and get back to where I was before"  OBJECTIVE:   DIAGNOSTIC FINDINGS: IMPRESSION: 1. Unhealed fracture of the posterosuperior L3 body, ans associated avulsed appearance of both L3 pedicles (series 7, image 30) along the course of the bilateral L3 pedicle screws. Mild retropulsion of the posterior body fragment does not result in significant spinal stenosis. 2. No other acute osseous abnormality identified in the Lumbar Spine. But scant arthrodesis limited to only the right L5-S1 posterior elements following L3 through S1 fusion. Some endplate subsidence at each level. 3.  Aortic Atherosclerosis (ICD10-I70.0).  Nephrolithiasis.  COGNITION: Overall cognitive status: Within functional limits for tasks assessed   SENSATION: Impaired sensation in arms and legs - numbness and tingling  COORDINATION: WFL's - slowed movement due to back pain  POSTURE: rounded shoulders and forward head  LOWER EXTREMITY ROM:   WFL's bil. LE's   LOWER EXTREMITY MMT:  grossly 4/5 throughout but pain with resistance due to back pain (surgical site)  BED MOBILITY:  Independent   TRANSFERS: Assistive device utilized: Environmental consultant - 4 wheeled  Sit to stand: Modified independence Stand to sit: Modified independence  STAIRS:  TBA  GAIT: Gait pattern:  increased knee flexion bil. LE's in stance  and step through pattern Distance walked: 3' Assistive device utilized: Environmental consultant - 2 wheeled Level of assistance: SBA Comments: pt amb. With bil. Knees flexed  FUNCTIONAL TESTS:  5 times sit to stand: 21.47 secs from chair without UE support with close CGA Timed up and go (TUG): 21.47  secs with RW 10 meter walk test: 21.56 = 1.52 ft/sec with RW Berg Balance Scale: to be assessed  - pt stood for 2" without UE support with SBA  PATIENT SURVEYS:  N/A - dx is MS  TODAY'S TREATMENT:                                                                                                                         TherEx In parallel bars for UE use for balance: Lateral stepping w/ close stand by assist Down and back w/ BUEs for balance Down and back w/ single UE for balance Down and back w/ no hands  Lateral stepping w/ BUEs on bar for balance w/ red band resistance at ankles x3 laps w/ close stand by assist when stepping to R, pt rotates hips to R, verbal and tactile cues to square hips to front, lead w/ heel Added to HEP, see bolded below  NMR Forwards stepping over 4" hurdles on blue mat step-to pattern w/ BUE support x2 laps down and back w/ RLE lead X2 laps down and back w/ LLE lead  Progressed to second lap only using fingertip support  On airex 30 sec stance w/ eyes open largely anterior posterior sway w/ ankle strategy   On airex 30 sec weight shifting L and R w/ eyes open Pt requires UE support to recover balance multiple times  On airex x10 alternating step-taps to 8" step w/ RUE support Pt reports activity was challenging    PATIENT EDUCATION:  Education details: Continue HEP w/ addition, safe set up for banded lateral steps at home, relaxing shoulders when walking and not bearing too much weight through arms Person educated: Patient Education method: Explanation, Demonstration, Tactile cues, Verbal cues, and Handouts Education comprehension: verbalized understanding, returned demonstration, verbal cues required, tactile cues required, and needs further education  HOME EXERCISE PROGRAM: Access Code: [email or web address] URL: [email or web address] Date: 09/26/2022 Prepared by: Peter Congo  Exercises - Ankle Dorsiflexion with Resistance  - 1 x daily - 7 x weekly - 3 sets - 10 reps - Gastroc Stretch on Wall  - 1 x daily - 7 x weekly - 3 sets - 10 reps - Supine Knee Extension Stretch on Towel Roll  - 1 x daily - 7 x weekly - Supine Quad Set  - 1 x daily - 7 x weekly - 3 sets - 10 reps - Sit to Stand with Arms Crossed  - 1 x  daily - 7 x weekly - 3 sets - 10 reps - Supine Bridge  - 1 x daily - 7 x weekly - 3 sets - 10 reps - Side Stepping with Resistance at Ankles and Counter Support  - 1 x daily - 7 x weekly - 3 sets - 10 reps   Access Code: [email or web address] - added 10-11-22 URL: [email or web address] Date: 10/12/2022 Prepared by: Maebelle Munroe  Exercises - Supine hamstring stretch  - 1 x daily - 7 x weekly - 1 sets -  2-3 reps - 60 sec hold  Access Code: [email or web address] URL: [email or web address] Date: 10/07/2022 Prepared by: Maebelle Munroe  Exercises - Single Leg Stance with Support  - 1 x daily - 7 x weekly - 1 sets - 2-3 reps - 10 sec hold   HEP issued: 10-04-22, updated 10-11-22 Access Code: [email or web address]:  added SLS exercise - Access Code: [email or web address] 10-06-22 URL: [email or web address] Date: 10/05/2022 Prepared by: Maebelle Munroe  Exercises - Alternating Step Forward with Support  - 1 x daily - 7 x weekly - 1 sets - 10 reps - Step Sideways  - 1 x daily - 7 x weekly - 1 sets - 10 reps - Alternating Step Backward with Support  - 1 x daily - 7 x weekly - 1 sets - 10 reps - Side Stepping with Counter Support  - 1 x daily - 7 x weekly - 1 sets - 2-3 reps - Standing March with Counter Support  - 1 x daily - 7 x weekly - 1 sets - 10 reps   GOALS: Goals reviewed with patient? Yes  SHORT TERM GOALS: Target date: 10-21-22  Improve Berg score by at least 4 points to reduce fall risk. Baseline: 30/56 (8/19), 34/56 (9/11) Goal status: MET  2.  Pt will ambulate 230' without device with CGA on flat, even surface. Baseline: 230 ft CGA no AD (9/11) Goal status: MET  3.  Report decreased back pain to </= 3/10 intensity for increased ease & comfort with ADL's & mobility.  Baseline: 5/10, 6/10 (9/11) Goal status: NOT MET  4.  Improve 5x sit to stand score to </= 17 secs from chair without UE support to demo improved LE strength.  Baseline: 21.47 secs, 15.62 sec no UE (9/11) Goal status:  MET  5.  Improve TUG score to </= 17.5 secs with RW to demo improved functional mobility.  Baseline: 21.47 secs with RW, 22 sec with RW (9/11) Goal status: NOT MET  6.  Increase gait speed to >/= 1.9 ft/sec with RW with pt demonstrating bil. Knee extension in stance.  Baseline: 1.52 ft/sec with RW (21.56 secs), 1.52 ft/sec with RW (9/11) Goal status: NOT MET   LONG TERM GOALS: Target date: 11-18-22  Improve Berg score by at least 8 points to reduce fall risk & demo improved balance. Baseline: 30/56 (8/19) Goal status: INITIAL  2.  Pt will be modified independent with household ambulation without device. Baseline:  Goal status: INITIAL  3.  Improve 5x sit to stand score to </= 14 secs from chair without UE support to demo improved LE strength.  Baseline: 21.47 secs Goal status: INITIAL  4.  Improve TUG score to </= 14.5 secs with RW to demo improved functional mobility.  Baseline: 21.47 secs with RW Goal status: INITIAL  5.   Increase gait speed to >/= 2.3 ft/sec with RW with pt demonstrating bil. Knee extension in stance.  Baseline: 1.52 ft/sec with RW (21.56 secs) Goal status: INITIAL  6.  Independent in HEP for balance and LE strengthening and core stabilization exercises. Baseline:  Goal status: INITIAL  ASSESSMENT:  CLINICAL IMPRESSION:  Emphasis of skilled PT session on lateral stepping, hip abduction strengthening, functional hip flexion and knee flexion, and single leg stance on compliant surfaces. Pt fatigued but reports good work out at end of session. Continue POC.    OBJECTIVE IMPAIRMENTS: Abnormal gait, decreased activity tolerance, decreased balance, decreased strength, and pain.   ACTIVITY LIMITATIONS: carrying, lifting, bending, squatting, stairs, transfers, reach over head,  and locomotion level  PARTICIPATION LIMITATIONS: meal prep, cleaning, laundry, shopping, community activity, and pt reports she does not drive  PERSONAL FACTORS: Past/current  experiences, Time since onset of injury/illness/exacerbation, and 1 comorbidity: MS and s/p back surgery  are also affecting patient's functional outcome.   REHAB POTENTIAL: Good  CLINICAL DECISION MAKING: Evolving/moderate complexity  EVALUATION COMPLEXITY: Moderate  PLAN:  PT FREQUENCY: 2x/week  PT DURATION: 8 weeks  PLANNED INTERVENTIONS: Therapeutic exercises, Therapeutic activity, Neuromuscular re-education, Balance training, Gait training, Patient/Family education, Self Care, Stair training, DME instructions, and Aquatic Therapy  PLAN FOR NEXT SESSION: Cont gait training, LE strengthening, endurance, balance on compliant surfaces, hip abduction and flexion strengthening, steps blaze-pods activity  Beverely Low, SPT  Peter Congo, PT, DPT, CSRS  11/14/2022, 1:04 PM

## 2022-11-14 NOTE — Therapy (Unsigned)
OUTPATIENT OCCUPATIONAL THERAPY NEURO TREATMENT  Patient Name: Katrina Rivera MRN: [id number] DOB:1945/12/18, 77 y.o., female Today's Date: 11/14/2022  PCP: Noberto Retort, MD  REFERRING PROVIDER: Charlton Amor, PA-C  END OF SESSION:  OT End of Session - 11/14/22 1103     Visit Number 6    Number of Visits 13    Date for OT Re-Evaluation 11/18/22    Authorization Type [employer] - follows Medicare guidelines    Progress Note Due on Visit 10    OT Start Time 1104    OT Stop Time 1145    OT Time Calculation (min) 41 min    Activity Tolerance Patient tolerated treatment well    Behavior During Therapy WFL for tasks assessed/performed            Past Medical History:  Diagnosis Date   Addison's disease (HCC)    takes Solu Cortef daily   Anemia    takes Ferrous Sulfate daily   Anxiety    takes Xanax nightly   Arthritis    Chronic back pain    stenosis   Depression    takes Cymbalta daily   Dizziness    if b/p drops    Fibromyalgia    History of blood transfusion    no abnormal reaction noted   History of bronchitis    many yrs ago    Hypokalemia    takes Potassium daily   Hypotension    takes Florinef daily   IBS (irritable bowel syndrome)    takes Align daily   Insomnia    takes Trazodone nightly   Joint pain    Multiple sclerosis (HCC)    doesn't take any meds   Multiple sclerosis (HCC)    New onset headache 12/22/2021   Nocturia    Osteoporosis    Palpitations    Peripheral neuropathy    Primary localized osteoarthritis of left knee 08/11/2020   Restless leg syndrome    Seasonal allergies    takes Zyrtec daily;uses Flonase daily as needed   Syncope    when missed methylprednisolone dose   Urinary frequency    takes Flomax daily   Weakness    numbness and tingling   Past Surgical History:  Procedure Laterality Date   ABDOMINAL HYSTERECTOMY  02/07/1986   APPENDECTOMY  02/07/1986   CESAREAN SECTION  1973/1977   x2    CHOLECYSTECTOMY  02/08/1995   COLECTOMY  02/08/1988   COLONOSCOPY     ESOPHAGOGASTRODUODENOSCOPY     EYE SURGERY     bilateral - /w IOL- cataracts   FRACTURE SURGERY Right    rods and screws-right leg   LUMBAR LAMINECTOMY/DECOMPRESSION MICRODISCECTOMY Left 01/24/2013   Procedure: LUMBAR FIVE TO SACRAL ONE LUMBAR LAMINECTOMY/DECOMPRESSION MICRODISCECTOMY 1 LEVEL;  Surgeon: Tia Alert, MD;  Location: [facility];  Service: Neurosurgery;  Laterality: Left;   MAXIMUM ACCESS (MAS)POSTERIOR LUMBAR INTERBODY FUSION (PLIF) 1 LEVEL N/A 06/18/2014   Procedure: MAXIMUM ACCESS SURGERY POSTERIOR LUMBAR INTERBODY FUSION LUMBAR FIVE TO SACRAL ONE ;  Surgeon: Tia Alert, MD;  Location: [facility];  Service: Neurosurgery;  Laterality: N/A;   SPINAL CORD STIMULATOR BATTERY EXCHANGE Right 07/30/2021   Procedure: Spinal cord stimulator battery replacement;  Surgeon: Tia Alert, MD;  Location: [facility] OR;  Service: Neurosurgery;  Laterality: Right;   SPINAL CORD STIMULATOR IMPLANT     TOTAL KNEE ARTHROPLASTY Left 08/24/2020   Procedure: TOTAL KNEE ARTHROPLASTY;  Surgeon: Salvatore Marvel, MD;  Location:  [facility];  Service: Orthopedics;  Laterality: Left;   Patient Active Problem List   Diagnosis Date Noted   Lumbar burst fracture (HCC) 08/23/2022   Lumbar vertebral fracture (HCC) 08/19/2022   Chronic migraine w/o aura w/o status migrainosus, not intractable 03/29/2022   Mixed hyperlipidemia 12/27/2021   Chest pain of uncertain etiology 12/27/2021   New onset headache 12/22/2021   S/P lumbar fusion 07/30/2021   Primary localized osteoarthritis of left knee 08/11/2020   Atrophic vaginitis 08/11/2020   Menopausal symptom 08/11/2020   Restless leg syndrome    Chronic back pain    Low back pain without sciatica 11/01/2018   Essential hypertension    SBO (small bowel obstruction) (HCC) 09/01/2016   Fibromyalgia    Abdominal pain, vomiting, and diarrhea    Dehydration    Somnolence, daytime 10/22/2015    Fatigue 10/22/2015   Chronic leg pain 06/29/2015   S/P lumbar spinal fusion 06/18/2014   Abnormality of gait 02/18/2013   S/P lumbar microdiscectomy 01/24/2013   Hypokalemia 12/24/2012   Sweating abnormality 10/19/2012   Dysautonomia orthostatic hypotension syndrome 08/16/2012   Hereditary and idiopathic peripheral neuropathy 08/16/2012   Chronic adrenal insufficiency (HCC) 08/16/2012   MS (multiple sclerosis) (HCC) 08/15/2012    ONSET DATE: 08/24/2022 (date of referral)  REFERRING DIAG: G35 (ICD-10-CM) - Multiple sclerosis  THERAPY DIAG:  Muscle weakness (generalized)  MS (multiple sclerosis) (HCC)  Other lack of coordination  Tremors of nervous system  Joint pain in both hands  Other symptoms and signs involving the nervous system  Rationale for Evaluation and Treatment: Rehabilitation  SUBJECTIVE:   SUBJECTIVE STATEMENT: She got some arthritis gloves and feels they have been helpful.   Pt accompanied by: self  PERTINENT HISTORY: MS, addison's disease, anemia, anxiety, arthritis, chronic back pain, depression, fibromyalgia, hypotension, IBS, peripheral neuropathy, syncope, spinal cord stimulator, and prior back surgery. Pt underwent fusion of L2-4 and removal of segmental fixation of L3-5 to manage L3 spinal fx sustained during a fall (7/10 date of surgery).   PRECAUTIONS: Fall and Other: Pt recently had back surgery, no precautions reported or listed; Brace for back to be donned when up and moving; no lifting over 10 lbs  - contacted ortho for clarification  WEIGHT BEARING RESTRICTIONS: No  PAIN:  Are you having pain? Yes: NPRS scale: 6/10 Pain location: 4- hands; worse in back Pain description: dull; achey Aggravating factors: "overdoing it" Relieving factors: rest; scheduled medication every 6 hours   FALLS: Has patient fallen in last 6 months? Yes. Number of falls 4 including fall with lumbar fracture while reaching for pencil on the floor.   LIVING  ENVIRONMENT: Lives with: lives with their spouse Lives in: House/apartment Stairs: Yes: External: 1 steps; none Has following equipment at home: Single point cane, Walker - 2 wheeled, Environmental consultant - 4 wheeled, Wheelchair (manual), shower chair, bed side commode, and toilet riser  PLOF: Independent; has not driven recently; retired from [employer]  PATIENT GOALS: Improve B hand function, especially R hand  OBJECTIVE:   HAND DOMINANCE: Right  ADLs: Overall ADLs: mostly mod I Eating: husband cuts food for her, which is baseline LB Dressing: min A for donning shoes Bathing: husband cleans back  Tub Shower transfers: min A  IADLs: Shopping: mod A Light housekeeping: doesn't vacuum at baseline; mod A  Community mobility: SBA Handwriting: 50% legible  MOBILITY STATUS: Needs Assist: SBA with RW  ACTIVITY TOLERANCE: Activity tolerance: fair  FUNCTIONAL OUTCOME MEASURES: PSFS:  Total score = sum  of the activity scores/number of activities Minimum detectable change (90%CI) for average score = 2 points Minimum detectable change (90%CI) for single activity score = 3 points  UPPER EXTREMITY ROM:    BUE WNL; however opposition B impaired  UPPER EXTREMITY MMT:     BUE elbow ext and flex WFL  HAND FUNCTION: Grip strength: Right: 34.6 lbs; Left: 22.4 lbs  COORDINATION: 9 Hole Peg test: Right: 40 sec; Left: 38 sec  SENSATION: Light touch: Impaired  and hx of neuropathy; mainly in R index finger  EDEMA: none reported or observed  MUSCLE TONE: WNL  COGNITION: Overall cognitive status: Within functional limits for tasks assessed  VISION: Subjective report: changes in acuities Baseline vision: Wears glasses for reading only and will be getting eye exam Visual history: glaucoma and cataracts  VISION ASSESSMENT: WFL - to read clock on wall  PERCEPTION: Impaired: proprioception  PRAXIS: WFL  OBSERVATIONS: Pt appears well kept. She has RW with walker bag. Requires SBA  for ambulation.    TODAY'S TREATMENT:                                                                                                                               OT educated pt on use of Paraffin as version of heat application for B hand pain and stiffness. Therapist applied wax and placed towel over top as pt was educated pt on general heat use as noted in pt instructions.   OT reviewed WARM  Dusting and pots and pans  PATIENT EDUCATION: Education details: See above Person educated: Patient Education method: Explanation, Demonstration, Verbal cues, and Handouts Education comprehension: verbalized understanding, returned demonstration, verbal cues required, and needs further education  HOME EXERCISE PROGRAM: 10/12/2022: Coordination and yellow putty HEPs 10/19/2022: WARM memory strategy  GOALS:  SHORT TERM GOALS: Target date: 10/18/2022    Patient will demonstrate independence with initial HEP. Baseline: Goal status: IN Progress  2.  Patient will independently verbalize at least 3 energy conservation principles in relation to ADLs to increase functional independence.  Baseline:  Goal status: IN Progress   LONG TERM GOALS: Target date: 11/01/2022    Patient will report at least two-point increase in average PSFS score or at least three-point increase in a single activity score indicating functionally significant improvement given minimum detectable change.  Baseline: 1.3 total score (See above for individual activity scores)  Goal status: INITIAL  2.  Patient will demonstrate at least 30 lbs L grip strength as needed to open jars and other containers.  Baseline: 22.4 lbs Goal status: IN Progress  3.  Patient will demonstrate improvement with nine-hole peg by at least 5 seconds bilaterally. Baseline: Right: 40 sec; Left: 38 sec Goal status: IN Progress  4.  Pt will report handwriting to be at least 75% of prior legibility/neatness. Baseline: 50% legibility Goal  status: IN Progress   ASSESSMENT:  CLINICAL IMPRESSION: *** PERFORMANCE DEFICITS: in functional skills including ADLs, IADLs,  coordination, sensation, strength, pain, Fine motor control, mobility, endurance, decreased knowledge of precautions, decreased knowledge of use of DME, and UE functional use.  IMPAIRMENTS: are limiting patient from ADLs and IADLs.   CO-MORBIDITIES: has co-morbidities such as recent spinal surgery  that affects occupational performance. Patient will benefit from skilled OT to address above impairments and improve overall function.  REHAB POTENTIAL: Fair given chronicity of impairments   PLAN:  OT FREQUENCY: 2x/week  OT DURATION: 6 weeks  PLANNED INTERVENTIONS: self care/ADL training, therapeutic exercise, therapeutic activity, neuromuscular re-education, manual therapy, functional mobility training, electrical stimulation, fluidotherapy, moist heat, patient/family education, energy conservation, DME and/or AE instructions, and Re-evaluation  RECOMMENDED OTHER SERVICES: none at this time  CONSULTED AND AGREED WITH PLAN OF CARE: Patient  PLAN FOR NEXT SESSION: handwriting; tremor reduction strategies; oval 8 splint  Review Coordination and putty HEPs (check understanding for goal progression)  Review, update and provide AE ideas for joint protection  Review memory Binder to collect HEP, resources etc  Check out paraffin for arthritis/hand pain (order on [employer]?)   Delana Meyer, OT 11/14/2022, 3:27 PM

## 2022-11-14 NOTE — Patient Instructions (Signed)

## 2022-11-16 ENCOUNTER — Encounter: Payer: PPO | Attending: Physical Medicine and Rehabilitation | Admitting: Physical Medicine and Rehabilitation

## 2022-11-16 ENCOUNTER — Ambulatory Visit: Payer: PPO | Admitting: Physical Therapy

## 2022-11-16 ENCOUNTER — Other Ambulatory Visit (INDEPENDENT_AMBULATORY_CARE_PROVIDER_SITE_OTHER): Payer: PPO

## 2022-11-16 ENCOUNTER — Telehealth: Payer: Self-pay | Admitting: Occupational Therapy

## 2022-11-16 ENCOUNTER — Encounter: Payer: Self-pay | Admitting: Physical Medicine and Rehabilitation

## 2022-11-16 ENCOUNTER — Encounter: Payer: Self-pay | Admitting: Physical Therapy

## 2022-11-16 ENCOUNTER — Ambulatory Visit: Payer: PPO | Admitting: Occupational Therapy

## 2022-11-16 VITALS — BP 147/80 | HR 95 | Ht 60.0 in | Wt 108.0 lb

## 2022-11-16 DIAGNOSIS — R2681 Unsteadiness on feet: Secondary | ICD-10-CM

## 2022-11-16 DIAGNOSIS — G894 Chronic pain syndrome: Secondary | ICD-10-CM

## 2022-11-16 DIAGNOSIS — R42 Dizziness and giddiness: Secondary | ICD-10-CM

## 2022-11-16 DIAGNOSIS — E213 Hyperparathyroidism, unspecified: Secondary | ICD-10-CM | POA: Diagnosis not present

## 2022-11-16 DIAGNOSIS — R29818 Other symptoms and signs involving the nervous system: Secondary | ICD-10-CM

## 2022-11-16 DIAGNOSIS — R278 Other lack of coordination: Secondary | ICD-10-CM

## 2022-11-16 DIAGNOSIS — R251 Tremor, unspecified: Secondary | ICD-10-CM

## 2022-11-16 DIAGNOSIS — M797 Fibromyalgia: Secondary | ICD-10-CM | POA: Diagnosis not present

## 2022-11-16 DIAGNOSIS — R269 Unspecified abnormalities of gait and mobility: Secondary | ICD-10-CM

## 2022-11-16 DIAGNOSIS — M7918 Myalgia, other site: Secondary | ICD-10-CM | POA: Insufficient documentation

## 2022-11-16 DIAGNOSIS — M6281 Muscle weakness (generalized): Secondary | ICD-10-CM

## 2022-11-16 DIAGNOSIS — R262 Difficulty in walking, not elsewhere classified: Secondary | ICD-10-CM

## 2022-11-16 DIAGNOSIS — Z981 Arthrodesis status: Secondary | ICD-10-CM

## 2022-11-16 DIAGNOSIS — G35 Multiple sclerosis: Secondary | ICD-10-CM

## 2022-11-16 DIAGNOSIS — R4189 Other symptoms and signs involving cognitive functions and awareness: Secondary | ICD-10-CM

## 2022-11-16 LAB — BASIC METABOLIC PANEL
BUN: 21 mg/dL (ref 6–23)
CO2: 31 meq/L (ref 19–32)
Calcium: 10.8 mg/dL — ABNORMAL HIGH (ref 8.4–10.5)
Chloride: 98 meq/L (ref 96–112)
Creatinine, Ser: 1.1 mg/dL (ref 0.40–1.20)
GFR: 49.28 mL/min — ABNORMAL LOW (ref >60.00)
Glucose, Bld: 103 mg/dL — ABNORMAL HIGH (ref 70–99)
Potassium: 4.4 meq/L (ref 3.5–5.1)
Sodium: 139 meq/L (ref 135–145)

## 2022-11-16 MED ORDER — LIDOCAINE HCL 1 % IJ SOLN
9.0000 mL | Freq: Once | INTRAMUSCULAR | Status: AC
Start: 2022-11-16 — End: 2022-11-16
  Administered 2022-11-16: 9 mL

## 2022-11-16 NOTE — Therapy (Signed)
OUTPATIENT OCCUPATIONAL THERAPY NEURO TREATMENT  Patient Name: Katrina Rivera MRN: [id number] DOB:03-31-1945, 77 y.o., female Today's Date: 11/16/2022  PCP: Noberto Retort, MD  REFERRING PROVIDER: Charlton Amor, PA-C  END OF SESSION:  OT End of Session - 11/16/22 1108     Visit Number 7    Number of Visits 25    Date for OT Re-Evaluation 12/30/22    Authorization Type [employer] - follows Medicare guidelines    Progress Note Due on Visit 10    OT Start Time 1107    OT Stop Time 1145    OT Time Calculation (min) 38 min    Activity Tolerance Patient tolerated treatment well    Behavior During Therapy WFL for tasks assessed/performed            Past Medical History:  Diagnosis Date   Addison's disease (HCC)    takes Solu Cortef daily   Anemia    takes Ferrous Sulfate daily   Anxiety    takes Xanax nightly   Arthritis    Chronic back pain    stenosis   Depression    takes Cymbalta daily   Dizziness    if b/p drops    Fibromyalgia    History of blood transfusion    no abnormal reaction noted   History of bronchitis    many yrs ago    Hypokalemia    takes Potassium daily   Hypotension    takes Florinef daily   IBS (irritable bowel syndrome)    takes Align daily   Insomnia    takes Trazodone nightly   Joint pain    Multiple sclerosis (HCC)    doesn't take any meds   Multiple sclerosis (HCC)    New onset headache 12/22/2021   Nocturia    Osteoporosis    Palpitations    Peripheral neuropathy    Primary localized osteoarthritis of left knee 08/11/2020   Restless leg syndrome    Seasonal allergies    takes Zyrtec daily;uses Flonase daily as needed   Syncope    when missed methylprednisolone dose   Urinary frequency    takes Flomax daily   Weakness    numbness and tingling   Past Surgical History:  Procedure Laterality Date   ABDOMINAL HYSTERECTOMY  02/07/1986   APPENDECTOMY  02/07/1986   CESAREAN SECTION  1973/1977   x2    CHOLECYSTECTOMY  02/08/1995   COLECTOMY  02/08/1988   COLONOSCOPY     ESOPHAGOGASTRODUODENOSCOPY     EYE SURGERY     bilateral - /w IOL- cataracts   FRACTURE SURGERY Right    rods and screws-right leg   LUMBAR LAMINECTOMY/DECOMPRESSION MICRODISCECTOMY Left 01/24/2013   Procedure: LUMBAR FIVE TO SACRAL ONE LUMBAR LAMINECTOMY/DECOMPRESSION MICRODISCECTOMY 1 LEVEL;  Surgeon: Tia Alert, MD;  Location: [facility];  Service: Neurosurgery;  Laterality: Left;   MAXIMUM ACCESS (MAS)POSTERIOR LUMBAR INTERBODY FUSION (PLIF) 1 LEVEL N/A 06/18/2014   Procedure: MAXIMUM ACCESS SURGERY POSTERIOR LUMBAR INTERBODY FUSION LUMBAR FIVE TO SACRAL ONE ;  Surgeon: Tia Alert, MD;  Location: [facility];  Service: Neurosurgery;  Laterality: N/A;   SPINAL CORD STIMULATOR BATTERY EXCHANGE Right 07/30/2021   Procedure: Spinal cord stimulator battery replacement;  Surgeon: Tia Alert, MD;  Location: [facility] OR;  Service: Neurosurgery;  Laterality: Right;   SPINAL CORD STIMULATOR IMPLANT     TOTAL KNEE ARTHROPLASTY Left 08/24/2020   Procedure: TOTAL KNEE ARTHROPLASTY;  Surgeon: Salvatore Marvel, MD;  Location:  [facility];  Service: Orthopedics;  Laterality: Left;   Patient Active Problem List   Diagnosis Date Noted   Myofascial pain dysfunction syndrome 11/16/2022   Vertigo 11/16/2022   Lumbar burst fracture (HCC) 08/23/2022   Lumbar vertebral fracture (HCC) 08/19/2022   Chronic migraine w/o aura w/o status migrainosus, not intractable 03/29/2022   Mixed hyperlipidemia 12/27/2021   Chest pain of uncertain etiology 12/27/2021   New onset headache 12/22/2021   S/P lumbar fusion 07/30/2021   Primary localized osteoarthritis of left knee 08/11/2020   Atrophic vaginitis 08/11/2020   Menopausal symptom 08/11/2020   Restless leg syndrome    Chronic back pain    Low back pain without sciatica 11/01/2018   Essential hypertension    SBO (small bowel obstruction) (HCC) 09/01/2016   Fibromyalgia    Abdominal pain,  vomiting, and diarrhea    Dehydration    Somnolence, daytime 10/22/2015   Fatigue 10/22/2015   Chronic leg pain 06/29/2015   S/P lumbar spinal fusion 06/18/2014   Abnormality of gait 02/18/2013   S/P lumbar microdiscectomy 01/24/2013   Hypokalemia 12/24/2012   Sweating abnormality 10/19/2012   Dysautonomia orthostatic hypotension syndrome 08/16/2012   Hereditary and idiopathic peripheral neuropathy 08/16/2012   Chronic adrenal insufficiency (HCC) 08/16/2012   MS (multiple sclerosis) (HCC) 08/15/2012    ONSET DATE: 08/24/2022 (date of referral)  REFERRING DIAG: G35 (ICD-10-CM) - Multiple sclerosis  THERAPY DIAG:  Muscle weakness (generalized)  MS (multiple sclerosis) (HCC)  Other lack of coordination  Other symptoms and signs involving the nervous system  Tremors of nervous system  Rationale for Evaluation and Treatment: Rehabilitation  SUBJECTIVE:   SUBJECTIVE STATEMENT: She feels she has a really hard time with word finding. She does not feel that her memory has improved and she is worried she might have dementia.   She was seen by Dr. Berline Chough today and had injections to her shoulders. She is a little extra sore from this.   Pt accompanied by: self  PERTINENT HISTORY: MS, addison's disease, anemia, anxiety, arthritis, chronic back pain, depression, fibromyalgia, hypotension, IBS, peripheral neuropathy, syncope, spinal cord stimulator, and prior back surgery. Pt underwent fusion of L2-4 and removal of segmental fixation of L3-5 to manage L3 spinal fx sustained during a fall (7/10 date of surgery).   PRECAUTIONS: Fall and Other: Pt recently had back surgery, no precautions reported or listed; Brace for back to be donned when up and moving; no lifting over 10 lbs  - contacted ortho for clarification  WEIGHT BEARING RESTRICTIONS: No  PAIN:  Are you having pain? Yes: NPRS scale: 6/10 Pain location: 4- hands; worse in back and shoulders Pain description: dull;  achey Aggravating factors: "overdoing it" Relieving factors: rest; scheduled medication every 6 hours   FALLS: Has patient fallen in last 6 months? Yes. Number of falls 4 including fall with lumbar fracture while reaching for pencil on the floor.   LIVING ENVIRONMENT: Lives with: lives with their spouse Lives in: House/apartment Stairs: Yes: External: 1 steps; none Has following equipment at home: Single point cane, Walker - 2 wheeled, Environmental consultant - 4 wheeled, Wheelchair (manual), shower chair, bed side commode, and toilet riser  PLOF: Independent; has not driven recently; retired from [employer]  PATIENT GOALS: Improve B hand function, especially R hand  OBJECTIVE:   HAND DOMINANCE: Right  ADLs: Overall ADLs: mostly mod I Eating: husband cuts food for her, which is baseline LB Dressing: min A for donning shoes Bathing: husband cleans back  Tub Shower  transfers: min A  IADLs: Shopping: mod A Light housekeeping: doesn't vacuum at baseline; mod A  Community mobility: SBA Handwriting: 50% legible  MOBILITY STATUS: Needs Assist: SBA with RW  ACTIVITY TOLERANCE: Activity tolerance: fair  FUNCTIONAL OUTCOME MEASURES: PSFS:  Total score = sum of the activity scores/number of activities Minimum detectable change (90%CI) for average score = 2 points Minimum detectable change (90%CI) for single activity score = 3 points  UPPER EXTREMITY ROM:    BUE WNL; however opposition B impaired  UPPER EXTREMITY MMT:     BUE elbow ext and flex WFL  HAND FUNCTION: Grip strength: Right: 34.6 lbs; Left: 22.4 lbs  COORDINATION: 9 Hole Peg test: Right: 40 sec; Left: 38 sec  SENSATION: Light touch: Impaired  and hx of neuropathy; mainly in R index finger  EDEMA: none reported or observed  MUSCLE TONE: WNL  COGNITION: Overall cognitive status: Within functional limits for tasks assessed  VISION: Subjective report: changes in acuities Baseline vision: Wears glasses for reading  only and will be getting eye exam Visual history: glaucoma and cataracts  VISION ASSESSMENT: WFL - to read clock on wall  PERCEPTION: Impaired: proprioception  PRAXIS: WFL  OBSERVATIONS: Pt appears well kept. She has RW with walker bag. Requires SBA for ambulation.    TODAY'S TREATMENT:                                                                                                                               OT educated pt on use of adapted writing utensils. Pt showed preference for rubber, tripod gripper as well as pen again. Therapist provided lined, tracing paper for printed and cursive letters for home practice.   OT placed size 6 Oval 8 over R index DIP to promote extension. 1" Coban wrapped loosely over splint and PIP to limit hyper extension.   To promote better joint alignment and use, pt stacked 6, large wooden cubes using R pincer grasp with cues to grab from sides of block to better positioning.   OT reviewed putty HEP with pt requiring only min VC for proper execution.   OT explained progression towards goals and extension of services.   PATIENT EDUCATION: Education details: See above Person educated: Patient Education method: Explanation, Demonstration, and Verbal cues Education comprehension: verbalized understanding, returned demonstration, verbal cues required, and needs further education  HOME EXERCISE PROGRAM: 10/12/2022: Coordination and yellow putty HEPs 10/19/2022: WARM memory strategy  GOALS:  SHORT TERM GOALS: Target date: 10/18/2022    Patient will demonstrate independence with initial HEP. Baseline: Goal status: MET  2.  Patient will independently verbalize at least 3 energy conservation principles in relation to ADLs to increase functional independence.  Baseline:  Goal status: IN Progress   LONG TERM GOALS: Target date: 12/30/2022    Patient will report at least two-point increase in average PSFS score or at least three-point increase in a  single activity score indicating functionally significant improvement  given minimum detectable change.  Baseline: 1.3 total score (See above for individual activity scores)  Goal status: IN PROGRESS  2.  Patient will demonstrate at least 30 lbs L grip strength as needed to open jars and other containers.  Baseline: 22.4 lbs Goal status: IN Progress  3.  Patient will demonstrate improvement with nine-hole peg by at least 5 seconds bilaterally. Baseline: Right: 40 sec; Left: 38 sec Goal status: IN Progress  4.  Pt will report handwriting to be at least 75% of prior legibility/neatness. Baseline: 50% legibility Goal status: IN Progress   ASSESSMENT:  CLINICAL IMPRESSION: As pt requires repeat education due to cognition without signs of improvement, recommend ST referral for further assessment and treatment. Also recommending that pt be extended this date as needed to progress toward remaining goals.  PERFORMANCE DEFICITS: in functional skills including ADLs, IADLs, coordination, sensation, strength, pain, Fine motor control, mobility, endurance, decreased knowledge of precautions, decreased knowledge of use of DME, and UE functional use.  IMPAIRMENTS: are limiting patient from ADLs and IADLs.   CO-MORBIDITIES: has co-morbidities such as recent spinal surgery  that affects occupational performance. Patient will benefit from skilled OT to address above impairments and improve overall function.  REHAB POTENTIAL: Fair given chronicity of impairments   PLAN:  OT FREQUENCY: additional 2x/week  OT DURATION: additional 6 weeks  PLANNED INTERVENTIONS: self care/ADL training, therapeutic exercise, therapeutic activity, neuromuscular re-education, manual therapy, functional mobility training, electrical stimulation, fluidotherapy, moist heat, patient/family education, energy conservation, DME and/or AE instructions, and Re-evaluation  RECOMMENDED OTHER SERVICES: ST referral  CONSULTED AND  AGREED WITH PLAN OF CARE: Patient  PLAN FOR NEXT SESSION: tremor reduction strategies; oval 8 splint (how is it going?)  Review, update and provide AE ideas for joint protection  Review memory Binder to collect HEP, resources etc  Check out paraffin for arthritis/hand pain (order on [employer]?)   Delana Meyer, OT 11/16/2022, 12:58 PM

## 2022-11-16 NOTE — Telephone Encounter (Signed)
Dr. Berline Chough, I think this patient would benefit from ST evaluation for assessment of cognition due to impairments I am observing during her therapy treatments.    If you agree, please place an order in [facility] workque in [facility] or fax the order to [phone]. Thank you,  Shelbie Proctor, OTR/L 11/16/2022 Occupational Therapy [facility] Phone: [phone] Fax: [phone]   [facility] [address] [place], [place]  [address] Phone:  [phone] Fax:  [phone]

## 2022-11-16 NOTE — Progress Notes (Signed)
Pt is a 77 yr old female with hx of L3 fx with L2-L4 fixation and removal of prior hardware- recent fusion prior to L3 fx; with hx of relapsing remitting MS; Migraines on CGRP injections and Nurtec; Hypotension; IBS and Neurogenic bladder due to MS as well as Addison's disease on medrol daily. Did trigger point injections in hospital for pain and vertigo- surgery done in 08/2022.    Patient is here for  f/u on back L3 fx as well as trigger point injections.   Still walking with RW-  using corset brace- using brace except when lays down.    Needs trigger point injections "everywhere".  Having some dizziness and nausea. That's come back- came back 1 week ago.   Went on vacation- and then diverted to [place], [place] when on plane.  Legs swelled up a lot when on plane.  Got last flight from [place] to [facility] get rid of pain  Tried multiple different pain meds - taking Oxycodone  right now- used to take Norco-  doesn't get any results from pain meds Takes 1 and then tylenol-  takes ~1-2x/day.  Doesn't feel ANY easing off of pain.    Pain is down below incision- on sides below incision that has pain.  Surgeon is Dr Yetta Barre.   Takes Gabapentin 300 mg QAM and 900 mg at bedtime Still having a lot of nerve pain.  LLE feels like two tons picking it up and hurts- burning pain- half of leg is so heavy Still taking Duloxetine 60 mg daily for nerve pain as well.   Has stimulator- has to turn off at night- due to restless leg syndrome.   Dr Terrace Arabia prescribed Zanaflex- 4 mg at bedtime- for muscle spasms- thinks helps spasms a little.      Plan Pain meds via NSU- on Oxycodone-   2. Patient here for trigger point injections for dizziness/vertigo as well as pain control.   Consent done and on chart.  Cleaned areas with alcohol and injected using a 27 gauge 1.5 inch needle  Injected 7.5cc- wasted 1.5cc Using 1% Lidocaine with no EPI  Upper traps B/L x2 Levators- b/L  Posterior  scalenes Middle scalenes- b/l x2 Splenius Capitus- b/L  Pectoralis Major Rhomboids- B/L x3 Infraspinatus Teres Major/minor Thoracic paraspinals B/L x3 Lumbar paraspinals avoided stimulator- did L side only-  Other injections-    Patient's level of pain prior was 6/10 Current level of pain after injections is down to 4/10  There was no bleeding or complications.  Patient was advised to drink a lot of water on day after injections to flush system Will have increased soreness for 12-48 hours after injections.  Can use Lidocaine patches the day AFTER injections Can use theracane on day of injections in places didn't inject Can use heating pad 4-6 hours AFTER injections  3. Con't Duloxetine and Gabapentin- not written by me.   4. Feels better after injections- for vertigo.    5. F/U in  6 weeks- has appt 11/22 Make 2-3 appointments to stay ahead-   6. Acute rehab unit- therapists and nursing [address]  [facility]- 4th floor- acute rehab unit [place], [place] [address]  7.  Max 3 lidocaine patches at a time- but can use for pain.   I spent a total of  25  minutes on total care today- >50% coordination of care- due to 10 minutes on injections- rest spent discussing pain control- and how to manage-  and discussed ROM as well.

## 2022-11-16 NOTE — Therapy (Signed)
OUTPATIENT PHYSICAL THERAPY NEURO TREATMENT   Patient Name: Katrina Rivera MRN: [id number] DOB:[birth year], 77 y.o., female Today's Date: 11/16/2022   PCP: Noberto Retort, MD REFERRING PROVIDER: Charlton Amor, PA-C  END OF SESSION:   11/16/22 1148  PT Visits / Re-Eval  Visit Number 11  Number of Visits 17  Date for PT Re-Evaluation 11/18/22  Authorization  Authorization Type [employer]  Progress Note Due on Visit 10  PT Time Calculation  PT Start Time 1149 (handoff from OT)  PT Stop Time 1230  PT Time Calculation (min) 41 min  PT - End of Session  Equipment Utilized During Treatment Gait belt  Activity Tolerance Patient tolerated treatment well;Patient limited by fatigue  Behavior During Therapy WFL for tasks assessed/performed   Past Medical History:  Diagnosis Date   Addison's disease (HCC)    takes Solu Cortef daily   Anemia    takes Ferrous Sulfate daily   Anxiety    takes Xanax nightly   Arthritis    Chronic back pain    stenosis   Depression    takes Cymbalta daily   Dizziness    if b/p drops    Fibromyalgia    History of blood transfusion    no abnormal reaction noted   History of bronchitis    many yrs ago    Hypokalemia    takes Potassium daily   Hypotension    takes Florinef daily   IBS (irritable bowel syndrome)    takes Align daily   Insomnia    takes Trazodone nightly   Joint pain    Multiple sclerosis (HCC)    doesn't take any meds   Multiple sclerosis (HCC)    New onset headache 12/22/2021   Nocturia    Osteoporosis    Palpitations    Peripheral neuropathy    Primary localized osteoarthritis of left knee 08/11/2020   Restless leg syndrome    Seasonal allergies    takes Zyrtec daily;uses Flonase daily as needed   Syncope    when missed methylprednisolone dose   Urinary frequency    takes Flomax daily   Weakness    numbness and tingling   Past Surgical History:  Procedure Laterality Date   ABDOMINAL HYSTERECTOMY   02/07/1986   APPENDECTOMY  02/07/1986   CESAREAN SECTION  1973/1977   x2   CHOLECYSTECTOMY  02/08/1995   COLECTOMY  02/08/1988   COLONOSCOPY     ESOPHAGOGASTRODUODENOSCOPY     EYE SURGERY     bilateral - /w IOL- cataracts   FRACTURE SURGERY Right    rods and screws-right leg   LUMBAR LAMINECTOMY/DECOMPRESSION MICRODISCECTOMY Left 01/24/2013   Procedure: LUMBAR FIVE TO SACRAL ONE LUMBAR LAMINECTOMY/DECOMPRESSION MICRODISCECTOMY 1 LEVEL;  Surgeon: Tia Alert, MD;  Location: [facility];  Service: Neurosurgery;  Laterality: Left;   MAXIMUM ACCESS (MAS)POSTERIOR LUMBAR INTERBODY FUSION (PLIF) 1 LEVEL N/A 06/18/2014   Procedure: MAXIMUM ACCESS SURGERY POSTERIOR LUMBAR INTERBODY FUSION LUMBAR FIVE TO SACRAL ONE ;  Surgeon: Tia Alert, MD;  Location: [facility];  Service: Neurosurgery;  Laterality: N/A;   SPINAL CORD STIMULATOR BATTERY EXCHANGE Right 07/30/2021   Procedure: Spinal cord stimulator battery replacement;  Surgeon: Tia Alert, MD;  Location: [facility] OR;  Service: Neurosurgery;  Laterality: Right;   SPINAL CORD STIMULATOR IMPLANT     TOTAL KNEE ARTHROPLASTY Left 08/24/2020   Procedure: TOTAL KNEE ARTHROPLASTY;  Surgeon: Salvatore Marvel, MD;  Location: [facility] ORS;  Service: Orthopedics;  Laterality:  Left;   Patient Active Problem List   Diagnosis Date Noted   Myofascial pain dysfunction syndrome 11/16/2022   Vertigo 11/16/2022   Lumbar burst fracture (HCC) 08/23/2022   Lumbar vertebral fracture (HCC) 08/19/2022   Chronic migraine w/o aura w/o status migrainosus, not intractable 03/29/2022   Mixed hyperlipidemia 12/27/2021   Chest pain of uncertain etiology 12/27/2021   New onset headache 12/22/2021   S/P lumbar fusion 07/30/2021   Primary localized osteoarthritis of left knee 08/11/2020   Atrophic vaginitis 08/11/2020   Menopausal symptom 08/11/2020   Restless leg syndrome    Chronic back pain    Low back pain without sciatica 11/01/2018   Essential hypertension    SBO  (small bowel obstruction) (HCC) 09/01/2016   Fibromyalgia    Abdominal pain, vomiting, and diarrhea    Dehydration    Somnolence, daytime 10/22/2015   Fatigue 10/22/2015   Chronic leg pain 06/29/2015   S/P lumbar spinal fusion 06/18/2014   Abnormality of gait 02/18/2013   S/P lumbar microdiscectomy 01/24/2013   Hypokalemia 12/24/2012   Sweating abnormality 10/19/2012   Dysautonomia orthostatic hypotension syndrome 08/16/2012   Hereditary and idiopathic peripheral neuropathy 08/16/2012   Chronic adrenal insufficiency (HCC) 08/16/2012   MS (multiple sclerosis) (HCC) 08/15/2012    ONSET DATE: 08-17-22  REFERRING DIAG: G35 (ICD-10-CM) - Multiple sclerosis   Diagnosis  S32.039D (ICD-10-CM) - Closed fracture of third lumbar vertebra with routine healing, unspecified fracture morphology, subsequent encounter    THERAPY DIAG:  Muscle weakness (generalized)  Other lack of coordination  Other symptoms and signs involving the nervous system  Difficulty in walking, not elsewhere classified  Unsteadiness on feet  Abnormality of gait  Rationale for Evaluation and Treatment: Rehabilitation  SUBJECTIVE:                                                                                                                                                                                             SUBJECTIVE STATEMENT:  She used her seated stepper for 15 minutes yesterday and that made her sore, but otherwise has not done her HEP recently.  She denies falls or near falls.  Pt accompanied by: self  PERTINENT HISTORY: spinal cord stimulator implant; MS diagnosed in 1984; s/p 2 back surgeries  Per Chart note "Brief HPI:   Katrina Rivera is a 77 y.o. female with a history of relapsing remitting multiple sclerosis who was evaluated by Dr. Yetta Barre regarding gait abnormality, chronic low back pain and status post lumbar fusion May 2024. She has reportedly fallen 4 times at home after PLIF of L3-5. She  was admitted 08/17/2022 for scheduled  surgery of L3 spinal fracture. She underwent posterior fixation L2-L4 using NuVasive cortical pedicle screws, arthrodesis and removal of segmental fixation L3-L5 by Dr. Yetta Barre. PT OT evaluations carried out. Incision is healed without signs of infection. She is appropriate back soreness. She has no complaints of leg pain or new numbness, tingling or weakness per Dr. Barnett Applebaum note today. Signs are stable and she is tolerating regular diet. Other past medical history includes restless leg syndrome, anxiety, headaches with migraine features, fibromyalgia, hypotension, anemia (acute on chronic)."    PAIN:  Are you having pain? Yes: NPRS scale: 5/10 Pain location: low back pain and bilateral shoulders - recent TP injections (9am 10/9)  Pain description: tightness, throbbing, sore Aggravating factors: no specific Relieving factors: Brace helps   PRECAUTIONS: Back  - No bending, lifting >10#, twisting, reaching overhead; Dr Yetta Barre responded with "regular back precautions" via inbasket message inquiring about precautions  RED FLAGS: None   WEIGHT BEARING RESTRICTIONS: No  FALLS: Has patient fallen in last 6 months? Yes. Number of falls 4  LIVING ENVIRONMENT: Lives with: lives with their spouse Lives in: House/apartment Stairs: Yes: External: 1 steps; planning on having rail installed Has following equipment at home: Walker - 2 wheeled, shower chair, and Grab bars  PLOF: Independent with household mobility with device, Requires assistive device for independence, and Needs assistance with ADLs  PATIENT GOALS: "walk without walker and back brace and get back to where I was before"  OBJECTIVE:   DIAGNOSTIC FINDINGS: IMPRESSION: 1. Unhealed fracture of the posterosuperior L3 body, ans associated avulsed appearance of both L3 pedicles (series 7, image 30) along the course of the bilateral L3 pedicle screws. Mild retropulsion of the posterior body fragment does  not result in significant spinal stenosis. 2. No other acute osseous abnormality identified in the Lumbar Spine. But scant arthrodesis limited to only the right L5-S1 posterior elements following L3 through S1 fusion. Some endplate subsidence at each level. 3.  Aortic Atherosclerosis (ICD10-I70.0).  Nephrolithiasis.  COGNITION: Overall cognitive status: Within functional limits for tasks assessed   SENSATION: Impaired sensation in arms and legs - numbness and tingling  COORDINATION: WFL's - slowed movement due to back pain  POSTURE: rounded shoulders and forward head  LOWER EXTREMITY ROM:   WFL's bil. LE's   LOWER EXTREMITY MMT:  grossly 4/5 throughout but pain with resistance due to back pain (surgical site)  BED MOBILITY:  Independent   TRANSFERS: Assistive device utilized: Environmental consultant - 4 wheeled  Sit to stand: Modified independence Stand to sit: Modified independence  STAIRS:  TBA  GAIT: Gait pattern:  increased knee flexion bil. LE's in stance  and step through pattern Distance walked: 38' Assistive device utilized: Environmental consultant - 2 wheeled Level of assistance: SBA Comments: pt amb. With bil. Knees flexed  FUNCTIONAL TESTS:  5 times sit to stand: 21.47 secs from chair without UE support with close CGA Timed up and go (TUG): 21.47 secs with RW 10 meter walk test: 21.56 = 1.52 ft/sec with RW Berg Balance Scale: to be assessed  - pt stood for 2" without UE support with SBA  PATIENT SURVEYS:  N/A - dx is MS  TODAY'S TREATMENT:  TherEx Walking warmup x5 minutes (total moving time) 460 ft w/ RW SBA, cues for obstacle management particularly on the left, several standing rest breaks of varying length  NMR 6 Blaze pods on random one color taps setting for improved SLS and improved LE ROM/strength.  Performed on 1 minute intervals with 30 second-2 minute rest  periods.  Pt requires SBA-CGA guarding. Round 1:  Firm ground w/ pods on first 2 steps using BUE support setup.  26 hits. Round 2:  Progressed to unilateral UE support w/ intermittent bilateral touch w/ LOB.  32 hits. Round 3:  " setup.  40 hits. Notable errors/deficits:  Pt fatigues quickly and needs intermittent cues to reset posture. 4" hurdle advance retreat alternating LE forward 3x10 4" hurdle advance retreat laterally 2x10 each LE  PATIENT EDUCATION:  Education details: Continue HEP w/ addition, safe set up for banded lateral steps at home, relaxing shoulders when walking and not bearing too much weight through arms Person educated: Patient Education method: Explanation, Demonstration, Tactile cues, Verbal cues, and Handouts Education comprehension: verbalized understanding, returned demonstration, verbal cues required, tactile cues required, and needs further education  HOME EXERCISE PROGRAM: Access Code: [email or web address] URL: [email or web address] Date: 09/26/2022 Prepared by: Peter Congo  Exercises - Ankle Dorsiflexion with Resistance  - 1 x daily - 7 x weekly - 3 sets - 10 reps - Gastroc Stretch on Wall  - 1 x daily - 7 x weekly - 3 sets - 10 reps - Supine Knee Extension Stretch on Towel Roll  - 1 x daily - 7 x weekly - Supine Quad Set  - 1 x daily - 7 x weekly - 3 sets - 10 reps - Sit to Stand with Arms Crossed  - 1 x daily - 7 x weekly - 3 sets - 10 reps - Supine Bridge  - 1 x daily - 7 x weekly - 3 sets - 10 reps - Side Stepping with Resistance at Ankles and Counter Support  - 1 x daily - 7 x weekly - 3 sets - 10 reps   Access Code: [email or web address] - added 10-11-22 URL: [email or web address] Date: 10/12/2022 Prepared by: Maebelle Munroe  Exercises - Supine hamstring stretch  - 1 x daily - 7 x weekly - 1 sets - 2-3 reps - 60 sec hold  Access Code: [email or web address] URL: [email or web address] Date: 10/07/2022 Prepared by: Maebelle Munroe  Exercises - Single Leg Stance with Support  - 1 x daily - 7 x weekly - 1 sets - 2-3 reps - 10 sec hold   HEP issued: 10-04-22, updated 10-11-22 Access Code: [email or web address]:  added SLS exercise - Access Code: [email or web address] 10-06-22 URL: [email or web address] Date: 10/05/2022 Prepared by: Maebelle Munroe  Exercises - Alternating Step Forward with Support  - 1 x daily - 7 x weekly - 1 sets - 10 reps - Step Sideways  - 1 x daily - 7 x weekly - 1 sets - 10 reps - Alternating Step Backward with Support  - 1 x daily - 7 x weekly - 1 sets - 10 reps - Side Stepping with Counter Support  - 1 x daily - 7 x weekly - 1 sets - 2-3 reps - Standing March with Counter Support  - 1 x daily - 7 x weekly - 1 sets - 10 reps   GOALS: Goals reviewed with patient? Yes  SHORT TERM GOALS: Target date: 10-21-22  Improve Berg score by at least 4 points to reduce fall risk. Baseline: 30/56 (8/19), 34/56 (9/11) Goal  status: MET  2.  Pt will ambulate 230' without device with CGA on flat, even surface. Baseline: 230 ft CGA no AD (9/11) Goal status: MET  3.  Report decreased back pain to </= 3/10 intensity for increased ease & comfort with ADL's & mobility.  Baseline: 5/10, 6/10 (9/11) Goal status: NOT MET  4.  Improve 5x sit to stand score to </= 17 secs from chair without UE support to demo improved LE strength.  Baseline: 21.47 secs, 15.62 sec no UE (9/11) Goal status: MET  5.  Improve TUG score to </= 17.5 secs with RW to demo improved functional mobility.  Baseline: 21.47 secs with RW, 22 sec with RW (9/11) Goal status: NOT MET  6.  Increase gait speed to >/= 1.9 ft/sec with RW with pt demonstrating bil. Knee extension in stance.  Baseline: 1.52 ft/sec with RW (21.56 secs), 1.52 ft/sec with RW (9/11) Goal status: NOT MET   LONG TERM GOALS: Target date: 11-18-22  Improve Berg score by at least 8 points to reduce fall risk & demo improved balance. Baseline: 30/56 (8/19) Goal status:  INITIAL  2.  Pt will be modified independent with household ambulation without device. Baseline:  Goal status: INITIAL  3.  Improve 5x sit to stand score to </= 14 secs from chair without UE support to demo improved LE strength.  Baseline: 21.47 secs Goal status: INITIAL  4.  Improve TUG score to </= 14.5 secs with RW to demo improved functional mobility.  Baseline: 21.47 secs with RW Goal status: INITIAL  5.   Increase gait speed to >/= 2.3 ft/sec with RW with pt demonstrating bil. Knee extension in stance.  Baseline: 1.52 ft/sec with RW (21.56 secs) Goal status: INITIAL  6.  Independent in HEP for balance and LE strengthening and core stabilization exercises. Baseline:  Goal status: INITIAL  ASSESSMENT:  CLINICAL IMPRESSION:  Patient continues to demonstrate postural compensation during dynamic and ambulatory tasks completed today.  She tolerates all activities well without frequent instability, but remains somewhat limited by rapid fatigue requiring prolonged rest.  She continues to benefit from skilled PT to further address this as well as lingering deficits outlined in ongoing POC.  OBJECTIVE IMPAIRMENTS: Abnormal gait, decreased activity tolerance, decreased balance, decreased strength, and pain.   ACTIVITY LIMITATIONS: carrying, lifting, bending, squatting, stairs, transfers, reach over head, and locomotion level  PARTICIPATION LIMITATIONS: meal prep, cleaning, laundry, shopping, community activity, and pt reports she does not drive  PERSONAL FACTORS: Past/current experiences, Time since onset of injury/illness/exacerbation, and 1 comorbidity: MS and s/p back surgery  are also affecting patient's functional outcome.   REHAB POTENTIAL: Good  CLINICAL DECISION MAKING: Evolving/moderate complexity  EVALUATION COMPLEXITY: Moderate  PLAN:  PT FREQUENCY: 2x/week  PT DURATION: 8 weeks  PLANNED INTERVENTIONS: Therapeutic exercises, Therapeutic activity, Neuromuscular  re-education, Balance training, Gait training, Patient/Family education, Self Care, Stair training, DME instructions, and Aquatic Therapy  PLAN FOR NEXT SESSION: Cont gait training, LE strengthening, endurance, balance on compliant surfaces, hip abduction and flexion strengthening, compliant surface blaze-pods activity; PT 10/9 had pt schedule 3 additional visits she had to previously cancel - she is requesting to make these up and OT is extending, did inform her that more familiar PT will make that call!  Camille Bal, PT, DPT  11/16/2022, 11:52 AM

## 2022-11-16 NOTE — Patient Instructions (Addendum)
Plan Pain meds via NSU- on Oxycodone-   2. Patient here for trigger point injections for dizziness/vertigo as well as pain control.   Consent done and on chart.  Cleaned areas with alcohol and injected using a 27 gauge 1.5 inch needle  Injected 7.5cc- wasted 1.5cc Using 1% Lidocaine with no EPI  Upper traps B/L x2 Levators- b/L  Posterior scalenes Middle scalenes- b/l x2 Splenius Capitus- b/L  Pectoralis Major Rhomboids- B/L x3 Infraspinatus Teres Major/minor Thoracic paraspinals B/L x3 Lumbar paraspinals avoided stimulator- did L side only-  Other injections-    Patient's level of pain prior was 6/10 Current level of pain after injections is down to 4/10  There was no bleeding or complications.  Patient was advised to drink a lot of water on day after injections to flush system Will have increased soreness for 12-48 hours after injections.  Can use Lidocaine patches the day AFTER injections Can use theracane on day of injections in places didn't inject Can use heating pad 4-6 hours AFTER injections  3. Con't Duloxetine and Gabapentin- not written by me.   4. Feels better after injections- for vertigo.    5. F/U in  6 weeks- has appt 11/22 Make 2-3 appointments to stay ahead-   6. Acute rehab unit- therapists and nursing [address]  [facility]- 4th floor- acute rehab unit [place], [place] [address]  7.  Max 3 lidocaine patches at a time- but can use for pain.

## 2022-11-17 DIAGNOSIS — U071 COVID-19: Secondary | ICD-10-CM | POA: Diagnosis not present

## 2022-11-17 DIAGNOSIS — R0989 Other specified symptoms and signs involving the circulatory and respiratory systems: Secondary | ICD-10-CM | POA: Diagnosis not present

## 2022-11-21 ENCOUNTER — Ambulatory Visit: Payer: PPO | Admitting: Physical Therapy

## 2022-11-21 ENCOUNTER — Ambulatory Visit: Payer: PPO | Admitting: Occupational Therapy

## 2022-11-21 DIAGNOSIS — M6281 Muscle weakness (generalized): Secondary | ICD-10-CM

## 2022-11-21 DIAGNOSIS — R251 Tremor, unspecified: Secondary | ICD-10-CM

## 2022-11-21 DIAGNOSIS — R278 Other lack of coordination: Secondary | ICD-10-CM

## 2022-11-21 DIAGNOSIS — R29818 Other symptoms and signs involving the nervous system: Secondary | ICD-10-CM

## 2022-11-21 DIAGNOSIS — R2689 Other abnormalities of gait and mobility: Secondary | ICD-10-CM

## 2022-11-21 DIAGNOSIS — R2681 Unsteadiness on feet: Secondary | ICD-10-CM

## 2022-11-21 NOTE — Therapy (Unsigned)
OUTPATIENT PHYSICAL THERAPY NEURO TREATMENT   Patient Name: Katrina Rivera MRN: [id number] DOB:Mar 07, 1945, 77 y.o., female Today's Date: 11/22/2022   PCP: Noberto Retort, MD REFERRING PROVIDER: Charlton Amor, PA-C  END OF SESSION:  PT End of Session - 11/22/22 1837     Visit Number 12    Number of Visits 27    Date for PT Re-Evaluation 01/13/23    Authorization Type [employer]    Authorization Time Period 09-20-22 - 12-08-22; 11-21-22 - 02-07-23    Progress Note Due on Visit 10    PT Start Time 1103    PT Stop Time 1146    PT Time Calculation (min) 43 min    Equipment Utilized During Treatment Gait belt    Activity Tolerance Patient tolerated treatment well    Behavior During Therapy WFL for tasks assessed/performed                Past Medical History:  Diagnosis Date   Addison's disease (HCC)    takes Solu Cortef daily   Anemia    takes Ferrous Sulfate daily   Anxiety    takes Xanax nightly   Arthritis    Chronic back pain    stenosis   Depression    takes Cymbalta daily   Dizziness    if b/p drops    Fibromyalgia    History of blood transfusion    no abnormal reaction noted   History of bronchitis    many yrs ago    Hypokalemia    takes Potassium daily   Hypotension    takes Florinef daily   IBS (irritable bowel syndrome)    takes Align daily   Insomnia    takes Trazodone nightly   Joint pain    Multiple sclerosis (HCC)    doesn't take any meds   Multiple sclerosis (HCC)    New onset headache 12/22/2021   Nocturia    Osteoporosis    Palpitations    Peripheral neuropathy    Primary localized osteoarthritis of left knee 08/11/2020   Restless leg syndrome    Seasonal allergies    takes Zyrtec daily;uses Flonase daily as needed   Syncope    when missed methylprednisolone dose   Urinary frequency    takes Flomax daily   Weakness    numbness and tingling   Past Surgical History:  Procedure Laterality Date   ABDOMINAL  HYSTERECTOMY  02/07/1986   APPENDECTOMY  02/07/1986   CESAREAN SECTION  1973/1977   x2   CHOLECYSTECTOMY  02/08/1995   COLECTOMY  02/08/1988   COLONOSCOPY     ESOPHAGOGASTRODUODENOSCOPY     EYE SURGERY     bilateral - /w IOL- cataracts   FRACTURE SURGERY Right    rods and screws-right leg   LUMBAR LAMINECTOMY/DECOMPRESSION MICRODISCECTOMY Left 01/24/2013   Procedure: LUMBAR FIVE TO SACRAL ONE LUMBAR LAMINECTOMY/DECOMPRESSION MICRODISCECTOMY 1 LEVEL;  Surgeon: Tia Alert, MD;  Location: [facility];  Service: Neurosurgery;  Laterality: Left;   MAXIMUM ACCESS (MAS)POSTERIOR LUMBAR INTERBODY FUSION (PLIF) 1 LEVEL N/A 06/18/2014   Procedure: MAXIMUM ACCESS SURGERY POSTERIOR LUMBAR INTERBODY FUSION LUMBAR FIVE TO SACRAL ONE ;  Surgeon: Tia Alert, MD;  Location: [facility];  Service: Neurosurgery;  Laterality: N/A;   SPINAL CORD STIMULATOR BATTERY EXCHANGE Right 07/30/2021   Procedure: Spinal cord stimulator battery replacement;  Surgeon: Tia Alert, MD;  Location: [facility] OR;  Service: Neurosurgery;  Laterality: Right;   SPINAL CORD STIMULATOR IMPLANT  TOTAL KNEE ARTHROPLASTY Left 08/24/2020   Procedure: TOTAL KNEE ARTHROPLASTY;  Surgeon: Salvatore Marvel, MD;  Location: [facility];  Service: Orthopedics;  Laterality: Left;   Patient Active Problem List   Diagnosis Date Noted   Myofascial pain dysfunction syndrome 11/16/2022   Vertigo 11/16/2022   Lumbar burst fracture (HCC) 08/23/2022   Lumbar vertebral fracture (HCC) 08/19/2022   Chronic migraine w/o aura w/o status migrainosus, not intractable 03/29/2022   Mixed hyperlipidemia 12/27/2021   Chest pain of uncertain etiology 12/27/2021   New onset headache 12/22/2021   S/P lumbar fusion 07/30/2021   Primary localized osteoarthritis of left knee 08/11/2020   Atrophic vaginitis 08/11/2020   Menopausal symptom 08/11/2020   Restless leg syndrome    Chronic back pain    Low back pain without sciatica 11/01/2018   Essential  hypertension    SBO (small bowel obstruction) (HCC) 09/01/2016   Fibromyalgia    Abdominal pain, vomiting, and diarrhea    Dehydration    Somnolence, daytime 10/22/2015   Fatigue 10/22/2015   Chronic leg pain 06/29/2015   S/P lumbar spinal fusion 06/18/2014   Abnormality of gait 02/18/2013   S/P lumbar microdiscectomy 01/24/2013   Hypokalemia 12/24/2012   Sweating abnormality 10/19/2012   Dysautonomia orthostatic hypotension syndrome 08/16/2012   Hereditary and idiopathic peripheral neuropathy 08/16/2012   Chronic adrenal insufficiency (HCC) 08/16/2012   MS (multiple sclerosis) (HCC) 08/15/2012    ONSET DATE: 08-17-22  REFERRING DIAG: G35 (ICD-10-CM) - Multiple sclerosis   Diagnosis  S32.039D (ICD-10-CM) - Closed fracture of third lumbar vertebra with routine healing, unspecified fracture morphology, subsequent encounter    THERAPY DIAG:  Muscle weakness (generalized)  Other abnormalities of gait and mobility  Unsteadiness on feet  Other symptoms and signs involving the nervous system  Rationale for Evaluation and Treatment: Rehabilitation  SUBJECTIVE:                                                                                                                                                                                             SUBJECTIVE STATEMENT:  Pt reports she goes to [place] on 11-29-22 to neurosurgeon and will see how much longer she has to wear brace; reports no changes since previous visit last week  Pt accompanied by: self  PERTINENT HISTORY: spinal cord stimulator implant; MS diagnosed in 1984; s/p 2 back surgeries  Per Chart note "Brief HPI:   Katrina Rivera is a 77 y.o. female with a history of relapsing remitting multiple sclerosis who was evaluated by Dr. Yetta Barre regarding gait abnormality, chronic low back pain and status post lumbar fusion May 2024. She has reportedly  fallen 4 times at home after PLIF of L3-5. She was admitted 08/17/2022 for scheduled  surgery of L3 spinal fracture. She underwent posterior fixation L2-L4 using NuVasive cortical pedicle screws, arthrodesis and removal of segmental fixation L3-L5 by Dr. Yetta Barre. PT OT evaluations carried out. Incision is healed without signs of infection. She is appropriate back soreness. She has no complaints of leg pain or new numbness, tingling or weakness per Dr. Barnett Applebaum note today. Signs are stable and she is tolerating regular diet. Other past medical history includes restless leg syndrome, anxiety, headaches with migraine features, fibromyalgia, hypotension, anemia (acute on chronic)."    PAIN:  Are you having pain? Yes: NPRS scale: 6/10 Pain location: low back pain and bilateral shoulders - recent TP injections (9am 10/9)  Pain description: tightness, throbbing, sore Aggravating factors: no specific Relieving factors: Brace helps   PRECAUTIONS: Back  - No bending, lifting >10#, twisting, reaching overhead; Dr Yetta Barre responded with "regular back precautions" via inbasket message inquiring about precautions  RED FLAGS: None   WEIGHT BEARING RESTRICTIONS: No  FALLS: Has patient fallen in last 6 months? Yes. Number of falls 4  LIVING ENVIRONMENT: Lives with: lives with their spouse Lives in: House/apartment Stairs: Yes: External: 1 steps; planning on having rail installed Has following equipment at home: Walker - 2 wheeled, shower chair, and Grab bars  PLOF: Independent with household mobility with device, Requires assistive device for independence, and Needs assistance with ADLs  PATIENT GOALS: "walk without walker and back brace and get back to where I was before"  OBJECTIVE:   DIAGNOSTIC FINDINGS: IMPRESSION: 1. Unhealed fracture of the posterosuperior L3 body, ans associated avulsed appearance of both L3 pedicles (series 7, image 30) along the course of the bilateral L3 pedicle screws. Mild retropulsion of the posterior body fragment does not result in significant  spinal stenosis. 2. No other acute osseous abnormality identified in the Lumbar Spine. But scant arthrodesis limited to only the right L5-S1 posterior elements following L3 through S1 fusion. Some endplate subsidence at each level. 3.  Aortic Atherosclerosis (ICD10-I70.0).  Nephrolithiasis.  COGNITION: Overall cognitive status: Within functional limits for tasks assessed   SENSATION: Impaired sensation in arms and legs - numbness and tingling  COORDINATION: WFL's - slowed movement due to back pain  POSTURE: rounded shoulders and forward head  LOWER EXTREMITY ROM:   WFL's bil. LE's   LOWER EXTREMITY MMT:  grossly 4/5 throughout but pain with resistance due to back pain (surgical site)  BED MOBILITY:  Independent   TRANSFERS: Assistive device utilized: Environmental consultant - 4 wheeled  Sit to stand: Modified independence Stand to sit: Modified independence  STAIRS:  TBA  GAIT: Gait pattern:  increased knee flexion bil. LE's in stance  and step through pattern Distance walked: 4' Assistive device utilized: Environmental consultant - 2 wheeled Level of assistance: SBA Comments: pt amb. With bil. Knees flexed  FUNCTIONAL TESTS:  5 times sit to stand: 21.47 secs from chair without UE support with close CGA Timed up and go (TUG): 21.47 secs with RW 10 meter walk test: 21.56 = 1.52 ft/sec with RW Berg Balance Scale: to be assessed  - pt stood for 2" without UE support with SBA  PATIENT SURVEYS:  N/A - dx is MS  TODAY'S TREATMENT:        TherEx:   5x sit to stand = 19.68 secs - from chair with intermittent UE support on armrest  NeuroRe-ed:   TUG = 21.12 secs with RW  11/22/22 0001  Berg Balance Test  Sit to Stand 4  Standing Unsupported 4  Sitting with Back Unsupported but Feet Supported on Floor or Stool 4  Stand to Sit 4  Transfers 4  Standing Unsupported with Eyes Closed 3   Standing Unsupported with Feet Together 3  From Standing, Reach Forward with Outstretched Arm 3  From Standing Position, Pick up Object from Floor 3  From Standing Position, Turn to Look Behind Over each Shoulder 2  Turn 360 Degrees 2  Standing Unsupported, Alternately Place Feet on Step/Stool 1  Standing Unsupported, One Foot in Front 2  Standing on One Leg 2  Total Score 41     Gait:       Gait velocity 19.62 secs, 20.00 secs with RW = 1.67 ft/sec, 1.64 ft/sec respectively  PATIENT EDUCATION:  Education details: Continue HEP w/ addition, safe set up for banded lateral steps at home, relaxing shoulders when walking and not bearing too much weight through arms Person educated: Patient Education method: Explanation, Demonstration, Tactile cues, Verbal cues, and Handouts Education comprehension: verbalized understanding, returned demonstration, verbal cues required, tactile cues required, and needs further education  HOME EXERCISE PROGRAM: Access Code: [email or web address] URL: [email or web address] Date: 09/26/2022 Prepared by: Peter Congo  Exercises - Ankle Dorsiflexion with Resistance  - 1 x daily - 7 x weekly - 3 sets - 10 reps - Gastroc Stretch on Wall  - 1 x daily - 7 x weekly - 3 sets - 10 reps - Supine Knee Extension Stretch on Towel Roll  - 1 x daily - 7 x weekly - Supine Quad Set  - 1 x daily - 7 x weekly - 3 sets - 10 reps - Sit to Stand with Arms Crossed  - 1 x daily - 7 x weekly - 3 sets - 10 reps - Supine Bridge  - 1 x daily - 7 x weekly - 3 sets - 10 reps - Side Stepping with Resistance at Ankles and Counter Support  - 1 x daily - 7 x weekly - 3 sets - 10 reps   Access Code: [email or web address] - added 10-11-22 URL: [email or web address] Date: 10/12/2022 Prepared by: Maebelle Munroe  Exercises - Supine hamstring stretch  - 1 x daily - 7 x weekly - 1 sets - 2-3 reps - 60 sec hold  Access Code: [email or web address] URL: [email or web address] Date:  10/07/2022 Prepared by: Maebelle Munroe  Exercises - Single Leg Stance with Support  - 1 x daily - 7 x weekly - 1 sets - 2-3 reps - 10 sec hold   HEP issued: 10-04-22, updated 10-11-22 Access Code: [email or web address]:  added SLS exercise - Access Code: [email or web address] 10-06-22 URL: [email or web address] Date: 10/05/2022 Prepared by: Maebelle Munroe  Exercises - Alternating Step Forward with Support  - 1 x daily - 7 x weekly - 1 sets - 10 reps - Step Sideways  - 1 x daily - 7 x weekly - 1 sets - 10 reps - Alternating Step Backward with Support  - 1 x daily - 7 x weekly - 1 sets - 10 reps - Side Stepping with Counter Support  - 1 x daily - 7 x weekly - 1 sets - 2-3 reps - Standing March with Counter Support  - 1 x daily - 7 x weekly - 1 sets - 10 reps   GOALS: Goals reviewed with patient? Yes  NEW SHORT TERM GOALS: Target date:  12-16-22  Improve Berg score to >/= 45/56 to reduce fall risk. Baseline: 30/56 (8/19),  34/56 (9/11);  score 41/56 on 11-21-22 Goal status: Upgraded/Revised  2.  Pt will ambulate 75' with RW with SBA on flat, even surface for increased community accessibility. Baseline: 230 ft CGA no AD (9/11) Goal status: Upgraded/Revised  3.  Report decreased back pain to </= 3/10 intensity for increased ease & comfort with ADL's & mobility.  Baseline: 5/10, 6/10 (9/11) Goal status: Ongoing  4.  Improve 5x sit to stand score to </= 16 secs from chair without UE support to demo improved LE strength.  Baseline: 21.47 secs, 15.62 sec no UE (9/11);  19.68 secs on 11-21-22 Goal status: Revised  5.  Improve TUG score to </= 17.5 secs with RW to demo improved functional mobility.  Baseline: 21.47 secs with RW, 22 sec with RW (9/11); 21.12 secs on 11-21-22 Goal status: Ongoing  6.  Increase gait speed to >/= 1.9 ft/sec with RW with pt demonstrating bil. Knee extension in stance.  Baseline: 1.52 ft/sec with RW (21.56 secs), 1.52 ft/sec with RW (9/11); 1.67 ft/sec with RW (10-14) Goal  status: Ongoing   UPDATED LONG TERM GOALS: Target date: 01-13-23  Improve Berg score  >/= 48/56 to reduce fall risk & demo improved balance. Baseline: 30/56 (8/19); score 41/56 on 11-21-22 Goal status: Goal met 11-21-22; UPGRADED/REVISED  2.  Pt will be modified independent with household ambulation without device. Baseline: pt reports she is walking short distances in home without use of RW, but still using RW for longer distances in the home Goal status: Partially met - 11-21-22; ONGOING  3.  Improve 5x sit to stand score to </= 14 secs from chair without UE support to demo improved LE strength.  Baseline: 21.47 secs;  19.68 secs Goal status: Goal not met 11-21-22  4.  Improve TUG score to </= 14.5 secs with RW to demo improved functional mobility.  Baseline: 21.47 secs with RW;  21.12 secs   Goal status: Not met 11-21-22; ONGOING  5.   Increase gait speed to >/= 2.3 ft/sec with RW with pt demonstrating bil. Knee extension in stance.  Baseline: 1.52 ft/sec with RW (21.56 secs); 19.62,  20.00 = 1.67 ft/sec with RW Goal status: Goal not met 11-21-22; ONGOING  6.  Independent in HEP for balance and LE strengthening and core stabilization exercises. Baseline:  Goal status: Goal met 11-21-22  7.  Pt will transfer floor to stand with UE support on mat table with SBA.  Baseline:  TBA  Goal status:  NEW  ASSESSMENT:  CLINICAL IMPRESSION:  Pt has met LTG's #1 & 6:  Berg balance test score has increased from 30/56 to 41/56.  LTG's #3, 4, & 5 are not met due to decreased gait speed and decreased TUG score which remains essentially same as scores achieved at initial evaluation.  Pt continues to wear TLSO and continues to use RW for assistance with ambulation.  Pt will benefit from continued PT to address gait and balance deficits and LE weakness.  Pt agrees with, and requests, renewal for continuation of PT services.    OBJECTIVE IMPAIRMENTS: Abnormal gait, decreased activity tolerance,  decreased balance, decreased strength, and pain.   ACTIVITY LIMITATIONS: carrying, lifting, bending, squatting, stairs, transfers, reach over head, and locomotion level  PARTICIPATION LIMITATIONS: meal prep, cleaning, laundry, shopping, community activity, and pt reports she does not drive  PERSONAL FACTORS: Past/current experiences, Time since onset of injury/illness/exacerbation, and 1 comorbidity: MS and s/p back surgery  are also affecting patient's functional outcome.   REHAB  POTENTIAL: Good  CLINICAL DECISION MAKING: Evolving/moderate complexity  EVALUATION COMPLEXITY: Moderate  PLAN:  PT FREQUENCY: 2x/week  PT DURATION: 8 weeks  PLANNED INTERVENTIONS: Therapeutic exercises, Therapeutic activity, Neuromuscular re-education, Balance training, Gait training, Patient/Family education, Self Care, Stair training, DME instructions, and Aquatic Therapy  PLAN FOR NEXT SESSION: Cont gait training, LE strengthening, endurance, balance on compliant surfaces, hip abduction and flexion strengthening   Kerry Fort, PT  11/22/2022, 6:48 PM

## 2022-11-21 NOTE — Therapy (Signed)
OUTPATIENT OCCUPATIONAL THERAPY NEURO TREATMENT  Patient Name: Katrina Rivera MRN: [id number] DOB:[birth year], 77 y.o., female Today's Date: 11/21/2022  PCP: Noberto Retort, MD  REFERRING PROVIDER: Charlton Amor, PA-C  END OF SESSION:  OT End of Session - 11/21/22 1147     Visit Number 8    Number of Visits 25    Date for OT Re-Evaluation 12/30/22    Authorization Type [employer] - follows Medicare guidelines    Progress Note Due on Visit 10    OT Start Time 1150    OT Stop Time 1233    OT Time Calculation (min) 43 min    Activity Tolerance Patient tolerated treatment well    Behavior During Therapy WFL for tasks assessed/performed            Past Medical History:  Diagnosis Date   Addison's disease (HCC)    takes Solu Cortef daily   Anemia    takes Ferrous Sulfate daily   Anxiety    takes Xanax nightly   Arthritis    Chronic back pain    stenosis   Depression    takes Cymbalta daily   Dizziness    if b/p drops    Fibromyalgia    History of blood transfusion    no abnormal reaction noted   History of bronchitis    many yrs ago    Hypokalemia    takes Potassium daily   Hypotension    takes Florinef daily   IBS (irritable bowel syndrome)    takes Align daily   Insomnia    takes Trazodone nightly   Joint pain    Multiple sclerosis (HCC)    doesn't take any meds   Multiple sclerosis (HCC)    New onset headache 12/22/2021   Nocturia    Osteoporosis    Palpitations    Peripheral neuropathy    Primary localized osteoarthritis of left knee 08/11/2020   Restless leg syndrome    Seasonal allergies    takes Zyrtec daily;uses Flonase daily as needed   Syncope    when missed methylprednisolone dose   Urinary frequency    takes Flomax daily   Weakness    numbness and tingling   Past Surgical History:  Procedure Laterality Date   ABDOMINAL HYSTERECTOMY  02/07/1986   APPENDECTOMY  02/07/1986   CESAREAN SECTION  1973/1977   x2    CHOLECYSTECTOMY  02/08/1995   COLECTOMY  02/08/1988   COLONOSCOPY     ESOPHAGOGASTRODUODENOSCOPY     EYE SURGERY     bilateral - /w IOL- cataracts   FRACTURE SURGERY Right    rods and screws-right leg   LUMBAR LAMINECTOMY/DECOMPRESSION MICRODISCECTOMY Left 01/24/2013   Procedure: LUMBAR FIVE TO SACRAL ONE LUMBAR LAMINECTOMY/DECOMPRESSION MICRODISCECTOMY 1 LEVEL;  Surgeon: Tia Alert, MD;  Location: [facility];  Service: Neurosurgery;  Laterality: Left;   MAXIMUM ACCESS (MAS)POSTERIOR LUMBAR INTERBODY FUSION (PLIF) 1 LEVEL N/A 06/18/2014   Procedure: MAXIMUM ACCESS SURGERY POSTERIOR LUMBAR INTERBODY FUSION LUMBAR FIVE TO SACRAL ONE ;  Surgeon: Tia Alert, MD;  Location: [facility];  Service: Neurosurgery;  Laterality: N/A;   SPINAL CORD STIMULATOR BATTERY EXCHANGE Right 07/30/2021   Procedure: Spinal cord stimulator battery replacement;  Surgeon: Tia Alert, MD;  Location: [facility];  Service: Neurosurgery;  Laterality: Right;   SPINAL CORD STIMULATOR IMPLANT     TOTAL KNEE ARTHROPLASTY Left 08/24/2020   Procedure: TOTAL KNEE ARTHROPLASTY;  Surgeon: Salvatore Marvel, MD;  Location:  [facility];  Service: Orthopedics;  Laterality: Left;   Patient Active Problem List   Diagnosis Date Noted   Myofascial pain dysfunction syndrome 11/16/2022   Vertigo 11/16/2022   Lumbar burst fracture (HCC) 08/23/2022   Lumbar vertebral fracture (HCC) 08/19/2022   Chronic migraine w/o aura w/o status migrainosus, not intractable 03/29/2022   Mixed hyperlipidemia 12/27/2021   Chest pain of uncertain etiology 12/27/2021   New onset headache 12/22/2021   S/P lumbar fusion 07/30/2021   Primary localized osteoarthritis of left knee 08/11/2020   Atrophic vaginitis 08/11/2020   Menopausal symptom 08/11/2020   Restless leg syndrome    Chronic back pain    Low back pain without sciatica 11/01/2018   Essential hypertension    SBO (small bowel obstruction) (HCC) 09/01/2016   Fibromyalgia    Abdominal pain,  vomiting, and diarrhea    Dehydration    Somnolence, daytime 10/22/2015   Fatigue 10/22/2015   Chronic leg pain 06/29/2015   S/P lumbar spinal fusion 06/18/2014   Abnormality of gait 02/18/2013   S/P lumbar microdiscectomy 01/24/2013   Hypokalemia 12/24/2012   Sweating abnormality 10/19/2012   Dysautonomia orthostatic hypotension syndrome 08/16/2012   Hereditary and idiopathic peripheral neuropathy 08/16/2012   Chronic adrenal insufficiency (HCC) 08/16/2012   MS (multiple sclerosis) (HCC) 08/15/2012    ONSET DATE: 08/24/2022 (date of referral)  REFERRING DIAG: G35 (ICD-10-CM) - Multiple sclerosis  THERAPY DIAG:  Other lack of coordination  Other symptoms and signs involving the nervous system  Tremors of nervous system  Rationale for Evaluation and Treatment: Rehabilitation  SUBJECTIVE:   SUBJECTIVE STATEMENT: No changes in medication and no recent falls. Pt reports ordering new writing utensils and adaptive equipment from [employer] though has not yet attempted writing tasks. Pt reports continued difficulty with writing. Pt has not yet ordered Paraffin at this time.  Pt accompanied by: self  PERTINENT HISTORY: MS, addison's disease, anemia, anxiety, arthritis, chronic back pain, depression, fibromyalgia, hypotension, IBS, peripheral neuropathy, syncope, spinal cord stimulator, and prior back surgery. Pt underwent fusion of L2-4 and removal of segmental fixation of L3-5 to manage L3 spinal fx sustained during a fall (7/10 date of surgery).   PRECAUTIONS: Fall and Other: Pt recently had back surgery, no precautions reported or listed; Brace for back to be donned when up and moving; no lifting over 10 lbs  - contacted ortho for clarification  WEIGHT BEARING RESTRICTIONS: No  PAIN:  Are you having pain? Yes: NPRS scale: 6/10 Pain location: worse in back Pain description: aching Aggravating factors: "overdoing it," "holding tension in my shoulders" Relieving factors: rest;  scheduled medication every 6 hours   FALLS: Has patient fallen in last 6 months? Yes. Number of falls 4 including fall with lumbar fracture while reaching for pencil on the floor.   LIVING ENVIRONMENT: Lives with: lives with their spouse Lives in: House/apartment Stairs: Yes: External: 1 steps; none Has following equipment at home: Single point cane, Walker - 2 wheeled, Environmental consultant - 4 wheeled, Wheelchair (manual), shower chair, bed side commode, and toilet riser  PLOF: Independent; has not driven recently; retired from [employer]  PATIENT GOALS: Improve B hand function, especially R hand  OBJECTIVE:   HAND DOMINANCE: Right  ADLs: Overall ADLs: mostly mod I Eating: husband cuts food for her, which is baseline LB Dressing: min A for donning shoes Bathing: husband cleans back  Tub Shower transfers: min A  IADLs: Shopping: mod A Light housekeeping: doesn't vacuum at baseline; mod A  Community mobility: SBA  Handwriting: 50% legible  MOBILITY STATUS: Needs Assist: SBA with RW  ACTIVITY TOLERANCE: Activity tolerance: fair  FUNCTIONAL OUTCOME MEASURES: PSFS:  Total score = sum of the activity scores/number of activities Minimum detectable change (90%CI) for average score = 2 points Minimum detectable change (90%CI) for single activity score = 3 points  UPPER EXTREMITY ROM:    BUE WNL; however opposition B impaired  UPPER EXTREMITY MMT:     BUE elbow ext and flex WFL  HAND FUNCTION: Grip strength: Right: 34.6 lbs; Left: 22.4 lbs  COORDINATION: 9 Hole Peg test: Right: 40 sec; Left: 38 sec  SENSATION: Light touch: Impaired  and hx of neuropathy; mainly in R index finger  EDEMA: none reported or observed  MUSCLE TONE: WNL  COGNITION: Overall cognitive status: Within functional limits for tasks assessed  VISION: Subjective report: changes in acuities Baseline vision: Wears glasses for reading only and will be getting eye exam Visual history: glaucoma and  cataracts  VISION ASSESSMENT: WFL - to read clock on wall  PERCEPTION: Impaired: proprioception  PRAXIS: WFL  OBSERVATIONS: Pt appears well kept. She has RW with walker bag. Requires SBA for ambulation.    TODAY'S TREATMENT:                                                                                                                               Therapeutic Activities OT educated pt regarding joint protection strategies during writing tasks. Pt verbalized understanding.  Bilateral FM task with small pegs, crossing midline, to improve FM coordination and dexterity, crossing midline, sequencing, pattern recognition, and memory - Pt copied 3 color design with extra time and v/c to identify next peg in sequence. OT provided v/c and education for identifying comparative position of pegs and counting to identify next peg in sequence. Pt demo'd improved understanding of strategies as task progressed. Pt crossed midline with R and L hand to reach for pegs to complete 50% of design with R hand and remaining 50% of design with L hand.  Pt requested examples of pegboard options on [employer], therefore OT provided a printed example of a pegboard option and provided education that there may be other options available with a variety of prices, etc. Pt verbalized understanding.   Neuro Re-Ed Pt c/o B shoulder pain with pt reporting "I carry tension in my shoulders." Therefore OT initiated shoulder ROM for B shoulders to improve ROM, decrease pain, and improve positioning for tasks involving UE. Pt benefited from v/c and education to keep feet of floor and for upright seated posture and UE positioning. Exercises - Seated Shoulder Flexion Towel Slide at Table Top  - 5 reps - Seated Shoulder Abduction Towel Slide at Table Top  -  5 reps - Seated Shoulder Scaption Slide at Table Top with Forearm in Neutral  - 5 reps - Seated Scapular Retraction  - 5 reps - Seated Shoulder Shrug  -  5 reps - Seated  Shoulder  Shrug Circles AROM Backward  - 5 reps  OT educated pt to place handout information in memory binder and reviewed memory binder purpose. Pt verbalized understanding.   PATIENT EDUCATION: Education details: See Today's Treatment above Person educated: Patient Education method: Explanation, Demonstration, Verbal cues, and Handouts Education comprehension: verbalized understanding, returned demonstration, verbal cues required, and needs further education  HOME EXERCISE PROGRAM: 10/12/2022: Coordination and yellow putty HEPs 10/19/2022: WARM memory strategy 11/21/22: B Shoulder ROM Access Code: [email or web address]  GOALS:  SHORT TERM GOALS: Target date: 10/18/2022    Patient will demonstrate independence with initial HEP. Baseline: Goal status: MET  2.  Patient will independently verbalize at least 3 energy conservation principles in relation to ADLs to increase functional independence.  Baseline:  Goal status: IN Progress   LONG TERM GOALS: Target date: 12/30/2022    Patient will report at least two-point increase in average PSFS score or at least three-point increase in a single activity score indicating functionally significant improvement given minimum detectable change.  Baseline: 1.3 total score (See above for individual activity scores)  Goal status: IN PROGRESS  2.  Patient will demonstrate at least 30 lbs L grip strength as needed to open jars and other containers.  Baseline: 22.4 lbs Goal status: IN Progress  3.  Patient will demonstrate improvement with nine-hole peg by at least 5 seconds bilaterally. Baseline: Right: 40 sec; Left: 38 sec Goal status: IN Progress  4.  Pt will report handwriting to be at least 75% of prior legibility/neatness. Baseline: 50% legibility Goal status: IN Progress   ASSESSMENT:  CLINICAL IMPRESSION: Pt tolerated tasks well today though benefited from continuing education for cognitive strategies and v/c for positioning, seated  posture, and strategies to complete tasks.  PERFORMANCE DEFICITS: in functional skills including ADLs, IADLs, coordination, sensation, strength, pain, Fine motor control, mobility, endurance, decreased knowledge of precautions, decreased knowledge of use of DME, and UE functional use.  IMPAIRMENTS: are limiting patient from ADLs and IADLs.   CO-MORBIDITIES: has co-morbidities such as recent spinal surgery  that affects occupational performance. Patient will benefit from skilled OT to address above impairments and improve overall function.  REHAB POTENTIAL: Fair given chronicity of impairments   PLAN:  OT FREQUENCY: additional 2x/week  OT DURATION: additional 6 weeks  PLANNED INTERVENTIONS: self care/ADL training, therapeutic exercise, therapeutic activity, neuromuscular re-education, manual therapy, functional mobility training, electrical stimulation, fluidotherapy, moist heat, patient/family education, energy conservation, DME and/or AE instructions, and Re-evaluation  RECOMMENDED OTHER SERVICES: ST referral  CONSULTED AND AGREED WITH PLAN OF CARE: Patient  PLAN FOR NEXT SESSION: tremor reduction strategies; oval 8 splint (how is it going?)  Review, update and provide AE ideas for joint protection  Review memory Binder to collect HEP, resources etc  Check out paraffin for arthritis/hand pain (order on [employer]?)  11/21/22 - OT recommended pt bring pt's new A/E writing utensils to next session. Practice with A/E writing accessories if available next session   Wynetta Emery, OT 11/21/2022, 12:51 PM

## 2022-11-21 NOTE — Patient Instructions (Signed)
  Functional Quadriceps: Sit to Stand - USE HANDS AS NEEDED    Sit on edge of chair, feet flat on floor. Stand upright, extending knees fully. Repeat __10__ times per set. Do __1__ sets per session. Do 1 sessions per day.  http://orth.exer.us/735        SINGLE LIMB STANCE - HOLD ONTO COUNTER    Stance: single leg on floor. Raise leg. Hold _10__ seconds. Repeat with other leg. __2_ reps per set, __1_ sets per day, __5_ days per week      Bear Roots on One Leg - Modified - slight turning of body (rotation)    Stand in bilateral Stance, elbows bent, palms lightly resting on back of chair. Shift weight onto one foot. Lift unweighted foot. Variation: Lift heel only. Repeat __5_ times each side to each side.   SHIFT WEIGHT ONTO RIGHT LEG - UNWEIGHT LEFT LEG - SLIGHT TURN TO RIGHT SIDE  SHIFT WEIGHT ONTO LEFT LEG - UNWEIGHT RIGHT LEG - SLIGHT TURN TO LEFT SIDE

## 2022-11-22 ENCOUNTER — Encounter: Payer: Self-pay | Admitting: Physical Therapy

## 2022-11-22 NOTE — Progress Notes (Signed)
   11/22/22 0001  Berg Balance Test  Sit to Stand 4  Standing Unsupported 4  Sitting with Back Unsupported but Feet Supported on Floor or Stool 4  Stand to Sit 4  Transfers 4  Standing Unsupported with Eyes Closed 3  Standing Unsupported with Feet Together 3  From Standing, Reach Forward with Outstretched Arm 3  From Standing Position, Pick up Object from Floor 3  From Standing Position, Turn to Look Behind Over each Shoulder 2  Turn 360 Degrees 2  Standing Unsupported, Alternately Place Feet on Step/Stool 1  Standing Unsupported, One Foot in Front 2  Standing on One Leg 2  Total Score 41

## 2022-11-23 ENCOUNTER — Encounter: Payer: Self-pay | Admitting: Endocrinology

## 2022-11-23 ENCOUNTER — Ambulatory Visit: Payer: PPO | Admitting: Endocrinology

## 2022-11-23 VITALS — BP 132/60 | HR 105 | Resp 20 | Ht 60.0 in | Wt 111.4 lb

## 2022-11-23 DIAGNOSIS — E2749 Other adrenocortical insufficiency: Secondary | ICD-10-CM | POA: Diagnosis not present

## 2022-11-23 DIAGNOSIS — I951 Orthostatic hypotension: Secondary | ICD-10-CM

## 2022-11-23 MED ORDER — METHYLPREDNISOLONE 4 MG PO TABS: ORAL_TABLET | ORAL | 3 refills | Status: DC

## 2022-11-23 MED ORDER — FLUDROCORTISONE ACETATE 0.1 MG PO TABS
ORAL_TABLET | ORAL | 3 refills | Status: DC
Start: 2022-11-23 — End: 2023-02-24

## 2022-11-23 NOTE — Progress Notes (Signed)
Outpatient Endocrinology Note Katrina Ramiz Turpin, MD  11/23/22  Patient's Name: Katrina Rivera    DOB: 05-21-1945    MRN: [id number]  REASON OF VISIT: Follow up of  adrenal insufficiency/dysautonomic orthostatic hypotension/hypercalcemia.  PCP: Noberto Retort, MD  HISTORY OF PRESENT ILLNESS:   Katrina Rivera is a 77 y.o. old female with past medical history listed below, is here for follow up of  adrenal insufficiency/dysautonomic orthostatic hypotension/hypercalcemia..   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  He was diagnosed with dysautonomia and had multiple other problems related to autonomic neuropathy.  She has been on Florinef since 2004, previously she had taken 3 tablets daily along with potassium supplement.  She had tried midodrine in the past in 2014 and this was later stopped when blood pressure increased.  Florinef was adjusted multiple times in the past for regulating her blood pressure.  Most recently she has been taking Florinef 0.05 mg daily.  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  Prior testing included cosyntropin stimulation test showing a stimulated cortisol level of 15.7 and baseline cortisol 3.9.  Confirmed by 24-hour urine free cortisol which was only 3.0.  She was symptomatic with weight loss, decreased appetite, nausea and diarrhea.  Cosyntropin stimulation test in February 2016 showed baseline cortisol level of 0.3 and post injection of 1.1 only. She had GI effects from hydrocortisone and prednisone, was in the past using hydrocortisone injection using insulin syringe. Most recently she has been using methylprednisolone twice a day 4 mg in the morning and 2 mg in the afternoon, previously not able to taper the dose down.  Hypercalcemia: She has intermittent hypercalcemia since at least 2018, her PTH level was 31 previously and subsequent 23 and 38.   1, 25 vitamin D level was normal.  Serum calcium was as high as 11.2 and mostly intermittently mildly elevated.  She has been taking calcium and vitamin D supplement for osteoporosis.  # Osteoporosis managed by orthopedic surgeon/primary care provider on Prolia 60 mg every 6 months.  Interval history 11/23/22 Patient has been taking methylprednisolone 4 mg in the morning and 2 mg in the late afternoon.  She reports compliance.  She has been taking Florinef 0.05 mg daily.  Denies lightheadedness and dizziness.  Blood pressure has remained normal.  Today blood pressure in the clinic is standing 140/68.  Recent lab with normal serum sodium and potassium.  Serum calcium is mildly elevated 10.8.  Renal function is relatively stable.  No other complaints today.  REVIEW OF SYSTEMS:  As per history of present illness.   PAST MEDICAL HISTORY: Past Medical History:  Diagnosis Date   Addison's disease (HCC)    takes Solu Cortef daily   Anemia    takes Ferrous Sulfate daily   Anxiety    takes Xanax nightly   Arthritis    Chronic back pain    stenosis   Depression    takes Cymbalta daily   Dizziness    if b/p drops    Fibromyalgia    History of blood transfusion    no abnormal reaction noted   History of bronchitis    many yrs ago    Hypokalemia    takes Potassium daily   Hypotension    takes Florinef daily   IBS (irritable bowel syndrome)    takes Align daily   Insomnia    takes Trazodone nightly   Joint pain  Multiple sclerosis (HCC)    doesn't take any meds   Multiple sclerosis (HCC)    New onset headache 12/22/2021   Nocturia    Osteoporosis    Palpitations    Peripheral neuropathy    Primary localized osteoarthritis of left knee 08/11/2020   Restless leg syndrome    Seasonal allergies    takes Zyrtec daily;uses Flonase daily as needed   Syncope    when missed methylprednisolone dose   Urinary frequency    takes Flomax daily   Weakness    numbness and tingling     PAST SURGICAL HISTORY: Past Surgical History:  Procedure Laterality Date   ABDOMINAL HYSTERECTOMY  02/07/1986   APPENDECTOMY  02/07/1986   CESAREAN SECTION  1973/1977   x2   CHOLECYSTECTOMY  02/08/1995   COLECTOMY  02/08/1988   COLONOSCOPY     ESOPHAGOGASTRODUODENOSCOPY     EYE SURGERY     bilateral - /w IOL- cataracts   FRACTURE SURGERY Right    rods and screws-right leg   LUMBAR LAMINECTOMY/DECOMPRESSION MICRODISCECTOMY Left 01/24/2013   Procedure: LUMBAR FIVE TO SACRAL ONE LUMBAR LAMINECTOMY/DECOMPRESSION MICRODISCECTOMY 1 LEVEL;  Surgeon: Tia Alert, MD;  Location: [facility];  Service: Neurosurgery;  Laterality: Left;   MAXIMUM ACCESS (MAS)POSTERIOR LUMBAR INTERBODY FUSION (PLIF) 1 LEVEL N/A 06/18/2014   Procedure: MAXIMUM ACCESS SURGERY POSTERIOR LUMBAR INTERBODY FUSION LUMBAR FIVE TO SACRAL ONE ;  Surgeon: Tia Alert, MD;  Location: [facility];  Service: Neurosurgery;  Laterality: N/A;   SPINAL CORD STIMULATOR BATTERY EXCHANGE Right 07/30/2021   Procedure: Spinal cord stimulator battery replacement;  Surgeon: Tia Alert, MD;  Location: [facility];  Service: Neurosurgery;  Laterality: Right;   SPINAL CORD STIMULATOR IMPLANT     TOTAL KNEE ARTHROPLASTY Left 08/24/2020   Procedure: TOTAL KNEE ARTHROPLASTY;  Surgeon: Salvatore Marvel, MD;  Location: [facility];  Service: Orthopedics;  Laterality: Left;    ALLERGIES: Allergies  Allergen Reactions   Amoxicillin Other (See Comments)    Upset stomach   Aspirin     bruising   Azithromycin     stomach upset   Doxycycline Hyclate     GI upset   Hydroxyzine Hcl Other (See Comments)   Buspirone Hcl Palpitations   Mirtazapine Palpitations    Weight gain    FAMILY HISTORY:  Family History  Problem Relation Age of Onset   Hypertension Mother    Stroke Mother    Heart attack Father    Tremor Brother     SOCIAL HISTORY: Social History   Socioeconomic History   Marital status: Married    Spouse name: [name]    Number of children: 2   Years of education: 12   Highest education level: Not on file  Occupational History    Employer: DISABLED    Comment: Disabled  Tobacco Use   Smoking status: Never   Smokeless tobacco: Never  Vaping Use   Vaping status: Never Used  Substance and Sexual Activity   Alcohol use: Never   Drug use: Never   Sexual activity: Not on file  Other Topics Concern   Not on file  Social History Narrative   Pt lives at home with spouse. Gabriel Rung) [phone] (patient's cell)   [name]'s cell  [phone]      Caffeine Use: 1 cups daily.Right handed.Disabled.Education - high schoolPatient has two adult children.   Social Determinants of Health   Financial Resource Strain: Not on file  Food Insecurity: No Food Insecurity (08/17/2022)  Hunger Vital Sign    Worried About Programme researcher, broadcasting/film/video in the Last Year: Never true    Ran Out of Food in the Last Year: Never true  Transportation Needs: Not on file  Physical Activity: Not on file  Stress: Not on file  Social Connections: Not on file    MEDICATIONS:  Current Outpatient Medications  Medication Sig Dispense Refill   acetaminophen (TYLENOL) 325 MG tablet Take 1-2 tablets (325-650 mg total) by mouth every 4 (four) hours as needed for mild pain.     alfuzosin (UROXATRAL) 10 MG 24 hr tablet Take 10 mg by mouth daily with breakfast.     amLODipine (NORVASC) 2.5 MG tablet Take 1 tablet (2.5 mg total) by mouth daily. 30 tablet 0   cephALEXin (KEFLEX) 250 MG capsule Take 250 mg by mouth every other day.     cholecalciferol (VITAMIN D3) 25 MCG (1000 UNIT) tablet Take 1,000 Units by mouth every morning.     clonazePAM (KLONOPIN) 0.5 MG tablet Take 1 tablet (0.5 mg total) by mouth at bedtime as needed for anxiety. (Patient taking differently: Take 0.5 mg by mouth at bedtime.) 30 tablet 1   CRANBERRY PO Take 1 tablet by mouth 2 (two) times daily.     dicyclomine (BENTYL) 10 MG capsule Take 20 mg by mouth 3 (three) times daily before  meals.     DULoxetine (CYMBALTA) 60 MG capsule Take 1 capsule (60 mg total) by mouth daily. 90 capsule 3   ferrous sulfate 325 (65 FE) MG tablet Take 325 mg by mouth daily with breakfast.     fluticasone (CUTIVATE) 0.05 % cream Apply 1 application  topically 2 (two) times daily as needed for irritation (Rosacea).     fluticasone (FLONASE) 50 MCG/ACT nasal spray Place 1 spray into both nostrils daily as needed for allergies or rhinitis.     gabapentin (NEURONTIN) 300 MG capsule Take 3 capsules (900 mg total) by mouth at bedtime.     gabapentin (NEURONTIN) 300 MG capsule Take 1 capsule (300 mg total) by mouth daily at 6 (six) AM.     Multiple Vitamin (MULTIVITAMIN) tablet Take 1 tablet by mouth in the morning.     oxyCODONE-acetaminophen (PERCOCET/ROXICET) 5-325 MG tablet Take 1 tablet by mouth every 6 (six) hours as needed for severe pain.     Polyethyl Glycol-Propyl Glycol (SYSTANE OP) Place 2 drops into both eyes daily as needed (for dry eyes).     polyethylene glycol (MIRALAX) 17 g packet Take 17 g by mouth 2 (two) times daily. 17 grams in 6 oz of favorite drink twice a day until bowel movement.  LAXITIVE.  Restart if two days since last bowel movement (Patient taking differently: Take 17 g by mouth daily as needed for moderate constipation.) 14 packet 0   Probiotic Product (PROBIOTIC PO) Take 1 capsule by mouth every morning.     promethazine (PHENERGAN) 25 MG tablet Take 25 mg by mouth every 6 (six) hours as needed for nausea or vomiting.     tiZANidine (ZANAFLEX) 2 MG tablet Take 2 tablets (4 mg total) by mouth every 8 (eight) hours as needed for muscle spasms. (Patient taking differently: Take 4 mg by mouth at bedtime.) 60 tablet 1   fludrocortisone (FLORINEF) 0.1 MG tablet Take 1 tablet daily 90 tablet 3   HYDROcodone-acetaminophen (NORCO) 7.5-325 MG tablet Take 1 tablet by mouth every 4 (four) hours as needed for moderate pain ((score 4 to 6)). (Patient not taking: Reported  on 10/19/2022) 30  tablet 0   methylPREDNISolone (MEDROL) 4 MG tablet as directed Orally 1 tablet AM and 1/2 tablet afternoon and take additional in case of illness as instructed. 160 tablet 3   No current facility-administered medications for this visit.    PHYSICAL EXAM: Vitals:   11/23/22 1341  BP: 132/60  Pulse: (!) 105  Resp: 20  SpO2: 96%  Weight: 111 lb 6.4 oz (50.5 kg)  Height: 5' (1.524 m)   Body mass index is 21.76 kg/m.   General: Well developed, well nourished female in no apparent distress. HEENT: AT/[place], no external lesions. Hearing intact to the spoken word Eyes: Conjunctiva clear and no icterus. Neck: Trachea midline, neck supple  Abdomen: Soft, non tender Neurologic: Alert, oriented, normal speech Extremities: trace pedal pitting edema. Bruise +.  Skin: Warm, color good.  Psychiatric: Does not appear depressed or anxious  PERTINENT HISTORIC LABORATORY AND IMAGING STUDIES:  All pertinent laboratory results were reviewed. Please see HPI for further details.  Lab Results  Component Value Date   CO2 31 11/16/2022   CL 98 11/16/2022   NA 139 11/16/2022   GLUCOSE 103 (H) 11/16/2022   BUN 21 11/16/2022   No components found for: "CORTRAND", "CORTISOL TOTAL AM", "ALDOSTERONE", "RENIN ACTIVITY", "DEHYDROEPIANDROSTERONE SULFATE", "CATECHOLAMINES FRACTIONATED"    ASSESSMENT / PLAN  1. Secondary adrenal insufficiency (HCC)   2. Hypercalcemia   3. Dysautonomia orthostatic hypotension syndrome    # Patient has longstanding history of secondary adrenal insufficiency. -Continue methylprednisolone 4 mg in the morning and 2 mg in the late afternoon. -Discussed illness rule out for steroid to take 2-3 times the usual dose in case of illness.  # Patient has longstanding history of orthostatic dysautonomic hypotension syndrome. -Continue Florinef 0.05 mg daily. -Advised to monitor blood pressure including standing blood pressure at home.  # Hypercalcemia -Mild hyperglycemia  intermittently.  PTH was previously normal. -She has osteoporosis on Prolia managed by orthopedic surgeon/primary care provider.  Will check PTH, renin, vitamin D and renal function panel in next follow-up visit.  Diagnoses and all orders for this visit:  Secondary adrenal insufficiency (HCC) -     fludrocortisone (FLORINEF) 0.1 MG tablet; Take 1 tablet daily -     Renal function panel; Future  Hypercalcemia -     Renal function panel; Future -     Parathyroid hormone, intact (no Ca); Future -     VITAMIN D 25 Hydroxy (Vit-D Deficiency, Fractures); Future  Dysautonomia orthostatic hypotension syndrome -     Renal function panel; Future -     Renin; Future  Other orders -     methylPREDNISolone (MEDROL) 4 MG tablet; as directed Orally 1 tablet AM and 1/2 tablet afternoon and take additional in case of illness as instructed.    DISPOSITION Follow up in clinic in 3 months suggested.  All questions answered and patient verbalized understanding of the plan.  Katrina Kagen Kunath, MD [facility] [address] [place], [address] [place], [place] [address] Phone # [phone]  At least part of this note was generated using voice recognition software. Inadvertent word errors may have occurred, which were not recognized during the proofreading process.

## 2022-11-24 ENCOUNTER — Ambulatory Visit: Payer: PPO | Admitting: Physical Therapy

## 2022-11-24 ENCOUNTER — Ambulatory Visit: Payer: PPO | Admitting: Occupational Therapy

## 2022-11-24 DIAGNOSIS — R278 Other lack of coordination: Secondary | ICD-10-CM

## 2022-11-24 DIAGNOSIS — R251 Tremor, unspecified: Secondary | ICD-10-CM

## 2022-11-24 DIAGNOSIS — M6281 Muscle weakness (generalized): Secondary | ICD-10-CM | POA: Diagnosis not present

## 2022-11-24 DIAGNOSIS — R2681 Unsteadiness on feet: Secondary | ICD-10-CM

## 2022-11-24 DIAGNOSIS — R2689 Other abnormalities of gait and mobility: Secondary | ICD-10-CM

## 2022-11-24 DIAGNOSIS — R29818 Other symptoms and signs involving the nervous system: Secondary | ICD-10-CM

## 2022-11-24 NOTE — Therapy (Signed)
OUTPATIENT OCCUPATIONAL THERAPY NEURO TREATMENT  Patient Name: Katrina Rivera MRN: [id number] DOB:[birth year], 77 y.o., female Today's Date: 11/24/2022  PCP: Noberto Retort, MD  REFERRING PROVIDER: Charlton Amor, PA-C  END OF SESSION:  OT End of Session - 11/24/22 1011     Visit Number 9    Number of Visits 25    Date for OT Re-Evaluation 12/30/22    Authorization Type [employer] - follows Medicare guidelines    Progress Note Due on Visit 10    OT Start Time 1015    OT Stop Time 1057    OT Time Calculation (min) 42 min    Activity Tolerance Patient tolerated treatment well    Behavior During Therapy WFL for tasks assessed/performed             Past Medical History:  Diagnosis Date   Addison's disease (HCC)    takes Solu Cortef daily   Anemia    takes Ferrous Sulfate daily   Anxiety    takes Xanax nightly   Arthritis    Chronic back pain    stenosis   Depression    takes Cymbalta daily   Dizziness    if b/p drops    Fibromyalgia    History of blood transfusion    no abnormal reaction noted   History of bronchitis    many yrs ago    Hypokalemia    takes Potassium daily   Hypotension    takes Florinef daily   IBS (irritable bowel syndrome)    takes Align daily   Insomnia    takes Trazodone nightly   Joint pain    Multiple sclerosis (HCC)    doesn't take any meds   Multiple sclerosis (HCC)    New onset headache 12/22/2021   Nocturia    Osteoporosis    Palpitations    Peripheral neuropathy    Primary localized osteoarthritis of left knee 08/11/2020   Restless leg syndrome    Seasonal allergies    takes Zyrtec daily;uses Flonase daily as needed   Syncope    when missed methylprednisolone dose   Urinary frequency    takes Flomax daily   Weakness    numbness and tingling   Past Surgical History:  Procedure Laterality Date   ABDOMINAL HYSTERECTOMY  02/07/1986   APPENDECTOMY  02/07/1986   CESAREAN SECTION  1973/1977   x2    CHOLECYSTECTOMY  02/08/1995   COLECTOMY  02/08/1988   COLONOSCOPY     ESOPHAGOGASTRODUODENOSCOPY     EYE SURGERY     bilateral - /w IOL- cataracts   FRACTURE SURGERY Right    rods and screws-right leg   LUMBAR LAMINECTOMY/DECOMPRESSION MICRODISCECTOMY Left 01/24/2013   Procedure: LUMBAR FIVE TO SACRAL ONE LUMBAR LAMINECTOMY/DECOMPRESSION MICRODISCECTOMY 1 LEVEL;  Surgeon: Tia Alert, MD;  Location: [facility];  Service: Neurosurgery;  Laterality: Left;   MAXIMUM ACCESS (MAS)POSTERIOR LUMBAR INTERBODY FUSION (PLIF) 1 LEVEL N/A 06/18/2014   Procedure: MAXIMUM ACCESS SURGERY POSTERIOR LUMBAR INTERBODY FUSION LUMBAR FIVE TO SACRAL ONE ;  Surgeon: Tia Alert, MD;  Location: [facility];  Service: Neurosurgery;  Laterality: N/A;   SPINAL CORD STIMULATOR BATTERY EXCHANGE Right 07/30/2021   Procedure: Spinal cord stimulator battery replacement;  Surgeon: Tia Alert, MD;  Location: [facility] OR;  Service: Neurosurgery;  Laterality: Right;   SPINAL CORD STIMULATOR IMPLANT     TOTAL KNEE ARTHROPLASTY Left 08/24/2020   Procedure: TOTAL KNEE ARTHROPLASTY;  Surgeon: Salvatore Marvel, MD;  Location: [facility];  Service: Orthopedics;  Laterality: Left;   Patient Active Problem List   Diagnosis Date Noted   Myofascial pain dysfunction syndrome 11/16/2022   Vertigo 11/16/2022   Lumbar burst fracture (HCC) 08/23/2022   Lumbar vertebral fracture (HCC) 08/19/2022   Chronic migraine w/o aura w/o status migrainosus, not intractable 03/29/2022   Mixed hyperlipidemia 12/27/2021   Chest pain of uncertain etiology 12/27/2021   New onset headache 12/22/2021   S/P lumbar fusion 07/30/2021   Primary localized osteoarthritis of left knee 08/11/2020   Atrophic vaginitis 08/11/2020   Menopausal symptom 08/11/2020   Restless leg syndrome    Chronic back pain    Low back pain without sciatica 11/01/2018   Essential hypertension    SBO (small bowel obstruction) (HCC) 09/01/2016   Fibromyalgia    Abdominal pain,  vomiting, and diarrhea    Dehydration    Somnolence, daytime 10/22/2015   Fatigue 10/22/2015   Chronic leg pain 06/29/2015   S/P lumbar spinal fusion 06/18/2014   Abnormality of gait 02/18/2013   S/P lumbar microdiscectomy 01/24/2013   Hypokalemia 12/24/2012   Sweating abnormality 10/19/2012   Dysautonomia orthostatic hypotension syndrome 08/16/2012   Hereditary and idiopathic peripheral neuropathy 08/16/2012   Chronic adrenal insufficiency (HCC) 08/16/2012   MS (multiple sclerosis) (HCC) 08/15/2012    ONSET DATE: 08/24/2022 (date of referral)  REFERRING DIAG: G35 (ICD-10-CM) - Multiple sclerosis  THERAPY DIAG:  Other lack of coordination  Other symptoms and signs involving the nervous system  Tremors of nervous system  Rationale for Evaluation and Treatment: Rehabilitation  SUBJECTIVE:   SUBJECTIVE STATEMENT: No changes in medication and no recent falls. Pt reports increased pain today in low back.  Pt accompanied by: self  PERTINENT HISTORY: MS, addison's disease, anemia, anxiety, arthritis, chronic back pain, depression, fibromyalgia, hypotension, IBS, peripheral neuropathy, syncope, spinal cord stimulator, and prior back surgery. Pt underwent fusion of L2-4 and removal of segmental fixation of L3-5 to manage L3 spinal fx sustained during a fall (7/10 date of surgery).   PRECAUTIONS: Fall and Other: Pt recently had back surgery, no precautions reported or listed; Brace for back to be donned when up and moving; no lifting over 10 lbs  - contacted ortho for clarification  WEIGHT BEARING RESTRICTIONS: No  PAIN:  Are you having pain? Yes: NPRS scale: 7/10 Pain location: worse in low back Pain description: aching Aggravating factors: near incision site on low back,  Relieving factors: rest; scheduled medication every 6 hours   FALLS: Has patient fallen in last 6 months? Yes. Number of falls 4 including fall with lumbar fracture while reaching for pencil on the floor.    LIVING ENVIRONMENT: Lives with: lives with their spouse Lives in: House/apartment Stairs: Yes: External: 1 steps; none Has following equipment at home: Single point cane, Walker - 2 wheeled, Environmental consultant - 4 wheeled, Wheelchair (manual), shower chair, bed side commode, and toilet riser  PLOF: Independent; has not driven recently; retired from [employer]  PATIENT GOALS: Improve B hand function, especially R hand  OBJECTIVE:   HAND DOMINANCE: Right  ADLs: Overall ADLs: mostly mod I Eating: husband cuts food for her, which is baseline LB Dressing: min A for donning shoes Bathing: husband cleans back  Tub Shower transfers: min A  IADLs: Shopping: mod A Light housekeeping: doesn't vacuum at baseline; mod A  Community mobility: SBA Handwriting: 50% legible  MOBILITY STATUS: Needs Assist: SBA with RW  ACTIVITY TOLERANCE: Activity tolerance: fair  FUNCTIONAL OUTCOME MEASURES: PSFS:  Total score = sum of the activity scores/number of activities Minimum detectable change (90%CI) for average score = 2 points Minimum detectable change (90%CI) for single activity score = 3 points  UPPER EXTREMITY ROM:    BUE WNL; however opposition B impaired  UPPER EXTREMITY MMT:     BUE elbow ext and flex WFL  HAND FUNCTION: Grip strength: Right: 34.6 lbs; Left: 22.4 lbs  COORDINATION: 9 Hole Peg test: Right: 40 sec; Left: 38 sec  SENSATION: Light touch: Impaired  and hx of neuropathy; mainly in R index finger  EDEMA: none reported or observed  MUSCLE TONE: WNL  COGNITION: Overall cognitive status: Within functional limits for tasks assessed  VISION: Subjective report: changes in acuities Baseline vision: Wears glasses for reading only and will be getting eye exam Visual history: glaucoma and cataracts  VISION ASSESSMENT: WFL - to read clock on wall  PERCEPTION: Impaired: proprioception  PRAXIS: WFL  OBSERVATIONS: Pt appears well kept. She has RW with walker bag.  Requires SBA for ambulation.    TODAY'S TREATMENT:                                                                                                                               Therapeutic Activities Pt c/o increased pain in low back today. Pt completed x10 shoulder squeezes and OT noted pt demo'd rounded shoulders and poor seated posture. Therefore, OT provided education for shoulder HEP with correct posture, core strength and stability, proximal stability for distal mobility, avoiding exercises which cause increased pain, nutrition, pacing exercises throughout day for fatigue management. Pt acknowledged understanding and demo'd improved upright seated posture with fading v/c.  OT educated patient on energy conservation, deep breathing strategies, stress management. Pt acknowledged understanding. Handouts provided, see pt instructions.    Cursive then printing handwriting task on 3-lined paper with pt's "Twist n Write" adapted pen - to improve FM coordination, dexterity, and B integration for handwriting. - Pt traced x2 simple sentences then progressed to copying. OT provided mod v/c for larger letters. Pt demo'd improved legibility for handwriting when printing instead of cursive: Approx. 15% legibility with cursive, approx. 80-90% legibility with printing. OT educated pt on weighted pen options to decrease tremors, showed examples on [employer], printed examples. Pt verbalized understanding. Pt reports "[writing with adapted pen] doesn't feel as bad when printing" [compared to cursive writing]. OT recommended pt continue practicing with adapted pen and writing using print letters at home. OT educated pt on purpose of pencil grips to improve dominant UE stability when handwriting. Pt verbalized understanding.  PATIENT EDUCATION: Education details: See Today's Treatment above Person educated: Patient Education method: Explanation, Demonstration, Verbal cues, and Handouts Education comprehension:  verbalized understanding, returned demonstration, verbal cues required, and needs further education  HOME EXERCISE PROGRAM: 10/12/2022: Coordination and yellow putty HEPs 10/19/2022: WARM memory strategy 11/21/22: B Shoulder ROM Access Code: [email or web address] 11/24/22: Energy conservation and deep breathing (handouts), print (not cursive) handwriting with  big letters (no handout)  GOALS:  SHORT TERM GOALS: Target date: 10/18/2022    Patient will demonstrate independence with initial HEP. Baseline: Goal status: MET  2.  Patient will independently verbalize at least 3 energy conservation principles in relation to ADLs to increase functional independence.  Baseline:  Goal status: IN Progress   LONG TERM GOALS: Target date: 12/30/2022    Patient will report at least two-point increase in average PSFS score or at least three-point increase in a single activity score indicating functionally significant improvement given minimum detectable change.  Baseline: 1.3 total score (See above for individual activity scores)  Goal status: IN PROGRESS  2.  Patient will demonstrate at least 30 lbs L grip strength as needed to open jars and other containers.  Baseline: 22.4 lbs Goal status: IN Progress  3.  Patient will demonstrate improvement with nine-hole peg by at least 5 seconds bilaterally. Baseline: Right: 40 sec; Left: 38 sec Goal status: IN Progress  4.  Pt will report handwriting to be at least 75% of prior legibility/neatness. Baseline: 50% legibility Goal status: IN Progress   ASSESSMENT:  CLINICAL IMPRESSION: Pt tolerated tasks well today and benefited from v/c for upright seated posture to decrease pain, improve proprioception. Pt demo'd improved legibility with handwriting tasks today when using adapted pen and printing letters and focusing on writing larger letter. Continue education about handwriting, energy conservation strategies recommended. Pt would benefit from skilled OT  services to address deficits, increase overall independence, and return patient to PLOF as able.   PERFORMANCE DEFICITS: in functional skills including ADLs, IADLs, coordination, sensation, strength, pain, Fine motor control, mobility, endurance, decreased knowledge of precautions, decreased knowledge of use of DME, and UE functional use.  IMPAIRMENTS: are limiting patient from ADLs and IADLs.   CO-MORBIDITIES: has co-morbidities such as recent spinal surgery  that affects occupational performance. Patient will benefit from skilled OT to address above impairments and improve overall function.  REHAB POTENTIAL: Fair given chronicity of impairments   PLAN:  OT FREQUENCY: additional 2x/week  OT DURATION: additional 6 weeks  PLANNED INTERVENTIONS: self care/ADL training, therapeutic exercise, therapeutic activity, neuromuscular re-education, manual therapy, functional mobility training, electrical stimulation, fluidotherapy, moist heat, patient/family education, energy conservation, DME and/or AE instructions, and Re-evaluation  RECOMMENDED OTHER SERVICES: ST referral  CONSULTED AND AGREED WITH PLAN OF CARE: Patient  PLAN FOR NEXT SESSION:   Complete 10th Progress Note next visit  tremor reduction strategies; oval 8 splint (how is it going?)  Review, update and provide AE ideas for joint protection  Review memory Binder to collect HEP, resources etc  Practice print letters handwriting with A/E writing accessories - How is handwriting going when using "Twist n' Write" pen and printing letters? Did pt choose to order weighted pen/pencil?   Wynetta Emery, OT 11/24/2022, 12:36 PM

## 2022-11-24 NOTE — Therapy (Signed)
OUTPATIENT PHYSICAL THERAPY NEURO TREATMENT   Patient Name: Katrina Rivera MRN: [id number] DOB:01/10/1946, 77 y.o., female Today's Date: 11/25/2022   PCP: Noberto Retort, MD REFERRING PROVIDER: Charlton Amor, PA-C  END OF SESSION:  PT End of Session - 11/25/22 1452     Visit Number 13    Number of Visits 27    Date for PT Re-Evaluation 01/13/23    Authorization Type [employer]    Authorization Time Period 09-20-22 - 12-08-22; 11-21-22 - 02-07-23    Progress Note Due on Visit 10    PT Start Time 1104    PT Stop Time 1150    PT Time Calculation (min) 46 min    Equipment Utilized During Treatment Gait belt    Activity Tolerance Patient tolerated treatment well    Behavior During Therapy WFL for tasks assessed/performed                 Past Medical History:  Diagnosis Date   Addison's disease (HCC)    takes Solu Cortef daily   Anemia    takes Ferrous Sulfate daily   Anxiety    takes Xanax nightly   Arthritis    Chronic back pain    stenosis   Depression    takes Cymbalta daily   Dizziness    if b/p drops    Fibromyalgia    History of blood transfusion    no abnormal reaction noted   History of bronchitis    many yrs ago    Hypokalemia    takes Potassium daily   Hypotension    takes Florinef daily   IBS (irritable bowel syndrome)    takes Align daily   Insomnia    takes Trazodone nightly   Joint pain    Multiple sclerosis (HCC)    doesn't take any meds   Multiple sclerosis (HCC)    New onset headache 12/22/2021   Nocturia    Osteoporosis    Palpitations    Peripheral neuropathy    Primary localized osteoarthritis of left knee 08/11/2020   Restless leg syndrome    Seasonal allergies    takes Zyrtec daily;uses Flonase daily as needed   Syncope    when missed methylprednisolone dose   Urinary frequency    takes Flomax daily   Weakness    numbness and tingling   Past Surgical History:  Procedure Laterality Date   ABDOMINAL  HYSTERECTOMY  02/07/1986   APPENDECTOMY  02/07/1986   CESAREAN SECTION  1973/1977   x2   CHOLECYSTECTOMY  02/08/1995   COLECTOMY  02/08/1988   COLONOSCOPY     ESOPHAGOGASTRODUODENOSCOPY     EYE SURGERY     bilateral - /w IOL- cataracts   FRACTURE SURGERY Right    rods and screws-right leg   LUMBAR LAMINECTOMY/DECOMPRESSION MICRODISCECTOMY Left 01/24/2013   Procedure: LUMBAR FIVE TO SACRAL ONE LUMBAR LAMINECTOMY/DECOMPRESSION MICRODISCECTOMY 1 LEVEL;  Surgeon: Tia Alert, MD;  Location: [facility];  Service: Neurosurgery;  Laterality: Left;   MAXIMUM ACCESS (MAS)POSTERIOR LUMBAR INTERBODY FUSION (PLIF) 1 LEVEL N/A 06/18/2014   Procedure: MAXIMUM ACCESS SURGERY POSTERIOR LUMBAR INTERBODY FUSION LUMBAR FIVE TO SACRAL ONE ;  Surgeon: Tia Alert, MD;  Location: [facility];  Service: Neurosurgery;  Laterality: N/A;   SPINAL CORD STIMULATOR BATTERY EXCHANGE Right 07/30/2021   Procedure: Spinal cord stimulator battery replacement;  Surgeon: Tia Alert, MD;  [facility];  Service: Neurosurgery;  Laterality: Right;   SPINAL CORD STIMULATOR  IMPLANT     TOTAL KNEE ARTHROPLASTY Left 08/24/2020   Procedure: TOTAL KNEE ARTHROPLASTY;  Surgeon: Salvatore Marvel, MD;  Location: [facility];  Service: Orthopedics;  Laterality: Left;   Patient Active Problem List   Diagnosis Date Noted   Myofascial pain dysfunction syndrome 11/16/2022   Vertigo 11/16/2022   Lumbar burst fracture (HCC) 08/23/2022   Lumbar vertebral fracture (HCC) 08/19/2022   Chronic migraine w/o aura w/o status migrainosus, not intractable 03/29/2022   Mixed hyperlipidemia 12/27/2021   Chest pain of uncertain etiology 12/27/2021   New onset headache 12/22/2021   S/P lumbar fusion 07/30/2021   Primary localized osteoarthritis of left knee 08/11/2020   Atrophic vaginitis 08/11/2020   Menopausal symptom 08/11/2020   Restless leg syndrome    Chronic back pain    Low back pain without sciatica 11/01/2018   Essential  hypertension    SBO (small bowel obstruction) (HCC) 09/01/2016   Fibromyalgia    Abdominal pain, vomiting, and diarrhea    Dehydration    Somnolence, daytime 10/22/2015   Fatigue 10/22/2015   Chronic leg pain 06/29/2015   S/P lumbar spinal fusion 06/18/2014   Abnormality of gait 02/18/2013   S/P lumbar microdiscectomy 01/24/2013   Hypokalemia 12/24/2012   Sweating abnormality 10/19/2012   Dysautonomia orthostatic hypotension syndrome 08/16/2012   Hereditary and idiopathic peripheral neuropathy 08/16/2012   Chronic adrenal insufficiency (HCC) 08/16/2012   MS (multiple sclerosis) (HCC) 08/15/2012    ONSET DATE: 08-17-22  REFERRING DIAG: G35 (ICD-10-CM) - Multiple sclerosis   Diagnosis  S32.039D (ICD-10-CM) - Closed fracture of third lumbar vertebra with routine healing, unspecified fracture morphology, subsequent encounter    THERAPY DIAG:  Muscle weakness (generalized)  Unsteadiness on feet  Other abnormalities of gait and mobility  Rationale for Evaluation and Treatment: Rehabilitation  SUBJECTIVE:                                                                                                                                                                                             SUBJECTIVE STATEMENT:  Pt reports she has been walking some in her home without use of RW; states she did the new exercises added to her HEP but has a question about the turning one  Pt accompanied by: self  PERTINENT HISTORY: spinal cord stimulator implant; MS diagnosed in 1984; s/p 2 back surgeries  Per Chart note "Brief HPI:   Katrina Rivera is a 77 y.o. female with a history of relapsing remitting multiple sclerosis who was evaluated by Dr. Yetta Barre regarding gait abnormality, chronic low back pain and status post lumbar fusion May 2024. She has reportedly fallen  4 times at home after PLIF of L3-5. She was admitted 08/17/2022 for scheduled surgery of L3 spinal fracture. She underwent posterior  fixation L2-L4 using NuVasive cortical pedicle screws, arthrodesis and removal of segmental fixation L3-L5 by Dr. Yetta Barre. PT OT evaluations carried out. Incision is healed without signs of infection. She is appropriate back soreness. She has no complaints of leg pain or new numbness, tingling or weakness per Dr. Barnett Applebaum note today. Signs are stable and she is tolerating regular diet. Other past medical history includes restless leg syndrome, anxiety, headaches with migraine features, fibromyalgia, hypotension, anemia (acute on chronic)."    PAIN:  Are you having pain? Yes: NPRS scale: 7/10 Pain location: low back pain and bilateral shoulders - recent TP injections (9am 10/9)  Pain description: tightness, throbbing, sore Aggravating factors: no specific Relieving factors: Brace helps   PRECAUTIONS: Back  - No bending, lifting >10#, twisting, reaching overhead; Dr Yetta Barre responded with "regular back precautions" via inbasket message inquiring about precautions  RED FLAGS: None   WEIGHT BEARING RESTRICTIONS: No  FALLS: Has patient fallen in last 6 months? Yes. Number of falls 4  LIVING ENVIRONMENT: Lives with: lives with their spouse Lives in: House/apartment Stairs: Yes: External: 1 steps; planning on having rail installed Has following equipment at home: Walker - 2 wheeled, shower chair, and Grab bars  PLOF: Independent with household mobility with device, Requires assistive device for independence, and Needs assistance with ADLs  PATIENT GOALS: "walk without walker and back brace and get back to where I was before"  OBJECTIVE:   DIAGNOSTIC FINDINGS: IMPRESSION: 1. Unhealed fracture of the posterosuperior L3 body, ans associated avulsed appearance of both L3 pedicles (series 7, image 30) along the course of the bilateral L3 pedicle screws. Mild retropulsion of the posterior body fragment does not result in significant spinal stenosis. 2. No other acute osseous abnormality identified  in the Lumbar Spine. But scant arthrodesis limited to only the right L5-S1 posterior elements following L3 through S1 fusion. Some endplate subsidence at each level. 3.  Aortic Atherosclerosis (ICD10-I70.0).  Nephrolithiasis.  COGNITION: Overall cognitive status: Within functional limits for tasks assessed   SENSATION: Impaired sensation in arms and legs - numbness and tingling  COORDINATION: WFL's - slowed movement due to back pain  POSTURE: rounded shoulders and forward head  LOWER EXTREMITY ROM:   WFL's bil. LE's   LOWER EXTREMITY MMT:  grossly 4/5 throughout but pain with resistance due to back pain (surgical site)  BED MOBILITY:  Independent   TRANSFERS: Assistive device utilized: Environmental consultant - 4 wheeled  Sit to stand: Modified independence Stand to sit: Modified independence  STAIRS:  TBA  GAIT: Gait pattern:  increased knee flexion bil. LE's in stance  and step through pattern Distance walked: 59' Assistive device utilized: Environmental consultant - 2 wheeled Level of assistance: SBA Comments: pt amb. With bil. Knees flexed  FUNCTIONAL TESTS:  5 times sit to stand: 21.47 secs from chair without UE support with close CGA Timed up and go (TUG): 21.47 secs with RW 10 meter walk test: 21.56 = 1.52 ft/sec with RW Berg Balance Scale: to be assessed  - pt stood for 2" without UE support with SBA  PATIENT SURVEYS:  N/A - dx is MS  TODAY'S TREATMENT:        TherEx:   Tall kneeling position - with Kay bench in front for UE support; pt performed 10 reps small range mini squats for hip extension strengthening Step up exercise 10 reps each  leg onto 6" step with use of hand rails prn for assist with balance   NeuroRe-ed: Tall kneeling position - pt performed unilateral shoulder flexion to approx. 95 degrees 3 reps each; then performed bil. Shoulder flexion to approx. 95 degrees 5 reps; bil. Shoulder horizontal abduction/adduction 5 reps with CGA for core stabilization and  strengthening  Practiced weight shift/pivot turn exercise with UE support on back of chair - pt performed 5 reps to each side with UE support with SBA - to improve balance with turning  Single limb stance activities - tap ups alternating to 1 step 10 reps, to 2nd step 5 reps each foot;  touching balance bubbles (3) for targets to improve SLS on each leg with CGA  GAIT: Gait pattern: increased knee flexion bil. LE's in stance  and step through pattern Distance walked: 230' (2 laps) Assistive device utilized: None Level of assistance: CGA Comments: pt amb. With Lt knee flexed  PATIENT EDUCATION:  Education details: Continue HEP w/ addition, safe set up for banded lateral steps at home, relaxing shoulders when walking and not bearing too much weight through arms Person educated: Patient Education method: Explanation, Demonstration, Tactile cues, Verbal cues, and Handouts Education comprehension: verbalized understanding, returned demonstration, verbal cues required, tactile cues required, and needs further education  HOME EXERCISE PROGRAM: Access Code: [email or web address] URL: [email or web address] Date: 09/26/2022 Prepared by: Peter Congo  Exercises - Ankle Dorsiflexion with Resistance  - 1 x daily - 7 x weekly - 3 sets - 10 reps - Gastroc Stretch on Wall  - 1 x daily - 7 x weekly - 3 sets - 10 reps - Supine Knee Extension Stretch on Towel Roll  - 1 x daily - 7 x weekly - Supine Quad Set  - 1 x daily - 7 x weekly - 3 sets - 10 reps - Sit to Stand with Arms Crossed  - 1 x daily - 7 x weekly - 3 sets - 10 reps - Supine Bridge  - 1 x daily - 7 x weekly - 3 sets - 10 reps - Side Stepping with Resistance at Ankles and Counter Support  - 1 x daily - 7 x weekly - 3 sets - 10 reps   Access Code: [email or web address] - added 10-11-22 URL: [email or web address] Date: 10/12/2022 Prepared by: Maebelle Munroe  Exercises - Supine hamstring stretch  - 1 x daily - 7 x weekly - 1 sets - 2-3  reps - 60 sec hold  Access Code: [email or web address] URL: [email or web address] Date: 10/07/2022 Prepared by: Maebelle Munroe  Exercises - Single Leg Stance with Support  - 1 x daily - 7 x weekly - 1 sets - 2-3 reps - 10 sec hold   HEP issued: 10-04-22, updated 10-11-22 Access Code: [email or web address]:  added SLS exercise - Access Code: [email or web address] 10-06-22 URL: [email or web address] Date: 10/05/2022 Prepared by: Maebelle Munroe  Exercises - Alternating Step Forward with Support  - 1 x daily - 7 x weekly - 1 sets - 10 reps - Step Sideways  - 1 x daily - 7 x weekly - 1 sets - 10 reps - Alternating Step Backward with Support  - 1 x daily - 7 x weekly - 1 sets - 10 reps - Side Stepping with Counter Support  - 1 x daily - 7 x weekly - 1 sets - 2-3 reps - Standing March with Counter Support  - 1 x daily - 7 x weekly - 1 sets - 10 reps   GOALS: Goals reviewed with patient? Yes  NEW SHORT TERM GOALS: Target date:  12-16-22  Improve Berg score to >/= 45/56 to reduce fall risk. Baseline: 30/56 (8/19), 34/56 (9/11);  score 41/56 on 11-21-22 Goal status: Upgraded/Revised  2.  Pt will ambulate 39' with RW with SBA on flat, even surface for increased community accessibility. Baseline: 230 ft CGA no AD (9/11) Goal status: Upgraded/Revised  3.  Report decreased back pain to </= 3/10 intensity for increased ease & comfort with ADL's & mobility.  Baseline: 5/10, 6/10 (9/11) Goal status: Ongoing  4.  Improve 5x sit to stand score to </= 16 secs from chair without UE support to demo improved LE strength.  Baseline: 21.47 secs, 15.62 sec no UE (9/11);  19.68 secs on 11-21-22 Goal status: Revised  5.  Improve TUG score to </= 17.5 secs with RW to demo improved functional mobility.  Baseline: 21.47 secs with RW, 22 sec with RW (9/11); 21.12 secs on 11-21-22 Goal status: Ongoing  6.  Increase gait speed to >/= 1.9 ft/sec with RW with pt demonstrating bil. Knee extension in stance.  Baseline:  1.52 ft/sec with RW (21.56 secs), 1.52 ft/sec with RW (9/11); 1.67 ft/sec with RW (10-14) Goal status: Ongoing   UPDATED LONG TERM GOALS: Target date: 01-13-23  Improve Berg score  >/= 48/56 to reduce fall risk & demo improved balance. Baseline: 30/56 (8/19); score 41/56 on 11-21-22 Goal status: Goal met 11-21-22; UPGRADED/REVISED  2.  Pt will be modified independent with household ambulation without device. Baseline: pt reports she is walking short distances in home without use of RW, but still using RW for longer distances in the home Goal status: Partially met - 11-21-22; ONGOING  3.  Improve 5x sit to stand score to </= 14 secs from chair without UE support to demo improved LE strength.  Baseline: 21.47 secs;  19.68 secs Goal status: Goal not met 11-21-22  4.  Improve TUG score to </= 14.5 secs with RW to demo improved functional mobility.  Baseline: 21.47 secs with RW;  21.12 secs   Goal status: Not met 11-21-22; ONGOING  5.   Increase gait speed to >/= 2.3 ft/sec with RW with pt demonstrating bil. Knee extension in stance.  Baseline: 1.52 ft/sec with RW (21.56 secs); 19.62,  20.00 = 1.67 ft/sec with RW Goal status: Goal not met 11-21-22; ONGOING  6.  Independent in HEP for balance and LE strengthening and core stabilization exercises. Baseline:  Goal status: Goal met 11-21-22  7.  Pt will transfer floor to stand with UE support on mat table with SBA.  Baseline:  TBA  Goal status:  NEW  ASSESSMENT:  CLINICAL IMPRESSION:  PT session focused on core stabilization exercise with pt in tall kneeling position with UE support on Kay bench in front for assist with balance prn and on standing balance exercises and also on gait training without device.  Pt able to amb. 230' with CGA without use of RW with no LOB.  Pt is progressing well. Cont with POC.   OBJECTIVE IMPAIRMENTS: Abnormal gait, decreased activity tolerance, decreased balance, decreased strength, and pain.   ACTIVITY  LIMITATIONS: carrying, lifting, bending, squatting, stairs, transfers, reach over head, and locomotion level  PARTICIPATION LIMITATIONS: meal prep, cleaning, laundry, shopping, community activity, and pt reports she does not drive  PERSONAL FACTORS: Past/current experiences, Time since onset of injury/illness/exacerbation, and 1 comorbidity: MS and s/p back surgery  are also affecting patient's functional outcome.   REHAB POTENTIAL: Good  CLINICAL DECISION MAKING: Evolving/moderate  complexity  EVALUATION COMPLEXITY: Moderate  PLAN:  PT FREQUENCY: 2x/week  PT DURATION: 8 weeks  PLANNED INTERVENTIONS: Therapeutic exercises, Therapeutic activity, Neuromuscular re-education, Balance training, Gait training, Patient/Family education, Self Care, Stair training, DME instructions, and Aquatic Therapy  PLAN FOR NEXT SESSION: Cont gait training, LE strengthening, endurance, balance on compliant surfaces, hip abduction and flexion strengthening   Kerry Fort, PT  11/25/2022, 2:54 PM

## 2022-11-25 ENCOUNTER — Encounter: Payer: Self-pay | Admitting: Physical Therapy

## 2022-11-28 ENCOUNTER — Ambulatory Visit: Payer: PPO | Admitting: Occupational Therapy

## 2022-11-28 DIAGNOSIS — R251 Tremor, unspecified: Secondary | ICD-10-CM

## 2022-11-28 DIAGNOSIS — R278 Other lack of coordination: Secondary | ICD-10-CM

## 2022-11-28 DIAGNOSIS — M6281 Muscle weakness (generalized): Secondary | ICD-10-CM | POA: Diagnosis not present

## 2022-11-28 DIAGNOSIS — R29818 Other symptoms and signs involving the nervous system: Secondary | ICD-10-CM

## 2022-11-28 NOTE — Therapy (Addendum)
OUTPATIENT OCCUPATIONAL THERAPY NEURO TREATMENT/Progress Note  Patient Name: Katrina Rivera MRN: [id number] DOB:[birth year], 77 y.o., female Today's Date: 11/28/2022  PCP: Noberto Retort, MD  REFERRING PROVIDER: Charlton Amor, PA-C  END OF SESSION:  OT End of Session - 11/28/22 1146     Visit Number 10    Number of Visits 25    Date for OT Re-Evaluation 12/30/22    Authorization Type [employer] - follows Medicare guidelines    Progress Note Due on Visit 10    OT Start Time 1147    OT Stop Time 1225    OT Time Calculation (min) 38 min    Activity Tolerance Patient tolerated treatment well    Behavior During Therapy WFL for tasks assessed/performed             Past Medical History:  Diagnosis Date   Addison's disease (HCC)    takes Solu Cortef daily   Anemia    takes Ferrous Sulfate daily   Anxiety    takes Xanax nightly   Arthritis    Chronic back pain    stenosis   Depression    takes Cymbalta daily   Dizziness    if b/p drops    Fibromyalgia    History of blood transfusion    no abnormal reaction noted   History of bronchitis    many yrs ago    Hypokalemia    takes Potassium daily   Hypotension    takes Florinef daily   IBS (irritable bowel syndrome)    takes Align daily   Insomnia    takes Trazodone nightly   Joint pain    Multiple sclerosis (HCC)    doesn't take any meds   Multiple sclerosis (HCC)    New onset headache 12/22/2021   Nocturia    Osteoporosis    Palpitations    Peripheral neuropathy    Primary localized osteoarthritis of left knee 08/11/2020   Restless leg syndrome    Seasonal allergies    takes Zyrtec daily;uses Flonase daily as needed   Syncope    when missed methylprednisolone dose   Urinary frequency    takes Flomax daily   Weakness    numbness and tingling   Past Surgical History:  Procedure Laterality Date   ABDOMINAL HYSTERECTOMY  02/07/1986   APPENDECTOMY  02/07/1986   CESAREAN SECTION   1973/1977   x2   CHOLECYSTECTOMY  02/08/1995   COLECTOMY  02/08/1988   COLONOSCOPY     ESOPHAGOGASTRODUODENOSCOPY     EYE SURGERY     bilateral - /w IOL- cataracts   FRACTURE SURGERY Right    rods and screws-right leg   LUMBAR LAMINECTOMY/DECOMPRESSION MICRODISCECTOMY Left 01/24/2013   Procedure: LUMBAR FIVE TO SACRAL ONE LUMBAR LAMINECTOMY/DECOMPRESSION MICRODISCECTOMY 1 LEVEL;  Surgeon: Tia Alert, MD;  Location: [facility];  Service: Neurosurgery;  Laterality: Left;   MAXIMUM ACCESS (MAS)POSTERIOR LUMBAR INTERBODY FUSION (PLIF) 1 LEVEL N/A 06/18/2014   Procedure: MAXIMUM ACCESS SURGERY POSTERIOR LUMBAR INTERBODY FUSION LUMBAR FIVE TO SACRAL ONE ;  Surgeon: Tia Alert, MD;  Location: [facility];  Service: Neurosurgery;  Laterality: N/A;   SPINAL CORD STIMULATOR BATTERY EXCHANGE Right 07/30/2021   Procedure: Spinal cord stimulator battery replacement;  Surgeon: Tia Alert, MD;  Location: [facility];  Service: Neurosurgery;  Laterality: Right;   SPINAL CORD STIMULATOR IMPLANT     TOTAL KNEE ARTHROPLASTY Left 08/24/2020   Procedure: TOTAL KNEE ARTHROPLASTY;  Surgeon: Salvatore Marvel, MD;  Location: [facility];  Service: Orthopedics;  Laterality: Left;   Patient Active Problem List   Diagnosis Date Noted   Myofascial pain dysfunction syndrome 11/16/2022   Vertigo 11/16/2022   Lumbar burst fracture (HCC) 08/23/2022   Lumbar vertebral fracture (HCC) 08/19/2022   Chronic migraine w/o aura w/o status migrainosus, not intractable 03/29/2022   Mixed hyperlipidemia 12/27/2021   Chest pain of uncertain etiology 12/27/2021   New onset headache 12/22/2021   S/P lumbar fusion 07/30/2021   Primary localized osteoarthritis of left knee 08/11/2020   Atrophic vaginitis 08/11/2020   Menopausal symptom 08/11/2020   Restless leg syndrome    Chronic back pain    Low back pain without sciatica 11/01/2018   Essential hypertension    SBO (small bowel obstruction) (HCC) 09/01/2016   Fibromyalgia     Abdominal pain, vomiting, and diarrhea    Dehydration    Somnolence, daytime 10/22/2015   Fatigue 10/22/2015   Chronic leg pain 06/29/2015   S/P lumbar spinal fusion 06/18/2014   Abnormality of gait 02/18/2013   S/P lumbar microdiscectomy 01/24/2013   Hypokalemia 12/24/2012   Sweating abnormality 10/19/2012   Dysautonomia orthostatic hypotension syndrome 08/16/2012   Hereditary and idiopathic peripheral neuropathy 08/16/2012   Chronic adrenal insufficiency (HCC) 08/16/2012   MS (multiple sclerosis) (HCC) 08/15/2012    ONSET DATE: 08/24/2022 (date of referral)  REFERRING DIAG: G35 (ICD-10-CM) - Multiple sclerosis  THERAPY DIAG:  Other lack of coordination  Other symptoms and signs involving the nervous system  Tremors of nervous system  Rationale for Evaluation and Treatment: Rehabilitation  SUBJECTIVE:   SUBJECTIVE STATEMENT: No changes in medication and no recent falls. Pt reports receiving weighted pen in mail today and requested assistance with putting weighted pen on correctly. Pt reports having oval-8 splint available though not currently using it.  Pt accompanied by: self  PERTINENT HISTORY: MS, addison's disease, anemia, anxiety, arthritis, chronic back pain, depression, fibromyalgia, hypotension, IBS, peripheral neuropathy, syncope, spinal cord stimulator, and prior back surgery. Pt underwent fusion of L2-4 and removal of segmental fixation of L3-5 to manage L3 spinal fx sustained during a fall (7/10 date of surgery).   PRECAUTIONS: Fall and Other: Pt recently had back surgery, no precautions reported or listed; Brace for back to be donned when up and moving; no lifting over 10 lbs  - contacted ortho for clarification  WEIGHT BEARING RESTRICTIONS: No  PAIN:  Are you having pain? Yes: NPRS scale: 7/10 Pain location: worse in low back Pain description: aching Aggravating factors: near incision site on low back,  Relieving factors: rest; scheduled medication  every 6 hours   FALLS: Has patient fallen in last 6 months? Yes. Number of falls 4 including fall with lumbar fracture while reaching for pencil on the floor.   LIVING ENVIRONMENT: Lives with: lives with their spouse Lives in: House/apartment Stairs: Yes: External: 1 steps; none Has following equipment at home: Single point cane, Walker - 2 wheeled, Environmental consultant - 4 wheeled, Wheelchair (manual), shower chair, bed side commode, and toilet riser  PLOF: Independent; has not driven recently; retired from [employer]  PATIENT GOALS: Improve B hand function, especially R hand  OBJECTIVE:   HAND DOMINANCE: Right  ADLs: Overall ADLs: mostly mod I Eating: husband cuts food for her, which is baseline LB Dressing: min A for donning shoes Bathing: husband cleans back  Tub Shower transfers: min A  IADLs: Shopping: mod A Light housekeeping: doesn't vacuum at baseline; mod A  Community mobility: SBA Handwriting: 50% legible  MOBILITY STATUS: Needs Assist: SBA with RW  ACTIVITY TOLERANCE: Activity tolerance: fair  FUNCTIONAL OUTCOME MEASURES: PSFS:  Total score = sum of the activity scores/number of activities Minimum detectable change (90%CI) for average score = 2 points Minimum detectable change (90%CI) for single activity score = 3 points    11/28/22 update: PSFS = 2.7   UPPER EXTREMITY ROM:    BUE WNL; however opposition B impaired  UPPER EXTREMITY MMT:     BUE elbow ext and flex WFL  HAND FUNCTION: Grip strength: Right: 34.6 lbs; Left: 22.4 lbs  COORDINATION: 9 Hole Peg test: Right: 40 sec; Left: 38 sec  SENSATION: Light touch: Impaired  and hx of neuropathy; mainly in R index finger  EDEMA: none reported or observed  MUSCLE TONE: WNL  COGNITION: Overall cognitive status: Within functional limits for tasks assessed  VISION: Subjective report: changes in acuities Baseline vision: Wears glasses for reading only and will be getting eye exam Visual history:  glaucoma and cataracts  VISION ASSESSMENT: WFL - to read clock on wall  PERCEPTION: Impaired: proprioception  PRAXIS: WFL  OBSERVATIONS: Pt appears well kept. She has RW with walker bag. Requires SBA for ambulation.    TODAY'S TREATMENT:                                                                                                                               Therapeutic Activities OT assessed progress towards goals. See goals section below for details and PSFS objective measure above for updates.  Handwriting to improve FM coordination, dexterity, increase legibility - OT educated pt on weighted pencil materials, pencil grip, and twist n' write pen grasp, and positioning. Pt verbalized and demo'd understanding. Pt practiced handwriting on single lined paper with adapted handwriting utensils, including twist n' write pen, weighted pencil with tripod pencil grip, standard pencil with tripod pencil grip, standard pen with weight. Pt demo'd improved legibility with weighted standard pen, twist n' write pen, and pencil with tripod grip (approx. 90 to 100% legibility when printing). V/c to erase errors with pencil PRN. Pt verbalized and demo'd strategies from previous session, including write larger and use print letters, indicating carryover of education.  TherEx Flex bar (yellow) - to increase BUE grip strength - x10 reps, 2 sets:  wrist supination, wrist pronation, twist RUE then LUE   PATIENT EDUCATION: Education details: See Today's Treatment above Person educated: Patient Education method: Explanation, Demonstration, and Verbal cues Education comprehension: verbalized understanding, returned demonstration, verbal cues required, and needs further education  HOME EXERCISE PROGRAM: 10/12/2022: Coordination and yellow putty HEPs 10/19/2022: WARM memory strategy 11/21/22: B Shoulder ROM Access Code: [email or web address] 11/24/22: Energy conservation and deep breathing (handouts), print (not cursive)  handwriting with big letters (no handout)  GOALS:  SHORT TERM GOALS: Target date: 10/18/2022    Patient will demonstrate independence with initial HEP. Baseline: Goal status: MET  2.  Patient will independently verbalize at least 3 energy conservation  principles in relation to ADLs to increase functional independence.  Baseline:  Goal status: 11/28/22 - MET 11/28/22 - pt verbalized x3 energy conservation strategies   LONG TERM GOALS: Target date: 12/30/2022    Patient will report at least two-point increase in average PSFS score or at least three-point increase in a single activity score indicating functionally significant improvement given minimum detectable change.  Baseline: 1.3 total score (See above for individual activity scores)  Goal status: IN PROGRESS 11/28/22 - PSFS = 2.7 total score (see above for individual activity scores)  2.  Patient will demonstrate at least 30 lbs L grip strength as needed to open jars and other containers.  Baseline: 22.4 lbs Goal status: IN Progress 11/28/22 - 27.5 lbs LUE   3.  Patient will demonstrate improvement with nine-hole peg by at least 5 seconds bilaterally. Baseline: Right: 40 sec; Left: 38 sec Goal status: 11/28/22 - MET 11/28/22 - Right: 33 seconds; Left: 32 seconds  4.  Pt will report handwriting to be at least 75% of prior legibility/neatness. Baseline: 50% legibility Goal status: 11/28/22- MET 11/28/22 - Weighted pen, copied 1 sentence with approx. 90% legibility (printed letters). Twist n' write pen - copied 1 sentence with 100% legibility (printed letters).   ASSESSMENT:  CLINICAL IMPRESSION:  This 10th progress note is for dates: 09/20/22 to 11/28/2022. Pt has met 2 of 2 STGs and 2 of 4 LTGs. Pt also demo'd improved grip strength and is making good progress towards remaining goals. Pt making progress towards goals as expected and continues to benefit from skilled OT services in the outpatient setting to work  towards remaining goals or until max rehab potential is met.   Pt tolerated tasks well today. Pt benefits from adaptive handwriting strategies and printed handwriting to improve legibility. Pt would continue to benefit from skilled OT services to address deficits, increase overall independence, and return patient to PLOF as able.   PERFORMANCE DEFICITS: in functional skills including ADLs, IADLs, coordination, sensation, strength, pain, Fine motor control, mobility, endurance, decreased knowledge of precautions, decreased knowledge of use of DME, and UE functional use.  IMPAIRMENTS: are limiting patient from ADLs and IADLs.   CO-MORBIDITIES: has co-morbidities such as recent spinal surgery  that affects occupational performance. Patient will benefit from skilled OT to address above impairments and improve overall function.  REHAB POTENTIAL: Fair given chronicity of impairments   PLAN:  OT FREQUENCY: additional 2x/week  OT DURATION: additional 6 weeks  PLANNED INTERVENTIONS: self care/ADL training, therapeutic exercise, therapeutic activity, neuromuscular re-education, manual therapy, functional mobility training, electrical stimulation, fluidotherapy, moist heat, patient/family education, energy conservation, DME and/or AE instructions, and Re-evaluation  RECOMMENDED OTHER SERVICES: ST referral  CONSULTED AND AGREED WITH PLAN OF CARE: Patient  PLAN FOR NEXT SESSION:   FM coordination and BUE grip strengthening tasks  tremor reduction strategies; oval 8 splint (how is it going?)  Review, update and provide AE ideas for joint protection  Review memory Binder to collect HEP, resources etc  Review handwriting A/E PRN - continue to encourage printed handwriting d/t improved legibility compared to cursive handwriting   Wynetta Emery, OT 11/28/2022, 12:40 PM

## 2022-11-29 ENCOUNTER — Other Ambulatory Visit (HOSPITAL_COMMUNITY): Payer: Self-pay | Admitting: Neurological Surgery

## 2022-11-29 DIAGNOSIS — S32039A Unspecified fracture of third lumbar vertebra, initial encounter for closed fracture: Secondary | ICD-10-CM | POA: Diagnosis not present

## 2022-11-29 DIAGNOSIS — S32039D Unspecified fracture of third lumbar vertebra, subsequent encounter for fracture with routine healing: Secondary | ICD-10-CM | POA: Diagnosis not present

## 2022-11-30 ENCOUNTER — Ambulatory Visit: Payer: PPO | Admitting: Occupational Therapy

## 2022-11-30 DIAGNOSIS — R278 Other lack of coordination: Secondary | ICD-10-CM

## 2022-11-30 DIAGNOSIS — M6281 Muscle weakness (generalized): Secondary | ICD-10-CM

## 2022-11-30 DIAGNOSIS — R29818 Other symptoms and signs involving the nervous system: Secondary | ICD-10-CM

## 2022-11-30 DIAGNOSIS — G35 Multiple sclerosis: Secondary | ICD-10-CM

## 2022-11-30 DIAGNOSIS — R251 Tremor, unspecified: Secondary | ICD-10-CM

## 2022-11-30 NOTE — Therapy (Signed)
OUTPATIENT OCCUPATIONAL THERAPY NEURO TREATMENT DISCHARGE  Patient Name: Katrina Rivera MRN: [id number] DOB:[birth year], 77 y.o., female Today's Date: 11/30/2022  OCCUPATIONAL THERAPY DISCHARGE SUMMARY  Visits from Start of Care: 11  Current functional level related to goals / functional outcomes: Patient has met 2/2 short-term goals and 4/4 long-term goals to date.   Remaining deficits: PT still experiencing limitations with mobility and cognition, which is still being addressed by PT and ST.    Education / Equipment: Continue with BUE HEP as needed to maintain current functional level following OT d/c.   Patient agrees to discharge. Patient goals were met. Patient is being discharged due to meeting the stated rehab goals.Marland Kitchen   PCP: Noberto Retort, MD  REFERRING PROVIDER: Charlton Amor, PA-C  END OF SESSION:  OT End of Session - 11/30/22 1106     Visit Number 11    Number of Visits 25    Date for OT Re-Evaluation 12/30/22    Authorization Type [employer] - follows Medicare guidelines    Progress Note Due on Visit 10    OT Start Time 1105    OT Stop Time 1135    OT Time Calculation (min) 30 min    Activity Tolerance Patient tolerated treatment well    Behavior During Therapy WFL for tasks assessed/performed             Past Medical History:  Diagnosis Date   Addison's disease (HCC)    takes Solu Cortef daily   Anemia    takes Ferrous Sulfate daily   Anxiety    takes Xanax nightly   Arthritis    Chronic back pain    stenosis   Depression    takes Cymbalta daily   Dizziness    if b/p drops    Fibromyalgia    History of blood transfusion    no abnormal reaction noted   History of bronchitis    many yrs ago    Hypokalemia    takes Potassium daily   Hypotension    takes Florinef daily   IBS (irritable bowel syndrome)    takes Align daily   Insomnia    takes Trazodone nightly   Joint pain    Multiple sclerosis (HCC)    doesn't  take any meds   Multiple sclerosis (HCC)    New onset headache 12/22/2021   Nocturia    Osteoporosis    Palpitations    Peripheral neuropathy    Primary localized osteoarthritis of left knee 08/11/2020   Restless leg syndrome    Seasonal allergies    takes Zyrtec daily;uses Flonase daily as needed   Syncope    when missed methylprednisolone dose   Urinary frequency    takes Flomax daily   Weakness    numbness and tingling   Past Surgical History:  Procedure Laterality Date   ABDOMINAL HYSTERECTOMY  02/07/1986   APPENDECTOMY  02/07/1986   CESAREAN SECTION  1973/1977   x2   CHOLECYSTECTOMY  02/08/1995   COLECTOMY  02/08/1988   COLONOSCOPY     ESOPHAGOGASTRODUODENOSCOPY     EYE SURGERY     bilateral - /w IOL- cataracts   FRACTURE SURGERY Right    rods and screws-right leg   LUMBAR LAMINECTOMY/DECOMPRESSION MICRODISCECTOMY Left 01/24/2013   Procedure: LUMBAR FIVE TO SACRAL ONE LUMBAR LAMINECTOMY/DECOMPRESSION MICRODISCECTOMY 1 LEVEL;  Surgeon: Tia Alert, MD;  Location: [facility];  Service: Neurosurgery;  Laterality: Left;   MAXIMUM ACCESS (MAS)POSTERIOR LUMBAR INTERBODY FUSION (  PLIF) 1 LEVEL N/A 06/18/2014   Procedure: MAXIMUM ACCESS SURGERY POSTERIOR LUMBAR INTERBODY FUSION LUMBAR FIVE TO SACRAL ONE ;  Surgeon: Tia Alert, MD;  Location: [facility];  Service: Neurosurgery;  Laterality: N/A;   SPINAL CORD STIMULATOR BATTERY EXCHANGE Right 07/30/2021   Procedure: Spinal cord stimulator battery replacement;  Surgeon: Tia Alert, MD;  Location: [facility] OR;  Service: Neurosurgery;  Laterality: Right;   SPINAL CORD STIMULATOR IMPLANT     TOTAL KNEE ARTHROPLASTY Left 08/24/2020   Procedure: TOTAL KNEE ARTHROPLASTY;  Surgeon: Salvatore Marvel, MD;  Location: [facility];  Service: Orthopedics;  Laterality: Left;   Patient Active Problem List   Diagnosis Date Noted   Myofascial pain dysfunction syndrome 11/16/2022   Vertigo 11/16/2022   Lumbar burst fracture (HCC) 08/23/2022    Lumbar vertebral fracture (HCC) 08/19/2022   Chronic migraine w/o aura w/o status migrainosus, not intractable 03/29/2022   Mixed hyperlipidemia 12/27/2021   Chest pain of uncertain etiology 12/27/2021   New onset headache 12/22/2021   S/P lumbar fusion 07/30/2021   Primary localized osteoarthritis of left knee 08/11/2020   Atrophic vaginitis 08/11/2020   Menopausal symptom 08/11/2020   Restless leg syndrome    Chronic back pain    Low back pain without sciatica 11/01/2018   Essential hypertension    SBO (small bowel obstruction) (HCC) 09/01/2016   Fibromyalgia    Abdominal pain, vomiting, and diarrhea    Dehydration    Somnolence, daytime 10/22/2015   Fatigue 10/22/2015   Chronic leg pain 06/29/2015   S/P lumbar spinal fusion 06/18/2014   Abnormality of gait 02/18/2013   S/P lumbar microdiscectomy 01/24/2013   Hypokalemia 12/24/2012   Sweating abnormality 10/19/2012   Dysautonomia orthostatic hypotension syndrome 08/16/2012   Hereditary and idiopathic peripheral neuropathy 08/16/2012   Chronic adrenal insufficiency (HCC) 08/16/2012   MS (multiple sclerosis) (HCC) 08/15/2012    ONSET DATE: 08/24/2022 (date of referral)  REFERRING DIAG: G35 (ICD-10-CM) - Multiple sclerosis  THERAPY DIAG:  Other lack of coordination  Other symptoms and signs involving the nervous system  Tremors of nervous system  Muscle weakness (generalized)  MS (multiple sclerosis) (HCC)  Rationale for Evaluation and Treatment: Rehabilitation  SUBJECTIVE:   SUBJECTIVE STATEMENT: Pt reports she cannot hold the Pen Again correctly.   Pt accompanied by: self  PERTINENT HISTORY: MS, addison's disease, anemia, anxiety, arthritis, chronic back pain, depression, fibromyalgia, hypotension, IBS, peripheral neuropathy, syncope, spinal cord stimulator, and prior back surgery. Pt underwent fusion of L2-4 and removal of segmental fixation of L3-5 to manage L3 spinal fx sustained during a fall (7/10 date of  surgery).   PRECAUTIONS: Fall and Other: Pt recently had back surgery, no precautions reported or listed; Brace for back to be donned when up and moving; no lifting over 10 lbs  - contacted ortho for clarification  WEIGHT BEARING RESTRICTIONS: No  PAIN:  Are you having pain? Yes: NPRS scale: 7/10 Pain location: worse in low back Pain description: aching Aggravating factors: near incision site on low back,  Relieving factors: rest; scheduled medication every 6 hours   FALLS: Has patient fallen in last 6 months? Yes. Number of falls 4 including fall with lumbar fracture while reaching for pencil on the floor.   LIVING ENVIRONMENT: Lives with: lives with their spouse Lives in: House/apartment Stairs: Yes: External: 1 steps; none Has following equipment at home: Single point cane, Walker - 2 wheeled, Environmental consultant - 4 wheeled, Wheelchair (manual), shower chair, bed side commode, and toilet riser  PLOF: Independent; has not driven recently; retired from [employer]  PATIENT GOALS: Improve B hand function, especially R hand  OBJECTIVE:   HAND DOMINANCE: Right  ADLs: Overall ADLs: mostly mod I Eating: husband cuts food for her, which is baseline LB Dressing: min A for donning shoes Bathing: husband cleans back  Tub Shower transfers: min A  IADLs: Shopping: mod A Light housekeeping: doesn't vacuum at baseline; mod A  Community mobility: SBA Handwriting: 50% legible  MOBILITY STATUS: Needs Assist: SBA with RW  ACTIVITY TOLERANCE: Activity tolerance: fair  FUNCTIONAL OUTCOME MEASURES: PSFS:  Total score = sum of the activity scores/number of activities Minimum detectable change (90%CI) for average score = 2 points Minimum detectable change (90%CI) for single activity score = 3 points    11/28/22 update: PSFS = 2.7   UPPER EXTREMITY ROM:    BUE WNL; however opposition B impaired  UPPER EXTREMITY MMT:     BUE elbow ext and flex WFL  HAND FUNCTION: Grip strength:  Right: 34.6 lbs; Left: 22.4 lbs  COORDINATION: 9 Hole Peg test: Right: 40 sec; Left: 38 sec  SENSATION: Light touch: Impaired  and hx of neuropathy; mainly in R index finger  EDEMA: none reported or observed  MUSCLE TONE: WNL  COGNITION: Overall cognitive status: Within functional limits for tasks assessed  VISION: Subjective report: changes in acuities Baseline vision: Wears glasses for reading only and will be getting eye exam Visual history: glaucoma and cataracts  VISION ASSESSMENT: WFL - to read clock on wall  PERCEPTION: Impaired: proprioception  PRAXIS: WFL  OBSERVATIONS: Pt appears well kept. She has RW with walker bag. Requires SBA for ambulation.    TODAY'S TREATMENT:                                                                                                                               - Self-care/home management completed for duration as noted below including: Objective measures assessed as noted in Goals section to determine progression towards goals. Therapist reviewed goals with patient and updated patient progression.  No additional functional limitations identified. OT completed repeat education with respect to use of Pen Again per subjective. Pt demonstrating independent use following instruction. Picture of correct donning provided to promote carryover.   PATIENT EDUCATION: Education details: See Today's Treatment above + OT d/c Person educated: Patient Education method: Explanation, Demonstration, Verbal cues, and Handouts Education comprehension: verbalized understanding and returned demonstration  HOME EXERCISE PROGRAM: 10/12/2022: Coordination and yellow putty HEPs 10/19/2022: WARM memory strategy 11/21/22: B Shoulder ROM Access Code: [email or web address] 11/24/22: Energy conservation and deep breathing (handouts), print (not cursive) handwriting with big letters (no handout)  GOALS:  SHORT TERM GOALS: Target date: 10/18/2022    Patient will  demonstrate independence with initial HEP. Baseline: Goal status: MET  2.  Patient will independently verbalize at least 3 energy conservation principles in relation to ADLs to increase functional independence.  Baseline:  Goal status: 11/28/22 -  MET 11/28/22 - pt verbalized x3 energy conservation strategies   LONG TERM GOALS: Target date: 12/30/2022    Patient will report at least two-point increase in average PSFS score or at least three-point increase in a single activity score indicating functionally significant improvement given minimum detectable change.  Baseline: 1.3 total score (See above for individual activity scores)  Goal status: MET 11/28/22 - PSFS = 2.7 total score (see above for individual activity scores)  2.  Patient will demonstrate at least 30 lbs L grip strength as needed to open jars and other containers.  Baseline: 22.4 lbs Goal status: MET 11/28/22 - 27.5 lbs LUE 11/30/2022: 36.5 lbs   3.  Patient will demonstrate improvement with nine-hole peg by at least 5 seconds bilaterally. Baseline: Right: 40 sec; Left: 38 sec Goal status: 11/28/22 - MET 11/28/22 - Right: 33 seconds; Left: 32 seconds  4.  Pt will report handwriting to be at least 75% of prior legibility/neatness. Baseline: 50% legibility Goal status: 11/28/22- MET 11/28/22 - Weighted pen, copied 1 sentence with approx. 90% legibility (printed letters). Twist n' write pen - copied 1 sentence with 100% legibility (printed letters).   ASSESSMENT:  CLINICAL IMPRESSION: Patient is appropriate for discharge and no longer demonstrates medical necessity for continued skilled occupational services. She understands her HEPs sufficiently to complete outside of therapy for further remediation efforts as needed and has met all goals.    PERFORMANCE DEFICITS: in functional skills including ADLs, IADLs, coordination, sensation, strength, pain, Fine motor control, mobility, endurance, decreased knowledge of  precautions, decreased knowledge of use of DME, and UE functional use.  IMPAIRMENTS: are limiting patient from ADLs and IADLs.   CO-MORBIDITIES: has co-morbidities such as recent spinal surgery  that affects occupational performance. Patient will benefit from skilled OT to address above impairments and improve overall function.  REHAB POTENTIAL: Fair given chronicity of impairments   PLAN:  OT D/C Completed   Delana Meyer, OT 11/30/2022, 1:56 PM

## 2022-12-01 DIAGNOSIS — N281 Cyst of kidney, acquired: Secondary | ICD-10-CM | POA: Diagnosis not present

## 2022-12-01 DIAGNOSIS — R3914 Feeling of incomplete bladder emptying: Secondary | ICD-10-CM | POA: Diagnosis not present

## 2022-12-05 DIAGNOSIS — Z1231 Encounter for screening mammogram for malignant neoplasm of breast: Secondary | ICD-10-CM | POA: Diagnosis not present

## 2022-12-05 DIAGNOSIS — Z01419 Encounter for gynecological examination (general) (routine) without abnormal findings: Secondary | ICD-10-CM | POA: Diagnosis not present

## 2022-12-06 ENCOUNTER — Encounter: Payer: Self-pay | Admitting: Physical Therapy

## 2022-12-06 ENCOUNTER — Ambulatory Visit (HOSPITAL_COMMUNITY)
Admission: RE | Admit: 2022-12-06 | Discharge: 2022-12-06 | Disposition: A | Discharge status: Home / Self Care | Payer: PPO | Source: Ambulatory Visit | Attending: Neurological Surgery | Admitting: Neurological Surgery

## 2022-12-06 ENCOUNTER — Encounter: Payer: Self-pay | Admitting: Speech Pathology

## 2022-12-06 ENCOUNTER — Ambulatory Visit: Payer: PPO | Admitting: Speech Pathology

## 2022-12-06 ENCOUNTER — Encounter: Payer: PPO | Admitting: Occupational Therapy

## 2022-12-06 ENCOUNTER — Ambulatory Visit: Payer: PPO | Admitting: Physical Therapy

## 2022-12-06 DIAGNOSIS — S32039D Unspecified fracture of third lumbar vertebra, subsequent encounter for fracture with routine healing: Secondary | ICD-10-CM | POA: Diagnosis not present

## 2022-12-06 DIAGNOSIS — M5126 Other intervertebral disc displacement, lumbar region: Secondary | ICD-10-CM | POA: Diagnosis not present

## 2022-12-06 DIAGNOSIS — R41841 Cognitive communication deficit: Secondary | ICD-10-CM

## 2022-12-06 DIAGNOSIS — M6281 Muscle weakness (generalized): Secondary | ICD-10-CM | POA: Diagnosis not present

## 2022-12-06 DIAGNOSIS — I700 Atherosclerosis of aorta: Secondary | ICD-10-CM | POA: Diagnosis not present

## 2022-12-06 DIAGNOSIS — R2689 Other abnormalities of gait and mobility: Secondary | ICD-10-CM

## 2022-12-06 DIAGNOSIS — R2681 Unsteadiness on feet: Secondary | ICD-10-CM

## 2022-12-06 NOTE — Therapy (Signed)
OUTPATIENT SPEECH LANGUAGE PATHOLOGY EVALUATION   Patient Name: Katrina Rivera MRN: [id number] DOB:[birth year], 77 y.o., female Today's Date: 12/06/2022  PCP: Noberto Retort, MD REFERRING PROVIDER: [name], MD  END OF SESSION:  End of Session - 12/06/22 1107     Visit Number 1    Number of Visits 17    Date for SLP Re-Evaluation 02/14/23   to accomodate scheduling   Authorization Type [employer]    Progress Note Due on Visit 10    SLP Start Time 0930    SLP Stop Time  1017    SLP Time Calculation (min) 47 min    Activity Tolerance Patient tolerated treatment well             Past Medical History:  Diagnosis Date   Addison's disease (HCC)    takes Solu Cortef daily   Anemia    takes Ferrous Sulfate daily   Anxiety    takes Xanax nightly   Arthritis    Chronic back pain    stenosis   Depression    takes Cymbalta daily   Dizziness    if b/p drops    Fibromyalgia    History of blood transfusion    no abnormal reaction noted   History of bronchitis    many yrs ago    Hypokalemia    takes Potassium daily   Hypotension    takes Florinef daily   IBS (irritable bowel syndrome)    takes Align daily   Insomnia    takes Trazodone nightly   Joint pain    Multiple sclerosis (HCC)    doesn't take any meds   Multiple sclerosis (HCC)    New onset headache 12/22/2021   Nocturia    Osteoporosis    Palpitations    Peripheral neuropathy    Primary localized osteoarthritis of left knee 08/11/2020   Restless leg syndrome    Seasonal allergies    takes Zyrtec daily;uses Flonase daily as needed   Syncope    when missed methylprednisolone dose   Urinary frequency    takes Flomax daily   Weakness    numbness and tingling   Past Surgical History:  Procedure Laterality Date   ABDOMINAL HYSTERECTOMY  02/07/1986   APPENDECTOMY  02/07/1986   CESAREAN SECTION  1973/1977   x2   CHOLECYSTECTOMY  02/08/1995   COLECTOMY  02/08/1988   COLONOSCOPY      ESOPHAGOGASTRODUODENOSCOPY     EYE SURGERY     bilateral - /w IOL- cataracts   FRACTURE SURGERY Right    rods and screws-right leg   LUMBAR LAMINECTOMY/DECOMPRESSION MICRODISCECTOMY Left 01/24/2013   Procedure: LUMBAR FIVE TO SACRAL ONE LUMBAR LAMINECTOMY/DECOMPRESSION MICRODISCECTOMY 1 LEVEL;  Surgeon: Tia Alert, MD;  Location: [facility];  Service: Neurosurgery;  Laterality: Left;   MAXIMUM ACCESS (MAS)POSTERIOR LUMBAR INTERBODY FUSION (PLIF) 1 LEVEL N/A 06/18/2014   Procedure: MAXIMUM ACCESS SURGERY POSTERIOR LUMBAR INTERBODY FUSION LUMBAR FIVE TO SACRAL ONE ;  Surgeon: Tia Alert, MD;  Location: [facility];  Service: Neurosurgery;  Laterality: N/A;   SPINAL CORD STIMULATOR BATTERY EXCHANGE Right 07/30/2021   Procedure: Spinal cord stimulator battery replacement;  Surgeon: Tia Alert, MD;  Location: [facility];  Service: Neurosurgery;  Laterality: Right;   SPINAL CORD STIMULATOR IMPLANT     TOTAL KNEE ARTHROPLASTY Left 08/24/2020   Procedure: TOTAL KNEE ARTHROPLASTY;  Surgeon: Salvatore Marvel, MD;  Location: [facility];  Service: Orthopedics;  Laterality: Left;   Patient  Active Problem List   Diagnosis Date Noted   Myofascial pain dysfunction syndrome 11/16/2022   Vertigo 11/16/2022   Lumbar burst fracture (HCC) 08/23/2022   Lumbar vertebral fracture (HCC) 08/19/2022   Chronic migraine w/o aura w/o status migrainosus, not intractable 03/29/2022   Mixed hyperlipidemia 12/27/2021   Chest pain of uncertain etiology 12/27/2021   New onset headache 12/22/2021   S/P lumbar fusion 07/30/2021   Primary localized osteoarthritis of left knee 08/11/2020   Atrophic vaginitis 08/11/2020   Menopausal symptom 08/11/2020   Restless leg syndrome    Chronic back pain    Low back pain without sciatica 11/01/2018   Essential hypertension    SBO (small bowel obstruction) (HCC) 09/01/2016   Fibromyalgia    Abdominal pain, vomiting, and diarrhea    Dehydration    Somnolence, daytime 10/22/2015    Fatigue 10/22/2015   Chronic leg pain 06/29/2015   S/P lumbar spinal fusion 06/18/2014   Abnormality of gait 02/18/2013   S/P lumbar microdiscectomy 01/24/2013   Hypokalemia 12/24/2012   Sweating abnormality 10/19/2012   Dysautonomia orthostatic hypotension syndrome 08/16/2012   Hereditary and idiopathic peripheral neuropathy 08/16/2012   Chronic adrenal insufficiency (HCC) 08/16/2012   MS (multiple sclerosis) (HCC) 08/15/2012    ONSET DATE: referred 11/17/2022   REFERRING DIAG: R41.89 (ICD-10-CM) - Impaired cognition  THERAPY DIAG:  Cognitive communication deficit  Rationale for Evaluation and Treatment: Rehabilitation  SUBJECTIVE:   SUBJECTIVE STATEMENT: "I used to read 100 books a year but its hard now" Pt accompanied by: self  PERTINENT HISTORY: MS, addison's disease, anemia, anxiety, arthritis, chronic back pain, depression, fibromyalgia, hypotension, IBS, peripheral neuropathy, syncope, spinal cord stimulator, and prior back surgery.   PAIN:  Are you having pain? Yes: NPRS scale: 8/10 Pain location: lower back Pain description: consistent pain from surgery Aggravating factors: from surgery Relieving factors: none  FALLS: Has patient fallen in last 6 months?  Yes, See PT evaluation for details  LIVING ENVIRONMENT: Lives with: lives with their family Lives in: House/apartment  PLOF:  Level of assistance: Independent with ADLs, Independent with IADLs Employment: Retired  PATIENT GOALS: To improve memory and reading skills  OBJECTIVE:  Note: Objective measures were completed at Evaluation unless otherwise noted.  COGNITION: Overall cognitive status: Impaired Areas of impairment:  Attention: Impaired: Focused, Sustained, Selective, Alternating, Divided Memory: Impaired: Working Teacher, music term Prospective Awareness: Impaired: Emergent and Anticipatory Executive function: Impaired: Problem solving, Organization, Planning, Error awareness, Self-correction, and  Slow processing Functional deficits: challenges with focusing and understanding while reading, forgetting conversations, forgetting where she places items, getting distracted while completing tasks, difficulty putting thoughts together, losing train of thought in conversation  COGNITIVE COMMUNICATION: Auditory comprehension: WFL Verbal expression: Impaired: reports occasional word finding difficulty Functional communication: Impaired: reports losing train of thought during conversations    ORAL MOTOR EXAMINATION: Overall status: Did not assess  STANDARDIZED ASSESSMENTS: CLQT: Memory: Mild, Language: Mild, and Clock Drawing: Moderate Attention, Executive Function, and Visuospatial Skills: will finish at initial session  PATIENT REPORTED OUTCOME MEASURES (PROM): Did not assess due to time constraints   TODAY'S TREATMENT:  DATE:   12/06/22: eval only  PATIENT EDUCATION: Education details: eval results Person educated: Patient Education method: Explanation Education comprehension: verbalized understanding   GOALS: Goals reviewed with patient? Yes  SHORT TERM GOALS: Target date: 01/17/2023  Pt will complete cognitive assessment and PROM initial therapy session Baseline: Goal status: INITIAL  2.  Pt will verbalize compensations and strategies with mod-I to support increased competency in x3 home or social tasks Baseline:  Goal status: INITIAL  3.  Pt will demonstrate use of strategies/compensations for expressive communication during structured language task with mod-A Baseline:  Goal status: INITIAL  4.  Pt will complete clinical swallow and voice evaluation, if indicated per pt report Baseline:  Goal status: INITIAL   LONG TERM GOALS: Target date: 02/14/2023  Pt will report improvement via PROM by dc Baseline:  Goal status:  INITIAL  2.  Pt will verbalize x5 functional improvements through use of cognition strategies/compensations  Baseline:  Goal status: INITIAL  3.  Pt will demonstrate use of strategies/compensations for expressive communication during discourse level language task or 10 minute unstructured conversation Baseline:  Goal status: INITIAL  ASSESSMENT:  CLINICAL IMPRESSION: Patient is a 77 y.o. female who was seen today for cognitive linguistic evaluation d/t concern for cognitive changes. Evaluation reveals mild, moderate attention, memory, awareness, and executive functioning. Pt reports concerns with memory, reading (being able to focus, comprehension of material), and expressive language. Pt is forgetting to completed intended tasks, is not driving, forgetting passwords, forgetting where to put things or information from conversations. Pt tells SLP she loses her place in conversations, forgets what to say, and has word finding difficulties for common words. Initated evaluation using CLQT, pt demonstrating impairments in memory and language. Clock drawing c/b challenges in spacing of #s (awareness following, does not attempt to correct). Unable to complete maze, again with challenges in self-correction. Plan to complete testing initial therapy session. During evaluation, pt with usual challenges in maintaining attention to therapeutic interventions, max-A for redirection to task. Expressive language notable for linguistic mazing. Pt would benefit from skilled ST to address aforementioned deficits to improve QoL and increase successful participation in home based and avocational activities .    OBJECTIVE IMPAIRMENTS: include attention, memory, executive functioning, and expressive language. These impairments are limiting patient from managing medications, managing appointments, managing finances, household responsibilities, ADLs/IADLs, and effectively communicating at home and in community. Factors  affecting potential to achieve goals and functional outcome are co-morbidities.. Patient will benefit from skilled SLP services to address above impairments and improve overall function.  REHAB POTENTIAL: Fair age, unknown etiology  PLAN:  SLP FREQUENCY: 1-2x/week  SLP DURATION: 10 weeks  PLANNED INTERVENTIONS: [phone]- [address], [place], Phon, Eval Compre, Express, [phone] Treatment of speech (30 or 45 min) , Cueing hierachy, Cognitive reorganization, Internal/external aids, Functional tasks, SLP instruction and feedback, and Compensatory strategies    Maia Breslow, CCC-SLP 12/06/2022, 11:59 AM

## 2022-12-06 NOTE — Therapy (Unsigned)
OUTPATIENT PHYSICAL THERAPY NEURO TREATMENT   Patient Name: Katrina Rivera MRN: [id number] DOB:Feb 04, 1946, 77 y.o., female Today's Date: 12/07/2022   PCP: Noberto Retort, MD REFERRING PROVIDER: Charlton Amor, PA-C  END OF SESSION:  PT End of Session - 12/06/22 1056     Visit Number 14    Number of Visits 27    Date for PT Re-Evaluation 01/13/23    Authorization Type [employer]    Authorization Time Period 09-20-22 - 12-08-22; 11-21-22 - 02-07-23    Progress Note Due on Visit 10    PT Start Time 1101    PT Stop Time 1147    PT Time Calculation (min) 46 min    Equipment Utilized During Treatment Gait belt    Activity Tolerance Patient tolerated treatment well    Behavior During Therapy WFL for tasks assessed/performed                 Past Medical History:  Diagnosis Date   Addison's disease (HCC)    takes Solu Cortef daily   Anemia    takes Ferrous Sulfate daily   Anxiety    takes Xanax nightly   Arthritis    Chronic back pain    stenosis   Depression    takes Cymbalta daily   Dizziness    if b/p drops    Fibromyalgia    History of blood transfusion    no abnormal reaction noted   History of bronchitis    many yrs ago    Hypokalemia    takes Potassium daily   Hypotension    takes Florinef daily   IBS (irritable bowel syndrome)    takes Align daily   Insomnia    takes Trazodone nightly   Joint pain    Multiple sclerosis (HCC)    doesn't take any meds   Multiple sclerosis (HCC)    New onset headache 12/22/2021   Nocturia    Osteoporosis    Palpitations    Peripheral neuropathy    Primary localized osteoarthritis of left knee 08/11/2020   Restless leg syndrome    Seasonal allergies    takes Zyrtec daily;uses Flonase daily as needed   Syncope    when missed methylprednisolone dose   Urinary frequency    takes Flomax daily   Weakness    numbness and tingling   Past Surgical History:  Procedure Laterality Date   ABDOMINAL  HYSTERECTOMY  02/07/1986   APPENDECTOMY  02/07/1986   CESAREAN SECTION  1973/1977   x2   CHOLECYSTECTOMY  02/08/1995   COLECTOMY  02/08/1988   COLONOSCOPY     ESOPHAGOGASTRODUODENOSCOPY     EYE SURGERY     bilateral - /w IOL- cataracts   FRACTURE SURGERY Right    rods and screws-right leg   LUMBAR LAMINECTOMY/DECOMPRESSION MICRODISCECTOMY Left 01/24/2013   Procedure: LUMBAR FIVE TO SACRAL ONE LUMBAR LAMINECTOMY/DECOMPRESSION MICRODISCECTOMY 1 LEVEL;  Surgeon: Tia Alert, MD;  Location: [facility];  Service: Neurosurgery;  Laterality: Left;   MAXIMUM ACCESS (MAS)POSTERIOR LUMBAR INTERBODY FUSION (PLIF) 1 LEVEL N/A 06/18/2014   Procedure: MAXIMUM ACCESS SURGERY POSTERIOR LUMBAR INTERBODY FUSION LUMBAR FIVE TO SACRAL ONE ;  Surgeon: Tia Alert, MD;  Location: [facility];  Service: Neurosurgery;  Laterality: N/A;   SPINAL CORD STIMULATOR BATTERY EXCHANGE Right 07/30/2021   Procedure: Spinal cord stimulator battery replacement;  Surgeon: Tia Alert, MD;  Location: [facility] OR;  Service: Neurosurgery;  Laterality: Right;   SPINAL CORD STIMULATOR  IMPLANT     TOTAL KNEE ARTHROPLASTY Left 08/24/2020   Procedure: TOTAL KNEE ARTHROPLASTY;  Surgeon: Salvatore Marvel, MD;  Location: [facility];  Service: Orthopedics;  Laterality: Left;   Patient Active Problem List   Diagnosis Date Noted   Myofascial pain dysfunction syndrome 11/16/2022   Vertigo 11/16/2022   Lumbar burst fracture (HCC) 08/23/2022   Lumbar vertebral fracture (HCC) 08/19/2022   Chronic migraine w/o aura w/o status migrainosus, not intractable 03/29/2022   Mixed hyperlipidemia 12/27/2021   Chest pain of uncertain etiology 12/27/2021   New onset headache 12/22/2021   S/P lumbar fusion 07/30/2021   Primary localized osteoarthritis of left knee 08/11/2020   Atrophic vaginitis 08/11/2020   Menopausal symptom 08/11/2020   Restless leg syndrome    Chronic back pain    Low back pain without sciatica 11/01/2018   Essential  hypertension    SBO (small bowel obstruction) (HCC) 09/01/2016   Fibromyalgia    Abdominal pain, vomiting, and diarrhea    Dehydration    Somnolence, daytime 10/22/2015   Fatigue 10/22/2015   Chronic leg pain 06/29/2015   S/P lumbar spinal fusion 06/18/2014   Abnormality of gait 02/18/2013   S/P lumbar microdiscectomy 01/24/2013   Hypokalemia 12/24/2012   Sweating abnormality 10/19/2012   Dysautonomia orthostatic hypotension syndrome 08/16/2012   Hereditary and idiopathic peripheral neuropathy 08/16/2012   Chronic adrenal insufficiency (HCC) 08/16/2012   MS (multiple sclerosis) (HCC) 08/15/2012    ONSET DATE: 08-17-22  REFERRING DIAG: G35 (ICD-10-CM) - Multiple sclerosis   Diagnosis  S32.039D (ICD-10-CM) - Closed fracture of third lumbar vertebra with routine healing, unspecified fracture morphology, subsequent encounter    THERAPY DIAG:  Other abnormalities of gait and mobility  Unsteadiness on feet  Muscle weakness (generalized)  Rationale for Evaluation and Treatment: Rehabilitation  SUBJECTIVE:                                                                                                                                                                                             SUBJECTIVE STATEMENT:  Pt ambulating without back brace to today's PT appt - pt reports MD discontinued it at her appt on 10-22,  but pt states she does have a CT scan scheduled today at 4:00.  Pt reports she fell last week (slid off bed)- skinned her Rt lower leg, but did not get hurt other than the abrasion:  pt reports she has MD appt on 12-15-22 at which time she will get results of CT scan- says she continues to have a lot of low back pain  Pt accompanied by: self  PERTINENT HISTORY: spinal cord stimulator implant;  MS diagnosed in 1984; s/p 2 back surgeries  Per Chart note "Brief HPI:   Katrina Rivera is a 77 y.o. female with a history of relapsing remitting multiple sclerosis who was  evaluated by Dr. Yetta Barre regarding gait abnormality, chronic low back pain and status post lumbar fusion May 2024. She has reportedly fallen 4 times at home after PLIF of L3-5. She was admitted 08/17/2022 for scheduled surgery of L3 spinal fracture. She underwent posterior fixation L2-L4 using NuVasive cortical pedicle screws, arthrodesis and removal of segmental fixation L3-L5 by Dr. Yetta Barre. PT OT evaluations carried out. Incision is healed without signs of infection. She is appropriate back soreness. She has no complaints of leg pain or new numbness, tingling or weakness per Dr. Barnett Applebaum note today. Signs are stable and she is tolerating regular diet. Other past medical history includes restless leg syndrome, anxiety, headaches with migraine features, fibromyalgia, hypotension, anemia (acute on chronic)."    PAIN:  Are you having pain? Yes: NPRS scale: 8/10 Pain location: low back pain and bilateral shoulders - recent TP injections (9am 10/9)  Pain description: tightness, throbbing, sore Aggravating factors: no specific Relieving factors: Brace helps   PRECAUTIONS: Back  - No bending, lifting >10#, twisting, reaching overhead; Dr Yetta Barre responded with "regular back precautions" via inbasket message inquiring about precautions  RED FLAGS: None   WEIGHT BEARING RESTRICTIONS: No  FALLS: Has patient fallen in last 6 months? Yes. Number of falls 4  LIVING ENVIRONMENT: Lives with: lives with their spouse Lives in: House/apartment Stairs: Yes: External: 1 steps; planning on having rail installed Has following equipment at home: Walker - 2 wheeled, shower chair, and Grab bars  PLOF: Independent with household mobility with device, Requires assistive device for independence, and Needs assistance with ADLs  PATIENT GOALS: "walk without walker and back brace and get back to where I was before"  OBJECTIVE:   DIAGNOSTIC FINDINGS: IMPRESSION: 1. Unhealed fracture of the posterosuperior L3 body, ans  associated avulsed appearance of both L3 pedicles (series 7, image 30) along the course of the bilateral L3 pedicle screws. Mild retropulsion of the posterior body fragment does not result in significant spinal stenosis. 2. No other acute osseous abnormality identified in the Lumbar Spine. But scant arthrodesis limited to only the right L5-S1 posterior elements following L3 through S1 fusion. Some endplate subsidence at each level. 3.  Aortic Atherosclerosis (ICD10-I70.0).  Nephrolithiasis.  COGNITION: Overall cognitive status: Within functional limits for tasks assessed   SENSATION: Impaired sensation in arms and legs - numbness and tingling  COORDINATION: WFL's - slowed movement due to back pain  POSTURE: rounded shoulders and forward head  LOWER EXTREMITY ROM:   WFL's bil. LE's   LOWER EXTREMITY MMT:  grossly 4/5 throughout but pain with resistance due to back pain (surgical site)  BED MOBILITY:  Independent   TRANSFERS: Assistive device utilized: Environmental consultant - 4 wheeled  Sit to stand: Modified independence Stand to sit: Modified independence  STAIRS:  TBA  GAIT: Gait pattern:  increased knee flexion bil. LE's in stance  and step through pattern Distance walked: 33' Assistive device utilized: Environmental consultant - 2 wheeled Level of assistance: SBA Comments: pt amb. With bil. Knees flexed  FUNCTIONAL TESTS:  5 times sit to stand: 21.47 secs from chair without UE support with close CGA Timed up and go (TUG): 21.47 secs with RW 10 meter walk test: 21.56 = 1.52 ft/sec with RW Berg Balance Scale: to be assessed  - pt stood for 2" without  UE support with SBA  PATIENT SURVEYS:  N/A - dx is MS  TODAY'S TREATMENT:  12-06-22    TherEx:   Sit to stand from mat with feet on floor 5 reps - without UE support  Step ups onto 6" step 10 reps RLE and LLE with bil. UE support on hand rails  Standing hip flexion, abduction and extension with 2# weight on each leg - 10 reps each direction  with each leg  NeuroRe-ed:  Rockerboard inside // bars 10 reps x 2 reps with minimal UE support on // bars with CGA  Single limb stance activities - tap ups alternating to 1 step 10 reps x 2 sets with gradual decrease in UE support - CGA to min assist needed without UE support  Standing on Airex - marching - 10 reps each leg with minimal UE support on // bars  Stepping over and back of balance beam 5 reps each leg with UE support prn  GAIT: Gait pattern: increased knee flexion bil. LE's in stance  and step through pattern Distance walked: 230' (2 laps) Assistive device utilized: None Level of assistance: CGA Comments: pt amb. With Lt knee flexed - cues to increase initial heel contact  PATIENT EDUCATION:  Education details: Continue HEP w/ addition, safe set up for banded lateral steps at home, relaxing shoulders when walking and not bearing too much weight through arms Person educated: Patient Education method: Explanation, Demonstration, Tactile cues, Verbal cues, and Handouts Education comprehension: verbalized understanding, returned demonstration, verbal cues required, tactile cues required, and needs further education  HOME EXERCISE PROGRAM: Access Code: [email or web address] URL: [email or web address] Date: 09/26/2022 Prepared by: Peter Congo  Exercises - Ankle Dorsiflexion with Resistance  - 1 x daily - 7 x weekly - 3 sets - 10 reps - Gastroc Stretch on Wall  - 1 x daily - 7 x weekly - 3 sets - 10 reps - Supine Knee Extension Stretch on Towel Roll  - 1 x daily - 7 x weekly - Supine Quad Set  - 1 x daily - 7 x weekly - 3 sets - 10 reps - Sit to Stand with Arms Crossed  - 1 x daily - 7 x weekly - 3 sets - 10 reps - Supine Bridge  - 1 x daily - 7 x weekly - 3 sets - 10 reps - Side Stepping with Resistance at Ankles and Counter Support  - 1 x daily - 7 x weekly - 3 sets - 10 reps   Access Code: [email or web address] - added 10-11-22 URL: [email or web address] Date:  10/12/2022 Prepared by: Maebelle Munroe  Exercises - Supine hamstring stretch  - 1 x daily - 7 x weekly - 1 sets - 2-3 reps - 60 sec hold  Access Code: [email or web address] URL: [email or web address] Date: 10/07/2022 Prepared by: Maebelle Munroe  Exercises - Single Leg Stance with Support  - 1 x daily - 7 x weekly - 1 sets - 2-3 reps - 10 sec hold   HEP issued: 10-04-22, updated 10-11-22 Access Code: [email or web address]:  added SLS exercise - Access Code: [email or web address] 10-06-22 URL: [email or web address] Date: 10/05/2022 Prepared by: Maebelle Munroe  Exercises - Alternating Step Forward with Support  - 1 x daily - 7 x weekly - 1 sets - 10 reps - Step Sideways  - 1 x daily - 7 x weekly - 1 sets - 10 reps - Alternating Step Backward with Support  - 1 x daily - 7 x weekly - 1 sets - 10 reps - Side Stepping  with Counter Support  - 1 x daily - 7 x weekly - 1 sets - 2-3 reps - Standing March with Counter Support  - 1 x daily - 7 x weekly - 1 sets - 10 reps   GOALS: Goals reviewed with patient? Yes  NEW SHORT TERM GOALS: Target date:  12-16-22  Improve Berg score to >/= 45/56 to reduce fall risk. Baseline: 30/56 (8/19), 34/56 (9/11);  score 41/56 on 11-21-22 Goal status: Upgraded/Revised  2.  Pt will ambulate 1' with RW with SBA on flat, even surface for increased community accessibility. Baseline: 230 ft CGA no AD (9/11) Goal status: Upgraded/Revised  3.  Report decreased back pain to </= 3/10 intensity for increased ease & comfort with ADL's & mobility.  Baseline: 5/10, 6/10 (9/11) Goal status: Ongoing  4.  Improve 5x sit to stand score to </= 16 secs from chair without UE support to demo improved LE strength.  Baseline: 21.47 secs, 15.62 sec no UE (9/11);  19.68 secs on 11-21-22 Goal status: Revised  5.  Improve TUG score to </= 17.5 secs with RW to demo improved functional mobility.  Baseline: 21.47 secs with RW, 22 sec with RW (9/11); 21.12 secs on 11-21-22 Goal status:  Ongoing  6.  Increase gait speed to >/= 1.9 ft/sec with RW with pt demonstrating bil. Knee extension in stance.  Baseline: 1.52 ft/sec with RW (21.56 secs), 1.52 ft/sec with RW (9/11); 1.67 ft/sec with RW (10-14) Goal status: Ongoing   UPDATED LONG TERM GOALS: Target date: 01-13-23  Improve Berg score  >/= 48/56 to reduce fall risk & demo improved balance. Baseline: 30/56 (8/19); score 41/56 on 11-21-22 Goal status: Goal met 11-21-22; UPGRADED/REVISED  2.  Pt will be modified independent with household ambulation without device. Baseline: pt reports she is walking short distances in home without use of RW, but still using RW for longer distances in the home Goal status: Partially met - 11-21-22; ONGOING  3.  Improve 5x sit to stand score to </= 14 secs from chair without UE support to demo improved LE strength.  Baseline: 21.47 secs;  19.68 secs Goal status: Goal not met 11-21-22  4.  Improve TUG score to </= 14.5 secs with RW to demo improved functional mobility.  Baseline: 21.47 secs with RW;  21.12 secs   Goal status: Not met 11-21-22; ONGOING  5.   Increase gait speed to >/= 2.3 ft/sec with RW with pt demonstrating bil. Knee extension in stance.  Baseline: 1.52 ft/sec with RW (21.56 secs); 19.62,  20.00 = 1.67 ft/sec with RW Goal status: Goal not met 11-21-22; ONGOING  6.  Independent in HEP for balance and LE strengthening and core stabilization exercises. Baseline:  Goal status: Goal met 11-21-22  7.  Pt will transfer floor to stand with UE support on mat table with SBA.  Baseline:  TBA  Goal status:  NEW  ASSESSMENT:  CLINICAL IMPRESSION:  PT session focused on balance training to improve SLS on each leg, gait training without device, and LE strengthening in standing with use of 2# weight.  Pt not wearing TLSO for first time today as orthosis was discontinued by MD on 11-29-22.  Pt continues to c/o low back pain with CT scan scheduled today at 4:00 per pt report.  Pt is  progressing well. Cont with POC.   OBJECTIVE IMPAIRMENTS: Abnormal gait, decreased activity tolerance, decreased balance, decreased strength, and pain.   ACTIVITY LIMITATIONS: carrying, lifting, bending, squatting, stairs, transfers, reach over  head, and locomotion level  PARTICIPATION LIMITATIONS: meal prep, cleaning, laundry, shopping, community activity, and pt reports she does not drive  PERSONAL FACTORS: Past/current experiences, Time since onset of injury/illness/exacerbation, and 1 comorbidity: MS and s/p back surgery  are also affecting patient's functional outcome.   REHAB POTENTIAL: Good  CLINICAL DECISION MAKING: Evolving/moderate complexity  EVALUATION COMPLEXITY: Moderate  PLAN:  PT FREQUENCY: 2x/week  PT DURATION: 8 weeks  PLANNED INTERVENTIONS: Therapeutic exercises, Therapeutic activity, Neuromuscular re-education, Balance training, Gait training, Patient/Family education, Self Care, Stair training, DME instructions, and Aquatic Therapy  PLAN FOR NEXT SESSION: Gait training with SPC outside, LE strengthening, endurance, balance on compliant surfaces, hip abduction and flexion strengthening    Rosalita Chessman Mackensi Mahadeo, PT  12/07/2022, 10:40 AM

## 2022-12-07 ENCOUNTER — Encounter: Payer: Self-pay | Admitting: Physical Therapy

## 2022-12-08 ENCOUNTER — Ambulatory Visit: Payer: PPO | Admitting: Physical Therapy

## 2022-12-08 ENCOUNTER — Encounter: Payer: PPO | Admitting: Occupational Therapy

## 2022-12-08 VITALS — BP 113/50

## 2022-12-08 DIAGNOSIS — M6281 Muscle weakness (generalized): Secondary | ICD-10-CM | POA: Diagnosis not present

## 2022-12-08 DIAGNOSIS — R2681 Unsteadiness on feet: Secondary | ICD-10-CM

## 2022-12-08 DIAGNOSIS — R2689 Other abnormalities of gait and mobility: Secondary | ICD-10-CM

## 2022-12-12 ENCOUNTER — Encounter: Payer: PPO | Admitting: Occupational Therapy

## 2022-12-12 ENCOUNTER — Ambulatory Visit: Payer: PPO | Admitting: Physical Therapy

## 2022-12-13 ENCOUNTER — Encounter: Payer: PPO | Admitting: Occupational Therapy

## 2022-12-13 ENCOUNTER — Ambulatory Visit: Payer: PPO | Attending: Family Medicine | Admitting: Physical Therapy

## 2022-12-13 DIAGNOSIS — R2681 Unsteadiness on feet: Secondary | ICD-10-CM | POA: Diagnosis not present

## 2022-12-13 DIAGNOSIS — M810 Age-related osteoporosis without current pathological fracture: Secondary | ICD-10-CM | POA: Diagnosis not present

## 2022-12-13 DIAGNOSIS — F419 Anxiety disorder, unspecified: Secondary | ICD-10-CM | POA: Diagnosis not present

## 2022-12-13 DIAGNOSIS — R262 Difficulty in walking, not elsewhere classified: Secondary | ICD-10-CM | POA: Insufficient documentation

## 2022-12-13 DIAGNOSIS — N183 Chronic kidney disease, stage 3 unspecified: Secondary | ICD-10-CM | POA: Diagnosis not present

## 2022-12-13 DIAGNOSIS — M6281 Muscle weakness (generalized): Secondary | ICD-10-CM

## 2022-12-13 DIAGNOSIS — G35 Multiple sclerosis: Secondary | ICD-10-CM | POA: Diagnosis not present

## 2022-12-13 DIAGNOSIS — R269 Unspecified abnormalities of gait and mobility: Secondary | ICD-10-CM | POA: Insufficient documentation

## 2022-12-13 DIAGNOSIS — R29818 Other symptoms and signs involving the nervous system: Secondary | ICD-10-CM | POA: Diagnosis not present

## 2022-12-13 DIAGNOSIS — F5101 Primary insomnia: Secondary | ICD-10-CM | POA: Diagnosis not present

## 2022-12-13 DIAGNOSIS — E2740 Unspecified adrenocortical insufficiency: Secondary | ICD-10-CM | POA: Diagnosis not present

## 2022-12-13 DIAGNOSIS — R2689 Other abnormalities of gait and mobility: Secondary | ICD-10-CM

## 2022-12-13 DIAGNOSIS — D509 Iron deficiency anemia, unspecified: Secondary | ICD-10-CM | POA: Diagnosis not present

## 2022-12-13 DIAGNOSIS — M797 Fibromyalgia: Secondary | ICD-10-CM | POA: Diagnosis not present

## 2022-12-13 DIAGNOSIS — G259 Extrapyramidal and movement disorder, unspecified: Secondary | ICD-10-CM | POA: Diagnosis not present

## 2022-12-13 DIAGNOSIS — Z23 Encounter for immunization: Secondary | ICD-10-CM | POA: Diagnosis not present

## 2022-12-13 DIAGNOSIS — I700 Atherosclerosis of aorta: Secondary | ICD-10-CM | POA: Diagnosis not present

## 2022-12-13 NOTE — Therapy (Unsigned)
OUTPATIENT PHYSICAL THERAPY NEURO TREATMENT   Patient Name: Katrina Rivera MRN: [id number] DOB:April 07, 1945, 77 y.o., female Today's Date: 12/14/2022   PCP: Noberto Retort, MD REFERRING PROVIDER: Charlton Amor, PA-C  END OF SESSION:  PT End of Session - 12/14/22 1005     Visit Number 16    Number of Visits 27    Date for PT Re-Evaluation 01/13/23    Authorization Type [employer]    Authorization Time Period 09-20-22 - 12-08-22; 11-21-22 - 02-07-23    Progress Note Due on Visit 20    PT Start Time 1018    PT Stop Time 1101    PT Time Calculation (min) 43 min    Equipment Utilized During Treatment Gait belt    Activity Tolerance Patient tolerated treatment well    Behavior During Therapy WFL for tasks assessed/performed                   Past Medical History:  Diagnosis Date   Addison's disease (HCC)    takes Solu Cortef daily   Anemia    takes Ferrous Sulfate daily   Anxiety    takes Xanax nightly   Arthritis    Chronic back pain    stenosis   Depression    takes Cymbalta daily   Dizziness    if b/p drops    Fibromyalgia    History of blood transfusion    no abnormal reaction noted   History of bronchitis    many yrs ago    Hypokalemia    takes Potassium daily   Hypotension    takes Florinef daily   IBS (irritable bowel syndrome)    takes Align daily   Insomnia    takes Trazodone nightly   Joint pain    Multiple sclerosis (HCC)    doesn't take any meds   Multiple sclerosis (HCC)    New onset headache 12/22/2021   Nocturia    Osteoporosis    Palpitations    Peripheral neuropathy    Primary localized osteoarthritis of left knee 08/11/2020   Restless leg syndrome    Seasonal allergies    takes Zyrtec daily;uses Flonase daily as needed   Syncope    when missed methylprednisolone dose   Urinary frequency    takes Flomax daily   Weakness    numbness and tingling   Past Surgical History:  Procedure Laterality Date   ABDOMINAL  HYSTERECTOMY  02/07/1986   APPENDECTOMY  02/07/1986   CESAREAN SECTION  1973/1977   x2   CHOLECYSTECTOMY  02/08/1995   COLECTOMY  02/08/1988   COLONOSCOPY     ESOPHAGOGASTRODUODENOSCOPY     EYE SURGERY     bilateral - /w IOL- cataracts   FRACTURE SURGERY Right    rods and screws-right leg   LUMBAR LAMINECTOMY/DECOMPRESSION MICRODISCECTOMY Left 01/24/2013   Procedure: LUMBAR FIVE TO SACRAL ONE LUMBAR LAMINECTOMY/DECOMPRESSION MICRODISCECTOMY 1 LEVEL;  Surgeon: Tia Alert, MD;  Location: [facility];  Service: Neurosurgery;  Laterality: Left;   MAXIMUM ACCESS (MAS)POSTERIOR LUMBAR INTERBODY FUSION (PLIF) 1 LEVEL N/A 06/18/2014   Procedure: MAXIMUM ACCESS SURGERY POSTERIOR LUMBAR INTERBODY FUSION LUMBAR FIVE TO SACRAL ONE ;  Surgeon: Tia Alert, MD;  Location: [facility];  Service: Neurosurgery;  Laterality: N/A;   SPINAL CORD STIMULATOR BATTERY EXCHANGE Right 07/30/2021   Procedure: Spinal cord stimulator battery replacement;  Surgeon: Tia Alert, MD;  Location: [facility] OR;  Service: Neurosurgery;  Laterality: Right;   SPINAL  CORD STIMULATOR IMPLANT     TOTAL KNEE ARTHROPLASTY Left 08/24/2020   Procedure: TOTAL KNEE ARTHROPLASTY;  Surgeon: Salvatore Marvel, MD;  Location: [facility];  Service: Orthopedics;  Laterality: Left;   Patient Active Problem List   Diagnosis Date Noted   Myofascial pain dysfunction syndrome 11/16/2022   Vertigo 11/16/2022   Lumbar burst fracture (HCC) 08/23/2022   Lumbar vertebral fracture (HCC) 08/19/2022   Chronic migraine w/o aura w/o status migrainosus, not intractable 03/29/2022   Mixed hyperlipidemia 12/27/2021   Chest pain of uncertain etiology 12/27/2021   New onset headache 12/22/2021   S/P lumbar fusion 07/30/2021   Primary localized osteoarthritis of left knee 08/11/2020   Atrophic vaginitis 08/11/2020   Menopausal symptom 08/11/2020   Restless leg syndrome    Chronic back pain    Low back pain without sciatica 11/01/2018   Essential  hypertension    SBO (small bowel obstruction) (HCC) 09/01/2016   Fibromyalgia    Abdominal pain, vomiting, and diarrhea    Dehydration    Somnolence, daytime 10/22/2015   Fatigue 10/22/2015   Chronic leg pain 06/29/2015   S/P lumbar spinal fusion 06/18/2014   Abnormality of gait 02/18/2013   S/P lumbar microdiscectomy 01/24/2013   Hypokalemia 12/24/2012   Sweating abnormality 10/19/2012   Dysautonomia orthostatic hypotension syndrome 08/16/2012   Hereditary and idiopathic peripheral neuropathy 08/16/2012   Chronic adrenal insufficiency (HCC) 08/16/2012   MS (multiple sclerosis) (HCC) 08/15/2012    ONSET DATE: 08-17-22  REFERRING DIAG: G35 (ICD-10-CM) - Multiple sclerosis   Diagnosis  S32.039D (ICD-10-CM) - Closed fracture of third lumbar vertebra with routine healing, unspecified fracture morphology, subsequent encounter    THERAPY DIAG:  Other abnormalities of gait and mobility  Unsteadiness on feet  Muscle weakness (generalized)  Rationale for Evaluation and Treatment: Rehabilitation  SUBJECTIVE:                                                                                                                                                                                             SUBJECTIVE STATEMENT:  Pt reports she has appt on Thursday this week with Dr. Yetta Barre to get results of CT scan done last week - pt states the results have not been put in MyChart yet; pt states she continues to have low back pain.  Pt asks about ordering cane with rubber quad base  Pt accompanied by: self  PERTINENT HISTORY: spinal cord stimulator implant; MS diagnosed in 1984; s/p 2 back surgeries  Per Chart note "Brief HPI:   Katrina Rivera is a 77 y.o. female with a history of relapsing remitting multiple sclerosis who was  evaluated by Dr. Yetta Barre regarding gait abnormality, chronic low back pain and status post lumbar fusion May 2024. She has reportedly fallen 4 times at home after PLIF of  L3-5. She was admitted 08/17/2022 for scheduled surgery of L3 spinal fracture. She underwent posterior fixation L2-L4 using NuVasive cortical pedicle screws, arthrodesis and removal of segmental fixation L3-L5 by Dr. Yetta Barre. PT OT evaluations carried out. Incision is healed without signs of infection. She is appropriate back soreness. She has no complaints of leg pain or new numbness, tingling or weakness per Dr. Barnett Applebaum note today. Signs are stable and she is tolerating regular diet. Other past medical history includes restless leg syndrome, anxiety, headaches with migraine features, fibromyalgia, hypotension, anemia (acute on chronic)."    PAIN:  Are you having pain? Yes: NPRS scale: 8/10 Pain location: low back pain and bilateral shoulders - recent TP injections (9am 10/9)  Pain description: tightness, throbbing, sore Aggravating factors: no specific Relieving factors: rest (TLSO has been discontinued by MD)   PRECAUTIONS: Back  - No bending, lifting >10#, twisting, reaching overhead; Dr Yetta Barre responded with "regular back precautions" via inbasket message inquiring about precautions  RED FLAGS: None   WEIGHT BEARING RESTRICTIONS: No  FALLS: Has patient fallen in last 6 months? Yes. Number of falls 4  LIVING ENVIRONMENT: Lives with: lives with their spouse Lives in: House/apartment Stairs: Yes: External: 1 steps; planning on having rail installed Has following equipment at home: Walker - 2 wheeled, shower chair, and Grab bars  PLOF: Independent with household mobility with device, Requires assistive device for independence, and Needs assistance with ADLs  PATIENT GOALS: "walk without walker and back brace and get back to where I was before"  OBJECTIVE:   DIAGNOSTIC FINDINGS: IMPRESSION: 1. Unhealed fracture of the posterosuperior L3 body, ans associated avulsed appearance of both L3 pedicles (series 7, image 30) along the course of the bilateral L3 pedicle screws. Mild retropulsion  of the posterior body fragment does not result in significant spinal stenosis. 2. No other acute osseous abnormality identified in the Lumbar Spine. But scant arthrodesis limited to only the right L5-S1 posterior elements following L3 through S1 fusion. Some endplate subsidence at each level. 3.  Aortic Atherosclerosis (ICD10-I70.0).  Nephrolithiasis.  COGNITION: Overall cognitive status: Within functional limits for tasks assessed   SENSATION: Impaired sensation in arms and legs - numbness and tingling  COORDINATION: WFL's - slowed movement due to back pain  POSTURE: rounded shoulders and forward head  LOWER EXTREMITY ROM:   WFL's bil. LE's   LOWER EXTREMITY MMT:  grossly 4/5 throughout but pain with resistance due to back pain (surgical site)  BED MOBILITY:  Independent   TRANSFERS: Assistive device utilized: Environmental consultant - 4 wheeled  Sit to stand: Modified independence Stand to sit: Modified independence  STAIRS:  TBA  GAIT: Gait pattern:  increased knee flexion bil. LE's in stance  and step through pattern Distance walked: 57' Assistive device utilized: Environmental consultant - 2 wheeled Level of assistance: SBA Comments: pt amb. With bil. Knees flexed  FUNCTIONAL TESTS:  5 times sit to stand: 21.47 secs from chair without UE support with close CGA Timed up and go (TUG): 21.47 secs with RW 10 meter walk test: 21.56 = 1.52 ft/sec with RW Berg Balance Scale: to be assessed  - pt stood for 2" without UE support with SBA  PATIENT SURVEYS:  N/A - dx is MS  TODAY'S TREATMENT:  12-13-22     STG assessment  TherEx:   5x  Sit to stand transfers from standard chair without UE support = 16.07 secs with mild posterior LOB on rep 3 and 4  NeuroRe-ed:  TUG score:  21.82 secs with RW, 20.56 secs with no device, 19.81secs with SPC with rubber quad base  Pt attempted to perform marching in place but c/o pain in LB with Rt hip flexion so this exercise was discontinued and modified to a  stepping strategy exercise for balance: pt performed alternating stepping forward/back, out/in, and backward/forward 5 reps with each leg each direction with CGA for safety  Self Care:  discussed STG's and progress with pt;  pt was shown SPC with offset handle and also rubber quad base for ordering (from [employer]);  pt asked if she would be able to ambulate into clinic at next PT appt (Friday, the 8th) with use of this device and not with her RW - pt was informed that she would be able to do this, as no LOB occurred during gait training with this device in today's session  GAIT: Gait pattern: increased knee flexion bil. LE's in stance  and step through pattern Distance walked:  460' - on flat, even surface in clinic Assistive device utilized: SPC with rubber quad base Level of assistance: CGA Comments: pt amb. With Lt knee flexed - cues to increase initial heel contact, increase step length of RLE  PATIENT EDUCATION:  Education details: Continue HEP w/ addition, safe set up for banded lateral steps at home, relaxing shoulders when walking and not bearing too much weight through arms Person educated: Patient Education method: Explanation, Demonstration, Tactile cues, Verbal cues, and Handouts Education comprehension: verbalized understanding, returned demonstration, verbal cues required, tactile cues required, and needs further education  HOME EXERCISE PROGRAM: Access Code: [email or web address] URL: [email or web address] Date: 09/26/2022 Prepared by: Peter Congo  Exercises - Ankle Dorsiflexion with Resistance  - 1 x daily - 7 x weekly - 3 sets - 10 reps - Gastroc Stretch on Wall  - 1 x daily - 7 x weekly - 3 sets - 10 reps - Supine Knee Extension Stretch on Towel Roll  - 1 x daily - 7 x weekly - Supine Quad Set  - 1 x daily - 7 x weekly - 3 sets - 10 reps - Sit to Stand with Arms Crossed  - 1 x daily - 7 x weekly - 3 sets - 10 reps - Supine Bridge  - 1 x daily - 7 x weekly - 3 sets -  10 reps - Side Stepping with Resistance at Ankles and Counter Support  - 1 x daily - 7 x weekly - 3 sets - 10 reps   Access Code: [email or web address] - added 10-11-22 URL: [email or web address] Date: 10/12/2022 Prepared by: Maebelle Munroe  Exercises - Supine hamstring stretch  - 1 x daily - 7 x weekly - 1 sets - 2-3 reps - 60 sec hold  Access Code: [email or web address] URL: [email or web address] Date: 10/07/2022 Prepared by: Maebelle Munroe  Exercises - Single Leg Stance with Support  - 1 x daily - 7 x weekly - 1 sets - 2-3 reps - 10 sec hold   HEP issued: 10-04-22, updated 10-11-22 Access Code: [email or web address]:  added SLS exercise - Access Code: [email or web address] 10-06-22 URL: [email or web address] Date: 10/05/2022 Prepared by: Maebelle Munroe  Exercises - Alternating Step Forward with Support  - 1 x daily - 7 x weekly - 1 sets - 10 reps - Step Sideways  - 1 x daily - 7 x weekly - 1 sets - 10  reps - Alternating Step Backward with Support  - 1 x daily - 7 x weekly - 1 sets - 10 reps - Side Stepping with Counter Support  - 1 x daily - 7 x weekly - 1 sets - 2-3 reps - Standing March with Counter Support  - 1 x daily - 7 x weekly - 1 sets - 10 reps   GOALS: Goals reviewed with patient? Yes  NEW SHORT TERM GOALS: Target date:  12-16-22  Improve Berg score to >/= 45/56 to reduce fall risk. Baseline: 30/56 (8/19), 34/56 (9/11);  score 41/56 on 11-21-22 Goal status: Upgraded/Revised  2.  Pt will ambulate 54' with RW with SBA on flat, even surface for increased community accessibility. Baseline: 230 ft CGA no AD (9/11) Goal status: Met   3.  Report decreased back pain to </= 3/10 intensity for increased ease & comfort with ADL's & mobility.  Baseline: 5/10, 6/10 (9/11);  8-9/10 intensity reported on 12-13-22 Goal status: Not met  4.  Improve 5x sit to stand score to </= 16 secs from chair without UE support to demo improved LE strength.  Baseline: 21.47 secs, 15.62 sec no UE (9/11);   19.68 secs on 11-21-22;   16.07 secs Goal status:  Partially met  12-13-22  5.  Improve TUG score to </= 17.5 secs with RW to demo improved functional mobility.  Baseline: 21.47 secs with RW, 22 sec with RW (9/11); 21.12 secs on 11-21-22; 12-13-22:   21.82 secs with RW, 20.56 secs with no device, 19.81 with SPC Goal status: Not met - Ongoing  12-13-22  6.  Increase gait speed to >/= 1.9 ft/sec with RW with pt demonstrating bil. Knee extension in stance.  Baseline: 1.52 ft/sec with RW (21.56 secs), 1.52 ft/sec with RW (9/11); 1.67 ft/sec with RW (10-14);  12-13-22:  21.73 with RW, 20.88 secs without RW = 1.57 ft/sec Goal status: Partially met 12-13-22   UPDATED LONG TERM GOALS: Target date: 01-13-23  Improve Berg score  >/= 48/56 to reduce fall risk & demo improved balance. Baseline: 30/56 (8/19); score 41/56 on 11-21-22 Goal status: Goal met 11-21-22; UPGRADED/REVISED  2.  Pt will be modified independent with household ambulation without device. Baseline: pt reports she is walking short distances in home without use of RW, but still using RW for longer distances in the home Goal status: Partially met - 11-21-22; ONGOING  3.  Improve 5x sit to stand score to </= 14 secs from chair without UE support to demo improved LE strength.  Baseline: 21.47 secs;  19.68 secs Goal status: Goal not met 11-21-22  4.  Improve TUG score to </= 14.5 secs with RW to demo improved functional mobility.  Baseline: 21.47 secs with RW;  21.12 secs:   20.41 secs,  18.81 secs without RW Goal status: Not met 11-21-22; ONGOING  5.   Increase gait speed to >/= 2.3 ft/sec with RW with pt demonstrating bil. Knee extension in stance.  Baseline: 1.52 ft/sec with RW (21.56 secs); 19.62,  20.00 = 1.67 ft/sec with RW Goal status: Goal not met 11-21-22; ONGOING  6.  Independent in HEP for balance and LE strengthening and core stabilization exercises. Baseline:  Goal status: Goal met 11-21-22  7.  Pt will transfer floor to  stand with UE support on mat table with SBA.  Baseline:  TBA  Goal status:  NEW  ASSESSMENT:  CLINICAL IMPRESSION:  PT session focused on assessment of STG's.  Pt has met STG#2 as  pt is able to amb. 460' (4 laps) with use of SPC with rubber quad base.  Goal exceeded as pt used SPC and not RW for assistance with ambulation.  STG #3 not met as pt reports increased LBP at 8-9/10 intensity in today's session (has MD appt on 12-15-22 to discuss results of CT scan).  Pt has partially met STG #4 as 5x sit to stand score = 16.07 but with mild posterior LOB.  STG #5 not met as TUG score remains approx. 20 secs with use of RW and 19 secs with use of SPC.  STG #6 is partially met as gait velocity remains 1.51 ft/sec with use of RW.  Pt is progressing slowly but steadily - LBP appears to be impacting mobility.  Cont with POC.   OBJECTIVE IMPAIRMENTS: Abnormal gait, decreased activity tolerance, decreased balance, decreased strength, and pain.   ACTIVITY LIMITATIONS: carrying, lifting, bending, squatting, stairs, transfers, reach over head, and locomotion level  PARTICIPATION LIMITATIONS: meal prep, cleaning, laundry, shopping, community activity, and pt reports she does not drive  PERSONAL FACTORS: Past/current experiences, Time since onset of injury/illness/exacerbation, and 1 comorbidity: MS and s/p back surgery  are also affecting patient's functional outcome.   REHAB POTENTIAL: Good  CLINICAL DECISION MAKING: Evolving/moderate complexity  EVALUATION COMPLEXITY: Moderate  PLAN:  PT FREQUENCY: 2x/week  PT DURATION: 8 weeks  PLANNED INTERVENTIONS: Therapeutic exercises, Therapeutic activity, Neuromuscular re-education, Balance training, Gait training, Patient/Family education, Self Care, Stair training, DME instructions, and Aquatic Therapy  PLAN FOR NEXT SESSION: Please reassess Berg balance test (last remaining STG) to be assessed; cont gait training with SPC with rubber quad base, balance  training  LE strengthening, endurance, balance on compliant surfaces, hip abduction and flexion strengthening    Rosalita Chessman Erastus Bartolomei, PT  12/14/2022, 10:29 AM

## 2022-12-14 ENCOUNTER — Encounter: Payer: Self-pay | Admitting: Physical Therapy

## 2022-12-15 ENCOUNTER — Encounter: Payer: PPO | Admitting: Occupational Therapy

## 2022-12-15 ENCOUNTER — Ambulatory Visit: Payer: PPO | Admitting: Physical Therapy

## 2022-12-16 ENCOUNTER — Ambulatory Visit: Payer: PPO | Admitting: Physical Therapy

## 2022-12-16 DIAGNOSIS — R29818 Other symptoms and signs involving the nervous system: Secondary | ICD-10-CM

## 2022-12-16 DIAGNOSIS — M6281 Muscle weakness (generalized): Secondary | ICD-10-CM

## 2022-12-16 DIAGNOSIS — R2689 Other abnormalities of gait and mobility: Secondary | ICD-10-CM

## 2022-12-16 DIAGNOSIS — R2681 Unsteadiness on feet: Secondary | ICD-10-CM

## 2022-12-16 NOTE — Therapy (Addendum)
OUTPATIENT PHYSICAL THERAPY NEURO TREATMENT   Patient Name: Katrina Rivera MRN: [id number] DOB:1945/05/27, 77 y.o., female Today's Date: 12/16/2022   PCP: Noberto Retort, MD REFERRING PROVIDER: Charlton Amor, PA-C  END OF SESSION:  PT End of Session - 12/16/22 1215     Visit Number 17    Number of Visits 27    Date for PT Re-Evaluation 01/13/23    Authorization Type [employer]    Authorization Time Period 09-20-22 - 12-08-22; 11-21-22 - 02-07-23    Progress Note Due on Visit 20    PT Start Time 1217    PT Stop Time 1317    PT Time Calculation (min) 60 min    Equipment Utilized During Treatment Gait belt    Activity Tolerance Patient tolerated treatment well    Behavior During Therapy WFL for tasks assessed/performed                    Past Medical History:  Diagnosis Date   Addison's disease (HCC)    takes Solu Cortef daily   Anemia    takes Ferrous Sulfate daily   Anxiety    takes Xanax nightly   Arthritis    Chronic back pain    stenosis   Depression    takes Cymbalta daily   Dizziness    if b/p drops    Fibromyalgia    History of blood transfusion    no abnormal reaction noted   History of bronchitis    many yrs ago    Hypokalemia    takes Potassium daily   Hypotension    takes Florinef daily   IBS (irritable bowel syndrome)    takes Align daily   Insomnia    takes Trazodone nightly   Joint pain    Multiple sclerosis (HCC)    doesn't take any meds   Multiple sclerosis (HCC)    New onset headache 12/22/2021   Nocturia    Osteoporosis    Palpitations    Peripheral neuropathy    Primary localized osteoarthritis of left knee 08/11/2020   Restless leg syndrome    Seasonal allergies    takes Zyrtec daily;uses Flonase daily as needed   Syncope    when missed methylprednisolone dose   Urinary frequency    takes Flomax daily   Weakness    numbness and tingling   Past Surgical History:  Procedure Laterality Date   ABDOMINAL  HYSTERECTOMY  02/07/1986   APPENDECTOMY  02/07/1986   CESAREAN SECTION  1973/1977   x2   CHOLECYSTECTOMY  02/08/1995   COLECTOMY  02/08/1988   COLONOSCOPY     ESOPHAGOGASTRODUODENOSCOPY     EYE SURGERY     bilateral - /w IOL- cataracts   FRACTURE SURGERY Right    rods and screws-right leg   LUMBAR LAMINECTOMY/DECOMPRESSION MICRODISCECTOMY Left 01/24/2013   Procedure: LUMBAR FIVE TO SACRAL ONE LUMBAR LAMINECTOMY/DECOMPRESSION MICRODISCECTOMY 1 LEVEL;  Surgeon: Tia Alert, MD;  Location: [facility];  Service: Neurosurgery;  Laterality: Left;   MAXIMUM ACCESS (MAS)POSTERIOR LUMBAR INTERBODY FUSION (PLIF) 1 LEVEL N/A 06/18/2014   Procedure: MAXIMUM ACCESS SURGERY POSTERIOR LUMBAR INTERBODY FUSION LUMBAR FIVE TO SACRAL ONE ;  Surgeon: Tia Alert, MD;  Location: [facility];  Service: Neurosurgery;  Laterality: N/A;   SPINAL CORD STIMULATOR BATTERY EXCHANGE Right 07/30/2021   Procedure: Spinal cord stimulator battery replacement;  Surgeon: Tia Alert, MD;  Location: [facility];  Service: Neurosurgery;  Laterality: Right;  SPINAL CORD STIMULATOR IMPLANT     TOTAL KNEE ARTHROPLASTY Left 08/24/2020   Procedure: TOTAL KNEE ARTHROPLASTY;  Surgeon: Salvatore Marvel, MD;  Location: [facility];  Service: Orthopedics;  Laterality: Left;   Patient Active Problem List   Diagnosis Date Noted   Myofascial pain dysfunction syndrome 11/16/2022   Vertigo 11/16/2022   Lumbar burst fracture (HCC) 08/23/2022   Lumbar vertebral fracture (HCC) 08/19/2022   Chronic migraine w/o aura w/o status migrainosus, not intractable 03/29/2022   Mixed hyperlipidemia 12/27/2021   Chest pain of uncertain etiology 12/27/2021   New onset headache 12/22/2021   S/P lumbar fusion 07/30/2021   Primary localized osteoarthritis of left knee 08/11/2020   Atrophic vaginitis 08/11/2020   Menopausal symptom 08/11/2020   Restless leg syndrome    Chronic back pain    Low back pain without sciatica 11/01/2018   Essential  hypertension    SBO (small bowel obstruction) (HCC) 09/01/2016   Fibromyalgia    Abdominal pain, vomiting, and diarrhea    Dehydration    Somnolence, daytime 10/22/2015   Fatigue 10/22/2015   Chronic leg pain 06/29/2015   S/P lumbar spinal fusion 06/18/2014   Abnormality of gait 02/18/2013   S/P lumbar microdiscectomy 01/24/2013   Hypokalemia 12/24/2012   Sweating abnormality 10/19/2012   Dysautonomia orthostatic hypotension syndrome 08/16/2012   Hereditary and idiopathic peripheral neuropathy 08/16/2012   Chronic adrenal insufficiency (HCC) 08/16/2012   MS (multiple sclerosis) (HCC) 08/15/2012    ONSET DATE: 08-17-22  REFERRING DIAG: G35 (ICD-10-CM) - Multiple sclerosis   Diagnosis  S32.039D (ICD-10-CM) - Closed fracture of third lumbar vertebra with routine healing, unspecified fracture morphology, subsequent encounter    THERAPY DIAG:  Other abnormalities of gait and mobility  Unsteadiness on feet  Muscle weakness (generalized)  Other symptoms and signs involving the nervous system  Rationale for Evaluation and Treatment: Rehabilitation  SUBJECTIVE:                                                                                                                                                                                             SUBJECTIVE STATEMENT:  Going to get an injection in the coccyx area on the 14th, eventually planning to remove hardware from spine to reduce pressure. No final results read for CT lumbar spine 10/29 yet. Denies falls or any acute changes.   Pt accompanied by: self  PERTINENT HISTORY: spinal cord stimulator implant; MS diagnosed in 1984; s/p 2 back surgeries  Per Chart note "Brief HPI:   Katrina Rivera is a 77 y.o. female with a history of relapsing remitting multiple sclerosis who was evaluated by Dr.  Jones regarding gait abnormality, chronic low back pain and status post lumbar fusion May 2024. She has reportedly fallen 4 times at home  after PLIF of L3-5. She was admitted 08/17/2022 for scheduled surgery of L3 spinal fracture. She underwent posterior fixation L2-L4 using NuVasive cortical pedicle screws, arthrodesis and removal of segmental fixation L3-L5 by Dr. Yetta Barre. PT OT evaluations carried out. Incision is healed without signs of infection. She is appropriate back soreness. She has no complaints of leg pain or new numbness, tingling or weakness per Dr. Barnett Applebaum note today. Signs are stable and she is tolerating regular diet. Other past medical history includes restless leg syndrome, anxiety, headaches with migraine features, fibromyalgia, hypotension, anemia (acute on chronic)."    PAIN:  Are you having pain? Yes: NPRS scale: 8/10 Pain location: low back pain into legs)  Pain description: tightness, throbbing, sore Aggravating factors: no specific Relieving factors: rest (TLSO has been discontinued by MD)   PRECAUTIONS: Back  - No bending, lifting >10#, twisting, reaching overhead; Dr Yetta Barre responded with "regular back precautions" via inbasket message inquiring about precautions  RED FLAGS: None   WEIGHT BEARING RESTRICTIONS: No  FALLS: Has patient fallen in last 6 months? Yes. Number of falls 4  LIVING ENVIRONMENT: Lives with: lives with their spouse Lives in: House/apartment Stairs: Yes: External: 1 steps; planning on having rail installed Has following equipment at home: Walker - 2 wheeled, shower chair, and Grab bars  PLOF: Independent with household mobility with device, Requires assistive device for independence, and Needs assistance with ADLs  PATIENT GOALS: "walk without walker and back brace and get back to where I was before"  OBJECTIVE:   DIAGNOSTIC FINDINGS: IMPRESSION: 1. Unhealed fracture of the posterosuperior L3 body, ans associated avulsed appearance of both L3 pedicles (series 7, image 30) along the course of the bilateral L3 pedicle screws. Mild retropulsion of the posterior body fragment  does not result in significant spinal stenosis. 2. No other acute osseous abnormality identified in the Lumbar Spine. But scant arthrodesis limited to only the right L5-S1 posterior elements following L3 through S1 fusion. Some endplate subsidence at each level. 3.  Aortic Atherosclerosis (ICD10-I70.0).  Nephrolithiasis.  COGNITION: Overall cognitive status: Within functional limits for tasks assessed   SENSATION: Impaired sensation in arms and legs - numbness and tingling  COORDINATION: WFL's - slowed movement due to back pain  POSTURE: rounded shoulders and forward head  LOWER EXTREMITY ROM:   WFL's bil. LE's   LOWER EXTREMITY MMT:  grossly 4/5 throughout but pain with resistance due to back pain (surgical site)  BED MOBILITY:  Independent   TRANSFERS: Assistive device utilized: Environmental consultant - 4 wheeled  Sit to stand: Modified independence Stand to sit: Modified independence  STAIRS:  TBA  GAIT: Gait pattern:  increased knee flexion bil. LE's in stance  and step through pattern Distance walked: 86' Assistive device utilized: Environmental consultant - 2 wheeled Level of assistance: SBA Comments: pt amb. With bil. Knees flexed  FUNCTIONAL TESTS:  5 times sit to stand: 21.47 secs from chair without UE support with close CGA Timed up and go (TUG): 21.47 secs with RW 10 meter walk test: 21.56 = 1.52 ft/sec with RW Berg Balance Scale: to be assessed  - pt stood for 2" without UE support with SBA  PATIENT SURVEYS:  N/A - dx is MS  TODAY'S TREATMENT:    TherAct Discussed using internal cues to decide to walk with walker or cane for assistance. Educated how the walker is  better for energy conservation, so it is better to select this on days where she feels ill, off balance, or may be walking longer distances.  For STG assessment  [facility] PT Assessment - 12/16/22 1226       Berg Balance Test   Sit to Stand Able to stand without using hands and stabilize independently    Standing  Unsupported Able to stand safely 2 minutes    Sitting with Back Unsupported but Feet Supported on Floor or Stool Able to sit safely and securely 2 minutes    Stand to Sit Sits safely with minimal use of hands    Transfers Able to transfer safely, minor use of hands    Standing Unsupported with Eyes Closed Able to stand 10 seconds with supervision    Standing Unsupported with Feet Together Able to place feet together independently and stand for 1 minute with supervision    From Standing, Reach Forward with Outstretched Arm Can reach confidently >25 cm (10")    From Standing Position, Pick up Object from Floor Able to pick up shoe safely and easily    From Standing Position, Turn to Look Behind Over each Shoulder Looks behind one side only/other side shows less weight shift    Turn 360 Degrees Able to turn 360 degrees safely but slowly    Standing Unsupported, Alternately Place Feet on Step/Stool Able to complete >2 steps/needs minimal assist    Standing Unsupported, One Foot in Front Able to plae foot ahead of the other independently and hold 30 seconds    Standing on One Leg Able to lift leg independently and hold equal to or more than 3 seconds    Total Score 45             Gait Training Indoors Gait pattern: decreased step length- Right, decreased step length- Left, decreased hip/knee flexion- Right, decreased hip/knee flexion- Left, and narrow BOS Distance walked: 330' on even surface Assistive device utilized:  [facility] with rubber quad base Level of assistance: CGA Comments: pt with very small step size and occasionally stepping R foot onto base of cane. Educated to keep cane out at the 1 o'clock or 2 o'clock position to avoid creating a trip hazard   Ramp negotiation x3 ascending, 180* turn, x3 descending, CGA, no LOB  PATIENT EDUCATION:  Education details: outcome measure results, POC, energy conservation Person educated: Patient Education method: Explanation, Demonstration, Tactile  cues, Verbal cues, and Handouts Education comprehension: verbalized understanding, returned demonstration, verbal cues required, tactile cues required, and needs further education  HOME EXERCISE PROGRAM: Access Code: [email or web address] (combined into one HEP 12/16/22) URL: [email or web address] Date: 12/16/2022 Prepared by: Peter Congo  Exercises - Ankle Dorsiflexion with Resistance  - 1 x daily - 7 x weekly - 3 sets - 10 reps - Gastroc Stretch on Wall  - 1 x daily - 7 x weekly - 3 sets - 10 reps - Supine Knee Extension Stretch on Towel Roll  - 1 x daily - 7 x weekly - Supine Quad Set  - 1 x daily - 7 x weekly - 3 sets - 10 reps - Sit to Stand with Arms Crossed  - 1 x daily - 7 x weekly - 3 sets - 10 reps - Supine Bridge  - 1 x daily - 7 x weekly - 3 sets - 10 reps - Side Stepping with Resistance at Ankles and Counter Support  - 1 x daily - 7 x weekly - 3 sets - 10 reps -  Supine Hamstring Stretch  - 1 x daily - 7 x weekly - 1 sets - 2-3 reps - 60 sec hold - Single Leg Stance with Support  - 1 x daily - 7 x weekly - 1 sets - 2-3 reps - 10 sec hold - Alternating Step Forward with Support  - 1 x daily - 7 x weekly - 1 sets - 10 reps - Step Sideways  - 1 x daily - 7 x weekly - 1 sets - 10 reps - Alternating Step Backward with Support  - 1 x daily - 7 x weekly - 1 sets - 10 reps - Side Stepping with Counter Support  - 1 x daily - 7 x weekly - 1 sets - 2-3 reps - Standing March with Counter Support  - 1 x daily - 7 x weekly - 1 sets - 10 reps   GOALS: Goals reviewed with patient? Yes  NEW SHORT TERM GOALS: Target date:  12-16-22  Improve Berg score to >/= 45/56 to reduce fall risk.  Baseline: 30/56 (8/19), 34/56 (9/11);  score 41/56 on 11-21-22, 45/56 (12/16/22) Goal status: MET  2.  Pt will ambulate 33' with RW with SBA on flat, even surface for increased community accessibility. Baseline: 230 ft CGA no AD (9/11) Goal status: Met   3.  Report decreased back pain to </= 3/10  intensity for increased ease & comfort with ADL's & mobility.  Baseline: 5/10, 6/10 (9/11);  8-9/10 intensity reported on 12-13-22 Goal status: Not met  4.  Improve 5x sit to stand score to </= 16 secs from chair without UE support to demo improved LE strength.  Baseline: 21.47 secs, 15.62 sec no UE (9/11);  19.68 secs on 11-21-22;   16.07 secs Goal status:  Partially met  12-13-22  5.  Improve TUG score to </= 17.5 secs with RW to demo improved functional mobility.  Baseline: 21.47 secs with RW, 22 sec with RW (9/11); 21.12 secs on 11-21-22; 12-13-22:   21.82 secs with RW, 20.56 secs with no device, 19.81 with SPC Goal status: Not met - Ongoing  12-13-22  6.  Increase gait speed to >/= 1.9 ft/sec with RW with pt demonstrating bil. Knee extension in stance.  Baseline: 1.52 ft/sec with RW (21.56 secs), 1.52 ft/sec with RW (9/11); 1.67 ft/sec with RW (10-14);  12-13-22:  21.73 with RW, 20.88 secs without RW = 1.57 ft/sec Goal status: Partially met 12-13-22   UPDATED LONG TERM GOALS: Target date: 01-13-23  Improve Berg score  >/= 48/56 to reduce fall risk & demo improved balance. Baseline: 30/56 (8/19); score 41/56 on 11-21-22, 45/56 (12/16/22) Goal status: Goal met 11-21-22; UPGRADED/REVISED  2.  Pt will be modified independent with household ambulation without device. Baseline: pt reports she is walking short distances in home without use of RW, but still using RW for longer distances in the home Goal status: Partially met - 11-21-22; ONGOING  3.  Improve 5x sit to stand score to </= 14 secs from chair without UE support to demo improved LE strength.  Baseline: 21.47 secs;  19.68 secs Goal status: Goal not met 11-21-22  4.  Improve TUG score to </= 14.5 secs with RW to demo improved functional mobility.  Baseline: 21.47 secs with RW;  21.12 secs:   20.41 secs,  18.81 secs without RW Goal status: Not met 11-21-22; ONGOING  5.   Increase gait speed to >/= 2.3 ft/sec with RW with pt  demonstrating bil. Knee extension in stance.  Baseline: 1.52 ft/sec with RW (21.56 secs); 19.62,  20.00 = 1.67 ft/sec with RW Goal status: Goal not met 11-21-22; ONGOING  6.  Independent in HEP for balance and LE strengthening and core stabilization exercises. Baseline:  Goal status: Goal met 11-21-22  7.  Pt will transfer floor to stand with UE support on mat table with SBA.  Baseline:  TBA  Goal status:  NEW  ASSESSMENT:  CLINICAL IMPRESSION:  PT session focused on assessment of STG's and practicing gait with SPC with rubber quad base. Pt has met 1/1 STGs assessed this date, improving BERG score to 45/56. Pt ambulates w/ SPC with rubber quad base slowly w/ narrow stance and occasionally stepping R foot onto the base of the cane; pt educated on safe cane use. Pt ambulated up and down a ramp x3 without LOB.  Pt is progressing slowly but steadily - LBP appears to be impacting mobility.  Continue POC.   OBJECTIVE IMPAIRMENTS: Abnormal gait, decreased activity tolerance, decreased balance, decreased strength, and pain.   ACTIVITY LIMITATIONS: carrying, lifting, bending, squatting, stairs, transfers, reach over head, and locomotion level  PARTICIPATION LIMITATIONS: meal prep, cleaning, laundry, shopping, community activity, and pt reports she does not drive  PERSONAL FACTORS: Past/current experiences, Time since onset of injury/illness/exacerbation, and 1 comorbidity: MS and s/p back surgery  are also affecting patient's functional outcome.   REHAB POTENTIAL: Good  CLINICAL DECISION MAKING: Evolving/moderate complexity  EVALUATION COMPLEXITY: Moderate  PLAN:  PT FREQUENCY: 2x/week  PT DURATION: 8 weeks  PLANNED INTERVENTIONS: Therapeutic exercises, Therapeutic activity, Neuromuscular re-education, Balance training, Gait training, Patient/Family education, Self Care, Stair training, DME instructions, and Aquatic Therapy  PLAN FOR NEXT SESSION: cont gait training with SPC with rubber  quad base, balance training, floor transfer practice, single leg stance, stance progressions, LE strengthening, endurance, balance on compliant surfaces, hip abduction and flexion strengthening    Beverely Low, SPT   12/16/2022, 3:13 PM

## 2022-12-19 ENCOUNTER — Ambulatory Visit: Payer: PPO | Admitting: Physical Therapy

## 2022-12-19 ENCOUNTER — Encounter: Payer: PPO | Admitting: Occupational Therapy

## 2022-12-19 VITALS — BP 135/71 | HR 85

## 2022-12-19 DIAGNOSIS — M6281 Muscle weakness (generalized): Secondary | ICD-10-CM

## 2022-12-19 DIAGNOSIS — R29818 Other symptoms and signs involving the nervous system: Secondary | ICD-10-CM

## 2022-12-19 DIAGNOSIS — R2689 Other abnormalities of gait and mobility: Secondary | ICD-10-CM | POA: Diagnosis not present

## 2022-12-19 DIAGNOSIS — R2681 Unsteadiness on feet: Secondary | ICD-10-CM

## 2022-12-19 NOTE — Therapy (Signed)
OUTPATIENT PHYSICAL THERAPY NEURO TREATMENT   Patient Name: Katrina Rivera MRN: [id number] DOB:09/02/1945, 77 y.o., female Today's Date: 12/19/2022   PCP: Noberto Retort, MD REFERRING PROVIDER: Charlton Amor, PA-C  END OF SESSION:  PT End of Session - 12/19/22 1059     Visit Number 18    Number of Visits 27    Date for PT Re-Evaluation 01/13/23    Authorization Type [employer]    Authorization Time Period 09-20-22 - 12-08-22; 11-21-22 - 02-07-23    Progress Note Due on Visit 20    PT Start Time 1100    PT Stop Time 1145    PT Time Calculation (min) 45 min    Equipment Utilized During Treatment Gait belt    Activity Tolerance Patient tolerated treatment well    Behavior During Therapy WFL for tasks assessed/performed                     Past Medical History:  Diagnosis Date   Addison's disease (HCC)    takes Solu Cortef daily   Anemia    takes Ferrous Sulfate daily   Anxiety    takes Xanax nightly   Arthritis    Chronic back pain    stenosis   Depression    takes Cymbalta daily   Dizziness    if b/p drops    Fibromyalgia    History of blood transfusion    no abnormal reaction noted   History of bronchitis    many yrs ago    Hypokalemia    takes Potassium daily   Hypotension    takes Florinef daily   IBS (irritable bowel syndrome)    takes Align daily   Insomnia    takes Trazodone nightly   Joint pain    Multiple sclerosis (HCC)    doesn't take any meds   Multiple sclerosis (HCC)    New onset headache 12/22/2021   Nocturia    Osteoporosis    Palpitations    Peripheral neuropathy    Primary localized osteoarthritis of left knee 08/11/2020   Restless leg syndrome    Seasonal allergies    takes Zyrtec daily;uses Flonase daily as needed   Syncope    when missed methylprednisolone dose   Urinary frequency    takes Flomax daily   Weakness    numbness and tingling   Past Surgical History:  Procedure Laterality Date    ABDOMINAL HYSTERECTOMY  02/07/1986   APPENDECTOMY  02/07/1986   CESAREAN SECTION  1973/1977   x2   CHOLECYSTECTOMY  02/08/1995   COLECTOMY  02/08/1988   COLONOSCOPY     ESOPHAGOGASTRODUODENOSCOPY     EYE SURGERY     bilateral - /w IOL- cataracts   FRACTURE SURGERY Right    rods and screws-right leg   LUMBAR LAMINECTOMY/DECOMPRESSION MICRODISCECTOMY Left 01/24/2013   Procedure: LUMBAR FIVE TO SACRAL ONE LUMBAR LAMINECTOMY/DECOMPRESSION MICRODISCECTOMY 1 LEVEL;  Surgeon: Tia Alert, MD;  Location: [facility];  Service: Neurosurgery;  Laterality: Left;   MAXIMUM ACCESS (MAS)POSTERIOR LUMBAR INTERBODY FUSION (PLIF) 1 LEVEL N/A 06/18/2014   Procedure: MAXIMUM ACCESS SURGERY POSTERIOR LUMBAR INTERBODY FUSION LUMBAR FIVE TO SACRAL ONE ;  Surgeon: Tia Alert, MD;  Location: [facility];  Service: Neurosurgery;  Laterality: N/A;   SPINAL CORD STIMULATOR BATTERY EXCHANGE Right 07/30/2021   Procedure: Spinal cord stimulator battery replacement;  Surgeon: Tia Alert, MD;  Location: [facility];  Service: Neurosurgery;  Laterality: Right;  SPINAL CORD STIMULATOR IMPLANT     TOTAL KNEE ARTHROPLASTY Left 08/24/2020   Procedure: TOTAL KNEE ARTHROPLASTY;  Surgeon: Salvatore Marvel, MD;  Location: [facility];  Service: Orthopedics;  Laterality: Left;   Patient Active Problem List   Diagnosis Date Noted   Myofascial pain dysfunction syndrome 11/16/2022   Vertigo 11/16/2022   Lumbar burst fracture (HCC) 08/23/2022   Lumbar vertebral fracture (HCC) 08/19/2022   Chronic migraine w/o aura w/o status migrainosus, not intractable 03/29/2022   Mixed hyperlipidemia 12/27/2021   Chest pain of uncertain etiology 12/27/2021   New onset headache 12/22/2021   S/P lumbar fusion 07/30/2021   Primary localized osteoarthritis of left knee 08/11/2020   Atrophic vaginitis 08/11/2020   Menopausal symptom 08/11/2020   Restless leg syndrome    Chronic back pain    Low back pain without sciatica 11/01/2018    Essential hypertension    SBO (small bowel obstruction) (HCC) 09/01/2016   Fibromyalgia    Abdominal pain, vomiting, and diarrhea    Dehydration    Somnolence, daytime 10/22/2015   Fatigue 10/22/2015   Chronic leg pain 06/29/2015   S/P lumbar spinal fusion 06/18/2014   Abnormality of gait 02/18/2013   S/P lumbar microdiscectomy 01/24/2013   Hypokalemia 12/24/2012   Sweating abnormality 10/19/2012   Dysautonomia orthostatic hypotension syndrome 08/16/2012   Hereditary and idiopathic peripheral neuropathy 08/16/2012   Chronic adrenal insufficiency (HCC) 08/16/2012   MS (multiple sclerosis) (HCC) 08/15/2012    ONSET DATE: 08-17-22  REFERRING DIAG: G35 (ICD-10-CM) - Multiple sclerosis   Diagnosis  S32.039D (ICD-10-CM) - Closed fracture of third lumbar vertebra with routine healing, unspecified fracture morphology, subsequent encounter    THERAPY DIAG:  Other abnormalities of gait and mobility  Unsteadiness on feet  Muscle weakness (generalized)  Other symptoms and signs involving the nervous system  Rationale for Evaluation and Treatment: Rehabilitation  SUBJECTIVE:                                                                                                                                                                                             SUBJECTIVE STATEMENT:  "I met the floor again", "I acted like Donn Pierini and went backwards" Pt was using no AD in living room and fell backwards this weekend. Later, "I was going to plug in something", in bedroom, with an additional fall backwards, hitting R side. Both falls required assistance from husband to return to standing. Pt feels that her balance has been worse over the past couple of days. Has not had a flare up of MS recently "It has been a long time". Reports vision changes, not being  able to read with her current reading glasses, and has an appointment with her eye doctor on Wednesday of this week. Additionally, pt  reports "I'm seeing things", this does not feel the same as prior hallucinations. On Thursday will receive coccygeal injections. Pt reports "I still have headaches all the time" for the past couple of months, accompanied by dizziness and nausea. Pt notes she may be dehydrated, when she was traveling to [facility] she would sit on the toilet 15-20 minutes before urinating.   Multiple anterior RLE bruises in various stages of healing, pt reports are from falls this weekend.   Pt accompanied by: self  PERTINENT HISTORY: spinal cord stimulator implant; MS diagnosed in 1984; s/p 2 back surgeries  Per Chart note "Brief HPI:   KEYLAN MECHAM is a 77 y.o. female with a history of relapsing remitting multiple sclerosis who was evaluated by Dr. Yetta Barre regarding gait abnormality, chronic low back pain and status post lumbar fusion May 2024. She has reportedly fallen 4 times at home after PLIF of L3-5. She was admitted 08/17/2022 for scheduled surgery of L3 spinal fracture. She underwent posterior fixation L2-L4 using NuVasive cortical pedicle screws, arthrodesis and removal of segmental fixation L3-L5 by Dr. Yetta Barre. PT OT evaluations carried out. Incision is healed without signs of infection. She is appropriate back soreness. She has no complaints of leg pain or new numbness, tingling or weakness per Dr. Barnett Applebaum note today. Signs are stable and she is tolerating regular diet. Other past medical history includes restless leg syndrome, anxiety, headaches with migraine features, fibromyalgia, hypotension, anemia (acute on chronic)."    PAIN:  Are you having pain? Yes: NPRS scale: 8/10 Pain location: low back pain into legs)  Pain description: tightness, throbbing, sore Aggravating factors: no specific Relieving factors: rest (TLSO has been discontinued by MD)   PRECAUTIONS: Back  - No bending, lifting >10#, twisting, reaching overhead; Dr Yetta Barre responded with "regular back precautions" via inbasket message inquiring  about precautions  RED FLAGS: None   WEIGHT BEARING RESTRICTIONS: No  FALLS: Has patient fallen in last 6 months? Yes. Number of falls 4  LIVING ENVIRONMENT: Lives with: lives with their spouse Lives in: House/apartment Stairs: Yes: External: 1 steps; planning on having rail installed Has following equipment at home: Walker - 2 wheeled, shower chair, and Grab bars  PLOF: Independent with household mobility with device, Requires assistive device for independence, and Needs assistance with ADLs  PATIENT GOALS: "walk without walker and back brace and get back to where I was before"  OBJECTIVE:   DIAGNOSTIC FINDINGS: IMPRESSION: 1. Unhealed fracture of the posterosuperior L3 body, ans associated avulsed appearance of both L3 pedicles (series 7, image 30) along the course of the bilateral L3 pedicle screws. Mild retropulsion of the posterior body fragment does not result in significant spinal stenosis. 2. No other acute osseous abnormality identified in the Lumbar Spine. But scant arthrodesis limited to only the right L5-S1 posterior elements following L3 through S1 fusion. Some endplate subsidence at each level. 3.  Aortic Atherosclerosis (ICD10-I70.0).  Nephrolithiasis.  COGNITION: Overall cognitive status: Within functional limits for tasks assessed   SENSATION: Impaired sensation in arms and legs - numbness and tingling  COORDINATION: WFL's - slowed movement due to back pain  POSTURE: rounded shoulders and forward head  LOWER EXTREMITY ROM:   WFL's bil. LE's   LOWER EXTREMITY MMT:  grossly 4/5 throughout but pain with resistance due to back pain (surgical site)  BED MOBILITY:  Independent  TRANSFERS: Assistive device utilized: Environmental consultant - 4 wheeled  Sit to stand: Modified independence Stand to sit: Modified independence  STAIRS:  TBA  GAIT: Gait pattern:  increased knee flexion bil. LE's in stance  and step through pattern Distance walked: 93' Assistive  device utilized: Environmental consultant - 2 wheeled Level of assistance: SBA Comments: pt amb. With bil. Knees flexed  FUNCTIONAL TESTS:  5 times sit to stand: 21.47 secs from chair without UE support with close CGA Timed up and go (TUG): 21.47 secs with RW 10 meter walk test: 21.56 = 1.52 ft/sec with RW Berg Balance Scale: to be assessed  - pt stood for 2" without UE support with SBA  PATIENT SURVEYS:  N/A - dx is MS  TODAY'S TREATMENT:    TherAct Vitals:   12/19/22 1113  BP: 135/71  Pulse: 85   Provided pt education on dehydration, including demonstrating skin rebound when hydrated vs not. Discussed recommended daily water intake as well as urine color to determine if appropriately hydrated. Educated on potential contribution to falls. Encouraged pt to increase water consumption as able.  Educated pt to use walker in home to prevent falls.  Indoors Ambulation Gait pattern: step through pattern, decreased step length- Right, decreased step length- Left, knee flexed in stance- Right, knee flexed in stance- Left, poor foot clearance- Right, and poor foot clearance- Left; L knee flexion in stance > R Distance walked: 376ft Assistive device utilized: Environmental consultant - 2 wheeled Level of assistance: CGA Comments: Pt reports dizziness with both horizontal and vertical head turns, slowing down substantially more with vertical   Standing Balance Assessment mCTSIB: L knee in some flexion throughout  Condition 1: 30 sec; medial/lateral sway > anterior/posterior Condition 2: 30 sec Condition 3: 30 sec; feet not completely romberg Condition 4: - sec; feet not completely romberg, pt requires Min A throughout trial to maintain balance; pt notes feeling like she was moving in a circle       =90/120   PATIENT EDUCATION:  Education details: balance systems, hydration, use AD in home Person educated: Patient Education method: Explanation and Demonstration Education comprehension: verbalized understanding, returned  demonstration, and needs further education  HOME EXERCISE PROGRAM:  Access Code: [email or web address] (combined into one HEP 12/16/22) URL: [email or web address] Date: 12/16/2022 Prepared by: Peter Congo  Exercises - Ankle Dorsiflexion with Resistance  - 1 x daily - 7 x weekly - 3 sets - 10 reps - Gastroc Stretch on Wall  - 1 x daily - 7 x weekly - 3 sets - 10 reps - Supine Knee Extension Stretch on Towel Roll  - 1 x daily - 7 x weekly - Supine Quad Set  - 1 x daily - 7 x weekly - 3 sets - 10 reps - Sit to Stand with Arms Crossed  - 1 x daily - 7 x weekly - 3 sets - 10 reps - Supine Bridge  - 1 x daily - 7 x weekly - 3 sets - 10 reps - Side Stepping with Resistance at Ankles and Counter Support  - 1 x daily - 7 x weekly - 3 sets - 10 reps - Supine Hamstring Stretch  - 1 x daily - 7 x weekly - 1 sets - 2-3 reps - 60 sec hold - Single Leg Stance with Support  - 1 x daily - 7 x weekly - 1 sets - 2-3 reps - 10 sec hold - Alternating Step Forward with Support  - 1 x daily - 7 x weekly -  1 sets - 10 reps - Step Sideways  - 1 x daily - 7 x weekly - 1 sets - 10 reps - Alternating Step Backward with Support  - 1 x daily - 7 x weekly - 1 sets - 10 reps - Side Stepping with Counter Support  - 1 x daily - 7 x weekly - 1 sets - 2-3 reps - Standing March with Counter Support  - 1 x daily - 7 x weekly - 1 sets - 10 reps   GOALS: Goals reviewed with patient? Yes  NEW SHORT TERM GOALS: Target date:  12-16-22  Improve Berg score to >/= 45/56 to reduce fall risk.  Baseline: 30/56 (8/19), 34/56 (9/11);  score 41/56 on 11-21-22, 45/56 (12/16/22) Goal status: MET  2.  Pt will ambulate 39' with RW with SBA on flat, even surface for increased community accessibility. Baseline: 230 ft CGA no AD (9/11) Goal status: Met   3.  Report decreased back pain to </= 3/10 intensity for increased ease & comfort with ADL's & mobility.  Baseline: 5/10, 6/10 (9/11);  8-9/10 intensity reported on 12-13-22 Goal  status: Not met  4.  Improve 5x sit to stand score to </= 16 secs from chair without UE support to demo improved LE strength.  Baseline: 21.47 secs, 15.62 sec no UE (9/11);  19.68 secs on 11-21-22;   16.07 secs Goal status:  Partially met  12-13-22  5.  Improve TUG score to </= 17.5 secs with RW to demo improved functional mobility.  Baseline: 21.47 secs with RW, 22 sec with RW (9/11); 21.12 secs on 11-21-22; 12-13-22:   21.82 secs with RW, 20.56 secs with no device, 19.81 with SPC Goal status: Not met - Ongoing  12-13-22  6.  Increase gait speed to >/= 1.9 ft/sec with RW with pt demonstrating bil. Knee extension in stance.  Baseline: 1.52 ft/sec with RW (21.56 secs), 1.52 ft/sec with RW (9/11); 1.67 ft/sec with RW (10-14);  12-13-22:  21.73 with RW, 20.88 secs without RW = 1.57 ft/sec Goal status: Partially met 12-13-22   UPDATED LONG TERM GOALS: Target date: 01-13-23  Improve Berg score  >/= 48/56 to reduce fall risk & demo improved balance. Baseline: 30/56 (8/19); score 41/56 on 11-21-22, 45/56 (12/16/22) Goal status: Goal met 11-21-22; UPGRADED/REVISED  2.  Pt will be modified independent with household ambulation without device. Baseline: pt reports she is walking short distances in home without use of RW, but still using RW for longer distances in the home Goal status: Partially met - 11-21-22; ONGOING  3.  Improve 5x sit to stand score to </= 14 secs from chair without UE support to demo improved LE strength.  Baseline: 21.47 secs;  19.68 secs Goal status: Goal not met 11-21-22  4.  Improve TUG score to </= 14.5 secs with RW to demo improved functional mobility.  Baseline: 21.47 secs with RW;  21.12 secs:   20.41 secs,  18.81 secs without RW Goal status: Not met 11-21-22; ONGOING  5.   Increase gait speed to >/= 2.3 ft/sec with RW with pt demonstrating bil. Knee extension in stance.  Baseline: 1.52 ft/sec with RW (21.56 secs); 19.62,  20.00 = 1.67 ft/sec with RW Goal status: Goal  not met 11-21-22; ONGOING  6.  Independent in HEP for balance and LE strengthening and core stabilization exercises. Baseline:  Goal status: Goal met 11-21-22  7.  Pt will transfer floor to stand with UE support on mat table with SBA.  Baseline:  TBA  Goal status:  NEW  ASSESSMENT:  CLINICAL IMPRESSION:  PT session focused on lengthy discussion of recent falls and symptom changes, assessing gait with RW with head turns, and assessing standing balance. Pt reports recent changes in balance, vision, dizziness, nausea, increased headaches, and dehydration. Educated pt to use RW in home and appropriate daily water intake. Exasperated gait impairments this date compared to last session, notably increased knee flexion in stance L>R, decreased gait speed and decreased step length. Pt with dizziness with both vertical and horizontal head turns when ambulating. For standing balance assessed MCTSIB; pt stands w/ L knee flexed throughout, pt unable to perform condition 4 w/o Min A to maintain balance. Continue POC.   OBJECTIVE IMPAIRMENTS: Abnormal gait, decreased activity tolerance, decreased balance, decreased strength, and pain.   ACTIVITY LIMITATIONS: carrying, lifting, bending, squatting, stairs, transfers, reach over head, and locomotion level  PARTICIPATION LIMITATIONS: meal prep, cleaning, laundry, shopping, community activity, and pt reports she does not drive  PERSONAL FACTORS: Past/current experiences, Time since onset of injury/illness/exacerbation, and 1 comorbidity: MS and s/p back surgery  are also affecting patient's functional outcome.   REHAB POTENTIAL: Good  CLINICAL DECISION MAKING: Evolving/moderate complexity  EVALUATION COMPLEXITY: Moderate  PLAN:  PT FREQUENCY: 2x/week  PT DURATION: 8 weeks  PLANNED INTERVENTIONS: Therapeutic exercises, Therapeutic activity, Neuromuscular re-education, Balance training, Gait training, Patient/Family education, Self Care, Stair training,  DME instructions, and Aquatic Therapy  PLAN FOR NEXT SESSION: cont gait training with SPC with rubber quad base, balance training, floor transfer practice, single leg stance, stance progressions, LE strengthening, endurance, balance on compliant surfaces, hip abduction and flexion strengthening   Pain/changes after coccygeal injections? How did eye doctor visit go? Did we get the appointment with neurology moved up? Falls?  Beverely Low, SPT   12/19/2022, 12:10 PM

## 2022-12-21 DIAGNOSIS — H04123 Dry eye syndrome of bilateral lacrimal glands: Secondary | ICD-10-CM | POA: Diagnosis not present

## 2022-12-21 DIAGNOSIS — H43391 Other vitreous opacities, right eye: Secondary | ICD-10-CM | POA: Diagnosis not present

## 2022-12-21 DIAGNOSIS — Z961 Presence of intraocular lens: Secondary | ICD-10-CM | POA: Diagnosis not present

## 2022-12-21 DIAGNOSIS — H35371 Puckering of macula, right eye: Secondary | ICD-10-CM | POA: Diagnosis not present

## 2022-12-21 DIAGNOSIS — H40013 Open angle with borderline findings, low risk, bilateral: Secondary | ICD-10-CM | POA: Diagnosis not present

## 2022-12-22 ENCOUNTER — Ambulatory Visit: Payer: PPO | Admitting: Physical Therapy

## 2022-12-22 ENCOUNTER — Encounter: Payer: PPO | Admitting: Speech Pathology

## 2022-12-22 ENCOUNTER — Encounter: Payer: PPO | Admitting: Occupational Therapy

## 2022-12-22 DIAGNOSIS — M461 Sacroiliitis, not elsewhere classified: Secondary | ICD-10-CM | POA: Diagnosis not present

## 2022-12-26 ENCOUNTER — Ambulatory Visit: Payer: PPO | Admitting: Physical Therapy

## 2022-12-26 DIAGNOSIS — R2689 Other abnormalities of gait and mobility: Secondary | ICD-10-CM | POA: Diagnosis not present

## 2022-12-26 DIAGNOSIS — R2681 Unsteadiness on feet: Secondary | ICD-10-CM

## 2022-12-26 NOTE — Therapy (Unsigned)
OUTPATIENT PHYSICAL THERAPY NEURO TREATMENT   Patient Name: Katrina Rivera MRN: [id number] DOB:Aug 23, 1945, 76 y.o., female Today's Date: 12/27/2022   PCP: Noberto Retort, MD REFERRING PROVIDER: Charlton Amor, PA-C  END OF SESSION:  PT End of Session - 12/27/22 0942     Visit Number 19    Number of Visits 27    Date for PT Re-Evaluation 01/13/23    Authorization Type [employer]    Authorization Time Period 09-20-22 - 12-08-22; 11-21-22 - 02-07-23    Progress Note Due on Visit 20    PT Start Time 0932    PT Stop Time 1016    PT Time Calculation (min) 44 min    Equipment Utilized During Treatment Gait belt    Activity Tolerance Patient tolerated treatment well    Behavior During Therapy WFL for tasks assessed/performed                     Past Medical History:  Diagnosis Date   Addison's disease (HCC)    takes Solu Cortef daily   Anemia    takes Ferrous Sulfate daily   Anxiety    takes Xanax nightly   Arthritis    Chronic back pain    stenosis   Depression    takes Cymbalta daily   Dizziness    if b/p drops    Fibromyalgia    History of blood transfusion    no abnormal reaction noted   History of bronchitis    many yrs ago    Hypokalemia    takes Potassium daily   Hypotension    takes Florinef daily   IBS (irritable bowel syndrome)    takes Align daily   Insomnia    takes Trazodone nightly   Joint pain    Multiple sclerosis (HCC)    doesn't take any meds   Multiple sclerosis (HCC)    New onset headache 12/22/2021   Nocturia    Osteoporosis    Palpitations    Peripheral neuropathy    Primary localized osteoarthritis of left knee 08/11/2020   Restless leg syndrome    Seasonal allergies    takes Zyrtec daily;uses Flonase daily as needed   Syncope    when missed methylprednisolone dose   Urinary frequency    takes Flomax daily   Weakness    numbness and tingling   Past Surgical History:  Procedure Laterality Date    ABDOMINAL HYSTERECTOMY  02/07/1986   APPENDECTOMY  02/07/1986   CESAREAN SECTION  1973/1977   x2   CHOLECYSTECTOMY  02/08/1995   COLECTOMY  02/08/1988   COLONOSCOPY     ESOPHAGOGASTRODUODENOSCOPY     EYE SURGERY     bilateral - /w IOL- cataracts   FRACTURE SURGERY Right    rods and screws-right leg   LUMBAR LAMINECTOMY/DECOMPRESSION MICRODISCECTOMY Left 01/24/2013   Procedure: LUMBAR FIVE TO SACRAL ONE LUMBAR LAMINECTOMY/DECOMPRESSION MICRODISCECTOMY 1 LEVEL;  Surgeon: Tia Alert, MD;  Location: [facility];  Service: Neurosurgery;  Laterality: Left;   MAXIMUM ACCESS (MAS)POSTERIOR LUMBAR INTERBODY FUSION (PLIF) 1 LEVEL N/A 06/18/2014   Procedure: MAXIMUM ACCESS SURGERY POSTERIOR LUMBAR INTERBODY FUSION LUMBAR FIVE TO SACRAL ONE ;  Surgeon: Tia Alert, MD;  Location: [facility];  Service: Neurosurgery;  Laterality: N/A;   SPINAL CORD STIMULATOR BATTERY EXCHANGE Right 07/30/2021   Procedure: Spinal cord stimulator battery replacement;  Surgeon: Tia Alert, MD;  Location: [facility];  Service: Neurosurgery;  Laterality: Right;  SPINAL CORD STIMULATOR IMPLANT     TOTAL KNEE ARTHROPLASTY Left 08/24/2020   Procedure: TOTAL KNEE ARTHROPLASTY;  Surgeon: Salvatore Marvel, MD;  Location: [facility];  Service: Orthopedics;  Laterality: Left;   Patient Active Problem List   Diagnosis Date Noted   Myofascial pain dysfunction syndrome 11/16/2022   Vertigo 11/16/2022   Lumbar burst fracture (HCC) 08/23/2022   Lumbar vertebral fracture (HCC) 08/19/2022   Chronic migraine w/o aura w/o status migrainosus, not intractable 03/29/2022   Mixed hyperlipidemia 12/27/2021   Chest pain of uncertain etiology 12/27/2021   New onset headache 12/22/2021   S/P lumbar fusion 07/30/2021   Primary localized osteoarthritis of left knee 08/11/2020   Atrophic vaginitis 08/11/2020   Menopausal symptom 08/11/2020   Restless leg syndrome    Chronic back pain    Low back pain without sciatica 11/01/2018    Essential hypertension    SBO (small bowel obstruction) (HCC) 09/01/2016   Fibromyalgia    Abdominal pain, vomiting, and diarrhea    Dehydration    Somnolence, daytime 10/22/2015   Fatigue 10/22/2015   Chronic leg pain 06/29/2015   S/P lumbar spinal fusion 06/18/2014   Abnormality of gait 02/18/2013   S/P lumbar microdiscectomy 01/24/2013   Hypokalemia 12/24/2012   Sweating abnormality 10/19/2012   Dysautonomia orthostatic hypotension syndrome 08/16/2012   Hereditary and idiopathic peripheral neuropathy 08/16/2012   Chronic adrenal insufficiency (HCC) 08/16/2012   MS (multiple sclerosis) (HCC) 08/15/2012    ONSET DATE: 08-17-22  REFERRING DIAG: G35 (ICD-10-CM) - Multiple sclerosis   Diagnosis  S32.039D (ICD-10-CM) - Closed fracture of third lumbar vertebra with routine healing, unspecified fracture morphology, subsequent encounter    THERAPY DIAG:  Other abnormalities of gait and mobility  Unsteadiness on feet  Rationale for Evaluation and Treatment: Rehabilitation  SUBJECTIVE:                                                                                                                                                                                             SUBJECTIVE STATEMENT:  Pt reports she received epidural injection last Thursday as scheduled -reports less back pain today but states it is still there.  Pt reports she was in [place] on Saturday and she almost fell - states her husband saw her just standing in area as they were leaving and looking pale - pt says she doesn't remember a lot about the situation - husband assisted her to a bench to sit down.  Pt reports she got out of car as they went to [employer] afterwards and she almost fell again so husband assisted her back into car and they  just went home.  Pt reports she doesn't know why her balance is so much worse now. (Has been since fall 10 days ago)  Pt accompanied by: self  PERTINENT HISTORY: spinal cord  stimulator implant; MS diagnosed in 1984; s/p 2 back surgeries  Per Chart note "Brief HPI:   Katrina Rivera is a 77 y.o. female with a history of relapsing remitting multiple sclerosis who was evaluated by Dr. Yetta Barre regarding gait abnormality, chronic low back pain and status post lumbar fusion May 2024. She has reportedly fallen 4 times at home after PLIF of L3-5. She was admitted 08/17/2022 for scheduled surgery of L3 spinal fracture. She underwent posterior fixation L2-L4 using NuVasive cortical pedicle screws, arthrodesis and removal of segmental fixation L3-L5 by Dr. Yetta Barre. PT OT evaluations carried out. Incision is healed without signs of infection. She is appropriate back soreness. She has no complaints of leg pain or new numbness, tingling or weakness per Dr. Barnett Applebaum note today. Signs are stable and she is tolerating regular diet. Other past medical history includes restless leg syndrome, anxiety, headaches with migraine features, fibromyalgia, hypotension, anemia (acute on chronic)."    PAIN:  Are you having pain? Yes: NPRS scale: 8/10 Pain location: low back pain into legs)  Pain description: tightness, throbbing, sore Aggravating factors: no specific Relieving factors: rest (TLSO has been discontinued by MD)   PRECAUTIONS: Back  - No bending, lifting >10#, twisting, reaching overhead; Dr Yetta Barre responded with "regular back precautions" via inbasket message inquiring about precautions  RED FLAGS: None   WEIGHT BEARING RESTRICTIONS: No  FALLS: Has patient fallen in last 6 months? Yes. Number of falls 4  LIVING ENVIRONMENT: Lives with: lives with their spouse Lives in: House/apartment Stairs: Yes: External: 1 steps; planning on having rail installed Has following equipment at home: Walker - 2 wheeled, shower chair, and Grab bars  PLOF: Independent with household mobility with device, Requires assistive device for independence, and Needs assistance with ADLs  PATIENT GOALS: "walk  without walker and back brace and get back to where I was before"  OBJECTIVE:   DIAGNOSTIC FINDINGS: IMPRESSION: 1. Unhealed fracture of the posterosuperior L3 body, ans associated avulsed appearance of both L3 pedicles (series 7, image 30) along the course of the bilateral L3 pedicle screws. Mild retropulsion of the posterior body fragment does not result in significant spinal stenosis. 2. No other acute osseous abnormality identified in the Lumbar Spine. But scant arthrodesis limited to only the right L5-S1 posterior elements following L3 through S1 fusion. Some endplate subsidence at each level. 3.  Aortic Atherosclerosis (ICD10-I70.0).  Nephrolithiasis.  COGNITION: Overall cognitive status: Within functional limits for tasks assessed   SENSATION: Impaired sensation in arms and legs - numbness and tingling  COORDINATION: WFL's - slowed movement due to back pain  POSTURE: rounded shoulders and forward head  LOWER EXTREMITY ROM:   WFL's bil. LE's   LOWER EXTREMITY MMT:  grossly 4/5 throughout but pain with resistance due to back pain (surgical site)  BED MOBILITY:  Independent   TRANSFERS: Assistive device utilized: Environmental consultant - 4 wheeled  Sit to stand: Modified independence Stand to sit: Modified independence  STAIRS:  TBA  GAIT: Gait pattern:  increased knee flexion bil. LE's in stance  and step through pattern Distance walked: 40' Assistive device utilized: Environmental consultant - 2 wheeled Level of assistance: SBA Comments: pt amb. With bil. Knees flexed  FUNCTIONAL TESTS:  5 times sit to stand: 21.47 secs from chair without UE support with  close CGA Timed up and go (TUG): 21.47 secs with RW 10 meter walk test: 21.56 = 1.52 ft/sec with RW Berg Balance Scale: to be assessed  - pt stood for 2" without UE support with SBA  PATIENT SURVEYS:  N/A - dx is MS  TODAY'S TREATMENT:    Gait: Gait pattern: decreased step length- Right, decreased step length- Left, decreased  hip/knee flexion- Right, decreased hip/knee flexion- Left, and narrow BOS Distance walked: 350' on even surface Assistive device utilized:  SPC with rubber quad base Level of assistance: CGA Comments: cues to increase step length   Neuro Re-ed:  Activities to increase SLS on each leg; touching cones (2) with each foot with min assist for balance recovery Alternate tap ups to first step 10 reps each leg with UE support prn Step ups onto 1st step with bil. UE support 5 reps each leg with CGA   Standing - hip flexion alternating 10 reps, alternating hip abduction 10 reps; alternating hip extension 10 reps with UE support  PATIENT EDUCATION:  Education details: recommended to pt that she discuss status change with MD -  pt asks if the injection may be contributing to her increased balance deficits Person educated: Patient Education method: Explanation, Demonstration, Tactile cues, Verbal cues, and Handouts Education comprehension: verbalized understanding, returned demonstration, verbal cues required, tactile cues required, and needs further education  HOME EXERCISE PROGRAM: Access Code: [email or web address] (combined into one HEP 12/16/22) URL: [email or web address] Date: 12/16/2022 Prepared by: Peter Congo  Exercises - Ankle Dorsiflexion with Resistance  - 1 x daily - 7 x weekly - 3 sets - 10 reps - Gastroc Stretch on Wall  - 1 x daily - 7 x weekly - 3 sets - 10 reps - Supine Knee Extension Stretch on Towel Roll  - 1 x daily - 7 x weekly - Supine Quad Set  - 1 x daily - 7 x weekly - 3 sets - 10 reps - Sit to Stand with Arms Crossed  - 1 x daily - 7 x weekly - 3 sets - 10 reps - Supine Bridge  - 1 x daily - 7 x weekly - 3 sets - 10 reps - Side Stepping with Resistance at Ankles and Counter Support  - 1 x daily - 7 x weekly - 3 sets - 10 reps - Supine Hamstring Stretch  - 1 x daily - 7 x weekly - 1 sets - 2-3 reps - 60 sec hold - Single Leg Stance with Support  - 1 x daily - 7 x  weekly - 1 sets - 2-3 reps - 10 sec hold - Alternating Step Forward with Support  - 1 x daily - 7 x weekly - 1 sets - 10 reps - Step Sideways  - 1 x daily - 7 x weekly - 1 sets - 10 reps - Alternating Step Backward with Support  - 1 x daily - 7 x weekly - 1 sets - 10 reps - Side Stepping with Counter Support  - 1 x daily - 7 x weekly - 1 sets - 2-3 reps - Standing March with Counter Support  - 1 x daily - 7 x weekly - 1 sets - 10 reps   GOALS: Goals reviewed with patient? Yes  NEW SHORT TERM GOALS: Target date:  12-16-22  Improve Berg score to >/= 45/56 to reduce fall risk.  Baseline: 30/56 (8/19), 34/56 (9/11);  score 41/56 on 11-21-22, 45/56 (12/16/22) Goal status: MET  2.  Pt will ambulate  4' with RW with SBA on flat, even surface for increased community accessibility. Baseline: 230 ft CGA no AD (9/11) Goal status: Met   3.  Report decreased back pain to </= 3/10 intensity for increased ease & comfort with ADL's & mobility.  Baseline: 5/10, 6/10 (9/11);  8-9/10 intensity reported on 12-13-22 Goal status: Not met  4.  Improve 5x sit to stand score to </= 16 secs from chair without UE support to demo improved LE strength.  Baseline: 21.47 secs, 15.62 sec no UE (9/11);  19.68 secs on 11-21-22;   16.07 secs Goal status:  Partially met  12-13-22  5.  Improve TUG score to </= 17.5 secs with RW to demo improved functional mobility.  Baseline: 21.47 secs with RW, 22 sec with RW (9/11); 21.12 secs on 11-21-22; 12-13-22:   21.82 secs with RW, 20.56 secs with no device, 19.81 with SPC Goal status: Not met - Ongoing  12-13-22  6.  Increase gait speed to >/= 1.9 ft/sec with RW with pt demonstrating bil. Knee extension in stance.  Baseline: 1.52 ft/sec with RW (21.56 secs), 1.52 ft/sec with RW (9/11); 1.67 ft/sec with RW (10-14);  12-13-22:  21.73 with RW, 20.88 secs without RW = 1.57 ft/sec Goal status: Partially met 12-13-22   UPDATED LONG TERM GOALS: Target date: 01-13-23  Improve Berg score   >/= 48/56 to reduce fall risk & demo improved balance. Baseline: 30/56 (8/19); score 41/56 on 11-21-22, 45/56 (12/16/22) Goal status: Goal met 11-21-22; UPGRADED/REVISED  2.  Pt will be modified independent with household ambulation without device. Baseline: pt reports she is walking short distances in home without use of RW, but still using RW for longer distances in the home Goal status: Partially met - 11-21-22; ONGOING  3.  Improve 5x sit to stand score to </= 14 secs from chair without UE support to demo improved LE strength.  Baseline: 21.47 secs;  19.68 secs Goal status: Goal not met 11-21-22  4.  Improve TUG score to </= 14.5 secs with RW to demo improved functional mobility.  Baseline: 21.47 secs with RW;  21.12 secs:   20.41 secs,  18.81 secs without RW Goal status: Not met 11-21-22; ONGOING  5.   Increase gait speed to >/= 2.3 ft/sec with RW with pt demonstrating bil. Knee extension in stance.  Baseline: 1.52 ft/sec with RW (21.56 secs); 19.62,  20.00 = 1.67 ft/sec with RW Goal status: Goal not met 11-21-22; ONGOING  6.  Independent in HEP for balance and LE strengthening and core stabilization exercises. Baseline:  Goal status: Goal met 11-21-22  7.  Pt will transfer floor to stand with UE support on mat table with SBA.  Baseline:  TBA  Goal status:  NEW  ASSESSMENT:  CLINICAL IMPRESSION:  PT session focused on balance training and on gait training with SPC with rubber quad base.  Pt demonstrating decreased balance with SLS activities (on each leg) in today's session.  Pt describes several episodes on Saturday in which she "blanked out" and nearly fell, requiring husband's assistance to prevent fall by assisting her to nearby bench in [place].  Pt was instructed to use RW for assistance with household ambulation to decrease fall risk, and not to use SPC at this time due to episodes of LOB occurring on Saturday per her report.  Continue POC.   OBJECTIVE IMPAIRMENTS:  Abnormal gait, decreased activity tolerance, decreased balance, decreased strength, and pain.   ACTIVITY LIMITATIONS: carrying, lifting, bending, squatting, stairs, transfers,  reach over head, and locomotion level  PARTICIPATION LIMITATIONS: meal prep, cleaning, laundry, shopping, community activity, and pt reports she does not drive  PERSONAL FACTORS: Past/current experiences, Time since onset of injury/illness/exacerbation, and 1 comorbidity: MS and s/p back surgery  are also affecting patient's functional outcome.   REHAB POTENTIAL: Good  CLINICAL DECISION MAKING: Evolving/moderate complexity  EVALUATION COMPLEXITY: Moderate  PLAN:  PT FREQUENCY: 2x/week  PT DURATION: 8 weeks  PLANNED INTERVENTIONS: Therapeutic exercises, Therapeutic activity, Neuromuscular re-education, Balance training, Gait training, Patient/Family education, Self Care, Stair training, DME instructions, and Aquatic Therapy  PLAN FOR NEXT SESSION: cont gait training with SPC with rubber quad base, balance training, floor transfer practice, single leg stance, stance progressions, LE strengthening, endurance, balance on compliant surfaces, hip abduction and flexion strengthening   Kerry Fort, PT [facility] [address], [address] [place], [place] [address] [phone]   12/27/2022, 9:43 AM

## 2022-12-27 ENCOUNTER — Encounter: Payer: Self-pay | Admitting: Physical Therapy

## 2022-12-28 ENCOUNTER — Emergency Department (HOSPITAL_COMMUNITY): Payer: PPO

## 2022-12-28 ENCOUNTER — Emergency Department (HOSPITAL_COMMUNITY)
Admission: EM | Admit: 2022-12-28 | Discharge: 2022-12-29 | Disposition: A | Discharge status: Home / Self Care | Payer: PPO | Attending: Emergency Medicine | Admitting: Emergency Medicine

## 2022-12-28 ENCOUNTER — Encounter (HOSPITAL_COMMUNITY): Payer: Self-pay

## 2022-12-28 ENCOUNTER — Other Ambulatory Visit: Payer: Self-pay

## 2022-12-28 DIAGNOSIS — G35 Multiple sclerosis: Secondary | ICD-10-CM | POA: Diagnosis not present

## 2022-12-28 DIAGNOSIS — S0990XA Unspecified injury of head, initial encounter: Secondary | ICD-10-CM | POA: Diagnosis not present

## 2022-12-28 DIAGNOSIS — Z23 Encounter for immunization: Secondary | ICD-10-CM | POA: Diagnosis not present

## 2022-12-28 DIAGNOSIS — M438X5 Other specified deforming dorsopathies, thoracolumbar region: Secondary | ICD-10-CM | POA: Diagnosis not present

## 2022-12-28 DIAGNOSIS — M549 Dorsalgia, unspecified: Secondary | ICD-10-CM | POA: Diagnosis not present

## 2022-12-28 DIAGNOSIS — I700 Atherosclerosis of aorta: Secondary | ICD-10-CM | POA: Insufficient documentation

## 2022-12-28 DIAGNOSIS — N200 Calculus of kidney: Secondary | ICD-10-CM | POA: Diagnosis not present

## 2022-12-28 DIAGNOSIS — I10 Essential (primary) hypertension: Secondary | ICD-10-CM | POA: Diagnosis not present

## 2022-12-28 DIAGNOSIS — R339 Retention of urine, unspecified: Secondary | ICD-10-CM

## 2022-12-28 DIAGNOSIS — Z981 Arthrodesis status: Secondary | ICD-10-CM | POA: Diagnosis not present

## 2022-12-28 DIAGNOSIS — I6782 Cerebral ischemia: Secondary | ICD-10-CM | POA: Diagnosis not present

## 2022-12-28 DIAGNOSIS — N3000 Acute cystitis without hematuria: Secondary | ICD-10-CM | POA: Diagnosis not present

## 2022-12-28 DIAGNOSIS — R42 Dizziness and giddiness: Secondary | ICD-10-CM | POA: Diagnosis not present

## 2022-12-28 DIAGNOSIS — M545 Low back pain, unspecified: Secondary | ICD-10-CM | POA: Diagnosis not present

## 2022-12-28 DIAGNOSIS — R9082 White matter disease, unspecified: Secondary | ICD-10-CM | POA: Diagnosis not present

## 2022-12-28 DIAGNOSIS — M25512 Pain in left shoulder: Secondary | ICD-10-CM | POA: Diagnosis not present

## 2022-12-28 DIAGNOSIS — M19021 Primary osteoarthritis, right elbow: Secondary | ICD-10-CM | POA: Diagnosis not present

## 2022-12-28 DIAGNOSIS — R102 Pelvic and perineal pain: Secondary | ICD-10-CM | POA: Insufficient documentation

## 2022-12-28 DIAGNOSIS — M160 Bilateral primary osteoarthritis of hip: Secondary | ICD-10-CM | POA: Diagnosis not present

## 2022-12-28 DIAGNOSIS — S199XXA Unspecified injury of neck, initial encounter: Secondary | ICD-10-CM | POA: Diagnosis not present

## 2022-12-28 DIAGNOSIS — W19XXXA Unspecified fall, initial encounter: Secondary | ICD-10-CM | POA: Insufficient documentation

## 2022-12-28 DIAGNOSIS — K449 Diaphragmatic hernia without obstruction or gangrene: Secondary | ICD-10-CM | POA: Diagnosis not present

## 2022-12-28 DIAGNOSIS — S0093XA Contusion of unspecified part of head, initial encounter: Secondary | ICD-10-CM | POA: Diagnosis not present

## 2022-12-28 DIAGNOSIS — M5124 Other intervertebral disc displacement, thoracic region: Secondary | ICD-10-CM | POA: Diagnosis not present

## 2022-12-28 DIAGNOSIS — R29898 Other symptoms and signs involving the musculoskeletal system: Secondary | ICD-10-CM

## 2022-12-28 DIAGNOSIS — M19011 Primary osteoarthritis, right shoulder: Secondary | ICD-10-CM | POA: Diagnosis not present

## 2022-12-28 DIAGNOSIS — Z043 Encounter for examination and observation following other accident: Secondary | ICD-10-CM | POA: Diagnosis not present

## 2022-12-28 DIAGNOSIS — M25519 Pain in unspecified shoulder: Secondary | ICD-10-CM | POA: Diagnosis not present

## 2022-12-28 DIAGNOSIS — G4489 Other headache syndrome: Secondary | ICD-10-CM | POA: Diagnosis not present

## 2022-12-28 LAB — COMPREHENSIVE METABOLIC PANEL
ALT: 49 U/L — ABNORMAL HIGH (ref 0–44)
AST: 40 U/L (ref 15–41)
Albumin: 3.6 g/dL (ref 3.5–5.0)
Alkaline Phosphatase: 55 U/L (ref 38–126)
Anion gap: 10 (ref 5–15)
BUN: 31 mg/dL — ABNORMAL HIGH (ref 8–23)
CO2: 26 mmol/L (ref 22–32)
Calcium: 10 mg/dL (ref 8.9–10.3)
Chloride: 103 mmol/L (ref 98–111)
Creatinine, Ser: 0.93 mg/dL (ref 0.44–1.00)
GFR, Estimated: >60 mL/min (ref >60)
Glucose, Bld: 82 mg/dL (ref 70–99)
Potassium: 4.2 mmol/L (ref 3.5–5.1)
Sodium: 139 mmol/L (ref 135–145)
Total Bilirubin: 0.5 mg/dL (ref <1.2)
Total Protein: 6.9 g/dL (ref 6.5–8.1)

## 2022-12-28 LAB — CBC WITH DIFFERENTIAL/PLATELET
Abs Immature Granulocytes: 0.39 10*3/uL — ABNORMAL HIGH (ref 0.00–0.07)
Basophils Absolute: 0 10*3/uL (ref 0.0–0.1)
Basophils Relative: 0 %
Eosinophils Absolute: 0 10*3/uL (ref 0.0–0.5)
Eosinophils Relative: 0 %
HCT: 35.4 % — ABNORMAL LOW (ref 36.0–46.0)
Hemoglobin: 11.1 g/dL — ABNORMAL LOW (ref 12.0–15.0)
Immature Granulocytes: 4 %
Lymphocytes Relative: 13 %
Lymphs Abs: 1.4 10*3/uL (ref 0.7–4.0)
MCH: 30.9 pg (ref 26.0–34.0)
MCHC: 31.4 g/dL (ref 30.0–36.0)
MCV: 98.6 fL (ref 80.0–100.0)
Monocytes Absolute: 0.9 10*3/uL (ref 0.1–1.0)
Monocytes Relative: 8 %
Neutro Abs: 8.2 10*3/uL — ABNORMAL HIGH (ref 1.7–7.7)
Neutrophils Relative %: 75 %
Platelets: 263 10*3/uL (ref 150–400)
RBC: 3.59 MIL/uL — ABNORMAL LOW (ref 3.87–5.11)
RDW: 13.7 % (ref 11.5–15.5)
WBC: 10.9 10*3/uL — ABNORMAL HIGH (ref 4.0–10.5)
nRBC: 0 % (ref 0.0–0.2)

## 2022-12-28 LAB — URINALYSIS, W/ REFLEX TO CULTURE (INFECTION SUSPECTED)
Bilirubin Urine: NEGATIVE
Glucose, UA: NEGATIVE mg/dL
Hgb urine dipstick: NEGATIVE
Ketones, ur: NEGATIVE mg/dL
Nitrite: POSITIVE — AB
Protein, ur: NEGATIVE mg/dL
Specific Gravity, Urine: 1.013 (ref 1.005–1.030)
pH: 7 (ref 5.0–8.0)

## 2022-12-28 LAB — TSH: TSH: 0.948 u[IU]/mL (ref 0.350–4.500)

## 2022-12-28 LAB — CK: Total CK: 124 U/L (ref 38–234)

## 2022-12-28 MED ORDER — HYDROMORPHONE HCL 1 MG/ML IJ SOLN
1.0000 mg | Freq: Once | INTRAMUSCULAR | Status: AC
  Administered 2022-12-28: 1 mg via INTRAVENOUS
  Filled 2022-12-28: qty 1

## 2022-12-28 MED ORDER — GADOBUTROL 1 MMOL/ML IV SOLN
5.0000 mL | Freq: Once | INTRAVENOUS | Status: AC | PRN
  Administered 2022-12-28: 5 mL via INTRAVENOUS

## 2022-12-28 MED ORDER — FENTANYL CITRATE PF 50 MCG/ML IJ SOSY
50.0000 ug | PREFILLED_SYRINGE | Freq: Once | INTRAMUSCULAR | Status: AC
  Administered 2022-12-28: 50 ug via INTRAVENOUS
  Filled 2022-12-28: qty 1

## 2022-12-28 MED ORDER — SODIUM CHLORIDE 0.9 % IV SOLN
1.0000 g | Freq: Once | INTRAVENOUS | Status: AC
  Administered 2022-12-28: 1 g via INTRAVENOUS
  Filled 2022-12-28: qty 10

## 2022-12-28 MED ORDER — ONDANSETRON HCL 4 MG/2ML IJ SOLN
4.0000 mg | Freq: Once | INTRAMUSCULAR | Status: AC
  Administered 2022-12-28: 4 mg via INTRAVENOUS
  Filled 2022-12-28: qty 2

## 2022-12-28 MED ORDER — IOHEXOL 350 MG/ML SOLN
75.0000 mL | Freq: Once | INTRAVENOUS | Status: AC | PRN
  Administered 2022-12-28: 75 mL via INTRAVENOUS

## 2022-12-28 MED ORDER — AMLODIPINE BESYLATE 5 MG PO TABS
5.0000 mg | ORAL_TABLET | Freq: Once | ORAL | Status: AC
  Administered 2022-12-28: 5 mg via ORAL
  Filled 2022-12-28: qty 1

## 2022-12-28 MED ORDER — TETANUS-DIPHTH-ACELL PERTUSSIS 5-2.5-18.5 LF-MCG/0.5 IM SUSY
0.5000 mL | PREFILLED_SYRINGE | Freq: Once | INTRAMUSCULAR | Status: AC
  Administered 2022-12-28: 0.5 mL via INTRAMUSCULAR
  Filled 2022-12-28: qty 0.5

## 2022-12-28 NOTE — ED Provider Notes (Signed)
EMERGENCY DEPARTMENT AT [facility] Provider Note   CSN: [id number] Arrival date & time: 12/28/22  4098     History  No chief complaint on file.   Katrina Rivera is a 77 y.o. female.  The history is provided by the patient, medical records, the EMS personnel and the spouse. No language interpreter was used.  Fall This is a new problem. The current episode started 3 to 5 hours ago. The problem occurs rarely. The problem has not changed since onset.Associated symptoms include headaches. Pertinent negatives include no chest pain, no abdominal pain and no shortness of breath. Nothing aggravates the symptoms. Nothing relieves the symptoms. She has tried nothing for the symptoms. The treatment provided no relief.       Home Medications Prior to Admission medications   Medication Sig Start Date End Date Taking? Authorizing Provider  acetaminophen (TYLENOL) 325 MG tablet Take 1-2 tablets (325-650 mg total) by mouth every 4 (four) hours as needed for mild pain. 08/29/22   Setzer, Lynnell Jude, PA-C  alfuzosin (UROXATRAL) 10 MG 24 hr tablet Take 10 mg by mouth daily with breakfast.    [provider]  amLODipine (NORVASC) 2.5 MG tablet Take 1 tablet (2.5 mg total) by mouth daily. 08/29/22   Setzer, Lynnell Jude, PA-C  buPROPion (WELLBUTRIN SR) 150 MG 12 hr tablet Take 150 mg by mouth daily.    [provider]  cephALEXin (KEFLEX) 250 MG capsule Take 250 mg by mouth every other day. 06/04/22   [provider]  cholecalciferol (VITAMIN D3) 25 MCG (1000 UNIT) tablet Take 1,000 Units by mouth every morning.    [provider]  clonazePAM (KLONOPIN) 0.5 MG tablet Take 1 tablet (0.5 mg total) by mouth at bedtime as needed for anxiety. Patient taking differently: Take 0.5 mg by mouth at bedtime. 08/04/22   Glean Salvo, NP  CRANBERRY PO Take 1 tablet by mouth 2 (two) times daily.    [provider]  dicyclomine (BENTYL) 10 MG capsule Take 20  mg by mouth 3 (three) times daily before meals.    [provider]  DULoxetine (CYMBALTA) 60 MG capsule Take 1 capsule (60 mg total) by mouth daily. 05/17/22   Levert Feinstein, MD  ferrous sulfate 325 (65 FE) MG tablet Take 325 mg by mouth daily with breakfast.    [provider]  fludrocortisone (FLORINEF) 0.1 MG tablet Take 1 tablet daily 11/23/22   Thapa, [place], MD  fluticasone (CUTIVATE) 0.05 % cream Apply 1 application  topically 2 (two) times daily as needed for irritation (Rosacea). 06/09/21   [provider]  fluticasone (FLONASE) 50 MCG/ACT nasal spray Place 1 spray into both nostrils daily as needed for allergies or rhinitis.    [provider]  gabapentin (NEURONTIN) 300 MG capsule Take 3 capsules (900 mg total) by mouth at bedtime. 08/29/22   Setzer, Lynnell Jude, PA-C  gabapentin (NEURONTIN) 300 MG capsule Take 1 capsule (300 mg total) by mouth daily at 6 (six) AM. 08/29/22   Setzer, Lynnell Jude, PA-C  HYDROcodone-acetaminophen (NORCO) 7.5-325 MG tablet Take 1 tablet by mouth every 4 (four) hours as needed for moderate pain ((score 4 to 6)). Patient not taking: Reported on 10/19/2022 08/29/22   Milinda Antis, PA-C  methylPREDNISolone (MEDROL) 4 MG tablet as directed Orally 1 tablet AM and 1/2 tablet afternoon and take additional in case of illness as instructed. 11/23/22   Thapa, [place], MD  Multiple Vitamin (MULTIVITAMIN) tablet Take  1 tablet by mouth in the morning.    [provider]  oxyCODONE-acetaminophen (PERCOCET) 10-325 MG tablet Take 1 tablet by mouth every 4 (four) hours as needed for pain.    [provider]  oxyCODONE-acetaminophen (PERCOCET/ROXICET) 5-325 MG tablet Take 1 tablet by mouth every 6 (six) hours as needed for severe pain. Patient not taking: Reported on 11/30/2022    [provider]  Polyethyl Glycol-Propyl Glycol (SYSTANE OP) Place 2 drops into both eyes daily as needed (for dry eyes).    [provider]   polyethylene glycol (MIRALAX) 17 g packet Take 17 g by mouth 2 (two) times daily. 17 grams in 6 oz of favorite drink twice a day until bowel movement.  LAXITIVE.  Restart if two days since last bowel movement Patient taking differently: Take 17 g by mouth daily as needed for moderate constipation. 08/24/20   Shepperson, Kirstin, PA-C  Probiotic Product (PROBIOTIC PO) Take 1 capsule by mouth every morning.    [provider]  promethazine (PHENERGAN) 25 MG tablet Take 25 mg by mouth every 6 (six) hours as needed for nausea or vomiting.    [provider]  tiZANidine (ZANAFLEX) 2 MG tablet Take 2 tablets (4 mg total) by mouth every 8 (eight) hours as needed for muscle spasms. Patient taking differently: Take 4 mg by mouth at bedtime. 06/23/22   Arman Bogus, MD      Allergies    Amoxicillin, Aspirin, Azithromycin, Doxycycline hyclate, Hydroxyzine hcl, Buspirone hcl, and Mirtazapine    Review of Systems   Review of Systems  Constitutional:  Positive for fatigue. Negative for chills and fever.  HENT:  Negative for congestion.   Eyes:  Negative for visual disturbance.  Respiratory:  Negative for cough, chest tightness, shortness of breath and wheezing.   Cardiovascular:  Negative for chest pain, palpitations and leg swelling.  Gastrointestinal:  Negative for abdominal pain, constipation, diarrhea, nausea and vomiting.  Genitourinary:  Negative for dysuria, flank pain and frequency.  Musculoskeletal:  Positive for back pain and neck pain. Negative for neck stiffness.  Skin:  Positive for wound. Negative for rash.  Neurological:  Positive for dizziness, weakness and headaches. Negative for speech difficulty, light-headedness and numbness.  Psychiatric/Behavioral:  Negative for agitation.   All other systems reviewed and are negative.   Physical Exam Updated Vital Signs There were no vitals taken for this visit. Physical Exam Vitals and nursing note reviewed.   Constitutional:      General: She is not in acute distress.    Appearance: She is well-developed. She is not ill-appearing, toxic-appearing or diaphoretic.  HENT:     Head: Atraumatic.     Right Ear: External ear normal.     Left Ear: External ear normal.     Nose: Nose normal.     Mouth/Throat:     Pharynx: No oropharyngeal exudate or posterior oropharyngeal erythema.  Eyes:     Extraocular Movements: Extraocular movements intact.     Conjunctiva/sclera: Conjunctivae normal.     Pupils: Pupils are equal, round, and reactive to light.  Cardiovascular:     Rate and Rhythm: Normal rate.     Heart sounds: No murmur heard. Pulmonary:     Effort: No respiratory distress.     Breath sounds: No stridor. No wheezing, rhonchi or rales.  Chest:     Chest wall: No tenderness.  Abdominal:     General: Abdomen is flat. There is no distension.  Tenderness: There is no abdominal tenderness. There is no right CVA tenderness, left CVA tenderness, guarding or rebound.  Musculoskeletal:        General: Tenderness present.     Cervical back: Normal range of motion and neck supple. Tenderness present.     Right lower leg: No edema.     Left lower leg: No edema.  Skin:    General: Skin is warm.     Capillary Refill: Capillary refill takes less than 2 seconds.     Findings: No erythema or rash.  Neurological:     Mental Status: She is alert and oriented to person, place, and time.     Motor: Weakness present. No abnormal muscle tone.     Deep Tendon Reflexes: Reflexes are normal and symmetric.     Comments: Symmetric weakness in both legs could barely get up off the bed.  Sensation symmetric with some chronic numbness reported.  Intact grip strength and sensation in arms.  Symmetric smile.  Clear speech.  Pupils symmetric and reactive with normal extraocular movements not indicative of entrapment.  Tenderness on right forehead and right temporal area from fall.    Tenderness of entirety of  back and neck.  Psychiatric:        Mood and Affect: Mood normal.     ED Results / Procedures / Treatments   Labs (all labs ordered are listed, but only abnormal results are displayed) Labs Reviewed  CBC WITH DIFFERENTIAL/PLATELET - Abnormal; Notable for the following components:      Result Value   WBC 10.9 (*)    RBC 3.59 (*)    Hemoglobin 11.1 (*)    HCT 35.4 (*)    Neutro Abs 8.2 (*)    Abs Immature Granulocytes 0.39 (*)    All other components within normal limits  COMPREHENSIVE METABOLIC PANEL - Abnormal; Notable for the following components:   BUN 31 (*)    ALT 49 (*)    All other components within normal limits  URINALYSIS, W/ REFLEX TO CULTURE (INFECTION SUSPECTED) - Abnormal; Notable for the following components:   Nitrite POSITIVE (*)    Leukocytes,Ua TRACE (*)    Bacteria, UA FEW (*)    All other components within normal limits  URINE CULTURE  TSH  CK    EKG EKG Interpretation Date/Time:  Wednesday December 28 2022 08:19:43 EST Ventricular Rate:  77 PR Interval:  144 QRS Duration:  79 QT Interval:  353 QTC Calculation: 400 R Axis:   -17  Text Interpretation: Sinus rhythm Consider right atrial enlargement LVH by voltage Minimal ST elevation, inferior leads when compared to prior, overall similar appearance. No STEMI Confirmed by Theda Belfast ([id number]) on 12/28/2022 8:47:58 AM  Radiology DG Hip Unilat W or Wo Pelvis 2-3 Views Right  Result Date: 12/28/2022 CLINICAL DATA:  Fall. EXAM: DG HIP (WITH OR WITHOUT PELVIS) 2-3V RIGHT COMPARISON:  None Available. FINDINGS: Pelvis is intact with normal and symmetric sacroiliac joints. No acute fracture or dislocation. No aggressive osseous lesion. Visualized sacral arcuate lines are unremarkable. Unremarkable symphysis pubis. There are mild-to-moderate degenerative changes of bilateral hip joints characterized by mild-to-moderately reduced joint space and osteophytosis of the superior acetabulum. No radiopaque  foreign bodies. Partially seen lower lumbar spinal fixation hardware and a battery pack. 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 IMPRESSION: *No acute osseous abnormality of the pelvis or right hip joint.  Electronically Signed   By: Jules Schick M.D.   On: 12/28/2022 10:01   DG Shoulder Left  Result Date: 12/28/2022 CLINICAL DATA:  Fall.  Left shoulder pain. EXAM: LEFT SHOULDER - 2+ VIEW COMPARISON:  None Available. FINDINGS: No acute fracture or dislocation. No aggressive osseous lesion. Glenohumeral and acromioclavicular joints are normal in alignment and exhibit no significant degenerative changes. No soft tissue swelling. No radiopaque foreign bodies. IMPRESSION: *No acute osseous abnormality of the left shoulder joint. Electronically Signed   By: Jules Schick M.D.   On: 12/28/2022 09:59   DG Shoulder Right  Result Date: 12/28/2022 CLINICAL DATA:  Fall.  Pain over right shoulder. EXAM: RIGHT SHOULDER - 2+ VIEW; RIGHT HUMERUS - 2+ VIEW COMPARISON:  None Available. FINDINGS: No acute fracture or dislocation. No aggressive osseous lesion. Glenohumeral and acromioclavicular joints are normal in alignment. There are mild degenerative changes of glenohumeral, acromioclavicular and elbow joints without significant joint space reduction. There is mild osteophytosis. No soft tissue swelling. No radiopaque foreign bodies. IMPRESSION: *No acute osseous abnormality of the right shoulder or humerus. Electronically Signed   By: Jules Schick M.D.   On: 12/28/2022 09:57   DG Humerus Right  Result Date: 12/28/2022 CLINICAL DATA:  Fall.  Pain over right shoulder. EXAM: RIGHT SHOULDER - 2+ VIEW; RIGHT HUMERUS - 2+ VIEW COMPARISON:  None Available. FINDINGS: No acute fracture or dislocation. No aggressive osseous lesion. Glenohumeral and acromioclavicular  joints are normal in alignment. There are mild degenerative changes of glenohumeral, acromioclavicular and elbow joints without significant joint space reduction. There is mild osteophytosis. No soft tissue swelling. No radiopaque foreign bodies. IMPRESSION: *No acute osseous abnormality of the right shoulder or humerus. Electronically Signed   By: Jules Schick M.D.   On: 12/28/2022 09:57    Procedures Procedures    Medications Ordered in ED Medications  Tdap (BOOSTRIX) injection 0.5 mL (0.5 mLs Intramuscular Given 12/28/22 0855)  fentaNYL (SUBLIMAZE) injection 50 mcg (50 mcg Intravenous Given 12/28/22 0936)  iohexol (OMNIPAQUE) 350 MG/ML injection 75 mL (75 mLs Intravenous Contrast Given 12/28/22 1208)  fentaNYL (SUBLIMAZE) injection 50 mcg (50 mcg Intravenous Given 12/28/22 1438)    ED Course/ Medical Decision Making/ A&P                                 Medical Decision Making Amount and/or Complexity of Data Reviewed Labs: ordered. Radiology: ordered.  Risk Prescription drug management.    NADELYN CHRISP is a 77 y.o. female with a past medical history significant for MS, chronic adrenal insufficiency, neuropathies, hypertension, fibromyalgia, previous small bowel obstruction, previous lumbar spine fractures status post recent injections last week who presents with fall and new unsteadiness.  According to patient and family, since Friday, patient has been feeling unsteady and dizzy at times leading to near falls.  She reports that sometime overnight early this morning, she fell out of her bed and was stuck between the nightstand and the bed.  She is having pain in her head, neck, entire back, right hip, both shoulders, and right upper arm.  She sustained a skin tear to her right forearm that is hemostatic.  She says that for the last week or so she has had bilateral weakness in her legs that she thinks is contrary to her symptoms and says she was going to the left unintentionally.   She also says some of his dizziness and weakness is similar  to when she has had MS flares in the past.  Otherwise she denies any fevers, chills, congestion, cough, nausea, vomiting, constipation, diarrhea, or urinary changes..  She has chronic bruising that is unchanged she reports.  On exam, lungs clear.  Chest nontender.  Abdomen nontender.  Patient has tenderness in her forehead, scalp, right temporal area, neck, entire back, bilateral shoulders, right humerus, skin tear to the right forearm, and tenderness to her right hip.  Patient has baseline numbness in both legs she reports and has the ability to slightly raise her legs off the bed but reports she is feeling weaker than normal symmetrically.  Tenderness all over her back.  Pupils symmetric and reactive with normal extract movements.  Clear speech.  Clinically I am concerned about separate problems.  I am concerned we need to get imaging to rule out traumatic injuries from the fall overnight so we will get CT of the head, neck, and chest/abdomen/pelvis as well as x-rays of the right hip, bilateral shoulders, and right upper arm.  Aside from that, I am also concerned about the unsteadiness, bilateral leg weakness, and dizziness.  Will get workup to look for electrolyte troubles, occult infection, and will likely also discussed with neurology if we need to get MRIs to rule out MS flare as well.  For the skin tear, will get nursing to help wash it out and dress it.  Does not appear amenable to sutures at this time due to fragile skin.  Will update tetanus.  Anticipate reassessment after workup to determine disposition.    2:06 PM Workup continues to return.  Patient does have a mild leukocytosis and improved anemia.  Her urinalysis does show nitrites leukocytes and bacteria and now she is having some lower abdominal discomfort and burning sensation.  Suspect this is a UTI.  Metabolic panel overall reassuring.  TSH normal.  CK not elevated.  X-rays  of the shoulders, humerus, and pelvis did not show acute fractures.  Still waiting on the results of the CT head neck and chest abdomen pelvis however I spoke to neurology about the concern for weakness, dizziness, and history of MS with pain and they did recommend MRI brain, C-spine, and T-spine with and without contrast to look for MS flare.  Patient will await results of CTs and MRI but she will need antibiotics for the UTI if she is eventually felt stable for discharge home.  Care will be transferred oncoming team to wait for imaging results.  MRI is ordered as husband is going back home to get the remote for her stimulator so she can get the MRI.  Care will be transferred to oncoming team to wait for CT results, MRI results, and reassessment to determine if she is safe to go home or not.  Anticipate discharge with antibiotics to cover the UTI if she is able to go home.         Final Clinical Impression(s) / ED Diagnoses Final diagnoses:  Fall, initial encounter  Injury of head, initial encounter  Dizziness  Back pain, unspecified back location, unspecified back pain laterality, unspecified chronicity  Bilateral leg weakness  Acute cystitis without hematuria    Clinical Impression: 1. Fall, initial encounter   2. Injury of head, initial encounter   3. Dizziness   4. Back pain, unspecified back location, unspecified back pain laterality, unspecified chronicity   5. Bilateral leg weakness   6. Acute cystitis without hematuria     Disposition: Care will be transferred to  oncoming team to wait for CT results, MRI results, and reassessment to determine if she is safe to go home or not.  Anticipate discharge with antibiotics to cover the UTI if she is able to go home.  This note was prepared with assistance of Conservation officer, historic buildings. Occasional wrong-word or sound-a-like substitutions may have occurred due to the inherent limitations of voice recognition software.      Jeimy Bickert, Canary Brim, MD 12/28/22 1536

## 2022-12-28 NOTE — ED Provider Notes (Signed)
Patient signed out to me by previous provider. Please refer to their note for full HPI.  Briefly this is a 77 year old female who presented for mechanical fall.  Also concern for worsening weakness of the legs and some dizziness.  Concern for possible MS flare.  Patient was signed out pending imaging.    CT trauma imaging was negative.  We are pending MRI results to evaluate for MS exacerbation and disposition.  Patient signed out pending MRI results.   Rozelle Logan, DO 12/28/22 2319

## 2022-12-28 NOTE — ED Triage Notes (Addendum)
Pt. Bib [facility]. Husband found pt on floor and she had fallen out of bed. Pt. Had hit her bed on nightstand and has a contusion and bruise on head . Pt also has a skin tear to right elbow. Pt. Is c/o of headache, dizziness and nausea and bilateral shoulder pain,back pain, and right hip pain. Pt. Has c-collar placed. Pt. States she is not on blood thinners.

## 2022-12-28 NOTE — ED Notes (Signed)
Pt's greatest source of pain is her lower abdomen. Pt's abdomen is distended and she has not urinated per husband. Pt has this issue often and neurologist suggested she may have to do self cath eventually, Bladder scan performed and pt is retaining urine. Dr Wilkie Aye aware and foley order placed

## 2022-12-28 NOTE — ED Notes (Signed)
Patient transported to MRI 

## 2022-12-29 ENCOUNTER — Ambulatory Visit: Payer: PPO | Admitting: Physical Therapy

## 2022-12-29 MED ORDER — SULFAMETHOXAZOLE-TRIMETHOPRIM 800-160 MG PO TABS: 1.0000 | ORAL_TABLET | Freq: Two times a day (BID) | ORAL | 0 refills | Status: AC

## 2022-12-29 NOTE — Discharge Instructions (Addendum)
You have been seen and discharged from the emergency department.  Your CT imaging showed no acute injury.  Your MRI imaging showed no new lesions or signs of MS flare.  You were found to have urinary retention, Foley was placed.  You are also found to have a urinary tract infection.  You were given antibiotics, start your oral antibiotic the morning of 11/21.  Follow-up with your neurology provider in regards to MS symptoms/urinary retention.  You have also been referred to urology.  Follow-up with your primary provider for further evaluation and further care. Take home medications as prescribed. If you have any worsening symptoms or further concerns for your health please return to an emergency department for further evaluation.

## 2022-12-30 ENCOUNTER — Encounter: Payer: PPO | Admitting: Physical Medicine and Rehabilitation

## 2022-12-30 LAB — URINE CULTURE: Culture: >=100000 — AB

## 2022-12-31 ENCOUNTER — Telehealth (HOSPITAL_BASED_OUTPATIENT_CLINIC_OR_DEPARTMENT_OTHER): Payer: Self-pay | Admitting: *Deleted

## 2022-12-31 NOTE — Telephone Encounter (Signed)
Post ED Visit - Positive Culture Follow-up: Successful Patient Follow-Up  Culture assessed and recommendations reviewed by:  []  Enzo Bi, Pharm.D. []  Celedonio Miyamoto, Pharm.D., BCPS AQ-ID []  Garvin Fila, Pharm.D., BCPS []  Georgina Pillion, Pharm.D., BCPS []  [place], [address].D., BCPS, AAHIVP []  Estella Husk, Pharm.D., BCPS, AAHIVP []  Lysle Pearl, PharmD, BCPS []  Phillips Climes, PharmD, BCPS []  Agapito Games, PharmD, BCPS [x]  Delmar Landau, PharmD  Positive urine culture  []  Patient discharged without antimicrobial prescription and treatment is now indicated [x]  Organism is resistant to prescribed ED discharge antimicrobial []  Patient with positive blood cultures  Changes discussed with ED provider: Riki Sheer, PA New antibiotic prescription Ciprofloxacin 250mg  q 12 hrs for 7 days. Qty 14. 0 Refills Called to [facility], [place], [place]  Contacted patient, date 12/31/22 , time 1228   Patsey Berthold 12/31/2022, 12:29 PM

## 2022-12-31 NOTE — Progress Notes (Addendum)
ED Antimicrobial Stewardship Positive Culture Follow Up   Katrina Rivera is an 77 y.o. female who presented to [facility] on 12/28/2022 with a chief complaint of  Chief Complaint  Patient presents with   Fall    Recent Results (from the past 720 hour(s))  Urine Culture     Status: Abnormal   Collection Time: 12/28/22  9:25 AM   Specimen: Urine, Random  Result Value Ref Range Status   Specimen Description URINE, RANDOM  Final   Special Requests   Final    NONE Reflexed from [id number] Performed at [facility], [address] [place], [place] [address]    Culture >=100,000 COLONIES/mL PSEUDOMONAS AERUGINOSA (A)  Final   Report Status 12/30/2022 FINAL  Final   Organism ID, Bacteria PSEUDOMONAS AERUGINOSA (A)  Final      Susceptibility   Pseudomonas aeruginosa - MIC*    CEFTAZIDIME 4 SENSITIVE Sensitive     CIPROFLOXACIN <=0.25 SENSITIVE Sensitive     GENTAMICIN <=1 SENSITIVE Sensitive     IMIPENEM 2 SENSITIVE Sensitive     PIP/TAZO 8 SENSITIVE Sensitive ug/mL    CEFEPIME 4 SENSITIVE Sensitive     * >=100,000 COLONIES/mL PSEUDOMONAS AERUGINOSA    [x]  Treated with bactrim, organism resistant to prescribed antimicrobial  STOP BACTRIM START: Ciprofloxacin 250 mg every 12 hours x 7 days (qty 14; refills 0)  ED Provider: Riki Sheer PA  Delmar Landau, PharmD, BCPS 12/31/2022 12:01 PM ED Clinical Pharmacist -  [phone]

## 2023-01-02 ENCOUNTER — Ambulatory Visit: Payer: PPO | Admitting: Physical Therapy

## 2023-01-03 DIAGNOSIS — N319 Neuromuscular dysfunction of bladder, unspecified: Secondary | ICD-10-CM | POA: Diagnosis not present

## 2023-01-03 DIAGNOSIS — R3914 Feeling of incomplete bladder emptying: Secondary | ICD-10-CM | POA: Diagnosis not present

## 2023-01-04 ENCOUNTER — Ambulatory Visit: Payer: PPO | Admitting: Physical Therapy

## 2023-01-04 DIAGNOSIS — R2689 Other abnormalities of gait and mobility: Secondary | ICD-10-CM

## 2023-01-04 DIAGNOSIS — R2681 Unsteadiness on feet: Secondary | ICD-10-CM

## 2023-01-04 DIAGNOSIS — R262 Difficulty in walking, not elsewhere classified: Secondary | ICD-10-CM

## 2023-01-04 DIAGNOSIS — R269 Unspecified abnormalities of gait and mobility: Secondary | ICD-10-CM

## 2023-01-04 DIAGNOSIS — G35 Multiple sclerosis: Secondary | ICD-10-CM

## 2023-01-04 DIAGNOSIS — M6281 Muscle weakness (generalized): Secondary | ICD-10-CM

## 2023-01-04 DIAGNOSIS — R29818 Other symptoms and signs involving the nervous system: Secondary | ICD-10-CM

## 2023-01-04 NOTE — Therapy (Signed)
OUTPATIENT PHYSICAL THERAPY NEURO TREATMENT 20th VISIT PROGRESS NOTE   Patient Name: Katrina Rivera MRN: [id number] DOB:1945/12/09, 77 y.o., female Today's Date: 01/04/2023   Physical Therapy Progress Note   Dates of Reporting Period:09/20/22-01/04/23  See Note below for Objective Data and Assessment of Progress/Goals.  Thank you for the referral of this patient. Beverely Low, SPT   PCP: Noberto Retort, MD REFERRING PROVIDER: Charlton Amor, PA-C  END OF SESSION:  PT End of Session - 01/04/23 1146     Visit Number 20    Number of Visits 27    Date for PT Re-Evaluation 01/13/23    Authorization Type [employer]    Authorization Time Period 09-20-22 - 12-08-22; 11-21-22 - 02-07-23    Progress Note Due on Visit 20    PT Start Time 1147    PT Stop Time 1230    PT Time Calculation (min) 43 min    Equipment Utilized During Treatment Gait belt    Activity Tolerance Patient tolerated treatment well    Behavior During Therapy WFL for tasks assessed/performed                      Past Medical History:  Diagnosis Date   Addison's disease (HCC)    takes Solu Cortef daily   Anemia    takes Ferrous Sulfate daily   Anxiety    takes Xanax nightly   Arthritis    Chronic back pain    stenosis   Depression    takes Cymbalta daily   Dizziness    if b/p drops    Fibromyalgia    History of blood transfusion    no abnormal reaction noted   History of bronchitis    many yrs ago    Hypokalemia    takes Potassium daily   Hypotension    takes Florinef daily   IBS (irritable bowel syndrome)    takes Align daily   Insomnia    takes Trazodone nightly   Joint pain    Multiple sclerosis (HCC)    doesn't take any meds   Multiple sclerosis (HCC)    New onset headache 12/22/2021   Nocturia    Osteoporosis    Palpitations    Peripheral neuropathy    Primary localized osteoarthritis of left knee 08/11/2020   Restless leg syndrome    Seasonal allergies     takes Zyrtec daily;uses Flonase daily as needed   Syncope    when missed methylprednisolone dose   Urinary frequency    takes Flomax daily   Weakness    numbness and tingling   Past Surgical History:  Procedure Laterality Date   ABDOMINAL HYSTERECTOMY  02/07/1986   APPENDECTOMY  02/07/1986   CESAREAN SECTION  1973/1977   x2   CHOLECYSTECTOMY  02/08/1995   COLECTOMY  02/08/1988   COLONOSCOPY     ESOPHAGOGASTRODUODENOSCOPY     EYE SURGERY     bilateral - /w IOL- cataracts   FRACTURE SURGERY Right    rods and screws-right leg   LUMBAR LAMINECTOMY/DECOMPRESSION MICRODISCECTOMY Left 01/24/2013   Procedure: LUMBAR FIVE TO SACRAL ONE LUMBAR LAMINECTOMY/DECOMPRESSION MICRODISCECTOMY 1 LEVEL;  Surgeon: Tia Alert, MD;  Location: [facility];  Service: Neurosurgery;  Laterality: Left;   MAXIMUM ACCESS (MAS)POSTERIOR LUMBAR INTERBODY FUSION (PLIF) 1 LEVEL N/A 06/18/2014   Procedure: MAXIMUM ACCESS SURGERY POSTERIOR LUMBAR INTERBODY FUSION LUMBAR FIVE TO SACRAL ONE ;  Surgeon: Tia Alert, MD;  Location: [facility];  Service: Neurosurgery;  Laterality: N/A;   SPINAL CORD STIMULATOR BATTERY EXCHANGE Right 07/30/2021   Procedure: Spinal cord stimulator battery replacement;  Surgeon: Tia Alert, MD;  Location: [facility] OR;  Service: Neurosurgery;  Laterality: Right;   SPINAL CORD STIMULATOR IMPLANT     TOTAL KNEE ARTHROPLASTY Left 08/24/2020   Procedure: TOTAL KNEE ARTHROPLASTY;  Surgeon: Salvatore Marvel, MD;  Location: [facility];  Service: Orthopedics;  Laterality: Left;   Patient Active Problem List   Diagnosis Date Noted   Myofascial pain dysfunction syndrome 11/16/2022   Vertigo 11/16/2022   Lumbar burst fracture (HCC) 08/23/2022   Lumbar vertebral fracture (HCC) 08/19/2022   Chronic migraine w/o aura w/o status migrainosus, not intractable 03/29/2022   Mixed hyperlipidemia 12/27/2021   Chest pain of uncertain etiology 12/27/2021   New onset headache 12/22/2021   S/P lumbar fusion  07/30/2021   Primary localized osteoarthritis of left knee 08/11/2020   Atrophic vaginitis 08/11/2020   Menopausal symptom 08/11/2020   Restless leg syndrome    Chronic back pain    Low back pain without sciatica 11/01/2018   Essential hypertension    SBO (small bowel obstruction) (HCC) 09/01/2016   Fibromyalgia    Abdominal pain, vomiting, and diarrhea    Dehydration    Somnolence, daytime 10/22/2015   Fatigue 10/22/2015   Chronic leg pain 06/29/2015   S/P lumbar spinal fusion 06/18/2014   Abnormality of gait 02/18/2013   S/P lumbar microdiscectomy 01/24/2013   Hypokalemia 12/24/2012   Sweating abnormality 10/19/2012   Dysautonomia orthostatic hypotension syndrome 08/16/2012   Hereditary and idiopathic peripheral neuropathy 08/16/2012   Chronic adrenal insufficiency (HCC) 08/16/2012   MS (multiple sclerosis) (HCC) 08/15/2012    ONSET DATE: 08-17-22  REFERRING DIAG: G35 (ICD-10-CM) - Multiple sclerosis   Diagnosis  S32.039D (ICD-10-CM) - Closed fracture of third lumbar vertebra with routine healing, unspecified fracture morphology, subsequent encounter    THERAPY DIAG:  Other abnormalities of gait and mobility  Unsteadiness on feet  Muscle weakness (generalized)  Other symptoms and signs involving the nervous system  MS (multiple sclerosis) (HCC)  Difficulty in walking, not elsewhere classified  Abnormality of gait  Rationale for Evaluation and Treatment: Rehabilitation  SUBJECTIVE:                                                                                                                                                                                             SUBJECTIVE STATEMENT:  Pt went by ambulance to the hospital since last seen here. "I remember I hit something hard, and then I don't remember", "a wad of skin was on the floor". Reports she  had a thorough checkup for broken bones, of her spine, and for MS flare. Pt found to have a UTI that was causing  the falls and unsteadiness. Reports catheter was removed yesterday, will have a test for bladder voiding on the 10th. Pt is fearful that if the bladder voiding test goes poorly she will have to use the catheter again: "There's no way I could cath myself, I'd have it come out my nose". Pt reports feeling better this date. Is upset to return to using the RW. Has a couple more days left of the antibiotic.   Shows SPT and PT bandaged RLE wound on anterior shin that appears to be bright red in nature. Pt w/ healing bruise on upper R forehead near hairline.   Pt accompanied by: self  PERTINENT HISTORY: spinal cord stimulator implant; MS diagnosed in 1984; s/p 2 back surgeries  Per Chart note "Brief HPI:   BLANCHIE CROWE is a 77 y.o. female with a history of relapsing remitting multiple sclerosis who was evaluated by Dr. Yetta Barre regarding gait abnormality, chronic low back pain and status post lumbar fusion May 2024. She has reportedly fallen 4 times at home after PLIF of L3-5. She was admitted 08/17/2022 for scheduled surgery of L3 spinal fracture. She underwent posterior fixation L2-L4 using NuVasive cortical pedicle screws, arthrodesis and removal of segmental fixation L3-L5 by Dr. Yetta Barre. PT OT evaluations carried out. Incision is healed without signs of infection. She is appropriate back soreness. She has no complaints of leg pain or new numbness, tingling or weakness per Dr. Barnett Applebaum note today. Signs are stable and she is tolerating regular diet. Other past medical history includes restless leg syndrome, anxiety, headaches with migraine features, fibromyalgia, hypotension, anemia (acute on chronic)."    PAIN:  Are you having pain? Yes: NPRS scale: 8/10 Pain location: low back pain into legs)  Pain description: tightness, throbbing, sore Aggravating factors: no specific Relieving factors: rest (TLSO has been discontinued by MD)   PRECAUTIONS: Back  - No bending, lifting >10#, twisting, reaching  overhead; Dr Yetta Barre responded with "regular back precautions" via inbasket message inquiring about precautions  RED FLAGS: None   WEIGHT BEARING RESTRICTIONS: No  FALLS: Has patient fallen in last 6 months? Yes. Number of falls 4  LIVING ENVIRONMENT: Lives with: lives with their spouse Lives in: House/apartment Stairs: Yes: External: 1 steps; planning on having rail installed Has following equipment at home: Walker - 2 wheeled, shower chair, and Grab bars  PLOF: Independent with household mobility with device, Requires assistive device for independence, and Needs assistance with ADLs  PATIENT GOALS: "walk without walker and back brace and get back to where I was before"  OBJECTIVE:   DIAGNOSTIC FINDINGS: IMPRESSION: 1. Unhealed fracture of the posterosuperior L3 body, ans associated avulsed appearance of both L3 pedicles (series 7, image 30) along the course of the bilateral L3 pedicle screws. Mild retropulsion of the posterior body fragment does not result in significant spinal stenosis. 2. No other acute osseous abnormality identified in the Lumbar Spine. But scant arthrodesis limited to only the right L5-S1 posterior elements following L3 through S1 fusion. Some endplate subsidence at each level. 3.  Aortic Atherosclerosis (ICD10-I70.0).  Nephrolithiasis.  COGNITION: Overall cognitive status: Within functional limits for tasks assessed   SENSATION: Impaired sensation in arms and legs - numbness and tingling  COORDINATION: WFL's - slowed movement due to back pain  POSTURE: rounded shoulders and forward head  LOWER EXTREMITY ROM:   WFL's bil.  LE's   LOWER EXTREMITY MMT:  grossly 4/5 throughout but pain with resistance due to back pain (surgical site)  BED MOBILITY:  Independent   TRANSFERS: Assistive device utilized: Environmental consultant - 4 wheeled  Sit to stand: Modified independence Stand to sit: Modified independence  STAIRS:  TBA  GAIT: Gait pattern:  increased knee  flexion bil. LE's in stance  and step through pattern Distance walked: 55' Assistive device utilized: Environmental consultant - 2 wheeled Level of assistance: SBA Comments: pt amb. With bil. Knees flexed  FUNCTIONAL TESTS:  5 times sit to stand: 21.47 secs from chair without UE support with close CGA Timed up and go (TUG): 21.47 secs with RW 10 meter walk test: 21.56 = 1.52 ft/sec with RW Berg Balance Scale: to be assessed  - pt stood for 2" without UE support with SBA  PATIENT SURVEYS:  N/A - dx is MS  TODAY'S TREATMENT:   Self-Care/ Home Management Lengthy discussion on pt's recent hospitalization and falls. See subjective for details of hospital stay. Pt received instruction on safety procedures, including to use RW until she can practice more with the cane in PT, conserving energy, and not performing activities that could lead to falls (plugging in items low to the ground that her husband could do instead). Pt was educated that if she were to require a catheter again she could continue PT.  TherEx Standing at countertop, BUE for support, CGA as needed for safety Hip extension x10 repetitions, performed bilaterally Knee flexion x10 repetitions, performed bilaterally Heel raises x10 repetitions Hip abduction x10 repetitions, performed bilaterally  TherAct Cross body reach at counter for cone + lateral step out and together x10 cones moved to the R, then x10 cones moved to the L Cross body reach at countertop for cone to place in upper cabinet + lateral step out and together, x10 cones, then x10 cones moved from upper cabinet to countertop When moving cones from upper cabinet to countertop, at around cone #5 pt forgets what task she is performing and begins moving the cones in the opposite direction. When prompted for what she was doing incorrectly in the task, pt is unable to determine on own. Cued to return to bringing cones from upper cabinet to countertop w/o other instances of forgetfulness  "It  feels good to be doing something" at end of session pt reports feeling good  PATIENT EDUCATION:  Education details: HEP additions Person educated: Patient Education method: Explanation, Demonstration, Tactile cues, Verbal cues, and Handouts Education comprehension: verbalized understanding, returned demonstration, verbal cues required, tactile cues required, and needs further education  HOME EXERCISE PROGRAM:  Access Code: [email or web address] (combined into one HEP 12/16/22) URL: [email or web address] Date: 12/16/2022 Prepared by: Peter Congo  Exercises - Ankle Dorsiflexion with Resistance  - 1 x daily - 7 x weekly - 3 sets - 10 reps - Gastroc Stretch on Wall  - 1 x daily - 7 x weekly - 3 sets - 10 reps - Supine Knee Extension Stretch on Towel Roll  - 1 x daily - 7 x weekly - Supine Quad Set  - 1 x daily - 7 x weekly - 3 sets - 10 reps - Sit to Stand with Arms Crossed  - 1 x daily - 7 x weekly - 3 sets - 10 reps - Supine Bridge  - 1 x daily - 7 x weekly - 3 sets - 10 reps - Side Stepping with Resistance at Ankles and Counter Support  - 1 x daily - 7  x weekly - 3 sets - 10 reps - Supine Hamstring Stretch  - 1 x daily - 7 x weekly - 1 sets - 2-3 reps - 60 sec hold - Single Leg Stance with Support  - 1 x daily - 7 x weekly - 1 sets - 2-3 reps - 10 sec hold - Alternating Step Forward with Support  - 1 x daily - 7 x weekly - 1 sets - 10 reps - Step Sideways  - 1 x daily - 7 x weekly - 1 sets - 10 reps - Alternating Step Backward with Support  - 1 x daily - 7 x weekly - 1 sets - 10 reps - Side Stepping with Counter Support  - 1 x daily - 7 x weekly - 1 sets - 2-3 reps - Standing March with Counter Support  - 1 x daily - 7 x weekly - 1 sets - 10 reps - Standing Hip Extension with Counter Support  - 1 x daily - 7 x weekly - 3 sets - 10 reps - Standing Knee Flexion with Counter Support  - 1 x daily - 7 x weekly - 3 sets - 10 reps - Heel Raises with Counter Support  - 1 x daily - 7 x weekly  - 3 sets - 10 reps - Standing Hip Abduction with Counter Support  - 1 x daily - 7 x weekly - 3 sets - 10 reps   GOALS: Goals reviewed with patient? Yes  NEW SHORT TERM GOALS: Target date:  12-16-22  Improve Berg score to >/= 45/56 to reduce fall risk.  Baseline: 30/56 (8/19), 34/56 (9/11);  score 41/56 on 11-21-22, 45/56 (12/16/22) Goal status: MET  2.  Pt will ambulate 46' with RW with SBA on flat, even surface for increased community accessibility. Baseline: 230 ft CGA no AD (9/11) Goal status: Met   3.  Report decreased back pain to </= 3/10 intensity for increased ease & comfort with ADL's & mobility.  Baseline: 5/10, 6/10 (9/11);  8-9/10 intensity reported on 12-13-22 Goal status: Not met  4.  Improve 5x sit to stand score to </= 16 secs from chair without UE support to demo improved LE strength.  Baseline: 21.47 secs, 15.62 sec no UE (9/11);  19.68 secs on 11-21-22;   16.07 secs Goal status:  Partially met  12-13-22  5.  Improve TUG score to </= 17.5 secs with RW to demo improved functional mobility.  Baseline: 21.47 secs with RW, 22 sec with RW (9/11); 21.12 secs on 11-21-22; 12-13-22:   21.82 secs with RW, 20.56 secs with no device, 19.81 with SPC Goal status: Not met - Ongoing  12-13-22  6.  Increase gait speed to >/= 1.9 ft/sec with RW with pt demonstrating bil. Knee extension in stance.  Baseline: 1.52 ft/sec with RW (21.56 secs), 1.52 ft/sec with RW (9/11); 1.67 ft/sec with RW (10-14);  12-13-22:  21.73 with RW, 20.88 secs without RW = 1.57 ft/sec Goal status: Partially met 12-13-22   UPDATED LONG TERM GOALS: Target date: 01-13-23  Improve Berg score  >/= 48/56 to reduce fall risk & demo improved balance. Baseline: 30/56 (8/19); score 41/56 on 11-21-22, 45/56 (12/16/22) Goal status: Goal met 11-21-22; UPGRADED/REVISED  2.  Pt will be modified independent with household ambulation without device. Baseline: pt reports she is walking short distances in home without use of RW,  but still using RW for longer distances in the home Goal status: Partially met - 11-21-22; ONGOING  3.  Improve 5x sit to stand score to </= 14 secs from chair without UE support to demo improved LE strength.  Baseline: 21.47 secs;  19.68 secs Goal status: Goal not met 11-21-22  4.  Improve TUG score to </= 14.5 secs with RW to demo improved functional mobility.  Baseline: 21.47 secs with RW;  21.12 secs:   20.41 secs,  18.81 secs without RW Goal status: Not met 11-21-22; ONGOING  5.   Increase gait speed to >/= 2.3 ft/sec with RW with pt demonstrating bil. Knee extension in stance.  Baseline: 1.52 ft/sec with RW (21.56 secs); 19.62,  20.00 = 1.67 ft/sec with RW Goal status: Goal not met 11-21-22; ONGOING  6.  Independent in HEP for balance and LE strengthening and core stabilization exercises. Baseline:  Goal status: Goal met 11-21-22  7.  Pt will transfer floor to stand with UE support on mat table with SBA.  Baseline:  TBA  Goal status:  NEW  ASSESSMENT:  CLINICAL IMPRESSION:  Emphasis of skilled PT session on discussing recent falls and hospital stay, standing BLE strengthening, and balance activities. Pt frustrated but understanding she should use her RW for now, as well as receptive to education on energy conservation. Pt tolerated standing strengthening exercises well, added to HEP. Pt does well w/ balance portion of cone reaching activity at counter, however, observed increased difficulty with coordination of steps as well as forgetfulness requiring cues to correct. Continue POC.   OBJECTIVE IMPAIRMENTS: Abnormal gait, decreased activity tolerance, decreased balance, decreased strength, and pain.   ACTIVITY LIMITATIONS: carrying, lifting, bending, squatting, stairs, transfers, reach over head, and locomotion level  PARTICIPATION LIMITATIONS: meal prep, cleaning, laundry, shopping, community activity, and pt reports she does not drive  PERSONAL FACTORS: Past/current  experiences, Time since onset of injury/illness/exacerbation, and 1 comorbidity: MS and s/p back surgery  are also affecting patient's functional outcome.   REHAB POTENTIAL: Good  CLINICAL DECISION MAKING: Evolving/moderate complexity  EVALUATION COMPLEXITY: Moderate  PLAN:  PT FREQUENCY: 2x/week  PT DURATION: 8 weeks  PLANNED INTERVENTIONS: Therapeutic exercises, Therapeutic activity, Neuromuscular re-education, Balance training, Gait training, Patient/Family education, Self Care, Stair training, DME instructions, and Aquatic Therapy  PLAN FOR NEXT SESSION: cont gait training with SPC with rubber quad base, balance training, floor transfer practice (mindful of wounds from fall), single leg stance, stance progressions, LE strengthening, endurance, balance on compliant surfaces, hip abduction and flexion strengthening, countertop balance    Beverely Low, SPT  [facility] [address], [address] [place], [place] [address] [phone]   01/04/2023, 4:18 PM

## 2023-01-09 ENCOUNTER — Telehealth: Payer: Self-pay | Admitting: Neurology

## 2023-01-09 NOTE — Telephone Encounter (Signed)
Agree PCP follow up for fall related complications

## 2023-01-09 NOTE — Telephone Encounter (Signed)
Pt called, the message was relayed that she needs to address her falls with PCP, pt stated ok, this is FYI to POD 2

## 2023-01-09 NOTE — Telephone Encounter (Signed)
lmtrc

## 2023-01-09 NOTE — Telephone Encounter (Signed)
Pt returned phone call, would like a call back.  

## 2023-01-09 NOTE — Telephone Encounter (Signed)
Call to patient, advised to follow up with PCP for wound care following ER visit 11/20 from fall with head strike and skin tears.  Doesn't appear infected but bleeds when changing the bandage.   Patient was treated for UTI and has finished all antibiotics but still weak reporting she feels like she will fall a lot. MRI in ER did not show any new lesions or MS exacerbation. Patient concerned something is going on as her previous fall resulted in back surgery. Advised I will send to Sarah and Dr. Terrace Arabia for review.

## 2023-01-09 NOTE — Telephone Encounter (Signed)
Pt called stating that lately she has had a lot of falls and the last one sent her to the ER. Now every time she changes the dressing she starts bleeding again and she would like to discuss with RN or MD if maybe it is due to the medications. Please advise.

## 2023-01-10 ENCOUNTER — Ambulatory Visit: Payer: PPO | Attending: Physician Assistant | Admitting: Physical Therapy

## 2023-01-10 DIAGNOSIS — G35 Multiple sclerosis: Secondary | ICD-10-CM | POA: Insufficient documentation

## 2023-01-10 DIAGNOSIS — R269 Unspecified abnormalities of gait and mobility: Secondary | ICD-10-CM | POA: Insufficient documentation

## 2023-01-10 DIAGNOSIS — H8111 Benign paroxysmal vertigo, right ear: Secondary | ICD-10-CM | POA: Insufficient documentation

## 2023-01-10 DIAGNOSIS — R29818 Other symptoms and signs involving the nervous system: Secondary | ICD-10-CM | POA: Diagnosis not present

## 2023-01-10 DIAGNOSIS — R2681 Unsteadiness on feet: Secondary | ICD-10-CM | POA: Insufficient documentation

## 2023-01-10 DIAGNOSIS — R262 Difficulty in walking, not elsewhere classified: Secondary | ICD-10-CM | POA: Diagnosis not present

## 2023-01-10 DIAGNOSIS — R2689 Other abnormalities of gait and mobility: Secondary | ICD-10-CM | POA: Diagnosis not present

## 2023-01-10 DIAGNOSIS — M6281 Muscle weakness (generalized): Secondary | ICD-10-CM | POA: Diagnosis not present

## 2023-01-10 NOTE — Therapy (Unsigned)
OUTPATIENT PHYSICAL THERAPY NEURO TREATMENT NOTE    Patient Name: Katrina Rivera MRN: [id number] DOB:07/26/1945, 77 y.o., female Today's Date: 01/11/2023    PCP: Noberto Retort, MD REFERRING PROVIDER: Charlton Amor, PA-C  END OF SESSION:  PT End of Session - 01/11/23 1744     Visit Number 21    Number of Visits 27    Date for PT Re-Evaluation 01/13/23    Authorization Type [employer]    Authorization Time Period 09-20-22 - 12-08-22; 11-21-22 - 02-07-23    Progress Note Due on Visit 20    PT Start Time 1018    PT Stop Time 1100    PT Time Calculation (min) 42 min    Equipment Utilized During Treatment Gait belt    Activity Tolerance Patient tolerated treatment well    Behavior During Therapy WFL for tasks assessed/performed                       Past Medical History:  Diagnosis Date   Addison's disease (HCC)    takes Solu Cortef daily   Anemia    takes Ferrous Sulfate daily   Anxiety    takes Xanax nightly   Arthritis    Chronic back pain    stenosis   Depression    takes Cymbalta daily   Dizziness    if b/p drops    Fibromyalgia    History of blood transfusion    no abnormal reaction noted   History of bronchitis    many yrs ago    Hypokalemia    takes Potassium daily   Hypotension    takes Florinef daily   IBS (irritable bowel syndrome)    takes Align daily   Insomnia    takes Trazodone nightly   Joint pain    Multiple sclerosis (HCC)    doesn't take any meds   Multiple sclerosis (HCC)    New onset headache 12/22/2021   Nocturia    Osteoporosis    Palpitations    Peripheral neuropathy    Primary localized osteoarthritis of left knee 08/11/2020   Restless leg syndrome    Seasonal allergies    takes Zyrtec daily;uses Flonase daily as needed   Syncope    when missed methylprednisolone dose   Urinary frequency    takes Flomax daily   Weakness    numbness and tingling   Past Surgical History:  Procedure Laterality  Date   ABDOMINAL HYSTERECTOMY  02/07/1986   APPENDECTOMY  02/07/1986   CESAREAN SECTION  1973/1977   x2   CHOLECYSTECTOMY  02/08/1995   COLECTOMY  02/08/1988   COLONOSCOPY     ESOPHAGOGASTRODUODENOSCOPY     EYE SURGERY     bilateral - /w IOL- cataracts   FRACTURE SURGERY Right    rods and screws-right leg   LUMBAR LAMINECTOMY/DECOMPRESSION MICRODISCECTOMY Left 01/24/2013   Procedure: LUMBAR FIVE TO SACRAL ONE LUMBAR LAMINECTOMY/DECOMPRESSION MICRODISCECTOMY 1 LEVEL;  Surgeon: Tia Alert, MD;  Location: [facility];  Service: Neurosurgery;  Laterality: Left;   MAXIMUM ACCESS (MAS)POSTERIOR LUMBAR INTERBODY FUSION (PLIF) 1 LEVEL N/A 06/18/2014   Procedure: MAXIMUM ACCESS SURGERY POSTERIOR LUMBAR INTERBODY FUSION LUMBAR FIVE TO SACRAL ONE ;  Surgeon: Tia Alert, MD;  Location: [facility];  Service: Neurosurgery;  Laterality: N/A;   SPINAL CORD STIMULATOR BATTERY EXCHANGE Right 07/30/2021   Procedure: Spinal cord stimulator battery replacement;  Surgeon: Tia Alert, MD;  Location: [facility] OR;  Service:  Neurosurgery;  Laterality: Right;   SPINAL CORD STIMULATOR IMPLANT     TOTAL KNEE ARTHROPLASTY Left 08/24/2020   Procedure: TOTAL KNEE ARTHROPLASTY;  Surgeon: Salvatore Marvel, MD;  Location: [facility];  Service: Orthopedics;  Laterality: Left;   Patient Active Problem List   Diagnosis Date Noted   Myofascial pain dysfunction syndrome 11/16/2022   Vertigo 11/16/2022   Lumbar burst fracture (HCC) 08/23/2022   Lumbar vertebral fracture (HCC) 08/19/2022   Chronic migraine w/o aura w/o status migrainosus, not intractable 03/29/2022   Mixed hyperlipidemia 12/27/2021   Chest pain of uncertain etiology 12/27/2021   New onset headache 12/22/2021   S/P lumbar fusion 07/30/2021   Primary localized osteoarthritis of left knee 08/11/2020   Atrophic vaginitis 08/11/2020   Menopausal symptom 08/11/2020   Restless leg syndrome    Chronic back pain    Low back pain without sciatica 11/01/2018    Essential hypertension    SBO (small bowel obstruction) (HCC) 09/01/2016   Fibromyalgia    Abdominal pain, vomiting, and diarrhea    Dehydration    Somnolence, daytime 10/22/2015   Fatigue 10/22/2015   Chronic leg pain 06/29/2015   S/P lumbar spinal fusion 06/18/2014   Abnormality of gait 02/18/2013   S/P lumbar microdiscectomy 01/24/2013   Hypokalemia 12/24/2012   Sweating abnormality 10/19/2012   Dysautonomia orthostatic hypotension syndrome 08/16/2012   Hereditary and idiopathic peripheral neuropathy 08/16/2012   Chronic adrenal insufficiency (HCC) 08/16/2012   MS (multiple sclerosis) (HCC) 08/15/2012    ONSET DATE: 08-17-22  REFERRING DIAG: G35 (ICD-10-CM) - Multiple sclerosis   Diagnosis  S32.039D (ICD-10-CM) - Closed fracture of third lumbar vertebra with routine healing, unspecified fracture morphology, subsequent encounter    THERAPY DIAG:  Other abnormalities of gait and mobility  Unsteadiness on feet  Rationale for Evaluation and Treatment: Rehabilitation  SUBJECTIVE:                                                                                                                                                                                             SUBJECTIVE STATEMENT:  Pt reports she contacted [facility] for appt with her neurologist due to recent numerous falls but they told her she needed to follow up with her PCP regarding this;            Pt reports she doesn't understand why she is falling so much lately; pt using RW for assistance with ambulation   Pt accompanied by: self  PERTINENT HISTORY: spinal cord stimulator implant; MS diagnosed in 1984; s/p 2 back surgeries  Per Chart note "Brief HPI:   Katrina Rivera is a 77 y.o.  female with a history of relapsing remitting multiple sclerosis who was evaluated by Dr. Yetta Barre regarding gait abnormality, chronic low back pain and status post lumbar fusion May 2024. She has reportedly fallen 4 times at home after PLIF of  L3-5. She was admitted 08/17/2022 for scheduled surgery of L3 spinal fracture. She underwent posterior fixation L2-L4 using NuVasive cortical pedicle screws, arthrodesis and removal of segmental fixation L3-L5 by Dr. Yetta Barre. PT OT evaluations carried out. Incision is healed without signs of infection. She is appropriate back soreness. She has no complaints of leg pain or new numbness, tingling or weakness per Dr. Barnett Applebaum note today. Signs are stable and she is tolerating regular diet. Other past medical history includes restless leg syndrome, anxiety, headaches with migraine features, fibromyalgia, hypotension, anemia (acute on chronic)."    PAIN:  Are you having pain? Yes: NPRS scale: 6/10 Pain location: low back pain into legs)  Pain description: tightness, throbbing, sore Aggravating factors: no specific Relieving factors: rest (TLSO has been discontinued by MD)   PRECAUTIONS: Back  - No bending, lifting >10#, twisting, reaching overhead; Dr Yetta Barre responded with "regular back precautions" via inbasket message inquiring about precautions  RED FLAGS: None   WEIGHT BEARING RESTRICTIONS: No  FALLS: Has patient fallen in last 6 months? Yes. Number of falls 4  LIVING ENVIRONMENT: Lives with: lives with their spouse Lives in: House/apartment Stairs: Yes: External: 1 steps; planning on having rail installed Has following equipment at home: Walker - 2 wheeled, shower chair, and Grab bars  PLOF: Independent with household mobility with device, Requires assistive device for independence, and Needs assistance with ADLs  PATIENT GOALS: "walk without walker and back brace and get back to where I was before"  OBJECTIVE:   DIAGNOSTIC FINDINGS: IMPRESSION: 1. Unhealed fracture of the posterosuperior L3 body, ans associated avulsed appearance of both L3 pedicles (series 7, image 30) along the course of the bilateral L3 pedicle screws. Mild retropulsion of the posterior body fragment does not result  in significant spinal stenosis. 2. No other acute osseous abnormality identified in the Lumbar Spine. But scant arthrodesis limited to only the right L5-S1 posterior elements following L3 through S1 fusion. Some endplate subsidence at each level. 3.  Aortic Atherosclerosis (ICD10-I70.0).  Nephrolithiasis.  COGNITION: Overall cognitive status: Within functional limits for tasks assessed   SENSATION: Impaired sensation in arms and legs - numbness and tingling  COORDINATION: WFL's - slowed movement due to back pain  POSTURE: rounded shoulders and forward head  LOWER EXTREMITY ROM:   WFL's bil. LE's   LOWER EXTREMITY MMT:  grossly 4/5 throughout but pain with resistance due to back pain (surgical site)  BED MOBILITY:  Independent   TRANSFERS: Assistive device utilized: Environmental consultant - 4 wheeled  Sit to stand: Modified independence Stand to sit: Modified independence  STAIRS:  TBA  GAIT: Gait pattern:  increased knee flexion bil. LE's in stance  and step through pattern Distance walked: 2' Assistive device utilized: Environmental consultant - 2 wheeled Level of assistance: SBA Comments: pt amb. With bil. Knees flexed  FUNCTIONAL TESTS:  5 times sit to stand: 21.47 secs from chair without UE support with close CGA Timed up and go (TUG): 21.47 secs with RW 10 meter walk test: 21.56 = 1.52 ft/sec with RW Berg Balance Scale: to be assessed  - pt stood for 2" without UE support with SBA  PATIENT SURVEYS:  N/A - dx is MS  TODAY'S TREATMENT:    NeuroRe-ed:    01/10/23 0001  Berg Balance Test  Sit to Stand 4  Standing Unsupported 3  Sitting with Back Unsupported but Feet Supported on Floor or Stool 4  Stand to Sit 4  Transfers 4  Standing Unsupported with Eyes Closed 3  Standing Unsupported with Feet Together 3  From Standing, Reach Forward with Outstretched Arm 4  From Standing Position, Pick up Object from Floor 3  From Standing Position, Turn to Look Behind Over each Shoulder 4  Turn  360 Degrees 2 (L= 6.85  R= 6.04)  Standing Unsupported, Alternately Place Feet on Step/Stool 1  Standing Unsupported, One Foot in Front 3  Standing on One Leg 1  Total Score 43   5x sit to stand score 24.10 secs - from chair without UE support - pt lost balance posteriorly on 3rd & 4th rep   TUG score = 26.19 secs with RW  GAIT: Gait pattern: increased knee flexion bil. LE's in stance  and step through pattern Distance walked: 115' Assistive device utilized: None Level of assistance: CGA Comments: Lt knee flexed due to knee flexion contracture  PATIENT EDUCATION:  Education details: discussed progress towards LTG's with scores compared to previous score when STG's assesed. Person educated: Patient Education method: Explanation, Demonstration, Tactile cues, Verbal cues, and Handouts Education comprehension: verbalized understanding, returned demonstration, verbal cues required, tactile cues required, and needs further education  HOME EXERCISE PROGRAM:  Access Code: [email or web address] (combined into one HEP 12/16/22) URL: [email or web address] Date: 12/16/2022 Prepared by: Peter Congo  Exercises - Ankle Dorsiflexion with Resistance  - 1 x daily - 7 x weekly - 3 sets - 10 reps - Gastroc Stretch on Wall  - 1 x daily - 7 x weekly - 3 sets - 10 reps - Supine Knee Extension Stretch on Towel Roll  - 1 x daily - 7 x weekly - Supine Quad Set  - 1 x daily - 7 x weekly - 3 sets - 10 reps - Sit to Stand with Arms Crossed  - 1 x daily - 7 x weekly - 3 sets - 10 reps - Supine Bridge  - 1 x daily - 7 x weekly - 3 sets - 10 reps - Side Stepping with Resistance at Ankles and Counter Support  - 1 x daily - 7 x weekly - 3 sets - 10 reps - Supine Hamstring Stretch  - 1 x daily - 7 x weekly - 1 sets - 2-3 reps - 60 sec hold - Single Leg Stance with Support  - 1 x daily - 7 x weekly - 1 sets - 2-3 reps - 10 sec hold - Alternating Step Forward with Support  - 1 x daily - 7 x weekly - 1 sets -  10 reps - Step Sideways  - 1 x daily - 7 x weekly - 1 sets - 10 reps - Alternating Step Backward with Support  - 1 x daily - 7 x weekly - 1 sets - 10 reps - Side Stepping with Counter Support  - 1 x daily - 7 x weekly - 1 sets - 2-3 reps - Standing March with Counter Support  - 1 x daily - 7 x weekly - 1 sets - 10 reps - Standing Hip Extension with Counter Support  - 1 x daily - 7 x weekly - 3 sets - 10 reps - Standing Knee Flexion with Counter Support  - 1 x daily - 7 x weekly - 3 sets - 10 reps - Heel Raises with Counter Support  -  1 x daily - 7 x weekly - 3 sets - 10 reps - Standing Hip Abduction with Counter Support  - 1 x daily - 7 x weekly - 3 sets - 10 reps   GOALS: Goals reviewed with patient? Yes  NEW SHORT TERM GOALS: Target date:  12-16-22  Improve Berg score to >/= 45/56 to reduce fall risk.  Baseline: 30/56 (8/19), 34/56 (9/11);  score 41/56 on 11-21-22, 45/56 (12/16/22) Goal status: MET  2.  Pt will ambulate 91' with RW with SBA on flat, even surface for increased community accessibility. Baseline: 230 ft CGA no AD (9/11) Goal status: Met   3.  Report decreased back pain to </= 3/10 intensity for increased ease & comfort with ADL's & mobility.  Baseline: 5/10, 6/10 (9/11);  8-9/10 intensity reported on 12-13-22 Goal status: Not met  4.  Improve 5x sit to stand score to </= 16 secs from chair without UE support to demo improved LE strength.  Baseline: 21.47 secs, 15.62 sec no UE (9/11);  19.68 secs on 11-21-22;   16.07 secs Goal status:  Partially met  12-13-22  5.  Improve TUG score to </= 17.5 secs with RW to demo improved functional mobility.  Baseline: 21.47 secs with RW, 22 sec with RW (9/11); 21.12 secs on 11-21-22; 12-13-22:   21.82 secs with RW, 20.56 secs with no device, 19.81 with SPC Goal status: Not met - Ongoing  12-13-22  6.  Increase gait speed to >/= 1.9 ft/sec with RW with pt demonstrating bil. Knee extension in stance.  Baseline: 1.52 ft/sec with RW  (21.56 secs), 1.52 ft/sec with RW (9/11); 1.67 ft/sec with RW (10-14);  12-13-22:  21.73 with RW, 20.88 secs without RW = 1.57 ft/sec Goal status: Partially met 12-13-22   UPDATED LONG TERM GOALS: Target date: 01-13-23  Improve Berg score  >/= 48/56 to reduce fall risk & demo improved balance. Baseline: 30/56 (8/19); score 41/56 on 11-21-22, 45/56 (12/16/22); score 43/56 on 01-10-23 Goal status: Not met 01-10-23  2.  Pt will be modified independent with household ambulation without device. Baseline: pt reports she is walking short distances in home without use of RW, but still using RW for longer distances in the home Goal status: Partially met - 11-21-22; ONGOING 01-10-23  3.  Improve 5x sit to stand score to </= 14 secs from chair without UE support to demo improved LE strength.  Baseline: 21.47 secs;  19.68 secs:   01-10-23   24.10 secs Goal status: Goal not met 11-21-22;  NOT MET 01-10-23  4.  Improve TUG score to </= 14.5 secs with RW to demo improved functional mobility.  Baseline: 21.47 secs with RW;  21.12 secs:   20.41 secs,  18.81 secs without RW;   26.19 secs with RW (01-10-23) Goal status: Not met 11-21-22; ONGOING  5.   Increase gait speed to >/= 2.3 ft/sec with RW with pt demonstrating bil. Knee extension in stance.  Baseline: 1.52 ft/sec with RW (21.56 secs); 19.62,  20.00 = 1.67 ft/sec with RW Goal status: Goal not met 11-21-22; ONGOING  6.  Independent in HEP for balance and LE strengthening and core stabilization exercises. Baseline:  Goal status: Goal met 11-21-22  7.  Pt will transfer floor to stand with UE support on mat table with SBA.  Baseline:  TBA  Goal status:  NEW  ASSESSMENT:  CLINICAL IMPRESSION:  PT session focused on assessment of LTG's with plan for renewal; pt has sustained several falls  within past month (UTI possibly contributing to falls).  Pt has not met LTG's #1-4 as assessed in today's session.  Berg score has decreased from 45/56 on 12-16-22 to 43/56  in today's session.  TUG score has decreased from 20.41 secs to 26.19 secs with RW in today's session.  Pt's 5x sit to stand score has decreased from 19.68 secs to 24.10 secs without UE support from chair.  Pt is now using RW for assistance with household and community ambulation; she had previously progressed to use of [facility] for household ambulation.  Plan to renew for 4 weeks due to pt's regression in progress due to medical issues. Continue POC.   OBJECTIVE IMPAIRMENTS: Abnormal gait, decreased activity tolerance, decreased balance, decreased strength, and pain.   ACTIVITY LIMITATIONS: carrying, lifting, bending, squatting, stairs, transfers, reach over head, and locomotion level  PARTICIPATION LIMITATIONS: meal prep, cleaning, laundry, shopping, community activity, and pt reports she does not drive  PERSONAL FACTORS: Past/current experiences, Time since onset of injury/illness/exacerbation, and 1 comorbidity: MS and s/p back surgery  are also affecting patient's functional outcome.   REHAB POTENTIAL: Good  CLINICAL DECISION MAKING: Evolving/moderate complexity  EVALUATION COMPLEXITY: Moderate  PLAN:  PT FREQUENCY: 2x/week  PT DURATION: 8 weeks  PLANNED INTERVENTIONS: Therapeutic exercises, Therapeutic activity, Neuromuscular re-education, Balance training, Gait training, Patient/Family education, Self Care, Stair training, DME instructions, and Aquatic Therapy  PLAN FOR NEXT SESSION: Check remaining LTG's - plan to renew  Cont gait training with SPC with rubber quad base, balance training, floor transfer practice, single leg stance, stance progressions, LE strengthening, endurance, balance on compliant surfaces, hip abduction and flexion strengthening, countertop balance     Kerry Fort, PT [facility] [address] [place], [place] [address] [phone]   01/11/2023, 5:48 PM

## 2023-01-11 ENCOUNTER — Encounter: Payer: Self-pay | Admitting: Physical Therapy

## 2023-01-11 NOTE — Progress Notes (Signed)
   01/10/23 0001  Berg Balance Test  Sit to Stand 4  Standing Unsupported 3  Sitting with Back Unsupported but Feet Supported on Floor or Stool 4  Stand to Sit 4  Transfers 4  Standing Unsupported with Eyes Closed 3  Standing Unsupported with Feet Together 3  From Standing, Reach Forward with Outstretched Arm 4  From Standing Position, Pick up Object from Floor 3  From Standing Position, Turn to Look Behind Over each Shoulder 4  Turn 360 Degrees 2 (L= 6.85  R= 6.04)  Standing Unsupported, Alternately Place Feet on Step/Stool 1  Standing Unsupported, One Foot in Front 3  Standing on One Leg 1  Total Score 43

## 2023-01-12 ENCOUNTER — Ambulatory Visit: Payer: PPO | Admitting: Physical Therapy

## 2023-01-12 DIAGNOSIS — M461 Sacroiliitis, not elsewhere classified: Secondary | ICD-10-CM | POA: Diagnosis not present

## 2023-01-12 DIAGNOSIS — R2689 Other abnormalities of gait and mobility: Secondary | ICD-10-CM | POA: Diagnosis not present

## 2023-01-12 DIAGNOSIS — R2681 Unsteadiness on feet: Secondary | ICD-10-CM

## 2023-01-12 DIAGNOSIS — M5417 Radiculopathy, lumbosacral region: Secondary | ICD-10-CM | POA: Diagnosis not present

## 2023-01-12 DIAGNOSIS — M6281 Muscle weakness (generalized): Secondary | ICD-10-CM

## 2023-01-12 NOTE — Therapy (Signed)
OUTPATIENT PHYSICAL THERAPY NEURO TREATMENT NOTE/RE-CERT    Patient Name: Katrina Rivera MRN: [id number] DOB:21-Jan-1946, 77 y.o., female Today's Date: 01/13/2023    PCP: Noberto Retort, MD REFERRING PROVIDER: Charlton Amor, PA-C  END OF SESSION:  PT End of Session - 01/13/23 1330     Visit Number 22    Number of Visits 30    Date for PT Re-Evaluation 02/10/23    Authorization Type [employer]    Authorization Time Period 09-20-22 - 12-08-22; 11-21-22 - 02-07-23;  01-12-23 - 03-10-23    Progress Note Due on Visit 30    PT Start Time 1017    PT Stop Time 1100    PT Time Calculation (min) 43 min    Equipment Utilized During Treatment Gait belt    Activity Tolerance Patient tolerated treatment well    Behavior During Therapy WFL for tasks assessed/performed                        Past Medical History:  Diagnosis Date   Addison's disease (HCC)    takes Solu Cortef daily   Anemia    takes Ferrous Sulfate daily   Anxiety    takes Xanax nightly   Arthritis    Chronic back pain    stenosis   Depression    takes Cymbalta daily   Dizziness    if b/p drops    Fibromyalgia    History of blood transfusion    no abnormal reaction noted   History of bronchitis    many yrs ago    Hypokalemia    takes Potassium daily   Hypotension    takes Florinef daily   IBS (irritable bowel syndrome)    takes Align daily   Insomnia    takes Trazodone nightly   Joint pain    Multiple sclerosis (HCC)    doesn't take any meds   Multiple sclerosis (HCC)    New onset headache 12/22/2021   Nocturia    Osteoporosis    Palpitations    Peripheral neuropathy    Primary localized osteoarthritis of left knee 08/11/2020   Restless leg syndrome    Seasonal allergies    takes Zyrtec daily;uses Flonase daily as needed   Syncope    when missed methylprednisolone dose   Urinary frequency    takes Flomax daily   Weakness    numbness and tingling   Past Surgical  History:  Procedure Laterality Date   ABDOMINAL HYSTERECTOMY  02/07/1986   APPENDECTOMY  02/07/1986   CESAREAN SECTION  1973/1977   x2   CHOLECYSTECTOMY  02/08/1995   COLECTOMY  02/08/1988   COLONOSCOPY     ESOPHAGOGASTRODUODENOSCOPY     EYE SURGERY     bilateral - /w IOL- cataracts   FRACTURE SURGERY Right    rods and screws-right leg   LUMBAR LAMINECTOMY/DECOMPRESSION MICRODISCECTOMY Left 01/24/2013   Procedure: LUMBAR FIVE TO SACRAL ONE LUMBAR LAMINECTOMY/DECOMPRESSION MICRODISCECTOMY 1 LEVEL;  Surgeon: Tia Alert, MD;  Location: [facility];  Service: Neurosurgery;  Laterality: Left;   MAXIMUM ACCESS (MAS)POSTERIOR LUMBAR INTERBODY FUSION (PLIF) 1 LEVEL N/A 06/18/2014   Procedure: MAXIMUM ACCESS SURGERY POSTERIOR LUMBAR INTERBODY FUSION LUMBAR FIVE TO SACRAL ONE ;  Surgeon: Tia Alert, MD;  Location: [facility];  Service: Neurosurgery;  Laterality: N/A;   SPINAL CORD STIMULATOR BATTERY EXCHANGE Right 07/30/2021   Procedure: Spinal cord stimulator battery replacement;  Surgeon: Tia Alert, MD;  Location: [facility];  Service: Neurosurgery;  Laterality: Right;   SPINAL CORD STIMULATOR IMPLANT     TOTAL KNEE ARTHROPLASTY Left 08/24/2020   Procedure: TOTAL KNEE ARTHROPLASTY;  Surgeon: Salvatore Marvel, MD;  Location: [facility];  Service: Orthopedics;  Laterality: Left;   Patient Active Problem List   Diagnosis Date Noted   Myofascial pain dysfunction syndrome 11/16/2022   Vertigo 11/16/2022   Lumbar burst fracture (HCC) 08/23/2022   Lumbar vertebral fracture (HCC) 08/19/2022   Chronic migraine w/o aura w/o status migrainosus, not intractable 03/29/2022   Mixed hyperlipidemia 12/27/2021   Chest pain of uncertain etiology 12/27/2021   New onset headache 12/22/2021   S/P lumbar fusion 07/30/2021   Primary localized osteoarthritis of left knee 08/11/2020   Atrophic vaginitis 08/11/2020   Menopausal symptom 08/11/2020   Restless leg syndrome    Chronic back pain    Low back pain  without sciatica 11/01/2018   Essential hypertension    SBO (small bowel obstruction) (HCC) 09/01/2016   Fibromyalgia    Abdominal pain, vomiting, and diarrhea    Dehydration    Somnolence, daytime 10/22/2015   Fatigue 10/22/2015   Chronic leg pain 06/29/2015   S/P lumbar spinal fusion 06/18/2014   Abnormality of gait 02/18/2013   S/P lumbar microdiscectomy 01/24/2013   Hypokalemia 12/24/2012   Sweating abnormality 10/19/2012   Dysautonomia orthostatic hypotension syndrome 08/16/2012   Hereditary and idiopathic peripheral neuropathy 08/16/2012   Chronic adrenal insufficiency (HCC) 08/16/2012   MS (multiple sclerosis) (HCC) 08/15/2012    ONSET DATE: 08-17-22  REFERRING DIAG: G35 (ICD-10-CM) - Multiple sclerosis   Diagnosis  S32.039D (ICD-10-CM) - Closed fracture of third lumbar vertebra with routine healing, unspecified fracture morphology, subsequent encounter    THERAPY DIAG:  Other abnormalities of gait and mobility  Unsteadiness on feet  Muscle weakness (generalized)  Rationale for Evaluation and Treatment: Rehabilitation  SUBJECTIVE:                                                                                                                                                                                             SUBJECTIVE STATEMENT:  Pt reports she feels better today than she did on Tuesday this week; pt states she hopes she can get back to using the cane like she was prior to the onset of her falls  Pt accompanied by: self  PERTINENT HISTORY: spinal cord stimulator implant; MS diagnosed in 1984; s/p 2 back surgeries  Per Chart note "Brief HPI:   Katrina Rivera is a 77 y.o. female with a history of relapsing remitting multiple sclerosis who was evaluated by Dr. Yetta Barre regarding  gait abnormality, chronic low back pain and status post lumbar fusion May 2024. She has reportedly fallen 4 times at home after PLIF of L3-5. She was admitted 08/17/2022 for scheduled  surgery of L3 spinal fracture. She underwent posterior fixation L2-L4 using NuVasive cortical pedicle screws, arthrodesis and removal of segmental fixation L3-L5 by Dr. Yetta Barre. PT OT evaluations carried out. Incision is healed without signs of infection. She is appropriate back soreness. She has no complaints of leg pain or new numbness, tingling or weakness per Dr. Barnett Applebaum note today. Signs are stable and she is tolerating regular diet. Other past medical history includes restless leg syndrome, anxiety, headaches with migraine features, fibromyalgia, hypotension, anemia (acute on chronic)."    PAIN:  Are you having pain? Yes: NPRS scale: 5/10 Pain location: low back pain into legs)  Pain description: tightness, throbbing, sore Aggravating factors: no specific Relieving factors: rest (TLSO has been discontinued by MD)   PRECAUTIONS: Back  - No bending, lifting >10#, twisting, reaching overhead; Dr Yetta Barre responded with "regular back precautions" via inbasket message inquiring about precautions  RED FLAGS: None   WEIGHT BEARING RESTRICTIONS: No  FALLS: Has patient fallen in last 6 months? Yes. Number of falls 4  LIVING ENVIRONMENT: Lives with: lives with their spouse Lives in: House/apartment Stairs: Yes: External: 1 steps; planning on having rail installed Has following equipment at home: Walker - 2 wheeled, shower chair, and Grab bars  PLOF: Independent with household mobility with device, Requires assistive device for independence, and Needs assistance with ADLs  PATIENT GOALS: "walk without walker and back brace and get back to where I was before"  OBJECTIVE:   DIAGNOSTIC FINDINGS: IMPRESSION: 1. Unhealed fracture of the posterosuperior L3 body, ans associated avulsed appearance of both L3 pedicles (series 7, image 30) along the course of the bilateral L3 pedicle screws. Mild retropulsion of the posterior body fragment does not result in significant spinal stenosis. 2. No other  acute osseous abnormality identified in the Lumbar Spine. But scant arthrodesis limited to only the right L5-S1 posterior elements following L3 through S1 fusion. Some endplate subsidence at each level. 3.  Aortic Atherosclerosis (ICD10-I70.0).  Nephrolithiasis.  COGNITION: Overall cognitive status: Within functional limits for tasks assessed   SENSATION: Impaired sensation in arms and legs - numbness and tingling  COORDINATION: WFL's - slowed movement due to back pain  POSTURE: rounded shoulders and forward head  LOWER EXTREMITY ROM:   WFL's bil. LE's   LOWER EXTREMITY MMT:  grossly 4/5 throughout but pain with resistance due to back pain (surgical site)  BED MOBILITY:  Independent   TRANSFERS: Assistive device utilized: Environmental consultant - 4 wheeled  Sit to stand: Modified independence Stand to sit: Modified independence  STAIRS:  TBA  GAIT: Gait pattern:  increased knee flexion bil. LE's in stance  and step through pattern Distance walked: 100' Assistive device utilized: Environmental consultant - 2 wheeled Level of assistance: SBA Comments: pt amb. With bil. Knees flexed  FUNCTIONAL TESTS:  5 times sit to stand: 21.47 secs from chair without UE support with close CGA Timed up and go (TUG): 21.47 secs with RW 10 meter walk test: 21.56 = 1.52 ft/sec with RW Berg Balance Scale: to be assessed  - pt stood for 2" without UE support with SBA  PATIENT SURVEYS:  N/A - dx is MS  TODAY'S TREATMENT:  01-12-23  NeuroRe-ed: TUG score 21.56 secs with RW, 19.19 secs without RW  Verbally reviewed balance exercises with pt with demonstration due to  pt's confusion with performing hip extension in standing; pt performed standing hip extension alternating 5 reps each leg with UE support on counter; marching in place 5 reps each leg with UE support prn    GAIT: Gait pattern: increased knee flexion bil. LE's in stance  and step through pattern Distance walked: 230'  Assistive device utilized: None Level  of assistance: CGA Comments: Lt knee flexed due to knee flexion contracture  Gait velocity:  23.65, 23.19 sec = 1.41 ft/sec with RW  Step training - 1 rail with step over step sequence for ascension and descension with CGA  TherEx: 5x sit to stand score 17.78 secs without UE support from chair  Step ups onto 1st step (6") 10 reps RLE and 10 reps LLE with UE support prn for assist with balance   PATIENT EDUCATION:  Education details: discussed progress towards LTG's with scores compared to previous score when STG's assesed. Person educated: Patient Education method: Explanation, Demonstration, Tactile cues, Verbal cues, and Handouts Education comprehension: verbalized understanding, returned demonstration, verbal cues required, tactile cues required, and needs further education  HOME EXERCISE PROGRAM:  Access Code: [email or web address] (combined into one HEP 12/16/22) URL: [email or web address] Date: 12/16/2022 Prepared by: Peter Congo  Exercises - Ankle Dorsiflexion with Resistance  - 1 x daily - 7 x weekly - 3 sets - 10 reps - Gastroc Stretch on Wall  - 1 x daily - 7 x weekly - 3 sets - 10 reps - Supine Knee Extension Stretch on Towel Roll  - 1 x daily - 7 x weekly - Supine Quad Set  - 1 x daily - 7 x weekly - 3 sets - 10 reps - Sit to Stand with Arms Crossed  - 1 x daily - 7 x weekly - 3 sets - 10 reps - Supine Bridge  - 1 x daily - 7 x weekly - 3 sets - 10 reps - Side Stepping with Resistance at Ankles and Counter Support  - 1 x daily - 7 x weekly - 3 sets - 10 reps - Supine Hamstring Stretch  - 1 x daily - 7 x weekly - 1 sets - 2-3 reps - 60 sec hold - Single Leg Stance with Support  - 1 x daily - 7 x weekly - 1 sets - 2-3 reps - 10 sec hold - Alternating Step Forward with Support  - 1 x daily - 7 x weekly - 1 sets - 10 reps - Step Sideways  - 1 x daily - 7 x weekly - 1 sets - 10 reps - Alternating Step Backward with Support  - 1 x daily - 7 x weekly - 1 sets - 10 reps -  Side Stepping with Counter Support  - 1 x daily - 7 x weekly - 1 sets - 2-3 reps - Standing March with Counter Support  - 1 x daily - 7 x weekly - 1 sets - 10 reps - Standing Hip Extension with Counter Support  - 1 x daily - 7 x weekly - 3 sets - 10 reps - Standing Knee Flexion with Counter Support  - 1 x daily - 7 x weekly - 3 sets - 10 reps - Heel Raises with Counter Support  - 1 x daily - 7 x weekly - 3 sets - 10 reps - Standing Hip Abduction with Counter Support  - 1 x daily - 7 x weekly - 3 sets - 10 reps   GOALS: Goals reviewed with patient? Yes  NEW  SHORT TERM GOALS: Target date:  12-16-22  Improve Berg score to >/= 45/56 to reduce fall risk.  Baseline: 30/56 (8/19), 34/56 (9/11);  score 41/56 on 11-21-22, 45/56 (12/16/22) Goal status: MET  2.  Pt will ambulate 23' with RW with SBA on flat, even surface for increased community accessibility. Baseline: 230 ft CGA no AD (9/11) Goal status: Met   3.  Report decreased back pain to </= 3/10 intensity for increased ease & comfort with ADL's & mobility.  Baseline: 5/10, 6/10 (9/11);  8-9/10 intensity reported on 12-13-22 Goal status: Not met  4.  Improve 5x sit to stand score to </= 16 secs from chair without UE support to demo improved LE strength.  Baseline: 21.47 secs, 15.62 sec no UE (9/11);  19.68 secs on 11-21-22;   16.07 secs Goal status:  Partially met  12-13-22  5.  Improve TUG score to </= 17.5 secs with RW to demo improved functional mobility.  Baseline: 21.47 secs with RW, 22 sec with RW (9/11); 21.12 secs on 11-21-22; 12-13-22:   21.82 secs with RW, 20.56 secs with no device, 19.81 with SPC Goal status: Not met - Ongoing  12-13-22  6.  Increase gait speed to >/= 1.9 ft/sec with RW with pt demonstrating bil. Knee extension in stance.  Baseline: 1.52 ft/sec with RW (21.56 secs), 1.52 ft/sec with RW (9/11); 1.67 ft/sec with RW (10-14);  12-13-22:  21.73 with RW, 20.88 secs without RW = 1.57 ft/sec Goal status: Partially met  12-13-22   UPDATED LONG TERM GOALS: Target date: 01-13-23  Improve Berg score  >/= 48/56 to reduce fall risk & demo improved balance. Baseline: 30/56 (8/19); score 41/56 on 11-21-22, 45/56 (12/16/22); score 43/56 on 01-10-23 Goal status: Not met 01-10-23  2.  Pt will be modified independent with household ambulation without device. Baseline: pt reports she is walking short distances in home without use of RW, but still using RW for longer distances in the home Goal status: Partially met - 11-21-22; ONGOING 01-10-23  3.  Improve 5x sit to stand score to </= 14 secs from chair without UE support to demo improved LE strength.  Baseline: 21.47 secs;  19.68 secs:   01-10-23   24.10 secs;   01-12-23 17.78 Goal status: Goal not met 11-21-22;  NOT MET 01-10-23  4.  Improve TUG score to </= 14.5 secs with RW to demo improved functional mobility.  Baseline: 21.47 secs with RW;  21.12 secs:   20.41 secs,  18.81 secs without RW;   26.19 secs with RW (01-10-23);   21.56 secs with RW, 19.19 secs without RW Goal status: Not met 11-21-22; ONGOING 01-12-23  5.   Increase gait speed to >/= 2.3 ft/sec with RW with pt demonstrating bil. Knee extension in stance.  Baseline: 1.52 ft/sec with RW (21.56 secs); 19.62,  20.00 = 1.67 ft/sec with RW;    01-12-23:  23.65, 23.19 secs = 1.41 ft/sec with RW Goal status: Goal not met 11-21-22; ONGOING  6.  Independent in HEP for balance and LE strengthening and core stabilization exercises. Baseline:  Goal status: Goal met 11-21-22  7.  Pt will transfer floor to stand with UE support on mat table with SBA.  Baseline:  TBA  Goal status:  ONGOING   UPDATED LONG TERM GOALS: Target date: 02-17-23  (some initial LTG's revised)   Improve Berg score  >/= 48/56 to reduce fall risk & demo improved balance. Baseline: 30/56 (8/19); score 41/56 on 11-21-22, 45/56 (12/16/22);  score 43/56 on 01-10-23 Goal status: Ongoing  2.  Pt will be modified independent with household ambulation  without device. Baseline: pt reports she is walking short distances in home without use of RW, but still using RW for longer distances in the home Goal status: Ongoing  3.  Improve 5x sit to stand score to </= 15 secs from chair without UE support without LOB to demo improved LE strength.  Baseline: 21.47 secs;  19.68 secs:   01-10-23   24.10 secs;   01-12-23 17.78 Goal status: Revised  4.  Improve TUG score to </= 17 secs with RW to demo improved functional mobility.  Baseline: 21.47 secs with RW;  21.12 secs:   20.41 secs,  18.81 secs without RW;   26.19 secs with RW (01-10-23);   21.56 secs with RW, 19.19 secs without RW Goal status:  Revised  5.   Increase gait speed to >/= 2.3 ft/sec with RW with pt demonstrating bil. Knee extension in stance.  Baseline: 1.52 ft/sec with RW (21.56 secs); 19.62,  20.00 = 1.67 ft/sec with RW;    01-12-23:  23.65, 23.19 secs = 1.41 ft/sec with RW Goal status: Revised  6.  Independent in updated HEP for balance and LE strengthening and core stabilization exercises. Baseline:  Goal status: NEW  7.  Pt will transfer floor to stand with UE support on mat table with SBA.  Baseline:  TBA  Goal status:  ONGOING   ASSESSMENT:  CLINICAL IMPRESSION:  PT session focused on completion of assessment of LTG's (LTG's #1-3 assessed in previous PT session this week); pt has not met LTG #4 with TUG score 21.56 secs with RW (goal set at </= 14.5 secs with RW); LTG #5 not met as gait velocity = 1.41 ft/sec with RW (goal >2.3 ft/sec); LTG #6 met as pt is independent in HEP.  LTG #7 deferred at this time due to skin tears from previous fall - this goal of floor to stand transfer to be assessed when appropriate.  Renew for 4 more weeks due to pt's slight decline in status with pt having recurrent falls in past month.  Continue POC.   OBJECTIVE IMPAIRMENTS: Abnormal gait, decreased activity tolerance, decreased balance, decreased strength, and pain.   ACTIVITY LIMITATIONS:  carrying, lifting, bending, squatting, stairs, transfers, reach over head, and locomotion level  PARTICIPATION LIMITATIONS: meal prep, cleaning, laundry, shopping, community activity, and pt reports she does not drive  PERSONAL FACTORS: Past/current experiences, Time since onset of injury/illness/exacerbation, and 1 comorbidity: MS and s/p back surgery  are also affecting patient's functional outcome.   REHAB POTENTIAL: Good  CLINICAL DECISION MAKING: Evolving/moderate complexity  EVALUATION COMPLEXITY: Moderate  PLAN:  PT FREQUENCY: 2x/week  PT DURATION: 4 weeks (8 additional visits)  PLANNED INTERVENTIONS: Therapeutic exercises, Therapeutic activity, Neuromuscular re-education, Balance training, Gait training, Patient/Family education, Self Care, Stair training, DME instructions, and Aquatic Therapy  PLAN FOR NEXT SESSION: Cont balance & LE strengthening Cont gait training with SPC with rubber quad base, balance training, floor transfer practice, single leg stance, stance progressions, LE strengthening, endurance, balance on compliant surfaces, hip abduction and flexion strengthening, countertop balance     Kerry Fort, PT [facility] [address], [address] [place], [place] [address] [phone]   01/13/2023, 4:07 PM

## 2023-01-13 ENCOUNTER — Encounter: Payer: Self-pay | Admitting: Physical Therapy

## 2023-01-16 ENCOUNTER — Ambulatory Visit: Payer: PPO | Admitting: Physical Medicine and Rehabilitation

## 2023-01-16 DIAGNOSIS — S41111D Laceration without foreign body of right upper arm, subsequent encounter: Secondary | ICD-10-CM | POA: Diagnosis not present

## 2023-01-16 DIAGNOSIS — L97912 Non-pressure chronic ulcer of unspecified part of right lower leg with fat layer exposed: Secondary | ICD-10-CM | POA: Diagnosis not present

## 2023-01-16 DIAGNOSIS — M797 Fibromyalgia: Secondary | ICD-10-CM | POA: Diagnosis not present

## 2023-01-16 DIAGNOSIS — W010XXD Fall on same level from slipping, tripping and stumbling without subsequent striking against object, subsequent encounter: Secondary | ICD-10-CM | POA: Diagnosis not present

## 2023-01-16 DIAGNOSIS — F419 Anxiety disorder, unspecified: Secondary | ICD-10-CM | POA: Diagnosis not present

## 2023-01-16 DIAGNOSIS — G8929 Other chronic pain: Secondary | ICD-10-CM | POA: Diagnosis not present

## 2023-01-17 DIAGNOSIS — N3020 Other chronic cystitis without hematuria: Secondary | ICD-10-CM | POA: Diagnosis not present

## 2023-01-17 DIAGNOSIS — R3914 Feeling of incomplete bladder emptying: Secondary | ICD-10-CM | POA: Diagnosis not present

## 2023-01-18 ENCOUNTER — Ambulatory Visit: Payer: PPO | Admitting: Physical Therapy

## 2023-01-18 DIAGNOSIS — R2689 Other abnormalities of gait and mobility: Secondary | ICD-10-CM | POA: Diagnosis not present

## 2023-01-18 DIAGNOSIS — R262 Difficulty in walking, not elsewhere classified: Secondary | ICD-10-CM

## 2023-01-18 DIAGNOSIS — M6281 Muscle weakness (generalized): Secondary | ICD-10-CM

## 2023-01-18 DIAGNOSIS — G35 Multiple sclerosis: Secondary | ICD-10-CM

## 2023-01-18 DIAGNOSIS — R2681 Unsteadiness on feet: Secondary | ICD-10-CM

## 2023-01-18 DIAGNOSIS — R269 Unspecified abnormalities of gait and mobility: Secondary | ICD-10-CM

## 2023-01-18 NOTE — Therapy (Signed)
OUTPATIENT PHYSICAL THERAPY NEURO TREATMENT NOTE    Patient Name: Katrina Rivera MRN: [id number] DOB:Aug 10, 1945, 77 y.o., female Today's Date: 01/18/2023    PCP: Noberto Retort, MD REFERRING PROVIDER: Charlton Amor, PA-C  END OF SESSION:  PT End of Session - 01/18/23 0933     Visit Number 23    Number of Visits 30    Date for PT Re-Evaluation 02/10/23    Authorization Type [employer]    Authorization Time Period 09-20-22 - 12-08-22; 11-21-22 - 02-07-23;  01-12-23 - 03-10-23    Progress Note Due on Visit 30    PT Start Time 0930    PT Stop Time 1015    PT Time Calculation (min) 45 min    Equipment Utilized During Treatment Gait belt    Activity Tolerance Patient tolerated treatment well    Behavior During Therapy WFL for tasks assessed/performed                         Past Medical History:  Diagnosis Date   Addison's disease (HCC)    takes Solu Cortef daily   Anemia    takes Ferrous Sulfate daily   Anxiety    takes Xanax nightly   Arthritis    Chronic back pain    stenosis   Depression    takes Cymbalta daily   Dizziness    if b/p drops    Fibromyalgia    History of blood transfusion    no abnormal reaction noted   History of bronchitis    many yrs ago    Hypokalemia    takes Potassium daily   Hypotension    takes Florinef daily   IBS (irritable bowel syndrome)    takes Align daily   Insomnia    takes Trazodone nightly   Joint pain    Multiple sclerosis (HCC)    doesn't take any meds   Multiple sclerosis (HCC)    New onset headache 12/22/2021   Nocturia    Osteoporosis    Palpitations    Peripheral neuropathy    Primary localized osteoarthritis of left knee 08/11/2020   Restless leg syndrome    Seasonal allergies    takes Zyrtec daily;uses Flonase daily as needed   Syncope    when missed methylprednisolone dose   Urinary frequency    takes Flomax daily   Weakness    numbness and tingling   Past Surgical History:   Procedure Laterality Date   ABDOMINAL HYSTERECTOMY  02/07/1986   APPENDECTOMY  02/07/1986   CESAREAN SECTION  1973/1977   x2   CHOLECYSTECTOMY  02/08/1995   COLECTOMY  02/08/1988   COLONOSCOPY     ESOPHAGOGASTRODUODENOSCOPY     EYE SURGERY     bilateral - /w IOL- cataracts   FRACTURE SURGERY Right    rods and screws-right leg   LUMBAR LAMINECTOMY/DECOMPRESSION MICRODISCECTOMY Left 01/24/2013   Procedure: LUMBAR FIVE TO SACRAL ONE LUMBAR LAMINECTOMY/DECOMPRESSION MICRODISCECTOMY 1 LEVEL;  Surgeon: Tia Alert, MD;  Location: [facility];  Service: Neurosurgery;  Laterality: Left;   MAXIMUM ACCESS (MAS)POSTERIOR LUMBAR INTERBODY FUSION (PLIF) 1 LEVEL N/A 06/18/2014   Procedure: MAXIMUM ACCESS SURGERY POSTERIOR LUMBAR INTERBODY FUSION LUMBAR FIVE TO SACRAL ONE ;  Surgeon: Tia Alert, MD;  Location: [facility];  Service: Neurosurgery;  Laterality: N/A;   SPINAL CORD STIMULATOR BATTERY EXCHANGE Right 07/30/2021   Procedure: Spinal cord stimulator battery replacement;  Surgeon: Tia Alert, MD;  Location: [facility];  Service: Neurosurgery;  Laterality: Right;   SPINAL CORD STIMULATOR IMPLANT     TOTAL KNEE ARTHROPLASTY Left 08/24/2020   Procedure: TOTAL KNEE ARTHROPLASTY;  Surgeon: Salvatore Marvel, MD;  Location: [facility];  Service: Orthopedics;  Laterality: Left;   Patient Active Problem List   Diagnosis Date Noted   Myofascial pain dysfunction syndrome 11/16/2022   Vertigo 11/16/2022   Lumbar burst fracture (HCC) 08/23/2022   Lumbar vertebral fracture (HCC) 08/19/2022   Chronic migraine w/o aura w/o status migrainosus, not intractable 03/29/2022   Mixed hyperlipidemia 12/27/2021   Chest pain of uncertain etiology 12/27/2021   New onset headache 12/22/2021   S/P lumbar fusion 07/30/2021   Primary localized osteoarthritis of left knee 08/11/2020   Atrophic vaginitis 08/11/2020   Menopausal symptom 08/11/2020   Restless leg syndrome    Chronic back pain    Low back pain without  sciatica 11/01/2018   Essential hypertension    SBO (small bowel obstruction) (HCC) 09/01/2016   Fibromyalgia    Abdominal pain, vomiting, and diarrhea    Dehydration    Somnolence, daytime 10/22/2015   Fatigue 10/22/2015   Chronic leg pain 06/29/2015   S/P lumbar spinal fusion 06/18/2014   Abnormality of gait 02/18/2013   S/P lumbar microdiscectomy 01/24/2013   Hypokalemia 12/24/2012   Sweating abnormality 10/19/2012   Dysautonomia orthostatic hypotension syndrome 08/16/2012   Hereditary and idiopathic peripheral neuropathy 08/16/2012   Chronic adrenal insufficiency (HCC) 08/16/2012   MS (multiple sclerosis) (HCC) 08/15/2012    ONSET DATE: 08-17-22  REFERRING DIAG: G35 (ICD-10-CM) - Multiple sclerosis   Diagnosis  S32.039D (ICD-10-CM) - Closed fracture of third lumbar vertebra with routine healing, unspecified fracture morphology, subsequent encounter    THERAPY DIAG:  Other abnormalities of gait and mobility  Unsteadiness on feet  Muscle weakness (generalized)  MS (multiple sclerosis) (HCC)  Difficulty in walking, not elsewhere classified  Abnormality of gait  Rationale for Evaluation and Treatment: Rehabilitation  SUBJECTIVE:                                                                                                                                                                                             SUBJECTIVE STATEMENT:  Pt reports she has another UTI, her urine is being cultured and she is going back to the doctor later this week. No falls since last visit, has had some near falls, has walked through her house without her RW but has been using her cane.  Pt going to get wound care for her RUE and RLE, not sure when this starts.  Pt accompanied by: self  PERTINENT  HISTORY: spinal cord stimulator implant; MS diagnosed in 1984; s/p 2 back surgeries  Per Chart note "Brief HPI:   Katrina Rivera is a 77 y.o. female with a history of relapsing remitting  multiple sclerosis who was evaluated by Dr. Yetta Barre regarding gait abnormality, chronic low back pain and status post lumbar fusion May 2024. She has reportedly fallen 4 times at home after PLIF of L3-5. She was admitted 08/17/2022 for scheduled surgery of L3 spinal fracture. She underwent posterior fixation L2-L4 using NuVasive cortical pedicle screws, arthrodesis and removal of segmental fixation L3-L5 by Dr. Yetta Barre. PT OT evaluations carried out. Incision is healed without signs of infection. She is appropriate back soreness. She has no complaints of leg pain or new numbness, tingling or weakness per Dr. Barnett Applebaum note today. Signs are stable and she is tolerating regular diet. Other past medical history includes restless leg syndrome, anxiety, headaches with migraine features, fibromyalgia, hypotension, anemia (acute on chronic)."    PAIN:  Are you having pain? Yes: NPRS scale: 5/10 Pain location: low back pain into legs)  Pain description: tightness, throbbing, sore Aggravating factors: no specific Relieving factors: rest (TLSO has been discontinued by MD)   PRECAUTIONS: Back  - No bending, lifting >10#, twisting, reaching overhead; Dr Yetta Barre responded with "regular back precautions" via inbasket message inquiring about precautions  RED FLAGS: None   WEIGHT BEARING RESTRICTIONS: No  FALLS: Has patient fallen in last 6 months? Yes. Number of falls 4  LIVING ENVIRONMENT: Lives with: lives with their spouse Lives in: House/apartment Stairs: Yes: External: 1 steps; planning on having rail installed Has following equipment at home: Walker - 2 wheeled, shower chair, and Grab bars  PLOF: Independent with household mobility with device, Requires assistive device for independence, and Needs assistance with ADLs  PATIENT GOALS: "walk without walker and back brace and get back to where I was before"  OBJECTIVE:   DIAGNOSTIC FINDINGS: IMPRESSION: 1. Unhealed fracture of the posterosuperior L3 body,  ans associated avulsed appearance of both L3 pedicles (series 7, image 30) along the course of the bilateral L3 pedicle screws. Mild retropulsion of the posterior body fragment does not result in significant spinal stenosis. 2. No other acute osseous abnormality identified in the Lumbar Spine. But scant arthrodesis limited to only the right L5-S1 posterior elements following L3 through S1 fusion. Some endplate subsidence at each level. 3.  Aortic Atherosclerosis (ICD10-I70.0).  Nephrolithiasis.  COGNITION: Overall cognitive status: Within functional limits for tasks assessed   SENSATION: Impaired sensation in arms and legs - numbness and tingling  COORDINATION: WFL's - slowed movement due to back pain  POSTURE: rounded shoulders and forward head  LOWER EXTREMITY ROM:   WFL's bil. LE's   LOWER EXTREMITY MMT:  grossly 4/5 throughout but pain with resistance due to back pain (surgical site)  BED MOBILITY:  Independent   TRANSFERS: Assistive device utilized: Environmental consultant - 4 wheeled  Sit to stand: Modified independence Stand to sit: Modified independence  STAIRS:  TBA  GAIT: Gait pattern:  increased knee flexion bil. LE's in stance  and step through pattern Distance walked: 3' Assistive device utilized: Environmental consultant - 2 wheeled Level of assistance: SBA Comments: pt amb. With bil. Knees flexed  FUNCTIONAL TESTS:  5 times sit to stand: 21.47 secs from chair without UE support with close CGA Timed up and go (TUG): 21.47 secs with RW 10 meter walk test: 21.56 = 1.52 ft/sec with RW Berg Balance Scale: to be assessed  - pt stood  for 2" without UE support with SBA  PATIENT SURVEYS:  N/A - dx is MS  TODAY'S TREATMENT:  Gait Gait pattern: decreased hip/knee flexion- Right and knee flexed in stance- Left Distance walked: 115 ft Assistive device utilized:  hurrycane Level of assistance: CGA Comments: overall good safety with use of hurrycane this date in therapy gym, encouraged pt  to continue to use her cane at home indoors and RW for community mobility   TherAct Obstacle course to work on increasing step length/height: Stepping over 3 2" tall foam beams 6 x 10 ft with hurrycane, does better leading with cane Several LOB needing min A to recover to prevent a fall Added in cone weaves with hurrycane and min A, no increase in difficulty with cone weave  Forwards/backwards stepping over 2" foam beam 3 x 10 reps each with hurrycane and min A for balance Decreased balance when stepping with LLE as compared to her RLE Fatigues quickly and requires a seated rest break after each set    PATIENT EDUCATION:  Education details: continue HEP, use cane indoors at home and RW for community mobility Person educated: Patient Education method: Explanation, Demonstration, Tactile cues, and Verbal cues Education comprehension: verbalized understanding, returned demonstration, verbal cues required, tactile cues required, and needs further education  HOME EXERCISE PROGRAM:  Access Code: [email or web address] (combined into one HEP 12/16/22) URL: [email or web address] Date: 12/16/2022 Prepared by: Peter Congo  Exercises - Ankle Dorsiflexion with Resistance  - 1 x daily - 7 x weekly - 3 sets - 10 reps - Gastroc Stretch on Wall  - 1 x daily - 7 x weekly - 3 sets - 10 reps - Supine Knee Extension Stretch on Towel Roll  - 1 x daily - 7 x weekly - Supine Quad Set  - 1 x daily - 7 x weekly - 3 sets - 10 reps - Sit to Stand with Arms Crossed  - 1 x daily - 7 x weekly - 3 sets - 10 reps - Supine Bridge  - 1 x daily - 7 x weekly - 3 sets - 10 reps - Side Stepping with Resistance at Ankles and Counter Support  - 1 x daily - 7 x weekly - 3 sets - 10 reps - Supine Hamstring Stretch  - 1 x daily - 7 x weekly - 1 sets - 2-3 reps - 60 sec hold - Single Leg Stance with Support  - 1 x daily - 7 x weekly - 1 sets - 2-3 reps - 10 sec hold - Alternating Step Forward with Support  - 1 x daily -  7 x weekly - 1 sets - 10 reps - Step Sideways  - 1 x daily - 7 x weekly - 1 sets - 10 reps - Alternating Step Backward with Support  - 1 x daily - 7 x weekly - 1 sets - 10 reps - Side Stepping with Counter Support  - 1 x daily - 7 x weekly - 1 sets - 2-3 reps - Standing March with Counter Support  - 1 x daily - 7 x weekly - 1 sets - 10 reps - Standing Hip Extension with Counter Support  - 1 x daily - 7 x weekly - 3 sets - 10 reps - Standing Knee Flexion with Counter Support  - 1 x daily - 7 x weekly - 3 sets - 10 reps - Heel Raises with Counter Support  - 1 x daily - 7 x weekly - 3 sets - 10  reps - Standing Hip Abduction with Counter Support  - 1 x daily - 7 x weekly - 3 sets - 10 reps   GOALS: Goals reviewed with patient? Yes   UPDATED LONG TERM GOALS: Target date: 02-17-23  (some initial LTG's revised)   Improve Berg score  >/= 48/56 to reduce fall risk & demo improved balance. Baseline: 30/56 (8/19); score 41/56 on 11-21-22, 45/56 (12/16/22); score 43/56 on 01-10-23 Goal status: Ongoing  2.  Pt will be modified independent with household ambulation without device. Baseline: pt reports she is walking short distances in home without use of RW, but still using RW for longer distances in the home Goal status: Ongoing  3.  Improve 5x sit to stand score to </= 15 secs from chair without UE support without LOB to demo improved LE strength.  Baseline: 21.47 secs;  19.68 secs:   01-10-23   24.10 secs;   01-12-23 17.78 Goal status: Revised  4.  Improve TUG score to </= 17 secs with RW to demo improved functional mobility.  Baseline: 21.47 secs with RW;  21.12 secs:   20.41 secs,  18.81 secs without RW;   26.19 secs with RW (01-10-23);   21.56 secs with RW, 19.19 secs without RW Goal status:  Revised  5.   Increase gait speed to >/= 2.3 ft/sec with RW with pt demonstrating bil. Knee extension in stance.  Baseline: 1.52 ft/sec with RW (21.56 secs); 19.62,  20.00 = 1.67 ft/sec with RW;    01-12-23:   23.65, 23.19 secs = 1.41 ft/sec with RW Goal status: Revised  6.  Independent in updated HEP for balance and LE strengthening and core stabilization exercises. Baseline:  Goal status: NEW  7.  Pt will transfer floor to stand with UE support on mat table with SBA.  Baseline:  TBA  Goal status:  ONGOING   ASSESSMENT:  CLINICAL IMPRESSION:  Emphasis of skilled PT session on continuing to work on gait with "hurrycane" in clinic, obstacle navigation, and increasing step height/length with SLS stability. Pt with more difficulty navigating over obstacles as compared to navigating around cones. Pt also challenged by stepping forwards/backwards over foam beam. Pt continues to benefit from skilled therapy services to work towards increased safety and independence with functional mobility. Continue POC.   OBJECTIVE IMPAIRMENTS: Abnormal gait, decreased activity tolerance, decreased balance, decreased strength, and pain.   ACTIVITY LIMITATIONS: carrying, lifting, bending, squatting, stairs, transfers, reach over head, and locomotion level  PARTICIPATION LIMITATIONS: meal prep, cleaning, laundry, shopping, community activity, and pt reports she does not drive  PERSONAL FACTORS: Past/current experiences, Time since onset of injury/illness/exacerbation, and 1 comorbidity: MS and s/p back surgery  are also affecting patient's functional outcome.   REHAB POTENTIAL: Good  CLINICAL DECISION MAKING: Evolving/moderate complexity  EVALUATION COMPLEXITY: Moderate  PLAN:  PT FREQUENCY: 2x/week  PT DURATION: 4 weeks (8 additional visits)  PLANNED INTERVENTIONS: Therapeutic exercises, Therapeutic activity, Neuromuscular re-education, Balance training, Gait training, Patient/Family education, Self Care, Stair training, DME instructions, and Aquatic Therapy  PLAN FOR NEXT SESSION: Cont balance & LE strengthening Cont gait training with SPC with rubber quad base, balance training, floor transfer  practice, single leg stance, stance progressions, LE strengthening, endurance, balance on compliant surfaces, hip abduction and flexion strengthening, countertop balance      Peter Congo, PT Peter Congo, PT, DPT, CSRS  01/18/2023, 10:16 AM   [facility] [address], [address] [place], [place] [address] [phone]

## 2023-01-20 ENCOUNTER — Ambulatory Visit: Payer: PPO | Admitting: Physical Therapy

## 2023-01-20 DIAGNOSIS — R2681 Unsteadiness on feet: Secondary | ICD-10-CM

## 2023-01-20 DIAGNOSIS — G35 Multiple sclerosis: Secondary | ICD-10-CM

## 2023-01-20 DIAGNOSIS — R2689 Other abnormalities of gait and mobility: Secondary | ICD-10-CM

## 2023-01-20 DIAGNOSIS — M6281 Muscle weakness (generalized): Secondary | ICD-10-CM

## 2023-01-20 DIAGNOSIS — R269 Unspecified abnormalities of gait and mobility: Secondary | ICD-10-CM

## 2023-01-20 DIAGNOSIS — R262 Difficulty in walking, not elsewhere classified: Secondary | ICD-10-CM

## 2023-01-20 NOTE — Therapy (Signed)
OUTPATIENT PHYSICAL THERAPY NEURO TREATMENT NOTE    Patient Name: Katrina Rivera MRN: [id number] DOB:24-Aug-1945, 77 y.o., female Today's Date: 01/20/2023    PCP: Noberto Retort, MD REFERRING PROVIDER: Charlton Amor, PA-C  END OF SESSION:  PT End of Session - 01/20/23 1233     Visit Number 24    Number of Visits 30    Date for PT Re-Evaluation 02/10/23    Authorization Type [employer]    Authorization Time Period 09-20-22 - 12-08-22; 11-21-22 - 02-07-23;  01-12-23 - 03-10-23    Progress Note Due on Visit 30    PT Start Time 1230    PT Stop Time 1320    PT Time Calculation (min) 50 min    Equipment Utilized During Treatment Gait belt    Activity Tolerance Patient tolerated treatment well    Behavior During Therapy WFL for tasks assessed/performed                         Past Medical History:  Diagnosis Date   Addison's disease (HCC)    takes Solu Cortef daily   Anemia    takes Ferrous Sulfate daily   Anxiety    takes Xanax nightly   Arthritis    Chronic back pain    stenosis   Depression    takes Cymbalta daily   Dizziness    if b/p drops    Fibromyalgia    History of blood transfusion    no abnormal reaction noted   History of bronchitis    many yrs ago    Hypokalemia    takes Potassium daily   Hypotension    takes Florinef daily   IBS (irritable bowel syndrome)    takes Align daily   Insomnia    takes Trazodone nightly   Joint pain    Multiple sclerosis (HCC)    doesn't take any meds   Multiple sclerosis (HCC)    New onset headache 12/22/2021   Nocturia    Osteoporosis    Palpitations    Peripheral neuropathy    Primary localized osteoarthritis of left knee 08/11/2020   Restless leg syndrome    Seasonal allergies    takes Zyrtec daily;uses Flonase daily as needed   Syncope    when missed methylprednisolone dose   Urinary frequency    takes Flomax daily   Weakness    numbness and tingling   Past Surgical History:   Procedure Laterality Date   ABDOMINAL HYSTERECTOMY  02/07/1986   APPENDECTOMY  02/07/1986   CESAREAN SECTION  1973/1977   x2   CHOLECYSTECTOMY  02/08/1995   COLECTOMY  02/08/1988   COLONOSCOPY     ESOPHAGOGASTRODUODENOSCOPY     EYE SURGERY     bilateral - /w IOL- cataracts   FRACTURE SURGERY Right    rods and screws-right leg   LUMBAR LAMINECTOMY/DECOMPRESSION MICRODISCECTOMY Left 01/24/2013   Procedure: LUMBAR FIVE TO SACRAL ONE LUMBAR LAMINECTOMY/DECOMPRESSION MICRODISCECTOMY 1 LEVEL;  Surgeon: Tia Alert, MD;  Location: [facility];  Service: Neurosurgery;  Laterality: Left;   MAXIMUM ACCESS (MAS)POSTERIOR LUMBAR INTERBODY FUSION (PLIF) 1 LEVEL N/A 06/18/2014   Procedure: MAXIMUM ACCESS SURGERY POSTERIOR LUMBAR INTERBODY FUSION LUMBAR FIVE TO SACRAL ONE ;  Surgeon: Tia Alert, MD;  Location: [facility];  Service: Neurosurgery;  Laterality: N/A;   SPINAL CORD STIMULATOR BATTERY EXCHANGE Right 07/30/2021   Procedure: Spinal cord stimulator battery replacement;  Surgeon: Tia Alert, MD;  Location: [facility];  Service: Neurosurgery;  Laterality: Right;   SPINAL CORD STIMULATOR IMPLANT     TOTAL KNEE ARTHROPLASTY Left 08/24/2020   Procedure: TOTAL KNEE ARTHROPLASTY;  Surgeon: Salvatore Marvel, MD;  Location: [facility];  Service: Orthopedics;  Laterality: Left;   Patient Active Problem List   Diagnosis Date Noted   Myofascial pain dysfunction syndrome 11/16/2022   Vertigo 11/16/2022   Lumbar burst fracture (HCC) 08/23/2022   Lumbar vertebral fracture (HCC) 08/19/2022   Chronic migraine w/o aura w/o status migrainosus, not intractable 03/29/2022   Mixed hyperlipidemia 12/27/2021   Chest pain of uncertain etiology 12/27/2021   New onset headache 12/22/2021   S/P lumbar fusion 07/30/2021   Primary localized osteoarthritis of left knee 08/11/2020   Atrophic vaginitis 08/11/2020   Menopausal symptom 08/11/2020   Restless leg syndrome    Chronic back pain    Low back pain without  sciatica 11/01/2018   Essential hypertension    SBO (small bowel obstruction) (HCC) 09/01/2016   Fibromyalgia    Abdominal pain, vomiting, and diarrhea    Dehydration    Somnolence, daytime 10/22/2015   Fatigue 10/22/2015   Chronic leg pain 06/29/2015   S/P lumbar spinal fusion 06/18/2014   Abnormality of gait 02/18/2013   S/P lumbar microdiscectomy 01/24/2013   Hypokalemia 12/24/2012   Sweating abnormality 10/19/2012   Dysautonomia orthostatic hypotension syndrome 08/16/2012   Hereditary and idiopathic peripheral neuropathy 08/16/2012   Chronic adrenal insufficiency (HCC) 08/16/2012   MS (multiple sclerosis) (HCC) 08/15/2012    ONSET DATE: 08-17-22  REFERRING DIAG: G35 (ICD-10-CM) - Multiple sclerosis   Diagnosis  S32.039D (ICD-10-CM) - Closed fracture of third lumbar vertebra with routine healing, unspecified fracture morphology, subsequent encounter    THERAPY DIAG:  Other abnormalities of gait and mobility  Unsteadiness on feet  Muscle weakness (generalized)  Difficulty in walking, not elsewhere classified  MS (multiple sclerosis) (HCC)  Abnormality of gait  Rationale for Evaluation and Treatment: Rehabilitation  SUBJECTIVE:                                                                                                                                                                                             SUBJECTIVE STATEMENT:  Pt reports one near fall since last visit in the kitchen, her shoes caught on the floor and she tripped, was able to catch herself.  Pt accompanied by: self  PERTINENT HISTORY: spinal cord stimulator implant; MS diagnosed in 1984; s/p 2 back surgeries  Per Chart note "Brief HPI:   Katrina Rivera is a 77 y.o. female with a history of relapsing remitting multiple sclerosis who  was evaluated by Dr. Yetta Barre regarding gait abnormality, chronic low back pain and status post lumbar fusion May 2024. She has reportedly fallen 4 times at home  after PLIF of L3-5. She was admitted 08/17/2022 for scheduled surgery of L3 spinal fracture. She underwent posterior fixation L2-L4 using NuVasive cortical pedicle screws, arthrodesis and removal of segmental fixation L3-L5 by Dr. Yetta Barre. PT OT evaluations carried out. Incision is healed without signs of infection. She is appropriate back soreness. She has no complaints of leg pain or new numbness, tingling or weakness per Dr. Barnett Applebaum note today. Signs are stable and she is tolerating regular diet. Other past medical history includes restless leg syndrome, anxiety, headaches with migraine features, fibromyalgia, hypotension, anemia (acute on chronic)."    PAIN:  Are you having pain? Yes: NPRS scale: 5/10 Pain location: low back pain into legs)  Pain description: tightness, throbbing, sore Aggravating factors: no specific Relieving factors: rest (TLSO has been discontinued by MD)   PRECAUTIONS: Back  - No bending, lifting >10#, twisting, reaching overhead; Dr Yetta Barre responded with "regular back precautions" via inbasket message inquiring about precautions  RED FLAGS: None   WEIGHT BEARING RESTRICTIONS: No  FALLS: Has patient fallen in last 6 months? Yes. Number of falls 4  LIVING ENVIRONMENT: Lives with: lives with their spouse Lives in: House/apartment Stairs: Yes: External: 1 steps; planning on having rail installed Has following equipment at home: Walker - 2 wheeled, shower chair, and Grab bars  PLOF: Independent with household mobility with device, Requires assistive device for independence, and Needs assistance with ADLs  PATIENT GOALS: "walk without walker and back brace and get back to where I was before"  OBJECTIVE:   DIAGNOSTIC FINDINGS: IMPRESSION: 1. Unhealed fracture of the posterosuperior L3 body, ans associated avulsed appearance of both L3 pedicles (series 7, image 30) along the course of the bilateral L3 pedicle screws. Mild retropulsion of the posterior body fragment  does not result in significant spinal stenosis. 2. No other acute osseous abnormality identified in the Lumbar Spine. But scant arthrodesis limited to only the right L5-S1 posterior elements following L3 through S1 fusion. Some endplate subsidence at each level. 3.  Aortic Atherosclerosis (ICD10-I70.0).  Nephrolithiasis.  COGNITION: Overall cognitive status: Within functional limits for tasks assessed   SENSATION: Impaired sensation in arms and legs - numbness and tingling  COORDINATION: WFL's - slowed movement due to back pain  POSTURE: rounded shoulders and forward head  LOWER EXTREMITY ROM:   WFL's bil. LE's   LOWER EXTREMITY MMT:  grossly 4/5 throughout but pain with resistance due to back pain (surgical site)  BED MOBILITY:  Independent   TRANSFERS: Assistive device utilized: Environmental consultant - 4 wheeled  Sit to stand: Modified independence Stand to sit: Modified independence  STAIRS:  TBA  GAIT: Gait pattern:  increased knee flexion bil. LE's in stance  and step through pattern Distance walked: 52' Assistive device utilized: Environmental consultant - 2 wheeled Level of assistance: SBA Comments: pt amb. With bil. Knees flexed  FUNCTIONAL TESTS:  5 times sit to stand: 21.47 secs from chair without UE support with close CGA Timed up and go (TUG): 21.47 secs with RW 10 meter walk test: 21.56 = 1.52 ft/sec with RW Berg Balance Scale: to be assessed  - pt stood for 2" without UE support with SBA  PATIENT SURVEYS:  N/A - dx is MS  TODAY'S TREATMENT:   TherAct Floor Recovery: Patient educated in floor recovery this visit using teach-back for injury assessment and sequencing  of task in clinic setting.  Discussion of transfer of skills to variable scenarios outside the clinic.  Patient has most difficulty with bringing her LLE down when transferring from standing to tall-kneeling as well as coming from sitting on floor to quadruped due to decreased B hip mobility and LE weakness.  Performed 1  time. Level of Assist:  MinA.      TherEx Assessed B hip mobility in supine due to pt complaints of hip tightness and difficulty lifting her LE to perform ADLs (donning shoes, etc) Heavily guarding L hip>R hip so difficult to assess PROM SKTC stretch 3 x 30 sec each +across chest piriformis stretch 3 x 30 sec each Supine marches x 10 reps B  Added to HEP, see bolded below  Gait Gait pattern: decreased step length- Right, decreased step length- Left, decreased hip/knee flexion- Right, decreased hip/knee flexion- Left, and narrow BOS Distance walked: 345 ft Assistive device utilized:  hurrycane Level of assistance: CGA Comments: decreased gait speed as distance increases   PATIENT EDUCATION:  Education details: continue HEP + added to HEP, use cane indoors at home and RW for community mobility Person educated: Patient Education method: Programmer, multimedia, Facilities manager, Actor cues, and Verbal cues Education comprehension: verbalized understanding, returned demonstration, verbal cues required, tactile cues required, and needs further education  HOME EXERCISE PROGRAM:  Access Code: [email or web address] (combined into one HEP 12/16/22) URL: [email or web address] Date: 12/16/2022 Prepared by: Peter Congo  Exercises - Ankle Dorsiflexion with Resistance  - 1 x daily - 7 x weekly - 3 sets - 10 reps - Gastroc Stretch on Wall  - 1 x daily - 7 x weekly - 3 sets - 10 reps - Supine Knee Extension Stretch on Towel Roll  - 1 x daily - 7 x weekly - Supine Quad Set  - 1 x daily - 7 x weekly - 3 sets - 10 reps - Sit to Stand with Arms Crossed  - 1 x daily - 7 x weekly - 3 sets - 10 reps - Supine Bridge  - 1 x daily - 7 x weekly - 3 sets - 10 reps - Side Stepping with Resistance at Ankles and Counter Support  - 1 x daily - 7 x weekly - 3 sets - 10 reps - Supine Hamstring Stretch  - 1 x daily - 7 x weekly - 1 sets - 2-3 reps - 60 sec hold - Single Leg Stance with Support  - 1 x daily - 7 x weekly  - 1 sets - 2-3 reps - 10 sec hold - Alternating Step Forward with Support  - 1 x daily - 7 x weekly - 1 sets - 10 reps - Step Sideways  - 1 x daily - 7 x weekly - 1 sets - 10 reps - Alternating Step Backward with Support  - 1 x daily - 7 x weekly - 1 sets - 10 reps - Side Stepping with Counter Support  - 1 x daily - 7 x weekly - 1 sets - 2-3 reps - Standing March with Counter Support  - 1 x daily - 7 x weekly - 1 sets - 10 reps - Standing Hip Extension with Counter Support  - 1 x daily - 7 x weekly - 3 sets - 10 reps - Standing Knee Flexion with Counter Support  - 1 x daily - 7 x weekly - 3 sets - 10 reps - Heel Raises with Counter Support  - 1 x daily - 7 x weekly -  3 sets - 10 reps - Standing Hip Abduction with Counter Support  - 1 x daily - 7 x weekly - 3 sets - 10 reps - Supine March  - 1 x daily - 7 x weekly - 3 sets - 10 reps - Supine Piriformis Stretch with Leg Straight  - 1 x daily - 7 x weekly - 1 sets - 3-5 reps - 30 sec hold   GOALS: Goals reviewed with patient? Yes   UPDATED LONG TERM GOALS: Target date: 02-17-23  (some initial LTG's revised)   Improve Berg score  >/= 48/56 to reduce fall risk & demo improved balance. Baseline: 30/56 (8/19); score 41/56 on 11-21-22, 45/56 (12/16/22); score 43/56 on 01-10-23 Goal status: Ongoing  2.  Pt will be modified independent with household ambulation without device. Baseline: pt reports she is walking short distances in home without use of RW, but still using RW for longer distances in the home Goal status: Ongoing  3.  Improve 5x sit to stand score to </= 15 secs from chair without UE support without LOB to demo improved LE strength.  Baseline: 21.47 secs;  19.68 secs:   01-10-23   24.10 secs;   01-12-23 17.78 Goal status: Revised  4.  Improve TUG score to </= 17 secs with RW to demo improved functional mobility.  Baseline: 21.47 secs with RW;  21.12 secs:   20.41 secs,  18.81 secs without RW;   26.19 secs with RW (01-10-23);   21.56  secs with RW, 19.19 secs without RW Goal status:  Revised  5.   Increase gait speed to >/= 2.3 ft/sec with RW with pt demonstrating bil. Knee extension in stance.  Baseline: 1.52 ft/sec with RW (21.56 secs); 19.62,  20.00 = 1.67 ft/sec with RW;    01-12-23:  23.65, 23.19 secs = 1.41 ft/sec with RW Goal status: Revised  6.  Independent in updated HEP for balance and LE strengthening and core stabilization exercises. Baseline:  Goal status: NEW  7.  Pt will transfer floor to stand with UE support on mat table with SBA.  Baseline:  TBA  Goal status:  ONGOING   ASSESSMENT:  CLINICAL IMPRESSION:  Emphasis of skilled PT session on assessing B hip mobility due to pt complaints of weakness and impaired ROM, performing fall recovery/floor transfer, and continuing to work on gait with her "hurrycane". Pt heavily guarding in her hips so difficult to assess ROM, does benefit from addition of hip flexor strengthening exercises and piriformis stretch to address tightness. Pt also with difficulty performing floor transfer, can benefit from continued practice with this. Pt continues to benefit from skilled therapy services to work towards increased safety and independence with functional mobility. Continue POC.   OBJECTIVE IMPAIRMENTS: Abnormal gait, decreased activity tolerance, decreased balance, decreased strength, and pain.   ACTIVITY LIMITATIONS: carrying, lifting, bending, squatting, stairs, transfers, reach over head, and locomotion level  PARTICIPATION LIMITATIONS: meal prep, cleaning, laundry, shopping, community activity, and pt reports she does not drive  PERSONAL FACTORS: Past/current experiences, Time since onset of injury/illness/exacerbation, and 1 comorbidity: MS and s/p back surgery  are also affecting patient's functional outcome.   REHAB POTENTIAL: Good  CLINICAL DECISION MAKING: Evolving/moderate complexity  EVALUATION COMPLEXITY: Moderate  PLAN:  PT FREQUENCY: 2x/week  PT  DURATION: 4 weeks (8 additional visits)  PLANNED INTERVENTIONS: Therapeutic exercises, Therapeutic activity, Neuromuscular re-education, Balance training, Gait training, Patient/Family education, Self Care, Stair training, DME instructions, and Aquatic Therapy  PLAN FOR NEXT SESSION: Cont  balance & LE strengthening Cont gait training with SPC with rubber quad base, balance training, floor transfer practice, single leg stance, stance progressions, LE strengthening, endurance, balance on compliant surfaces, hip abduction and flexion strengthening, countertop balance, resisted step taps, hip ROM and strengthening    Peter Congo, PT Peter Congo, PT, DPT, CSRS  01/20/2023, 1:23 PM   [facility] [address] [place], [place] [address] [phone]

## 2023-01-23 DIAGNOSIS — R3914 Feeling of incomplete bladder emptying: Secondary | ICD-10-CM | POA: Diagnosis not present

## 2023-01-23 DIAGNOSIS — R350 Frequency of micturition: Secondary | ICD-10-CM | POA: Diagnosis not present

## 2023-01-24 ENCOUNTER — Ambulatory Visit: Payer: PPO | Admitting: Physical Therapy

## 2023-01-24 DIAGNOSIS — R2681 Unsteadiness on feet: Secondary | ICD-10-CM

## 2023-01-24 DIAGNOSIS — R269 Unspecified abnormalities of gait and mobility: Secondary | ICD-10-CM

## 2023-01-24 DIAGNOSIS — R2689 Other abnormalities of gait and mobility: Secondary | ICD-10-CM

## 2023-01-24 DIAGNOSIS — R262 Difficulty in walking, not elsewhere classified: Secondary | ICD-10-CM

## 2023-01-24 DIAGNOSIS — M6281 Muscle weakness (generalized): Secondary | ICD-10-CM

## 2023-01-24 DIAGNOSIS — G35 Multiple sclerosis: Secondary | ICD-10-CM

## 2023-01-24 NOTE — Therapy (Signed)
OUTPATIENT PHYSICAL THERAPY NEURO TREATMENT NOTE    Patient Name: Katrina Rivera MRN: [id number] DOB:[birth year], 77 y.o., female Today's Date: 01/24/2023    PCP: Noberto Retort, MD REFERRING PROVIDER: Charlton Amor, PA-C  END OF SESSION:  PT End of Session - 01/24/23 1017     Visit Number 25    Number of Visits 30    Date for PT Re-Evaluation 02/10/23    Authorization Type [employer]    Authorization Time Period 09-20-22 - 12-08-22; 11-21-22 - 02-07-23;  01-12-23 - 03-10-23    Progress Note Due on Visit 30    PT Start Time 1015    PT Stop Time 1100    PT Time Calculation (min) 45 min    Equipment Utilized During Treatment Gait belt    Activity Tolerance Patient tolerated treatment well    Behavior During Therapy WFL for tasks assessed/performed                          Past Medical History:  Diagnosis Date   Addison's disease (HCC)    takes Solu Cortef daily   Anemia    takes Ferrous Sulfate daily   Anxiety    takes Xanax nightly   Arthritis    Chronic back pain    stenosis   Depression    takes Cymbalta daily   Dizziness    if b/p drops    Fibromyalgia    History of blood transfusion    no abnormal reaction noted   History of bronchitis    many yrs ago    Hypokalemia    takes Potassium daily   Hypotension    takes Florinef daily   IBS (irritable bowel syndrome)    takes Align daily   Insomnia    takes Trazodone nightly   Joint pain    Multiple sclerosis (HCC)    doesn't take any meds   Multiple sclerosis (HCC)    New onset headache 12/22/2021   Nocturia    Osteoporosis    Palpitations    Peripheral neuropathy    Primary localized osteoarthritis of left knee 08/11/2020   Restless leg syndrome    Seasonal allergies    takes Zyrtec daily;uses Flonase daily as needed   Syncope    when missed methylprednisolone dose   Urinary frequency    takes Flomax daily   Weakness    numbness and tingling   Past Surgical History:   Procedure Laterality Date   ABDOMINAL HYSTERECTOMY  02/07/1986   APPENDECTOMY  02/07/1986   CESAREAN SECTION  1973/1977   x2   CHOLECYSTECTOMY  02/08/1995   COLECTOMY  02/08/1988   COLONOSCOPY     ESOPHAGOGASTRODUODENOSCOPY     EYE SURGERY     bilateral - /w IOL- cataracts   FRACTURE SURGERY Right    rods and screws-right leg   LUMBAR LAMINECTOMY/DECOMPRESSION MICRODISCECTOMY Left 01/24/2013   Procedure: LUMBAR FIVE TO SACRAL ONE LUMBAR LAMINECTOMY/DECOMPRESSION MICRODISCECTOMY 1 LEVEL;  Surgeon: Tia Alert, MD;  Location: [facility];  Service: Neurosurgery;  Laterality: Left;   MAXIMUM ACCESS (MAS)POSTERIOR LUMBAR INTERBODY FUSION (PLIF) 1 LEVEL N/A 06/18/2014   Procedure: MAXIMUM ACCESS SURGERY POSTERIOR LUMBAR INTERBODY FUSION LUMBAR FIVE TO SACRAL ONE ;  Surgeon: Tia Alert, MD;  Location: [facility];  Service: Neurosurgery;  Laterality: N/A;   SPINAL CORD STIMULATOR BATTERY EXCHANGE Right 07/30/2021   Procedure: Spinal cord stimulator battery replacement;  Surgeon: Tia Alert,  MD;  Location: [facility];  Service: Neurosurgery;  Laterality: Right;   SPINAL CORD STIMULATOR IMPLANT     TOTAL KNEE ARTHROPLASTY Left 08/24/2020   Procedure: TOTAL KNEE ARTHROPLASTY;  Surgeon: Salvatore Marvel, MD;  Location: [facility];  Service: Orthopedics;  Laterality: Left;   Patient Active Problem List   Diagnosis Date Noted   Myofascial pain dysfunction syndrome 11/16/2022   Vertigo 11/16/2022   Lumbar burst fracture (HCC) 08/23/2022   Lumbar vertebral fracture (HCC) 08/19/2022   Chronic migraine w/o aura w/o status migrainosus, not intractable 03/29/2022   Mixed hyperlipidemia 12/27/2021   Chest pain of uncertain etiology 12/27/2021   New onset headache 12/22/2021   S/P lumbar fusion 07/30/2021   Primary localized osteoarthritis of left knee 08/11/2020   Atrophic vaginitis 08/11/2020   Menopausal symptom 08/11/2020   Restless leg syndrome    Chronic back pain    Low back pain without  sciatica 11/01/2018   Essential hypertension    SBO (small bowel obstruction) (HCC) 09/01/2016   Fibromyalgia    Abdominal pain, vomiting, and diarrhea    Dehydration    Somnolence, daytime 10/22/2015   Fatigue 10/22/2015   Chronic leg pain 06/29/2015   S/P lumbar spinal fusion 06/18/2014   Abnormality of gait 02/18/2013   S/P lumbar microdiscectomy 01/24/2013   Hypokalemia 12/24/2012   Sweating abnormality 10/19/2012   Dysautonomia orthostatic hypotension syndrome 08/16/2012   Hereditary and idiopathic peripheral neuropathy 08/16/2012   Chronic adrenal insufficiency (HCC) 08/16/2012   MS (multiple sclerosis) (HCC) 08/15/2012    ONSET DATE: 08-17-22  REFERRING DIAG: G35 (ICD-10-CM) - Multiple sclerosis   Diagnosis  S32.039D (ICD-10-CM) - Closed fracture of third lumbar vertebra with routine healing, unspecified fracture morphology, subsequent encounter    THERAPY DIAG:  Other abnormalities of gait and mobility  Unsteadiness on feet  Muscle weakness (generalized)  Difficulty in walking, not elsewhere classified  MS (multiple sclerosis) (HCC)  Abnormality of gait  Rationale for Evaluation and Treatment: Rehabilitation  SUBJECTIVE:                                                                                                                                                                                             SUBJECTIVE STATEMENT:  Pt reports after hip stretching last visit she felt like it was easier to get in/out of car. Pt did work on new stretches given but then she had a fall.  Pt reports she tripped over her cane, it was leaning up against a chair, fell and bruised her R lower back, this happened on Sunday.  Had urology appointment yesterday, has to schedule a follow-up.  Pt accompanied by: self  PERTINENT HISTORY: spinal cord stimulator implant; MS diagnosed in 1984; s/p 2 back surgeries  Per Chart note "Brief HPI:   Katrina Rivera is a 77 y.o.  female with a history of relapsing remitting multiple sclerosis who was evaluated by Dr. Yetta Barre regarding gait abnormality, chronic low back pain and status post lumbar fusion May 2024. She has reportedly fallen 4 times at home after PLIF of L3-5. She was admitted 08/17/2022 for scheduled surgery of L3 spinal fracture. She underwent posterior fixation L2-L4 using NuVasive cortical pedicle screws, arthrodesis and removal of segmental fixation L3-L5 by Dr. Yetta Barre. PT OT evaluations carried out. Incision is healed without signs of infection. She is appropriate back soreness. She has no complaints of leg pain or new numbness, tingling or weakness per Dr. Barnett Applebaum note today. Signs are stable and she is tolerating regular diet. Other past medical history includes restless leg syndrome, anxiety, headaches with migraine features, fibromyalgia, hypotension, anemia (acute on chronic)."    PAIN:  Are you having pain? Yes: NPRS scale: 5/10 Pain location: low back pain into legs)  Pain description: tightness, throbbing, sore Aggravating factors: no specific Relieving factors: rest (TLSO has been discontinued by MD)   PRECAUTIONS: Back  - No bending, lifting >10#, twisting, reaching overhead; Dr Yetta Barre responded with "regular back precautions" via inbasket message inquiring about precautions  RED FLAGS: None   WEIGHT BEARING RESTRICTIONS: No  FALLS: Has patient fallen in last 6 months? Yes. Number of falls 4  LIVING ENVIRONMENT: Lives with: lives with their spouse Lives in: House/apartment Stairs: Yes: External: 1 steps; planning on having rail installed Has following equipment at home: Walker - 2 wheeled, shower chair, and Grab bars  PLOF: Independent with household mobility with device, Requires assistive device for independence, and Needs assistance with ADLs  PATIENT GOALS: "walk without walker and back brace and get back to where I was before"  OBJECTIVE:   DIAGNOSTIC FINDINGS: IMPRESSION: 1.  Unhealed fracture of the posterosuperior L3 body, ans associated avulsed appearance of both L3 pedicles (series 7, image 30) along the course of the bilateral L3 pedicle screws. Mild retropulsion of the posterior body fragment does not result in significant spinal stenosis. 2. No other acute osseous abnormality identified in the Lumbar Spine. But scant arthrodesis limited to only the right L5-S1 posterior elements following L3 through S1 fusion. Some endplate subsidence at each level. 3.  Aortic Atherosclerosis (ICD10-I70.0).  Nephrolithiasis.  COGNITION: Overall cognitive status: Within functional limits for tasks assessed   SENSATION: Impaired sensation in arms and legs - numbness and tingling  COORDINATION: WFL's - slowed movement due to back pain  POSTURE: rounded shoulders and forward head  LOWER EXTREMITY ROM:   WFL's bil. LE's   LOWER EXTREMITY MMT:  grossly 4/5 throughout but pain with resistance due to back pain (surgical site)  BED MOBILITY:  Independent   TRANSFERS: Assistive device utilized: Environmental consultant - 4 wheeled  Sit to stand: Modified independence Stand to sit: Modified independence  STAIRS:  TBA  GAIT: Gait pattern:  increased knee flexion bil. LE's in stance  and step through pattern Distance walked: 69' Assistive device utilized: Environmental consultant - 2 wheeled Level of assistance: SBA Comments: pt amb. With bil. Knees flexed  FUNCTIONAL TESTS:  5 times sit to stand: 21.47 secs from chair without UE support with close CGA Timed up and go (TUG): 21.47 secs with RW 10 meter walk test: 21.56 = 1.52 ft/sec with RW Berg Balance Scale: to  be assessed  - pt stood for 2" without UE support with SBA  PATIENT SURVEYS:  N/A - dx is MS  TODAY'S TREATMENT:   TherEx Standing runners stretch at steps 5 x 30 sec each B Long-sitting butterfly stretch 3 x 30 sec each B  Added butterfly stretch to HEP, see bolded below   NMR In // bars with BUE support to work on dynamic  balance and LE strengthening: Resisted gait forwards 4 x 10 ft with blue band Unresisted gait backwards 4 x 10 ft   Gait Gait pattern: decreased step length- Right, decreased step length- Left, decreased hip/knee flexion- Right, decreased hip/knee flexion- Left, and narrow BOS Distance walked: 230 ft Assistive device utilized: Single point cane Level of assistance: CGA Comments: trial gait with SPC, good balance overall  Pt asking if she needs to use a SPC vs her hurrycane due to larger base on hurrycane that she tripped over. Discussed safety and environmental awareness vs types of cane used. Although pt does exhibit good balance with use of SPC and with hurrycane she does not necessarily need to purchase a SPC at this time, just needs to be more aware of her environment and obstacles.   PATIENT EDUCATION:  Education details: continue HEP + added to HEP, use cane indoors at home and RW for community mobility Person educated: Patient Education method: Explanation, Demonstration, Tactile cues, and Verbal cues Education comprehension: verbalized understanding, returned demonstration, verbal cues required, tactile cues required, and needs further education  HOME EXERCISE PROGRAM:  Access Code: [email or web address] (combined into one HEP 12/16/22) URL: [email or web address] Date: 12/16/2022 Prepared by: Peter Congo  Exercises - Ankle Dorsiflexion with Resistance  - 1 x daily - 7 x weekly - 3 sets - 10 reps - Gastroc Stretch on Wall  - 1 x daily - 7 x weekly - 3 sets - 10 reps - Supine Knee Extension Stretch on Towel Roll  - 1 x daily - 7 x weekly - Supine Quad Set  - 1 x daily - 7 x weekly - 3 sets - 10 reps - Sit to Stand with Arms Crossed  - 1 x daily - 7 x weekly - 3 sets - 10 reps - Supine Bridge  - 1 x daily - 7 x weekly - 3 sets - 10 reps - Side Stepping with Resistance at Ankles and Counter Support  - 1 x daily - 7 x weekly - 3 sets - 10 reps - Supine Hamstring Stretch  - 1  x daily - 7 x weekly - 1 sets - 2-3 reps - 60 sec hold - Single Leg Stance with Support  - 1 x daily - 7 x weekly - 1 sets - 2-3 reps - 10 sec hold - Alternating Step Forward with Support  - 1 x daily - 7 x weekly - 1 sets - 10 reps - Step Sideways  - 1 x daily - 7 x weekly - 1 sets - 10 reps - Alternating Step Backward with Support  - 1 x daily - 7 x weekly - 1 sets - 10 reps - Side Stepping with Counter Support  - 1 x daily - 7 x weekly - 1 sets - 2-3 reps - Standing March with Counter Support  - 1 x daily - 7 x weekly - 1 sets - 10 reps - Standing Hip Extension with Counter Support  - 1 x daily - 7 x weekly - 3 sets - 10 reps - Standing Knee Flexion with  Counter Support  - 1 x daily - 7 x weekly - 3 sets - 10 reps - Heel Raises with Counter Support  - 1 x daily - 7 x weekly - 3 sets - 10 reps - Standing Hip Abduction with Counter Support  - 1 x daily - 7 x weekly - 3 sets - 10 reps - Supine March  - 1 x daily - 7 x weekly - 3 sets - 10 reps - Supine Piriformis Stretch with Leg Straight  - 1 x daily - 7 x weekly - 1 sets - 3-5 reps - 30 sec hold - Supine Butterfly Groin Stretch  - 1 x daily - 7 x weekly - 1 sets - 3-5 reps - 30 sec hold   GOALS: Goals reviewed with patient? Yes   UPDATED LONG TERM GOALS: Target date: 02-17-23  (some initial LTG's revised)   Improve Berg score  >/= 48/56 to reduce fall risk & demo improved balance. Baseline: 30/56 (8/19); score 41/56 on 11-21-22, 45/56 (12/16/22); score 43/56 on 01-10-23 Goal status: Ongoing  2.  Pt will be modified independent with household ambulation without device. Baseline: pt reports she is walking short distances in home without use of RW, but still using RW for longer distances in the home Goal status: Ongoing  3.  Improve 5x sit to stand score to </= 15 secs from chair without UE support without LOB to demo improved LE strength.  Baseline: 21.47 secs;  19.68 secs:   01-10-23   24.10 secs;   01-12-23 17.78 Goal status:  Revised  4.  Improve TUG score to </= 17 secs with RW to demo improved functional mobility.  Baseline: 21.47 secs with RW;  21.12 secs:   20.41 secs,  18.81 secs without RW;   26.19 secs with RW (01-10-23);   21.56 secs with RW, 19.19 secs without RW Goal status:  Revised  5.   Increase gait speed to >/= 2.3 ft/sec with RW with pt demonstrating bil. Knee extension in stance.  Baseline: 1.52 ft/sec with RW (21.56 secs); 19.62,  20.00 = 1.67 ft/sec with RW;    01-12-23:  23.65, 23.19 secs = 1.41 ft/sec with RW Goal status: Revised  6.  Independent in updated HEP for balance and LE strengthening and core stabilization exercises. Baseline:  Goal status: NEW  7.  Pt will transfer floor to stand with UE support on mat table with SBA.  Baseline:  TBA  Goal status:  ONGOING   ASSESSMENT:  CLINICAL IMPRESSION:  Emphasis of skilled PT session on assessing gait with SPC per patient request, continuing to work on hip stretches, and working on dynamic balance and LE strengthening. Pt continues to need to pay more attention to her environment and to obstacles in order to avoid a fall. She continues to benefit from stretching her hips for improved functional mobility. She also continues to benefit on working on her static and dynamic balance in order to decrease her fall risk. Continue POC.   OBJECTIVE IMPAIRMENTS: Abnormal gait, decreased activity tolerance, decreased balance, decreased strength, and pain.   ACTIVITY LIMITATIONS: carrying, lifting, bending, squatting, stairs, transfers, reach over head, and locomotion level  PARTICIPATION LIMITATIONS: meal prep, cleaning, laundry, shopping, community activity, and pt reports she does not drive  PERSONAL FACTORS: Past/current experiences, Time since onset of injury/illness/exacerbation, and 1 comorbidity: MS and s/p back surgery  are also affecting patient's functional outcome.   REHAB POTENTIAL: Good  CLINICAL DECISION MAKING: Evolving/moderate  complexity  EVALUATION  COMPLEXITY: Moderate  PLAN:  PT FREQUENCY: 2x/week  PT DURATION: 4 weeks (8 additional visits)  PLANNED INTERVENTIONS: Therapeutic exercises, Therapeutic activity, Neuromuscular re-education, Balance training, Gait training, Patient/Family education, Self Care, Stair training, DME instructions, and Aquatic Therapy  PLAN FOR NEXT SESSION: Cont balance & LE strengthening Cont gait training with SPC with rubber quad base, balance training, floor transfer practice, single leg stance, stance progressions, LE strengthening, endurance, balance on compliant surfaces, hip abduction and flexion strengthening, countertop balance, resisted step taps, hip ROM and strengthening    Peter Congo, PT Peter Congo, PT, DPT, CSRS  01/24/2023, 11:03 AM   [facility] [address] [place], [place] [address] [phone]

## 2023-01-25 ENCOUNTER — Ambulatory Visit: Payer: PPO | Admitting: Endocrinology

## 2023-01-26 ENCOUNTER — Ambulatory Visit: Payer: PPO | Admitting: Physical Therapy

## 2023-01-26 ENCOUNTER — Encounter: Payer: Self-pay | Admitting: Physical Therapy

## 2023-01-26 DIAGNOSIS — R2689 Other abnormalities of gait and mobility: Secondary | ICD-10-CM

## 2023-01-26 DIAGNOSIS — R2681 Unsteadiness on feet: Secondary | ICD-10-CM

## 2023-01-26 DIAGNOSIS — M6281 Muscle weakness (generalized): Secondary | ICD-10-CM

## 2023-01-26 DIAGNOSIS — R29818 Other symptoms and signs involving the nervous system: Secondary | ICD-10-CM

## 2023-01-26 NOTE — Therapy (Signed)
OUTPATIENT PHYSICAL THERAPY NEURO TREATMENT NOTE    Patient Name: BRISHA SHEEDER MRN: [id number] DOB:1945-06-04, 77 y.o., female Today's Date: 01/27/2023    PCP: Noberto Retort, MD REFERRING PROVIDER: Charlton Amor, PA-C  END OF SESSION:  PT End of Session - 01/26/23 2030     Visit Number 26    Number of Visits 30    Date for PT Re-Evaluation 02/10/23    Authorization Type [employer]    Authorization Time Period 09-20-22 - 12-08-22; 11-21-22 - 02-07-23;  01-12-23 - 03-10-23    Progress Note Due on Visit 30    PT Start Time 1102    PT Stop Time 1146    PT Time Calculation (min) 44 min    Equipment Utilized During Treatment Gait belt    Activity Tolerance Patient tolerated treatment well    Behavior During Therapy WFL for tasks assessed/performed                           Past Medical History:  Diagnosis Date   Addison's disease (HCC)    takes Solu Cortef daily   Anemia    takes Ferrous Sulfate daily   Anxiety    takes Xanax nightly   Arthritis    Chronic back pain    stenosis   Depression    takes Cymbalta daily   Dizziness    if b/p drops    Fibromyalgia    History of blood transfusion    no abnormal reaction noted   History of bronchitis    many yrs ago    Hypokalemia    takes Potassium daily   Hypotension    takes Florinef daily   IBS (irritable bowel syndrome)    takes Align daily   Insomnia    takes Trazodone nightly   Joint pain    Multiple sclerosis (HCC)    doesn't take any meds   Multiple sclerosis (HCC)    New onset headache 12/22/2021   Nocturia    Osteoporosis    Palpitations    Peripheral neuropathy    Primary localized osteoarthritis of left knee 08/11/2020   Restless leg syndrome    Seasonal allergies    takes Zyrtec daily;uses Flonase daily as needed   Syncope    when missed methylprednisolone dose   Urinary frequency    takes Flomax daily   Weakness    numbness and tingling   Past Surgical  History:  Procedure Laterality Date   ABDOMINAL HYSTERECTOMY  02/07/1986   APPENDECTOMY  02/07/1986   CESAREAN SECTION  1973/1977   x2   CHOLECYSTECTOMY  02/08/1995   COLECTOMY  02/08/1988   COLONOSCOPY     ESOPHAGOGASTRODUODENOSCOPY     EYE SURGERY     bilateral - /w IOL- cataracts   FRACTURE SURGERY Right    rods and screws-right leg   LUMBAR LAMINECTOMY/DECOMPRESSION MICRODISCECTOMY Left 01/24/2013   Procedure: LUMBAR FIVE TO SACRAL ONE LUMBAR LAMINECTOMY/DECOMPRESSION MICRODISCECTOMY 1 LEVEL;  Surgeon: Tia Alert, MD;  Location: [facility];  Service: Neurosurgery;  Laterality: Left;   MAXIMUM ACCESS (MAS)POSTERIOR LUMBAR INTERBODY FUSION (PLIF) 1 LEVEL N/A 06/18/2014   Procedure: MAXIMUM ACCESS SURGERY POSTERIOR LUMBAR INTERBODY FUSION LUMBAR FIVE TO SACRAL ONE ;  Surgeon: Tia Alert, MD;  Location: [facility];  Service: Neurosurgery;  Laterality: N/A;   SPINAL CORD STIMULATOR BATTERY EXCHANGE Right 07/30/2021   Procedure: Spinal cord stimulator battery replacement;  Surgeon: Marikay Alar  S, MD;  Location: [facility];  Service: Neurosurgery;  Laterality: Right;   SPINAL CORD STIMULATOR IMPLANT     TOTAL KNEE ARTHROPLASTY Left 08/24/2020   Procedure: TOTAL KNEE ARTHROPLASTY;  Surgeon: Salvatore Marvel, MD;  Location: [facility];  Service: Orthopedics;  Laterality: Left;   Patient Active Problem List   Diagnosis Date Noted   Myofascial pain dysfunction syndrome 11/16/2022   Vertigo 11/16/2022   Lumbar burst fracture (HCC) 08/23/2022   Lumbar vertebral fracture (HCC) 08/19/2022   Chronic migraine w/o aura w/o status migrainosus, not intractable 03/29/2022   Mixed hyperlipidemia 12/27/2021   Chest pain of uncertain etiology 12/27/2021   New onset headache 12/22/2021   S/P lumbar fusion 07/30/2021   Primary localized osteoarthritis of left knee 08/11/2020   Atrophic vaginitis 08/11/2020   Menopausal symptom 08/11/2020   Restless leg syndrome    Chronic back pain    Low back pain  without sciatica 11/01/2018   Essential hypertension    SBO (small bowel obstruction) (HCC) 09/01/2016   Fibromyalgia    Abdominal pain, vomiting, and diarrhea    Dehydration    Somnolence, daytime 10/22/2015   Fatigue 10/22/2015   Chronic leg pain 06/29/2015   S/P lumbar spinal fusion 06/18/2014   Abnormality of gait 02/18/2013   S/P lumbar microdiscectomy 01/24/2013   Hypokalemia 12/24/2012   Sweating abnormality 10/19/2012   Dysautonomia orthostatic hypotension syndrome 08/16/2012   Hereditary and idiopathic peripheral neuropathy 08/16/2012   Chronic adrenal insufficiency (HCC) 08/16/2012   MS (multiple sclerosis) (HCC) 08/15/2012    ONSET DATE: 08-17-22  REFERRING DIAG: G35 (ICD-10-CM) - Multiple sclerosis   Diagnosis  S32.039D (ICD-10-CM) - Closed fracture of third lumbar vertebra with routine healing, unspecified fracture morphology, subsequent encounter    THERAPY DIAG:  Other abnormalities of gait and mobility  Unsteadiness on feet  Muscle weakness (generalized)  Other symptoms and signs involving the nervous system  Rationale for Evaluation and Treatment: Rehabilitation  SUBJECTIVE:                                                                                                                                                                                             SUBJECTIVE STATEMENT:  Pt reports she has been using RW in the home since the fall she had on Sunday when she reached for the cane.  Pt reports the stretches done in previous PT session were very helpful - was able to get in and out of car easier.  Doesn't understand why her legs are so tight.   Pt accompanied by: self  PERTINENT HISTORY: spinal cord stimulator implant; MS diagnosed in 1984; s/p  2 back surgeries  Per Chart note "Brief HPI:   MARKEYA BOAG is a 77 y.o. female with a history of relapsing remitting multiple sclerosis who was evaluated by Dr. Yetta Barre regarding gait abnormality, chronic  low back pain and status post lumbar fusion May 2024. She has reportedly fallen 4 times at home after PLIF of L3-5. She was admitted 08/17/2022 for scheduled surgery of L3 spinal fracture. She underwent posterior fixation L2-L4 using NuVasive cortical pedicle screws, arthrodesis and removal of segmental fixation L3-L5 by Dr. Yetta Barre. PT OT evaluations carried out. Incision is healed without signs of infection. She is appropriate back soreness. She has no complaints of leg pain or new numbness, tingling or weakness per Dr. Barnett Applebaum note today. Signs are stable and she is tolerating regular diet. Other past medical history includes restless leg syndrome, anxiety, headaches with migraine features, fibromyalgia, hypotension, anemia (acute on chronic)."    PAIN:  Are you having pain? Yes: NPRS scale: 6/10 Pain location: low back pain into legs)  Pain description: tightness, throbbing, sore Aggravating factors: no specific Relieving factors: rest (TLSO has been discontinued by MD)   PRECAUTIONS: Back  - No bending, lifting >10#, twisting, reaching overhead; Dr Yetta Barre responded with "regular back precautions" via inbasket message inquiring about precautions  RED FLAGS: None   WEIGHT BEARING RESTRICTIONS: No  FALLS: Has patient fallen in last 6 months? Yes. Number of falls 4  LIVING ENVIRONMENT: Lives with: lives with their spouse Lives in: House/apartment Stairs: Yes: External: 1 steps; planning on having rail installed Has following equipment at home: Walker - 2 wheeled, shower chair, and Grab bars  PLOF: Independent with household mobility with device, Requires assistive device for independence, and Needs assistance with ADLs  PATIENT GOALS: "walk without walker and back brace and get back to where I was before"  OBJECTIVE:   DIAGNOSTIC FINDINGS: IMPRESSION: 1. Unhealed fracture of the posterosuperior L3 body, ans associated avulsed appearance of both L3 pedicles (series 7, image 30)  along the course of the bilateral L3 pedicle screws. Mild retropulsion of the posterior body fragment does not result in significant spinal stenosis. 2. No other acute osseous abnormality identified in the Lumbar Spine. But scant arthrodesis limited to only the right L5-S1 posterior elements following L3 through S1 fusion. Some endplate subsidence at each level. 3.  Aortic Atherosclerosis (ICD10-I70.0).  Nephrolithiasis.  COGNITION: Overall cognitive status: Within functional limits for tasks assessed   SENSATION: Impaired sensation in arms and legs - numbness and tingling  COORDINATION: WFL's - slowed movement due to back pain  POSTURE: rounded shoulders and forward head  LOWER EXTREMITY ROM:   WFL's bil. LE's   LOWER EXTREMITY MMT:  grossly 4/5 throughout but pain with resistance due to back pain (surgical site)  BED MOBILITY:  Independent   TRANSFERS: Assistive device utilized: Environmental consultant - 4 wheeled  Sit to stand: Modified independence Stand to sit: Modified independence  STAIRS:  TBA  GAIT: Gait pattern:  increased knee flexion bil. LE's in stance  and step through pattern Distance walked: 51' Assistive device utilized: Environmental consultant - 2 wheeled Level of assistance: SBA Comments: pt amb. With bil. Knees flexed  FUNCTIONAL TESTS:  5 times sit to stand: 21.47 secs from chair without UE support with close CGA Timed up and go (TUG): 21.47 secs with RW 10 meter walk test: 21.56 = 1.52 ft/sec with RW Berg Balance Scale: to be assessed  - pt stood for 2" without UE support with SBA  PATIENT SURVEYS:  N/A - dx is MS  TODAY'S TREATMENT:   TherEx: Bil. LE stretches - reviewed stretches issued in previous session Supine butterfly adductor stretch - 2 reps - 15 sec hold  Added following stretches:  Medbridge  Access Code: [email or web address] URL: [email or web address] Date: 01/27/2023 Prepared by: Maebelle Munroe  Exercises - Single Knee to Chest Stretch  - 1 x daily  - 7 x weekly - 1 sets - 1-2 reps - 15-20 hold - Supine Double Knee to Chest  - 1 x daily - 7 x weekly - 1 sets - 2 reps - 15-20 sec hold - Supine Lower Trunk Rotation  - 1 x daily - 7 x weekly - 1 sets - 2 reps - 15-20 sec hold  NeuroRe-ed: SLS activities: touching 3 balance bubbles with UE support on back of chair prn - with min to mod assist for balance recovery Tap ups to 1st step with UE support 10 reps each - with CGA to min assist for balance recovery Sit to stand from mat 5 reps without UE support   Gait Gait pattern: decreased step length- Right, decreased step length- Left, decreased hip/knee flexion- Right, decreased hip/knee flexion- Left, and narrow BOS Distance walked: 115' x 2  Assistive device utilized: Single point cane 1st lap;  no device 2nd lap at end of session - CGA only - to work on dynamic standing balance Level of assistance: CGA Comments: SPC with rubber quad base; no device  Recommended to pt that she continue to use only RW in home for assistance with ambulation due to increased falls within past 6 weeks   PATIENT EDUCATION:  Education details: continue HEP + added to HEP, use cane indoors at home and RW for community mobility Person educated: Patient Education method: Programmer, multimedia, Demonstration, Tactile cues, and Verbal cues Education comprehension: verbalized understanding, returned demonstration, verbal cues required, tactile cues required, and needs further education  HOME EXERCISE PROGRAM:  Access Code: [email or web address] (combined into one HEP 12/16/22) URL: [email or web address] Date: 12/16/2022 Prepared by: Peter Congo  Exercises - Ankle Dorsiflexion with Resistance  - 1 x daily - 7 x weekly - 3 sets - 10 reps - Gastroc Stretch on Wall  - 1 x daily - 7 x weekly - 3 sets - 10 reps - Supine Knee Extension Stretch on Towel Roll  - 1 x daily - 7 x weekly - Supine Quad Set  - 1 x daily - 7 x weekly - 3 sets - 10 reps - Sit to Stand with Arms  Crossed  - 1 x daily - 7 x weekly - 3 sets - 10 reps - Supine Bridge  - 1 x daily - 7 x weekly - 3 sets - 10 reps - Side Stepping with Resistance at Ankles and Counter Support  - 1 x daily - 7 x weekly - 3 sets - 10 reps - Supine Hamstring Stretch  - 1 x daily - 7 x weekly - 1 sets - 2-3 reps - 60 sec hold - Single Leg Stance with Support  - 1 x daily - 7 x weekly - 1 sets - 2-3 reps - 10 sec hold - Alternating Step Forward with Support  - 1 x daily - 7 x weekly - 1 sets - 10 reps - Step Sideways  - 1 x daily - 7 x weekly - 1 sets - 10 reps - Alternating Step Backward with Support  - 1 x daily - 7 x weekly - 1 sets - 10 reps -  Side Stepping with Counter Support  - 1 x daily - 7 x weekly - 1 sets - 2-3 reps - Standing March with Counter Support  - 1 x daily - 7 x weekly - 1 sets - 10 reps - Standing Hip Extension with Counter Support  - 1 x daily - 7 x weekly - 3 sets - 10 reps - Standing Knee Flexion with Counter Support  - 1 x daily - 7 x weekly - 3 sets - 10 reps - Heel Raises with Counter Support  - 1 x daily - 7 x weekly - 3 sets - 10 reps - Standing Hip Abduction with Counter Support  - 1 x daily - 7 x weekly - 3 sets - 10 reps - Supine March  - 1 x daily - 7 x weekly - 3 sets - 10 reps - Supine Piriformis Stretch with Leg Straight  - 1 x daily - 7 x weekly - 1 sets - 3-5 reps - 30 sec hold - Supine Butterfly Groin Stretch  - 1 x daily - 7 x weekly - 1 sets - 3-5 reps - 30 sec hold   GOALS: Goals reviewed with patient? Yes   UPDATED LONG TERM GOALS: Target date: 02-17-23  (some initial LTG's revised)   Improve Berg score  >/= 48/56 to reduce fall risk & demo improved balance. Baseline: 30/56 (8/19); score 41/56 on 11-21-22, 45/56 (12/16/22); score 43/56 on 01-10-23 Goal status: Ongoing  2.  Pt will be modified independent with household ambulation without device. Baseline: pt reports she is walking short distances in home without use of RW, but still using RW for longer distances in  the home Goal status: Ongoing  3.  Improve 5x sit to stand score to </= 15 secs from chair without UE support without LOB to demo improved LE strength.  Baseline: 21.47 secs;  19.68 secs:   01-10-23   24.10 secs;   01-12-23 17.78 Goal status: Revised  4.  Improve TUG score to </= 17 secs with RW to demo improved functional mobility.  Baseline: 21.47 secs with RW;  21.12 secs:   20.41 secs,  18.81 secs without RW;   26.19 secs with RW (01-10-23);   21.56 secs with RW, 19.19 secs without RW Goal status:  Revised  5.   Increase gait speed to >/= 2.3 ft/sec with RW with pt demonstrating bil. Knee extension in stance.  Baseline: 1.52 ft/sec with RW (21.56 secs); 19.62,  20.00 = 1.67 ft/sec with RW;    01-12-23:  23.65, 23.19 secs = 1.41 ft/sec with RW Goal status: Revised  6.  Independent in updated HEP for balance and LE strengthening and core stabilization exercises. Baseline:  Goal status: NEW  7.  Pt will transfer floor to stand with UE support on mat table with SBA.  Baseline:  TBA  Goal status:  ONGOING   ASSESSMENT:  CLINICAL IMPRESSION:  PT session focused on LE stretching with pt reporting that stretches issued in previous PT session were very beneficial and helped to increase mobility, with increased ease getting in and out of car.  Added knee to chest and trunk rotation to HEP for additional low back stretching.  Remainder of session focused on gait training with pt using SPC with rubber quad base for 1 lap around track, with no LOB and then no device for 1 lap with CGA.  Pt's decreased attention to task appears to contribute to her falls - pt was instructed to continue to  use RW with household ambulation to reduce fall risk, due to recent number of recurrent falls.  Pt verbalized understanding and agreement.  Continue POC.   OBJECTIVE IMPAIRMENTS: Abnormal gait, decreased activity tolerance, decreased balance, decreased strength, and pain.   ACTIVITY LIMITATIONS: carrying, lifting,  bending, squatting, stairs, transfers, reach over head, and locomotion level  PARTICIPATION LIMITATIONS: meal prep, cleaning, laundry, shopping, community activity, and pt reports she does not drive  PERSONAL FACTORS: Past/current experiences, Time since onset of injury/illness/exacerbation, and 1 comorbidity: MS and s/p back surgery  are also affecting patient's functional outcome.   REHAB POTENTIAL: Good  CLINICAL DECISION MAKING: Evolving/moderate complexity  EVALUATION COMPLEXITY: Moderate  PLAN:  PT FREQUENCY: 2x/week  PT DURATION: 4 weeks (8 additional visits)  PLANNED INTERVENTIONS: Therapeutic exercises, Therapeutic activity, Neuromuscular re-education, Balance training, Gait training, Patient/Family education, Self Care, Stair training, DME instructions, and Aquatic Therapy  PLAN FOR NEXT SESSION: Cont balance & LE strengthening Cont gait training with SPC with rubber quad base, balance training, floor transfer practice, single leg stance, stance progressions, LE strengthening, endurance, balance on compliant surfaces, hip abduction and flexion strengthening, countertop balance, resisted step taps, hip ROM and strengthening    Krystofer Hevener, Donavan Burnet, PT   01/27/2023, 1:03 PM   [facility] [address], [address] [place], [place] [address] [phone]

## 2023-01-31 ENCOUNTER — Ambulatory Visit: Payer: PPO | Admitting: Physical Therapy

## 2023-01-31 DIAGNOSIS — R2689 Other abnormalities of gait and mobility: Secondary | ICD-10-CM

## 2023-01-31 DIAGNOSIS — M6281 Muscle weakness (generalized): Secondary | ICD-10-CM

## 2023-01-31 DIAGNOSIS — G35 Multiple sclerosis: Secondary | ICD-10-CM

## 2023-01-31 DIAGNOSIS — R269 Unspecified abnormalities of gait and mobility: Secondary | ICD-10-CM

## 2023-01-31 DIAGNOSIS — R2681 Unsteadiness on feet: Secondary | ICD-10-CM

## 2023-01-31 DIAGNOSIS — R262 Difficulty in walking, not elsewhere classified: Secondary | ICD-10-CM

## 2023-01-31 NOTE — Therapy (Signed)
OUTPATIENT PHYSICAL THERAPY NEURO TREATMENT NOTE    Patient Name: Katrina Rivera MRN: [id number] DOB:30-Dec-1945, 77 y.o., female Today's Date: 01/31/2023    PCP: Noberto Retort, MD REFERRING PROVIDER: Charlton Amor, PA-C  END OF SESSION:  PT End of Session - 01/31/23 0933     Visit Number 27    Number of Visits 30    Date for PT Re-Evaluation 02/10/23    Authorization Type [employer]    Authorization Time Period 09-20-22 - 12-08-22; 11-21-22 - 02-07-23;  01-12-23 - 03-10-23    Progress Note Due on Visit 30    PT Start Time 0930    PT Stop Time 1015    PT Time Calculation (min) 45 min    Equipment Utilized During Treatment Gait belt    Activity Tolerance Patient tolerated treatment well    Behavior During Therapy WFL for tasks assessed/performed                            Past Medical History:  Diagnosis Date   Addison's disease (HCC)    takes Solu Cortef daily   Anemia    takes Ferrous Sulfate daily   Anxiety    takes Xanax nightly   Arthritis    Chronic back pain    stenosis   Depression    takes Cymbalta daily   Dizziness    if b/p drops    Fibromyalgia    History of blood transfusion    no abnormal reaction noted   History of bronchitis    many yrs ago    Hypokalemia    takes Potassium daily   Hypotension    takes Florinef daily   IBS (irritable bowel syndrome)    takes Align daily   Insomnia    takes Trazodone nightly   Joint pain    Multiple sclerosis (HCC)    doesn't take any meds   Multiple sclerosis (HCC)    New onset headache 12/22/2021   Nocturia    Osteoporosis    Palpitations    Peripheral neuropathy    Primary localized osteoarthritis of left knee 08/11/2020   Restless leg syndrome    Seasonal allergies    takes Zyrtec daily;uses Flonase daily as needed   Syncope    when missed methylprednisolone dose   Urinary frequency    takes Flomax daily   Weakness    numbness and tingling   Past Surgical  History:  Procedure Laterality Date   ABDOMINAL HYSTERECTOMY  02/07/1986   APPENDECTOMY  02/07/1986   CESAREAN SECTION  1973/1977   x2   CHOLECYSTECTOMY  02/08/1995   COLECTOMY  02/08/1988   COLONOSCOPY     ESOPHAGOGASTRODUODENOSCOPY     EYE SURGERY     bilateral - /w IOL- cataracts   FRACTURE SURGERY Right    rods and screws-right leg   LUMBAR LAMINECTOMY/DECOMPRESSION MICRODISCECTOMY Left 01/24/2013   Procedure: LUMBAR FIVE TO SACRAL ONE LUMBAR LAMINECTOMY/DECOMPRESSION MICRODISCECTOMY 1 LEVEL;  Surgeon: Tia Alert, MD;  Location: [facility];  Service: Neurosurgery;  Laterality: Left;   MAXIMUM ACCESS (MAS)POSTERIOR LUMBAR INTERBODY FUSION (PLIF) 1 LEVEL N/A 06/18/2014   Procedure: MAXIMUM ACCESS SURGERY POSTERIOR LUMBAR INTERBODY FUSION LUMBAR FIVE TO SACRAL ONE ;  Surgeon: Tia Alert, MD;  Location: [facility];  Service: Neurosurgery;  Laterality: N/A;   SPINAL CORD STIMULATOR BATTERY EXCHANGE Right 07/30/2021   Procedure: Spinal cord stimulator battery replacement;  Surgeon: Yetta Barre,  Kermit Balo, MD;  [facility];  Service: Neurosurgery;  Laterality: Right;   SPINAL CORD STIMULATOR IMPLANT     TOTAL KNEE ARTHROPLASTY Left 08/24/2020   Procedure: TOTAL KNEE ARTHROPLASTY;  Surgeon: Salvatore Marvel, MD;  Location: [facility];  Service: Orthopedics;  Laterality: Left;   Patient Active Problem List   Diagnosis Date Noted   Myofascial pain dysfunction syndrome 11/16/2022   Vertigo 11/16/2022   Lumbar burst fracture (HCC) 08/23/2022   Lumbar vertebral fracture (HCC) 08/19/2022   Chronic migraine w/o aura w/o status migrainosus, not intractable 03/29/2022   Mixed hyperlipidemia 12/27/2021   Chest pain of uncertain etiology 12/27/2021   New onset headache 12/22/2021   S/P lumbar fusion 07/30/2021   Primary localized osteoarthritis of left knee 08/11/2020   Atrophic vaginitis 08/11/2020   Menopausal symptom 08/11/2020   Restless leg syndrome    Chronic back pain    Low back pain  without sciatica 11/01/2018   Essential hypertension    SBO (small bowel obstruction) (HCC) 09/01/2016   Fibromyalgia    Abdominal pain, vomiting, and diarrhea    Dehydration    Somnolence, daytime 10/22/2015   Fatigue 10/22/2015   Chronic leg pain 06/29/2015   S/P lumbar spinal fusion 06/18/2014   Abnormality of gait 02/18/2013   S/P lumbar microdiscectomy 01/24/2013   Hypokalemia 12/24/2012   Sweating abnormality 10/19/2012   Dysautonomia orthostatic hypotension syndrome 08/16/2012   Hereditary and idiopathic peripheral neuropathy 08/16/2012   Chronic adrenal insufficiency (HCC) 08/16/2012   MS (multiple sclerosis) (HCC) 08/15/2012    ONSET DATE: 08-17-22  REFERRING DIAG: G35 (ICD-10-CM) - Multiple sclerosis   Diagnosis  S32.039D (ICD-10-CM) - Closed fracture of third lumbar vertebra with routine healing, unspecified fracture morphology, subsequent encounter    THERAPY DIAG:  Other abnormalities of gait and mobility  Unsteadiness on feet  Muscle weakness (generalized)  Difficulty in walking, not elsewhere classified  MS (multiple sclerosis) (HCC)  Abnormality of gait  Rationale for Evaluation and Treatment: Rehabilitation  SUBJECTIVE:                                                                                                                                                                                             SUBJECTIVE STATEMENT:  Pt reports no falls since last visit, several near falls but she caught herself. Pt continues to use her RW. Pt reports her tremor has gotten worse as well, not scheduled to see her neurologist until April but she is on a wait-list to get in sooner.  Pt accompanied by: self  PERTINENT HISTORY: spinal cord stimulator implant; MS diagnosed in 1984; s/p 2 back  surgeries  Per Chart note "Brief HPI:   Katrina Rivera is a 77 y.o. female with a history of relapsing remitting multiple sclerosis who was evaluated by Dr. Yetta Barre  regarding gait abnormality, chronic low back pain and status post lumbar fusion May 2024. She has reportedly fallen 4 times at home after PLIF of L3-5. She was admitted 08/17/2022 for scheduled surgery of L3 spinal fracture. She underwent posterior fixation L2-L4 using NuVasive cortical pedicle screws, arthrodesis and removal of segmental fixation L3-L5 by Dr. Yetta Barre. PT OT evaluations carried out. Incision is healed without signs of infection. She is appropriate back soreness. She has no complaints of leg pain or new numbness, tingling or weakness per Dr. Barnett Applebaum note today. Signs are stable and she is tolerating regular diet. Other past medical history includes restless leg syndrome, anxiety, headaches with migraine features, fibromyalgia, hypotension, anemia (acute on chronic)."    PAIN:  Are you having pain? Yes: NPRS scale: 6/10 Pain location: low back pain into legs)  Pain description: tightness, throbbing, sore Aggravating factors: no specific Relieving factors: rest (TLSO has been discontinued by MD)   PRECAUTIONS: Back  - No bending, lifting >10#, twisting, reaching overhead; Dr Yetta Barre responded with "regular back precautions" via inbasket message inquiring about precautions  RED FLAGS: None   WEIGHT BEARING RESTRICTIONS: No  FALLS: Has patient fallen in last 6 months? Yes. Number of falls 4  LIVING ENVIRONMENT: Lives with: lives with their spouse Lives in: House/apartment Stairs: Yes: External: 1 steps; planning on having rail installed Has following equipment at home: Walker - 2 wheeled, shower chair, and Grab bars  PLOF: Independent with household mobility with device, Requires assistive device for independence, and Needs assistance with ADLs  PATIENT GOALS: "walk without walker and back brace and get back to where I was before"  OBJECTIVE:   DIAGNOSTIC FINDINGS: IMPRESSION: 1. Unhealed fracture of the posterosuperior L3 body, ans associated avulsed appearance of both L3  pedicles (series 7, image 30) along the course of the bilateral L3 pedicle screws. Mild retropulsion of the posterior body fragment does not result in significant spinal stenosis. 2. No other acute osseous abnormality identified in the Lumbar Spine. But scant arthrodesis limited to only the right L5-S1 posterior elements following L3 through S1 fusion. Some endplate subsidence at each level. 3.  Aortic Atherosclerosis (ICD10-I70.0).  Nephrolithiasis.  COGNITION: Overall cognitive status: Within functional limits for tasks assessed   SENSATION: Impaired sensation in arms and legs - numbness and tingling  COORDINATION: WFL's - slowed movement due to back pain  POSTURE: rounded shoulders and forward head  LOWER EXTREMITY ROM:   WFL's bil. LE's   LOWER EXTREMITY MMT:  grossly 4/5 throughout but pain with resistance due to back pain (surgical site)  BED MOBILITY:  Independent   TRANSFERS: Assistive device utilized: Environmental consultant - 4 wheeled  Sit to stand: Modified independence Stand to sit: Modified independence  STAIRS:  TBA  GAIT: Gait pattern:  increased knee flexion bil. LE's in stance  and step through pattern Distance walked: 52' Assistive device utilized: Environmental consultant - 2 wheeled Level of assistance: SBA Comments: pt amb. With bil. Knees flexed  FUNCTIONAL TESTS:  5 times sit to stand: 21.47 secs from chair without UE support with close CGA Timed up and go (TUG): 21.47 secs with RW 10 meter walk test: 21.56 = 1.52 ft/sec with RW Berg Balance Scale: to be assessed  - pt stood for 2" without UE support with SBA  PATIENT SURVEYS:  N/A -  dx is MS  TODAY'S TREATMENT:   NeuroRe-ed: To work on static standing balance, reaching outside BOS, environment scanning, and stepping over obstacles at ballet bar with BUE support: 5 Blaze pods on random setting. Performed on 2 minute intervals with 30 rest periods.  Pt requires CGA guarding. Round 1:  3 on mirror, 2 on floor setup.  38  hits. Round 2:  3 on mirror, 2 on floor setup.  48 hits. Round 3:  3 on mirror, 2 on floor setup.  44 hits. Notable errors/deficits:  needs cues to attend to task as pt is easily distractable 5 Blaze pods on random setting.  Performed on 1 minute intervals with 30 rest periods.  Pt requires CGA guarding. Round 1:  3 on mirror, 2 on floor setup stepping over foam blocks on floor.  12 hits. Round 2:  3 on mirror, 2 on floor setup stepping over foam blocks on floor..  16 hits. Round 3:  3 on mirror, 2 on floor setup stepping over foam blocks on floor..  15 hits. Notable errors/deficits:  knocks foam blocks over several times due to decreased step height/clearance   Resisted gait at ballet bar with blue band around waist: Forwards gait/backwards eccentric gait 5 x 5 ft each direction Most difficulty with eccentric gait backwards Backwards gait/forwards eccentric gait 5 x 5 ft each direction Lateral gait/lateral eccentric gait 5 x 5 ft L/R   PATIENT EDUCATION:  Education details: continue HEP, use RW at all times for safety Person educated: Patient Education method: Explanation, Demonstration, Tactile cues, and Verbal cues Education comprehension: verbalized understanding, returned demonstration, verbal cues required, tactile cues required, and needs further education  HOME EXERCISE PROGRAM:  Access Code: [email or web address] (combined into one HEP 12/16/22) URL: [email or web address] Date: 12/16/2022 Prepared by: Peter Congo  Exercises - Ankle Dorsiflexion with Resistance  - 1 x daily - 7 x weekly - 3 sets - 10 reps - Gastroc Stretch on Wall  - 1 x daily - 7 x weekly - 3 sets - 10 reps - Supine Knee Extension Stretch on Towel Roll  - 1 x daily - 7 x weekly - Supine Quad Set  - 1 x daily - 7 x weekly - 3 sets - 10 reps - Sit to Stand with Arms Crossed  - 1 x daily - 7 x weekly - 3 sets - 10 reps - Supine Bridge  - 1 x daily - 7 x weekly - 3 sets - 10 reps - Side Stepping with  Resistance at Ankles and Counter Support  - 1 x daily - 7 x weekly - 3 sets - 10 reps - Supine Hamstring Stretch  - 1 x daily - 7 x weekly - 1 sets - 2-3 reps - 60 sec hold - Single Leg Stance with Support  - 1 x daily - 7 x weekly - 1 sets - 2-3 reps - 10 sec hold - Alternating Step Forward with Support  - 1 x daily - 7 x weekly - 1 sets - 10 reps - Step Sideways  - 1 x daily - 7 x weekly - 1 sets - 10 reps - Alternating Step Backward with Support  - 1 x daily - 7 x weekly - 1 sets - 10 reps - Side Stepping with Counter Support  - 1 x daily - 7 x weekly - 1 sets - 2-3 reps - Standing March with Counter Support  - 1 x daily - 7 x weekly - 1 sets -  10 reps - Standing Hip Extension with Counter Support  - 1 x daily - 7 x weekly - 3 sets - 10 reps - Standing Knee Flexion with Counter Support  - 1 x daily - 7 x weekly - 3 sets - 10 reps - Heel Raises with Counter Support  - 1 x daily - 7 x weekly - 3 sets - 10 reps - Standing Hip Abduction with Counter Support  - 1 x daily - 7 x weekly - 3 sets - 10 reps - Supine March  - 1 x daily - 7 x weekly - 3 sets - 10 reps - Supine Piriformis Stretch with Leg Straight  - 1 x daily - 7 x weekly - 1 sets - 3-5 reps - 30 sec hold - Supine Butterfly Groin Stretch  - 1 x daily - 7 x weekly - 1 sets - 3-5 reps - 30 sec hold  Added following stretches:  [employer]  Access Code: [email or web address] URL: [email or web address] Date: 01/27/2023 Prepared by: Maebelle Munroe  Exercises - Single Knee to Chest Stretch  - 1 x daily - 7 x weekly - 1 sets - 1-2 reps - 15-20 hold - Supine Double Knee to Chest  - 1 x daily - 7 x weekly - 1 sets - 2 reps - 15-20 sec hold - Supine Lower Trunk Rotation  - 1 x daily - 7 x weekly - 1 sets - 2 reps - 15-20 sec hold   GOALS: Goals reviewed with patient? Yes   UPDATED LONG TERM GOALS: Target date: 02-17-23  (some initial LTG's revised)   Improve Berg score  >/= 48/56 to reduce fall risk & demo improved balance. Baseline:  30/56 (8/19); score 41/56 on 11-21-22, 45/56 (12/16/22); score 43/56 on 01-10-23 Goal status: Ongoing  2.  Pt will be modified independent with household ambulation without device. Baseline: pt reports she is walking short distances in home without use of RW, but still using RW for longer distances in the home Goal status: Ongoing  3.  Improve 5x sit to stand score to </= 15 secs from chair without UE support without LOB to demo improved LE strength.  Baseline: 21.47 secs;  19.68 secs:   01-10-23   24.10 secs;   01-12-23 17.78 Goal status: Revised  4.  Improve TUG score to </= 17 secs with RW to demo improved functional mobility.  Baseline: 21.47 secs with RW;  21.12 secs:   20.41 secs,  18.81 secs without RW;   26.19 secs with RW (01-10-23);   21.56 secs with RW, 19.19 secs without RW Goal status:  Revised  5.   Increase gait speed to >/= 2.3 ft/sec with RW with pt demonstrating bil. Knee extension in stance.  Baseline: 1.52 ft/sec with RW (21.56 secs); 19.62,  20.00 = 1.67 ft/sec with RW;    01-12-23:  23.65, 23.19 secs = 1.41 ft/sec with RW Goal status: Revised  6.  Independent in updated HEP for balance and LE strengthening and core stabilization exercises. Baseline:  Goal status: NEW  7.  Pt will transfer floor to stand with UE support on mat table with SBA.  Baseline:  TBA  Goal status:  ONGOING   ASSESSMENT:  CLINICAL IMPRESSION:  Emphasis of skilled PT session on working on static standing balance, environmental scanning, SLS, stepping over obstacles, and functional LE strengthening. Pt exhibits the most difficulty clearing 4" foam blocks when stepping laterally as she frequently knocks them over. She also has difficulty with eccentric resisted  gait backwards due to weak hip abductors and extensors. Pt continues to benefit from skilled PT services to work on improving her balance and decreasing her fall risk. Continue POC.    OBJECTIVE IMPAIRMENTS: Abnormal gait, decreased  activity tolerance, decreased balance, decreased strength, and pain.   ACTIVITY LIMITATIONS: carrying, lifting, bending, squatting, stairs, transfers, reach over head, and locomotion level  PARTICIPATION LIMITATIONS: meal prep, cleaning, laundry, shopping, community activity, and pt reports she does not drive  PERSONAL FACTORS: Past/current experiences, Time since onset of injury/illness/exacerbation, and 1 comorbidity: MS and s/p back surgery  are also affecting patient's functional outcome.   REHAB POTENTIAL: Good  CLINICAL DECISION MAKING: Evolving/moderate complexity  EVALUATION COMPLEXITY: Moderate  PLAN:  PT FREQUENCY: 2x/week  PT DURATION: 4 weeks (8 additional visits)  PLANNED INTERVENTIONS: Therapeutic exercises, Therapeutic activity, Neuromuscular re-education, Balance training, Gait training, Patient/Family education, Self Care, Stair training, DME instructions, and Aquatic Therapy  PLAN FOR NEXT SESSION: Cont balance & LE strengthening Cont gait training with SPC with rubber quad base, balance training, floor transfer practice, single leg stance, stance progressions, LE strengthening, endurance, balance on compliant surfaces, hip abduction and flexion strengthening, countertop balance, resisted step taps, hip ROM and strengthening    Peter Congo, PT Peter Congo, PT, DPT, CSRS    01/31/2023, 10:15 AM   [facility] [address], [address] [place], [place] [address] [phone]

## 2023-02-03 ENCOUNTER — Ambulatory Visit: Payer: PPO | Admitting: Physical Therapy

## 2023-02-07 ENCOUNTER — Encounter: Payer: Self-pay | Admitting: Physical Therapy

## 2023-02-07 ENCOUNTER — Ambulatory Visit: Payer: PPO | Admitting: Physical Therapy

## 2023-02-07 DIAGNOSIS — R2689 Other abnormalities of gait and mobility: Secondary | ICD-10-CM | POA: Diagnosis not present

## 2023-02-07 DIAGNOSIS — H8111 Benign paroxysmal vertigo, right ear: Secondary | ICD-10-CM

## 2023-02-07 DIAGNOSIS — H8113 Benign paroxysmal vertigo, bilateral: Secondary | ICD-10-CM | POA: Diagnosis not present

## 2023-02-07 DIAGNOSIS — J0100 Acute maxillary sinusitis, unspecified: Secondary | ICD-10-CM | POA: Diagnosis not present

## 2023-02-07 DIAGNOSIS — Z9989 Dependence on other enabling machines and devices: Secondary | ICD-10-CM | POA: Diagnosis not present

## 2023-02-07 NOTE — Therapy (Addendum)
 OUTPATIENT PHYSICAL THERAPY NEURO TREATMENT NOTE    Patient Name: Katrina Rivera MRN: [id number] DOB:[birth year], 77 y.o., female Today's Date: 02/08/2023    PCP: Arloa Elsie SAUNDERS, MD REFERRING PROVIDER: Pegge Toribio PARAS, PA-C  END OF SESSION:  PT End of Session - 02/07/23 1842     Visit Number 28    Number of Visits 30    Date for PT Re-Evaluation 02/10/23    Authorization Type [employer]    Authorization Time Period 09-20-22 - 12-08-22; 11-21-22 - 02-07-23;  01-12-23 - 03-10-23    Progress Note Due on Visit 30    PT Start Time 1020    PT Stop Time 1100    PT Time Calculation (min) 40 min    Equipment Utilized During Treatment --    Activity Tolerance Patient tolerated treatment well    Behavior During Therapy WFL for tasks assessed/performed                             Past Medical History:  Diagnosis Date   Addison's disease (HCC)    takes Solu Cortef  daily   Anemia    takes Ferrous Sulfate  daily   Anxiety    takes Xanax  nightly   Arthritis    Chronic back pain    stenosis   Depression    takes Cymbalta  daily   Dizziness    if b/p drops    Fibromyalgia    History of blood transfusion    no abnormal reaction noted   History of bronchitis    many yrs ago    Hypokalemia    takes Potassium daily   Hypotension    takes Florinef  daily   IBS (irritable bowel syndrome)    takes Align daily   Insomnia    takes Trazodone  nightly   Joint pain    Multiple sclerosis (HCC)    doesn't take any meds   Multiple sclerosis (HCC)    New onset headache 12/22/2021   Nocturia    Osteoporosis    Palpitations    Peripheral neuropathy    Primary localized osteoarthritis of left knee 08/11/2020   Restless leg syndrome    Seasonal allergies    takes Zyrtec daily;uses Flonase  daily as needed   Syncope    when missed methylprednisolone  dose   Urinary frequency    takes Flomax  daily   Weakness    numbness and tingling   Past Surgical History:   Procedure Laterality Date   ABDOMINAL HYSTERECTOMY  02/07/1986   APPENDECTOMY  02/07/1986   CESAREAN SECTION  1973/1977   x2   CHOLECYSTECTOMY  02/08/1995   COLECTOMY  02/08/1988   COLONOSCOPY     ESOPHAGOGASTRODUODENOSCOPY     EYE SURGERY     bilateral - /w IOL- cataracts   FRACTURE SURGERY Right    rods and screws-right leg   LUMBAR LAMINECTOMY/DECOMPRESSION MICRODISCECTOMY Left 01/24/2013   Procedure: LUMBAR FIVE TO SACRAL ONE LUMBAR LAMINECTOMY/DECOMPRESSION MICRODISCECTOMY 1 LEVEL;  Surgeon: Alm GORMAN Molt, MD;  Location: [facility];  Service: Neurosurgery;  Laterality: Left;   MAXIMUM ACCESS (MAS)POSTERIOR LUMBAR INTERBODY FUSION (PLIF) 1 LEVEL N/A 06/18/2014   Procedure: MAXIMUM ACCESS SURGERY POSTERIOR LUMBAR INTERBODY FUSION LUMBAR FIVE TO SACRAL ONE ;  Surgeon: Alm GORMAN Molt, MD;  Location: [facility];  Service: Neurosurgery;  Laterality: N/A;   SPINAL CORD STIMULATOR BATTERY EXCHANGE Right 07/30/2021   Procedure: Spinal cord stimulator battery replacement;  Surgeon: Molt,  Alm RAMAN, MD;  Location: [facility];  Service: Neurosurgery;  Laterality: Right;   SPINAL CORD STIMULATOR IMPLANT     TOTAL KNEE ARTHROPLASTY Left 08/24/2020   Procedure: TOTAL KNEE ARTHROPLASTY;  Surgeon: Jane Charleston, MD;  Location: [facility];  Service: Orthopedics;  Laterality: Left;   Patient Active Problem List   Diagnosis Date Noted   Myofascial pain dysfunction syndrome 11/16/2022   Vertigo 11/16/2022   Lumbar burst fracture (HCC) 08/23/2022   Lumbar vertebral fracture (HCC) 08/19/2022   Chronic migraine w/o aura w/o status migrainosus, not intractable 03/29/2022   Mixed hyperlipidemia 12/27/2021   Chest pain of uncertain etiology 12/27/2021   New onset headache 12/22/2021   S/P lumbar fusion 07/30/2021   Primary localized osteoarthritis of left knee 08/11/2020   Atrophic vaginitis 08/11/2020   Menopausal symptom 08/11/2020   Restless leg syndrome    Chronic back pain    Low back pain without  sciatica 11/01/2018   Essential hypertension    SBO (small bowel obstruction) (HCC) 09/01/2016   Fibromyalgia    Abdominal pain, vomiting, and diarrhea    Dehydration    Somnolence, daytime 10/22/2015   Fatigue 10/22/2015   Chronic leg pain 06/29/2015   S/P lumbar spinal fusion 06/18/2014   Abnormality of gait 02/18/2013   S/P lumbar microdiscectomy 01/24/2013   Hypokalemia 12/24/2012   Sweating abnormality 10/19/2012   Dysautonomia orthostatic hypotension syndrome 08/16/2012   Hereditary and idiopathic peripheral neuropathy 08/16/2012   Chronic adrenal insufficiency (HCC) 08/16/2012   MS (multiple sclerosis) (HCC) 08/15/2012    ONSET DATE: 08-17-22  REFERRING DIAG: G35 (ICD-10-CM) - Multiple sclerosis   Diagnosis  S32.039D (ICD-10-CM) - Closed fracture of third lumbar vertebra with routine healing, unspecified fracture morphology, subsequent encounter    THERAPY DIAG:  BPPV (benign paroxysmal positional vertigo), right  Rationale for Evaluation and Treatment: Rehabilitation  SUBJECTIVE:                                                                                                                                                                                             SUBJECTIVE STATEMENT:  Pt reports she had to cancel PT appt last Friday because she had episode of dizziness; pt reports the dizziness has been occurring more frequently lately and she doesn't understand why.  Has appt this afternoon with her PCP, Dr. Arloa to discuss this - unable to see her neurologist.  Pt asks for first page of Medbridge HEP for the access code to be able to see the exercise videos on her Ipad  Pt accompanied by: self  PERTINENT HISTORY: spinal cord stimulator implant; MS diagnosed in 1984;  s/p 2 back surgeries  Per Chart note Brief HPI:   Katrina Rivera is a 78 y.o. female with a history of relapsing remitting multiple sclerosis who was evaluated by Dr. Joshua regarding gait  abnormality, chronic low back pain and status post lumbar fusion May 2024. She has reportedly fallen 4 times at home after PLIF of L3-5. She was admitted 08/17/2022 for scheduled surgery of L3 spinal fracture. She underwent posterior fixation L2-L4 using NuVasive cortical pedicle screws, arthrodesis and removal of segmental fixation L3-L5 by Dr. Joshua. PT OT evaluations carried out. Incision is healed without signs of infection. She is appropriate back soreness. She has no complaints of leg pain or new numbness, tingling or weakness per Dr. Tamala note today. Signs are stable and she is tolerating regular diet. Other past medical history includes restless leg syndrome, anxiety, headaches with migraine features, fibromyalgia, hypotension, anemia (acute on chronic).    PAIN:  Are you having pain? Yes: NPRS scale: 6/10 Pain location: low back pain into legs)  Pain description: tightness, throbbing, sore Aggravating factors: no specific Relieving factors: rest (TLSO has been discontinued by MD)   PRECAUTIONS: Back  - No bending, lifting >10#, twisting, reaching overhead; Dr Joshua responded with regular back precautions via inbasket message inquiring about precautions  RED FLAGS: None   WEIGHT BEARING RESTRICTIONS: No  FALLS: Has patient fallen in last 6 months? Yes. Number of falls 4  LIVING ENVIRONMENT: Lives with: lives with their spouse Lives in: House/apartment Stairs: Yes: External: 1 steps; planning on having rail installed Has following equipment at home: Walker - 2 wheeled, shower chair, and Grab bars  PLOF: Independent with household mobility with device, Requires assistive device for independence, and Needs assistance with ADLs  PATIENT GOALS: walk without walker and back brace and get back to where I was before  OBJECTIVE:   DIAGNOSTIC FINDINGS: IMPRESSION: 1. Unhealed fracture of the posterosuperior L3 body, ans associated avulsed appearance of both L3 pedicles (series  7, image 30) along the course of the bilateral L3 pedicle screws. Mild retropulsion of the posterior body fragment does not result in significant spinal stenosis. 2. No other acute osseous abnormality identified in the Lumbar Spine. But scant arthrodesis limited to only the right L5-S1 posterior elements following L3 through S1 fusion. Some endplate subsidence at each level. 3.  Aortic Atherosclerosis (ICD10-I70.0).  Nephrolithiasis.  COGNITION: Overall cognitive status: Within functional limits for tasks assessed   SENSATION: Impaired sensation in arms and legs - numbness and tingling  COORDINATION: WFL's - slowed movement due to back pain  POSTURE: rounded shoulders and forward head  LOWER EXTREMITY ROM:   WFL's bil. LE's   LOWER EXTREMITY MMT:  grossly 4/5 throughout but pain with resistance due to back pain (surgical site)  BED MOBILITY:  Independent   TRANSFERS: Assistive device utilized: Environmental Consultant - 4 wheeled  Sit to stand: Modified independence Stand to sit: Modified independence  STAIRS:  TBA  GAIT: Gait pattern:  increased knee flexion bil. LE's in stance  and step through pattern Distance walked: 87' Assistive device utilized: Environmental Consultant - 2 wheeled Level of assistance: SBA Comments: pt amb. With bil. Knees flexed  FUNCTIONAL TESTS:  5 times sit to stand: 21.47 secs from chair without UE support with close CGA Timed up and go (TUG): 21.47 secs with RW 10 meter walk test: 21.56 = 1.52 ft/sec with RW Berg Balance Scale: to be assessed  - pt stood for 2 without UE support with SBA  PATIENT  SURVEYS:  N/A - dx is MS  TODAY'S TREATMENT:   NeuroRe-ed: Positional testing -  Lt sidelying test - no nystagmus and no c/o vertigo Rt sidelying test - no nystagmus but c/o dizziness in Rt sidelying position  Rt Dix-Hallpike test - no nystagmus but c/o dizziness in test position Lt Dix-Hallpike test - no nystagmus & no c/o dizziness in test position  Epley maneuver  for Rt BPPV - posterior canalithiasis No rotary nystagmus noted but pt reported dizziness in initial position - symptoms improved on 2nd rep of Epley   PATIENT EDUCATION:  Education details: continue HEP, use RW at all times for safety; discussed etiology of BPPV - symptoms consistent with BPPV but pt has not been diagnosed with BPPV as of this time - has appt with PCP this afternoon Person educated: Patient Education method: Explanation, Demonstration, Tactile cues, and Verbal cues Education comprehension: verbalized understanding, returned demonstration, verbal cues required, tactile cues required, and needs further education  HOME EXERCISE PROGRAM:  Access Code: [email or web address] (combined into one HEP 12/16/22) URL: [email or web address] Date: 12/16/2022 Prepared by: Waddell Southgate  Exercises - Ankle Dorsiflexion with Resistance  - 1 x daily - 7 x weekly - 3 sets - 10 reps - Gastroc Stretch on Wall  - 1 x daily - 7 x weekly - 3 sets - 10 reps - Supine Knee Extension Stretch on Towel Roll  - 1 x daily - 7 x weekly - Supine Quad Set  - 1 x daily - 7 x weekly - 3 sets - 10 reps - Sit to Stand with Arms Crossed  - 1 x daily - 7 x weekly - 3 sets - 10 reps - Supine Bridge  - 1 x daily - 7 x weekly - 3 sets - 10 reps - Side Stepping with Resistance at Ankles and Counter Support  - 1 x daily - 7 x weekly - 3 sets - 10 reps - Supine Hamstring Stretch  - 1 x daily - 7 x weekly - 1 sets - 2-3 reps - 60 sec hold - Single Leg Stance with Support  - 1 x daily - 7 x weekly - 1 sets - 2-3 reps - 10 sec hold - Alternating Step Forward with Support  - 1 x daily - 7 x weekly - 1 sets - 10 reps - Step Sideways  - 1 x daily - 7 x weekly - 1 sets - 10 reps - Alternating Step Backward with Support  - 1 x daily - 7 x weekly - 1 sets - 10 reps - Side Stepping with Counter Support  - 1 x daily - 7 x weekly - 1 sets - 2-3 reps - Standing March with Counter Support  - 1 x daily - 7 x weekly - 1 sets - 10  reps - Standing Hip Extension with Counter Support  - 1 x daily - 7 x weekly - 3 sets - 10 reps - Standing Knee Flexion with Counter Support  - 1 x daily - 7 x weekly - 3 sets - 10 reps - Heel Raises with Counter Support  - 1 x daily - 7 x weekly - 3 sets - 10 reps - Standing Hip Abduction with Counter Support  - 1 x daily - 7 x weekly - 3 sets - 10 reps - Supine March  - 1 x daily - 7 x weekly - 3 sets - 10 reps - Supine Piriformis Stretch with Leg Straight  - 1 x daily -  7 x weekly - 1 sets - 3-5 reps - 30 sec hold - Supine Butterfly Groin Stretch  - 1 x daily - 7 x weekly - 1 sets - 3-5 reps - 30 sec hold  Added following stretches:  Medbridge  Access Code: [email or web address] URL: [email or web address] Date: 01/27/2023 Prepared by: Rock Kussmaul  Exercises - Single Knee to Chest Stretch  - 1 x daily - 7 x weekly - 1 sets - 1-2 reps - 15-20 hold - Supine Double Knee to Chest  - 1 x daily - 7 x weekly - 1 sets - 2 reps - 15-20 sec hold - Supine Lower Trunk Rotation  - 1 x daily - 7 x weekly - 1 sets - 2 reps - 15-20 sec hold   GOALS: Goals reviewed with patient? Yes   UPDATED LONG TERM GOALS: Target date: 02-17-23  (some initial LTG's revised)   Improve Berg score  >/= 48/56 to reduce fall risk & demo improved balance. Baseline: 30/56 (8/19); score 41/56 on 11-21-22, 45/56 (12/16/22); score 43/56 on 01-10-23 Goal status: Ongoing  2.  Pt will be modified independent with household ambulation without device. Baseline: pt reports she is walking short distances in home without use of RW, but still using RW for longer distances in the home Goal status: Ongoing  3.  Improve 5x sit to stand score to </= 15 secs from chair without UE support without LOB to demo improved LE strength.  Baseline: 21.47 secs;  19.68 secs:   01-10-23   24.10 secs;   01-12-23 17.78 Goal status: Revised  4.  Improve TUG score to </= 17 secs with RW to demo improved functional mobility.  Baseline: 21.47 secs  with RW;  21.12 secs:   20.41 secs,  18.81 secs without RW;   26.19 secs with RW (01-10-23);   21.56 secs with RW, 19.19 secs without RW Goal status:  Revised  5.   Increase gait speed to >/= 2.3 ft/sec with RW with pt demonstrating bil. Knee extension in stance.  Baseline: 1.52 ft/sec with RW (21.56 secs); 19.62,  20.00 = 1.67 ft/sec with RW;    01-12-23:  23.65, 23.19 secs = 1.41 ft/sec with RW Goal status: Revised  6.  Independent in updated HEP for balance and LE strengthening and core stabilization exercises. Baseline:  Goal status: NEW  7.  Pt will transfer floor to stand with UE support on mat table with SBA.  Baseline:  TBA  Goal status:  ONGOING   ASSESSMENT:  CLINICAL IMPRESSION:   PT session focused on assessment of vertigo as pt symptoms are consistent with BPPV.  Pt reported > dizziness in Rt Dix-Hallpike test position compared to that reported in Lt Dix-Hallpike test position.  No rotary nystagmus was noted in initial Dix-Hallpike test position, but pt reported reduced dizziness during 2nd rep of Epley.  Continue POC.    OBJECTIVE IMPAIRMENTS: Abnormal gait, decreased activity tolerance, decreased balance, decreased strength, and pain.   ACTIVITY LIMITATIONS: carrying, lifting, bending, squatting, stairs, transfers, reach over head, and locomotion level  PARTICIPATION LIMITATIONS: meal prep, cleaning, laundry, shopping, community activity, and pt reports she does not drive  PERSONAL FACTORS: Past/current experiences, Time since onset of injury/illness/exacerbation, and 1 comorbidity: MS and s/p back surgery  are also affecting patient's functional outcome.   REHAB POTENTIAL: Good  CLINICAL DECISION MAKING: Evolving/moderate complexity  EVALUATION COMPLEXITY: Moderate  PLAN:  PT FREQUENCY: 2x/week  PT DURATION: 4 weeks (8 additional visits)  PLANNED INTERVENTIONS: Therapeutic exercises,  Therapeutic activity, Neuromuscular re-education, Balance training, Gait  training, Patient/Family education, Self Care, Stair training, DME instructions, and Aquatic Therapy  PLAN FOR NEXT SESSION:  BPPV diagnosis?  Cont balance & LE strengthening Cont gait training with SPC with rubber quad base, balance training, floor transfer practice, single leg stance, stance progressions, LE strengthening, endurance, balance on compliant surfaces, hip abduction and flexion strengthening, countertop balance, resisted step taps, hip ROM and strengthening    Vina Byrd, [place], PT 02/08/2023, 5:51 PM   [facility] [address], [address] [place], [place] [address] [phone]

## 2023-02-09 ENCOUNTER — Encounter: Payer: Self-pay | Admitting: Physical Therapy

## 2023-02-09 ENCOUNTER — Ambulatory Visit: Payer: PPO | Attending: Physician Assistant | Admitting: Physical Therapy

## 2023-02-09 DIAGNOSIS — R2689 Other abnormalities of gait and mobility: Secondary | ICD-10-CM | POA: Diagnosis present

## 2023-02-09 DIAGNOSIS — M6281 Muscle weakness (generalized): Secondary | ICD-10-CM | POA: Insufficient documentation

## 2023-02-09 DIAGNOSIS — H8111 Benign paroxysmal vertigo, right ear: Secondary | ICD-10-CM | POA: Insufficient documentation

## 2023-02-09 DIAGNOSIS — R2681 Unsteadiness on feet: Secondary | ICD-10-CM | POA: Diagnosis present

## 2023-02-09 NOTE — Therapy (Signed)
 OUTPATIENT PHYSICAL THERAPY NEURO TREATMENT NOTE    Patient Name: Katrina Rivera MRN: [id number] DOB:1945/09/08, 78 y.o., female Today's Date: 02/09/2023    PCP: Katrina Elsie SAUNDERS, MD REFERRING PROVIDER: Pegge Toribio PARAS, PA-C  END OF SESSION:  PT End of Session - 02/09/23 1956     Visit Number 29    Number of Visits 30    Date for PT Re-Evaluation 02/17/23    Authorization Type [employer]    Authorization Time Period 09-20-22 - 12-08-22; 11-21-22 - 02-07-23;  01-12-23 - 03-10-23    Progress Note Due on Visit 30    PT Start Time 1105    PT Stop Time 1150    PT Time Calculation (min) 45 min    Activity Tolerance Patient tolerated treatment well    Behavior During Therapy WFL for tasks assessed/performed                             Past Medical History:  Diagnosis Date   Addison's disease (HCC)    takes Solu Cortef  daily   Anemia    takes Ferrous Sulfate  daily   Anxiety    takes Xanax  nightly   Arthritis    Chronic back pain    stenosis   Depression    takes Cymbalta  daily   Dizziness    if b/p drops    Fibromyalgia    History of blood transfusion    no abnormal reaction noted   History of bronchitis    many yrs ago    Hypokalemia    takes Potassium daily   Hypotension    takes Florinef  daily   IBS (irritable bowel syndrome)    takes Align daily   Insomnia    takes Trazodone  nightly   Joint pain    Multiple sclerosis (HCC)    doesn't take any meds   Multiple sclerosis (HCC)    New onset headache 12/22/2021   Nocturia    Osteoporosis    Palpitations    Peripheral neuropathy    Primary localized osteoarthritis of left knee 08/11/2020   Restless leg syndrome    Seasonal allergies    takes Zyrtec daily;uses Flonase  daily as needed   Syncope    when missed methylprednisolone  dose   Urinary frequency    takes Flomax  daily   Weakness    numbness and tingling   Past Surgical History:  Procedure Laterality Date   ABDOMINAL  HYSTERECTOMY  02/07/1986   APPENDECTOMY  02/07/1986   CESAREAN SECTION  1973/1977   x2   CHOLECYSTECTOMY  02/08/1995   COLECTOMY  02/08/1988   COLONOSCOPY     ESOPHAGOGASTRODUODENOSCOPY     EYE SURGERY     bilateral - /w IOL- cataracts   FRACTURE SURGERY Right    rods and screws-right leg   LUMBAR LAMINECTOMY/DECOMPRESSION MICRODISCECTOMY Left 01/24/2013   Procedure: LUMBAR FIVE TO SACRAL ONE LUMBAR LAMINECTOMY/DECOMPRESSION MICRODISCECTOMY 1 LEVEL;  Surgeon: Alm GORMAN Molt, MD;  Location: [facility];  Service: Neurosurgery;  Laterality: Left;   MAXIMUM ACCESS (MAS)POSTERIOR LUMBAR INTERBODY FUSION (PLIF) 1 LEVEL N/A 06/18/2014   Procedure: MAXIMUM ACCESS SURGERY POSTERIOR LUMBAR INTERBODY FUSION LUMBAR FIVE TO SACRAL ONE ;  Surgeon: Alm GORMAN Molt, MD;  Location: [facility];  Service: Neurosurgery;  Laterality: N/A;   SPINAL CORD STIMULATOR BATTERY EXCHANGE Right 07/30/2021   Procedure: Spinal cord stimulator battery replacement;  Surgeon: Molt Alm GORMAN, MD;  Location: [facility];  Service: Neurosurgery;  Laterality: Right;   SPINAL CORD STIMULATOR IMPLANT     TOTAL KNEE ARTHROPLASTY Left 08/24/2020   Procedure: TOTAL KNEE ARTHROPLASTY;  Surgeon: Jane Charleston, MD;  Location: [facility];  Service: Orthopedics;  Laterality: Left;   Patient Active Problem List   Diagnosis Date Noted   Myofascial pain dysfunction syndrome 11/16/2022   Vertigo 11/16/2022   Lumbar burst fracture (HCC) 08/23/2022   Lumbar vertebral fracture (HCC) 08/19/2022   Chronic migraine w/o aura w/o status migrainosus, not intractable 03/29/2022   Mixed hyperlipidemia 12/27/2021   Chest pain of uncertain etiology 12/27/2021   New onset headache 12/22/2021   S/P lumbar fusion 07/30/2021   Primary localized osteoarthritis of left knee 08/11/2020   Atrophic vaginitis 08/11/2020   Menopausal symptom 08/11/2020   Restless leg syndrome    Chronic back pain    Low back pain without sciatica 11/01/2018   Essential  hypertension    SBO (small bowel obstruction) (HCC) 09/01/2016   Fibromyalgia    Abdominal pain, vomiting, and diarrhea    Dehydration    Somnolence, daytime 10/22/2015   Fatigue 10/22/2015   Chronic leg pain 06/29/2015   S/P lumbar spinal fusion 06/18/2014   Abnormality of gait 02/18/2013   S/P lumbar microdiscectomy 01/24/2013   Hypokalemia 12/24/2012   Sweating abnormality 10/19/2012   Dysautonomia orthostatic hypotension syndrome 08/16/2012   Hereditary and idiopathic peripheral neuropathy 08/16/2012   Chronic adrenal insufficiency (HCC) 08/16/2012   MS (multiple sclerosis) (HCC) 08/15/2012    ONSET DATE: 08-17-22  REFERRING DIAG: G35 (ICD-10-CM) - Multiple sclerosis   Diagnosis  S32.039D (ICD-10-CM) - Closed fracture of third lumbar vertebra with routine healing, unspecified fracture morphology, subsequent encounter    THERAPY DIAG:  BPPV (benign paroxysmal positional vertigo), right  Rationale for Evaluation and Treatment: Rehabilitation  SUBJECTIVE:                                                                                                                                                                                             SUBJECTIVE STATEMENT:  Pt reports she saw her PCP, Katrina Rivera, on Tuesday and he diagnosed her with BPPV; prescribed Meclizine to take prn - pt took it this morning prior to PT - reports no major change in dizziness - took the Meclizine because it was prescribed, not because of dizziness  Pt accompanied by: self  PERTINENT HISTORY: spinal cord stimulator implant; MS diagnosed in 1984; s/p 2 back surgeries  Per Chart note Brief HPI:   Katrina Rivera is a 78 y.o. female with a history of relapsing remitting multiple sclerosis who was evaluated by Dr.  Jones regarding gait abnormality, chronic low back pain and status post lumbar fusion May 2024. She has reportedly fallen 4 times at home after PLIF of L3-5. She was admitted 08/17/2022 for  scheduled surgery of L3 spinal fracture. She underwent posterior fixation L2-L4 using NuVasive cortical pedicle screws, arthrodesis and removal of segmental fixation L3-L5 by Dr. Joshua. PT OT evaluations carried out. Incision is healed without signs of infection. She is appropriate back soreness. She has no complaints of leg pain or new numbness, tingling or weakness per Dr. Tamala note today. Signs are stable and she is tolerating regular diet. Other past medical history includes restless leg syndrome, anxiety, headaches with migraine features, fibromyalgia, hypotension, anemia (acute on chronic).    PAIN:  Are you having pain? Yes: NPRS scale: 6/10 Pain location: low back pain into legs)  Pain description: tightness, throbbing, sore Aggravating factors: no specific Relieving factors: rest (TLSO has been discontinued by MD)   PRECAUTIONS: Back  - No bending, lifting >10#, twisting, reaching overhead; Dr Katrina Rivera responded with regular back precautions via inbasket message inquiring about precautions  RED FLAGS: None   WEIGHT BEARING RESTRICTIONS: No  FALLS: Has patient fallen in last 6 months? Yes. Number of falls 4  LIVING ENVIRONMENT: Lives with: lives with their spouse Lives in: House/apartment Stairs: Yes: External: 1 steps; planning on having rail installed Has following equipment at home: Walker - 2 wheeled, shower chair, and Grab bars  PLOF: Independent with household mobility with device, Requires assistive device for independence, and Needs assistance with ADLs  PATIENT GOALS: walk without walker and back brace and get back to where I was before  OBJECTIVE:   DIAGNOSTIC FINDINGS: IMPRESSION: 1. Unhealed fracture of the posterosuperior L3 body, ans associated avulsed appearance of both L3 pedicles (series 7, image 30) along the course of the bilateral L3 pedicle screws. Mild retropulsion of the posterior body fragment does not result in significant spinal stenosis. 2.  No other acute osseous abnormality identified in the Lumbar Spine. But scant arthrodesis limited to only the right L5-S1 posterior elements following L3 through S1 fusion. Some endplate subsidence at each level. 3.  Aortic Atherosclerosis (ICD10-I70.0).  Nephrolithiasis.  COGNITION: Overall cognitive status: Within functional limits for tasks assessed   SENSATION: Impaired sensation in arms and legs - numbness and tingling  COORDINATION: WFL's - slowed movement due to back pain  POSTURE: rounded shoulders and forward head  LOWER EXTREMITY ROM:   WFL's bil. LE's   LOWER EXTREMITY MMT:  grossly 4/5 throughout but pain with resistance due to back pain (surgical site)  BED MOBILITY:  Independent   TRANSFERS: Assistive device utilized: Environmental Consultant - 4 wheeled  Sit to stand: Modified independence Stand to sit: Modified independence  STAIRS:  TBA  GAIT: Gait pattern:  increased knee flexion bil. LE's in stance  and step through pattern Distance walked: 77' Assistive device utilized: Environmental Consultant - 2 wheeled Level of assistance: SBA Comments: pt amb. With bil. Knees flexed  FUNCTIONAL TESTS:  5 times sit to stand: 21.47 secs from chair without UE support with close CGA Timed up and go (TUG): 21.47 secs with RW 10 meter walk test: 21.56 = 1.52 ft/sec with RW Berg Balance Scale: to be assessed  - pt stood for 2 without UE support with SBA  PATIENT SURVEYS:  N/A - dx is MS   TODAY'S TREATMENT:  NeuroRe-ed:  Rt Dix-Hallpike test - no nystagmus but c/o min. dizziness in test position  Epley maneuver for Rt BPPV -  posterior canalithiasis - 2 reps  No rotary nystagmus noted - pt reported min. dizziness during maneuver - does not give specific description of dizziness; symptoms appeared to be improved on 3rd rep Instructed pt to NOT take meclizine within 24 hours prior to next scheduled PT appt. - pt verbalized understanding & agreement  Orthostatic Assessment:    Vestibular  Assessment - 02/09/23 0001       Orthostatics   BP supine (x 5 minutes) 160/100   112/88   HR supine (x 5 minutes) 90    BP sitting 138/81    HR sitting 99    BP standing (after 1 minute) 162/89    HR standing (after 1 minute) 105    BP standing (after 3 minutes) 151/77    HR standing (after 3 minutes) 99               PATIENT EDUCATION:  Education details: continue HEP, use RW at all times for safety; discussed etiology of BPPV; discussed Brandt-Daroff exercises as these were issued by her PCP - explained that she may have increased dizziness with return to upright due to the orthostatic hypotension  Person educated: Patient Education method: Explanation, Demonstration, Tactile cues, and Verbal cues Education comprehension: verbalized understanding, returned demonstration, verbal cues required, tactile cues required, and needs further education  HOME EXERCISE PROGRAM:  Access Code: [email or web address] (combined into one HEP 12/16/22) URL: [email or web address] Date: 12/16/2022 Prepared by: Waddell Southgate  Exercises - Ankle Dorsiflexion with Resistance  - 1 x daily - 7 x weekly - 3 sets - 10 reps - Gastroc Stretch on Wall  - 1 x daily - 7 x weekly - 3 sets - 10 reps - Supine Knee Extension Stretch on Towel Roll  - 1 x daily - 7 x weekly - Supine Quad Set  - 1 x daily - 7 x weekly - 3 sets - 10 reps - Sit to Stand with Arms Crossed  - 1 x daily - 7 x weekly - 3 sets - 10 reps - Supine Bridge  - 1 x daily - 7 x weekly - 3 sets - 10 reps - Side Stepping with Resistance at Ankles and Counter Support  - 1 x daily - 7 x weekly - 3 sets - 10 reps - Supine Hamstring Stretch  - 1 x daily - 7 x weekly - 1 sets - 2-3 reps - 60 sec hold - Single Leg Stance with Support  - 1 x daily - 7 x weekly - 1 sets - 2-3 reps - 10 sec hold - Alternating Step Forward with Support  - 1 x daily - 7 x weekly - 1 sets - 10 reps - Step Sideways  - 1 x daily - 7 x weekly - 1 sets - 10 reps -  Alternating Step Backward with Support  - 1 x daily - 7 x weekly - 1 sets - 10 reps - Side Stepping with Counter Support  - 1 x daily - 7 x weekly - 1 sets - 2-3 reps - Standing March with Counter Support  - 1 x daily - 7 x weekly - 1 sets - 10 reps - Standing Hip Extension with Counter Support  - 1 x daily - 7 x weekly - 3 sets - 10 reps - Standing Knee Flexion with Counter Support  - 1 x daily - 7 x weekly - 3 sets - 10 reps - Heel Raises with Counter Support  - 1 x daily - 7  x weekly - 3 sets - 10 reps - Standing Hip Abduction with Counter Support  - 1 x daily - 7 x weekly - 3 sets - 10 reps - Supine March  - 1 x daily - 7 x weekly - 3 sets - 10 reps - Supine Piriformis Stretch with Leg Straight  - 1 x daily - 7 x weekly - 1 sets - 3-5 reps - 30 sec hold - Supine Butterfly Groin Stretch  - 1 x daily - 7 x weekly - 1 sets - 3-5 reps - 30 sec hold  Added following stretches:  [employer]  Access Code: [email or web address] URL: [email or web address] Date: 01/27/2023 Prepared by: Rock Kussmaul  Exercises - Single Knee to Chest Stretch  - 1 x daily - 7 x weekly - 1 sets - 1-2 reps - 15-20 hold - Supine Double Knee to Chest  - 1 x daily - 7 x weekly - 1 sets - 2 reps - 15-20 sec hold - Supine Lower Trunk Rotation  - 1 x daily - 7 x weekly - 1 sets - 2 reps - 15-20 sec hold   GOALS: Goals reviewed with patient? Yes   UPDATED LONG TERM GOALS: Target date: 02-17-23  (some initial LTG's revised)   Improve Berg score  >/= 48/56 to reduce fall risk & demo improved balance. Baseline: 30/56 (8/19); score 41/56 on 11-21-22, 45/56 (12/16/22); score 43/56 on 01-10-23 Goal status: Ongoing  2.  Pt will be modified independent with household ambulation without device. Baseline: pt reports she is walking short distances in home without use of RW, but still using RW for longer distances in the home Goal status: Ongoing  3.  Improve 5x sit to stand score to </= 15 secs from chair without UE support  without LOB to demo improved LE strength.  Baseline: 21.47 secs;  19.68 secs:   01-10-23   24.10 secs;   01-12-23 17.78 Goal status: Revised  4.  Improve TUG score to </= 17 secs with RW to demo improved functional mobility.  Baseline: 21.47 secs with RW;  21.12 secs:   20.41 secs,  18.81 secs without RW;   26.19 secs with RW (01-10-23);   21.56 secs with RW, 19.19 secs without RW Goal status:  Revised  5.   Increase gait speed to >/= 2.3 ft/sec with RW with pt demonstrating bil. Knee extension in stance.  Baseline: 1.52 ft/sec with RW (21.56 secs); 19.62,  20.00 = 1.67 ft/sec with RW;    01-12-23:  23.65, 23.19 secs = 1.41 ft/sec with RW Goal status: Revised  6.  Independent in updated HEP for balance and LE strengthening and core stabilization exercises. Baseline:  Goal status: NEW  7.  Pt will transfer floor to stand with UE support on mat table with SBA.  Baseline:  TBA  Goal status:  ONGOING   ASSESSMENT:  CLINICAL IMPRESSION:   PT session focused on treatment of Rt BPPV posterior canalithiasis.  Pt took Meclizine prior to today's appt but subjectively reported no major change in dizziness compared to that experienced on Tuesday this week.  No nystagmus noted in any position of Epley maneuver.  Pt's dizziness appears to be multi-factorial as pt has orthostatic hypotension, BPPV which appears to be resolving, and dizziness possibly related to MS disease process.  Pt unable to describe dizziness; pt states she took the meclizine because it was prescribed (to take prn) and did not take it because she was having increased dizziness prior to PT appt.  Will continue to assess and treat prn.     OBJECTIVE IMPAIRMENTS: Abnormal gait, decreased activity tolerance, decreased balance, decreased strength, and pain.   ACTIVITY LIMITATIONS: carrying, lifting, bending, squatting, stairs, transfers, reach over head, and locomotion level  PARTICIPATION LIMITATIONS: meal prep, cleaning, laundry,  shopping, community activity, and pt reports she does not drive  PERSONAL FACTORS: Past/current experiences, Time since onset of injury/illness/exacerbation, and 1 comorbidity: MS and s/p back surgery  are also affecting patient's functional outcome.   REHAB POTENTIAL: Good  CLINICAL DECISION MAKING: Evolving/moderate complexity  EVALUATION COMPLEXITY: Moderate  PLAN:  PT FREQUENCY: 2x/week  PT DURATION: 4 weeks (8 additional visits)  PLANNED INTERVENTIONS: Therapeutic exercises, Therapeutic activity, Neuromuscular re-education, Balance training, Gait training, Patient/Family education, Self Care, Stair training, DME instructions, and Aquatic Therapy  PLAN FOR NEXT SESSION:  Cont balance & LE strengthening Cont gait training with SPC with rubber quad base, balance training, floor transfer practice, single leg stance, stance progressions, LE strengthening, endurance, balance on compliant surfaces, hip abduction and flexion strengthening, countertop balance, resisted step taps, hip ROM and strengthening    Jadelyn Elks, [place], PT 02/09/2023, 7:59 PM   [facility] [address], [address] [place], [place] [address] [phone]

## 2023-02-14 ENCOUNTER — Ambulatory Visit: Payer: PPO | Admitting: Physical Therapy

## 2023-02-14 DIAGNOSIS — H8111 Benign paroxysmal vertigo, right ear: Secondary | ICD-10-CM | POA: Diagnosis not present

## 2023-02-14 DIAGNOSIS — R2689 Other abnormalities of gait and mobility: Secondary | ICD-10-CM

## 2023-02-14 DIAGNOSIS — M6281 Muscle weakness (generalized): Secondary | ICD-10-CM

## 2023-02-14 DIAGNOSIS — R2681 Unsteadiness on feet: Secondary | ICD-10-CM

## 2023-02-14 NOTE — Progress Notes (Addendum)
 ASSESSMENT AND PLAN 78 y.o. year old female   1.  Possible relapsing remitting multiple sclerosis -MRI of the brain in November 2024 showed stable periventricular white matter lesions consistent with MS, no new or enhancing lesions were seen  2.  Gait abnormality 3.  Chronic low back pain 4.  Post lumbar fusion May 2024, ORIF L3 fracture July 2024 -Has close follow-up with Dr. Joshua, is on hydrocodone  as needed  5.  Restless leg syndrome -Stopped gabapentin  to see if contribution to dizziness -Also on Horizant  600 mg daily  6.  Anxiety -Stopped Cymbalta  on her own contributing to dizziness? -On Klonopin  0.5 mg at bedtime from PCP -Continue work with primary care for better management of anxiety  7. Headaches with migraine features  -No benefit from Aimovig  -Dr. Lovorn started Topamax  100 mg at bedtime today -Normal ESR C-reactive protein  8.  Dizziness 9.  Orthostatic hypotension 10.  Addison's Disease -Has been working for the last 3 weeks with physical therapy doing vestibular rehab.  Had good benefit with the Epley maneuver.  She we will continue this at home. Will be discharged tomorrow, PT feels she has met maximal benefit -Remains on Florinef , seeing endocrinology next week, stay hydrated -Stand slowly, safe ambulation   11. Vision disturbance -Reports words overlapping in vision, saw Dr. Octavia today, I will try to get the records -Addendum 02/22/2023 SS: Records from Dr. Octavia reporting diplopia, VF and OCT do not reveal any causes of her complaint.  Will reevaluate in 4 months since problem has already been going on for 2 months to consider prism  glasses.  She had MRI of the brain with and without contrast 12/28/2022 showing no acute problem.  Follow up with Dr. Onita in 6 months to rotate visits with me.  DIAGNOSTIC DATA (LABS, IMAGING, TESTING) - I reviewed patient records, labs, notes, testing and imaging myself where available.   HISTORY  Katrina Rivera is a very  pleasant 78 year-old right-handed woman, with relapsing remitting multiple sclerosis, was treated with Betaseron 4 -5 years, but could not tolerate the side effect, now is not on any immunomodulation therapy. neuropathic pain of bilateral lower extremities.   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    She has baseline gait difficulty, she fell in May 2013, fractured both elbows and her left knee cap.  She intermittently has been using a cane or walker.  She could not tolerate Requip  because of nausea. She has been on gabapentin  300 mg 3 bid. She gained significant weight gain when on Lyrica, and therefore was switched back to gabapentin . She has orthostatic hypotension, and is on midodrine  2.5 mg and fludrocortisone  0.1 mg 1 in AM and 2 at night, and she is also on 20 mg of hydrocortisone .   She complains of bilateral lower extremity burning stinging sensation, getting worse when she sits still, difficulty falling into sleep, she has the urge to move because of bilateral extremity discomfort, has tried Requip , could not tolerate it because of GI side effects, Neuprol patch does not work either, she also tried Elavil,  Cymbalta  in the past, could not tolerate it due to side effect.   UPDATE Nov 17 2020: She is accompanied by her husband at today's visit, just had right knee replacement in July 2022, finishing up physical therapy, continue has gait abnormality, wearing ankle brace now, seems to help her some  She continue to  take Horizant  600 mg at bedtime, gabapentin  100 mg 1 to 2 tablets every night as needed  She complains of intermittent diffuse body achy sensation,  UPDATE May 17 2022: She is companied by her husband, constellation of complaints, worsening anxiety, difficulty with memory, difficulty sleeping, worsening low back pain, is under the care of neurosurgeon Dr. Alm Molt, planning on CT myelogram, previous lumbar decompression surgery twice,  She is already on polypharmacy treatment, including gabapentin  300 mg 2 to 3 tablets at bedtime, horizant  600 mg every night, Cymbalta  60 mg daily,  Her headache is under better control, tolerating Ajovy  monthly  Personally reviewed MRI of the brain in October 2023, no acute abnormality, multiple T2/FLAIR signal abnormality, small vessel disease, versus possible multiple sclerosis, compared to previous study in 2015, there was 2 new foci of white matter and right subcortical region, no contrast-enhancement  Update August 04, 2022 SS: Saw Dr. Onita in April 2024, started on clonazepam  0.5 mg as needed, did 3 months of Ajovy  didn't see much benefit, switched to Aimovig  and Nurtec PRN. Admitted 06/22/22 with Dr. Molt she had PLIF L3-4.  With no surgical complications. Since surgery her left leg has been numb, tingling, weak. Had MRI Lumbar spine 6/22, seeing Dr. Molt next week. Fall 2 weeks post-op, went backwards. Using walker now. Needs refill on Klonopin  0.5 mg 1/2 tablet daily PRN anxiety, needs Horizant , Cymbalta , tizanidine . On Aimovig  70 mg, has only done 1 injection. Has only needed Nurtec once, it helped. Gabapentin  900 mg at bedtime.  Having a lot of low back pain, cannot straighten up. She worries her MS is coming back. Had trouble with PT, paused it.   Update February 15, 2023 SS: Had 2nd lumbar surgery in July had lumbar fusion. In the ER in November had a fall getting out of bed, hit elbow on the nightstand, head against the bed, husband put her on the bed.  Had bladder retention, placed foley cath, started oral antibiotics for UTI. MRI brain in November 2024 showed stable MS lesions. MRI thoracic spine was fine. Has not started urine cath, has had 2 UTIs since. Sees urologist. Has dizziness for months, constant, stopped Cymbalta /gabapentin  to see if benefit. Went back to Horizant . Saw Dr. Octavia today, when reading the words are squished together, unclear etiology told her to follow up in 4 months. No longer on Aimovig . Has headache daily, generalized, takes Tylenol . Saw Dr. Lovorn today, started Topamax  100 mg at bedtime for headaches. Has poorly healing wound to right shin. Takes hydrocodone  daily for back pain. Remains on Florinef , not drinking enough water.  Seeing endocrinology next week.  Orthostatic today 166/80, 154/78, 133/72.  REVIEW OF SYSTEMS: Out of a complete 14 system review of symptoms, the patient complains only of the following symptoms, and all other reviewed systems are negative.  See HPI  PHYSICAL EXAM  Vitals:   05/17/22 1320  BP: (!) 169/91  Pulse: (!) 104  Weight: 112 lb 8 oz (51 kg)  Height: 5' (1.524 m)   Body mass index is 21.97 kg/m.  PHYSICAL EXAMNIATION:  General: The patient is alert and cooperative at the time of the examination.  Using a walker postop. Wound to right lower leg  Skin: No significant peripheral edema is noted.  Neurologic Exam  Mental status: The patient is alert and oriented x 3 at the time of the examination. The patient has apparent normal recent and remote memory, with an apparently normal attention span and concentration ability.  Is in good spirits, is cooperative, good historian.  Cranial nerves: Facial symmetry is present. Speech is normal, no aphasia or dysarthria is noted. Extraocular movements are full. Visual fields are full.  Motor: The patient has good strength, exception left hip flexion 4/5  Sensory examination: Soft touch sensation is symmetric on the face, arms, and  legs.  Coordination: The patient has good finger-nose-finger and heel-to-shin bilaterally.  Gait and station: Slow to rise from seated position, has to push off, gait is cautious, wide-based, uses walker in the hallway  Reflexes: Deep tendon reflexes are symmetric and normal.  ALLERGIES: Allergies  Allergen Reactions   Amoxicillin Other (See Comments)    Upset stomach   Aspirin      bruising   Azithromycin     stomach upset   Doxycycline Hyclate     GI upset   Hydroxyzine     Other Reaction(s): Hallucinations, Other   Hydroxyzine Hcl Other (See Comments)   Buspirone  Palpitations   Buspirone  Hcl Palpitations   Mirtazapine Palpitations    Weight gain    HOME MEDICATIONS: Outpatient Medications Prior to Visit  Medication Sig Dispense Refill   acetaminophen  (TYLENOL ) 325 MG tablet Take 1-2 tablets (325-650 mg total) by mouth every 4 (four) hours as needed for mild pain.     albuterol  (VENTOLIN  HFA) 108 (90 Base) MCG/ACT inhaler Inhale 2 puffs into the lungs every 6 (six) hours as needed for wheezing.     alfuzosin  (UROXATRAL ) 10 MG 24 hr tablet Take 10 mg by mouth daily with breakfast.     amLODipine  (NORVASC ) 2.5 MG tablet Take 1 tablet (2.5 mg total) by mouth daily. 30 tablet 0   bethanechol  (URECHOLINE ) 10 MG tablet Take 10 mg by mouth 2 (two) times daily.     buPROPion  (WELLBUTRIN  XL) 300 MG 24 hr tablet Take 300 mg by mouth every morning.     cephALEXin  (KEFLEX ) 250 MG capsule Take 250 mg by mouth every other day.     cholecalciferol  (VITAMIN D3) 25 MCG (1000 UNIT) tablet Take 1,000 Units by mouth every morning.     clonazePAM  (KLONOPIN ) 0.5 MG tablet Take 1 tablet (0.5 mg total) by mouth at bedtime as needed for anxiety. (Patient taking differently: Take 0.5 mg by mouth at bedtime.) 30 tablet 1   CRANBERRY PO Take 1 tablet by mouth 2 (two) times daily.     dicyclomine  (BENTYL ) 20 MG tablet Take 1 tablet by mouth 4 (four) times daily as needed for spasms.     DULoxetine   (CYMBALTA ) 60 MG capsule Take 1 capsule (60 mg total) by mouth daily. 90 capsule 3   ferrous sulfate  325 (65 FE) MG tablet Take 325 mg by mouth daily with breakfast.     fludrocortisone  (FLORINEF ) 0.1 MG tablet Take 1 tablet daily 90 tablet 3   fluticasone  (CUTIVATE ) 0.05 % cream Apply 1 application  topically 2 (two) times daily as needed for irritation (Rosacea).     fluticasone  (FLONASE ) 50 MCG/ACT nasal spray Place 1 spray into both nostrils daily as needed for allergies or rhinitis.     gabapentin  (NEURONTIN ) 300 MG capsule Take 3 capsules (900 mg total) by mouth  at bedtime.     gabapentin  (NEURONTIN ) 300 MG capsule Take 1 capsule (300 mg total) by mouth daily at 6 (six) AM.     HYDROcodone -acetaminophen  (NORCO) 7.5-325 MG tablet Take 1 tablet by mouth every 4 (four) hours as needed for moderate pain ((score 4 to 6)). 30 tablet 0   HYDROcodone -acetaminophen  (NORCO/VICODIN) 5-325 MG tablet Take 1 tablet by mouth every 8 (eight) hours as needed for moderate pain (pain score 4-6).     methylPREDNISolone  (MEDROL ) 4 MG tablet as directed Orally 1 tablet AM and 1/2 tablet afternoon and take additional in case of illness as instructed. 160 tablet 3   Multiple Vitamin (MULTIVITAMIN) tablet Take 1 tablet by mouth in the morning.     ondansetron  (ZOFRAN ) 4 MG tablet Take 1 tablet (4 mg total) by mouth every 8 (eight) hours as needed for nausea or vomiting. 90 tablet 3   oxyCODONE -acetaminophen  (PERCOCET) 10-325 MG tablet Take 1 tablet by mouth every 4 (four) hours as needed for pain.     oxyCODONE -acetaminophen  (PERCOCET/ROXICET) 5-325 MG tablet Take 1 tablet by mouth every 6 (six) hours as needed for severe pain (pain score 7-10).     pantoprazole  (PROTONIX ) 20 MG tablet Take 20 mg by mouth every morning.     Polyethyl Glycol-Propyl Glycol (SYSTANE OP) Place 2 drops into both eyes daily as needed (for dry eyes).     polyethylene glycol (MIRALAX ) 17 g packet Take 17 g by mouth 2 (two) times daily. 17  grams in 6 oz of favorite drink twice a day until bowel movement.  LAXITIVE.  Restart if two days since last bowel movement (Patient taking differently: Take 17 g by mouth daily as needed for moderate constipation.) 14 packet 0   Probiotic Product (PROBIOTIC PO) Take 1 capsule by mouth every morning.     promethazine  (PHENERGAN ) 25 MG tablet Take 25 mg by mouth every 6 (six) hours as needed for nausea or vomiting.     tiZANidine  (ZANAFLEX ) 2 MG tablet Take 2 tablets (4 mg total) by mouth every 8 (eight) hours as needed for muscle spasms. (Patient taking differently: Take 4 mg by mouth at bedtime.) 60 tablet 1   topiramate  (TOPAMAX ) 50 MG tablet Take 1 tablet (50 mg total) by mouth at bedtime. X 1 week, then 75 mg nightly x 1 week, ,then 100 mg at bedtime- for prevention of headaches 60 tablet 5   No facility-administered medications prior to visit.    PAST MEDICAL HISTORY: Past Medical History:  Diagnosis Date   Addison's disease (HCC)    takes Solu Cortef  daily   Anemia    takes Ferrous Sulfate  daily   Anxiety    takes Xanax  nightly   Arthritis    Chronic back pain    stenosis   Depression    takes Cymbalta  daily   Dizziness    if b/p drops    Fibromyalgia    History of blood transfusion    no abnormal reaction noted   History of bronchitis    many yrs ago    Hypokalemia    takes Potassium daily   Hypotension    takes Florinef  daily   IBS (irritable bowel syndrome)    takes Align daily   Insomnia    takes Trazodone  nightly   Joint pain    Multiple sclerosis (HCC)    doesn't take any meds   Multiple sclerosis (HCC)    New onset headache 12/22/2021   Nocturia    Osteoporosis  Palpitations    Peripheral neuropathy    Primary localized osteoarthritis of left knee 08/11/2020   Restless leg syndrome    Seasonal allergies    takes Zyrtec daily;uses Flonase  daily as needed   Syncope    when missed methylprednisolone  dose   Urinary frequency    takes Flomax  daily    Weakness    numbness and tingling    PAST SURGICAL HISTORY: Past Surgical History:  Procedure Laterality Date   ABDOMINAL HYSTERECTOMY  02/07/1986   APPENDECTOMY  02/07/1986   CESAREAN SECTION  1973/1977   x2   CHOLECYSTECTOMY  02/08/1995   COLECTOMY  02/08/1988   COLONOSCOPY     ESOPHAGOGASTRODUODENOSCOPY     EYE SURGERY     bilateral - /w IOL- cataracts   FRACTURE SURGERY Right    rods and screws-right leg   LUMBAR LAMINECTOMY/DECOMPRESSION MICRODISCECTOMY Left 01/24/2013   Procedure: LUMBAR FIVE TO SACRAL ONE LUMBAR LAMINECTOMY/DECOMPRESSION MICRODISCECTOMY 1 LEVEL;  Surgeon: Alm GORMAN Molt, MD;  Location: [facility];  Service: Neurosurgery;  Laterality: Left;   MAXIMUM ACCESS (MAS)POSTERIOR LUMBAR INTERBODY FUSION (PLIF) 1 LEVEL N/A 06/18/2014   Procedure: MAXIMUM ACCESS SURGERY POSTERIOR LUMBAR INTERBODY FUSION LUMBAR FIVE TO SACRAL ONE ;  Surgeon: Alm GORMAN Molt, MD;  Location: [facility];  Service: Neurosurgery;  Laterality: N/A;   SPINAL CORD STIMULATOR BATTERY EXCHANGE Right 07/30/2021   Procedure: Spinal cord stimulator battery replacement;  Surgeon: Molt Alm GORMAN, MD;  Location: [facility];  Service: Neurosurgery;  Laterality: Right;   SPINAL CORD STIMULATOR IMPLANT     TOTAL KNEE ARTHROPLASTY Left 08/24/2020   Procedure: TOTAL KNEE ARTHROPLASTY;  Surgeon: Jane Charleston, MD;  Location: [facility];  Service: Orthopedics;  Laterality: Left;    FAMILY HISTORY: Family History  Problem Relation Age of Onset   Hypertension Mother    Stroke Mother    Heart attack Father    Tremor Brother     SOCIAL HISTORY: Social History   Socioeconomic History   Marital status: Married    Spouse name: [name]   Number of children: 2   Years of education: 12   Highest education level: Not on file  Occupational History    Employer: DISABLED    Comment: Disabled  Tobacco Use   Smoking status: Never   Smokeless tobacco: Never  Vaping Use   Vaping status: Never Used  Substance and  Sexual Activity   Alcohol use: Never   Drug use: Never   Sexual activity: Not on file  Other Topics Concern   Not on file  Social History Narrative   Pt lives at home with spouse. Katrina Rivera) [phone] (patient's cell)   [name]'s cell  [phone]      Caffeine Use: 1 cups daily.Right handed.Disabled.Education - high schoolPatient has two adult children.   Social Drivers of Corporate Investment Banker Strain: Not on file  Food Insecurity: No Food Insecurity (08/17/2022)   Hunger Vital Sign    Worried About Running Out of Food in the Last Year: Never true    Ran Out of Food in the Last Year: Never true  Transportation Needs: Not on file  Physical Activity: Not on file  Stress: Not on file  Social Connections: Not on file  Intimate Partner Violence: Not At Risk (08/17/2022)   Humiliation, Afraid, Rape, and Kick questionnaire    Fear of Current or Ex-Partner: No    Emotionally Abused: No    Physically Abused: No    Sexually Abused: No  Katrina Gayland MANDES, DNP  [facility] [address] [place], [place] [address] [phone]

## 2023-02-14 NOTE — Therapy (Signed)
 OUTPATIENT PHYSICAL THERAPY NEURO TREATMENT NOTE/30TH VISIT PROGRESS NOTE  Progress Note  Reporting Period 01-10-23 to 02-14-23  See note below for Objective Data and Assessment of Progress/Goals.  Thank you for the referral of this patient.    Patient Name: Katrina Rivera MRN: [id number] DOB:1945/07/01, 78 y.o., female Today's Date: 02/15/2023    PCP: Arloa Elsie SAUNDERS, MD REFERRING PROVIDER: Pegge Toribio PARAS, PA-C  END OF SESSION:  PT End of Session - 02/15/23 1759     Visit Number 30    Number of Visits 30    Date for PT Re-Evaluation 02/17/23    Authorization Type [employer]    Authorization Time Period 09-20-22 - 12-08-22; 11-21-22 - 02-07-23;  01-12-23 - 03-10-23    Progress Note Due on Visit 30    PT Start Time 1018    PT Stop Time 1100    PT Time Calculation (min) 42 min    Equipment Utilized During Treatment Gait belt    Activity Tolerance Patient tolerated treatment well    Behavior During Therapy WFL for tasks assessed/performed                              Past Medical History:  Diagnosis Date   Addison's disease (HCC)    takes Solu Cortef  daily   Anemia    takes Ferrous Sulfate  daily   Anxiety    takes Xanax  nightly   Arthritis    Chronic back pain    stenosis   Depression    takes Cymbalta  daily   Dizziness    if b/p drops    Fibromyalgia    History of blood transfusion    no abnormal reaction noted   History of bronchitis    many yrs ago    Hypokalemia    takes Potassium daily   Hypotension    takes Florinef  daily   IBS (irritable bowel syndrome)    takes Align daily   Insomnia    takes Trazodone  nightly   Joint pain    Multiple sclerosis (HCC)    doesn't take any meds   Multiple sclerosis (HCC)    New onset headache 12/22/2021   Nocturia    Osteoporosis    Palpitations    Peripheral neuropathy    Primary localized osteoarthritis of left knee 08/11/2020   Restless leg syndrome    Seasonal allergies    takes  Zyrtec daily;uses Flonase  daily as needed   Syncope    when missed methylprednisolone  dose   Urinary frequency    takes Flomax  daily   Weakness    numbness and tingling   Past Surgical History:  Procedure Laterality Date   ABDOMINAL HYSTERECTOMY  02/07/1986   APPENDECTOMY  02/07/1986   CESAREAN SECTION  1973/1977   x2   CHOLECYSTECTOMY  02/08/1995   COLECTOMY  02/08/1988   COLONOSCOPY     ESOPHAGOGASTRODUODENOSCOPY     EYE SURGERY     bilateral - /w IOL- cataracts   FRACTURE SURGERY Right    rods and screws-right leg   LUMBAR LAMINECTOMY/DECOMPRESSION MICRODISCECTOMY Left 01/24/2013   Procedure: LUMBAR FIVE TO SACRAL ONE LUMBAR LAMINECTOMY/DECOMPRESSION MICRODISCECTOMY 1 LEVEL;  Surgeon: Alm GORMAN Molt, MD;  Location: [facility];  Service: Neurosurgery;  Laterality: Left;   MAXIMUM ACCESS (MAS)POSTERIOR LUMBAR INTERBODY FUSION (PLIF) 1 LEVEL N/A 06/18/2014   Procedure: MAXIMUM ACCESS SURGERY POSTERIOR LUMBAR INTERBODY FUSION LUMBAR FIVE TO SACRAL ONE ;  Surgeon: Alm  GORMAN Molt, MD;  Location: [facility];  Service: Neurosurgery;  Laterality: N/A;   SPINAL CORD STIMULATOR BATTERY EXCHANGE Right 07/30/2021   Procedure: Spinal cord stimulator battery replacement;  Surgeon: Molt Alm GORMAN, MD;  Location: [facility] OR;  Service: Neurosurgery;  Laterality: Right;   SPINAL CORD STIMULATOR IMPLANT     TOTAL KNEE ARTHROPLASTY Left 08/24/2020   Procedure: TOTAL KNEE ARTHROPLASTY;  Surgeon: Jane Charleston, MD;  Location: [facility];  Service: Orthopedics;  Laterality: Left;   Patient Active Problem List   Diagnosis Date Noted   Myofascial pain dysfunction syndrome 11/16/2022   Vertigo 11/16/2022   Lumbar burst fracture (HCC) 08/23/2022   Lumbar vertebral fracture (HCC) 08/19/2022   Chronic migraine w/o aura w/o status migrainosus, not intractable 03/29/2022   Mixed hyperlipidemia 12/27/2021   Chest pain of uncertain etiology 12/27/2021   New onset headache 12/22/2021   S/P lumbar fusion  07/30/2021   Primary localized osteoarthritis of left knee 08/11/2020   Atrophic vaginitis 08/11/2020   Menopausal symptom 08/11/2020   Restless leg syndrome    Chronic back pain    Low back pain without sciatica 11/01/2018   Essential hypertension    SBO (small bowel obstruction) (HCC) 09/01/2016   Fibromyalgia    Abdominal pain, vomiting, and diarrhea    Dehydration    Somnolence, daytime 10/22/2015   Fatigue 10/22/2015   Chronic leg pain 06/29/2015   S/P lumbar spinal fusion 06/18/2014   Abnormality of gait 02/18/2013   S/P lumbar microdiscectomy 01/24/2013   Hypokalemia 12/24/2012   Sweating abnormality 10/19/2012   Dysautonomia orthostatic hypotension syndrome 08/16/2012   Hereditary and idiopathic peripheral neuropathy 08/16/2012   Chronic adrenal insufficiency (HCC) 08/16/2012   MS (multiple sclerosis) (HCC) 08/15/2012    ONSET DATE: 08-17-22  REFERRING DIAG: G35 (ICD-10-CM) - Multiple sclerosis   Diagnosis  S32.039D (ICD-10-CM) - Closed fracture of third lumbar vertebra with routine healing, unspecified fracture morphology, subsequent encounter    THERAPY DIAG:  Other abnormalities of gait and mobility  Unsteadiness on feet  BPPV (benign paroxysmal positional vertigo), right  Muscle weakness (generalized)  Rationale for Evaluation and Treatment: Rehabilitation  SUBJECTIVE:                                                                                                                                                                                             SUBJECTIVE STATEMENT:  Pt reports the dizziness is about the same as it was last week: pt reports she has had nausea about every day also; feels it is sometimes spinning and sometimes light-headedness. Has not taken Meclizine since last Thursday  Pt accompanied  by: self  PERTINENT HISTORY: spinal cord stimulator implant; MS diagnosed in 1984; s/p 2 back surgeries  Per Chart note Brief HPI:   Katrina Rivera is a 78 y.o. female with a history of relapsing remitting multiple sclerosis who was evaluated by Dr. Joshua regarding gait abnormality, chronic low back pain and status post lumbar fusion May 2024. She has reportedly fallen 4 times at home after PLIF of L3-5. She was admitted 08/17/2022 for scheduled surgery of L3 spinal fracture. She underwent posterior fixation L2-L4 using NuVasive cortical pedicle screws, arthrodesis and removal of segmental fixation L3-L5 by Dr. Joshua. PT OT evaluations carried out. Incision is healed without signs of infection. She is appropriate back soreness. She has no complaints of leg pain or new numbness, tingling or weakness per Dr. Tamala note today. Signs are stable and she is tolerating regular diet. Other past medical history includes restless leg syndrome, anxiety, headaches with migraine features, fibromyalgia, hypotension, anemia (acute on chronic).    PAIN:  Are you having pain? Yes: NPRS scale: 6/10 Pain location: low back pain into legs)  Pain description: tightness, throbbing, sore Aggravating factors: no specific Relieving factors: rest (TLSO has been discontinued by MD)   PRECAUTIONS: Back  - No bending, lifting >10#, twisting, reaching overhead; Dr Joshua responded with regular back precautions via inbasket message inquiring about precautions  RED FLAGS: None   WEIGHT BEARING RESTRICTIONS: No  FALLS: Has patient fallen in last 6 months? Yes. Number of falls 4  LIVING ENVIRONMENT: Lives with: lives with their spouse Lives in: House/apartment Stairs: Yes: External: 1 steps; planning on having rail installed Has following equipment at home: Walker - 2 wheeled, shower chair, and Grab bars  PLOF: Independent with household mobility with device, Requires assistive device for independence, and Needs assistance with ADLs  PATIENT GOALS: walk without walker and back brace and get back to where I was before  OBJECTIVE:   DIAGNOSTIC  FINDINGS: IMPRESSION: 1. Unhealed fracture of the posterosuperior L3 body, ans associated avulsed appearance of both L3 pedicles (series 7, image 30) along the course of the bilateral L3 pedicle screws. Mild retropulsion of the posterior body fragment does not result in significant spinal stenosis. 2. No other acute osseous abnormality identified in the Lumbar Spine. But scant arthrodesis limited to only the right L5-S1 posterior elements following L3 through S1 fusion. Some endplate subsidence at each level. 3.  Aortic Atherosclerosis (ICD10-I70.0).  Nephrolithiasis.  COGNITION: Overall cognitive status: Within functional limits for tasks assessed   SENSATION: Impaired sensation in arms and legs - numbness and tingling  COORDINATION: WFL's - slowed movement due to back pain  POSTURE: rounded shoulders and forward head  LOWER EXTREMITY ROM:   WFL's bil. LE's   LOWER EXTREMITY MMT:  grossly 4/5 throughout but pain with resistance due to back pain (surgical site)  BED MOBILITY:  Independent   TRANSFERS: Assistive device utilized: Environmental Consultant - 4 wheeled  Sit to stand: Modified independence Stand to sit: Modified independence  STAIRS:  TBA  GAIT: Gait pattern:  increased knee flexion bil. LE's in stance  and step through pattern Distance walked: 41' Assistive device utilized: Environmental Consultant - 2 wheeled Level of assistance: SBA Comments: pt amb. With bil. Knees flexed  FUNCTIONAL TESTS:  5 times sit to stand: 21.47 secs from chair without UE support with close CGA Timed up and go (TUG): 21.47 secs with RW 10 meter walk test: 21.56 = 1.52 ft/sec with RW Berg Balance Scale: to be assessed  -  pt stood for 2 without UE support with SBA  PATIENT SURVEYS:  N/A - dx is MS   TODAY'S TREATMENT:  02-14-23  NeuroRe-ed:  Rt Dix-Hallpike test - no nystagmus and no c/o spinning vertigo in test position  Pt reported moderate dizziness with return to upright seated position Reviewed  Brandt-Daroff exercises - emphasized need to discontinue exercises if dizziness or light-headedness increased with subsequent reps, as this was due to orthostatic hypotension  Discussed dizziness with pt - no spinning vertigo provoked in today's session but pt continues to c/o dizziness/light-headedness - appears to be due to orthostatic hypotension  5x sit to stand - score 17.87 secs from chair without UE support  TUG score 20.00 secs with RW   Gait:  Gait velocity - 23.63 secs = 1.39 ft/sec with RW  Self Care:  discussed progress towards goals, need for HEP consolidation and dizziness with possible causes of orthostatic hypotension, medication side effects, or MS related  PATIENT EDUCATION:  Education details: continue HEP, use RW at all times for safety; discussed etiology of BPPV; discussed Brandt-Daroff exercises as these were issued by her PCP - explained that she may have increased dizziness with return to upright due to the orthostatic hypotension  Person educated: Patient Education method: Explanation, Demonstration, Tactile cues, and Verbal cues Education comprehension: verbalized understanding, returned demonstration, verbal cues required, tactile cues required, and needs further education  HOME EXERCISE PROGRAM:  Access Code: [email or web address] (combined into one HEP 12/16/22) URL: [email or web address] Date: 12/16/2022 Prepared by: Waddell Southgate  Exercises - Ankle Dorsiflexion with Resistance  - 1 x daily - 7 x weekly - 3 sets - 10 reps - Gastroc Stretch on Wall  - 1 x daily - 7 x weekly - 3 sets - 10 reps - Supine Knee Extension Stretch on Towel Roll  - 1 x daily - 7 x weekly - Supine Quad Set  - 1 x daily - 7 x weekly - 3 sets - 10 reps - Sit to Stand with Arms Crossed  - 1 x daily - 7 x weekly - 3 sets - 10 reps - Supine Bridge  - 1 x daily - 7 x weekly - 3 sets - 10 reps - Side Stepping with Resistance at Ankles and Counter Support  - 1 x daily - 7 x weekly - 3  sets - 10 reps - Supine Hamstring Stretch  - 1 x daily - 7 x weekly - 1 sets - 2-3 reps - 60 sec hold - Single Leg Stance with Support  - 1 x daily - 7 x weekly - 1 sets - 2-3 reps - 10 sec hold - Alternating Step Forward with Support  - 1 x daily - 7 x weekly - 1 sets - 10 reps - Step Sideways  - 1 x daily - 7 x weekly - 1 sets - 10 reps - Alternating Step Backward with Support  - 1 x daily - 7 x weekly - 1 sets - 10 reps - Side Stepping with Counter Support  - 1 x daily - 7 x weekly - 1 sets - 2-3 reps - Standing March with Counter Support  - 1 x daily - 7 x weekly - 1 sets - 10 reps - Standing Hip Extension with Counter Support  - 1 x daily - 7 x weekly - 3 sets - 10 reps - Standing Knee Flexion with Counter Support  - 1 x daily - 7 x weekly - 3 sets - 10 reps -  Heel Raises with Counter Support  - 1 x daily - 7 x weekly - 3 sets - 10 reps - Standing Hip Abduction with Counter Support  - 1 x daily - 7 x weekly - 3 sets - 10 reps - Supine March  - 1 x daily - 7 x weekly - 3 sets - 10 reps - Supine Piriformis Stretch with Leg Straight  - 1 x daily - 7 x weekly - 1 sets - 3-5 reps - 30 sec hold - Supine Butterfly Groin Stretch  - 1 x daily - 7 x weekly - 1 sets - 3-5 reps - 30 sec hold  Added following stretches:  Medbridge  Access Code: [email or web address] URL: [email or web address] Date: 01/27/2023 Prepared by: Rock Kussmaul  Exercises - Single Knee to Chest Stretch  - 1 x daily - 7 x weekly - 1 sets - 1-2 reps - 15-20 hold - Supine Double Knee to Chest  - 1 x daily - 7 x weekly - 1 sets - 2 reps - 15-20 sec hold - Supine Lower Trunk Rotation  - 1 x daily - 7 x weekly - 1 sets - 2 reps - 15-20 sec hold   GOALS: Goals reviewed with patient? Yes   UPDATED LONG TERM GOALS: Target date: 02-17-23  (some initial LTG's revised)   Improve Berg score  >/= 48/56 to reduce fall risk & demo improved balance. Baseline: 30/56 (8/19); score 41/56 on 11-21-22, 45/56 (12/16/22); score 43/56 on  01-10-23 Goal status: Ongoing  2.  Pt will be modified independent with household ambulation without device. Baseline: pt reports she is walking short distances in home without use of RW, but still using RW for longer distances in the home Goal status: DEFERRED  3.  Improve 5x sit to stand score to </= 15 secs from chair without UE support without LOB to demo improved LE strength.  Baseline: 21.47 secs;  19.68 secs:   01-10-23   24.10 secs;   02-14-23:  17.78;  5x sit to stand 17.87 secs Goal status: Not met 02-14-23  4.  Improve TUG score to </= 17 secs with RW to demo improved functional mobility.  Baseline: 21.47 secs with RW;  21.12 secs:   20.41 secs,  18.81 secs without RW;   26.19 secs with RW (01-10-23);   21.56 secs with RW, 19.19 secs; 20.00 secs with RW Goal status:  Not met 02-14-23  5.   Increase gait speed to >/= 2.3 ft/sec with RW with pt demonstrating bil. Knee extension in stance.  Baseline: 1.52 ft/sec with RW (21.56 secs); 19.62,  20.00 = 1.67 ft/sec with RW;    01-12-23:  23.65, 23.19 secs = 1.41 ft/sec with RW:  23.63 secs = 1.39 ft/sec with RW Goal status: Not met 02-14-23  6.  Independent in updated HEP for balance and LE strengthening and core stabilization exercises. Baseline:  Goal status: NEW  7.  Pt will transfer floor to stand with UE support on mat table with SBA.  Baseline:  TBA  Goal status:  ONGOING   ASSESSMENT:  CLINICAL IMPRESSION:   This 30th visit progress note covers dates 01-09-23 - 02-14-23.  LTG #1 and 7 not assessed in today's session due to time constraint, but will be assessed at next scheduled PT session, which is planned D/C.  LTG #2 has been deferred due to safety concerns with pt having had several falls in past 6-8 weeks, with one of those falls being injurious.  Pt has not  met LTG's #3-5 as scores for 5x sit to stand, TUG score, and gait velocity remain essentially unchanged since previous goal assessment on 01-10-23.  Rt BPPV posterior canalithiasis  appears to be resolved at this time (Epley maneuver has been administered for treatment during 2 sessions).  Pt continues to c/o dizziness but this appears to be multi-factorial in etiology as pt has orthostati hypotension and also possible central dizziness related to MS disease process.  Plan is to discharge pt at next session due to plateau in maximizing functional status at this time.    OBJECTIVE IMPAIRMENTS: Abnormal gait, decreased activity tolerance, decreased balance, decreased strength, and pain.   ACTIVITY LIMITATIONS: carrying, lifting, bending, squatting, stairs, transfers, reach over head, and locomotion level  PARTICIPATION LIMITATIONS: meal prep, cleaning, laundry, shopping, community activity, and pt reports she does not drive  PERSONAL FACTORS: Past/current experiences, Time since onset of injury/illness/exacerbation, and 1 comorbidity: MS and s/p back surgery  are also affecting patient's functional outcome.   REHAB POTENTIAL: Good  CLINICAL DECISION MAKING: Evolving/moderate complexity  EVALUATION COMPLEXITY: Moderate  PLAN:  PT FREQUENCY: 2x/week  PT DURATION: 4 weeks (8 additional visits)  PLANNED INTERVENTIONS: Therapeutic exercises, Therapeutic activity, Neuromuscular re-education, Balance training, Gait training, Patient/Family education, Self Care, Stair training, DME instructions, and Aquatic Therapy  PLAN FOR NEXT SESSION:  Check LTG's #1, 6 and 7 - consolidate HEP    Morgen Linebaugh, [place], PT 02/15/2023, 6:02 PM   [facility] [address] [place], [place] [address] [phone]

## 2023-02-15 ENCOUNTER — Encounter: Payer: Self-pay | Admitting: Physical Medicine and Rehabilitation

## 2023-02-15 ENCOUNTER — Encounter: Payer: Self-pay | Admitting: Neurology

## 2023-02-15 ENCOUNTER — Ambulatory Visit: Payer: PPO | Admitting: Neurology

## 2023-02-15 ENCOUNTER — Encounter: Payer: Self-pay | Admitting: Physical Therapy

## 2023-02-15 ENCOUNTER — Encounter: Payer: PPO | Attending: Physical Medicine and Rehabilitation | Admitting: Physical Medicine and Rehabilitation

## 2023-02-15 VITALS — BP 158/69 | HR 91 | Ht 60.0 in | Wt 102.0 lb

## 2023-02-15 VITALS — BP 133/72 | HR 112 | Resp 16 | Ht 60.0 in | Wt 102.0 lb

## 2023-02-15 DIAGNOSIS — G2581 Restless legs syndrome: Secondary | ICD-10-CM

## 2023-02-15 DIAGNOSIS — G4452 New daily persistent headache (NDPH): Secondary | ICD-10-CM

## 2023-02-15 DIAGNOSIS — G43709 Chronic migraine without aura, not intractable, without status migrainosus: Secondary | ICD-10-CM

## 2023-02-15 DIAGNOSIS — M7918 Myalgia, other site: Secondary | ICD-10-CM | POA: Diagnosis not present

## 2023-02-15 DIAGNOSIS — I951 Orthostatic hypotension: Secondary | ICD-10-CM | POA: Diagnosis not present

## 2023-02-15 DIAGNOSIS — Z981 Arthrodesis status: Secondary | ICD-10-CM | POA: Diagnosis not present

## 2023-02-15 DIAGNOSIS — R42 Dizziness and giddiness: Secondary | ICD-10-CM | POA: Diagnosis not present

## 2023-02-15 DIAGNOSIS — G35 Multiple sclerosis: Secondary | ICD-10-CM

## 2023-02-15 DIAGNOSIS — G609 Hereditary and idiopathic neuropathy, unspecified: Secondary | ICD-10-CM | POA: Diagnosis not present

## 2023-02-15 MED ORDER — LIDOCAINE HCL 1 % IJ SOLN
9.0000 mL | Freq: Once | INTRAMUSCULAR | Status: AC
  Administered 2023-02-15: 9 mL

## 2023-02-15 MED ORDER — ONDANSETRON HCL 4 MG PO TABS: 4.0000 mg | ORAL_TABLET | Freq: Three times a day (TID) | ORAL | 3 refills | Status: AC | PRN

## 2023-02-15 MED ORDER — TOPIRAMATE 50 MG PO TABS: 50.0000 mg | ORAL_TABLET | Freq: Every day | ORAL | 5 refills | Status: AC

## 2023-02-15 NOTE — Progress Notes (Signed)
 Pt is a 78 yr old female with hx of L3 fx with L2-L4 fixation and removal of prior hardware- recent fusion prior to L3 fx; with hx of relapsing remitting MS; Migraines on CGRP injections and Nurtec; Hypotension; IBS and Neurogenic bladder due to MS as well as Addison's disease on medrol  daily. Did trigger point injections in hospital for pain and vertigo- surgery done in 08/2022.   Patient here for f/u on back L3 fx and trp injections.   Gets Oxycodone  from Dr Joshua Frank was first one in last 2 weeks.   Doesn't take often.    Went to ED 11/20 due to MS flare vs vertigo Says still dizzy as well   Has worked with PT on vertigo.    Stopped both Duloxetine  and Gabapentin - and started Horizant  ( Long acting gabapentin ?) Dizziness sometimes better, but not all the time.   BP is dropping 20 points when stands up.  And vertigo is due to BPPV- and did crystal realignment-    Got injection in lower lumbar back November- when fell and hit head- had 2 days of peace, and then had fall- so stopped working [employer] next month to get another injection.   Had a bad UTI at the time. Still not completely recovered from UTI, she thinks.    Has done Urodynamics at Urology- waiting to hear results. In February   Off the corset brace and still on RW- cannot go to cane, due to fall in November.   Taking tylenol - hurting- hard to sleep-   Has appt with PA at Neuro today.   Has chunk out of R arm and where hit arm- and R lower leg- got a chunk out of it when fell- seeing wound center later this month- un-nerved about more surgery.    Still having HA's daily-  Hasn't tried Topamax  or prevention-  Starts in front or back- has one no matter what- no light or sound sensitivity- but has associated dizziness and nausea.  Phenergan  doesn't help her nausea.    BO 158/66- today- still taking 1/2 dose Florinef -   Exam: Awake, alert, appropriate, accompanied by husband, using RW to walk- very slow gait,  NAD No nystagmus on exam, but didn't do BPPV Epley's maneuver  Dressing on R lower leg.    Plan:  1. Will order therapy to restart in mid Spring. Wait to reorder til sees again.    2. I suggest if needs to cath, cath- because can increase risk of kidney damage and might cause need for dialysis in the long run, much less increase your risk of UTIs. (Had back to back this year).    3.  Suggest taking tylenol  nightly and as needed to help pain.    4. Con't Oxy on rare days when hurts really bad- per Dr Joshua   5.  Suggest speaking to Neuro about doing BPPV testing again-  to see if that's the cause of vertigo vs MS issues.    6. Patient here for trigger point injections for  Consent done and on chart.  Cleaned areas with alcohol and injected using a 27 gauge 1.5 inch needle  Injected 6cc- wasted 1.5cc Using 1% Lidocaine  with no EPI  Upper traps B/L - On L did 3x Levators- B/L  Posterior scalenes Middle scalenes- B/L  Splenius Capitus- B/L  Pectoralis Major Rhomboids- B/L-  Infraspinatus Teres Major/minor Thoracic paraspinals Lumbar paraspinals- B/L- below stimulator- by ~ 3 inches Other injections-    Patient's level of  pain prior was- 8/10 Current level of pain after injections is- feels less tightness  There was no bleeding or complications.  Patient was advised to drink a lot of water on day after injections to flush system Will have increased soreness for 12-48 hours after injections.  Can use Lidocaine  patches the day AFTER injections Can use theracane on day of injections in places didn't inject Can use heating pad 4-6 hours AFTER injections  7. Will try  Zofran /Ondansetron  4 mg 3x/day as needed for  nausea-  8. Can try Topamax - topiramate  50 mg nightly x 1 week, then 75 mg nightly x 1 week, then 100 mg nightly- can rarely cause depression or nausea- but usually well tolerated.   9. Off gabapentin  and Duloxetine - due to dizziness  10. F/U q 6 weeks for  injections. And f/u on MS.   11. Con't Florinef - for orthostatic hypotension- don't increase, but don't stop   I spent a total of 41   minutes on total care today- >50% coordination of care- due to 10 minutes on injection-r est as detailed above.

## 2023-02-15 NOTE — Patient Instructions (Addendum)
 Continue with the Epley maneuver at home  Drink more water  I will get notes from Dr. Dione Booze  Try the Topamax for headache prevention.  Do you report about what it is next week update

## 2023-02-15 NOTE — Patient Instructions (Signed)
 Plan:  1. Will order therapy to restart in mid Spring. Wait to reorder til sees again.    2. I suggest if needs to cath, cath- because can increase risk of kidney damage and might cause need for dialysis in the long run, much less increase your risk of UTIs. (Had back to back this year).    3.  Suggest taking tylenol  nightly and as needed to help pain.    4. Con't Oxy on rare days when hurts really bad- per Dr Joshua   5.  Suggest speaking to Neuro about doing BPPV testing again-  to see if that's the cause of vertigo vs MS issues.    6. Patient here for trigger point injections for  Consent done and on chart.  Cleaned areas with alcohol and injected using a 27 gauge 1.5 inch needle  Injected 6cc- wasted 1.5cc Using 1% Lidocaine  with no EPI  Upper traps B/L - On L did 3x Levators- B/L  Posterior scalenes Middle scalenes- B/L  Splenius Capitus- B/L  Pectoralis Major Rhomboids- B/L-  Infraspinatus Teres Major/minor Thoracic paraspinals Lumbar paraspinals- B/L- below stimulator- by ~ 3 inches Other injections-    Patient's level of pain prior was- 8/10 Current level of pain after injections is- feels less tightness  There was no bleeding or complications.  Patient was advised to drink a lot of water on day after injections to flush system Will have increased soreness for 12-48 hours after injections.  Can use Lidocaine  patches the day AFTER injections Can use theracane on day of injections in places didn't inject Can use heating pad 4-6 hours AFTER injections  7. Will try  Zofran /Ondansetron  4 mg 3x/day as needed for  nausea-  8. Can try Topamax - topiramate  50 mg nightly x 1 week, then 75 mg nightly x 1 week, then 100 mg nightly- can rarely cause depression or nausea- but usually well tolerated.   9. Off gabapentin  and Duloxetine - due to dizziness  10. F/U q 6 weeks for injections. And f/u on MS.

## 2023-02-16 ENCOUNTER — Ambulatory Visit: Payer: PPO | Admitting: Physical Therapy

## 2023-02-16 DIAGNOSIS — H8111 Benign paroxysmal vertigo, right ear: Secondary | ICD-10-CM | POA: Diagnosis not present

## 2023-02-16 DIAGNOSIS — R2681 Unsteadiness on feet: Secondary | ICD-10-CM

## 2023-02-16 DIAGNOSIS — R2689 Other abnormalities of gait and mobility: Secondary | ICD-10-CM

## 2023-02-16 NOTE — Therapy (Signed)
 OUTPATIENT PHYSICAL THERAPY NEURO TREATMENT NOTE/DISCHARGE SUMMARY       Patient Name: Katrina Rivera MRN: [id number] DOB:[birth year], 78 y.o., female Today's Date: 02/17/2023    PCP: Arloa Elsie SAUNDERS, MD REFERRING PROVIDER: Pegge Toribio PARAS, PA-C  END OF SESSION:  PT End of Session - 02/17/23 1559     Visit Number 31    Number of Visits 30    Date for PT Re-Evaluation 02/17/23    Authorization Type [employer]    Authorization Time Period 09-20-22 - 12-08-22; 11-21-22 - 02-07-23;  01-12-23 - 03-10-23    Progress Note Due on Visit 30    PT Start Time 0846    PT Stop Time 0932    PT Time Calculation (min) 46 min    Equipment Utilized During Treatment Gait belt    Activity Tolerance Patient tolerated treatment well    Behavior During Therapy WFL for tasks assessed/performed                               Past Medical History:  Diagnosis Date   Addison's disease (HCC)    takes Solu Cortef  daily   Anemia    takes Ferrous Sulfate  daily   Anxiety    takes Xanax  nightly   Arthritis    Chronic back pain    stenosis   Depression    takes Cymbalta  daily   Dizziness    if b/p drops    Fibromyalgia    History of blood transfusion    no abnormal reaction noted   History of bronchitis    many yrs ago    Hypokalemia    takes Potassium daily   Hypotension    takes Florinef  daily   IBS (irritable bowel syndrome)    takes Align daily   Insomnia    takes Trazodone  nightly   Joint pain    Multiple sclerosis (HCC)    doesn't take any meds   Multiple sclerosis (HCC)    New onset headache 12/22/2021   Nocturia    Osteoporosis    Palpitations    Peripheral neuropathy    Primary localized osteoarthritis of left knee 08/11/2020   Restless leg syndrome    Seasonal allergies    takes Zyrtec daily;uses Flonase  daily as needed   Syncope    when missed methylprednisolone  dose   Urinary frequency    takes Flomax  daily   Weakness    numbness and  tingling   Past Surgical History:  Procedure Laterality Date   ABDOMINAL HYSTERECTOMY  02/07/1986   APPENDECTOMY  02/07/1986   CESAREAN SECTION  1973/1977   x2   CHOLECYSTECTOMY  02/08/1995   COLECTOMY  02/08/1988   COLONOSCOPY     ESOPHAGOGASTRODUODENOSCOPY     EYE SURGERY     bilateral - /w IOL- cataracts   FRACTURE SURGERY Right    rods and screws-right leg   LUMBAR LAMINECTOMY/DECOMPRESSION MICRODISCECTOMY Left 01/24/2013   Procedure: LUMBAR FIVE TO SACRAL ONE LUMBAR LAMINECTOMY/DECOMPRESSION MICRODISCECTOMY 1 LEVEL;  Surgeon: Alm GORMAN Molt, MD;  Location: [facility];  Service: Neurosurgery;  Laterality: Left;   MAXIMUM ACCESS (MAS)POSTERIOR LUMBAR INTERBODY FUSION (PLIF) 1 LEVEL N/A 06/18/2014   Procedure: MAXIMUM ACCESS SURGERY POSTERIOR LUMBAR INTERBODY FUSION LUMBAR FIVE TO SACRAL ONE ;  Surgeon: Alm GORMAN Molt, MD;  Location: [facility];  Service: Neurosurgery;  Laterality: N/A;   SPINAL CORD STIMULATOR BATTERY EXCHANGE Right 07/30/2021   Procedure: Spinal  cord stimulator battery replacement;  Surgeon: Joshua Alm RAMAN, MD;  Location: [facility];  Service: Neurosurgery;  Laterality: Right;   SPINAL CORD STIMULATOR IMPLANT     TOTAL KNEE ARTHROPLASTY Left 08/24/2020   Procedure: TOTAL KNEE ARTHROPLASTY;  Surgeon: Jane Charleston, MD;  Location: [facility];  Service: Orthopedics;  Laterality: Left;   Patient Active Problem List   Diagnosis Date Noted   Myofascial pain dysfunction syndrome 11/16/2022   Vertigo 11/16/2022   Lumbar burst fracture (HCC) 08/23/2022   Lumbar vertebral fracture (HCC) 08/19/2022   Chronic migraine w/o aura w/o status migrainosus, not intractable 03/29/2022   Mixed hyperlipidemia 12/27/2021   Chest pain of uncertain etiology 12/27/2021   New onset headache 12/22/2021   S/P lumbar fusion 07/30/2021   Primary localized osteoarthritis of left knee 08/11/2020   Atrophic vaginitis 08/11/2020   Menopausal symptom 08/11/2020   Restless leg syndrome    Chronic  back pain    Low back pain without sciatica 11/01/2018   Essential hypertension    SBO (small bowel obstruction) (HCC) 09/01/2016   Fibromyalgia    Abdominal pain, vomiting, and diarrhea    Dehydration    Somnolence, daytime 10/22/2015   Fatigue 10/22/2015   Chronic leg pain 06/29/2015   S/P lumbar spinal fusion 06/18/2014   Abnormality of gait 02/18/2013   S/P lumbar microdiscectomy 01/24/2013   Hypokalemia 12/24/2012   Sweating abnormality 10/19/2012   Dysautonomia orthostatic hypotension syndrome 08/16/2012   Hereditary and idiopathic peripheral neuropathy 08/16/2012   Chronic adrenal insufficiency (HCC) 08/16/2012   MS (multiple sclerosis) (HCC) 08/15/2012    ONSET DATE: 08-17-22  REFERRING DIAG: G35 (ICD-10-CM) - Multiple sclerosis   Diagnosis  S32.039D (ICD-10-CM) - Closed fracture of third lumbar vertebra with routine healing, unspecified fracture morphology, subsequent encounter    THERAPY DIAG:  Other abnormalities of gait and mobility  Unsteadiness on feet  Rationale for Evaluation and Treatment: Rehabilitation  SUBJECTIVE:                                                                                                                                                                                             SUBJECTIVE STATEMENT:  Pt reports she had appts yesterday (on Wed., the 8th) with neurologist (saw Lauraine, NP) and with Dr. Lovorn.  Says Dr. Cornelio wants her to start doing self catheterization due to having had several UTI's. Also had appt with Dr. Octavia - told him she had been having double vision - he mentioned possible stroke which she says has really stressed her out - was unable to sleep well last night due to stress and anxiety; says she  is going to call her MD and ask for something for my nerves.  Pt continues to c/o dizziness but says it is not spinning like it was;  message by PT was sent to Lauraine Born responding about vestibular rehab - pt's dizziness  appears to be multi-factorial in etiology - pt has orthostatic hypotension which contributes greatly to her dizziness which occurs with change in position  Pt accompanied by: self  PERTINENT HISTORY: spinal cord stimulator implant; MS diagnosed in 1984; s/p 2 back surgeries  Per Chart note Brief HPI:   Katrina Rivera is a 78 y.o. female with a history of relapsing remitting multiple sclerosis who was evaluated by Dr. Joshua regarding gait abnormality, chronic low back pain and status post lumbar fusion May 2024. She has reportedly fallen 4 times at home after PLIF of L3-5. She was admitted 08/17/2022 for scheduled surgery of L3 spinal fracture. She underwent posterior fixation L2-L4 using NuVasive cortical pedicle screws, arthrodesis and removal of segmental fixation L3-L5 by Dr. Joshua. PT OT evaluations carried out. Incision is healed without signs of infection. She is appropriate back soreness. She has no complaints of leg pain or new numbness, tingling or weakness per Dr. Tamala note today. Signs are stable and she is tolerating regular diet. Other past medical history includes restless leg syndrome, anxiety, headaches with migraine features, fibromyalgia, hypotension, anemia (acute on chronic).    PAIN:  Are you having pain? Yes: NPRS scale: 6/10 Pain location: low back pain into legs)  Pain description: tightness, throbbing, sore Aggravating factors: no specific Relieving factors: rest (TLSO has been discontinued by MD)   PRECAUTIONS: Back  - No bending, lifting >10#, twisting, reaching overhead; Dr Joshua responded with regular back precautions via inbasket message inquiring about precautions  RED FLAGS: None   WEIGHT BEARING RESTRICTIONS: No  FALLS: Has patient fallen in last 6 months? Yes. Number of falls 4  LIVING ENVIRONMENT: Lives with: lives with their spouse Lives in: House/apartment Stairs: Yes: External: 1 steps; planning on having rail installed Has following  equipment at home: Walker - 2 wheeled, shower chair, and Grab bars  PLOF: Independent with household mobility with device, Requires assistive device for independence, and Needs assistance with ADLs  PATIENT GOALS: walk without walker and back brace and get back to where I was before  OBJECTIVE:   DIAGNOSTIC FINDINGS: IMPRESSION: 1. Unhealed fracture of the posterosuperior L3 body, ans associated avulsed appearance of both L3 pedicles (series 7, image 30) along the course of the bilateral L3 pedicle screws. Mild retropulsion of the posterior body fragment does not result in significant spinal stenosis. 2. No other acute osseous abnormality identified in the Lumbar Spine. But scant arthrodesis limited to only the right L5-S1 posterior elements following L3 through S1 fusion. Some endplate subsidence at each level. 3.  Aortic Atherosclerosis (ICD10-I70.0).  Nephrolithiasis.  COGNITION: Overall cognitive status: Within functional limits for tasks assessed   SENSATION: Impaired sensation in arms and legs - numbness and tingling  COORDINATION: WFL's - slowed movement due to back pain  POSTURE: rounded shoulders and forward head  LOWER EXTREMITY ROM:   WFL's bil. LE's   LOWER EXTREMITY MMT:  grossly 4/5 throughout but pain with resistance due to back pain (surgical site)  BED MOBILITY:  Independent   TRANSFERS: Assistive device utilized: Environmental Consultant - 4 wheeled  Sit to stand: Modified independence Stand to sit: Modified independence  STAIRS:  TBA  GAIT: Gait pattern:  increased knee flexion bil. LE's in stance  and step through pattern Distance walked: 78' Assistive device utilized: Environmental Consultant - 2 wheeled Level of assistance: SBA Comments: pt amb. With bil. Knees flexed  FUNCTIONAL TESTS:  5 times sit to stand: 21.47 secs from chair without UE support with close CGA Timed up and go (TUG): 21.47 secs with RW 10 meter walk test: 21.56 = 1.52 ft/sec with RW Berg Balance Scale:  to be assessed  - pt stood for 2 without UE support with SBA  PATIENT SURVEYS:  N/A - dx is MS   TODAY'S TREATMENT:  02-16-23  NeuroRe-ed:  Reviewed standing balance exercises for HEP - performed at counter for UE support for safety; standing forward, back and side kicks; marching; sidestepping; and SLS on each leg Sit to stand without UE support from chair  Continued to discuss dizziness with pt again - no spinning vertigo provoked in today's session but pt continues to c/o dizziness/light-headedness - appears to be due to orthostatic hypotension or of central etiology due to MS  LTG assessment: 5x sit to stand - score 12.38 secs from chair without UE support  TUG score 18.25 secs with RW   Gait:  Gait velocity - 18.84 secs =  1.74 ft/sec with RW    Self Care:  discussed progress towards goals with D/C today as planned:  consolidated and revised and categorized pt's exercises for HEP - grouped exercises into 1) stretches for low back, 2) strengthening exs for bil. LE's and 3) standing balance exercises   PATIENT EDUCATION:  Education details: continue HEP, use RW at all times for safety; discussed etiology of BPPV; discussed Brandt-Daroff exercises as these were issued by her PCP - explained that she may have increased dizziness with return to upright due to the orthostatic hypotension  Person educated: Patient Education method: Explanation, Demonstration, Tactile cues, and Verbal cues Education comprehension: verbalized understanding, returned demonstration, verbal cues required, tactile cues required, and needs further education  HOME EXERCISE PROGRAM:  Access Code: [email or web address] (combined into one HEP 12/16/22) URL: [email or web address] Date: 12/16/2022 Prepared by: Waddell Southgate  Exercises - Ankle Dorsiflexion with Resistance  - 1 x daily - 7 x weekly - 3 sets - 10 reps - Gastroc Stretch on Wall  - 1 x daily - 7 x weekly - 3 sets - 10 reps - Supine Knee Extension  Stretch on Towel Roll  - 1 x daily - 7 x weekly - Supine Quad Set  - 1 x daily - 7 x weekly - 3 sets - 10 reps - Sit to Stand with Arms Crossed  - 1 x daily - 7 x weekly - 3 sets - 10 reps - Supine Bridge  - 1 x daily - 7 x weekly - 3 sets - 10 reps - Side Stepping with Resistance at Ankles and Counter Support  - 1 x daily - 7 x weekly - 3 sets - 10 reps - Supine Hamstring Stretch  - 1 x daily - 7 x weekly - 1 sets - 2-3 reps - 60 sec hold - Single Leg Stance with Support  - 1 x daily - 7 x weekly - 1 sets - 2-3 reps - 10 sec hold - Alternating Step Forward with Support  - 1 x daily - 7 x weekly - 1 sets - 10 reps - Step Sideways  - 1 x daily - 7 x weekly - 1 sets - 10 reps - Alternating Step Backward with Support  - 1 x daily - 7 x weekly - 1  sets - 10 reps - Side Stepping with Counter Support  - 1 x daily - 7 x weekly - 1 sets - 2-3 reps - Standing March with Counter Support  - 1 x daily - 7 x weekly - 1 sets - 10 reps - Standing Hip Extension with Counter Support  - 1 x daily - 7 x weekly - 3 sets - 10 reps - Standing Knee Flexion with Counter Support  - 1 x daily - 7 x weekly - 3 sets - 10 reps - Heel Raises with Counter Support  - 1 x daily - 7 x weekly - 3 sets - 10 reps - Standing Hip Abduction with Counter Support  - 1 x daily - 7 x weekly - 3 sets - 10 reps - Supine March  - 1 x daily - 7 x weekly - 3 sets - 10 reps - Supine Piriformis Stretch with Leg Straight  - 1 x daily - 7 x weekly - 1 sets - 3-5 reps - 30 sec hold - Supine Butterfly Groin Stretch  - 1 x daily - 7 x weekly - 1 sets - 3-5 reps - 30 sec hold  Added following stretches:  [employer]  Access Code: [email or web address] URL: [email or web address] Date: 01/27/2023 Prepared by: Rock Kussmaul  Exercises - Single Knee to Chest Stretch  - 1 x daily - 7 x weekly - 1 sets - 1-2 reps - 15-20 hold - Supine Double Knee to Chest  - 1 x daily - 7 x weekly - 1 sets - 2 reps - 15-20 sec hold - Supine Lower Trunk Rotation  - 1  x daily - 7 x weekly - 1 sets - 2 reps - 15-20 sec hold   GOALS: Goals reviewed with patient? Yes   UPDATED LONG TERM GOALS: Target date: 02-17-23  (some initial LTG's revised)   Improve Berg score  >/= 48/56 to reduce fall risk & demo improved balance. Baseline: 30/56 (8/19); score 41/56 on 11-21-22, 45/56 (12/16/22); score 43/56 on 01-10-23 Goal status: Deferred retesting on 02-16-23  2.  Pt will be modified independent with household ambulation without device. Baseline: pt reports she is walking short distances in home without use of RW, but still using RW for longer distances in the home Goal status: DEFERRED due to safety concerns  3.  Improve 5x sit to stand score to </= 15 secs from chair without UE support without LOB to demo improved LE strength.  Baseline: 21.47 secs;  19.68 secs:   01-10-23   24.10 secs;   02-14-23:  17.78;  5x sit to stand 17.87 secs;    02-16-23 =  12.38 secs Goal status: Goal met 02-16-23  4.  Improve TUG score to </= 17 secs with RW to demo improved functional mobility.  Baseline: 21.47 secs with RW;  21.12 secs:   20.41 secs,  18.81 secs without RW;   26.19 secs with RW (01-10-23);   21.56 secs with RW, 19.19 secs; 20.00 secs with RW;    02-16-23   = 18.25 secs Goal status:  Not met 02-14-23  5.   Increase gait speed to >/= 2.3 ft/sec with RW with pt demonstrating bil. Knee extension in stance.  Baseline: 1.52 ft/sec with RW (21.56 secs); 19.62,  20.00 = 1.67 ft/sec with RW;    01-12-23:  23.65, 23.19 secs = 1.41 ft/sec with RW:   02-14-23= 23.63 secs = 1.39 ft/sec with RW;   02-16-23 =   18.84 secs =  1.74  ft/sec with RW   Goal status: Not met 02-14-23  6.  Independent in updated HEP for balance and LE strengthening and core stabilization exercises. Baseline:  Goal status: MET 02-16-23  7.  Pt will transfer floor to stand with UE support on mat table with SBA.  Baseline:  TBA  Goal status:  DEFERRED due to wound on RLE    ASSESSMENT:  CLINICAL IMPRESSION:   PT session  focused on LTG assessment for planned discharge.  LTG's #1, 2 and 7 have been deferred;  pt has met LTG's #3 and 6.  LTG's #4 and 5 have not been met due to TUG score 18.25 secs with RW (goal set at </= 17 secs) and decreased gait speed at 1.74 ft/sec with RW (goal set at >/= 2.3 ft/sec with RW).  Pt has plateaued in maximizing functional progress at this time.  RW continues to be needed for assistance with ambulation to reduce fall risk and to increase safety with mobility.  Pt continues to c/o dizziness which appears to be multi-factorial in etiology at this time.  Pt did have BPPV but was treated with Epley maneuver and this has resolved as of current time.    OBJECTIVE IMPAIRMENTS: Abnormal gait, decreased activity tolerance, decreased balance, decreased strength, and pain.   ACTIVITY LIMITATIONS: carrying, lifting, bending, squatting, stairs, transfers, reach over head, and locomotion level  PARTICIPATION LIMITATIONS: meal prep, cleaning, laundry, shopping, community activity, and pt reports she does not drive  PERSONAL FACTORS: Past/current experiences, Time since onset of injury/illness/exacerbation, and 1 comorbidity: MS and s/p back surgery  are also affecting patient's functional outcome.   REHAB POTENTIAL: Good  CLINICAL DECISION MAKING: Evolving/moderate complexity  EVALUATION COMPLEXITY: Moderate  PLAN:  PT FREQUENCY: 2x/week  PT DURATION: 4 weeks (8 additional visits)  PLANNED INTERVENTIONS: Therapeutic exercises, Therapeutic activity, Neuromuscular re-education, Balance training, Gait training, Patient/Family education, Self Care, Stair training, DME instructions, and Aquatic Therapy  PLAN FOR NEXT SESSION:  D/C on 02-16-23   PHYSICAL THERAPY DISCHARGE SUMMARY  Visits from Start of Care: 31  Current functional level related to goals / functional outcomes: See above for progress towards goals   Remaining deficits: Continued decreased standing balance and dependency  with gait, with pt requiring use of RW to reduce fall risk with ambulation Continued c/o dizziness - BPPV has resolved with Epley maneuver treatment; dizziness at this time appears to be due to orthostatic hypotension and multi-factorial in etiology Cont. LE weakness   Education / Equipment: Pt has been instructed in HEP for strengthening, low back stretches and standing balance exercises   Patient agrees to discharge. Patient goals were partially met. Patient is being discharged due to lack of progress. and end of this certification period - pt has plateaued in maximizing functional progress at this time.  Pt has been informed that she may return to PT in the future (recommend in 5-6 months unless change in current functional status occurs, necessitating an earlier return).  Pt verbalizes understanding & agreement with this plan.     Ted, Leonhart, PT 02/17/2023, 4:04 PM   [facility] [address], [address] [place], [place] [address] [phone]

## 2023-02-17 ENCOUNTER — Encounter: Payer: Self-pay | Admitting: Physical Therapy

## 2023-02-20 ENCOUNTER — Telehealth: Payer: Self-pay

## 2023-02-20 ENCOUNTER — Other Ambulatory Visit: Payer: PPO

## 2023-02-20 DIAGNOSIS — I951 Orthostatic hypotension: Secondary | ICD-10-CM

## 2023-02-20 DIAGNOSIS — E2749 Other adrenocortical insufficiency: Secondary | ICD-10-CM

## 2023-02-20 NOTE — Telephone Encounter (Signed)
 Orders Placed This Encounter  Procedures   Renal function panel   Parathyroid hormone, intact (no Ca)    Please send to [facility].   Renin   VITAMIN D 25 Hydroxy (Vit-D Deficiency, Fractures)

## 2023-02-24 ENCOUNTER — Ambulatory Visit: Payer: PPO | Admitting: Endocrinology

## 2023-02-24 ENCOUNTER — Encounter: Payer: Self-pay | Admitting: Endocrinology

## 2023-02-24 VITALS — BP 150/70 | HR 66 | Resp 20 | Ht 60.0 in | Wt 100.4 lb

## 2023-02-24 DIAGNOSIS — E2749 Other adrenocortical insufficiency: Secondary | ICD-10-CM | POA: Diagnosis not present

## 2023-02-24 DIAGNOSIS — N3020 Other chronic cystitis without hematuria: Secondary | ICD-10-CM | POA: Diagnosis not present

## 2023-02-24 DIAGNOSIS — E673 Hypervitaminosis D: Secondary | ICD-10-CM

## 2023-02-24 DIAGNOSIS — I951 Orthostatic hypotension: Secondary | ICD-10-CM

## 2023-02-24 MED ORDER — FLUDROCORTISONE ACETATE 0.1 MG PO TABS: ORAL_TABLET | ORAL | 3 refills | Status: DC

## 2023-02-24 MED ORDER — METHYLPREDNISOLONE 4 MG PO TABS: ORAL_TABLET | ORAL | 3 refills | Status: DC

## 2023-02-24 NOTE — Progress Notes (Signed)
Outpatient Endocrinology Note Katrina Bayley Hurn, MD  02/24/23  Patient's Name: Katrina Rivera    DOB: Aug 11, 1945    MRN: [id number]  REASON OF VISIT: Follow up of  adrenal insufficiency/dysautonomic orthostatic hypotension/hypercalcemia.  PCP: Noberto Retort, MD  HISTORY OF PRESENT ILLNESS:   ROSALYND Rivera is a 78 y.o. old female with past medical history listed below, is here for follow up of  adrenal insufficiency / dysautonomic orthostatic hypotension / hypercalcemia..   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  He was diagnosed with dysautonomia and had multiple other problems related to autonomic neuropathy.  She has been on Florinef since 2004, previously she had taken 3 tablets daily along with potassium supplement.  She had tried midodrine in the past in 2014 and this was later stopped when blood pressure increased.  Florinef was adjusted multiple times in the past for regulating her blood pressure.  Most recently she has been taking Florinef 0.05 mg daily.  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  Prior testing included cosyntropin stimulation test showing a stimulated cortisol level of 15.7 and baseline cortisol 3.9.  Confirmed by 24-hour urine free cortisol which was only 3.0.  She was symptomatic with weight loss, decreased appetite, nausea and diarrhea.  Cosyntropin stimulation test in February 2016 showed baseline cortisol level of 0.3 and post injection of 1.1 only. She had GI effects from hydrocortisone and prednisone, was in the past using hydrocortisone injection using insulin syringe. Most recently she has been using methylprednisolone twice a day 4 mg in the morning and 2 mg in the afternoon, previously not able to taper the dose down.  Hypercalcemia: She has intermittent hypercalcemia since at least 2018, her PTH level was 31 previously and subsequent 23 and  38.  1, 25 vitamin D level was normal.  Serum calcium was as high as 11.2 and mostly intermittently mildly elevated.  She has been taking calcium and vitamin D supplement for osteoporosis.  # Osteoporosis managed by orthopedic surgeon/primary care provider on Prolia 60 mg every 6 months.  Other: Patient has multiple sclerosis.  Interval history Patient has been taking methylprednisolone.  She has been taking Florinef half tablet daily.  In November patient had fall at home, evaluated in the ER and CT trauma imaging was negative.  She has MS.  She gets occasional lightheadedness and weakness.  She has been doing physical therapy as well.  Recent labs reviewed normal serum sodium, potassium.  Elevated serum calcium.  She also has high vitamin D level.  She has normal PTH.  Patient reports she has been taking multivitamin 1 tablet daily however does not take additional vitamin D supplement.  No other complaints today.  REVIEW OF SYSTEMS:  As per history of present illness.   PAST MEDICAL HISTORY: Past Medical History:  Diagnosis Date   Addison's disease (HCC)    takes Solu Cortef daily   Anemia    takes Ferrous Sulfate daily   Anxiety    takes Xanax nightly   Arthritis    Chronic back pain    stenosis   Depression    takes Cymbalta daily   Dizziness    if b/p drops    Fibromyalgia    History of blood transfusion    no abnormal reaction noted   History of bronchitis    many yrs ago    Hypokalemia    takes Potassium daily   Hypotension  takes Florinef daily   IBS (irritable bowel syndrome)    takes Align daily   Insomnia    takes Trazodone nightly   Joint pain    Multiple sclerosis (HCC)    doesn't take any meds   Multiple sclerosis (HCC)    New onset headache 12/22/2021   Nocturia    Osteoporosis    Palpitations    Peripheral neuropathy    Primary localized osteoarthritis of left knee 08/11/2020   Restless leg syndrome    Seasonal allergies    takes Zyrtec  daily;uses Flonase daily as needed   Syncope    when missed methylprednisolone dose   Urinary frequency    takes Flomax daily   Weakness    numbness and tingling    PAST SURGICAL HISTORY: Past Surgical History:  Procedure Laterality Date   ABDOMINAL HYSTERECTOMY  02/07/1986   APPENDECTOMY  02/07/1986   CESAREAN SECTION  1973/1977   x2   CHOLECYSTECTOMY  02/08/1995   COLECTOMY  02/08/1988   COLONOSCOPY     ESOPHAGOGASTRODUODENOSCOPY     EYE SURGERY     bilateral - /w IOL- cataracts   FRACTURE SURGERY Right    rods and screws-right leg   LUMBAR LAMINECTOMY/DECOMPRESSION MICRODISCECTOMY Left 01/24/2013   Procedure: LUMBAR FIVE TO SACRAL ONE LUMBAR LAMINECTOMY/DECOMPRESSION MICRODISCECTOMY 1 LEVEL;  Surgeon: Tia Alert, MD;  Location: [facility];  Service: Neurosurgery;  Laterality: Left;   MAXIMUM ACCESS (MAS)POSTERIOR LUMBAR INTERBODY FUSION (PLIF) 1 LEVEL N/A 06/18/2014   Procedure: MAXIMUM ACCESS SURGERY POSTERIOR LUMBAR INTERBODY FUSION LUMBAR FIVE TO SACRAL ONE ;  Surgeon: Tia Alert, MD;  Location: [facility];  Service: Neurosurgery;  Laterality: N/A;   SPINAL CORD STIMULATOR BATTERY EXCHANGE Right 07/30/2021   Procedure: Spinal cord stimulator battery replacement;  Surgeon: Tia Alert, MD;  Location: [facility] OR;  Service: Neurosurgery;  Laterality: Right;   SPINAL CORD STIMULATOR IMPLANT     TOTAL KNEE ARTHROPLASTY Left 08/24/2020   Procedure: TOTAL KNEE ARTHROPLASTY;  Surgeon: Salvatore Marvel, MD;  Location: [facility];  Service: Orthopedics;  Laterality: Left;    ALLERGIES: Allergies  Allergen Reactions   Amoxicillin Other (See Comments)    Upset stomach   Aspirin     bruising   Azithromycin     stomach upset   Doxycycline Hyclate     GI upset   Hydroxyzine     Other Reaction(s): Hallucinations, Other   Hydroxyzine Hcl Other (See Comments)   Buspirone Palpitations   Buspirone Hcl Palpitations   Mirtazapine Palpitations    Weight gain    FAMILY HISTORY:   Family History  Problem Relation Age of Onset   Hypertension Mother    Stroke Mother    Heart attack Father    Tremor Brother     SOCIAL HISTORY: Social History   Socioeconomic History   Marital status: Married    Spouse name: [name]   Number of children: 2   Years of education: 12   Highest education level: Not on file  Occupational History    Employer: DISABLED    Comment: Disabled  Tobacco Use   Smoking status: Never   Smokeless tobacco: Never  Vaping Use   Vaping status: Never Used  Substance and Sexual Activity   Alcohol use: Never   Drug use: Never   Sexual activity: Not on file  Other Topics Concern   Not on file  Social History Narrative   Pt lives at home with spouse. Gabriel Rung) [phone] (patient's  cell)   [name]'s cell  [phone]      Caffeine Use: 1 cups daily.Right handed.Disabled.Education - high schoolPatient has two adult children.   Social Drivers of Corporate investment banker Strain: Not on file  Food Insecurity: No Food Insecurity (08/17/2022)   Hunger Vital Sign    Worried About Running Out of Food in the Last Year: Never true    Ran Out of Food in the Last Year: Never true  Transportation Needs: Not on file  Physical Activity: Not on file  Stress: Not on file  Social Connections: Not on file    MEDICATIONS:  Current Outpatient Medications  Medication Sig Dispense Refill   acetaminophen (TYLENOL) 325 MG tablet Take 1-2 tablets (325-650 mg total) by mouth every 4 (four) hours as needed for mild pain.     albuterol (VENTOLIN HFA) 108 (90 Base) MCG/ACT inhaler Inhale 2 puffs into the lungs every 6 (six) hours as needed for wheezing.     alfuzosin (UROXATRAL) 10 MG 24 hr tablet Take 10 mg by mouth daily with breakfast.     amLODipine (NORVASC) 2.5 MG tablet Take 1 tablet (2.5 mg total) by mouth daily. 30 tablet 0   bethanechol (URECHOLINE) 10 MG tablet Take 10 mg by mouth 2 (two) times daily.     buPROPion (WELLBUTRIN XL) 300 MG 24 hr tablet Take  300 mg by mouth every morning.     cephALEXin (KEFLEX) 250 MG capsule Take 250 mg by mouth every other day.     cholecalciferol (VITAMIN D3) 25 MCG (1000 UNIT) tablet Take 1,000 Units by mouth every morning.     clonazePAM (KLONOPIN) 0.5 MG tablet Take 1 tablet (0.5 mg total) by mouth at bedtime as needed for anxiety. (Patient taking differently: Take 0.5 mg by mouth at bedtime.) 30 tablet 1   CRANBERRY PO Take 1 tablet by mouth 2 (two) times daily.     dicyclomine (BENTYL) 20 MG tablet Take 1 tablet by mouth 4 (four) times daily as needed for spasms.     ferrous sulfate 325 (65 FE) MG tablet Take 325 mg by mouth daily with breakfast.     fluticasone (CUTIVATE) 0.05 % cream Apply 1 application  topically 2 (two) times daily as needed for irritation (Rosacea).     fluticasone (FLONASE) 50 MCG/ACT nasal spray Place 1 spray into both nostrils daily as needed for allergies or rhinitis.     Gabapentin Enacarbil (HORIZANT PO) Take by mouth.     HYDROcodone-acetaminophen (NORCO) 7.5-325 MG tablet Take 1 tablet by mouth every 4 (four) hours as needed for moderate pain ((score 4 to 6)). 30 tablet 0   HYDROcodone-acetaminophen (NORCO/VICODIN) 5-325 MG tablet Take 1 tablet by mouth every 8 (eight) hours as needed for moderate pain (pain score 4-6).     Multiple Vitamin (MULTIVITAMIN) tablet Take 1 tablet by mouth in the morning.     ondansetron (ZOFRAN) 4 MG tablet Take 1 tablet (4 mg total) by mouth every 8 (eight) hours as needed for nausea or vomiting. 90 tablet 3   oxyCODONE-acetaminophen (PERCOCET) 10-325 MG tablet Take 1 tablet by mouth every 4 (four) hours as needed for pain.     oxyCODONE-acetaminophen (PERCOCET/ROXICET) 5-325 MG tablet Take 1 tablet by mouth every 6 (six) hours as needed for severe pain (pain score 7-10).     pantoprazole (PROTONIX) 20 MG tablet Take 20 mg by mouth every morning.     Polyethyl Glycol-Propyl Glycol (SYSTANE OP) Place 2 drops into both  eyes daily as needed (for dry  eyes).     polyethylene glycol (MIRALAX) 17 g packet Take 17 g by mouth 2 (two) times daily. 17 grams in 6 oz of favorite drink twice a day until bowel movement.  LAXITIVE.  Restart if two days since last bowel movement (Patient taking differently: Take 17 g by mouth daily as needed for moderate constipation.) 14 packet 0   Probiotic Product (PROBIOTIC PO) Take 1 capsule by mouth every morning.     promethazine (PHENERGAN) 25 MG tablet Take 25 mg by mouth every 6 (six) hours as needed for nausea or vomiting.     tiZANidine (ZANAFLEX) 2 MG tablet Take 2 tablets (4 mg total) by mouth every 8 (eight) hours as needed for muscle spasms. (Patient taking differently: Take 4 mg by mouth at bedtime.) 60 tablet 1   topiramate (TOPAMAX) 50 MG tablet Take 1 tablet (50 mg total) by mouth at bedtime. X 1 week, then 75 mg nightly x 1 week, ,then 100 mg at bedtime- for prevention of headaches 60 tablet 5   DULoxetine (CYMBALTA) 60 MG capsule Take 1 capsule (60 mg total) by mouth daily. (Patient not taking: Reported on 02/24/2023) 90 capsule 3   fludrocortisone (FLORINEF) 0.1 MG tablet Take 1/2 tablet daily. 45 tablet 3   gabapentin (NEURONTIN) 300 MG capsule Take 3 capsules (900 mg total) by mouth at bedtime. (Patient not taking: Reported on 02/24/2023)     gabapentin (NEURONTIN) 300 MG capsule Take 1 capsule (300 mg total) by mouth daily at 6 (six) AM. (Patient not taking: Reported on 02/24/2023)     methylPREDNISolone (MEDROL) 4 MG tablet as directed Orally 1 tablet AM and 1/2 tablet afternoon and take additional in case of illness as instructed. 160 tablet 3   No current facility-administered medications for this visit.    PHYSICAL EXAM: Vitals:   02/24/23 1013  BP: (!) 150/70  Pulse: 66  Resp: 20  SpO2: 99%  Weight: 100 lb 6.4 oz (45.5 kg)  Height: 5' (1.524 m)   Body mass index is 19.61 kg/m.   General: Well developed, well nourished female in no apparent distress. HEENT: AT/[place], no external lesions.  Hearing intact to the spoken word Eyes: Conjunctiva clear and no icterus. Neck: Trachea midline, neck supple  Abdomen: Soft, non tender Neurologic: Alert, oriented, normal speech Extremities: trace pedal pitting edema.  Right leg wound with bandage. Skin: Warm, color good.  Psychiatric: Does not appear depressed or anxious  PERTINENT HISTORIC LABORATORY AND IMAGING STUDIES:  All pertinent laboratory results were reviewed. Please see HPI for further details.  Lab Results  Component Value Date   CO2 26 02/20/2023   CL 104 02/20/2023   NA 140 02/20/2023   GLUCOSE 89 02/20/2023   BUN 24 02/20/2023   No components found for: "CORTRAND", "CORTISOL TOTAL AM", "ALDOSTERONE", "RENIN ACTIVITY", "DEHYDROEPIANDROSTERONE SULFATE", "CATECHOLAMINES FRACTIONATED"    ASSESSMENT / PLAN  1. Secondary adrenal insufficiency (HCC)   2. Dysautonomia orthostatic hypotension syndrome   3. Hypercalcemia   4. Hypervitaminosis D     # Patient has longstanding history of secondary adrenal insufficiency. -Continue methylprednisolone 4 mg in the morning and 2 mg in the late afternoon. -Discussed illness rule out for steroid to take 2-3 times the usual dose in case of illness.  Advised to wear medical alert for adrenal insufficiency bracelet or necklace.  # Patient has longstanding history of orthostatic dysautonomic hypotension syndrome. -Continue Florinef 0.05 mg daily. -Advised to monitor blood pressure  including standing blood pressure at home. -Follow-up lab result of renin.  # Hypercalcemia -Mild hyperglycemia intermittently.  PTH has been normal. -She has osteoporosis on Prolia managed by orthopedic surgeon/primary care provider. -She had high vitamin D level, hypercalcemia can also be related with hypervitaminosis D.  She is taking multivitamin containing vitamin D 1 tablet daily.  Will continue to monitor.  Advised for no additional vitamin D supplement.   Diagnoses and all orders for this  visit:  Secondary adrenal insufficiency (HCC) -     fludrocortisone (FLORINEF) 0.1 MG tablet; Take 1/2 tablet daily. -     Renal function panel  Dysautonomia orthostatic hypotension syndrome  Hypercalcemia  Hypervitaminosis D  Other orders -     methylPREDNISolone (MEDROL) 4 MG tablet; as directed Orally 1 tablet AM and 1/2 tablet afternoon and take additional in case of illness as instructed.     DISPOSITION Follow up in clinic in 4 months suggested.  All questions answered and patient verbalized understanding of the plan.  Katrina Seema Blum, MD [facility] Endocrinology [facility] [address] [place], [address] [place], [place] [address] Phone # [phone]  At least part of this note was generated using voice recognition software. Inadvertent word errors may have occurred, which were not recognized during the proofreading process.

## 2023-02-28 LAB — RENAL FUNCTION PANEL
Albumin: 4.6 g/dL (ref 3.6–5.1)
BUN/Creatinine Ratio: 20 (calc) (ref 6–22)
BUN: 24 mg/dL (ref 7–25)
CO2: 26 mmol/L (ref 20–32)
Calcium: 10.9 mg/dL — ABNORMAL HIGH (ref 8.6–10.4)
Chloride: 104 mmol/L (ref 98–110)
Creat: 1.19 mg/dL — ABNORMAL HIGH (ref 0.60–1.00)
Glucose, Bld: 89 mg/dL (ref 65–99)
Phosphorus: 3.3 mg/dL (ref 2.1–4.3)
Potassium: 4 mmol/L (ref 3.5–5.3)
Sodium: 140 mmol/L (ref 135–146)

## 2023-02-28 LAB — PARATHYROID HORMONE, INTACT (NO CA): PTH: 35 pg/mL (ref 16–77)

## 2023-02-28 LAB — VITAMIN D 25 HYDROXY (VIT D DEFICIENCY, FRACTURES): Vit D, 25-Hydroxy: 78 ng/mL (ref 30–100)

## 2023-02-28 LAB — RENIN: Renin Activity: 3.72 ng/mL/h (ref 0.25–5.82)

## 2023-03-02 ENCOUNTER — Encounter (HOSPITAL_BASED_OUTPATIENT_CLINIC_OR_DEPARTMENT_OTHER): Payer: PPO | Attending: Internal Medicine | Admitting: Internal Medicine

## 2023-03-02 DIAGNOSIS — W19XXXA Unspecified fall, initial encounter: Secondary | ICD-10-CM | POA: Insufficient documentation

## 2023-03-02 DIAGNOSIS — L97812 Non-pressure chronic ulcer of other part of right lower leg with fat layer exposed: Secondary | ICD-10-CM

## 2023-03-02 DIAGNOSIS — T798XXA Other early complications of trauma, initial encounter: Secondary | ICD-10-CM

## 2023-03-02 DIAGNOSIS — G35 Multiple sclerosis: Secondary | ICD-10-CM | POA: Insufficient documentation

## 2023-03-02 DIAGNOSIS — S51001A Unspecified open wound of right elbow, initial encounter: Secondary | ICD-10-CM

## 2023-03-02 DIAGNOSIS — I87311 Chronic venous hypertension (idiopathic) with ulcer of right lower extremity: Secondary | ICD-10-CM

## 2023-03-03 ENCOUNTER — Other Ambulatory Visit (HOSPITAL_COMMUNITY): Payer: Self-pay | Admitting: Family Medicine

## 2023-03-03 ENCOUNTER — Other Ambulatory Visit: Payer: Self-pay

## 2023-03-03 DIAGNOSIS — E43 Unspecified severe protein-calorie malnutrition: Secondary | ICD-10-CM | POA: Diagnosis present

## 2023-03-03 DIAGNOSIS — G2581 Restless legs syndrome: Secondary | ICD-10-CM | POA: Diagnosis present

## 2023-03-03 DIAGNOSIS — F419 Anxiety disorder, unspecified: Secondary | ICD-10-CM | POA: Diagnosis present

## 2023-03-03 DIAGNOSIS — E785 Hyperlipidemia, unspecified: Secondary | ICD-10-CM | POA: Diagnosis present

## 2023-03-03 DIAGNOSIS — G629 Polyneuropathy, unspecified: Secondary | ICD-10-CM | POA: Diagnosis present

## 2023-03-03 DIAGNOSIS — R112 Nausea with vomiting, unspecified: Secondary | ICD-10-CM

## 2023-03-03 DIAGNOSIS — G4700 Insomnia, unspecified: Secondary | ICD-10-CM | POA: Diagnosis present

## 2023-03-03 DIAGNOSIS — G35 Multiple sclerosis: Secondary | ICD-10-CM | POA: Diagnosis present

## 2023-03-03 DIAGNOSIS — Z96652 Presence of left artificial knee joint: Secondary | ICD-10-CM | POA: Diagnosis present

## 2023-03-03 DIAGNOSIS — K529 Noninfective gastroenteritis and colitis, unspecified: Secondary | ICD-10-CM | POA: Diagnosis present

## 2023-03-03 DIAGNOSIS — G63 Polyneuropathy in diseases classified elsewhere: Secondary | ICD-10-CM | POA: Diagnosis present

## 2023-03-03 DIAGNOSIS — E872 Acidosis, unspecified: Secondary | ICD-10-CM | POA: Diagnosis present

## 2023-03-03 DIAGNOSIS — I10 Essential (primary) hypertension: Secondary | ICD-10-CM | POA: Diagnosis present

## 2023-03-03 DIAGNOSIS — R197 Diarrhea, unspecified: Secondary | ICD-10-CM | POA: Diagnosis not present

## 2023-03-03 DIAGNOSIS — Z9049 Acquired absence of other specified parts of digestive tract: Secondary | ICD-10-CM

## 2023-03-03 DIAGNOSIS — M810 Age-related osteoporosis without current pathological fracture: Secondary | ICD-10-CM | POA: Diagnosis present

## 2023-03-03 DIAGNOSIS — K5641 Fecal impaction: Secondary | ICD-10-CM | POA: Diagnosis present

## 2023-03-03 DIAGNOSIS — F1120 Opioid dependence, uncomplicated: Secondary | ICD-10-CM | POA: Diagnosis present

## 2023-03-03 DIAGNOSIS — Z881 Allergy status to other antibiotic agents status: Secondary | ICD-10-CM

## 2023-03-03 DIAGNOSIS — Z7952 Long term (current) use of systemic steroids: Secondary | ICD-10-CM

## 2023-03-03 DIAGNOSIS — N179 Acute kidney failure, unspecified: Secondary | ICD-10-CM | POA: Diagnosis present

## 2023-03-03 DIAGNOSIS — G894 Chronic pain syndrome: Secondary | ICD-10-CM | POA: Diagnosis present

## 2023-03-03 DIAGNOSIS — K567 Ileus, unspecified: Principal | ICD-10-CM | POA: Diagnosis present

## 2023-03-03 DIAGNOSIS — E860 Dehydration: Secondary | ICD-10-CM | POA: Diagnosis present

## 2023-03-03 DIAGNOSIS — M797 Fibromyalgia: Secondary | ICD-10-CM | POA: Diagnosis present

## 2023-03-03 DIAGNOSIS — Z79899 Other long term (current) drug therapy: Secondary | ICD-10-CM

## 2023-03-03 DIAGNOSIS — G901 Familial dysautonomia [Riley-Day]: Secondary | ICD-10-CM | POA: Diagnosis present

## 2023-03-03 DIAGNOSIS — Z886 Allergy status to analgesic agent status: Secondary | ICD-10-CM

## 2023-03-03 DIAGNOSIS — Z9682 Presence of neurostimulator: Secondary | ICD-10-CM

## 2023-03-03 DIAGNOSIS — Z85038 Personal history of other malignant neoplasm of large intestine: Secondary | ICD-10-CM

## 2023-03-03 DIAGNOSIS — I2510 Atherosclerotic heart disease of native coronary artery without angina pectoris: Secondary | ICD-10-CM | POA: Diagnosis present

## 2023-03-03 DIAGNOSIS — R000 Tachycardia, unspecified: Secondary | ICD-10-CM | POA: Diagnosis present

## 2023-03-03 DIAGNOSIS — E271 Primary adrenocortical insufficiency: Secondary | ICD-10-CM | POA: Diagnosis present

## 2023-03-03 DIAGNOSIS — K3184 Gastroparesis: Secondary | ICD-10-CM | POA: Diagnosis present

## 2023-03-03 DIAGNOSIS — Z880 Allergy status to penicillin: Secondary | ICD-10-CM

## 2023-03-03 DIAGNOSIS — Z681 Body mass index (BMI) 19 or less, adult: Secondary | ICD-10-CM

## 2023-03-03 DIAGNOSIS — E162 Hypoglycemia, unspecified: Secondary | ICD-10-CM | POA: Diagnosis present

## 2023-03-03 DIAGNOSIS — E876 Hypokalemia: Secondary | ICD-10-CM | POA: Diagnosis present

## 2023-03-03 DIAGNOSIS — E272 Addisonian crisis: Secondary | ICD-10-CM | POA: Diagnosis present

## 2023-03-03 DIAGNOSIS — Z90710 Acquired absence of both cervix and uterus: Secondary | ICD-10-CM

## 2023-03-03 DIAGNOSIS — Z8249 Family history of ischemic heart disease and other diseases of the circulatory system: Secondary | ICD-10-CM

## 2023-03-03 DIAGNOSIS — F32A Depression, unspecified: Secondary | ICD-10-CM | POA: Diagnosis present

## 2023-03-03 LAB — COMPREHENSIVE METABOLIC PANEL
ALT: 35 U/L (ref 0–44)
AST: 40 U/L (ref 15–41)
Albumin: 4.1 g/dL (ref 3.5–5.0)
Alkaline Phosphatase: 47 U/L (ref 38–126)
Anion gap: 14 (ref 5–15)
BUN: 28 mg/dL — ABNORMAL HIGH (ref 8–23)
CO2: 21 mmol/L — ABNORMAL LOW (ref 22–32)
Calcium: 11.4 mg/dL — ABNORMAL HIGH (ref 8.9–10.3)
Chloride: 101 mmol/L (ref 98–111)
Creatinine, Ser: 1.75 mg/dL — ABNORMAL HIGH (ref 0.44–1.00)
GFR, Estimated: 30 mL/min — ABNORMAL LOW (ref >60)
Glucose, Bld: 150 mg/dL — ABNORMAL HIGH (ref 70–99)
Potassium: 3.7 mmol/L (ref 3.5–5.1)
Sodium: 136 mmol/L (ref 135–145)
Total Bilirubin: 0.8 mg/dL (ref 0.0–1.2)
Total Protein: 7.3 g/dL (ref 6.5–8.1)

## 2023-03-03 LAB — CBC
HCT: 36.7 % (ref 36.0–46.0)
Hemoglobin: 11.7 g/dL — ABNORMAL LOW (ref 12.0–15.0)
MCH: 31.1 pg (ref 26.0–34.0)
MCHC: 31.9 g/dL (ref 30.0–36.0)
MCV: 97.6 fL (ref 80.0–100.0)
Platelets: 228 10*3/uL (ref 150–400)
RBC: 3.76 MIL/uL — ABNORMAL LOW (ref 3.87–5.11)
RDW: 14.3 % (ref 11.5–15.5)
WBC: 7.4 10*3/uL (ref 4.0–10.5)
nRBC: 0 % (ref 0.0–0.2)

## 2023-03-03 LAB — LIPASE, BLOOD: Lipase: 30 U/L (ref 11–51)

## 2023-03-03 MED ORDER — ONDANSETRON 4 MG PO TBDP
4.0000 mg | ORAL_TABLET | Freq: Once | ORAL | Status: AC | PRN
  Administered 2023-03-03: 4 mg via ORAL
  Filled 2023-03-03: qty 1

## 2023-03-03 NOTE — ED Triage Notes (Signed)
Pt reports nausea and diarrhea x 3 days and vomiting and abdominal cramping that began today. Pt states that she is unable to keep p.o. fluids down and thinks she is dehydrated.

## 2023-03-04 ENCOUNTER — Inpatient Hospital Stay
Admission: EM | Admit: 2023-03-04 | Discharge: 2023-03-10 | DRG: 388 | Disposition: A | Discharge status: Home Health | Payer: PPO | Attending: Internal Medicine | Admitting: Internal Medicine

## 2023-03-04 ENCOUNTER — Ambulatory Visit (HOSPITAL_COMMUNITY): Payer: PPO

## 2023-03-04 ENCOUNTER — Encounter: Payer: Self-pay | Admitting: Internal Medicine

## 2023-03-04 ENCOUNTER — Encounter (HOSPITAL_COMMUNITY): Payer: Self-pay

## 2023-03-04 ENCOUNTER — Emergency Department: Payer: PPO

## 2023-03-04 DIAGNOSIS — K56609 Unspecified intestinal obstruction, unspecified as to partial versus complete obstruction: Secondary | ICD-10-CM | POA: Diagnosis not present

## 2023-03-04 DIAGNOSIS — E876 Hypokalemia: Secondary | ICD-10-CM | POA: Diagnosis present

## 2023-03-04 DIAGNOSIS — E872 Acidosis, unspecified: Secondary | ICD-10-CM | POA: Diagnosis present

## 2023-03-04 DIAGNOSIS — G894 Chronic pain syndrome: Secondary | ICD-10-CM | POA: Diagnosis present

## 2023-03-04 DIAGNOSIS — E860 Dehydration: Secondary | ICD-10-CM | POA: Diagnosis present

## 2023-03-04 DIAGNOSIS — Z681 Body mass index (BMI) 19 or less, adult: Secondary | ICD-10-CM | POA: Diagnosis not present

## 2023-03-04 DIAGNOSIS — N179 Acute kidney failure, unspecified: Secondary | ICD-10-CM | POA: Diagnosis present

## 2023-03-04 DIAGNOSIS — R112 Nausea with vomiting, unspecified: Principal | ICD-10-CM

## 2023-03-04 DIAGNOSIS — E785 Hyperlipidemia, unspecified: Secondary | ICD-10-CM | POA: Diagnosis present

## 2023-03-04 DIAGNOSIS — G35 Multiple sclerosis: Secondary | ICD-10-CM | POA: Diagnosis present

## 2023-03-04 DIAGNOSIS — R531 Weakness: Secondary | ICD-10-CM

## 2023-03-04 DIAGNOSIS — E43 Unspecified severe protein-calorie malnutrition: Secondary | ICD-10-CM | POA: Diagnosis present

## 2023-03-04 DIAGNOSIS — K567 Ileus, unspecified: Secondary | ICD-10-CM | POA: Insufficient documentation

## 2023-03-04 DIAGNOSIS — G901 Familial dysautonomia [Riley-Day]: Secondary | ICD-10-CM | POA: Diagnosis present

## 2023-03-04 DIAGNOSIS — R1084 Generalized abdominal pain: Secondary | ICD-10-CM

## 2023-03-04 DIAGNOSIS — F1120 Opioid dependence, uncomplicated: Secondary | ICD-10-CM | POA: Diagnosis present

## 2023-03-04 DIAGNOSIS — I2510 Atherosclerotic heart disease of native coronary artery without angina pectoris: Secondary | ICD-10-CM | POA: Diagnosis present

## 2023-03-04 DIAGNOSIS — R197 Diarrhea, unspecified: Secondary | ICD-10-CM

## 2023-03-04 DIAGNOSIS — K6389 Other specified diseases of intestine: Secondary | ICD-10-CM | POA: Diagnosis not present

## 2023-03-04 DIAGNOSIS — F419 Anxiety disorder, unspecified: Secondary | ICD-10-CM | POA: Diagnosis present

## 2023-03-04 DIAGNOSIS — K5641 Fecal impaction: Secondary | ICD-10-CM | POA: Insufficient documentation

## 2023-03-04 DIAGNOSIS — Z96652 Presence of left artificial knee joint: Secondary | ICD-10-CM | POA: Diagnosis present

## 2023-03-04 DIAGNOSIS — E271 Primary adrenocortical insufficiency: Secondary | ICD-10-CM | POA: Diagnosis present

## 2023-03-04 DIAGNOSIS — E2740 Unspecified adrenocortical insufficiency: Secondary | ICD-10-CM | POA: Diagnosis present

## 2023-03-04 DIAGNOSIS — M797 Fibromyalgia: Secondary | ICD-10-CM | POA: Diagnosis present

## 2023-03-04 DIAGNOSIS — G2581 Restless legs syndrome: Secondary | ICD-10-CM | POA: Diagnosis present

## 2023-03-04 DIAGNOSIS — G35D Multiple sclerosis, unspecified: Secondary | ICD-10-CM | POA: Diagnosis present

## 2023-03-04 DIAGNOSIS — E272 Addisonian crisis: Secondary | ICD-10-CM | POA: Diagnosis present

## 2023-03-04 DIAGNOSIS — Z8249 Family history of ischemic heart disease and other diseases of the circulatory system: Secondary | ICD-10-CM | POA: Diagnosis not present

## 2023-03-04 DIAGNOSIS — I10 Essential (primary) hypertension: Secondary | ICD-10-CM | POA: Diagnosis present

## 2023-03-04 DIAGNOSIS — E878 Other disorders of electrolyte and fluid balance, not elsewhere classified: Secondary | ICD-10-CM | POA: Diagnosis not present

## 2023-03-04 DIAGNOSIS — F32A Depression, unspecified: Secondary | ICD-10-CM | POA: Diagnosis present

## 2023-03-04 HISTORY — DX: Nausea with vomiting, unspecified: R11.2

## 2023-03-04 LAB — GASTROINTESTINAL PANEL BY PCR, STOOL (REPLACES STOOL CULTURE)

## 2023-03-04 LAB — C DIFFICILE QUICK SCREEN W PCR REFLEX
C Diff antigen: NEGATIVE
C Diff interpretation: NOT DETECTED
C Diff toxin: NEGATIVE

## 2023-03-04 LAB — MAGNESIUM: Magnesium: 2 mg/dL (ref 1.7–2.4)

## 2023-03-04 MED ORDER — HYDROCORTISONE SOD SUC (PF) 100 MG IJ SOLR: 50.0000 mg | Freq: Four times a day (QID) | INTRAMUSCULAR | Status: DC

## 2023-03-04 MED ORDER — ONDANSETRON HCL 4 MG/2ML IJ SOLN
4.0000 mg | Freq: Four times a day (QID) | INTRAMUSCULAR | Status: DC | PRN
  Administered 2023-03-04 – 2023-03-09 (×8): 4 mg via INTRAVENOUS
  Filled 2023-03-04 (×8): qty 2

## 2023-03-04 MED ORDER — TOPIRAMATE 25 MG PO TABS
50.0000 mg | ORAL_TABLET | Freq: Every day | ORAL | Status: DC
  Administered 2023-03-04 – 2023-03-09 (×6): 50 mg via ORAL
  Filled 2023-03-04 (×7): qty 2

## 2023-03-04 MED ORDER — SODIUM CHLORIDE 0.9 % IV SOLN: INTRAVENOUS | Status: DC

## 2023-03-04 MED ORDER — HEPARIN SODIUM (PORCINE) 5000 UNIT/ML IJ SOLN
5000.0000 [IU] | Freq: Two times a day (BID) | INTRAMUSCULAR | Status: DC
  Administered 2023-03-04 – 2023-03-10 (×13): 5000 [IU] via SUBCUTANEOUS
  Filled 2023-03-04 (×13): qty 1

## 2023-03-04 MED ORDER — HYDROMORPHONE HCL 1 MG/ML IJ SOLN: 0.5000 mg | INTRAMUSCULAR | Status: DC | PRN

## 2023-03-04 MED ORDER — ONDANSETRON HCL 4 MG/2ML IJ SOLN
4.0000 mg | Freq: Once | INTRAMUSCULAR | Status: AC
  Administered 2023-03-04: 4 mg via INTRAVENOUS
  Filled 2023-03-04: qty 2

## 2023-03-04 MED ORDER — BUPROPION HCL ER (XL) 150 MG PO TB24
300.0000 mg | ORAL_TABLET | Freq: Every morning | ORAL | Status: DC
  Administered 2023-03-04 – 2023-03-10 (×7): 300 mg via ORAL
  Filled 2023-03-04 (×7): qty 2

## 2023-03-04 MED ORDER — ALBUTEROL SULFATE (2.5 MG/3ML) 0.083% IN NEBU: 3.0000 mL | INHALATION_SOLUTION | Freq: Four times a day (QID) | RESPIRATORY_TRACT | Status: DC | PRN

## 2023-03-04 MED ORDER — GABAPENTIN 100 MG PO CAPS: 100.0000 mg | ORAL_CAPSULE | Freq: Two times a day (BID) | ORAL | Status: DC

## 2023-03-04 MED ORDER — FLUTICASONE PROPIONATE 50 MCG/ACT NA SUSP: 1.0000 | Freq: Every day | NASAL | Status: DC | PRN

## 2023-03-04 MED ORDER — SODIUM CHLORIDE 0.9 % IV BOLUS
1000.0000 mL | Freq: Once | INTRAVENOUS | Status: AC
  Administered 2023-03-04: 1000 mL via INTRAVENOUS

## 2023-03-04 MED ORDER — HYDROCORTISONE SOD SUC (PF) 100 MG IJ SOLR
100.0000 mg | Freq: Two times a day (BID) | INTRAMUSCULAR | Status: DC
  Administered 2023-03-04 – 2023-03-09 (×10): 100 mg via INTRAVENOUS
  Filled 2023-03-04 (×13): qty 2

## 2023-03-04 MED ORDER — PANTOPRAZOLE SODIUM 40 MG IV SOLR
40.0000 mg | INTRAVENOUS | Status: DC
  Administered 2023-03-04 – 2023-03-10 (×7): 40 mg via INTRAVENOUS
  Filled 2023-03-04 (×7): qty 10

## 2023-03-04 MED ORDER — ONDANSETRON HCL 4 MG PO TABS: 4.0000 mg | ORAL_TABLET | Freq: Three times a day (TID) | ORAL | 0 refills | Status: AC | PRN

## 2023-03-04 NOTE — ED Notes (Signed)
Pt had a large BM--was cleaned up new brief placed and pt was placed in gown

## 2023-03-04 NOTE — ED Notes (Signed)
ED Provider at bedside.

## 2023-03-04 NOTE — ED Notes (Addendum)
Hospitalist at bedside

## 2023-03-04 NOTE — H&P (Signed)
History and Physical    Katrina Rivera ZOX:[id number] DOB: April 17, 1945 DOA: 03/04/2023  PCP: Katrina Retort, MD (Confirm with patient/family/NH records and if not entered, this has to be entered at [facility] point of entry) Patient coming from: Home  I have personally briefly reviewed patient's old medical records in [facility]  Chief Complaint: Diarrhea, belly hurts  HPI: Katrina Rivera is a 78 y.o. female with medical history significant of Addison's disease on chronic Cortef, colon cancer status post partial colectomy, chronic back pain on narcotics, peripheral neuropathy, HTN, anxiety/depression, presented with abdominal pain distention and diarrhea.  Chronic constipation, last bowel movement was 7 days ago.  Started 3 days ago patient started develop nauseous abdominal pain distention and loose bowel movement 2-3 times a day, denied any fever or chills.  Gradually getting worse and since yesterday patient has not been able to eat or drink because of severe nauseous vomiting.  The vomitus has been mainly food, contains no blood or bile or fecal material. ED Course: Afebrile, borderline tachycardia, nonhypotensive nonhypoxic.  CT abdomen pelvis showed SBO with fecal impaction.  Blood work showed WBC 7.4, creatinine 1.7 compared to baseline 0.9-1.1 bicarb 21, K3.7.  General surgeon consulted, NGT inserted.  Review of Systems: As per HPI otherwise 14 point review of systems negative.    Past Medical History:  Diagnosis Date   Addison's disease (HCC)    takes Solu Cortef daily   Anemia    takes Ferrous Sulfate daily   Anxiety    takes Xanax nightly   Arthritis    Chronic back pain    stenosis   Depression    takes Cymbalta daily   Dizziness    if b/p drops    Fibromyalgia    History of blood transfusion    no abnormal reaction noted   History of bronchitis    many yrs ago    Hypokalemia    takes Potassium daily   Hypotension    takes Florinef daily   IBS (irritable  bowel syndrome)    takes Align daily   Insomnia    takes Trazodone nightly   Joint pain    Multiple sclerosis (HCC)    doesn't take any meds   Multiple sclerosis (HCC)    New onset headache 12/22/2021   Nocturia    Osteoporosis    Palpitations    Peripheral neuropathy    Primary localized osteoarthritis of left knee 08/11/2020   Restless leg syndrome    Seasonal allergies    takes Zyrtec daily;uses Flonase daily as needed   Syncope    when missed methylprednisolone dose   Urinary frequency    takes Flomax daily   Weakness    numbness and tingling    Past Surgical History:  Procedure Laterality Date   ABDOMINAL HYSTERECTOMY  02/07/1986   APPENDECTOMY  02/07/1986   CESAREAN SECTION  1973/1977   x2   CHOLECYSTECTOMY  02/08/1995   COLECTOMY  02/08/1988   COLONOSCOPY     ESOPHAGOGASTRODUODENOSCOPY     EYE SURGERY     bilateral - /w IOL- cataracts   FRACTURE SURGERY Right    rods and screws-right leg   LUMBAR LAMINECTOMY/DECOMPRESSION MICRODISCECTOMY Left 01/24/2013   Procedure: LUMBAR FIVE TO SACRAL ONE LUMBAR LAMINECTOMY/DECOMPRESSION MICRODISCECTOMY 1 LEVEL;  Surgeon: Katrina Alert, MD;  Location: [facility];  Service: Neurosurgery;  Laterality: Left;   MAXIMUM ACCESS (MAS)POSTERIOR LUMBAR INTERBODY FUSION (PLIF) 1 LEVEL N/A 06/18/2014   Procedure: MAXIMUM  ACCESS SURGERY POSTERIOR LUMBAR INTERBODY FUSION LUMBAR FIVE TO SACRAL ONE ;  Surgeon: Katrina Alert, MD;  Location: [facility];  Service: Neurosurgery;  Laterality: N/A;   SPINAL CORD STIMULATOR BATTERY EXCHANGE Right 07/30/2021   Procedure: Spinal cord stimulator battery replacement;  Surgeon: Katrina Alert, MD;  Location: [facility];  Service: Neurosurgery;  Laterality: Right;   SPINAL CORD STIMULATOR IMPLANT     TOTAL KNEE ARTHROPLASTY Left 08/24/2020   Procedure: TOTAL KNEE ARTHROPLASTY;  Surgeon: Katrina Marvel, MD;  Location: [facility];  Service: Orthopedics;  Laterality: Left;     reports that she has never  smoked. She has never used smokeless tobacco. She reports that she does not drink alcohol and does not use drugs.  Allergies  Allergen Reactions   Amoxicillin Other (See Comments)    Upset stomach   Aspirin     bruising   Azithromycin     stomach upset   Doxycycline Hyclate     GI upset   Hydroxyzine     Other Reaction(s): Hallucinations, Other   Hydroxyzine Hcl Other (See Comments)   Buspirone Palpitations   Buspirone Hcl Palpitations   Mirtazapine Palpitations    Weight gain    Family History  Problem Relation Age of Onset   Hypertension Mother    Stroke Mother    Heart attack Father    Tremor Brother      Prior to Admission medications   Medication Sig Start Date End Date Taking? Authorizing Provider  ondansetron (ZOFRAN) 4 MG tablet Take 1 tablet (4 mg total) by mouth every 8 (eight) hours as needed for nausea or vomiting. 03/04/23  Yes Katrina Jarvis, MD  acetaminophen (TYLENOL) 325 MG tablet Take 1-2 tablets (325-650 mg total) by mouth every 4 (four) hours as needed for mild pain. 08/29/22   [facility]  albuterol (VENTOLIN HFA) 108 (90 Base) MCG/ACT inhaler Inhale 2 puffs into the lungs every 6 (six) hours as needed for wheezing. 11/07/22   [provider]  alfuzosin (UROXATRAL) 10 MG 24 hr tablet Take 10 mg by mouth daily with breakfast.    [provider]  amLODipine (NORVASC) 2.5 MG tablet Take 1 tablet (2.5 mg total) by mouth daily. 08/29/22   [facility]  bethanechol (URECHOLINE) 10 MG tablet Take 10 mg by mouth 2 (two) times daily. 12/12/22   [provider]  buPROPion (WELLBUTRIN XL) 300 MG 24 hr tablet Take 300 mg by mouth every morning. 12/13/22   [provider]  cephALEXin (KEFLEX) 250 MG capsule Take 250 mg by mouth every other day. 06/04/22   [provider]  cholecalciferol (VITAMIN D3) 25 MCG (1000 UNIT) tablet Take 1,000 Units by mouth every morning.    [provider]  clonazePAM  (KLONOPIN) 0.5 MG tablet Take 1 tablet (0.5 mg total) by mouth at bedtime as needed for anxiety. Patient taking differently: Take 0.5 mg by mouth at bedtime. 08/04/22   [facility]  CRANBERRY PO Take 1 tablet by mouth 2 (two) times daily.    [provider]  dicyclomine (BENTYL) 20 MG tablet Take 1 tablet by mouth 4 (four) times daily as needed for spasms.    [provider]  DULoxetine (CYMBALTA) 60 MG capsule Take 1 capsule (60 mg total) by mouth daily. Patient not taking: Reported on 02/24/2023 05/17/22   Levert Feinstein, MD  ferrous sulfate 325 (65 FE) MG tablet Take 325 mg by mouth daily with breakfast.  [provider]  fludrocortisone (FLORINEF) 0.1 MG tablet Take 1/2 tablet daily. 02/24/23   Thapa, [place], MD  fluticasone (CUTIVATE) 0.05 % cream Apply 1 application  topically 2 (two) times daily as needed for irritation (Rosacea). 06/09/21   [provider]  fluticasone (FLONASE) 50 MCG/ACT nasal spray Place 1 spray into both nostrils daily as needed for allergies or rhinitis.    [provider]  gabapentin (NEURONTIN) 300 MG capsule Take 3 capsules (900 mg total) by mouth at bedtime. Patient not taking: Reported on 02/24/2023 08/29/22   Milinda Antis, [facility]  gabapentin (NEURONTIN) 300 MG capsule Take 1 capsule (300 mg total) by mouth daily at 6 (six) AM. Patient not taking: Reported on 02/24/2023 08/29/22   Milinda Antis, [facility]  Gabapentin Enacarbil (HORIZANT PO) Take by mouth.    [provider]  HYDROcodone-acetaminophen (NORCO) 7.5-325 MG tablet Take 1 tablet by mouth every 4 (four) hours as needed for moderate pain ((score 4 to 6)). 08/29/22   [facility]  HYDROcodone-acetaminophen (NORCO/VICODIN) 5-325 MG tablet Take 1 tablet by mouth every 8 (eight) hours as needed for moderate pain (pain score 4-6).    [provider]  methylPREDNISolone (MEDROL) 4 MG tablet as directed Orally 1 tablet AM and 1/2 tablet afternoon  and take additional in case of illness as instructed. 02/24/23   Thapa, [place], MD  Multiple Vitamin (MULTIVITAMIN) tablet Take 1 tablet by mouth in the morning.    [provider]  ondansetron (ZOFRAN) 4 MG tablet Take 1 tablet (4 mg total) by mouth every 8 (eight) hours as needed for nausea or vomiting. 02/15/23   Lovorn, Aundra Millet, MD  oxyCODONE-acetaminophen (PERCOCET) 10-325 MG tablet Take 1 tablet by mouth every 4 (four) hours as needed for pain.    [provider]  oxyCODONE-acetaminophen (PERCOCET/ROXICET) 5-325 MG tablet Take 1 tablet by mouth every 6 (six) hours as needed for severe pain (pain score 7-10).    [provider]  pantoprazole (PROTONIX) 20 MG tablet Take 20 mg by mouth every morning. 12/12/22   [provider]  Polyethyl Glycol-Propyl Glycol (SYSTANE OP) Place 2 drops into both eyes daily as needed (for dry eyes).    [provider]  polyethylene glycol (MIRALAX) 17 g packet Take 17 g by mouth 2 (two) times daily. 17 grams in 6 oz of favorite drink twice a day until bowel movement.  LAXITIVE.  Restart if two days since last bowel movement Patient taking differently: Take 17 g by mouth daily as needed for moderate constipation. 08/24/20   Shepperson, Kirstin, [facility]  Probiotic Product (PROBIOTIC PO) Take 1 capsule by mouth every morning.    [provider]  promethazine (PHENERGAN) 25 MG tablet Take 25 mg by mouth every 6 (six) hours as needed for nausea or vomiting.    [provider]  tiZANidine (ZANAFLEX) 2 MG tablet Take 2 tablets (4 mg total) by mouth every 8 (eight) hours as needed for muscle spasms. Patient taking differently: Take 4 mg by mouth at bedtime. 06/23/22   Arman Bogus, MD  topiramate (TOPAMAX) 50 MG tablet Take 1 tablet (50 mg total) by mouth at bedtime. X 1 week, then 75 mg nightly x 1 week, ,then 100 mg at bedtime- for prevention of headaches 02/15/23   [name], MD    Physical Exam: Vitals:    03/03/23 2047 03/04/23 0123 03/04/23 0545 03/04/23 0806  BP:  100/74 138/79 (!) 149/69  Pulse:  99 84  83  Resp:  18 16 16   Temp:  98.7 F (37.1 C) 98.8 F (37.1 C) 99 F (37.2 C)  TempSrc:  Oral Oral Oral  SpO2:  97% 98% 96%  Weight: 44.9 kg     Height: 5' (1.524 m)       Constitutional: NAD, calm, comfortable Vitals:   03/03/23 2047 03/04/23 0123 03/04/23 0545 03/04/23 0806  BP:  100/74 138/79 (!) 149/69  Pulse:  99 84 83  Resp:  18 16 16   Temp:  98.7 F (37.1 C) 98.8 F (37.1 C) 99 F (37.2 C)  TempSrc:  Oral Oral Oral  SpO2:  97% 98% 96%  Weight: 44.9 kg     Height: 5' (1.524 m)      Eyes: PERRL, lids and conjunctivae normal ENMT: Mucous membranes are moist. Posterior pharynx clear of any exudate or lesions.Normal dentition.  Neck: normal, supple, no masses, no thyromegaly Respiratory: clear to auscultation bilaterally, no wheezing, no crackles. Normal respiratory effort. No accessory muscle use.  Cardiovascular: Regular rate and rhythm, no murmurs / rubs / gallops. No extremity edema. 2+ pedal pulses. No carotid bruits.  Abdomen: Distended, increasing bowel sounds, mild tenderness on periumbilical area, no rebound no guarding, no masses palpated. No hepatosplenomegaly. Bowel sounds positive.  Musculoskeletal: no clubbing / cyanosis. No joint deformity upper and lower extremities. Good ROM, no contractures. Normal muscle tone.  Skin: no rashes, lesions, ulcers. No induration Neurologic: CN 2-12 grossly intact. Sensation intact, DTR normal. Strength 5/5 in all 4.  Psychiatric: Normal judgment and insight. [facility] and oriented x 3. Normal mood.    Labs on Admission: I have personally reviewed following labs and imaging studies  CBC: Recent Labs  Lab 03/03/23 2052  WBC 7.4  HGB 11.7*  HCT 36.7  MCV 97.6  PLT 228   Basic Metabolic Panel: Recent Labs  Lab 03/03/23 2052  NA 136  K 3.7  CL 101  CO2 21*  GLUCOSE 150*  BUN 28*  CREATININE 1.75*  CALCIUM 11.4*   MG 2.0   GFR: Estimated Creatinine Clearance: 19.7 mL/min (A) (by C-G formula based on SCr of 1.75 mg/dL (H)). Liver Function Tests: Recent Labs  Lab 03/03/23 2052  AST 40  ALT 35  ALKPHOS 47  BILITOT 0.8  PROT 7.3  ALBUMIN 4.1   Recent Labs  Lab 03/03/23 2052  LIPASE 30   No results for input(s): "AMMONIA" in the last 168 hours. Coagulation Profile: No results for input(s): "INR", "PROTIME" in the last 168 hours. Cardiac Enzymes: No results for input(s): "CKTOTAL", "CKMB", "CKMBINDEX", "TROPONINI" in the last 168 hours. BNP (last 3 results) No results for input(s): "PROBNP" in the last 8760 hours. HbA1C: No results for input(s): "HGBA1C" in the last 72 hours. CBG: No results for input(s): "GLUCAP" in the last 168 hours. Lipid Profile: No results for input(s): "CHOL", "HDL", "LDLCALC", "TRIG", "CHOLHDL", "LDLDIRECT" in the last 72 hours. Thyroid Function Tests: No results for input(s): "TSH", "T4TOTAL", "FREET4", "T3FREE", "THYROIDAB" in the last 72 hours. Anemia Panel: No results for input(s): "VITAMINB12", "FOLATE", "FERRITIN", "TIBC", "IRON", "RETICCTPCT" in the last 72 hours. Urine analysis:    Component Value Date/Time   COLORURINE YELLOW 12/28/2022 0925   APPEARANCEUR CLEAR 12/28/2022 0925   LABSPEC 1.013 12/28/2022 0925   PHURINE 7.0 12/28/2022 0925   GLUCOSEU NEGATIVE 12/28/2022 0925   GLUCOSEU NEGATIVE 01/21/2016 1628   HGBUR NEGATIVE 12/28/2022 0925   BILIRUBINUR NEGATIVE 12/28/2022 0925   BILIRUBINUR negative 01/21/2016 1212   KETONESUR NEGATIVE  12/28/2022 0925   PROTEINUR NEGATIVE 12/28/2022 0925   UROBILINOGEN 0.2 01/21/2016 1628   UROBILINOGEN 0.2 01/21/2016 1212   NITRITE POSITIVE (A) 12/28/2022 0925   LEUKOCYTESUR TRACE (A) 12/28/2022 0925    Radiological Exams on Admission: CT ABDOMEN PELVIS WO CONTRAST Result Date: 03/04/2023 CLINICAL DATA:  Bowel obstruction. Nausea and diarrhea for 3 days. Vomiting. Abdominal cramping. EXAM: CT ABDOMEN  AND PELVIS WITHOUT CONTRAST TECHNIQUE: Multidetector CT imaging of the abdomen and pelvis was performed following the standard protocol without IV contrast. RADIATION DOSE REDUCTION: This exam was performed according to the departmental dose-optimization program which includes automated exposure control, adjustment of the mA and/or kV according to patient size and/or use of iterative reconstruction technique. COMPARISON:  12/28/2022 FINDINGS: Lower chest: No acute findings. Hepatobiliary: No suspicious focal abnormality in the liver on this study without intravenous contrast. Gallbladder is surgically absent. Stable common bile duct measures 9 mm similar to minimally decreased from 10 mm previously. Pancreas: Mild prominence of the main pancreatic duct in the head of the pancreas is stable in the interval. Spleen: No splenomegaly. No suspicious focal mass lesion. Adrenals/Urinary Tract: No adrenal nodule or mass. Stable small nonobstructing stone lower pole right kidney. Left kidney unremarkable. No evidence for hydroureter. Posterior left bladder wall diverticulum again noted. Bladder otherwise unremarkable. Stomach/Bowel: Stomach is distended and filled with fluid. Duodenum is fluid-filled and distended. Proximal small bowel in the left abdomen is fluid-filled and distended up to about 3.5 cm diameter. Small bowel in the right abdomen is less distended. Patient is status post subtotal colectomy in the ileocolic anastomosis and rectum are markedly dilated and filled with stool and fluid. Rectum measures 8.3 cm diameter. Vascular/Lymphatic: There is advanced atherosclerotic calcification of the abdominal aorta without aneurysm. There is no gastrohepatic or hepatoduodenal ligament lymphadenopathy. No retroperitoneal or mesenteric lymphadenopathy. No pelvic sidewall lymphadenopathy. Reproductive: There is no adnexal mass. Other: No intraperitoneal free fluid. Musculoskeletal: No worrisome lytic or sclerotic osseous  abnormality. IMPRESSION: 1. Status post subtotal colectomy. The enterocolic anastomosis and rectum are markedly dilated and filled with stool and fluid. Rectum measures 8.3 cm diameter. Stomach and proximal small bowel are fluid-filled with proximal small bowel distended up to about 3.5 cm diameter. Other small bowel loops are less distended. No overt small bowel transition zone can be identified. Given the large volume of fluid in stool in the rectum, features may reflect underlying component of fecal impaction. Severe ileus or rectal stricture would also be considerations. 2. Stable small nonobstructing stone lower pole right kidney. 3. Similar biliary dilatation status post cholecystectomy. Correlation with liver function test recommended. 4. Stable mild prominence of the main pancreatic duct in the head of the pancreas. 5.  Aortic Atherosclerosis (ICD10-I70.0). Electronically Signed   By: Kennith Center M.D.   On: 03/04/2023 06:19    EKG: Independently reviewed.  Sinus tachycardia, no acute ST changes.  Assessment/Plan Principal Problem:   SBO (small bowel obstruction) (HCC)  (please populate well all problems here in Problem List. (For example, if patient is on BP meds at home and you resume or decide to hold them, it is a problem that needs to be her. Same for CAD, COPD, HLD and so on)  SBO With overflow diarrhea -Secondary to fecal impaction and severe constipation -NGT -General Surgeon consulted -Other supportive care IV fluid, pain medication and as needed Zofran -GI prophylaxis with PPI  AKI -Likely prerenal secondary to decreased p.o. intake secondary to SBO -IV fluid, daily BMPs  Addison's disease -  Switch to stress dose of IV hydrocortisone  HTN -As needed hydralazine for  Chronic pain syndrome narcotic dependence -Resume p.o. narcotics once able to take p.o. medications -Gabapentin and Topamax  Anxiety/depression -Resume SSRI once able to take p.o.  DVT prophylaxis:  Lovenox Code Status: Full code Family Communication: Husband at bedside Disposition Plan: Patient is sick with SBO requiring NGT and inpatient surgery consultation, expect more than 2 midnight [facility] stay Consults called: General surgeon Admission status: Tele admit   Emeline General MD [facility] Pager [phone]  03/04/2023, 9:50 AM

## 2023-03-04 NOTE — ED Notes (Signed)
Oral care provided for pt.

## 2023-03-04 NOTE — Progress Notes (Signed)
PHARMACIST - PHYSICIAN COMMUNICATION   CONCERNING: Methylprednisolone IV    Current order: Methylprednisolone IV 50mg  IV every 6 hours     DESCRIPTION: Per [facility] Protocol:   IV methylprednisolone will be converted to either a q12h or q24h frequency with the same total daily dose (TDD).  Ordered Dose: 1 to 125 mg TDD; convert to: TDD q24h.  Ordered Dose: 126 to 250 mg TDD; convert to: TDD div q12h.  Ordered Dose: >250 mg TDD; DAW.  Order has been adjusted to: Methylprednisolone IV 100mg  every 12 hours   Gardner Candle , PharmD, BCPS Clinical Pharmacist  03/04/2023 9:00 AM

## 2023-03-04 NOTE — ED Provider Notes (Signed)
[facility] Provider Note    Event Date/Time   First MD Initiated Contact with Patient 03/04/23 [phone]     (approximate)   History   Emesis   HPI  [facility] is a 78 y.o. female   Past medical history of IBS, fibromyalgia, restless leg syndrome, multiple sclerosis, Addison's disease, dysautonomia and orthostatic hypotension who presents to the emergency department with nausea vomiting and diarrhea.  This has been ongoing approaching 1 week now.  Nauseous and vomiting, as well as low volume liquid stools.  Has been passing flatus.  Feels a bloating abdominal pain and abdominal distention.  History of multiple pelvic/abdominal surgeries.  No GI bleeding.  No urinary symptoms.  No fevers or chills.  No recent international travel, hospitalizations, or antibiotic use.  Independent Historian contributed to assessment above: Her husband is at bedside to corroborate information and past medical history as above  External Medical Documents Reviewed: Endocrinology visit on February 14, 2023 documenting past medical history of secondary adrenal insufficiency and longstanding dysautonomia with orthostatic hypotension      Physical Exam   Triage Vital Signs: ED Triage Vitals  Encounter Vitals Group     BP 03/03/23 2046 129/65     Systolic BP Percentile --      Diastolic BP Percentile --      Pulse Rate 03/03/23 2046 (!) 102     Resp 03/03/23 2046 15     Temp 03/03/23 2046 98.5 F (36.9 C)     Temp Source 03/03/23 2046 Oral     SpO2 03/03/23 2046 95 %     Weight 03/03/23 2047 99 lb (44.9 kg)     Height 03/03/23 2047 5' (1.524 m)     Head Circumference --      Peak Flow --      Pain Score 03/03/23 2046 10     Pain Loc --      Pain Education --      Exclude from Growth Chart --     Most recent vital signs: Vitals:   03/04/23 0123 03/04/23 0545  BP: 100/74 138/79  Pulse: 99 84  Resp: 18 16  Temp: 98.7 F (37.1 C) 98.8 F (37.1 C)  SpO2: 97%  98%    General: Awake, no distress.  CV:  Mild tachycardia Resp:  Normal effort.  Other:  She has cracked lips and dry mucous membranes, poor skin turgor and looks dehydrated.  She has a mildly distended abdomen which has mild diffuse tenderness to palpation without any rigidity or guarding.   ED Results / Procedures / Treatments   Labs (all labs ordered are listed, but only abnormal results are displayed) Labs Reviewed  COMPREHENSIVE METABOLIC PANEL - Abnormal; Notable for the following components:      Result Value   CO2 21 (*)    Glucose, Bld 150 (*)    BUN 28 (*)    Creatinine, Ser 1.75 (*)    Calcium 11.4 (*)    GFR, Estimated 30 (*)    All other components within normal limits  CBC - Abnormal; Notable for the following components:   RBC 3.76 (*)    Hemoglobin 11.7 (*)    All other components within normal limits  GASTROINTESTINAL PANEL BY PCR, STOOL (REPLACES STOOL CULTURE)  C DIFFICILE QUICK SCREEN W PCR REFLEX    LIPASE, BLOOD  MAGNESIUM  URINALYSIS, ROUTINE W REFLEX MICROSCOPIC     I ordered and reviewed the above labs they  are notable for increased creatinine compared to prior today at 1.75   RADIOLOGY I independently reviewed and interpreted CT scan of the abdomen pelvis and see some dilated loops of bowel concerning for either ileus or obstruction I also reviewed radiologist's formal read.   PROCEDURES:  Critical Care performed: No  Procedures   MEDICATIONS ORDERED IN ED: Medications  ondansetron (ZOFRAN-ODT) disintegrating tablet 4 mg (4 mg Oral Given 03/03/23 2050)  sodium chloride 0.9 % bolus 1,000 mL (1,000 mLs Intravenous New Bag/Given 03/04/23 0547)  ondansetron (ZOFRAN) injection 4 mg (4 mg Intravenous Given 03/04/23 0548)    External physician / consultants:  I spoke with Dr. Randel Books of general surgery regarding care plan for this patient.   IMPRESSION / MDM / ASSESSMENT AND PLAN / ED COURSE  I reviewed the triage vital signs and the  nursing notes.                                Patient's presentation is most consistent with acute presentation with potential threat to life or bodily function.  Differential diagnosis includes, but is not limited to, viral gastroenteritis, colitis, obstruction, intra-abdominal infection, electrolyte derangement, AKI, dehydration   The patient is on the cardiac monitor to evaluate for evidence of arrhythmia and/or significant heart rate changes.  MDM:    Nausea vomiting diarrhea in this patient with multiple abdominal surgeries and distention tenderness to palpation concerning for obstruction or intra-abdominal infection.  She does look dehydrated and indeed has an AKI for which I have ordered IV crystalloid bolus and IV antiemetic.  I ordered a CT scan of the abdomen pelvis to check for obstruction or infection, without IV contrast given her AKI.   -- CT scan shows fluid-filled rectum, enterocolonic anastomosis site, and proximal small bowel that is concerning for either fecal impaction, severe ileus or rectal stricture.  Did a rectal exam and there is liquid stool throughout but no hard masses amenable for manual disimpaction at this time.  She is not active vomiting during her stay in the ED.  I consulted with Dr. Baker Pierini who will take a look at the pictures but recommend for the time being place NG tube, and hospital admission.  Further recommendations pending his evaluation and review of imaging.      FINAL CLINICAL IMPRESSION(S) / ED DIAGNOSES   Final diagnoses:  Nausea vomiting and diarrhea  Generalized abdominal pain  Intestinal obstruction, unspecified cause, unspecified whether partial or complete (HCC)     Rx / DC Orders   ED Discharge Orders          Ordered    ondansetron (ZOFRAN) 4 MG tablet  Every 8 hours PRN        03/04/23 8657             Note:  This document was prepared using Dragon voice recognition software and may include unintentional  dictation errors.    Pilar Jarvis, MD 03/04/23 [phone]

## 2023-03-04 NOTE — ED Notes (Signed)
Pt did not tolerate NG tube placement, pt reports" I can't do that" MD made aware

## 2023-03-04 NOTE — Consult Note (Signed)
Patient ID: ARTELIA GAME, female   DOB: 11/03/1945, 78 y.o.   MRN: [id number] CC: Nausea and Vomiting History of Present Illness CASSIA FEIN is a 78 y.o. female with past medical history significant for Addison's disease who is on Cortef, MS, chronic back pain on narcotics who presents with abdominal pain as well as nausea and vomiting and diarrhea.  The patient reports that she has chronic constipation.  She says that she has been having a hard time having a bowel movement over the last 7 days.  However about 3 days ago she started to have loose bowel movements.  She said that she had a very loose bowel movement yesterday morning and that was her last 1.  She also reports that since this time she has started to have increased nausea and multiple episodes of Vomiting.  Due to the ongoing nausea and vomiting she presented to the ED for evaluation. In her records and says that she has colon cancer in the past however she reports to me that she had a colon resection due to her MS.  She is unsure of the and that why she had a resection.  She had this many years ago and it was done open Past Medical History Past Medical History:  Diagnosis Date   Addison's disease (HCC)    takes Solu Cortef daily   Anemia    takes Ferrous Sulfate daily   Anxiety    takes Xanax nightly   Arthritis    Chronic back pain    stenosis   Depression    takes Cymbalta daily   Dizziness    if b/p drops    Fibromyalgia    History of blood transfusion    no abnormal reaction noted   History of bronchitis    many yrs ago    Hypokalemia    takes Potassium daily   Hypotension    takes Florinef daily   IBS (irritable bowel syndrome)    takes Align daily   Insomnia    takes Trazodone nightly   Joint pain    Multiple sclerosis (HCC)    doesn't take any meds   Multiple sclerosis (HCC)    New onset headache 12/22/2021   Nocturia    Osteoporosis    Palpitations    Peripheral neuropathy    Primary localized  osteoarthritis of left knee 08/11/2020   Restless leg syndrome    Seasonal allergies    takes Zyrtec daily;uses Flonase daily as needed   Syncope    when missed methylprednisolone dose   Urinary frequency    takes Flomax daily   Weakness    numbness and tingling       Past Surgical History:  Procedure Laterality Date   ABDOMINAL HYSTERECTOMY  02/07/1986   APPENDECTOMY  02/07/1986   CESAREAN SECTION  1973/1977   x2   CHOLECYSTECTOMY  02/08/1995   COLECTOMY  02/08/1988   COLONOSCOPY     ESOPHAGOGASTRODUODENOSCOPY     EYE SURGERY     bilateral - /w IOL- cataracts   FRACTURE SURGERY Right    rods and screws-right leg   LUMBAR LAMINECTOMY/DECOMPRESSION MICRODISCECTOMY Left 01/24/2013   Procedure: LUMBAR FIVE TO SACRAL ONE LUMBAR LAMINECTOMY/DECOMPRESSION MICRODISCECTOMY 1 LEVEL;  Surgeon: Tia Alert, MD;  Location: [facility] NEURO ORS;  Service: Neurosurgery;  Laterality: Left;   MAXIMUM ACCESS (MAS)POSTERIOR LUMBAR INTERBODY FUSION (PLIF) 1 LEVEL N/A 06/18/2014   Procedure: MAXIMUM ACCESS SURGERY POSTERIOR LUMBAR INTERBODY FUSION LUMBAR FIVE TO SACRAL ONE ;  Surgeon: Tia Alert, MD;  Location: [facility]URO ORS;  Service: Neurosurgery;  Laterality: N/A;   SPINAL CORD STIMULATOR BATTERY EXCHANGE Right 07/30/2021   Procedure: Spinal cord stimulator battery replacement;  Surgeon: Tia Alert, MD;  Location: [facility] OR;  Service: Neurosurgery;  Laterality: Right;   SPINAL CORD STIMULATOR IMPLANT     TOTAL KNEE ARTHROPLASTY Left 08/24/2020   Procedure: TOTAL KNEE ARTHROPLASTY;  Surgeon: Salvatore Marvel, MD;  Location: [facility] ORS;  Service: Orthopedics;  Laterality: Left;    Allergies  Allergen Reactions   Amoxicillin Other (See Comments)    Upset stomach   Aspirin     bruising   Azithromycin     stomach upset   Doxycycline Hyclate     GI upset   Hydroxyzine     Other Reaction(s): Hallucinations, Other   Hydroxyzine Hcl Other (See Comments)   Buspirone Palpitations   Buspirone Hcl  Palpitations   Mirtazapine Palpitations    Weight gain    Current Facility-Administered Medications  Medication Dose Route Frequency Provider Last Rate Last Admin   0.9 %  sodium chloride infusion   Intravenous Continuous Mikey College T, MD 100 mL/hr at 03/04/23 1405 New Bag at 03/04/23 1405   albuterol (PROVENTIL) (2.5 MG/3ML) 0.083% nebulizer solution 3 mL  3 mL Inhalation Q6H PRN Mikey College T, MD       buPROPion (WELLBUTRIN XL) 24 hr tablet 300 mg  300 mg Oral q morning Mikey College T, MD   300 mg at 03/04/23 0958   fluticasone (FLONASE) 50 MCG/ACT nasal spray 1 spray  1 spray Each Nare Daily PRN Emeline General, MD       [START ON 03/05/2023] gabapentin (NEURONTIN) capsule 100 mg  100 mg Oral BID Mikey College T, MD       heparin injection 5,000 Units  5,000 Units Subcutaneous Q12H Mikey College T, MD   5,000 Units at 03/04/23 0959   hydrocortisone sodium succinate (SOLU-CORTEF) 100 MG injection 100 mg  100 mg Intravenous Q12H Hallaji, Sheema M, RPH       HYDROmorphone (DILAUDID) injection 0.5-1 mg  0.5-1 mg Intravenous Q2H PRN Mikey College T, MD       ondansetron [facility]) injection 4 mg  4 mg Intravenous Q6H PRN Mikey College T, MD   4 mg at 03/04/23 0948   pantoprazole (PROTONIX) injection 40 mg  40 mg Intravenous Q24H Mikey College T, MD   40 mg at 03/04/23 0955   topiramate (TOPAMAX) tablet 50 mg  50 mg Oral QHS Emeline General, MD       Current Outpatient Medications  Medication Sig Dispense Refill   ondansetron (ZOFRAN) 4 MG tablet Take 1 tablet (4 mg total) by mouth every 8 (eight) hours as needed for nausea or vomiting. 20 tablet 0   acetaminophen (TYLENOL) 325 MG tablet Take 1-2 tablets (325-650 mg total) by mouth every 4 (four) hours as needed for mild pain.     albuterol (VENTOLIN HFA) 108 (90 Base) MCG/ACT inhaler Inhale 2 puffs into the lungs every 6 (six) hours as needed for wheezing.     alfuzosin (UROXATRAL) 10 MG 24 hr tablet Take 10 mg by mouth daily with breakfast.     amLODipine  (NORVASC) 2.5 MG tablet Take 1 tablet (2.5 mg total) by mouth daily. 30 tablet 0   bethanechol (URECHOLINE) 10 MG tablet Take 10 mg by mouth 2 (two) times daily.     buPROPion (WELLBUTRIN XL) 300 MG 24 hr tablet  Take 300 mg by mouth every morning.     cephALEXin (KEFLEX) 250 MG capsule Take 250 mg by mouth every other day.     cholecalciferol (VITAMIN D3) 25 MCG (1000 UNIT) tablet Take 1,000 Units by mouth every morning.     clonazePAM (KLONOPIN) 0.5 MG tablet Take 1 tablet (0.5 mg total) by mouth at bedtime as needed for anxiety. (Patient taking differently: Take 0.5 mg by mouth at bedtime.) 30 tablet 1   CRANBERRY PO Take 1 tablet by mouth 2 (two) times daily.     dicyclomine (BENTYL) 20 MG tablet Take 1 tablet by mouth 4 (four) times daily as needed for spasms.     DULoxetine (CYMBALTA) 60 MG capsule Take 1 capsule (60 mg total) by mouth daily. (Patient not taking: Reported on 02/24/2023) 90 capsule 3   ferrous sulfate 325 (65 FE) MG tablet Take 325 mg by mouth daily with breakfast.     fludrocortisone (FLORINEF) 0.1 MG tablet Take 1/2 tablet daily. 45 tablet 3   fluticasone (CUTIVATE) 0.05 % cream Apply 1 application  topically 2 (two) times daily as needed for irritation (Rosacea).     fluticasone (FLONASE) 50 MCG/ACT nasal spray Place 1 spray into both nostrils daily as needed for allergies or rhinitis.     gabapentin (NEURONTIN) 300 MG capsule Take 3 capsules (900 mg total) by mouth at bedtime. (Patient not taking: Reported on 02/24/2023)     gabapentin (NEURONTIN) 300 MG capsule Take 1 capsule (300 mg total) by mouth daily at 6 (six) AM. (Patient not taking: Reported on 02/24/2023)     Gabapentin Enacarbil (HORIZANT PO) Take by mouth.     HYDROcodone-acetaminophen (NORCO) 7.5-325 MG tablet Take 1 tablet by mouth every 4 (four) hours as needed for moderate pain ((score 4 to 6)). 30 tablet 0   HYDROcodone-acetaminophen (NORCO/VICODIN) 5-325 MG tablet Take 1 tablet by mouth every 8 (eight) hours  as needed for moderate pain (pain score 4-6).     methylPREDNISolone (MEDROL) 4 MG tablet as directed Orally 1 tablet AM and 1/2 tablet afternoon and take additional in case of illness as instructed. 160 tablet 3   Multiple Vitamin (MULTIVITAMIN) tablet Take 1 tablet by mouth in the morning.     ondansetron (ZOFRAN) 4 MG tablet Take 1 tablet (4 mg total) by mouth every 8 (eight) hours as needed for nausea or vomiting. 90 tablet 3   oxyCODONE-acetaminophen (PERCOCET) 10-325 MG tablet Take 1 tablet by mouth every 4 (four) hours as needed for pain.     oxyCODONE-acetaminophen (PERCOCET/ROXICET) 5-325 MG tablet Take 1 tablet by mouth every 6 (six) hours as needed for severe pain (pain score 7-10).     pantoprazole (PROTONIX) 20 MG tablet Take 20 mg by mouth every morning.     Polyethyl Glycol-Propyl Glycol (SYSTANE OP) Place 2 drops into both eyes daily as needed (for dry eyes).     polyethylene glycol (MIRALAX) 17 g packet Take 17 g by mouth 2 (two) times daily. 17 grams in 6 oz of favorite drink twice a day until bowel movement.  LAXITIVE.  Restart if two days since last bowel movement (Patient taking differently: Take 17 g by mouth daily as needed for moderate constipation.) 14 packet 0   Probiotic Product (PROBIOTIC PO) Take 1 capsule by mouth every morning.     promethazine (PHENERGAN) 25 MG tablet Take 25 mg by mouth every 6 (six) hours as needed for nausea or vomiting.     tiZANidine (ZANAFLEX) 2  MG tablet Take 2 tablets (4 mg total) by mouth every 8 (eight) hours as needed for muscle spasms. (Patient taking differently: Take 4 mg by mouth at bedtime.) 60 tablet 1   topiramate (TOPAMAX) 50 MG tablet Take 1 tablet (50 mg total) by mouth at bedtime. X 1 week, then 75 mg nightly x 1 week, ,then 100 mg at bedtime- for prevention of headaches 60 tablet 5    Family History Family History  Problem Relation Age of Onset   Hypertension Mother    Stroke Mother    Heart attack Father    Tremor Brother         Social History Social History   Tobacco Use   Smoking status: Never   Smokeless tobacco: Never  Vaping Use   Vaping status: Never Used  Substance Use Topics   Alcohol use: Never   Drug use: Never        ROS Full ROS of systems performed and is otherwise negative there than what is stated in the HPI  Physical Exam Blood pressure (!) 138/56, pulse 70, temperature 99 F (37.2 C), temperature source Oral, resp. rate 16, height 5' (1.524 m), weight 44.9 kg, SpO2 95%.  Frail-appearing woman laying in bed, she is in no acute distress, normal work of breathing on room air, regular rate and rhythm, abdomen is soft, slightly distended and somewhat tender mostly in the upper abdomen.  She has a midline scar that is well-healed.  There is some tympany.  I attempted to do a digital rectal exam but she had a large amount of dark stool so I deferred a rectal exam. Data Reviewed Labs reviewed and notable for an increase in her creatinine up to 1.75 from what looks like a baseline of 1.19.  She has a normal white count.  Her CT scan does show that she has dilated loops of bowel proximally but without a transition point.  She also has a large stool burden at her ileocolonic anastomosis but it does not look stricture to me.  I have personally reviewed the patient's imaging and medical records.    Assessment/Plan    Patient is a 78 year old with 3-day history of nausea and vomiting.  She also has been having diarrhea.  She has a complex past medical history including a subtotal colectomy for possibly MS but no one really knows the reason my guess is it is because she had some kind of functional problems secondary to her MS.  She so has Addison's disease is on steroids and has chronic pain using narcotics.  There are a lot of different reasons that she could have some nausea and vomiting as well as diarrhea.  Initially I recommended that an NG tube be placed however she did not tolerate a  placement of this.  I also wondered if she needed an enema But she had a large brown stool in her depends on exam when I saw her.  I am not sure there is any surgical reason for her symptoms but at this time would recommend keeping her n.p.o.  If she is not actively vomiting I do not think that she needs a reattempt at her NG tube.Continue resuscitation for her increase in creatinine.  Rest of management per primary team     Kandis Cocking 03/04/2023, 2:48 PM

## 2023-03-04 NOTE — ED Notes (Signed)
Provided perineal care, pt incontinent of stool, collected a sample and sent to lab. In addition, also provided with a lip moisturizer.

## 2023-03-05 ENCOUNTER — Inpatient Hospital Stay: Payer: PPO

## 2023-03-05 ENCOUNTER — Encounter: Payer: Self-pay | Admitting: Internal Medicine

## 2023-03-05 DIAGNOSIS — E872 Acidosis, unspecified: Secondary | ICD-10-CM | POA: Diagnosis not present

## 2023-03-05 DIAGNOSIS — K5641 Fecal impaction: Secondary | ICD-10-CM | POA: Diagnosis not present

## 2023-03-05 DIAGNOSIS — N179 Acute kidney failure, unspecified: Secondary | ICD-10-CM | POA: Diagnosis not present

## 2023-03-05 DIAGNOSIS — R112 Nausea with vomiting, unspecified: Secondary | ICD-10-CM | POA: Diagnosis not present

## 2023-03-05 DIAGNOSIS — R197 Diarrhea, unspecified: Secondary | ICD-10-CM | POA: Diagnosis not present

## 2023-03-05 LAB — CBC
HCT: 27.8 % — ABNORMAL LOW (ref 36.0–46.0)
Hemoglobin: 8.9 g/dL — ABNORMAL LOW (ref 12.0–15.0)
MCH: 31.7 pg (ref 26.0–34.0)
MCHC: 32 g/dL (ref 30.0–36.0)
MCV: 98.9 fL (ref 80.0–100.0)
Platelets: 184 10*3/uL (ref 150–400)
RBC: 2.81 MIL/uL — ABNORMAL LOW (ref 3.87–5.11)
RDW: 13.7 % (ref 11.5–15.5)
WBC: 5.5 10*3/uL (ref 4.0–10.5)
nRBC: 0 % (ref 0.0–0.2)

## 2023-03-05 LAB — GLUCOSE, CAPILLARY: Glucose-Capillary: 160 mg/dL — ABNORMAL HIGH (ref 70–99)

## 2023-03-05 LAB — BASIC METABOLIC PANEL
Anion gap: 11 (ref 5–15)
BUN: 40 mg/dL — ABNORMAL HIGH (ref 8–23)
CO2: 14 mmol/L — ABNORMAL LOW (ref 22–32)
Calcium: 8.7 mg/dL — ABNORMAL LOW (ref 8.9–10.3)
Chloride: 117 mmol/L — ABNORMAL HIGH (ref 98–111)
Creatinine, Ser: 1.29 mg/dL — ABNORMAL HIGH (ref 0.44–1.00)
GFR, Estimated: 43 mL/min — ABNORMAL LOW (ref >60)
Glucose, Bld: 69 mg/dL — ABNORMAL LOW (ref 70–99)
Potassium: 3.1 mmol/L — ABNORMAL LOW (ref 3.5–5.1)
Sodium: 142 mmol/L (ref 135–145)

## 2023-03-05 MED ORDER — POTASSIUM CHLORIDE CRYS ER 20 MEQ PO TBCR
40.0000 meq | EXTENDED_RELEASE_TABLET | ORAL | Status: AC
  Administered 2023-03-05 (×2): 40 meq via ORAL
  Filled 2023-03-05 (×2): qty 2

## 2023-03-05 MED ORDER — SENNOSIDES-DOCUSATE SODIUM 8.6-50 MG PO TABS
2.0000 | ORAL_TABLET | Freq: Two times a day (BID) | ORAL | Status: DC
  Administered 2023-03-05 – 2023-03-10 (×8): 2 via ORAL
  Filled 2023-03-05 (×9): qty 2

## 2023-03-05 MED ORDER — AMLODIPINE BESYLATE 5 MG PO TABS
2.5000 mg | ORAL_TABLET | Freq: Every day | ORAL | Status: DC
  Administered 2023-03-05 – 2023-03-10 (×6): 2.5 mg via ORAL
  Filled 2023-03-05 (×6): qty 1

## 2023-03-05 MED ORDER — TIZANIDINE HCL 4 MG PO TABS
4.0000 mg | ORAL_TABLET | Freq: Every day | ORAL | Status: DC
  Administered 2023-03-05 – 2023-03-09 (×5): 4 mg via ORAL
  Filled 2023-03-05 (×6): qty 1

## 2023-03-05 MED ORDER — CLONAZEPAM 0.5 MG PO TABS
0.5000 mg | ORAL_TABLET | Freq: Every evening | ORAL | Status: DC | PRN
  Administered 2023-03-05 – 2023-03-07 (×3): 0.5 mg via ORAL
  Filled 2023-03-05 (×3): qty 1

## 2023-03-05 MED ORDER — SODIUM BICARBONATE 8.4 % IV SOLN
INTRAVENOUS | Status: DC
  Filled 2023-03-05 (×2): qty 1000

## 2023-03-05 MED ORDER — GABAPENTIN 300 MG PO CAPS
300.0000 mg | ORAL_CAPSULE | Freq: Every day | ORAL | Status: DC
  Administered 2023-03-05 – 2023-03-10 (×6): 300 mg via ORAL
  Filled 2023-03-05 (×6): qty 1

## 2023-03-05 MED ORDER — LACTULOSE 10 GM/15ML PO SOLN
20.0000 g | Freq: Once | ORAL | Status: DC
  Filled 2023-03-05 (×2): qty 30

## 2023-03-05 MED ORDER — OXYCODONE-ACETAMINOPHEN 5-325 MG PO TABS
1.0000 | ORAL_TABLET | Freq: Four times a day (QID) | ORAL | Status: DC | PRN
  Administered 2023-03-05 – 2023-03-08 (×4): 1 via ORAL
  Filled 2023-03-05 (×4): qty 1

## 2023-03-05 NOTE — Evaluation (Signed)
Occupational Therapy Evaluation Patient Details Name: Katrina Rivera MRN: [id number] DOB: 1946-02-06 Today's Date: 03/05/2023   History of Present Illness Pt is a 78 y/o female presenting on 03/02/23 with abdominal pain as well as nausea and vomiting and diarrhea. PMH: addison's disease, anemia, anxiety, arthritis, chronic back pain, depression, fibromyalgia, hypotension, IBS, MS, peripheral neuropathy, syncope, spinal cord stimulator, prior back surgery.   Clinical Impression   Pt was seen for OT evaluation this date. Prior to hospital admission, pt reports using RW for mobility and spouse assisting with transfers and step into the home. Pt endorses that spouse provides assist for ADL and housekeeping but pt cooks (typically spouse will help bring things to a table where she can sit and prep). RN present and pt noted to have large amount of loose intermittent incontinent stool in bed. MIN A for repeated log rolling and MAX A for bed level pericare and LB bathing. Pt presents to acute OT demonstrating impaired ADL performance and functional mobility 2/2 decreased strength, activity tolerance, balance (See OT problem list for additional functional deficits). Pt currently requires increased assist for ADL and all aspects of mobility. Pt would benefit from skilled OT services to address noted impairments and functional limitations (see below for any additional details) in order to maximize safety and independence while minimizing falls risk and caregiver burden.     If plan is discharge home, recommend the following: A little help with walking and/or transfers;A lot of help with bathing/dressing/bathroom;Assistance with cooking/housework;Assist for transportation;Help with stairs or ramp for entrance;Direct supervision/assist for medications management;Supervision due to cognitive status;Direct supervision/assist for financial management    Functional Status Assessment  Patient has had a recent decline  in their functional status and demonstrates the ability to make significant improvements in function in a reasonable and predictable amount of time.  Equipment Recommendations  Other (comment) (defer)    Recommendations for Other Services       Precautions / Restrictions Precautions Precautions: Fall Restrictions Weight Bearing Restrictions Per Provider Order: No Other Position/Activity Restrictions: BM incontinence - should have pull ups from home in room that spouse brought      Mobility Bed Mobility Overal bed mobility: Needs Assistance Bed Mobility: Rolling Rolling: Min assist, Used rails              Transfers                   General transfer comment: deferred 2/2 actively incontinent of BM throughout session, spouse had not arrived yet with pt's pull ups, nursing in agreement      Balance                                           ADL either performed or assessed with clinical judgement   ADL Overall ADL's : Needs assistance/impaired                                       General ADL Comments: Pt required MOD A For LB bathing from bed level and bed level pericare involving rolling side to side, anticipate some assist required for mobility attempts EOB/OOB as well as increased assist required for LB ADL at this time.     Vision         Perception  Praxis         Pertinent Vitals/Pain Pain Assessment Pain Assessment: 0-10 Pain Score: 8  Pain Location: back Pain Descriptors / Indicators: Aching Pain Intervention(s): Monitored during session, Limited activity within patient's tolerance, Repositioned, Patient requesting pain meds-RN notified     Extremity/Trunk Assessment Upper Extremity Assessment Upper Extremity Assessment: Generalized weakness   Lower Extremity Assessment Lower Extremity Assessment: Generalized weakness       Communication Communication Communication: No apparent  difficulties   Cognition Arousal: Alert Behavior During Therapy: WFL for tasks assessed/performed Overall Cognitive Status: No family/caregiver present to determine baseline cognitive functioning                                 General Comments: pt very talktive, easily distracted requiring VC to redirect to task, difficulty with conversing during dual tasks     General Comments       Exercises     Shoulder Instructions      Home Living Family/patient expects to be discharged to:: Private residence Living Arrangements: Spouse/significant other Available Help at Discharge: Family Type of Home: House Home Access: Stairs to enter Secretary/administrator of Steps: 1 Entrance Stairs-Rails: None Home Layout: One level     Bathroom Shower/Tub: Chief Strategy Officer: Standard     Home Equipment: Agricultural consultant (2 wheels);Rollator (4 wheels);Shower seat;Grab bars - toilet;BSC/3in1;Wheelchair - manual;Cane - single point;Other (comment)   Additional Comments: adjustable bed      Prior Functioning/Environment Prior Level of Function : Needs assist;History of Falls (last six months)             Mobility Comments: ambulating wiht RW, assist for 1 step into house and spouse provides supervision for tub transfers ADLs Comments: Pt reports spouse assists with dressing and tub transfers. Pt cooks and manages her own medication. Spouse drives.        OT Problem List: Decreased strength;Pain;Decreased safety awareness;Decreased activity tolerance;Decreased knowledge of use of DME or AE      OT Treatment/Interventions: Self-care/ADL training;Therapeutic exercise;Therapeutic activities;DME and/or AE instruction;Balance training;Patient/family education;Energy conservation;Neuromuscular education;Cognitive remediation/compensation    OT Goals(Current goals can be found in the care plan section) Acute Rehab OT Goals Patient Stated Goal: go home OT Goal  Formulation: With patient Time For Goal Achievement: 03/19/23 Potential to Achieve Goals: Good ADL Goals Pt Will Perform Lower Body Dressing: sit to/from stand;with caregiver independent in assisting Pt Will Transfer to Toilet: with supervision;ambulating (PRN assist from caregiver, LRAD) Pt Will Perform Toileting - Clothing Manipulation and hygiene: with modified independence;sitting/lateral leans Additional ADL Goal #1: Pt will verbalize plan to implement at least 2 learned falls prevention/ECS into daily ADL routines to maximize safety/indep.  OT Frequency: Min 1X/week    Co-evaluation              AM-PAC OT "6 Clicks" Daily Activity     Outcome Measure Help from another person eating meals?: None Help from another person taking care of personal grooming?: A Little Help from another person toileting, which includes using toliet, bedpan, or urinal?: A Lot Help from another person bathing (including washing, rinsing, drying)?: A Lot Help from another person to put on and taking off regular upper body clothing?: A Little Help from another person to put on and taking off regular lower body clothing?: A Lot 6 Click Score: 16   End of Session    Activity Tolerance: Patient tolerated treatment well  Patient left: in bed;with call bell/phone within reach;with bed alarm set;with nursing/sitter in room  OT Visit Diagnosis: Other abnormalities of gait and mobility (R26.89);Muscle weakness (generalized) (M62.81);Repeated falls (R29.6);Pain Pain - Right/Left:  (back)                Time: 5784-6962 OT Time Calculation (min): 23 min Charges:  OT General Charges $OT Visit: 1 Visit OT Evaluation $OT Eval Moderate Complexity: 1 Mod OT Treatments $Self Care/Home Management : 8-22 mins  Arman Filter., MPH, MS, OTR/L ascom [phone] 03/05/23, 2:33 PM

## 2023-03-05 NOTE — TOC CM/SW Note (Signed)
Transition of Care [facility]) - Inpatient Brief Assessment   Patient Details  Name: Katrina Rivera MRN: [id number] Date of Birth: 1945/04/30  Transition of Care [facility]) CM/SW Contact:    Rodney Langton, RN Phone Number: 03/05/2023, 1:09 PM   Clinical Narrative:  Brief assessment done, no TOC needs identified at this time. Will follow, consult team if needs change.   Transition of Care Asessment: Insurance and Status: Insurance coverage has been reviewed Patient has primary care physician: Yes Home environment has been reviewed: Yes Prior level of function:: Recent invovlement with OPPT Prior/Current Home Services: No current home services Social Drivers of Health Review: SDOH reviewed no interventions necessary Readmission risk has been reviewed: Yes Transition of care needs: no transition of care needs at this time

## 2023-03-05 NOTE — Progress Notes (Signed)
The patient has a dressing to the RLE, in which she declines to have removed. Reports dressing changes to the RLE are per the wound clinic. Educated on the skin/wound assessment per the admission policy. Voices understanding.

## 2023-03-05 NOTE — Plan of Care (Signed)

## 2023-03-05 NOTE — Hospital Course (Signed)
Katrina Rivera is a 78 y.o. female with medical history significant of Addison's disease on chronic Cortef, colon cancer status post partial colectomy, chronic back pain on narcotics, multiple sclerosis peripheral neuropathy, HTN, anxiety/depression, presented with abdominal pain distention and diarrhea.  CT scan abdomen/pelvis showed large stool in rectum, consistent with fecal impaction.  Patient was given fluids and symptomatic treatment. Patient was given lactulose, she had a multiple loose stools, repeat KUB still has dilation in the colon as well as in the small bowel, consistent with ileus. 1/27. Patient has significant nausea and vomiting, started on Reglan.  Patient also has continued loose stools, to the point become incontinent.  However, repeat abdominal film still showed diffuse dilation of small bowel and colon.

## 2023-03-05 NOTE — Progress Notes (Signed)
  Progress Note   Patient: Katrina Rivera WUJ:[id number] DOB: 1945/12/23 DOA: 03/04/2023     1 DOS: the patient was seen and examined on 03/05/2023   Brief hospital course: POLLIE POMA is a 78 y.o. female with medical history significant of Addison's disease on chronic Cortef, colon cancer status post partial colectomy, chronic back pain on narcotics, multiple sclerosis peripheral neuropathy, HTN, anxiety/depression, presented with abdominal pain distention and diarrhea.  CT scan abdomen/pelvis showed large stool in rectum, consistent with fecal impaction.  Patient was given fluids and symptomatic treatment.   Principal Problem:   SBO (small bowel obstruction) (HCC) Active Problems:   MS (multiple sclerosis) (HCC)   Chronic adrenal insufficiency (HCC)   Hypokalemia   Nausea vomiting and diarrhea   Metabolic acidosis   AKI (acute kidney injury) (HCC)   Fecal impaction (HCC)   Assessment and Plan: Nausea vomiting secondary to fecal impaction. Small bowel obstruction ruled out. Patient has a chronic constipation alternating with diarrhea.  She came to the hospital with nausea vomiting and diarrhea, after arriving the hospital, she had a large bowel movement. Patient CT scan results was consistent with fecal impaction, not small bowel obstruction. Patient will be started on a diet, give enema.  Also give lactulose. Patient be followed today, probably will be discharged home tomorrow.  Acute kidney injury Metabolic acidosis. Hypokalemia. Patient will be treated with bicarb drip, potassium supplemented.  Hypoglycemia secondary to NPO and adrenal insufficiency. Addison disease with adrenal crisis. D5 water started, diet started.  Patient was also started on IV Solu-Cortef.  Continue to follow.  Multiple sclerosis. Follow-up with PCP as outpatient.  Currently patient does not have any worsening neurologic symptoms.     Subjective:  Patient had a large bowel movement today,  no longer has any nausea vomiting.  Physical Exam: Vitals:   03/04/23 2300 03/05/23 0000 03/05/23 0108 03/05/23 0830  BP: (!) 138/51 (!) 139/56 (!) 147/60 (!) 164/57  Pulse: 81 80 86   Resp:   20   Temp:   99.8 F (37.7 C) 98.4 F (36.9 C)  TempSrc:   Oral Oral  SpO2: 96% 97% 96% 98%  Weight:      Height:       General exam: Appears calm and comfortable  Respiratory system: Clear to auscultation. Respiratory effort normal. Cardiovascular system: S1 & S2 heard, RRR. No JVD, murmurs, rubs, gallops or clicks. No pedal edema. Gastrointestinal system: Abdomen is nondistended, soft and nontender. No organomegaly or masses felt. Normal bowel sounds heard. Central nervous system: Alert and oriented. No focal neurological deficits. Extremities: Symmetric 5 x 5 power. Skin: No rashes, lesions or ulcers Psychiatry: Judgement and insight appear normal. Mood & affect appropriate.    Data Reviewed:  Lab results reviewed.  Family Communication: None  Disposition: Status is: Inpatient Remains inpatient appropriate because: Severity of disease, IV treatment.     Time spent: 35 minutes  Author: Marrion Coy, MD 03/05/2023 12:28 PM  For on call review [email or web address].

## 2023-03-05 NOTE — Progress Notes (Signed)
CC: Possible small bowel obstruction Subjective: Patient reports doing much better today.  She has had multiple bowel movements yesterday.  She denies any nausea or vomiting.  She says that she feels less distended.  Objective: Vital signs in last 24 hours: Temp:  [98.4 F (36.9 C)-99.8 F (37.7 C)] 98.4 F (36.9 C) (01/26 0830) Pulse Rate:  [66-87] 86 (01/26 0108) Resp:  [16-20] 20 (01/26 0108) BP: (116-164)/(47-101) 164/57 (01/26 0830) SpO2:  [95 %-100 %] 98 % (01/26 0830) Last BM Date : 03/04/23  Intake/Output from previous day: 01/25 0701 - 01/26 0700 In: 2390.6 [I.V.:1390.6; IV Piggyback:1000] Out: -  Intake/Output this shift: No intake/output data recorded.  Physical exam:  Abdomen is soft, still distended with tympany but improved from yesterday.  Lab Results: CBC  Recent Labs    03/03/23 2052 03/05/23 0525  WBC 7.4 5.5  HGB 11.7* 8.9*  HCT 36.7 27.8*  PLT 228 184   BMET Recent Labs    03/03/23 2052 03/05/23 0525  NA 136 142  K 3.7 3.1*  CL 101 117*  CO2 21* 14*  GLUCOSE 150* 69*  BUN 28* 40*  CREATININE 1.75* 1.29*  CALCIUM 11.4* 8.7*   PT/INR No results for input(s): "LABPROT", "INR" in the last 72 hours. ABG No results for input(s): "PHART", "HCO3" in the last 72 hours.  Invalid input(s): "PCO2", "PO2"  Studies/Results: DG Abd 1 View Result Date: 03/05/2023 CLINICAL DATA:  78 year old female with abdominal distension. Subtotal colectomy, distal fecal impaction versus obstruction on CT yesterday. EXAM: ABDOMEN - 1 VIEW COMPARISON:  CT Abdomen and Pelvis yesterday, and earlier. FINDINGS: Portable AP supine views at 0530 hours. Chronic thoracic spinal stimulator device, multilevel lumbar decompression and fusion. Stable visualized osseous structures. Stable cholecystectomy clips. Stable lung bases. Unchanged bowel gas pattern from the CT yesterday. Continued vague increased density in the pelvis which yesterday was more related to dilated distal  colon than the urinary bladder. IMPRESSION: Gas pattern unchanged from the CT yesterday. Suspect ongoing impaction/ileus/obstruction as described on that exam. Electronically Signed   By: Odessa Fleming M.D.   On: 03/05/2023 06:42   CT ABDOMEN PELVIS WO CONTRAST Result Date: 03/04/2023 CLINICAL DATA:  Bowel obstruction. Nausea and diarrhea for 3 days. Vomiting. Abdominal cramping. EXAM: CT ABDOMEN AND PELVIS WITHOUT CONTRAST TECHNIQUE: Multidetector CT imaging of the abdomen and pelvis was performed following the standard protocol without IV contrast. RADIATION DOSE REDUCTION: This exam was performed according to the departmental dose-optimization program which includes automated exposure control, adjustment of the mA and/or kV according to patient size and/or use of iterative reconstruction technique. COMPARISON:  12/28/2022 FINDINGS: Lower chest: No acute findings. Hepatobiliary: No suspicious focal abnormality in the liver on this study without intravenous contrast. Gallbladder is surgically absent. Stable common bile duct measures 9 mm similar to minimally decreased from 10 mm previously. Pancreas: Mild prominence of the main pancreatic duct in the head of the pancreas is stable in the interval. Spleen: No splenomegaly. No suspicious focal mass lesion. Adrenals/Urinary Tract: No adrenal nodule or mass. Stable small nonobstructing stone lower pole right kidney. Left kidney unremarkable. No evidence for hydroureter. Posterior left bladder wall diverticulum again noted. Bladder otherwise unremarkable. Stomach/Bowel: Stomach is distended and filled with fluid. Duodenum is fluid-filled and distended. Proximal small bowel in the left abdomen is fluid-filled and distended up to about 3.5 cm diameter. Small bowel in the right abdomen is less distended. Patient is status post subtotal colectomy in the ileocolic anastomosis and rectum are markedly  dilated and filled with stool and fluid. Rectum measures 8.3 cm diameter.  Vascular/Lymphatic: There is advanced atherosclerotic calcification of the abdominal aorta without aneurysm. There is no gastrohepatic or hepatoduodenal ligament lymphadenopathy. No retroperitoneal or mesenteric lymphadenopathy. No pelvic sidewall lymphadenopathy. Reproductive: There is no adnexal mass. Other: No intraperitoneal free fluid. Musculoskeletal: No worrisome lytic or sclerotic osseous abnormality. IMPRESSION: 1. Status post subtotal colectomy. The enterocolic anastomosis and rectum are markedly dilated and filled with stool and fluid. Rectum measures 8.3 cm diameter. Stomach and proximal small bowel are fluid-filled with proximal small bowel distended up to about 3.5 cm diameter. Other small bowel loops are less distended. No overt small bowel transition zone can be identified. Given the large volume of fluid in stool in the rectum, features may reflect underlying component of fecal impaction. Severe ileus or rectal stricture would also be considerations. 2. Stable small nonobstructing stone lower pole right kidney. 3. Similar biliary dilatation status post cholecystectomy. Correlation with liver function test recommended. 4. Stable mild prominence of the main pancreatic duct in the head of the pancreas. 5.  Aortic Atherosclerosis (ICD10-I70.0). Electronically Signed   By: Kennith Center M.D.   On: 03/04/2023 06:19    Anti-infectives: Anti-infectives (From admission, onward)    None       Assessment/Plan:  Patient is a 78 year old with 3-day history of nausea and vomiting. She also has been having diarrhea. She has a complex past medical history including a subtotal colectomy for possibly MS but no one really knows the reason my guess is it is because she had some kind of functional problems secondary to her MS. She so has Addison's disease is on steroids and has chronic pain using narcotics   She is doing better today.  I reviewed her KUB and there is still some dilated loops of bowel with  some dilated distal colon and possible stool in the colon.  However, clinically she is less distended and does not have any nausea or vomiting.  She has had multiple bowel movements.  I am not sure that her etiology is from a small bowel obstruction rather than something possibly related to her MS as she has continued to have bowel movements throughout her nausea and vomiting episodes.  Nonetheless she looks better today and I think we can put her on a trial of clear liquid diets.  Baker Pierini, M.D. [facility]

## 2023-03-06 ENCOUNTER — Inpatient Hospital Stay: Payer: PPO

## 2023-03-06 DIAGNOSIS — E876 Hypokalemia: Secondary | ICD-10-CM

## 2023-03-06 DIAGNOSIS — K567 Ileus, unspecified: Secondary | ICD-10-CM | POA: Insufficient documentation

## 2023-03-06 DIAGNOSIS — E872 Acidosis, unspecified: Secondary | ICD-10-CM | POA: Diagnosis not present

## 2023-03-06 LAB — CBC
HCT: 27.3 % — ABNORMAL LOW (ref 36.0–46.0)
Hemoglobin: 9 g/dL — ABNORMAL LOW (ref 12.0–15.0)
MCH: 30.7 pg (ref 26.0–34.0)
MCHC: 33 g/dL (ref 30.0–36.0)
MCV: 93.2 fL (ref 80.0–100.0)
Platelets: 185 10*3/uL (ref 150–400)
RBC: 2.93 MIL/uL — ABNORMAL LOW (ref 3.87–5.11)
RDW: 13.8 % (ref 11.5–15.5)
WBC: 8.2 10*3/uL (ref 4.0–10.5)
nRBC: 0 % (ref 0.0–0.2)

## 2023-03-06 LAB — GLUCOSE, CAPILLARY
Glucose-Capillary: 169 mg/dL — ABNORMAL HIGH (ref 70–99)
Glucose-Capillary: 120 mg/dL — ABNORMAL HIGH (ref 70–99)
Glucose-Capillary: 136 mg/dL — ABNORMAL HIGH (ref 70–99)
Glucose-Capillary: 164 mg/dL — ABNORMAL HIGH (ref 70–99)

## 2023-03-06 LAB — BASIC METABOLIC PANEL
Anion gap: 9 (ref 5–15)
BUN: 27 mg/dL — ABNORMAL HIGH (ref 8–23)
CO2: 24 mmol/L (ref 22–32)
Calcium: 9 mg/dL (ref 8.9–10.3)
Chloride: 109 mmol/L (ref 98–111)
Creatinine, Ser: 0.89 mg/dL (ref 0.44–1.00)
GFR, Estimated: >60 mL/min (ref >60)
Glucose, Bld: 144 mg/dL — ABNORMAL HIGH (ref 70–99)
Potassium: 3.1 mmol/L — ABNORMAL LOW (ref 3.5–5.1)
Sodium: 142 mmol/L (ref 135–145)

## 2023-03-06 LAB — MAGNESIUM: Magnesium: 1.8 mg/dL (ref 1.7–2.4)

## 2023-03-06 MED ORDER — METOCLOPRAMIDE HCL 5 MG/ML IJ SOLN
5.0000 mg | Freq: Three times a day (TID) | INTRAMUSCULAR | Status: DC
  Administered 2023-03-06 – 2023-03-10 (×14): 5 mg via INTRAVENOUS
  Filled 2023-03-06 (×14): qty 2

## 2023-03-06 MED ORDER — KCL-LACTATED RINGERS 20 MEQ/L IV SOLN
INTRAVENOUS | Status: DC
  Filled 2023-03-06: qty 1000

## 2023-03-06 MED ORDER — PROCHLORPERAZINE EDISYLATE 10 MG/2ML IJ SOLN
5.0000 mg | Freq: Once | INTRAMUSCULAR | Status: AC
  Administered 2023-03-06: 5 mg via INTRAVENOUS
  Filled 2023-03-06: qty 2

## 2023-03-06 MED ORDER — QUETIAPINE FUMARATE 25 MG PO TABS
25.0000 mg | ORAL_TABLET | Freq: Every day | ORAL | Status: DC
  Administered 2023-03-06 – 2023-03-09 (×4): 25 mg via ORAL
  Filled 2023-03-06 (×4): qty 1

## 2023-03-06 MED ORDER — POTASSIUM CHLORIDE CRYS ER 20 MEQ PO TBCR
40.0000 meq | EXTENDED_RELEASE_TABLET | ORAL | Status: AC
  Administered 2023-03-06 (×2): 40 meq via ORAL
  Filled 2023-03-06 (×2): qty 2

## 2023-03-06 MED ORDER — KCL-LACTATED RINGERS-D5W 20 MEQ/L IV SOLN
INTRAVENOUS | Status: AC
Start: 2023-03-06 — End: 2023-03-07
  Filled 2023-03-06 (×3): qty 1000

## 2023-03-06 MED ORDER — POTASSIUM PHOSPHATES 15 MMOLE/5ML IV SOLN
45.0000 mmol | Freq: Once | INTRAVENOUS | Status: AC
  Administered 2023-03-06: 45 mmol via INTRAVENOUS
  Filled 2023-03-06: qty 15

## 2023-03-06 MED ORDER — POTASSIUM CHLORIDE 2 MEQ/ML IV SOLN
INTRAVENOUS | Status: DC
  Filled 2023-03-06: qty 1000

## 2023-03-06 NOTE — Progress Notes (Signed)
Pt vomitted approx 100cc's liquid. Jawo NP paged for nausea med.

## 2023-03-06 NOTE — Progress Notes (Signed)
  Progress Note   Patient: Katrina Rivera [id number] DOB: 03/01/1945 DOA: 03/04/2023     2 DOS: the patient was seen and examined on 03/06/2023   Brief hospital course: Katrina Rivera is a 78 y.o. female with medical history significant of Addison's disease on chronic Cortef, colon cancer status post partial colectomy, chronic back pain on narcotics, multiple sclerosis peripheral neuropathy, HTN, anxiety/depression, presented with abdominal pain distention and diarrhea.  CT scan abdomen/pelvis showed large stool in rectum, consistent with fecal impaction.  Patient was given fluids and symptomatic treatment. Patient was given lactulose, she had a multiple loose stools, repeat KUB still has dilation in the colon as well as in the small bowel, consistent with ileus.   Principal Problem:   SBO (small bowel obstruction) (HCC) Active Problems:   MS (multiple sclerosis) (HCC)   Chronic adrenal insufficiency (HCC)   Hypokalemia   Nausea vomiting and diarrhea   Metabolic acidosis   AKI (acute kidney injury) (HCC)   Fecal impaction (HCC)   Ileus (HCC)   Assessment and Plan:  Nausea vomiting secondary to fecal impaction. Ileus. Patient has a chronic constipation alternating with diarrhea.  She came to the hospital with nausea vomiting and diarrhea, after arriving the hospital, she had a large bowel movement. Patient CT scan results was consistent with fecal impaction. Patient was giving lactulose, having multiple loose stools, but is repeated KUB still showed diffuse dilation including colon and small bowel. Discussed with general surgery, patient put back on n.p.o.  Continue IV fluids.  Also started on Reglan due to significant nausea vomiting.   Acute kidney injury Metabolic acidosis. Hypokalemia. Hypophosphatemia. Phosphate less than 1.0, giving potassium phosphate 45 mmol.  Also added potassium into the fluids.   Hypoglycemia secondary to NPO and adrenal insufficiency. Addison  disease with adrenal crisis. Fluids changed to D5 lactated Ringer with 20 mEq of KCl.   Multiple sclerosis. Follow-up with PCP as outpatient.  Currently patient does not have any worsening neurologic symptoms.      Subjective:  Patient still has significant nausea vomiting, she had a multiple loose stools overnight.  Physical Exam: Vitals:   03/05/23 1621 03/05/23 2016 03/06/23 0405 03/06/23 0913  BP: (!) 135/54 (!) 156/65 (!) 157/65 (!) 168/62  Pulse: 75 78 66 70  Resp: 18 18 18    Temp: 98.2 F (36.8 C) 99 F (37.2 C) 99 F (37.2 C)   TempSrc:  Oral Oral   SpO2: 98% 97% 100% 99%  Weight:      Height:       General exam: Appears calm and comfortable  Respiratory system: Clear to auscultation. Respiratory effort normal. Cardiovascular system: S1 & S2 heard, RRR. No JVD, murmurs, rubs, gallops or clicks. No pedal edema. Gastrointestinal system: Abdomen is nondistended, soft and nontender. No organomegaly or masses felt. Normal bowel sounds heard. Central nervous system: Alert and oriented x3. No focal neurological deficits. Extremities: Symmetric 5 x 5 power. Skin: No rashes, lesions or ulcers Psychiatry:  Mood & affect appropriate.    Data Reviewed:  X-ray and lab results reviewed.  Family Communication: None  Disposition: Status is: Inpatient Remains inpatient appropriate because: Severity of disease, IV treatment.     Time spent: 35 minutes  Author: Marrion Coy, MD 03/06/2023 12:16 PM  For on call review [email or web address].

## 2023-03-06 NOTE — Progress Notes (Signed)
Pt vomitted on gown and bed pad, 2 pills noted on bedside table that pt stated she threw up. Unable to identify which pills they were.

## 2023-03-06 NOTE — Progress Notes (Signed)
[facility] SURGICAL PROGRESS NOTE (cpt [phone])  Hospital Day(s): 2.   Interval History: Patient seen and examined. Overnight, she had return of emesis which has persisted into this AM. No significant abdominal pain but she remains distended. No fever, chills. She is without leukocytosis; WBC 8.2K. Hgb to 9.0; stable. Renal function; sCr - 0.89; UO - unmeasured. Hypokalemia to 3.1. Hypophosphatemia to <1.0. No new imaging this AM. She is on heart healthy diet. She continues to have bowel movements.   Review of Systems:  Constitutional: denies fever, chills  HEENT: denies cough or congestion  Respiratory: denies any shortness of breath  Cardiovascular: denies chest pain or palpitations  Gastrointestinal: + nausea, + emesis, + distension, denied abdominal pain Genitourinary: denies burning with urination or urinary frequency Musculoskeletal: denies pain, decreased motor or sensation  Vital signs in last 24 hours: [min-max] current  Temp:  [98.2 F (36.8 C)-99 F (37.2 C)] 99 F (37.2 C) (01/27 0405) Pulse Rate:  [66-78] 66 (01/27 0405) Resp:  [18] 18 (01/27 0405) BP: (135-164)/(54-65) 157/65 (01/27 0405) SpO2:  [97 %-100 %] 100 % (01/27 0405)     Height: 5' (152.4 cm) Weight: 44.9 kg BMI (Calculated): 19.33   Intake/Output last 2 shifts:  01/26 0701 - 01/27 0700 In: 987.5 [I.V.:987.5] Out: -    Physical Exam:  Constitutional: alert, cooperative and no distress  HENT: normocephalic without obvious abnormality  Eyes: PERRL, EOM's grossly intact and symmetric  Respiratory: breathing non-labored at rest  Cardiovascular: regular rate and sinus rhythm  Gastrointestinal: soft, non-tender, she is distended, no rebound/guarding. She is not peritonitic Musculoskeletal: no edema or wounds, motor and sensation grossly intact, NT    Labs:     Latest Ref Rng & Units 03/06/2023    5:58 AM 03/05/2023    5:25 AM 03/03/2023    8:52 PM  CBC  WBC 4.0 - 10.5 K/uL 8.2  5.5  7.4    Hemoglobin 12.0 - 15.0 g/dL 9.0  8.9  60.4   Hematocrit 36.0 - 46.0 % 27.3  27.8  36.7   Platelets 150 - 400 K/uL 185  184  228       Latest Ref Rng & Units 03/06/2023    5:58 AM 03/05/2023    5:25 AM 03/03/2023    8:52 PM  CMP  Glucose 70 - 99 mg/dL 540  69  981   BUN 8 - 23 mg/dL 27  40  28   Creatinine 0.44 - 1.00 mg/dL 1.91  4.78  2.95   Sodium 135 - 145 mmol/L 142  142  136   Potassium 3.5 - 5.1 mmol/L 3.1  3.1  3.7   Chloride 98 - 111 mmol/L 109  117  101   CO2 22 - 32 mmol/L 24  14  21    Calcium 8.9 - 10.3 mg/dL 9.0  8.7  62.1   Total Protein 6.5 - 8.1 g/dL   7.3   Total Bilirubin 0.0 - 1.2 mg/dL   0.8   Alkaline Phos 38 - 126 U/L   47   AST 15 - 41 U/L   40   ALT 0 - 44 U/L   35     Imaging studies:   KUB (03/06/2023) personally reviewed with worsening bowel distension, no pneumatosis, no free air, and radiologist report pending...    Assessment/Plan: (ICD-10's: K15.609) 78 y.o. female with question of SBO on admission although continues to have bowel function, now with what appears to be worsening bowel  dilation on KUB this AM which may be sequela of MS vs severe electrolyte derangements vs constipation   - She has had some regression with nausea/emesis overnight and her KUB this morning continues to have dilated bowel. She is passing gas and stool, so clinically not behaving like true obstruction. She does have significant electrolyte derangements this morning which are likely a factor and need corrected. This may also be a functional issue and sequela of her MS. For now, would back down to NPO; okay for sips with medications/ice chips/water. If vomiting persists, she may need NGT. Monitor abdominal examination. Serial KUB as needed. She does not need surgical intervention at this time. We will continue to follow along.    All of the above findings and recommendations were discussed with the patient, and the medical team, and all of patient's questions were answered to her  expressed satisfaction.  -- Lynden Oxford, PA-C [facility] 03/06/2023, 7:24 AM M-F: 7am - 4pm

## 2023-03-07 ENCOUNTER — Inpatient Hospital Stay: Payer: PPO

## 2023-03-07 DIAGNOSIS — G35 Multiple sclerosis: Secondary | ICD-10-CM

## 2023-03-07 DIAGNOSIS — K567 Ileus, unspecified: Secondary | ICD-10-CM

## 2023-03-07 DIAGNOSIS — E878 Other disorders of electrolyte and fluid balance, not elsewhere classified: Secondary | ICD-10-CM | POA: Diagnosis not present

## 2023-03-07 DIAGNOSIS — E272 Addisonian crisis: Secondary | ICD-10-CM | POA: Diagnosis not present

## 2023-03-07 DIAGNOSIS — K6389 Other specified diseases of intestine: Secondary | ICD-10-CM

## 2023-03-07 LAB — GLUCOSE, CAPILLARY
Glucose-Capillary: 117 mg/dL — ABNORMAL HIGH (ref 70–99)
Glucose-Capillary: 138 mg/dL — ABNORMAL HIGH (ref 70–99)
Glucose-Capillary: 108 mg/dL — ABNORMAL HIGH (ref 70–99)
Glucose-Capillary: 119 mg/dL — ABNORMAL HIGH (ref 70–99)

## 2023-03-07 LAB — IRON AND TIBC
Iron: 47 ug/dL (ref 28–170)
Saturation Ratios: 18 % (ref 10.4–31.8)
TIBC: 266 ug/dL (ref 250–450)
UIBC: 219 ug/dL

## 2023-03-07 LAB — BASIC METABOLIC PANEL
Anion gap: 6 (ref 5–15)
BUN: 18 mg/dL (ref 8–23)
CO2: 22 mmol/L (ref 22–32)
Calcium: 9 mg/dL (ref 8.9–10.3)
Chloride: 114 mmol/L — ABNORMAL HIGH (ref 98–111)
Creatinine, Ser: 0.86 mg/dL (ref 0.44–1.00)
GFR, Estimated: >60 mL/min (ref >60)
Glucose, Bld: 124 mg/dL — ABNORMAL HIGH (ref 70–99)
Potassium: 3.2 mmol/L — ABNORMAL LOW (ref 3.5–5.1)
Sodium: 142 mmol/L (ref 135–145)

## 2023-03-07 LAB — PHOSPHORUS
Phosphorus: <1 mg/dL — CL (ref 2.5–4.6)
Phosphorus: 1.7 mg/dL — ABNORMAL LOW (ref 2.5–4.6)

## 2023-03-07 LAB — FERRITIN: Ferritin: 124 ng/mL (ref 11–307)

## 2023-03-07 LAB — MAGNESIUM: Magnesium: 1.7 mg/dL (ref 1.7–2.4)

## 2023-03-07 LAB — VITAMIN B12: Vitamin B-12: 2125 pg/mL — ABNORMAL HIGH (ref 180–914)

## 2023-03-07 MED ORDER — POTASSIUM CHLORIDE 20 MEQ PO PACK
40.0000 meq | PACK | ORAL | Status: DC
  Administered 2023-03-07: 40 meq via ORAL
  Filled 2023-03-07: qty 2

## 2023-03-07 MED ORDER — KCL-LACTATED RINGERS-D5W 20 MEQ/L IV SOLN
INTRAVENOUS | Status: DC
  Filled 2023-03-07 (×4): qty 1000

## 2023-03-07 MED ORDER — MAGNESIUM SULFATE 2 GM/50ML IV SOLN
2.0000 g | Freq: Once | INTRAVENOUS | Status: AC
  Administered 2023-03-07: 2 g via INTRAVENOUS
  Filled 2023-03-07: qty 50

## 2023-03-07 MED ORDER — POTASSIUM CHLORIDE CRYS ER 20 MEQ PO TBCR
40.0000 meq | EXTENDED_RELEASE_TABLET | ORAL | Status: AC
  Administered 2023-03-07 (×2): 40 meq via ORAL
  Filled 2023-03-07 (×2): qty 2

## 2023-03-07 MED ORDER — POTASSIUM PHOSPHATES 15 MMOLE/5ML IV SOLN
30.0000 mmol | Freq: Once | INTRAVENOUS | Status: AC
  Administered 2023-03-07: 30 mmol via INTRAVENOUS
  Filled 2023-03-07: qty 10

## 2023-03-07 NOTE — Progress Notes (Signed)
Patient does not want to place rectal tube at this time. Patient has had 2 continent (on bedside commode), episodes of diarrhea with significant amount of passing gas each time. Patient educated on what the rectal tube is, and that she can choose at any time to have it placed if she changes her mind to let the RN know. Patient encouraged to continue ambulating, and call for assistance to go [facility].

## 2023-03-07 NOTE — Consult Note (Signed)
Midge Minium, MD [facility]  [address] [place], [place] [address] Phone: [phone] Fax : [phone]  Consultation  Referring Provider:     Dr. Chipper Herb Primary Care Physician:  Noberto Retort, MD Primary Gastroenterologist:  Dr.   Ewing Schlein       Reason for Consultation:     Ileus  Date of Admission:  03/04/2023 Date of Consultation:  03/07/2023         HPI:   Katrina Rivera is a 78 y.o. female with a history of MS who reports that probably 2 weeks ago she started to have decreased p.o. intake and started to feel like she had a bout of gastroenteritis.  She has a history of having colon surgery with part of her colon removed 30 years ago.  She states that this was done in [place].  The patient has been evaluated by surgery back on the 25th of this month.  The patient has a history of chronic pain on chronic narcotics.  She reports that she has had alternating diarrhea and constipation in the past.  She also reports that she had a recent fall.  The patient came in with nausea and vomiting and was thought to have possible viral gastroenteritis and a CT scan was done.  The findings showed:  IMPRESSION: 1. Status post subtotal colectomy. The enterocolic anastomosis and rectum are markedly dilated and filled with stool and fluid. Rectum measures 8.3 cm diameter. Stomach and proximal small bowel are fluid-filled with proximal small bowel distended up to about 3.5 cm diameter. Other small bowel loops are less distended. No overt small bowel transition zone can be identified. Given the large volume of fluid in stool in the rectum, features may reflect underlying component of fecal impaction. Severe ileus or rectal stricture would also be considerations. 2. Stable small nonobstructing stone lower pole right kidney. 3. Similar biliary dilatation status post cholecystectomy. Correlation with liver function test recommended. 4. Stable mild prominence of the main pancreatic duct  in the head of the pancreas. 5.  Aortic Atherosclerosis  From the surgical notes there was a report that they found in her chart that she had colon cancer although the patient had not confirmed that with them.  The patient and her husband report that she has had no further vomiting since Sunday.  The patient's labs have shown the patient to have a low phosphorus yesterday at less than 1 and still decreased at 1.7 today.  The patient's potassium is also low at 3.2.  The patient denies any black stools or bloody stools.  The patient does report passing gas and having soft bowel movements.  Past Medical History:  Diagnosis Date   Addison's disease (HCC)    takes Solu Cortef daily   Anemia    takes Ferrous Sulfate daily   Anxiety    takes Xanax nightly   Arthritis    Chronic back pain    stenosis   Depression    takes Cymbalta daily   Dizziness    if b/p drops    Fibromyalgia    History of blood transfusion    no abnormal reaction noted   History of bronchitis    many yrs ago    Hypokalemia    takes Potassium daily   Hypotension    takes Florinef daily   IBS (irritable bowel syndrome)    takes Align daily   Insomnia    takes Trazodone nightly   Joint pain  Multiple sclerosis (HCC)    doesn't take any meds   Multiple sclerosis (HCC)    Nausea vomiting and diarrhea 03/04/2023   New onset headache 12/22/2021   Nocturia    Osteoporosis    Palpitations    Peripheral neuropathy    Primary localized osteoarthritis of left knee 08/11/2020   Restless leg syndrome    Seasonal allergies    takes Zyrtec daily;uses Flonase daily as needed   Syncope    when missed methylprednisolone dose   Urinary frequency    takes Flomax daily   Weakness    numbness and tingling    Past Surgical History:  Procedure Laterality Date   ABDOMINAL HYSTERECTOMY  02/07/1986   APPENDECTOMY  02/07/1986   CESAREAN SECTION  1973/1977   x2   CHOLECYSTECTOMY  02/08/1995   COLECTOMY  02/08/1988    COLONOSCOPY     ESOPHAGOGASTRODUODENOSCOPY     EYE SURGERY     bilateral - /w IOL- cataracts   FRACTURE SURGERY Right    rods and screws-right leg   LUMBAR LAMINECTOMY/DECOMPRESSION MICRODISCECTOMY Left 01/24/2013   Procedure: LUMBAR FIVE TO SACRAL ONE LUMBAR LAMINECTOMY/DECOMPRESSION MICRODISCECTOMY 1 LEVEL;  Surgeon: Tia Alert, MD;  Location: [facility];  Service: Neurosurgery;  Laterality: Left;   MAXIMUM ACCESS (MAS)POSTERIOR LUMBAR INTERBODY FUSION (PLIF) 1 LEVEL N/A 06/18/2014   Procedure: MAXIMUM ACCESS SURGERY POSTERIOR LUMBAR INTERBODY FUSION LUMBAR FIVE TO SACRAL ONE ;  Surgeon: Tia Alert, MD;  Location: [facility];  Service: Neurosurgery;  Laterality: N/A;   SPINAL CORD STIMULATOR BATTERY EXCHANGE Right 07/30/2021   Procedure: Spinal cord stimulator battery replacement;  Surgeon: Tia Alert, MD;  Location[facility];  Service: Neurosurgery;  Laterality: Right;   SPINAL CORD STIMULATOR IMPLANT     TOTAL KNEE ARTHROPLASTY Left 08/24/2020   Procedure: TOTAL KNEE ARTHROPLASTY;  Surgeon: Salvatore Marvel, MD;  Location: [facility];  Service: Orthopedics;  Laterality: Left;    Prior to Admission medications   Medication Sig Start Date End Date Taking? Authorizing Provider  ondansetron (ZOFRAN) 4 MG tablet Take 1 tablet (4 mg total) by mouth every 8 (eight) hours as needed for nausea or vomiting. 03/04/23  Yes Pilar Jarvis, MD  acetaminophen (TYLENOL) 325 MG tablet Take 1-2 tablets (325-650 mg total) by mouth every 4 (four) hours as needed for mild pain. 08/29/22   Setzer, Lynnell Jude, PA-C  albuterol (VENTOLIN HFA) 108 (90 Base) MCG/ACT inhaler Inhale 2 puffs into the lungs every 6 (six) hours as needed for wheezing. 11/07/22   [provider]  alfuzosin (UROXATRAL) 10 MG 24 hr tablet Take 10 mg by mouth daily with breakfast.    [provider]  amLODipine (NORVASC) 2.5 MG tablet Take 1 tablet (2.5 mg total) by mouth daily. 08/29/22   Setzer, Lynnell Jude, PA-C   bethanechol (URECHOLINE) 10 MG tablet Take 10 mg by mouth 2 (two) times daily. 12/12/22   [provider]  buPROPion (WELLBUTRIN XL) 300 MG 24 hr tablet Take 300 mg by mouth every morning. 12/13/22   [provider]  cephALEXin (KEFLEX) 250 MG capsule Take 250 mg by mouth every other day. 06/04/22   [provider]  cholecalciferol (VITAMIN D3) 25 MCG (1000 UNIT) tablet Take 1,000 Units by mouth every morning.    [provider]  clonazePAM (KLONOPIN) 0.5 MG tablet Take 1 tablet (0.5 mg total) by mouth at bedtime as needed for anxiety. Patient taking differently: Take 0.5 mg by mouth at bedtime. 08/04/22  Glean Salvo, NP  CRANBERRY PO Take 1 tablet by mouth 2 (two) times daily.    [provider]  dicyclomine (BENTYL) 20 MG tablet Take 1 tablet by mouth 4 (four) times daily as needed for spasms.    [provider]  DULoxetine (CYMBALTA) 60 MG capsule Take 1 capsule (60 mg total) by mouth daily. Patient not taking: Reported on 02/24/2023 05/17/22   Levert Feinstein, MD  ferrous sulfate 325 (65 FE) MG tablet Take 325 mg by mouth daily with breakfast.    [provider]  fludrocortisone (FLORINEF) 0.1 MG tablet Take 1/2 tablet daily. 02/24/23   Thapa, [place], MD  fluticasone (CUTIVATE) 0.05 % cream Apply 1 application  topically 2 (two) times daily as needed for irritation (Rosacea). 06/09/21   [provider]  fluticasone (FLONASE) 50 MCG/ACT nasal spray Place 1 spray into both nostrils daily as needed for allergies or rhinitis.    [provider]  gabapentin (NEURONTIN) 300 MG capsule Take 3 capsules (900 mg total) by mouth at bedtime. Patient not taking: Reported on 02/24/2023 08/29/22   Milinda Antis, PA-C  gabapentin (NEURONTIN) 300 MG capsule Take 1 capsule (300 mg total) by mouth daily at 6 (six) AM. Patient not taking: Reported on 02/24/2023 08/29/22   Milinda Antis, PA-C  Gabapentin Enacarbil (HORIZANT PO) Take by mouth.     [provider]  HYDROcodone-acetaminophen (NORCO) 7.5-325 MG tablet Take 1 tablet by mouth every 4 (four) hours as needed for moderate pain ((score 4 to 6)). 08/29/22   Setzer, Lynnell Jude, PA-C  HYDROcodone-acetaminophen (NORCO/VICODIN) 5-325 MG tablet Take 1 tablet by mouth every 8 (eight) hours as needed for moderate pain (pain score 4-6).    [provider]  methylPREDNISolone (MEDROL) 4 MG tablet as directed Orally 1 tablet AM and 1/2 tablet afternoon and take additional in case of illness as instructed. 02/24/23   Thapa, [place], MD  Multiple Vitamin (MULTIVITAMIN) tablet Take 1 tablet by mouth in the morning.    [provider]  ondansetron (ZOFRAN) 4 MG tablet Take 1 tablet (4 mg total) by mouth every 8 (eight) hours as needed for nausea or vomiting. 02/15/23   Lovorn, Aundra Millet, MD  oxyCODONE-acetaminophen (PERCOCET) 10-325 MG tablet Take 1 tablet by mouth every 4 (four) hours as needed for pain.    [provider]  oxyCODONE-acetaminophen (PERCOCET/ROXICET) 5-325 MG tablet Take 1 tablet by mouth every 6 (six) hours as needed for severe pain (pain score 7-10).    [provider]  pantoprazole (PROTONIX) 20 MG tablet Take 20 mg by mouth every morning. 12/12/22   [provider]  Polyethyl Glycol-Propyl Glycol (SYSTANE OP) Place 2 drops into both eyes daily as needed (for dry eyes).    [provider]  polyethylene glycol (MIRALAX) 17 g packet Take 17 g by mouth 2 (two) times daily. 17 grams in 6 oz of favorite drink twice a day until bowel movement.  LAXITIVE.  Restart if two days since last bowel movement Patient taking differently: Take 17 g by mouth daily as needed for moderate constipation. 08/24/20   Shepperson, Kirstin, PA-C  Probiotic Product (PROBIOTIC PO) Take 1 capsule by mouth every morning.    [provider]  promethazine (PHENERGAN) 25 MG tablet Take 25 mg by mouth every 6 (six) hours as needed for nausea or vomiting.     [provider]  tiZANidine (ZANAFLEX) 2 MG tablet Take 2 tablets (4 mg total) by mouth every 8 (  eight) hours as needed for muscle spasms. Patient taking differently: Take 4 mg by mouth at bedtime. 06/23/22   Arman Bogus, MD  topiramate (TOPAMAX) 50 MG tablet Take 1 tablet (50 mg total) by mouth at bedtime. X 1 week, then 75 mg nightly x 1 week, ,then 100 mg at bedtime- for prevention of headaches 02/15/23   [name], MD    Family History  Problem Relation Age of Onset   Hypertension Mother    Stroke Mother    Heart attack Father    Tremor Brother      Social History   Tobacco Use   Smoking status: Never   Smokeless tobacco: Never  Vaping Use   Vaping status: Never Used  Substance Use Topics   Alcohol use: Never   Drug use: Never    Allergies as of 03/03/2023 - Review Complete 03/03/2023  Allergen Reaction Noted   Amoxicillin Other (See Comments) 07/09/2021   Aspirin  08/04/2020   Azithromycin  05/14/2020   Doxycycline hyclate  05/14/2020   Hydroxyzine  07/25/2022   Hydroxyzine hcl Other (See Comments) 07/25/2022   Buspirone Palpitations 05/14/2020   Buspirone hcl Palpitations 05/14/2020   Mirtazapine Palpitations 05/14/2020    Review of Systems:    All systems reviewed and negative except where noted in HPI.   Physical Exam:  Vital signs in last 24 hours: Temp:  [97.8 F (36.6 C)-98 F (36.7 C)] 97.8 F (36.6 C) (01/28 0930) Pulse Rate:  [68-73] 73 (01/28 0930) Resp:  [17-18] 18 (01/28 0930) BP: (147-168)/(68-73) 163/73 (01/28 0930) SpO2:  [99 %-100 %] 99 % (01/28 0930) Last BM Date : 03/07/23 General:   Pleasant, cooperative in NAD Head:  Normocephalic and atraumatic. Eyes:   No icterus.   Conjunctiva pink. PERRLA. Ears:  Normal auditory acuity. Neck:  Supple; no masses or thyroidomegaly Lungs: Respirations even and unlabored. Lungs clear to auscultation bilaterally.   No wheezes, crackles, or rhonchi.  Heart:  Regular rate and rhythm;   Without murmur, clicks, rubs or gallops Abdomen:  Soft, positive distention, nontender.  Decreased bowel sounds. No appreciable masses or hepatomegaly.  No rebound or guarding.  Rectal:  Not performed. Msk:  Symmetrical without gross deformities.    Extremities:  Without edema, cyanosis or clubbing. Neurologic:  Alert and oriented x3;  grossly normal neurologically. Skin:  Intact without significant lesions or rashes. Cervical Nodes:  No significant cervical adenopathy. Psych:  Alert and cooperative. Normal affect.  LAB RESULTS: Recent Labs    03/05/23 0525 03/06/23 0558  WBC 5.5 8.2  HGB 8.9* 9.0*  HCT 27.8* 27.3*  PLT 184 185   BMET Recent Labs    03/05/23 0525 03/06/23 0558 03/07/23 0529  NA 142 142 142  K 3.1* 3.1* 3.2*  CL 117* 109 114*  CO2 14* 24 22  GLUCOSE 69* 144* 124*  BUN 40* 27* 18  CREATININE 1.29* 0.89 0.86  CALCIUM 8.7* 9.0 9.0   LFT No results for input(s): "PROT", "ALBUMIN", "AST", "ALT", "ALKPHOS", "BILITOT", "BILIDIR", "IBILI" in the last 72 hours. PT/INR No results for input(s): "LABPROT", "INR" in the last 72 hours.  STUDIES: DG Abd 1 View Result Date: 03/07/2023 CLINICAL DATA:  Ileus EXAM: ABDOMEN - 1 VIEW COMPARISON:  03/06/2023 FINDINGS: Persisting gaseous distension of both large and small bowel loops throughout the abdomen. Small bowel dilation of a loop within the left hemiabdomen measuring up to 4.7 cm in diameter, similar to prior. No gross free intraperitoneal air on supine imaging.  IMPRESSION: Persisting gaseous distension of both large and small bowel loops throughout the abdomen, similar to prior. Electronically Signed   By: Duanne Guess D.O.   On: 03/07/2023 09:09   DG Abd 1 View Result Date: 03/06/2023 CLINICAL DATA:  [id number] SBO (small bowel obstruction) (HCC) [id number] EXAM: ABDOMEN - 1 VIEW COMPARISON:  03/05/2023 FINDINGS: Persistent gaseous distension of large and small bowel loops throughout the abdomen. Gaseous distension of the  rectum. No gross free intraperitoneal air on supine imaging. IMPRESSION: Persistent gaseous distension of large and small bowel loops throughout the abdomen, compatible with ileus or obstruction. Electronically Signed   By: Duanne Guess D.O.   On: 03/06/2023 11:46      Impression / Plan:   Assessment: Principal Problem:   SBO (small bowel obstruction) (HCC) Active Problems:   MS (multiple sclerosis) (HCC)   Chronic adrenal insufficiency (HCC)   Hypokalemia   Nausea vomiting and diarrhea   Metabolic acidosis   AKI (acute kidney injury) (HCC)   Fecal impaction (HCC)   Ileus (HCC)   Katrina Rivera is a 78 y.o. y/o female with ileus and a distended abdomen with a history of MS.  The patient denies any abdominal pain but does not have an appetite.  She has been passing gas but has multiple electrolyte abnormalities that have not been corrected.  I am now being asked to see the patient for her ileus.  Plan:  The patient should have her electrolytes corrected which is likely contributing to her ileus.  The patient will also have a rectal tube placed due to her being able to pass gas but not completely eliminate her intestinal gas.  Once the patient electrolytes are corrected and the patient has had rectal tube in place that she is not responding to the treatment then using MiraLAX would be the next step to help move things along.  The patient and her husband have explained the plan and agree with it.  Thank you for involving me in the care of this patient.      LOS: 3 days   Midge Minium, MD, MD. Clementeen Graham 03/07/2023, 2:42 PM,  Pager [phone] 7am-5pm  Check AMION for 5pm -7am coverage and on weekends   Note: This dictation was prepared with Dragon dictation along with smaller phrase technology. Any transcriptional errors that result from this process are unintentional.

## 2023-03-07 NOTE — Progress Notes (Signed)
Progress Note   Patient: Katrina Rivera QIO:[id number] DOB: 1945-10-25 DOA: 03/04/2023     3 DOS: the patient was seen and examined on 03/07/2023   Brief hospital course: Katrina Rivera is a 78 y.o. female with medical history significant of Addison's disease on chronic Cortef, colon cancer status post partial colectomy, chronic back pain on narcotics, multiple sclerosis peripheral neuropathy, HTN, anxiety/depression, presented with abdominal pain distention and diarrhea.  CT scan abdomen/pelvis showed large stool in rectum, consistent with fecal impaction.  Patient was given fluids and symptomatic treatment. Patient was given lactulose, she had a multiple loose stools, repeat KUB still has dilation in the colon as well as in the small bowel, consistent with ileus. 1/27. Patient has significant nausea and vomiting, started on Reglan.  Patient also has continued loose stools, to the point become incontinent.  However, repeat abdominal film still showed diffuse dilation of small bowel and colon.   Principal Problem:   SBO (small bowel obstruction) (HCC) Active Problems:   MS (multiple sclerosis) (HCC)   Chronic adrenal insufficiency (HCC)   Hypokalemia   Nausea vomiting and diarrhea   Metabolic acidosis   AKI (acute kidney injury) (HCC)   Fecal impaction (HCC)   Ileus (HCC)   Assessment and Plan: Nausea vomiting secondary to fecal impaction. Ileus. Patient has a chronic constipation alternating with diarrhea.  She came to the hospital with nausea vomiting and stool in the rectum, after arriving the hospital, she had multiple large bowel movements. Patient CT scan results was consistent with fecal impaction. Patient was giving senna, refused lactulose, having multiple loose stools, but repeated KUB still showed diffuse dilation including colon and small bowel. 1/27. Patient had multiple loose stools again overnight, repeated KUB still showed significant dilation of colon and small  bowel.  I also started Reglan for nausea and vomiting, nausea seems to be better, patient is tolerating liquid diet.  However, diarrhea continued. Due to dilation of the small bowel and colon, including rectum, patient unlikely has small bowel obstruction.  Most likely is ileus.  Condition most likely caused by gastroenteritis in the setting of multiple sclerosis, which may impair GI nervous system. 1/28. Discussed with Dr. Servando Snare, he does not have any treatment suggestions to offer.  For now, since patient has some improvement, I will continue Reglan, continue senna, continue IV fluids.  May consider transfer to tertiary hospital if condition does not improve in 48 hours.   Acute kidney injury Metabolic acidosis. Hypokalemia. Hypophosphatemia. Replete potassium and phosphate.   Hypoglycemia secondary to NPO and adrenal insufficiency. Addison disease with adrenal crisis. Fluids changed to D5 lactated Ringer with 20 mEq of KCl. Continue hydrocortisone IV.   Multiple sclerosis. Follow-up with PCP as outpatient.  Currently patient does not have any worsening neurologic symptoms.       Subjective:  Patient still has nausea, but no vomiting.  Able to tolerating liquid diet.  Still has multiple loose stools a day, to the point of becoming constant.  Physical Exam: Vitals:   03/06/23 1655 03/06/23 2005 03/07/23 0323 03/07/23 0930  BP: (!) 168/72 (!) 167/72 (!) 147/68 (!) 163/73  Pulse: 68 70 72 73  Resp:  18 17 18   Temp: 98 F (36.7 C) 98 F (36.7 C) 98 F (36.7 C) 97.8 F (36.6 C)  TempSrc: Oral  Oral Oral  SpO2: 100% 100% 100% 99%  Weight:      Height:       General exam: Appears calm and comfortable  Respiratory system: Clear to auscultation. Respiratory effort normal. Cardiovascular system: S1 & S2 heard, RRR. No JVD, murmurs, rubs, gallops or clicks. No pedal edema. Gastrointestinal system: Abdomen is nondistended, soft and nontender. No organomegaly or masses felt. Normal  bowel sounds heard. Central nervous system: Alert and oriented. No focal neurological deficits. Extremities: Symmetric 5 x 5 power. Skin: No rashes, lesions or ulcers Psychiatry: Judgement and insight appear normal. Mood & affect appropriate.    Data Reviewed:  Lab results reviewed.  Family Communication: Husband updated at bedside.  Disposition: Status is: Inpatient Remains inpatient appropriate because: Severity of disease, IV treatment.     Time spent: 50 minutes  Author: Marrion Coy, MD 03/07/2023 2:05 PM  For on call review [email or web address].

## 2023-03-07 NOTE — Progress Notes (Signed)
[facility] SURGICAL PROGRESS NOTE (cpt [phone])  Hospital Day(s): 3.   Interval History: Patient seen and examined. No emesis overnight. Still with distension. No abdominal pain. Still with diarrhea. No fever, chills. Renal function normal; sCr - 0.86; UO - unmeasured. Hypokalemia to 3.2. Hypophosphatemia to 1.7. KUB this morning continues to have significant dilation of bowel. She is on FLD; tolerating. She continues to have numerous bowel movements.   Review of Systems:  Constitutional: denies fever, chills  HEENT: denies cough or congestion  Respiratory: denies any shortness of breath  Cardiovascular: denies chest pain or palpitations  Gastrointestinal: denied abdominal pain, denied nausea/emesis, + diarrhea Genitourinary: denies burning with urination or urinary frequency Musculoskeletal: denies pain, decreased motor or sensation  Vital signs in last 24 hours: [min-max] current  Temp:  [98 F (36.7 C)-98.9 F (37.2 C)] 98 F (36.7 C) (01/28 0323) Pulse Rate:  [68-72] 72 (01/28 0323) Resp:  [17-18] 17 (01/28 0323) BP: (147-168)/(62-72) 147/68 (01/28 0323) SpO2:  [99 %-100 %] 100 % (01/28 0323)     Height: 5' (152.4 cm) Weight: 44.9 kg BMI (Calculated): 19.33   Intake/Output last 2 shifts:  01/27 0701 - 01/28 0700 In: 1233.8 [P.O.:100; I.V.:615.8; IV Piggyback:518] Out: -    Physical Exam:  Constitutional: alert, cooperative and no distress  HENT: normocephalic without obvious abnormality  Eyes: PERRL, EOM's grossly intact and symmetric  Respiratory: breathing non-labored at rest  Cardiovascular: regular rate and sinus rhythm  Gastrointestinal: soft, non-tender, she is distended, no rebound/guarding. She is not peritonitic Musculoskeletal: no edema or wounds, motor and sensation grossly intact, NT    Labs:     Latest Ref Rng & Units 03/06/2023    5:58 AM 03/05/2023    5:25 AM 03/03/2023    8:52 PM  CBC  WBC 4.0 - 10.5 K/uL 8.2  5.5  7.4   Hemoglobin  12.0 - 15.0 g/dL 9.0  8.9  29.5   Hematocrit 36.0 - 46.0 % 27.3  27.8  36.7   Platelets 150 - 400 K/uL 185  184  228       Latest Ref Rng & Units 03/07/2023    5:29 AM 03/06/2023    5:58 AM 03/05/2023    5:25 AM  CMP  Glucose 70 - 99 mg/dL 621  308  69   BUN 8 - 23 mg/dL 18  27  40   Creatinine 0.44 - 1.00 mg/dL 6.57  8.46  9.62   Sodium 135 - 145 mmol/L 142  142  142   Potassium 3.5 - 5.1 mmol/L 3.2  3.1  3.1   Chloride 98 - 111 mmol/L 114  109  117   CO2 22 - 32 mmol/L 22  24  14    Calcium 8.9 - 10.3 mg/dL 9.0  9.0  8.7     Imaging studies:   KUB (03/07/2023) personally reviewed with continued bowel distension, no pneumatosis, no free air, and radiologist report pending...    Assessment/Plan: (ICD-10's: K21.609) 78 y.o. female with question of SBO on admission although continues to have bowel function, now with what appears to be worsening bowel dilation on KUB this AM which may be sequela of MS vs severe electrolyte derangements vs constipation   - Would not advance beyond FLD. If nausea/emesis return, would make NPO.   - She needs her electrolyte derangements corrected   - If nausea/emesis recur, she will likely benefit from NGT decompression  - No need for emergent surgical intervention. - Serial KUB  as needed; can consider gastrografin challenge although no behaving clinically like obstruction - Monitor abdomina examination; on-going bowel function - Pain control prn; limit/avoid narcotics - Antiemetics prn - Mobilize as feasible  - Further management per primary service; we will follow   All of the above findings and recommendations were discussed with the patient, and the medical team, and all of patient's questions were answered to her expressed satisfaction.  -- Lynden Oxford, PA-C [facility] 03/07/2023, 7:20 AM M-F: 7am - 4pm

## 2023-03-07 NOTE — Evaluation (Signed)
Physical Therapy Evaluation Patient Details Name: Katrina Rivera MRN: [id number] DOB: 19-Dec-1945 Today's Date: 03/07/2023  History of Present Illness  Katrina Rivera is a 78 y/o female presenting on 03/02/23 with abdominal pain as well as nausea and vomiting and diarrhea. PMH: addison's disease, anemia, anxiety, arthritis, chronic back pain, depression, fibromyalgia, hypotension, IBS, MS, peripheral neuropathy, syncope, spinal cord stimulator, prior back surgery.  Clinical Impression  Pt in bed agreeable to session, husband at bedside. Pt reports 8/10 pain,recently got pain meds, wants to use their power to achieve a nap, however author encourages her to take this opportunity to achieve some mobility- pt did not mobilize much yesterday despite AMB in hall previous day with staff. Pt reports to feel much better the longer she walks, elects to add a 2nd then a 3rd lap around the unit. Safe use of RW noted, pacing consistent, no LOB noted- Chartered loss adjuster manages IV pole. Will continue to follow.       If plan is discharge home, recommend the following: A little help with walking and/or transfers;Help with stairs or ramp for entrance;Assist for transportation;Assistance with cooking/housework   Can travel by private vehicle        Equipment Recommendations None recommended by PT  Recommendations for Other Services       Functional Status Assessment Patient has had a recent decline in their functional status and demonstrates the ability to make significant improvements in function in a reasonable and predictable amount of time.     Precautions / Restrictions Precautions Precautions: Fall Restrictions Weight Bearing Restrictions Per Provider Order: No      Mobility  Bed Mobility Overal bed mobility: Modified Independent Bed Mobility: Supine to Sit, Sit to Supine     Supine to sit: Supervision, Used rails, HOB elevated Sit to supine: Supervision, Used rails, HOB elevated         Transfers Overall transfer level: Needs assistance Equipment used: Rolling walker (2 wheels) Transfers: Sit to/from Stand Sit to Stand: Supervision                Ambulation/Gait Ambulation/Gait assistance: Supervision, Contact guard assist Gait Distance (Feet): 560 Feet Assistive device: Rolling walker (2 wheels) Gait Pattern/deviations: WFL(Within Functional Limits) Gait velocity: 0.21m/s     General Gait Details: speeds up after first lap, reports to feel pretty good the longer she walks  Stairs            Wheelchair Mobility     Tilt Bed    Modified Rankin (Stroke Patients Only)       Balance                                             Pertinent Vitals/Pain Pain Assessment Pain Assessment: 0-10 Pain Score: 8  Pain Location: chronic back pain Pain Intervention(s): Limited activity within patient's tolerance, Monitored during session, Premedicated before session    Home Living Family/patient expects to be discharged to:: Private residence Living Arrangements: Spouse/significant other Available Help at Discharge: Family Type of Home: House Home Access: Stairs to enter Entrance Stairs-Rails: None Entrance Stairs-Number of Steps: 1   Home Layout: One level Home Equipment: Agricultural consultant (2 wheels);Rollator (4 wheels);Shower seat;Grab bars - toilet;BSC/3in1;Wheelchair - manual;Cane - single point;Other (comment) Additional Comments: adjustable bed    Prior Function Prior Level of Function : Needs assist;History of Falls (last six months)  Mobility Comments: ambulating wiht RW, assist for 1 step into house and spouse provides supervision for tub transfers ADLs Comments: Pt reports spouse assists with dressing and tub transfers. Pt cooks and manages her own medication. Spouse drives.     Extremity/Trunk Assessment                Communication      Cognition Arousal: Alert Behavior During Therapy: WFL  for tasks assessed/performed Overall Cognitive Status: Within Functional Limits for tasks assessed                                          General Comments      Exercises     Assessment/Plan    PT Assessment Patient needs continued PT services  PT Problem List Decreased strength;Decreased activity tolerance;Decreased balance;Decreased mobility       PT Treatment Interventions DME instruction;Gait training;Patient/family education;Functional mobility training;Stair training;Therapeutic activities;Therapeutic exercise;Balance training;Neuromuscular re-education    PT Goals (Current goals can be found in the Care Plan section)  Acute Rehab PT Goals Patient Stated Goal: regain stength and baseline mobility PT Goal Formulation: With patient Time For Goal Achievement: 03/21/23 Potential to Achieve Goals: Good    Frequency Min 1X/week     Co-evaluation               AM-PAC PT "6 Clicks" Mobility  Outcome Measure Help needed turning from your back to your side while in a flat bed without using bedrails?: None Help needed moving from lying on your back to sitting on the side of a flat bed without using bedrails?: None Help needed moving to and from a bed to a chair (including a wheelchair)?: None Help needed standing up from a chair using your arms (e.g., wheelchair or bedside chair)?: None Help needed to walk in hospital room?: A Little Help needed climbing 3-5 steps with a railing? : A Lot 6 Click Score: 21    End of Session Equipment Utilized During Treatment: Gait belt Activity Tolerance: Patient tolerated treatment well;No increased pain Patient left: in bed;with call bell/phone within reach;with bed alarm set;with family/visitor present Nurse Communication: Mobility status PT Visit Diagnosis: Unsteadiness on feet (R26.81);Other abnormalities of gait and mobility (R26.89);Difficulty in walking, not elsewhere classified (R26.2);Muscle weakness  (generalized) (M62.81)    Time: 2440-1027 PT Time Calculation (min) (ACUTE ONLY): 32 min   Charges:   PT Evaluation $PT Eval Moderate Complexity: 1 Mod PT Treatments $Therapeutic Activity: 8-22 mins PT General Charges $$ ACUTE PT VISIT: 1 Visit        4:42 PM, 03/07/23 Katrina Rivera, PT, DPT Physical Therapist - [facility]  [phone] (ASCOM)   Katrina Rivera C 03/07/2023, 4:40 PM

## 2023-03-08 DIAGNOSIS — K6389 Other specified diseases of intestine: Secondary | ICD-10-CM | POA: Diagnosis not present

## 2023-03-08 DIAGNOSIS — K567 Ileus, unspecified: Secondary | ICD-10-CM | POA: Diagnosis not present

## 2023-03-08 DIAGNOSIS — E878 Other disorders of electrolyte and fluid balance, not elsewhere classified: Secondary | ICD-10-CM | POA: Diagnosis not present

## 2023-03-08 LAB — GLUCOSE, CAPILLARY
Glucose-Capillary: 110 mg/dL — ABNORMAL HIGH (ref 70–99)
Glucose-Capillary: 141 mg/dL — ABNORMAL HIGH (ref 70–99)
Glucose-Capillary: 70 mg/dL (ref 70–99)
Glucose-Capillary: 96 mg/dL (ref 70–99)

## 2023-03-08 LAB — BASIC METABOLIC PANEL
Anion gap: 7 (ref 5–15)
BUN: 14 mg/dL (ref 8–23)
CO2: 19 mmol/L — ABNORMAL LOW (ref 22–32)
Calcium: 8.8 mg/dL — ABNORMAL LOW (ref 8.9–10.3)
Chloride: 113 mmol/L — ABNORMAL HIGH (ref 98–111)
Creatinine, Ser: 0.63 mg/dL (ref 0.44–1.00)
GFR, Estimated: >60 mL/min (ref >60)
Glucose, Bld: 100 mg/dL — ABNORMAL HIGH (ref 70–99)
Potassium: 3.9 mmol/L (ref 3.5–5.1)
Sodium: 139 mmol/L (ref 135–145)

## 2023-03-08 LAB — CBC
HCT: 29.8 % — ABNORMAL LOW (ref 36.0–46.0)
Hemoglobin: 9.9 g/dL — ABNORMAL LOW (ref 12.0–15.0)
MCH: 31.6 pg (ref 26.0–34.0)
MCHC: 33.2 g/dL (ref 30.0–36.0)
MCV: 95.2 fL (ref 80.0–100.0)
Platelets: 182 10*3/uL (ref 150–400)
RBC: 3.13 MIL/uL — ABNORMAL LOW (ref 3.87–5.11)
RDW: 13.9 % (ref 11.5–15.5)
WBC: 8.5 10*3/uL (ref 4.0–10.5)
nRBC: 0 % (ref 0.0–0.2)

## 2023-03-08 LAB — PHOSPHORUS: Phosphorus: 1.4 mg/dL — ABNORMAL LOW (ref 2.5–4.6)

## 2023-03-08 LAB — MAGNESIUM: Magnesium: 2 mg/dL (ref 1.7–2.4)

## 2023-03-08 LAB — VITAMIN D 25 HYDROXY (VIT D DEFICIENCY, FRACTURES): Vit D, 25-Hydroxy: 75.76 ng/mL (ref 30–100)

## 2023-03-08 MED ORDER — POTASSIUM PHOSPHATES 15 MMOLE/5ML IV SOLN
30.0000 mmol | Freq: Once | INTRAVENOUS | Status: AC
  Administered 2023-03-08: 30 mmol via INTRAVENOUS
  Filled 2023-03-08: qty 10

## 2023-03-08 NOTE — Progress Notes (Signed)
Midge Minium, MD [facility]   [address] [place], [place] [address] Phone: [phone] Fax : [phone]   Subjective: This patient reports that her abdomen feels a little less distended than yesterday.  She states that she had some vomiting last night and was given antiemetics.  The patient continues to have some electrolyte abnormalities.   Objective: Vital signs in last 24 hours: Vitals:   03/07/23 1648 03/07/23 1925 03/08/23 0510 03/08/23 0806  BP: (!) 176/84 (!) 188/84 (!) 181/80 (!) 159/68  Pulse: 79 85 79 88  Resp: 17 17 20 16   Temp: 98.3 F (36.8 C) 97.6 F (36.4 C) 97.8 F (36.6 C) 98.4 F (36.9 C)  TempSrc: Oral Oral Oral Oral  SpO2: 96% 100% 100% 96%  Weight:      Height:       Weight change:   Intake/Output Summary (Last 24 hours) at 03/08/2023 6962 Last data filed at 03/07/2023 1800 Gross per 24 hour  Intake 1047.33 ml  Output --  Net 1047.33 ml     Exam: Heart:: Regular rate and rhythm or without murmur or extra heart sounds Lungs: normal and clear to auscultation and percussion Abdomen: Distended with less distention than yesterday.  No abdominal pain   Lab Results: @LABTEST2 @ Micro Results: Recent Results (from the past 240 hours)  Gastrointestinal Panel by PCR , Stool     Status: None   Collection Time: 03/04/23  8:09 AM   Specimen: Stool  Result Value Ref Range Status   Campylobacter species NOT DETECTED NOT DETECTED Final   Plesimonas shigelloides NOT DETECTED NOT DETECTED Final   Salmonella species NOT DETECTED NOT DETECTED Final   Yersinia enterocolitica NOT DETECTED NOT DETECTED Final   Vibrio species NOT DETECTED NOT DETECTED Final   Vibrio cholerae NOT DETECTED NOT DETECTED Final   Enteroaggregative E coli (EAEC) NOT DETECTED NOT DETECTED Final   Enteropathogenic E coli (EPEC) NOT DETECTED NOT DETECTED Final   Enterotoxigenic E coli (ETEC) NOT DETECTED NOT DETECTED Final   Shiga like toxin producing E coli (STEC) NOT  DETECTED NOT DETECTED Final   Shigella/Enteroinvasive E coli (EIEC) NOT DETECTED NOT DETECTED Final   Cryptosporidium NOT DETECTED NOT DETECTED Final   Cyclospora cayetanensis NOT DETECTED NOT DETECTED Final   Entamoeba histolytica NOT DETECTED NOT DETECTED Final   Giardia lamblia NOT DETECTED NOT DETECTED Final   Adenovirus F40/41 NOT DETECTED NOT DETECTED Final   Astrovirus NOT DETECTED NOT DETECTED Final   Norovirus GI/GII NOT DETECTED NOT DETECTED Final   Rotavirus A NOT DETECTED NOT DETECTED Final   Sapovirus (I, II, IV, and V) NOT DETECTED NOT DETECTED Final    Comment: Performed at [facility], [address] [place], [place] [address]  C Difficile Quick Screen w PCR reflex     Status: None   Collection Time: 03/04/23  8:09 AM   Specimen: Stool  Result Value Ref Range Status   C Diff antigen NEGATIVE NEGATIVE Final   C Diff toxin NEGATIVE NEGATIVE Final   C Diff interpretation No C. difficile detected.  Final    Comment: Performed at [facility], [address] [place], [place] [address]   Studies/Results: DG Abd 1 View Result Date: 03/07/2023 CLINICAL DATA:  Ileus EXAM: ABDOMEN - 1 VIEW COMPARISON:  03/06/2023 FINDINGS: Persisting gaseous distension of both large and small bowel loops throughout the abdomen. Small bowel dilation of a loop within the left hemiabdomen measuring up to 4.7 cm in diameter,  similar to prior. No gross free intraperitoneal air on supine imaging. IMPRESSION: Persisting gaseous distension of both large and small bowel loops throughout the abdomen, similar to prior. Electronically Signed   By: Duanne Guess D.O.   On: 03/07/2023 09:09   Medications: I have reviewed the patient's current medications. Scheduled Meds:  amLODipine  2.5 mg Oral Daily   buPROPion  300 mg Oral q morning   gabapentin  300 mg Oral Q0600   heparin  5,000 Units Subcutaneous Q12H   hydrocortisone sod succinate (SOLU-CORTEF) inj  100 mg Intravenous Q12H    lactulose  20 g Oral Once   metoCLOPramide (REGLAN) injection  5 mg Intravenous Q8H   pantoprazole (PROTONIX) IV  40 mg Intravenous Q24H   QUEtiapine  25 mg Oral QHS   senna-docusate  2 tablet Oral BID   tiZANidine  4 mg Oral QHS   topiramate  50 mg Oral QHS   Continuous Infusions:  potassium PHOSPHATE IVPB (in mmol) 30 mmol (03/08/23 0904)   PRN Meds:.albuterol, clonazePAM, fluticasone, HYDROmorphone (DILAUDID) injection, ondansetron (ZOFRAN) IV, oxyCODONE-acetaminophen   Assessment: Principal Problem:   SBO (small bowel obstruction) (HCC) Active Problems:   MS (multiple sclerosis) (HCC)   Chronic adrenal insufficiency (HCC)   Hypokalemia   Nausea vomiting and diarrhea   Metabolic acidosis   AKI (acute kidney injury) (HCC)   Fecal impaction (HCC)   Ileus (HCC)    Plan: The patient had refused a rectal tube that was ordered yesterday because she felt she was passing gas.  The patient has been told that if she decides to put the rectal tube I can let the nurses know.  As far as her gaseous distention of both the large and small bowel on the x-ray yesterday this is consistent with ileus and not amenable to endoscopic intervention to decrease the small bowel distention.  I would recommend correcting electrolytes.  It the patient's symptoms continue after the patient's electrolytes have been corrected then please do not hesitate to reach out to [place] again.  I will sign off.  Please call if any further GI concerns or questions.  We would like to thank you for the opportunity to participate in the care of VIRGA HALTIWANGER.    LOS: 4 days   Midge Minium, MD.FACG 03/08/2023, 9:39 AM Pager [phone] 7am-5pm  Check AMION for 5pm -7am coverage and on weekends

## 2023-03-08 NOTE — Progress Notes (Addendum)
Progress Note    [name]  Rivera:[id number] DOB: Oct 02, 1945  DOA: 03/04/2023 PCP: Noberto Retort, MD      Brief Narrative:    Medical records reviewed and are as summarized below:  Katrina Rivera is a 78 y.o. female  with medical history significant of Addison's disease on chronic Cortef, colon cancer status post partial colectomy, chronic back pain on narcotics, multiple sclerosis peripheral neuropathy, HTN, anxiety/depression, who presented to the hospital with abdominal pain, abdominal distention and diarrhea.  CT scan abdomen/pelvis showed large stool in rectum, consistent with fecal impaction.  Patient was given fluids and symptomatic treatment. Patient was given lactulose, she had a multiple loose stools, repeat KUB still has dilation in the colon as well as in the small bowel, consistent with ileus.      Assessment/Plan:   Active Problems:   MS (multiple sclerosis) (HCC)   Chronic adrenal insufficiency (HCC)   Hypokalemia   Nausea vomiting and diarrhea   Metabolic acidosis   AKI (acute kidney injury) (HCC)   Fecal impaction (HCC)   Ileus (HCC)    Body mass index is 19.33 kg/m.   Nausea vomiting secondary to fecal impaction. Ileus. Patient has a chronic constipation alternating with diarrhea.  She came to the hospital with nausea vomiting and stool in the rectum, after arriving the hospital, she had multiple large bowel movements. Patient CT scan results was consistent with fecal impaction. Patient was giving senna, refused lactulose, having multiple loose stools, but repeated KUB still showed diffuse dilation including colon and small bowel. Differential diagnosis include ileus, gastroparesis, MS related dysmotility Patient has decided to try rectal tube.    Acute kidney injury Metabolic acidosis. Hypokalemia. Hypophosphatemia Phosphorus is still low 1.4. replete phosphorus with IV potassium phosphate. Creatinine has improved. Anion gap  metabolic acidosis likely from diarrhea.    Hypoglycemia secondary to NPO and adrenal insufficiency. Addison disease with adrenal crisis. Glucose levels is stable.  Discontinue IV fluids. Continue IV hydrocortisone    Multiple sclerosis. No acute issues Follow-up with PCP as outpatient.      Comorbidities include hypertension, fibromyalgia, arthritis, depression, anxiety, chronic back pain, IBS, peripheral neuropathy, restless leg syndrome      Diet Order             Diet full liquid Room service appropriate? Yes; Fluid consistency: Thin  Diet effective now                            Consultants: General surgeon Gastroenterologist  Procedures: None    Medications:    amLODipine  2.5 mg Oral Daily   buPROPion  300 mg Oral q morning   gabapentin  300 mg Oral Q0600   heparin  5,000 Units Subcutaneous Q12H   hydrocortisone sod succinate (SOLU-CORTEF) inj  100 mg Intravenous Q12H   lactulose  20 g Oral Once   metoCLOPramide (REGLAN) injection  5 mg Intravenous Q8H   pantoprazole (PROTONIX) IV  40 mg Intravenous Q24H   QUEtiapine  25 mg Oral QHS   senna-docusate  2 tablet Oral BID   tiZANidine  4 mg Oral QHS   topiramate  50 mg Oral QHS   Continuous Infusions:  potassium PHOSPHATE IVPB (in mmol) 30 mmol (03/08/23 0904)     Anti-infectives (From admission, onward)    None              Family Communication/Anticipated D/C date and plan/Code  Status   DVT prophylaxis: heparin injection 5,000 Units Start: 03/04/23 1000     Code Status: Full Code  Family Communication: None Disposition Plan: Discharge home   Status is: Inpatient Remains inpatient appropriate because: Ileus       Subjective:   Interval events noted.  She said she vomited last night and was given several antiemetics.  She feels a little better today but she still has some abdominal pain.  She is moving her bowels.  Objective:    Vitals:   03/07/23 1648  03/07/23 1925 03/08/23 0510 03/08/23 0806  BP: (!) 176/84 (!) 188/84 (!) 181/80 (!) 159/68  Pulse: 79 85 79 88  Resp: 17 17 20 16   Temp: 98.3 F (36.8 C) 97.6 F (36.4 C) 97.8 F (36.6 C) 98.4 F (36.9 C)  TempSrc: Oral Oral Oral Oral  SpO2: 96% 100% 100% 96%  Weight:      Height:       No data found.   Intake/Output Summary (Last 24 hours) at 03/08/2023 1502 Last data filed at 03/07/2023 1800 Gross per 24 hour  Intake 510 ml  Output --  Net 510 ml   Filed Weights   03/03/23 2047  Weight: 44.9 kg    Exam:   GEN: NAD SKIN: Warm and dry EYES: No pallor or icterus ENT: MMM CV: RRR PULM: CTA B ABD: soft, ND, NT, +BS CNS: AAO x 3, non focal EXT: Right leg edema, no tenderness       Data Reviewed:   I have personally reviewed following labs and imaging studies:  Labs: Labs show the following:   Basic Metabolic Panel: Recent Labs  Lab 03/03/23 2052 03/05/23 0525 03/06/23 0558 03/07/23 0529 03/08/23 0551  NA 136 142 142 142 139  K 3.7 3.1* 3.1* 3.2* 3.9  CL 101 117* 109 114* 113*  CO2 21* 14* 24 22 19*  GLUCOSE 150* 69* 144* 124* 100*  BUN 28* 40* 27* 18 14  CREATININE 1.75* 1.29* 0.89 0.86 0.63  CALCIUM 11.4* 8.7* 9.0 9.0 8.8*  MG 2.0  --  1.8 1.7 2.0  PHOS  --   --  <1.0* 1.7* 1.4*   GFR Estimated Creatinine Clearance: 43.1 mL/min (by C-G formula based on SCr of 0.63 mg/dL). Liver Function Tests: Recent Labs  Lab 03/03/23 2052  AST 40  ALT 35  ALKPHOS 47  BILITOT 0.8  PROT 7.3  ALBUMIN 4.1   Recent Labs  Lab 03/03/23 2052  LIPASE 30   No results for input(s): "AMMONIA" in the last 168 hours. Coagulation profile No results for input(s): "INR", "PROTIME" in the last 168 hours.  CBC: Recent Labs  Lab 03/03/23 2052 03/05/23 0525 03/06/23 0558 03/08/23 0551  WBC 7.4 5.5 8.2 8.5  HGB 11.7* 8.9* 9.0* 9.9*  HCT 36.7 27.8* 27.3* 29.8*  MCV 97.6 98.9 93.2 95.2  PLT 228 184 185 182   Cardiac Enzymes: No results for input(s):  "CKTOTAL", "CKMB", "CKMBINDEX", "TROPONINI" in the last 168 hours. BNP (last 3 results) No results for input(s): "PROBNP" in the last 8760 hours. CBG: Recent Labs  Lab 03/07/23 1149 03/07/23 1646 03/07/23 2142 03/08/23 0808 03/08/23 1143  GLUCAP 138* 108* 119* 110* 141*   D-Dimer: No results for input(s): "DDIMER" in the last 72 hours. Hgb A1c: No results for input(s): "HGBA1C" in the last 72 hours. Lipid Profile: No results for input(s): "CHOL", "HDL", "LDLCALC", "TRIG", "CHOLHDL", "LDLDIRECT" in the last 72 hours. Thyroid function studies: No results for input(s): "TSH", "  T4TOTAL", "T3FREE", "THYROIDAB" in the last 72 hours.  Invalid input(s): "FREET3" Anemia work up: Recent Labs    03/07/23 0529 03/07/23 0831  VITAMINB12  --  2,125*  FERRITIN 124  --   TIBC 266  --   IRON 47  --    Sepsis Labs: Recent Labs  Lab 03/03/23 2052 03/05/23 0525 03/06/23 0558 03/08/23 0551  WBC 7.4 5.5 8.2 8.5    Microbiology Recent Results (from the past 240 hours)  Gastrointestinal Panel by PCR , Stool     Status: None   Collection Time: 03/04/23  8:09 AM   Specimen: Stool  Result Value Ref Range Status   Campylobacter species NOT DETECTED NOT DETECTED Final   Plesimonas shigelloides NOT DETECTED NOT DETECTED Final   Salmonella species NOT DETECTED NOT DETECTED Final   Yersinia enterocolitica NOT DETECTED NOT DETECTED Final   Vibrio species NOT DETECTED NOT DETECTED Final   Vibrio cholerae NOT DETECTED NOT DETECTED Final   Enteroaggregative E coli (EAEC) NOT DETECTED NOT DETECTED Final   Enteropathogenic E coli (EPEC) NOT DETECTED NOT DETECTED Final   Enterotoxigenic E coli (ETEC) NOT DETECTED NOT DETECTED Final   Shiga like toxin producing E coli (STEC) NOT DETECTED NOT DETECTED Final   Shigella/Enteroinvasive E coli (EIEC) NOT DETECTED NOT DETECTED Final   Cryptosporidium NOT DETECTED NOT DETECTED Final   Cyclospora cayetanensis NOT DETECTED NOT DETECTED Final   Entamoeba  histolytica NOT DETECTED NOT DETECTED Final   Giardia lamblia NOT DETECTED NOT DETECTED Final   Adenovirus F40/41 NOT DETECTED NOT DETECTED Final   Astrovirus NOT DETECTED NOT DETECTED Final   Norovirus GI/GII NOT DETECTED NOT DETECTED Final   Rotavirus A NOT DETECTED NOT DETECTED Final   Sapovirus (I, II, IV, and V) NOT DETECTED NOT DETECTED Final    Comment: Performed at [facility], [address] [place], [place] [address]  C Difficile Quick Screen w PCR reflex     Status: None   Collection Time: 03/04/23  8:09 AM   Specimen: Stool  Result Value Ref Range Status   C Diff antigen NEGATIVE NEGATIVE Final   C Diff toxin NEGATIVE NEGATIVE Final   C Diff interpretation No C. difficile detected.  Final    Comment: Performed at [facility], [address] [place], [place] [address]    Procedures and diagnostic studies:  DG Abd 1 View Result Date: 03/07/2023 CLINICAL DATA:  Ileus EXAM: ABDOMEN - 1 VIEW COMPARISON:  03/06/2023 FINDINGS: Persisting gaseous distension of both large and small bowel loops throughout the abdomen. Small bowel dilation of a loop within the left hemiabdomen measuring up to 4.7 cm in diameter, similar to prior. No gross free intraperitoneal air on supine imaging. IMPRESSION: Persisting gaseous distension of both large and small bowel loops throughout the abdomen, similar to prior. Electronically Signed   By: Duanne Guess D.O.   On: 03/07/2023 09:09               LOS: 4 days   Adrianna Dudas  [facility]   Pager on [email or web address]. If 7PM-7AM, please contact night-coverage at [email or web address]     03/08/2023, 3:02 PM

## 2023-03-08 NOTE — Evaluation (Signed)
Physical Therapy Evaluation Patient Details Name: Katrina Rivera MRN: [id number] DOB: 1945/02/13 Today's Date: 03/08/2023 *Late entry for services rendered 1/126*  History of Present Illness  Katrina Rivera is a 78 y/o female presenting on 03/02/23 with abdominal pain as well as nausea and vomiting and diarrhea. PMH: addison's disease, anemia, anxiety, arthritis, chronic back pain, depression, fibromyalgia, hypotension, IBS, MS, peripheral neuropathy, syncope, spinal cord stimulator, prior back surgery.  Clinical Impression  Pt pleasant and eager to work with PT, husband present and engaged t/o session.  Pt ultimately showed ability to move quite well and was able to do most tasks w/o needing direct assist, consistent cuing to stay on task and general directional cuing.  Pt needed some assist with clean up of BM already present on arrival, but after clean up she was able to get to the bathroom and manage self clean up (peri and hand washing) w/o direct assist from the actual commode.  Pt eager to try walking outside of the room and actually did very well using the walker and circumambulating the nurses' station while being able to hold conversation and maintaining stable vital signs.  Pt with good overall effort, will benefit from continued PT to address functional limitations.        If plan is discharge home, recommend the following: A little help with walking and/or transfers;Help with stairs or ramp for entrance;Assist for transportation;Assistance with cooking/housework   Can travel by private vehicle        Equipment Recommendations None recommended by PT  Recommendations for Other Services       Functional Status Assessment Patient has had a recent decline in their functional status and demonstrates the ability to make significant improvements in function in a reasonable and predictable amount of time.     Precautions / Restrictions Precautions Precautions:  Fall Restrictions Weight Bearing Restrictions Per Provider Order: No      Mobility  Bed Mobility Overal bed mobility: Modified Independent Bed Mobility: Supine to Sit, Sit to Supine           General bed mobility comments: able to transition with relative ease light use of bed rails    Transfers Overall transfer level: Needs assistance Equipment used: Rolling walker (2 wheels) Transfers: Sit to/from Stand Sit to Stand: Supervision           General transfer comment: Pt able to stand multiple times from multiple surfaces with only supervision.  Light cuing for UE placement/general safety but overall confident and w/o need of phyiscal assist    Ambulation/Gait Ambulation/Gait assistance: Supervision, Contact guard assist Gait Distance (Feet): 200 Feet Assistive device: Rolling walker (2 wheels)         General Gait Details: Pt initially unsure she would be able to do prolonged bout of ambulation but ultimately managed the entire loop with safe and consistent cadence.  Pt with minimal c/o fatigue, good overall effort.  Stairs            Wheelchair Mobility     Tilt Bed    Modified Rankin (Stroke Patients Only)       Balance Overall balance assessment: Modified Independent                                           Pertinent Vitals/Pain Pain Assessment Pain Assessment: Faces Faces Pain Scale: Hurts little more Pain Location: back sore  from being in bed, chronic    Home Living Family/patient expects to be discharged to:: Private residence Living Arrangements: Spouse/significant other Available Help at Discharge: Family Type of Home: House Home Access: Stairs to enter Entrance Stairs-Rails: None Entrance Stairs-Number of Steps: 1   Home Layout: One level Home Equipment: Agricultural consultant (2 wheels);Rollator (4 wheels);Shower seat;Grab bars - toilet;BSC/3in1;Wheelchair - manual;Cane - single point;Other (comment) Additional  Comments: adjustable bed    Prior Function Prior Level of Function : Needs assist;History of Falls (last six months)             Mobility Comments: uses RW, assist for 1 step into house and spouse provides transport ADLs Comments: Pt reports spouse assists with dressing and tub transfers. Pt cooks and manages her own medication. Spouse drives.     Extremity/Trunk Assessment   Upper Extremity Assessment Upper Extremity Assessment: Generalized weakness;Overall [facility] for tasks assessed    Lower Extremity Assessment Lower Extremity Assessment: Generalized weakness;Overall WFL for tasks assessed       Communication   Communication Communication: No apparent difficulties  Cognition Arousal: Alert Behavior During Therapy: WFL for tasks assessed/performed Overall Cognitive Status: Within Functional Limits for tasks assessed                                          General Comments General comments (skin integrity, edema, etc.): On arrival pt in need of assist to clean BM, during session needed to use bathroom again.  Able to provide self-pericare with set up from commode.    Exercises     Assessment/Plan    PT Assessment Patient needs continued PT services  PT Problem List Decreased strength;Decreased activity tolerance;Decreased balance;Decreased mobility       PT Treatment Interventions DME instruction;Gait training;Patient/family education;Functional mobility training;Stair training;Therapeutic activities;Therapeutic exercise;Balance training;Neuromuscular re-education    PT Goals (Current goals can be found in the Care Plan section)  Acute Rehab PT Goals Patient Stated Goal: regain stength and baseline mobility PT Goal Formulation: With patient Time For Goal Achievement: 03/21/23 Potential to Achieve Goals: Good    Frequency Min 1X/week     Co-evaluation               AM-PAC PT "6 Clicks" Mobility  Outcome Measure Help needed turning  from your back to your side while in a flat bed without using bedrails?: None Help needed moving from lying on your back to sitting on the side of a flat bed without using bedrails?: None Help needed moving to and from a bed to a chair (including a wheelchair)?: None Help needed standing up from a chair using your arms (e.g., wheelchair or bedside chair)?: None Help needed to walk in hospital room?: A Little Help needed climbing 3-5 steps with a railing? : A Lot 6 Click Score: 21    End of Session Equipment Utilized During Treatment: Gait belt Activity Tolerance: Patient tolerated treatment well;No increased pain Patient left: with family/visitor present;with chair alarm set;with call bell/phone within reach Nurse Communication: Mobility status PT Visit Diagnosis: Unsteadiness on feet (R26.81);Other abnormalities of gait and mobility (R26.89);Difficulty in walking, not elsewhere classified (R26.2);Muscle weakness (generalized) (M62.81)    Time: 7829-5621 PT Time Calculation (min) (ACUTE ONLY): 45 min   Charges:   PT Evaluation $PT Eval Low Complexity: 1 Low PT Treatments $Gait Training: 8-22 mins $Therapeutic Activity: 8-22 mins PT General Charges $$ ACUTE  PT VISIT: 1 Visit         Malachi Pro, DPT 03/08/2023, 7:55 AM

## 2023-03-08 NOTE — Plan of Care (Signed)

## 2023-03-08 NOTE — Progress Notes (Signed)
[facility] SURGICAL PROGRESS NOTE (cpt [phone])  Hospital Day(s): 4.   Interval History: Patient seen and examined. Still with nausea and abdominal distension. She remains without leukocytosis; WBC 8.5K. Hgb to 9.9; stable. Renal function normal; sCr - 0.63; UO - unmeasured. Hypophosphatemia to 1.4. No new imaging this AM. She is on FLD; tolerating. She continues to have numerous bowel movements.   Review of Systems:  Constitutional: denies fever, chills  HEENT: denies cough or congestion  Respiratory: denies any shortness of breath  Cardiovascular: denies chest pain or palpitations  Gastrointestinal: denied abdominal pain, denied nausea/emesis, + distension Genitourinary: denies burning with urination or urinary frequency Musculoskeletal: denies pain, decreased motor or sensation  Vital signs in last 24 hours: [min-max] current  Temp:  [97.6 F (36.4 C)-98.4 F (36.9 C)] 98.4 F (36.9 C) (01/29 0806) Pulse Rate:  [79-88] 88 (01/29 0806) Resp:  [16-20] 16 (01/29 0806) BP: (159-188)/(68-84) 159/68 (01/29 0806) SpO2:  [96 %-100 %] 96 % (01/29 0806)     Height: 5' (152.4 cm) Weight: 44.9 kg BMI (Calculated): 19.33   Intake/Output last 2 shifts:  01/28 0701 - 01/29 0700 In: 1047.3 [I.V.:487.3; IV Piggyback:560] Out: -    Physical Exam:  Constitutional: alert, cooperative and no distress  HENT: normocephalic without obvious abnormality  Eyes: PERRL, EOM's grossly intact and symmetric  Respiratory: breathing non-labored at rest  Cardiovascular: regular rate and sinus rhythm  Gastrointestinal: soft, non-tender, she is distended, no rebound/guarding. She is not peritonitic Musculoskeletal: no edema or wounds, motor and sensation grossly intact, NT    Labs:     Latest Ref Rng & Units 03/08/2023    5:51 AM 03/06/2023    5:58 AM 03/05/2023    5:25 AM  CBC  WBC 4.0 - 10.5 K/uL 8.5  8.2  5.5   Hemoglobin 12.0 - 15.0 g/dL 9.9  9.0  8.9   Hematocrit 36.0 - 46.0 % 29.8   27.3  27.8   Platelets 150 - 400 K/uL 182  185  184       Latest Ref Rng & Units 03/08/2023    5:51 AM 03/07/2023    5:29 AM 03/06/2023    5:58 AM  CMP  Glucose 70 - 99 mg/dL 604  540  981   BUN 8 - 23 mg/dL 14  18  27    Creatinine 0.44 - 1.00 mg/dL 1.91  4.78  2.95   Sodium 135 - 145 mmol/L 139  142  142   Potassium 3.5 - 5.1 mmol/L 3.9  3.2  3.1   Chloride 98 - 111 mmol/L 113  114  109   CO2 22 - 32 mmol/L 19  22  24    Calcium 8.9 - 10.3 mg/dL 8.8  9.0  9.0     Imaging studies:  No new imaging studies    Assessment/Plan: (ICD-10's: K76.609) 78 y.o. female with question of SBO on admission although continues to have bowel function, now with what appears to be worsening bowel dilation on KUB this AM which may be sequela of MS vs severe electrolyte derangements vs constipation   - Okay to continue diet as tolerated   - Appreciate GI recommendations; she is agreeable with rectal tube now   - She needs her electrolyte derangements corrected   - If nausea/emesis recur, she will likely benefit from NGT decompression  - No need for emergent surgical intervention. - Serial KUB as needed; can consider gastrografin challenge although no behaving clinically like obstruction - Monitor  abdomina examination; on-going bowel function - Pain control prn; limit/avoid narcotics - Antiemetics prn - Mobilize as feasible  - Further management per primary service; we will follow   All of the above findings and recommendations were discussed with the patient, and the medical team, and all of patient's questions were answered to her expressed satisfaction.  -- Lynden Oxford, PA-C [facility] 03/08/2023, 9:42 AM M-F: 7am - 4pm

## 2023-03-08 NOTE — TOC Initial Note (Addendum)
Transition of Care [facility]) - Initial/Assessment Note    Patient Details  Name: Katrina Rivera MRN: [id number] Date of Birth: August 30, 1945  [facility] CM/SW Contact:    Margarito Liner, LCSW Phone Number: 03/08/2023, 1:23 PM  Clinical Narrative:  Readmission prevention screen complete. CSW met with patient. Husband at bedside. CSW introduced role and explained that discharge planning would be discussed. PCP is [name], MD. Husband drives her to appointments. Pharmacy is [facility] in [place]. No issues obtaining medications. Patient lives home with her husband. No home health prior to admission but she was going to outpatient PT. Patient and husband are agreeable to home health if therapy. Provided CMS scores. First preference is [facility]. Liaison is checking to see if they can accept referral. Patient has a cane, RW, BSC, and shower chair at home. No further concerns. CSW will continue to follow patient and her husband for support and facilitate return home once stable. Husband will transport her home at discharge.               1:33 pm: [facility] has accepted referral for PT, OT, RN.  Expected Discharge Plan: Home w Home Health Services Barriers to Discharge: Barriers Resolved   Patient Goals and CMS Choice            Expected Discharge Plan and Services     Post Acute Care Choice: Home Health Living arrangements for the past 2 months: Single Family Home                                      Prior Living Arrangements/Services Living arrangements for the past 2 months: Single Family Home Lives with:: Spouse Patient language and need for interpreter reviewed:: Yes Do you feel safe going back to the place where you live?: Yes      Need for Family Participation in Patient Care: Yes (Comment) Care giver support system in place?: Yes (comment) Current home services: DME Criminal Activity/Legal Involvement Pertinent to Current  Situation/Hospitalization: No - Comment as needed  Activities of Daily Living   ADL Screening (condition at time of admission) Independently performs ADLs?: No Does the patient have a NEW difficulty with bathing/dressing/toileting/self-feeding that is expected to last >3 days?: Yes (Initiates electronic notice to provider for possible OT consult) Does the patient have a NEW difficulty with getting in/out of bed, walking, or climbing stairs that is expected to last >3 days?: Yes (Initiates electronic notice to provider for possible PT consult) Does the patient have a NEW difficulty with communication that is expected to last >3 days?: No Is the patient deaf or have difficulty hearing?: No Does the patient have difficulty seeing, even when wearing glasses/contacts?: No Does the patient have difficulty concentrating, remembering, or making decisions?: No  Permission Sought/Granted Permission sought to share information with : Facility Medical sales representative, Family Supports Permission granted to share information with : Yes, Verbal Permission Granted  Share Information with NAME: Tashonna Descoteaux  Permission granted to share info w AGENCY: Home Health Agencies  Permission granted to share info w Relationship: Husband  Permission granted to share info w Contact Information: [phone]  Emotional Assessment Appearance:: Appears stated age Attitude/Demeanor/Rapport: Engaged, Gracious Affect (typically observed): Accepting, Appropriate, Calm, Pleasant Orientation: : Oriented to Self, Oriented to Place, Oriented to  Time, Oriented to Situation Alcohol / Substance Use: Not Applicable Psych Involvement: No (comment)  Admission diagnosis:  SBO (small bowel obstruction) (HCC) [K56.609] Generalized abdominal pain [R10.84] Nausea vomiting and diarrhea [R11.2, R19.7] Intestinal obstruction, unspecified cause, unspecified whether partial or complete (HCC) [K56.609] Patient Active Problem List    Diagnosis Date Noted   Intestinal obstruction (HCC) 03/08/2023   Ileus (HCC) 03/06/2023   Metabolic acidosis 03/05/2023   AKI (acute kidney injury) (HCC) 03/05/2023   Fecal impaction (HCC) 03/05/2023   Nausea vomiting and diarrhea 03/04/2023   Myofascial pain dysfunction syndrome 11/16/2022   Vertigo 11/16/2022   Lumbar burst fracture (HCC) 08/23/2022   Lumbar vertebral fracture (HCC) 08/19/2022   Chronic migraine w/o aura w/o status migrainosus, not intractable 03/29/2022   Mixed hyperlipidemia 12/27/2021   Chest pain of uncertain etiology 12/27/2021   New onset headache 12/22/2021   S/P lumbar fusion 07/30/2021   Primary localized osteoarthritis of left knee 08/11/2020   Atrophic vaginitis 08/11/2020   Menopausal symptom 08/11/2020   Restless leg syndrome    Chronic back pain    Low back pain without sciatica 11/01/2018   Essential hypertension    SBO (small bowel obstruction) (HCC) 09/01/2016   Fibromyalgia    Abdominal pain, vomiting, and diarrhea    Dehydration    Somnolence, daytime 10/22/2015   Fatigue 10/22/2015   Chronic leg pain 06/29/2015   S/P lumbar spinal fusion 06/18/2014   Abnormality of gait 02/18/2013   S/P lumbar microdiscectomy 01/24/2013   Hypokalemia 12/24/2012   Sweating abnormality 10/19/2012   Dysautonomia orthostatic hypotension syndrome 08/16/2012   Hereditary and idiopathic peripheral neuropathy 08/16/2012   Chronic adrenal insufficiency (HCC) 08/16/2012   MS (multiple sclerosis) (HCC) 08/15/2012   PCP:  Noberto Retort, MD Pharmacy:   [facility], [place] - [address] [facility] [address] [place] [address] Phone: [phone] Fax: [phone]  [facility] [address] [place] [address] Phone: [phone] Fax: [phone]     Social Drivers of Health (SDOH) Social History: SDOH Screenings   Food Insecurity: No Food Insecurity (03/05/2023)   Housing: Low Risk  (03/05/2023)  Transportation Needs: No Transportation Needs (03/05/2023)  Utilities: Not At Risk (03/05/2023)  Depression (PHQ2-9): Low Risk  (02/15/2023)  Social Connections: Moderately Integrated (03/05/2023)  Tobacco Use: Low Risk  (03/05/2023)   SDOH Interventions:     Readmission Risk Interventions    03/08/2023    1:22 PM  Readmission Risk Prevention Plan  Transportation Screening Complete  PCP or Specialist Appt within 3-5 Days Complete  HRI or Home Care Consult Complete  Social Work Consult for Recovery Care Planning/Counseling Complete  Palliative Care Screening Not Applicable  Medication Review Oceanographer) Complete

## 2023-03-09 ENCOUNTER — Ambulatory Visit (HOSPITAL_BASED_OUTPATIENT_CLINIC_OR_DEPARTMENT_OTHER): Payer: PPO | Admitting: Internal Medicine

## 2023-03-09 DIAGNOSIS — K567 Ileus, unspecified: Secondary | ICD-10-CM | POA: Diagnosis not present

## 2023-03-09 DIAGNOSIS — K6389 Other specified diseases of intestine: Secondary | ICD-10-CM | POA: Diagnosis not present

## 2023-03-09 DIAGNOSIS — K56609 Unspecified intestinal obstruction, unspecified as to partial versus complete obstruction: Secondary | ICD-10-CM

## 2023-03-09 LAB — BASIC METABOLIC PANEL
Anion gap: 8 (ref 5–15)
BUN: 13 mg/dL (ref 8–23)
CO2: 20 mmol/L — ABNORMAL LOW (ref 22–32)
Calcium: 8.5 mg/dL — ABNORMAL LOW (ref 8.9–10.3)
Chloride: 108 mmol/L (ref 98–111)
Creatinine, Ser: 0.77 mg/dL (ref 0.44–1.00)
GFR, Estimated: >60 mL/min (ref >60)
Glucose, Bld: 93 mg/dL (ref 70–99)
Potassium: 3.1 mmol/L — ABNORMAL LOW (ref 3.5–5.1)
Sodium: 136 mmol/L (ref 135–145)

## 2023-03-09 LAB — GLUCOSE, CAPILLARY
Glucose-Capillary: 104 mg/dL — ABNORMAL HIGH (ref 70–99)
Glucose-Capillary: 92 mg/dL (ref 70–99)
Glucose-Capillary: 71 mg/dL (ref 70–99)
Glucose-Capillary: 141 mg/dL — ABNORMAL HIGH (ref 70–99)

## 2023-03-09 LAB — MAGNESIUM: Magnesium: 1.8 mg/dL (ref 1.7–2.4)

## 2023-03-09 LAB — PHOSPHORUS: Phosphorus: 1.8 mg/dL — ABNORMAL LOW (ref 2.5–4.6)

## 2023-03-09 MED ORDER — ENSURE ENLIVE PO LIQD
237.0000 mL | Freq: Three times a day (TID) | ORAL | Status: DC
  Administered 2023-03-09 – 2023-03-10 (×3): 237 mL via ORAL

## 2023-03-09 MED ORDER — ADULT MULTIVITAMIN W/MINERALS CH
1.0000 | ORAL_TABLET | Freq: Every day | ORAL | Status: DC
  Administered 2023-03-10: 1 via ORAL
  Filled 2023-03-09: qty 1

## 2023-03-09 MED ORDER — THIAMINE HCL 100 MG PO TABS
100.0000 mg | ORAL_TABLET | Freq: Every day | ORAL | Status: DC
  Administered 2023-03-10: 100 mg via ORAL
  Filled 2023-03-09: qty 1

## 2023-03-09 MED ORDER — POTASSIUM CHLORIDE CRYS ER 20 MEQ PO TBCR
40.0000 meq | EXTENDED_RELEASE_TABLET | Freq: Once | ORAL | Status: AC
  Administered 2023-03-09: 40 meq via ORAL

## 2023-03-09 MED ORDER — K PHOS MONO-SOD PHOS DI & MONO 155-852-130 MG PO TABS
500.0000 mg | ORAL_TABLET | ORAL | Status: AC
  Administered 2023-03-09 – 2023-03-10 (×4): 500 mg via ORAL
  Filled 2023-03-09 (×4): qty 2

## 2023-03-09 MED ORDER — POTASSIUM CHLORIDE CRYS ER 20 MEQ PO TBCR
40.0000 meq | EXTENDED_RELEASE_TABLET | ORAL | Status: AC
Start: 2023-03-09 — End: 2023-03-09
  Administered 2023-03-09 (×2): 40 meq via ORAL
  Filled 2023-03-09 (×2): qty 2

## 2023-03-09 MED ORDER — MAGNESIUM SULFATE 2 GM/50ML IV SOLN
2.0000 g | Freq: Once | INTRAVENOUS | Status: AC
  Administered 2023-03-09: 2 g via INTRAVENOUS
  Filled 2023-03-09: qty 50

## 2023-03-09 MED ORDER — HYDROCORTISONE SOD SUC (PF) 100 MG IJ SOLR
50.0000 mg | Freq: Two times a day (BID) | INTRAMUSCULAR | Status: DC
  Administered 2023-03-09 – 2023-03-10 (×3): 50 mg via INTRAVENOUS
  Filled 2023-03-09 (×4): qty 1

## 2023-03-09 NOTE — Progress Notes (Signed)
Initial Nutrition Assessment  DOCUMENTATION CODES:   Severe malnutrition in context of chronic illness  INTERVENTION:  - Pt at high refeed risk; recommend monitor potassium, magnesium and phosphorus labs daily until stable -Ensure Enlive po TID, each supplement provides 350 kcal and 20 grams of protein. -Magic cup TID with meals, each supplement provides 290 kcal and 9 grams of protein -MVI with minerals daily -Thiamine 100 mg po daily for 7 days -Advanced diet to soft foods per MD approval  -Monitor weight trends -Zinc lab pending  NUTRITION DIAGNOSIS:   Severe Malnutrition related to chronic illness as evidenced by severe muscle depletion, percent weight loss.  GOAL:   Patient will meet greater than or equal to 90% of their needs  MONITOR:   PO intake, Weight trends, Labs, Diet advancement, Supplement acceptance  REASON FOR ASSESSMENT:   Malnutrition Screening Tool    ASSESSMENT:   Pt presented to ED for nausea, diarrhea, vomiting and abdominal cramping, found to have severe constipation. Pt with PMH of IBS, fibromyalgia, anemia, restless leg syndrome, MS, Addison's disease, dysautonomia. Hx of cholecystectomy and total cholectomy   1/30: Rectal tube placed; distention & nausea improved. May trial soft/regular diet today  Pt's husband at bedside during visit and helped provide nutritional hx. Pt  acknowledged needing nutrition to start feeling better. She reports not having vomiting episodes for a couple of days now. Pt has been on full liquid diet since 1/27 and reports being hungry and wanting to try soft foods. RD advanced diet today to soft diet (w/ meats and veggies chopped) with MD's approval. Pt reports difficulty swallowing and chewing larger pieces of food and says husband will cut up meats and veggies for her at home. Pt's husband reports pt has not eaten since last Wednesday, 1/22 before she was admitted due to the vomiting. Pt reports she has been drinking Sprite  and trying the Boost while being on full liquids but does not prefer the Boost taste anymore given that she has had taste changes recently. Pt reports she usually eats 1 meal a day which is chicken, egg, or tuna salad and will sometimes have a Boost in the morning and/or afternoon. She reports not drinking any fluids during her vomiting and diarrhea episodes but will drink sips of a sprite to get something in her stomach. Pt seems open to trying magic cup and a different nutrition supplement drink. Pt reports wanting to stop losing weight. Discussed importance of the Ensure and eating once diet advances to prevent further weight loss.   Pt actively refeeding. Will add thiamine 100 mg daily for 7 days.  Pt reports having slowly lost weight since November. Pt reports having a usual body weight of 115 lb in November and thinks she currently weighs 99 lbs. Per chart review she weighed 110 lb back in November and currently weighs 97.7 lbs. This is a 11% weight loss in 3 months which is clinically significant.   Pt reports she has been having a harder time getting around the home. She reports using a walker and needing help from her husband to bathe and get out of bed some days.  Ordered zinc labs due to suspicion of deficiency. Pt had white tongue, taste changes, and has had recurrent diarrhea.  Meds: Potassium chloride 40 mEq Q4h, Senokot BID, phosphorus 500 mg Q4h  Labs:  Glucose 70-169, Potassium low, calcium low, phosphorus low  NUTRITION - FOCUSED PHYSICAL EXAM:  Flowsheet Row Most Recent Value  Orbital Region Mild depletion  Upper Arm Region Severe depletion  Thoracic and Lumbar Region Moderate depletion  Buccal Region Moderate depletion  Temple Region Moderate depletion  Clavicle Bone Region Severe depletion  Clavicle and Acromion Bone Region Severe depletion  Scapular Bone Region Severe depletion  Dorsal Hand Moderate depletion  Patellar Region Severe depletion  Anterior Thigh Region  Severe depletion  Posterior Calf Region Severe depletion  Edema (RD Assessment) None  Hair Reviewed  Eyes Reviewed  Mouth Reviewed  [white tongue, taste changes]  Skin Reviewed  Nails Reviewed       Diet Order:   Diet Order             DIET SOFT Room service appropriate? Yes; Fluid consistency: Thin  Diet effective now                   EDUCATION NEEDS:   Education needs have been addressed  Skin:  Skin Assessment: Reviewed RN Assessment  Last BM:  1/28, 900 ml via rectal tube  Height:   Ht Readings from Last 1 Encounters:  03/03/23 5' (1.524 m)    Weight:   Wt Readings from Last 1 Encounters:  03/09/23 44.4 kg    Ideal Body Weight:  47.7 kg  BMI:  Body mass index is 19.12 kg/m.  Estimated Nutritional Needs:   Kcal:  1300-1500  Protein:  65-75 g  Fluid:  1.2-1.4 L    Maceo Pro, MS Dietetic Intern

## 2023-03-09 NOTE — Progress Notes (Signed)
Physical Therapy Treatment Patient Details Name: Katrina Rivera MRN: [id number] DOB: 02-Feb-1946 Today's Date: 03/09/2023   History of Present Illness Katrina Rivera is a 78 y/o female presenting on 03/02/23 with abdominal pain as well as nausea and vomiting and diarrhea. PMH: addison's disease, anemia, anxiety, arthritis, chronic back pain, depression, fibromyalgia, hypotension, IBS, MS, peripheral neuropathy, syncope, spinal cord stimulator, prior back surgery.    PT Comments  Pt agreeable to session- flexiseal now in place, pt has not been mobile much since commencement. Pt assisted with transition from EOB to [facility] to assist with pericare and troubleshoot flexiseal leak. Once situated, clean, and drain functioning, pt tolerates AMB in room at supervision level with a few longer bouts into the hallway and back, limiting distance out of room due to concerns of leakage again. RN made aware, in room at end of session. Pt happy to be mobile again, denies any limiting pain or discomfort from flexiseal. Will continue to follow.    If plan is discharge home, recommend the following: A little help with walking and/or transfers;Help with stairs or ramp for entrance;Assist for transportation;Assistance with cooking/housework   Can travel by private vehicle        Equipment Recommendations  None recommended by PT    Recommendations for Other Services       Precautions / Restrictions Precautions Precautions: Fall Restrictions Other Position/Activity Restrictions: BM incontinence - should have pull ups from home in room that spouse brought     Mobility  Bed Mobility Overal bed mobility: Needs Assistance Bed Mobility: Supine to Sit     Supine to sit: Supervision     General bed mobility comments: flexiseal in place    Transfers Overall transfer level: Needs assistance Equipment used: Rolling walker (2 wheels) Transfers: Sit to/from Stand Sit to Stand: Supervision            General transfer comment: steady, safe, use of RW, cues to use targetted footing to avoid slippery spots on floor.    Ambulation/Gait Ambulation/Gait assistance: Supervision, Contact guard assist Gait Distance (Feet): 260 Feet Assistive device: Rolling walker (2 wheels) Gait Pattern/deviations: WFL(Within Functional Limits), Step-through pattern       General Gait Details: still moving fairly well despite minimal PO tolerance last 2 days   Stairs             Wheelchair Mobility     Tilt Bed    Modified Rankin (Stroke Patients Only)       Balance                                            Cognition Arousal: Alert Behavior During Therapy: WFL for tasks assessed/performed Overall Cognitive Status: Within Functional Limits for tasks assessed                                          Exercises Other Exercises Other Exercises: assist with pericare at [facility], doff donn of new pullup, reroute of flexiseal tubing    General Comments        Pertinent Vitals/Pain Pain Assessment Pain Assessment: No/denies pain    Home Living  Prior Function            PT Goals (current goals can now be found in the care plan section) Acute Rehab PT Goals Patient Stated Goal: regain stength and baseline mobility PT Goal Formulation: With patient Time For Goal Achievement: 03/21/23 Potential to Achieve Goals: Good Progress towards PT goals: Progressing toward goals    Frequency    Min 1X/week      PT Plan      Co-evaluation              AM-PAC PT "6 Clicks" Mobility   Outcome Measure  Help needed turning from your back to your side while in a flat bed without using bedrails?: None Help needed moving from lying on your back to sitting on the side of a flat bed without using bedrails?: None Help needed moving to and from a bed to a chair (including a wheelchair)?: A Little Help needed  standing up from a chair using your arms (e.g., wheelchair or bedside chair)?: A Little Help needed to walk in hospital room?: A Little Help needed climbing 3-5 steps with a railing? : A Lot 6 Click Score: 19    End of Session   Activity Tolerance: Patient tolerated treatment well;No increased pain Patient left: with family/visitor present;in bed;with nursing/sitter in room Nurse Communication: Mobility status PT Visit Diagnosis: Unsteadiness on feet (R26.81);Other abnormalities of gait and mobility (R26.89);Difficulty in walking, not elsewhere classified (R26.2);Muscle weakness (generalized) (M62.81)     Time: 9604-5409 PT Time Calculation (min) (ACUTE ONLY): 37 min  Charges:    $Therapeutic Activity: 23-37 mins PT General Charges $$ ACUTE PT VISIT: 1 Visit                    4:44 PM, 03/09/23 Rosamaria Lints, PT, DPT Physical Therapist - [facility]  [phone] (ASCOM)    Caidyn Blossom C 03/09/2023, 4:41 PM

## 2023-03-09 NOTE — Progress Notes (Signed)
PHARMACY CONSULT NOTE - ELECTROLYTES  Pharmacy Consult for Electrolyte Monitoring and Replacement   Recent Labs: Height: 5' (152.4 cm) Weight: 44.9 kg (99 lb) IBW/kg (Calculated) : 45.5 Estimated Creatinine Clearance: 43.1 mL/min (by C-G formula based on SCr of 0.77 mg/dL). Potassium (mmol/L)  Date Value  03/09/2023 3.1 (L)   Magnesium (mg/dL)  Date Value  40/98/1191 1.8   Calcium (mg/dL)  Date Value  47/82/9562 8.5 (L)   Albumin (g/dL)  Date Value  13/09/6576 4.1   Phosphorus (mg/dL)  Date Value  46/96/2952 1.8 (L)   Sodium (mmol/L)  Date Value  03/09/2023 136  02/06/2017 140    Ca: 8.5 mg/dL  Assessment  Katrina Rivera is a 78 y.o. female presenting with diarrhea, fecal impaction, ileus. PMH significant for Addison's disease on chronic Cortef, colon cancer s/p partial colectomy, chronic back pain on narcotics, chronic constipation, peripheral neuropathy, HTN, anxiety/depression, MS, RLS, fibromyalgia . Pharmacy has been consulted to monitor and replace electrolytes.  Diet: full liquid MIVF: none Pertinent medications:    Goal of Therapy: Electrolytes WNL  Plan:  K 3.1  MD ordered KCL 40 meq po q4h x 2   Phos 1.8  will order Kphos-neutral 500mg  PO q4h x 4 doses Mag 1.8  will order Magnesium sulfate 2 gm IV x1 Check BMP, Mg, Phos with AM labs  Thank you for allowing pharmacy to be a part of this patient's care.  Angelique Blonder, PharmD Clinical Pharmacist 03/09/2023 12:54 PM

## 2023-03-09 NOTE — Progress Notes (Signed)
Progress Note    Katrina Rivera  WJX:[id number] DOB: 09-17-1945  DOA: 03/04/2023 PCP: Noberto Retort, MD      Brief Narrative:    Medical records reviewed and are as summarized below:  Katrina Rivera is a 78 y.o. female  with medical history significant of Addison's disease on chronic Cortef, colon cancer status post partial colectomy, chronic back pain on narcotics, multiple sclerosis peripheral neuropathy, HTN, anxiety/depression, who presented to the hospital with abdominal pain, abdominal distention and diarrhea.  CT scan abdomen/pelvis showed large stool in rectum, consistent with fecal impaction.  Patient was given fluids and symptomatic treatment. Patient was given lactulose, she had a multiple loose stools, repeat KUB still has dilation in the colon as well as in the small bowel, consistent with ileus.      Assessment/Plan:   Active Problems:   MS (multiple sclerosis) (HCC)   Chronic adrenal insufficiency (HCC)   Hypokalemia   Nausea vomiting and diarrhea   Metabolic acidosis   AKI (acute kidney injury) (HCC)   Fecal impaction (HCC)   Ileus (HCC)   SBO (small bowel obstruction) (HCC)    Body mass index is 19.33 kg/m.   Nausea vomiting secondary to fecal impaction. Ileus. Patient has a chronic constipation alternating with diarrhea.  She came to the hospital with nausea vomiting and stool in the rectum, after arriving the hospital, she had multiple large bowel movements. Patient CT scan results was consistent with fecal impaction. Patient was giving senna, refused lactulose, having multiple loose stools, but repeated KUB still showed diffuse dilation including colon and small bowel. Differential diagnosis include ileus, gastroparesis, MS related dysmotility Patient has decided to try rectal tube. Rectal tube remains in place.  Plan to continue rectal tube for another 24 hours per surgeon.   Acute kidney injury Metabolic  acidosis. Hypokalemia. Hypophosphatemia Continue potassium and phosphorus repletion. Creatinine has improved. Anion gap metabolic acidosis likely from diarrhea.    Hypoglycemia secondary to NPO and adrenal insufficiency. Addison disease with adrenal crisis. Glucose levels stable off of IV fluids. Decrease IV hydrocortisone to 100 mg every 12 hours to 50 mg every 12 hours.    Multiple sclerosis. No acute issues Follow-up with PCP as outpatient.      Comorbidities include hypertension, fibromyalgia, arthritis, depression, anxiety, chronic back pain, IBS, peripheral neuropathy, restless leg syndrome   Possible discharge to home tomorrow if she continues to improve.   Diet Order             Diet full liquid Room service appropriate? Yes; Fluid consistency: Thin  Diet effective now                            Consultants: General surgeon Gastroenterologist  Procedures: None    Medications:    amLODipine  2.5 mg Oral Daily   buPROPion  300 mg Oral q morning   gabapentin  300 mg Oral Q0600   heparin  5,000 Units Subcutaneous Q12H   hydrocortisone sod succinate (SOLU-CORTEF) inj  50 mg Intravenous Q12H   lactulose  20 g Oral Once   metoCLOPramide (REGLAN) injection  5 mg Intravenous Q8H   pantoprazole (PROTONIX) IV  40 mg Intravenous Q24H   phosphorus  500 mg Oral Q4H   potassium chloride  40 mEq Oral Q4H   QUEtiapine  25 mg Oral QHS   senna-docusate  2 tablet Oral BID   tiZANidine  4 mg Oral  QHS   topiramate  50 mg Oral QHS   Continuous Infusions:  magnesium sulfate bolus IVPB       Anti-infectives (From admission, onward)    None              Family Communication/Anticipated D/C date and plan/Code Status   DVT prophylaxis: heparin injection 5,000 Units Start: 03/04/23 1000     Code Status: Full Code  Family Communication: None Disposition Plan: Discharge home   Status is: Inpatient Remains inpatient appropriate because:  Ileus       Subjective:   Interval events noted.  No vomiting overnight.  Abdominal pain is better.  She thinks rectal tube is helping.  Objective:    Vitals:   03/08/23 1547 03/08/23 1949 03/09/23 0348 03/09/23 0751  BP: (!) 150/66 (!) 147/86 (!) 154/69 (!) 154/84  Pulse: 82 78 78 73  Resp: 18 16 16 18   Temp: 99.2 F (37.3 C) 98 F (36.7 C) 98.2 F (36.8 C) 99 F (37.2 C)  TempSrc: Oral Oral Oral   SpO2: 98% 96% 98% 98%  Weight:      Height:       No data found.   Intake/Output Summary (Last 24 hours) at 03/09/2023 1259 Last data filed at 03/08/2023 1800 Gross per 24 hour  Intake 510 ml  Output 900 ml  Net -390 ml   Filed Weights   03/03/23 2047  Weight: 44.9 kg    Exam:  GEN: NAD SKIN: Warm and dry EYES: No pallor or icterus ENT: MMM CV: RRR PULM: CTA B ABD: soft, ND, NT, +BS CNS: AAO x 3, non focal EXT: Right leg edema, no tenderness Rectal tube in place        Data Reviewed:   I have personally reviewed following labs and imaging studies:  Labs: Labs show the following:   Basic Metabolic Panel: Recent Labs  Lab 03/03/23 2052 03/05/23 0525 03/06/23 0558 03/07/23 0529 03/08/23 0551 03/09/23 0635  NA 136 142 142 142 139 136  K 3.7 3.1* 3.1* 3.2* 3.9 3.1*  CL 101 117* 109 114* 113* 108  CO2 21* 14* 24 22 19* 20*  GLUCOSE 150* 69* 144* 124* 100* 93  BUN 28* 40* 27* 18 14 13   CREATININE 1.75* 1.29* 0.89 0.86 0.63 0.77  CALCIUM 11.4* 8.7* 9.0 9.0 8.8* 8.5*  MG 2.0  --  1.8 1.7 2.0 1.8  PHOS  --   --  <1.0* 1.7* 1.4* 1.8*   GFR Estimated Creatinine Clearance: 43.1 mL/min (by C-G formula based on SCr of 0.77 mg/dL). Liver Function Tests: Recent Labs  Lab 03/03/23 2052  AST 40  ALT 35  ALKPHOS 47  BILITOT 0.8  PROT 7.3  ALBUMIN 4.1   Recent Labs  Lab 03/03/23 2052  LIPASE 30   No results for input(s): "AMMONIA" in the last 168 hours. Coagulation profile No results for input(s): "INR", "PROTIME" in the last 168  hours.  CBC: Recent Labs  Lab 03/03/23 2052 03/05/23 0525 03/06/23 0558 03/08/23 0551  WBC 7.4 5.5 8.2 8.5  HGB 11.7* 8.9* 9.0* 9.9*  HCT 36.7 27.8* 27.3* 29.8*  MCV 97.6 98.9 93.2 95.2  PLT 228 184 185 182   Cardiac Enzymes: No results for input(s): "CKTOTAL", "CKMB", "CKMBINDEX", "TROPONINI" in the last 168 hours. BNP (last 3 results) No results for input(s): "PROBNP" in the last 8760 hours. CBG: Recent Labs  Lab 03/08/23 1143 03/08/23 1745 03/08/23 2116 03/09/23 0800 03/09/23 1126  GLUCAP 141* 70 96  104* 92   D-Dimer: No results for input(s): "DDIMER" in the last 72 hours. Hgb A1c: No results for input(s): "HGBA1C" in the last 72 hours. Lipid Profile: No results for input(s): "CHOL", "HDL", "LDLCALC", "TRIG", "CHOLHDL", "LDLDIRECT" in the last 72 hours. Thyroid function studies: No results for input(s): "TSH", "T4TOTAL", "T3FREE", "THYROIDAB" in the last 72 hours.  Invalid input(s): "FREET3" Anemia work up: Recent Labs    03/07/23 0529 03/07/23 0831  VITAMINB12  --  2,125*  FERRITIN 124  --   TIBC 266  --   IRON 47  --    Sepsis Labs: Recent Labs  Lab 03/03/23 2052 03/05/23 0525 03/06/23 0558 03/08/23 0551  WBC 7.4 5.5 8.2 8.5    Microbiology Recent Results (from the past 240 hours)  Gastrointestinal Panel by PCR , Stool     Status: None   Collection Time: 03/04/23  8:09 AM   Specimen: Stool  Result Value Ref Range Status   Campylobacter species NOT DETECTED NOT DETECTED Final   Plesimonas shigelloides NOT DETECTED NOT DETECTED Final   Salmonella species NOT DETECTED NOT DETECTED Final   Yersinia enterocolitica NOT DETECTED NOT DETECTED Final   Vibrio species NOT DETECTED NOT DETECTED Final   Vibrio cholerae NOT DETECTED NOT DETECTED Final   Enteroaggregative E coli (EAEC) NOT DETECTED NOT DETECTED Final   Enteropathogenic E coli (EPEC) NOT DETECTED NOT DETECTED Final   Enterotoxigenic E coli (ETEC) NOT DETECTED NOT DETECTED Final   Shiga  like toxin producing E coli (STEC) NOT DETECTED NOT DETECTED Final   Shigella/Enteroinvasive E coli (EIEC) NOT DETECTED NOT DETECTED Final   Cryptosporidium NOT DETECTED NOT DETECTED Final   Cyclospora cayetanensis NOT DETECTED NOT DETECTED Final   Entamoeba histolytica NOT DETECTED NOT DETECTED Final   Giardia lamblia NOT DETECTED NOT DETECTED Final   Adenovirus F40/41 NOT DETECTED NOT DETECTED Final   Astrovirus NOT DETECTED NOT DETECTED Final   Norovirus GI/GII NOT DETECTED NOT DETECTED Final   Rotavirus A NOT DETECTED NOT DETECTED Final   Sapovirus (I, II, IV, and V) NOT DETECTED NOT DETECTED Final    Comment: Performed at [facility], [address] [place], [place] [address]  C Difficile Quick Screen w PCR reflex     Status: None   Collection Time: 03/04/23  8:09 AM   Specimen: Stool  Result Value Ref Range Status   C Diff antigen NEGATIVE NEGATIVE Final   C Diff toxin NEGATIVE NEGATIVE Final   C Diff interpretation No C. difficile detected.  Final    Comment: Performed at [facility], [address] [place], [place] [address]    Procedures and diagnostic studies:  No results found.              LOS: 5 days   Dailey Alberson  [facility]   Pager on [email or web address]. If 7PM-7AM, please contact night-coverage at [email or web address]     03/09/2023, 12:59 PM

## 2023-03-09 NOTE — Progress Notes (Signed)
[facility] SURGICAL PROGRESS NOTE (cpt [id number])  Hospital Day(s): 5.   Interval History: Patient seen and examined. No acute issues overnight. She has responded well to rectal tube placement; 900 ccs out. She reports feeling much better this morning. Distension improved. Nausea significantly improved. No fever, chills, emesis. Renal function normal; sCr - 0.77; UO - unmeasured. Hypokalemia to 3.1. Hypophosphatemia to 1.8. No new imaging this AM. She is on FLD; tolerating - hungry.   Review of Systems:  Constitutional: denies fever, chills  HEENT: denies cough or congestion  Respiratory: denies any shortness of breath  Cardiovascular: denies chest pain or palpitations  Gastrointestinal: denied abdominal pain, denied nausea/emesis, + distension (improved) Genitourinary: denies burning with urination or urinary frequency Musculoskeletal: denies pain, decreased motor or sensation  Vital signs in last 24 hours: [min-max] current  Temp:  [98 F (36.7 C)-99.2 F (37.3 C)] 98.2 F (36.8 C) (01/30 0348) Pulse Rate:  [78-88] 78 (01/30 0348) Resp:  [16-18] 16 (01/30 0348) BP: (147-159)/(66-86) 154/69 (01/30 0348) SpO2:  [96 %-98 %] 98 % (01/30 0348)     Height: 5' (152.4 cm) Weight: 44.9 kg BMI (Calculated): 19.33   Intake/Output last 2 shifts:  01/29 0701 - 01/30 0700 In: 510 [IV Piggyback:510] Out: 900 [Stool:900]   Physical Exam:  Constitutional: alert, cooperative and no distress  HENT: normocephalic without obvious abnormality  Eyes: PERRL, EOM's grossly intact and symmetric  Respiratory: breathing non-labored at rest  Cardiovascular: regular rate and sinus rhythm  Gastrointestinal: soft, non-tender, she is distended but improved, no rebound/guarding. She is not peritonitic. Now with rectal tube.  Musculoskeletal: no edema or wounds, motor and sensation grossly intact, NT    Labs:     Latest Ref Rng & Units 03/08/2023    5:51 AM 03/06/2023    5:58 AM 03/05/2023     5:25 AM  CBC  WBC 4.0 - 10.5 K/uL 8.5  8.2  5.5   Hemoglobin 12.0 - 15.0 g/dL 9.9  9.0  8.9   Hematocrit 36.0 - 46.0 % 29.8  27.3  27.8   Platelets 150 - 400 K/uL 182  185  184       Latest Ref Rng & Units 03/08/2023    5:51 AM 03/07/2023    5:29 AM 03/06/2023    5:58 AM  CMP  Glucose 70 - 99 mg/dL 604  540  981   BUN 8 - 23 mg/dL 14  18  27    Creatinine 0.44 - 1.00 mg/dL 1.91  4.78  2.95   Sodium 135 - 145 mmol/L 139  142  142   Potassium 3.5 - 5.1 mmol/L 3.9  3.2  3.1   Chloride 98 - 111 mmol/L 113  114  109   CO2 22 - 32 mmol/L 19  22  24    Calcium 8.9 - 10.3 mg/dL 8.8  9.0  9.0     Imaging studies:  No new imaging studies    Assessment/Plan: (ICD-10's: K48.609) 78 y.o. female with question of SBO on admission although continues to have bowel function, now with what appears to be worsening bowel dilation on KUB this AM which may be sequela of MS vs severe electrolyte derangements vs constipation   - We can trial soft/regular diet  - Appreciate GI recommendations; she is responding well to rectal tube placement. Would continue today.   - She needs her electrolyte derangements corrected   - No need for emergent surgical intervention. - Monitor abdomina examination; on-going bowel function -  Pain control prn; limit/avoid narcotics - Antiemetics prn - Mobilize as feasible  - Further management per primary service   - Nothing further from surgical perspective, no evidence of true obstruction. She is responding well to GI recommendations/rectal tube. Would continue another 24 hours. Can consider KUB to reassess improvement before removal. We will sign off but remain available as needed.   All of the above findings and recommendations were discussed with the patient, and the medical team, and all of patient's questions were answered to her expressed satisfaction.  -- Lynden Oxford, PA-C [facility] 03/09/2023, 7:20 AM M-F: 7am - 4pm

## 2023-03-10 DIAGNOSIS — K567 Ileus, unspecified: Secondary | ICD-10-CM | POA: Diagnosis not present

## 2023-03-10 DIAGNOSIS — E43 Unspecified severe protein-calorie malnutrition: Secondary | ICD-10-CM | POA: Insufficient documentation

## 2023-03-10 LAB — RENAL FUNCTION PANEL
Albumin: 3.4 g/dL — ABNORMAL LOW (ref 3.5–5.0)
Anion gap: 9 (ref 5–15)
BUN: 13 mg/dL (ref 8–23)
CO2: 22 mmol/L (ref 22–32)
Calcium: 8.3 mg/dL — ABNORMAL LOW (ref 8.9–10.3)
Chloride: 108 mmol/L (ref 98–111)
Creatinine, Ser: 0.78 mg/dL (ref 0.44–1.00)
GFR, Estimated: >60 mL/min (ref >60)
Glucose, Bld: 88 mg/dL (ref 70–99)
Phosphorus: 1.7 mg/dL — ABNORMAL LOW (ref 2.5–4.6)
Potassium: 3.2 mmol/L — ABNORMAL LOW (ref 3.5–5.1)
Sodium: 139 mmol/L (ref 135–145)

## 2023-03-10 LAB — PHOSPHORUS: Phosphorus: 5.3 mg/dL — ABNORMAL HIGH (ref 2.5–4.6)

## 2023-03-10 LAB — GLUCOSE, CAPILLARY: Glucose-Capillary: 108 mg/dL — ABNORMAL HIGH (ref 70–99)

## 2023-03-10 LAB — POTASSIUM: Potassium: 5.5 mmol/L — ABNORMAL HIGH (ref 3.5–5.1)

## 2023-03-10 LAB — MAGNESIUM: Magnesium: 2.3 mg/dL (ref 1.7–2.4)

## 2023-03-10 MED ORDER — ACETAMINOPHEN 325 MG PO TABS
650.0000 mg | ORAL_TABLET | Freq: Four times a day (QID) | ORAL | Status: DC | PRN
  Administered 2023-03-10: 650 mg via ORAL
  Filled 2023-03-10: qty 2

## 2023-03-10 MED ORDER — POTASSIUM CHLORIDE CRYS ER 20 MEQ PO TBCR
40.0000 meq | EXTENDED_RELEASE_TABLET | Freq: Once | ORAL | Status: AC
  Administered 2023-03-10: 40 meq via ORAL
  Filled 2023-03-10: qty 2

## 2023-03-10 MED ORDER — POTASSIUM PHOSPHATES 15 MMOLE/5ML IV SOLN
30.0000 mmol | Freq: Once | INTRAVENOUS | Status: AC
  Administered 2023-03-10: 30 mmol via INTRAVENOUS
  Filled 2023-03-10: qty 10

## 2023-03-10 MED ORDER — POTASSIUM CHLORIDE CRYS ER 20 MEQ PO TBCR
40.0000 meq | EXTENDED_RELEASE_TABLET | Freq: Two times a day (BID) | ORAL | Status: DC
  Administered 2023-03-10: 40 meq via ORAL
  Filled 2023-03-10: qty 2

## 2023-03-10 NOTE — Progress Notes (Incomplete)
 {  Select_TRH_Note:[id number]}

## 2023-03-10 NOTE — Progress Notes (Addendum)
PHARMACY CONSULT NOTE - ELECTROLYTES  Pharmacy Consult for Electrolyte Monitoring and Replacement   Recent Labs: Height: 5' (152.4 cm) Weight: 44.2 kg (97 lb 7.1 oz) IBW/kg (Calculated) : 45.5 Estimated Creatinine Clearance: 42.4 mL/min (by C-G formula based on SCr of 0.78 mg/dL). Potassium (mmol/L)  Date Value  03/10/2023 3.2 (L)   Magnesium (mg/dL)  Date Value  56/21/3086 2.3   Calcium (mg/dL)  Date Value  57/84/6962 8.3 (L)   Albumin (g/dL)  Date Value  95/28/4132 3.4 (L)   Phosphorus (mg/dL)  Date Value  44/02/270 1.7 (L)   Sodium (mmol/L)  Date Value  03/10/2023 139  02/06/2017 140    Corrected Ca: 8.8 mg/dL          Alb 3.4  Ca 8.3  Assessment  Katrina Rivera is a 78 y.o. female presenting with diarrhea, fecal impaction, ileus. PMH significant for Addison's disease on chronic Cortef, colon cancer s/p partial colectomy, chronic back pain on narcotics, chronic constipation, peripheral neuropathy, HTN, anxiety/depression, MS, RLS, fibromyalgia . Pharmacy has been consulted to monitor and replace electrolytes.  Diet: soft 1/30 MIVF: none Pertinent medications:    Goal of Therapy: Electrolytes WNL  Plan:  K 3.2  Will order KCL 40 meq po BID x 2 doses Phos 1.7  will order Potassium phosphate 30 mmol IV x 1 - infused over 6 hours (has 44 meq K) Will check Phos and K levels this evening after am replacement Check BMP, Mg, Phos with AM labs  Thank you for allowing pharmacy to be a part of this patient's care.  Angelique Blonder, PharmD Clinical Pharmacist 03/10/2023 10:40 AM

## 2023-03-10 NOTE — Progress Notes (Signed)
Pt rectal tube fell out while on BSC. Pt refusing to be placed back in. Notified NP [email or web address]

## 2023-03-10 NOTE — Progress Notes (Signed)
Occupational Therapy Treatment Patient Details Name: LEANDRIA THIER MRN: [id number] DOB: 01/02/1946 Today's Date: 03/10/2023   History of present illness Katrina Rivera is a 78 y/o female presenting on 03/02/23 with abdominal pain as well as nausea and vomiting and diarrhea. PMH: addison's disease, anemia, anxiety, arthritis, chronic back pain, depression, fibromyalgia, hypotension, IBS, MS, peripheral neuropathy, syncope, spinal cord stimulator, prior back surgery.   OT comments  Pt is supine in bed on arrival. Pleasant and agreeable to OT session. She denies pain. Pt performed bed mobility SUP, STS from EOB to RW with SUP, cueing to push up from EOB vs pulling on RW. She had just had a bath and performed oral care with assist from her nurse. Agreeable to mobility x320 feet using RW with SUP/SBA, no LOB although slower pace. Simulated LB dressing at EOB requiring SUP and extra time although she reports her husband assists her at home. She has appropriate DME for home and assist from spouse. Pt returned to bed with all needs in place and will cont to require skilled acute OT services to maximize her safety and IND to return to PLOF.       If plan is discharge home, recommend the following:  A little help with walking and/or transfers;A little help with bathing/dressing/bathroom;Assistance with cooking/housework;Assist for transportation;Help with stairs or ramp for entrance   Equipment Recommendations       Recommendations for Other Services      Precautions / Restrictions Precautions Precautions: Fall Restrictions Weight Bearing Restrictions Per Provider Order: No       Mobility Bed Mobility Overal bed mobility: Needs Assistance Bed Mobility: Supine to Sit, Sit to Supine     Supine to sit: Supervision Sit to supine: Supervision        Transfers Overall transfer level: Needs assistance Equipment used: Rolling walker (2 wheels) Transfers: Sit to/from Stand Sit to Stand:  Supervision           General transfer comment: SUP for STS and all mobility x2 laps around nursing station ~320 feet with slow pace, but steady and safe with no LOB     Balance Overall balance assessment: Modified Independent                                         ADL either performed or assessed with clinical judgement   ADL Overall ADL's : Needs assistance/impaired                     Lower Body Dressing: Sitting/lateral leans;Sit to/from stand;Supervision/safety Lower Body Dressing Details (indicate cue type and reason): simulated LB dressing and req increased time and CGA                    Extremity/Trunk Assessment              Vision       Perception     Praxis      Cognition Arousal: Alert Behavior During Therapy: WFL for tasks assessed/performed Overall Cognitive Status: Within Functional Limits for tasks assessed                                 General Comments: talkative, easily distracted, needs cues to stay on task        Exercises  Shoulder Instructions       General Comments      Pertinent Vitals/ Pain       Pain Assessment Pain Assessment: No/denies pain  Home Living                                          Prior Functioning/Environment              Frequency  Min 1X/week        Progress Toward Goals  OT Goals(current goals can now be found in the care plan section)  Progress towards OT goals: Progressing toward goals  Acute Rehab OT Goals Patient Stated Goal: return home OT Goal Formulation: With patient Time For Goal Achievement: 03/19/23 Potential to Achieve Goals: Good  Plan      Co-evaluation                 AM-PAC OT "6 Clicks" Daily Activity     Outcome Measure   Help from another person eating meals?: None Help from another person taking care of personal grooming?: None Help from another person toileting, which includes  using toliet, bedpan, or urinal?: A Little Help from another person bathing (including washing, rinsing, drying)?: A Little Help from another person to put on and taking off regular upper body clothing?: None Help from another person to put on and taking off regular lower body clothing?: A Little 6 Click Score: 21    End of Session Equipment Utilized During Treatment: Rolling walker (2 wheels);Gait belt  OT Visit Diagnosis: Other abnormalities of gait and mobility (R26.89);Muscle weakness (generalized) (M62.81);Repeated falls (R29.6)   Activity Tolerance Patient tolerated treatment well   Patient Left in bed;with call bell/phone within reach;with bed alarm set;with family/visitor present   Nurse Communication Mobility status        Time: 2130-8657 OT Time Calculation (min): 29 min  Charges: OT General Charges $OT Visit: 1 Visit OT Treatments $Self Care/Home Management : 8-22 mins $Therapeutic Activity: 8-22 mins  Hassaan Crite, OTR/L  03/10/23, 12:56 PM   Kalisa Girtman E Savaughn Karwowski 03/10/2023, 12:53 PM

## 2023-03-10 NOTE — Consult Note (Signed)
[facility] Liaison Note  03/10/2023  Katrina Rivera Jul 01, 1945 [id number]  Location: RN Hospital Liaison screened the patient remotely at [facility].  Insurance: [employer]   Katrina Rivera is a 78 y.o. female who is a Primary Care Patient of Noberto Retort, MD-[facility]. The patient was screened for  readmission hospitalization with noted high risk score for unplanned readmission risk with 1 IP/1 ED in 6 months.  The patient was assessed for potential Care Management service needs for post hospital transition for care coordination. Review of patient's electronic medical record reveals patient was admitted with N & V and diarrhea. Pt discharged with [facility]). No anticipated needs as pt followed by [facility]CP-[facility] for care management services. Liaison will alert [facility] team on pt's discharged disposition.   [facility] Care Management/Population Health does not replace or interfere with any arrangements made by the Inpatient Transition of Care team.   For questions contact:   Elliot Cousin, RN, [facility] Liaison [place]   [facility], Population Health Office Hours MTWF  8:00 am-6:00 pm Direct Dial: [phone] mobile [phone] [Office toll free line] Office Hours are M-F 8:30 - 5 pm [email or web address]

## 2023-03-10 NOTE — Progress Notes (Signed)
Dis charge instructions given to pt and husband. Both verbalize understanding. IV d/cd. All personal belonging take. Pt will be transported home by husband in private vehicle. No concerns voiced.

## 2023-03-10 NOTE — Plan of Care (Signed)

## 2023-03-11 NOTE — Discharge Summary (Signed)
Physician Discharge Summary   Patient: Katrina Rivera MRN: [id number] DOB: Aug 20, 1945  Admit date:     03/04/2023  Discharge date: 03/10/2023  Discharge Physician: Lurene Shadow   PCP: Noberto Retort, MD   Recommendations at discharge:    Follow up with PCP in 1 week  Discharge Diagnoses: Active Problems:   MS (multiple sclerosis) (HCC)   Chronic adrenal insufficiency (HCC)   Hypokalemia   Nausea vomiting and diarrhea   Metabolic acidosis   AKI (acute kidney injury) (HCC)   Fecal impaction (HCC)   Ileus (HCC)   SBO (small bowel obstruction) (HCC)   Protein-calorie malnutrition, severe  Resolved Problems:   * No resolved hospital problems. *  Hospital Course:  Katrina Rivera is a 78 y.o. female  with medical history significant of Addison's disease on chronic Cortef, colon cancer status post partial colectomy, chronic back pain on narcotics, multiple sclerosis peripheral neuropathy, HTN, anxiety/depression, who presented to the hospital with abdominal pain, abdominal distention and diarrhea.   CT scan abdomen/pelvis showed large stool in rectum, consistent with fecal impaction.  Patient was given fluids and symptomatic treatment. Patient was given lactulose, she had a multiple loose stools, repeat KUB still has dilation in the colon as well as in the small bowel, consistent with ileus.   Assessment and Plan:   Nausea vomiting secondary to fecal impaction. Ileus. Patient has chronic constipation alternating with diarrhea.  She came to the hospital with nausea vomiting and stool in the rectum. She had multiple large bowel movements. Patient's CT scan results was consistent with fecal impaction. Despite having multiple loose stools, repeated KUB still showed diffuse dilation including colon and small bowel. A rectal tube was placed and this made her feel better Differential diagnosis include ileus, gastroparesis, MS related dysmotility     Acute kidney  injury Metabolic acidosis. Hypokalemia. Hypophosphatemia AKI has improved. Potassium and phosphorus were repleted.  Repeat potassium and phosphorus levels were mildly elevated because blood sample that was tested was drawn while patient was receiving IV potassium phosphate infusion. Anion gap metabolic acidosis likely from diarrhea.     Hypoglycemia secondary to NPO and adrenal insufficiency. Addison disease with adrenal crisis. Hypoglycemia has resolved. Patient will resume Florinef and methylprednisolone for Addison's disease at discharge.     Multiple sclerosis. No acute issues Follow-up with PCP as outpatient.       Comorbidities include hypertension, fibromyalgia, arthritis, depression, anxiety, chronic back pain, IBS, peripheral neuropathy, restless leg syndrome   Her condition has improved and she is deemed stable for discharge to home.        Consultants: Gastroenterologist, general surgeon Procedures performed: None Disposition: Home Diet recommendation:  Discharge Diet Orders (From admission, onward)     Start     Ordered   03/10/23 0000  Diet - low sodium heart healthy        03/10/23 1730           Cardiac diet DISCHARGE MEDICATION: Allergies as of 03/10/2023       Reactions   Amoxicillin Other (See Comments)   Upset stomach   Aspirin    bruising   Azithromycin    stomach upset   Doxycycline Hyclate    GI upset   Hydroxyzine    Other Reaction(s): Hallucinations, Other   Hydroxyzine Hcl Other (See Comments)   Buspirone Palpitations   Buspirone Hcl Palpitations   Mirtazapine Palpitations   Weight gain        Medication List  STOP taking these medications    buPROPion 300 MG 24 hr tablet Commonly known as: WELLBUTRIN XL   cephALEXin 250 MG capsule Commonly known as: KEFLEX   DULoxetine 30 MG capsule Commonly known as: CYMBALTA   DULoxetine 60 MG capsule Commonly known as: CYMBALTA   gabapentin 300 MG capsule Commonly  known as: NEURONTIN   Gabapentin Enacarbil 600 MG Tbcr   mirtazapine 15 MG tablet Commonly known as: REMERON   oxyCODONE-acetaminophen 10-325 MG tablet Commonly known as: PERCOCET   oxyCODONE-acetaminophen 5-325 MG tablet Commonly known as: PERCOCET/ROXICET   promethazine 25 MG tablet Commonly known as: PHENERGAN       TAKE these medications    acetaminophen 325 MG tablet Commonly known as: TYLENOL Take 1-2 tablets (325-650 mg total) by mouth every 4 (four) hours as needed for mild pain.   albuterol 108 (90 Base) MCG/ACT inhaler Commonly known as: VENTOLIN HFA Inhale 2 puffs into the lungs every 6 (six) hours as needed for wheezing.   alfuzosin 10 MG 24 hr tablet Commonly known as: UROXATRAL Take 10 mg by mouth daily with breakfast.   amLODipine 2.5 MG tablet Commonly known as: NORVASC Take 1 tablet (2.5 mg total) by mouth daily.   bethanechol 10 MG tablet Commonly known as: URECHOLINE Take 10 mg by mouth 2 (two) times daily.   cholecalciferol 25 MCG (1000 UNIT) tablet Commonly known as: VITAMIN D3 Take 1,000 Units by mouth every morning.   clonazePAM 0.5 MG tablet Commonly known as: KLONOPIN Take 1 tablet (0.5 mg total) by mouth at bedtime as needed for anxiety. What changed: when to take this   CRANBERRY PO Take 1 tablet by mouth 2 (two) times daily.   dicyclomine 20 MG tablet Commonly known as: BENTYL Take 1 tablet by mouth 4 (four) times daily as needed for spasms.   ferrous sulfate 325 (65 FE) MG tablet Take 325 mg by mouth daily with breakfast.   fludrocortisone 0.1 MG tablet Commonly known as: FLORINEF Take 1/2 tablet daily.   fluticasone 0.05 % cream Commonly known as: CUTIVATE Apply 1 application  topically 2 (two) times daily as needed for irritation (Rosacea).   fluticasone 50 MCG/ACT nasal spray Commonly known as: FLONASE Place 1 spray into both nostrils daily as needed for allergies or rhinitis.   HYDROcodone-acetaminophen 5-325 MG  tablet Commonly known as: NORCO/VICODIN Take 1 tablet by mouth every 8 (eight) hours as needed for moderate pain (pain score 4-6). What changed: Another medication with the same name was removed. Continue taking this medication, and follow the directions you see here.   methylPREDNISolone 4 MG tablet Commonly known as: MEDROL as directed Orally 1 tablet AM and 1/2 tablet afternoon and take additional in case of illness as instructed.   multivitamin tablet Take 1 tablet by mouth in the morning.   mupirocin ointment 2 % Commonly known as: BACTROBAN Apply 1 Application topically at bedtime.   ondansetron 4 MG tablet Commonly known as: Zofran Take 1 tablet (4 mg total) by mouth every 8 (eight) hours as needed for nausea or vomiting. What changed: Another medication with the same name was added. Make sure you understand how and when to take each.   ondansetron 4 MG tablet Commonly known as: ZOFRAN Take 1 tablet (4 mg total) by mouth every 8 (eight) hours as needed for nausea or vomiting. What changed: You were already taking a medication with the same name, and this prescription was added. Make sure you understand how and when to  take each.   pantoprazole 20 MG tablet Commonly known as: PROTONIX Take 20 mg by mouth every morning.   polyethylene glycol 17 g packet Commonly known as: MiraLax Take 17 g by mouth 2 (two) times daily. 17 grams in 6 oz of favorite drink twice a day until bowel movement.  LAXITIVE.  Restart if two days since last bowel movement What changed:  when to take this reasons to take this additional instructions   PROBIOTIC PO Take 1 capsule by mouth every morning.   SSD 1 % cream Generic drug: silver sulfADIAZINE Apply 1 Application topically daily.   SYSTANE OP Place 2 drops into both eyes daily as needed (for dry eyes).   tiZANidine 2 MG tablet Commonly known as: ZANAFLEX Take 2 tablets (4 mg total) by mouth every 8 (eight) hours as needed for muscle  spasms.   topiramate 50 MG tablet Commonly known as: Topamax Take 1 tablet (50 mg total) by mouth at bedtime. X 1 week, then 75 mg nightly x 1 week, ,then 100 mg at bedtime- for prevention of headaches        Follow-up Information     Schedule an appointment as soon as possible for a visit  with Noberto Retort, MD.   Specialty: Family Medicine Contact information: [address] [place] [address] [phone]                Discharge Exam: Filed Weights   03/03/23 2047 03/09/23 1514 03/10/23 0700  Weight: 44.9 kg 44.4 kg 44.2 kg   GEN: NAD SKIN: Warm and dry EYES: Anicteric ENT: MMM CV: RRR PULM: CTA B ABD: soft, ND, NT, +BS CNS: AAO x 3, non focal EXT: No edema or tenderness   Condition at discharge: good  The results of significant diagnostics from this hospitalization (including imaging, microbiology, ancillary and laboratory) are listed below for reference.   Imaging Studies: DG Abd 1 View Result Date: 03/07/2023 CLINICAL DATA:  Ileus EXAM: ABDOMEN - 1 VIEW COMPARISON:  03/06/2023 FINDINGS: Persisting gaseous distension of both large and small bowel loops throughout the abdomen. Small bowel dilation of a loop within the left hemiabdomen measuring up to 4.7 cm in diameter, similar to prior. No gross free intraperitoneal air on supine imaging. IMPRESSION: Persisting gaseous distension of both large and small bowel loops throughout the abdomen, similar to prior. Electronically Signed   By: Duanne Guess D.O.   On: 03/07/2023 09:09   DG Abd 1 View Result Date: 03/06/2023 CLINICAL DATA:  [id number] SBO (small bowel obstruction) (HCC) [id number] EXAM: ABDOMEN - 1 VIEW COMPARISON:  03/05/2023 FINDINGS: Persistent gaseous distension of large and small bowel loops throughout the abdomen. Gaseous distension of the rectum. No gross free intraperitoneal air on supine imaging. IMPRESSION: Persistent gaseous distension of large and small bowel loops throughout  the abdomen, compatible with ileus or obstruction. Electronically Signed   By: Duanne Guess D.O.   On: 03/06/2023 11:46   DG Abd 1 View Result Date: 03/05/2023 CLINICAL DATA:  78 year old female with abdominal distension. Subtotal colectomy, distal fecal impaction versus obstruction on CT yesterday. EXAM: ABDOMEN - 1 VIEW COMPARISON:  CT Abdomen and Pelvis yesterday, and earlier. FINDINGS: Portable AP supine views at 0530 hours. Chronic thoracic spinal stimulator device, multilevel lumbar decompression and fusion. Stable visualized osseous structures. Stable cholecystectomy clips. Stable lung bases. Unchanged bowel gas pattern from the CT yesterday. Continued vague increased density in the pelvis which yesterday was more related to dilated distal  colon than the urinary bladder. IMPRESSION: Gas pattern unchanged from the CT yesterday. Suspect ongoing impaction/ileus/obstruction as described on that exam. Electronically Signed   By: Odessa Fleming M.D.   On: 03/05/2023 06:42   CT ABDOMEN PELVIS WO CONTRAST Result Date: 03/04/2023 CLINICAL DATA:  Bowel obstruction. Nausea and diarrhea for 3 days. Vomiting. Abdominal cramping. EXAM: CT ABDOMEN AND PELVIS WITHOUT CONTRAST TECHNIQUE: Multidetector CT imaging of the abdomen and pelvis was performed following the standard protocol without IV contrast. RADIATION DOSE REDUCTION: This exam was performed according to the departmental dose-optimization program which includes automated exposure control, adjustment of the mA and/or kV according to patient size and/or use of iterative reconstruction technique. COMPARISON:  12/28/2022 FINDINGS: Lower chest: No acute findings. Hepatobiliary: No suspicious focal abnormality in the liver on this study without intravenous contrast. Gallbladder is surgically absent. Stable common bile duct measures 9 mm similar to minimally decreased from 10 mm previously. Pancreas: Mild prominence of the main pancreatic duct in the head of the  pancreas is stable in the interval. Spleen: No splenomegaly. No suspicious focal mass lesion. Adrenals/Urinary Tract: No adrenal nodule or mass. Stable small nonobstructing stone lower pole right kidney. Left kidney unremarkable. No evidence for hydroureter. Posterior left bladder wall diverticulum again noted. Bladder otherwise unremarkable. Stomach/Bowel: Stomach is distended and filled with fluid. Duodenum is fluid-filled and distended. Proximal small bowel in the left abdomen is fluid-filled and distended up to about 3.5 cm diameter. Small bowel in the right abdomen is less distended. Patient is status post subtotal colectomy in the ileocolic anastomosis and rectum are markedly dilated and filled with stool and fluid. Rectum measures 8.3 cm diameter. Vascular/Lymphatic: There is advanced atherosclerotic calcification of the abdominal aorta without aneurysm. There is no gastrohepatic or hepatoduodenal ligament lymphadenopathy. No retroperitoneal or mesenteric lymphadenopathy. No pelvic sidewall lymphadenopathy. Reproductive: There is no adnexal mass. Other: No intraperitoneal free fluid. Musculoskeletal: No worrisome lytic or sclerotic osseous abnormality. IMPRESSION: 1. Status post subtotal colectomy. The enterocolic anastomosis and rectum are markedly dilated and filled with stool and fluid. Rectum measures 8.3 cm diameter. Stomach and proximal small bowel are fluid-filled with proximal small bowel distended up to about 3.5 cm diameter. Other small bowel loops are less distended. No overt small bowel transition zone can be identified. Given the large volume of fluid in stool in the rectum, features may reflect underlying component of fecal impaction. Severe ileus or rectal stricture would also be considerations. 2. Stable small nonobstructing stone lower pole right kidney. 3. Similar biliary dilatation status post cholecystectomy. Correlation with liver function test recommended. 4. Stable mild prominence of  the main pancreatic duct in the head of the pancreas. 5.  Aortic Atherosclerosis (ICD10-I70.0). Electronically Signed   By: Kennith Center M.D.   On: 03/04/2023 06:19    Microbiology: Results for orders placed or performed during the hospital encounter of 03/04/23  Gastrointestinal Panel by PCR , Stool     Status: None   Collection Time: 03/04/23  8:09 AM   Specimen: Stool  Result Value Ref Range Status   Campylobacter species NOT DETECTED NOT DETECTED Final   Plesimonas shigelloides NOT DETECTED NOT DETECTED Final   Salmonella species NOT DETECTED NOT DETECTED Final   Yersinia enterocolitica NOT DETECTED NOT DETECTED Final   Vibrio species NOT DETECTED NOT DETECTED Final   Vibrio cholerae NOT DETECTED NOT DETECTED Final   Enteroaggregative E coli (EAEC) NOT DETECTED NOT DETECTED Final   Enteropathogenic E coli (EPEC) NOT DETECTED NOT DETECTED Final  Enterotoxigenic E coli (ETEC) NOT DETECTED NOT DETECTED Final   Shiga like toxin producing E coli (STEC) NOT DETECTED NOT DETECTED Final   Shigella/Enteroinvasive E coli (EIEC) NOT DETECTED NOT DETECTED Final   Cryptosporidium NOT DETECTED NOT DETECTED Final   Cyclospora cayetanensis NOT DETECTED NOT DETECTED Final   Entamoeba histolytica NOT DETECTED NOT DETECTED Final   Giardia lamblia NOT DETECTED NOT DETECTED Final   Adenovirus F40/41 NOT DETECTED NOT DETECTED Final   Astrovirus NOT DETECTED NOT DETECTED Final   Norovirus GI/GII NOT DETECTED NOT DETECTED Final   Rotavirus A NOT DETECTED NOT DETECTED Final   Sapovirus (I, II, IV, and V) NOT DETECTED NOT DETECTED Final    Comment: Performed at [facility], [address] [place], [place] [address]  C Difficile Quick Screen w PCR reflex     Status: None   Collection Time: 03/04/23  8:09 AM   Specimen: Stool  Result Value Ref Range Status   C Diff antigen NEGATIVE NEGATIVE Final   C Diff toxin NEGATIVE NEGATIVE Final   C Diff interpretation No C. difficile detected.  Final     Comment: Performed at [facility], [address] [place], [place] [address]    Labs: CBC: Recent Labs  Lab 03/05/23 0525 03/06/23 0558 03/08/23 0551  WBC 5.5 8.2 8.5  HGB 8.9* 9.0* 9.9*  HCT 27.8* 27.3* 29.8*  MCV 98.9 93.2 95.2  PLT 184 185 182   Basic Metabolic Panel: Recent Labs  Lab 03/06/23 0558 03/07/23 0529 03/08/23 0551 03/09/23 0635 03/10/23 0539 03/10/23 1742  NA 142 142 139 136 139  --   K 3.1* 3.2* 3.9 3.1* 3.2* 5.5*  CL 109 114* 113* 108 108  --   CO2 24 22 19* 20* 22  --   GLUCOSE 144* 124* 100* 93 88  --   BUN 27* 18 14 13 13   --   CREATININE 0.89 0.86 0.63 0.77 0.78  --   CALCIUM 9.0 9.0 8.8* 8.5* 8.3*  --   MG 1.8 1.7 2.0 1.8 2.3  --   PHOS <1.0* 1.7* 1.4* 1.8* 1.7* 5.3*   Liver Function Tests: Recent Labs  Lab 03/10/23 0539  ALBUMIN 3.4*   CBG: Recent Labs  Lab 03/09/23 0800 03/09/23 1126 03/09/23 1712 03/09/23 2046 03/10/23 1634  GLUCAP 104* 92 71 141* 108*    Discharge time spent: greater than 30 minutes.  Signed: Lurene Shadow, MD [facility] 03/11/2023

## 2023-03-12 LAB — ZINC: Zinc: 51 ug/dL (ref 44–115)

## 2023-03-16 ENCOUNTER — Encounter (HOSPITAL_BASED_OUTPATIENT_CLINIC_OR_DEPARTMENT_OTHER): Payer: PPO | Attending: Internal Medicine | Admitting: Internal Medicine

## 2023-03-16 DIAGNOSIS — L97812 Non-pressure chronic ulcer of other part of right lower leg with fat layer exposed: Secondary | ICD-10-CM

## 2023-03-16 DIAGNOSIS — S51001A Unspecified open wound of right elbow, initial encounter: Secondary | ICD-10-CM | POA: Diagnosis not present

## 2023-03-16 DIAGNOSIS — S81802A Unspecified open wound, left lower leg, initial encounter: Secondary | ICD-10-CM | POA: Insufficient documentation

## 2023-03-16 DIAGNOSIS — I87311 Chronic venous hypertension (idiopathic) with ulcer of right lower extremity: Secondary | ICD-10-CM

## 2023-03-16 DIAGNOSIS — T798XXA Other early complications of trauma, initial encounter: Secondary | ICD-10-CM | POA: Diagnosis not present

## 2023-03-16 DIAGNOSIS — G35 Multiple sclerosis: Secondary | ICD-10-CM | POA: Insufficient documentation

## 2023-03-16 DIAGNOSIS — X58XXXA Exposure to other specified factors, initial encounter: Secondary | ICD-10-CM | POA: Diagnosis not present

## 2023-03-20 ENCOUNTER — Encounter: Payer: Self-pay | Admitting: Physical Medicine and Rehabilitation

## 2023-03-20 ENCOUNTER — Encounter: Payer: PPO | Attending: Physical Medicine and Rehabilitation | Admitting: Physical Medicine and Rehabilitation

## 2023-03-20 VITALS — BP 121/72 | HR 72 | Ht 60.0 in | Wt 96.0 lb

## 2023-03-20 DIAGNOSIS — B370 Candidal stomatitis: Secondary | ICD-10-CM | POA: Insufficient documentation

## 2023-03-20 DIAGNOSIS — Z981 Arthrodesis status: Secondary | ICD-10-CM | POA: Insufficient documentation

## 2023-03-20 DIAGNOSIS — R269 Unspecified abnormalities of gait and mobility: Secondary | ICD-10-CM | POA: Insufficient documentation

## 2023-03-20 DIAGNOSIS — G35 Multiple sclerosis: Secondary | ICD-10-CM | POA: Insufficient documentation

## 2023-03-20 DIAGNOSIS — M7918 Myalgia, other site: Secondary | ICD-10-CM | POA: Insufficient documentation

## 2023-03-20 MED ORDER — LIDOCAINE HCL 1 % IJ SOLN
3.0000 mL | Freq: Once | INTRAMUSCULAR | Status: AC
  Administered 2023-03-20: 3 mL

## 2023-03-20 MED ORDER — FLUCONAZOLE 100 MG PO TABS: ORAL_TABLET | ORAL | 0 refills | Status: AC

## 2023-03-20 NOTE — Progress Notes (Signed)
  Pt is a 78 yr old female with hx of L3 fx with L2-L4 fixation and removal of prior hardware- recent fusion prior to L3 fx; with hx of relapsing remitting MS; Migraines on CGRP injections and Nurtec; Hypotension; IBS and Neurogenic bladder due to MS as well as Addison's disease on medrol  daily. Did trigger point injections in hospital for pain and vertigo- surgery done in 08/2022.   Patient here for f/u on back L3 fx and trp injections.       Was in hospital in late January-  N/V/D and abd pain.  Was impacted stool wise. And dehydrated, and had a GI bug.  Had ileus.    Not sure what month it is right now.  No appetite and no taste buds.  Every time eats, takes just a few bites.  Said they also didn't give her meds for Addison's in hospital.   Now has double vision as well- HA when came out of hospital- hasn't called Neuro about double vision.  Not comprehending what she's reading.  Cannot think of specific words and cannot spell anymore.   Marvell Slider and hit head in July- 2024- but just wondering why declining   Also put on Zoloft.   Neck and shoulders really tight- so wants injections today.  Gets injections in back in 1 week.    Exam: Awake, alert, but says doesn't remember month Has thrush Tangential, and perseverative on exam- hard to keep on topic I see her for- keeps going back to Neuro issues-    Plan:  Needs to call Neurology about  about reading comprehension, word finding issues;  and double vision.   2. Will give Diflucan  for thrush that's on exam- 200 mg x1 and then 100 mg daily x 6 days.    3 Patient here for trigger point injections for  Consent done and on chart.  Cleaned areas with alcohol and injected using a 27 gauge 1.5 inch needle  Injected 3cc- none wasted Using 1% Lidocaine  with no EPI  Upper traps B/L x3 Levators- B/L  Posterior scalenes Middle scalenes B/L x2 Splenius Capitus Pectoralis Major Rhomboids B/L  Infraspinatus Teres  Major/minor Thoracic paraspinals Lumbar paraspinals Other injections-    Patient's level of pain prior was 10+/10 Current level of pain after injections is- now 0-1/10- so much better  There was no bleeding or complications.  Patient was advised to drink a lot of water on day after injections to flush system Will have increased soreness for 12-48 hours after injections.  Can use Lidocaine  patches the day AFTER injections Can use theracane on day of injections in places didn't inject Can use heating pad 4-6 hours AFTER injections  4. Reviewed last MRI 12/28/22- on brain- nothing acute, but hasn't been checked in last 3 months. I suggest calling Neuro for new MRI of brain.    5. Cannot take pain meds anymore secondary to constipation-   Getting injection next week for back.    6.  F/U in 6 weeks- f/u on TrP injections and f/u on pain and MS   I spent a total of  36  minutes on total care today- >50% coordination of care- due to 6 minutes o injections- rest d/w pt her neurological Sx's and concerns and appetite/thing tasting wrong- d'cd with thrush.

## 2023-03-20 NOTE — Patient Instructions (Signed)
 Plan:  Needs to call Neurology about  about reading comprehension, word finding issues;  and double vision.   2. Will give Diflucan  for thrush that's on exam- 200 mg x1 and then 100 mg daily x 6 days.    3 Patient here for trigger point injections for  Consent done and on chart.  Cleaned areas with alcohol and injected using a 27 gauge 1.5 inch needle  Injected 3cc- none wasted Using 1% Lidocaine  with no EPI  Upper traps B/L x3 Levators- B/L  Posterior scalenes Middle scalenes B/L x2 Splenius Capitus Pectoralis Major Rhomboids B/L  Infraspinatus Teres Major/minor Thoracic paraspinals Lumbar paraspinals Other injections-    Patient's level of pain prior was 10+/10 Current level of pain after injections is- now 0-1/10- so much better  There was no bleeding or complications.  Patient was advised to drink a lot of water on day after injections to flush system Will have increased soreness for 12-48 hours after injections.  Can use Lidocaine  patches the day AFTER injections Can use theracane on day of injections in places didn't inject Can use heating pad 4-6 hours AFTER injections  4. Reviewed last MRI 12/28/22- on brain- nothing acute, but hasn't been checked in last 3 months. I suggest calling Neuro for new MRI of brain.    5. Cannot take pain meds anymore secondary to constipation-   Getting injection next week for back.    6.  F/U in 6 weeks- f/u on TrP injections and f/u on pain and MS

## 2023-03-21 ENCOUNTER — Encounter (HOSPITAL_BASED_OUTPATIENT_CLINIC_OR_DEPARTMENT_OTHER): Payer: PPO | Admitting: Internal Medicine

## 2023-03-21 DIAGNOSIS — S51001A Unspecified open wound of right elbow, initial encounter: Secondary | ICD-10-CM | POA: Diagnosis not present

## 2023-03-21 DIAGNOSIS — S81802A Unspecified open wound, left lower leg, initial encounter: Secondary | ICD-10-CM

## 2023-03-21 DIAGNOSIS — L97812 Non-pressure chronic ulcer of other part of right lower leg with fat layer exposed: Secondary | ICD-10-CM

## 2023-03-21 DIAGNOSIS — I87311 Chronic venous hypertension (idiopathic) with ulcer of right lower extremity: Secondary | ICD-10-CM | POA: Diagnosis not present

## 2023-03-24 DIAGNOSIS — N179 Acute kidney failure, unspecified: Secondary | ICD-10-CM | POA: Diagnosis not present

## 2023-03-24 DIAGNOSIS — G629 Polyneuropathy, unspecified: Secondary | ICD-10-CM | POA: Diagnosis not present

## 2023-03-24 DIAGNOSIS — K5641 Fecal impaction: Secondary | ICD-10-CM | POA: Diagnosis not present

## 2023-03-24 DIAGNOSIS — K56699 Other intestinal obstruction unspecified as to partial versus complete obstruction: Secondary | ICD-10-CM | POA: Diagnosis not present

## 2023-03-24 DIAGNOSIS — E43 Unspecified severe protein-calorie malnutrition: Secondary | ICD-10-CM | POA: Diagnosis not present

## 2023-03-24 DIAGNOSIS — R296 Repeated falls: Secondary | ICD-10-CM | POA: Diagnosis not present

## 2023-03-24 DIAGNOSIS — M797 Fibromyalgia: Secondary | ICD-10-CM | POA: Diagnosis not present

## 2023-03-24 DIAGNOSIS — M1990 Unspecified osteoarthritis, unspecified site: Secondary | ICD-10-CM | POA: Diagnosis not present

## 2023-03-24 DIAGNOSIS — S51001D Unspecified open wound of right elbow, subsequent encounter: Secondary | ICD-10-CM | POA: Diagnosis not present

## 2023-03-24 DIAGNOSIS — I10 Essential (primary) hypertension: Secondary | ICD-10-CM | POA: Diagnosis not present

## 2023-03-24 DIAGNOSIS — Z792 Long term (current) use of antibiotics: Secondary | ICD-10-CM | POA: Diagnosis not present

## 2023-03-24 DIAGNOSIS — I951 Orthostatic hypotension: Secondary | ICD-10-CM | POA: Diagnosis not present

## 2023-03-24 DIAGNOSIS — M549 Dorsalgia, unspecified: Secondary | ICD-10-CM | POA: Diagnosis not present

## 2023-03-24 DIAGNOSIS — F419 Anxiety disorder, unspecified: Secondary | ICD-10-CM | POA: Diagnosis not present

## 2023-03-24 DIAGNOSIS — Z556 Problems related to health literacy: Secondary | ICD-10-CM | POA: Diagnosis not present

## 2023-03-24 DIAGNOSIS — Z9049 Acquired absence of other specified parts of digestive tract: Secondary | ICD-10-CM | POA: Diagnosis not present

## 2023-03-24 DIAGNOSIS — K589 Irritable bowel syndrome without diarrhea: Secondary | ICD-10-CM | POA: Diagnosis not present

## 2023-03-24 DIAGNOSIS — E271 Primary adrenocortical insufficiency: Secondary | ICD-10-CM | POA: Diagnosis not present

## 2023-03-24 DIAGNOSIS — Z85038 Personal history of other malignant neoplasm of large intestine: Secondary | ICD-10-CM | POA: Diagnosis not present

## 2023-03-24 DIAGNOSIS — G8929 Other chronic pain: Secondary | ICD-10-CM | POA: Diagnosis not present

## 2023-03-24 DIAGNOSIS — G43709 Chronic migraine without aura, not intractable, without status migrainosus: Secondary | ICD-10-CM | POA: Diagnosis not present

## 2023-03-24 DIAGNOSIS — G2581 Restless legs syndrome: Secondary | ICD-10-CM | POA: Diagnosis not present

## 2023-03-24 DIAGNOSIS — Z7952 Long term (current) use of systemic steroids: Secondary | ICD-10-CM | POA: Diagnosis not present

## 2023-03-24 DIAGNOSIS — F32A Depression, unspecified: Secondary | ICD-10-CM | POA: Diagnosis not present

## 2023-03-24 DIAGNOSIS — G35 Multiple sclerosis: Secondary | ICD-10-CM | POA: Diagnosis not present

## 2023-03-24 DIAGNOSIS — E876 Hypokalemia: Secondary | ICD-10-CM | POA: Diagnosis not present

## 2023-03-24 DIAGNOSIS — L89891 Pressure ulcer of other site, stage 1: Secondary | ICD-10-CM | POA: Diagnosis not present

## 2023-03-24 DIAGNOSIS — Z9181 History of falling: Secondary | ICD-10-CM | POA: Diagnosis not present

## 2023-03-28 ENCOUNTER — Encounter (HOSPITAL_BASED_OUTPATIENT_CLINIC_OR_DEPARTMENT_OTHER): Payer: PPO | Admitting: Internal Medicine

## 2023-03-28 DIAGNOSIS — T798XXA Other early complications of trauma, initial encounter: Secondary | ICD-10-CM | POA: Diagnosis not present

## 2023-03-28 DIAGNOSIS — I87312 Chronic venous hypertension (idiopathic) with ulcer of left lower extremity: Secondary | ICD-10-CM

## 2023-03-28 DIAGNOSIS — S81802A Unspecified open wound, left lower leg, initial encounter: Secondary | ICD-10-CM

## 2023-03-28 DIAGNOSIS — L97812 Non-pressure chronic ulcer of other part of right lower leg with fat layer exposed: Secondary | ICD-10-CM

## 2023-03-28 DIAGNOSIS — I87311 Chronic venous hypertension (idiopathic) with ulcer of right lower extremity: Secondary | ICD-10-CM

## 2023-03-29 ENCOUNTER — Ambulatory Visit: Payer: PPO | Admitting: Physical Medicine and Rehabilitation

## 2023-04-04 ENCOUNTER — Encounter (HOSPITAL_BASED_OUTPATIENT_CLINIC_OR_DEPARTMENT_OTHER): Payer: PPO | Admitting: Internal Medicine

## 2023-04-04 DIAGNOSIS — I87311 Chronic venous hypertension (idiopathic) with ulcer of right lower extremity: Secondary | ICD-10-CM | POA: Diagnosis not present

## 2023-04-04 DIAGNOSIS — I87312 Chronic venous hypertension (idiopathic) with ulcer of left lower extremity: Secondary | ICD-10-CM | POA: Diagnosis not present

## 2023-04-04 DIAGNOSIS — L97812 Non-pressure chronic ulcer of other part of right lower leg with fat layer exposed: Secondary | ICD-10-CM | POA: Diagnosis not present

## 2023-04-04 DIAGNOSIS — S81802A Unspecified open wound, left lower leg, initial encounter: Secondary | ICD-10-CM | POA: Diagnosis not present

## 2023-04-11 ENCOUNTER — Encounter (HOSPITAL_BASED_OUTPATIENT_CLINIC_OR_DEPARTMENT_OTHER): Payer: PPO | Attending: Internal Medicine | Admitting: General Surgery

## 2023-04-11 DIAGNOSIS — I87313 Chronic venous hypertension (idiopathic) with ulcer of bilateral lower extremity: Secondary | ICD-10-CM | POA: Diagnosis not present

## 2023-04-11 DIAGNOSIS — L97812 Non-pressure chronic ulcer of other part of right lower leg with fat layer exposed: Secondary | ICD-10-CM | POA: Insufficient documentation

## 2023-04-11 DIAGNOSIS — L97822 Non-pressure chronic ulcer of other part of left lower leg with fat layer exposed: Secondary | ICD-10-CM | POA: Diagnosis not present

## 2023-04-11 DIAGNOSIS — S51001D Unspecified open wound of right elbow, subsequent encounter: Secondary | ICD-10-CM | POA: Diagnosis not present

## 2023-04-11 DIAGNOSIS — S51011A Laceration without foreign body of right elbow, initial encounter: Secondary | ICD-10-CM | POA: Diagnosis not present

## 2023-04-11 DIAGNOSIS — G35 Multiple sclerosis: Secondary | ICD-10-CM | POA: Diagnosis not present

## 2023-04-11 DIAGNOSIS — S81801A Unspecified open wound, right lower leg, initial encounter: Secondary | ICD-10-CM | POA: Diagnosis not present

## 2023-04-11 DIAGNOSIS — S81812A Laceration without foreign body, left lower leg, initial encounter: Secondary | ICD-10-CM | POA: Diagnosis not present

## 2023-04-14 ENCOUNTER — Encounter (HOSPITAL_BASED_OUTPATIENT_CLINIC_OR_DEPARTMENT_OTHER): Admitting: Internal Medicine

## 2023-04-14 DIAGNOSIS — S51001D Unspecified open wound of right elbow, subsequent encounter: Secondary | ICD-10-CM

## 2023-04-14 DIAGNOSIS — I87313 Chronic venous hypertension (idiopathic) with ulcer of bilateral lower extremity: Secondary | ICD-10-CM | POA: Diagnosis not present

## 2023-04-14 DIAGNOSIS — S81802D Unspecified open wound, left lower leg, subsequent encounter: Secondary | ICD-10-CM

## 2023-04-18 ENCOUNTER — Encounter (HOSPITAL_BASED_OUTPATIENT_CLINIC_OR_DEPARTMENT_OTHER): Payer: PPO | Admitting: Internal Medicine

## 2023-04-18 DIAGNOSIS — I87312 Chronic venous hypertension (idiopathic) with ulcer of left lower extremity: Secondary | ICD-10-CM

## 2023-04-18 DIAGNOSIS — L98492 Non-pressure chronic ulcer of skin of other sites with fat layer exposed: Secondary | ICD-10-CM | POA: Diagnosis not present

## 2023-04-18 DIAGNOSIS — S51001D Unspecified open wound of right elbow, subsequent encounter: Secondary | ICD-10-CM | POA: Diagnosis not present

## 2023-04-18 DIAGNOSIS — L97822 Non-pressure chronic ulcer of other part of left lower leg with fat layer exposed: Secondary | ICD-10-CM | POA: Diagnosis not present

## 2023-04-18 DIAGNOSIS — I87313 Chronic venous hypertension (idiopathic) with ulcer of bilateral lower extremity: Secondary | ICD-10-CM | POA: Diagnosis not present

## 2023-04-24 ENCOUNTER — Encounter (HOSPITAL_BASED_OUTPATIENT_CLINIC_OR_DEPARTMENT_OTHER): Admitting: Internal Medicine

## 2023-04-24 DIAGNOSIS — S51001D Unspecified open wound of right elbow, subsequent encounter: Secondary | ICD-10-CM | POA: Diagnosis not present

## 2023-04-24 DIAGNOSIS — L97822 Non-pressure chronic ulcer of other part of left lower leg with fat layer exposed: Secondary | ICD-10-CM | POA: Diagnosis not present

## 2023-04-24 DIAGNOSIS — I87311 Chronic venous hypertension (idiopathic) with ulcer of right lower extremity: Secondary | ICD-10-CM

## 2023-04-24 DIAGNOSIS — I87312 Chronic venous hypertension (idiopathic) with ulcer of left lower extremity: Secondary | ICD-10-CM | POA: Diagnosis not present

## 2023-04-24 DIAGNOSIS — I87313 Chronic venous hypertension (idiopathic) with ulcer of bilateral lower extremity: Secondary | ICD-10-CM | POA: Diagnosis not present

## 2023-04-24 DIAGNOSIS — S2231XD Fracture of one rib, right side, subsequent encounter for fracture with routine healing: Secondary | ICD-10-CM | POA: Diagnosis not present

## 2023-04-25 DIAGNOSIS — M5416 Radiculopathy, lumbar region: Secondary | ICD-10-CM | POA: Diagnosis not present

## 2023-04-26 DIAGNOSIS — S81802D Unspecified open wound, left lower leg, subsequent encounter: Secondary | ICD-10-CM | POA: Diagnosis not present

## 2023-04-26 DIAGNOSIS — S51001D Unspecified open wound of right elbow, subsequent encounter: Secondary | ICD-10-CM | POA: Diagnosis not present

## 2023-04-26 DIAGNOSIS — L97822 Non-pressure chronic ulcer of other part of left lower leg with fat layer exposed: Secondary | ICD-10-CM | POA: Diagnosis not present

## 2023-04-26 DIAGNOSIS — L98492 Non-pressure chronic ulcer of skin of other sites with fat layer exposed: Secondary | ICD-10-CM | POA: Diagnosis not present

## 2023-04-27 DIAGNOSIS — L97822 Non-pressure chronic ulcer of other part of left lower leg with fat layer exposed: Secondary | ICD-10-CM | POA: Diagnosis not present

## 2023-05-01 ENCOUNTER — Encounter: Payer: PPO | Admitting: Physical Medicine and Rehabilitation

## 2023-05-01 ENCOUNTER — Ambulatory Visit (HOSPITAL_BASED_OUTPATIENT_CLINIC_OR_DEPARTMENT_OTHER): Admitting: Internal Medicine

## 2023-05-01 DIAGNOSIS — N183 Chronic kidney disease, stage 3 unspecified: Secondary | ICD-10-CM | POA: Diagnosis not present

## 2023-05-01 DIAGNOSIS — E2740 Unspecified adrenocortical insufficiency: Secondary | ICD-10-CM | POA: Diagnosis not present

## 2023-05-01 DIAGNOSIS — K219 Gastro-esophageal reflux disease without esophagitis: Secondary | ICD-10-CM | POA: Diagnosis not present

## 2023-05-01 DIAGNOSIS — M797 Fibromyalgia: Secondary | ICD-10-CM | POA: Diagnosis not present

## 2023-05-01 DIAGNOSIS — D508 Other iron deficiency anemias: Secondary | ICD-10-CM | POA: Diagnosis not present

## 2023-05-01 DIAGNOSIS — I808 Phlebitis and thrombophlebitis of other sites: Secondary | ICD-10-CM | POA: Diagnosis not present

## 2023-05-01 DIAGNOSIS — R110 Nausea: Secondary | ICD-10-CM | POA: Diagnosis not present

## 2023-05-01 DIAGNOSIS — K5909 Other constipation: Secondary | ICD-10-CM | POA: Diagnosis not present

## 2023-05-01 DIAGNOSIS — I10 Essential (primary) hypertension: Secondary | ICD-10-CM | POA: Diagnosis not present

## 2023-05-01 DIAGNOSIS — F339 Major depressive disorder, recurrent, unspecified: Secondary | ICD-10-CM | POA: Diagnosis not present

## 2023-05-01 DIAGNOSIS — F419 Anxiety disorder, unspecified: Secondary | ICD-10-CM | POA: Diagnosis not present

## 2023-05-01 DIAGNOSIS — F5101 Primary insomnia: Secondary | ICD-10-CM | POA: Diagnosis not present

## 2023-05-02 ENCOUNTER — Encounter (HOSPITAL_BASED_OUTPATIENT_CLINIC_OR_DEPARTMENT_OTHER): Admitting: Internal Medicine

## 2023-05-02 DIAGNOSIS — I87313 Chronic venous hypertension (idiopathic) with ulcer of bilateral lower extremity: Secondary | ICD-10-CM | POA: Diagnosis not present

## 2023-05-02 DIAGNOSIS — I87312 Chronic venous hypertension (idiopathic) with ulcer of left lower extremity: Secondary | ICD-10-CM

## 2023-05-02 DIAGNOSIS — L97822 Non-pressure chronic ulcer of other part of left lower leg with fat layer exposed: Secondary | ICD-10-CM | POA: Diagnosis not present

## 2023-05-02 DIAGNOSIS — L97812 Non-pressure chronic ulcer of other part of right lower leg with fat layer exposed: Secondary | ICD-10-CM

## 2023-05-02 DIAGNOSIS — S51001D Unspecified open wound of right elbow, subsequent encounter: Secondary | ICD-10-CM | POA: Diagnosis not present

## 2023-05-04 DIAGNOSIS — L89891 Pressure ulcer of other site, stage 1: Secondary | ICD-10-CM | POA: Diagnosis not present

## 2023-05-08 ENCOUNTER — Encounter (HOSPITAL_BASED_OUTPATIENT_CLINIC_OR_DEPARTMENT_OTHER): Admitting: Internal Medicine

## 2023-05-08 DIAGNOSIS — L97822 Non-pressure chronic ulcer of other part of left lower leg with fat layer exposed: Secondary | ICD-10-CM | POA: Diagnosis not present

## 2023-05-08 DIAGNOSIS — L97812 Non-pressure chronic ulcer of other part of right lower leg with fat layer exposed: Secondary | ICD-10-CM | POA: Diagnosis not present

## 2023-05-08 DIAGNOSIS — I87312 Chronic venous hypertension (idiopathic) with ulcer of left lower extremity: Secondary | ICD-10-CM

## 2023-05-08 DIAGNOSIS — I87313 Chronic venous hypertension (idiopathic) with ulcer of bilateral lower extremity: Secondary | ICD-10-CM | POA: Diagnosis not present

## 2023-05-08 DIAGNOSIS — I87311 Chronic venous hypertension (idiopathic) with ulcer of right lower extremity: Secondary | ICD-10-CM

## 2023-05-08 DIAGNOSIS — S51001D Unspecified open wound of right elbow, subsequent encounter: Secondary | ICD-10-CM | POA: Diagnosis not present

## 2023-05-09 ENCOUNTER — Ambulatory Visit (HOSPITAL_BASED_OUTPATIENT_CLINIC_OR_DEPARTMENT_OTHER): Admitting: Internal Medicine

## 2023-05-12 ENCOUNTER — Other Ambulatory Visit

## 2023-05-15 ENCOUNTER — Encounter: Payer: Self-pay | Admitting: Endocrinology

## 2023-05-15 ENCOUNTER — Encounter (HOSPITAL_BASED_OUTPATIENT_CLINIC_OR_DEPARTMENT_OTHER): Attending: Internal Medicine | Admitting: Internal Medicine

## 2023-05-15 ENCOUNTER — Ambulatory Visit: Admitting: Endocrinology

## 2023-05-15 VITALS — BP 102/50 | HR 97 | Resp 12 | Ht 60.0 in | Wt 83.8 lb

## 2023-05-15 DIAGNOSIS — L97812 Non-pressure chronic ulcer of other part of right lower leg with fat layer exposed: Secondary | ICD-10-CM | POA: Insufficient documentation

## 2023-05-15 DIAGNOSIS — S51001D Unspecified open wound of right elbow, subsequent encounter: Secondary | ICD-10-CM | POA: Diagnosis not present

## 2023-05-15 DIAGNOSIS — L97822 Non-pressure chronic ulcer of other part of left lower leg with fat layer exposed: Secondary | ICD-10-CM | POA: Insufficient documentation

## 2023-05-15 DIAGNOSIS — I87313 Chronic venous hypertension (idiopathic) with ulcer of bilateral lower extremity: Secondary | ICD-10-CM | POA: Diagnosis not present

## 2023-05-15 DIAGNOSIS — R634 Abnormal weight loss: Secondary | ICD-10-CM

## 2023-05-15 DIAGNOSIS — G35 Multiple sclerosis: Secondary | ICD-10-CM | POA: Diagnosis not present

## 2023-05-15 DIAGNOSIS — I951 Orthostatic hypotension: Secondary | ICD-10-CM

## 2023-05-15 DIAGNOSIS — E2749 Other adrenocortical insufficiency: Secondary | ICD-10-CM

## 2023-05-15 DIAGNOSIS — E673 Hypervitaminosis D: Secondary | ICD-10-CM

## 2023-05-15 DIAGNOSIS — I87312 Chronic venous hypertension (idiopathic) with ulcer of left lower extremity: Secondary | ICD-10-CM | POA: Insufficient documentation

## 2023-05-15 DIAGNOSIS — X58XXXD Exposure to other specified factors, subsequent encounter: Secondary | ICD-10-CM | POA: Diagnosis not present

## 2023-05-15 DIAGNOSIS — T798XXD Other early complications of trauma, subsequent encounter: Secondary | ICD-10-CM | POA: Diagnosis not present

## 2023-05-15 NOTE — Patient Instructions (Signed)
 Continue methylprednisolone 4 mg in the morning and 2 mg in the afternoon.  You may take the double the usual dose when you get ill or not feeling good for example nausea persistently, for 3 to 5 days and go back to the original dose.  I am going to check lab for thyroid levels, sodium, potassium and calcium today.

## 2023-05-15 NOTE — Progress Notes (Unsigned)
 Outpatient Endocrinology Note [place] Memory Heinrichs, MD  05/17/23  Patient's Name: Katrina Rivera    DOB: 1945-12-30    MRN: [id number]  REASON OF VISIT: Follow up of  adrenal insufficiency / dysautonomic orthostatic hypotension/hypercalcemia.  PCP: Noberto Retort, MD  HISTORY OF PRESENT ILLNESS:   Katrina Rivera is a 78 y.o. old female with past medical history listed below, is here for follow up of  adrenal insufficiency / dysautonomic orthostatic hypotension / hypercalcemia..   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  He was diagnosed with dysautonomia and had multiple other problems related to autonomic neuropathy.  She has been on Florinef since 2004, previously she had taken 3 tablets daily along with potassium supplement.  She had tried midodrine in the past in 2014 and this was later stopped when blood pressure increased.  Florinef was adjusted multiple times in the past for regulating her blood pressure.  Most recently she has been taking Florinef 0.05 mg daily.  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  Prior testing included cosyntropin stimulation test showing a stimulated cortisol level of 15.7 and baseline cortisol 3.9.  Confirmed by 24-hour urine free cortisol which was only 3.0.  She was symptomatic with weight loss, decreased appetite, nausea and diarrhea.  Cosyntropin stimulation test in February 2016 showed baseline cortisol level of 0.3 and post injection of 1.1 only. She had GI effects from hydrocortisone and prednisone, was in the past using hydrocortisone injection using insulin syringe. Most recently she has been using methylprednisolone twice a day 4 mg in the morning and 2 mg in the afternoon, previously not able to taper the dose down.  Hypercalcemia: She has intermittent hypercalcemia since at least 2018, her PTH level was 31 previously and subsequent 23  and 38.  1, 25 vitamin D level was normal.  Serum calcium was as high as 11.2 and mostly intermittently mildly elevated.  She has been taking calcium and vitamin D supplement for osteoporosis.  # Osteoporosis managed by orthopedic surgeon/primary care provider on Prolia 60 mg every 6 months.  Other: Patient has multiple sclerosis.  Interval history Patient has complaints of gradual weight loss and occasional nausea.  Lately she has not been able to eat much as well.  No abdominal pain.  She has been taking methylprednisolone 4 mg in the morning and 2 mg in the afternoon and Florinef 0.05 mg daily.  REVIEW OF SYSTEMS:  As per history of present illness.   PAST MEDICAL HISTORY: Past Medical History:  Diagnosis Date   Addison's disease (HCC)    takes Solu Cortef daily   Anemia    takes Ferrous Sulfate daily   Anxiety    takes Xanax nightly   Arthritis    Chronic back pain    stenosis   Depression    takes Cymbalta daily   Dizziness    if b/p drops    Fibromyalgia    History of blood transfusion    no abnormal reaction noted   History of bronchitis    many yrs ago    Hypokalemia    takes Potassium daily   Hypotension    takes Florinef daily   IBS (irritable bowel syndrome)    takes Align daily   Insomnia    takes Trazodone nightly   Joint pain    Multiple sclerosis (HCC)    doesn't take any meds   Multiple sclerosis (HCC)    Nausea  vomiting and diarrhea 03/04/2023   New onset headache 12/22/2021   Nocturia    Osteoporosis    Palpitations    Peripheral neuropathy    Primary localized osteoarthritis of left knee 08/11/2020   Restless leg syndrome    Seasonal allergies    takes Zyrtec daily;uses Flonase daily as needed   Syncope    when missed methylprednisolone dose   Urinary frequency    takes Flomax daily   Weakness    numbness and tingling    PAST SURGICAL HISTORY: Past Surgical History:  Procedure Laterality Date   ABDOMINAL HYSTERECTOMY  02/07/1986    APPENDECTOMY  02/07/1986   CESAREAN SECTION  1973/1977   x2   CHOLECYSTECTOMY  02/08/1995   COLECTOMY  02/08/1988   COLONOSCOPY     ESOPHAGOGASTRODUODENOSCOPY     EYE SURGERY     bilateral - /w IOL- cataracts   FRACTURE SURGERY Right    rods and screws-right leg   LUMBAR LAMINECTOMY/DECOMPRESSION MICRODISCECTOMY Left 01/24/2013   Procedure: LUMBAR FIVE TO SACRAL ONE LUMBAR LAMINECTOMY/DECOMPRESSION MICRODISCECTOMY 1 LEVEL;  Surgeon: Tia Alert, MD;  Location: [facility];  Service: Neurosurgery;  Laterality: Left;   MAXIMUM ACCESS (MAS)POSTERIOR LUMBAR INTERBODY FUSION (PLIF) 1 LEVEL N/A 06/18/2014   Procedure: MAXIMUM ACCESS SURGERY POSTERIOR LUMBAR INTERBODY FUSION LUMBAR FIVE TO SACRAL ONE ;  Surgeon: Tia Alert, MD;  Location: [facility];  Service: Neurosurgery;  Laterality: N/A;   SPINAL CORD STIMULATOR BATTERY EXCHANGE Right 07/30/2021   Procedure: Spinal cord stimulator battery replacement;  Surgeon: Tia Alert, MD;  Location: [facility] OR;  Service: Neurosurgery;  Laterality: Right;   SPINAL CORD STIMULATOR IMPLANT     TOTAL KNEE ARTHROPLASTY Left 08/24/2020   Procedure: TOTAL KNEE ARTHROPLASTY;  Surgeon: Salvatore Marvel, MD;  Location: [facility];  Service: Orthopedics;  Laterality: Left;    ALLERGIES: Allergies  Allergen Reactions   Amoxicillin Other (See Comments)    Upset stomach   Aspirin     bruising   Azithromycin     stomach upset   Doxycycline Hyclate     GI upset   Hydroxyzine     Other Reaction(s): Hallucinations, Other   Hydroxyzine Hcl Other (See Comments)   Buspirone Palpitations   Buspirone Hcl Palpitations   Mirtazapine Palpitations    Weight gain    FAMILY HISTORY:  Family History  Problem Relation Age of Onset   Hypertension Mother    Stroke Mother    Heart attack Father    Tremor Brother     SOCIAL HISTORY: Social History   Socioeconomic History   Marital status: Married    Spouse name: [name]   Number of children: 2   Years of  education: 12   Highest education level: Not on file  Occupational History    Employer: DISABLED    Comment: Disabled  Tobacco Use   Smoking status: Never   Smokeless tobacco: Never  Vaping Use   Vaping status: Never Used  Substance and Sexual Activity   Alcohol use: Never   Drug use: Never   Sexual activity: Not on file  Other Topics Concern   Not on file  Social History Narrative   Pt lives at home with spouse. Gabriel Rung) [phone] (patient's cell)   [name]'s cell  [phone]      Caffeine Use: 1 cups daily.Right handed.Disabled.Education - high schoolPatient has two adult children.   Social Drivers of Corporate investment banker Strain: Not on file  Food Insecurity: No  Food Insecurity (03/05/2023)   Hunger Vital Sign    Worried About Running Out of Food in the Last Year: Never true    Ran Out of Food in the Last Year: Never true  Transportation Needs: No Transportation Needs (03/05/2023)   PRAPARE - Administrator, Civil Service (Medical): No    Lack of Transportation (Non-Medical): No  Physical Activity: Not on file  Stress: Not on file  Social Connections: Moderately Integrated (03/05/2023)   Social Connection and Isolation Panel [NHANES]    Frequency of Communication with Friends and Family: More than three times a week    Frequency of Social Gatherings with Friends and Family: Three times a week    Attends Religious Services: 1 to 4 times per year    Active Member of Clubs or Organizations: No    Attends Banker Meetings: Never    Marital Status: Married    MEDICATIONS:  Current Outpatient Medications  Medication Sig Dispense Refill   acetaminophen (TYLENOL) 325 MG tablet Take 1-2 tablets (325-650 mg total) by mouth every 4 (four) hours as needed for mild pain.     albuterol (VENTOLIN HFA) 108 (90 Base) MCG/ACT inhaler Inhale 2 puffs into the lungs every 6 (six) hours as needed for wheezing.     alfuzosin (UROXATRAL) 10 MG 24 hr tablet Take  10 mg by mouth daily with breakfast.     amLODipine (NORVASC) 2.5 MG tablet Take 1 tablet (2.5 mg total) by mouth daily. 30 tablet 0   bethanechol (URECHOLINE) 10 MG tablet Take 10 mg by mouth 2 (two) times daily.     cholecalciferol (VITAMIN D3) 25 MCG (1000 UNIT) tablet Take 1,000 Units by mouth every morning.     clonazePAM (KLONOPIN) 0.5 MG tablet Take 1 tablet (0.5 mg total) by mouth at bedtime as needed for anxiety. (Patient taking differently: Take 0.5 mg by mouth daily as needed for anxiety.) 30 tablet 1   CRANBERRY PO Take 1 tablet by mouth 2 (two) times daily.     dicyclomine (BENTYL) 20 MG tablet Take 1 tablet by mouth 4 (four) times daily as needed for spasms.     ferrous sulfate 325 (65 FE) MG tablet Take 325 mg by mouth daily with breakfast.     fludrocortisone (FLORINEF) 0.1 MG tablet Take 1/2 tablet daily. 45 tablet 3   fluticasone (CUTIVATE) 0.05 % cream Apply 1 application  topically 2 (two) times daily as needed for irritation (Rosacea).     fluticasone (FLONASE) 50 MCG/ACT nasal spray Place 1 spray into both nostrils daily as needed for allergies or rhinitis.     methylPREDNISolone (MEDROL) 4 MG tablet as directed Orally 1 tablet AM and 1/2 tablet afternoon and take additional in case of illness as instructed. 160 tablet 3   Multiple Vitamin (MULTIVITAMIN) tablet Take 1 tablet by mouth in the morning.     ondansetron (ZOFRAN) 4 MG tablet Take 1 tablet (4 mg total) by mouth every 8 (eight) hours as needed for nausea or vomiting. 90 tablet 3   ondansetron (ZOFRAN) 4 MG tablet Take 1 tablet (4 mg total) by mouth every 8 (eight) hours as needed for nausea or vomiting. 20 tablet 0   pantoprazole (PROTONIX) 20 MG tablet Take 20 mg by mouth every morning.     Probiotic Product (PROBIOTIC PO) Take 1 capsule by mouth every morning.     SSD 1 % cream Apply 1 Application topically daily.     tiZANidine (ZANAFLEX)  2 MG tablet Take 2 tablets (4 mg total) by mouth every 8 (eight) hours as  needed for muscle spasms. (Patient taking differently: Take 4 mg by mouth at bedtime.) 60 tablet 1   topiramate (TOPAMAX) 50 MG tablet Take 1 tablet (50 mg total) by mouth at bedtime. X 1 week, then 75 mg nightly x 1 week, ,then 100 mg at bedtime- for prevention of headaches 60 tablet 5   fluconazole (DIFLUCAN) 100 MG tablet Take 200 mg x1 and then  100 mg daily- for thrush for an additional 6 days-for thrush (Patient not taking: Reported on 05/15/2023) 8 tablet 0   HYDROcodone-acetaminophen (NORCO/VICODIN) 5-325 MG tablet Take 1 tablet by mouth every 8 (eight) hours as needed for moderate pain (pain score 4-6). (Patient not taking: Reported on 05/15/2023)     mupirocin ointment (BACTROBAN) 2 % Apply 1 Application topically at bedtime. (Patient not taking: Reported on 05/15/2023)     Polyethyl Glycol-Propyl Glycol (SYSTANE OP) Place 2 drops into both eyes daily as needed (for dry eyes). (Patient not taking: Reported on 05/15/2023)     polyethylene glycol (MIRALAX) 17 g packet Take 17 g by mouth 2 (two) times daily. 17 grams in 6 oz of favorite drink twice a day until bowel movement.  LAXITIVE.  Restart if two days since last bowel movement (Patient not taking: Reported on 05/15/2023) 14 packet 0   No current facility-administered medications for this visit.    PHYSICAL EXAM: Vitals:   05/15/23 1134  BP: (!) 102/50  Pulse: 97  Resp: 12  SpO2: 96%  Weight: 83 lb 12.8 oz (38 kg)  Height: 5' (1.524 m)   Body mass index is 16.37 kg/m.   General: Well developed, well nourished female in no apparent distress. HEENT: AT/[place], no external lesions. Hearing intact to the spoken word Eyes: Conjunctiva clear and no icterus. Neck: Trachea midline, neck supple  Abdomen: Soft, non tender Neurologic: Alert, oriented, normal speech Extremities: no pedal pitting edema.   Skin: Warm, color good.  Psychiatric: Does not appear depressed or anxious  PERTINENT HISTORIC LABORATORY AND IMAGING STUDIES:  All pertinent  laboratory results were reviewed. Please see HPI for further details.  Lab Results  Component Value Date   CO2 22 05/15/2023   CL 106 05/15/2023   NA 140 05/15/2023   GLUCOSE 91 05/15/2023   BUN 14 05/15/2023   No components found for: "CORTRAND", "CORTISOL TOTAL AM", "ALDOSTERONE", "RENIN ACTIVITY", "DEHYDROEPIANDROSTERONE SULFATE", "CATECHOLAMINES FRACTIONATED"    ASSESSMENT / PLAN  1. Secondary adrenal insufficiency (HCC)   2. Weight loss   3. Dysautonomia orthostatic hypotension syndrome   4. Hypercalcemia   5. Hypervitaminosis D      # Patient has longstanding history of secondary adrenal insufficiency. -Continue methylprednisolone 4 mg in the morning and 2 mg in the late afternoon. -Discussed illness rule out for steroid to take 2-3 times the usual dose in case of illness.  Advised to wear medical alert for adrenal insufficiency bracelet or necklace.  Discussed today: You may take the double the usual dose when you get ill or not feeling good for example nausea persistently, for 3 to 5 days and go back to the original dose.  # Patient has longstanding history of orthostatic dysautonomic hypotension syndrome. -Continue Florinef 0.05 mg daily. -Advised to monitor blood pressure including standing blood pressure at home.  # Hypercalcemia -Mild hyperglycemia intermittently.  PTH has been normal. -She has osteoporosis on Prolia managed by orthopedic surgeon/primary care provider. -She had  high vitamin D level, hypercalcemia can also be related with hypervitaminosis D.  She is taking multivitamin containing vitamin D 1 tablet daily.  Will continue to monitor.  Advised for no additional vitamin D supplement. -Will check BMP today.  # Weight loss: -Discussed that it can be multifactorial including eating better and nutritious diet.  Less likely to be related to adrenal insufficiency problem. -I would like to check thyroid function test today. -Follow-up with primary care  provider.  Diagnoses and all orders for this visit:  Secondary adrenal insufficiency (HCC) -     Basic Metabolic Panel Without GFR  Weight loss -     T4, free -     TSH  Dysautonomia orthostatic hypotension syndrome  Hypercalcemia  Hypervitaminosis D  Other orders -     Basic Metabolic Panel Without GFR   Normal thyroid function test.   Latest Reference Range & Units 05/15/23 12:27  TSH 0.40 - 4.50 mIU/L 1.05  T4,Free(Direct) 0.8 - 1.8 ng/dL 1.1     DISPOSITION Follow up in clinic in 4 months suggested.  All questions answered and patient verbalized understanding of the plan.  [place] Mairin Lindsley, MD [facility] Endocrinology [facility] [address] [place], [address] [place], [place] [address] Phone # [phone]  At least part of this note was generated using voice recognition software. Inadvertent word errors may have occurred, which were not recognized during the proofreading process.

## 2023-05-15 NOTE — Progress Notes (Unsigned)
 Patient checked in on time however due to glitch in system, did not show patient as arrived. Apologies given to patient by RN, and she was extremely nice about it, she just smiled and said "tell them don't lose me anymore". No other concerns at this time per patient. Ticket placed per front desk staff.

## 2023-05-16 ENCOUNTER — Ambulatory Visit (HOSPITAL_BASED_OUTPATIENT_CLINIC_OR_DEPARTMENT_OTHER): Admitting: Internal Medicine

## 2023-05-16 LAB — BASIC METABOLIC PANEL WITHOUT GFR
BUN/Creatinine Ratio: 10 (calc) (ref 6–22)
BUN: 14 mg/dL (ref 7–25)
CO2: 22 mmol/L (ref 20–32)
Calcium: 9.8 mg/dL (ref 8.6–10.4)
Chloride: 106 mmol/L (ref 98–110)
Creat: 1.4 mg/dL — ABNORMAL HIGH (ref 0.60–1.00)
Glucose, Bld: 91 mg/dL (ref 65–99)
Potassium: 3.5 mmol/L (ref 3.5–5.3)
Sodium: 140 mmol/L (ref 135–146)

## 2023-05-16 LAB — T4, FREE: Free T4: 1.1 ng/dL (ref 0.8–1.8)

## 2023-05-16 LAB — TSH: TSH: 1.05 m[IU]/L (ref 0.40–4.50)

## 2023-05-17 ENCOUNTER — Encounter: Payer: Self-pay | Admitting: Endocrinology

## 2023-05-17 ENCOUNTER — Ambulatory Visit: Payer: PPO | Admitting: Neurology

## 2023-05-18 DIAGNOSIS — L97822 Non-pressure chronic ulcer of other part of left lower leg with fat layer exposed: Secondary | ICD-10-CM | POA: Diagnosis not present

## 2023-05-22 ENCOUNTER — Encounter (HOSPITAL_BASED_OUTPATIENT_CLINIC_OR_DEPARTMENT_OTHER): Attending: Internal Medicine | Admitting: Internal Medicine

## 2023-05-22 DIAGNOSIS — L97812 Non-pressure chronic ulcer of other part of right lower leg with fat layer exposed: Secondary | ICD-10-CM | POA: Diagnosis not present

## 2023-05-22 DIAGNOSIS — S41101A Unspecified open wound of right upper arm, initial encounter: Secondary | ICD-10-CM | POA: Insufficient documentation

## 2023-05-22 DIAGNOSIS — X58XXXD Exposure to other specified factors, subsequent encounter: Secondary | ICD-10-CM | POA: Diagnosis not present

## 2023-05-22 DIAGNOSIS — I87311 Chronic venous hypertension (idiopathic) with ulcer of right lower extremity: Secondary | ICD-10-CM | POA: Diagnosis not present

## 2023-05-22 DIAGNOSIS — L97822 Non-pressure chronic ulcer of other part of left lower leg with fat layer exposed: Secondary | ICD-10-CM | POA: Diagnosis not present

## 2023-05-22 DIAGNOSIS — T798XXD Other early complications of trauma, subsequent encounter: Secondary | ICD-10-CM | POA: Diagnosis not present

## 2023-05-22 DIAGNOSIS — G35 Multiple sclerosis: Secondary | ICD-10-CM | POA: Insufficient documentation

## 2023-05-22 DIAGNOSIS — S51001D Unspecified open wound of right elbow, subsequent encounter: Secondary | ICD-10-CM | POA: Insufficient documentation

## 2023-05-22 DIAGNOSIS — L03113 Cellulitis of right upper limb: Secondary | ICD-10-CM | POA: Diagnosis not present

## 2023-05-22 DIAGNOSIS — S51011A Laceration without foreign body of right elbow, initial encounter: Secondary | ICD-10-CM | POA: Diagnosis not present

## 2023-05-23 DIAGNOSIS — K5641 Fecal impaction: Secondary | ICD-10-CM | POA: Diagnosis not present

## 2023-05-23 DIAGNOSIS — E43 Unspecified severe protein-calorie malnutrition: Secondary | ICD-10-CM | POA: Diagnosis not present

## 2023-05-23 DIAGNOSIS — S51001D Unspecified open wound of right elbow, subsequent encounter: Secondary | ICD-10-CM | POA: Diagnosis not present

## 2023-05-23 DIAGNOSIS — G629 Polyneuropathy, unspecified: Secondary | ICD-10-CM | POA: Diagnosis not present

## 2023-05-23 DIAGNOSIS — I10 Essential (primary) hypertension: Secondary | ICD-10-CM | POA: Diagnosis not present

## 2023-05-23 DIAGNOSIS — G43709 Chronic migraine without aura, not intractable, without status migrainosus: Secondary | ICD-10-CM | POA: Diagnosis not present

## 2023-05-23 DIAGNOSIS — M549 Dorsalgia, unspecified: Secondary | ICD-10-CM | POA: Diagnosis not present

## 2023-05-23 DIAGNOSIS — G35 Multiple sclerosis: Secondary | ICD-10-CM | POA: Diagnosis not present

## 2023-05-23 DIAGNOSIS — E271 Primary adrenocortical insufficiency: Secondary | ICD-10-CM | POA: Diagnosis not present

## 2023-05-23 DIAGNOSIS — I951 Orthostatic hypotension: Secondary | ICD-10-CM | POA: Diagnosis not present

## 2023-05-23 DIAGNOSIS — M797 Fibromyalgia: Secondary | ICD-10-CM | POA: Diagnosis not present

## 2023-05-23 DIAGNOSIS — G8929 Other chronic pain: Secondary | ICD-10-CM | POA: Diagnosis not present

## 2023-05-24 DIAGNOSIS — S2231XD Fracture of one rib, right side, subsequent encounter for fracture with routine healing: Secondary | ICD-10-CM | POA: Diagnosis not present

## 2023-05-24 DIAGNOSIS — M542 Cervicalgia: Secondary | ICD-10-CM | POA: Diagnosis not present

## 2023-05-27 DIAGNOSIS — S51001D Unspecified open wound of right elbow, subsequent encounter: Secondary | ICD-10-CM | POA: Diagnosis not present

## 2023-05-27 DIAGNOSIS — L98492 Non-pressure chronic ulcer of skin of other sites with fat layer exposed: Secondary | ICD-10-CM | POA: Diagnosis not present

## 2023-05-27 DIAGNOSIS — L97822 Non-pressure chronic ulcer of other part of left lower leg with fat layer exposed: Secondary | ICD-10-CM | POA: Diagnosis not present

## 2023-05-27 DIAGNOSIS — S81802D Unspecified open wound, left lower leg, subsequent encounter: Secondary | ICD-10-CM | POA: Diagnosis not present

## 2023-05-29 ENCOUNTER — Encounter (HOSPITAL_BASED_OUTPATIENT_CLINIC_OR_DEPARTMENT_OTHER): Admitting: Internal Medicine

## 2023-05-29 DIAGNOSIS — S51001D Unspecified open wound of right elbow, subsequent encounter: Secondary | ICD-10-CM

## 2023-05-29 DIAGNOSIS — S41101A Unspecified open wound of right upper arm, initial encounter: Secondary | ICD-10-CM

## 2023-05-29 DIAGNOSIS — L03113 Cellulitis of right upper limb: Secondary | ICD-10-CM

## 2023-05-29 DIAGNOSIS — T798XXD Other early complications of trauma, subsequent encounter: Secondary | ICD-10-CM | POA: Diagnosis not present

## 2023-06-03 LAB — AEROBIC/ANAEROBIC CULTURE W GRAM STAIN (SURGICAL/DEEP WOUND)

## 2023-06-05 ENCOUNTER — Ambulatory Visit (HOSPITAL_BASED_OUTPATIENT_CLINIC_OR_DEPARTMENT_OTHER): Admitting: Internal Medicine

## 2023-06-06 ENCOUNTER — Encounter (HOSPITAL_BASED_OUTPATIENT_CLINIC_OR_DEPARTMENT_OTHER): Admitting: Internal Medicine

## 2023-06-06 DIAGNOSIS — L03113 Cellulitis of right upper limb: Secondary | ICD-10-CM

## 2023-06-06 DIAGNOSIS — S41101A Unspecified open wound of right upper arm, initial encounter: Secondary | ICD-10-CM | POA: Diagnosis not present

## 2023-06-06 DIAGNOSIS — T798XXD Other early complications of trauma, subsequent encounter: Secondary | ICD-10-CM | POA: Diagnosis not present

## 2023-06-06 DIAGNOSIS — S51001D Unspecified open wound of right elbow, subsequent encounter: Secondary | ICD-10-CM

## 2023-06-08 DIAGNOSIS — E7800 Pure hypercholesterolemia, unspecified: Secondary | ICD-10-CM | POA: Diagnosis not present

## 2023-06-08 DIAGNOSIS — M810 Age-related osteoporosis without current pathological fracture: Secondary | ICD-10-CM | POA: Diagnosis not present

## 2023-06-08 DIAGNOSIS — N183 Chronic kidney disease, stage 3 unspecified: Secondary | ICD-10-CM | POA: Diagnosis not present

## 2023-06-08 DIAGNOSIS — E2740 Unspecified adrenocortical insufficiency: Secondary | ICD-10-CM | POA: Diagnosis not present

## 2023-06-08 DIAGNOSIS — G35 Multiple sclerosis: Secondary | ICD-10-CM | POA: Diagnosis not present

## 2023-06-08 DIAGNOSIS — K5909 Other constipation: Secondary | ICD-10-CM | POA: Diagnosis not present

## 2023-06-08 DIAGNOSIS — F419 Anxiety disorder, unspecified: Secondary | ICD-10-CM | POA: Diagnosis not present

## 2023-06-08 DIAGNOSIS — K219 Gastro-esophageal reflux disease without esophagitis: Secondary | ICD-10-CM | POA: Diagnosis not present

## 2023-06-08 DIAGNOSIS — D508 Other iron deficiency anemias: Secondary | ICD-10-CM | POA: Diagnosis not present

## 2023-06-08 DIAGNOSIS — Z0000 Encounter for general adult medical examination without abnormal findings: Secondary | ICD-10-CM | POA: Diagnosis not present

## 2023-06-08 DIAGNOSIS — F339 Major depressive disorder, recurrent, unspecified: Secondary | ICD-10-CM | POA: Diagnosis not present

## 2023-06-08 DIAGNOSIS — I10 Essential (primary) hypertension: Secondary | ICD-10-CM | POA: Diagnosis not present

## 2023-06-12 ENCOUNTER — Ambulatory Visit: Payer: PPO | Admitting: Physical Medicine and Rehabilitation

## 2023-06-12 ENCOUNTER — Encounter (HOSPITAL_BASED_OUTPATIENT_CLINIC_OR_DEPARTMENT_OTHER): Attending: Internal Medicine | Admitting: Internal Medicine

## 2023-06-12 DIAGNOSIS — T798XXD Other early complications of trauma, subsequent encounter: Secondary | ICD-10-CM | POA: Diagnosis not present

## 2023-06-12 DIAGNOSIS — S41101A Unspecified open wound of right upper arm, initial encounter: Secondary | ICD-10-CM | POA: Diagnosis not present

## 2023-06-12 DIAGNOSIS — S40812A Abrasion of left upper arm, initial encounter: Secondary | ICD-10-CM | POA: Diagnosis not present

## 2023-06-12 DIAGNOSIS — S51001D Unspecified open wound of right elbow, subsequent encounter: Secondary | ICD-10-CM

## 2023-06-12 DIAGNOSIS — G35 Multiple sclerosis: Secondary | ICD-10-CM | POA: Insufficient documentation

## 2023-06-12 DIAGNOSIS — W19XXXA Unspecified fall, initial encounter: Secondary | ICD-10-CM | POA: Insufficient documentation

## 2023-06-12 DIAGNOSIS — S51001A Unspecified open wound of right elbow, initial encounter: Secondary | ICD-10-CM | POA: Insufficient documentation

## 2023-06-13 DIAGNOSIS — M5416 Radiculopathy, lumbar region: Secondary | ICD-10-CM | POA: Diagnosis not present

## 2023-06-14 DIAGNOSIS — H532 Diplopia: Secondary | ICD-10-CM | POA: Diagnosis not present

## 2023-06-14 DIAGNOSIS — H5010 Unspecified exotropia: Secondary | ICD-10-CM | POA: Diagnosis not present

## 2023-06-19 ENCOUNTER — Encounter (HOSPITAL_BASED_OUTPATIENT_CLINIC_OR_DEPARTMENT_OTHER): Admitting: Internal Medicine

## 2023-06-19 DIAGNOSIS — S51001D Unspecified open wound of right elbow, subsequent encounter: Secondary | ICD-10-CM

## 2023-06-19 DIAGNOSIS — S41101A Unspecified open wound of right upper arm, initial encounter: Secondary | ICD-10-CM

## 2023-06-19 DIAGNOSIS — T798XXD Other early complications of trauma, subsequent encounter: Secondary | ICD-10-CM | POA: Diagnosis not present

## 2023-06-19 DIAGNOSIS — S40812A Abrasion of left upper arm, initial encounter: Secondary | ICD-10-CM | POA: Diagnosis not present

## 2023-06-22 ENCOUNTER — Other Ambulatory Visit: Payer: PPO

## 2023-06-22 DIAGNOSIS — L89891 Pressure ulcer of other site, stage 1: Secondary | ICD-10-CM | POA: Diagnosis not present

## 2023-06-26 ENCOUNTER — Ambulatory Visit (HOSPITAL_BASED_OUTPATIENT_CLINIC_OR_DEPARTMENT_OTHER): Admitting: Internal Medicine

## 2023-06-27 ENCOUNTER — Ambulatory Visit: Payer: PPO | Admitting: Endocrinology

## 2023-06-27 DIAGNOSIS — M5416 Radiculopathy, lumbar region: Secondary | ICD-10-CM | POA: Diagnosis not present

## 2023-07-04 ENCOUNTER — Encounter (HOSPITAL_BASED_OUTPATIENT_CLINIC_OR_DEPARTMENT_OTHER): Admitting: Internal Medicine

## 2023-07-04 DIAGNOSIS — S41101A Unspecified open wound of right upper arm, initial encounter: Secondary | ICD-10-CM

## 2023-07-04 DIAGNOSIS — S51001D Unspecified open wound of right elbow, subsequent encounter: Secondary | ICD-10-CM | POA: Diagnosis not present

## 2023-07-04 DIAGNOSIS — T798XXD Other early complications of trauma, subsequent encounter: Secondary | ICD-10-CM | POA: Diagnosis not present

## 2023-07-04 DIAGNOSIS — G35 Multiple sclerosis: Secondary | ICD-10-CM

## 2023-07-04 DIAGNOSIS — S40812A Abrasion of left upper arm, initial encounter: Secondary | ICD-10-CM

## 2023-07-04 DIAGNOSIS — S51001A Unspecified open wound of right elbow, initial encounter: Secondary | ICD-10-CM | POA: Diagnosis not present

## 2023-07-04 DIAGNOSIS — W19XXXA Unspecified fall, initial encounter: Secondary | ICD-10-CM | POA: Diagnosis not present

## 2023-07-10 ENCOUNTER — Ambulatory Visit (HOSPITAL_BASED_OUTPATIENT_CLINIC_OR_DEPARTMENT_OTHER): Admitting: Internal Medicine

## 2023-07-17 ENCOUNTER — Encounter (HOSPITAL_BASED_OUTPATIENT_CLINIC_OR_DEPARTMENT_OTHER): Attending: Internal Medicine | Admitting: Internal Medicine

## 2023-07-17 DIAGNOSIS — L739 Follicular disorder, unspecified: Secondary | ICD-10-CM | POA: Diagnosis not present

## 2023-07-17 DIAGNOSIS — T798XXD Other early complications of trauma, subsequent encounter: Secondary | ICD-10-CM | POA: Diagnosis not present

## 2023-07-17 DIAGNOSIS — S51001D Unspecified open wound of right elbow, subsequent encounter: Secondary | ICD-10-CM | POA: Insufficient documentation

## 2023-07-17 DIAGNOSIS — X58XXXA Exposure to other specified factors, initial encounter: Secondary | ICD-10-CM | POA: Diagnosis not present

## 2023-07-17 DIAGNOSIS — S41101A Unspecified open wound of right upper arm, initial encounter: Secondary | ICD-10-CM | POA: Insufficient documentation

## 2023-07-17 DIAGNOSIS — G35 Multiple sclerosis: Secondary | ICD-10-CM | POA: Insufficient documentation

## 2023-07-17 DIAGNOSIS — S40812A Abrasion of left upper arm, initial encounter: Secondary | ICD-10-CM | POA: Diagnosis not present

## 2023-07-22 DIAGNOSIS — I951 Orthostatic hypotension: Secondary | ICD-10-CM | POA: Diagnosis not present

## 2023-07-22 DIAGNOSIS — G8929 Other chronic pain: Secondary | ICD-10-CM | POA: Diagnosis not present

## 2023-07-22 DIAGNOSIS — I10 Essential (primary) hypertension: Secondary | ICD-10-CM | POA: Diagnosis not present

## 2023-07-22 DIAGNOSIS — E43 Unspecified severe protein-calorie malnutrition: Secondary | ICD-10-CM | POA: Diagnosis not present

## 2023-07-22 DIAGNOSIS — S51001D Unspecified open wound of right elbow, subsequent encounter: Secondary | ICD-10-CM | POA: Diagnosis not present

## 2023-07-22 DIAGNOSIS — M549 Dorsalgia, unspecified: Secondary | ICD-10-CM | POA: Diagnosis not present

## 2023-07-22 DIAGNOSIS — M797 Fibromyalgia: Secondary | ICD-10-CM | POA: Diagnosis not present

## 2023-07-22 DIAGNOSIS — G35 Multiple sclerosis: Secondary | ICD-10-CM | POA: Diagnosis not present

## 2023-07-22 DIAGNOSIS — E271 Primary adrenocortical insufficiency: Secondary | ICD-10-CM | POA: Diagnosis not present

## 2023-07-22 DIAGNOSIS — G43709 Chronic migraine without aura, not intractable, without status migrainosus: Secondary | ICD-10-CM | POA: Diagnosis not present

## 2023-07-22 DIAGNOSIS — K5641 Fecal impaction: Secondary | ICD-10-CM | POA: Diagnosis not present

## 2023-07-22 DIAGNOSIS — G629 Polyneuropathy, unspecified: Secondary | ICD-10-CM | POA: Diagnosis not present

## 2023-08-07 DIAGNOSIS — L308 Other specified dermatitis: Secondary | ICD-10-CM | POA: Diagnosis not present

## 2023-08-07 DIAGNOSIS — L080 Pyoderma: Secondary | ICD-10-CM | POA: Diagnosis not present

## 2023-08-09 ENCOUNTER — Ambulatory Visit: Payer: PPO | Admitting: Physical Medicine and Rehabilitation

## 2023-08-09 DIAGNOSIS — R350 Frequency of micturition: Secondary | ICD-10-CM | POA: Diagnosis not present

## 2023-08-10 ENCOUNTER — Ambulatory Visit: Admitting: Neurology

## 2023-08-14 ENCOUNTER — Encounter (HOSPITAL_BASED_OUTPATIENT_CLINIC_OR_DEPARTMENT_OTHER): Attending: Internal Medicine | Admitting: Internal Medicine

## 2023-08-14 DIAGNOSIS — S40812A Abrasion of left upper arm, initial encounter: Secondary | ICD-10-CM | POA: Insufficient documentation

## 2023-08-14 DIAGNOSIS — S51001D Unspecified open wound of right elbow, subsequent encounter: Secondary | ICD-10-CM | POA: Diagnosis not present

## 2023-08-14 DIAGNOSIS — S51001A Unspecified open wound of right elbow, initial encounter: Secondary | ICD-10-CM | POA: Diagnosis not present

## 2023-08-14 DIAGNOSIS — T798XXA Other early complications of trauma, initial encounter: Secondary | ICD-10-CM | POA: Insufficient documentation

## 2023-08-14 DIAGNOSIS — S41101A Unspecified open wound of right upper arm, initial encounter: Secondary | ICD-10-CM | POA: Insufficient documentation

## 2023-08-14 DIAGNOSIS — T798XXD Other early complications of trauma, subsequent encounter: Secondary | ICD-10-CM | POA: Diagnosis not present

## 2023-08-14 DIAGNOSIS — X58XXXA Exposure to other specified factors, initial encounter: Secondary | ICD-10-CM | POA: Insufficient documentation

## 2023-08-14 DIAGNOSIS — G35 Multiple sclerosis: Secondary | ICD-10-CM | POA: Diagnosis not present

## 2023-08-15 ENCOUNTER — Ambulatory Visit: Payer: PPO | Admitting: Neurology

## 2023-08-15 DIAGNOSIS — L308 Other specified dermatitis: Secondary | ICD-10-CM | POA: Diagnosis not present

## 2023-08-28 ENCOUNTER — Ambulatory Visit: Admitting: Neurology

## 2023-08-28 VITALS — Ht 60.0 in | Wt 86.5 lb

## 2023-08-28 DIAGNOSIS — G35 Multiple sclerosis: Secondary | ICD-10-CM

## 2023-08-28 DIAGNOSIS — G43709 Chronic migraine without aura, not intractable, without status migrainosus: Secondary | ICD-10-CM | POA: Diagnosis not present

## 2023-08-28 DIAGNOSIS — I951 Orthostatic hypotension: Secondary | ICD-10-CM | POA: Diagnosis not present

## 2023-08-28 DIAGNOSIS — G609 Hereditary and idiopathic neuropathy, unspecified: Secondary | ICD-10-CM | POA: Diagnosis not present

## 2023-08-28 DIAGNOSIS — R269 Unspecified abnormalities of gait and mobility: Secondary | ICD-10-CM

## 2023-08-28 NOTE — Progress Notes (Addendum)
 ASSESSMENT AND PLAN 78 y.o. year old female   1.  Possible relapsing remitting multiple sclerosis -MRI of the brain in November 2024 showed stable periventricular white matter lesions consistent with MS, no new or enhancing lesions were seen  2.  Gait abnormality 3.  Chronic low back pain 4.  Post lumbar fusion May 2024, ORIF L3 fracture July 2024 -Has close follow-up with Dr. Joshua, gets injections about every 3 months, cannot take hydrocodone  due to constipation  5.  Restless leg syndrome - On Horizant  600 mg daily, Wellbutrin  - We stopped gabapentin  due to possible dizziness - She mentions Lyrica, to see if help, concern for GI side effect with bowel obstruction, can discuss with PCP  6.  Anxiety -Felt Cymbalta  causing dizziness, on Lexapro   7. Headaches with migraine features  -No benefit from Aimovig , Topamax  -Normal ESR C-reactive protein  8.  Dizziness 9.  Orthostatic hypotension 10.  Addison's Disease - Close follow-up with endocrinology  11. Vision disturbance -Better with prisms -Addendum 02/22/2023 SS: Records from Dr. Octavia reporting diplopia, VF and OCT do not reveal any causes of her complaint.  Will reevaluate in 4 months since problem has already been going on for 2 months to consider prism  glasses.  She had MRI of the brain with and without contrast 12/28/2022 showing no acute problem.  12. Weight loss  - Down to 86 pounds, work with primary care   Follow up with Dr. Onita in 6-8 months for management for above chronic conditions, if stable can return PRN?   DIAGNOSTIC DATA (LABS, IMAGING, TESTING) - I reviewed patient records, labs, notes, testing and imaging myself where available.   HISTORY  Katrina Rivera is a very pleasant 78 year-old right-handed woman, with relapsing remitting multiple sclerosis, was treated with Betaseron 4 -5 years, but could not tolerate the side effect, now is not on any immunomodulation therapy. neuropathic pain of bilateral lower  extremities.   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    She has baseline gait difficulty, she fell in May 2013, fractured both elbows and her left knee cap.  She intermittently has been using a cane or walker.  She could not tolerate Requip  because of nausea. She has been on gabapentin  300 mg 3 bid. She gained significant weight gain when on Lyrica, and therefore was switched back to gabapentin . She has orthostatic hypotension, and is on midodrine  2.5 mg and fludrocortisone  0.1 mg 1 in AM and 2 at night, and she is also on 20 mg of hydrocortisone .   She complains of bilateral lower extremity burning stinging sensation, getting worse when she sits still, difficulty falling into sleep, she has the urge to move because of bilateral extremity discomfort, has tried Requip , could not tolerate it because of GI side effects, Neuprol patch does not work either, she also tried Elavil, Cymbalta  in the past, could not tolerate it due to side effect.   UPDATE Nov 17 2020: She is accompanied by her husband at today's visit, just had right knee replacement in July 2022, finishing up physical therapy, continue has  gait abnormality, wearing ankle brace now, seems to help her some  She continue to take Horizant  600 mg at bedtime, gabapentin  100 mg 1 to 2 tablets every night as needed  She complains of intermittent diffuse body achy sensation,  UPDATE May 17 2022: She is companied by her husband, constellation of complaints,  worsening anxiety, difficulty with memory, difficulty sleeping, worsening low back pain, is under the care of neurosurgeon Dr. Alm Molt, planning on CT myelogram, previous lumbar decompression surgery twice,  She is already on polypharmacy treatment, including gabapentin  300 mg 2 to 3 tablets at bedtime, horizant  600 mg every night, Cymbalta  60 mg daily,  Her headache is under better control, tolerating Ajovy  monthly  Personally reviewed MRI of the brain in October 2023, no acute abnormality, multiple T2/FLAIR signal abnormality, small vessel disease, versus possible multiple sclerosis, compared to previous study in 2015, there was 2 new foci of white matter and right subcortical region, no contrast-enhancement  Update August 04, 2022 SS: Saw Dr. Onita in April 2024, started on clonazepam  0.5 mg as needed, did 3 months of Ajovy  didn't see much benefit, switched to Aimovig  and Nurtec PRN. Admitted 06/22/22 with Dr. Molt she had PLIF L3-4.  With no surgical complications. Since surgery her left leg has been numb, tingling, weak. Had MRI Lumbar spine 6/22, seeing Dr. Molt next week. Fall 2 weeks post-op, went backwards. Using walker now. Needs refill on Klonopin  0.5 mg 1/2 tablet daily PRN anxiety, needs Horizant , Cymbalta , tizanidine . On Aimovig  70 mg, has only done 1 injection. Has only needed Nurtec once, it helped. Gabapentin  900 mg at bedtime.  Having a lot of low back pain, cannot straighten up. She worries her MS is coming back. Had trouble with PT, paused it.   Update February 15, 2023 SS: Had 2nd lumbar surgery in July had lumbar fusion. In the ER in November had a fall getting out of bed, hit elbow on the nightstand, head against the bed, husband put her on the bed. Had bladder retention, placed foley cath, started oral antibiotics for UTI. MRI brain in November 2024 showed stable MS lesions. MRI thoracic spine was fine. Has not started urine cath, has had 2 UTIs since. Sees urologist. Has dizziness  for months, constant, stopped Cymbalta /gabapentin  to see if benefit. Went back to Horizant . Saw Dr. Octavia today, when reading the words are squished together, unclear etiology told her to follow up in 4 months. No longer on Aimovig . Has headache daily, generalized, takes Tylenol . Saw Dr. Lovorn today, started Topamax  100 mg at bedtime for headaches. Has poorly healing wound to right shin. Takes hydrocodone  daily for back pain. Remains on Florinef , not drinking enough water.  Seeing endocrinology next week.  Orthostatic today 166/80, 154/78, 133/72.  Update 08/28/23 SS: worsening leg pain with neuropathy, fibromyalgia, RLS. Has spinal stimulator, uses lidocaine  cream. 5 falls, lose her balance. Headaches still present daily, generalized, Topamax  not helping. Has lost 25 lbs, down to 86 lbs. Bowel obstruction in Jan. Remains on Horizant . Mentions Lyrica, make her gain weight? Sees Dr. Molt for injection trigger point to lumbar spine every 3 months. Can't take hydrocodone  b/c constipation. Skin rash to right forearm with blisters, cultured as bacteria. Has prism  glasses, diplopia is better. Supposed to go see urology to learn self cath.   REVIEW OF SYSTEMS: Out of a complete 14 system review of symptoms, the patient complains only of the following symptoms, and all other reviewed systems are negative.  See HPI  PHYSICAL  EXAM  Vitals:   05/17/22 1320  BP: (!) 169/91  Pulse: (!) 104  Weight: 112 lb 8 oz (51 kg)  Height: 5' (1.524 m)   Body mass index is 21.97 kg/m.  PHYSICAL EXAMNIATION:  General: The patient is alert and cooperative at the time of the examination.  Using a walker postop. Wound to right lower leg  Skin: No significant peripheral edema is noted. Healing would to right forearm   Neurologic Exam  Mental status: The patient is alert and oriented x 3 at the time of the examination. The patient has apparent normal recent and remote memory, with an apparently normal attention span  and concentration ability.  In a good mood.   Cranial nerves: Facial symmetry is present. Speech is normal, no aphasia or dysarthria is noted. Extraocular movements are full. Visual fields are full.  Motor: The patient has good strength, exception left hip flexion 4/5  Sensory examination: Soft touch sensation is symmetric on the face, arms, and legs.  Coordination: The patient has good finger-nose-finger and heel-to-shin bilaterally.  Gait and station: Slow to rise from seated position, has to push off, gait is cautious, wide-based, uses walker in the hallway  Reflexes: Deep tendon reflexes are symmetric and normal.  ALLERGIES: Allergies  Allergen Reactions   Amoxicillin Other (See Comments)    Upset stomach   Aspirin      bruising   Azithromycin     stomach upset   Doxycycline Hyclate     GI upset   Hydroxyzine     Other Reaction(s): Hallucinations, Other   Hydroxyzine Hcl Other (See Comments)   Buspirone Palpitations   Buspirone Hcl Palpitations   Mirtazapine Palpitations    Weight gain    HOME MEDICATIONS: Outpatient Medications Prior to Visit  Medication Sig Dispense Refill   acetaminophen  (TYLENOL ) 325 MG tablet Take 1-2 tablets (325-650 mg total) by mouth every 4 (four) hours as needed for mild pain.     alfuzosin  (UROXATRAL ) 10 MG 24 hr tablet Take 10 mg by mouth daily with breakfast.     bethanechol (URECHOLINE) 10 MG tablet Take 10 mg by mouth 2 (two) times daily.     buPROPion  (WELLBUTRIN  XL) 300 MG 24 hr tablet take 1 tablet in the morning Orally Once a day; Duration: 90     cholecalciferol  (VITAMIN D3) 25 MCG (1000 UNIT) tablet Take 1,000 Units by mouth every morning.     CRANBERRY PO Take 1 tablet by mouth 2 (two) times daily.     escitalopram  (LEXAPRO ) 5 MG tablet Take 5 mg by mouth daily.     fludrocortisone  (FLORINEF ) 0.1 MG tablet Take 1/2 tablet daily. 45 tablet 3   HORIZANT  600 MG TBCR SMARTSIG:1 Tablet(s) By Mouth Every Evening     methylPREDNISolone   (MEDROL ) 4 MG tablet as directed Orally 1 tablet AM and 1/2 tablet afternoon and take additional in case of illness as instructed. 160 tablet 3   Multiple Vitamin (MULTIVITAMIN) tablet Take 1 tablet by mouth in the morning.     mupirocin ointment (BACTROBAN) 2 % Apply 1 Application topically at bedtime.     ondansetron  (ZOFRAN ) 4 MG tablet Take 1 tablet (4 mg total) by mouth every 8 (eight) hours as needed for nausea or vomiting. 90 tablet 3   ondansetron  (ZOFRAN ) 4 MG tablet Take 1 tablet (4 mg total) by mouth every 8 (eight) hours as needed for nausea or vomiting. 20 tablet 0   Probiotic Product (PROBIOTIC PO) Take 1 capsule by  mouth every morning.     terbinafine (LAMISIL) 250 MG tablet Take 250 mg by mouth daily.     tiZANidine  (ZANAFLEX ) 2 MG tablet Take 2 tablets (4 mg total) by mouth every 8 (eight) hours as needed for muscle spasms. 60 tablet 1   albuterol  (VENTOLIN  HFA) 108 (90 Base) MCG/ACT inhaler Inhale 2 puffs into the lungs every 6 (six) hours as needed for wheezing. (Patient not taking: Reported on 08/28/2023)     amLODipine  (NORVASC ) 2.5 MG tablet Take 1 tablet (2.5 mg total) by mouth daily. (Patient not taking: Reported on 08/28/2023) 30 tablet 0   clonazePAM  (KLONOPIN ) 0.5 MG tablet Take 1 tablet (0.5 mg total) by mouth at bedtime as needed for anxiety. (Patient not taking: Reported on 08/28/2023) 30 tablet 1   dicyclomine  (BENTYL ) 20 MG tablet Take 1 tablet by mouth 4 (four) times daily as needed for spasms. (Patient not taking: Reported on 08/28/2023)     ferrous sulfate  325 (65 FE) MG tablet Take 325 mg by mouth daily with breakfast. (Patient not taking: Reported on 08/28/2023)     fluconazole  (DIFLUCAN ) 100 MG tablet Take 200 mg x1 and then  100 mg daily- for thrush for an additional 6 days-for thrush (Patient not taking: Reported on 08/28/2023) 8 tablet 0   fluticasone  (CUTIVATE ) 0.05 % cream Apply 1 application  topically 2 (two) times daily as needed for irritation (Rosacea).  (Patient not taking: Reported on 08/28/2023)     fluticasone  (FLONASE ) 50 MCG/ACT nasal spray Place 1 spray into both nostrils daily as needed for allergies or rhinitis. (Patient not taking: Reported on 08/28/2023)     HYDROcodone -acetaminophen  (NORCO/VICODIN) 5-325 MG tablet Take 1 tablet by mouth every 8 (eight) hours as needed for moderate pain (pain score 4-6). (Patient not taking: Reported on 08/28/2023)     pantoprazole  (PROTONIX ) 20 MG tablet Take 20 mg by mouth every morning. (Patient not taking: Reported on 08/28/2023)     Polyethyl Glycol-Propyl Glycol (SYSTANE OP) Place 2 drops into both eyes daily as needed (for dry eyes). (Patient not taking: Reported on 08/28/2023)     polyethylene glycol (MIRALAX ) 17 g packet Take 17 g by mouth 2 (two) times daily. 17 grams in 6 oz of favorite drink twice a day until bowel movement.  LAXITIVE.  Restart if two days since last bowel movement (Patient not taking: Reported on 08/28/2023) 14 packet 0   SSD 1 % cream Apply 1 Application topically daily. (Patient not taking: Reported on 08/28/2023)     topiramate  (TOPAMAX ) 50 MG tablet Take 1 tablet (50 mg total) by mouth at bedtime. X 1 week, then 75 mg nightly x 1 week, ,then 100 mg at bedtime- for prevention of headaches (Patient not taking: Reported on 08/28/2023) 60 tablet 5   No facility-administered medications prior to visit.    PAST MEDICAL HISTORY: Past Medical History:  Diagnosis Date   Addison's disease (HCC)    takes Solu Cortef  daily   Anemia    takes Ferrous Sulfate  daily   Anxiety    takes Xanax  nightly   Arthritis    Chronic back pain    stenosis   Depression    takes Cymbalta  daily   Dizziness    if b/p drops    Fibromyalgia    History of blood transfusion    no abnormal reaction noted   History of bronchitis    many yrs ago    Hypokalemia    takes Potassium daily   Hypotension  takes Florinef  daily   IBS (irritable bowel syndrome)    takes Align daily   Insomnia    takes  Trazodone  nightly   Joint pain    Multiple sclerosis (HCC)    doesn't take any meds   Multiple sclerosis (HCC)    Nausea vomiting and diarrhea 03/04/2023   New onset headache 12/22/2021   Nocturia    Osteoporosis    Palpitations    Peripheral neuropathy    Primary localized osteoarthritis of left knee 08/11/2020   Restless leg syndrome    Seasonal allergies    takes Zyrtec daily;uses Flonase  daily as needed   Syncope    when missed methylprednisolone  dose   Urinary frequency    takes Flomax  daily   Weakness    numbness and tingling    PAST SURGICAL HISTORY: Past Surgical History:  Procedure Laterality Date   ABDOMINAL HYSTERECTOMY  02/07/1986   APPENDECTOMY  02/07/1986   CESAREAN SECTION  1973/1977   x2   CHOLECYSTECTOMY  02/08/1995   COLECTOMY  02/08/1988   COLONOSCOPY     ESOPHAGOGASTRODUODENOSCOPY     EYE SURGERY     bilateral - /w IOL- cataracts   FRACTURE SURGERY Right    rods and screws-right leg   LUMBAR LAMINECTOMY/DECOMPRESSION MICRODISCECTOMY Left 01/24/2013   Procedure: LUMBAR FIVE TO SACRAL ONE LUMBAR LAMINECTOMY/DECOMPRESSION MICRODISCECTOMY 1 LEVEL;  Surgeon: Alm GORMAN Molt, MD;  Location: [facility];  Service: Neurosurgery;  Laterality: Left;   MAXIMUM ACCESS (MAS)POSTERIOR LUMBAR INTERBODY FUSION (PLIF) 1 LEVEL N/A 06/18/2014   Procedure: MAXIMUM ACCESS SURGERY POSTERIOR LUMBAR INTERBODY FUSION LUMBAR FIVE TO SACRAL ONE ;  Surgeon: Alm GORMAN Molt, MD;  Location: [facility];  Service: Neurosurgery;  Laterality: N/A;   SPINAL CORD STIMULATOR BATTERY EXCHANGE Right 07/30/2021   Procedure: Spinal cord stimulator battery replacement;  Surgeon: Molt Alm GORMAN, MD;  Location: [facility];  Service: Neurosurgery;  Laterality: Right;   SPINAL CORD STIMULATOR IMPLANT     TOTAL KNEE ARTHROPLASTY Left 08/24/2020   Procedure: TOTAL KNEE ARTHROPLASTY;  Surgeon: Jane Charleston, MD;  Location: [facility];  Service: Orthopedics;  Laterality: Left;    FAMILY HISTORY: Family  History  Problem Relation Age of Onset   Hypertension Mother    Stroke Mother    Heart attack Father    Tremor Brother     SOCIAL HISTORY: Social History   Socioeconomic History   Marital status: Married    Spouse name: Katrina Rivera   Number of children: 2   Years of education: 12   Highest education level: Not on file  Occupational History    Employer: DISABLED    Comment: Disabled  Tobacco Use   Smoking status: Never   Smokeless tobacco: Never  Vaping Use   Vaping status: Never Used  Substance and Sexual Activity   Alcohol use: Never   Drug use: Never   Sexual activity: Not on file  Other Topics Concern   Not on file  Social History Narrative   Pt lives at home with spouse. Blondie) [phone] (patient's cell)   Katrina Rivera's cell  [phone]      Caffeine Use: 1 cups daily.Right handed.Disabled.Education - high schoolPatient has two adult children.   Social Drivers of Corporate investment banker Strain: Not on file  Food Insecurity: No Food Insecurity (03/05/2023)   Hunger Vital Sign    Worried About Running Out of Food in the Last Year: Never true    Ran Out of Food in the Last  Year: Never true  Transportation Needs: No Transportation Needs (03/05/2023)   PRAPARE - Administrator, Civil Service (Medical): No    Lack of Transportation (Non-Medical): No  Physical Activity: Not on file  Stress: Not on file  Social Connections: Moderately Integrated (03/05/2023)   Social Connection and Isolation Panel    Frequency of Communication with Friends and Family: More than three times a week    Frequency of Social Gatherings with Friends and Family: Three times a week    Attends Religious Services: 1 to 4 times per year    Active Member of Clubs or Organizations: No    Attends Banker Meetings: Never    Marital Status: Married  Catering manager Violence: Not At Risk (03/05/2023)   Humiliation, Afraid, Rape, and Kick questionnaire    Fear of Current or  Ex-Partner: No    Emotionally Abused: No    Physically Abused: No    Sexually Abused: No    Katrina Rivera, SCHARLENE, DNP  [facility] [address] [place], [place] [address] [phone]

## 2023-08-28 NOTE — Patient Instructions (Signed)
 Continue follow-up with primary care.  Try to exercise and be active.  We are not prescribing any medications.  Monitor your weight loss, please discuss with primary care.

## 2023-09-07 DIAGNOSIS — G4700 Insomnia, unspecified: Secondary | ICD-10-CM | POA: Diagnosis not present

## 2023-09-07 DIAGNOSIS — E440 Moderate protein-calorie malnutrition: Secondary | ICD-10-CM | POA: Diagnosis not present

## 2023-09-07 DIAGNOSIS — F339 Major depressive disorder, recurrent, unspecified: Secondary | ICD-10-CM | POA: Diagnosis not present

## 2023-09-07 DIAGNOSIS — D508 Other iron deficiency anemias: Secondary | ICD-10-CM | POA: Diagnosis not present

## 2023-09-07 DIAGNOSIS — E2740 Unspecified adrenocortical insufficiency: Secondary | ICD-10-CM | POA: Diagnosis not present

## 2023-09-07 DIAGNOSIS — G259 Extrapyramidal and movement disorder, unspecified: Secondary | ICD-10-CM | POA: Diagnosis not present

## 2023-09-07 DIAGNOSIS — I10 Essential (primary) hypertension: Secondary | ICD-10-CM | POA: Diagnosis not present

## 2023-09-07 DIAGNOSIS — G35 Multiple sclerosis: Secondary | ICD-10-CM | POA: Diagnosis not present

## 2023-09-07 DIAGNOSIS — G8929 Other chronic pain: Secondary | ICD-10-CM | POA: Diagnosis not present

## 2023-09-07 DIAGNOSIS — N183 Chronic kidney disease, stage 3 unspecified: Secondary | ICD-10-CM | POA: Diagnosis not present

## 2023-09-14 ENCOUNTER — Other Ambulatory Visit

## 2023-09-14 ENCOUNTER — Other Ambulatory Visit: Payer: Self-pay | Admitting: Endocrinology

## 2023-09-14 DIAGNOSIS — E2749 Other adrenocortical insufficiency: Secondary | ICD-10-CM | POA: Diagnosis not present

## 2023-09-15 ENCOUNTER — Ambulatory Visit: Payer: Self-pay | Admitting: Endocrinology

## 2023-09-15 LAB — BASIC METABOLIC PANEL WITHOUT GFR
BUN/Creatinine Ratio: 29 (calc) — ABNORMAL HIGH (ref 6–22)
BUN: 33 mg/dL — ABNORMAL HIGH (ref 7–25)
CO2: 25 mmol/L (ref 20–32)
Calcium: 9.8 mg/dL (ref 8.6–10.4)
Chloride: 108 mmol/L (ref 98–110)
Creat: 1.14 mg/dL — ABNORMAL HIGH (ref 0.60–1.00)
Glucose, Bld: 95 mg/dL (ref 65–99)
Potassium: 5.1 mmol/L (ref 3.5–5.3)
Sodium: 139 mmol/L (ref 135–146)

## 2023-09-18 ENCOUNTER — Encounter: Payer: Self-pay | Admitting: Endocrinology

## 2023-09-18 ENCOUNTER — Ambulatory Visit: Admitting: Endocrinology

## 2023-09-18 VITALS — BP 120/60 | HR 73 | Resp 20 | Ht 60.0 in | Wt 87.8 lb

## 2023-09-18 DIAGNOSIS — E2749 Other adrenocortical insufficiency: Secondary | ICD-10-CM

## 2023-09-18 DIAGNOSIS — E673 Hypervitaminosis D: Secondary | ICD-10-CM | POA: Diagnosis not present

## 2023-09-18 DIAGNOSIS — I951 Orthostatic hypotension: Secondary | ICD-10-CM

## 2023-09-18 NOTE — Progress Notes (Signed)
 Outpatient Endocrinology Note [place] Lynda Capistran, MD  09/18/23  Patient's Name: Katrina Rivera    DOB: March 19, 1945    MRN: [id number]  REASON OF VISIT: Follow up of  adrenal insufficiency / dysautonomic orthostatic hypotension/hypercalcemia.  PCP: Arloa Elsie SAUNDERS, MD  HISTORY OF PRESENT ILLNESS:   Katrina Rivera is a 78 y.o. old female with past medical history listed below, is here for follow up of  adrenal insufficiency / dysautonomic orthostatic hypotension / hypercalcemia..   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  He was diagnosed with dysautonomia and had multiple other problems related to autonomic neuropathy.  She has been on Florinef  since 2004, previously she had taken 3 tablets daily along with potassium supplement.  She had tried midodrine  in the past in 2014 and this was later stopped when blood pressure increased.  Florinef  was adjusted multiple times in the past for regulating her blood pressure.  Most recently she has been taking Florinef  0.05 mg daily.  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  Prior testing included cosyntropin  stimulation test showing a stimulated cortisol level of 15.7 and baseline cortisol 3.9.  Confirmed by 24-hour urine free cortisol which was only 3.0.  She was symptomatic with weight loss, decreased appetite, nausea and diarrhea.  Cosyntropin  stimulation test in February 2016 showed baseline cortisol level of 0.3 and post injection of 1.1 only. She had GI effects from hydrocortisone  and prednisone, was in the past using hydrocortisone  injection using insulin  syringe. Most recently she has been using methylprednisolone  twice a day 4 mg in the morning and 2 mg in the afternoon, previously not able to taper the dose down.  Hypercalcemia: She has intermittent hypercalcemia since at least 2018, her PTH level was 31 previously and subsequent 23  and 38.  1, 25 vitamin D  level was normal.  Serum calcium  was as high as 11.2 and mostly intermittently mildly elevated.  She has been taking calcium  and vitamin D  supplement for osteoporosis.  # Osteoporosis managed by orthopedic surgeon/primary care provider on Prolia  60 mg every 6 months.  Other: Patient has multiple sclerosis.  Interval history  Patient recently started on megestrol to help with appetite due to weight loss.  She has not noticed much improvement on appetite so far.  She has been taking glucocorticoid supplement for adrenal insufficiency.  Patient laboratory results with normal electrolytes, serum sodium and potassium.  She has elevated BUN and creatinine likely related to dehydration.  She has been taking methylprednisolone  4 mg in the morning and 2 mg in the afternoon.  She has also been taking Florinef  0.05 mg daily.  Megestrol may be associated with hypothalamic-pituitary-adrenal (HPA)-axis suppression that may be difficult to differentiate due to comorbid conditions or concurrent therapies (Ref). HPA-axis suppression may be life threatening, but discontinuation of megestrol and/or treatment with a corticosteroid typically resolves symptoms (Ref). Symptom resolution in case reports varies from days to months (Ref). Mechanism: Dose- and time-related; megestrol activates both progesterone and glucocorticoid receptors (GR) and has a stronger binding affinity than its endogenous counterparts (Ref). Dual agonist/antagonist behavior is exhibited by megestrol binding to the GR as a weak agonist, then simultaneously blocking endogenous glucocorticoids to the GR (Ref).  REVIEW OF SYSTEMS:  As per history of present illness.   PAST MEDICAL HISTORY: Past Medical History:  Diagnosis Date   Addison's disease (HCC)    takes Solu Cortef  daily   Anemia    takes Ferrous Sulfate   daily   Anxiety    takes Xanax  nightly   Arthritis    Chronic back pain    stenosis   Depression     takes Cymbalta  daily   Dizziness    if b/p drops    Fibromyalgia    History of blood transfusion    no abnormal reaction noted   History of bronchitis    many yrs ago    Hypokalemia    takes Potassium daily   Hypotension    takes Florinef  daily   IBS (irritable bowel syndrome)    takes Align daily   Insomnia    takes Trazodone  nightly   Joint pain    Multiple sclerosis (HCC)    doesn't take any meds   Multiple sclerosis (HCC)    Nausea vomiting and diarrhea 03/04/2023   New onset headache 12/22/2021   Nocturia    Osteoporosis    Palpitations    Peripheral neuropathy    Primary localized osteoarthritis of left knee 08/11/2020   Restless leg syndrome    Seasonal allergies    takes Zyrtec daily;uses Flonase  daily as needed   Syncope    when missed methylprednisolone  dose   Urinary frequency    takes Flomax  daily   Weakness    numbness and tingling    PAST SURGICAL HISTORY: Past Surgical History:  Procedure Laterality Date   ABDOMINAL HYSTERECTOMY  02/07/1986   APPENDECTOMY  02/07/1986   CESAREAN SECTION  1973/1977   x2   CHOLECYSTECTOMY  02/08/1995   COLECTOMY  02/08/1988   COLONOSCOPY     ESOPHAGOGASTRODUODENOSCOPY     EYE SURGERY     bilateral - /w IOL- cataracts   FRACTURE SURGERY Right    rods and screws-right leg   LUMBAR LAMINECTOMY/DECOMPRESSION MICRODISCECTOMY Left 01/24/2013   Procedure: LUMBAR FIVE TO SACRAL ONE LUMBAR LAMINECTOMY/DECOMPRESSION MICRODISCECTOMY 1 LEVEL;  Surgeon: Alm GORMAN Molt, MD;  Location: [facility];  Service: Neurosurgery;  Laterality: Left;   MAXIMUM ACCESS (MAS)POSTERIOR LUMBAR INTERBODY FUSION (PLIF) 1 LEVEL N/A 06/18/2014   Procedure: MAXIMUM ACCESS SURGERY POSTERIOR LUMBAR INTERBODY FUSION LUMBAR FIVE TO SACRAL ONE ;  Surgeon: Alm GORMAN Molt, MD;  Location: [facility];  Service: Neurosurgery;  Laterality: N/A;   SPINAL CORD STIMULATOR BATTERY EXCHANGE Right 07/30/2021   Procedure: Spinal cord stimulator battery replacement;   Surgeon: Molt Alm GORMAN, MD;  Location: [facility] OR;  Service: Neurosurgery;  Laterality: Right;   SPINAL CORD STIMULATOR IMPLANT     TOTAL KNEE ARTHROPLASTY Left 08/24/2020   Procedure: TOTAL KNEE ARTHROPLASTY;  Surgeon: Jane Charleston, MD;  Location: [facility];  Service: Orthopedics;  Laterality: Left;    ALLERGIES: Allergies  Allergen Reactions   Amoxicillin Other (See Comments)    Upset stomach   Aspirin      bruising   Azithromycin     stomach upset   Doxycycline Hyclate     GI upset   Hydroxyzine     Other Reaction(s): Hallucinations, Other   Hydroxyzine Hcl Other (See Comments)   Buspirone Palpitations   Buspirone Hcl Palpitations   Mirtazapine Palpitations    Weight gain    FAMILY HISTORY:  Family History  Problem Relation Age of Onset   Hypertension Mother    Stroke Mother    Heart attack Father    Tremor Brother     SOCIAL HISTORY: Social History   Socioeconomic History   Marital status: Married    Spouse name: [name]   Number of children: 2  Years of education: 23   Highest education level: Not on file  Occupational History    Employer: DISABLED    Comment: Disabled  Tobacco Use   Smoking status: Never   Smokeless tobacco: Never  Vaping Use   Vaping status: Never Used  Substance and Sexual Activity   Alcohol use: Never   Drug use: Never   Sexual activity: Not on file  Other Topics Concern   Not on file  Social History Narrative   Pt lives at home with spouse. Blondie) [phone] (patient's cell)   [name]'s cell  [phone]      Caffeine Use: 1 cups daily.Right handed.Disabled.Education - high schoolPatient has two adult children.   Social Drivers of Corporate investment banker Strain: Not on file  Food Insecurity: No Food Insecurity (03/05/2023)   Hunger Vital Sign    Worried About Running Out of Food in the Last Year: Never true    Ran Out of Food in the Last Year: Never true  Transportation Needs: No Transportation Needs (03/05/2023)   PRAPARE -  Administrator, Civil Service (Medical): No    Lack of Transportation (Non-Medical): No  Physical Activity: Not on file  Stress: Not on file  Social Connections: Moderately Integrated (03/05/2023)   Social Connection and Isolation Panel    Frequency of Communication with Friends and Family: More than three times a week    Frequency of Social Gatherings with Friends and Family: Three times a week    Attends Religious Services: 1 to 4 times per year    Active Member of Clubs or Organizations: No    Attends Banker Meetings: Never    Marital Status: Married    MEDICATIONS:  Current Outpatient Medications  Medication Sig Dispense Refill   acetaminophen  (TYLENOL ) 325 MG tablet Take 1-2 tablets (325-650 mg total) by mouth every 4 (four) hours as needed for mild pain.     alfuzosin  (UROXATRAL ) 10 MG 24 hr tablet Take 10 mg by mouth daily with breakfast.     bethanechol (URECHOLINE) 10 MG tablet Take 10 mg by mouth 2 (two) times daily.     buPROPion  (WELLBUTRIN  XL) 300 MG 24 hr tablet take 1 tablet in the morning Orally Once a day; Duration: 90     cholecalciferol  (VITAMIN D3) 25 MCG (1000 UNIT) tablet Take 1,000 Units by mouth every morning.     CRANBERRY PO Take 1 tablet by mouth 2 (two) times daily.     escitalopram  (LEXAPRO ) 5 MG tablet Take 5 mg by mouth daily.     fludrocortisone  (FLORINEF ) 0.1 MG tablet Take 1/2 tablet daily. 45 tablet 3   fluticasone  (CUTIVATE ) 0.05 % cream Apply 1 application  topically 2 (two) times daily as needed for irritation (Rosacea).     fluticasone  (FLONASE ) 50 MCG/ACT nasal spray Place 1 spray into both nostrils daily as needed for allergies or rhinitis.     Gabapentin  Enacarbil (HORIZANT ) 600 MG TBCR 1 tablet in the evening (5pm) with food Orally Once a day     HORIZANT  600 MG TBCR SMARTSIG:1 Tablet(s) By Mouth Every Evening     magnesium  gluconate (MAGONATE) 500 MG tablet Magnesium      megestrol (MEGACE) 40 MG/ML suspension 10  milliliters Orally Once a day; Duration: 30 days     methylPREDNISolone  (MEDROL ) 4 MG tablet as directed Orally 1 tablet AM and 1/2 tablet afternoon and take additional in case of illness as instructed. 160 tablet 3   Multiple  Vitamin (MULTIVITAMIN) tablet Take 1 tablet by mouth in the morning.     mupirocin ointment (BACTROBAN) 2 % Apply 1 Application topically at bedtime.     ondansetron  (ZOFRAN ) 4 MG tablet Take 1 tablet (4 mg total) by mouth every 8 (eight) hours as needed for nausea or vomiting. 90 tablet 3   Probiotic Product (PROBIOTIC PO) Take 1 capsule by mouth every morning.     tiZANidine  (ZANAFLEX ) 2 MG tablet Take 2 tablets (4 mg total) by mouth every 8 (eight) hours as needed for muscle spasms. 60 tablet 1   albuterol  (VENTOLIN  HFA) 108 (90 Base) MCG/ACT inhaler Inhale 2 puffs into the lungs every 6 (six) hours as needed for wheezing. (Patient not taking: Reported on 09/18/2023)     amLODipine  (NORVASC ) 2.5 MG tablet Take 1 tablet (2.5 mg total) by mouth daily. (Patient not taking: Reported on 09/18/2023) 30 tablet 0   clonazePAM  (KLONOPIN ) 0.5 MG tablet Take 1 tablet (0.5 mg total) by mouth at bedtime as needed for anxiety. (Patient not taking: Reported on 09/18/2023) 30 tablet 1   dicyclomine  (BENTYL ) 20 MG tablet Take 1 tablet by mouth 4 (four) times daily as needed for spasms. (Patient not taking: Reported on 09/18/2023)     ferrous sulfate  325 (65 FE) MG tablet Take 325 mg by mouth daily with breakfast. (Patient not taking: Reported on 09/18/2023)     fluconazole  (DIFLUCAN ) 100 MG tablet Take 200 mg x1 and then  100 mg daily- for thrush for an additional 6 days-for thrush (Patient not taking: Reported on 09/18/2023) 8 tablet 0   HYDROcodone -acetaminophen  (NORCO/VICODIN) 5-325 MG tablet Take 1 tablet by mouth every 8 (eight) hours as needed for moderate pain (pain score 4-6). (Patient not taking: Reported on 09/18/2023)     ondansetron  (ZOFRAN ) 4 MG tablet Take 1 tablet (4 mg total) by mouth  every 8 (eight) hours as needed for nausea or vomiting. (Patient not taking: Reported on 09/18/2023) 20 tablet 0   pantoprazole  (PROTONIX ) 20 MG tablet Take 20 mg by mouth every morning. (Patient not taking: Reported on 09/18/2023)     Polyethyl Glycol-Propyl Glycol (SYSTANE OP) Place 2 drops into both eyes daily as needed (for dry eyes). (Patient not taking: Reported on 09/18/2023)     polyethylene glycol (MIRALAX ) 17 g packet Take 17 g by mouth 2 (two) times daily. 17 grams in 6 oz of favorite drink twice a day until bowel movement.  LAXITIVE.  Restart if two days since last bowel movement (Patient not taking: Reported on 09/18/2023) 14 packet 0   SSD 1 % cream Apply 1 Application topically daily. (Patient not taking: Reported on 08/28/2023)     topiramate  (TOPAMAX ) 50 MG tablet Take 1 tablet (50 mg total) by mouth at bedtime. X 1 week, then 75 mg nightly x 1 week, ,then 100 mg at bedtime- for prevention of headaches (Patient not taking: Reported on 09/18/2023) 60 tablet 5   No current facility-administered medications for this visit.    PHYSICAL EXAM: Vitals:   09/18/23 1101  BP: 120/60  Pulse: 73  Resp: 20  SpO2: 97%  Weight: 87 lb 12.8 oz (39.8 kg)  Height: 5' (1.524 m)    Body mass index is 17.15 kg/m.   General: Well developed, well nourished female in no apparent distress. HEENT: AT/[place], no external lesions. Hearing intact to the spoken word Eyes: Conjunctiva clear and no icterus. Neck: Trachea midline, neck supple  Abdomen: Soft, non tender Neurologic: Alert, oriented, normal speech  Extremities: no pedal pitting edema.   Skin: Warm, color good.  Psychiatric: Does not appear depressed or anxious  PERTINENT HISTORIC LABORATORY AND IMAGING STUDIES:  All pertinent laboratory results were reviewed. Please see HPI for further details.  Lab Results  Component Value Date   CO2 25 09/14/2023   CL 108 09/14/2023   NA 139 09/14/2023   GLUCOSE 95 09/14/2023   BUN 33 (H) 09/14/2023    No components found for: CORTRAND, CORTISOL TOTAL AM, ALDOSTERONE, RENIN ACTIVITY, DEHYDROEPIANDROSTERONE SULFATE, CATECHOLAMINES FRACTIONATED    ASSESSMENT / PLAN  1. Secondary adrenal insufficiency (HCC)   2. Dysautonomia orthostatic hypotension syndrome   3. Hypercalcemia   4. Hypervitaminosis D     # Patient has longstanding history of secondary adrenal insufficiency. -Continue methylprednisolone  4 mg in the morning and 2 mg in the late afternoon.  Which is equivalent to taking hydrocortisone  around 30 mg daily. -Discussed illness rule out for steroid to take 2-3 times the usual dose in case of illness.  Advised to wear medical alert for adrenal insufficiency bracelet or necklace.  # Patient has been taking Megestrol to help with appetite due to weight loss.  Megestrol is a synthetic progestin /similar to progesterone female hormone.  It can activate progesterone and glucocorticoid receptors ( GR).  It can suppress hypothalamic pituitary adrenal axis and can cause adrenal insufficiency.  However patient already has adrenal insufficiency on appropriate glucocorticoid replacement.  Dual agonist/antagonist behavior is exhibited by megestrol binding to the GR as a weak agonist, then simultaneously blocking endogenous glucocorticoids to the GR, can potentially induce adrenal insufficiency.  Okay to continue megestrol as she has been treated with glucocorticoid for adrenal insufficiency.  However need to watch for adrenal insufficiency symptoms which includes sickness with nausea, vomiting and low blood pressure like lightheadedness etc.   # Patient has longstanding history of orthostatic dysautonomic hypotension syndrome. -Continue Florinef  0.05 mg daily. -Advised to monitor blood pressure including standing blood pressure at home.  # Hypercalcemia -Mild hypercalcemia intermittently.  PTH has been normal. -She has osteoporosis on Prolia  managed by orthopedic surgeon/primary  care provider. -She had high vitamin D  level, hypercalcemia can also be related with hypervitaminosis D.  She is taking multivitamin containing vitamin D  1 tablet daily.  Will continue to monitor.  Advised for no additional vitamin D  supplement.   Diagnoses and all orders for this visit:  Secondary adrenal insufficiency (HCC)  Dysautonomia orthostatic hypotension syndrome  Hypercalcemia  Hypervitaminosis D    DISPOSITION Follow up in clinic in 4 months suggested.  All questions answered and patient verbalized understanding of the plan.  [place] Margalit Leece, MD [facility] Endocrinology [facility] [address] [place], [address] [place], [place] [address] Phone # [phone]  At least part of this note was generated using voice recognition software. Inadvertent word errors may have occurred, which were not recognized during the proofreading process.

## 2023-09-20 ENCOUNTER — Ambulatory Visit: Payer: PPO | Admitting: Physical Medicine and Rehabilitation

## 2023-09-27 DIAGNOSIS — L308 Other specified dermatitis: Secondary | ICD-10-CM | POA: Diagnosis not present

## 2023-09-27 DIAGNOSIS — L942 Calcinosis cutis: Secondary | ICD-10-CM | POA: Diagnosis not present

## 2023-10-24 ENCOUNTER — Other Ambulatory Visit: Payer: Self-pay | Admitting: Neurological Surgery

## 2023-10-24 DIAGNOSIS — S32039D Unspecified fracture of third lumbar vertebra, subsequent encounter for fracture with routine healing: Secondary | ICD-10-CM | POA: Diagnosis not present

## 2023-10-26 ENCOUNTER — Inpatient Hospital Stay
Admission: RE | Admit: 2023-10-26 | Discharge: 2023-10-26 | Discharge status: Home / Self Care | Source: Ambulatory Visit | Attending: Neurological Surgery | Admitting: Neurological Surgery

## 2023-10-26 DIAGNOSIS — M48061 Spinal stenosis, lumbar region without neurogenic claudication: Secondary | ICD-10-CM | POA: Diagnosis not present

## 2023-10-26 DIAGNOSIS — M47816 Spondylosis without myelopathy or radiculopathy, lumbar region: Secondary | ICD-10-CM | POA: Diagnosis not present

## 2023-10-26 DIAGNOSIS — S32039D Unspecified fracture of third lumbar vertebra, subsequent encounter for fracture with routine healing: Secondary | ICD-10-CM

## 2023-10-31 DIAGNOSIS — N183 Chronic kidney disease, stage 3 unspecified: Secondary | ICD-10-CM | POA: Diagnosis not present

## 2023-10-31 DIAGNOSIS — G4700 Insomnia, unspecified: Secondary | ICD-10-CM | POA: Diagnosis not present

## 2023-10-31 DIAGNOSIS — E440 Moderate protein-calorie malnutrition: Secondary | ICD-10-CM | POA: Diagnosis not present

## 2023-10-31 DIAGNOSIS — F339 Major depressive disorder, recurrent, unspecified: Secondary | ICD-10-CM | POA: Diagnosis not present

## 2023-10-31 DIAGNOSIS — G8929 Other chronic pain: Secondary | ICD-10-CM | POA: Diagnosis not present

## 2023-10-31 DIAGNOSIS — I10 Essential (primary) hypertension: Secondary | ICD-10-CM | POA: Diagnosis not present

## 2023-10-31 DIAGNOSIS — Z5181 Encounter for therapeutic drug level monitoring: Secondary | ICD-10-CM | POA: Diagnosis not present

## 2023-10-31 DIAGNOSIS — G35 Multiple sclerosis: Secondary | ICD-10-CM | POA: Diagnosis not present

## 2023-10-31 DIAGNOSIS — G259 Extrapyramidal and movement disorder, unspecified: Secondary | ICD-10-CM | POA: Diagnosis not present

## 2023-10-31 DIAGNOSIS — E2740 Unspecified adrenocortical insufficiency: Secondary | ICD-10-CM | POA: Diagnosis not present

## 2023-10-31 DIAGNOSIS — D508 Other iron deficiency anemias: Secondary | ICD-10-CM | POA: Diagnosis not present

## 2023-11-08 DIAGNOSIS — L308 Other specified dermatitis: Secondary | ICD-10-CM | POA: Diagnosis not present

## 2023-11-08 DIAGNOSIS — L942 Calcinosis cutis: Secondary | ICD-10-CM | POA: Diagnosis not present

## 2023-11-09 DIAGNOSIS — M5126 Other intervertebral disc displacement, lumbar region: Secondary | ICD-10-CM | POA: Diagnosis not present

## 2023-11-09 DIAGNOSIS — M5416 Radiculopathy, lumbar region: Secondary | ICD-10-CM | POA: Diagnosis not present

## 2023-11-14 ENCOUNTER — Encounter: Payer: Self-pay | Admitting: Neurology

## 2023-11-14 ENCOUNTER — Encounter: Payer: Self-pay | Admitting: Endocrinology

## 2023-11-14 NOTE — Telephone Encounter (Signed)
 You are on long-term steroid for adrenal insufficiency, need to maintain on a steroid/methylprednisolone .  Current dose of her methylprednisolone  is on the high normal side.  We can consider to decrease the dose slightly, can be discussed and make a plan to decrease in the follow-up visit in December.

## 2023-11-15 NOTE — Telephone Encounter (Signed)
 I called the patient. Having ESI with Dr. Joshua on 10/14 to lumbar spine. Since fall in Nov 2024, has had headache to bilateral occipital area, radiates forward, tension in her neck and shoulder. Back of head is sensitive to touch, pillow. She doesn't want to try any medications, We have tried multiple medications in the past including Aimovig , gabapentin , Cymbalta , Topamax . We discussed trying physical therapy for the neck and shoulders. She wants to wait until after ESI, then reach back out to me.

## 2023-11-21 DIAGNOSIS — M5416 Radiculopathy, lumbar region: Secondary | ICD-10-CM | POA: Diagnosis not present

## 2023-11-29 DIAGNOSIS — M79642 Pain in left hand: Secondary | ICD-10-CM | POA: Diagnosis not present

## 2023-11-29 DIAGNOSIS — L718 Other rosacea: Secondary | ICD-10-CM | POA: Diagnosis not present

## 2023-11-29 DIAGNOSIS — M158 Other polyosteoarthritis: Secondary | ICD-10-CM | POA: Diagnosis not present

## 2023-11-29 DIAGNOSIS — M151 Heberden's nodes (with arthropathy): Secondary | ICD-10-CM | POA: Diagnosis not present

## 2023-11-29 DIAGNOSIS — Z96652 Presence of left artificial knee joint: Secondary | ICD-10-CM | POA: Diagnosis not present

## 2023-11-29 DIAGNOSIS — M79641 Pain in right hand: Secondary | ICD-10-CM | POA: Diagnosis not present

## 2023-12-01 DIAGNOSIS — R399 Unspecified symptoms and signs involving the genitourinary system: Secondary | ICD-10-CM | POA: Diagnosis not present

## 2023-12-01 DIAGNOSIS — J329 Chronic sinusitis, unspecified: Secondary | ICD-10-CM | POA: Diagnosis not present

## 2023-12-01 DIAGNOSIS — F419 Anxiety disorder, unspecified: Secondary | ICD-10-CM | POA: Diagnosis not present

## 2023-12-12 DIAGNOSIS — M5416 Radiculopathy, lumbar region: Secondary | ICD-10-CM | POA: Diagnosis not present

## 2023-12-13 ENCOUNTER — Other Ambulatory Visit: Payer: Self-pay | Admitting: Neurological Surgery

## 2023-12-13 DIAGNOSIS — H04123 Dry eye syndrome of bilateral lacrimal glands: Secondary | ICD-10-CM | POA: Diagnosis not present

## 2023-12-15 ENCOUNTER — Other Ambulatory Visit: Payer: Self-pay

## 2023-12-15 ENCOUNTER — Other Ambulatory Visit

## 2023-12-15 DIAGNOSIS — E2749 Other adrenocortical insufficiency: Secondary | ICD-10-CM | POA: Diagnosis not present

## 2023-12-15 LAB — BASIC METABOLIC PANEL WITHOUT GFR
BUN: 25 mg/dL (ref 7–25)
CO2: 26 mmol/L (ref 20–32)
Calcium: 9.8 mg/dL (ref 8.6–10.4)
Chloride: 100 mmol/L (ref 98–110)
Creat: 1 mg/dL (ref 0.60–1.00)
Glucose, Bld: 106 mg/dL — ABNORMAL HIGH (ref 65–99)
Potassium: 4.7 mmol/L (ref 3.5–5.3)
Sodium: 134 mmol/L — ABNORMAL LOW (ref 135–146)

## 2023-12-16 ENCOUNTER — Ambulatory Visit: Payer: Self-pay | Admitting: Endocrinology

## 2023-12-18 ENCOUNTER — Encounter: Payer: Self-pay | Admitting: Endocrinology

## 2023-12-18 ENCOUNTER — Ambulatory Visit: Admitting: Endocrinology

## 2023-12-18 VITALS — BP 120/72 | HR 68 | Resp 20 | Ht 60.0 in | Wt 97.0 lb

## 2023-12-18 DIAGNOSIS — I951 Orthostatic hypotension: Secondary | ICD-10-CM

## 2023-12-18 DIAGNOSIS — E2749 Other adrenocortical insufficiency: Secondary | ICD-10-CM | POA: Diagnosis not present

## 2023-12-18 MED ORDER — METHYLPREDNISOLONE 4 MG PO TABS: ORAL_TABLET | ORAL | 3 refills | Status: AC

## 2023-12-18 MED ORDER — FLUDROCORTISONE ACETATE 0.1 MG PO TABS: ORAL_TABLET | ORAL | 3 refills | Status: AC

## 2023-12-18 NOTE — Progress Notes (Signed)
 Outpatient Endocrinology Note Katrina Mcgonagle, MD  12/18/23  Patient's Name: Katrina Rivera    DOB: Aug 21, 1945    MRN: [id number]  REASON OF VISIT: Follow up of  adrenal insufficiency / dysautonomic orthostatic hypotension/hypercalcemia.  PCP: Katrina Elsie SAUNDERS, MD  HISTORY OF PRESENT ILLNESS:   Katrina Rivera is a 78 y.o. old female with past medical history listed below, is here for follow up of  adrenal insufficiency / dysautonomic orthostatic hypotension / hypercalcemia..   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  He was diagnosed with dysautonomia and had multiple other problems related to autonomic neuropathy.  She has been on Florinef  since 2004, previously she had taken 3 tablets daily along with potassium supplement.  She had tried midodrine  in the past in 2014 and this was later stopped when blood pressure increased.  Florinef  was adjusted multiple times in the past for regulating her blood pressure.  Most recently she has been taking Florinef  0.05 mg daily.  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  Prior testing included cosyntropin  stimulation test showing a stimulated cortisol level of 15.7 and baseline cortisol 3.9.  Confirmed by 24-hour urine free cortisol which was only 3.0.  She was symptomatic with weight loss, decreased appetite, nausea and diarrhea.  Cosyntropin  stimulation test in February 2016 showed baseline cortisol level of 0.3 and post injection of 1.1 only. She had GI effects from hydrocortisone  and prednisone, was in the past using hydrocortisone  injection using insulin  syringe. Most recently she has been using methylprednisolone  twice a day 4 mg in the morning and 2 mg in the afternoon, previously not able to taper the dose down.  Hypercalcemia: She has intermittent hypercalcemia since at least 2018, her PTH level was 31 previously and subsequent 23  and 38.  1, 25 vitamin D  level was normal.  Serum calcium  was as high as 11.2 and mostly intermittently mildly elevated.  She has been taking calcium  and vitamin D  supplement for osteoporosis.  # Osteoporosis managed by orthopedic surgeon/primary care provider on Prolia  60 mg every 6 months.  Other: Patient has multiple sclerosis.  Interval history Patient has been taking methylprednisolone  4 mg in the morning and 2 mg in the late afternoon.  She has also been taking Florinef  half tablet daily.  Recent labs with acceptable serum sodium of 134 mildly low, normal serum potassium, serum calcium  and creatinine.  She has been taking megestrol for appetite, she reports has been eating better.  There is a plan for back surgery on November 21.  REVIEW OF SYSTEMS:  As per history of present illness.   PAST MEDICAL HISTORY: Past Medical History:  Diagnosis Date   Addison's disease (HCC)    takes Solu Cortef  daily   Anemia    takes Ferrous Sulfate  daily   Anxiety    takes Xanax  nightly   Arthritis    Chronic back pain    stenosis   Depression    takes Cymbalta  daily   Dizziness    if b/p drops    Fibromyalgia    History of blood transfusion    no abnormal reaction noted   History of bronchitis    many yrs ago    Hypokalemia    takes Potassium daily   Hypotension    takes Florinef  daily   IBS (irritable bowel syndrome)    takes Align daily   Insomnia    takes Trazodone  nightly   Joint pain  Multiple sclerosis    doesn't take any meds   Multiple sclerosis    Nausea vomiting and diarrhea 03/04/2023   New onset headache 12/22/2021   Nocturia    Osteoporosis    Palpitations    Peripheral neuropathy    Primary localized osteoarthritis of left knee 08/11/2020   Restless leg syndrome    Seasonal allergies    takes Zyrtec daily;uses Flonase  daily as needed   Syncope    when missed methylprednisolone  dose   Urinary frequency    takes Flomax  daily   Weakness    numbness  and tingling    PAST SURGICAL HISTORY: Past Surgical History:  Procedure Laterality Date   ABDOMINAL HYSTERECTOMY  02/07/1986   APPENDECTOMY  02/07/1986   CESAREAN SECTION  1973/1977   x2   CHOLECYSTECTOMY  02/08/1995   COLECTOMY  02/08/1988   COLONOSCOPY     ESOPHAGOGASTRODUODENOSCOPY     EYE SURGERY     bilateral - /w IOL- cataracts   FRACTURE SURGERY Right    rods and screws-right leg   LUMBAR LAMINECTOMY/DECOMPRESSION MICRODISCECTOMY Left 01/24/2013   Procedure: LUMBAR FIVE TO SACRAL ONE LUMBAR LAMINECTOMY/DECOMPRESSION MICRODISCECTOMY 1 LEVEL;  Surgeon: Alm GORMAN Molt, MD;  Location: [facility];  Service: Neurosurgery;  Laterality: Left;   MAXIMUM ACCESS (MAS)POSTERIOR LUMBAR INTERBODY FUSION (PLIF) 1 LEVEL N/A 06/18/2014   Procedure: MAXIMUM ACCESS SURGERY POSTERIOR LUMBAR INTERBODY FUSION LUMBAR FIVE TO SACRAL ONE ;  Surgeon: Alm GORMAN Molt, MD;  Location: [facility];  Service: Neurosurgery;  Laterality: N/A;   SPINAL CORD STIMULATOR BATTERY EXCHANGE Right 07/30/2021   Procedure: Spinal cord stimulator battery replacement;  Surgeon: Molt Alm GORMAN, MD;  Location: [facility] OR;  Service: Neurosurgery;  Laterality: Right;   SPINAL CORD STIMULATOR IMPLANT     TOTAL KNEE ARTHROPLASTY Left 08/24/2020   Procedure: TOTAL KNEE ARTHROPLASTY;  Surgeon: Jane Charleston, MD;  Location: [facility];  Service: Orthopedics;  Laterality: Left;    ALLERGIES: Allergies  Allergen Reactions   Amoxicillin Other (See Comments)    Upset stomach   Aspirin      bruising   Azithromycin     stomach upset   Doxycycline Hyclate     GI upset   Hydroxyzine     Other Reaction(s): Hallucinations, Other   Hydroxyzine Hcl Other (See Comments)   Buspirone Palpitations   Buspirone Hcl Palpitations   Mirtazapine Palpitations    Weight gain    FAMILY HISTORY:  Family History  Problem Relation Age of Onset   Hypertension Mother    Stroke Mother    Heart attack Father    Tremor Brother     SOCIAL  HISTORY: Social History   Socioeconomic History   Marital status: Married    Spouse name: [name]   Number of children: 2   Years of education: 12   Highest education level: Not on file  Occupational History    Employer: DISABLED    Comment: Disabled  Tobacco Use   Smoking status: Never   Smokeless tobacco: Never  Vaping Use   Vaping status: Never Used  Substance and Sexual Activity   Alcohol use: Never   Drug use: Never   Sexual activity: Not on file  Other Topics Concern   Not on file  Social History Narrative   Pt lives at home with spouse. Blondie) [phone] (patient's cell)   [name]'s cell  [phone]      Caffeine Use: 1 cups daily.Right handed.Disabled.Education - high schoolPatient has two adult children.  Social Drivers of Corporate Investment Banker Strain: Not on file  Food Insecurity: No Food Insecurity (03/05/2023)   Hunger Vital Sign    Worried About Running Out of Food in the Last Year: Never true    Ran Out of Food in the Last Year: Never true  Transportation Needs: No Transportation Needs (03/05/2023)   PRAPARE - Administrator, Civil Service (Medical): No    Lack of Transportation (Non-Medical): No  Physical Activity: Not on file  Stress: Not on file  Social Connections: Moderately Integrated (03/05/2023)   Social Connection and Isolation Panel    Frequency of Communication with Friends and Family: More than three times a week    Frequency of Social Gatherings with Friends and Family: Three times a week    Attends Religious Services: 1 to 4 times per year    Active Member of Clubs or Organizations: No    Attends Banker Meetings: Never    Marital Status: Married    MEDICATIONS:  Current Outpatient Medications  Medication Sig Dispense Refill   acetaminophen  (TYLENOL ) 325 MG tablet Take 1-2 tablets (325-650 mg total) by mouth every 4 (four) hours as needed for mild pain.     albuterol  (VENTOLIN  HFA) 108 (90 Base) MCG/ACT inhaler  Inhale 2 puffs into the lungs every 6 (six) hours as needed for wheezing.     alfuzosin  (UROXATRAL ) 10 MG 24 hr tablet Take 10 mg by mouth daily with breakfast.     amLODipine  (NORVASC ) 2.5 MG tablet Take 1 tablet (2.5 mg total) by mouth daily. 30 tablet 0   bethanechol (URECHOLINE) 10 MG tablet Take 10 mg by mouth 2 (two) times daily.     buPROPion  (WELLBUTRIN  XL) 300 MG 24 hr tablet take 1 tablet in the morning Orally Once a day; Duration: 90     cholecalciferol  (VITAMIN D3) 25 MCG (1000 UNIT) tablet Take 1,000 Units by mouth every morning.     clonazePAM  (KLONOPIN ) 0.5 MG tablet Take 1 tablet (0.5 mg total) by mouth at bedtime as needed for anxiety. 30 tablet 1   CRANBERRY PO Take 1 tablet by mouth 2 (two) times daily.     dicyclomine  (BENTYL ) 20 MG tablet Take 1 tablet by mouth 4 (four) times daily as needed for spasms.     escitalopram  (LEXAPRO ) 5 MG tablet Take 5 mg by mouth daily.     ferrous sulfate  325 (65 FE) MG tablet Take 325 mg by mouth daily with breakfast.     fluconazole  (DIFLUCAN ) 100 MG tablet Take 200 mg x1 and then  100 mg daily- for thrush for an additional 6 days-for thrush 8 tablet 0   fluticasone  (CUTIVATE ) 0.05 % cream Apply 1 application  topically 2 (two) times daily as needed for irritation (Rosacea).     fluticasone  (FLONASE ) 50 MCG/ACT nasal spray Place 1 spray into both nostrils daily as needed for allergies or rhinitis.     Gabapentin  Enacarbil (HORIZANT ) 600 MG TBCR 1 tablet in the evening (5pm) with food Orally Once a day     HORIZANT  600 MG TBCR SMARTSIG:1 Tablet(s) By Mouth Every Evening     HYDROcodone -acetaminophen  (NORCO/VICODIN) 5-325 MG tablet Take 1 tablet by mouth every 8 (eight) hours as needed for moderate pain (pain score 4-6).     magnesium  gluconate (MAGONATE) 500 MG tablet Magnesium      megestrol (MEGACE) 40 MG/ML suspension 10 milliliters Orally Once a day; Duration: 30 days     Multiple  Vitamin (MULTIVITAMIN) tablet Take 1 tablet by mouth in the  morning.     mupirocin ointment (BACTROBAN) 2 % Apply 1 Application topically at bedtime.     ondansetron  (ZOFRAN ) 4 MG tablet Take 1 tablet (4 mg total) by mouth every 8 (eight) hours as needed for nausea or vomiting. 90 tablet 3   ondansetron  (ZOFRAN ) 4 MG tablet Take 1 tablet (4 mg total) by mouth every 8 (eight) hours as needed for nausea or vomiting. 20 tablet 0   pantoprazole  (PROTONIX ) 20 MG tablet Take 20 mg by mouth every morning.     Polyethyl Glycol-Propyl Glycol (SYSTANE OP) Place 2 drops into both eyes daily as needed (for dry eyes).     polyethylene glycol (MIRALAX ) 17 g packet Take 17 g by mouth 2 (two) times daily. 17 grams in 6 oz of favorite drink twice a day until bowel movement.  LAXITIVE.  Restart if two days since last bowel movement 14 packet 0   Probiotic Product (PROBIOTIC PO) Take 1 capsule by mouth every morning.     SSD 1 % cream Apply 1 Application topically daily.     tiZANidine  (ZANAFLEX ) 2 MG tablet Take 2 tablets (4 mg total) by mouth every 8 (eight) hours as needed for muscle spasms. 60 tablet 1   topiramate  (TOPAMAX ) 50 MG tablet Take 1 tablet (50 mg total) by mouth at bedtime. X 1 week, then 75 mg nightly x 1 week, ,then 100 mg at bedtime- for prevention of headaches 60 tablet 5   traZODone  (DESYREL ) 100 MG tablet 1 tablet at bedtime Orally Once a day for insomnia; Duration: 30 days     fludrocortisone  (FLORINEF ) 0.1 MG tablet Take 1/2 tablet daily. 45 tablet 3   methylPREDNISolone  (MEDROL ) 4 MG tablet as directed Orally 1 tablet AM and 1/2 tablet afternoon and take additional in case of illness as instructed. 160 tablet 3   No current facility-administered medications for this visit.    PHYSICAL EXAM: Vitals:   12/18/23 1426  BP: 120/72  Pulse: 68  Resp: 20  SpO2: 98%  Weight: 97 lb (44 kg)  Height: 5' (1.524 m)    Body mass index is 18.94 kg/m.   General: Well developed, well nourished female in no apparent distress. HEENT: AT/[place], no external  lesions. Hearing intact to the spoken word Eyes: Conjunctiva clear and no icterus. Neck: Trachea midline, neck supple  Abdomen: Soft, non tender Neurologic: Alert, oriented, normal speech Extremities: no pedal pitting edema.   Skin: Warm, color good.  Psychiatric: Does not appear depressed or anxious  PERTINENT HISTORIC LABORATORY AND IMAGING STUDIES:  All pertinent laboratory results were reviewed. Please see HPI for further details.  Lab Results  Component Value Date   CO2 26 12/15/2023   CL 100 12/15/2023   NA 134 (L) 12/15/2023   GLUCOSE 106 (H) 12/15/2023   BUN 25 12/15/2023   No components found for: CORTRAND, CORTISOL TOTAL AM, ALDOSTERONE, RENIN ACTIVITY, DEHYDROEPIANDROSTERONE SULFATE, CATECHOLAMINES FRACTIONATED    ASSESSMENT / PLAN  1. Secondary adrenal insufficiency   2. Dysautonomia orthostatic hypotension syndrome   3. Hypercalcemia     # Patient has longstanding history of secondary adrenal insufficiency. -Continue methylprednisolone  4 mg in the morning and 2 mg in the late afternoon.  Which is equivalent to taking hydrocortisone  around 30 mg daily. -Discussed illness rule out for steroid to take 2-3 times the usual dose in case of illness.  Advised to wear medical alert for adrenal insufficiency bracelet or  necklace. - There is a plan for back surgery in the near future.  Recommend for stress dose of steroid/IV hydrocortisone  in perioperative period.  And advised to take double the dose of usual  dose of steroid/methylprednisolone  for 5 days after the surgery.  Steroids should be adjusted based on hemodynamic stability postoperatively.  # Patient has been taking Megestrol to help with appetite due to weight loss.  Megestrol is a synthetic progestin /similar to progesterone female hormone.  It can activate progesterone and glucocorticoid receptors ( GR).  It can suppress hypothalamic pituitary adrenal axis and can cause adrenal insufficiency.  However  patient already has adrenal insufficiency on appropriate glucocorticoid replacement.  Dual agonist/antagonist behavior is exhibited by megestrol binding to the GR as a weak agonist, then simultaneously blocking endogenous glucocorticoids to the GR, can potentially induce adrenal insufficiency.  Okay to continue megestrol as she has been treated with glucocorticoid for adrenal insufficiency.  However need to watch for adrenal insufficiency symptoms which includes sickness with nausea, vomiting and low blood pressure like lightheadedness etc.  # Patient has longstanding history of orthostatic dysautonomic hypotension syndrome. -Continue Florinef  0.05 mg daily. -Advised to monitor blood pressure including standing blood pressure at home.  # Hypercalcemia : Recent lab with normal hypercalcemia.  PTH was normal in the past.  # Patient has osteoporosis on Prolia  managed by orthopedic surgeon/primary care provider. -She had high vitamin D  level, hypercalcemia can also be related with hypervitaminosis D.  She is taking multivitamin containing vitamin D  1 tablet daily.  Will continue to monitor.  Advised for no additional vitamin D  supplement.   Diagnoses and all orders for this visit:  Secondary adrenal insufficiency -     methylPREDNISolone  (MEDROL ) 4 MG tablet; as directed Orally 1 tablet AM and 1/2 tablet afternoon and take additional in case of illness as instructed. -     fludrocortisone  (FLORINEF ) 0.1 MG tablet; Take 1/2 tablet daily.  Dysautonomia orthostatic hypotension syndrome  Hypercalcemia   DISPOSITION Follow up in clinic in 4 months suggested.  All questions answered and patient verbalized understanding of the plan.  Grant Swager, MD [facility] [address] [place], [address] [place], [place] [address] Phone # [phone]  At least part of this note was generated using voice recognition software. Inadvertent word errors may have occurred, which  were not recognized during the proofreading process.

## 2023-12-19 DIAGNOSIS — G4700 Insomnia, unspecified: Secondary | ICD-10-CM | POA: Diagnosis not present

## 2023-12-19 DIAGNOSIS — E2740 Unspecified adrenocortical insufficiency: Secondary | ICD-10-CM | POA: Diagnosis not present

## 2023-12-19 DIAGNOSIS — E440 Moderate protein-calorie malnutrition: Secondary | ICD-10-CM | POA: Diagnosis not present

## 2023-12-19 DIAGNOSIS — M810 Age-related osteoporosis without current pathological fracture: Secondary | ICD-10-CM | POA: Diagnosis not present

## 2023-12-19 DIAGNOSIS — L258 Unspecified contact dermatitis due to other agents: Secondary | ICD-10-CM | POA: Diagnosis not present

## 2023-12-19 DIAGNOSIS — I10 Essential (primary) hypertension: Secondary | ICD-10-CM | POA: Diagnosis not present

## 2023-12-19 DIAGNOSIS — F419 Anxiety disorder, unspecified: Secondary | ICD-10-CM | POA: Diagnosis not present

## 2023-12-19 DIAGNOSIS — M545 Low back pain, unspecified: Secondary | ICD-10-CM | POA: Diagnosis not present

## 2023-12-19 DIAGNOSIS — K219 Gastro-esophageal reflux disease without esophagitis: Secondary | ICD-10-CM | POA: Diagnosis not present

## 2023-12-19 DIAGNOSIS — M1990 Unspecified osteoarthritis, unspecified site: Secondary | ICD-10-CM | POA: Diagnosis not present

## 2023-12-19 DIAGNOSIS — N183 Chronic kidney disease, stage 3 unspecified: Secondary | ICD-10-CM | POA: Diagnosis not present

## 2023-12-19 DIAGNOSIS — Z23 Encounter for immunization: Secondary | ICD-10-CM | POA: Diagnosis not present

## 2023-12-22 NOTE — Pre-Procedure Instructions (Signed)
 Surgical Instructions   Your procedure is scheduled on December 29, 2023. Report to [facility] Main Entrance A at 7:30 A.M., then check in with the Admitting office. Any questions or running late day of surgery: call [phone]  Questions prior to your surgery date: call [phone], Monday-Friday, 8am-4pm. If you experience any cold or flu symptoms such as cough, fever, chills, shortness of breath, etc. between now and your scheduled surgery, please notify us  at the above number.     Remember:  Do not eat or drink after midnight the night before your surgery    Take these medicines the morning of surgery with A SIP OF WATER : Alfuzosin  (Uroxatral ) Bethanechol (Urecholine) Buspirone (Buspar) Cyclosporine (Restatsis) eye drops Esomeprazole (Nexium) Fludrocortisone  (Florinef ) Loratadine (Claritin) Methylprednisonolone (Medrol )  May take these medicines IF NEEDED: Acetaminophen  (Tylenol ) Fluticasone  (Flonase ) nasal spray Oxycodone -Acetaminophen  (Percocet/Roxicet)   One week prior to surgery, STOP taking any Aspirin  (unless otherwise instructed by your surgeon) Aleve, Naproxen, Ibuprofen, Motrin, Advil, Goody's, BC's, all herbal medications, fish oil, and non-prescription vitamins.                     Do NOT Smoke (Tobacco/Vaping) for 24 hours prior to your procedure.  If you use a CPAP at night, you may bring your mask/headgear for your overnight stay.   You will be asked to remove any contacts, glasses, piercing's, hearing aid's, dentures/partials prior to surgery. Please bring cases for these items if needed.    Patients discharged the day of surgery will not be allowed to drive home, and someone needs to stay with them for 24 hours.  SURGICAL WAITING ROOM VISITATION Patients may have no more than 2 support people in the waiting area - these visitors may rotate.   Pre-op nurse will coordinate an appropriate time for 1 ADULT support person, who may not rotate, to  accompany patient in pre-op.  Children under the age of 11 must have an adult with them who is not the patient and must remain in the main waiting area with an adult.  If the patient needs to stay at the hospital during part of their recovery, the visitor guidelines for inpatient rooms apply.  Please refer to the [facility] website for the visitor guidelines for any additional information.   If you received a COVID test during your pre-op visit  it is requested that you wear a mask when out in public, stay away from anyone that may not be feeling well and notify your surgeon if you develop symptoms. If you have been in contact with anyone that has tested positive in the last 10 days please notify you surgeon.      Pre-operative 4 CHG Bathing Instructions   You can play a key role in reducing the risk of infection after surgery. Your skin needs to be as free of germs as possible. You can reduce the number of germs on your skin by washing with CHG (chlorhexidine  gluconate) soap before surgery. CHG is an antiseptic soap that kills germs and continues to kill germs even after washing.   DO NOT use if you have an allergy to chlorhexidine /CHG or antibacterial soaps. If your skin becomes reddened or irritated, stop using the CHG and notify one of our RNs at [phone].   Please shower with the CHG soap starting 4 days before surgery using the following schedule:     Please keep in mind the following:  DO NOT shave, including legs and underarms, starting the  day of your first shower.   You may shave your face at any point before/day of surgery.  Place clean sheets on your bed the day you start using CHG soap. Use a clean washcloth (not used since being washed) for each shower. DO NOT sleep with pets once you start using the CHG.   CHG Shower Instructions:  Wash your face and private area with normal soap. If you choose to wash your hair, wash first with your normal shampoo.  After you use  shampoo/soap, rinse your hair and body thoroughly to remove shampoo/soap residue.  Turn the water OFF and apply  bottle of CHG soap to a CLEAN washcloth.  Apply CHG soap ONLY FROM YOUR NECK DOWN TO YOUR TOES (washing for 3-5 minutes)  DO NOT use CHG soap on face, private areas, open wounds, or sores.  Pay special attention to the area where your surgery is being performed.  If you are having back surgery, having someone wash your back for you may be helpful. Wait 2 minutes after CHG soap is applied, then you may rinse off the CHG soap.  Pat dry with a clean towel  Put on clean clothes/pajamas   If you choose to wear lotion, please use ONLY the CHG-compatible lotions that are listed below.  Additional instructions for the day of surgery:  If you choose, you may shower the morning of surgery with an antibacterial soap.  DO NOT APPLY any lotions, deodorants, cologne, or perfumes.   Do not bring valuables to the hospital. [facility] is not responsible for any belongings/valuables. Do not wear nail polish, gel polish, artificial nails, or any other type of covering on natural nails (fingers and toes) Do not wear jewelry or makeup Put on clean/comfortable clothes.  Please brush your teeth.  Ask your nurse before applying any prescription medications to the skin.     CHG Compatible Lotions   Aveeno Moisturizing lotion  Cetaphil Moisturizing Cream  Cetaphil Moisturizing Lotion  Clairol Herbal Essence Moisturizing Lotion, Dry Skin  Clairol Herbal Essence Moisturizing Lotion, Extra Dry Skin  Clairol Herbal Essence Moisturizing Lotion, Normal Skin  Curel Age Defying Therapeutic Moisturizing Lotion with Alpha Hydroxy  Curel Extreme Care Body Lotion  Curel Soothing Hands Moisturizing Hand Lotion  Curel Therapeutic Moisturizing Cream, Fragrance-Free  Curel Therapeutic Moisturizing Lotion, Fragrance-Free  Curel Therapeutic Moisturizing Lotion, Original Formula  Eucerin Daily Replenishing  Lotion  Eucerin Dry Skin Therapy Plus Alpha Hydroxy Crme  Eucerin Dry Skin Therapy Plus Alpha Hydroxy Lotion  Eucerin Original Crme  Eucerin Original Lotion  Eucerin Plus Crme Eucerin Plus Lotion  Eucerin TriLipid Replenishing Lotion  Keri Anti-Bacterial Hand Lotion  Keri Deep Conditioning Original Lotion Dry Skin Formula Softly Scented  Keri Deep Conditioning Original Lotion, Fragrance Free Sensitive Skin Formula  Keri Lotion Fast Absorbing Fragrance Free Sensitive Skin Formula  Keri Lotion Fast Absorbing Softly Scented Dry Skin Formula  Keri Original Lotion  Keri Skin Renewal Lotion Keri Silky Smooth Lotion  Keri Silky Smooth Sensitive Skin Lotion  Nivea Body Creamy Conditioning Oil  Nivea Body Extra Enriched Lotion  Nivea Body Original Lotion  Nivea Body Sheer Moisturizing Lotion Nivea Crme  Nivea Skin Firming Lotion  NutraDerm 30 Skin Lotion  NutraDerm Skin Lotion  NutraDerm Therapeutic Skin Cream  NutraDerm Therapeutic Skin Lotion  ProShield Protective Hand Cream  Provon moisturizing lotion  Please read over the following fact sheets that you were given.

## 2023-12-25 ENCOUNTER — Encounter (HOSPITAL_COMMUNITY)
Admission: RE | Admit: 2023-12-25 | Discharge: 2023-12-25 | Disposition: A | Source: Ambulatory Visit | Attending: Neurological Surgery

## 2023-12-25 ENCOUNTER — Other Ambulatory Visit: Payer: Self-pay

## 2023-12-25 ENCOUNTER — Encounter (HOSPITAL_COMMUNITY): Payer: Self-pay

## 2023-12-25 VITALS — BP 168/72 | HR 99 | Temp 98.3°F | Resp 16 | Ht 59.0 in | Wt 96.5 lb

## 2023-12-25 DIAGNOSIS — G35A Relapsing-remitting multiple sclerosis: Secondary | ICD-10-CM | POA: Diagnosis not present

## 2023-12-25 DIAGNOSIS — E871 Hypo-osmolality and hyponatremia: Secondary | ICD-10-CM | POA: Diagnosis not present

## 2023-12-25 DIAGNOSIS — Z01818 Encounter for other preprocedural examination: Secondary | ICD-10-CM | POA: Diagnosis not present

## 2023-12-25 DIAGNOSIS — R079 Chest pain, unspecified: Secondary | ICD-10-CM | POA: Insufficient documentation

## 2023-12-25 DIAGNOSIS — M797 Fibromyalgia: Secondary | ICD-10-CM | POA: Diagnosis not present

## 2023-12-25 DIAGNOSIS — I951 Orthostatic hypotension: Secondary | ICD-10-CM | POA: Diagnosis not present

## 2023-12-25 DIAGNOSIS — I10 Essential (primary) hypertension: Secondary | ICD-10-CM | POA: Insufficient documentation

## 2023-12-25 DIAGNOSIS — E2740 Unspecified adrenocortical insufficiency: Secondary | ICD-10-CM | POA: Diagnosis not present

## 2023-12-25 DIAGNOSIS — I081 Rheumatic disorders of both mitral and tricuspid valves: Secondary | ICD-10-CM | POA: Diagnosis not present

## 2023-12-25 HISTORY — DX: Rheumatoid arthritis, unspecified: M06.9

## 2023-12-25 LAB — CBC
HCT: 36.9 % (ref 36.0–46.0)
Hemoglobin: 12.1 g/dL (ref 12.0–15.0)
MCH: 32.5 pg (ref 26.0–34.0)
MCHC: 32.8 g/dL (ref 30.0–36.0)
MCV: 99.2 fL (ref 80.0–100.0)
Platelets: 279 K/uL (ref 150–400)
RBC: 3.72 MIL/uL — ABNORMAL LOW (ref 3.87–5.11)
RDW: 14.7 % (ref 11.5–15.5)
WBC: 9.7 K/uL (ref 4.0–10.5)
nRBC: 0 % (ref 0.0–0.2)

## 2023-12-25 LAB — SURGICAL PCR SCREEN
MRSA, PCR: NEGATIVE
Staphylococcus aureus: NEGATIVE

## 2023-12-25 LAB — TYPE AND SCREEN
ABO/RH(D): A POS
Antibody Screen: NEGATIVE

## 2023-12-25 NOTE — Progress Notes (Addendum)
 PCP - Dr. Elsie Lesches Cardiologist - denies  PPM/ICD - denies   Chest x-ray - 04/05/21 EKG - 12/25/23 Stress Test - 01/27/22 ECHO - 01/27/22 Cardiac Cath - denies  Sleep Study - denies   DM- denies  Last dose of GLP1 agonist-  n/a   ASA/Blood Thinner Instructions: n/a   ERAS Protcol - no, NPO   COVID TEST- n/a   Anesthesia review: yes, spinal cord stimulator, EKG review. Pt was instructed to bring remote for spinal cord stimulator  Patient denies shortness of breath, fever, cough and chest pain at PAT appointment   All instructions explained to the patient, with a verbal understanding of the material. Patient agrees to go over the instructions while at home for a better understanding.  The opportunity to ask questions was provided.

## 2023-12-26 DIAGNOSIS — N319 Neuromuscular dysfunction of bladder, unspecified: Secondary | ICD-10-CM | POA: Diagnosis not present

## 2023-12-26 NOTE — Progress Notes (Signed)
 Anesthesia Chart Review:  78 year old female follows with endocrinology for history of adrenal insufficiency, dysautonomic orthostatic hypotension, hypercalcemia. She has been on Florinef  since 2004, currently taking 0.05 mg daily as well as methylprednisolone  4 mg in the morning and 2 mg in the late afternoon. Last seen by Dr. Mercie 12/18/2023 and upcoming surgery was discussed.  Per note, Patient has longstanding history of secondary adrenal insufficiency. -Continue methylprednisolone  4 mg in the morning and 2 mg in the late afternoon.  Which is equivalent to taking hydrocortisone  around 30 mg daily. -Discussed illness rule out for steroid to take 2-3 times the usual dose in case of illness.  Advised to wear medical alert for adrenal insufficiency bracelet or necklace. - There is a plan for back surgery in the near future.  Recommend for stress dose of steroid/IV hydrocortisone  in perioperative period.  And advised to take double the dose of usual  dose of steroid/methylprednisolone  for 5 days after the surgery.  Steroids should be adjusted based on hemodynamic stability postoperatively.  Follows with neurology for history of RLS, fibromyalgia, migraines, possible relapsing remitting multiple sclerosis. MRI of the brain in November 2024 showed stable periventricular white matter lesions consistent with MS, no new or enhancing lesions were seen.  She was treated with Betaseron previously but could not tolerate the side effects, now not on any immunomodulation therapy.  Last seen in follow-up 08/28/2023, no changes to management.  She had prior cardiology evaluation in 2023 with echo 01/2022 showing LVEF 60 to 65%, normal wall motion, grade 1 DD, mild to moderate mitral regurgitation, moderate tricuspid regurgitation.  Nuclear stress test 01/2022 was low risk.  Other pertinent history includes anxiety/depression, GERD, chronic back pain s/p multiple lumbar surgeries and placement of [employer] spinal  cord stimulator, s/p total colectomy and small bowel follow-through in 1991  Preop labs reviewed, mild hyponatremia sodium 134, otherwise unremarkable.  EKG 12/25/2023: NSR.  Rate 92.  Artifact from spinal cord stimulator.  Echocardiogram 01/27/2022: Normal LV systolic function with visual EF 60-65%. Left ventricle cavity is normal in size. Mild concentric hypertrophy of the left ventricle. Normal global wall motion. Doppler evidence of grade I (impaired) diastolic dysfunction, normal LAP. Calculated EF 68%. Structurally normal mitral valve.  Mild to moderate mitral regurgitation. Structurally normal tricuspid valve.  Moderate tricuspid regurgitation. No evidence of pulmonary hypertension. Pericardium is normal. Trace pericardial effusion. There is no hemodynamic significance. No prior available for comparison.    Stress Testing Lexiscan  Tetrofosmin stress test 01/27/2022: 1 Day Rest/Stress protocol. Stress EKG is non-diagnostic due to target heart rate not achieved and 10/10 chest pain during stress. Chest pain: Yes Hypertensive response to exercise: No, present at baseline (rest 190/80, peak 180/68). Normal myocardial perfusion without convincing evidence of reversible ischemia or prior infarct. Left ventricular size normal, wall thickness preserved, calculated LVEF 62%.  No prior studies available for comparison Low risk study, clinical correlation required due to precordial pain during pharmacological stress and uncontrolled hypertension.    Lynwood Geofm [facility] Short Stay Center/Anesthesiology Phone [phone] 12/26/2023 4:22 PM

## 2023-12-26 NOTE — Anesthesia Preprocedure Evaluation (Addendum)
 Anesthesia Evaluation  Patient identified by MRN, date of birth, ID band Patient awake    Reviewed: Allergy & Precautions, H&P , NPO status , Patient's Chart, lab work & pertinent test results  Airway Mallampati: II   Neck ROM: full    Dental   Pulmonary neg pulmonary ROS   breath sounds clear to auscultation       Cardiovascular hypertension,  Rhythm:regular Rate:Normal     Neuro/Psych  Headaches PSYCHIATRIC DISORDERS Anxiety Depression    Multiple sclerosis    GI/Hepatic IBS   Endo/Other  Addison's dz.  On chronic steroids  Renal/GU      Musculoskeletal  (+) Arthritis , Rheumatoid disorders,  Fibromyalgia -  Abdominal   Peds  Hematology   Anesthesia Other Findings   Reproductive/Obstetrics                              Anesthesia Physical Anesthesia Plan  ASA: 3  Anesthesia Plan: General   Post-op Pain Management:    Induction:   PONV Risk Score and Plan: 3 and Ondansetron , Dexamethasone  and Treatment may vary due to age or medical condition  Airway Management Planned: Oral ETT  Additional Equipment:   Intra-op Plan:   Post-operative Plan: Extubation in OR  Informed Consent: I have reviewed the patients History and Physical, chart, labs and discussed the procedure including the risks, benefits and alternatives for the proposed anesthesia with the patient or authorized representative who has indicated his/her understanding and acceptance.     Dental advisory given  Plan Discussed with: CRNA, Anesthesiologist and Surgeon  Anesthesia Plan Comments: (PAT note by Lynwood Hope, PA-C: 78 year old female follows with endocrinology for history of adrenal insufficiency, dysautonomic orthostatic hypotension, hypercalcemia. She has been on Florinef  since 2004, currently taking 0.05 mg daily as well as methylprednisolone  4 mg in the morning and 2 mg in the late afternoon. Last seen by  Dr. Mercie 12/18/2023 and upcoming surgery was discussed.  Per note, Patient has longstanding history of secondary adrenal insufficiency. -Continue methylprednisolone  4 mg in the morning and 2 mg in the late afternoon.  Which is equivalent to taking hydrocortisone  around 30 mg daily. -Discussed illness rule out for steroid to take 2-3 times the usual dose in case of illness.  Advised to wear medical alert for adrenal insufficiency bracelet or necklace. - There is a plan for back surgery in the near future.  Recommend for stress dose of steroid/IV hydrocortisone  in perioperative period.  And advised to take double the dose of usual  dose of steroid/methylprednisolone  for 5 days after the surgery.  Steroids should be adjusted based on hemodynamic stability postoperatively.  Follows with neurology for history of RLS, fibromyalgia, migraines, possible relapsing remitting multiple sclerosis. MRI of the brain in November 2024 showed stable periventricular white matter lesions consistent with MS, no new or enhancing lesions were seen.  She was treated with Betaseron previously but could not tolerate the side effects, now not on any immunomodulation therapy.  Last seen in follow-up 08/28/2023, no changes to management.  She had prior cardiology evaluation in 2023 with echo 01/2022 showing LVEF 60 to 65%, normal wall motion, grade 1 DD, mild to moderate mitral regurgitation, moderate tricuspid regurgitation.  Nuclear stress test 01/2022 was low risk.  Other pertinent history includes anxiety/depression, GERD, chronic back pain s/p multiple lumbar surgeries and placement of [employer] spinal cord stimulator, s/p total colectomy and small bowel follow-through in 1991  Preop  labs reviewed, mild hyponatremia sodium 134, otherwise unremarkable.  EKG 12/25/2023: NSR.  Rate 92.  Artifact from spinal cord stimulator.  Echocardiogram 01/27/2022: Normal LV systolic function with visual EF 60-65%. Left ventricle  cavity is normal in size. Mild concentric hypertrophy of the left ventricle. Normal global wall motion. Doppler evidence of grade I (impaired) diastolic dysfunction, normal LAP. Calculated EF 68%. Structurally normal mitral valve.  Mild to moderate mitral regurgitation. Structurally normal tricuspid valve.  Moderate tricuspid regurgitation. No evidence of pulmonary hypertension. Pericardium is normal. Trace pericardial effusion. There is no hemodynamic significance. No prior available for comparison.   Stress Testing Lexiscan  Tetrofosmin stress test 01/27/2022: 1 Day Rest/Stress protocol. Stress EKG is non-diagnostic due to target heart rate not achieved and 10/10 chest pain during stress. Chest pain: Yes Hypertensive response to exercise: No, present at baseline (rest 190/80, peak 180/68). Normal myocardial perfusion without convincing evidence of reversible ischemia or prior infarct. Left ventricular size normal, wall thickness preserved, calculated LVEF 62%.  No prior studies available for comparison Low risk study, clinical correlation required due to precordial pain during pharmacological stress and uncontrolled hypertension.  )         Anesthesia Quick Evaluation

## 2023-12-29 ENCOUNTER — Inpatient Hospital Stay (HOSPITAL_COMMUNITY): Payer: Self-pay | Admitting: Physician Assistant

## 2023-12-29 ENCOUNTER — Inpatient Hospital Stay (HOSPITAL_COMMUNITY): Admitting: Anesthesiology

## 2023-12-29 ENCOUNTER — Encounter (HOSPITAL_COMMUNITY)
Admission: RE | Disposition: A | Discharge status: Home Health | Payer: Self-pay | Source: Home / Self Care | Attending: Neurological Surgery

## 2023-12-29 ENCOUNTER — Other Ambulatory Visit: Payer: Self-pay

## 2023-12-29 ENCOUNTER — Inpatient Hospital Stay (HOSPITAL_COMMUNITY)

## 2023-12-29 ENCOUNTER — Inpatient Hospital Stay (HOSPITAL_COMMUNITY)
Admission: RE | Admit: 2023-12-29 | Discharge: 2023-12-31 | DRG: 448 | Disposition: A | Discharge status: Home Health | Attending: Neurological Surgery | Admitting: Neurological Surgery

## 2023-12-29 ENCOUNTER — Encounter (HOSPITAL_COMMUNITY): Payer: Self-pay | Admitting: Neurological Surgery

## 2023-12-29 DIAGNOSIS — Z981 Arthrodesis status: Principal | ICD-10-CM

## 2023-12-29 DIAGNOSIS — Z96652 Presence of left artificial knee joint: Secondary | ICD-10-CM | POA: Diagnosis present

## 2023-12-29 DIAGNOSIS — Z8249 Family history of ischemic heart disease and other diseases of the circulatory system: Secondary | ICD-10-CM | POA: Diagnosis not present

## 2023-12-29 DIAGNOSIS — M48061 Spinal stenosis, lumbar region without neurogenic claudication: Secondary | ICD-10-CM | POA: Diagnosis present

## 2023-12-29 DIAGNOSIS — F32A Depression, unspecified: Secondary | ICD-10-CM | POA: Diagnosis present

## 2023-12-29 DIAGNOSIS — Z888 Allergy status to other drugs, medicaments and biological substances status: Secondary | ICD-10-CM | POA: Diagnosis not present

## 2023-12-29 DIAGNOSIS — Z823 Family history of stroke: Secondary | ICD-10-CM

## 2023-12-29 DIAGNOSIS — F419 Anxiety disorder, unspecified: Secondary | ICD-10-CM | POA: Diagnosis not present

## 2023-12-29 DIAGNOSIS — Z90710 Acquired absence of both cervix and uterus: Secondary | ICD-10-CM

## 2023-12-29 DIAGNOSIS — G2581 Restless legs syndrome: Secondary | ICD-10-CM | POA: Diagnosis present

## 2023-12-29 DIAGNOSIS — I10 Essential (primary) hypertension: Secondary | ICD-10-CM

## 2023-12-29 DIAGNOSIS — G35D Multiple sclerosis, unspecified: Secondary | ICD-10-CM | POA: Diagnosis present

## 2023-12-29 DIAGNOSIS — Z79899 Other long term (current) drug therapy: Secondary | ICD-10-CM | POA: Diagnosis not present

## 2023-12-29 DIAGNOSIS — M797 Fibromyalgia: Secondary | ICD-10-CM | POA: Diagnosis present

## 2023-12-29 DIAGNOSIS — F418 Other specified anxiety disorders: Secondary | ICD-10-CM | POA: Diagnosis not present

## 2023-12-29 DIAGNOSIS — Z886 Allergy status to analgesic agent status: Secondary | ICD-10-CM

## 2023-12-29 DIAGNOSIS — M5416 Radiculopathy, lumbar region: Secondary | ICD-10-CM | POA: Diagnosis not present

## 2023-12-29 DIAGNOSIS — Z9889 Other specified postprocedural states: Secondary | ICD-10-CM | POA: Diagnosis not present

## 2023-12-29 DIAGNOSIS — M5126 Other intervertebral disc displacement, lumbar region: Secondary | ICD-10-CM

## 2023-12-29 DIAGNOSIS — Z881 Allergy status to other antibiotic agents status: Secondary | ICD-10-CM

## 2023-12-29 DIAGNOSIS — M5116 Intervertebral disc disorders with radiculopathy, lumbar region: Principal | ICD-10-CM | POA: Diagnosis present

## 2023-12-29 DIAGNOSIS — M810 Age-related osteoporosis without current pathological fracture: Secondary | ICD-10-CM | POA: Diagnosis not present

## 2023-12-29 DIAGNOSIS — M069 Rheumatoid arthritis, unspecified: Secondary | ICD-10-CM | POA: Diagnosis not present

## 2023-12-29 DIAGNOSIS — Z7952 Long term (current) use of systemic steroids: Secondary | ICD-10-CM

## 2023-12-29 DIAGNOSIS — Z9682 Presence of neurostimulator: Secondary | ICD-10-CM | POA: Diagnosis not present

## 2023-12-29 DIAGNOSIS — E271 Primary adrenocortical insufficiency: Secondary | ICD-10-CM | POA: Diagnosis present

## 2023-12-29 HISTORY — PX: LAMINECTOMY WITH POSTERIOR LATERAL ARTHRODESIS LEVEL 2: SHX6336

## 2023-12-29 SURGERY — LAMINECTOMY WITH POSTERIOR LATERAL ARTHRODESIS LEVEL 2
Anesthesia: General | Site: Back

## 2023-12-29 MED ORDER — ONDANSETRON HCL 4 MG/2ML IJ SOLN
INTRAMUSCULAR | Status: DC | PRN
  Administered 2023-12-29: 4 mg via INTRAVENOUS

## 2023-12-29 MED ORDER — POTASSIUM CHLORIDE IN NACL 20-0.9 MEQ/L-% IV SOLN
INTRAVENOUS | Status: DC
  Filled 2023-12-29 (×3): qty 1000

## 2023-12-29 MED ORDER — METHOCARBAMOL 500 MG PO TABS
500.0000 mg | ORAL_TABLET | Freq: Four times a day (QID) | ORAL | Status: DC | PRN
Start: 2023-12-29 — End: 2023-12-31
  Administered 2023-12-30 (×2): 500 mg via ORAL
  Filled 2023-12-29 (×2): qty 1

## 2023-12-29 MED ORDER — PHENOL 1.4 % MT LIQD
1.0000 | OROMUCOSAL | Status: DC | PRN
Start: 2023-12-29 — End: 2023-12-31

## 2023-12-29 MED ORDER — ACETAMINOPHEN 500 MG PO TABS
1000.0000 mg | ORAL_TABLET | Freq: Four times a day (QID) | ORAL | Status: AC
Start: 2023-12-29 — End: 2023-12-30
  Administered 2023-12-29 – 2023-12-30 (×4): 1000 mg via ORAL
  Filled 2023-12-29 (×2): qty 2

## 2023-12-29 MED ORDER — MIDAZOLAM HCL 2 MG/2ML IJ SOLN
INTRAMUSCULAR | Status: AC
Start: 2023-12-29 — End: 2023-12-29
  Filled 2023-12-29: qty 2

## 2023-12-29 MED ORDER — CEFAZOLIN SODIUM-DEXTROSE 2-4 GM/100ML-% IV SOLN
2.0000 g | INTRAVENOUS | Status: AC
  Administered 2023-12-29: 2 g via INTRAVENOUS
  Filled 2023-12-29: qty 100

## 2023-12-29 MED ORDER — OXYCODONE HCL 5 MG/5ML PO SOLN: 5.0000 mg | Freq: Once | ORAL | Status: DC | PRN

## 2023-12-29 MED ORDER — ALBUMIN HUMAN 5 % IV SOLN: INTRAVENOUS | Status: DC | PRN

## 2023-12-29 MED ORDER — ACETAMINOPHEN 500 MG PO TABS
1000.0000 mg | ORAL_TABLET | ORAL | Status: AC
  Administered 2023-12-29: 1000 mg via ORAL
  Filled 2023-12-29: qty 2

## 2023-12-29 MED ORDER — FENTANYL CITRATE (PF) 100 MCG/2ML IJ SOLN
INTRAMUSCULAR | Status: AC
  Filled 2023-12-29: qty 2

## 2023-12-29 MED ORDER — SODIUM CHLORIDE 0.9% FLUSH
3.0000 mL | Freq: Two times a day (BID) | INTRAVENOUS | Status: DC
  Administered 2023-12-29 – 2023-12-31 (×4): 3 mL via INTRAVENOUS

## 2023-12-29 MED ORDER — OXYCODONE HCL 5 MG PO TABS
5.0000 mg | ORAL_TABLET | ORAL | Status: DC | PRN
  Administered 2023-12-29 – 2023-12-31 (×2): 5 mg via ORAL
  Filled 2023-12-29 (×2): qty 1

## 2023-12-29 MED ORDER — LIDOCAINE 2% (20 MG/ML) 5 ML SYRINGE
INTRAMUSCULAR | Status: AC
  Filled 2023-12-29: qty 5

## 2023-12-29 MED ORDER — ALFUZOSIN HCL ER 10 MG PO TB24
10.0000 mg | ORAL_TABLET | Freq: Every day | ORAL | Status: DC
  Administered 2023-12-30 – 2023-12-31 (×2): 10 mg via ORAL
  Filled 2023-12-29 (×2): qty 1

## 2023-12-29 MED ORDER — ONDANSETRON HCL 4 MG PO TABS: 4.0000 mg | ORAL_TABLET | Freq: Four times a day (QID) | ORAL | Status: DC | PRN

## 2023-12-29 MED ORDER — SUGAMMADEX SODIUM 200 MG/2ML IV SOLN
INTRAVENOUS | Status: DC | PRN
Start: 2023-12-29 — End: 2023-12-29
  Administered 2023-12-29: 200 mg via INTRAVENOUS

## 2023-12-29 MED ORDER — ROCURONIUM BROMIDE 10 MG/ML (PF) SYRINGE
PREFILLED_SYRINGE | INTRAVENOUS | Status: DC | PRN
  Administered 2023-12-29: 50 mg via INTRAVENOUS
  Administered 2023-12-29: 20 mg via INTRAVENOUS

## 2023-12-29 MED ORDER — CYCLOSPORINE 0.05 % OP EMUL
1.0000 [drp] | Freq: Two times a day (BID) | OPHTHALMIC | Status: DC
  Administered 2023-12-29 – 2023-12-31 (×4): 1 [drp] via OPHTHALMIC
  Filled 2023-12-29 (×5): qty 30

## 2023-12-29 MED ORDER — ONDANSETRON HCL 4 MG/2ML IJ SOLN: 4.0000 mg | Freq: Four times a day (QID) | INTRAMUSCULAR | Status: DC | PRN

## 2023-12-29 MED ORDER — CHLORHEXIDINE GLUCONATE 0.12 % MT SOLN
15.0000 mL | Freq: Once | OROMUCOSAL | Status: AC
  Administered 2023-12-29: 15 mL via OROMUCOSAL
  Filled 2023-12-29: qty 15

## 2023-12-29 MED ORDER — FENTANYL CITRATE (PF) 100 MCG/2ML IJ SOLN
25.0000 ug | INTRAMUSCULAR | Status: DC | PRN
  Administered 2023-12-29 (×2): 25 ug via INTRAVENOUS

## 2023-12-29 MED ORDER — BUPIVACAINE HCL (PF) 0.25 % IJ SOLN
INTRAMUSCULAR | Status: AC
  Filled 2023-12-29: qty 30

## 2023-12-29 MED ORDER — THROMBIN 5000 UNITS EX SOLR
OROMUCOSAL | Status: DC | PRN
  Administered 2023-12-29: 5 mL via TOPICAL

## 2023-12-29 MED ORDER — THROMBIN 20000 UNITS EX SOLR
CUTANEOUS | Status: AC
  Filled 2023-12-29: qty 20000

## 2023-12-29 MED ORDER — CLOBETASOL PROPIONATE 0.05 % EX OINT
1.0000 | TOPICAL_OINTMENT | Freq: Two times a day (BID) | CUTANEOUS | Status: DC
  Administered 2023-12-29 – 2023-12-31 (×4): 1 via TOPICAL
  Filled 2023-12-29: qty 15

## 2023-12-29 MED ORDER — OXYCODONE HCL 5 MG PO TABS: 5.0000 mg | ORAL_TABLET | Freq: Once | ORAL | Status: DC | PRN

## 2023-12-29 MED ORDER — CEFAZOLIN SODIUM-DEXTROSE 2-4 GM/100ML-% IV SOLN
2.0000 g | Freq: Three times a day (TID) | INTRAVENOUS | Status: AC
  Administered 2023-12-29 – 2023-12-30 (×2): 2 g via INTRAVENOUS
  Filled 2023-12-29 (×2): qty 100

## 2023-12-29 MED ORDER — 0.9 % SODIUM CHLORIDE (POUR BTL) OPTIME
TOPICAL | Status: DC | PRN
  Administered 2023-12-29: 1000 mL

## 2023-12-29 MED ORDER — PROPOFOL 10 MG/ML IV BOLUS
INTRAVENOUS | Status: AC
  Filled 2023-12-29: qty 20

## 2023-12-29 MED ORDER — ONDANSETRON HCL 4 MG/2ML IJ SOLN
INTRAMUSCULAR | Status: AC
  Filled 2023-12-29: qty 2

## 2023-12-29 MED ORDER — VITAMIN D 25 MCG (1000 UNIT) PO TABS
1000.0000 [IU] | ORAL_TABLET | Freq: Every day | ORAL | Status: DC
  Administered 2023-12-30 – 2023-12-31 (×2): 1000 [IU] via ORAL
  Filled 2023-12-29 (×2): qty 1

## 2023-12-29 MED ORDER — LACTATED RINGERS IV SOLN: INTRAVENOUS | Status: DC

## 2023-12-29 MED ORDER — BUSPIRONE HCL 5 MG PO TABS
5.0000 mg | ORAL_TABLET | Freq: Two times a day (BID) | ORAL | Status: DC
  Administered 2023-12-29 – 2023-12-31 (×4): 5 mg via ORAL
  Filled 2023-12-29 (×4): qty 1

## 2023-12-29 MED ORDER — BUPIVACAINE HCL (PF) 0.25 % IJ SOLN
INTRAMUSCULAR | Status: DC | PRN
  Administered 2023-12-29: 9 mL

## 2023-12-29 MED ORDER — ROCURONIUM BROMIDE 10 MG/ML (PF) SYRINGE
PREFILLED_SYRINGE | INTRAVENOUS | Status: AC
Start: 2023-12-29 — End: 2023-12-29
  Filled 2023-12-29: qty 10

## 2023-12-29 MED ORDER — FENTANYL CITRATE (PF) 250 MCG/5ML IJ SOLN
INTRAMUSCULAR | Status: AC
  Filled 2023-12-29: qty 5

## 2023-12-29 MED ORDER — SENNA 8.6 MG PO TABS
1.0000 | ORAL_TABLET | Freq: Two times a day (BID) | ORAL | Status: DC
  Administered 2023-12-29 – 2023-12-31 (×4): 8.6 mg via ORAL
  Filled 2023-12-29 (×4): qty 1

## 2023-12-29 MED ORDER — FENTANYL CITRATE (PF) 250 MCG/5ML IJ SOLN
INTRAMUSCULAR | Status: DC | PRN
  Administered 2023-12-29: 75 ug via INTRAVENOUS
  Administered 2023-12-29: 25 ug via INTRAVENOUS

## 2023-12-29 MED ORDER — HYDROCORTISONE SOD SUC (PF) 100 MG IJ SOLR
INTRAMUSCULAR | Status: DC | PRN
  Administered 2023-12-29: 250 mg via INTRAVENOUS

## 2023-12-29 MED ORDER — CHLORHEXIDINE GLUCONATE CLOTH 2 % EX PADS: 6.0000 | MEDICATED_PAD | Freq: Once | CUTANEOUS | Status: DC

## 2023-12-29 MED ORDER — SODIUM CHLORIDE 0.9 % IV SOLN: 250.0000 mL | INTRAVENOUS | Status: AC

## 2023-12-29 MED ORDER — PANTOPRAZOLE SODIUM 40 MG PO TBEC
40.0000 mg | DELAYED_RELEASE_TABLET | Freq: Every day | ORAL | Status: DC
  Administered 2023-12-30 – 2023-12-31 (×2): 40 mg via ORAL
  Filled 2023-12-29 (×2): qty 1

## 2023-12-29 MED ORDER — LIDOCAINE 2% (20 MG/ML) 5 ML SYRINGE
INTRAMUSCULAR | Status: DC | PRN
  Administered 2023-12-29: 60 mg via INTRAVENOUS

## 2023-12-29 MED ORDER — HYDROCORTISONE SOD SUC (PF) 250 MG IJ SOLR
INTRAMUSCULAR | Status: AC
Start: 2023-12-29 — End: 2023-12-29
  Filled 2023-12-29: qty 250

## 2023-12-29 MED ORDER — PHENYLEPHRINE 80 MCG/ML (10ML) SYRINGE FOR IV PUSH (FOR BLOOD PRESSURE SUPPORT)
PREFILLED_SYRINGE | INTRAVENOUS | Status: AC
  Filled 2023-12-29: qty 10

## 2023-12-29 MED ORDER — PHENYLEPHRINE HCL-NACL 20-0.9 MG/250ML-% IV SOLN
INTRAVENOUS | Status: DC | PRN
Start: 2023-12-29 — End: 2023-12-29
  Administered 2023-12-29 (×2): 80 ug via INTRAVENOUS
  Administered 2023-12-29: 160 ug via INTRAVENOUS
  Administered 2023-12-29: 80 ug via INTRAVENOUS

## 2023-12-29 MED ORDER — TRAZODONE HCL 50 MG PO TABS
100.0000 mg | ORAL_TABLET | Freq: Every evening | ORAL | Status: DC | PRN
  Administered 2023-12-30 (×2): 100 mg via ORAL
  Filled 2023-12-29 (×2): qty 2

## 2023-12-29 MED ORDER — THROMBIN 20000 UNITS EX SOLR
CUTANEOUS | Status: DC | PRN
  Administered 2023-12-29: 20 mL via TOPICAL

## 2023-12-29 MED ORDER — GABAPENTIN 300 MG PO CAPS
300.0000 mg | ORAL_CAPSULE | ORAL | Status: AC
  Administered 2023-12-29: 300 mg via ORAL
  Filled 2023-12-29: qty 1

## 2023-12-29 MED ORDER — BETHANECHOL CHLORIDE 10 MG PO TABS
10.0000 mg | ORAL_TABLET | Freq: Two times a day (BID) | ORAL | Status: DC
  Administered 2023-12-29 – 2023-12-31 (×4): 10 mg via ORAL
  Filled 2023-12-29 (×4): qty 1

## 2023-12-29 MED ORDER — THROMBIN 5000 UNITS EX KIT
PACK | CUTANEOUS | Status: AC
  Filled 2023-12-29: qty 1

## 2023-12-29 MED ORDER — HYDROCORTISONE SOD SUC (PF) 250 MG IJ SOLR
INTRAMUSCULAR | Status: AC
  Filled 2023-12-29: qty 250

## 2023-12-29 MED ORDER — MENTHOL 3 MG MT LOZG: 1.0000 | LOZENGE | OROMUCOSAL | Status: DC | PRN

## 2023-12-29 MED ORDER — SODIUM CHLORIDE 0.9% FLUSH
3.0000 mL | INTRAVENOUS | Status: DC | PRN
Start: 2023-12-29 — End: 2023-12-31

## 2023-12-29 MED ORDER — PROPOFOL 10 MG/ML IV BOLUS
INTRAVENOUS | Status: DC | PRN
  Administered 2023-12-29: 80 mg via INTRAVENOUS

## 2023-12-29 MED ORDER — MORPHINE SULFATE (PF) 2 MG/ML IV SOLN: 2.0000 mg | INTRAVENOUS | Status: DC | PRN

## 2023-12-29 MED ORDER — ROCURONIUM BROMIDE 10 MG/ML (PF) SYRINGE
PREFILLED_SYRINGE | INTRAVENOUS | Status: AC
  Filled 2023-12-29: qty 10

## 2023-12-29 MED ORDER — FERROUS SULFATE 325 (65 FE) MG PO TABS
325.0000 mg | ORAL_TABLET | Freq: Every day | ORAL | Status: DC
  Administered 2023-12-30 – 2023-12-31 (×2): 325 mg via ORAL
  Filled 2023-12-29 (×2): qty 1

## 2023-12-29 MED ORDER — ONDANSETRON HCL 4 MG/2ML IJ SOLN
INTRAMUSCULAR | Status: AC
Start: 2023-12-29 — End: 2023-12-29
  Filled 2023-12-29: qty 2

## 2023-12-29 MED ORDER — METHOCARBAMOL 1000 MG/10ML IJ SOLN: 500.0000 mg | Freq: Four times a day (QID) | INTRAMUSCULAR | Status: DC | PRN

## 2023-12-29 MED ORDER — MIDAZOLAM HCL (PF) 2 MG/2ML IJ SOLN
INTRAMUSCULAR | Status: DC | PRN
  Administered 2023-12-29: 1 mg via INTRAVENOUS

## 2023-12-29 MED ORDER — ORAL CARE MOUTH RINSE: 15.0000 mL | Freq: Once | OROMUCOSAL | Status: AC

## 2023-12-29 SURGICAL SUPPLY — 54 items
ALLOGRAFT BONE FIBER KORE 5 (Bone Implant) IMPLANT
BAG COUNTER SPONGE SURGICOUNT (BAG) ×2 IMPLANT
BASKET BONE COLLECTION (BASKET) IMPLANT
BENZOIN TINCTURE PRP APPL 2/3 (GAUZE/BANDAGES/DRESSINGS) ×2 IMPLANT
BIT DRILL PLIF MAS 5.0MM DISP (DRILL) IMPLANT
BLADE BONE MILL MEDIUM (MISCELLANEOUS) IMPLANT
BLADE CLIPPER SURG (BLADE) IMPLANT
BUR CARBIDE MATCH 3.0 (BURR) ×2 IMPLANT
CANISTER SUCTION 3000ML PPV (SUCTIONS) ×2 IMPLANT
CLSR STERI-STRIP ANTIMIC 1/2X4 (GAUZE/BANDAGES/DRESSINGS) IMPLANT
CNTNR URN SCR LID CUP LEK RST (MISCELLANEOUS) ×2 IMPLANT
COVER BACK TABLE 60X90IN (DRAPES) ×2 IMPLANT
DERMABOND ADVANCED .7 DNX12 (GAUZE/BANDAGES/DRESSINGS) IMPLANT
DRAPE C-ARM 42X72 X-RAY (DRAPES) IMPLANT
DRAPE LAPAROTOMY 100X72X124 (DRAPES) ×2 IMPLANT
DRAPE SURG 17X23 STRL (DRAPES) ×2 IMPLANT
DRESSING AQUACEL AG SP 3.5X6 (GAUZE/BANDAGES/DRESSINGS) IMPLANT
DRSG AQUACEL AG ADV 3.5X 6 (GAUZE/BANDAGES/DRESSINGS) ×2 IMPLANT
DURAPREP 26ML APPLICATOR (WOUND CARE) ×2 IMPLANT
ELECTRODE REM PT RTRN 9FT ADLT (ELECTROSURGICAL) ×2 IMPLANT
EVACUATOR 1/8 PVC DRAIN (DRAIN) IMPLANT
GAUZE 4X4 16PLY ~~LOC~~+RFID DBL (SPONGE) IMPLANT
GLOVE BIO SURGEON STRL SZ7 (GLOVE) IMPLANT
GLOVE BIO SURGEON STRL SZ8 (GLOVE) ×4 IMPLANT
GLOVE BIOGEL PI IND STRL 7.0 (GLOVE) IMPLANT
GOWN STRL REUS W/ TWL LRG LVL3 (GOWN DISPOSABLE) IMPLANT
GOWN STRL REUS W/ TWL XL LVL3 (GOWN DISPOSABLE) ×4 IMPLANT
GOWN STRL REUS W/TWL 2XL LVL3 (GOWN DISPOSABLE) IMPLANT
GRAFT BN 10X1XDBM MAGNIFUSE (Bone Implant) IMPLANT
HEMOSTAT POWDER KIT SURGIFOAM (HEMOSTASIS) ×2 IMPLANT
KIT BASIN OR (CUSTOM PROCEDURE TRAY) ×2 IMPLANT
KIT INFUSE SMALL (Orthopedic Implant) IMPLANT
KIT TURNOVER KIT B (KITS) ×2 IMPLANT
MILL BONE PREP (MISCELLANEOUS) IMPLANT
NDL HYPO 25X1 1.5 SAFETY (NEEDLE) ×2 IMPLANT
PACK LAMINECTOMY NEURO (CUSTOM PROCEDURE TRAY) ×2 IMPLANT
PAD ARMBOARD POSITIONER FOAM (MISCELLANEOUS) ×6 IMPLANT
ROD FIXATION PLIF 85MM PREBENT (Rod) IMPLANT
ROD PREBENT 80MM LUMBAR (Rod) IMPLANT
SCREW LOCK FXNS SPNE MAS PL (Screw) IMPLANT
SCREW SHANK 5.5X40MM (Screw) IMPLANT
SCREW TULIP 5.5 (Screw) IMPLANT
SOLN 0.9% NACL POUR BTL 1000ML (IV SOLUTION) ×2 IMPLANT
SOLN STERILE WATER BTL 1000 ML (IV SOLUTION) ×2 IMPLANT
SOLUTION IRRIG SURGIPHOR (IV SOLUTION) ×2 IMPLANT
SPONGE SURGIFOAM ABS GEL 100 (HEMOSTASIS) ×2 IMPLANT
SPONGE T-LAP 4X18 ~~LOC~~+RFID (SPONGE) IMPLANT
STRIP CLOSURE SKIN 1/2X4 (GAUZE/BANDAGES/DRESSINGS) ×4 IMPLANT
SUT VIC AB 0 CT1 18XCR BRD8 (SUTURE) ×2 IMPLANT
SUT VIC AB 2-0 CP2 18 (SUTURE) ×2 IMPLANT
SUT VIC AB 3-0 SH 8-18 (SUTURE) ×4 IMPLANT
TOWEL GREEN STERILE (TOWEL DISPOSABLE) ×2 IMPLANT
TOWEL GREEN STERILE FF (TOWEL DISPOSABLE) ×2 IMPLANT
TRAY FOLEY MTR SLVR 16FR STAT (SET/KITS/TRAYS/PACK) IMPLANT

## 2023-12-29 NOTE — H&P (Signed)
 Subjective: Patient is a 78 y.o. female admitted for back and leg pain. Onset of symptoms was several months ago, gradually worsening since that time.  The pain is rated severe, and is located at the across the lower back and radiates to legs. The pain is described as aching and occurs all day. The symptoms have been progressive. Symptoms are exacerbated by exercise, standing, and walking for more than a few minutes. 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    Past Medical History:  Diagnosis Date   Addison's disease (HCC)    takes Solu Cortef  daily   Anemia    takes Ferrous Sulfate  daily   Anxiety    takes Xanax  nightly   Arthritis    Chronic back pain    stenosis   Depression    takes Cymbalta  daily   Dizziness    if b/p drops    Fibromyalgia    History of blood transfusion    no abnormal reaction noted   History of bronchitis    many yrs ago    Hypokalemia    takes Potassium daily   Hypotension    takes Florinef  daily   IBS (irritable bowel syndrome)    takes Align daily   Insomnia    takes Trazodone  nightly   Joint pain    Multiple sclerosis    doesn't take any meds   Multiple sclerosis    Nausea vomiting and diarrhea 03/04/2023   New onset headache 12/22/2021   Nocturia    Osteoporosis    Palpitations    Peripheral neuropathy    Primary localized osteoarthritis of left knee 08/11/2020   RA (rheumatoid arthritis) (HCC)    Restless leg syndrome    Seasonal allergies    takes Zyrtec daily;uses Flonase  daily as needed   Syncope    when missed methylprednisolone  dose   Urinary frequency    takes Flomax  daily   Weakness    numbness and tingling    Past Surgical History:  Procedure Laterality Date   ABDOMINAL HYSTERECTOMY  02/07/1986   APPENDECTOMY  02/07/1986   CESAREAN SECTION  1973/1977   x2   CHOLECYSTECTOMY  02/08/1995   COLECTOMY  02/08/1988   COLONOSCOPY     ESOPHAGOGASTRODUODENOSCOPY     EYE SURGERY     bilateral - /w IOL-  cataracts   FRACTURE SURGERY Right    rods and screws-right leg   LUMBAR LAMINECTOMY/DECOMPRESSION MICRODISCECTOMY Left 01/24/2013   Procedure: LUMBAR FIVE TO SACRAL ONE LUMBAR LAMINECTOMY/DECOMPRESSION MICRODISCECTOMY 1 LEVEL;  Surgeon: Alm GORMAN Molt, MD;  Location: [facility];  Service: Neurosurgery;  Laterality: Left;   MAXIMUM ACCESS (MAS)POSTERIOR LUMBAR INTERBODY FUSION (PLIF) 1 LEVEL N/A 06/18/2014   Procedure: MAXIMUM ACCESS SURGERY POSTERIOR LUMBAR INTERBODY FUSION LUMBAR FIVE TO SACRAL ONE ;  Surgeon: Alm GORMAN Molt, MD;  Location: [facility];  Service: Neurosurgery;  Laterality: N/A;   SPINAL CORD STIMULATOR BATTERY EXCHANGE Right 07/30/2021   Procedure: Spinal cord stimulator battery replacement;  Surgeon: Molt Alm GORMAN, MD;  Location: [facility];  Service: Neurosurgery;  Laterality: Right;   SPINAL CORD STIMULATOR IMPLANT     TOTAL KNEE ARTHROPLASTY Left 08/24/2020   Procedure: TOTAL KNEE ARTHROPLASTY;  Surgeon: Jane Charleston, MD;  Location: [facility];  Service: Orthopedics;  Laterality: Left;    Prior to Admission medications   Medication Sig Start Date End Date Taking? Authorizing Provider  acetaminophen  (TYLENOL ) 325 MG tablet Take 1-2 tablets (325-650 mg total) by mouth every  4 (four) hours as needed for mild pain. 08/29/22  Yes Setzer, Sandra J, PA-C  alfuzosin  (UROXATRAL ) 10 MG 24 hr tablet Take 10 mg by mouth daily with breakfast.   Yes [provider]  b complex vitamins capsule Take 1 capsule by mouth in the morning and at bedtime.   Yes [provider]  bethanechol  (URECHOLINE ) 10 MG tablet Take 10 mg by mouth 2 (two) times daily. 12/12/22  Yes [provider]  busPIRone  (BUSPAR ) 5 MG tablet Take 5 mg by mouth 2 (two) times daily. 12/19/23  Yes [provider]  cholecalciferol  (VITAMIN D3) 25 MCG (1000 UNIT) tablet Take 1,000 Units by mouth every morning.   Yes [provider]  clobetasol  ointment (TEMOVATE ) 0.05 % Apply 1  Application topically 2 (two) times daily. 12/04/23  Yes [provider]  CRANBERRY PO Take 1 tablet by mouth in the morning.   Yes [provider]  cycloSPORINE  (RESTASIS ) 0.05 % ophthalmic emulsion Place 1 drop into both eyes 2 (two) times daily. 12/13/23  Yes [provider]  esomeprazole (NEXIUM) 40 MG capsule Take 1 capsule by mouth daily. 12/19/23  Yes [provider]  ferrous sulfate  325 (65 FE) MG tablet Take 325 mg by mouth daily with breakfast.   Yes [provider]  fludrocortisone  (FLORINEF ) 0.1 MG tablet Take 1/2 tablet daily. 12/18/23  Yes Thapa, Sudan, MD  fluticasone  (FLONASE ) 50 MCG/ACT nasal spray Place 1 spray into both nostrils daily as needed for allergies or rhinitis.   Yes [provider]  HORIZANT  600 MG TBCR Take 600 mg by mouth every evening.   Yes [provider]  loratadine (CLARITIN) 10 MG tablet Take 10 mg by mouth in the morning.   Yes [provider]  megestrol (MEGACE) 40 MG/ML suspension Take 400 mg by mouth daily with supper. 09/07/23  Yes [provider]  methylPREDNISolone  (MEDROL ) 4 MG tablet as directed Orally 1 tablet AM and 1/2 tablet afternoon and take additional in case of illness as instructed. 12/18/23  Yes Thapa, Sudan, MD  metroNIDAZOLE (METROGEL) 0.75 % gel Apply 1 Application topically 2 (two) times daily. 12/01/23  Yes [provider]  Multiple Vitamin (MULTIVITAMIN) tablet Take 1 tablet by mouth in the morning.   Yes [provider]  oxyCODONE -acetaminophen  (PERCOCET/ROXICET) 5-325 MG tablet Take 1 tablet by mouth every 4 (four) hours as needed. 12/12/23  Yes [provider]  Polyethyl Glycol-Propyl Glycol (SYSTANE OP) Place 2 drops into both eyes 2 (two) times daily as needed (for dry eyes).   Yes [provider]  polyethylene glycol (MIRALAX ) 17 g packet Take 17 g by mouth 2 (two) times daily. 17 grams in 6 oz of favorite drink twice a  day until bowel movement.  LAXITIVE.  Restart if two days since last bowel movement 08/24/20  Yes Shepperson, Kirstin, PA-C  Probiotic Product (PROBIOTIC PO) Take 2 tablets by mouth in the morning.   Yes [provider]  tiZANidine  (ZANAFLEX ) 4 MG tablet Take 8 mg by mouth at bedtime. 10/31/23  Yes [provider]  traZODone  (DESYREL ) 100 MG tablet Take 100 mg by mouth at bedtime. 11/13/23  Yes [provider]  amLODipine  (NORVASC ) 2.5 MG tablet Take 1 tablet (2.5 mg total) by mouth daily. Patient not taking: Reported on 12/22/2023 08/29/22   Setzer, Sandra J, PA-C  clonazePAM  (KLONOPIN ) 0.5 MG tablet Take 1 tablet (0.5 mg total) by mouth at bedtime as needed for anxiety. Patient not taking:  Reported on 12/22/2023 08/04/22   Gayland Lauraine PARAS, NP  fluconazole  (DIFLUCAN ) 100 MG tablet Take 200 mg x1 and then  100 mg daily- for thrush for an additional 6 days-for thrush Patient not taking: Reported on 12/22/2023 03/20/23   Lovorn, Megan, MD  ondansetron  (ZOFRAN ) 4 MG tablet Take 1 tablet (4 mg total) by mouth every 8 (eight) hours as needed for nausea or vomiting. Patient not taking: Reported on 12/22/2023 02/15/23   Lovorn, Megan, MD  ondansetron  (ZOFRAN ) 4 MG tablet Take 1 tablet (4 mg total) by mouth every 8 (eight) hours as needed for nausea or vomiting. Patient not taking: Reported on 12/22/2023 03/04/23   Cyrena Mylar, MD  tiZANidine  (ZANAFLEX ) 2 MG tablet Take 2 tablets (4 mg total) by mouth every 8 (eight) hours as needed for muscle spasms. Patient not taking: Reported on 12/22/2023 06/23/22   Joshua Alm Hamilton, MD  topiramate  (TOPAMAX ) 50 MG tablet Take 1 tablet (50 mg total) by mouth at bedtime. X 1 week, then 75 mg nightly x 1 week, ,then 100 mg at bedtime- for prevention of headaches Patient not taking: Reported on 12/22/2023 02/15/23   Lovorn, Megan, MD   Allergies  Allergen Reactions   Amoxicillin Other (See Comments)    Upset stomach   Aspirin      bruising    Azithromycin     stomach upset   Doxycycline Hyclate     GI upset   Escitalopram  Oxalate     Other Reaction(s): extreme side effects, dry mouth, insomnia, sweating.   Hydroxyzine     Other Reaction(s): Hallucinations, Other   Hydroxyzine Hcl Other (See Comments)   Metronidazole Diarrhea and Nausea Only   Buspirone  Palpitations   Buspirone  Hcl Palpitations   Mirtazapine Palpitations    Weight gain    Social History   Tobacco Use   Smoking status: Never   Smokeless tobacco: Never  Substance Use Topics   Alcohol use: Never    Family History  Problem Relation Age of Onset   Hypertension Mother    Stroke Mother    Heart attack Father    Tremor Brother      Review of Systems  Positive ROS: neg  All other systems have been reviewed and were otherwise negative with the exception of those mentioned in the HPI and as above.  Objective: Vital signs in last 24 hours: Temp:  [98.6 F (37 C)] 98.6 F (37 C) (11/21 0748) Pulse Rate:  [85] 85 (11/21 0748) Resp:  [18] 18 (11/21 0748) BP: (166)/(68) 166/68 (11/21 0748) SpO2:  [98 %] 98 % (11/21 0748) Weight:  [43.8 kg] 43.8 kg (11/21 0748)  General Appearance: Alert, cooperative, no distress, appears stated age Head: Normocephalic, without obvious abnormality, atraumatic Eyes: PERRL, conjunctiva/corneas clear, EOM's intact    Neck: Supple, symmetrical, trachea midline Back: Symmetric, no curvature, ROM normal, no CVA tenderness Lungs:  respirations unlabored Heart: Regular rate and rhythm Abdomen: Soft, non-tender Extremities: Extremities normal, atraumatic, no cyanosis or edema Pulses: 2+ and symmetric all extremities Skin: Skin color, texture, turgor normal, no rashes or lesions  NEUROLOGIC:   Mental status: Alert and oriented x4,  no aphasia, good attention span, fund of knowledge, and memory Motor Exam - grossly normal Sensory Exam - grossly normal Reflexes: 1+ Coordination - grossly normal Gait - grossly  normal Balance - grossly normal Cranial Nerves: I: smell Not tested  II: visual acuity  OS: nl    OD: nl  II: visual fields Full to confrontation  II: pupils Equal, round, reactive to light  III,VII: ptosis None  III,IV,VI: extraocular muscles  Full ROM  V: mastication Normal  V: facial light touch sensation  Normal  V,VII: corneal reflex  Present  VII: facial muscle function - upper  Normal  VII: facial muscle function - lower Normal  VIII: hearing Not tested  IX: soft palate elevation  Normal  IX,X: gag reflex Present  XI: trapezius strength  5/5  XI: sternocleidomastoid strength 5/5  XI: neck flexion strength  5/5  XII: tongue strength  Normal    Data Review Lab Results  Component Value Date   WBC 9.7 12/25/2023   HGB 12.1 12/25/2023   HCT 36.9 12/25/2023   MCV 99.2 12/25/2023   PLT 279 12/25/2023   Lab Results  Component Value Date   NA 134 (L) 12/15/2023   K 4.7 12/15/2023   CL 100 12/15/2023   CO2 26 12/15/2023   BUN 25 12/15/2023   CREATININE 1.00 12/15/2023   GLUCOSE 106 (H) 12/15/2023   Lab Results  Component Value Date   INR 0.9 06/09/2022    Assessment/Plan:  Estimated body mass index is 19.5 kg/m as calculated from the following:   Height as of this encounter: 4' 11 (1.499 m).   Weight as of this encounter: 43.8 kg. Patient admitted for decompression and fusion L1-3. Patient has failed a reasonable attempt at conservative therapy.  I explained the condition and procedure to the patient and answered any questions.  Patient wishes to proceed with procedure as planned. Understands risks/ benefits and typical outcomes of procedure.   Alm GORMAN Molt 12/29/2023 10:13 AM

## 2023-12-29 NOTE — Anesthesia Postprocedure Evaluation (Signed)
 Anesthesia Post Note  Patient: Katrina Rivera  Procedure(s) Performed: LAMINECTOMY WITH POSTERIOR LATERAL ARTHRODESIS LUMBAR ONE-TWO, LUMBAR TWO-THREE (Back)     Patient location during evaluation: PACU Anesthesia Type: General Level of consciousness: awake and alert Pain management: pain level controlled Vital Signs Assessment: post-procedure vital signs reviewed and stable Respiratory status: spontaneous breathing, nonlabored ventilation, respiratory function stable and patient connected to nasal cannula oxygen Cardiovascular status: blood pressure returned to baseline and stable Postop Assessment: no apparent nausea or vomiting Anesthetic complications: no   No notable events documented.  Last Vitals:  Vitals:   12/29/23 1430 12/29/23 1445  BP: (!) 184/70 (!) 174/72  Pulse: 85 82  Resp: 16 19  Temp:    SpO2: 97% 98%    Last Pain:  Vitals:   12/29/23 1445  TempSrc:   PainSc: 3                  Verlene Glantz S

## 2023-12-29 NOTE — Op Note (Signed)
 12/29/2023  1:26 PM  PATIENT:  Katrina Rivera  78 y.o. female  PRE-OPERATIVE DIAGNOSIS:  1.  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  Possible pseudoarthrosis L2-3  POST-OPERATIVE DIAGNOSIS:  same  PROCEDURE:   1. Decompressive lumbar laminectomy, hemi facetectomy and foraminotomy L1-2 on the left with discectomy 2. Posterior fixation L1-L3 using NuVasive cortical pedicle screws.  3. Intertransverse arthrodesis L1-L3 bilaterally using morcellized autograft and allograft. 4.  Removal of segmental fixation L2-L4 with exploration of fusion to assure solid arthrodesis at L2-3  SURGEON:  Alm Molt, MD  ASSISTANTS: Meyran FNP  ANESTHESIA:  General  EBL: 100 ml  Total I/O In: 1463 [I.V.:1113; IV Piggyback:350] Out: 100 [Blood:100]  BLOOD ADMINISTERED:none  DRAINS: none   INDICATION FOR PROCEDURE: This patient presented with back pain with bilateral leg pain.  She had undergone previous L4 to to S1 fusions. Imaging revealed possible pseudoarthrosis L2-3 and a large midline disc herniation L1-2 with spinal stenosis. The patient tried a reasonable attempt at conservative medical measures without relief. I recommended decompression and instrumented fusion to address the stenosis as well as the segmental  instability.  Patient understood the risks, benefits, and alternatives and potential outcomes and wished to proceed.  PROCEDURE DETAILS:  The patient was brought to the operating room. After induction of generalized endotracheal anesthesia the patient was rolled into the prone position on chest rolls and all pressure points were padded. The patient's lumbar region was cleaned and then prepped with DuraPrep and draped in the usual sterile fashion. Anesthesia was injected and then a dorsal midline incision was made and carried down to the lumbosacral fascia. The fascia was opened and the paraspinous musculature was taken down in a subperiosteal  fashion to expose the previously placed instrumentation as well as L1-2. A self-retaining retractor was placed. Intraoperative fluoroscopy confirmed my level, and I started removal of the old instrumentation.  I remove the locking caps from L2-L4 and remove the rods.  The screws appeared to have good purchase.  I did not see bridging bone between the transverse processes of L2 and L3.  With placement of the L1 cortical pedicle screws. The pedicle screw entry zones were identified utilizing surface landmarks and  AP and lateral fluoroscopy. I scored the cortex with the high-speed drill and then used the hand drill to drill an upward and outward direction into the pedicle. I then tapped line to line. I then placed a 5.5 x 40 mm cortical pedicle screw into the pedicles of L1 bilaterally.    I then turned my attention to the decompression and complete lumbar laminectomy, hemi- facetectomy, and foraminotomy was performed at L1-2 on the left.  . The yellow ligament was removed to expose the underlying dura and nerve roots, and generous foraminotomies were performed to adequately decompress the neural elements. Both the exiting and traversing nerve roots were decompressed until a coronary dilator passed easily along the nerve roots.  I then retracted the dura gently medially and found a large midline disc herniation.  I incised the disc base and performed a thorough intradiscal discectomy.  The annulus appeared to be sagging in the midline after that.  I palpated with a nerve hook and felt no more compression of the dura.   We then decorticated the transverse processes and laid a mixture of morcellized autograft and allograft out over these to perform intertransverse arthrodesis at L1-L3 bilaterally. We then placed lordotic rods into the multiaxial screw  heads of the pedicle screws and locked these in position with the locking caps and anti-torque device. We then checked our construct with AP and lateral fluoroscopy.  Irrigated with copious amounts of 0.5% povidone iodine  solution followed by saline solution. Inspected the nerve roots once again to assure adequate decompression, lined to the dura with Gelfoam,  and then we closed the muscle and the fascia with 0 Vicryl. Closed the subcutaneous tissues with 2-0 Vicryl and subcuticular tissues with 3-0 Vicryl. The skin was closed with benzoin and Steri-Strips. Dressing was then applied, the patient was awakened from general anesthesia and transported to the recovery room in stable condition. At the end of the procedure all sponge, needle and instrument counts were correct.   PLAN OF CARE: admit to inpatient  PATIENT DISPOSITION:  PACU - hemodynamically stable.   Delay start of Pharmacological VTE agent (>24hrs) due to surgical blood loss or risk of bleeding:  yes

## 2023-12-29 NOTE — TOC CM/SW Note (Signed)
 TOC consult received for possible post-op needs. CM to assess as appropriate. Therapy recs will be needed for placement/DME.  Merilee Batty, MSN, RN Case Management [phone]

## 2023-12-29 NOTE — Anesthesia Procedure Notes (Signed)
 Procedure Name: Intubation Date/Time: 12/29/2023 10:50 AM  Performed by: Coulton Schlink C, CRNAPre-anesthesia Checklist: Patient identified, Emergency Drugs available, Suction available and Patient being monitored Patient Re-evaluated:Patient Re-evaluated prior to induction Oxygen Delivery Method: Circle system utilized Preoxygenation: Pre-oxygenation with 100% oxygen Induction Type: IV induction Ventilation: Mask ventilation without difficulty Laryngoscope Size: Mac and 3 Grade View: Grade I Tube type: Oral Tube size: 7.0 mm Number of attempts: 1 Airway Equipment and Method: Stylet and Oral airway Placement Confirmation: ETT inserted through vocal cords under direct vision, positive ETCO2 and breath sounds checked- equal and bilateral Secured at: 22 cm Tube secured with: Tape Dental Injury: Teeth and Oropharynx as per pre-operative assessment

## 2023-12-29 NOTE — Transfer of Care (Signed)
 Immediate Anesthesia Transfer of Care Note  Patient: Katrina Rivera  Procedure(s) Performed: LAMINECTOMY WITH POSTERIOR LATERAL ARTHRODESIS LUMBAR ONE-TWO, LUMBAR TWO-THREE (Back)  Patient Location: PACU  Anesthesia Type:General  Level of Consciousness: awake, alert , and sedated  Airway & Oxygen Therapy: Patient Spontanous Breathing and Patient connected to face mask oxygen  Post-op Assessment: Report given to RN and Post -op Vital signs reviewed and stable  Post vital signs: Reviewed and stable  Last Vitals:  Vitals Value Taken Time  BP 172/81   Temp    Pulse 88 12/29/23 13:20  Resp 19 12/29/23 13:20  SpO2 100 % 12/29/23 13:20  Vitals shown include unfiled device data.  Last Pain:  Vitals:   12/29/23 0815  TempSrc:   PainSc: 0-No pain      Patients Stated Pain Goal: 1 (12/29/23 0754)  Complications: No notable events documented.

## 2023-12-30 NOTE — Progress Notes (Signed)
    Providing Compassionate, Quality Care - Together   NEUROSURGERY PROGRESS NOTE     S: No issues overnight.    O: EXAM:  BP (!) 141/61 (BP Location: Left Arm)   Pulse 77   Temp 98.7 F (37.1 C)   Resp 18   Ht 4' 11 (1.499 m)   Wt 43.8 kg   SpO2 99%   BMI 19.50 kg/m     Awake, alert, oriented  Speech fluent, appropriate  Strength/sensation grossly intact BUE/BLE Dressing c/d/I Hemovac in place   ASSESSMENT:  78 y.o. s/p revision/extension lumbar fusion    PLAN: -Therapies as tolerated -Continue drain -Continue supportive care   Camie Pickle, [facility]

## 2023-12-30 NOTE — Progress Notes (Signed)
 Orthopedic Tech Progress Note Patient Details:  Katrina Rivera Nov 07, 1945 [id number] Dropped off LSO brace per request from PT.  Ortho Devices Type of Ortho Device: Lumbar corsett Ortho Device/Splint Interventions: Ordered   Post Interventions Instructions Provided: Adjustment of device, Care of device, Poper ambulation with device  [name] 12/30/2023, 8:10 AM

## 2023-12-30 NOTE — Evaluation (Signed)
 Physical Therapy Evaluation Patient Details Name: Katrina Rivera MRN: [id number] DOB: Mar 31, 1945 Today's Date: 12/30/2023  History of Present Illness  78 y.o. female admitted 12/29/23 for back and leg pain. 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 Pt s/p decompressive lumbar laminectomy, hemi facetectomy and foraminotomy L1-2 on the left with discectomy and posterior fixation L1-L3. PMHx: addison's disease, anemia, anxiety, arthritis, chronic back pain, depression, fibromyalgia, hypotension, IBS, MS, peripheral neuropathy, syncope, and spinal cord stimulator.   Clinical Impression  Pt admitted with above diagnosis. PTA, pt was modI for gait using RW. Pt required assistance with stairs and car transfers. She lives with her husband in a one story house 1 STE. Pt currently with functional limitations due to the deficits listed below (see PT Problem List). She required CGA for bed mobility using log roll technique, CGA for sit<>stand using RW, and CGA for gait using RW. Pt ambulated greater than a household distance with a step-through gait pattern. Educated pt on stair training in accordance with her home set-up. Pt completed 2 standard height steps using RW backwards/forwards one at a time with minA. Pt is currently limited by back pain, spinal brace, back precautions, decreased balance, generalized weakness (L>R), and decreased activity tolerance. Pt will benefit from acute skilled PT to increase her independence and safety with mobility to allow discharge. Recommend HHPT to increase strength, improve balance, decrease fall risk, advance activity tolerance, and optimize safety within the home environment.     If plan is discharge home, recommend the following: A little help with walking and/or transfers;A little help with bathing/dressing/bathroom;Assistance with cooking/housework;Assist for transportation;Help with stairs or ramp for entrance   Can travel by private vehicle         Equipment Recommendations None recommended by PT  Recommendations for Other Services       Functional Status Assessment Patient has had a recent decline in their functional status and demonstrates the ability to make significant improvements in function in a reasonable and predictable amount of time.     Precautions / Restrictions Precautions Precautions: Back;Fall Precaution Booklet Issued: Yes (comment) Recall of Precautions/Restrictions: Intact Precaution/Restrictions Comments: Hemovac Drain. Pt recalled 3/3 back precautions. Required Braces or Orthoses: Spinal Brace Spinal Brace: Lumbar corset;Applied in sitting position;Other (comment) Spinal Brace Comments: May remove when in bed; May ambulate to bathroom without brace; May Apply/Remove Brace While sitting; May remove brace to shower Restrictions Weight Bearing Restrictions Per Provider Order: No      Mobility  Bed Mobility Overal bed mobility: Needs Assistance Bed Mobility: Sit to Sidelying         Sit to sidelying: Contact guard assist General bed mobility comments: Pt greeted seated EOB. Cued pt through log roll technique. She returned to bed with CGA to aid in bringing BLE into bed.    Transfers Overall transfer level: Needs assistance Equipment used: Rolling walker (2 wheels) Transfers: Sit to/from Stand Sit to Stand: Contact guard assist           General transfer comment: Pt stood from various surfaces including lowest bed height, commode, and chair. Cued proper hand placement using RW. Pt had a tendency to keep BUE support on RW to push up. Good eccentric control.    Ambulation/Gait Ambulation/Gait assistance: Contact guard assist Gait Distance (Feet): 80 Feet (x2, seated rest break between bouts) Assistive device: Rolling walker (2 wheels) Gait Pattern/deviations: Step-through pattern, Decreased stride length Gait velocity: reduced Gait velocity interpretation: <1.8 ft/sec, indicate of risk for  recurrent falls   General Gait Details: Pt ambulated with a reciprocal gait pattern, even weight shift, and good foot clearence. Cues for proximity to RW. Pt maintained upright posture and navigated room, bathroom, and hallway well.  Stairs Stairs: Yes Stairs assistance: Min assist Stair Management: With walker, Backwards, Forwards, Step to pattern Number of Stairs: 2 General stair comments: Demonstrated stair technique using RW to serve as railing. Instructed pt to ascend with RLE and descend with LLE one step at a time. Cues for sequencing. MinA to stabilize front of RW and provide stability. Provided pt with handout on how to perform stairs with walker.  Wheelchair Mobility     Tilt Bed    Modified Rankin (Stroke Patients Only)       Balance Overall balance assessment: Needs assistance Sitting-balance support: Single extremity supported, Feet supported Sitting balance-Leahy Scale: Fair Sitting balance - Comments: Pt performed pericare with modI. Assist to get toilet paper to maintain back precautions.   Standing balance support: Bilateral upper extremity supported, During functional activity, Reliant on assistive device for balance, No upper extremity supported Standing balance-Leahy Scale: Poor Standing balance comment: Pt dependent on RW. Able to wash hands standing at sink.                             Pertinent Vitals/Pain Pain Assessment Pain Assessment: Faces Faces Pain Scale: Hurts little more Pain Location: back Pain Descriptors / Indicators: Pressure Pain Intervention(s): Monitored during session, Limited activity within patient's tolerance, Repositioned    Home Living Family/patient expects to be discharged to:: Private residence Living Arrangements: Spouse/significant other Available Help at Discharge: Family;Available 24 hours/day Type of Home: House Home Access: Stairs to enter Entrance Stairs-Rails: None Entrance Stairs-Number of Steps: 1    Home Layout: One level Home Equipment: Teacher, English As A Foreign Language (2 wheels);Cane - single point;Shower seat;Tub bench;Grab bars - tub/shower;Hand held shower head Additional Comments: Uses BSC in bedroom    Prior Function Prior Level of Function : Independent/Modified Independent;Needs assist       Physical Assist : Mobility (physical);ADLs (physical) Mobility (physical): Stairs ADLs (physical): Bathing;IADLs Mobility Comments: Ambulates using RW. Denies fall hx. Husband assists with stair entry and car transfers. ADLs Comments: Spouse assists with bathing and dressing. Uses BSC in bedroom.     Extremity/Trunk Assessment   Upper Extremity Assessment Upper Extremity Assessment: Defer to OT evaluation    Lower Extremity Assessment Lower Extremity Assessment: Generalized weakness    Cervical / Trunk Assessment Cervical / Trunk Assessment: Back Surgery  Communication   Communication Communication: No apparent difficulties    Cognition Arousal: Alert Behavior During Therapy: WFL for tasks assessed/performed   PT - Cognitive impairments: No apparent impairments                       PT - Cognition Comments: Pt A,Ox4 Following commands: Intact       Cueing Cueing Techniques: Verbal cues, Visual cues     General Comments General comments (skin integrity, edema, etc.): Pt educated on back precautions and functional implications of precautions on ADL tasks. Pt receptive to information, stated she previously had not followed prior back precautions after previous surgeries. Handout provided. Husband present for session and actively engaged in Pt care.    Exercises     Assessment/Plan    PT Assessment Patient needs continued PT services  PT Problem List Decreased strength;Decreased activity tolerance;Decreased balance;Decreased mobility;Decreased knowledge of use of DME;Decreased  safety awareness;Pain       PT Treatment Interventions DME instruction;Gait  training;Stair training;Functional mobility training;Therapeutic activities;Therapeutic exercise;Balance training;Neuromuscular re-education;Patient/family education    PT Goals (Current goals can be found in the Care Plan section)  Acute Rehab PT Goals Patient Stated Goal: Return Home PT Goal Formulation: With patient Time For Goal Achievement: 01/13/24 Potential to Achieve Goals: Good    Frequency Min 5X/week     Co-evaluation               AM-PAC PT 6 Clicks Mobility  Outcome Measure Help needed turning from your back to your side while in a flat bed without using bedrails?: A Little Help needed moving from lying on your back to sitting on the side of a flat bed without using bedrails?: A Little Help needed moving to and from a bed to a chair (including a wheelchair)?: A Little Help needed standing up from a chair using your arms (e.g., wheelchair or bedside chair)?: A Little Help needed to walk in hospital room?: A Little Help needed climbing 3-5 steps with a railing? : A Little 6 Click Score: 18    End of Session Equipment Utilized During Treatment: Back brace;Gait belt Activity Tolerance: Patient tolerated treatment well Patient left: in bed;with call bell/phone within reach;with bed alarm set Nurse Communication: Mobility status PT Visit Diagnosis: Difficulty in walking, not elsewhere classified (R26.2);Other abnormalities of gait and mobility (R26.89);Unsteadiness on feet (R26.81);Muscle weakness (generalized) (M62.81)    Time: 8378-8350 PT Time Calculation (min) (ACUTE ONLY): 28 min   Charges:   PT Evaluation $PT Eval Moderate Complexity: 1 Mod PT Treatments $Gait Training: 8-22 mins PT General Charges $$ ACUTE PT VISIT: 1 Visit         Randall SAUNDERS, PT, DPT Acute Rehabilitation Services Office: [phone] Secure Chat Preferred  Delon CHRISTELLA Callander 12/30/2023, 5:05 PM

## 2023-12-30 NOTE — Progress Notes (Signed)
 PT Cancellation Note  Patient Details Name: Katrina Rivera MRN: [id number] DOB: 10/04/1945   Cancelled Treatment:    Reason Eval/Treat Not Completed: Other (comment) (PT consult appreciated and chart reviewed. Pt needs spinal brace, which isn't present in room. Awaiting on clarification of type of back brace required and then Ortho Tech will deliever. Will follow-up for PT evaluation once spinal brace is present and as schedule permits.)  Randall SAUNDERS, PT, DPT Acute Rehabilitation Services Office: [phone] Secure Chat Preferred  Delon CHRISTELLA Callander 12/30/2023, 7:57 AM

## 2023-12-30 NOTE — Plan of Care (Signed)
  Problem: Health Behavior/Discharge Planning: Goal: Ability to manage health-related needs will improve Outcome: Progressing   Problem: Nutrition: Goal: Adequate nutrition will be maintained Outcome: Progressing   Problem: Coping: Goal: Level of anxiety will decrease Outcome: Progressing   Problem: Safety: Goal: Ability to remain free from injury will improve Outcome: Progressing   

## 2023-12-30 NOTE — Evaluation (Signed)
 Occupational Therapy Evaluation Patient Details Name: Katrina Rivera MRN: [id number] DOB: Dec 04, 1945 Today's Date: 12/30/2023   History of Present Illness   78 y.o. female admitted 12/29/23 for back and leg pain. 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 Pt s/p decompressive lumbar laminectomy, hemi facetectomy and foraminotomy L1-2 on the left with discectomy and posterior fixation L1-L3. PMHx: addison's disease, anemia, anxiety, arthritis, chronic back pain, depression, fibromyalgia, hypotension, IBS, MS, peripheral neuropathy, syncope, and spinal cord stimulator.     Clinical Impressions PTA Pt required assistance for functional mobility with a RW and for ADL tasks. Pt spouse provided physical assistance for stairs, transfers, bathing, and dressing. Pt currently requires up to Max A for ADL engagement and up to Min A for mobility. Pt is primarily limited by back precautions, pain, generalized weakness, decreased safety awareness, and decreased activity tolerance. OT to continue to follow Pt acutely to facilitate progress towards goals. Pt will benefit from [facility] services at d/c to maximize occupational independence and decrease burden of care.      If plan is discharge home, recommend the following:   A little help with walking and/or transfers;A lot of help with bathing/dressing/bathroom;Assistance with cooking/housework;Assist for transportation;Help with stairs or ramp for entrance     Functional Status Assessment   Patient has had a recent decline in their functional status and demonstrates the ability to make significant improvements in function in a reasonable and predictable amount of time.     Equipment Recommendations   None recommended by OT (Pt has necessary equipment)     Recommendations for Other Services         Precautions/Restrictions   Precautions Precautions: Back;Fall Precaution Booklet Issued: Yes (comment) Recall of  Precautions/Restrictions: Intact Required Braces or Orthoses: Spinal Brace Spinal Brace: Lumbar corset;Applied in sitting position Spinal Brace Comments: May remove when in bed; May ambulate to bathroom without brace; May Apply/Remove Brace While sitting; May remove brace to shower Restrictions Weight Bearing Restrictions Per Provider Order: No     Mobility Bed Mobility Overal bed mobility: Needs Assistance Bed Mobility: Supine to Sit     Supine to sit: Min assist     General bed mobility comments: Min A for supine to sit transfer. Pt states that she has an adjustable bed and plans on raising HOB at home for mobility. Pt turned body towards R EOB keeping trunk in line with hips. Pt required Min HHA to scoot towards EOB.    Transfers Overall transfer level: Needs assistance Equipment used: Rolling walker (2 wheels) Transfers: Sit to/from Stand, Bed to chair/wheelchair/BSC Sit to Stand: Contact guard assist     Step pivot transfers: Contact guard assist     General transfer comment: Pt requires CGA for sit to stand and step pivot transfer to the R. Pt required one initial verbal cue for proper hand placement on RW.      Balance Overall balance assessment: Needs assistance Sitting-balance support: Single extremity supported, Feet supported Sitting balance-Leahy Scale: Fair Sitting balance - Comments: Pt utilized at least one UE to maintain sitting balance   Standing balance support: Bilateral upper extremity supported, During functional activity, Reliant on assistive device for balance Standing balance-Leahy Scale: Poor Standing balance comment: dependent on RW and CGA                           ADL either performed or assessed with clinical judgement   ADL Overall ADL's :  Needs assistance/impaired Eating/Feeding: Set up;Sitting   Grooming: Supervision/safety;Sitting   Upper Body Bathing: Contact guard assist   Lower Body Bathing: Maximal assistance    Upper Body Dressing : Contact guard assist   Lower Body Dressing: Maximal assistance   Toilet Transfer: Contact guard assist;Ambulation;BSC/3in1;Rolling walker (2 wheels)   Toileting- Clothing Manipulation and Hygiene: Moderate assistance               Vision Patient Visual Report: No change from baseline Vision Assessment?: No apparent visual deficits     Perception         Praxis         Pertinent Vitals/Pain Pain Assessment Pain Assessment: Faces Faces Pain Scale: Hurts little more Pain Location: back Pain Descriptors / Indicators: Pressure Pain Intervention(s): Limited activity within patient's tolerance, Monitored during session, Repositioned     Extremity/Trunk Assessment Upper Extremity Assessment Upper Extremity Assessment: Generalized weakness   Lower Extremity Assessment Lower Extremity Assessment: Defer to PT evaluation   Cervical / Trunk Assessment Cervical / Trunk Assessment: Back Surgery   Communication Communication Communication: No apparent difficulties   Cognition Arousal: Alert Behavior During Therapy: WFL for tasks assessed/performed Cognition: No apparent impairments                               Following commands: Intact       Cueing  General Comments   Cueing Techniques: Verbal cues;Visual cues  Pt educated on back precautions and functional implications of precautions on ADL tasks. Pt receptive to information, stated she previously had not followed prior back precautions after previous surgeries. Handout provided. Husband present for session and actively engaged in Pt care.   Exercises     Shoulder Instructions      Home Living Family/patient expects to be discharged to:: Private residence Living Arrangements: Spouse/significant other Available Help at Discharge: Family;Available 24 hours/day Type of Home: House Home Access: Stairs to enter [employer] of Steps: 1 Entrance Stairs-Rails:  None Home Layout: One level     Bathroom Shower/Tub: Chief Strategy Officer: Standard Bathroom Accessibility: No   Home Equipment: Teacher, English As A Foreign Language (2 wheels);Cane - single point;Shower seat;Tub bench;Grab bars - tub/shower;Hand held shower head   Additional Comments: Uses BSC in bedroom      Prior Functioning/Environment Prior Level of Function : Needs assist       Physical Assist : Mobility (physical);ADLs (physical) Mobility (physical): Gait;Transfers;Stairs ADLs (physical): Bathing;IADLs Mobility Comments: Uses RW and husband assists with stair entry and transfers ADLs Comments: Spouse assists with bathing and dressing. Uses BSC in bedroom    OT Problem List: Decreased strength;Decreased activity tolerance;Impaired balance (sitting and/or standing);Decreased safety awareness;Decreased knowledge of use of DME or AE;Decreased knowledge of precautions;Pain   OT Treatment/Interventions: Self-care/ADL training;Therapeutic exercise;Energy conservation;DME and/or AE instruction;Therapeutic activities;Patient/family education;Balance training      OT Goals(Current goals can be found in the care plan section)   Acute Rehab OT Goals Patient Stated Goal: to get [facility] services OT Goal Formulation: With patient Time For Goal Achievement: 01/13/24 Potential to Achieve Goals: Good ADL Goals Pt Will Perform Lower Body Bathing: with modified independence;with adaptive equipment Pt Will Perform Upper Body Dressing: with min assist (donning lumbar corset) Pt Will Perform Lower Body Dressing: with min assist;with adaptive equipment;sit to/from stand Pt Will Transfer to Toilet: with modified independence;ambulating Additional ADL Goal #1: Pt will engage in bed mobility with supervision while adhering to spinal precautions to  facilitate engagement in OOB ADLs   OT Frequency:  Min 5X/week    Co-evaluation              AM-PAC OT 6 Clicks Daily Activity      Outcome Measure Help from another person eating meals?: A Little Help from another person taking care of personal grooming?: A Little Help from another person toileting, which includes using toliet, bedpan, or urinal?: A Lot Help from another person bathing (including washing, rinsing, drying)?: A Lot Help from another person to put on and taking off regular upper body clothing?: A Little Help from another person to put on and taking off regular lower body clothing?: A Lot 6 Click Score: 15   End of Session Equipment Utilized During Treatment: Gait belt;Rolling walker (2 wheels);Back brace Nurse Communication: Mobility status  Activity Tolerance: Patient tolerated treatment well Patient left: in chair;with call bell/phone within reach;with chair alarm set  OT Visit Diagnosis: Unsteadiness on feet (R26.81);Muscle weakness (generalized) (M62.81);History of falling (Z91.81);Pain Pain - part of body:  (back)                Time: 8559-8474 OT Time Calculation (min): 45 min Charges:  OT General Charges $OT Visit: 1 Visit OT Evaluation $OT Eval Moderate Complexity: 1 Mod OT Treatments $Self Care/Home Management : 23-37 mins  Maurilio CROME, OTR/L.  [facility] Acute Rehabilitation  Office: [phone]   Maurilio PARAS Obdulia Steier 12/30/2023, 4:42 PM

## 2023-12-30 NOTE — Progress Notes (Signed)
 OT Cancellation Note  Patient Details Name: Katrina Rivera MRN: [id number] DOB: [birth year]   Cancelled Treatment:    Reason Eval/Treat Not Completed: Other (comment) Pt with order for spinal brace application but no brace present in room. Awaiting clarification regarding type of brace required and delivery from [facility] once clarified. OT to follow up as appropriate and as schedule allows.   Maurilio CROME, OTR/LSABRA  [facility] Acute Rehabilitation  Office: [phone]   Maurilio PARAS Atleigh Gruen 12/30/2023, 8:01 AM

## 2023-12-31 MED ORDER — METHOCARBAMOL 500 MG PO TABS: 500.0000 mg | ORAL_TABLET | Freq: Four times a day (QID) | ORAL | 2 refills | Status: AC | PRN

## 2023-12-31 MED ORDER — OXYCODONE HCL 5 MG PO TABS: 5.0000 mg | ORAL_TABLET | ORAL | 0 refills | Status: AC | PRN

## 2023-12-31 NOTE — TOC Transition Note (Signed)
 Transition of Care [facility]) - Discharge Note   Patient Details  Name: Katrina Rivera MRN: [id number] Date of Birth: 10/28/1945  Transition of Care [facility]) CM/SW Contact:  Robynn Eileen Hoose, RN Phone Number: 12/31/2023, 10:52 AM   Clinical Narrative:   Patient is being discharged today. Spoke with patient, confirmed she has BSC/3:1 and RW. Patient requesting new wheelchair, current one does not work properly and she has had it for over 20 years.  HH recommendations noted, referral sent to [facility], pt has used them in the past and would like to use them again. Referral sent to [facility] with [facility], can service patient but delayed by a week. Patient is ok with waiting a week for them to start care. Contact information placed on AVS.    Final next level of care: Home w Home Health Services Barriers to Discharge: No Barriers Identified   Patient Goals and CMS Choice            Discharge Placement                       Discharge Plan and Services Additional resources added to the After Visit Summary for                  DME Arranged: Wheelchair manual DME Agency: [employer] Date DME Agency Contacted: 12/31/23 Time DME Agency Contacted: 1051 Representative spoke with at DME Agency: [place]            Social Drivers of Health (SDOH) Interventions SDOH Screenings   Food Insecurity: No Food Insecurity (12/30/2023)  Housing: Low Risk  (12/30/2023)  Transportation Needs: No Transportation Needs (12/30/2023)  Utilities: Not At Risk (12/30/2023)  Depression (PHQ2-9): Low Risk  (03/20/2023)  Social Connections: Patient Declined (12/30/2023)  Tobacco Use: Low Risk  (12/29/2023)     Readmission Risk Interventions    03/08/2023    1:22 PM  Readmission Risk Prevention Plan  Transportation Screening Complete  PCP or Specialist Appt within 3-5 Days Complete  HRI or Home Care Consult Complete  Social Work Consult for Recovery Care Planning/Counseling  Complete  Palliative Care Screening Not Applicable  Medication Review Oceanographer) Complete

## 2023-12-31 NOTE — Care Management (Cosign Needed)
    Durable Medical Equipment  (From admission, onward)           Start     Ordered   12/31/23 1048  For home use only DME standard manual wheelchair with seat cushion  Once       Comments: Patient suffers from Spinal fusion which impairs their ability to perform daily activities like bathing, dressing, feeding, grooming, and toileting in the home.  A cane, crutch, or walker will not resolve issue with performing activities of daily living. A wheelchair will allow patient to safely perform daily activities. Patient can safely propel the wheelchair in the home or has a caregiver who can provide assistance. Length of need 6 months . Accessories: elevating leg rests (ELRs), wheel locks, extensions and anti-tippers.   12/31/23 1048   12/29/23 1659  DME Walker rolling  Once       Question:  Patient needs a walker to treat with the following condition  Answer:  S/P lumbar fusion   12/29/23 1658   12/29/23 1659  DME 3 n 1  Once        12/29/23 1658

## 2023-12-31 NOTE — Discharge Summary (Signed)
 Patient ID: Katrina Rivera MRN: [id number] DOB/AGE: 1945-10-11 78 y.o.  Admit date: 12/29/2023 Discharge date: 12/31/2023  Admission Diagnoses: Radiculopathy, lumbar region [M54.16] S/P lumbar fusion [Z98.1]   Discharge Diagnoses: Same   Discharged Condition: Stable  Hospital Course:  Katrina Rivera is a 78 y.o. female who was admitted following a revision/extension lumbar fusion and discectomy. They were recovered in PACU and transferred to the floor. Hospital course was uncomplicated. Pt stable for discharge today. Pt to f/u in office for routine post op visit. Pt is in agreement w/ plan.    Discharge Exam: Blood pressure (!) 181/87, pulse 96, temperature 98.1 F (36.7 C), temperature source Oral, resp. rate 17, height 4' 11 (1.499 m), weight 43.8 kg, SpO2 99%. A&O Speech fluent, appropriate Strength/sensation grossly intact BUE/BLE.  Dressing c/d/I.   Disposition: Discharge disposition: 01-Home or Self Care       Discharge Instructions     If the dressing is still on your incision site when you go home, remove it on the third day after your surgery date. Remove dressing if it begins to fall off, or if it is dirty or damaged before the third day.   Complete by: As directed       Allergies as of 12/31/2023       Reactions   Amoxicillin Other (See Comments)   Upset stomach   Aspirin     bruising   Azithromycin    stomach upset   Doxycycline Hyclate    GI upset   Escitalopram  Oxalate    Other Reaction(s): extreme side effects, dry mouth, insomnia, sweating.   Hydroxyzine    Other Reaction(s): Hallucinations, Other   Hydroxyzine Hcl Other (See Comments)   Metronidazole Diarrhea, Nausea Only   Buspirone  Palpitations   Buspirone  Hcl Palpitations   Mirtazapine Palpitations   Weight gain        Medication List     STOP taking these medications    oxyCODONE -acetaminophen  5-325 MG tablet Commonly known as: PERCOCET/ROXICET       TAKE these  medications    acetaminophen  325 MG tablet Commonly known as: TYLENOL  Take 1-2 tablets (325-650 mg total) by mouth every 4 (four) hours as needed for mild pain.   alfuzosin  10 MG 24 hr tablet Commonly known as: UROXATRAL  Take 10 mg by mouth daily with breakfast.   amLODipine  2.5 MG tablet Commonly known as: NORVASC  Take 1 tablet (2.5 mg total) by mouth daily.   b complex vitamins capsule Take 1 capsule by mouth in the morning and at bedtime.   bethanechol  10 MG tablet Commonly known as: URECHOLINE  Take 10 mg by mouth 2 (two) times daily.   busPIRone  5 MG tablet Commonly known as: BUSPAR  Take 5 mg by mouth 2 (two) times daily.   cholecalciferol  25 MCG (1000 UNIT) tablet Commonly known as: VITAMIN D3 Take 1,000 Units by mouth every morning.   clobetasol  ointment 0.05 % Commonly known as: TEMOVATE  Apply 1 Application topically 2 (two) times daily.   clonazePAM  0.5 MG tablet Commonly known as: KLONOPIN  Take 1 tablet (0.5 mg total) by mouth at bedtime as needed for anxiety.   CRANBERRY PO Take 1 tablet by mouth in the morning.   cycloSPORINE  0.05 % ophthalmic emulsion Commonly known as: RESTASIS  Place 1 drop into both eyes 2 (two) times daily.   esomeprazole 40 MG capsule Commonly known as: NEXIUM Take 1 capsule by mouth daily.   ferrous sulfate  325 (65 FE) MG tablet Take 325 mg by mouth  daily with breakfast.   fluconazole  100 MG tablet Commonly known as: Diflucan  Take 200 mg x1 and then  100 mg daily- for thrush for an additional 6 days-for thrush   fludrocortisone  0.1 MG tablet Commonly known as: FLORINEF  Take 1/2 tablet daily.   fluticasone  50 MCG/ACT nasal spray Commonly known as: FLONASE  Place 1 spray into both nostrils daily as needed for allergies or rhinitis.   Horizant  600 MG Tbcr Generic drug: Gabapentin  Enacarbil Take 600 mg by mouth every evening.   loratadine 10 MG tablet Commonly known as: CLARITIN Take 10 mg by mouth in the morning.    megestrol 40 MG/ML suspension Commonly known as: MEGACE Take 400 mg by mouth daily with supper.   methocarbamol  500 MG tablet Commonly known as: ROBAXIN  Take 1 tablet (500 mg total) by mouth every 6 (six) hours as needed for muscle spasms.   methylPREDNISolone  4 MG tablet Commonly known as: MEDROL  as directed Orally 1 tablet AM and 1/2 tablet afternoon and take additional in case of illness as instructed.   metroNIDAZOLE 0.75 % gel Commonly known as: METROGEL Apply 1 Application topically 2 (two) times daily.   multivitamin tablet Take 1 tablet by mouth in the morning.   ondansetron  4 MG tablet Commonly known as: Zofran  Take 1 tablet (4 mg total) by mouth every 8 (eight) hours as needed for nausea or vomiting.   ondansetron  4 MG tablet Commonly known as: ZOFRAN  Take 1 tablet (4 mg total) by mouth every 8 (eight) hours as needed for nausea or vomiting.   oxyCODONE  5 MG immediate release tablet Commonly known as: Oxy IR/ROXICODONE  Take 1 tablet (5 mg total) by mouth every 3 (three) hours as needed for moderate pain (pain score 4-6).   polyethylene glycol 17 g packet Commonly known as: MiraLax  Take 17 g by mouth 2 (two) times daily. 17 grams in 6 oz of favorite drink twice a day until bowel movement.  LAXITIVE.  Restart if two days since last bowel movement   PROBIOTIC PO Take 2 tablets by mouth in the morning.   SYSTANE OP Place 2 drops into both eyes 2 (two) times daily as needed (for dry eyes).   tiZANidine  2 MG tablet Commonly known as: ZANAFLEX  Take 2 tablets (4 mg total) by mouth every 8 (eight) hours as needed for muscle spasms. What changed: Another medication with the same name was removed. Continue taking this medication, and follow the directions you see here.   topiramate  50 MG tablet Commonly known as: Topamax  Take 1 tablet (50 mg total) by mouth at bedtime. X 1 week, then 75 mg nightly x 1 week, ,then 100 mg at bedtime- for prevention of headaches    traZODone  100 MG tablet Commonly known as: DESYREL  Take 100 mg by mouth at bedtime.               Durable Medical Equipment  (From admission, onward)           Start     Ordered   12/29/23 1659  DME Walker rolling  Once       Question:  Patient needs a walker to treat with the following condition  Answer:  S/P lumbar fusion   12/29/23 1658   12/29/23 1659  DME 3 n 1  Once        12/29/23 1658              Discharge Care Instructions  (From admission, onward)  Start     Ordered   12/31/23 0000  If the dressing is still on your incision site when you go home, remove it on the third day after your surgery date. Remove dressing if it begins to fall off, or if it is dirty or damaged before the third day.        12/31/23 1013             Signed: CAMIE CAYLIN Mabeline Varas 12/31/2023, 10:14 AM

## 2023-12-31 NOTE — Plan of Care (Signed)
   Problem: Clinical Measurements: Goal: Ability to maintain clinical measurements within normal limits will improve Outcome: Progressing   Problem: Coping: Goal: Level of anxiety will decrease Outcome: Progressing   Problem: Safety: Goal: Ability to remain free from injury will improve Outcome: Progressing   Problem: Skin Integrity: Goal: Risk for impaired skin integrity will decrease Outcome: Progressing

## 2023-12-31 NOTE — Progress Notes (Signed)
 Physical Therapy Treatment Patient Details Name: Katrina Rivera MRN: [id number] DOB: 1945-10-06 Today's Date: 12/31/2023   History of Present Illness 78 y.o. female admitted 12/29/23 for back and leg pain. 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 Pt s/p decompressive lumbar laminectomy, hemi facetectomy and foraminotomy L1-2 on the left with discectomy and posterior fixation L1-L3. PMHx: addison's disease, anemia, anxiety, arthritis, chronic back pain, depression, fibromyalgia, hypotension, IBS, MS, peripheral neuropathy, syncope, and spinal cord stimulator.    PT Comments  Pt greeted supine in bed, pleasant and agreeable to PT session. She donned LSO with cues and up to minA while seated EOB. Pt recalled 3/3 back precautions and adhered to them throughout mobility. She ambulated with a reduced gait speed. Pt took a prolonged seated rest break prior to stairs. Demonstrated the RW backwards/forwards technique to her husband and educated him on where he should be positioned to support her. Allowed pt's husband to have hands-on practice with pt on stairs with cues for sequencing. Pt completed one step and c/o nausea. 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 Assessed vitals and all were stable, RN notified. Will continue to follow acutely and advance appropriately.     If plan is discharge home, recommend the following: A little help with walking and/or transfers;A little help with bathing/dressing/bathroom;Assistance with cooking/housework;Assist for transportation;Help with stairs or ramp for entrance   Can travel by private vehicle        Equipment Recommendations  None recommended by PT    Recommendations for Other Services       Precautions / Restrictions Precautions Precautions: Back;Fall Precaution Booklet Issued: Yes (comment) Recall of Precautions/Restrictions: Intact Required Braces or Orthoses: Spinal Brace Spinal Brace: Lumbar corset;Applied in sitting  position;Other (comment) Spinal Brace Comments: May remove when in bed; May ambulate to bathroom without brace; May Apply/Remove Brace While sitting; May remove brace to shower Restrictions Weight Bearing Restrictions Per Provider Order: No     Mobility  Bed Mobility Overal bed mobility: Needs Assistance Bed Mobility: Rolling, Sidelying to Sit Rolling: Contact guard assist Sidelying to sit: Min assist       General bed mobility comments: Pt recalled log roll techniques. Cues for improved sequencing. CGA to achieve R sidelying. Pt brought BLE off EOB. Assist to elevate trunk.    Transfers Overall transfer level: Needs assistance Equipment used: Rolling walker (2 wheels) Transfers: Sit to/from Stand, Bed to chair/wheelchair/BSC Sit to Stand: Contact guard assist   Step pivot transfers: Contact guard assist       General transfer comment: Cues for proper hand placement using RW. Powered up with light assist. Transferred to recliner chair. Good eccentric control.    Ambulation/Gait Ambulation/Gait assistance: Contact guard assist Gait Distance (Feet): 80 Feet Assistive device: Rolling walker (2 wheels) Gait Pattern/deviations: Step-through pattern, Decreased stride length Gait velocity: decreased Gait velocity interpretation: <1.31 ft/sec, indicative of household ambulator   General Gait Details: Pt took short slow steps. Her steps were symmetrical. Pt with decreased gait speed compared to yesterday. She maintained upright posture with close proximity to RW.   Stairs Stairs: Yes Stairs assistance: Contact guard assist Stair Management: With walker, Backwards, Forwards, Step to pattern Number of Stairs: 1 General stair comments: Pt recalled stair training education. Cued pt's husband on where he should be positioned to support her. She stepped up 1 standard height step backwards and began c/o nausea. She went down the step forwards. Deferred further stair training and  returned pt to her room  in recliner chair.   Wheelchair Mobility     Tilt Bed    Modified Rankin (Stroke Patients Only)       Balance Overall balance assessment: Needs assistance Sitting-balance support: Single extremity supported, Feet supported Sitting balance-Leahy Scale: Fair     Standing balance support: Bilateral upper extremity supported, During functional activity, Reliant on assistive device for balance, No upper extremity supported Standing balance-Leahy Scale: Poor Standing balance comment: Pt dependent on RW.                            Communication Communication Communication: No apparent difficulties  Cognition Arousal: Alert Behavior During Therapy: WFL for tasks assessed/performed   PT - Cognitive impairments: No apparent impairments                         Following commands: Intact      Cueing Cueing Techniques: Verbal cues, Visual cues  Exercises      General Comments General comments (skin integrity, edema, etc.): Pt greeted soiled in bed. She was able to complete pericare with set-up assist. Changed bed pad and gown. Pt donned LSO brace with cues and minA. Pt c/o nausea during stair training. VSS on RA.      Pertinent Vitals/Pain Pain Assessment Pain Assessment: Faces Faces Pain Scale: Hurts little more Pain Location: Back Pain Descriptors / Indicators: Pressure Pain Intervention(s): Monitored during session, Limited activity within patient's tolerance, Repositioned    Home Living                          Prior Function            PT Goals (current goals can now be found in the care plan section) Acute Rehab PT Goals Patient Stated Goal: Return Home PT Goal Formulation: With patient Time For Goal Achievement: 01/13/24 Potential to Achieve Goals: Good Progress towards PT goals: Progressing toward goals    Frequency    Min 5X/week      PT Plan      Co-evaluation              AM-PAC  PT 6 Clicks Mobility   Outcome Measure  Help needed turning from your back to your side while in a flat bed without using bedrails?: A Little Help needed moving from lying on your back to sitting on the side of a flat bed without using bedrails?: A Little Help needed moving to and from a bed to a chair (including a wheelchair)?: A Little Help needed standing up from a chair using your arms (e.g., wheelchair or bedside chair)?: A Little Help needed to walk in hospital room?: A Little Help needed climbing 3-5 steps with a railing? : A Little 6 Click Score: 18    End of Session Equipment Utilized During Treatment: Back brace;Gait belt Activity Tolerance: Patient tolerated treatment well Patient left: in chair;with call bell/phone within reach;with family/visitor present Nurse Communication: Mobility status;Other (comment) (pt's nausea) PT Visit Diagnosis: Difficulty in walking, not elsewhere classified (R26.2);Other abnormalities of gait and mobility (R26.89);Unsteadiness on feet (R26.81);Muscle weakness (generalized) (M62.81)     Time: 8772-8696 PT Time Calculation (min) (ACUTE ONLY): 36 min  Charges:    $Gait Training: 23-37 mins PT General Charges $$ ACUTE PT VISIT: 1 Visit  Randall SAUNDERS, PT, DPT Acute Rehabilitation Services Office: [phone] Secure Chat Preferred  Delon CHRISTELLA Callander 12/31/2023, 2:50 PM

## 2024-01-02 ENCOUNTER — Encounter (HOSPITAL_COMMUNITY): Payer: Self-pay | Admitting: Neurological Surgery

## 2024-01-06 DIAGNOSIS — G4700 Insomnia, unspecified: Secondary | ICD-10-CM | POA: Diagnosis not present

## 2024-01-06 DIAGNOSIS — G35D Multiple sclerosis, unspecified: Secondary | ICD-10-CM | POA: Diagnosis not present

## 2024-01-06 DIAGNOSIS — E785 Hyperlipidemia, unspecified: Secondary | ICD-10-CM | POA: Diagnosis not present

## 2024-01-06 DIAGNOSIS — J302 Other seasonal allergic rhinitis: Secondary | ICD-10-CM | POA: Diagnosis not present

## 2024-01-06 DIAGNOSIS — M797 Fibromyalgia: Secondary | ICD-10-CM | POA: Diagnosis not present

## 2024-01-06 DIAGNOSIS — G2581 Restless legs syndrome: Secondary | ICD-10-CM | POA: Diagnosis not present

## 2024-01-06 DIAGNOSIS — F32A Depression, unspecified: Secondary | ICD-10-CM | POA: Diagnosis not present

## 2024-01-06 DIAGNOSIS — M810 Age-related osteoporosis without current pathological fracture: Secondary | ICD-10-CM | POA: Diagnosis not present

## 2024-01-06 DIAGNOSIS — I951 Orthostatic hypotension: Secondary | ICD-10-CM | POA: Diagnosis not present

## 2024-01-06 DIAGNOSIS — G8929 Other chronic pain: Secondary | ICD-10-CM | POA: Diagnosis not present

## 2024-01-06 DIAGNOSIS — D631 Anemia in chronic kidney disease: Secondary | ICD-10-CM | POA: Diagnosis not present

## 2024-01-06 DIAGNOSIS — G629 Polyneuropathy, unspecified: Secondary | ICD-10-CM | POA: Diagnosis not present

## 2024-01-06 DIAGNOSIS — M5116 Intervertebral disc disorders with radiculopathy, lumbar region: Secondary | ICD-10-CM | POA: Diagnosis not present

## 2024-01-06 DIAGNOSIS — K219 Gastro-esophageal reflux disease without esophagitis: Secondary | ICD-10-CM | POA: Diagnosis not present

## 2024-01-06 DIAGNOSIS — I129 Hypertensive chronic kidney disease with stage 1 through stage 4 chronic kidney disease, or unspecified chronic kidney disease: Secondary | ICD-10-CM | POA: Diagnosis not present

## 2024-01-06 DIAGNOSIS — Z79899 Other long term (current) drug therapy: Secondary | ICD-10-CM | POA: Diagnosis not present

## 2024-01-06 DIAGNOSIS — N183 Chronic kidney disease, stage 3 unspecified: Secondary | ICD-10-CM | POA: Diagnosis not present

## 2024-01-06 DIAGNOSIS — M069 Rheumatoid arthritis, unspecified: Secondary | ICD-10-CM | POA: Diagnosis not present

## 2024-01-06 DIAGNOSIS — M1990 Unspecified osteoarthritis, unspecified site: Secondary | ICD-10-CM | POA: Diagnosis not present

## 2024-01-06 DIAGNOSIS — E271 Primary adrenocortical insufficiency: Secondary | ICD-10-CM | POA: Diagnosis not present

## 2024-01-06 DIAGNOSIS — R1310 Dysphagia, unspecified: Secondary | ICD-10-CM | POA: Diagnosis not present

## 2024-01-06 DIAGNOSIS — K589 Irritable bowel syndrome without diarrhea: Secondary | ICD-10-CM | POA: Diagnosis not present

## 2024-01-06 DIAGNOSIS — Z4789 Encounter for other orthopedic aftercare: Secondary | ICD-10-CM | POA: Diagnosis not present

## 2024-01-06 DIAGNOSIS — F419 Anxiety disorder, unspecified: Secondary | ICD-10-CM | POA: Diagnosis not present

## 2024-01-11 ENCOUNTER — Other Ambulatory Visit

## 2024-01-11 DIAGNOSIS — M5416 Radiculopathy, lumbar region: Secondary | ICD-10-CM | POA: Diagnosis not present

## 2024-01-15 ENCOUNTER — Ambulatory Visit: Admitting: Endocrinology

## 2024-02-14 ENCOUNTER — Other Ambulatory Visit (HOSPITAL_COMMUNITY): Payer: Self-pay | Admitting: Neurological Surgery

## 2024-02-14 DIAGNOSIS — M5416 Radiculopathy, lumbar region: Secondary | ICD-10-CM

## 2024-02-19 ENCOUNTER — Encounter (HOSPITAL_COMMUNITY): Payer: Self-pay

## 2024-02-19 ENCOUNTER — Ambulatory Visit (HOSPITAL_COMMUNITY)
Admission: RE | Admit: 2024-02-19 | Discharge: 2024-02-19 | Disposition: A | Discharge status: Home / Self Care | Source: Ambulatory Visit | Attending: Neurological Surgery | Admitting: Neurological Surgery

## 2024-02-19 DIAGNOSIS — M5126 Other intervertebral disc displacement, lumbar region: Secondary | ICD-10-CM | POA: Diagnosis not present

## 2024-02-19 DIAGNOSIS — M5416 Radiculopathy, lumbar region: Secondary | ICD-10-CM | POA: Diagnosis present

## 2024-02-19 MED ORDER — GADOBUTROL 1 MMOL/ML IV SOLN
4.0000 mL | Freq: Once | INTRAVENOUS | Status: AC | PRN
  Administered 2024-02-19: 4 mL via INTRAVENOUS

## 2024-03-15 ENCOUNTER — Telehealth: Payer: Self-pay | Admitting: Neurology

## 2024-03-15 DIAGNOSIS — G35D Multiple sclerosis, unspecified: Secondary | ICD-10-CM

## 2024-03-15 DIAGNOSIS — G43709 Chronic migraine without aura, not intractable, without status migrainosus: Secondary | ICD-10-CM

## 2024-03-15 DIAGNOSIS — R269 Unspecified abnormalities of gait and mobility: Secondary | ICD-10-CM

## 2024-03-15 NOTE — Telephone Encounter (Signed)
 Pt called to request  for a referral  for Physical Therapy sent to  [facility]

## 2024-03-15 NOTE — Telephone Encounter (Signed)
Orders Placed This Encounter  Procedures  . Ambulatory referral to Physical Therapy      

## 2024-03-15 NOTE — Telephone Encounter (Signed)
 Dr. Onita see the below, okay for the below.

## 2024-03-26 ENCOUNTER — Ambulatory Visit: Admitting: Neurology

## 2024-04-15 ENCOUNTER — Ambulatory Visit: Admitting: Endocrinology
